# Patient Record
Sex: Male | Born: 1945
Health system: Southern US, Community
[De-identification: ages and names within clinical notes are randomized; demographics above are authoritative.]

## PROBLEM LIST (undated history)

## (undated) DIAGNOSIS — I10 Essential (primary) hypertension: Secondary | ICD-10-CM

## (undated) DIAGNOSIS — I2510 Atherosclerotic heart disease of native coronary artery without angina pectoris: Secondary | ICD-10-CM

## (undated) DIAGNOSIS — K219 Gastro-esophageal reflux disease without esophagitis: Secondary | ICD-10-CM

## (undated) DIAGNOSIS — K760 Fatty (change of) liver, not elsewhere classified: Secondary | ICD-10-CM

## (undated) DIAGNOSIS — E781 Pure hyperglyceridemia: Secondary | ICD-10-CM

## (undated) DIAGNOSIS — R972 Elevated prostate specific antigen [PSA]: Secondary | ICD-10-CM

## (undated) DIAGNOSIS — I200 Unstable angina: Secondary | ICD-10-CM

## (undated) DIAGNOSIS — E1110 Type 2 diabetes mellitus with ketoacidosis without coma: Secondary | ICD-10-CM

## (undated) DIAGNOSIS — E119 Type 2 diabetes mellitus without complications: Secondary | ICD-10-CM

## (undated) DIAGNOSIS — G709 Myoneural disorder, unspecified: Secondary | ICD-10-CM

## (undated) DIAGNOSIS — C61 Malignant neoplasm of prostate: Secondary | ICD-10-CM

## (undated) DIAGNOSIS — M1990 Unspecified osteoarthritis, unspecified site: Secondary | ICD-10-CM

## (undated) DIAGNOSIS — G8929 Other chronic pain: Secondary | ICD-10-CM

## (undated) DIAGNOSIS — Z923 Personal history of irradiation: Secondary | ICD-10-CM

## (undated) DIAGNOSIS — H04129 Dry eye syndrome of unspecified lacrimal gland: Secondary | ICD-10-CM

## (undated) DIAGNOSIS — M549 Dorsalgia, unspecified: Secondary | ICD-10-CM

## (undated) HISTORY — DX: Pure hyperglyceridemia: E78.1

## (undated) HISTORY — PX: CARDIAC SURGERY: SHX584

## (undated) HISTORY — PX: APPENDECTOMY: SHX54

## (undated) HISTORY — PX: PROSTATE BIOPSY: SHX241

---
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
## 2000-07-23 ENCOUNTER — Encounter: Payer: Self-pay | Admitting: Internal Medicine

## 2000-07-23 ENCOUNTER — Ambulatory Visit (HOSPITAL_COMMUNITY): Admission: RE | Admit: 2000-07-23 | Discharge: 2000-07-23 | Payer: Self-pay | Admitting: Internal Medicine

## 2001-07-25 ENCOUNTER — Ambulatory Visit (HOSPITAL_COMMUNITY): Admission: RE | Admit: 2001-07-25 | Discharge: 2001-07-25 | Payer: Self-pay | Admitting: Family Medicine

## 2001-07-25 ENCOUNTER — Encounter: Payer: Self-pay | Admitting: Internal Medicine

## 2001-09-07 ENCOUNTER — Emergency Department (HOSPITAL_COMMUNITY): Admission: EM | Admit: 2001-09-07 | Discharge: 2001-09-07 | Payer: Self-pay | Admitting: Emergency Medicine

## 2001-09-07 ENCOUNTER — Encounter: Payer: Self-pay | Admitting: Emergency Medicine

## 2002-07-11 ENCOUNTER — Encounter: Payer: Self-pay | Admitting: Internal Medicine

## 2002-07-11 ENCOUNTER — Ambulatory Visit (HOSPITAL_COMMUNITY): Admission: RE | Admit: 2002-07-11 | Discharge: 2002-07-11 | Payer: Self-pay | Admitting: Internal Medicine

## 2003-10-17 ENCOUNTER — Ambulatory Visit (HOSPITAL_COMMUNITY): Admission: RE | Admit: 2003-10-17 | Discharge: 2003-10-17 | Payer: Self-pay | Admitting: Internal Medicine

## 2003-11-03 ENCOUNTER — Emergency Department (HOSPITAL_COMMUNITY): Admission: EM | Admit: 2003-11-03 | Discharge: 2003-11-03 | Payer: Self-pay | Admitting: Family Medicine

## 2004-05-15 ENCOUNTER — Ambulatory Visit (HOSPITAL_COMMUNITY): Admission: RE | Admit: 2004-05-15 | Discharge: 2004-05-15 | Payer: Self-pay | Admitting: Internal Medicine

## 2008-01-24 ENCOUNTER — Ambulatory Visit (HOSPITAL_COMMUNITY): Admission: RE | Admit: 2008-01-24 | Discharge: 2008-01-24 | Payer: Self-pay | Admitting: Internal Medicine

## 2008-02-01 ENCOUNTER — Ambulatory Visit (HOSPITAL_COMMUNITY): Admission: RE | Admit: 2008-02-01 | Discharge: 2008-02-01 | Payer: Self-pay | Admitting: Internal Medicine

## 2008-02-02 ENCOUNTER — Ambulatory Visit (HOSPITAL_COMMUNITY): Admission: RE | Admit: 2008-02-02 | Discharge: 2008-02-02 | Payer: Self-pay | Admitting: Internal Medicine

## 2008-02-15 ENCOUNTER — Encounter: Admission: RE | Admit: 2008-02-15 | Discharge: 2008-02-15 | Payer: Self-pay | Admitting: Orthopedic Surgery

## 2008-02-19 ENCOUNTER — Encounter: Admission: RE | Admit: 2008-02-19 | Discharge: 2008-02-19 | Payer: Self-pay | Admitting: Orthopedic Surgery

## 2009-04-25 ENCOUNTER — Ambulatory Visit (HOSPITAL_COMMUNITY): Admission: RE | Admit: 2009-04-25 | Discharge: 2009-04-26 | Payer: Self-pay | Admitting: Internal Medicine

## 2009-10-12 ENCOUNTER — Encounter: Admission: RE | Admit: 2009-10-12 | Discharge: 2009-10-12 | Payer: Self-pay | Admitting: Orthopedic Surgery

## 2010-01-02 ENCOUNTER — Encounter (HOSPITAL_COMMUNITY): Admission: RE | Admit: 2010-01-02 | Discharge: 2010-03-18 | Payer: Self-pay | Admitting: Internal Medicine

## 2010-02-12 HISTORY — PX: COLONOSCOPY: SHX174

## 2010-02-12 LAB — HM COLONOSCOPY

## 2010-03-25 ENCOUNTER — Ambulatory Visit (HOSPITAL_COMMUNITY): Admission: RE | Admit: 2010-03-25 | Discharge: 2010-03-25 | Payer: Self-pay | Admitting: Internal Medicine

## 2010-07-10 ENCOUNTER — Encounter (HOSPITAL_COMMUNITY)
Admission: RE | Admit: 2010-07-10 | Discharge: 2010-09-13 | Payer: Self-pay | Source: Home / Self Care | Attending: Internal Medicine | Admitting: Internal Medicine

## 2011-01-09 ENCOUNTER — Institutional Professional Consult (permissible substitution): Payer: Self-pay | Admitting: Cardiology

## 2011-02-05 ENCOUNTER — Telehealth: Payer: Self-pay | Admitting: Cardiology

## 2011-02-05 NOTE — Telephone Encounter (Signed)
LOV faxed to [facility] @ [phone]  02/05/11/km

## 2011-02-05 NOTE — Telephone Encounter (Signed)
Their was no REcords faxed on this PT to [facility]/ PT has No Records  02/05/11/km

## 2011-02-09 ENCOUNTER — Emergency Department (HOSPITAL_COMMUNITY): Payer: BC Managed Care – PPO

## 2011-02-09 ENCOUNTER — Emergency Department (HOSPITAL_COMMUNITY)
Admission: EM | Admit: 2011-02-09 | Discharge: 2011-02-09 | Disposition: A | Discharge status: Home / Self Care | Payer: BC Managed Care – PPO | Attending: Emergency Medicine | Admitting: Emergency Medicine

## 2011-02-09 DIAGNOSIS — I10 Essential (primary) hypertension: Secondary | ICD-10-CM | POA: Insufficient documentation

## 2011-02-09 DIAGNOSIS — R143 Flatulence: Secondary | ICD-10-CM | POA: Insufficient documentation

## 2011-02-09 DIAGNOSIS — R142 Eructation: Secondary | ICD-10-CM | POA: Insufficient documentation

## 2011-02-09 DIAGNOSIS — R10819 Abdominal tenderness, unspecified site: Secondary | ICD-10-CM | POA: Insufficient documentation

## 2011-02-09 DIAGNOSIS — R109 Unspecified abdominal pain: Secondary | ICD-10-CM | POA: Insufficient documentation

## 2011-02-09 DIAGNOSIS — Q619 Cystic kidney disease, unspecified: Secondary | ICD-10-CM | POA: Insufficient documentation

## 2011-02-09 DIAGNOSIS — E119 Type 2 diabetes mellitus without complications: Secondary | ICD-10-CM | POA: Insufficient documentation

## 2011-02-09 DIAGNOSIS — M25519 Pain in unspecified shoulder: Secondary | ICD-10-CM | POA: Insufficient documentation

## 2011-02-09 DIAGNOSIS — R079 Chest pain, unspecified: Secondary | ICD-10-CM | POA: Insufficient documentation

## 2011-02-09 DIAGNOSIS — R141 Gas pain: Secondary | ICD-10-CM | POA: Insufficient documentation

## 2011-02-09 DIAGNOSIS — Z79899 Other long term (current) drug therapy: Secondary | ICD-10-CM | POA: Insufficient documentation

## 2011-02-09 LAB — URINALYSIS, ROUTINE W REFLEX MICROSCOPIC
Bilirubin Urine: NEGATIVE
Glucose, UA: 100 mg/dL — AB
Protein, ur: NEGATIVE mg/dL
Specific Gravity, Urine: 1.018 (ref 1.005–1.030)
pH: 6 (ref 5.0–8.0)

## 2011-02-09 LAB — COMPREHENSIVE METABOLIC PANEL
ALT: 44 U/L (ref 0–53)
Albumin: 3.7 g/dL (ref 3.5–5.2)
Alkaline Phosphatase: 71 U/L (ref 39–117)
BUN: 16 mg/dL (ref 6–23)
Chloride: 106 mEq/L (ref 96–112)
Total Bilirubin: 0.3 mg/dL (ref 0.3–1.2)
Total Protein: 7.1 g/dL (ref 6.0–8.3)

## 2011-02-09 LAB — DIFFERENTIAL
Eosinophils Relative: 2 % (ref 0–5)
Lymphocytes Relative: 32 % (ref 12–46)
Lymphs Abs: 2.5 10*3/uL (ref 0.7–4.0)
Neutro Abs: 4.5 10*3/uL (ref 1.7–7.7)

## 2011-02-09 LAB — CBC
Hemoglobin: 14 g/dL (ref 13.0–17.0)
MCHC: 33.7 g/dL (ref 30.0–36.0)
MCV: 87.6 fL (ref 78.0–100.0)
RBC: 4.74 MIL/uL (ref 4.22–5.81)
RDW: 12.5 % (ref 11.5–15.5)
WBC: 7.9 10*3/uL (ref 4.0–10.5)

## 2011-02-09 LAB — TROPONIN I: Troponin I: <0.3 ng/mL (ref <0.30)

## 2011-02-09 MED ORDER — IOHEXOL 300 MG/ML  SOLN
100.0000 mL | Freq: Once | INTRAMUSCULAR | Status: AC | PRN
  Administered 2011-02-09: 100 mL via INTRAVENOUS

## 2011-02-16 ENCOUNTER — Other Ambulatory Visit: Payer: Self-pay | Admitting: Internal Medicine

## 2011-02-16 DIAGNOSIS — R935 Abnormal findings on diagnostic imaging of other abdominal regions, including retroperitoneum: Secondary | ICD-10-CM

## 2011-02-21 ENCOUNTER — Ambulatory Visit
Admission: RE | Admit: 2011-02-21 | Discharge: 2011-02-21 | Disposition: A | Discharge status: Home / Self Care | Payer: BC Managed Care – PPO | Source: Ambulatory Visit | Attending: Internal Medicine | Admitting: Internal Medicine

## 2011-02-21 DIAGNOSIS — R935 Abnormal findings on diagnostic imaging of other abdominal regions, including retroperitoneum: Secondary | ICD-10-CM

## 2011-02-21 MED ORDER — GADOBENATE DIMEGLUMINE 529 MG/ML IV SOLN
15.0000 mL | Freq: Once | INTRAVENOUS | Status: AC | PRN
  Administered 2011-02-21: 15 mL via INTRAVENOUS

## 2011-06-24 ENCOUNTER — Other Ambulatory Visit (HOSPITAL_COMMUNITY): Payer: Self-pay | Admitting: Internal Medicine

## 2011-06-24 ENCOUNTER — Ambulatory Visit (HOSPITAL_COMMUNITY)
Admission: RE | Admit: 2011-06-24 | Discharge: 2011-06-24 | Disposition: A | Discharge status: Home / Self Care | Payer: BC Managed Care – PPO | Source: Ambulatory Visit | Attending: Internal Medicine | Admitting: Internal Medicine

## 2011-06-24 DIAGNOSIS — R072 Precordial pain: Secondary | ICD-10-CM

## 2011-06-24 DIAGNOSIS — R079 Chest pain, unspecified: Secondary | ICD-10-CM | POA: Insufficient documentation

## 2011-06-24 DIAGNOSIS — Z0000 Encounter for general adult medical examination without abnormal findings: Secondary | ICD-10-CM | POA: Insufficient documentation

## 2011-06-24 DIAGNOSIS — F17200 Nicotine dependence, unspecified, uncomplicated: Secondary | ICD-10-CM | POA: Insufficient documentation

## 2011-10-09 ENCOUNTER — Other Ambulatory Visit (HOSPITAL_COMMUNITY): Payer: Self-pay | Admitting: Internal Medicine

## 2011-10-09 DIAGNOSIS — R7989 Other specified abnormal findings of blood chemistry: Secondary | ICD-10-CM

## 2011-10-09 DIAGNOSIS — R5383 Other fatigue: Secondary | ICD-10-CM

## 2011-10-27 ENCOUNTER — Encounter (HOSPITAL_COMMUNITY)
Admission: RE | Admit: 2011-10-27 | Discharge: 2011-10-27 | Disposition: A | Discharge status: Home / Self Care | Payer: BC Managed Care – PPO | Source: Ambulatory Visit | Attending: Internal Medicine | Admitting: Internal Medicine

## 2011-10-27 DIAGNOSIS — R946 Abnormal results of thyroid function studies: Secondary | ICD-10-CM | POA: Insufficient documentation

## 2011-10-27 DIAGNOSIS — R7989 Other specified abnormal findings of blood chemistry: Secondary | ICD-10-CM

## 2011-10-27 DIAGNOSIS — R5383 Other fatigue: Secondary | ICD-10-CM

## 2011-10-27 DIAGNOSIS — R5381 Other malaise: Secondary | ICD-10-CM | POA: Insufficient documentation

## 2011-10-28 ENCOUNTER — Encounter (HOSPITAL_COMMUNITY)
Admission: RE | Admit: 2011-10-28 | Discharge: 2011-10-28 | Disposition: A | Discharge status: Home / Self Care | Payer: BC Managed Care – PPO | Source: Ambulatory Visit | Attending: Internal Medicine | Admitting: Internal Medicine

## 2011-10-28 MED ORDER — SODIUM PERTECHNETATE TC 99M INJECTION
10.5000 | Freq: Once | INTRAVENOUS | Status: AC | PRN
  Administered 2011-10-28: 10.5 via INTRAVENOUS

## 2011-10-28 MED ORDER — SODIUM IODIDE I 131 CAPSULE: 5.6000 | Freq: Once | INTRAVENOUS | Status: AC | PRN

## 2012-04-01 ENCOUNTER — Other Ambulatory Visit (HOSPITAL_COMMUNITY): Payer: Self-pay | Admitting: Internal Medicine

## 2012-04-01 DIAGNOSIS — R7989 Other specified abnormal findings of blood chemistry: Secondary | ICD-10-CM

## 2012-04-07 ENCOUNTER — Encounter (HOSPITAL_COMMUNITY)
Admission: RE | Admit: 2012-04-07 | Discharge: 2012-04-07 | Disposition: A | Discharge status: Home / Self Care | Payer: BC Managed Care – PPO | Source: Ambulatory Visit | Attending: Internal Medicine | Admitting: Internal Medicine

## 2012-04-07 DIAGNOSIS — R7989 Other specified abnormal findings of blood chemistry: Secondary | ICD-10-CM

## 2012-04-07 DIAGNOSIS — E0590 Thyrotoxicosis, unspecified without thyrotoxic crisis or storm: Secondary | ICD-10-CM | POA: Insufficient documentation

## 2012-04-08 ENCOUNTER — Encounter (HOSPITAL_COMMUNITY)
Admission: RE | Admit: 2012-04-08 | Discharge: 2012-04-08 | Disposition: A | Discharge status: Home / Self Care | Payer: BC Managed Care – PPO | Source: Ambulatory Visit | Attending: Internal Medicine | Admitting: Internal Medicine

## 2012-04-08 ENCOUNTER — Encounter (HOSPITAL_COMMUNITY): Payer: Self-pay

## 2012-04-08 MED ORDER — SODIUM PERTECHNETATE TC 99M INJECTION
11.0000 | Freq: Once | INTRAVENOUS | Status: AC | PRN
  Administered 2012-04-08: 11 via INTRAVENOUS

## 2012-04-08 MED ORDER — SODIUM IODIDE I 131 CAPSULE
8.0000 | Freq: Once | INTRAVENOUS | Status: AC | PRN
  Administered 2012-04-07: 8 via ORAL

## 2012-06-25 ENCOUNTER — Observation Stay (HOSPITAL_COMMUNITY)
Admission: EM | Admit: 2012-06-25 | Discharge: 2012-06-28 | DRG: 854 | Disposition: A | Discharge status: Home / Self Care | Payer: BC Managed Care – PPO | Attending: Internal Medicine | Admitting: Internal Medicine

## 2012-06-25 ENCOUNTER — Emergency Department (HOSPITAL_COMMUNITY): Payer: BC Managed Care – PPO

## 2012-06-25 ENCOUNTER — Encounter (HOSPITAL_COMMUNITY): Payer: Self-pay | Admitting: *Deleted

## 2012-06-25 DIAGNOSIS — E872 Acidosis, unspecified: Secondary | ICD-10-CM | POA: Diagnosis present

## 2012-06-25 DIAGNOSIS — K219 Gastro-esophageal reflux disease without esophagitis: Secondary | ICD-10-CM | POA: Diagnosis present

## 2012-06-25 DIAGNOSIS — Z23 Encounter for immunization: Secondary | ICD-10-CM

## 2012-06-25 DIAGNOSIS — I10 Essential (primary) hypertension: Secondary | ICD-10-CM | POA: Diagnosis present

## 2012-06-25 DIAGNOSIS — R079 Chest pain, unspecified: Secondary | ICD-10-CM

## 2012-06-25 DIAGNOSIS — E0590 Thyrotoxicosis, unspecified without thyrotoxic crisis or storm: Secondary | ICD-10-CM | POA: Diagnosis present

## 2012-06-25 DIAGNOSIS — E1169 Type 2 diabetes mellitus with other specified complication: Secondary | ICD-10-CM | POA: Diagnosis present

## 2012-06-25 DIAGNOSIS — Z9861 Coronary angioplasty status: Secondary | ICD-10-CM

## 2012-06-25 DIAGNOSIS — E785 Hyperlipidemia, unspecified: Secondary | ICD-10-CM

## 2012-06-25 DIAGNOSIS — I200 Unstable angina: Secondary | ICD-10-CM

## 2012-06-25 DIAGNOSIS — I2510 Atherosclerotic heart disease of native coronary artery without angina pectoris: Principal | ICD-10-CM

## 2012-06-25 DIAGNOSIS — Z7982 Long term (current) use of aspirin: Secondary | ICD-10-CM

## 2012-06-25 DIAGNOSIS — Z79899 Other long term (current) drug therapy: Secondary | ICD-10-CM

## 2012-06-25 DIAGNOSIS — Z955 Presence of coronary angioplasty implant and graft: Secondary | ICD-10-CM

## 2012-06-25 DIAGNOSIS — E119 Type 2 diabetes mellitus without complications: Secondary | ICD-10-CM | POA: Diagnosis present

## 2012-06-25 HISTORY — DX: Type 2 diabetes mellitus without complications: E11.9

## 2012-06-25 HISTORY — DX: Other chronic pain: G89.29

## 2012-06-25 HISTORY — DX: Dorsalgia, unspecified: M54.9

## 2012-06-25 HISTORY — DX: Essential (primary) hypertension: I10

## 2012-06-25 HISTORY — DX: Gastro-esophageal reflux disease without esophagitis: K21.9

## 2012-06-25 LAB — GLUCOSE, CAPILLARY: Glucose-Capillary: 286 mg/dL — ABNORMAL HIGH (ref 70–99)

## 2012-06-25 LAB — URINALYSIS, ROUTINE W REFLEX MICROSCOPIC
Glucose, UA: >1000 mg/dL — AB
Hgb urine dipstick: NEGATIVE
Leukocytes, UA: NEGATIVE
Protein, ur: NEGATIVE mg/dL
Specific Gravity, Urine: 1.019 (ref 1.005–1.030)
pH: 5.5 (ref 5.0–8.0)

## 2012-06-25 LAB — CBC
Hemoglobin: 14.8 g/dL (ref 13.0–17.0)
MCV: 85.7 fL (ref 78.0–100.0)
Platelets: 234 10*3/uL (ref 150–400)
RBC: 4.9 MIL/uL (ref 4.22–5.81)
WBC: 16.3 10*3/uL — ABNORMAL HIGH (ref 4.0–10.5)

## 2012-06-25 LAB — BASIC METABOLIC PANEL
Calcium: 9.8 mg/dL (ref 8.4–10.5)
Chloride: 101 mEq/L (ref 96–112)
GFR calc Af Amer: >90 mL/min (ref >90)
GFR calc Af Amer: 87 mL/min — ABNORMAL LOW (ref >90)
GFR calc non Af Amer: 75 mL/min — ABNORMAL LOW (ref >90)
Glucose, Bld: 343 mg/dL — ABNORMAL HIGH (ref 70–99)
Potassium: 4.4 mEq/L (ref 3.5–5.1)
Sodium: 135 mEq/L (ref 135–145)

## 2012-06-25 LAB — TROPONIN I: Troponin I: <0.3 ng/mL (ref <0.30)

## 2012-06-25 LAB — LIPID PANEL
Cholesterol: 195 mg/dL (ref 0–200)
LDL Cholesterol: 126 mg/dL — ABNORMAL HIGH (ref 0–99)
VLDL: 22 mg/dL (ref 0–40)

## 2012-06-25 LAB — HEPARIN LEVEL (UNFRACTIONATED): Heparin Unfractionated: 0.16 IU/mL — ABNORMAL LOW (ref 0.30–0.70)

## 2012-06-25 LAB — LACTIC ACID, PLASMA: Lactic Acid, Venous: 2.7 mmol/L — ABNORMAL HIGH (ref 0.5–2.2)

## 2012-06-25 LAB — PRO B NATRIURETIC PEPTIDE: Pro B Natriuretic peptide (BNP): 168.4 pg/mL — ABNORMAL HIGH (ref 0–125)

## 2012-06-25 LAB — POCT I-STAT TROPONIN I: Troponin i, poc: 0.04 ng/mL (ref 0.00–0.08)

## 2012-06-25 LAB — MAGNESIUM: Magnesium: 2 mg/dL (ref 1.5–2.5)

## 2012-06-25 LAB — URINE MICROSCOPIC-ADD ON

## 2012-06-25 LAB — PROTIME-INR: INR: 1.11 (ref 0.00–1.49)

## 2012-06-25 MED ORDER — SODIUM CHLORIDE 0.9 % IJ SOLN
3.0000 mL | Freq: Two times a day (BID) | INTRAMUSCULAR | Status: DC
  Administered 2012-06-25 – 2012-06-26 (×2): 3 mL via INTRAVENOUS

## 2012-06-25 MED ORDER — PANTOPRAZOLE SODIUM 40 MG PO TBEC
40.0000 mg | DELAYED_RELEASE_TABLET | Freq: Every day | ORAL | Status: DC
  Administered 2012-06-26 – 2012-06-28 (×3): 40 mg via ORAL
  Filled 2012-06-25 (×3): qty 1

## 2012-06-25 MED ORDER — INFLUENZA VIRUS VACC SPLIT PF IM SUSP
0.5000 mL | INTRAMUSCULAR | Status: AC
  Administered 2012-06-26: 0.5 mL via INTRAMUSCULAR
  Filled 2012-06-25: qty 0.5

## 2012-06-25 MED ORDER — DILTIAZEM HCL ER COATED BEADS 120 MG PO CP24
120.0000 mg | ORAL_CAPSULE | Freq: Every day | ORAL | Status: DC
  Administered 2012-06-25 – 2012-06-28 (×4): 120 mg via ORAL
  Filled 2012-06-25 (×3): qty 1

## 2012-06-25 MED ORDER — SODIUM CHLORIDE 0.9 % IV BOLUS (SEPSIS)
500.0000 mL | Freq: Once | INTRAVENOUS | Status: AC
  Administered 2012-06-25: 500 mL via INTRAVENOUS

## 2012-06-25 MED ORDER — ASPIRIN EC 81 MG PO TBEC
81.0000 mg | DELAYED_RELEASE_TABLET | Freq: Every day | ORAL | Status: DC
  Administered 2012-06-25 – 2012-06-26 (×2): 81 mg via ORAL
  Filled 2012-06-25 (×3): qty 1

## 2012-06-25 MED ORDER — HEPARIN BOLUS VIA INFUSION
4000.0000 [IU] | Freq: Once | INTRAVENOUS | Status: AC
  Administered 2012-06-25: 4000 [IU] via INTRAVENOUS
  Filled 2012-06-25: qty 4000

## 2012-06-25 MED ORDER — HEPARIN (PORCINE) IN NACL 100-0.45 UNIT/ML-% IJ SOLN
1500.0000 [IU]/h | INTRAMUSCULAR | Status: DC
  Administered 2012-06-25: 1500 [IU]/h via INTRAVENOUS
  Administered 2012-06-25: 1250 [IU]/h via INTRAVENOUS
  Administered 2012-06-27: 1500 [IU]/h via INTRAVENOUS
  Filled 2012-06-25 (×6): qty 250

## 2012-06-25 MED ORDER — NABUMETONE 750 MG PO TABS
750.0000 mg | ORAL_TABLET | Freq: Two times a day (BID) | ORAL | Status: DC
  Administered 2012-06-25 – 2012-06-27 (×4): 750 mg via ORAL
  Filled 2012-06-25 (×5): qty 1

## 2012-06-25 MED ORDER — ATENOLOL 100 MG PO TABS
100.0000 mg | ORAL_TABLET | Freq: Every day | ORAL | Status: DC
  Administered 2012-06-25 – 2012-06-28 (×4): 100 mg via ORAL
  Filled 2012-06-25 (×4): qty 1

## 2012-06-25 MED ORDER — ASPIRIN 81 MG PO TABS: 81.0000 mg | ORAL_TABLET | Freq: Every day | ORAL | Status: DC

## 2012-06-25 MED ORDER — DILTIAZEM HCL 60 MG PO TABS
120.0000 mg | ORAL_TABLET | Freq: Every day | ORAL | Status: DC
  Administered 2012-06-25: 120 mg via ORAL

## 2012-06-25 MED ORDER — ACETAMINOPHEN 650 MG RE SUPP: 650.0000 mg | Freq: Four times a day (QID) | RECTAL | Status: DC | PRN

## 2012-06-25 MED ORDER — ENOXAPARIN SODIUM 40 MG/0.4ML ~~LOC~~ SOLN
40.0000 mg | SUBCUTANEOUS | Status: DC
  Filled 2012-06-25: qty 0.4

## 2012-06-25 MED ORDER — INSULIN ASPART 100 UNIT/ML ~~LOC~~ SOLN
0.0000 [IU] | Freq: Three times a day (TID) | SUBCUTANEOUS | Status: DC
  Administered 2012-06-25 – 2012-06-26 (×3): 5 [IU] via SUBCUTANEOUS
  Administered 2012-06-26 – 2012-06-27 (×3): 3 [IU] via SUBCUTANEOUS
  Administered 2012-06-28: 2 [IU] via SUBCUTANEOUS

## 2012-06-25 MED ORDER — ATORVASTATIN CALCIUM 80 MG PO TABS
80.0000 mg | ORAL_TABLET | Freq: Every day | ORAL | Status: DC
  Administered 2012-06-25 – 2012-06-27 (×3): 80 mg via ORAL
  Filled 2012-06-25 (×5): qty 1

## 2012-06-25 MED ORDER — HEPARIN BOLUS VIA INFUSION
3000.0000 [IU] | Freq: Once | INTRAVENOUS | Status: AC
  Administered 2012-06-25: 3000 [IU] via INTRAVENOUS
  Filled 2012-06-25: qty 3000

## 2012-06-25 MED ORDER — DILTIAZEM HCL 60 MG PO TABS: 120.0000 mg | ORAL_TABLET | Freq: Every day | ORAL | Status: DC

## 2012-06-25 MED ORDER — SODIUM CHLORIDE 0.9 % IJ SOLN: 3.0000 mL | Freq: Two times a day (BID) | INTRAMUSCULAR | Status: DC

## 2012-06-25 MED ORDER — DILTIAZEM HCL 60 MG PO TABS
120.0000 mg | ORAL_TABLET | Freq: Four times a day (QID) | ORAL | Status: DC
  Filled 2012-06-25 (×3): qty 2

## 2012-06-25 MED ORDER — ACETAMINOPHEN 325 MG PO TABS: 650.0000 mg | ORAL_TABLET | Freq: Four times a day (QID) | ORAL | Status: DC | PRN

## 2012-06-25 MED ORDER — NITROGLYCERIN 0.4 MG SL SUBL: 0.4000 mg | SUBLINGUAL_TABLET | SUBLINGUAL | Status: DC | PRN

## 2012-06-25 MED ORDER — MORPHINE SULFATE 2 MG/ML IJ SOLN: 1.0000 mg | INTRAMUSCULAR | Status: DC | PRN

## 2012-06-25 MED ORDER — MONTELUKAST SODIUM 10 MG PO TABS
10.0000 mg | ORAL_TABLET | Freq: Every day | ORAL | Status: DC
  Administered 2012-06-25 – 2012-06-27 (×3): 10 mg via ORAL
  Filled 2012-06-25 (×5): qty 1

## 2012-06-25 MED ORDER — ASPIRIN 81 MG PO CHEW
324.0000 mg | CHEWABLE_TABLET | Freq: Once | ORAL | Status: AC
  Administered 2012-06-25: 324 mg via ORAL
  Filled 2012-06-25: qty 4

## 2012-06-25 MED ORDER — ENALAPRIL MALEATE 20 MG PO TABS
20.0000 mg | ORAL_TABLET | Freq: Every day | ORAL | Status: DC
  Administered 2012-06-25 – 2012-06-28 (×4): 20 mg via ORAL
  Filled 2012-06-25 (×4): qty 1

## 2012-06-25 NOTE — Consult Note (Addendum)
Admit date: 06/25/2012 Referring Physician Dr. Loistine Chance Primary Physician No primary provider on file. Primary Cardiologist  None Reason for Consultation  Chest pain  HPI: 66 -year-old male with diabetes, hypertension, GERD who recently quit smoking last year who presents the emergency room with chest discomfort. For the past few months he is noted increasing exertional chest discomfort which is substernal/upper epigastric in location with no radiation of symptoms but brought on by heavy/moderate exertion. For instance he states that he is unable to walk a city block without development of these symptoms. After resting, the symptoms subside. He had symptoms yesterday after working on his boat and they consistently persisted throughout the evening and was quite severe. He was quite concerned about this. Came to the emergency room for further evaluation. Currently he is chest pain-free. His EKG shows sinus rhythm without any ST segment changes. His first set of troponin was normal. Approximally 7 years ago he had a stress test done. His primary physician has sent in a referral for a cardiology evaluation he states.    PMH:   Past Medical History  Diagnosis Date  . Diabetes mellitus without complication   . Hypertension   . GERD (gastroesophageal reflux disease)   . Chronic back pain     PSH:   Past Surgical History  Procedure Date  . Appendectomy    Allergies:  Review of patient's allergies indicates no known allergies. Prior to Admit Meds:   (Not in a hospital admission) Fam HX:    Family History  Problem Relation Age of Onset  . Diabetes Sister    Social HX:    History   Social History  . Marital Status: Married    Spouse Name: N/A    Number of Children: 4  . Years of Education: 2 y colleg   Occupational History  . 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 Smoking status: Former Smoker -- 50 years    Quit date: 09/26/2011  . Smokeless tobacco: Not on  file  . Alcohol Use: No  . Drug Use: No  . Sexually Active: Not on file   Other Topics Concern  . Not on file   Social History Narrative   Works as      ROS:  Denies any fevers, chills, syncope, orthopnea. Increasing angina with exertion. All 11 ROS were addressed and are negative except what is stated in the HPI  Physical Exam: Blood pressure 115/76, pulse 61, temperature 97.7 F (36.5 C), temperature source Oral, resp. rate 20, height 5\' 10"  (1.778 m), weight 91.627 kg (202 lb), SpO2 99.00%.    General: Well developed, well nourished, in no acute distress Head: Eyes PERRLA, No xanthomas.   Normal cephalic and atramatic  Lungs:   Clear bilaterally to auscultation and percussion. Normal respiratory effort. No wheezes, no rales. Heart:   HRRR S1 S2 Pulses are 2+ & equal. No appreciable murmurs, somewhat distant heart sounds    No carotid bruit. No JVD.  No abdominal bruits. No femoral bruits. Abdomen: Bowel sounds are positive, abdomen soft and non-tender without masses. No hepatosplenomegaly. Obese, somewhat distended Msk:  Back normal. Normal strength and tone for age. Extremities:   No clubbing, cyanosis or edema.  DP +1 Neuro: Alert and oriented X 3, non-focal, MAE x 4 GU: Deferred Rectal: Deferred Psych:  Good affect, responds appropriately    Labs:   Lab Results  Component Value Date   WBC 16.3* 06/25/2012   HGB 14.8 06/25/2012  HCT 42.0 06/25/2012   MCV 85.7 06/25/2012   PLT 234 06/25/2012    Lab 06/25/12 0725  NA 137  K 4.4  CL 101  CO2 18*  BUN 15  CREATININE 0.90  CALCIUM 10.2  PROT --  BILITOT --  ALKPHOS --  ALT --  AST --  GLUCOSE 245*   No results found for this basename: PTT   No results found for this basename: INR, PROTIME   Troponin thus far normal.    Radiology:  Dg Chest Port 1 View  06/25/2012  *RADIOLOGY REPORT*  Clinical Data: Chest pain and shortness of breath  PORTABLE CHEST - 1 VIEW  Comparison: 06/24/2011  Findings: The  cardiomediastinal silhouette is within normal limits. Lungs are clear.  No pneumothorax.  IMPRESSION: Negative.   Original Report Authenticated By: Donavan Burnet, M.D.    Personally viewed.  EKG:  Sinus rhythm, no ST segment changes. Personally viewed.   ASSESSMENT/PLAN:   66 year old male with diabetes, hypertension, recent tobacco cessation with history concerning for unstable angina /progressive angina on exertion.  1. Angina-given his history, risk factors, symptoms of progressive worsening exertional chest discomfort relieved with rest I would like to proceed with cardiac catheterization to determine if he has coronary artery disease. I discussed procedure with him. I discussed risks and benefits including stroke, heart attack, death, bleeding. He understands and is willing to proceed. We'll place him on the board for Monday. If he has any worrisome symptoms throughout the weekend, we will proceed with catheterization on a more urgent basis. I will go ahead and order echocardiogram.  I would recommend IV heparin, continuation of beta blocker, statin, aspirin. Nitroglycerin as needed.  2. Hypertension-continue with multidrug regimen that he has been on since an outpatient. This includes ACE inhibitor.  3. Mildly decreased CO2-18-could there be evidence of a metabolic acidosis, per primary team.  4. Mild leukocytosis-this may be secondary to recent prednisone administration.  We will continue to follow.  Donato Schultz, MD  06/25/2012  1:15 PM

## 2012-06-25 NOTE — ED Notes (Signed)
Patient reports he has noted shortness of breath, tightness, and chest pain with activity since last month.  Patient states it has gotten worse,  He states on yesterday morning,  He had onset of tightness in his chest after walking up steps and the pain persisted.  Patient states he was up all night due to pain.  He states he feels more sore in his chest than anything.  Patient denies nausea, but reports diaphoresis last night

## 2012-06-25 NOTE — Progress Notes (Signed)
Medical Student Hospital Admission Note Date: 06/25/2012  Patient name: Michael Guzman Medical record number: [id number] Date of birth: 31-Oct-1945 Age: 66 y.o. Gender: male PCP: No primary provider on file.  Medical Service: Internal Medicine Teaching Service  Attending physician:      Chief Complaint: Chest pain  History of Present Illness: 66 yo African American male w/ hx of HTN, diabetes, and GERD presents in the ED with severe chest pain of one day. This chest "tightness" was initiated after he started working on his boat yesterday and continued even after he ceased activity. The pain is limited to his mid-epigastric up to his mid-sternum and does not radiate. He says the pain is a 9/10 and although the pain has lessened to 2/10 today, it still feels sore. Associated symptoms include shortness of breath and diaphoresis. He endorses a 2-3 month history of progressive SOB and chest tightness on activity, both of which are relieved by rest. During this period, he also endorses fatigue, some lightheadedness upon standing up, and some increased nasal congestion. He denies nausea or vomiting, pain with inspiration, pain with intake of food, recent calf pain, orthopnea, PND, or change in medications. New medications were prescribed to him by his PCP on Thursday 10/10 and include predisone and levaquin for suspected infectious process. Otherwise, he has taken nothing to help relieve the pain. He denies sick contacts, recent travel, recent illness, or exposure to pets. He is a former smoker with a 50 pack year history and quit 10 months ago. Patient had a cardiac stress test 7 years ago, results unknown.  Meds: Current Outpatient Rx  Name Route Sig Dispense Refill  . ASPIRIN 81 MG PO TABS Oral Take 81 mg by mouth daily.    . ATENOLOL 100 MG PO TABS Oral Take 100 mg by mouth daily.    Marland Kitchen VITAMIN D PO Oral Take 1 tablet by mouth daily.    Marland Kitchen DILTIAZEM HCL 120 MG PO TABS Oral Take 120 mg by mouth 4  (four) times daily.    . ENALAPRIL MALEATE 20 MG PO TABS Oral Take 20 mg by mouth daily.    Marland Kitchen FLAXSEED (LINSEED) 1000 MG PO CAPS Oral Take 1,000 mg by mouth daily.    . GLYBURIDE 5 MG PO TABS Oral Take 5 mg by mouth 2 (two) times daily with a meal.    . LEVOFLOXACIN 500 MG PO TABS Oral Take 500 mg by mouth daily. Started 10 days dose on 06-23-12 for infection    . MAGNESIUM 250 MG PO TABS Oral Take 250 mg by mouth daily.    Marland Kitchen MONTELUKAST SODIUM 10 MG PO TABS Oral Take 10 mg by mouth at bedtime.    Marland Kitchen NABUMETONE 750 MG PO TABS Oral Take 750 mg by mouth 2 (two) times daily.    Marland Kitchen FISH OIL PO Oral Take 1 tablet by mouth daily.    Marland Kitchen PANTOPRAZOLE SODIUM 40 MG PO TBEC Oral Take 40 mg by mouth daily.    Marland Kitchen PREDNISONE 20 MG PO TABS Oral Take 20-60 mg by mouth daily. Started 06-23-12 take 3 tablets for 2 days, 2 tablets for 2 days and then 1 tablet for 5 days.    Marland Kitchen SITAGLIPTIN-METFORMIN HCL 50-1000 MG PO TABS Oral Take 1 tablet by mouth 2 (two) times daily with a meal.      Allergies: Allergies as of 06/25/2012  . (No Known Allergies)   Past Medical History  Diagnosis Date  . Diabetes mellitus without complication   .  Hypertension   . GERD (gastroesophageal reflux disease)   . Chronic back pain    Past Surgical History  Procedure Date  . Appendectomy    Family History  Problem Relation Age of Onset  . Diabetes Sister    History   Social History  . Marital Status: Married    Spouse Name: N/A    Number of Children: 4  . Years of Education: 2 y colleg   Occupational History  . 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 Smoking status: Former Smoker -- 50 years    Quit date: 09/26/2011  . Smokeless tobacco: Not on file  . Alcohol Use: No  . Drug Use: No  . Sexually Active: Not on file   Other Topics Concern  . Not on file   Social History Narrative   Works as     Review of Systems: Constitutional: negative for anorexia, chills and fevers Eyes:  negative for visual disturbance Ears, nose, mouth, throat, and face: negative for ear drainage, earaches and hearing loss Respiratory: negative for cough and wheezing Cardiovascular: negative for irregular heart beat, palpitations and syncope Gastrointestinal: negative for abdominal pain, change in bowel habits, constipation, diarrhea and vomiting Genitourinary:negative for decreased stream, dysuria, frequency and urinary incontinence Musculoskeletal:negative for arthralgias, bone pain and muscle weakness Neurological: positive for headaches, negative for coordination problems, gait problems, memory problems, seizures, vertigo and weakness Endocrine: negative for diabetic symptoms including blurry vision, poor wound healing, pruritus and weight loss Allergic/Immunologic: negative  Physical Exam: Blood pressure 115/76, pulse 61, temperature 97.7 F (36.5 C), temperature source Oral, resp. rate 20, height 5\' 10"  (1.778 m), weight 91.627 kg (202 lb), SpO2 99.00%. General appearance: alert, cooperative, appears stated age and no distress Eyes: conjunctivae/corneas clear. PERRL, EOM's intact. Fundi benign. Ears: normal TM's and external ear canals both ears Throat: lips, mucosa, and tongue normal; teeth and gums normal Neck: no adenopathy, no carotid bruit, no JVD, supple, symmetrical, trachea midline and thyroid not enlarged, symmetric, no tenderness/mass/nodules Lungs: clear to auscultation bilaterally Heart: regular rate and rhythm, S1, S2 normal, no murmur, click, rub or gallop Abdomen: soft, non-tender; bowel sounds normal; no masses,  no organomegaly Extremities: no edema, redness or tenderness in the calves or thighs Pulses: 2+ and symmetric Skin: Skin color, texture, turgor normal. No rashes or lesions Neurologic: Mental status: Alert, oriented, thought content appropriate Cranial nerves: I: sense of smell present, II: pupils equal, round, reactive to light and accommodation, III,IV,VI:  extraocular muscles extra-ocular motions intact, V: mastication normal, V: facial light touch sensation normal bilaterally, VII: upper facial muscle function normal bilaterally, VII: lower facial muscle function normal bilaterally, XI: trapezius strength normal bilaterally, XI: sternocleidomastoid strength normal bilaterally, XI: neck flexion strength normal, XII: tongue strength normal  Sensory: normal Motor: grossly normal Coordination: finger to nose normal bilaterally   Imaging results:  Dg Chest Port 1 View  06/25/2012  *RADIOLOGY REPORT*  Clinical Data: Chest pain and shortness of breath  PORTABLE CHEST - 1 VIEW  Comparison: 06/24/2011  Findings: The cardiomediastinal silhouette is within normal limits. Lungs are clear.  No pneumothorax.  IMPRESSION: Negative.   Original Report Authenticated By: Donavan Burnet, M.D.     Other results: EKG: normal sinus rate and rhythm, no ST changes. Compared with prior EKG in May 2012 which was also normal  Assessment & Plan by Problem: 66 yo [place] American male w/ hx of HTN, diabetes, and GERD is admitted for chest pain.  1) Chest pain - Patient presents with history and symptoms consistent with unstable angina. Patient had negative EKG findings and initial set of troponin was negative making other NSTEMI or STEMI unlikely. CXR negative for pneumothorax or mediastinal widening. Elevated pro-BNP at 168.4 is also suggestive of heart failure and the patient presents with 2-3 mo history of SOB w/ activity and fatigue, although other s/s of CHF negative (negative orthopnea, PND, lack of JVD, S3, S4 on exam). Patient does not present with renal dysfunction, which can elevate pro-BNP. Other etiologies for chest pain in this patient include reflux (however he says his pain is not associated with meals and it feels different) vs. MSK (not relieved by rest, not reproduceable by palpation) vs. PE (sedentary lifestyle but no s/s such as pleuritic chest pain, cough,  hemotypsis, crackles, tachycardia, increased respiratory rate - Well's score <1). Cardiology has been consulted and appreciate their consult. Patient will undergo cardiac cath on Monday, 10/14. Appreciate their consult. --Admit to telemetry. Cycle troponin, f/u 2D echo --Start IV heparin, statin, aspirin. Nitroglycerin PRN. Continue home atenolol --Risk stratification: f/u lipid panel  2) Leukocytosis - Likely 2/2 to recent prednisone. Low clinical suspicion for infection or acute inflammatory process.  3) Metabolic acidosis - patient has a gap of 18, delta gap of 1. Urine negative for ketones. Denies alcohol use or recent drug ingestions. Differential includes lactic acidosis 2/2 to tissue hypoperfusion vs metformin use (although patient doesn't exhibit symptoms of anorexia, N/V, abd pain, lethargy or has impaired renal function or concurrent liver disease).  --f/u lactate, BMP.  3) Hypertension: Continue enalapril, diltiazem at home doses  3) Diabetes. F/u HbA1c Hold home medications. --SSI  4) GERD: Continue home regimen, pantoprazole.  5) PPX: DVT: IV heparin  PUD: pantoprazole  Wynelle Link, MS4  This is a Psychologist, occupational Note.  The care of the patient was discussed with Dr. Loistine Chance and the assessment and plan was formulated with their assistance.  Please see their note for official documentation of the patient encounter.   Signed: Wynelle Link 06/25/2012, 12:28 PM   I have reviewed and agree with Medical student Wynelle Link. Please see my separate note .   Almyra Deforest, MD

## 2012-06-25 NOTE — H&P (Signed)
Hospital Admission Note Date: 06/25/2012  Patient name: Michael Guzman Medical record number: [id number] Date of birth: 24-Jan-1946 Age: 66 y.o. Gender: male PCP: Dr Lucky Cowboy   Medical Service: Internal Medicine Teaching   Attending physician: Dr Orvan Falconer     1st Contact: Wynelle Link   Pager: [phone] 2nd Contact: Dr Loistine Chance   Pager: [phone]  After 5 pm or weekends: 1st Contact:      Pager: [phone] 2nd Contact:      Pager: [phone]  Chief Complaint: Chest pain  History of Present Illness: This is a 66 year old african American  male with PMH significant for HTN, Diabetes, and GERD who presented to the ED with chest pain. Patient reports that since more then he had been experiencing chest "tightness" which had been progressively getting worse since one month. He reports an event when he was in [place] where he had to stop at least 3 times to walk 3 blocks because he felt short winded. Since then he noticed symptoms with minimal exertion including using steps or walking on ground level. The pain is a feeling of tightness, pressure like and sometimes a burning feeling. It is localized in the mid sternum without radiation. It is associated with SOB and diaphoresis . Last night he reported that he was working on his boat and start to experience again similar symptoms but it got progressively worse with 9/10 in severity. It was releaved in the ED after taking Aspirin. When evaluated by [place] he noted that it still feels kind of soar.  He further reports about some dizziness/ lightheadedness which is mostly when stands up. Denies any headache, vision changes, weakness or numbness or any episode of LOC. There are days when he experience it at least 6 times a day.   Patient follows up with his PCP on a regular basis. Was last evaluated on Thursday for persistent rhinorrhea and was prescribed prednisone and Levaquin .ENT evaluated him prior to that and per patient did not need any further  evaluation and management.   Screening: Colonoscopy within one year   Meds: Current Outpatient Rx  Name Route Sig Dispense Refill  . ASPIRIN 81 MG PO TABS Oral Take 81 mg by mouth daily.    . ATENOLOL 100 MG PO TABS Oral Take 100 mg by mouth daily.    Marland Kitchen VITAMIN D PO Oral Take 1 tablet by mouth daily.    Marland Kitchen DILTIAZEM HCL 120 MG PO TABS Oral Take 120 mg by mouth 4 (four) times daily.    . ENALAPRIL MALEATE 20 MG PO TABS Oral Take 20 mg by mouth daily.    Marland Kitchen FLAXSEED (LINSEED) 1000 MG PO CAPS Oral Take 1,000 mg by mouth daily.    . GLYBURIDE 5 MG PO TABS Oral Take 5 mg by mouth 2 (two) times daily with a meal.    . LEVOFLOXACIN 500 MG PO TABS Oral Take 500 mg by mouth daily. Started 10 days dose on 06-23-12 for infection    . MAGNESIUM 250 MG PO TABS Oral Take 250 mg by mouth daily.    Marland Kitchen MONTELUKAST SODIUM 10 MG PO TABS Oral Take 10 mg by mouth at bedtime.    Marland Kitchen NABUMETONE 750 MG PO TABS Oral Take 750 mg by mouth 2 (two) times daily.    Marland Kitchen FISH OIL PO Oral Take 1 tablet by mouth daily.    Marland Kitchen PANTOPRAZOLE SODIUM 40 MG PO TBEC Oral Take 40 mg by mouth daily.    Marland Kitchen  PREDNISONE 20 MG PO TABS Oral Take 20-60 mg by mouth daily. Started 06-23-12 take 3 tablets for 2 days, 2 tablets for 2 days and then 1 tablet for 5 days.    Marland Kitchen SITAGLIPTIN-METFORMIN HCL 50-1000 MG PO TABS Oral Take 1 tablet by mouth 2 (two) times daily with a meal.      Allergies: Allergies as of 06/25/2012  . (No Known Allergies)   Past Medical History  Diagnosis Date  . Diabetes mellitus without complication   . Hypertension   . GERD (gastroesophageal reflux disease)    Past Surgical History  Procedure Date  . Appendectomy    No family history on file. History   Social History  . Marital Status: Married    Spouse Name: N/A    Number of Children: N/A  . Years of Education: N/A   Occupational History  . Not on file.   Social History Main Topics  . Smoking status: Former Games developer  . Smokeless tobacco: Not on file  .  Alcohol Use: No  . Drug Use: No  . Sexually Active:    Other Topics Concern  . Not on file   Social History Narrative  . No narrative on file    Review of Systems Bold if positive Constitutional:  fever, chills, diaphoresis, appetite change and fatigue.  HEENT:   hearing loss, ear pain, congestion, sore throat, rhinorrhea,  mouth sores, neck pain, neck stiffness and tinnitus.   Respiratory:  SOB, DOE, cough, chest tightness,  and wheezing.   Cardiovascular: chest pain, palpitations and leg swelling.  Gastrointestinal:  nausea, vomiting, abdominal pain, diarrhea, constipation, blood in stool and abdominal distention.  Genitourinary:  dysuria, urgency, frequency, hematuria, flank pain and difficulty urinating.  Skin:  pallor, rash and wound.  Neurological:dizziness,syncope, weakness, light-headedness, numbness and headaches.    Physical Exam: Blood pressure 108/68, pulse 66, temperature 97.7 F (36.5 C), temperature source Oral, resp. rate 14, height 5\' 10"  (1.778 m), weight 202 lb (91.627 kg), SpO2 100.00%.  Constitutional: Vital signs reviewed.  Patient is a well-developed and well-nourished  in no acute distress and cooperative with exam. Alert and oriented x3.  Ear: TM normal bilaterally Mouth: no erythema or exudates, MMM Eyes: PERRL, EOMI, conjunctivae normal, No scleral icterus.  Neck: Supple,  Cardiovascular: RRR, S1 normal, S2 normal, no MRG, pulses symmetric and intact bilaterally Pulmonary/Chest: CTAB, no wheezes, rales, or rhonchi Abdominal: Soft. Non-tender, non-distended, bowel sounds are normal, no masses, organomegaly, or guarding present.  GU: no CVA tenderness Hematology: no cervical adenopathy.  Neurological: A&O x3, Strenght is normal and symmetric bilaterally, cranial nerve II-XII are grossly intact, no focal motor deficit, sensory intact to light touch bilaterally.  Skin: Warm, dry and intact. No rash, cyanosis, or clubbing.   Lab results: Basic Metabolic  Panel:  [facility] 06/25/12 0725  NA 137  K 4.4  CL 101  CO2 18*  GLUCOSE 245*  BUN 15  CREATININE 0.90  CALCIUM 10.2  MG --  PHOS --    CBC:  Basename 06/25/12 0725  WBC 16.3*  NEUTROABS --  HGB 14.8  HCT 42.0  MCV 85.7  PLT 234   BNP:  Basename 06/25/12 0726  PROBNP 168.4*    Imaging results:  Dg Chest Port 1 View  06/25/2012  *RADIOLOGY REPORT*  Clinical Data: Chest pain and shortness of breath  PORTABLE CHEST - 1 VIEW  Comparison: 06/24/2011  Findings: The cardiomediastinal silhouette is within normal limits. Lungs are clear.  No pneumothorax.  IMPRESSION:  Negative.   Original Report Authenticated By: Donavan Burnet, M.D.      Assessment & Plan by Problem: 66 yo [place] American male w/ hx of HTN, diabetes, and GERD is admitted for chest pain.   1) Chest pain - concerning for unstable angina considering history and symptoms.  Initial ECG was negative for acute ischemic changes and first set of Troponin was negative. Nevertheless the symptoms are concerning. Other DD include pneumothorax, pneumonia and Aortic dissection but CXray is negative for any acute process. PE is highly unlikely considering Geneva score of 0. GERD is certainly is a possibility since patient complains about burning sensation and he has a history of GERD but the pain  is not associated with meals.  MSK although again unlikely with no chest wall tenderness on palpation, not worse with deep inspiration.  Patient was found to have an elevated pro -BNP of 168.4 ( no renal function noted) and in consideration of SOB heart failure is a possibility. There are no signs fluid overload on  Physical exam or Cxray.  Cardiology was consulted who recommended Heparin drip, statins, aspirin and betablocker and cath on Monday if no acute changes occur. - Will admit to telemetry  - Will cycle troponin - Will obtain 2 D echo - Will continue Atenolol an start patient on Lipitor 80 mg - Will risk stratify with Lipid  panel and Hgb A1c  - Nitroglycerin PRN  2. Metabolic acidosis with anion gab of 18 and delta/delta gap of 1. Unclear etiology . U/A negative for ketones. Glucose level 245. No signs of infection except mild Leukocytosis. No signs of hypoperfusion. BP stable.  No history of ingestion. Patient does take aspirin every day  - will repeat Bmet and will obtain Lactic acid  3. Leukocytosis - unclear etiology. Stress induced or due to recent induction of prednisone.  No signs of infection. Cxray and U/A. Afebrile.  - Will continue to monitor - Will d/c prednisone  4. Hypertension - blood pressure stable -Continue enalapril, diltiazem at home doses   5.  Diabetes - hold home medication including Janumet and glyburide  - F/u HbA1c  - Will start on SSI   6. GERD: Continue home regimen, pantoprazole.   7. DVT PPX: Heparin drip    Signed: Shalita Notte 06/25/2012, 11:19 AM

## 2012-06-25 NOTE — Progress Notes (Signed)
  Echocardiogram 2D Echocardiogram has been performed.  Montie Swiderski 06/25/2012, 3:33 PM

## 2012-06-25 NOTE — Progress Notes (Signed)
ANTICOAGULATION CONSULT NOTE - Initial Consult  Pharmacy Consult for Heparin Indication: ACS/STEMI  No Known Allergies  Patient Measurements: Height: 5\' 10"  (177.8 cm) Weight: 202 lb (91.627 kg) IBW/kg (Calculated) : 73  Heparin Dosing Weight: 91.6 kg  Vital Signs: Temp: 98 F (36.7 C) (10/12 1336) Temp src: Oral (10/12 1336) BP: 134/90 mmHg (10/12 1336) Pulse Rate: 63  (10/12 1336)  Labs:  Basename 06/25/12 1233 06/25/12 0725  HGB -- 14.8  HCT -- 42.0  PLT -- 234  APTT -- --  LABPROT -- --  INR -- --  HEPARINUNFRC -- --  CREATININE -- 0.90  CKTOTAL -- --  CKMB -- --  TROPONINI <0.30 --    Estimated Creatinine Clearance: 93.1 ml/min (by C-G formula based on Cr of 0.9).   Medical History: Past Medical History  Diagnosis Date  . Diabetes mellitus without complication   . Hypertension   . GERD (gastroesophageal reflux disease)   . Chronic back pain     Medications:  Scheduled:    . aspirin  324 mg Oral Once  . aspirin EC  81 mg Oral Daily  . atenolol  100 mg Oral Daily  . atorvastatin  80 mg Oral q1800  . diltiazem  120 mg Oral QID  . enalapril  20 mg Oral Daily  . influenza  inactive virus vaccine  0.5 mL Intramuscular Tomorrow-1000  . montelukast  10 mg Oral QHS  . nabumetone  750 mg Oral BID  . pantoprazole  40 mg Oral Daily  . sodium chloride  500 mL Intravenous Once  . sodium chloride  3 mL Intravenous Q12H  . sodium chloride  3 mL Intravenous Q12H  . DISCONTD: aspirin  81 mg Oral Daily  . DISCONTD: enoxaparin (LOVENOX) injection  40 mg Subcutaneous Q24H    Assessment: 66 yr old male on heparin for chest pain.   Goal of Therapy:  Heparin level 0.3-0.7 units/ml Monitor platelets by anticoagulation protocol: Yes   Plan:  D/C Lovenox Heparin 4000 units IV bolus x1, then run heparin drip at 1250 units/hr. Check heparin 6 hours after drip start. Daily heparin level/CBC.  Wendie Simmer, PharmD, BCPS Clinical Pharmacist  Pager:  [phone]   Marylouise Stacks 06/25/2012,2:57 PM

## 2012-06-25 NOTE — ED Provider Notes (Signed)
History     CSN: [id number]  Arrival date & time 06/25/12  0704   First MD Initiated Contact with Patient 06/25/12 [phone]      Chief Complaint  Patient presents with  . Chest Pain    (Consider location/radiation/quality/duration/timing/severity/associated sxs/prior treatment) HPI Pt presents with c/o chest pain and tightness over the past month intermittently, but this has been worsening and now he feels chest tightness with shortness of breath when walking several steps.  He feels he has to stop and catch his breath with minimal exertion.  Last night he had pain all night- stopped at 5am he states.  Denies leg swelling, no fever or cough.  No diaphoresis or nausea associated.  There are no other associated systemic symptoms, there are no other alleviating or modifying factors.   Past Medical History  Diagnosis Date  . Diabetes mellitus without complication   . Hypertension   . GERD (gastroesophageal reflux disease)   . Chronic back pain     Past Surgical History  Procedure Date  . Appendectomy     Family History  Problem Relation Age of Onset  . Diabetes Sister     History  Substance Use Topics  . Smoking status: Former Smoker -- 50 years    Quit date: 07/27/2011  . Smokeless tobacco: Never Used  . Alcohol Use: No      Review of Systems ROS reviewed and all otherwise negative except for mentioned in HPI  Allergies  Review of patient's allergies indicates no known allergies.  Home Medications  No current outpatient prescriptions on file.  BP 113/71  Pulse 61  Temp 98.1 F (36.7 C) (Oral)  Resp 18  Ht 5\' 10"  (1.778 m)  Wt 202 lb (91.627 kg)  BMI 28.98 kg/m2  SpO2 99% Vitals reviewed Physical Exam Physical Examination: General appearance - alert, well appearing, and in no distress Mental status - alert, oriented to person, place, and time Mouth - mucous membranes moist, pharynx normal without lesions Chest - clear to auscultation, no wheezes, rales or  rhonchi, symmetric air entry, no reproducible tenderness Heart - normal rate, regular rhythm, normal S1, S2, no murmurs, rubs, clicks or gallops Abdomen - soft, nontender, nondistended, no masses or organomegaly Extremities - peripheral pulses normal, no pedal edema, no clubbing or cyanosis Skin - normal coloration and turgor, no rashe  ED Course  Procedures (including critical care time)  9:43 AM  Discussed with [facility] resident, she will see patient in the ED for admission.     Date: 06/26/2012  Rate: 84  Rhythm: normal sinus rhythm  QRS Axis: normal  Intervals: normal  ST/T Wave abnormalities: normal  Conduction Disutrbances: none  Narrative Interpretation: unremarkable        Labs Reviewed  CBC - Abnormal; Notable for the following:    WBC 16.3 (*)     All other components within normal limits  PRO B NATRIURETIC PEPTIDE - Abnormal; Notable for the following:    Pro B Natriuretic peptide (BNP) 168.4 (*)     All other components within normal limits  BASIC METABOLIC PANEL - Abnormal; Notable for the following:    CO2 18 (*)     Glucose, Bld 245 (*)     GFR calc non Af Amer 87 (*)     All other components within normal limits  TSH - Abnormal; Notable for the following:    TSH 0.099 (*)     All other components within normal limits  HEMOGLOBIN A1C -  Abnormal; Notable for the following:    Hemoglobin A1C 8.5 (*)     Mean Plasma Glucose 197 (*)     All other components within normal limits  LIPID PANEL - Abnormal; Notable for the following:    LDL Cholesterol 126 (*)     All other components within normal limits  GLUCOSE, CAPILLARY - Abnormal; Notable for the following:    Glucose-Capillary 184 (*)     All other components within normal limits  LACTIC ACID, PLASMA - Abnormal; Notable for the following:    Lactic Acid, Venous 2.7 (*)     All other components within normal limits  HEPARIN LEVEL (UNFRACTIONATED) - Abnormal; Notable for the following:    Heparin  Unfractionated 0.16 (*)     All other components within normal limits  BASIC METABOLIC PANEL - Abnormal; Notable for the following:    Glucose, Bld 232 (*)     GFR calc non Af Amer 87 (*)     All other components within normal limits  CBC - Abnormal; Notable for the following:    WBC 11.4 (*)     All other components within normal limits  SALICYLATE LEVEL - Abnormal; Notable for the following:    Salicylate Lvl <2.0 (*)     All other components within normal limits  URINALYSIS, ROUTINE W REFLEX MICROSCOPIC - Abnormal; Notable for the following:    Glucose, UA >1000 (*)     All other components within normal limits  GLUCOSE, CAPILLARY - Abnormal; Notable for the following:    Glucose-Capillary 286 (*)     All other components within normal limits  BASIC METABOLIC PANEL - Abnormal; Notable for the following:    Glucose, Bld 343 (*)     GFR calc non Af Amer 75 (*)     GFR calc Af Amer 87 (*)     All other components within normal limits  APTT - Abnormal; Notable for the following:    aPTT 75 (*)     All other components within normal limits  SALICYLATE LEVEL - Abnormal; Notable for the following:    Salicylate Lvl <2.0 (*)     All other components within normal limits  GLUCOSE, CAPILLARY - Abnormal; Notable for the following:    Glucose-Capillary 245 (*)     All other components within normal limits  GLUCOSE, CAPILLARY - Abnormal; Notable for the following:    Glucose-Capillary 228 (*)     All other components within normal limits  GLUCOSE, CAPILLARY - Abnormal; Notable for the following:    Glucose-Capillary 289 (*)     All other components within normal limits  POCT I-STAT TROPONIN I  MAGNESIUM  TROPONIN I  TROPONIN I  URINE MICROSCOPIC-ADD ON  PROTIME-INR  TROPONIN I  HEPARIN LEVEL (UNFRACTIONATED)  URINALYSIS, ROUTINE W REFLEX MICROSCOPIC  T4, FREE   Dg Chest Port 1 View  06/25/2012  *RADIOLOGY REPORT*  Clinical Data: Chest pain and shortness of breath  PORTABLE  CHEST - 1 VIEW  Comparison: 06/24/2011  Findings: The cardiomediastinal silhouette is within normal limits. Lungs are clear.  No pneumothorax.  IMPRESSION: Negative.   Original Report Authenticated By: Donavan Burnet, M.D.      1. Chest pain   2. Accelerating angina   3. Diabetes mellitus   4. Hypertension   5. GERD (gastroesophageal reflux disease)   6. Metabolic acidosis, increased anion gap       MDM  Pt presenting with chest pain over the past  several weeks worsening.  Labs reassuring, story concerning for ACS.  D/w triad for admission.  They will see in ED for admission.          Ethelda Chick, MD 06/26/12 1300

## 2012-06-25 NOTE — Progress Notes (Signed)
ANTICOAGULATION CONSULT NOTE - Follow Up Consult  Pharmacy Consult for heparin Indication: chest pain/ACS  Labs:  Basename 06/25/12 1233 06/25/12 0725  HGB -- 14.8  HCT -- 42.0  PLT -- 234  APTT -- --  LABPROT -- --  INR -- --  HEPARINUNFRC -- --  CREATININE -- 0.90  CKTOTAL -- --  CKMB -- --  TROPONINI <0.30 --    Assessment: 66yo male subtherapeutic (level 0.16) on heparin with initial dosing for CP.  Goal of Therapy:  Heparin level 0.3-0.7 units/ml   Plan:  Will rebolus with heparin 3000 units x1 and increase gtt by 3 units/kg/hr to 1500 units/hr and check level in 6hr.  Colleen Can PharmD BCPS 06/25/2012,11:42 PM

## 2012-06-26 ENCOUNTER — Other Ambulatory Visit: Payer: Self-pay

## 2012-06-26 LAB — CBC
HCT: 41.5 % (ref 39.0–52.0)
Hemoglobin: 14.2 g/dL (ref 13.0–17.0)
MCV: 86.6 fL (ref 78.0–100.0)
RBC: 4.79 MIL/uL (ref 4.22–5.81)
RDW: 12.7 % (ref 11.5–15.5)
WBC: 11.4 10*3/uL — ABNORMAL HIGH (ref 4.0–10.5)

## 2012-06-26 LAB — BASIC METABOLIC PANEL
CO2: 23 mEq/L (ref 19–32)
Chloride: 104 mEq/L (ref 96–112)
GFR calc Af Amer: >90 mL/min (ref >90)
Potassium: 3.9 mEq/L (ref 3.5–5.1)
Sodium: 139 mEq/L (ref 135–145)

## 2012-06-26 LAB — GLUCOSE, CAPILLARY: Glucose-Capillary: 322 mg/dL — ABNORMAL HIGH (ref 70–99)

## 2012-06-26 LAB — TROPONIN I: Troponin I: <0.3 ng/mL (ref <0.30)

## 2012-06-26 LAB — T4, FREE: Free T4: 1.02 ng/dL (ref 0.80–1.80)

## 2012-06-26 LAB — HEPARIN LEVEL (UNFRACTIONATED): Heparin Unfractionated: 0.35 IU/mL (ref 0.30–0.70)

## 2012-06-26 MED ORDER — SODIUM CHLORIDE 0.9 % IV SOLN
1.0000 mL/kg/h | INTRAVENOUS | Status: DC
  Administered 2012-06-27: 1 mL/kg/h via INTRAVENOUS

## 2012-06-26 MED ORDER — SODIUM CHLORIDE 0.9 % IJ SOLN: 3.0000 mL | Freq: Two times a day (BID) | INTRAMUSCULAR | Status: DC

## 2012-06-26 MED ORDER — DIAZEPAM 5 MG PO TABS
5.0000 mg | ORAL_TABLET | ORAL | Status: AC
  Administered 2012-06-27: 5 mg via ORAL
  Filled 2012-06-26: qty 1

## 2012-06-26 MED ORDER — INSULIN ASPART 100 UNIT/ML ~~LOC~~ SOLN
5.0000 [IU] | Freq: Once | SUBCUTANEOUS | Status: AC
  Administered 2012-06-26: 5 [IU] via SUBCUTANEOUS

## 2012-06-26 MED ORDER — SODIUM CHLORIDE 0.9 % IJ SOLN: 3.0000 mL | INTRAMUSCULAR | Status: DC | PRN

## 2012-06-26 MED ORDER — ASPIRIN 81 MG PO CHEW
324.0000 mg | CHEWABLE_TABLET | ORAL | Status: AC
  Administered 2012-06-27: 324 mg via ORAL
  Filled 2012-06-26: qty 4

## 2012-06-26 MED ORDER — SODIUM CHLORIDE 0.9 % IV SOLN: 250.0000 mL | INTRAVENOUS | Status: DC | PRN

## 2012-06-26 NOTE — Progress Notes (Signed)
Medical Student Daily Progress Note   Subjective:    Interval Events:  Slept well last night. He denies any current chest pain, SOB, or palpitations. Was able to ambulate and walked around the ward without SOB or chest pain. Spent 30 minutes with patient reviewing labs and imaging studies and educating about CAD and CHF. Patient is in contemplate stage of change, although he tells me that he will try to work on diet and exercise. Wife is supportive by bedside. Two sons also in the room.    Objective:    Vital Signs:   Temp:  [98 F (36.7 C)-98.2 F (36.8 C)] 98.1 F (36.7 C) (10/13 0500) Pulse Rate:  [60-69] 61  (10/13 0500) Resp:  [14-21] 18  (10/13 0500) BP: (100-136)/(64-90) 100/64 mmHg (10/13 0500) SpO2:  [99 %-100 %] 99 % (10/13 0500)     Weights: 24-hour Weight change:   Filed Weights   06/25/12 0716  Weight: 91.627 kg (202 lb)     Intake/Output:   Intake/Output Summary (Last 24 hours) at 06/26/12 0904 Last data filed at 06/26/12 0500  Gross per 24 hour  Intake 947.17 ml  Output      0 ml  Net 947.17 ml       Physical Exam: General appearance: alert, cooperative and no distress Resp: clear to auscultation bilaterally and no diminished breath sounds in lung fields Cardio: regular rate and rhythm, S1, S2 normal, no murmur, click, rub or gallop GI: soft, non-tender; bowel sounds normal; no masses,  no organomegaly    Labs: Basic Metabolic Panel:  Lab 06/25/12 1610 06/25/12 1233 06/25/12 0725  NA 135 -- 137  K 4.0 -- 4.4  CL 101 -- 101  CO2 22 -- 18*  GLUCOSE 343* -- 245*  BUN 16 -- 15  CREATININE 1.02 -- 0.90  CALCIUM 9.8 -- 10.2  MG -- 2.0 --  PHOS -- -- --    Liver Function Tests: No results found for this basename: AST:5,ALT:5,ALKPHOS:5,BILITOT:5,PROT:5,ALBUMIN:5 in the last 168 hours No results found for this basename: LIPASE:5,AMYLASE:5 in the last 168 hours No results found for this basename: AMMONIA:3 in the last 168 hours  CBC:  Lab  06/26/12 0748 06/25/12 0725  WBC 11.4* 16.3*  NEUTROABS -- --  HGB 14.2 14.8  HCT 41.5 42.0  MCV 86.6 85.7  PLT 219 234    Cardiac Enzymes:  Lab 06/26/12 0118 06/25/12 1845 06/25/12 1233  CKTOTAL -- -- --  CKMB -- -- --  CKMBINDEX -- -- --  TROPONINI <0.30 <0.30 <0.30    BNP (last 3 results):  Basename 06/25/12 0726  PROBNP 168.4*    CBG:  Lab 06/26/12 0742 06/25/12 2102 06/25/12 1717 06/25/12 1339  GLUCAP 228* 245* 286* 184*    Coagulation Studies:  Basename 06/25/12 1845  LABPROT 14.2  INR 1.11    Microbiology: No results found for this or any previous visit.  Other results:   Imaging: Dg Chest Port 1 View  06/25/2012  *RADIOLOGY REPORT*  Clinical Data: Chest pain and shortness of breath  PORTABLE CHEST - 1 VIEW  Comparison: 06/24/2011  Findings: The cardiomediastinal silhouette is within normal limits. Lungs are clear.  No pneumothorax.  IMPRESSION: Negative.   Original Report Authenticated By: Donavan Burnet, M.D.       Medications:    Infusions:    . heparin 1,500 Units/hr (06/25/12 2344)     Scheduled Medications:    . aspirin  324 mg Oral Once  . aspirin EC  81 mg Oral Daily  . atenolol  100 mg Oral Daily  . atorvastatin  80 mg Oral q1800  . diltiazem  120 mg Oral Daily  . enalapril  20 mg Oral Daily  . heparin  3,000 Units Intravenous Once  . heparin  4,000 Units Intravenous Once  . influenza  inactive virus vaccine  0.5 mL Intramuscular Tomorrow-1000  . insulin aspart  0-9 Units Subcutaneous TID WC  . montelukast  10 mg Oral QHS  . nabumetone  750 mg Oral BID  . pantoprazole  40 mg Oral Daily  . sodium chloride  500 mL Intravenous Once  . sodium chloride  3 mL Intravenous Q12H  . sodium chloride  3 mL Intravenous Q12H  . DISCONTD: aspirin  81 mg Oral Daily  . DISCONTD: diltiazem  120 mg Oral QID  . DISCONTD: diltiazem  120 mg Oral Daily  . DISCONTD: diltiazem  120 mg Oral Daily  . DISCONTD: enoxaparin (LOVENOX) injection  40  mg Subcutaneous Q24H     PRN Medications: acetaminophen, acetaminophen, morphine injection, nitroGLYCERIN   Assessment/ Plan:    66 yo [place] American male w/ hx of HTN, diabetes, and GERD is admitted for chest pain.   1) Chest pain - Patient presents with history and symptoms consistent with unstable angina. Patient had negative EKG findings and troponin I x 2 were negative making other NSTEMI or STEMI unlikely. CXR negative for pneumothorax, mediastinal widening or pulmonary edema. Elevated pro-BNP at 168.4 is also suggestive of heart failure and the patient presents with 2-3 mo history of SOB w/ activity and fatigue, although does not appear volume overloaded. 2D echo shows grade I diastolic dysfunction and EF of 55-60%. Cardiology has been consulted and a cardiac cath has been scheduled for Monday 10/14. Appreciate their consult.  --Admit to telemetry. Cycle troponin. --Per cardiology: IV heparin, high dose atorvastatin, aspirin, nitroglycerin PRN. Continue home atenolol  --Risk stratification: LDL 126, f/u TSH , f/u HbA1c  2) Leukocytosis - Likely 2/2 to recent prednisone. Low clinical suspicion for infection.   3) Metabolic acidosis - Resolved. On presentation patient had AG of 18, delta gap of 1. Salicylate level was WNL. Urine negative for ketones. Denies alcohol use or recent drug ingestions. Lactic acid level mildly elevated to 2.7, suggestive of possible tissue hypoperfusion, but gap has resolved.  3) Hypertension: Continue enalapril, diltiazem at home doses   3) Diabetes. F/u HbA1c. Hold home medications.  --SSI   4) GERD: Continue home regimen, pantoprazole.   5) Chronic back pain: Continue home regimen of nabumetone.  6) PPX:  DVT: IV heparin  PUD: pantoprazole   LOS: 1d  Michael Guzman, MS4 June 26, 2012   This is a Psychologist, occupational Note.  The care of the patient was discussed with Dr. Loistine Chance and the assessment and plan formulated with their assistance.  Please  see their attached note or addendum for official documentation of the daily encounter.

## 2012-06-26 NOTE — Progress Notes (Signed)
Subjective:  Patient feels better. He had no further episode of chest pain or SOB but noted the has been to moving around.  Objective: Vital signs in last 24 hours: Filed Vitals:   06/25/12 1611 06/25/12 2100 06/26/12 0500 06/26/12 1006  BP: 121/74 115/78 100/64 113/71  Pulse: 69 62 61   Temp: 98.2 F (36.8 C) 98.1 F (36.7 C) 98.1 F (36.7 C)   TempSrc: Oral     Resp: 20 16 18    Height:      Weight:      SpO2: 99% 99% 99%    Weight change:   Intake/Output Summary (Last 24 hours) at 06/26/12 1140 Last data filed at 06/26/12 0900  Gross per 24 hour  Intake 1187.17 ml  Output      0 ml  Net 1187.17 ml   Physical Exam: Constitutional: Vital signs reviewed.  Patient is a well-developed and well-nourished  in no acute distress and cooperative with exam. Alert and oriented x3.  Neck: Supple,  Cardiovascular: RRR, S1 normal, S2 normal, no MRG, pulses symmetric and intact bilaterally Pulmonary/Chest: CTAB, no wheezes, rales, or rhonchi Abdominal: Soft. Non-tender, non-distended, bowel sounds are normal, Neurological: A&O x3,    Lab Results: Basic Metabolic Panel:  Lab 06/26/12 4098 06/25/12 1845 06/25/12 1233  NA 139 135 --  K 3.9 4.0 --  CL 104 101 --  CO2 23 22 --  GLUCOSE 232* 343* --  BUN 16 16 --  CREATININE 0.92 1.02 --  CALCIUM 9.5 9.8 --  MG -- -- 2.0  PHOS -- -- --    CBC:  Lab 06/26/12 0748 06/25/12 0725  WBC 11.4* 16.3*  NEUTROABS -- --  HGB 14.2 14.8  HCT 41.5 42.0  MCV 86.6 85.7  PLT 219 234   Cardiac Enzymes:  Lab 06/26/12 0118 06/25/12 1845 06/25/12 1233  CKTOTAL -- -- --  CKMB -- -- --  CKMBINDEX -- -- --  TROPONINI <0.30 <0.30 <0.30   BNP:  Lab 06/25/12 0726  PROBNP 168.4*    CBG:  Lab 06/26/12 0742 06/25/12 2102 06/25/12 1717 06/25/12 1339  GLUCAP 228* 245* 286* 184*   Hemoglobin A1C:  Lab 06/25/12 1233  HGBA1C 8.5*   Fasting Lipid Panel:  Lab 06/25/12 1233  CHOL 195  HDL 47  LDLCALC 126*  TRIG 108  CHOLHDL 4.1    LDLDIRECT --   Thyroid Function Tests:  Lab 06/25/12 1233  TSH 0.099*  T4TOTAL --  FREET4 --  T3FREE --  THYROIDAB --   Coagulation:  Lab 06/25/12 1845  LABPROT 14.2  INR 1.11    Lab 06/25/12 1517  COLORURINE YELLOW  LABSPEC 1.019  PHURINE 5.5  GLUCOSEU >1000*  HGBUR NEGATIVE  BILIRUBINUR NEGATIVE  KETONESUR NEGATIVE  PROTEINUR NEGATIVE  UROBILINOGEN 0.2  NITRITE NEGATIVE  LEUKOCYTESUR NEGATIVE    Studies/Results: Dg Chest Port 1 View  06/25/2012  *RADIOLOGY REPORT*  Clinical Data: Chest pain and shortness of breath  PORTABLE CHEST - 1 VIEW  Comparison: 06/24/2011  Findings: The cardiomediastinal silhouette is within normal limits. Lungs are clear.  No pneumothorax.  IMPRESSION: Negative.   Original Report Authenticated By: Donavan Burnet, M.D.    Medications: I have reviewed the patient's current medications. Scheduled Meds:   . aspirin EC  81 mg Oral Daily  . atenolol  100 mg Oral Daily  . atorvastatin  80 mg Oral q1800  . diltiazem  120 mg Oral Daily  . enalapril  20 mg Oral Daily  .  heparin  3,000 Units Intravenous Once  . heparin  4,000 Units Intravenous Once  . influenza  inactive virus vaccine  0.5 mL Intramuscular Tomorrow-1000  . insulin aspart  0-9 Units Subcutaneous TID WC  . montelukast  10 mg Oral QHS  . nabumetone  750 mg Oral BID  . pantoprazole  40 mg Oral Daily  . sodium chloride  3 mL Intravenous Q12H  . sodium chloride  3 mL Intravenous Q12H  . DISCONTD: aspirin  81 mg Oral Daily  . DISCONTD: diltiazem  120 mg Oral QID  . DISCONTD: diltiazem  120 mg Oral Daily  . DISCONTD: diltiazem  120 mg Oral Daily  . DISCONTD: enoxaparin (LOVENOX) injection  40 mg Subcutaneous Q24H   Continuous Infusions:   . heparin 1,500 Units/hr (06/25/12 2344)   PRN Meds:.acetaminophen, acetaminophen, morphine injection, nitroGLYCERIN Assessment/Plan:  66 yo [place] American male w/ hx of HTN, diabetes, and GERD is admitted for chest pain.   1) Chest  pain - Patient presents with history and symptoms consistent with unstable angina. Patient had negative EKG findings and troponin I x 3 were negative making other NSTEMI or STEMI unlikely. 2D echo shows grade I diastolic dysfunction and EF of 55-60%. Cardiology has been consulted and a cardiac cath has been scheduled for Monday 10/14. LDL 126. TSH 0.099. Hgb A1c 8.5 --Per cardiology: IV heparin, high dose atorvastatin, aspirin, nitroglycerin PRN. Continue home atenolol  - f/u free T4  2) Leukocytosis - Improving. Likely 2/2 to recent prednisone. Low clinical suspicion for infection.   3) Metabolic acidosis - Resolved. On presentation patient had AG of 18, delta gap of 1. Salicylate level was WNL. Urine negative for ketones. Denies alcohol use or recent drug ingestions. Lactic acid level mildly elevated to 2.7.  3) Hypertension:- stable Continue enalapril, diltiazem at home doses   3) Diabetes. F/u HbA1c. Hold home medications.  Hgb A1c 8.5.  --SSI   4) GERD: Continue home regimen, pantoprazole.     LOS: 1 day   Sathvika Ojo 06/26/2012, 11:40 AM

## 2012-06-26 NOTE — Progress Notes (Signed)
Subjective:  Feels OK. No CP, no SOB. D=  Objective:  Vital Signs in the last 24 hours: Temp:  [98.1 F (36.7 C)] 98.1 F (36.7 C) (10/13 0500) Pulse Rate:  [61-62] 61  (10/13 0500) Resp:  [16-18] 18  (10/13 0500) BP: (100-115)/(64-78) 113/71 mmHg (10/13 1006) SpO2:  [99 %] 99 % (10/13 0500)  Intake/Output from previous day: 10/12 0701 - 10/13 0700 In: 947.2 [P.O.:240; I.V.:707.2] Out: -    Physical Exam: General: Well developed, well nourished, in no acute distress. Head:  Normocephalic and atraumatic. Lungs: Clear to auscultation and percussion. Heart: Normal S1 and S2.  No murmur, rubs or gallops.  Abdomen: soft, non-tender, positive bowel sounds. Extremities: No clubbing or cyanosis. No edema. Neurologic: Alert and oriented x 3.    Lab Results:  Basename 06/26/12 0748 06/25/12 0725  WBC 11.4* 16.3*  HGB 14.2 14.8  PLT 219 234    Basename 06/26/12 0748 06/25/12 1845  NA 139 135  K 3.9 4.0  CL 104 101  CO2 23 22  GLUCOSE 232* 343*  BUN 16 16  CREATININE 0.92 1.02    Basename 06/26/12 0118 06/25/12 1845  TROPONINI <0.30 <0.30   Hepatic Function Panel No results found for this basename: PROT,ALBUMIN,AST,ALT,ALKPHOS,BILITOT,BILIDIR,IBILI in the last 72 hours  Basename 06/25/12 1233  CHOL 195   No results found for this basename: PROTIME in the last 72 hours  Imaging: Dg Chest Port 1 View  06/25/2012  *RADIOLOGY REPORT*  Clinical Data: Chest pain and shortness of breath  PORTABLE CHEST - 1 VIEW  Comparison: 06/24/2011  Findings: The cardiomediastinal silhouette is within normal limits. Lungs are clear.  No pneumothorax.  IMPRESSION: Negative.   Original Report Authenticated By: Donavan Burnet, M.D.    Personally viewed.   Telemetry: No VT Personally viewed.   EKG:  NSR, no ST changes  Cardiac Studies:  Normal EF  Assessment/Plan:  Principal Problem:  *Accelerating angina Active Problems:  Chest pain  Hypertension  Diabetes mellitus  GERD  (gastroesophageal reflux disease)  Metabolic acidosis, increased anion gap  -Discussed cardiac cath for tomorrow. Dr. Mayford Knife likely to perform. Discussed risks and benefits including CVA, MI, Death, bleeding.  Willing to proceed.  -With risk factors and exertional angina..cath.   -Heparin IV, ASA.  -Trop neg.   Will see in am.    SKAINS, MARK 06/26/2012, 8:45 PM

## 2012-06-26 NOTE — Progress Notes (Signed)
ANTICOAGULATION CONSULT NOTE - Follow Up Consult  Pharmacy Consult for heparin Indication: chest pain/ACS  Labs:  Basename 06/26/12 0748 06/26/12 0118 06/25/12 2219 06/25/12 1845 06/25/12 1233 06/25/12 0725  HGB 14.2 -- -- -- -- 14.8  HCT 41.5 -- -- -- -- 42.0  PLT 219 -- -- -- -- 234  APTT -- -- -- 75* -- --  LABPROT -- -- -- 14.2 -- --  INR -- -- -- 1.11 -- --  HEPARINUNFRC 0.35 -- 0.16* -- -- --  CREATININE 0.92 -- -- 1.02 -- 0.90  CKTOTAL -- -- -- -- -- --  CKMB -- -- -- -- -- --  TROPONINI -- <0.30 -- <0.30 <0.30 --    Assessment: 66yo male therapeutic (level 0.35) on heparin with initial dosing for CP.  Goal of Therapy:  Heparin level 0.3-0.7 units/ml   Plan:  Continue heparin at 1500 units/hr. Daily heparin level/CBC.   Marylouise Stacks PharmD BCPS 06/26/2012,9:22 AM

## 2012-06-26 NOTE — H&P (Addendum)
Attending addendum: I have seen and examined Michael Guzman and discussed his management with the medical team. Michael Guzman has been having exertional shortness of breath and chest pain over the last 2-3 months that has been getting progressively worse. Given his multiple risk factors for coronary artery disease I strongly suspect that he has unstable angina and agree with cardiac catheterization tomorrow.  Cliffton Asters, MD [facility] [phone] pager   [phone] cell 06/26/2012, 10:24 AM

## 2012-06-26 NOTE — Progress Notes (Signed)
Called about RN about increased fsbs 200-300s with only SSI coverage. Rx Novolog 5 extra units  Shirlee Latch MD

## 2012-06-27 ENCOUNTER — Encounter (HOSPITAL_COMMUNITY)
Admission: EM | Disposition: A | Discharge status: Home / Self Care | Payer: Self-pay | Source: Home / Self Care | Attending: Emergency Medicine

## 2012-06-27 DIAGNOSIS — I2510 Atherosclerotic heart disease of native coronary artery without angina pectoris: Secondary | ICD-10-CM

## 2012-06-27 HISTORY — PX: LEFT HEART CATHETERIZATION WITH CORONARY ANGIOGRAM: SHX5451

## 2012-06-27 LAB — GLUCOSE, CAPILLARY
Glucose-Capillary: 217 mg/dL — ABNORMAL HIGH (ref 70–99)
Glucose-Capillary: 207 mg/dL — ABNORMAL HIGH (ref 70–99)
Glucose-Capillary: 179 mg/dL — ABNORMAL HIGH (ref 70–99)

## 2012-06-27 SURGERY — LEFT HEART CATHETERIZATION WITH CORONARY ANGIOGRAM
Anesthesia: LOCAL

## 2012-06-27 MED ORDER — ACETAMINOPHEN 325 MG PO TABS: 650.0000 mg | ORAL_TABLET | ORAL | Status: DC | PRN

## 2012-06-27 MED ORDER — FENTANYL CITRATE 0.05 MG/ML IJ SOLN
INTRAMUSCULAR | Status: AC
  Filled 2012-06-27: qty 2

## 2012-06-27 MED ORDER — POLYETHYLENE GLYCOL 3350 17 G PO PACK
17.0000 g | PACK | Freq: Every day | ORAL | Status: DC
  Filled 2012-06-27 (×2): qty 1

## 2012-06-27 MED ORDER — ONDANSETRON HCL 4 MG/2ML IJ SOLN: 4.0000 mg | Freq: Four times a day (QID) | INTRAMUSCULAR | Status: DC | PRN

## 2012-06-27 MED ORDER — CLOPIDOGREL BISULFATE 75 MG PO TABS: 75.0000 mg | ORAL_TABLET | Freq: Every day | ORAL | Status: DC

## 2012-06-27 MED ORDER — SODIUM CHLORIDE 0.9 % IV SOLN
0.2500 mg/kg/h | INTRAVENOUS | Status: DC
  Filled 2012-06-27: qty 250

## 2012-06-27 MED ORDER — LIDOCAINE HCL (PF) 1 % IJ SOLN
INTRAMUSCULAR | Status: AC
  Filled 2012-06-27: qty 30

## 2012-06-27 MED ORDER — HEPARIN (PORCINE) IN NACL 2-0.9 UNIT/ML-% IJ SOLN
INTRAMUSCULAR | Status: AC
  Filled 2012-06-27: qty 1000

## 2012-06-27 MED ORDER — MIDAZOLAM HCL 2 MG/2ML IJ SOLN
INTRAMUSCULAR | Status: AC
  Filled 2012-06-27: qty 2

## 2012-06-27 MED ORDER — SODIUM CHLORIDE 0.9 % IV SOLN: INTRAVENOUS | Status: AC

## 2012-06-27 MED ORDER — BIVALIRUDIN 250 MG IV SOLR
INTRAVENOUS | Status: AC
  Filled 2012-06-27: qty 250

## 2012-06-27 MED ORDER — ASPIRIN EC 325 MG PO TBEC
325.0000 mg | DELAYED_RELEASE_TABLET | Freq: Every day | ORAL | Status: DC
  Administered 2012-06-28: 10:00:00 325 mg via ORAL
  Filled 2012-06-27: qty 1

## 2012-06-27 MED ORDER — CEFAZOLIN SODIUM 1-5 GM-% IV SOLN
INTRAVENOUS | Status: AC
  Filled 2012-06-27: qty 50

## 2012-06-27 MED ORDER — NITROGLYCERIN 0.2 MG/ML ON CALL CATH LAB
INTRAVENOUS | Status: AC
  Filled 2012-06-27: qty 1

## 2012-06-27 MED ORDER — HEPARIN SODIUM (PORCINE) 1000 UNIT/ML IJ SOLN
INTRAMUSCULAR | Status: AC
  Filled 2012-06-27: qty 1

## 2012-06-27 MED ORDER — OXYCODONE-ACETAMINOPHEN 5-325 MG PO TABS: 1.0000 | ORAL_TABLET | ORAL | Status: DC | PRN

## 2012-06-27 MED ORDER — LIVING WELL WITH DIABETES BOOK
Freq: Once | Status: AC
  Administered 2012-06-27: 16:00:00
  Filled 2012-06-27 (×2): qty 1

## 2012-06-27 NOTE — Progress Notes (Signed)
Internal Medicine Teaching Service Attending Dr.Zacharie Portner I have examined the patient at bedside today and discussed the management of the Patient with the resident team. Currently undergoing PCI today.

## 2012-06-27 NOTE — Progress Notes (Signed)
I have reviewed and agree with Medical student Wynelle Link. Please see my separate note .  Almyra Deforest, MD

## 2012-06-27 NOTE — Progress Notes (Signed)
Inpatient Diabetes Program Recommendations  AACE/ADA: New Consensus Statement on Inpatient Glycemic Control (2013)  Target Ranges:  Prepandial:   less than 140 mg/dL      Peak postprandial:   less than 180 mg/dL (1-2 hours)      Critically ill patients:  140 - 180 mg/dL   Reason for Visit: referral for diabetes education  Inpatient Diabetes Program Recommendations Insulin - Basal: Noted pt on Janumet at home. Needs some basal insulin while here.  would start with 10-15 units daily or HS Outpatient Referral: Will order in-pt education per bedside RN using system network videos on nutrtion, exercise Diet: Will order dietician consult for elevated A1C.  Note: Will talk with patient when available regarding potential need for OP education.

## 2012-06-27 NOTE — CV Procedure (Signed)
   PERCUTANEOUS CORONARY INTERVENTION   Michael Guzman is a 66 y.o. male  INDICATION: 66 year old gentleman with exertional chest discomfort admitted with unstable angina and demonstrated to have high-grade mid LAD and obtuse marginal #2 disease by catheterization this morning.   PROCEDURE: 1. Drug-eluting stent obtuse marginal #2.  2. Drug-eluting stent mid LAD   CONSENT: The risks, benefits, and details of the procedure were explained to the patient. Risks including death, MI, stroke, bleeding, limb ischemia, renal failure and allergy were described and accepted by the patient.  Informed written consent was obtained prior to proceeding.  PROCEDURE TECHNIQUE:  After Xylocaine anesthesia a 6 French sheath was placed in the right femoral artery in exchange for the 5 French sheath placed by Dr. Mayford Knife during the diagnostic procedure earlier this morning (approximately 90 minutes ago).   Coronary guiding shots were made using a 6 French 3.5 cm CLS guide catheter. Antithrombotic therapy, Angiomax bolus and infusion, was begun and determined to be therapeutic by ACT. Antiplatelet therapy, study drug (Bralinta or Plavix), was loaded.  A Prowater wire was used to obtain access across the second obtuse marginal. Predilatation with a 2.0 balloon followed by a 2.5 x 16 mm Promus element stent. The stent was deployed and we noted a distal edge dissection. A second overlapping 2.25 x 8 mm stent was deployed. Post dilatation with a 2.5 x 12 mm balloon to 14 atmospheres was performed. A good angiographic result was obtained.  We then turned attention to the LAD lesion. The BMW wire was directed down the LAD and across the LAD stenosis. A 6 mm long by 2.5 mm diameter cutting balloon was used for predilatation. We then positioned and deployed a 16 mm long by 3.0 mm diameter Promus elements stent. Post dilatation was performed with a 12 mm long by 3.0 mm Hominy balloon to 14 atmospheres. No complications occurred  .  Mynx closure device was used with good hemostasis.  CONTRAST:  Total of 105 cc.  COMPLICATIONS:  None.    ANGIOGRAPHIC RESULTS:   1. The obtuse marginal stenosis was reduced from 99% to 0% with TIMI grade 3 flow  2. The LAD stenosis was reduced from 80% to 0% with TIMI grade 3 flow   IMPRESSIONS:  1. Successful double vessel drug-eluting stent implantation in this 66 year old diabetic with reduction in the LAD stenosis from 80-0%, and obtuse marginal #2 stenosis from 99% to 0%.   RECOMMENDATION:  1. Discharge in a.m. per Dr. Anne Fu. The patient is enrolled in a research trial with Brilinta.    [name], MD 06/27/2012 2:07 PM

## 2012-06-27 NOTE — Interval H&P Note (Signed)
History and Physical Interval Note:  06/27/2012 10:50 AM  Michael Guzman  has presented today for surgery, with the diagnosis of cp  The various methods of treatment have been discussed with the patient and family. After consideration of risks, benefits and other options for treatment, the patient has consented to  Procedure(s) (LRB) with comments: LEFT HEART CATHETERIZATION WITH CORONARY ANGIOGRAM (N/A) as a surgical intervention .  The patient's history has been reviewed, patient examined, no change in status, stable for surgery.  I have reviewed the patient's chart and labs.  Questions were answered to the patient's satisfaction.     TURNER,TRACI R

## 2012-06-27 NOTE — Progress Notes (Signed)
Medical Student Daily Progress Note   Subjective:    Interval Events:  No acute overnight events. Denies chest pain, SOB, or palpitations. Was able to ambulate yesterday and denies any symptoms during his walk. He denies symptoms of hyperthyroidism. Cardiac cath today.    Objective:    Vital Signs:   Temp:  [97.7 F (36.5 C)-97.9 F (36.6 C)] 97.7 F (36.5 C) (10/14 0600) Pulse Rate:  [54] 54  (10/14 0600) Resp:  [16-18] 18  (10/14 0600) BP: (107-121)/(66-81) 121/81 mmHg (10/14 0600) SpO2:  [97 %-99 %] 97 % (10/14 0600)     Weights: 24-hour Weight change:   Filed Weights   06/25/12 0716  Weight: 91.627 kg (202 lb)     Intake/Output:   Intake/Output Summary (Last 24 hours) at 06/27/12 0727 Last data filed at 06/27/12 0600  Gross per 24 hour  Intake 1254.63 ml  Output      0 ml  Net 1254.63 ml       Physical Exam: General appearance: alert, cooperative and no distress Neck: no adenopathy, no JVD and thyroid not enlarged, symmetric, no tenderness/mass/nodules Resp: clear to auscultation bilaterally Cardio: regular rate and rhythm, S1, S2 normal, no murmur, click, rub or gallop GI: soft, non-tender; bowel sounds normal; no masses,  no organomegaly    Labs: Basic Metabolic Panel:  Lab 06/26/12 1610 06/25/12 1845 06/25/12 1233 06/25/12 0725  NA 139 135 -- 137  K 3.9 4.0 -- 4.4  CL 104 101 -- 101  CO2 23 22 -- 18*  GLUCOSE 232* 343* -- 245*  BUN 16 16 -- 15  CREATININE 0.92 1.02 -- 0.90  CALCIUM 9.5 9.8 -- 10.2  MG -- -- 2.0 --  PHOS -- -- -- --    Liver Function Tests: No results found for this basename: AST:5,ALT:5,ALKPHOS:5,BILITOT:5,PROT:5,ALBUMIN:5 in the last 168 hours No results found for this basename: LIPASE:5,AMYLASE:5 in the last 168 hours No results found for this basename: AMMONIA:3 in the last 168 hours  CBC:  Lab 06/26/12 0748 06/25/12 0725  WBC 11.4* 16.3*  NEUTROABS -- --  HGB 14.2 14.8  HCT 41.5 42.0  MCV 86.6 85.7  PLT 219 234     Cardiac Enzymes:  Lab 06/26/12 0118 06/25/12 1845 06/25/12 1233  CKTOTAL -- -- --  CKMB -- -- --  CKMBINDEX -- -- --  TROPONINI <0.30 <0.30 <0.30    BNP (last 3 results):  Basename 06/25/12 0726  PROBNP 168.4*    CBG:  Lab 06/27/12 0723 06/26/12 2104 06/26/12 1708 06/26/12 1155 06/26/12 0742  GLUCAP 250* 275* 322* 289* 228*    Coagulation Studies:  Basename 06/25/12 1845  LABPROT 14.2  INR 1.11    Microbiology: No results found for this or any previous visit.  Other results: EKG: sinus bradycardia, no ST changes from previous EKG.  Imaging: Dg Chest Port 1 View  06/25/2012  *RADIOLOGY REPORT*  Clinical Data: Chest pain and shortness of breath  PORTABLE CHEST - 1 VIEW  Comparison: 06/24/2011  Findings: The cardiomediastinal silhouette is within normal limits. Lungs are clear.  No pneumothorax.  IMPRESSION: Negative.   Original Report Authenticated By: Donavan Burnet, M.D.       Medications:    Infusions:    . sodium chloride 1 mL/kg/hr (06/27/12 0325)  . heparin 1,500 Units/hr (06/27/12 0325)     Scheduled Medications:    . aspirin  324 mg Oral Pre-Cath  . aspirin EC  81 mg Oral Daily  . atenolol  100  mg Oral Daily  . atorvastatin  80 mg Oral q1800  . diazepam  5 mg Oral On Call  . diltiazem  120 mg Oral Daily  . enalapril  20 mg Oral Daily  . influenza  inactive virus vaccine  0.5 mL Intramuscular Tomorrow-1000  . insulin aspart  0-9 Units Subcutaneous TID WC  . insulin aspart  5 Units Subcutaneous Once  . montelukast  10 mg Oral QHS  . nabumetone  750 mg Oral BID  . pantoprazole  40 mg Oral Daily  . sodium chloride  3 mL Intravenous Q12H  . sodium chloride  3 mL Intravenous Q12H  . sodium chloride  3 mL Intravenous Q12H     PRN Medications: sodium chloride, acetaminophen, acetaminophen, morphine injection, nitroGLYCERIN, sodium chloride   Assessment/ Plan:    66 yo [place] American male w/ hx of HTN, diabetes, and GERD is admitted  for chest pain.   1) Chest pain - Patient presents with history and symptoms consistent with unstable angina. Patient had negative EKG findings and troponin I x 3 were negative making other NSTEMI or STEMI unlikely. CXR negative for pneumothorax, mediastinal widening or pulmonary edema. Elevated pro-BNP at 168.4 is also suggestive of heart failure and the patient presents with 2-3 mo history of SOB w/ activity and fatigue, although does not appear volume overloaded. 2D echo shows grade I diastolic dysfunction and EF of 55-60%. Cardiology has been consulted and a cardiac cath has been scheduled for Monday 10/14 (today). Appreciate their consult.  --Per cardiology: IV heparin, high dose atorvastatin, aspirin, nitroglycerin PRN. Continue home atenolol  --Risk stratification: LDL 126, TSH 0.099 (L), T4 1.02, HbA1c 8.5  2) Leukocytosis - Likely 2/2 to recent prednisone. Low clinical suspicion for infection.   3) Metabolic acidosis - Resolved. On presentation patient had AG of 18, delta gap of 1. Salicylate level was WNL. Urine negative for ketones. Denies alcohol use or recent drug ingestions. Lactic acid level mildly elevated to 2.7, suggestive of possible tissue hypoperfusion, but gap has resolved.   3) Hypertension: Continue enalapril, diltiazem at home doses   3) Diabetes. HbA1c 8.5. He requires better diabetic control and close follow up as outpatient. Hold home medications.  --SSI  --Diabetic educator to see him  4) Subclinical hyperthyroidism: Patient has TSH of 0.099 and normal free T4. He does not endorse any symptoms of hyperthyroidism. PE does not show any enlarged thyroid. Patient is already on atenolol 100mg , but will require close outpatient follow-up. Patient says Dr. Oneta Rack is already aware of this issue.  4) GERD: Continue home regimen, pantoprazole.   5) Chronic back pain: Continue home regimen of nabumetone.   6) PPX:  DVT: IV heparin  PUD: pantoprazole   LOS: 2d   Wynelle Link, MS4  June 27, 2012    This is a Psychologist, occupational Note.  The care of the patient was discussed with Dr. Loistine Chance and the assessment and plan formulated with their assistance.  Please see their attached note or addendum for official documentation of the daily encounter.  I have reviewed and agree with Medical student Wynelle Link. Please see my separate note .  Almyra Deforest, MD

## 2012-06-27 NOTE — Progress Notes (Signed)
Subjective:  Patient denies any chest pain or SOB. He was walking yesterday in the hall way and did not had any symptoms.  Objective: Vital signs in last 24 hours: Filed Vitals:   06/26/12 0500 06/26/12 1006 06/26/12 2130 06/27/12 0600  BP: 100/64 113/71 107/66 121/81  Pulse: 61  54 54  Temp: 98.1 F (36.7 C)  97.9 F (36.6 C) 97.7 F (36.5 C)  TempSrc:      Resp: 18  16 18   Height:      Weight:      SpO2: 99%  99% 97%   Weight change:   Intake/Output Summary (Last 24 hours) at 06/27/12 0723 Last data filed at 06/27/12 0600  Gross per 24 hour  Intake 1254.63 ml  Output      0 ml  Net 1254.63 ml   Physical Exam: Constitutional: Vital signs reviewed.  Patient is a well-developed and well-nourished  in no acute distress and cooperative with exam. Alert and oriented x3.  Neck: Supple,  Cardiovascular: RRR, S1 normal, S2 normal, no MRG, pulses symmetric and intact bilaterally Pulmonary/Chest: CTAB, no wheezes, rales, or rhonchi Abdominal: Soft. Non-tender, mildly distended distended, bowel sounds are normal, no masses, organomegaly, or guarding present.   Neurological: A&O x3,   Lab Results: Basic Metabolic Panel:  Lab 06/26/12 1610 06/25/12 1845 06/25/12 1233  NA 139 135 --  K 3.9 4.0 --  CL 104 101 --  CO2 23 22 --  GLUCOSE 232* 343* --  BUN 16 16 --  CREATININE 0.92 1.02 --  CALCIUM 9.5 9.8 --  MG -- -- 2.0  PHOS -- -- --    CBC:  Lab 06/26/12 0748 06/25/12 0725  WBC 11.4* 16.3*  NEUTROABS -- --  HGB 14.2 14.8  HCT 41.5 42.0  MCV 86.6 85.7  PLT 219 234   Cardiac Enzymes:  Lab 06/26/12 0118 06/25/12 1845 06/25/12 1233  CKTOTAL -- -- --  CKMB -- -- --  CKMBINDEX -- -- --  TROPONINI <0.30 <0.30 <0.30   BNP:  Lab 06/25/12 0726  PROBNP 168.4*    CBG:  Lab 06/26/12 2104 06/26/12 1708 06/26/12 1155 06/26/12 0742 06/25/12 2102 06/25/12 1717  GLUCAP 275* 322* 289* 228* 245* 286*   Hemoglobin A1C:  Lab 06/25/12 1233  HGBA1C 8.5*   Fasting  Lipid Panel:  Lab 06/25/12 1233  CHOL 195  HDL 47  LDLCALC 126*  TRIG 108  CHOLHDL 4.1  LDLDIRECT --   Thyroid Function Tests:  Lab 06/26/12 1204 06/25/12 1233  TSH -- 0.099*  T4TOTAL -- --  FREET4 1.02 --  T3FREE -- --  THYROIDAB -- --   Coagulation:  Lab 06/25/12 1845  LABPROT 14.2  INR 1.11    Urinalysis:  Lab 06/25/12 1517  COLORURINE YELLOW  LABSPEC 1.019  PHURINE 5.5  GLUCOSEU >1000*  HGBUR NEGATIVE  BILIRUBINUR NEGATIVE  KETONESUR NEGATIVE  PROTEINUR NEGATIVE  UROBILINOGEN 0.2  NITRITE NEGATIVE  LEUKOCYTESUR NEGATIVE   Micro Results: No results found for this or any previous visit (from the past 240 hour(s)). Studies/Results: Dg Chest Port 1 View  06/25/2012  *RADIOLOGY REPORT*  Clinical Data: Chest pain and shortness of breath  PORTABLE CHEST - 1 VIEW  Comparison: 06/24/2011  Findings: The cardiomediastinal silhouette is within normal limits. Lungs are clear.  No pneumothorax.  IMPRESSION: Negative.   Original Report Authenticated By: Donavan Burnet, M.D.    Medications: I have reviewed the patient's current medications. Scheduled Meds:   . aspirin  324  mg Oral Pre-Cath  . aspirin EC  81 mg Oral Daily  . atenolol  100 mg Oral Daily  . atorvastatin  80 mg Oral q1800  . diazepam  5 mg Oral On Call  . diltiazem  120 mg Oral Daily  . enalapril  20 mg Oral Daily  . influenza  inactive virus vaccine  0.5 mL Intramuscular Tomorrow-1000  . insulin aspart  0-9 Units Subcutaneous TID WC  . insulin aspart  5 Units Subcutaneous Once  . montelukast  10 mg Oral QHS  . nabumetone  750 mg Oral BID  . pantoprazole  40 mg Oral Daily  . sodium chloride  3 mL Intravenous Q12H  . sodium chloride  3 mL Intravenous Q12H  . sodium chloride  3 mL Intravenous Q12H   Continuous Infusions:   . sodium chloride 1 mL/kg/hr (06/27/12 0325)  . heparin 1,500 Units/hr (06/27/12 0325)   PRN Meds:.sodium chloride, acetaminophen, acetaminophen, morphine injection,  nitroGLYCERIN, sodium chloride Assessment/Plan:  66 yo [place] American male w/ hx of HTN, diabetes, and GERD is admitted for chest pain.   1) Chest pain - Patient presents with history and symptoms consistent with unstable angina. Patient had negative EKG findings and troponin I x 3 were negative making other NSTEMI or STEMI unlikely. 2D echo shows grade I diastolic dysfunction and EF of 55-60%.LDL 126. TSH 0.099. Free T4  1.02Hgb A1c 8.5  --Per cardiology: IV heparin, high dose atorvastatin, aspirin, nitroglycerin PRN. Continue home atenolol  -- Cath today  2) Leukocytosis - Improving. Likely 2/2 to recent prednisone. Low clinical suspicion for infection.   3) Metabolic acidosis - Resolved. On presentation patient had AG of 18, delta gap of 1. Salicylate level was WNL. Urine negative for ketones. Denies alcohol use or recent drug ingestions. Lactic acid level mildly elevated to 2.7.   3) Hypertension:- stable Continue enalapril, diltiazem at home doses   3) Diabetes.  Hold home medications. Hgb A1c 8.5.  --SSI  - diabetic Educator   4) GERD: Continue home regimen, pantoprazole.    LOS: 2 days   Ewin Rehberg 06/27/2012, 7:23 AM

## 2012-06-27 NOTE — CV Procedure (Signed)
PROCEDURE:  Left heart catheterization with selective coronary angiography, left ventriculogram.  INDICATIONS:  Unstable angina  The risks, benefits, and details of the procedure were explained to the patient.  The patient verbalized understanding and wanted to proceed.  Informed written consent was obtained.  PROCEDURE TECHNIQUE:  After Xylocaine anesthesia a 24F sheath was placed in the right femoral artery with a single anterior needle wall stick.   Left coronary angiography was done using a Judkins L4 guide catheter.  Right coronary angiography was done using a Judkins R4 guide catheter.  Left ventriculography was done using a pigtail catheter.    CONTRAST:  Total of 75 cc.  COMPLICATIONS:  None.    HEMODYNAMICS:  Aortic pressure was 126/16mmHg; LV pressure was 127/75mmHg; LVEDP .  There was no gradient between the left ventricle and aorta.    ANGIOGRAPHIC DATA:   The left main coronary artery is widely patent.  It bifurcates into an LAD and left circumflex arteries.  The left anterior descending artery is patent in its proximal portion.  It gives rise to a large diagonal which is patent and bifurcates into 2 daughter vessels.  The inferior branch is large and widely patent.  The superior branch is small and bifurcates into 2 smaller vessels which are patent.  Just distal to the takeoff of the diagonal, the LAD has an 80-90% stenosis.  The ongoing LAD is widely patent.    The left circumflex artery is widely patent throughout its course.  It gives rise to a moderate sized first OM which is patent.  It then gives rise to a second OM which is large and has a 90% stenosis in the proximal portion.    The right coronary artery is widely patent in its proximal portion.  It gives rise to a small acute RV marginal branch and then has a 50-60% stenosis in the mid vessel.  The ongoing RCA is patent and bifurcates distally into a PDA and PL branches.  The PDA has a 70-80% stenosis  proximally.  LEFT VENTRICULOGRAM:  Left ventricular angiogram was done in the 30 RAO projection and revealed normal left ventricular wall motion and systolic function with an estimated ejection fraction of 55%.  LVEDP was 3 mmHg.  IMPRESSIONS:  1. Normal left main coronary artery. 2. 80-90% stenosis of the mid LAD 3. Normal left circumflex artery with 90% stenosis of the second OM 4. 50-60% mid RCA stenosis and 70-80% proximal PDA stenosis 5. Normal left ventricular systolic function.  LVEDP 3 mmHg.  Ejection fraction 55%.  RECOMMENDATION:   1.  Films reviewed with Dr. Anne Fu and Dr. Katrinka Blazing.  Will plan PCI of the LAD and OM2 today.  Will give 2000 units IV Heparin.  Patient has been entered in the Brilinta study.

## 2012-06-27 NOTE — Progress Notes (Signed)
Subjective:  Mildly anxious, no CP, no SOB.   Objective:  Vital Signs in the last 24 hours: Temp:  [97.7 F (36.5 C)-97.9 F (36.6 C)] 97.7 F (36.5 C) (10/14 0600) Pulse Rate:  [54] 54  (10/14 0600) Resp:  [16-18] 18  (10/14 0600) BP: (107-121)/(66-81) 121/81 mmHg (10/14 0600) SpO2:  [97 %-99 %] 97 % (10/14 0600)  Intake/Output from previous day: 10/13 0701 - 10/14 0700 In: 1254.6 [P.O.:702; I.V.:552.6] Out: -    Physical Exam: General: Well developed, well nourished, in no acute distress. Head:  Normocephalic and atraumatic. Lungs: Clear to auscultation and percussion. Heart: Normal S1 and S2.  No murmur, rubs or gallops.  Abdomen: soft, non-tender, positive bowel sounds. Extremities: No clubbing or cyanosis. No edema. Neurologic: Alert and oriented x 3.    Lab Results:  Basename 06/26/12 0748 06/25/12 0725  WBC 11.4* 16.3*  HGB 14.2 14.8  PLT 219 234    Basename 06/26/12 0748 06/25/12 1845  NA 139 135  K 3.9 4.0  CL 104 101  CO2 23 22  GLUCOSE 232* 343*  BUN 16 16  CREATININE 0.92 1.02    Basename 06/26/12 0118 06/25/12 1845  TROPONINI <0.30 <0.30   Hepatic Function Panel No results found for this basename: PROT,ALBUMIN,AST,ALT,ALKPHOS,BILITOT,BILIDIR,IBILI in the last 72 hours  Basename 06/25/12 1233  CHOL 195   No results found for this basename: PROTIME in the last 72 hours  Imaging: Dg Chest Port 1 View  06/25/2012  *RADIOLOGY REPORT*  Clinical Data: Chest pain and shortness of breath  PORTABLE CHEST - 1 VIEW  Comparison: 06/24/2011  Findings: The cardiomediastinal silhouette is within normal limits. Lungs are clear.  No pneumothorax.  IMPRESSION: Negative.   Original Report Authenticated By: Donavan Burnet, M.D.    Personally viewed.   Telemetry: No events Personally viewed.   Assessment/Plan:  Principal Problem:  *Accelerating angina Active Problems:  Chest pain  Hypertension  Diabetes mellitus  GERD (gastroesophageal reflux  disease)  Metabolic acidosis, increased anion gap   -Cath today - Exertional angina. Trop normal. CCS 3. On Bb, has NTG -NPO -Dr. Mayford Knife likely to perform -Continue with prevention efforts (DM. HTN, Weight)  SKAINS, MARK 06/27/2012, 7:29 AM

## 2012-06-27 NOTE — H&P (Signed)
The patient has exertional angina for several months and severe obtuse marginal and mid LAD disease. I reviewed images with Dr. Mayford Knife and we feel is appropriate to proceed with PCI. Of note is a PDA ostial stenosis of greater than 90%. I discussed the procedure and risks including stroke, death, myocardial infarction, emergency surgery, bleeding, limb ischemia, infection, among others with the patient. His questions were answered. He consents to proceeding with the procedure.

## 2012-06-27 NOTE — Research (Signed)
Brilinta Ad Hoc PCI  Informed Consent   Subject Name: Michael Guzman  Subject met inclusion and exclusion criteria.  The informed consent form, study requirements and expectations were reviewed with the subject and questions and concerns were addressed prior to the signing of the consent form.  The subject verbalized understanding of the trial requirements.  The subject agreed to participate in the Brilinta Ad Hoc PCI  trial and signed the informed consent at 1000 on 06/27/12.  The informed consent was obtained prior to performance of any protocol-specific procedures for the subject.  A copy of the signed informed consent was given to the subject and a copy was placed in the subject's medical record.  Claire Shown 06/27/2012, 4:04 PM

## 2012-06-28 DIAGNOSIS — Z9861 Coronary angioplasty status: Secondary | ICD-10-CM

## 2012-06-28 DIAGNOSIS — E1169 Type 2 diabetes mellitus with other specified complication: Secondary | ICD-10-CM | POA: Diagnosis present

## 2012-06-28 DIAGNOSIS — E785 Hyperlipidemia, unspecified: Secondary | ICD-10-CM | POA: Diagnosis present

## 2012-06-28 DIAGNOSIS — I2510 Atherosclerotic heart disease of native coronary artery without angina pectoris: Secondary | ICD-10-CM

## 2012-06-28 LAB — CBC
HCT: 40.1 % (ref 39.0–52.0)
Hemoglobin: 13.5 g/dL (ref 13.0–17.0)
MCH: 29.2 pg (ref 26.0–34.0)
MCV: 86.6 fL (ref 78.0–100.0)
RBC: 4.63 MIL/uL (ref 4.22–5.81)
WBC: 7.4 10*3/uL (ref 4.0–10.5)

## 2012-06-28 LAB — BASIC METABOLIC PANEL
BUN: 14 mg/dL (ref 6–23)
CO2: 25 mEq/L (ref 19–32)
Calcium: 9.1 mg/dL (ref 8.4–10.5)
Chloride: 103 mEq/L (ref 96–112)
Creatinine, Ser: 0.93 mg/dL (ref 0.50–1.35)
Glucose, Bld: 195 mg/dL — ABNORMAL HIGH (ref 70–99)

## 2012-06-28 LAB — GLUCOSE, CAPILLARY: Glucose-Capillary: 186 mg/dL — ABNORMAL HIGH (ref 70–99)

## 2012-06-28 MED ORDER — ATORVASTATIN CALCIUM 40 MG PO TABS: 40.0000 mg | ORAL_TABLET | Freq: Every day | ORAL | Status: DC

## 2012-06-28 MED ORDER — TICAGRELOR 90 MG PO TABS: 90.0000 mg | ORAL_TABLET | Freq: Two times a day (BID) | ORAL | Status: DC

## 2012-06-28 MED ORDER — NITROGLYCERIN 0.4 MG SL SUBL: 0.4000 mg | SUBLINGUAL_TABLET | SUBLINGUAL | Status: DC | PRN

## 2012-06-28 MED ORDER — TICAGRELOR 90 MG PO TABS
90.0000 mg | ORAL_TABLET | Freq: Two times a day (BID) | ORAL | Status: DC
  Administered 2012-06-28: 10:00:00 90 mg via ORAL
  Filled 2012-06-28 (×2): qty 1

## 2012-06-28 MED FILL — Dextrose Inj 5%: INTRAVENOUS | Qty: 50 | Status: AC

## 2012-06-28 NOTE — Progress Notes (Signed)
Subjective:  Feels well. Ambulated without difficulty. No SOB No CP  Objective:  Vital Signs in the last 24 hours: Temp:  [97.7 F (36.5 C)-98.2 F (36.8 C)] 97.7 F (36.5 C) (10/15 0816) Pulse Rate:  [50-64] 56  (10/15 0816) Resp:  [10-18] 13  (10/15 0816) BP: (110-129)/(71-85) 121/85 mmHg (10/15 0816) SpO2:  [96 %-100 %] 98 % (10/15 0816) Weight:  [92.9 kg (204 lb 12.9 oz)] 92.9 kg (204 lb 12.9 oz) (10/15 0032)  Intake/Output from previous day: 10/14 0701 - 10/15 0700 In: 928.8 [P.O.:460; I.V.:468.8] Out: 350 [Urine:350]   Physical Exam: General: Well developed, well nourished, in no acute distress. Head:  Normocephalic and atraumatic. Lungs: Clear to auscultation and percussion. Heart: Normal S1 and S2.  No murmur, rubs or gallops.  Abdomen: soft, non-tender, positive bowel sounds. Extremities: No clubbing or cyanosis. No edema. Groin site C/D/I, normal pulses.  Neurologic: Alert and oriented x 3.    Lab Results:  Basename 06/28/12 0445 06/26/12 0748  WBC 7.4 11.4*  HGB 13.5 14.2  PLT 200 219    Basename 06/28/12 0445 06/26/12 0748  NA 138 139  K 4.0 3.9  CL 103 104  CO2 25 23  GLUCOSE 195* 232*  BUN 14 16  CREATININE 0.93 0.92    Basename 06/26/12 0118 06/25/12 1845  TROPONINI <0.30 <0.30   Hepatic Function Panel No results found for this basename: PROT,ALBUMIN,AST,ALT,ALKPHOS,BILITOT,BILIDIR,IBILI in the last 72 hours  Basename 06/25/12 1233  CHOL 195    Telemetry: Occas brady Personally viewed.   Cardiac Studies:  Cath 10/14. DES to OM and LAD  PERCUTANEOUS CORONARY INTERVENTION  Michael Guzman is a 66 y.o. male  INDICATION: 66 year old gentleman with exertional chest discomfort admitted with unstable angina and demonstrated to have high-grade mid LAD and obtuse marginal #2 disease by catheterization this morning.  PROCEDURE: 1. Drug-eluting stent obtuse marginal #2.  2. Drug-eluting stent mid LAD  CONSENT:  The risks, benefits, and  details of the procedure were explained to the patient. Risks including death, MI, stroke, bleeding, limb ischemia, renal failure and allergy were described and accepted by the patient. Informed written consent was obtained prior to proceeding.  PROCEDURE TECHNIQUE: After Xylocaine anesthesia a 6 French sheath was placed in the right femoral artery in exchange for the 5 French sheath placed by Dr. Mayford Knife during the diagnostic procedure earlier this morning (approximately 90 minutes ago). Coronary guiding shots were made using a 6 French 3.5 cm CLS guide catheter. Antithrombotic therapy, Angiomax bolus and infusion, was begun and determined to be therapeutic by ACT. Antiplatelet therapy, study drug (Bralinta or Plavix), was loaded.  A Prowater wire was used to obtain access across the second obtuse marginal. Predilatation with a 2.0 balloon followed by a 2.5 x 16 mm Promus element stent. The stent was deployed and we noted a distal edge dissection. A second overlapping 2.25 x 8 mm stent was deployed. Post dilatation with a 2.5 x 12 mm balloon to 14 atmospheres was performed. A good angiographic result was obtained.  We then turned attention to the LAD lesion. The BMW wire was directed down the LAD and across the LAD stenosis. A 6 mm long by 2.5 mm diameter cutting balloon was used for predilatation. We then positioned and deployed a 16 mm long by 3.0 mm diameter Promus elements stent. Post dilatation was performed with a 12 mm long by 3.0 mm Perry balloon to 14 atmospheres. No complications occurred .  Mynx closure device was used  with good hemostasis.  CONTRAST: Total of 105 cc.  COMPLICATIONS: None.  ANGIOGRAPHIC RESULTS: 1. The obtuse marginal stenosis was reduced from 99% to 0% with TIMI grade 3 flow  2. The LAD stenosis was reduced from 80% to 0% with TIMI grade 3 flow  IMPRESSIONS: 1. Successful double vessel drug-eluting stent implantation in this 66 year old diabetic with reduction in the LAD stenosis  from 80-0%, and obtuse marginal #2 stenosis from 99% to 0%.  RECOMMENDATION: 1. Discharge in a.m. per Dr. Anne Fu. The patient is enrolled in a research trial with Brilinta.    Assessment/Plan:  Principal Problem:  *Accelerating angina Active Problems:  Chest pain  Hypertension  Diabetes mellitus  GERD (gastroesophageal reflux disease)  Metabolic acidosis, increased anion gap  -Gave Rx for Brilinta 90mg  BID one month supply -Cardiac rehab -Expressed the importance of dual anti-platelet therapy  -I am OK with discharge home. -I have set up follow up with cardiology.  Thank you for letting us participate in his care.   [name] 06/28/2012, 9:48 AM

## 2012-06-28 NOTE — Progress Notes (Signed)
Medical Student Daily Progress Note   Subjective:    Interval Events:  Patient feels good, no complications from procedure. No CP, SOB, or palpitations. No noticeable skin changes by patient. He repeated to me his understanding of disease process. Again, spoke about lifestyle modifications, patient receptive.    Objective:    Vital Signs:   Temp:  [97.7 F (36.5 C)-98.2 F (36.8 C)] 97.7 F (36.5 C) (10/15 0816) Pulse Rate:  [50-64] 56  (10/15 0816) Resp:  [10-18] 13  (10/15 0816) BP: (110-129)/(71-85) 121/85 mmHg (10/15 0816) SpO2:  [96 %-100 %] 98 % (10/15 0816) Weight:  [92.9 kg (204 lb 12.9 oz)] 92.9 kg (204 lb 12.9 oz) (10/15 0032) Last BM Date: 06/27/12   Weights: 24-hour Weight change:   Filed Weights   06/25/12 0716 06/28/12 0032  Weight: 91.627 kg (202 lb) 92.9 kg (204 lb 12.9 oz)     Intake/Output:   Intake/Output Summary (Last 24 hours) at 06/28/12 0936 Last data filed at 06/27/12 1900  Gross per 24 hour  Intake 928.75 ml  Output    350 ml  Net 578.75 ml       Physical Exam: General appearance: alert, cooperative and no distress Resp: clear to auscultation bilaterally Cardio: regular rate and rhythm, S1, S2 normal, no murmur, click, rub or gallop Extremities: no new skin findings observed Pulses: 2+ and symmetric    Labs: Basic Metabolic Panel:  Lab 06/28/12 2956 06/26/12 0748 06/25/12 1845 06/25/12 1233 06/25/12 0725  NA 138 139 135 -- 137  K 4.0 3.9 4.0 -- 4.4  CL 103 104 101 -- 101  CO2 25 23 22  -- 18*  GLUCOSE 195* 232* 343* -- 245*  BUN 14 16 16  -- 15  CREATININE 0.93 0.92 1.02 -- 0.90  CALCIUM 9.1 9.5 9.8 -- --  MG -- -- -- 2.0 --  PHOS -- -- -- -- --    Liver Function Tests: No results found for this basename: AST:5,ALT:5,ALKPHOS:5,BILITOT:5,PROT:5,ALBUMIN:5 in the last 168 hours No results found for this basename: LIPASE:5,AMYLASE:5 in the last 168 hours No results found for this basename: AMMONIA:3 in the last 168  hours  CBC:  Lab 06/28/12 0445 06/26/12 0748 06/25/12 0725  WBC 7.4 11.4* 16.3*  NEUTROABS -- -- --  HGB 13.5 14.2 14.8  HCT 40.1 41.5 42.0  MCV 86.6 86.6 85.7  PLT 200 219 234    Cardiac Enzymes:  Lab 06/26/12 0118 06/25/12 1845 06/25/12 1233  CKTOTAL -- -- --  CKMB -- -- --  CKMBINDEX -- -- --  TROPONINI <0.30 <0.30 <0.30    BNP (last 3 results):  Basename 06/25/12 0726  PROBNP 168.4*    CBG:  Lab 06/28/12 0814 06/27/12 2134 06/27/12 1734 06/27/12 1456 06/27/12 1152  GLUCAP 186* 179* 207* 157* 176*    Coagulation Studies:  Basename 06/25/12 1845  LABPROT 14.2  INR 1.11    Microbiology: No results found for this or any previous visit.  Other results: Angiographic Results: 1. The obtuse marginal stenosis was reduced from 99% to 0% with TIMI grade 3 flow  2. The LAD stenosis was reduced from 80% to 0% with TIMI grade 3 flow   Imaging: No results found.    Medications:    Infusions:    . sodium chloride 125 mL/hr at 06/27/12 1515  . DISCONTD: sodium chloride 1 mL/kg/hr (06/27/12 0325)  . DISCONTD: bivalirudin (ANGIOMAX) infusion 5 mg/mL (Cath Lab,ACS,PCI indication)    . DISCONTD: heparin 1,500 Units/hr (06/27/12 0325)  Scheduled Medications:    . aspirin EC  325 mg Oral Daily  . atenolol  100 mg Oral Daily  . atorvastatin  80 mg Oral q1800  . bivalirudin      . ceFAZolin      . clopidogrel  75 mg Oral Q breakfast  . diazepam  5 mg Oral On Call  . diltiazem  120 mg Oral Daily  . enalapril  20 mg Oral Daily  . fentaNYL      . fentaNYL      . heparin      . heparin      . insulin aspart  0-9 Units Subcutaneous TID WC  . lidocaine      . living well with diabetes book   Does not apply Once  . midazolam      . midazolam      . montelukast  10 mg Oral QHS  . nitroGLYCERIN      . pantoprazole  40 mg Oral Daily  . polyethylene glycol  17 g Oral Daily  . DISCONTD: aspirin EC  81 mg Oral Daily  . DISCONTD: nabumetone  750 mg Oral BID   . DISCONTD: sodium chloride  3 mL Intravenous Q12H  . DISCONTD: sodium chloride  3 mL Intravenous Q12H  . DISCONTD: sodium chloride  3 mL Intravenous Q12H     PRN Medications: acetaminophen, nitroGLYCERIN, ondansetron (ZOFRAN) IV, oxyCODONE-acetaminophen, DISCONTD: sodium chloride, DISCONTD: acetaminophen, DISCONTD: acetaminophen, DISCONTD:  morphine injection, DISCONTD: sodium chloride   Assessment/ Plan:    66 yo [place] American male w/ hx of HTN, diabetes, and GERD is admitted for chest pain.   1) Chest pain - Patient presents with history and symptoms consistent with unstable angina. Patient had negative EKG findings and troponin I x 3 were negative making other NSTEMI or STEMI unlikely. CXR negative for pneumothorax, mediastinal widening or pulmonary edema. Elevated pro-BNP at 168.4 is also suggestive of heart failure and the patient presents with 2-3 mo history of SOB w/ activity and fatigue, although does not appear volume overloaded. 2D echo shows grade I diastolic dysfunction and EF of 55-60%. Cardiology has been consulted and a cardiac cath has been performed Monday 10/14.   --Per cardiology: IV heparin, high dose atorvastatin, aspirin, nitroglycerin PRN. Continue home atenolol  --Risk stratification: LDL 126, TSH 0.099 (L), T4 1.02, HbA1c 8.5  --Cath results: 1) The obtuse marginal stenosis was reduced from 99% to 0% with TIMI grade 3 flow, 2) The LAD stenosis was reduced from 80% to 0% with TIMI grade 3 flow  2) Leukocytosis - Likely 2/2 to recent prednisone. Low clinical suspicion for infection.   3) Metabolic acidosis - Resolved. On presentation patient had AG of 18, delta gap of 1. Salicylate level was WNL. Urine negative for ketones. Denies alcohol use or recent drug ingestions. Lactic acid level mildly elevated to 2.7, suggestive of possible tissue hypoperfusion, but gap has resolved.   4) Hypertension: Continue enalapril, diltiazem at home doses   5) Diabetes. HbA1c 8.5.  He requires better diabetic control and close follow up as outpatient. Hold home medications.  --SSI  --Diabetic educator to see him   6) Subclinical hyperthyroidism: Patient has TSH of 0.099 and normal free T4. He does not endorse any symptoms of hyperthyroidism. PE does not show any enlarged thyroid. Patient is already on atenolol 100mg , but will require close outpatient follow-up. Patient says Dr. Oneta Rack is already aware of this issue.   7) GERD: Continue home regimen, pantoprazole.  8) Chronic back pain: Continue home regimen of nabumetone.   9) PPX:  DVT: IV heparin  PUD: pantoprazole   LOS: 3d   Wynelle Link, MS4  June 28, 2012    This is a Psychologist, occupational Note.  The care of the patient was discussed with Dr. Loistine Chance and the assessment and plan formulated with their assistance.  Please see their attached note or addendum for official documentation of the daily encounter.  I have reviewed and agree with Medical student Wynelle Link. Please see my separate note .   Almyra Deforest, MD

## 2012-06-28 NOTE — Care Management Note (Signed)
    Page 1 of 1   06/28/2012     11:17:57 AM   CARE MANAGEMENT NOTE 06/28/2012  Patient:  Michael Guzman, Michael Guzman   Account Number:  [id number]  Date Initiated:  06/28/2012  Documentation initiated by:  CRAFT,TERRI  Subjective/Objective Assessment:   66 yo male admitted 06/25/12 with accelerating angina     Action/Plan:   D/C when medically stable.   Anticipated DC Date:  07/01/2012   Anticipated DC Plan:  HOME/SELF CARE      DC Planning Services  CM consult  Medication Assistance             Status of service:  Completed, signed off  Discharge Disposition:  HOME/SELF CARE  Per UR Regulation:  Reviewed for med. necessity/level of care/duration of stay  Comments:  06/28/12, Kathi Der RNC-MNN, BSN, [phone], CM received referral.  CM met with pt.  Pt has Brilinta card. Discussed pharmacy discounts.

## 2012-06-28 NOTE — Progress Notes (Signed)
CARDIAC REHAB PHASE I   PRE:  Rate/Rhythm: 73SR  BP:  Supine:   Sitting: 132/84  Standing:    SaO2:   MODE:  Ambulation: 600 ft   POST:  Rate/Rhythem: 71SR  BP:  Supine:   Sitting: 143/85  Standing:    SaO2:  0850-1000 Pt walked 600 ft on RA with steady gait,. Denied CP or SOB. Tolerated well. Education completed with pt and wife. Encouraged carb counting. Pt states he has not smoked in almost a year. Congratulations given . Told pt about [id number] if he needs to talk to coach. 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 Permission given to refer to [facility].  Duanne Limerick

## 2012-06-28 NOTE — Discharge Summary (Signed)
Internal Medicine Teaching [facility] Discharge Note  Name: Michael Guzman MRN: [id number] DOB: 1946/06/25 66 y.o.  Date of Admission: 06/25/2012  7:20 AM Date of Discharge: 06/28/2012 Attending Physician: No att. providers found  Discharge Diagnosis: Principal Problem: S/P percutaneous coronary angioplasty Active Problems:  Hypertension  Diabetes mellitus, Type 2 with Hgb A1c 8.5  GERD (gastroesophageal reflux disease)  Metabolic acidosis, increased anion gap Hyperlipidemia LDL goal <100 Subclinical hyperthyroidism  Discharge Medications:   Medication List     As of 06/28/2012  1:41 PM    STOP taking these medications         levofloxacin 500 MG tablet   Commonly known as: LEVAQUIN      predniSONE 20 MG tablet   Commonly known as: DELTASONE      sitaGLIPtan-metformin 50-1000 MG per tablet   Commonly known as: JANUMET      TAKE these medications         aspirin 81 MG tablet   Take 81 mg by mouth daily.      atenolol 100 MG tablet   Commonly known as: TENORMIN   Take 100 mg by mouth daily.      atorvastatin 40 MG tablet   Commonly known as: LIPITOR   Take 1 tablet (40 mg total) by mouth daily at 6 PM.      diltiazem 120 MG 24 hr capsule   Commonly known as: CARDIZEM CD   Take 120 mg by mouth daily.      enalapril 20 MG tablet   Commonly known as: VASOTEC   Take 20 mg by mouth daily.      FISH OIL PO   Take 1 tablet by mouth daily.      Flaxseed (Linseed) 1000 MG Caps   Take 1,000 mg by mouth daily.      glyBURIDE 5 MG tablet   Commonly known as: DIABETA   Take 5 mg by mouth 2 (two) times daily with a meal.      Magnesium 250 MG Tabs   Take 250 mg by mouth daily.      montelukast 10 MG tablet   Commonly known as: SINGULAIR   Take 10 mg by mouth at bedtime.      nabumetone 750 MG tablet   Commonly known as: RELAFEN   Take 750 mg by mouth 2 (two) times daily.      nitroGLYCERIN 0.4 MG SL tablet   Commonly known as: NITROSTAT   Place 1 tablet (0.4 mg total) under the tongue every 5 (five) minutes x 3 doses as needed for chest pain.      pantoprazole 40 MG tablet   Commonly known as: PROTONIX   Take 40 mg by mouth daily.      Ticagrelor 90 MG Tabs tablet   Commonly known as: BRILINTA   Take 1 tablet (90 mg total) by mouth 2 (two) times daily.      VITAMIN D PO   Take 1 tablet by mouth daily.         Disposition and follow-up:   Michael Guzman was discharged from [facility] in stable condition.  At the hospital follow up visit please address : 1. Compliance for medication 2. Assure that patient has restarted [place] since it was held for 3 days after cath.  4. Patient needs readjustment for his diabetes considering his Hgb A1c of 8.5 5. Patient was started on a statin during this hospital admission 6. Patient was referred  to cardiac rehab  7. Patient need a follow up TSH as outpatient in 4-6 weeks  8. Patient should avoid NSAID since patient was discharged on Aspirin and Brilinta.  Follow-up Appointments: Follow-up Information    Follow up with FERGUSON,CYNTHIA A, NP. On 07/11/2012. (1:30pm)    Contact information:   [facility] [address] [place] [address] [place] [address] [phone]       Follow up with Nadean Corwin, MD. On 06/29/2012. (4:30, [place], [place])    Contact information:   [phone] [address] [place] [address] [phone]         Discharge Orders    Future Orders Please Complete By Expires   Amb Referral to Cardiac Rehabilitation      Diet - low sodium heart healthy      Increase activity slowly      Discharge instructions      Comments:   1. Hold sitagliptan -metformin for 2 more days since you had a cath then restart  2. Make sure to follow up with your primary care doctor and heart doctor 3. Please take your medication as prescribed.   Call MD for:  temperature >100.4      Call MD for:   persistant nausea and vomiting      Call MD for:  severe uncontrolled pain      Call MD for:  redness, tenderness, or signs of infection (pain, swelling, redness, odor or green/yellow discharge around incision site)      Call MD for:  difficulty breathing, headache or visual disturbances      Call MD for:  persistant dizziness or light-headedness         Consultations: Treatment Team:  Donato Schultz, MD  Procedures Performed:  Dg Chest Port 1 View  06/25/2012  *RADIOLOGY REPORT*  Clinical Data: Chest pain and shortness of breath  PORTABLE CHEST - 1 VIEW  Comparison: 06/24/2011  Findings: The cardiomediastinal silhouette is within normal limits. Lungs are clear.  No pneumothorax.  IMPRESSION: Negative.   Original Report Authenticated By: Donavan Burnet, M.D.     2D Echo: 06/25/12 Study Conclusions  - Left ventricle: The cavity size was normal. There was mild concentric hypertrophy. Systolic function was normal. The estimated ejection fraction was in the range of 55% to 60%. Wall motion was normal; there were no regional wall motion abnormalities. Doppler parameters are consistent with abnormal left ventricular relaxation (grade 1 diastolic dysfunction). - Pulmonary arteries: PA peak pressure: 32mm Hg (S). Transthoracic echocardiography. M-mode, complete 2D, spectral Doppler, and color Doppler. Height: Height: 177.8cm. Height: 70in. Weight: Weight: 91.6kg. Weight: 201.5lb. Body mass index: BMI: 29kg/m^2. Body surface area: BSA: 2.34m^2. Blood pressure: 134/90. Patient status: Inpatient. Location: Bedside.  ------------------------------------------------------------  ------------------------------------------------------------ Left ventricle: The cavity size was normal. There was mild concentric hypertrophy. Systolic function was normal. The estimated ejection fraction was in the range of 55% to 60%. Wall motion was normal; there were no regional wall motion abnormalities.  Doppler parameters are consistent with abnormal left ventricular relaxation (grade 1 diastolic dysfunction).  ------------------------------------------------------------ Aortic valve: Mildly calcified leaflets. Cusp separation was normal. Doppler: Transvalvular velocity was within the normal range. There was no stenosis. No regurgitation.  ------------------------------------------------------------ Aorta: The aorta was normal, not dilated, and non-diseased.  ------------------------------------------------------------ Mitral valve: Mildly thickened leaflets . Leaflet separation was normal. Doppler: Transvalvular velocity was within the normal range. There was no evidence for stenosis. Trivial regurgitation.  ------------------------------------------------------------ Left atrium: The atrium was normal in  size.  ------------------------------------------------------------ Right ventricle: The cavity size was normal. Wall thickness was normal. Systolic function was normal.  ------------------------------------------------------------ Pulmonic valve: Structurally normal valve. Cusp separation was normal. Doppler: Transvalvular velocity was within the normal range. Trivial regurgitation.  ------------------------------------------------------------ Tricuspid valve: Structurally normal valve. Leaflet separation was normal. Doppler: Transvalvular velocity was within the normal range. Trivial regurgitation.  ------------------------------------------------------------ Pulmonary artery: The main pulmonary artery was normal-sized.  ------------------------------------------------------------ Right atrium: The atrium was normal in size.  ------------------------------------------------------------ Pericardium: The pericardium was normal in appearance. There was no pericardial effusion.  ------------------------------------------------------------ Systemic veins: Inferior vena  cava: The vessel was normal in size; the respirophasic diameter changes were in the normal range (= 50%); findings are consistent with normal central venous pressure.  ------------------------------------------------------------     Cardiac Cath: 06/27/12 per Dr Verdis Prime   1. The obtuse marginal stenosis was reduced from 99% to 0% with TIMI grade 3 flow  2. The LAD stenosis was reduced from 80% to 0% with TIMI grade 3 flow   IMPRESSIONS:  1. Successful double vessel drug-eluting stent implantation in this 65 year old diabetic with reduction in the LAD stenosis from 80-0%, and obtuse marginal #2 stenosis from 99% to 0%.      Admission HPI:   This is a 66 year old african American male with PMH significant for HTN, Diabetes, and GERD who presented to the ED with chest pain. Patient reports that since more then he had been experiencing chest "tightness" which had been progressively getting worse since one month. He reports an event when he was in [place] where he had to stop at least 3 times to walk 3 blocks because he felt short winded. Since then he noticed symptoms with minimal exertion including using steps or walking on ground level. The pain is a feeling of tightness, pressure like and sometimes a burning feeling. It is localized in the mid sternum without radiation. It is associated with SOB and diaphoresis . Last night he reported that he was working on his boat and start to experience again similar symptoms but it got progressively worse with 9/10 in severity. It was releaved in the ED after taking Aspirin. When evaluated by [place] he noted that it still feels kind of soar.  He further reports about some dizziness/ lightheadedness which is mostly when stands up. Denies any headache, vision changes, weakness or numbness or any episode of LOC. There are days when he experience it at least 6 times a day.  Patient follows up with his PCP on a regular basis. Was last evaluated on Thursday  for persistent rhinorrhea and was prescribed prednisone and Levaquin .ENT evaluated him prior to that and per patient did not need any further evaluation and management.   Hospital Course by problem list:  #Chest pain due to unstable angina s/p percutaneous coronary angioplasty.   Patient had negative EKG findings and troponin I x 3 were negative. Elevated pro-BNP at 168.4 however 2D echo showed grade I diastolic dysfunction and EF of 55-60%. The patient was started on IV heparin, high dose atorvastatin, aspirin, nitroglycerin PRN. Home dose of atenolol was continued. Patient underwent  percutaneous coronary angioplasty on 06/27/12  Pertinent lab values are as follows: LDL 126, TSH 0.099, T4 1.02, HbA1c 8.5. See below for discussion on diabetes and subclinical hyperthyroidism. Given patient's history, LDL levels are not at goal of 70. Recommend to continue high dose statin started during his hospital course to attain LDL goal plus strict follow-up. Patient was started on  80mg  atorvastatin qDay. Lifestyle changes, such as low fat, low salt diet and increased physical activity were encouraged during his hospital course, but it will be good for patient to receive encouragement in the outpatient setting. Patient is currently overweight with a BMI of 29. He will need further counseling about improvoing weight reduction.  #Leukocytosis - On presentation, the patient had an elevated WBC to 16.3. This elevation is most likely 2/2 to recent prednisone intake. Throughout his hospital course, our team had a low clinical suspicion for infection. Chest xray and Urine were negative for any signs of infection. We discontinued his levofloxacin and predisone during his hospital course.  #Metabolic acidosis - On presentation patient had AG of 18, delta gap of 1. Salicylate level was WNL. Urine negative for ketones. The patient denied alcohol use or recent drug ingestions. Lactic acid level mildly elevated to 2.7, suggestive  of possible tissue hypoperfusion, but gap resolved on 10/11.   # Hypertension -The patient had stable blood pressures during his hospital course. We continued the patient's home regimen of enalapril, diltiazem at home doses.   #Diabetes mellitus type 2 -The patient has HbA1c 8.5. During his hospital course, his diabetes was managed with sliding scale insulin. We had a diabetic educator see him during his hospital course. He says that he sees an ophthalmologist yearly for follow up and that he takes his diabetes medications. Given his HbA1c and cardiac comorbidity, he requires stricter diabetic control and close follow up as outpatient. Could adjust to higher metformin dose.   #Subclinical hyperthyroidism: Patient has TSH of 0.099 uIU/ml and normal free T4. He does not endorse any symptoms of hyperthyroidism. PE does not show any enlarged thyroid. Patient is already on atenolol 100mg , but will require close outpatient follow-up, as 15% can convert to overt hyperthyroidism and could contribute to his coronary heart disease or increase his risk for developing atrial fibrillation. His low TSH < 0.1 mU/L puts him at this increased risk for developing heart disease. Patient says Dr. Oneta Rack is already aware of this issue.   #GERD - We managed the patient's GERD by continuing his home regimen of pantoprazole.   #Chronic back pain - The patient has had several years of chronic low back pain managed in the outpatient setting which he says has been attributed to a "slipped disc". He says it does not bother him as long as he can take NSAIDs as needed. Patient should avoid NSAID since patient was discharged on Aspirin and Brilinta.    Discharge Vitals:  BP 121/85  Pulse 56  Temp 97.7 F (36.5 C) (Oral)  Resp 13  Ht 5\' 10"  (1.778 m)  Wt 204 lb 12.9 oz (92.9 kg)  BMI 29.39 kg/m2  SpO2 98%  Discharge Labs:  Results for orders placed during the hospital encounter of 06/25/12 (from the past 24 hour(s))    GLUCOSE, CAPILLARY     Status: Abnormal   Collection Time   06/27/12  2:56 PM      Component Value Range   Glucose-Capillary 157 (*) 70 - 99 mg/dL  GLUCOSE, CAPILLARY     Status: Abnormal   Collection Time   06/27/12  5:34 PM      Component Value Range   Glucose-Capillary 207 (*) 70 - 99 mg/dL  GLUCOSE, CAPILLARY     Status: Abnormal   Collection Time   06/27/12  9:34 PM      Component Value Range   Glucose-Capillary 179 (*) 70 - 99 mg/dL  Comment 1 Notify RN     Comment 2 Documented in Chart    CBC     Status: Normal   Collection Time   06/28/12  4:45 AM      Component Value Range   WBC 7.4  4.0 - 10.5 K/uL   RBC 4.63  4.22 - 5.81 MIL/uL   Hemoglobin 13.5  13.0 - 17.0 g/dL   HCT 29.5  62.1 - 30.8 %   MCV 86.6  78.0 - 100.0 fL   MCH 29.2  26.0 - 34.0 pg   MCHC 33.7  30.0 - 36.0 g/dL   RDW 65.7  84.6 - 96.2 %   Platelets 200  150 - 400 K/uL  BASIC METABOLIC PANEL     Status: Abnormal   Collection Time   06/28/12  4:45 AM      Component Value Range   Sodium 138  135 - 145 mEq/L   Potassium 4.0  3.5 - 5.1 mEq/L   Chloride 103  96 - 112 mEq/L   CO2 25  19 - 32 mEq/L   Glucose, Bld 195 (*) 70 - 99 mg/dL   BUN 14  6 - 23 mg/dL   Creatinine, Ser 9.52  0.50 - 1.35 mg/dL   Calcium 9.1  8.4 - 84.1 mg/dL   GFR calc non Af Amer 86 (*) >90 mL/min   GFR calc Af Amer >90  >90 mL/min  GLUCOSE, CAPILLARY     Status: Abnormal   Collection Time   06/28/12  8:14 AM      Component Value Range   Glucose-Capillary 186 (*) 70 - 99 mg/dL    Signed: Krayton Wortley 06/28/2012, 1:41 PM   Time Spent on Discharge: 40 min

## 2012-06-30 ENCOUNTER — Other Ambulatory Visit: Payer: Self-pay | Admitting: Internal Medicine

## 2012-06-30 DIAGNOSIS — R7989 Other specified abnormal findings of blood chemistry: Secondary | ICD-10-CM

## 2012-07-07 ENCOUNTER — Ambulatory Visit
Admission: RE | Admit: 2012-07-07 | Discharge: 2012-07-07 | Disposition: A | Discharge status: Home / Self Care | Payer: BC Managed Care – PPO | Source: Ambulatory Visit | Attending: Internal Medicine | Admitting: Internal Medicine

## 2012-07-07 ENCOUNTER — Other Ambulatory Visit: Payer: Self-pay | Admitting: Internal Medicine

## 2012-07-07 DIAGNOSIS — R7989 Other specified abnormal findings of blood chemistry: Secondary | ICD-10-CM

## 2012-07-23 ENCOUNTER — Encounter (HOSPITAL_COMMUNITY): Payer: Self-pay | Admitting: *Deleted

## 2012-07-23 ENCOUNTER — Emergency Department (HOSPITAL_COMMUNITY): Payer: BC Managed Care – PPO

## 2012-07-23 ENCOUNTER — Observation Stay (HOSPITAL_COMMUNITY)
Admission: EM | Admit: 2012-07-23 | Discharge: 2012-07-26 | Disposition: A | Discharge status: Home / Self Care | Payer: BC Managed Care – PPO | Attending: Cardiology | Admitting: Cardiology

## 2012-07-23 DIAGNOSIS — R040 Epistaxis: Secondary | ICD-10-CM

## 2012-07-23 DIAGNOSIS — E1169 Type 2 diabetes mellitus with other specified complication: Secondary | ICD-10-CM | POA: Diagnosis present

## 2012-07-23 DIAGNOSIS — I200 Unstable angina: Secondary | ICD-10-CM

## 2012-07-23 DIAGNOSIS — Z9861 Coronary angioplasty status: Secondary | ICD-10-CM | POA: Insufficient documentation

## 2012-07-23 DIAGNOSIS — I10 Essential (primary) hypertension: Secondary | ICD-10-CM

## 2012-07-23 DIAGNOSIS — E669 Obesity, unspecified: Secondary | ICD-10-CM | POA: Insufficient documentation

## 2012-07-23 DIAGNOSIS — K219 Gastro-esophageal reflux disease without esophagitis: Secondary | ICD-10-CM

## 2012-07-23 DIAGNOSIS — R0789 Other chest pain: Principal | ICD-10-CM | POA: Insufficient documentation

## 2012-07-23 DIAGNOSIS — I2510 Atherosclerotic heart disease of native coronary artery without angina pectoris: Secondary | ICD-10-CM

## 2012-07-23 DIAGNOSIS — E785 Hyperlipidemia, unspecified: Secondary | ICD-10-CM

## 2012-07-23 DIAGNOSIS — R079 Chest pain, unspecified: Secondary | ICD-10-CM

## 2012-07-23 DIAGNOSIS — E119 Type 2 diabetes mellitus without complications: Secondary | ICD-10-CM | POA: Insufficient documentation

## 2012-07-23 HISTORY — DX: Atherosclerotic heart disease of native coronary artery without angina pectoris: I25.10

## 2012-07-23 LAB — CBC
HCT: 41.3 % (ref 39.0–52.0)
MCHC: 34.6 g/dL (ref 30.0–36.0)
MCV: 85.6 fL (ref 78.0–100.0)
MCV: 85.2 fL (ref 78.0–100.0)
Platelets: 208 10*3/uL (ref 150–400)
Platelets: 209 10*3/uL (ref 150–400)
RBC: 4.85 MIL/uL (ref 4.22–5.81)
RDW: 12.2 % (ref 11.5–15.5)
RDW: 12 % (ref 11.5–15.5)
WBC: 7.1 10*3/uL (ref 4.0–10.5)
WBC: 8 10*3/uL (ref 4.0–10.5)

## 2012-07-23 LAB — BASIC METABOLIC PANEL
CO2: 22 mEq/L (ref 19–32)
Chloride: 100 mEq/L (ref 96–112)
Creatinine, Ser: 1.02 mg/dL (ref 0.50–1.35)
GFR calc Af Amer: 87 mL/min — ABNORMAL LOW (ref >90)
Potassium: 4.4 mEq/L (ref 3.5–5.1)
Sodium: 135 mEq/L (ref 135–145)

## 2012-07-23 LAB — CREATININE, SERUM
GFR calc Af Amer: 85 mL/min — ABNORMAL LOW (ref >90)
GFR calc non Af Amer: 73 mL/min — ABNORMAL LOW (ref >90)

## 2012-07-23 LAB — TROPONIN I: Troponin I: <0.3 ng/mL (ref <0.30)

## 2012-07-23 LAB — POCT I-STAT TROPONIN I: Troponin i, poc: 0 ng/mL (ref 0.00–0.08)

## 2012-07-23 LAB — GLUCOSE, CAPILLARY: Glucose-Capillary: 183 mg/dL — ABNORMAL HIGH (ref 70–99)

## 2012-07-23 MED ORDER — ACETAMINOPHEN 325 MG PO TABS: 650.0000 mg | ORAL_TABLET | ORAL | Status: DC | PRN

## 2012-07-23 MED ORDER — ASPIRIN 81 MG PO TABS: 81.0000 mg | ORAL_TABLET | Freq: Every day | ORAL | Status: DC

## 2012-07-23 MED ORDER — ASPIRIN EC 81 MG PO TBEC
81.0000 mg | DELAYED_RELEASE_TABLET | Freq: Every day | ORAL | Status: DC
  Administered 2012-07-24: 81 mg via ORAL
  Filled 2012-07-23: qty 1

## 2012-07-23 MED ORDER — NITROGLYCERIN 0.4 MG SL SUBL: 0.4000 mg | SUBLINGUAL_TABLET | SUBLINGUAL | Status: DC | PRN

## 2012-07-23 MED ORDER — LINAGLIPTIN 5 MG PO TABS
5.0000 mg | ORAL_TABLET | Freq: Two times a day (BID) | ORAL | Status: DC
  Administered 2012-07-24 – 2012-07-25 (×3): 5 mg via ORAL
  Filled 2012-07-23 (×9): qty 1

## 2012-07-23 MED ORDER — SITAGLIPTIN PHOS-METFORMIN HCL 50-1000 MG PO TABS: 1.0000 | ORAL_TABLET | Freq: Two times a day (BID) | ORAL | Status: DC

## 2012-07-23 MED ORDER — GLYBURIDE 5 MG PO TABS
5.0000 mg | ORAL_TABLET | Freq: Two times a day (BID) | ORAL | Status: DC
  Administered 2012-07-24 – 2012-07-25 (×3): 5 mg via ORAL
  Filled 2012-07-23 (×9): qty 1

## 2012-07-23 MED ORDER — MAGNESIUM OXIDE 400 (241.3 MG) MG PO TABS
200.0000 mg | ORAL_TABLET | Freq: Every day | ORAL | Status: DC
  Administered 2012-07-23 – 2012-07-26 (×4): 200 mg via ORAL
  Filled 2012-07-23 (×4): qty 0.5

## 2012-07-23 MED ORDER — OMEGA-3-ACID ETHYL ESTERS 1 G PO CAPS
1.0000 g | ORAL_CAPSULE | Freq: Every day | ORAL | Status: DC
  Administered 2012-07-23 – 2012-07-26 (×4): 1 g via ORAL
  Filled 2012-07-23 (×4): qty 1

## 2012-07-23 MED ORDER — ATENOLOL 100 MG PO TABS
100.0000 mg | ORAL_TABLET | Freq: Every day | ORAL | Status: DC
  Administered 2012-07-23 – 2012-07-26 (×4): 100 mg via ORAL
  Filled 2012-07-23 (×4): qty 1

## 2012-07-23 MED ORDER — MONTELUKAST SODIUM 10 MG PO TABS
10.0000 mg | ORAL_TABLET | Freq: Every day | ORAL | Status: DC
  Administered 2012-07-23 – 2012-07-25 (×3): 10 mg via ORAL
  Filled 2012-07-23 (×5): qty 1

## 2012-07-23 MED ORDER — ENALAPRIL MALEATE 20 MG PO TABS
20.0000 mg | ORAL_TABLET | Freq: Every day | ORAL | Status: DC
  Administered 2012-07-23 – 2012-07-26 (×4): 20 mg via ORAL
  Filled 2012-07-23 (×4): qty 1

## 2012-07-23 MED ORDER — ONDANSETRON HCL 4 MG/2ML IJ SOLN: 4.0000 mg | Freq: Four times a day (QID) | INTRAMUSCULAR | Status: DC | PRN

## 2012-07-23 MED ORDER — METFORMIN HCL 500 MG PO TABS
1000.0000 mg | ORAL_TABLET | Freq: Two times a day (BID) | ORAL | Status: DC
  Filled 2012-07-23 (×5): qty 2

## 2012-07-23 MED ORDER — SODIUM CHLORIDE 0.9 % IJ SOLN
3.0000 mL | Freq: Two times a day (BID) | INTRAMUSCULAR | Status: DC
  Administered 2012-07-23 – 2012-07-25 (×3): 3 mL via INTRAVENOUS

## 2012-07-23 MED ORDER — CHOLECALCIFEROL 10 MCG (400 UNIT) PO TABS
400.0000 [IU] | ORAL_TABLET | Freq: Every day | ORAL | Status: DC
  Administered 2012-07-23 – 2012-07-26 (×4): 400 [IU] via ORAL
  Filled 2012-07-23 (×4): qty 1

## 2012-07-23 MED ORDER — ENOXAPARIN SODIUM 40 MG/0.4ML ~~LOC~~ SOLN
40.0000 mg | SUBCUTANEOUS | Status: DC
  Administered 2012-07-23 – 2012-07-24 (×2): 40 mg via SUBCUTANEOUS
  Filled 2012-07-23 (×2): qty 0.4

## 2012-07-23 MED ORDER — TICAGRELOR 90 MG PO TABS
90.0000 mg | ORAL_TABLET | Freq: Two times a day (BID) | ORAL | Status: DC
  Administered 2012-07-23 – 2012-07-26 (×6): 90 mg via ORAL
  Filled 2012-07-23 (×9): qty 1

## 2012-07-23 MED ORDER — DILTIAZEM HCL ER COATED BEADS 120 MG PO CP24
120.0000 mg | ORAL_CAPSULE | Freq: Every day | ORAL | Status: DC
Start: 2012-07-23 — End: 2012-07-26
  Administered 2012-07-23 – 2012-07-26 (×4): 120 mg via ORAL
  Filled 2012-07-23 (×4): qty 1

## 2012-07-23 MED ORDER — ASPIRIN 81 MG PO CHEW
CHEWABLE_TABLET | ORAL | Status: AC
  Filled 2012-07-23: qty 4

## 2012-07-23 MED ORDER — ASPIRIN 81 MG PO CHEW
324.0000 mg | CHEWABLE_TABLET | ORAL | Status: AC
  Administered 2012-07-23: 324 mg via ORAL

## 2012-07-23 MED ORDER — ASPIRIN 300 MG RE SUPP: 300.0000 mg | RECTAL | Status: AC

## 2012-07-23 MED ORDER — FLAXSEED (LINSEED) 1000 MG PO CAPS: 1000.0000 mg | ORAL_CAPSULE | Freq: Every day | ORAL | Status: DC

## 2012-07-23 MED ORDER — SODIUM CHLORIDE 0.9 % IJ SOLN: 3.0000 mL | INTRAMUSCULAR | Status: DC | PRN

## 2012-07-23 MED ORDER — SODIUM CHLORIDE 0.9 % IV SOLN: 250.0000 mL | INTRAVENOUS | Status: DC | PRN

## 2012-07-23 MED ORDER — PANTOPRAZOLE SODIUM 40 MG PO TBEC
40.0000 mg | DELAYED_RELEASE_TABLET | Freq: Every day | ORAL | Status: DC
  Administered 2012-07-24 – 2012-07-26 (×3): 40 mg via ORAL
  Filled 2012-07-23 (×3): qty 1

## 2012-07-23 MED ORDER — MAGNESIUM 250 MG PO TABS: 250.0000 mg | ORAL_TABLET | Freq: Every day | ORAL | Status: DC

## 2012-07-23 NOTE — H&P (Signed)
Physician History and Physical  Patient ID: Michael Guzman MRN: [id number] DOB/AGE: [birth year] 65 y.o. Admit date: 07/23/2012  Primary Care Physician: No primary provider on file. Primary Cardiologist:  Skains/Smith  Active Problems:  * No active hospital problems. *    HPI:   66 yo with CAD 10/12 had two vessel PCI with DES to LAD and OM.  Comes to ER today with recurrent chest pressure.  Did not take nitro at home but some relief in ER Pain not as bad as previous angina.  Central chest no radiation no dyspnea.  No other reason for pain.  No GI overtones no dysphagia diaphoresis pleurisy or cough.  CRF;s of  Diabetes HTN  Compliant with meds. On brillinta for antiplatlet.  Still with some pressure in ER  Initial POC negative and no acute ischemic changes  Review of systems complete and found to be negative unless listed above   Past Medical History  Diagnosis Date  . Diabetes mellitus without complication   . Hypertension   . GERD (gastroesophageal reflux disease)   . Chronic back pain   . CAD (coronary artery disease)     Family History  Problem Relation Age of Onset  . Diabetes Sister     History   Social History  . Marital Status: Married    Spouse Name: N/A    Number of Children: 4  . Years of Education: 2 y colleg   Occupational History  . 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 Smoking status: Former Smoker -- 50 years    Quit date: 07/27/2011  . Smokeless tobacco: Never Used  . Alcohol Use: No  . Drug Use: No  . Sexually Active: Not on file   Other Topics Concern  . Not on file   Social History Narrative   Works as     Past Surgical History  Procedure Date  . Appendectomy   . Cardiac surgery     3 stent placed.      (Not in a hospital admission)  Physical Exam: Blood pressure 108/75, pulse 59, temperature 99 F (37.2 C), temperature source Oral, resp. rate 18, SpO2 98.00%.    Affect appropriate Overweight black  male HEENT: normal Neck supple with no adenopathy JVP normal no bruits no thyromegaly Lungs clear with no wheezing and good diaphragmatic motion Heart:  S1/S2 no murmur, no rub, gallop or click PMI normal Abdomen: benighn, BS positve, no tenderness, no AAA no bruit.  No HSM or HJR Distal pulses intact with no bruits No edema Neuro non-focal Skin warm and dry No muscular weakness   Labs:   Lab Results  Component Value Date   WBC 7.1 07/23/2012   HGB 14.5 07/23/2012   HCT 41.5 07/23/2012   MCV 85.6 07/23/2012   PLT 208 07/23/2012    Lab 07/23/12 0811  NA 135  K 4.4  CL 100  CO2 22  BUN 18  CREATININE 1.02  CALCIUM 9.7  PROT --  BILITOT --  ALKPHOS --  ALT --  AST --  GLUCOSE 130*   Lab Results  Component Value Date   CKTOTAL 191 02/09/2011   CKMB 1.6 02/09/2011   TROPONINI <0.30 06/26/2012    Lab Results  Component Value Date   CHOL 195 06/25/2012   Lab Results  Component Value Date   HDL 47 06/25/2012   Lab Results  Component Value Date   LDLCALC 126* 06/25/2012   Lab Results  Component  Value Date   TRIG 108 06/25/2012   Lab Results  Component Value Date   CHOLHDL 4.1 06/25/2012   No results found for this basename: LDLDIRECT      Radiology: Dg Chest 2 View  07/23/2012  *RADIOLOGY REPORT*  Clinical Data: Left-sided chest tightness and pressure began yesterday.  History of cardiac stenting 4 weeks ago.  History of diabetes, smoking.  CHEST - 2 VIEW  Comparison: 06/25/2012  Findings: Cardiac size is normal.  Coronary stents are visible.  No focal consolidations or pleural effusions are identified.  There is right upper lobe scarring.  Nodule versus prominent nipple shadow at the right lung base.  IMPRESSION:  1.  No evidence for acute cardiopulmonary disease. 2.  Nodule versus nipple shadow at the right lung base.  PA chest with nipple markers is recommended.   Original Report Authenticated By: Norva Pavlov, M.D.    US Abdomen Complete  07/07/2012   *RADIOLOGY REPORT*  Clinical Data:  Elevated liver function tests.  COMPLETE ABDOMINAL ULTRASOUND  Comparison:  MRI abdomen 02/21/2011 and CT abdomen and pelvis 02/09/2011.  Findings:  Gallbladder:  No gallstones, gallbladder wall thickening, or pericholecystic fluid.  Common bile duct:  Measures 0.6 cm.  Liver:  Demonstrates diffusely increased echogenicity consistent with fatty infiltration.  No focal liver lesion or intrahepatic biliary ductal dilatation.  IVC:  Appears normal.  Pancreas:  No focal abnormality seen.  Spleen:  Measures 5.8 cm and appears normal.  Right Kidney:  Measures 10.1 cm.  There is no stone or hydronephrosis.  2.7 cm cyst is identified in the upper pole correlating with findings on the prior studies.  Left Kidney:  Measures 12.8 cm and appears normal.  Abdominal aorta:  No aneurysm identified.  IMPRESSION: No acute finding.  Diffuse fatty infiltration of the liver.   Original Report Authenticated By: Bernadene Bell. Maricela Curet, M.D.    Dg Chest Port 1 View  06/25/2012  *RADIOLOGY REPORT*  Clinical Data: Chest pain and shortness of breath  PORTABLE CHEST - 1 VIEW  Comparison: 06/24/2011  Findings: The cardiomediastinal silhouette is within normal limits. Lungs are clear.  No pneumothorax.  IMPRESSION: Negative.   Original Report Authenticated By: Donavan Burnet, M.D.     EKG:  SR rate 58 early repolarization  ASSESSMENT AND PLAN:   Chest Pain:  Soon post placement of two DES stents.  Pain similar to angina but not as severe Some relief with nitro.  No other reason for "pressure"  Advised patient that repeat cath Monday would be most appropriate stategy for making sure stents patent.  Continue asa and Brillinta Heparin if more pain or enzymes positive DM:  Continue home meds and SS insulin   HTN;  Well controlled daily vital signs  Signed: Theron Arista Nishan11/05/2012, 10:26 AM

## 2012-07-23 NOTE — ED Provider Notes (Signed)
History     CSN: [id number]  Arrival date & time 07/23/12  4098   First MD Initiated Contact with Patient 07/23/12 [phone]      Chief Complaint  Patient presents with  . Chest Pain    (Consider location/radiation/quality/duration/timing/severity/associated sxs/prior treatment) HPI Comments: Patient with history of coronary artery disease with 3 stents placed 4 weeks ago presents with chest tightness. Patient states that the tightness is been worsening for the past 2 days. The tightness is located in the left upper lateral chest. Patient thinks that this is related to gas. Tightness does not radiate. It is not associated with shortness of breath or palpitations. Patient states that he has shortness of breath with activity as he did prior to his recent catheterization. Tightness is mild and improved this morning. Patient is currently taking Brilinta and states that he has been compliant with this. Onset of symptoms acute. Course is constant. Nothing makes symptoms better or worse. Denies fever, chills, cough, SOB, abdominal pain, urinary symptoms, lower extremity edema. Patient states his sugars have been doing very well.   The history is provided by the patient.    Past Medical History  Diagnosis Date  . Diabetes mellitus without complication   . Hypertension   . GERD (gastroesophageal reflux disease)   . Chronic back pain   . CAD (coronary artery disease)     Past Surgical History  Procedure Date  . Appendectomy   . Cardiac surgery     3 stent placed.    Family History  Problem Relation Age of Onset  . Diabetes Sister     History  Substance Use Topics  . Smoking status: Former Smoker -- 50 years    Quit date: 07/27/2011  . Smokeless tobacco: Never Used  . Alcohol Use: No      Review of Systems  Constitutional: Negative for fever and diaphoresis.  HENT: Negative for neck pain.   Eyes: Negative for redness.  Respiratory: Positive for shortness of breath (with  exertion). Negative for cough.   Cardiovascular: Positive for chest pain ('tightness'). Negative for palpitations and leg swelling.  Gastrointestinal: Negative for nausea, vomiting and abdominal pain.  Genitourinary: Negative for dysuria.  Musculoskeletal: Negative for back pain.  Skin: Negative for rash.  Neurological: Negative for syncope and light-headedness.    Allergies  Review of patient's allergies indicates no known allergies.  Home Medications   Current Outpatient Rx  Name  Route  Sig  Dispense  Refill  . ASPIRIN 81 MG PO TABS   Oral   Take 81 mg by mouth daily.         . ATENOLOL 100 MG PO TABS   Oral   Take 100 mg by mouth daily.         . ATORVASTATIN CALCIUM 40 MG PO TABS   Oral   Take 1 tablet (40 mg total) by mouth daily at 6 PM.   30 tablet   0   . VITAMIN D PO   Oral   Take 1 tablet by mouth daily.         Marland Kitchen DILTIAZEM HCL ER COATED BEADS 120 MG PO CP24   Oral   Take 120 mg by mouth daily.         . ENALAPRIL MALEATE 20 MG PO TABS   Oral   Take 20 mg by mouth daily.         Marland Kitchen FLAXSEED (LINSEED) 1000 MG PO CAPS   Oral   Take  1,000 mg by mouth daily.         . GLYBURIDE 5 MG PO TABS   Oral   Take 5 mg by mouth 2 (two) times daily with a meal.         . MAGNESIUM 250 MG PO TABS   Oral   Take 250 mg by mouth daily.         Marland Kitchen MONTELUKAST SODIUM 10 MG PO TABS   Oral   Take 10 mg by mouth at bedtime.         Marland Kitchen NABUMETONE 750 MG PO TABS   Oral   Take 750 mg by mouth 2 (two) times daily.         Marland Kitchen NITROGLYCERIN 0.4 MG SL SUBL   Sublingual   Place 1 tablet (0.4 mg total) under the tongue every 5 (five) minutes x 3 doses as needed for chest pain.   30 tablet   0   . FISH OIL PO   Oral   Take 1 tablet by mouth daily.         Marland Kitchen PANTOPRAZOLE SODIUM 40 MG PO TBEC   Oral   Take 40 mg by mouth daily.         Marland Kitchen TICAGRELOR 90 MG PO TABS   Oral   Take 1 tablet (90 mg total) by mouth 2 (two) times daily.   60 tablet    0     BP 119/78  Pulse 71  Temp 99 F (37.2 C) (Oral)  Resp 18  SpO2 97%  Physical Exam  Nursing note and vitals reviewed. Constitutional: He appears well-developed and well-nourished.  HENT:  Head: Normocephalic and atraumatic.  Mouth/Throat: Mucous membranes are normal. Mucous membranes are not dry.  Eyes: Conjunctivae normal are normal.  Neck: Trachea normal and normal range of motion. Neck supple. Normal carotid pulses and no JVD present. No muscular tenderness present. Carotid bruit is not present. No tracheal deviation present.  Cardiovascular: Normal rate, regular rhythm, S1 normal, S2 normal, normal heart sounds and intact distal pulses.  Exam reveals no distant heart sounds and no decreased pulses.   No murmur heard. Pulmonary/Chest: Effort normal and breath sounds normal. No respiratory distress. He has no wheezes. He exhibits no tenderness.  Abdominal: Soft. Normal aorta and bowel sounds are normal. There is no tenderness. There is no rebound and no guarding.  Musculoskeletal: He exhibits no edema.  Neurological: He is alert.  Skin: Skin is warm and dry. He is not diaphoretic. No cyanosis. No pallor.  Psychiatric: He has a normal mood and affect.    ED Course  Procedures (including critical care time)  Labs Reviewed  BASIC METABOLIC PANEL - Abnormal; Notable for the following:    Glucose, Bld 130 (*)     GFR calc non Af Amer 75 (*)     GFR calc Af Amer 87 (*)     All other components within normal limits  CBC  POCT I-STAT TROPONIN I   Dg Chest 2 View  07/23/2012  *RADIOLOGY REPORT*  Clinical Data: Left-sided chest tightness and pressure began yesterday.  History of cardiac stenting 4 weeks ago.  History of diabetes, smoking.  CHEST - 2 VIEW  Comparison: 06/25/2012  Findings: Cardiac size is normal.  Coronary stents are visible.  No focal consolidations or pleural effusions are identified.  There is right upper lobe scarring.  Nodule versus prominent nipple shadow  at the right lung base.  IMPRESSION:  1.  No  evidence for acute cardiopulmonary disease. 2.  Nodule versus nipple shadow at the right lung base.  PA chest with nipple markers is recommended.   Original Report Authenticated By: Norva Pavlov, M.D.      1. Accelerating angina   2. CAD S/P percutaneous coronary angioplasty     7:39 AM Patient seen and examined. Work-up initiated. Medications ordered. Previous records reviewed.   Vital signs reviewed and are as follows: Filed Vitals:   07/23/12 0726  BP: 119/78  Pulse: 71  Temp: 99 F (37.2 C)  Resp: 18    Date: 07/23/2012  Rate: 58  Rhythm: sinus bradycardia  QRS Axis: normal  Intervals: normal  ST/T Wave abnormalities: normal  Conduction Disutrbances:none  Narrative Interpretation:   Old EKG Reviewed: unchanged from 06/28/2012  10:29 AM Dr. Eden Emms has seen. Patient to be admitted.     MDM  Admit for CP in setting of recent stent placement.        Renne Crigler, [place] 07/23/12 1030

## 2012-07-23 NOTE — ED Notes (Addendum)
Pt reports chest tightness, weakness, SOB and diaphoresis since yesterday.  Symptoms are intermittent, nothing brings on the pain, nothing relives.  denies chest pain.  Pt reports trouble controlling diabetes over the past several days-gets sweaty when his sugar drops.  Pt non-tender on palpation.  Denies N/V.  Pt has started a new diabetes medications since having stents placed in October 2013.

## 2012-07-24 LAB — TROPONIN I
Troponin I: <0.3 ng/mL (ref <0.30)
Troponin I: <0.3 ng/mL (ref <0.30)

## 2012-07-24 LAB — GLUCOSE, CAPILLARY
Glucose-Capillary: 99 mg/dL (ref 70–99)
Glucose-Capillary: 111 mg/dL — ABNORMAL HIGH (ref 70–99)

## 2012-07-24 MED ORDER — LORATADINE 10 MG PO TABS
10.0000 mg | ORAL_TABLET | Freq: Every day | ORAL | Status: DC
  Administered 2012-07-24 – 2012-07-26 (×3): 10 mg via ORAL
  Filled 2012-07-24 (×3): qty 1

## 2012-07-24 MED ORDER — ASPIRIN EC 81 MG PO TBEC
81.0000 mg | DELAYED_RELEASE_TABLET | Freq: Every day | ORAL | Status: DC
  Administered 2012-07-26: 81 mg via ORAL
  Filled 2012-07-24: qty 1

## 2012-07-24 MED ORDER — CIPROFLOXACIN HCL 500 MG PO TABS
500.0000 mg | ORAL_TABLET | Freq: Two times a day (BID) | ORAL | Status: DC
  Administered 2012-07-24 – 2012-07-26 (×5): 500 mg via ORAL
  Filled 2012-07-24 (×6): qty 1

## 2012-07-24 MED ORDER — SODIUM CHLORIDE 0.9 % IV SOLN: 250.0000 mL | INTRAVENOUS | Status: DC | PRN

## 2012-07-24 MED ORDER — ASPIRIN 81 MG PO CHEW
324.0000 mg | CHEWABLE_TABLET | ORAL | Status: AC
  Administered 2012-07-25: 324 mg via ORAL
  Filled 2012-07-24: qty 4

## 2012-07-24 MED ORDER — SODIUM CHLORIDE 0.9 % IJ SOLN
3.0000 mL | Freq: Two times a day (BID) | INTRAMUSCULAR | Status: DC
  Administered 2012-07-24 – 2012-07-25 (×2): 3 mL via INTRAVENOUS

## 2012-07-24 MED ORDER — SODIUM CHLORIDE 0.9 % IJ SOLN: 3.0000 mL | INTRAMUSCULAR | Status: DC | PRN

## 2012-07-24 NOTE — ED Provider Notes (Signed)
Medical screening examination/treatment/procedure(s) were conducted as a shared visit with non-physician practitioner(s) and myself.  I personally evaluated the patient during the encounter  Sondra Blixt T Janitza Revuelta, MD 07/24/12 0743 

## 2012-07-24 NOTE — Progress Notes (Signed)
Patient ID: Michael Guzman, male   DOB: 03/31/1946, 66 y.o.   MRN: [id number]    Subjective:  Head congested.  Had some more chest pain this am radiating to left arm but did not tell nurse  Objective:  Filed Vitals:   07/23/12 1000 07/23/12 1148 07/23/12 2033 07/24/12 0451  BP: 112/78 120/83 96/68 114/78  Pulse: 63 58 62 58  Temp:  97.4 F (36.3 C) 98 F (36.7 C) 98 F (36.7 C)  TempSrc:  Oral Oral Oral  Resp:  16 18 18   Height:  5\' 10"  (1.778 m)    Weight:  190 lb 4.1 oz (86.3 kg)    SpO2: 100% 100% 99% 98%    Intake/Output from previous day:  Intake/Output Summary (Last 24 hours) at 07/24/12 1026 Last data filed at 07/24/12 0900  Gross per 24 hour  Intake    480 ml  Output      0 ml  Net    480 ml    Physical Exam: Affect appropriate Healthy:  appears stated age HEENT: normal Neck supple with no adenopathy JVP normal no bruits no thyromegaly Lungs clear with no wheezing and good diaphragmatic motion Heart:  S1/S2 no murmur, no rub, gallop or click PMI normal Abdomen: benighn, BS positve, no tenderness, no AAA no bruit.  No HSM or HJR Distal pulses intact with no bruits No edema Neuro non-focal Skin warm and dry No muscular weakness   Lab Results: Basic Metabolic Panel:  Basename 07/23/12 1105 07/23/12 0811  NA -- 135  K -- 4.4  CL -- 100  CO2 -- 22  GLUCOSE -- 130*  BUN -- 18  CREATININE 1.04 1.02  CALCIUM -- 9.7  MG -- --  PHOS -- --   Liver Function Tests: No results found for this basename: AST:2,ALT:2,ALKPHOS:2,BILITOT:2,PROT:2,ALBUMIN:2 in the last 72 hours No results found for this basename: LIPASE:2,AMYLASE:2 in the last 72 hours CBC:  Basename 07/23/12 1105 07/23/12 0811  WBC 8.0 7.1  NEUTROABS -- --  HGB 14.3 14.5  HCT 41.3 41.5  MCV 85.2 85.6  PLT 209 208   Cardiac Enzymes:  Basename 07/24/12 0550 07/23/12 2328 07/23/12 1901  CKTOTAL -- -- --  CKMB -- -- --  CKMBINDEX -- -- --  TROPONINI <0.30 <0.30 <0.30     Imaging: Dg Chest 2 View  07/23/2012  *RADIOLOGY REPORT*  Clinical Data: Left-sided chest tightness and pressure began yesterday.  History of cardiac stenting 4 weeks ago.  History of diabetes, smoking.  CHEST - 2 VIEW  Comparison: 06/25/2012  Findings: Cardiac size is normal.  Coronary stents are visible.  No focal consolidations or pleural effusions are identified.  There is right upper lobe scarring.  Nodule versus prominent nipple shadow at the right lung base.  IMPRESSION:  1.  No evidence for acute cardiopulmonary disease. 2.  Nodule versus nipple shadow at the right lung base.  PA chest with nipple markers is recommended.   Original Report Authenticated By: Norva Pavlov, M.D.     Cardiac Studies:  ECG:    Telemetry:  NSR no VT 07/24/2012   Echo: 06/25/12  Study Conclusions  - Left ventricle: The cavity size was normal. There was mild concentric hypertrophy. Systolic function was normal. The estimated ejection fraction was in the range of 55% to 60%. Wall motion was normal; there were no regional wall motion abnormalities. Doppler parameters are consistent with abnormal left ventricular relaxation (grade 1 diastolic dysfunction). - Pulmonary arteries: PA peak pressure: 32mm  Hg (S). Transthoracic echocardiography. M-mode, complete 2D,    Medications:     . [COMPLETED] aspirin  324 mg Oral NOW   Or  . [COMPLETED] aspirin  300 mg Rectal NOW  . aspirin EC  81 mg Oral Daily  . atenolol  100 mg Oral Daily  . cholecalciferol  400 Units Oral Daily  . diltiazem  120 mg Oral Daily  . enalapril  20 mg Oral Daily  . enoxaparin (LOVENOX) injection  40 mg Subcutaneous Q24H  . glyBURIDE  5 mg Oral BID WC  . linagliptin  5 mg Oral BID WC  . magnesium oxide  200 mg Oral Daily  . metFORMIN  1,000 mg Oral BID WC  . montelukast  10 mg Oral QHS  . omega-3 acid ethyl esters  1 g Oral Daily  . pantoprazole  40 mg Oral Daily  . sodium chloride  3 mL Intravenous Q12H  . Ticagrelor   90 mg Oral BID  . [DISCONTINUED] aspirin  81 mg Oral Daily  . [DISCONTINUED] Flaxseed (Linseed)  1,000 mg Oral Daily  . [DISCONTINUED] Magnesium  250 mg Oral Daily  . [DISCONTINUED] sitaGLIPtan-metformin  1 tablet Oral BID WC       Assessment/Plan:  Chest pain recent stent continue ticagrelor and asa  Cath in am with [facility] Sinus Congestio:  Claritin was on floxin as outpatient per [place] will right for HTN:  Continue ACE Diabetes:  Hold metformin for cath  Charlton Haws 07/24/2012, 10:26 AM

## 2012-07-25 ENCOUNTER — Encounter (HOSPITAL_COMMUNITY)
Admission: EM | Disposition: A | Discharge status: Home / Self Care | Payer: Self-pay | Source: Home / Self Care | Attending: Emergency Medicine

## 2012-07-25 HISTORY — PX: LEFT HEART CATHETERIZATION WITH CORONARY ANGIOGRAM: SHX5451

## 2012-07-25 LAB — PROTIME-INR
INR: 0.99 (ref 0.00–1.49)
Prothrombin Time: 13 seconds (ref 11.6–15.2)

## 2012-07-25 LAB — GLUCOSE, CAPILLARY: Glucose-Capillary: 147 mg/dL — ABNORMAL HIGH (ref 70–99)

## 2012-07-25 LAB — TROPONIN I: Troponin I: <0.3 ng/mL (ref <0.30)

## 2012-07-25 SURGERY — LEFT HEART CATHETERIZATION WITH CORONARY ANGIOGRAM
Anesthesia: LOCAL

## 2012-07-25 MED ORDER — NITROGLYCERIN 0.2 MG/ML ON CALL CATH LAB
INTRAVENOUS | Status: AC
  Filled 2012-07-25: qty 1

## 2012-07-25 MED ORDER — FENTANYL CITRATE 0.05 MG/ML IJ SOLN
INTRAMUSCULAR | Status: AC
  Filled 2012-07-25: qty 2

## 2012-07-25 MED ORDER — LIDOCAINE HCL (PF) 1 % IJ SOLN
INTRAMUSCULAR | Status: AC
  Filled 2012-07-25: qty 30

## 2012-07-25 MED ORDER — OXYMETAZOLINE HCL 0.05 % NA SOLN
2.0000 | Freq: Three times a day (TID) | NASAL | Status: DC
  Administered 2012-07-25 – 2012-07-26 (×3): 2 via NASAL
  Filled 2012-07-25: qty 15

## 2012-07-25 MED ORDER — ACETAMINOPHEN 325 MG PO TABS: 650.0000 mg | ORAL_TABLET | ORAL | Status: DC | PRN

## 2012-07-25 MED ORDER — SODIUM CHLORIDE 0.9 % IV SOLN
1.0000 mL/kg/h | INTRAVENOUS | Status: AC
  Administered 2012-07-25: 1 mL/kg/h via INTRAVENOUS

## 2012-07-25 MED ORDER — MUPIROCIN 2 % EX OINT
TOPICAL_OINTMENT | Freq: Three times a day (TID) | CUTANEOUS | Status: DC
  Administered 2012-07-25 – 2012-07-26 (×3): via NASAL
  Filled 2012-07-25: qty 22

## 2012-07-25 MED ORDER — ONDANSETRON HCL 4 MG/2ML IJ SOLN: 4.0000 mg | Freq: Four times a day (QID) | INTRAMUSCULAR | Status: DC | PRN

## 2012-07-25 MED ORDER — MIDAZOLAM HCL 2 MG/2ML IJ SOLN
INTRAMUSCULAR | Status: AC
  Filled 2012-07-25: qty 2

## 2012-07-25 NOTE — Progress Notes (Signed)
SUBJECTIVE:  Had a bout of epistaxis last pm which has resolved  OBJECTIVE:   Vitals:   Filed Vitals:   07/24/12 1400 07/24/12 1921 07/25/12 0450 07/25/12 0808  BP: 103/63 123/83 121/80   Pulse: 58 64 54 15  Temp: 97.9 F (36.6 C) 97.4 F (36.3 C) 97.3 F (36.3 C)   TempSrc: Oral Oral Oral   Resp: 16 18 18    Height:      Weight:   87.091 kg (192 lb)   SpO2: 97% 98% 99%    I&O's:   Intake/Output Summary (Last 24 hours) at 07/25/12 9563 Last data filed at 07/24/12 1700  Gross per 24 hour  Intake    480 ml  Output      2 ml  Net    478 ml   TELEMETRY: Reviewed telemetry pt in NSR:     PHYSICAL EXAM General: Well developed, well nourished, in no acute distress Head: Eyes PERRLA, No xanthomas.   Normal cephalic and atramatic  Lungs:   Clear bilaterally to auscultation and percussion. Heart:   HRRR S1 S2 Pulses are 2+ & equal. Abdomen: Bowel sounds are positive, abdomen soft and non-tender without masses  Extremities:   No clubbing, cyanosis or edema.  DP +1 Neuro: Alert and oriented X 3. Psych:  Good affect, responds appropriately   LABS: Basic Metabolic Panel:  Basename 07/23/12 1105 07/23/12 0811  NA -- 135  K -- 4.4  CL -- 100  CO2 -- 22  GLUCOSE -- 130*  BUN -- 18  CREATININE 1.04 1.02  CALCIUM -- 9.7  MG -- --  PHOS -- --   Liver Function Tests: No results found for this basename: AST:2,ALT:2,ALKPHOS:2,BILITOT:2,PROT:2,ALBUMIN:2 in the last 72 hours No results found for this basename: LIPASE:2,AMYLASE:2 in the last 72 hours CBC:  Basename 07/23/12 1105 07/23/12 0811  WBC 8.0 7.1  NEUTROABS -- --  HGB 14.3 14.5  HCT 41.3 41.5  MCV 85.2 85.6  PLT 209 208   Cardiac Enzymes:  Basename 07/25/12 0510 07/24/12 2351 07/24/12 1750  CKTOTAL -- -- --  CKMB -- -- --  CKMBINDEX -- -- --  TROPONINI <0.30 <0.30 <0.30   Coag Panel:   Lab Results  Component Value Date   INR 0.99 07/24/2012   INR 1.11 06/25/2012    RADIOLOGY: Dg Chest 2  View  07/23/2012  *RADIOLOGY REPORT*  Clinical Data: Left-sided chest tightness and pressure began yesterday.  History of cardiac stenting 4 weeks ago.  History of diabetes, smoking.  CHEST - 2 VIEW  Comparison: 06/25/2012  Findings: Cardiac size is normal.  Coronary stents are visible.  No focal consolidations or pleural effusions are identified.  There is right upper lobe scarring.  Nodule versus prominent nipple shadow at the right lung base.  IMPRESSION:  1.  No evidence for acute cardiopulmonary disease. 2.  Nodule versus nipple shadow at the right lung base.  PA chest with nipple markers is recommended.   Original Report Authenticated By: Norva Pavlov, M.D.    US Abdomen Complete  07/07/2012  *RADIOLOGY REPORT*  Clinical Data:  Elevated liver function tests.  COMPLETE ABDOMINAL ULTRASOUND  Comparison:  MRI abdomen 02/21/2011 and CT abdomen and pelvis 02/09/2011.  Findings:  Gallbladder:  No gallstones, gallbladder wall thickening, or pericholecystic fluid.  Common bile duct:  Measures 0.6 cm.  Liver:  Demonstrates diffusely increased echogenicity consistent with fatty infiltration.  No focal liver lesion or intrahepatic biliary ductal dilatation.  IVC:  Appears normal.  Pancreas:  No focal abnormality seen.  Spleen:  Measures 5.8 cm and appears normal.  Right Kidney:  Measures 10.1 cm.  There is no stone or hydronephrosis.  2.7 cm cyst is identified in the upper pole correlating with findings on the prior studies.  Left Kidney:  Measures 12.8 cm and appears normal.  Abdominal aorta:  No aneurysm identified.  IMPRESSION: No acute finding.  Diffuse fatty infiltration of the liver.   Original Report Authenticated By: Bernadene Bell. Maricela Curet, M.D.     Assessment/Plan:  Chest pain recent stent - ruled out for MI -  continue ticagrelor and asa Cath today Sinus Congestion: Claritin was on floxin as outpatient per [place] will right for  HTN: Continue ACE  Diabetes: Hold metformin for cath  Epistaxis:   Will consult ENT      Quintella Reichert, MD  07/25/2012  9:28 AM

## 2012-07-25 NOTE — Progress Notes (Signed)
RN called to room, pt c/o of nose bleed from both nares. Pressure and ice applied, however pt cont to have bleeding from the right nare. MD made aware. Rhino Rocket placed to the R. nare as per MD order. Pt tolerated well. Will cont to monitor. Benay Pike'

## 2012-07-25 NOTE — Progress Notes (Signed)
Patient received from cath lab. Assessed per flowsheet. Denies chest pain or shortness of breath. Right groin dry/intact with not bleeding or hematoma noted. Post activity and precautions explained. VSS. Call bell and family near.Michael Guzman

## 2012-07-25 NOTE — Interval H&P Note (Signed)
History and Physical Interval Note:  07/25/2012 9:34 AM  Michael Guzman  has presented today for surgery, with the diagnosis of cp  The various methods of treatment have been discussed with the patient and family. After consideration of risks, benefits and other options for treatment, the patient has consented to  Procedure(s) (LRB) with comments: LEFT HEART CATHETERIZATION WITH CORONARY ANGIOGRAM (N/A) as a surgical intervention .  The patient's history has been reviewed, patient examined, no change in status, stable for surgery.  I have reviewed the patient's chart and labs.  Questions were answered to the patient's satisfaction.     TURNER,TRACI R

## 2012-07-25 NOTE — Consult Note (Signed)
      Michael Guzman, Michael Guzman [id number] 1945-11-18 [name], MD  Reason for Consult: Epistaxis  HPI: patient had cardiac catheterization, had right epistaxis last night which resolved with pressure and packing, which the patient removed himself today. He denies recurrent bleeding since then.  Allergies: No Known Allergies  ROS: epistaxis, chest pain. Review of systems otherwise normal x 10 systems except per HPI.  PMH:  Past Medical History  Diagnosis Date  . Diabetes mellitus without complication   . Hypertension   . GERD (gastroesophageal reflux disease)   . Chronic back pain   . CAD (coronary artery disease)     FH:  Family History  Problem Relation Age of Onset  . Diabetes Sister     SH:  History   Social History  . Marital Status: Married    Spouse Name: N/A    Number of Children: 4  . Years of Education: 2 y colleg   Occupational History  . 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 Smoking status: Former Smoker -- 50 years    Quit date: 07/27/2011  . Smokeless tobacco: Never Used  . Alcohol Use: No  . Drug Use: No  . Sexually Active: Not on file   Other Topics Concern  . Not on file   Social History Narrative   Works as     PSH:  Past Surgical History  Procedure Date  . Appendectomy   . Cardiac surgery     3 stent placed.    Physical  Exam: CN 2-12 grossly intact and symmetric. EAC/TMs normal BL. Oral cavity, lips, gums, ororpharynx normal with no masses or lesions. Skin warm and dry. Nasal cavity without polyps or purulence. Anterior rhinoscopy reveals no clots or crusting. Septum is midline and grossly normal. External nose and ears without masses or lesions. EOMI, PERRLA. Neck supple with no masses or lesions. No lymphadenopathy palpated. Thyroid normal with no masses.   A/P: resolved epistaxis. I discussed the options including observation vs. Operative endoscopy and cautery. Patient states that the bleeding has  stopped so he would like to observe this. If this recurs can hold pressure x 10 minutes. I will put him on scheduled Afrin spray x 5 days and TID mupirocin ointment to prevent nasal dryness. He states that this was his first episode of epistaxis in many years so he would like to hold off on any surgical intervention for this at this time.   Melvenia Beam 07/25/2012 3:41 PM

## 2012-07-25 NOTE — CV Procedure (Signed)
PROCEDURE:  Left heart catheterization with selective coronary angiography, left ventriculogram.  INDICATIONS:  CAD with recurrent chest pain s/p PCI  The risks, benefits, and details of the procedure were explained to the patient.  The patient verbalized understanding and wanted to proceed.  Informed written consent was obtained.  PROCEDURE TECHNIQUE:  After Xylocaine anesthesia a 43F sheath was placed in the right femoral artery with a single anterior needle wall stick.   Left coronary angiography was done using a Judkins L4 guide catheter.  Right coronary angiography was done using a Judkins R4 guide catheter.  Left ventriculography was done using a pigtail catheter.    CONTRAST:  Total of 80 cc.  COMPLICATIONS:  None.    HEMODYNAMICS:  Aortic pressure was 126/69mmHg; LV pressure was 109/; LVEDP 2.  There was no gradient between the left ventricle and aorta.    ANGIOGRAPHIC DATA:   The left main coronary artery is widely patent.  It bifurcates into an LAD and left circumflex arteries.    The left anterior descending artery is widely patent.  It gives rise to a first diagonal which is patent and bifurcates into 2 daughter vessels.  The inferior branch is large and widely patent.  The superior branch is small and bifurcates into 2 smaller vessels which are patent.  After the takeoff of the first diagonal there is a long stent in the mid LAD which is patent.  The ongoing LAD is patent.  The left circumflex artery is widely patent.  It gives rise to an OM1 which is widely patent.  The ongoing left circumflex is patent and gives rise to an OM2 which has a long stent in it that is widely patent.   The right coronary artery is widely patent throughout its course.  Distally it bifurcates into a PDA and PL branches.  The PDA has a proximal 50% stenosis.  LEFT VENTRICULOGRAM:  Left ventricular angiogram was done in the 30 RAO projection and revealed normal left ventricular wall motion and systolic  function with an estimated ejection fraction of 60%.  LVEDP was 2 mmHg.  IMPRESSIONS:  1. Normal left main coronary artery. 2. Normal left anterior descending artery and its branches with patent mid LAD stent. 3. Normal left circumflex artery and its branches with patent OM2 stent. 4. Normal right coronary artery with 50% proximal PDA stenosis. 5. Normal left ventricular systolic function.  LVEDP 2 mmHg.  Ejection fraction 60%.  RECOMMENDATION:   1.  Discussed results with Dr. Anne Fu.  Patient states that he feels like he is driving on a straight road and then hits a bump and gets a hollow sensation in his chest followed by chest tightness that goes up into his head with dizziness.  Cath showed patent stents with 50% PDA lesion.  ? Arrhythmia causing symptoms.  Will get an outpatient [employer] monitor. 2. Due to epistaxis last PM and need for Brilinta, will get ENT consult today and monitor overnight.  Plan d/c home in am.

## 2012-07-26 DIAGNOSIS — I200 Unstable angina: Secondary | ICD-10-CM

## 2012-07-26 HISTORY — DX: Unstable angina: I20.0

## 2012-07-26 MED ORDER — OXYMETAZOLINE HCL 0.05 % NA SOLN: 2.0000 | Freq: Three times a day (TID) | NASAL | Status: AC

## 2012-07-26 MED ORDER — OXYMETAZOLINE HCL 0.05 % NA SOLN: 2.0000 | Freq: Three times a day (TID) | NASAL | Status: DC

## 2012-07-26 NOTE — Progress Notes (Signed)
Pt discharged per MD order and protocol. All discharge instructions reviewed with patient and all questions answered. All Rx'es given. Pt aware of follow up appointments.

## 2012-07-26 NOTE — Discharge Summary (Addendum)
Patient ID: BACH DEEMS MRN: [id number] DOB/AGE: 10-01-1945 66 y.o.  Admit date: 07/23/2012 Discharge date: 07/26/2012  Primary Discharge Diagnosis: Chest pain Secondary Discharge Diagnosis: Coronary artery disease-recent stent placement to circumflex and LAD October 2013-repeat cardiac catheterization during this hospitalization shows patent stents. Diabetes, hypertension, hyperlipidemia, fatty liver disease, recent elevated liver function-holding statin, PVCs, GERD, obesity  Significant Diagnostic Studies: Cardiac catheterization 08/19/12-patent stents, normal EF, and certainly views, distal vasculature post stent appears small." Possibility of vasospasm.  Consults: Ear nose and throat, Dr. Emeline Darling, recent nosebleed with dual antiplatelet therapy. Recommended Afrin nasal spray. Packing. Instructions given. No surgical therapy at this point.  Hospital Course: 66 year old man with coronary artery disease status post recent stent placement in both the circumflex as well as LAD 06/27/12 who was admitted with symptoms of upper abdominal/epigastric discomfort that radiated under his left breast to his left axilla that was different from his prior chest pain but worrisome to him. He also had a nondescript feeling of occasional "bumps in the road "which could certainly correlate with PVCs that were picked up on telemetry. He feels as though after cardiac catheterization showed reassuring stents that this may be GERD related. He was wondering if he was on Protonix as he was on previously. He states that recently his Lipitor has been discontinued because of elevated liver enzymes, likely fatty liver disease.  Primary is following.  Cardiac catheterization was reassuring as above. He did have quite small distal vasculature however which makes me think of possible vasospasm. At this time however he is feeling well and I will not initiate any further new medication such as isosorbide. His symptoms could  certainly be GERD related. Continue with proton pump inhibitor.  In regards to his diabetes, continue with her medications. Resuming metformin.  Weight loss is important for him.  Telemetry demonstrates occasional PVCs, occasionally multifocal. The way he describes his symptoms of "bumps in the road "during his sedentary activity in both the possibility of PVCs. He has not had any frank syncope. If the symptoms worsen or become more worrisome, we will likely place event monitor. Telemetry shows no adverse arrhythmias. EKG is normal. No ischemic changes.  Discharge Exam: Blood pressure 101/66, pulse 65, temperature 98.5 F (36.9 C), temperature source Oral, resp. rate 18, height 5\' 10"  (1.778 m), weight 87.091 kg (192 lb), SpO2 100.00%.    Cath site clean dry and intact, abdomen is soft nontender, normal epigastric palpation, lungs are clear to auscultation, heart is regular rate and rhythm, no murmurs, alert and oriented x3.  Labs:   Lab Results  Component Value Date   WBC 8.0 07/23/2012   HGB 14.3 07/23/2012   HCT 41.3 07/23/2012   MCV 85.2 07/23/2012   PLT 209 07/23/2012    Lab 07/23/12 1105 07/23/12 0811  NA -- 135  K -- 4.4  CL -- 100  CO2 -- 22  BUN -- 18  CREATININE 1.04 --  CALCIUM -- 9.7  PROT -- --  BILITOT -- --  ALKPHOS -- --  ALT -- --  AST -- --  GLUCOSE -- 130*   Lab Results  Component Value Date   CKTOTAL 191 02/09/2011   CKMB 1.6 02/09/2011   TROPONINI <0.30 08-19-12    Lab Results  Component Value Date   CHOL 195 06/25/2012   Lab Results  Component Value Date   HDL 47 06/25/2012   Lab Results  Component Value Date   LDLCALC 126* 06/25/2012   Lab Results  Component  Value Date   TRIG 108 06/25/2012   Lab Results  Component Value Date   CHOLHDL 4.1 06/25/2012   No results found for this basename: LDLDIRECT     And FOLLOW UP PLANS AND APPOINTMENTS Discharge Orders    Future Orders Please Complete By Expires   Diet - low sodium heart  healthy      Increase activity slowly          Medication List     As of 07/26/2012  9:06 AM    TAKE these medications         aspirin 81 MG tablet   Take 81 mg by mouth daily.      atenolol 100 MG tablet   Commonly known as: TENORMIN   Take 100 mg by mouth daily.      diltiazem 120 MG 24 hr capsule   Commonly known as: CARDIZEM CD   Take 120 mg by mouth daily.      enalapril 20 MG tablet   Commonly known as: VASOTEC   Take 20 mg by mouth daily.      FISH OIL PO   Take 1 tablet by mouth daily.      Flaxseed (Linseed) 1000 MG Caps   Take 1,000 mg by mouth daily.      glyBURIDE 5 MG tablet   Commonly known as: DIABETA   Take 5 mg by mouth 2 (two) times daily with a meal.      Magnesium 250 MG Tabs   Take 250 mg by mouth daily.      montelukast 10 MG tablet   Commonly known as: SINGULAIR   Take 10 mg by mouth at bedtime.      nitroGLYCERIN 0.4 MG SL tablet   Commonly known as: NITROSTAT   Place 1 tablet (0.4 mg total) under the tongue every 5 (five) minutes x 3 doses as needed for chest pain.      oxymetazoline 0.05 % nasal spray   Commonly known as: AFRIN   Place 2 sprays into the nose 3 (three) times daily.      pantoprazole 40 MG tablet   Commonly known as: PROTONIX   Take 40 mg by mouth daily.      sitaGLIPtan-metformin 50-1000 MG per tablet   Commonly known as: JANUMET   Take 1 tablet by mouth 2 (two) times daily with a meal.      Ticagrelor 90 MG Tabs tablet   Commonly known as: BRILINTA   Take 1 tablet (90 mg total) by mouth 2 (two) times daily.      VITAMIN D PO   Take 1 tablet by mouth daily.           Follow-up Information    Follow up with FERGUSON,CYNTHIA A, NP. On 08/01/2012. (9:50am)    Contact information:   [facility] [address] [place], [address] [place] [address] [phone]         Please follow up with primary care physician as well.  BRING ALL MEDICATIONS WITH YOU TO FOLLOW UP  APPOINTMENTS  Time spent with patient to include physician time:35 minutes spent with patient education, meds reconciliation, review of medical records, catheterization. SignedDonato Schultz 07/26/2012, 9:06 AM

## 2012-08-17 ENCOUNTER — Other Ambulatory Visit (HOSPITAL_COMMUNITY): Payer: Self-pay | Admitting: Internal Medicine

## 2012-08-17 DIAGNOSIS — R7989 Other specified abnormal findings of blood chemistry: Secondary | ICD-10-CM

## 2012-08-17 DIAGNOSIS — K469 Unspecified abdominal hernia without obstruction or gangrene: Secondary | ICD-10-CM

## 2012-08-17 DIAGNOSIS — K2960 Other gastritis without bleeding: Secondary | ICD-10-CM

## 2012-09-01 ENCOUNTER — Encounter (HOSPITAL_COMMUNITY)
Admission: RE | Admit: 2012-09-01 | Discharge: 2012-09-01 | Disposition: A | Discharge status: Home / Self Care | Payer: BC Managed Care – PPO | Source: Ambulatory Visit | Attending: Internal Medicine | Admitting: Internal Medicine

## 2012-09-01 DIAGNOSIS — R7989 Other specified abnormal findings of blood chemistry: Secondary | ICD-10-CM

## 2012-09-02 ENCOUNTER — Encounter (HOSPITAL_COMMUNITY)
Admission: RE | Admit: 2012-09-02 | Discharge: 2012-09-02 | Disposition: A | Discharge status: Home / Self Care | Payer: BC Managed Care – PPO | Source: Ambulatory Visit | Attending: Internal Medicine | Admitting: Internal Medicine

## 2012-09-02 ENCOUNTER — Encounter (HOSPITAL_COMMUNITY): Payer: Self-pay

## 2012-09-02 DIAGNOSIS — E0590 Thyrotoxicosis, unspecified without thyrotoxic crisis or storm: Secondary | ICD-10-CM | POA: Insufficient documentation

## 2012-09-02 MED ORDER — SODIUM PERTECHNETATE TC 99M INJECTION
10.5000 | Freq: Once | INTRAVENOUS | Status: AC | PRN
  Administered 2012-09-02: 10.5 via INTRAVENOUS

## 2012-09-02 MED ORDER — SODIUM IODIDE I 131 CAPSULE
7.0000 | Freq: Once | INTRAVENOUS | Status: AC | PRN
  Administered 2012-09-01: 7 via ORAL

## 2012-09-16 ENCOUNTER — Ambulatory Visit (HOSPITAL_COMMUNITY)
Admission: RE | Admit: 2012-09-16 | Discharge: 2012-09-16 | Disposition: A | Discharge status: Home / Self Care | Payer: BC Managed Care – PPO | Source: Ambulatory Visit | Attending: Internal Medicine | Admitting: Internal Medicine

## 2012-09-16 ENCOUNTER — Other Ambulatory Visit (HOSPITAL_COMMUNITY): Payer: Self-pay | Admitting: Internal Medicine

## 2012-09-16 ENCOUNTER — Ambulatory Visit (HOSPITAL_COMMUNITY): Admission: RE | Admit: 2012-09-16 | Payer: BC Managed Care – PPO | Source: Ambulatory Visit

## 2012-09-16 DIAGNOSIS — K219 Gastro-esophageal reflux disease without esophagitis: Secondary | ICD-10-CM | POA: Insufficient documentation

## 2012-09-16 DIAGNOSIS — K2960 Other gastritis without bleeding: Secondary | ICD-10-CM

## 2012-09-16 DIAGNOSIS — K469 Unspecified abdominal hernia without obstruction or gangrene: Secondary | ICD-10-CM

## 2012-09-16 DIAGNOSIS — IMO0001 Reserved for inherently not codable concepts without codable children: Secondary | ICD-10-CM

## 2013-01-20 ENCOUNTER — Other Ambulatory Visit (HOSPITAL_COMMUNITY): Payer: Self-pay | Admitting: Internal Medicine

## 2013-01-20 ENCOUNTER — Ambulatory Visit (HOSPITAL_COMMUNITY)
Admission: RE | Admit: 2013-01-20 | Discharge: 2013-01-20 | Disposition: A | Discharge status: Home / Self Care | Payer: BC Managed Care – PPO | Source: Ambulatory Visit | Attending: Internal Medicine | Admitting: Internal Medicine

## 2013-01-20 DIAGNOSIS — Z87891 Personal history of nicotine dependence: Secondary | ICD-10-CM | POA: Insufficient documentation

## 2013-01-20 DIAGNOSIS — R05 Cough: Secondary | ICD-10-CM | POA: Insufficient documentation

## 2013-01-20 DIAGNOSIS — R079 Chest pain, unspecified: Secondary | ICD-10-CM | POA: Insufficient documentation

## 2013-01-20 DIAGNOSIS — R059 Cough, unspecified: Secondary | ICD-10-CM | POA: Insufficient documentation

## 2013-01-20 DIAGNOSIS — R072 Precordial pain: Secondary | ICD-10-CM

## 2013-06-12 ENCOUNTER — Encounter (HOSPITAL_COMMUNITY): Payer: Self-pay | Admitting: *Deleted

## 2013-06-12 ENCOUNTER — Emergency Department (HOSPITAL_COMMUNITY): Payer: BC Managed Care – PPO

## 2013-06-12 ENCOUNTER — Emergency Department (HOSPITAL_COMMUNITY)
Admission: EM | Admit: 2013-06-12 | Discharge: 2013-06-12 | Disposition: A | Discharge status: Home / Self Care | Payer: BC Managed Care – PPO | Attending: Emergency Medicine | Admitting: Emergency Medicine

## 2013-06-12 DIAGNOSIS — E119 Type 2 diabetes mellitus without complications: Secondary | ICD-10-CM | POA: Insufficient documentation

## 2013-06-12 DIAGNOSIS — Z7982 Long term (current) use of aspirin: Secondary | ICD-10-CM | POA: Insufficient documentation

## 2013-06-12 DIAGNOSIS — K219 Gastro-esophageal reflux disease without esophagitis: Secondary | ICD-10-CM | POA: Insufficient documentation

## 2013-06-12 DIAGNOSIS — G8929 Other chronic pain: Secondary | ICD-10-CM | POA: Insufficient documentation

## 2013-06-12 DIAGNOSIS — Z87891 Personal history of nicotine dependence: Secondary | ICD-10-CM | POA: Insufficient documentation

## 2013-06-12 DIAGNOSIS — K5900 Constipation, unspecified: Secondary | ICD-10-CM | POA: Insufficient documentation

## 2013-06-12 DIAGNOSIS — R109 Unspecified abdominal pain: Secondary | ICD-10-CM

## 2013-06-12 DIAGNOSIS — I10 Essential (primary) hypertension: Secondary | ICD-10-CM | POA: Insufficient documentation

## 2013-06-12 DIAGNOSIS — M549 Dorsalgia, unspecified: Secondary | ICD-10-CM | POA: Insufficient documentation

## 2013-06-12 DIAGNOSIS — Z79899 Other long term (current) drug therapy: Secondary | ICD-10-CM | POA: Insufficient documentation

## 2013-06-12 DIAGNOSIS — R0789 Other chest pain: Secondary | ICD-10-CM | POA: Insufficient documentation

## 2013-06-12 DIAGNOSIS — I2510 Atherosclerotic heart disease of native coronary artery without angina pectoris: Secondary | ICD-10-CM | POA: Insufficient documentation

## 2013-06-12 DIAGNOSIS — Z9889 Other specified postprocedural states: Secondary | ICD-10-CM | POA: Insufficient documentation

## 2013-06-12 LAB — CBC WITH DIFFERENTIAL/PLATELET
Basophils Absolute: 0.1 10*3/uL (ref 0.0–0.1)
Basophils Relative: 1 % (ref 0–1)
Eosinophils Absolute: 0.2 10*3/uL (ref 0.0–0.7)
Eosinophils Relative: 3 % (ref 0–5)
MCH: 28.9 pg (ref 26.0–34.0)
MCHC: 33.7 g/dL (ref 30.0–36.0)
MCV: 85.8 fL (ref 78.0–100.0)
Platelets: 195 10*3/uL (ref 150–400)
RDW: 12.5 % (ref 11.5–15.5)
WBC: 6.7 10*3/uL (ref 4.0–10.5)

## 2013-06-12 LAB — COMPREHENSIVE METABOLIC PANEL
ALT: 23 U/L (ref 0–53)
AST: 18 U/L (ref 0–37)
Calcium: 9.6 mg/dL (ref 8.4–10.5)
Sodium: 137 mEq/L (ref 135–145)
Total Protein: 7.3 g/dL (ref 6.0–8.3)

## 2013-06-12 LAB — POCT I-STAT TROPONIN I

## 2013-06-12 LAB — D-DIMER, QUANTITATIVE: D-Dimer, Quant: <0.27 ug/mL-FEU (ref 0.00–0.48)

## 2013-06-12 LAB — LIPASE, BLOOD: Lipase: 33 U/L (ref 11–59)

## 2013-06-12 MED ORDER — ASPIRIN 81 MG PO CHEW
324.0000 mg | CHEWABLE_TABLET | Freq: Once | ORAL | Status: AC
  Administered 2013-06-12: 324 mg via ORAL
  Filled 2013-06-12: qty 4

## 2013-06-12 NOTE — ED Provider Notes (Signed)
TIME SEEN: 7:16 AM  CHIEF COMPLAINT: abdominal pain, chest pain, constipation  HPI:   Pt is a 67 y.o. M with a history of hypertension, hyperlipidemia, non-insulin-dependent diabetes, CAD status post drug-eluting stents to the OM2 and LAD on October 2013 who presents emergency department for evaluation for one week of constant left-sided abdominal pain that intermittently radiates into his chest. He reports his abdominal pain feels like a mild to moderate soreness that will radiate into his chest and he was tingling in his left arm. He denies that he's had any increased shortness of breath than his baseline. No nausea, vomiting or diaphoresis. He states that his pain is worse after eating and worse with deep inspiration. Patient denies that this pain is exertional. He states he takes his dog long walks every day and does not notice a change in his constant left-sided abdominal pain. He states exertion does not bring on his chest pain. He denies that this is different than his chest pain when he had a cardiac catheterization. He denies any alleviating factors. Patient reports he is not currently having chest pain, the last episode of chest pain was last night. He is still having abdominal pain. Denies any fever, cough, diarrhea, bloody stools or melena. Patient reports that he has been more constipated recently. Last normal bowel movement was 4 days ago. No history of small bowel obstruction. He is passing gas.  Patient denies a history of prior PE or DVT. No recent lower extremity swelling or pain. No recent flight, hospitalization, surgery, trauma, fracture. No history of cancer.   ROS: See HPI Constitutional: no fever  Eyes: no drainage  ENT: no runny nose   Cardiovascular:  no chest pain  Resp: no SOB  GI: no vomiting GU: no dysuria Integumentary: no rash  Allergy: no hives  Musculoskeletal: no leg swelling  Neurological: no slurred speech ROS otherwise negative  PAST MEDICAL HISTORY/PAST  SURGICAL HISTORY:  Past Medical History  Diagnosis Date  . Diabetes mellitus without complication   . Hypertension   . GERD (gastroesophageal reflux disease)   . Chronic back pain   . CAD (coronary artery disease)     MEDICATIONS:  Prior to Admission medications   Medication Sig Start Date End Date Taking? Authorizing Provider  aspirin 81 MG tablet Take 81 mg by mouth daily.    Historical Provider, MD  atenolol (TENORMIN) 100 MG tablet Take 100 mg by mouth daily.    Historical Provider, MD  Cholecalciferol (VITAMIN D PO) Take 1 tablet by mouth daily.    Historical Provider, MD  diltiazem (CARDIZEM CD) 120 MG 24 hr capsule Take 120 mg by mouth daily.    Historical Provider, MD  enalapril (VASOTEC) 20 MG tablet Take 20 mg by mouth daily.    Historical Provider, MD  Flaxseed, Linseed, 1000 MG CAPS Take 1,000 mg by mouth daily.    Historical Provider, MD  glyBURIDE (DIABETA) 5 MG tablet Take 5 mg by mouth 2 (two) times daily with a meal.    Historical Provider, MD  Magnesium 250 MG TABS Take 250 mg by mouth daily.    Historical Provider, MD  montelukast (SINGULAIR) 10 MG tablet Take 10 mg by mouth at bedtime.    Historical Provider, MD  nitroGLYCERIN (NITROSTAT) 0.4 MG SL tablet Place 1 tablet (0.4 mg total) under the tongue every 5 (five) minutes x 3 doses as needed for chest pain. 06/28/12   Almyra Deforest, MD  Omega-3 Fatty Acids (FISH OIL PO) Take  1 tablet by mouth daily.    Historical Provider, MD  pantoprazole (PROTONIX) 40 MG tablet Take 40 mg by mouth daily.    Historical Provider, MD  sitaGLIPtan-metformin (JANUMET) 50-1000 MG per tablet Take 1 tablet by mouth 2 (two) times daily with a meal.    Historical Provider, MD  Ticagrelor (BRILINTA) 90 MG TABS tablet Take 1 tablet (90 mg total) by mouth 2 (two) times daily. 06/28/12   Almyra Deforest, MD    ALLERGIES:  No Known Allergies  SOCIAL HISTORY:  History  Substance Use Topics  . Smoking status: Former Smoker -- 50 years     Quit date: 07/27/2011  . Smokeless tobacco: Never Used  . Alcohol Use: No    FAMILY HISTORY: Family History  Problem Relation Age of Onset  . Diabetes Sister     EXAM: BP 135/86  Pulse 62  Temp(Src) 98.2 F (36.8 C) (Oral)  Resp 20  SpO2 97% CONSTITUTIONAL: Alert and oriented and responds appropriately to questions. Well-appearing; well-nourished HEAD: Normocephalic EYES: Conjunctivae clear, PERRL ENT: normal nose; no rhinorrhea; moist mucous membranes; pharynx without lesions noted NECK: Supple, no meningismus, no LAD  CARD: RRR; S1 and S2 appreciated; no murmurs, no clicks, no rubs, no gallops, chest wall is nontender to palpation, no skin lesions noted over the left side of his chest wall RESP: Normal chest excursion without splinting or tachypnea; breath sounds clear and equal bilaterally; no wheezes, no rhonchi, no rales,  ABD/GI: Normal bowel sounds; non-distended; soft, non-tender, no rebound, no guarding BACK:  The back appears normal and is non-tender to palpation, there is no CVA tenderness EXT: Normal ROM in all joints; non-tender to palpation; no edema; normal capillary refill; no cyanosis    SKIN: Normal color for age and race; warm NEURO: Moves all extremities equally PSYCH: The patient's mood and manner are appropriate. Grooming and personal hygiene are appropriate.  MEDICAL DECISION MAKING: Patient with left-sided abdominal pain that radiates into his left chest intermittently. His chest pain is very atypical although he does have multiple risk factors for heart disease. Patient denies that this pain feels like his prior chest pain when he recently had stents placed in October 2013. Patient's abdominal exam is also completely benign. Will obtain cardiac labs, abdominal labs, acute abdominal series. Patient's EKG shows no ischemic changes. Discussed with patient that I feel this is unlikely cardiac in nature but he may need admission for ACS rule out given his risk  factors. He reports he would like discharge home with close outpatient followup instead.  : Date: 06/12/2013  AM  Rate: 60  Rhythm: normal sinus rhythm  QRS Axis: normal  Intervals: normal  ST/T Wave abnormalities: normal  Conduction Disutrbances: none  Narrative Interpretation: unremarkable; no ischemic changes      ED PROGRESS: Patient is still hemodynamically stable and has no chest pain. His labs including troponin, d-dimer, abdominal labs Will within normal limits. Abdominal x-ray, chest x-ray normal. Discussed with patient he would get would like discharge home. He agrees to stay in the ED for second set of cardiac labs.   2:31 PM  Repeat troponin is negative. We'll discharge home with close outpatient followup. Given strict return precautions. Patient verbalizes understanding is comfortable with this plan.  Layla Maw Ward, DO 06/12/13 1432

## 2013-06-12 NOTE — ED Notes (Signed)
Dr Elesa Massed notified of pt's desire to leave --

## 2013-06-12 NOTE — ED Notes (Signed)
Pt informed of dr's request for repeat troponin at 1400, states that he was told it would be in 3 hours, not 6. Also states "I haven't had anything to eat, I have been just laying here" . Then admits he was given a [place] sandwich this am around 9am. States he will stay and wait until 2 pm labs drawn

## 2013-06-12 NOTE — ED Notes (Signed)
Pt states last week started having left sided abdominal pain that radiates up into his chest intermittently.  Complains of heart burn and constipation.  Pt states pain has got worse.  Pt reports cardiac stents placed this time last year.  Pt reports some sob

## 2013-06-30 ENCOUNTER — Encounter: Payer: Self-pay | Admitting: Internal Medicine

## 2013-07-19 ENCOUNTER — Encounter: Payer: Self-pay | Admitting: Internal Medicine

## 2013-07-19 ENCOUNTER — Ambulatory Visit: Payer: BC Managed Care – PPO | Admitting: Internal Medicine

## 2013-07-19 VITALS — BP 122/78 | HR 60 | Temp 97.3°F | Resp 18 | Ht 68.0 in | Wt 199.6 lb

## 2013-07-19 DIAGNOSIS — I10 Essential (primary) hypertension: Secondary | ICD-10-CM

## 2013-07-19 DIAGNOSIS — Z0000 Encounter for general adult medical examination without abnormal findings: Secondary | ICD-10-CM

## 2013-07-19 DIAGNOSIS — E559 Vitamin D deficiency, unspecified: Secondary | ICD-10-CM

## 2013-07-19 DIAGNOSIS — Z1212 Encounter for screening for malignant neoplasm of rectum: Secondary | ICD-10-CM

## 2013-07-19 DIAGNOSIS — I2510 Atherosclerotic heart disease of native coronary artery without angina pectoris: Secondary | ICD-10-CM

## 2013-07-19 LAB — LIPID PANEL
Cholesterol: 100 mg/dL (ref 0–200)
HDL: 42 mg/dL (ref >39)
LDL Cholesterol: 21 mg/dL (ref 0–99)
Total CHOL/HDL Ratio: 2.4 Ratio
Triglycerides: 187 mg/dL — ABNORMAL HIGH (ref <150)
VLDL: 37 mg/dL (ref 0–40)

## 2013-07-19 LAB — BASIC METABOLIC PANEL WITH GFR
CO2: 26 mEq/L (ref 19–32)
Calcium: 9.8 mg/dL (ref 8.4–10.5)
Chloride: 101 mEq/L (ref 96–112)
GFR, Est African American: 85 mL/min
GFR, Est Non African American: 74 mL/min
Sodium: 138 mEq/L (ref 135–145)

## 2013-07-19 LAB — TSH: TSH: 0.209 u[IU]/mL — ABNORMAL LOW (ref 0.350–4.500)

## 2013-07-19 LAB — CBC WITH DIFFERENTIAL/PLATELET
Basophils Relative: 1 % (ref 0–1)
Eosinophils Absolute: 0.4 10*3/uL (ref 0.0–0.7)
Eosinophils Relative: 4 % (ref 0–5)
Hemoglobin: 14.2 g/dL (ref 13.0–17.0)
Lymphocytes Relative: 34 % (ref 12–46)
Lymphs Abs: 3 10*3/uL (ref 0.7–4.0)
MCH: 28.9 pg (ref 26.0–34.0)
MCHC: 34.2 g/dL (ref 30.0–36.0)
MCV: 84.5 fL (ref 78.0–100.0)
Monocytes Absolute: 0.8 10*3/uL (ref 0.1–1.0)
Neutrophils Relative %: 52 % (ref 43–77)
Platelets: 222 10*3/uL (ref 150–400)
RBC: 4.91 MIL/uL (ref 4.22–5.81)
WBC: 8.9 10*3/uL (ref 4.0–10.5)

## 2013-07-19 LAB — HEPATIC FUNCTION PANEL
ALT: 27 U/L (ref 0–53)
AST: 18 U/L (ref 0–37)
Albumin: 4.5 g/dL (ref 3.5–5.2)
Alkaline Phosphatase: 72 U/L (ref 39–117)
Bilirubin, Direct: 0.1 mg/dL (ref 0.0–0.3)
Indirect Bilirubin: 0.4 mg/dL (ref 0.0–0.9)
Total Bilirubin: 0.5 mg/dL (ref 0.3–1.2)
Total Protein: 7 g/dL (ref 6.0–8.3)

## 2013-07-19 LAB — MAGNESIUM: Magnesium: 1.9 mg/dL (ref 1.5–2.5)

## 2013-07-19 MED ORDER — METOCLOPRAMIDE HCL 10 MG PO TABS: 10.0000 mg | ORAL_TABLET | Freq: Every day | ORAL | Status: DC

## 2013-07-19 MED ORDER — CHOLECALCIFEROL 125 MCG (5000 UT) PO TABS: 5000.0000 [IU] | ORAL_TABLET | ORAL | Status: DC

## 2013-07-19 MED ORDER — FISH OIL 1000 MG PO CAPS: 1000.0000 mg | ORAL_CAPSULE | Freq: Two times a day (BID) | ORAL | Status: DC

## 2013-07-19 MED ORDER — CANAGLIFLOZIN 300 MG PO TABS: 150.0000 mg | ORAL_TABLET | Freq: Every day | ORAL | Status: DC

## 2013-07-19 NOTE — Progress Notes (Signed)
Patient ID: Michael Guzman, male   DOB: Feb 12, 1946, 67 y.o.   MRN: [id number] Complete Physical HPI Patient presents for complete physical.  Patient's blood pressure has/has not been controlled at home. Patient present w/o  CP and underwent PTCA in Oct 2013 by Dr Anne Fu. Since then doing well w/o chest pain, palpitations shortness of breath, dizziness, or edema. Patient's cholesterol is diet & med controlled. He denies myalgias.  cholesterol last visit was  Lab Results  Component Value Date/Time   CHOL 195 06/25/2012 12:33 PM   . he has Mosetta Pigeon not been working on diet and exercise for diabetes, denies changes in vision, polys, and paresthesias. Last A1C in office was 7.0% in September. He had negative diabetic eye exam in June w/ Dr. Nile Riggs. .  Current outpatient prescriptions:aspirin 81 MG tablet, Take 81 mg by mouth daily., Disp: , Rfl: ;  atorvastatin (LIPITOR) 40 MG tablet, Take 40 mg by mouth daily., Disp: , Rfl: ;  Canagliflozin (INVOKANA) 300 MG TABS, Take 0.5 tablets (150 mg total) by mouth daily., Disp: 30 tablet, Rfl: 99;  Cholecalciferol (GNP VITAMIN D SUPER STRENGTH) 5000 UNITS TABS, Take 1 tablet (5,000 Units total) by mouth 1 day or 1 dose., Disp: 30 tablet, Rfl: 99 enalapril (VASOTEC) 20 MG tablet, Take 20 mg by mouth daily., Disp: , Rfl: ;  glipiZIDE (GLUCOTROL) 5 MG tablet, Take 5 mg by mouth 2 (two) times daily before a meal., Disp: , Rfl: ;  levofloxacin (LEVAQUIN) 500 MG tablet, Take 500 mg by mouth daily. Last dose due on 06/12/13, Disp: , Rfl: ;  Magnesium 250 MG TABS, Take 250 mg by mouth daily., Disp: , Rfl:  metFORMIN (GLUCOPHAGE) 1000 MG tablet, Take 1,000 mg by mouth 2 (two) times daily with a meal., Disp: , Rfl: ;  metoCLOPramide (REGLAN) 10 MG tablet, Take 1 tablet (10 mg total) by mouth at bedtime., Disp: 90 tablet, Rfl: 99;  montelukast (SINGULAIR) 10 MG tablet, Take 10 mg by mouth at bedtime., Disp: , Rfl: ;  nebivolol (BYSTOLIC) 10 MG tablet, Take 10 mg by mouth daily.,  Disp: , Rfl:  nitroGLYCERIN (NITROSTAT) 0.4 MG SL tablet, Place 1 tablet (0.4 mg total) under the tongue every 5 (five) minutes x 3 doses as needed for chest pain., Disp: 30 tablet, Rfl: 0;  Omega-3 Fatty Acids (FISH OIL) 1000 MG CAPS, Take 1 capsule (1,000 mg total) by mouth 2 (two) times daily., Disp: 360 capsule, Rfl: 0;  pantoprazole (PROTONIX) 40 MG tablet, Take 40 mg by mouth daily., Disp: , Rfl: . @IMMTABLE @ Allergies  Allergen Reactions  . Ibuprofen     GI upset  . Naproxen     GI upset   Past Medical History  Diagnosis Date  . Diabetes mellitus without complication   . Hypertension   . GERD (gastroesophageal reflux disease)   . Chronic back pain   . CAD (coronary artery disease)    Past Surgical History  Procedure Laterality Date  . Appendectomy    . Cardiac surgery      3 stent placed.   Family History  Problem Relation Age of Onset  . Diabetes Sister    History   Social History  . Marital Status: Married    Spouse Name: N/A    Number of Children: 4  . Years of Education: 2 y colleg   Occupational History  . 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 Smoking status: Former Smoker -- 50 years  Quit date: 07/27/2011  . Smokeless tobacco: Never Used  . Alcohol Use: No  . Drug Use: No  . Sexual Activity: Not on file   Other Topics Concern  . Not on file   Social History Narrative   Works as    ROS Constitutional: Denies fever, chills, weight loss/gain, headaches, insomnia, fatigue, night sweats, and change in appetite. Eyes: Denies redness, blurred vision, diplopia, discharge, itchy, watery eyes.  ENT: Denies discharge, congestion, post nasal drip, epistaxis, sore throat, earache, hearing loss, dental pain, Tinnitus, Vertigo, Sinus pain, snoring.  Cardio: Denies chest pain, palpitations, irregular heartbeat, syncope, dyspnea, diaphoresis, orthopnea, PND, claudication, edema Respiratory: denies cough, dyspnea, DOE, pleurisy,  hoarseness, laryngitis, wheezing.  Gastrointestinal: Denies dysphagia, heartburn, reflux, water brash, pain, cramps, nausea, vomiting, bloating, diarrhea, constipation, hematemesis, melena, hematochezia, jaundice, hemorrhoids Genitourinary: Denies dysuria, frequency, urgency, nocturia, hesitancy, discharge, hematuria, flank pain Breast:Breast lumps, nipple discharge, bleeding.  Musculoskeletal: Denies myalgia,  pain, limp, and strain/sprain,but has had recent pains and stiffness in left thumb, wrist and forearm to the shoulder. Skin: Denies puritis, rash, hives, warts, acne, eczema, changing in skin lesion Neuro: Weakness, tremor, incoordination, spasms, paresthesia, pain Psychiatric: Denies confusion, memory loss, sensory loss Endocrine: Denies change in weight, skin, hair change, nocturia, and paresthesia, Diabetic Polys, visual blurring, hyper /hypo glycemic episodes.  Heme/Lymph: No excessive bleeding, bruising, enlarged lymph nodes Filed Vitals:   07/19/13 1406  BP: 122/78  Pulse: 60  Temp: 97.3 F (36.3 C)  Resp: 18   Physical Exam: General Appearance: Well nourished, in no apparent distress. Eyes: PERRLA, EOMs, conjunctiva no swelling or erythema, normal fundi and vessels. Sinuses: No Frontal/maxillary tenderness ENT/Mouth: Ext aud canals clear, normal light reflex with TMs without erythema, bulging. Nares clear with no erythema, swelling, mucus on turbinates. No ulcers, cracking, on lips. Good dentition. No erythema, swelling, or exudate on post pharynx. Tongue normal, non-obstructing. Tonsils not swollen or erythematous. Hearing normal.  Neck: Supple, thyroid normal. No bruits or JVD. Respiratory: Respiratory effort normal, BS equal bilaterally without rales, rhonci, wheezing or stridor. Cardio: Heart sounds normal, regular rate and rhythm without murmurs, rubs or gallops. Peripheral pulses brisk and equal bilaterally, without edema. No aortic or femoral bruits. Chest: symmetric,  with normal excursions and percussion. Breasts: Symmetric, without lumps, nipple discharge, retractions, or fibrocystic changes.  Abdomen: Flat, soft, with bowl sounds. Nontender, no guarding, rebound, hernias, masses, or organomegaly.  Lymphatics: Non tender without lymphadenopathy.  Genitourinary:  Musculoskeletal: Full ROM all peripheral extremities, joint stability, 5/5 strength, and normal gait. Skin: Warm, dry without rashes, lesions, ecchymosis.  Neuro: Cranial nerves intact, reflexes equal bilaterally. Normal muscle tone, no cerebellar symptoms. Sensation intact.  Pysch: Awake and oriented X 3, normal affect, Insight and Judgment appropriate.   Assessment and Plan  1. HTN 2. ASHD S/P PTCA 3. Hyperlipidemia 4. NIDDM 5. DJD 6. Vitamin D Deficiency 7. Lt. Upper Extremity pain

## 2013-07-20 ENCOUNTER — Telehealth: Payer: Self-pay | Admitting: *Deleted

## 2013-07-20 LAB — URINALYSIS, COMPLETE
Bilirubin Urine: NEGATIVE
Casts: NONE SEEN
Crystals: NONE SEEN
Glucose, UA: >1000 mg/dL — AB
Hgb urine dipstick: NEGATIVE
Nitrite: NEGATIVE
Protein, ur: NEGATIVE mg/dL
Specific Gravity, Urine: 1.027 (ref 1.005–1.030)
Squamous Epithelial / LPF: NONE SEEN
pH: 5.5 (ref 5.0–8.0)

## 2013-07-20 LAB — MICROALBUMIN / CREATININE URINE RATIO
Creatinine, Urine: 66.4 mg/dL
Microalb Creat Ratio: 7.5 mg/g (ref 0.0–30.0)

## 2013-07-20 LAB — INSULIN, FASTING: Insulin fasting, serum: 54 u[IU]/mL — ABNORMAL HIGH (ref 3–28)

## 2013-07-20 NOTE — Telephone Encounter (Signed)
Message copied by Reggy Eye on Thu Jul 20, 2013  5:10 PM ------      Message from: Lucky Cowboy      Created: Thu Jul 20, 2013  9:00 AM       Chol 100 /LDL  21 great - continue chol med same - insulin level is high - stricter diet & wt loss - vit 81 great - thyroid still borderline high - continue to monitor ------

## 2013-07-20 NOTE — Telephone Encounter (Signed)
Pt aware of labs  

## 2013-08-02 ENCOUNTER — Other Ambulatory Visit: Payer: Self-pay

## 2013-08-02 MED ORDER — NITROGLYCERIN 0.4 MG SL SUBL: 0.4000 mg | SUBLINGUAL_TABLET | SUBLINGUAL | Status: DC | PRN

## 2013-08-03 ENCOUNTER — Telehealth: Payer: Self-pay

## 2013-08-03 NOTE — Telephone Encounter (Signed)
This is Dr. Anne Fu Pt. To [facility]

## 2013-08-16 ENCOUNTER — Encounter: Payer: Self-pay | Admitting: Internal Medicine

## 2013-08-28 ENCOUNTER — Encounter: Payer: Self-pay | Admitting: Internal Medicine

## 2013-08-29 ENCOUNTER — Ambulatory Visit (INDEPENDENT_AMBULATORY_CARE_PROVIDER_SITE_OTHER): Payer: BC Managed Care – PPO | Admitting: Physician Assistant

## 2013-08-29 ENCOUNTER — Encounter: Payer: Self-pay | Admitting: Physician Assistant

## 2013-08-29 VITALS — BP 110/68 | HR 72 | Temp 97.9°F | Resp 16 | Ht 68.5 in | Wt 197.0 lb

## 2013-08-29 DIAGNOSIS — H04129 Dry eye syndrome of unspecified lacrimal gland: Secondary | ICD-10-CM

## 2013-08-29 DIAGNOSIS — M26609 Unspecified temporomandibular joint disorder, unspecified side: Secondary | ICD-10-CM

## 2013-08-29 DIAGNOSIS — H04123 Dry eye syndrome of bilateral lacrimal glands: Secondary | ICD-10-CM

## 2013-08-29 MED ORDER — BACLOFEN 10 MG PO TABS: 10.0000 mg | ORAL_TABLET | Freq: Two times a day (BID) | ORAL | Status: DC

## 2013-08-29 NOTE — Patient Instructions (Addendum)
Switch invokana to the morning Do baclofen at night , 1-2 pills for 3-5 days Do soft foods Do heating pad And try to relax  What is the TMJ? The temporomandibular (tem-PUH-ro-man-DIB-yoo-ler) joint, or the TMJ, connects the upper and lower jawbones. This joint allows the jaw to open wide and move back and forth when you chew, talk, or yawn.There are also several muscles that help this joint move. There can be muscle tightness and pain in the muscle that can cause several symptoms.  What causes TMJ pain? There are many causes of TMJ pain. Repeated chewing (for example, chewing gum) and clenching your teeth can cause pain in the joint. Some TMJ pain has no obvious cause. What can I do to ease the pain? There are many things you can do to help your pain get better. When you have pain:  Eat soft foods and stay away from chewy foods (for example, taffy) Try to use both sides of your mouth to chew Don't chew gum Don't open your mouth wide (for example, during yawning or singing) Don't bite your cheeks or fingernails Lower your amount of stress and worry Applying a warm, damp washcloth to the joint may help. Over-the-counter pain medicines such as ibuprofen (one brand: Advil) or acetaminophen (one brand: Tylenol) might also help. Do not use these medicines if you are allergic to them or if your doctor told you not to use them. How can I stop the pain from coming back? When your pain is better, you can do these exercises to make your muscles stronger and to keep the pain from coming back:  Resisted mouth opening: Place your thumb or two fingers under your chin and open your mouth slowly, pushing up lightly on your chin with your thumb. Hold for three to six seconds. Close your mouth slowly. Resisted mouth closing: Place your thumbs under your chin and your two index fingers on the ridge between your mouth and the bottom of your chin. Push down lightly on your chin as you close your mouth. Tongue up:  Slowly open and close your mouth while keeping the tongue touching the roof of the mouth. Side-to-side jaw movement: Place an object about one fourth of an inch thick (for example, two tongue depressors) between your front teeth. Slowly move your jaw from side to side. Increase the thickness of the object as the exercise becomes easier Forward jaw movement: Place an object about one fourth of an inch thick between your front teeth and move the bottom jaw forward so that the bottom teeth are in front of the top teeth. Increase the thickness of the object as the exercise becomes easier. These exercises should not be painful. If it hurts to do these exercises, stop doing them and talk to your family doctor.

## 2013-08-29 NOTE — Progress Notes (Signed)
HPI Patient was recently here for CPE, for the past month he states he has been having problems with his eyes, ears, and throat. ENT thought it was TMJ. He states his eyes water, his head feels tight, he has ear pain right ear that radiates down his neck. Patient is currently chewing gum. Patient states that he has been taking invokana at night and has not been sleeping well because he has been peeing every hour or 6 times a night. Denies changes in vision, CP, SOB, nausea, AB pain, etc.   Past Medical History  Diagnosis Date  . Diabetes mellitus without complication   . Hypertension   . GERD (gastroesophageal reflux disease)   . Chronic back pain   . CAD (coronary artery disease)      Allergies  Allergen Reactions  . Ibuprofen     GI upset  . Naproxen     GI upset      Current Outpatient Prescriptions on File Prior to Visit  Medication Sig Dispense Refill  . aspirin 81 MG tablet Take 81 mg by mouth daily.      Marland Kitchen atorvastatin (LIPITOR) 40 MG tablet Take 40 mg by mouth daily.      . Canagliflozin (INVOKANA) 300 MG TABS Take 0.5 tablets (150 mg total) by mouth daily.  30 tablet  99  . Cholecalciferol (GNP VITAMIN D SUPER STRENGTH) 5000 UNITS TABS Take 1 tablet (5,000 Units total) by mouth 1 day or 1 dose.  30 tablet  99  . enalapril (VASOTEC) 20 MG tablet Take 20 mg by mouth daily.      Marland Kitchen glipiZIDE (GLUCOTROL) 5 MG tablet Take 5 mg by mouth 2 (two) times daily before a meal.      . levofloxacin (LEVAQUIN) 500 MG tablet Take 500 mg by mouth daily. Last dose due on 06/12/13      . Magnesium 250 MG TABS Take 250 mg by mouth daily.      . metFORMIN (GLUCOPHAGE) 1000 MG tablet Take 1,000 mg by mouth 2 (two) times daily with a meal.      . metoCLOPramide (REGLAN) 10 MG tablet Take 1 tablet (10 mg total) by mouth at bedtime.  90 tablet  99  . montelukast (SINGULAIR) 10 MG tablet Take 10 mg by mouth at bedtime.      . nebivolol (BYSTOLIC) 10 MG tablet Take 10 mg by mouth daily.      .  nitroGLYCERIN (NITROSTAT) 0.4 MG SL tablet Place 1 tablet (0.4 mg total) under the tongue every 5 (five) minutes x 3 doses as needed for chest pain.  25 tablet  0  . Omega-3 Fatty Acids (FISH OIL) 1000 MG CAPS Take 1 capsule (1,000 mg total) by mouth 2 (two) times daily.  360 capsule  0  . pantoprazole (PROTONIX) 40 MG tablet Take 40 mg by mouth daily.       No current facility-administered medications on file prior to visit.    ROS: all negative expect above.   Physical: Filed Weights   08/29/13 1330  Weight: 197 lb (89.359 kg)   Filed Vitals:   08/29/13 1330  BP: 110/68  Pulse: 72  Temp: 97.9 F (36.6 C)  Resp: 16   General Appearance: Well nourished, in no apparent distress. Eyes: PERRLA, EOMs. Sinuses: No Frontal/maxillary tenderness ENT/Mouth: + TMJ tenderness Ext aud canals clear, normal light reflex with TMs without erythema, bulging. Post pharynx without erythema, swelling, exudate.  Respiratory: CTAB Cardio: RRR, no murmurs, rubs or  gallops. Peripheral pulses brisk and equal bilaterally, without edema. No aortic or femoral bruits. Abdomen: Soft, with bowl sounds. Nontender, no guarding, rebound. Lymphatics: Non tender without lymphadenopathy.  Musculoskeletal: Full ROM all peripheral extremities, 5/5 strength, and normal gait. Skin: Warm, dry without rashes, lesions, ecchymosis.  Neuro: Cranial nerves intact, reflexes equal bilaterally. Normal muscle tone, no cerebellar symptoms. Sensation intact.  Pysch: Awake and oriented X 3, normal affect, Insight and Judgment appropriate.   Assessment and Plan: Headache likely from TMJ- given information and given Baclofen for at night Watery eyes- may be from TMJ but more likely dry eyes, suggest seeing eye doctor and doing drops DM- switch farxiga to AM Follow up in 2 months

## 2013-08-29 NOTE — Progress Notes (Deleted)
HPI Patient presents for 3 month follow up with hypertension, hyperlipidemia, diabetes and vitamin D. Patient's blood pressure has been controlled at home. Patient denies chest pain, shortness of breath, dizziness.  Patient's cholesterol is diet controlled. In addition they are on lipitor and denies myalgias. The cholesterol last visit was  Lab Results  Component Value Date   CHOL 100 07/19/2013   HDL 42 07/19/2013   LDLCALC 21 07/19/2013   TRIG 187* 07/19/2013   CHOLHDL 2.4 07/19/2013  . The patient has been working on diet and exercise for Diabetes, and denies changes in vision, polys, and paresthesias.  Patient is on Vitamin D supplement.   Current Medications:  Current Outpatient Prescriptions on File Prior to Visit  Medication Sig Dispense Refill  . aspirin 81 MG tablet Take 81 mg by mouth daily.      Marland Kitchen atorvastatin (LIPITOR) 40 MG tablet Take 40 mg by mouth daily.      . Canagliflozin (INVOKANA) 300 MG TABS Take 0.5 tablets (150 mg total) by mouth daily.  30 tablet  99  . Cholecalciferol (GNP VITAMIN D SUPER STRENGTH) 5000 UNITS TABS Take 1 tablet (5,000 Units total) by mouth 1 day or 1 dose.  30 tablet  99  . enalapril (VASOTEC) 20 MG tablet Take 20 mg by mouth daily.      Marland Kitchen glipiZIDE (GLUCOTROL) 5 MG tablet Take 5 mg by mouth 2 (two) times daily before a meal.      . levofloxacin (LEVAQUIN) 500 MG tablet Take 500 mg by mouth daily. Last dose due on 06/12/13      . Magnesium 250 MG TABS Take 250 mg by mouth daily.      . metFORMIN (GLUCOPHAGE) 1000 MG tablet Take 1,000 mg by mouth 2 (two) times daily with a meal.      . metoCLOPramide (REGLAN) 10 MG tablet Take 1 tablet (10 mg total) by mouth at bedtime.  90 tablet  99  . montelukast (SINGULAIR) 10 MG tablet Take 10 mg by mouth at bedtime.      . nebivolol (BYSTOLIC) 10 MG tablet Take 10 mg by mouth daily.      . nitroGLYCERIN (NITROSTAT) 0.4 MG SL tablet Place 1 tablet (0.4 mg total) under the tongue every 5 (five) minutes x 3 doses as  needed for chest pain.  25 tablet  0  . Omega-3 Fatty Acids (FISH OIL) 1000 MG CAPS Take 1 capsule (1,000 mg total) by mouth 2 (two) times daily.  360 capsule  0  . pantoprazole (PROTONIX) 40 MG tablet Take 40 mg by mouth daily.       No current facility-administered medications on file prior to visit.   Medical History:  Past Medical History  Diagnosis Date  . Diabetes mellitus without complication   . Hypertension   . GERD (gastroesophageal reflux disease)   . Chronic back pain   . CAD (coronary artery disease)    Allergies:  Allergies  Allergen Reactions  . Ibuprofen     GI upset  . Naproxen     GI upset    ROS Constitutional: Denies fever, chills, headaches, insomnia, fatigue, night sweats Eyes: Denies redness, blurred vision, diplopia, discharge, itchy, watery eyes.  ENT: Denies congestion, post nasal drip, sore throat, earache, dental pain, Tinnitus, Vertigo, Sinus pain, snoring.  Cardio: Denies chest pain, palpitations, irregular heartbeat, dyspnea, diaphoresis, orthopnea, PND, claudication, edema Respiratory: denies cough, shortness of breath, wheezing.  Gastrointestinal: Denies dysphagia, heartburn, AB pain/ cramps, N/V, diarrhea, constipation,  hematemesis, melena, hematochezia,  hemorrhoids Genitourinary: Denies dysuria, frequency, urgency, nocturia, hesitancy, discharge, hematuria, flank pain Musculoskeletal: Denies myalgia, stiffness, pain, swelling and strain/sprain. Skin: Denies pruritis, rash, changing in skin lesion Neuro: Denies Weakness, tremor, incoordination, spasms, pain Psychiatric: Denies confusion, memory loss, sensory loss Endocrine: Denies change in weight, skin, hair change, nocturia Diabetic Polys, Denies visual blurring, hyper /hypo glycemic episodes, and paresthesia, Heme/Lymph: Denies Excessive bleeding, bruising, enlarged lymph nodes  Family history- Review and unchanged Social history- Review and unchanged Physical Exam: There were no vitals  filed for this visit. There were no vitals filed for this visit. General Appearance: Well nourished, in no apparent distress. Eyes: PERRLA, EOMs, conjunctiva no swelling or erythema Sinuses: No Frontal/maxillary tenderness ENT/Mouth: Ext aud canals clear, TMs without erythema, bulging. No erythema, swelling, or exudate on post pharynx.  Tonsils not swollen or erythematous. Hearing normal.  Neck: Supple, thyroid normal.  Respiratory: Respiratory effort normal, BS equal bilaterally without rales, rhonchi, wheezing or stridor.  Cardio: RRR with no MRGs. Brisk peripheral pulses without edema.  Abdomen: Soft, + BS.  Non tender, no guarding, rebound, hernias, masses. Lymphatics: Non tender without lymphadenopathy.  Musculoskeletal: Full ROM, 5/5 strength, normal gait.  Skin: Warm, dry without rashes, lesions, ecchymosis.  Neuro: Cranial nerves intact. No cerebellar symptoms. Sensation intact.  Psych: Awake and oriented X 3, normal affect, Insight and Judgment appropriate.   Assessment and Plan:  Hypertension: Continue medication, monitor blood pressure at home.  Continue DASH diet. Cholesterol: Continue diet and exercise. Check cholesterol.  Diabetes-Continue diet and exercise. Check A1C Vitamin D Def- check level and continue medications.   Continue diet and meds as discussed. Further disposition pending results of labs. Discussed med's effects and SE's.    Michael Guzman 1:33 PM

## 2013-10-01 ENCOUNTER — Other Ambulatory Visit: Payer: Self-pay | Admitting: *Deleted

## 2013-10-01 DIAGNOSIS — E785 Hyperlipidemia, unspecified: Secondary | ICD-10-CM

## 2013-10-11 ENCOUNTER — Ambulatory Visit (INDEPENDENT_AMBULATORY_CARE_PROVIDER_SITE_OTHER): Payer: BC Managed Care – PPO | Admitting: Physician Assistant

## 2013-10-11 ENCOUNTER — Encounter: Payer: Self-pay | Admitting: Internal Medicine

## 2013-10-11 ENCOUNTER — Encounter: Payer: Self-pay | Admitting: Physician Assistant

## 2013-10-11 VITALS — BP 118/78 | HR 68 | Temp 98.4°F | Resp 16 | Wt 198.0 lb

## 2013-10-11 DIAGNOSIS — J209 Acute bronchitis, unspecified: Secondary | ICD-10-CM

## 2013-10-11 MED ORDER — LEVOFLOXACIN 500 MG PO TABS: 500.0000 mg | ORAL_TABLET | Freq: Every day | ORAL | Status: AC

## 2013-10-11 MED ORDER — PROMETHAZINE-CODEINE 6.25-10 MG/5ML PO SYRP: 5.0000 mL | ORAL_SOLUTION | Freq: Four times a day (QID) | ORAL | Status: DC | PRN

## 2013-10-11 MED ORDER — PREDNISONE 20 MG PO TABS: ORAL_TABLET | ORAL | Status: DC

## 2013-10-11 NOTE — Patient Instructions (Signed)
Please take the prednisone to help decrease inflammation and therefore decrease symptoms. Take it it with food to avoid GI upset. It can cause increased energy but on the other hand it can make it hard to sleep at night so please take it in the morning.  It is not an antibiotic so you can stop it early if you are feeling better.  If you are diabetic it will increase your sugars.   The majority of colds are caused by viruses and do not require antibiotics. Please read the rest of this hand out to learn more about the common cold and what you can do to help yourself as well as help prevent the over use of antibiotics.   COMMON COLD SIGNS AND SYMPTOMS - The common cold usually causes nasal congestion, runny nose, and sneezing. A sore throat may be present on the first day but usually resolves quickly. If a cough occurs, it generally develops on about the fourth or fifth day of symptoms, typically when congestion and runny nose are resolving  COMMON COLD COMPLICATIONS - In most cases, colds do not cause serious illness or complications. Most colds last for three to seven days, although many people continue to have symptoms (coughing, sneezing, congestion) for up to two weeks.  One of the more common complications is sinusitis, which is usually caused by viruses and rarely (about 2 percent of the time) by bacteria. Having thick or yellow to green-colored nasal discharge does not mean that bacterial sinusitis has developed; discolored nasal discharge is a normal phase of the common cold.  Lower respiratory infections, such as pneumonia or bronchitis, may develop following a cold.  Infection of the middle ear, or otitis media, can accompany or follow a cold.  COMMON COLD TREATMENT - There is no specific treatment for the viruses that cause the common cold. Most treatments are aimed at relieving some of the symptoms of the cold, but do not shorten or cure the cold. Antibiotics are not useful for treating the  common cold; antibiotics are only used to treat illnesses caused by bacteria, not viruses. Unnecessary use of antibiotics for the treatment of the common cold can cause allergic reactions, diarrhea, or other gastrointestinal symptoms in some patients.  The symptoms of a cold will resolve over time, even without any treatment. People with underlying medical conditions and those who use other over-the-counter or prescription medications should speak with their healthcare provider or pharmacist to ensure that it is safe to use these treatments. The following are treatments that may reduce the symptoms caused by the common cold.  Nasal congestion - Decongestants are good for nasal congestion- if you feel very stuffy but no mucus is coming out, this is the medication that will help you the most.  Pseudoephedrine is a decongestant that can improve nasal congestion. Although a prescription is not required, drugstores in the [place] keep pseudoephedrine behind the counter, so it must be requested from a pharmacist. If you have a heart condition or high blood pressure please use Coricidin BPH instead.   Runny nose - Antihistamines such as diphenhydramine (Benadryl), certazine (Zyrtec) which are best taking at night because they can make you tired OR loratadine (Claritin),  fexafinadine (Allegra) help with a runny nose.   Nasal sprays such an oxymetazoline (Afrin and others) may also give temporary relief of nasal congestion. However, these sprays should never be used for more than two to three days; use for more than three days use can worsen congestion.    Nasocort is now over the counter and can help decrease a runny nose. Please stop the medication if you have blurry vision or nose bleeds.   Sore throat and headache - Sore throat and headache are best treated with a mild pain reliever such as acetaminophen (Tylenol) or a non-steroidal anti-inflammatory agent such as ibuprofen or naproxen (Motrin or Aleve).  These medications should be taken with food to prevent stomach problems. As well as gargling with warm water and salt.   Cough - Common cough medicine ingredients include guaifenesin and dextromethorphan; these are often combined with other medications in over-the-counter cold formulas. Often a cough is worse at night or first in the morning due to post nasal drip from you nose. You can try to sleep at an angle to decrease a cough.   Alternative treatments - Heated, humidified air can improve symptoms of nasal congestion and runny nose, and causes few to no side effects. A number of alternative products, including vitamin C, doubling up on your vitamin D and herbal products such as echinacea, may help. Certain products, such as nasal gels that contain zinc (eg, Zicam), have been associated with a permanent loss of smell.  Antibiotics - Antibiotics should not be used to treat an uncomplicated common cold. As noted above, colds are caused by viruses. Antibiotics treat bacterial, not viral infections. Some viruses that cause the common cold can also depress the immune system or cause swelling in the lining of the nose or airways; this can, in turn, lead to a bacterial infection. Often you need to give your body 7 days to fight off a common cold while treating the symptoms with the medications listed above. If after 7 days your symptoms are not improving, you are getting worse, you have shortness of breath, chest pain, a fever of over 103 you should seek medical help immediately.   PREVENTION IS THE BEST MEDICINE - Hand washing is an essential and highly effective way to prevent the spread of infection.  Alcohol-based hand rubs are a good alternative for disinfecting hands if a sink is not available.  Hands should be washed before preparing food and eating and after coughing, blowing the nose, or sneezing. While it is not always possible to limit contact with people who may be infected with a cold, touching  the eyes, nose, or mouth after direct contact should be avoided when possible. Sneezing/coughing into the sleeve of one's clothing (at the inner elbow) is another means of containing sprays of saliva and secretions and does not contaminate the hands.     

## 2013-10-11 NOTE — Progress Notes (Signed)
   Subjective:    Patient ID: Michael Guzman, male    DOB: Dec 03, 1945, 68 y.o.   MRN: [id number]  Cough This is a new problem. The current episode started 1 to 4 weeks ago. The problem has been gradually worsening. The cough is productive of purulent sputum. Associated symptoms include chills, myalgias, nasal congestion, postnasal drip, rhinorrhea and wheezing. Pertinent negatives include no chest pain, ear congestion, ear pain, fever, headaches, heartburn, hemoptysis, rash, sore throat, shortness of breath, sweats or weight loss. He has tried rest, OTC cough suppressant and leukotriene antagonists for the symptoms. The treatment provided mild relief. His past medical history is significant for COPD.    Review of Systems  Constitutional: Positive for chills and fatigue. Negative for fever, weight loss and diaphoresis.  HENT: Positive for congestion, postnasal drip and rhinorrhea. Negative for ear pain and sore throat.   Respiratory: Positive for cough and wheezing. Negative for hemoptysis, chest tightness and shortness of breath.   Cardiovascular: Negative for chest pain, palpitations and leg swelling.  Gastrointestinal: Negative.  Negative for heartburn.  Genitourinary: Negative.   Musculoskeletal: Positive for myalgias.  Skin: Negative for rash.  Neurological: Negative for headaches.       Objective:   Physical Exam  Constitutional: He appears well-developed and well-nourished.  HENT:  Head: Normocephalic and atraumatic.  Right Ear: External ear normal.  Left Ear: External ear normal.  Nose: Right sinus exhibits maxillary sinus tenderness. Left sinus exhibits maxillary sinus tenderness.  Mouth/Throat: Oropharynx is clear and moist.  Eyes: Conjunctivae are normal. Pupils are equal, round, and reactive to light.  Neck: Normal range of motion. Neck supple.  Cardiovascular: Normal rate, regular rhythm and normal heart sounds.   Pulmonary/Chest: Effort normal and breath sounds normal.  He has no wheezes.  Abdominal: Soft. Bowel sounds are normal.  Lymphadenopathy:    He has no cervical adenopathy.  Skin: Skin is warm and dry.      Assessment & Plan:   Acute bronchitis - Plan: predniSONE (DELTASONE) 20 MG tablet, levofloxacin (LEVAQUIN) 500 MG tablet, promethazine-codeine (PHENERGAN WITH CODEINE) 6.25-10 MG/5ML syrup

## 2013-10-30 ENCOUNTER — Ambulatory Visit (INDEPENDENT_AMBULATORY_CARE_PROVIDER_SITE_OTHER): Payer: BC Managed Care – PPO | Admitting: *Deleted

## 2013-10-30 DIAGNOSIS — I10 Essential (primary) hypertension: Secondary | ICD-10-CM

## 2013-10-30 DIAGNOSIS — E785 Hyperlipidemia, unspecified: Secondary | ICD-10-CM

## 2013-10-30 LAB — LIPID PANEL
CHOLESTEROL: 110 mg/dL (ref 0–200)
HDL: 41.7 mg/dL (ref >39.00)
LDL Cholesterol: 30 mg/dL (ref 0–99)
Total CHOL/HDL Ratio: 3
Triglycerides: 191 mg/dL — ABNORMAL HIGH (ref 0.0–149.0)
VLDL: 38.2 mg/dL (ref 0.0–40.0)

## 2013-10-30 LAB — ALT: ALT: 26 U/L (ref 0–53)

## 2013-10-30 LAB — LDL CHOLESTEROL, DIRECT: LDL DIRECT: 45.7 mg/dL

## 2013-11-06 ENCOUNTER — Encounter: Payer: Self-pay | Admitting: Cardiology

## 2013-11-06 ENCOUNTER — Ambulatory Visit (INDEPENDENT_AMBULATORY_CARE_PROVIDER_SITE_OTHER): Payer: BC Managed Care – PPO | Admitting: Cardiology

## 2013-11-06 ENCOUNTER — Other Ambulatory Visit: Payer: BC Managed Care – PPO

## 2013-11-06 VITALS — BP 110/82 | HR 55 | Ht 70.0 in | Wt 194.0 lb

## 2013-11-06 DIAGNOSIS — I10 Essential (primary) hypertension: Secondary | ICD-10-CM

## 2013-11-06 DIAGNOSIS — E785 Hyperlipidemia, unspecified: Secondary | ICD-10-CM

## 2013-11-06 DIAGNOSIS — I2510 Atherosclerotic heart disease of native coronary artery without angina pectoris: Secondary | ICD-10-CM

## 2013-11-06 MED ORDER — METOPROLOL SUCCINATE ER 100 MG PO TB24: 100.0000 mg | ORAL_TABLET | Freq: Every day | ORAL | Status: DC

## 2013-11-06 MED ORDER — ATORVASTATIN CALCIUM 40 MG PO TABS: 20.0000 mg | ORAL_TABLET | Freq: Every day | ORAL | Status: DC

## 2013-11-06 NOTE — Patient Instructions (Signed)
Your physician has recommended you make the following change in your medication:   1. Stop Bystolic 2. Decrease Atorvastatin to 20mg  (1/2 tablet) once daily. 3. Start Metoprolol ER 100mg  once daily  Your physician recommends that you schedule a follow-up appointment in: 2 months with Dr. Marlou Porch.

## 2013-11-06 NOTE — Progress Notes (Signed)
[facility] [address] [place], [place]  [address] Phone: [phone] Fax:  [phone]  Date:  11/06/2013   ID:  Michael Guzman, Michael Guzman December 15, 1945, MRN [id number]  PCP:  Alesia Richards, MD   History of Present Illness: Michael Guzman is a 68 y.o. male with coronary artery disease 10/13 drug eluting stent to mid LAD and OM2, normal ejection fraction with recurrent chest discomfort, repeat catheterization reassuring.  I changed his Brilinta to Plavix.  Overall doing well. Considering retiring. [place]..  Patient denies palpitations, dizziness, syncope, swelling SOB, , nor PND. Weather been bad, feels worn out at times. Left abdominal pain, bubble like when eating. Occasionally will start in his left upper abdomen and radiates to his left shoulder. He has had workup for this.    Wt Readings from Last 3 Encounters:  11/06/13 194 lb (87.998 kg)  10/11/13 198 lb (89.812 kg)  08/29/13 197 lb (89.359 kg)     Past Medical History  Diagnosis Date  . Diabetes mellitus without complication   . Hypertension   . GERD (gastroesophageal reflux disease)   . Chronic back pain   . CAD (coronary artery disease)     Past Surgical History  Procedure Laterality Date  . Appendectomy    . Cardiac surgery      3 stent placed.    Current Outpatient Prescriptions  Medication Sig Dispense Refill  . aspirin 81 MG tablet Take 81 mg by mouth daily.      Marland Kitchen atorvastatin (LIPITOR) 40 MG tablet Take 40 mg by mouth daily.      . baclofen (LIORESAL) 10 MG tablet Take 1 tablet (10 mg total) by mouth 2 (two) times daily.  60 tablet  1  . Canagliflozin (INVOKANA) 300 MG TABS Take 0.5 tablets (150 mg total) by mouth daily.  30 tablet  99  . Cholecalciferol (GNP VITAMIN D SUPER STRENGTH) 5000 UNITS TABS Take 1 tablet (5,000 Units total) by mouth 1 day or 1 dose.  30 tablet  99  . enalapril (VASOTEC) 20 MG tablet Take 20 mg by mouth daily.      Marland Kitchen glipiZIDE (GLUCOTROL) 5 MG tablet Take  5 mg by mouth 2 (two) times daily before a meal.      . Magnesium 250 MG TABS Take 250 mg by mouth daily.      . metFORMIN (GLUCOPHAGE) 1000 MG tablet Take 1,000 mg by mouth 2 (two) times daily with a meal.      . metoCLOPramide (REGLAN) 10 MG tablet Take 1 tablet (10 mg total) by mouth at bedtime.  90 tablet  99  . montelukast (SINGULAIR) 10 MG tablet Take 10 mg by mouth at bedtime.      . nebivolol (BYSTOLIC) 10 MG tablet Take 10 mg by mouth daily.      . nitroGLYCERIN (NITROSTAT) 0.4 MG SL tablet Place 1 tablet (0.4 mg total) under the tongue every 5 (five) minutes x 3 doses as needed for chest pain.  25 tablet  0  . Omega-3 Fatty Acids (FISH OIL) 1000 MG CAPS Take 1 capsule (1,000 mg total) by mouth 2 (two) times daily.  360 capsule  0  . pantoprazole (PROTONIX) 40 MG tablet Take 40 mg by mouth daily.      . promethazine-codeine (PHENERGAN WITH CODEINE) 6.25-10 MG/5ML syrup Take 5 mLs by mouth every 6 (six) hours as needed for cough.  240 mL  0  . predniSONE (DELTASONE) 20 MG  tablet Take one pill two times daily for 3 days, take one pill daily for 4 days.  10 tablet  0   No current facility-administered medications for this visit.    Allergies:    Allergies  Allergen Reactions  . Ibuprofen     GI upset  . Naproxen     GI upset    Social History:  The patient  reports that he quit smoking about 2 years ago. He has never used smokeless tobacco. He reports that he does not drink alcohol or use illicit drugs.   ROS:  Please see the history of present illness.   Denies any fevers, chills, orthopnea, PND. He feels sinus congestion, run down at times.    PHYSICAL EXAM: VS:  BP 110/82  Pulse 55  Ht 5\' 10"  (1.778 m)  Wt 194 lb (87.998 kg)  BMI 27.84 kg/m2  SpO2 99% Well nourished, well developed, in no acute distress HEENT: normal Neck: no JVD Cardiac:  normal S1, S2; RRR; no murmur Lungs:  clear to auscultation bilaterally, no wheezing, rhonchi or rales Abd: soft, nontender, no  hepatomegaly Ext: no edema Skin: warm and dry Neuro: no focal abnormalities noted  EKG:  No EKG today.  ASSESSMENT AND PLAN:  1. Coronary artery disease-postintervention to LAD, OM. Doing well. No anginal symptoms.I will continue with dual antiplatelet therapy. No evidence of bleeding. 2. Hypertension- Bystolic is quite expensive for him. Let us go back and try metoprolol succinate 100 mg once a day. 3. Sinus congestion-continuing to try Mucinex/Allegra. 4. Hyperlipidemia-LDL calculated is 30, direct is 45. I will decrease his atorvastatin to 20 mg. Recheck lipid profile in one year. 5. Hypertension-good control. 6. I will see him back in 2 months after making medication change.  Signed, Candee Furbish, MD [facility]  11/06/2013 1:55 PM

## 2013-11-13 ENCOUNTER — Ambulatory Visit (INDEPENDENT_AMBULATORY_CARE_PROVIDER_SITE_OTHER): Payer: BC Managed Care – PPO | Admitting: Physician Assistant

## 2013-11-13 VITALS — BP 100/54 | HR 68 | Temp 99.1°F | Resp 14 | Wt 195.0 lb

## 2013-11-13 DIAGNOSIS — Z79899 Other long term (current) drug therapy: Secondary | ICD-10-CM

## 2013-11-13 DIAGNOSIS — R7309 Other abnormal glucose: Secondary | ICD-10-CM

## 2013-11-13 DIAGNOSIS — E119 Type 2 diabetes mellitus without complications: Secondary | ICD-10-CM

## 2013-11-13 DIAGNOSIS — Z9861 Coronary angioplasty status: Secondary | ICD-10-CM

## 2013-11-13 DIAGNOSIS — E559 Vitamin D deficiency, unspecified: Secondary | ICD-10-CM

## 2013-11-13 DIAGNOSIS — E785 Hyperlipidemia, unspecified: Secondary | ICD-10-CM

## 2013-11-13 DIAGNOSIS — I10 Essential (primary) hypertension: Secondary | ICD-10-CM

## 2013-11-13 DIAGNOSIS — K219 Gastro-esophageal reflux disease without esophagitis: Secondary | ICD-10-CM

## 2013-11-13 DIAGNOSIS — I2510 Atherosclerotic heart disease of native coronary artery without angina pectoris: Secondary | ICD-10-CM

## 2013-11-13 LAB — CBC WITH DIFFERENTIAL/PLATELET
BASOS ABS: 0.1 10*3/uL (ref 0.0–0.1)
Basophils Relative: 1 % (ref 0–1)
EOS ABS: 0.3 10*3/uL (ref 0.0–0.7)
EOS PCT: 4 % (ref 0–5)
HCT: 40.9 % (ref 39.0–52.0)
Hemoglobin: 13.5 g/dL (ref 13.0–17.0)
Lymphocytes Relative: 35 % (ref 12–46)
Lymphs Abs: 2.7 10*3/uL (ref 0.7–4.0)
MCH: 28.3 pg (ref 26.0–34.0)
MCHC: 33 g/dL (ref 30.0–36.0)
MCV: 85.7 fL (ref 78.0–100.0)
Monocytes Absolute: 0.8 10*3/uL (ref 0.1–1.0)
Monocytes Relative: 10 % (ref 3–12)
Neutro Abs: 3.8 10*3/uL (ref 1.7–7.7)
Neutrophils Relative %: 50 % (ref 43–77)
PLATELETS: 287 10*3/uL (ref 150–400)
RBC: 4.77 MIL/uL (ref 4.22–5.81)
RDW: 13.7 % (ref 11.5–15.5)
WBC: 7.6 10*3/uL (ref 4.0–10.5)

## 2013-11-13 MED ORDER — LEVOFLOXACIN 500 MG PO TABS: 500.0000 mg | ORAL_TABLET | Freq: Every day | ORAL | Status: AC

## 2013-11-13 MED ORDER — FLUTICASONE PROPIONATE 50 MCG/ACT NA SUSP: 1.0000 | Freq: Every day | NASAL | Status: DC

## 2013-11-13 MED ORDER — AZELASTINE HCL 0.1 % NA SOLN: 2.0000 | Freq: Two times a day (BID) | NASAL | Status: DC

## 2013-11-13 NOTE — Progress Notes (Signed)
HPI 68 y.o. male  presents for 3 month follow up with hypertension, hyperlipidemia, diabetes and vitamin D. His blood pressure has been controlled at home, today their BP is BP: 100/54 mmHg He does not workout. He denies chest pain, dizziness. He does complain of SOB with exertion, occasionally with talking. Normal CXR 05/2013.  He is on cholesterol medication and denies myalgias. His cholesterol is at goal. The cholesterol last visit was:   Lab Results  Component Value Date   CHOL 110 10/30/2013   HDL 41.70 10/30/2013   LDLCALC 30 10/30/2013   LDLDIRECT 45.7 10/30/2013   TRIG 191.0* 10/30/2013   CHOLHDL 3 10/30/2013   He has been working on diet and exercise for Diabetes, and denies foot ulcerations, nausea, paresthesia of the feet, polydipsia, polyuria and visual disturbances. Last A1C in the office was:  Lab Results  Component Value Date   HGBA1C 8.5* 06/25/2012   Patient is on Vitamin D supplement.   He recently saw his cardilogist, Dr. Daymon Larsen on Feb 16.  Sinus symptoms for rhinorrhea, sinus pain, fever, chills for several weeks 2-3 weeks.   Current Medications:  Current Outpatient Prescriptions on File Prior to Visit  Medication Sig Dispense Refill  . aspirin 81 MG tablet Take 81 mg by mouth daily.      Marland Kitchen atorvastatin (LIPITOR) 40 MG tablet Take 0.5 tablets (20 mg total) by mouth daily.  30 tablet  3  . baclofen (LIORESAL) 10 MG tablet Take 1 tablet (10 mg total) by mouth 2 (two) times daily.  60 tablet  1  . Canagliflozin (INVOKANA) 300 MG TABS Take 0.5 tablets (150 mg total) by mouth daily.  30 tablet  99  . Cholecalciferol (GNP VITAMIN D SUPER STRENGTH) 5000 UNITS TABS Take 1 tablet (5,000 Units total) by mouth 1 day or 1 dose.  30 tablet  99  . enalapril (VASOTEC) 20 MG tablet Take 20 mg by mouth daily.      Marland Kitchen glipiZIDE (GLUCOTROL) 5 MG tablet Take 5 mg by mouth 2 (two) times daily before a meal.      . Magnesium 250 MG TABS Take 250 mg by mouth daily.      . metFORMIN  (GLUCOPHAGE) 1000 MG tablet Take 1,000 mg by mouth 2 (two) times daily with a meal.      . metoCLOPramide (REGLAN) 10 MG tablet Take 1 tablet (10 mg total) by mouth at bedtime.  90 tablet  99  . metoprolol succinate (TOPROL-XL) 100 MG 24 hr tablet Take 1 tablet (100 mg total) by mouth daily. Take with or immediately following a meal.  90 tablet  3  . montelukast (SINGULAIR) 10 MG tablet Take 10 mg by mouth at bedtime.      . nitroGLYCERIN (NITROSTAT) 0.4 MG SL tablet Place 1 tablet (0.4 mg total) under the tongue every 5 (five) minutes x 3 doses as needed for chest pain.  25 tablet  0  . Omega-3 Fatty Acids (FISH OIL) 1000 MG CAPS Take 1 capsule (1,000 mg total) by mouth 2 (two) times daily.  360 capsule  0  . pantoprazole (PROTONIX) 40 MG tablet Take 40 mg by mouth daily.      . predniSONE (DELTASONE) 20 MG tablet Take one pill two times daily for 3 days, take one pill daily for 4 days.  10 tablet  0  . promethazine-codeine (PHENERGAN WITH CODEINE) 6.25-10 MG/5ML syrup Take 5 mLs by mouth every 6 (six) hours as needed for cough.  [place]herty  mL  0   No current facility-administered medications on file prior to visit.   Medical History:  Past Medical History  Diagnosis Date  . Diabetes mellitus without complication   . Hypertension   . GERD (gastroesophageal reflux disease)   . Chronic back pain   . CAD (coronary artery disease)    Allergies:  Allergies  Allergen Reactions  . Ibuprofen     GI upset  . Naproxen     GI upset     Review of Systems: [X]  = complains of  [ ]  = denies  General: Fatigue [ ]  Fever [ ]  Chills [ ]  Weakness [ ]   Insomnia [ ]  Eyes: Redness [ ]  Blurred vision [ ]  Diplopia [ ]   ENT: Congestion [ X] Sinus Pain [ X] Post Nasal Drip Valu.Nieves ] Sore Throat [ ]  Earache [ ]   Cardiac: Chest pain/pressure [ ]  SOB Valu.Nieves ] Orthopnea [ ]   Palpitations [ ]   Paroxysmal nocturnal dyspnea[ ]  Claudication [ ]  Edema [ ]   Pulmonary: Cough [ ]  Wheezing[ ]   SOB [ ]   Snoring [ ]   GI: Nausea [ ]   Vomiting[ ]  Dysphagia[ ]  Heartburn[ ]  Abdominal pain [ ]  Constipation [ ] ; Diarrhea [ ] ; BRBPR [ ]  Melena[ ]  GU: Hematuria[ ]  Dysuria [ ]  Nocturia[ ]  Urgency [ ]   Hesitancy [ ]  Discharge [ ]  Neuro: Headaches[ ]  Vertigo[ ]  Paresthesias[ ]  Spasm [ ]  Speech changes [ ]  Incoordination [ ]   Ortho: Arthritis [ ]  Joint pain [ ]  Muscle pain [ ]  Joint swelling [ ]  Back Pain [ ]  Skin:  Rash [ ]   Pruritis [ ]  Change in skin lesion [ ]   Psych: Depression[ ]  Anxiety[ ]  Confusion [ ]  Memory loss [ ]   Heme/Lypmh: Bleeding [ ]  Bruising [ ]  Enlarged lymph nodes [ ]   Endocrine: Visual blurring [ ]  Paresthesia [ ]  Polyuria [ ]  Polydypsea [ ]    Heat/cold intolerance [ ]  Hypoglycemia [ ]   Family history- Review and unchanged Social history- Review and unchanged Physical Exam: Filed Vitals:   11/13/13 1601  BP: 100/54  Pulse: 68  Temp: 99.1 F (37.3 C)  Resp: 14   Wt Readings from Last 3 Encounters:  11/13/13 195 lb (88.451 kg)  11/06/13 194 lb (87.998 kg)  10/11/13 198 lb (89.812 kg)   General Appearance: Well nourished, in no apparent distress. Eyes: PERRLA, EOMs, conjunctiva no swelling or erythema Sinuses: No Frontal/maxillary tenderness ENT/Mouth: Ext aud canals clear, TMs without erythema, bulging. No erythema, swelling, or exudate on post pharynx.  Tonsils not swollen or erythematous. Hearing normal.  Neck: Supple, thyroid normal.  Respiratory: Respiratory effort normal, BS equal bilaterally without rales, rhonchi, wheezing or stridor.  Cardio: RRR with no MRGs. Brisk peripheral pulses without edema.  Abdomen: Soft, + BS.  Non tender, no guarding, rebound, hernias, masses. Lymphatics: Non tender without lymphadenopathy.  Musculoskeletal: Full ROM, 5/5 strength, normal gait.  Skin: Warm, dry without rashes, lesions, ecchymosis.  Neuro: Cranial nerves intact. No cerebellar symptoms. Sensation intact.  Psych: Awake and oriented X 3, normal affect, Insight and Judgment appropriate.   Assessment  and Plan:  Hypertension: Continue medication, monitor blood pressure at home.  Continue DASH diet. Cholesterol: Continue diet and exercise. Check cholesterol.  Diabetes-Continue diet and exercise. Check A1C Vitamin D Def- check level and continue medications.  Sinusitis- levaquin, check CBC, astelin/nasonex SOB with exertion- just seen cardiologist, okay, we discussed getting PFTs, decreased exercise intolerance, and getting CXR/CT scan, will follow up  in 4 weeks.   Follow up in 4 weeks.  Continue diet and meds as discussed. Further disposition pending results of labs. Discussed med's effects and SE's.    Vicie Mutters 4:06 PM

## 2013-11-13 NOTE — Patient Instructions (Addendum)
Take the astelin and the flonase at night, do not sniff when you spray and lay down right afterwards. Astelin is more for runny nose and can be done as needed. The flonase is more for stuffy nose and needs to be done on a regular basis.   If you breathing gets worse please go to the ER and if it does not get better in the spring/with walking i want to send you to a pulmonary doctor for PFTs and possible a CT scan.   The majority of colds are caused by viruses and do not require antibiotics. Please read the rest of this hand out to learn more about the common cold and what you can do to help yourself as well as help prevent the over use of antibiotics.   COMMON COLD SIGNS AND SYMPTOMS - The common cold usually causes nasal congestion, runny nose, and sneezing. A sore throat may be present on the first day but usually resolves quickly. If a cough occurs, it generally develops on about the fourth or fifth day of symptoms, typically when congestion and runny nose are resolving  COMMON COLD COMPLICATIONS - In most cases, colds do not cause serious illness or complications. Most colds last for three to seven days, although many people continue to have symptoms (coughing, sneezing, congestion) for up to two weeks.  One of the more common complications is sinusitis, which is usually caused by viruses and rarely (about 2 percent of the time) by bacteria. Having thick or yellow to green-colored nasal discharge does not mean that bacterial sinusitis has developed; discolored nasal discharge is a normal phase of the common cold.  Lower respiratory infections, such as pneumonia or bronchitis, may develop following a cold.  Infection of the middle ear, or otitis media, can accompany or follow a cold.  COMMON COLD TREATMENT - There is no specific treatment for the viruses that cause the common cold. Most treatments are aimed at relieving some of the symptoms of the cold, but do not shorten or cure the cold.  Antibiotics are not useful for treating the common cold; antibiotics are only used to treat illnesses caused by bacteria, not viruses. Unnecessary use of antibiotics for the treatment of the common cold can cause allergic reactions, diarrhea, or other gastrointestinal symptoms in some patients.  The symptoms of a cold will resolve over time, even without any treatment. People with underlying medical conditions and those who use other over-the-counter or prescription medications should speak with their healthcare provider or pharmacist to ensure that it is safe to use these treatments. The following are treatments that may reduce the symptoms caused by the common cold.  Nasal congestion - Decongestants are good for nasal congestion- if you feel very stuffy but no mucus is coming out, this is the medication that will help you the most.  Pseudoephedrine is a decongestant that can improve nasal congestion. Although a prescription is not required, drugstores in the [place] keep pseudoephedrine behind the counter, so it must be requested from a pharmacist. If you have a heart condition or high blood pressure please use Coricidin BPH instead.   Runny nose - Antihistamines such as diphenhydramine (Benadryl), certazine (Zyrtec) which are best taking at night because they can make you tired OR loratadine (Claritin),  fexafinadine (Allegra) help with a runny nose.   Nasal sprays such an oxymetazoline (Afrin and others) may also give temporary relief of nasal congestion. However, these sprays should never be used for more than two to  three days; use for more than three days use can worsen congestion.  Nasocort is now over the counter and can help decrease a runny nose. Please stop the medication if you have blurry vision or nose bleeds.   Sore throat and headache - Sore throat and headache are best treated with a mild pain reliever such as acetaminophen (Tylenol) or a non-steroidal anti-inflammatory agent such as  ibuprofen or naproxen (Motrin or Aleve). These medications should be taken with food to prevent stomach problems. As well as gargling with warm water and salt.   Cough - Common cough medicine ingredients include guaifenesin and dextromethorphan; these are often combined with other medications in over-the-counter cold formulas. Often a cough is worse at night or first in the morning due to post nasal drip from you nose. You can try to sleep at an angle to decrease a cough.   Alternative treatments - Heated, humidified air can improve symptoms of nasal congestion and runny nose, and causes few to no side effects. A number of alternative products, including vitamin C, doubling up on your vitamin D and herbal products such as echinacea, may help. Certain products, such as nasal gels that contain zinc (eg, Zicam), have been associated with a permanent loss of smell.  Antibiotics - Antibiotics should not be used to treat an uncomplicated common cold. As noted above, colds are caused by viruses. Antibiotics treat bacterial, not viral infections. Some viruses that cause the common cold can also depress the immune system or cause swelling in the lining of the nose or airways; this can, in turn, lead to a bacterial infection. Often you need to give your body 7 days to fight off a common cold while treating the symptoms with the medications listed above. If after 7 days your symptoms are not improving, you are getting worse, you have shortness of breath, chest pain, a fever of over 103 you should seek medical help immediately.   PREVENTION IS THE BEST MEDICINE - Hand washing is an essential and highly effective way to prevent the spread of infection.  Alcohol-based hand rubs are a good alternative for disinfecting hands if a sink is not available.  Hands should be washed before preparing food and eating and after coughing, blowing the nose, or sneezing. While it is not always possible to limit contact with people  who may be infected with a cold, touching the eyes, nose, or mouth after direct contact should be avoided when possible. Sneezing/coughing into the sleeve of one's clothing (at the inner elbow) is another means of containing sprays of saliva and secretions and does not contaminate the hands.      Bad carbs also include fruit juice, alcohol, and sweet tea. These are empty calories that do not signal to your brain that you are full.   Please remember the good carbs are still carbs which convert into sugar. So please measure them out no more than 1/2-1 cup of rice, oatmeal, pasta, and beans.  Veggies are however free foods! Pile them on.   I like lean protein at every meal such as chicken, [place], pork chops, cottage cheese, etc. Just do not fry these meats and please center your meal around vegetable, the meats should be a side dish.   No all fruit is created equal. Please see the list below, the fruit at the bottom is higher in sugars than the fruit at the top

## 2013-11-14 LAB — BASIC METABOLIC PANEL WITH GFR
BUN: 15 mg/dL (ref 6–23)
CALCIUM: 9.7 mg/dL (ref 8.4–10.5)
CO2: 23 meq/L (ref 19–32)
CREATININE: 0.95 mg/dL (ref 0.50–1.35)
Chloride: 105 mEq/L (ref 96–112)
GFR, EST NON AFRICAN AMERICAN: 83 mL/min
GFR, Est African American: >89 mL/min
Glucose, Bld: 237 mg/dL — ABNORMAL HIGH (ref 70–99)
Potassium: 4.4 mEq/L (ref 3.5–5.3)
Sodium: 140 mEq/L (ref 135–145)

## 2013-11-14 LAB — HEPATIC FUNCTION PANEL
ALBUMIN: 4.5 g/dL (ref 3.5–5.2)
ALT: 20 U/L (ref 0–53)
AST: 17 U/L (ref 0–37)
Alkaline Phosphatase: 67 U/L (ref 39–117)
Bilirubin, Direct: 0.1 mg/dL (ref 0.0–0.3)
Indirect Bilirubin: 0.2 mg/dL (ref 0.2–1.2)
TOTAL PROTEIN: 6.6 g/dL (ref 6.0–8.3)
Total Bilirubin: 0.3 mg/dL (ref 0.2–1.2)

## 2013-11-14 LAB — HEMOGLOBIN A1C
Hgb A1c MFr Bld: 8.9 % — ABNORMAL HIGH (ref <5.7)
Mean Plasma Glucose: 209 mg/dL — ABNORMAL HIGH (ref <117)

## 2013-11-14 LAB — MAGNESIUM: Magnesium: 1.9 mg/dL (ref 1.5–2.5)

## 2013-11-14 LAB — VITAMIN D 25 HYDROXY (VIT D DEFICIENCY, FRACTURES): Vit D, 25-Hydroxy: 74 ng/mL (ref 30–89)

## 2013-11-14 LAB — TSH: TSH: 0.194 u[IU]/mL — ABNORMAL LOW (ref 0.350–4.500)

## 2013-11-14 LAB — INSULIN, FASTING: Insulin fasting, serum: 53 u[IU]/mL — ABNORMAL HIGH (ref 3–28)

## 2013-11-26 ENCOUNTER — Emergency Department (HOSPITAL_COMMUNITY): Payer: BC Managed Care – PPO

## 2013-11-26 ENCOUNTER — Encounter (HOSPITAL_COMMUNITY): Payer: Self-pay | Admitting: Emergency Medicine

## 2013-11-26 ENCOUNTER — Emergency Department (HOSPITAL_COMMUNITY)
Admission: EM | Admit: 2013-11-26 | Discharge: 2013-11-26 | Disposition: A | Discharge status: Home / Self Care | Payer: BC Managed Care – PPO | Attending: Emergency Medicine | Admitting: Emergency Medicine

## 2013-11-26 DIAGNOSIS — Y929 Unspecified place or not applicable: Secondary | ICD-10-CM | POA: Insufficient documentation

## 2013-11-26 DIAGNOSIS — S61209A Unspecified open wound of unspecified finger without damage to nail, initial encounter: Secondary | ICD-10-CM | POA: Insufficient documentation

## 2013-11-26 DIAGNOSIS — Z79899 Other long term (current) drug therapy: Secondary | ICD-10-CM | POA: Insufficient documentation

## 2013-11-26 DIAGNOSIS — G8929 Other chronic pain: Secondary | ICD-10-CM | POA: Insufficient documentation

## 2013-11-26 DIAGNOSIS — IMO0002 Reserved for concepts with insufficient information to code with codable children: Secondary | ICD-10-CM | POA: Insufficient documentation

## 2013-11-26 DIAGNOSIS — Z7982 Long term (current) use of aspirin: Secondary | ICD-10-CM | POA: Insufficient documentation

## 2013-11-26 DIAGNOSIS — Y9389 Activity, other specified: Secondary | ICD-10-CM | POA: Insufficient documentation

## 2013-11-26 DIAGNOSIS — I2510 Atherosclerotic heart disease of native coronary artery without angina pectoris: Secondary | ICD-10-CM | POA: Insufficient documentation

## 2013-11-26 DIAGNOSIS — K219 Gastro-esophageal reflux disease without esophagitis: Secondary | ICD-10-CM | POA: Insufficient documentation

## 2013-11-26 DIAGNOSIS — I10 Essential (primary) hypertension: Secondary | ICD-10-CM | POA: Insufficient documentation

## 2013-11-26 DIAGNOSIS — E119 Type 2 diabetes mellitus without complications: Secondary | ICD-10-CM | POA: Insufficient documentation

## 2013-11-26 DIAGNOSIS — W268XXA Contact with other sharp object(s), not elsewhere classified, initial encounter: Secondary | ICD-10-CM | POA: Insufficient documentation

## 2013-11-26 DIAGNOSIS — S6991XA Unspecified injury of right wrist, hand and finger(s), initial encounter: Secondary | ICD-10-CM

## 2013-11-26 DIAGNOSIS — Z23 Encounter for immunization: Secondary | ICD-10-CM | POA: Insufficient documentation

## 2013-11-26 DIAGNOSIS — Z87891 Personal history of nicotine dependence: Secondary | ICD-10-CM | POA: Insufficient documentation

## 2013-11-26 MED ORDER — HYDROCODONE-ACETAMINOPHEN 5-325 MG PO TABS
1.0000 | ORAL_TABLET | Freq: Four times a day (QID) | ORAL | Status: DC | PRN
Start: 2013-11-26 — End: 2014-01-04

## 2013-11-26 MED ORDER — TETANUS-DIPHTH-ACELL PERTUSSIS 5-2.5-18.5 LF-MCG/0.5 IM SUSP
0.5000 mL | Freq: Once | INTRAMUSCULAR | Status: AC
  Administered 2013-11-26: 0.5 mL via INTRAMUSCULAR
  Filled 2013-11-26: qty 0.5

## 2013-11-26 MED ORDER — CEPHALEXIN 500 MG PO CAPS: 500.0000 mg | ORAL_CAPSULE | Freq: Four times a day (QID) | ORAL | Status: DC

## 2013-11-26 MED ORDER — OXYCODONE-ACETAMINOPHEN 5-325 MG PO TABS
1.0000 | ORAL_TABLET | Freq: Once | ORAL | Status: AC
  Administered 2013-11-26: 1 via ORAL
  Filled 2013-11-26: qty 1

## 2013-11-26 NOTE — ED Notes (Signed)
Pt has fish hook in right thumb, unknown last tetanus.

## 2013-11-26 NOTE — ED Provider Notes (Signed)
Medical screening examination/treatment/procedure(s) were performed by non-physician practitioner and as supervising physician I was immediately available for consultation/collaboration.   EKG Interpretation None        Louay B. Karle Starch, MD 11/26/13 2351

## 2013-11-26 NOTE — ED Notes (Signed)
Pt to xray

## 2013-11-26 NOTE — ED Provider Notes (Signed)
CSN: [id number]     Arrival date & time 11/26/13  1750 History  This chart was scribed for non-physician practitioner, Domenic Moras, PA-C working with Mercie Eon. Karle Starch, MD by Frederich Balding, ED scribe. This patient was seen in room TR06C/TR06C and the patient's care was started at 6:07 PM.   Chief Complaint  Patient presents with  . Hand Injury   The history is provided by the patient. No language interpreter was used.   HPI Comments: CHAMP KEETCH is a 68 y.o. Male with PMH DM, HTN, and CAD, who presents to the Emergency Department complaining of a puncture wound to right thumb. He states that he was pulling fishing poles out of his truck this afternoon when he suffered a fishing hook that imbedded in the medial, volar surface of right thumb. He tried to pull it out himself without success. He states that this has happened before with the last occurrence being 2 years ago. He has not taken anything for pain nor has he seen anyone else for it. He denies any chest pain, shortness of breath, or recent fever/illness.  Does not recall last tetanus shot.  Denies numbness.  Past Medical History  Diagnosis Date  . Diabetes mellitus without complication   . Hypertension   . GERD (gastroesophageal reflux disease)   . Chronic back pain   . CAD (coronary artery disease)    Past Surgical History  Procedure Laterality Date  . Appendectomy    . Cardiac surgery      3 stent placed.   Family History  Problem Relation Age of Onset  . Diabetes Sister    History  Substance Use Topics  . Smoking status: Former Smoker -- 50 years    Quit date: 07/27/2011  . Smokeless tobacco: Never Used  . Alcohol Use: No    Review of Systems  Constitutional: Negative for fever and chills.  Respiratory: Negative for shortness of breath.   Cardiovascular: Negative for chest pain.  Gastrointestinal: Negative for nausea and vomiting.  Skin: Positive for wound.  All other systems reviewed and are  negative.   Allergies  Ibuprofen and Naproxen  Home Medications   Current Outpatient Rx  Name  Route  Sig  Dispense  Refill  . aspirin 81 MG tablet   Oral   Take 81 mg by mouth daily.         Marland Kitchen atorvastatin (LIPITOR) 40 MG tablet   Oral   Take 0.5 tablets (20 mg total) by mouth daily.   30 tablet   3   . azelastine (ASTELIN) 137 MCG/SPRAY nasal spray   Each Nare   Place 2 sprays into both nostrils 2 (two) times daily. Use in each nostril as directed   30 mL   2   . baclofen (LIORESAL) 10 MG tablet   Oral   Take 1 tablet (10 mg total) by mouth 2 (two) times daily.   60 tablet   1   . Canagliflozin (INVOKANA) 300 MG TABS   Oral   Take 0.5 tablets (150 mg total) by mouth daily.   30 tablet   99   . Cholecalciferol (GNP VITAMIN D SUPER STRENGTH) 5000 UNITS TABS   Oral   Take 1 tablet (5,000 Units total) by mouth 1 day or 1 dose.   30 tablet   99   . enalapril (VASOTEC) 20 MG tablet   Oral   Take 20 mg by mouth daily.         Marland Kitchen  fluticasone (FLONASE) 50 MCG/ACT nasal spray   Each Nare   Place 1 spray into both nostrils at bedtime.   16 g   2   . glipiZIDE (GLUCOTROL) 5 MG tablet   Oral   Take 5 mg by mouth 2 (two) times daily before a meal.         . Magnesium 250 MG TABS   Oral   Take 250 mg by mouth daily.         . metFORMIN (GLUCOPHAGE) 1000 MG tablet   Oral   Take 1,000 mg by mouth 2 (two) times daily with a meal.         . metoCLOPramide (REGLAN) 10 MG tablet   Oral   Take 1 tablet (10 mg total) by mouth at bedtime.   90 tablet   99   . metoprolol succinate (TOPROL-XL) 100 MG 24 hr tablet   Oral   Take 1 tablet (100 mg total) by mouth daily. Take with or immediately following a meal.   90 tablet   3   . montelukast (SINGULAIR) 10 MG tablet   Oral   Take 10 mg by mouth at bedtime.         . nitroGLYCERIN (NITROSTAT) 0.4 MG SL tablet   Sublingual   Place 1 tablet (0.4 mg total) under the tongue every 5 (five) minutes x 3  doses as needed for chest pain.   25 tablet   0   . Omega-3 Fatty Acids (FISH OIL) 1000 MG CAPS   Oral   Take 1 capsule (1,000 mg total) by mouth 2 (two) times daily.   360 capsule   0   . pantoprazole (PROTONIX) 40 MG tablet   Oral   Take 40 mg by mouth daily.          BP 105/79  Pulse 68  Temp(Src) 98.9 F (37.2 C) (Oral)  Resp 18  Wt 196 lb (88.905 kg)  SpO2 100%  Physical Exam  Nursing note and vitals reviewed. Constitutional: He is oriented to person, place, and time. He appears well-developed and well-nourished. No distress.  HENT:  Head: Normocephalic and atraumatic.  Eyes: EOM are normal.  Neck: Neck supple. No tracheal deviation present.  Cardiovascular: Normal rate, regular rhythm and normal heart sounds.   Pulmonary/Chest: Effort normal and breath sounds normal. No respiratory distress.  Musculoskeletal: Normal range of motion.  Neurological: He is alert and oriented to person, place, and time.  Skin: Skin is warm and dry.  Puncture wound from a fishing hook approximately 1.5 cm long imbedded in the medial, volar surface of right thumb.  No surrounding erythema or signs of infection  Psychiatric: He has a normal mood and affect. His behavior is normal.    ED Course  FOREIGN BODY REMOVAL Date/Time: 11/26/2013 9:12 PM Performed by: Domenic Moras Authorized by: Domenic Moras Consent: Verbal consent obtained. Risks and benefits: risks, benefits and alternatives were discussed Consent given by: patient Patient understanding: patient states understanding of the procedure being performed Imaging studies: imaging studies available Patient identity confirmed: verbally with patient and arm band Time out: Immediately prior to procedure a "time out" was called to verify the correct patient, procedure, equipment, support staff and site/side marked as required. Intake: R thumb. Anesthesia: digital block Local anesthetic: lidocaine 2% without epinephrine Anesthetic  total: 5 ml Patient sedated: no Patient restrained: no Patient cooperative: yes Complexity: simple 1 objects recovered. Objects recovered: fishing hook Post-procedure assessment: foreign body removed Patient tolerance:  Patient tolerated the procedure well with no immediate complications.   (including critical care time)  DIAGNOSTIC STUDIES: Oxygen Saturation is 100% on RA, normal by my interpretation.    9:11 PM Pt with embedded fishing hook.  Xray demonstrate no bony or joint involvement.  Digital block and were given and hook were removed with gentle retraction without any complication.  Pt tolerates well      Labs Review Labs Reviewed - No data to display Imaging Review Dg Finger Thumb Right  11/26/2013   CLINICAL DATA:  Pain post trauma  EXAM: RIGHT THUMB 2+V  COMPARISON:  None.  FINDINGS: Frontal, oblique, and lateral views were obtained. There is a curved metallic foreign body in the soft tissues volar and slightly lateral to the first IP joint. There is moderate osteoarthritic change in the first MCP joint. There is mild osteoarthritic change in the first IP joint. No fracture or dislocation. No erosive change.  IMPRESSION: Portion of fish hook in soft tissues volar and slightly lateral to the first IP joint. There is osteoarthritic change in the first MCP and IP joints. No fracture or dislocation.   Electronically Signed   By: Lowella Grip M.D.   On: 11/26/2013 20:46     EKG Interpretation None      MDM   Final diagnoses:  Fish hook injury of right thumb    BP 105/79  Pulse 68  Temp(Src) 98.9 F (37.2 C) (Oral)  Resp 18  Wt 196 lb (88.905 kg)  SpO2 100%  I have reviewed nursing notes and vital signs. I personally reviewed the imaging tests through PACS system  I reviewed available ER/hospitalization records thought the EMR   I personally performed the services described in this documentation, which was scribed in my presence. The recorded information  has been reviewed and is accurate.    Domenic Moras, PA-C 11/26/13 2117

## 2013-11-26 NOTE — ED Notes (Signed)
Onset 1 hour ago fishhook in right thumb.  No bleeding.

## 2013-11-26 NOTE — Discharge Instructions (Signed)
A fish hook was removed from your thumb.  Please return if you notice signs in infection including increasing pain, redness, pus drainage or if you have other concerns.

## 2013-12-18 ENCOUNTER — Ambulatory Visit: Payer: Self-pay | Admitting: Physician Assistant

## 2013-12-20 ENCOUNTER — Ambulatory Visit (INDEPENDENT_AMBULATORY_CARE_PROVIDER_SITE_OTHER): Payer: BC Managed Care – PPO | Admitting: Physician Assistant

## 2013-12-20 ENCOUNTER — Encounter: Payer: Self-pay | Admitting: Physician Assistant

## 2013-12-20 VITALS — BP 110/62 | HR 60 | Temp 97.9°F | Resp 16 | Wt 196.0 lb

## 2013-12-20 DIAGNOSIS — I10 Essential (primary) hypertension: Secondary | ICD-10-CM

## 2013-12-20 DIAGNOSIS — R5381 Other malaise: Secondary | ICD-10-CM

## 2013-12-20 DIAGNOSIS — R5383 Other fatigue: Secondary | ICD-10-CM

## 2013-12-20 NOTE — Progress Notes (Signed)
Subjective:    Patient ID: Michael Guzman, male    DOB: [birth year], 68 y.o.   MRN: [id number]  HPI 68 y.o. with history of poorly controlled DM, CAD, HTN presents with fatigue and SOB with exertion. Last visit he was having increased SOB with exertion and it is still there but he has more fatigue, hits him around 3-4 pm, he states he "spirals" downward. In the morning he can work in the yard without SOB, fatigue if he does it at a normal pace but if he works quickly he has extreme fatigue.   Current Outpatient Prescriptions on File Prior to Visit  Medication Sig Dispense Refill  . aspirin 81 MG tablet Take 81 mg by mouth daily.      Marland Kitchen atorvastatin (LIPITOR) 40 MG tablet Take 0.5 tablets (20 mg total) by mouth daily.  30 tablet  3  . azelastine (ASTELIN) 137 MCG/SPRAY nasal spray Place 2 sprays into both nostrils 2 (two) times daily. Use in each nostril as directed  30 mL  2  . baclofen (LIORESAL) 10 MG tablet Take 1 tablet (10 mg total) by mouth 2 (two) times daily.  60 tablet  1  . Canagliflozin (INVOKANA) 300 MG TABS Take 0.5 tablets (150 mg total) by mouth daily.  30 tablet  99  . cephALEXin (KEFLEX) 500 MG capsule Take 1 capsule (500 mg total) by mouth 4 (four) times daily.  28 capsule  0  . Cholecalciferol (GNP VITAMIN D SUPER STRENGTH) 5000 UNITS TABS Take 1 tablet (5,000 Units total) by mouth 1 day or 1 dose.  30 tablet  99  . enalapril (VASOTEC) 20 MG tablet Take 20 mg by mouth daily.      . fluticasone (FLONASE) 50 MCG/ACT nasal spray Place 1 spray into both nostrils at bedtime.  16 g  2  . glipiZIDE (GLUCOTROL) 5 MG tablet Take 5 mg by mouth 2 (two) times daily before a meal.      . HYDROcodone-acetaminophen (NORCO/VICODIN) 5-325 MG per tablet Take 1 tablet by mouth every 6 (six) hours as needed for severe pain.  10 tablet  0  . Magnesium 250 MG TABS Take 250 mg by mouth daily.      . metFORMIN (GLUCOPHAGE) 1000 MG tablet Take 1,000 mg by mouth 2 (two) times daily with a meal.       . metoCLOPramide (REGLAN) 10 MG tablet Take 1 tablet (10 mg total) by mouth at bedtime.  90 tablet  99  . metoprolol succinate (TOPROL-XL) 100 MG 24 hr tablet Take 1 tablet (100 mg total) by mouth daily. Take with or immediately following a meal.  90 tablet  3  . montelukast (SINGULAIR) 10 MG tablet Take 10 mg by mouth at bedtime.      . nitroGLYCERIN (NITROSTAT) 0.4 MG SL tablet Place 1 tablet (0.4 mg total) under the tongue every 5 (five) minutes x 3 doses as needed for chest pain.  25 tablet  0  . Omega-3 Fatty Acids (FISH OIL) 1000 MG CAPS Take 1 capsule (1,000 mg total) by mouth 2 (two) times daily.  360 capsule  0  . pantoprazole (PROTONIX) 40 MG tablet Take 40 mg by mouth daily.       No current facility-administered medications on file prior to visit.    Review of Systems  Constitutional: Positive for fatigue. Negative for fever, chills, diaphoresis, activity change, appetite change and unexpected weight change.  HENT: Negative.   Eyes: Negative.   Respiratory:  Negative.   Cardiovascular: Negative.   Gastrointestinal: Negative.   Genitourinary: Negative.   Musculoskeletal: Negative.   Neurological: Negative.   Hematological: Negative.        Objective:   Physical Exam Blood pressure 110/62, pulse 60, temperature 97.9 F (36.6 C), resp. rate 16, weight 196 lb (88.905 kg). General appearance: alert and fatigued Eyes: conjunctivae/corneas clear. PERRL, EOM's intact. Fundi benign. Ears: normal TM's and external ear canals both ears Nose: Nares normal. Septum midline. Mucosa normal. No drainage or sinus tenderness. Throat: lips, mucosa, and tongue normal; teeth and gums normal Neck: no adenopathy, no carotid bruit, no JVD, supple, symmetrical, trachea midline and thyroid not enlarged, symmetric, no tenderness/mass/nodules Lungs: clear to auscultation bilaterally Heart: regular rate and rhythm, S1, S2 normal, no murmur, click, rub or gallop Abdomen: soft, non-tender; bowel  sounds normal; no masses,  no organomegaly Extremities: extremities normal, atraumatic, no cyanosis or edema     Assessment & Plan:  Fatigue- he is following up with his cardiologist, currently at this time he states he is feeling well and he does not want to try any medications, take any tests, and he seems very frustrated. We will see him back in 2 month, he will follow up with his cardiologist. I do suggest a CXR and better control of his sugars.

## 2013-12-27 ENCOUNTER — Encounter: Payer: Self-pay | Admitting: Cardiology

## 2013-12-28 ENCOUNTER — Other Ambulatory Visit: Payer: Self-pay | Admitting: *Deleted

## 2013-12-28 MED ORDER — METOPROLOL SUCCINATE ER 100 MG PO TB24: 100.0000 mg | ORAL_TABLET | Freq: Every day | ORAL | Status: DC

## 2014-01-04 ENCOUNTER — Ambulatory Visit (INDEPENDENT_AMBULATORY_CARE_PROVIDER_SITE_OTHER): Payer: BC Managed Care – PPO | Admitting: Cardiology

## 2014-01-04 ENCOUNTER — Encounter: Payer: Self-pay | Admitting: Cardiology

## 2014-01-04 VITALS — BP 124/80 | HR 71 | Ht 70.0 in | Wt 198.0 lb

## 2014-01-04 DIAGNOSIS — I2510 Atherosclerotic heart disease of native coronary artery without angina pectoris: Secondary | ICD-10-CM

## 2014-01-04 DIAGNOSIS — Z9861 Coronary angioplasty status: Secondary | ICD-10-CM

## 2014-01-04 DIAGNOSIS — I10 Essential (primary) hypertension: Secondary | ICD-10-CM

## 2014-01-04 DIAGNOSIS — E785 Hyperlipidemia, unspecified: Secondary | ICD-10-CM

## 2014-01-04 NOTE — Progress Notes (Signed)
[facility] [address], [address] [place], [place]  [address] Phone: [phone] Fax:  [phone]  Date:  01/04/2014   ID:  Michael Guzman, Michael Guzman 10/23/1945, MRN [id number]  PCP:  Alesia Richards, MD   History of Present Illness: JERMAL DISMUKE is a 68 y.o. male with coronary artery disease 10/13 drug eluting stent to mid LAD and OM2, normal ejection fraction with recurrent chest discomfort, repeat catheterization reassuring.  Overall doing well. Considering retiring. [place]..   Patient denies palpitations, dizziness, syncope, swelling SOB, , nor PND. Left lower quadrant pain. Colonoscopy normal.  Happens with fried food. Radiated to left chest wall.   As long as he keeps bowels loose, dosen't bother him.   Weather been bad, feels worn out at times. Left abdominal pain, bubble like when eating. Occasionally will start in his left upper abdomen and radiates to his left shoulder. He has had workup for this.    Wt Readings from Last 3 Encounters:  01/04/14 198 lb (89.812 kg)  12/20/13 196 lb (88.905 kg)  11/26/13 196 lb (88.905 kg)     Past Medical History  Diagnosis Date  . Diabetes mellitus without complication   . Hypertension   . GERD (gastroesophageal reflux disease)   . Chronic back pain   . CAD (coronary artery disease)     Past Surgical History  Procedure Laterality Date  . Appendectomy    . Cardiac surgery      3 stent placed.    Current Outpatient Prescriptions  Medication Sig Dispense Refill  . aspirin 81 MG tablet Take 81 mg by mouth daily.      Marland Kitchen atorvastatin (LIPITOR) 40 MG tablet Take 0.5 tablets (20 mg total) by mouth daily.  30 tablet  3  . azelastine (ASTELIN) 137 MCG/SPRAY nasal spray Place 2 sprays into both nostrils 2 (two) times daily. Use in each nostril as directed  30 mL  2  . Canagliflozin (INVOKANA) 300 MG TABS Take 0.5 tablets (150 mg total) by mouth daily.  30 tablet  99  . Cholecalciferol (GNP VITAMIN D SUPER STRENGTH)  5000 UNITS TABS Take 1 tablet (5,000 Units total) by mouth 1 day or 1 dose.  30 tablet  99  . enalapril (VASOTEC) 20 MG tablet Take 20 mg by mouth daily.      . fluticasone (FLONASE) 50 MCG/ACT nasal spray Place 1 spray into both nostrils at bedtime.  16 g  2  . glipiZIDE (GLUCOTROL) 5 MG tablet Take 5 mg by mouth 2 (two) times daily before a meal.      . Magnesium 250 MG TABS Take 250 mg by mouth daily.      . metFORMIN (GLUCOPHAGE) 1000 MG tablet Take 1,000 mg by mouth 2 (two) times daily with a meal.      . metoCLOPramide (REGLAN) 10 MG tablet Take 1 tablet (10 mg total) by mouth at bedtime.  90 tablet  99  . metoprolol succinate (TOPROL-XL) 100 MG 24 hr tablet Take 1 tablet (100 mg total) by mouth daily. Take with or immediately following a meal.  90 tablet  0  . montelukast (SINGULAIR) 10 MG tablet Take 10 mg by mouth at bedtime.      . nitroGLYCERIN (NITROSTAT) 0.4 MG SL tablet Place 1 tablet (0.4 mg total) under the tongue every 5 (five) minutes x 3 doses as needed for chest pain.  25 tablet  0  . Omega-3 Fatty Acids (FISH OIL) 1000  MG CAPS Take 1 capsule (1,000 mg total) by mouth 2 (two) times daily.  360 capsule  0  . pantoprazole (PROTONIX) 40 MG tablet Take 40 mg by mouth daily.       No current facility-administered medications for this visit.    Allergies:    Allergies  Allergen Reactions  . Ibuprofen     GI upset  . Naproxen     GI upset    Social History:  The patient  reports that he quit smoking about 2 years ago. He has never used smokeless tobacco. He reports that he does not drink alcohol or use illicit drugs.   ROS:  Please see the history of present illness.   Denies any fevers, chills, orthopnea, PND. He feels sinus congestion, run down at times.    PHYSICAL EXAM: VS:  BP 124/80  Pulse 71  Ht 5\' 10"  (1.778 m)  Wt 198 lb (89.812 kg)  BMI 28.41 kg/m2 Well nourished, well developed, in no acute distress HEENT: normal Neck: no JVD Cardiac:  normal S1, S2; RRR;  no murmur Lungs:  clear to auscultation bilaterally, no wheezing, rhonchi or rales Abd: soft, nontender, no hepatomegalyoverweight. Ext: no edema Skin: warm and dry Neuro: no focal abnormalities noted  EKG:  No EKG today.  ASSESSMENT AND PLAN:  1. Coronary artery disease-postintervention to LAD, OM. Doing well. No anginal symptoms.I will continue with ASA. No evidence of bleeding. 2. Abdominal discomfort-sounds like possible left transverse colonic distention that may be irritating his diaphragm causing radiation of pain into his left shoulder. As long as he keeps his bowels normal, he feels well. Continue with Metamucil/stool softener. Gastroenterology is been seeing him. 3. Hypertension- Bystolic was quite expensive for him. Tolerating metoprolol succinate 100 mg once a day well. 4. Sinus congestion-continuing to try Mucinex/Allegra. 5. Hyperlipidemia-LDL calculated is 30, direct is 45. I decreased his atorvastatin to 20 mg. Recheck lipid profile in one year. 6. Hypertension-good control. 7. I will see him back in 6 months.  Signed, Candee Furbish, MD [facility]  01/04/2014 5:15 PM

## 2014-01-04 NOTE — Patient Instructions (Signed)
Your physician wants you to follow-up in: 6 months  You will receive a reminder letter in the mail two months in advance. If you don't receive a letter, please call our office to schedule the follow-up appointment.  Your physician recommends that you continue on your current medications as directed. Please refer to the Current Medication list given to you today.  

## 2014-02-09 ENCOUNTER — Other Ambulatory Visit: Payer: Self-pay | Admitting: Orthopedic Surgery

## 2014-02-09 DIAGNOSIS — R531 Weakness: Secondary | ICD-10-CM

## 2014-02-09 DIAGNOSIS — R52 Pain, unspecified: Secondary | ICD-10-CM

## 2014-02-09 DIAGNOSIS — IMO0002 Reserved for concepts with insufficient information to code with codable children: Secondary | ICD-10-CM

## 2014-02-13 ENCOUNTER — Ambulatory Visit
Admission: RE | Admit: 2014-02-13 | Discharge: 2014-02-13 | Disposition: A | Discharge status: Home / Self Care | Payer: BC Managed Care – PPO | Source: Ambulatory Visit | Attending: Orthopedic Surgery | Admitting: Orthopedic Surgery

## 2014-02-13 DIAGNOSIS — R52 Pain, unspecified: Secondary | ICD-10-CM

## 2014-02-13 DIAGNOSIS — R531 Weakness: Secondary | ICD-10-CM

## 2014-02-13 DIAGNOSIS — IMO0002 Reserved for concepts with insufficient information to code with codable children: Secondary | ICD-10-CM

## 2014-02-14 ENCOUNTER — Other Ambulatory Visit: Payer: Self-pay | Admitting: Internal Medicine

## 2014-02-14 DIAGNOSIS — E1129 Type 2 diabetes mellitus with other diabetic kidney complication: Secondary | ICD-10-CM

## 2014-02-14 MED ORDER — CANAGLIFLOZIN 300 MG PO TABS: ORAL_TABLET | ORAL | Status: DC

## 2014-03-06 ENCOUNTER — Encounter: Payer: Self-pay | Admitting: Internal Medicine

## 2014-03-06 ENCOUNTER — Ambulatory Visit (INDEPENDENT_AMBULATORY_CARE_PROVIDER_SITE_OTHER): Payer: BC Managed Care – PPO | Admitting: Internal Medicine

## 2014-03-06 VITALS — BP 110/74 | HR 68 | Temp 98.1°F | Resp 16 | Ht 68.5 in | Wt 190.2 lb

## 2014-03-06 DIAGNOSIS — J019 Acute sinusitis, unspecified: Secondary | ICD-10-CM

## 2014-03-06 DIAGNOSIS — J042 Acute laryngotracheitis: Secondary | ICD-10-CM

## 2014-03-06 DIAGNOSIS — E559 Vitamin D deficiency, unspecified: Secondary | ICD-10-CM

## 2014-03-06 MED ORDER — HYDROCODONE-ACETAMINOPHEN 5-325 MG PO TABS: ORAL_TABLET | ORAL | Status: DC

## 2014-03-06 MED ORDER — LEVOFLOXACIN 500 MG PO TABS: 500.0000 mg | ORAL_TABLET | Freq: Every day | ORAL | Status: AC

## 2014-03-06 MED ORDER — PREDNISONE 10 MG PO TABS: ORAL_TABLET | ORAL | Status: DC

## 2014-03-06 NOTE — Patient Instructions (Signed)
Acute Bronchitis °Bronchitis is inflammation of the airways that extend from the windpipe into the lungs (bronchi). The inflammation often causes mucus to develop. This leads to a cough, which is the most common symptom of bronchitis.  °In acute bronchitis, the condition usually develops suddenly and goes away over time, usually in a couple weeks. Smoking, allergies, and asthma can make bronchitis worse. Repeated episodes of bronchitis may cause further lung problems.  °CAUSES °Acute bronchitis is most often caused by the same virus that causes a cold. The virus can spread from person to person (contagious).  °SIGNS AND SYMPTOMS  °· Cough.   °· Fever.   °· Coughing up mucus.   °· Body aches.   °· Chest congestion.   °· Chills.   °· Shortness of breath.   °· Sore throat.   °DIAGNOSIS  °Acute bronchitis is usually diagnosed through a physical exam. Tests, such as chest X-rays, are sometimes done to rule out other conditions.  °TREATMENT  °Acute bronchitis usually goes away in a couple weeks. Often times, no medical treatment is necessary. Medicines are sometimes given for relief of fever or cough. Antibiotics are usually not needed but may be prescribed in certain situations. In some cases, an inhaler may be recommended to help reduce shortness of breath and control the cough. A cool mist vaporizer may also be used to help thin bronchial secretions and make it easier to clear the chest.  °HOME CARE INSTRUCTIONS °· Get plenty of rest.   °· Drink enough fluids to keep your urine clear or pale yellow (unless you have a medical condition that requires fluid restriction). Increasing fluids may help thin your secretions and will prevent dehydration.   °· Only take over-the-counter or prescription medicines as directed by your health care provider.   °· Avoid smoking and secondhand smoke. Exposure to cigarette smoke or irritating chemicals will make bronchitis worse. If you are a smoker, consider using nicotine gum or skin  patches to help control withdrawal symptoms. Quitting smoking will help your lungs heal faster.   °· Reduce the chances of another bout of acute bronchitis by washing your hands frequently, avoiding people with cold symptoms, and trying not to touch your hands to your mouth, nose, or eyes.   °· Follow up with your health care provider as directed.   °SEEK MEDICAL CARE IF: °Your symptoms do not improve after 1 week of treatment.  °SEEK IMMEDIATE MEDICAL CARE IF: °· You develop an increased fever or chills.   °· You have chest pain.   °· You have severe shortness of breath. °· You have bloody sputum.   °· You develop dehydration. °· You develop fainting. °· You develop repeated vomiting. °· You develop a severe headache. °MAKE SURE YOU:  °· Understand these instructions. °· Will watch your condition. °· Will get help right away if you are not doing well or get worse. °Document Released: 10/08/2004 Document Revised: 05/03/2013 Document Reviewed: 02/21/2013 °[employer]® Patient Information ©2015 [employer]. This information is not intended to replace advice given to you by your health care provider. Make sure you discuss any questions you have with your health care provider. ° °

## 2014-03-06 NOTE — Progress Notes (Signed)
Subjective:    Patient ID: Michael Guzman, male    DOB: Jun 02, 1946, 68 y.o.   MRN: [id number]  Cough The current episode started in the past 7 days. The problem has been waxing and waning. The problem occurs every few minutes. Associated symptoms include ear congestion, a fever, headaches, nasal congestion, postnasal drip, a sore throat, sweats and wheezing. Pertinent negatives include no chest pain, chills, ear pain, heartburn, hemoptysis, myalgias, rash, rhinorrhea or shortness of breath. The symptoms are aggravated by dust and fumes. Treatments tried: Nyquil. The treatment provided no relief. His past medical history is significant for environmental allergies. There is no history of asthma, bronchiectasis, bronchitis, COPD, emphysema or pneumonia.  Sinusitis Associated symptoms include congestion, coughing, headaches, sinus pressure and a sore throat. Pertinent negatives include no chills, ear pain or shortness of breath.   Medication List   aspirin 81 MG tablet  Take 81 mg by mouth daily.     atorvastatin 40 MG tablet  Commonly known as:  LIPITOR  Take 0.5 tablets (20 mg total) by mouth daily.     azelastine 0.1 % nasal spray  Commonly known as:  ASTELIN  Place 2 sprays into both nostrils 2 (two) times daily. Use in each nostril as directed     Canagliflozin 300 MG Tabs  Commonly known as:  INVOKANA  Take 1/2 to 1 tablet daily as directed for diabetes     Cholecalciferol 5000 UNITS Tabs  Commonly known as:  GNP VITAMIN D SUPER STRENGTH  Take 1 tablet (5,000 Units total) by mouth 1 day or 1 dose.     enalapril 20 MG tablet  Commonly known as:  VASOTEC  Take 20 mg by mouth daily.     Fish Oil 1000 MG Caps  Take 1 capsule (1,000 mg total) by mouth 2 (two) times daily.     fluticasone 50 MCG/ACT nasal spray  Commonly known as:  FLONASE  Place 1 spray into both nostrils at bedtime.     GAVILYTE-N WITH FLAVOR PACK 420 G solution  Generic drug:  polyethylene  glycol-electrolytes     glipiZIDE 5 MG tablet  Commonly known as:  GLUCOTROL  Take 5 mg by mouth 2 (two) times daily before a meal.     HYDROcodone-acetaminophen 5-325 MG per tablet - new today  Commonly known as:  NORCO  Take 1/2 to 1 tablet every 3-4 hours as need for cough or pain     levofloxacin 500 MG tablet - New today  Commonly known as:  LEVAQUIN  Take 1 tablet (500 mg total) by mouth daily. For infection     Magnesium 250 MG Tabs  Take 250 mg by mouth daily.     metFORMIN 500 MG tablet  Commonly known as:  GLUCOPHAGE  Take 500 mg by mouth 2 (two) times daily with a meal.     metoCLOPramide 10 MG tablet  Commonly known as:  REGLAN  Take 1 tablet (10 mg total) by mouth at bedtime.     metoprolol succinate 100 MG 24 hr tablet  Commonly known as:  TOPROL-XL  Take 1 tablet (100 mg total) by mouth daily. Take with or immediately following a meal.     montelukast 10 MG tablet  Commonly known as:  SINGULAIR  Take 10 mg by mouth at bedtime.     nitroGLYCERIN 0.4 MG SL tablet  Commonly known as:  NITROSTAT  Place 1 tablet (0.4 mg total) under the tongue every 5 (five) minutes  x 3 doses as needed for chest pain.     pantoprazole 40 MG tablet  Commonly known as:  PROTONIX  Take 40 mg by mouth daily.     predniSONE 10 MG tablet- new totay  Commonly known as:  DELTASONE  1 tab 3 x day for 3 days, then 1 tab 2 x day for 3 days, then 1 tab 1 x day for 5 days     Allergies  Allergen Reactions  . Ibuprofen     GI upset  . Naproxen     GI upset   Past Medical History  Diagnosis Date  . Diabetes mellitus without complication   . Hypertension   . GERD (gastroesophageal reflux disease)   . Chronic back pain   . CAD (coronary artery disease)    Review of Systems  Constitutional: Positive for fever. Negative for chills.  HENT: Positive for congestion, postnasal drip, sinus pressure and sore throat. Negative for ear pain and rhinorrhea.   Eyes: Negative.    Respiratory: Positive for cough and wheezing. Negative for hemoptysis, choking, chest tightness, shortness of breath and stridor.   Cardiovascular: Negative for chest pain, palpitations and leg swelling.  Gastrointestinal: Negative.  Negative for heartburn.  Endocrine: Negative.   Genitourinary: Negative.   Musculoskeletal: Negative for myalgias.  Skin: Negative for rash.  Allergic/Immunologic: Positive for environmental allergies.  Neurological: Positive for headaches.  Hematological: Negative.   Psychiatric/Behavioral: Negative.    BP 110/74  P 68  T 98.1 F  R 16  Ht 5' 8.5"   Wt 190 lb 3.2 oz   BMI 28.50 kg/m2 Objective:   Physical Exam  Constitutional: He is oriented to person, place, and time.  HENT:  Mouth/Throat: Oropharynx is clear and moist. No oropharyngeal exudate.  Posterior pharynx 2+ red w/o exudate  Eyes: EOM are normal. Pupils are equal, round, and reactive to light. Right eye exhibits no discharge. Left eye exhibits no discharge.  Neck: Normal range of motion. Neck supple. No JVD present. No thyromegaly present.  Cardiovascular: Normal rate, regular rhythm and normal heart sounds.   No murmur heard. Pulmonary/Chest: No respiratory distress. He has wheezes. He has rales.  Abdominal: Soft. Bowel sounds are normal.  Lymphadenopathy:    He has no cervical adenopathy.  Neurological: He is alert and oriented to person, place, and time. No cranial nerve deficit. Coordination normal.  Skin: Skin is warm. No rash noted. He is diaphoretic. No erythema. No pallor.   Assessment & Plan:   1. Acute laryngotracheitis without mention of obstruction - Rx Levaquin 500 # 15  - Rx Prednisone 10 mg #20 - pulse/taper - Rx Norco 5 #50  2. Acute sinusitis, unspecified  ROV - PRN

## 2014-03-22 ENCOUNTER — Ambulatory Visit: Payer: Self-pay | Admitting: Physician Assistant

## 2014-03-22 ENCOUNTER — Encounter: Payer: Self-pay | Admitting: Physician Assistant

## 2014-03-22 ENCOUNTER — Ambulatory Visit (INDEPENDENT_AMBULATORY_CARE_PROVIDER_SITE_OTHER): Payer: BC Managed Care – PPO | Admitting: Physician Assistant

## 2014-03-22 VITALS — BP 130/80 | HR 68 | Temp 98.1°F | Resp 16 | Wt 193.0 lb

## 2014-03-22 DIAGNOSIS — I10 Essential (primary) hypertension: Secondary | ICD-10-CM

## 2014-03-22 DIAGNOSIS — Z9861 Coronary angioplasty status: Secondary | ICD-10-CM

## 2014-03-22 DIAGNOSIS — E119 Type 2 diabetes mellitus without complications: Secondary | ICD-10-CM

## 2014-03-22 DIAGNOSIS — E782 Mixed hyperlipidemia: Secondary | ICD-10-CM

## 2014-03-22 DIAGNOSIS — E559 Vitamin D deficiency, unspecified: Secondary | ICD-10-CM

## 2014-03-22 DIAGNOSIS — Z79899 Other long term (current) drug therapy: Secondary | ICD-10-CM

## 2014-03-22 DIAGNOSIS — I2510 Atherosclerotic heart disease of native coronary artery without angina pectoris: Secondary | ICD-10-CM

## 2014-03-22 NOTE — Patient Instructions (Addendum)
Stop the reglan/metoclopramide Get back on the allegra If you white blood cell count is up I will send in an antibiotic for you to take.     Bad carbs also include fruit juice, alcohol, and sweet tea. These are empty calories that do not signal to your brain that you are full.   Please remember the good carbs are still carbs which convert into sugar. So please measure them out no more than 1/2-1 cup of rice, oatmeal, pasta, and beans.  Veggies are however free foods! Pile them on.   I like lean protein at every meal such as chicken, [place], pork chops, cottage cheese, etc. Just do not fry these meats and please center your meal around vegetable, the meats should be a side dish.   No all fruit is created equal. Please see the list below, the fruit at the bottom is higher in sugars than the fruit at the top

## 2014-03-22 NOTE — Progress Notes (Signed)
Assessment and Plan:  Hypertension: Continue medication, monitor blood pressure at home. Continue DASH diet. Cholesterol: Continue diet and exercise. Check cholesterol.  Diabetes with history of CAD-Continue diet and exercise. Check A1C Vitamin D Def- check level and continue medications.  ? Allergic rhintis versus infection- get back on allegra, we will check CBC, if elevated will send in ABX  Continue diet and meds as discussed. Further disposition pending results of labs. Discussed med's effects and SE's.    HPI 68 y.o. male  presents for 3 month follow up with hypertension, hyperlipidemia, diabetes and vitamin D. His blood pressure has been controlled at home, today their BP is BP: 130/80 mmHg He does not workout. He denies chest pain, shortness of breath, dizziness.  He is on cholesterol medication, lipitor 40mg  1/2 tablet daily and denies myalgias. His cholesterol is at goal. The cholesterol last visit was:   Lab Results  Component Value Date   CHOL 110 10/30/2013   HDL 41.70 10/30/2013   LDLCALC 30 10/30/2013   LDLDIRECT 45.7 10/30/2013   TRIG 191.0* 10/30/2013   CHOLHDL 3 10/30/2013   He has been working on diet and exercise for Diabetes, he is on invokana, glipizide and metformin, he is on an ACE, and denies polydipsia, polyuria and visual disturbances. Last A1C in the office was:  Lab Results  Component Value Date   HGBA1C 8.9* 11/13/2013   Patient is on Vitamin D supplement. Lab Results  Component Value Date   VD25OH 85 11/13/2013    He was treated with Levaquin on 03/06/14 for a sinus infection, he states he was starting to feel better but then got worse. He also complains of dizziness occ with standing. He has ran out of allegra and has not taken it for 4-5 days. He also just took his last prednisone pill on Tuesday and states he always feels bad after stopping these.   Current Medications:  Current Outpatient Prescriptions on File Prior to Visit  Medication Sig Dispense  Refill  . aspirin 81 MG tablet Take 81 mg by mouth daily.      Marland Kitchen atorvastatin (LIPITOR) 40 MG tablet Take 0.5 tablets (20 mg total) by mouth daily.  30 tablet  3  . azelastine (ASTELIN) 137 MCG/SPRAY nasal spray Place 2 sprays into both nostrils 2 (two) times daily. Use in each nostril as directed  30 mL  2  . Canagliflozin (INVOKANA) 300 MG TABS Take 1/2 to 1 tablet daily as directed for diabetes  90 tablet  99  . Cholecalciferol (GNP VITAMIN D SUPER STRENGTH) 5000 UNITS TABS Take 1 tablet (5,000 Units total) by mouth 1 day or 1 dose.  30 tablet  99  . enalapril (VASOTEC) 20 MG tablet Take 20 mg by mouth daily.      . fluticasone (FLONASE) 50 MCG/ACT nasal spray Place 1 spray into both nostrils at bedtime.  16 g  2  . GAVILYTE-N WITH FLAVOR PACK 420 G solution       . glipiZIDE (GLUCOTROL) 5 MG tablet Take 5 mg by mouth 2 (two) times daily before a meal.      . HYDROcodone-acetaminophen (NORCO) 5-325 MG per tablet Take 1/2 to 1 tablet every 3-4 hours as need for cough or pain  50 tablet  0  . Magnesium 250 MG TABS Take 250 mg by mouth daily.      . metFORMIN (GLUCOPHAGE) 500 MG tablet Take 500 mg by mouth 2 (two) times daily with a meal.      .  metoCLOPramide (REGLAN) 10 MG tablet Take 1 tablet (10 mg total) by mouth at bedtime.  90 tablet  99  . metoprolol succinate (TOPROL-XL) 100 MG 24 hr tablet Take 1 tablet (100 mg total) by mouth daily. Take with or immediately following a meal.  90 tablet  0  . montelukast (SINGULAIR) 10 MG tablet Take 10 mg by mouth at bedtime.      . nitroGLYCERIN (NITROSTAT) 0.4 MG SL tablet Place 1 tablet (0.4 mg total) under the tongue every 5 (five) minutes x 3 doses as needed for chest pain.  25 tablet  0  . Omega-3 Fatty Acids (FISH OIL) 1000 MG CAPS Take 1 capsule (1,000 mg total) by mouth 2 (two) times daily.  360 capsule  0  . pantoprazole (PROTONIX) 40 MG tablet Take 40 mg by mouth daily.       No current facility-administered medications on file prior to  visit.   Medical History:  Past Medical History  Diagnosis Date  . Diabetes mellitus without complication   . Hypertension   . GERD (gastroesophageal reflux disease)   . Chronic back pain   . CAD (coronary artery disease)    Allergies:  Allergies  Allergen Reactions  . Ibuprofen     GI upset  . Naproxen     GI upset     Review of Systems: [X]  = complains of  [ ]  = denies  General: Fatigue [ ]  Fever [ ]  Chills [ ]  Weakness [ ]   Insomnia [ ]  Eyes: Redness [ ]  Blurred vision [ ]  Diplopia [ ]   ENT: Congestion Valu.Nieves ] Sinus Pain [ ]  Post Nasal Drip Valu.Nieves ] Sore Throat [ ]  Earache [ ]   Cardiac: Chest pain/pressure [ ]  SOB [ ]  Orthopnea [ ]   Palpitations [ ]   Paroxysmal nocturnal dyspnea[ ]  Claudication [ ]  Edema [ ]   Pulmonary: Cough [ ]  Wheezing[ ]   SOB [ ]   Snoring [ ]   GI: Nausea [ ]  Vomiting[ ]  Dysphagia[ ]  Heartburn[ ]  Abdominal pain [ ]  Constipation [ ] ; Diarrhea [ ] ; BRBPR [ ]  Melena[ ]  GU: Hematuria[ ]  Dysuria [ ]  Nocturia[ ]  Urgency [ ]   Hesitancy [ ]  Discharge [ ]  Neuro: Headaches[ ]  Vertigo[X ] Paresthesias[ ]  Spasm [ ]  Speech changes [ ]  Incoordination [ ]   Ortho: Arthritis [ ]  Joint pain [ ]  Muscle pain [ ]  Joint swelling [ ]  Back Pain [ ]  Skin:  Rash [ ]   Pruritis [ ]  Change in skin lesion [ ]   Psych: Depression[ ]  Anxiety[ ]  Confusion [ ]  Memory loss [ ]   Heme/Lypmh: Bleeding [ ]  Bruising [ ]  Enlarged lymph nodes [ ]   Endocrine: Visual blurring [ ]  Paresthesia [ ]  Polyuria [ ]  Polydypsea [ ]    Heat/cold intolerance [ ]  Hypoglycemia [ ]   Family history- Review and unchanged Social history- Review and unchanged Physical Exam: BP 130/80  Pulse 68  Temp(Src) 98.1 F (36.7 C)  Resp 16  Wt 193 lb (87.544 kg) Wt Readings from Last 3 Encounters:  03/22/14 193 lb (87.544 kg)  03/06/14 190 lb 3.2 oz (86.274 kg)  01/04/14 198 lb (89.812 kg)   General Appearance: Well nourished, in no apparent distress. Eyes: PERRLA, EOMs, conjunctiva no swelling or erythema Sinuses: No  Frontal/maxillary tenderness ENT/Mouth: Ext aud canals clear, TMs without erythema, bulging. No erythema, swelling, or exudate on post pharynx.  Tonsils not swollen or erythematous. Hearing normal.  Neck: Supple, thyroid normal.  Respiratory: Respiratory effort normal, BS  equal bilaterally without rales, rhonchi, wheezing or stridor.  Cardio: RRR with no MRGs. Brisk peripheral pulses without edema.  Abdomen: Soft, + BS.  Non tender, no guarding, rebound, hernias, masses. Lymphatics: Non tender without lymphadenopathy.  Musculoskeletal: Full ROM, 5/5 strength, normal gait.  Skin: Warm, dry without rashes, lesions, ecchymosis.  Neuro: Cranial nerves intact. No cerebellar symptoms. Sensation intact.  Psych: Awake and oriented X 3, normal affect, Insight and Judgment appropriate.    Vicie Mutters 4:10 PM

## 2014-03-23 ENCOUNTER — Other Ambulatory Visit: Payer: Self-pay | Admitting: Internal Medicine

## 2014-03-23 ENCOUNTER — Telehealth: Payer: Self-pay

## 2014-03-23 LAB — CBC WITH DIFFERENTIAL/PLATELET
BASOS ABS: 0 10*3/uL (ref 0.0–0.1)
Basophils Relative: 0 % (ref 0–1)
EOS ABS: 0.2 10*3/uL (ref 0.0–0.7)
EOS PCT: 3 % (ref 0–5)
HCT: 38.6 % — ABNORMAL LOW (ref 39.0–52.0)
Hemoglobin: 13.1 g/dL (ref 13.0–17.0)
Lymphocytes Relative: 39 % (ref 12–46)
Lymphs Abs: 3 10*3/uL (ref 0.7–4.0)
MCH: 28.9 pg (ref 26.0–34.0)
MCHC: 33.9 g/dL (ref 30.0–36.0)
MCV: 85.2 fL (ref 78.0–100.0)
MONO ABS: 0.5 10*3/uL (ref 0.1–1.0)
Monocytes Relative: 6 % (ref 3–12)
Neutro Abs: 4.1 10*3/uL (ref 1.7–7.7)
Neutrophils Relative %: 52 % (ref 43–77)
Platelets: 189 10*3/uL (ref 150–400)
RBC: 4.53 MIL/uL (ref 4.22–5.81)
RDW: 13.9 % (ref 11.5–15.5)
WBC: 7.8 10*3/uL (ref 4.0–10.5)

## 2014-03-23 LAB — VITAMIN D 25 HYDROXY (VIT D DEFICIENCY, FRACTURES): VIT D 25 HYDROXY: 65 ng/mL (ref 30–89)

## 2014-03-23 LAB — HEPATIC FUNCTION PANEL
ALBUMIN: 4.1 g/dL (ref 3.5–5.2)
ALT: 15 U/L (ref 0–53)
AST: 12 U/L (ref 0–37)
Alkaline Phosphatase: 80 U/L (ref 39–117)
BILIRUBIN TOTAL: 0.3 mg/dL (ref 0.2–1.2)
Bilirubin, Direct: 0.1 mg/dL (ref 0.0–0.3)
Indirect Bilirubin: 0.2 mg/dL (ref 0.2–1.2)
TOTAL PROTEIN: 6.4 g/dL (ref 6.0–8.3)

## 2014-03-23 LAB — BASIC METABOLIC PANEL WITH GFR
BUN: 21 mg/dL (ref 6–23)
CALCIUM: 9.4 mg/dL (ref 8.4–10.5)
CO2: 25 mEq/L (ref 19–32)
CREATININE: 0.95 mg/dL (ref 0.50–1.35)
Chloride: 103 mEq/L (ref 96–112)
GFR, EST NON AFRICAN AMERICAN: 83 mL/min
GFR, Est African American: >89 mL/min
Glucose, Bld: 323 mg/dL — ABNORMAL HIGH (ref 70–99)
Potassium: 4.5 mEq/L (ref 3.5–5.3)
Sodium: 136 mEq/L (ref 135–145)

## 2014-03-23 LAB — MAGNESIUM: MAGNESIUM: 1.9 mg/dL (ref 1.5–2.5)

## 2014-03-23 LAB — LIPID PANEL
CHOL/HDL RATIO: 2.1 ratio
CHOLESTEROL: 115 mg/dL (ref 0–200)
HDL: 54 mg/dL (ref >39)
LDL Cholesterol: 16 mg/dL (ref 0–99)
Triglycerides: 226 mg/dL — ABNORMAL HIGH (ref <150)
VLDL: 45 mg/dL — ABNORMAL HIGH (ref 0–40)

## 2014-03-23 LAB — HEMOGLOBIN A1C
Hgb A1c MFr Bld: 10.5 % — ABNORMAL HIGH (ref <5.7)
MEAN PLASMA GLUCOSE: 255 mg/dL — AB (ref <117)

## 2014-03-23 LAB — INSULIN, FASTING: INSULIN FASTING, SERUM: 44 u[IU]/mL — AB (ref 3–28)

## 2014-03-23 LAB — TSH: TSH: 0.248 u[IU]/mL — ABNORMAL LOW (ref 0.350–4.500)

## 2014-03-23 NOTE — Telephone Encounter (Signed)
Patient called and said that he was confused on what medication Michael Mutters, PA wanted him to stop, return call to patient and advised him that he was not to stop any medication but he was to increase Glipizide to three times daily, she also advised him to start Allegra daily and to follow up with Dr Melford Aase in one month

## 2014-03-27 ENCOUNTER — Other Ambulatory Visit: Payer: Self-pay

## 2014-03-27 ENCOUNTER — Encounter: Payer: Self-pay | Admitting: Internal Medicine

## 2014-03-27 MED ORDER — METOPROLOL SUCCINATE ER 100 MG PO TB24: 100.0000 mg | ORAL_TABLET | Freq: Every day | ORAL | Status: DC

## 2014-03-27 MED ORDER — DOXYCYCLINE HYCLATE 100 MG PO TABS: 100.0000 mg | ORAL_TABLET | Freq: Two times a day (BID) | ORAL | Status: DC

## 2014-03-29 ENCOUNTER — Other Ambulatory Visit: Payer: Self-pay | Admitting: Internal Medicine

## 2014-04-02 ENCOUNTER — Other Ambulatory Visit: Payer: Self-pay | Admitting: Internal Medicine

## 2014-04-02 ENCOUNTER — Encounter: Payer: Self-pay | Admitting: Internal Medicine

## 2014-04-02 DIAGNOSIS — J3089 Other allergic rhinitis: Secondary | ICD-10-CM

## 2014-04-02 MED ORDER — PREDNISONE 10 MG PO TABS: ORAL_TABLET | ORAL | Status: DC

## 2014-04-04 ENCOUNTER — Encounter: Payer: Self-pay | Admitting: Internal Medicine

## 2014-04-09 ENCOUNTER — Encounter: Payer: Self-pay | Admitting: Internal Medicine

## 2014-04-09 ENCOUNTER — Emergency Department (HOSPITAL_COMMUNITY)
Admission: EM | Admit: 2014-04-09 | Discharge: 2014-04-10 | Disposition: A | Discharge status: Home / Self Care | Payer: BC Managed Care – PPO | Attending: Emergency Medicine | Admitting: Emergency Medicine

## 2014-04-09 ENCOUNTER — Emergency Department (HOSPITAL_COMMUNITY): Payer: BC Managed Care – PPO

## 2014-04-09 ENCOUNTER — Encounter (HOSPITAL_COMMUNITY): Payer: Self-pay | Admitting: Emergency Medicine

## 2014-04-09 DIAGNOSIS — I10 Essential (primary) hypertension: Secondary | ICD-10-CM | POA: Insufficient documentation

## 2014-04-09 DIAGNOSIS — G8929 Other chronic pain: Secondary | ICD-10-CM | POA: Insufficient documentation

## 2014-04-09 DIAGNOSIS — E119 Type 2 diabetes mellitus without complications: Secondary | ICD-10-CM | POA: Insufficient documentation

## 2014-04-09 DIAGNOSIS — Z87891 Personal history of nicotine dependence: Secondary | ICD-10-CM | POA: Insufficient documentation

## 2014-04-09 DIAGNOSIS — R1084 Generalized abdominal pain: Secondary | ICD-10-CM

## 2014-04-09 DIAGNOSIS — Z79899 Other long term (current) drug therapy: Secondary | ICD-10-CM | POA: Insufficient documentation

## 2014-04-09 DIAGNOSIS — Z7982 Long term (current) use of aspirin: Secondary | ICD-10-CM | POA: Insufficient documentation

## 2014-04-09 DIAGNOSIS — I2510 Atherosclerotic heart disease of native coronary artery without angina pectoris: Secondary | ICD-10-CM | POA: Insufficient documentation

## 2014-04-09 DIAGNOSIS — Z792 Long term (current) use of antibiotics: Secondary | ICD-10-CM | POA: Insufficient documentation

## 2014-04-09 DIAGNOSIS — K219 Gastro-esophageal reflux disease without esophagitis: Secondary | ICD-10-CM | POA: Insufficient documentation

## 2014-04-09 LAB — CBC
HCT: 38.7 % — ABNORMAL LOW (ref 39.0–52.0)
HEMOGLOBIN: 12.6 g/dL — AB (ref 13.0–17.0)
MCH: 29.4 pg (ref 26.0–34.0)
MCHC: 32.6 g/dL (ref 30.0–36.0)
MCV: 90.2 fL (ref 78.0–100.0)
PLATELETS: 190 10*3/uL (ref 150–400)
RBC: 4.29 MIL/uL (ref 4.22–5.81)
RDW: 13.7 % (ref 11.5–15.5)
WBC: 9.3 10*3/uL (ref 4.0–10.5)

## 2014-04-09 LAB — HEPATIC FUNCTION PANEL
ALBUMIN: 3.9 g/dL (ref 3.5–5.2)
ALK PHOS: 75 U/L (ref 39–117)
ALT: 13 U/L (ref 0–53)
AST: 12 U/L (ref 0–37)
Bilirubin, Direct: <0.2 mg/dL (ref 0.0–0.3)
TOTAL PROTEIN: 6.7 g/dL (ref 6.0–8.3)
Total Bilirubin: 0.4 mg/dL (ref 0.3–1.2)

## 2014-04-09 LAB — BASIC METABOLIC PANEL
ANION GAP: 15 (ref 5–15)
BUN: 18 mg/dL (ref 6–23)
CHLORIDE: 102 meq/L (ref 96–112)
CO2: 21 meq/L (ref 19–32)
Calcium: 9.2 mg/dL (ref 8.4–10.5)
Creatinine, Ser: 0.78 mg/dL (ref 0.50–1.35)
GFR calc non Af Amer: >90 mL/min (ref >90)
Glucose, Bld: 399 mg/dL — ABNORMAL HIGH (ref 70–99)
POTASSIUM: 4.4 meq/L (ref 3.7–5.3)
SODIUM: 138 meq/L (ref 137–147)

## 2014-04-09 LAB — I-STAT TROPONIN, ED: TROPONIN I, POC: 0 ng/mL (ref 0.00–0.08)

## 2014-04-09 MED ORDER — SENNOSIDES-DOCUSATE SODIUM 8.6-50 MG PO TABS: 2.0000 | ORAL_TABLET | Freq: Every day | ORAL | Status: DC

## 2014-04-09 MED ORDER — MAGNESIUM CITRATE PO SOLN: 1.0000 | Freq: Once | ORAL | Status: DC

## 2014-04-09 MED ORDER — SODIUM CHLORIDE 0.9 % IV SOLN
INTRAVENOUS | Status: DC
  Administered 2014-04-09: 21:00:00 via INTRAVENOUS

## 2014-04-09 MED ORDER — IOHEXOL 300 MG/ML  SOLN
25.0000 mL | INTRAMUSCULAR | Status: AC
  Administered 2014-04-09: 25 mL via ORAL

## 2014-04-09 NOTE — ED Notes (Signed)
Pt returned from CT °

## 2014-04-09 NOTE — Discharge Instructions (Signed)
As discussed, your evaluation tonight has been largely reassuring, though there is some evidence of likely constipation and bowel distention.  Take all medication as directed, and be sure to follow up with your primary care physician and your gastroenterologist.  Return here for any concerning changes in your condition.   Abdominal Pain Many things can cause abdominal pain. Usually, abdominal pain is not caused by a disease and will improve without treatment. It can often be observed and treated at home. Your health care provider will do a physical exam and possibly order blood tests and X-rays to help determine the seriousness of your pain. However, in many cases, more time must pass before a clear cause of the pain can be found. Before that point, your health care provider may not know if you need more testing or further treatment. HOME CARE INSTRUCTIONS  Monitor your abdominal pain for any changes. The following actions may help to alleviate any discomfort you are experiencing:  Only take over-the-counter or prescription medicines as directed by your health care provider.  Do not take laxatives unless directed to do so by your health care provider.  Try a clear liquid diet (broth, tea, or water) as directed by your health care provider. Slowly move to a bland diet as tolerated. SEEK MEDICAL CARE IF:  You have unexplained abdominal pain.  You have abdominal pain associated with nausea or diarrhea.  You have pain when you urinate or have a bowel movement.  You experience abdominal pain that wakes you in the night.  You have abdominal pain that is worsened or improved by eating food.  You have abdominal pain that is worsened with eating fatty foods.  You have a fever. SEEK IMMEDIATE MEDICAL CARE IF:   Your pain does not go away within 2 hours.  You keep throwing up (vomiting).  Your pain is felt only in portions of the abdomen, such as the right side or the left lower portion of  the abdomen.  You pass bloody or black tarry stools. MAKE SURE YOU:  Understand these instructions.   Will watch your condition.   Will get help right away if you are not doing well or get worse.  Document Released: 06/10/2005 Document Revised: 09/05/2013 Document Reviewed: 05/10/2013 [facility] Patient Information 2015 [place], [place]. This information is not intended to replace advice given to you by your health care provider. Make sure you discuss any questions you have with your health care provider.

## 2014-04-09 NOTE — ED Notes (Signed)
Md Vanita Panda at bedside.

## 2014-04-09 NOTE — ED Notes (Addendum)
Pt. reports left chest pain onset last night and mid/low abdominal pain today , denies SOB , no nausea or vomitting . History of CAD/coronary stent his cardiologist is Dr. Etter Sjogren .

## 2014-04-10 NOTE — ED Provider Notes (Signed)
CSN: [id number]     Arrival date & time 04/09/14  1936 History   First MD Initiated Contact with Patient 04/09/14 2000     Chief Complaint  Patient presents with  . Chest Pain  . Abdominal Pain     (Consider location/radiation/quality/duration/timing/severity/associated sxs/prior Treatment) HPI Patient presents with no chest pain, contrary to nursing notes. He does complain of abdominal pain. He began approximately one day ago, since onset has been moderate, severe intermittently.  The pain is focally about the left side, mid and lower abdomen.  There is no nausea or vomiting. No relief with MiraLAX or other OTC medication.  Past Medical History  Diagnosis Date  . Diabetes mellitus without complication   . Hypertension   . GERD (gastroesophageal reflux disease)   . Chronic back pain   . CAD (coronary artery disease)    Past Surgical History  Procedure Laterality Date  . Appendectomy    . Cardiac surgery      3 stent placed.   Family History  Problem Relation Age of Onset  . Diabetes Sister    History  Substance Use Topics  . Smoking status: Former Smoker -- 40 years    Quit date: 07/27/2011  . Smokeless tobacco: Never Used  . Alcohol Use: No    Review of Systems  Constitutional:       Per HPI, otherwise negative  HENT:       Per HPI, otherwise negative  Respiratory:       Per HPI, otherwise negative  Cardiovascular:       Per HPI, otherwise negative  Gastrointestinal: Negative for vomiting.  Endocrine:       Negative aside from HPI  Genitourinary:       Neg aside from HPI   Musculoskeletal:       Per HPI, otherwise negative  Skin: Negative.   Neurological: Negative for syncope.      Allergies  Ibuprofen and Naproxen  Home Medications   Prior to Admission medications   Medication Sig Start Date End Date Taking? Authorizing Provider  aspirin 81 MG tablet Take 81 mg by mouth daily.   Yes Historical Provider, MD  atorvastatin (LIPITOR) 40 MG tablet  Take 20 mg by mouth daily.   Yes Historical Provider, MD  azelastine (ASTELIN) 0.1 % nasal spray Place 2 sprays into both nostrils 2 (two) times daily. Use in each nostril as directed   Yes Historical Provider, MD  Canagliflozin (INVOKANA) 300 MG TABS Take 150-300 mg by mouth daily.   Yes Historical Provider, MD  enalapril (VASOTEC) 20 MG tablet Take 20 mg by mouth daily.   Yes Historical Provider, MD  fluticasone (FLONASE) 50 MCG/ACT nasal spray Place 1 spray into both nostrils at bedtime.   Yes Historical Provider, MD  glipiZIDE (GLUCOTROL) 5 MG tablet Take 5 mg by mouth 2 (two) times daily before a meal.   Yes Historical Provider, MD  Magnesium 250 MG TABS Take 250 mg by mouth daily.   Yes Historical Provider, MD  metFORMIN (GLUCOPHAGE) 500 MG tablet Take 500 mg by mouth 2 (two) times daily with a meal.   Yes Historical Provider, MD  metoprolol succinate (TOPROL-XL) 100 MG 24 hr tablet Take 100 mg by mouth daily. Take with or immediately following a meal.   Yes Historical Provider, MD  montelukast (SINGULAIR) 10 MG tablet Take 10 mg by mouth at bedtime.   Yes Historical Provider, MD  nitroGLYCERIN (NITROSTAT) 0.4 MG SL tablet Place 0.4 mg under  the tongue every 5 (five) minutes as needed for chest pain.   Yes Historical Provider, MD  Omega-3 Fatty Acids (FISH OIL) 1000 MG CAPS Take 1,000 mg by mouth 2 (two) times daily.   Yes Historical Provider, MD  pantoprazole (PROTONIX) 40 MG tablet Take 40 mg by mouth daily.   Yes Historical Provider, MD  doxycycline (VIBRAMYCIN) 100 MG capsule Take 100 mg by mouth 2 (two) times daily.    Historical Provider, MD  magnesium citrate SOLN Take 296 mLs (1 Bottle total) by mouth once. 04/09/14   Carmin Muskrat, MD  senna-docusate (SENOKOT-S) 8.6-50 MG per tablet Take 2 tablets by mouth daily. 04/09/14   Carmin Muskrat, MD   BP 116/75  Pulse 66  Temp(Src) 99 F (37.2 C) (Oral)  Resp 18  Ht 5\' 10"  (1.778 m)  Wt 190 lb (86.183 kg)  BMI 27.26 kg/m2  SpO2  99% Physical Exam  Nursing note and vitals reviewed. Constitutional: He is oriented to person, place, and time. He appears well-developed. No distress.  HENT:  Head: Normocephalic and atraumatic.  Eyes: Conjunctivae and EOM are normal.  Cardiovascular: Normal rate and regular rhythm.   Pulmonary/Chest: Effort normal. No stridor. No respiratory distress.  Abdominal: He exhibits no distension. There is no hepatosplenomegaly. There is tenderness in the periumbilical area, left upper quadrant and left lower quadrant. There is guarding. There is no rigidity and no rebound.  Musculoskeletal: He exhibits no edema.  Neurological: He is alert and oriented to person, place, and time.  Skin: Skin is warm and dry.  Psychiatric: He has a normal mood and affect.    ED Course  Procedures (including critical care time) Labs Review Labs Reviewed  CBC - Abnormal; Notable for the following:    Hemoglobin 12.6 (*)    HCT 38.7 (*)    All other components within normal limits  BASIC METABOLIC PANEL - Abnormal; Notable for the following:    Glucose, Bld 399 (*)    All other components within normal limits  HEPATIC FUNCTION PANEL  I-STAT TROPOININ, ED    Imaging Review Ct Abdomen Pelvis Wo Contrast  04/09/2014   CLINICAL DATA:  Left chest pain since last night. Mid and low abdominal pain today.  EXAM: CT ABDOMEN AND PELVIS WITHOUT CONTRAST  TECHNIQUE: Multidetector CT imaging of the abdomen and pelvis was performed following the standard protocol without IV contrast.  COMPARISON:  CT abdomen and pelvis 02/09/2011, MRI abdomen 02/21/2011  FINDINGS: Emphysematous changes and fibrosis versus dependent atelectasis demonstrated in the lung bases. Coronary artery calcifications.  Complex cystic structure demonstrated in the upper pole right kidney appears unchanged in size since prior contrast-enhanced study. No hydronephrosis or hydroureter. Punctate size nonobstructing stone in the lower pole right kidney. No  ureteral or bladder stones.  The unenhanced appearance of the liver, spleen, gallbladder, pancreas, adrenal glands, abdominal aorta, inferior vena cava, and retroperitoneal lymph nodes is unremarkable. Stomach, small bowel, and colon are normal for degree of distention. Stool fills the colon. Contrast material flows through to the colon without evidence of bowel obstruction. No free air or free fluid in the abdomen. Scattered mesenteric lymph nodes are not pathologically enlarged and are likely reactive.  Pelvis: Prostate gland is not enlarged. Bladder wall is not thickened. No free or loculated pelvic fluid collections. Surgical clips in the lower mesenteric. No evidence of diverticulitis. Appendix is surgically absent. No pelvic mass or lymphadenopathy. No destructive bone lesions.  IMPRESSION: Punctate size nonobstructing stone in the lower pole  right kidney. No ureteral stones or obstruction.   Electronically Signed   By: Lucienne Capers M.D.   On: 04/09/2014 23:09   I evaluated the x-ray, discussed with the patient, demonstrated findings to the patient. I agree with the interpretation     EKG Interpretation   Date/Time:  Monday April 09 2014 19:40:53 EDT Ventricular Rate:  65 PR Interval:  154 QRS Duration: 88 QT Interval:  406 QTC Calculation: 422 R Axis:   39 Text Interpretation:  Normal sinus rhythm Normal ECG Sinus rhythm Artifact  Borderline ECG Confirmed by Carmin Muskrat  MD ([id number]) on 04/09/2014  8:12:22 PM      MDM   Final diagnoses:  Generalized abdominal pain    Patient presents with abdominal pain.  On exam he is awake and alert, with no chest pain, dyspnea or other signs of occult ACS.  The patient's evaluation demonstrates likely distention from constipation, which was clearly evident on the CT scan.  Patient was started on a new bowel regimen, advised to follow up with gastroenterology for further evaluation, management.    Carmin Muskrat, MD 04/10/14 0002

## 2014-04-17 ENCOUNTER — Encounter: Payer: Self-pay | Admitting: Internal Medicine

## 2014-05-13 ENCOUNTER — Encounter (HOSPITAL_COMMUNITY): Payer: Self-pay | Admitting: Emergency Medicine

## 2014-05-13 ENCOUNTER — Emergency Department (HOSPITAL_COMMUNITY)
Admission: EM | Admit: 2014-05-13 | Discharge: 2014-05-13 | Disposition: A | Discharge status: Home / Self Care | Payer: BC Managed Care – PPO | Attending: Emergency Medicine | Admitting: Emergency Medicine

## 2014-05-13 ENCOUNTER — Emergency Department (HOSPITAL_COMMUNITY): Payer: BC Managed Care – PPO

## 2014-05-13 DIAGNOSIS — G8929 Other chronic pain: Secondary | ICD-10-CM | POA: Insufficient documentation

## 2014-05-13 DIAGNOSIS — Z79899 Other long term (current) drug therapy: Secondary | ICD-10-CM | POA: Insufficient documentation

## 2014-05-13 DIAGNOSIS — I2510 Atherosclerotic heart disease of native coronary artery without angina pectoris: Secondary | ICD-10-CM | POA: Insufficient documentation

## 2014-05-13 DIAGNOSIS — K219 Gastro-esophageal reflux disease without esophagitis: Secondary | ICD-10-CM | POA: Diagnosis not present

## 2014-05-13 DIAGNOSIS — R0789 Other chest pain: Secondary | ICD-10-CM | POA: Diagnosis not present

## 2014-05-13 DIAGNOSIS — Z7982 Long term (current) use of aspirin: Secondary | ICD-10-CM | POA: Insufficient documentation

## 2014-05-13 DIAGNOSIS — I10 Essential (primary) hypertension: Secondary | ICD-10-CM | POA: Diagnosis not present

## 2014-05-13 DIAGNOSIS — K5909 Other constipation: Secondary | ICD-10-CM | POA: Insufficient documentation

## 2014-05-13 DIAGNOSIS — R109 Unspecified abdominal pain: Secondary | ICD-10-CM | POA: Diagnosis not present

## 2014-05-13 DIAGNOSIS — E119 Type 2 diabetes mellitus without complications: Secondary | ICD-10-CM | POA: Diagnosis not present

## 2014-05-13 DIAGNOSIS — Z87891 Personal history of nicotine dependence: Secondary | ICD-10-CM | POA: Diagnosis not present

## 2014-05-13 LAB — CBC WITH DIFFERENTIAL/PLATELET
BASOS ABS: 0.1 10*3/uL (ref 0.0–0.1)
Basophils Relative: 1 % (ref 0–1)
Eosinophils Absolute: 0.3 10*3/uL (ref 0.0–0.7)
Eosinophils Relative: 3 % (ref 0–5)
HEMATOCRIT: 41.1 % (ref 39.0–52.0)
Hemoglobin: 13.7 g/dL (ref 13.0–17.0)
LYMPHS ABS: 2.5 10*3/uL (ref 0.7–4.0)
LYMPHS PCT: 31 % (ref 12–46)
MCH: 29.1 pg (ref 26.0–34.0)
MCHC: 33.3 g/dL (ref 30.0–36.0)
MCV: 87.3 fL (ref 78.0–100.0)
MONO ABS: 0.6 10*3/uL (ref 0.1–1.0)
Monocytes Relative: 8 % (ref 3–12)
NEUTROS ABS: 4.5 10*3/uL (ref 1.7–7.7)
Neutrophils Relative %: 57 % (ref 43–77)
Platelets: 227 10*3/uL (ref 150–400)
RBC: 4.71 MIL/uL (ref 4.22–5.81)
RDW: 12.6 % (ref 11.5–15.5)
WBC: 8 10*3/uL (ref 4.0–10.5)

## 2014-05-13 LAB — URINALYSIS, ROUTINE W REFLEX MICROSCOPIC
Bilirubin Urine: NEGATIVE
Hgb urine dipstick: NEGATIVE
KETONES UR: 15 mg/dL — AB
Leukocytes, UA: NEGATIVE
Nitrite: NEGATIVE
PH: 6 (ref 5.0–8.0)
Protein, ur: NEGATIVE mg/dL
Specific Gravity, Urine: 1.026 (ref 1.005–1.030)
Urobilinogen, UA: 1 mg/dL (ref 0.0–1.0)

## 2014-05-13 LAB — I-STAT TROPONIN, ED: Troponin i, poc: 0 ng/mL (ref 0.00–0.08)

## 2014-05-13 LAB — COMPREHENSIVE METABOLIC PANEL
ALBUMIN: 3.9 g/dL (ref 3.5–5.2)
ALK PHOS: 71 U/L (ref 39–117)
ALT: 17 U/L (ref 0–53)
AST: 19 U/L (ref 0–37)
Anion gap: 14 (ref 5–15)
BUN: 12 mg/dL (ref 6–23)
CHLORIDE: 102 meq/L (ref 96–112)
CO2: 23 meq/L (ref 19–32)
Calcium: 9.7 mg/dL (ref 8.4–10.5)
Creatinine, Ser: 0.95 mg/dL (ref 0.50–1.35)
GFR calc Af Amer: >90 mL/min (ref >90)
GFR calc non Af Amer: 85 mL/min — ABNORMAL LOW (ref >90)
Glucose, Bld: 110 mg/dL — ABNORMAL HIGH (ref 70–99)
Potassium: 4.4 mEq/L (ref 3.7–5.3)
Sodium: 139 mEq/L (ref 137–147)
Total Bilirubin: 0.3 mg/dL (ref 0.3–1.2)
Total Protein: 7.2 g/dL (ref 6.0–8.3)

## 2014-05-13 LAB — URINE MICROSCOPIC-ADD ON

## 2014-05-13 LAB — LIPASE, BLOOD: Lipase: 19 U/L (ref 11–59)

## 2014-05-13 MED ORDER — LACTULOSE 10 GM/15ML PO SOLN: 20.0000 g | Freq: Two times a day (BID) | ORAL | Status: DC | PRN

## 2014-05-13 MED ORDER — IOHEXOL 300 MG/ML  SOLN: 100.0000 mL | Freq: Once | INTRAMUSCULAR | Status: DC | PRN

## 2014-05-13 MED ORDER — HYDROMORPHONE HCL PF 1 MG/ML IJ SOLN
1.0000 mg | Freq: Once | INTRAMUSCULAR | Status: AC
  Administered 2014-05-13: 1 mg via INTRAVENOUS
  Filled 2014-05-13: qty 1

## 2014-05-13 MED ORDER — DICYCLOMINE HCL 20 MG PO TABS: 20.0000 mg | ORAL_TABLET | Freq: Two times a day (BID) | ORAL | Status: DC

## 2014-05-13 MED ORDER — GI COCKTAIL ~~LOC~~
30.0000 mL | Freq: Once | ORAL | Status: AC
  Administered 2014-05-13: 30 mL via ORAL
  Filled 2014-05-13: qty 30

## 2014-05-13 MED ORDER — SODIUM CHLORIDE 0.9 % IV SOLN
Freq: Once | INTRAVENOUS | Status: AC
  Administered 2014-05-13: 21:00:00 via INTRAVENOUS

## 2014-05-13 MED ORDER — ONDANSETRON HCL 4 MG/2ML IJ SOLN
4.0000 mg | Freq: Once | INTRAMUSCULAR | Status: AC
  Administered 2014-05-13: 4 mg via INTRAVENOUS
  Filled 2014-05-13: qty 2

## 2014-05-13 MED ORDER — DICYCLOMINE HCL 10 MG/ML IM SOLN
20.0000 mg | Freq: Once | INTRAMUSCULAR | Status: AC
  Administered 2014-05-13: 20 mg via INTRAMUSCULAR
  Filled 2014-05-13: qty 2

## 2014-05-13 NOTE — Discharge Instructions (Signed)
Abdominal Pain °Many things can cause abdominal pain. Usually, abdominal pain is not caused by a disease and will improve without treatment. It can often be observed and treated at home. Your health care provider will do a physical exam and possibly order blood tests and X-rays to help determine the seriousness of your pain. However, in many cases, more time must pass before a clear cause of the pain can be found. Before that point, your health care provider may not know if you need more testing or further treatment. °HOME CARE INSTRUCTIONS  °Monitor your abdominal pain for any changes. The following actions may help to alleviate any discomfort you are experiencing: °· Only take over-the-counter or prescription medicines as directed by your health care provider. °· Do not take laxatives unless directed to do so by your health care provider. °· Try a clear liquid diet (broth, tea, or water) as directed by your health care provider. Slowly move to a bland diet as tolerated. °SEEK MEDICAL CARE IF: °· You have unexplained abdominal pain. °· You have abdominal pain associated with nausea or diarrhea. °· You have pain when you urinate or have a bowel movement. °· You experience abdominal pain that wakes you in the night. °· You have abdominal pain that is worsened or improved by eating food. °· You have abdominal pain that is worsened with eating fatty foods. °· You have a fever. °SEEK IMMEDIATE MEDICAL CARE IF:  °· Your pain does not go away within 2 hours. °· You keep throwing up (vomiting). °· Your pain is felt only in portions of the abdomen, such as the right side or the left lower portion of the abdomen. °· You pass bloody or black tarry stools. °MAKE SURE YOU: °· Understand these instructions.   °· Will watch your condition.   °· Will get help right away if you are not doing well or get worse.   °Document Released: 06/10/2005 Document Revised: 09/05/2013 Document Reviewed: 05/10/2013 °[employer]® Patient Information  ©2015 [employer]. This information is not intended to replace advice given to you by your health care provider. Make sure you discuss any questions you have with your health care provider. ° °Constipation °Constipation is when a person has fewer than three bowel movements a week, has difficulty having a bowel movement, or has stools that are dry, hard, or larger than normal. As people grow older, constipation is more common. If you try to fix constipation with medicines that make you have a bowel movement (laxatives), the problem may get worse. Long-term laxative use may cause the muscles of the colon to become weak. A low-fiber diet, not taking in enough fluids, and taking certain medicines may make constipation worse.  °CAUSES  °· Certain medicines, such as antidepressants, pain medicine, iron supplements, antacids, and water pills.   °· Certain diseases, such as diabetes, irritable bowel syndrome (IBS), thyroid disease, or depression.   °· Not drinking enough water.   °· Not eating enough fiber-rich foods.   °· Stress or travel.   °· Lack of physical activity or exercise.   °· Ignoring the urge to have a bowel movement.   °· Using laxatives too much.   °SIGNS AND SYMPTOMS  °· Having fewer than three bowel movements a week.   °· Straining to have a bowel movement.   °· Having stools that are hard, dry, or larger than normal.   °· Feeling full or bloated.   °· Pain in the lower abdomen.   °· Not feeling relief after having a bowel movement.   °DIAGNOSIS  °Your health care provider will take   a medical history and perform a physical exam. Further testing may be done for severe constipation. Some tests may include: °· A barium enema X-ray to examine your rectum, colon, and, sometimes, your small intestine.   °· A sigmoidoscopy to examine your lower colon.   °· A colonoscopy to examine your entire colon. °TREATMENT  °Treatment will depend on the severity of your constipation and what is causing it. Some dietary  treatments include drinking more fluids and eating more fiber-rich foods. Lifestyle treatments may include regular exercise. If these diet and lifestyle recommendations do not help, your health care provider may recommend taking over-the-counter laxative medicines to help you have bowel movements. Prescription medicines may be prescribed if over-the-counter medicines do not work.  °HOME CARE INSTRUCTIONS  °· Eat foods that have a lot of fiber, such as fruits, vegetables, whole grains, and beans. °· Limit foods high in fat and processed sugars, such as french fries, hamburgers, cookies, candies, and soda.   °· A fiber supplement may be added to your diet if you cannot get enough fiber from foods.   °· Drink enough fluids to keep your urine clear or pale yellow.   °· Exercise regularly or as directed by your health care provider.   °· Go to the restroom when you have the urge to go. Do not hold it.   °· Only take over-the-counter or prescription medicines as directed by your health care provider. Do not take other medicines for constipation without talking to your health care provider first.   °SEEK IMMEDIATE MEDICAL CARE IF:  °· You have bright red blood in your stool.   °· Your constipation lasts for more than 4 days or gets worse.   °· You have abdominal or rectal pain.   °· You have thin, pencil-like stools.   °· You have unexplained weight loss. °MAKE SURE YOU:  °· Understand these instructions. °· Will watch your condition. °· Will get help right away if you are not doing well or get worse. °Document Released: 05/29/2004 Document Revised: 09/05/2013 Document Reviewed: 06/12/2013 °[employer]® Patient Information ©2015 [employer]. This information is not intended to replace advice given to you by your health care provider. Make sure you discuss any questions you have with your health care provider. ° °

## 2014-05-13 NOTE — ED Provider Notes (Signed)
CSN: [id number]     Arrival date & time 05/13/14  1614 History   First MD Initiated Contact with Patient 05/13/14 1830     Chief Complaint  Patient presents with  . Abdominal Pain  . Chest Pain     (Consider location/radiation/quality/duration/timing/severity/associated sxs/prior Treatment) HPI Comments: The patient presents to ER for evaluation of abdominal pain. Patient reports that he started to have upper abdominal discomfort yesterday that went away when he took Pepto-Bismol. Today, however, the pain came back it has progressively worsened. He is experiencing diffuse abdominal pain which is severe and cramping. It is constant, worse is if you touch his abdomen. He feels distended. He has not had any nausea, vomiting or diarrhea. He did have a bowel movement today. Patient reports he was recently seen in the ER approximately a month ago for constipation. He has been seeing gastroenterology for this and is currently on MiraLax.  Patient is a 68 y.o. male presenting with abdominal pain and chest pain.  Abdominal Pain Associated symptoms: chest pain   Chest Pain Associated symptoms: abdominal pain     Past Medical History  Diagnosis Date  . Diabetes mellitus without complication   . Hypertension   . GERD (gastroesophageal reflux disease)   . Chronic back pain   . CAD (coronary artery disease)    Past Surgical History  Procedure Laterality Date  . Appendectomy    . Cardiac surgery      3 stent placed.   Family History  Problem Relation Age of Onset  . Diabetes Sister    History  Substance Use Topics  . Smoking status: Former Smoker -- 8 years    Quit date: 07/27/2011  . Smokeless tobacco: Never Used  . Alcohol Use: No    Review of Systems  Cardiovascular: Positive for chest pain.  Gastrointestinal: Positive for abdominal pain.  All other systems reviewed and are negative.     Allergies  Review of patient's allergies indicates no active allergies.  Home  Medications   Prior to Admission medications   Medication Sig Start Date End Date Taking? Authorizing Provider  aspirin 81 MG tablet Take 81 mg by mouth daily.    Historical Provider, MD  atorvastatin (LIPITOR) 40 MG tablet Take 20 mg by mouth daily.    Historical Provider, MD  azelastine (ASTELIN) 0.1 % nasal spray Place 2 sprays into both nostrils 2 (two) times daily. Use in each nostril as directed    Historical Provider, MD  Canagliflozin (INVOKANA) 300 MG TABS Take 150-300 mg by mouth daily.    Historical Provider, MD  doxycycline (VIBRAMYCIN) 100 MG capsule Take 100 mg by mouth 2 (two) times daily.    Historical Provider, MD  enalapril (VASOTEC) 20 MG tablet Take 20 mg by mouth daily.    Historical Provider, MD  fluticasone (FLONASE) 50 MCG/ACT nasal spray Place 1 spray into both nostrils at bedtime.    Historical Provider, MD  glipiZIDE (GLUCOTROL) 5 MG tablet Take 5 mg by mouth 2 (two) times daily before a meal.    Historical Provider, MD  Magnesium 250 MG TABS Take 250 mg by mouth daily.    Historical Provider, MD  magnesium citrate SOLN Take 296 mLs (1 Bottle total) by mouth once. 04/09/14   Carmin Muskrat, MD  metFORMIN (GLUCOPHAGE) 500 MG tablet Take 500 mg by mouth 2 (two) times daily with a meal.    Historical Provider, MD  metoprolol succinate (TOPROL-XL) 100 MG 24 hr tablet Take 100 mg  by mouth daily. Take with or immediately following a meal.    Historical Provider, MD  montelukast (SINGULAIR) 10 MG tablet Take 10 mg by mouth at bedtime.    Historical Provider, MD  nitroGLYCERIN (NITROSTAT) 0.4 MG SL tablet Place 0.4 mg under the tongue every 5 (five) minutes as needed for chest pain.    Historical Provider, MD  Omega-3 Fatty Acids (FISH OIL) 1000 MG CAPS Take 1,000 mg by mouth 2 (two) times daily.    Historical Provider, MD  pantoprazole (PROTONIX) 40 MG tablet Take 40 mg by mouth daily.    Historical Provider, MD  senna-docusate (SENOKOT-S) 8.6-50 MG per tablet Take 2 tablets  by mouth daily. 04/09/14   Carmin Muskrat, MD   BP 120/77  Pulse 71  Temp(Src) 99.5 F (37.5 C) (Oral)  Resp 16  Ht 5\' 10"  (1.778 m)  Wt 190 lb (86.183 kg)  BMI 27.26 kg/m2  SpO2 98% Physical Exam  Constitutional: He is oriented to person, place, and time. He appears well-developed and well-nourished. No distress.  HENT:  Head: Normocephalic and atraumatic.  Right Ear: Hearing normal.  Left Ear: Hearing normal.  Nose: Nose normal.  Mouth/Throat: Oropharynx is clear and moist and mucous membranes are normal.  Eyes: Conjunctivae and EOM are normal. Pupils are equal, round, and reactive to light.  Neck: Normal range of motion. Neck supple.  Cardiovascular: Regular rhythm, S1 normal and S2 normal.  Exam reveals no gallop and no friction rub.   No murmur heard. Pulmonary/Chest: Effort normal and breath sounds normal. No respiratory distress. He exhibits no tenderness.  Abdominal: He exhibits distension. Bowel sounds are decreased. There is no hepatosplenomegaly. There is generalized tenderness. There is no rebound, no guarding, no tenderness at McBurney's point and negative Murphy's sign. No hernia.  Musculoskeletal: Normal range of motion.  Neurological: He is alert and oriented to person, place, and time. He has normal strength. No cranial nerve deficit or sensory deficit. Coordination normal. GCS eye subscore is 4. GCS verbal subscore is 5. GCS motor subscore is 6.  Skin: Skin is warm, dry and intact. No rash noted. No cyanosis.  Psychiatric: He has a normal mood and affect. His speech is normal and behavior is normal. Thought content normal.    ED Course  Procedures (including critical care time) Labs Review Labs Reviewed  COMPREHENSIVE METABOLIC PANEL - Abnormal; Notable for the following:    Glucose, Bld 110 (*)    GFR calc non Af Amer 85 (*)    All other components within normal limits  CBC WITH DIFFERENTIAL  LIPASE, BLOOD  URINALYSIS, ROUTINE W REFLEX MICROSCOPIC  I-STAT  TROPOININ, ED    Imaging Review No results found.   EKG Interpretation   Date/Time:  Sunday May 13 2014 16:20:51 EDT Ventricular Rate:  67 PR Interval:  172 QRS Duration: 84 QT Interval:  404 QTC Calculation: 426 R Axis:   19 Text Interpretation:  Normal sinus rhythm Normal ECG Confirmed by Nathanyl Andujo   MD, Isidoro Santillana ([id number]) on 05/13/2014 6:34:07 PM      MDM   Final diagnoses:  None  Abdominal Pain Constipation   Patient presents today for evaluation of abdominal pain. Patient started with upper abdominal pain, burning in sensation. It radiated up into the chest. This seems to have moved down into the lower abdomen now. Patient has diffuse tenderness and mild distention. Vital signs are stable. Patient's blood work was entirely unremarkable. He does have a history of constipation, plain film x-ray did  show a moderate stone burden. I did reevaluate the patient, however, he was complaining of severe pain still. Although he had a CAT scan a month ago, he felt it was necessary to repeat the scan to further evaluate this new pain. CAT scan did not show any acute abnormality, however.  I discussed the findings with the patient. There is no acute emergency present. He does not require surgical evaluation or intervention. Patient ministered additional analgesics and antispasmodics. He'll be discharged with prep regimen.    Orpah Greek, MD 05/13/14 2218

## 2014-05-13 NOTE — ED Notes (Signed)
He states hes had upper abd pain radiating into his chest today. He had the pain yesterday and it was relieved with pepto bismol but returned today. Denies b/v/bowel/bladder changes

## 2014-05-15 ENCOUNTER — Encounter: Payer: Self-pay | Admitting: Internal Medicine

## 2014-05-25 ENCOUNTER — Other Ambulatory Visit: Payer: Self-pay | Admitting: Internal Medicine

## 2014-05-25 MED ORDER — LUBIPROSTONE 24 MCG PO CAPS: 24.0000 ug | ORAL_CAPSULE | Freq: Two times a day (BID) | ORAL | Status: DC

## 2014-06-01 ENCOUNTER — Other Ambulatory Visit: Payer: Self-pay | Admitting: *Deleted

## 2014-06-01 MED ORDER — ATORVASTATIN CALCIUM 40 MG PO TABS: 20.0000 mg | ORAL_TABLET | Freq: Every day | ORAL | Status: DC

## 2014-06-04 ENCOUNTER — Other Ambulatory Visit: Payer: Self-pay | Admitting: *Deleted

## 2014-06-04 MED ORDER — GLIPIZIDE 5 MG PO TABS: ORAL_TABLET | ORAL | Status: DC

## 2014-06-29 ENCOUNTER — Encounter: Payer: Self-pay | Admitting: Internal Medicine

## 2014-07-05 ENCOUNTER — Ambulatory Visit (INDEPENDENT_AMBULATORY_CARE_PROVIDER_SITE_OTHER): Payer: BC Managed Care – PPO | Admitting: Cardiology

## 2014-07-05 ENCOUNTER — Encounter: Payer: Self-pay | Admitting: Cardiology

## 2014-07-05 VITALS — BP 128/76 | HR 70 | Ht 70.0 in | Wt 197.0 lb

## 2014-07-05 DIAGNOSIS — I2583 Coronary atherosclerosis due to lipid rich plaque: Principal | ICD-10-CM

## 2014-07-05 DIAGNOSIS — I10 Essential (primary) hypertension: Secondary | ICD-10-CM

## 2014-07-05 DIAGNOSIS — E785 Hyperlipidemia, unspecified: Secondary | ICD-10-CM

## 2014-07-05 DIAGNOSIS — R5382 Chronic fatigue, unspecified: Secondary | ICD-10-CM

## 2014-07-05 DIAGNOSIS — I2510 Atherosclerotic heart disease of native coronary artery without angina pectoris: Secondary | ICD-10-CM

## 2014-07-05 MED ORDER — FUROSEMIDE 20 MG PO TABS: 20.0000 mg | ORAL_TABLET | Freq: Every day | ORAL | Status: DC | PRN

## 2014-07-05 NOTE — Patient Instructions (Signed)
The current medical regimen is effective;  continue present plan and medications. Please take Furosemide 20 mg for the next 3 days, then as needed.  Follow in 4 months with Dr Marlou Porch.

## 2014-07-05 NOTE — Progress Notes (Signed)
[facility] [address], [address], [place]  [address] Phone: [phone] Fax:  [phone]  Date:  07/05/2014   ID:  Michael Guzman, Michael Guzman [birth year], MRN [id number]  PCP:  Michael Richards, MD   History of Present Illness: Michael Guzman is a 68 y.o. male with coronary artery disease 10/13 drug eluting stent to mid LAD and OM2, normal ejection fraction with recurrent chest discomfort, repeat catheterization reassuring.  Has no energy after performing strenuous activity. Lifting heavy object, stairs, tired for the rest of the day. He has been offered sleep study in the past but does not wish to perform study. He has had a couple visits to the emergency room with constipation. He was in fishing tournament and did very well.   Considering retiring. [place].. Left lower quadrant pain. Colonoscopy normal.   As long as he keeps bowels loose, dosen't bother him.   Weather been bad, feels worn out at times. Left abdominal pain, bubble like when eating. Occasionally will start in his left upper abdomen and radiates to his left shoulder. He has had workup for this.    Wt Readings from Last 3 Encounters:  07/05/14 197 lb (89.359 kg)  05/13/14 190 lb (86.183 kg)  04/09/14 190 lb (86.183 kg)     Past Medical History  Diagnosis Date  . Diabetes mellitus without complication   . Hypertension   . GERD (gastroesophageal reflux disease)   . Chronic back pain   . CAD (coronary artery disease)     Past Surgical History  Procedure Laterality Date  . Appendectomy    . Cardiac surgery      3 stent placed.    Current Outpatient Prescriptions  Medication Sig Dispense Refill  . aspirin 81 MG tablet Take 81 mg by mouth daily.      Marland Kitchen atorvastatin (LIPITOR) 40 MG tablet Take 0.5 tablets (20 mg total) by mouth daily.  45 tablet  0  . azelastine (ASTELIN) 0.1 % nasal spray Place 2 sprays into both nostrils 2 (two) times daily. Use in each nostril as directed      .  Canagliflozin (INVOKANA) 300 MG TABS Take 150 mg by mouth daily.       Marland Kitchen dicyclomine (BENTYL) 20 MG tablet Take 1 tablet (20 mg total) by mouth 2 (two) times daily.  20 tablet  0  . enalapril (VASOTEC) 20 MG tablet Take 20 mg by mouth daily.      . fluticasone (FLONASE) 50 MCG/ACT nasal spray Place 1 spray into both nostrils at bedtime.      Marland Kitchen glipiZIDE (GLUCOTROL) 5 MG tablet Take 1 by mouth 3 times daily for glucose over 200 or as directed by your doctor  270 tablet  3  . lactulose (CHRONULAC) 10 GM/15ML solution Take 30 mLs (20 g total) by mouth 2 (two) times daily as needed for mild constipation or moderate constipation.  240 mL  0  . lubiprostone (AMITIZA) 24 MCG capsule Take 1 capsule (24 mcg total) by mouth 2 (two) times daily with a meal.  30 capsule  5  . Magnesium 250 MG TABS Take 250 mg by mouth daily.      . metFORMIN (GLUCOPHAGE) 500 MG tablet Take 500 mg by mouth 2 (two) times daily with a meal.      . metoCLOPramide (REGLAN) 10 MG tablet Take 10 mg by mouth daily as needed. For nausea      . metoprolol succinate (TOPROL-XL)  100 MG 24 hr tablet Take 100 mg by mouth daily. Take with or immediately following a meal.      . montelukast (SINGULAIR) 10 MG tablet Take 10 mg by mouth at bedtime.      . nitroGLYCERIN (NITROSTAT) 0.4 MG SL tablet Place 0.4 mg under the tongue every 5 (five) minutes as needed for chest pain.      . Omega-3 Fatty Acids (FISH OIL) 1000 MG CAPS Take 1,000 mg by mouth 2 (two) times daily.      Marland Kitchen OVER THE COUNTER MEDICATION probotic 1 tablet daily      . pantoprazole (PROTONIX) 40 MG tablet Take 40 mg by mouth daily.       No current facility-administered medications for this visit.    Allergies:    No Known Allergies  Social History:  The patient  reports that he quit smoking about 2 years ago. He has never used smokeless tobacco. He reports that he does not drink alcohol or use illicit drugs.   ROS:  Please see the history of present illness.   Denies any  fevers, chills, orthopnea, PND. He feels sinus congestion, run down at times.    PHYSICAL EXAM: VS:  BP 128/76  Pulse 70  Ht 5\' 10"  (1.778 m)  Wt 197 lb (89.359 kg)  BMI 28.27 kg/m2 Well nourished, well developed, in no acute distress HEENT: normal Neck: no JVD Cardiac:  normal S1, S2; RRR; no murmur Lungs:  clear to auscultation bilaterally, no wheezing, rhonchi or rales Abd: soft, nontender, no hepatomegalyoverweight. Ext: trace LE edema Skin: warm and dry Neuro: no focal abnormalities noted  EKG:  No EKG today.  ASSESSMENT AND PLAN:  1. Coronary artery disease-postintervention to LAD, OM. Doing well. No anginal symptoms.I will continue with ASA. No evidence of bleeding. His fatigue, some shortness of breath at times, transient especially after stairs, lifting heavy objects, I will trial low-dose Lasix 20 mg over the next 2-3 days. He also takes [place] which is causing a diuretic effect he states. I also suggested sleep study. 2. Abdominal discomfort-sounds like possible left transverse colonic distention that may be irritating his diaphragm causing radiation of pain into his left shoulder. As long as he keeps his bowels normal, he feels well. Continue with Metamucil/stool softener. Gastroenterology has been seeing him. 3. Hypertension- Bystolic was quite expensive for him. Tolerating metoprolol succinate 100 mg once a day well. Beta blocker could be contributing to her fatigue as well. 4. Sinus congestion-continuing to try Mucinex/Allegra. 5. Hyperlipidemia-LDL calculated is 30, direct is 45. I decreased his atorvastatin to 20 mg. Recheck lipid profile in one year. 6. Hypertension-good control. 7. I will see him back in 4 months.  Signed, Michael Furbish, MD [facility]  07/05/2014 3:10 PM

## 2014-07-13 ENCOUNTER — Other Ambulatory Visit: Payer: Self-pay | Admitting: Internal Medicine

## 2014-07-16 ENCOUNTER — Other Ambulatory Visit: Payer: Self-pay | Admitting: *Deleted

## 2014-07-16 MED ORDER — ENALAPRIL MALEATE 20 MG PO TABS: 20.0000 mg | ORAL_TABLET | Freq: Every day | ORAL | Status: DC

## 2014-07-16 MED ORDER — METFORMIN HCL ER 500 MG PO TB24: ORAL_TABLET | ORAL | Status: DC

## 2014-07-19 ENCOUNTER — Encounter: Payer: Self-pay | Admitting: Internal Medicine

## 2014-07-24 ENCOUNTER — Ambulatory Visit (INDEPENDENT_AMBULATORY_CARE_PROVIDER_SITE_OTHER): Payer: BC Managed Care – PPO | Admitting: Internal Medicine

## 2014-07-24 ENCOUNTER — Encounter: Payer: Self-pay | Admitting: Internal Medicine

## 2014-07-24 VITALS — BP 120/82 | HR 72 | Temp 97.5°F | Resp 16 | Ht 68.5 in | Wt 198.4 lb

## 2014-07-24 DIAGNOSIS — E1122 Type 2 diabetes mellitus with diabetic chronic kidney disease: Secondary | ICD-10-CM

## 2014-07-24 DIAGNOSIS — Z1212 Encounter for screening for malignant neoplasm of rectum: Secondary | ICD-10-CM

## 2014-07-24 DIAGNOSIS — N189 Chronic kidney disease, unspecified: Secondary | ICD-10-CM

## 2014-07-24 DIAGNOSIS — Z9861 Coronary angioplasty status: Secondary | ICD-10-CM

## 2014-07-24 DIAGNOSIS — E559 Vitamin D deficiency, unspecified: Secondary | ICD-10-CM

## 2014-07-24 DIAGNOSIS — I10 Essential (primary) hypertension: Secondary | ICD-10-CM

## 2014-07-24 DIAGNOSIS — R6889 Other general symptoms and signs: Secondary | ICD-10-CM

## 2014-07-24 DIAGNOSIS — E782 Mixed hyperlipidemia: Secondary | ICD-10-CM

## 2014-07-24 DIAGNOSIS — Z0001 Encounter for general adult medical examination with abnormal findings: Secondary | ICD-10-CM

## 2014-07-24 DIAGNOSIS — R7989 Other specified abnormal findings of blood chemistry: Secondary | ICD-10-CM

## 2014-07-24 DIAGNOSIS — Z113 Encounter for screening for infections with a predominantly sexual mode of transmission: Secondary | ICD-10-CM

## 2014-07-24 DIAGNOSIS — Z125 Encounter for screening for malignant neoplasm of prostate: Secondary | ICD-10-CM

## 2014-07-24 DIAGNOSIS — Z79899 Other long term (current) drug therapy: Secondary | ICD-10-CM

## 2014-07-24 DIAGNOSIS — R945 Abnormal results of liver function studies: Secondary | ICD-10-CM

## 2014-07-24 DIAGNOSIS — I2510 Atherosclerotic heart disease of native coronary artery without angina pectoris: Secondary | ICD-10-CM

## 2014-07-24 LAB — CBC WITH DIFFERENTIAL/PLATELET
BASOS PCT: 1 % (ref 0–1)
Basophils Absolute: 0.1 10*3/uL (ref 0.0–0.1)
EOS ABS: 0.4 10*3/uL (ref 0.0–0.7)
Eosinophils Relative: 4 % (ref 0–5)
HCT: 40.8 % (ref 39.0–52.0)
Hemoglobin: 13.6 g/dL (ref 13.0–17.0)
Lymphocytes Relative: 29 % (ref 12–46)
Lymphs Abs: 2.6 10*3/uL (ref 0.7–4.0)
MCH: 28.6 pg (ref 26.0–34.0)
MCHC: 33.3 g/dL (ref 30.0–36.0)
MCV: 85.7 fL (ref 78.0–100.0)
MONO ABS: 0.8 10*3/uL (ref 0.1–1.0)
Monocytes Relative: 9 % (ref 3–12)
NEUTROS PCT: 57 % (ref 43–77)
Neutro Abs: 5.1 10*3/uL (ref 1.7–7.7)
Platelets: 268 10*3/uL (ref 150–400)
RBC: 4.76 MIL/uL (ref 4.22–5.81)
RDW: 13.1 % (ref 11.5–15.5)
WBC: 8.9 10*3/uL (ref 4.0–10.5)

## 2014-07-24 MED ORDER — PREDNISONE 20 MG PO TABS: ORAL_TABLET | ORAL | Status: DC

## 2014-07-24 NOTE — Patient Instructions (Signed)
 Recommend the book "The END of DIETING" by Dr Joel Furman   and the book "The END of DIABETES " by Dr Joel Fuhrman  At [employer] - get book & Audio CD's      Being diabetic has a  300% increased risk for heart attack, stroke, cancer, and alzheimer- type vascular dementia. It is very important that you work harder with diet by avoiding all foods that are white except chicken & fish. Avoid white rice (brown & wild rice is OK), white potatoes (sweetpotatoes in moderation is OK), White bread or wheat bread or anything made out of white flour like bagels, donuts, rolls, buns, biscuits, cakes, pastries, cookies, pizza crust, and pasta (made from white flour & egg whites) - vegetarian pasta or spinach or wheat pasta is OK. Multigrain breads like Arnold's or [employer], or multigrain sandwich thins or flatbreads.  Diet, exercise and weight loss can reverse and cure diabetes in the early stages.  Diet, exercise and weight loss is very important in the control and prevention of complications of diabetes which affects every system in your body, ie. Brain - dementia/stroke, eyes - glaucoma/blindness, heart - heart attack/heart failure, kidneys - dialysis, stomach - gastric paralysis, intestines - malabsorption, nerves - severe painful neuritis, circulation - gangrene & loss of a leg(s), and finally cancer and Alzheimers.    I recommend avoid fried & greasy foods,  sweets/candy, white rice (brown or wild rice or Quinoa is OK), white potatoes (sweet potatoes are OK) - anything made from white flour - bagels, doughnuts, rolls, buns, biscuits,white and wheat breads, pizza crust and traditional pasta made of white flour & egg white(vegetarian pasta or spinach or wheat pasta is OK).  Multi-grain bread is OK - like multi-grain flat bread or sandwich thins. Avoid alcohol in excess. Exercise is also important.    Eat all the vegetables you want - avoid meat, especially red meat and dairy - especially cheese.  Cheese  is the most concentrated form of trans-fats which is the worst thing to clog up our arteries. Veggie cheese is OK which can be found in the fresh produce section at [employer] or [employer] or [employer]  Preventive Care for Adults A healthy lifestyle and preventive care can promote health and wellness. Preventive health guidelines for men include the following key practices:  A routine yearly physical is a good way to check with your health care provider about your health and preventative screening. It is a chance to share any concerns and updates on your health and to receive a thorough exam.  Visit your dentist for a routine exam and preventative care every 6 months. Brush your teeth twice a day and floss once a day. Good oral hygiene prevents tooth decay and gum disease.  The frequency of eye exams is based on your age, health, family medical history, use of contact lenses, and other factors. Follow your health care provider's recommendations for frequency of eye exams.  Eat a healthy diet. Foods such as vegetables, fruits, whole grains, low-fat dairy products, and lean protein foods contain the nutrients you need without too many calories. Decrease your intake of foods high in solid fats, added sugars, and salt. Eat the right amount of calories for you.Get information about a proper diet from your health care provider, if necessary.  Regular physical exercise is one of the most important things you can do for your health. Most adults should get at least 150 minutes of moderate-intensity exercise (any activity that   increases your heart rate and causes you to sweat) each week. In addition, most adults need muscle-strengthening exercises on 2 or more days a week.  Maintain a healthy weight. The body mass index (BMI) is a screening tool to identify possible weight problems. It provides an estimate of body fat based on height and weight. Your health care provider can find your BMI and can help you  achieve or maintain a healthy weight.For adults 20 years and older:  A BMI below 18.5 is considered underweight.  A BMI of 18.5 to 24.9 is normal.  A BMI of 25 to 29.9 is considered overweight.  A BMI of 30 and above is considered obese.  Maintain normal blood lipids and cholesterol levels by exercising and minimizing your intake of saturated fat. Eat a balanced diet with plenty of fruit and vegetables. Blood tests for lipids and cholesterol should begin at age 20 and be repeated every 5 years. If your lipid or cholesterol levels are high, you are over 50, or you are at high risk for heart disease, you may need your cholesterol levels checked more frequently.Ongoing high lipid and cholesterol levels should be treated with medicines if diet and exercise are not working.  If you smoke, find out from your health care provider how to quit. If you do not use tobacco, do not start.  Lung cancer screening is recommended for adults aged 55-80 years who are at high risk for developing lung cancer because of a history of smoking. A yearly low-dose CT scan of the lungs is recommended for people who have at least a 30-pack-year history of smoking and are a current smoker or have quit within the past 15 years. A pack year of smoking is smoking an average of 1 pack of cigarettes a day for 1 year (for example: 1 pack a day for 30 years or 2 packs a day for 15 years). Yearly screening should continue until the smoker has stopped smoking for at least 15 years. Yearly screening should be stopped for people who develop a health problem that would prevent them from having lung cancer treatment.  If you choose to drink alcohol, do not have more than 2 drinks per day. One drink is considered to be 12 ounces (355 mL) of beer, 5 ounces (148 mL) of wine, or 1.5 ounces (44 mL) of liquor.  Avoid use of street drugs. Do not share needles with anyone. Ask for help if you need support or instructions about stopping the use of  drugs.  High blood pressure causes heart disease and increases the risk of stroke. Your blood pressure should be checked at least every 1-2 years. Ongoing high blood pressure should be treated with medicines, if weight loss and exercise are not effective.  If you are 45-79 years old, ask your health care provider if you should take aspirin to prevent heart disease.  Diabetes screening involves taking a blood sample to check your fasting blood sugar level. Testing should be considered at a younger age or be carried out more frequently if you are overweight and have at least 1 risk factor for diabetes.  Colorectal cancer can be detected and often prevented. Most routine colorectal cancer screening begins at the age of 50 and continues through age 75. However, your health care provider may recommend screening at an earlier age if you have risk factors for colon cancer. On a yearly basis, your health care provider may provide home test kits to check for hidden blood in   the stool. Use of a small camera at the end of a tube to directly examine the colon (sigmoidoscopy or colonoscopy) can detect the earliest forms of colorectal cancer. Talk to your health care provider about this at age 50, when routine screening begins. Direct exam of the colon should be repeated every 5-10 years through age 75, unless early forms of precancerous polyps or small growths are found.  Hepatitis C blood testing is recommended for all people born from 1945 through 1965 and any individual with known risks for hepatitis C.  Screening for abdominal aortic aneurysm (AAA)  by ultrasound is recommended for people who have history of high blood pressure or who are current or former smokers.  Healthy men should  receive prostate-specific antigen (PSA) blood tests as part of routine cancer screening. Talk with your health care provider about prostate cancer screening.  Testicular cancer screening is  recommended for adult males.  Screening includes self-exam, a health care provider exam, and other screening tests. Consult with your health care provider about any symptoms you have or any concerns you have about testicular cancer.  Use sunscreen. Apply sunscreen liberally and repeatedly throughout the day. You should seek shade when your shadow is shorter than you. Protect yourself by wearing long sleeves, pants, a wide-brimmed hat, and sunglasses year round, whenever you are outdoors.  Once a month, do a whole-body skin exam, using a mirror to look at the skin on your back. Tell your health care provider about new moles, moles that have irregular borders, moles that are larger than a pencil eraser, or moles that have changed in shape or color.  Stay current with required vaccines (immunizations).  Influenza vaccine. All adults should be immunized every year.  Tetanus, diphtheria, and acellular pertussis (Td, Tdap) vaccine. An adult who has not previously received Tdap or who does not know his vaccine status should receive 1 dose of Tdap. This initial dose should be followed by tetanus and diphtheria toxoids (Td) booster doses every 10 years. Adults with an unknown or incomplete history of completing a 3-dose immunization series with Td-containing vaccines should begin or complete a primary immunization series including a Tdap dose. Adults should receive a Td booster every 10 years.  Zoster vaccine. One dose is recommended for adults aged 60 years or older unless certain conditions are present.    PREVNAR - Pneumococcal 13-valent conjugate (PCV13) vaccine. When indicated, a person who is uncertain of his immunization history and has no record of immunization should receive the PCV13 vaccine. An adult aged 19 years or older who has certain medical conditions and has not been previously immunized should receive 1 dose of PCV13 vaccine. This PCV13 should be followed with a dose of pneumococcal polysaccharide (PPSV23) vaccine. The  PPSV23 vaccine dose should be obtained at least 8 weeks after the dose of PCV13 vaccine. An adult aged 19 years or older who has certain medical conditions and previously received 1 or more doses of PPSV23 vaccine should receive 1 dose of PCV13. The PCV13 vaccine dose should be obtained 1 or more years after the last PPSV23 vaccine dose.    PNEUMOVAX - Pneumococcal polysaccharide (PPSV23) vaccine. When PCV13 is also indicated, PCV13 should be obtained first. All adults aged 65 years and older should be immunized. An adult younger than age 65 years who has certain medical conditions should be immunized. Any person who resides in a nursing home or long-term care facility should be immunized. An adult smoker should be immunized. People   with an immunocompromised condition and certain other conditions should receive both PCV13 and PPSV23 vaccines. People with human immunodeficiency virus (HIV) infection should be immunized as soon as possible after diagnosis. Immunization during chemotherapy or radiation therapy should be avoided. Routine use of PPSV23 vaccine is not recommended for American Indians, [place] Natives, or people younger than 65 years unless there are medical conditions that require PPSV23 vaccine. When indicated, people who have unknown immunization and have no record of immunization should receive PPSV23 vaccine. One-time revaccination 5 years after the first dose of PPSV23 is recommended for people aged 19-64 years who have chronic kidney failure, nephrotic syndrome, asplenia, or immunocompromised conditions. People who received 1-2 doses of PPSV23 before age 65 years should receive another dose of PPSV23 vaccine at age 65 years or later if at least 5 years have passed since the previous dose. Doses of PPSV23 are not needed for people immunized with PPSV23 at or after age 65 years.    Hepatitis A vaccine. Adults who wish to be protected from this disease, have certain high-risk conditions, work  with hepatitis A-infected animals, work in hepatitis A research labs, or travel to or work in countries with a high rate of hepatitis A should be immunized. Adults who were previously unvaccinated and who anticipate close contact with an international adoptee during the first 60 days after arrival in the [place] from a country with a high rate of hepatitis A should be immunized.    Hepatitis B vaccine. Adults should be immunized if they wish to be protected from this disease, have certain high-risk conditions, may be exposed to blood or other infectious body fluids, are household contacts or sex partners of hepatitis B positive people, are clients or workers in certain care facilities, or travel to or work in countries with a high rate of hepatitis B.   Preventive Service / Frequency   Ages 65 and over  Blood pressure check.  Lipid and cholesterol check.  Lung cancer screening. / Every year if you are aged 55-80 years and have a 30-pack-year history of smoking and currently smoke or have quit within the past 15 years. Yearly screening is stopped once you have quit smoking for at least 15 years or develop a health problem that would prevent you from having lung cancer treatment.  Fecal occult blood test (FOBT) of stool. You may not have to do this test if you get a colonoscopy every 10 years.  Flexible sigmoidoscopy** or colonoscopy.** / Every 5 years for a flexible sigmoidoscopy or every 10 years for a colonoscopy beginning at age 50 and continuing until age 75.  Hepatitis C blood test.** / For all people born from 1945 through 1965 and any individual with known risks for hepatitis C.  Abdominal aortic aneurysm (AAA) screening./ Screening current or former smokers or have Hypertension.  Skin self-exam. / Monthly.  Influenza vaccine. / Every year.  Tetanus, diphtheria, and acellular pertussis (Tdap/Td) vaccine.** / 1 dose of Td every 10 years.   Zoster vaccine.** / 1 dose for  adults aged 60 years or older.         Pneumococcal 13-valent conjugate (PCV13) vaccine.    Pneumococcal polysaccharide (PPSV23) vaccine.     Hepatitis A vaccine.** / Consult your health care provider.  Hepatitis B vaccine.** / Consult your health care provider. Screening for abdominal aortic aneurysm (AAA)  by ultrasound is recommended for people who have history of high blood pressure or who are current or former smokers. 

## 2014-07-24 NOTE — Progress Notes (Signed)
Patient ID: Michael Guzman, male   DOB: June 19, 1946, 68 y.o.   MRN: [id number]  Annual Screening Comprehensive Examination  This very nice 68 y.o.MBM presents for complete physical.  Patient has been followed for HTN, T2_NIDDM  Prediabetes, Hyperlipidemia, and Vitamin D Deficiency. He also c/o ongoing and chronic nasal stuffiness and rhinorrhea.    HTN predates since 27. Patient's BP has been controlled at home.Today's BP: 120/82 mmHg. Patient has ASCAD and had a PTCA / DES in Oct 2013.  Patient denies any cardiac symptoms as chest pain, palpitations, shortness of breath, dizziness or ankle swelling. He does continue to c/o fatigue which he has for many years.   Patient's hyperlipidemia is controlled with diet and medications. Patient denies myalgias or other medication SE's. Last lipids were Total Chol 115; HDL 54; LDL 16; Trig 226 on 03/22/2014.   Patient has T2_NIDDM since 1998 w/CKD2 and patient denies reactive hypoglycemic symptoms, visual blurring, diabetic polys or paresthesias. Last A1c was 10.5% on  03/22/2014 and he was advised stricter diet, weight loss and increase his Glipizide to bid. He alleges his CBG's are running less than 140 mg%.      Finally, patient has history of Vitamin D Deficiency of 15 in 2008 and last vitamin D was  65 on  03/22/2014.  Medication Sig  . aspirin 81 MG tablet Take 81 mg by mouth daily.  Marland Kitchen atorvastatin (LIPITOR) 40 MG tablet Take 0.5 tablets (20 mg total) by mouth daily.  Marland Kitchen azelastine (ASTELIN) 0.1 % nasal spray Place 2 sprays into both nostrils 2 (two) times daily. Use in each nostril as directed  . Canagliflozin (INVOKANA) 300 MG TABS Take 150 mg by mouth daily.   Marland Kitchen dicyclomine (BENTYL) 20 MG tablet Take 1 tablet (20 mg total) by mouth 2 (two) times daily.  . enalapril (VASOTEC) 20 MG tablet Take 1 tablet (20 mg total) by mouth daily.  . fluticasone (FLONASE) 50 MCG/ACT nasal spray Place 1 spray into both nostrils at bedtime.  . furosemide (LASIX) 20 MG  tablet Take 1 tablet (20 mg total) by mouth daily as needed.  Marland Kitchen glipiZIDE (GLUCOTROL) 5 MG tablet Take 1 by mouth 3 times daily for glucose over 200 or as directed by your doctor  . lactulose (CHRONULAC) 10 GM/15ML solution Take 30 mLs (20 g total) by mouth 2 (two) times daily as needed for mild constipation or moderate constipation.  Marland Kitchen lubiprostone (AMITIZA) 24 MCG capsule Take 1 capsule (24 mcg total) by mouth 2 (two) times daily with a meal.  . Magnesium 250 MG TABS Take 250 mg by mouth daily.  . metFORMIN (GLUCOPHAGE-XR) 500 MG 24 hr tablet Take 1 to 2 tablets 2 x day as directed for Diabetes  . metoCLOPramide (REGLAN) 10 MG tablet Take 10 mg by mouth daily as needed. For nausea  . metoprolol succinate (TOPROL-XL) 100 MG 24 hr tablet Take 100 mg by mouth daily. Take with or immediately following a meal.  . montelukast (SINGULAIR) 10 MG tablet Take 10 mg by mouth at bedtime.  . nitroGLYCERIN (NITROSTAT) 0.4 MG SL tablet Place 0.4 mg under the tongue every 5 (five) minutes as needed for chest pain.  . Omega-3 Fatty Acids (FISH OIL) 1000 MG CAPS Take 1,000 mg by mouth 2 (two) times daily.  Marland Kitchen OVER THE COUNTER MEDICATION probotic 1 tablet daily  . pantoprazole (PROTONIX) 40 MG tablet Take 40 mg by mouth daily.   No Known Allergies  Past Medical History  Diagnosis Date  .  Diabetes mellitus without complication   . Hypertension   . GERD (gastroesophageal reflux disease)   . Chronic back pain   . CAD (coronary artery disease)    Health Maintenance  Topic Date Due  . FOOT EXAM  06/02/1957  . OPHTHALMOLOGY EXAM  06/02/1957  . INFLUENZA VACCINE  04/14/2014  . URINE MICROALBUMIN  07/19/2014  . HEMOGLOBIN A1C  09/22/2014  . COLONOSCOPY  02/13/2020  . TETANUS/TDAP  11/27/2023  . PNEUMOCOCCAL POLYSACCHARIDE VACCINE AGE 21 AND OVER  Completed  . ZOSTAVAX  Completed   Immunization History  Administered Date(s) Administered  . Influenza Split 06/26/2012  . Influenza-Unspecified 06/14/2013,  06/25/2014  . Pneumococcal-Unspecified 06/16/2010  . Td 09/17/2003  . Tdap 11/26/2013  . Zoster 07/12/2012   Past Surgical History  Procedure Laterality Date  . Appendectomy    . Cardiac surgery      3 stent placed.   Family History  Problem Relation Age of Onset  . Diabetes Sister    History   Social History  . Marital Status: Married    Spouse Name: N/A    Number of Children: 45  . Years of Education: 2 y colleg   Occupational History  . 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 Smoking status: Former Smoker -- 64 years    Quit date: 07/27/2011  . Smokeless tobacco: Never Used  . Alcohol Use: No  . Drug Use: No  . Sexual Activity: Not on file    ROS Constitutional: Denies fever, chills, weight loss/gain, headaches, insomnia,  night sweats or change in appetite. Does have undiagnosed chronic fatigue.  Eyes: Denies redness, blurred vision, diplopia, discharge, itchy or watery eyes.  ENT: Does c/o congestion, post nasal drip. Denies discharge, epistaxis, sore throat, earache, hearing loss, dental pain, Tinnitus, Vertigo, Sinus pain or snoring.  Cardio: Denies chest pain, palpitations, irregular heartbeat, syncope, dyspnea, diaphoresis, orthopnea, PND, claudication or edema Respiratory: denies cough, dyspnea, DOE, pleurisy, hoarseness, laryngitis or wheezing.  Gastrointestinal: Denies dysphagia, heartburn, reflux, water brash, pain, cramps, nausea, vomiting, bloating, diarrhea, constipation, hematemesis, melena, hematochezia, jaundice or hemorrhoids Genitourinary: Denies dysuria, frequency, urgency, nocturia, hesitancy, discharge, hematuria or flank pain Musculoskeletal: Denies arthralgia, myalgia, stiffness, Jt. Swelling, pain, limp or strain/sprain. Denies Falls. Skin: Denies puritis, rash, hives, warts, acne, eczema or change in skin lesion Neuro: No weakness, tremor, incoordination, spasms, paresthesia or pain Psychiatric: Denies confusion, memory  loss or sensory loss. Denies Depression. Endocrine: Denies change in weight, skin, hair change, nocturia, and paresthesia, diabetic polys, visual blurring or hyper / hypo glycemic episodes.  Heme/Lymph: No excessive bleeding, bruising or enlarged lymph nodes.  Physical Exam  BP 120/82 mmHg  Pulse 72  Temp(Src) 97.5 F (36.4 C)  Resp 16  Ht 5' 8.5" (1.74 m)  Wt 198 lb 6.4 oz (89.994 kg)  BMI 29.72 kg/m2  General Appearance: Well nourished, in no apparent distress. Eyes: PERRLA, EOMs, conjunctiva no swelling or erythema, normal fundi and vessels. Sinuses: No frontal/maxillary tenderness ENT/Mouth: EACs patent / TMs  nl. Nares clear without erythema, swelling, mucoid exudates. Oral hygiene is good. No erythema, swelling, or exudate. Tongue normal, non-obstructing. Tonsils not swollen or erythematous. Hearing normal.  Neck: Supple, thyroid normal. No bruits, nodes or JVD. Respiratory: Respiratory effort normal.  BS equal and clear bilateral without rales, rhonci, wheezing or stridor. Cardio: Heart sounds are normal with regular rate and rhythm and no murmurs, rubs or gallops. Peripheral pulses are normal and equal bilaterally without edema. No aortic  or femoral bruits. Chest: symmetric with normal excursions and percussion.  Abdomen: Flat, soft, with bowl sounds. Nontender, no guarding, rebound, hernias, masses, or organomegaly.  Lymphatics: Non tender without lymphadenopathy.  Genitourinary: No hernias.Testes nl. DRE - prostate nl for age - smooth & firm w/o nodules. Musculoskeletal: Full ROM all peripheral extremities, joint stability, 5/5 strength, and normal gait. Skin: Warm and dry without rashes, lesions, cyanosis, clubbing or  ecchymosis.  Neuro: Cranial nerves intact, reflexes equal bilaterally. Normal muscle tone, no cerebellar symptoms. Sensation intact.  Pysch: Awake and oriented X 3 with normal affect, insight and judgment appropriate.   Assessment and Plan  1. Annual  Screening Examination 2. Hypertension  3. Hyperlipidemia 4. T2_NIDDM w/CKD2 5. Vitamin D Deficiency 6. ASCAD s/p PTCA / DES   Continue prudent diet as discussed, weight control, BP monitoring, regular exercise, and medications as discussed.  Discussed med effects and SE's. Routine screening labs and tests as requested with regular follow-up as recommended.

## 2014-07-25 LAB — LIPID PANEL
Cholesterol: 103 mg/dL (ref 0–200)
HDL: 43 mg/dL (ref >39)
LDL Cholesterol: 28 mg/dL (ref 0–99)
Total CHOL/HDL Ratio: 2.4 Ratio
Triglycerides: 160 mg/dL — ABNORMAL HIGH (ref <150)
VLDL: 32 mg/dL (ref 0–40)

## 2014-07-25 LAB — TSH: TSH: 0.222 u[IU]/mL — ABNORMAL LOW (ref 0.350–4.500)

## 2014-07-25 LAB — RPR

## 2014-07-25 LAB — HEPATIC FUNCTION PANEL
ALT: 17 U/L (ref 0–53)
AST: 15 U/L (ref 0–37)
Albumin: 4.3 g/dL (ref 3.5–5.2)
Alkaline Phosphatase: 71 U/L (ref 39–117)
Bilirubin, Direct: 0.1 mg/dL (ref 0.0–0.3)
Indirect Bilirubin: 0.3 mg/dL (ref 0.2–1.2)
TOTAL PROTEIN: 6.9 g/dL (ref 6.0–8.3)
Total Bilirubin: 0.4 mg/dL (ref 0.2–1.2)

## 2014-07-25 LAB — MAGNESIUM: Magnesium: 1.9 mg/dL (ref 1.5–2.5)

## 2014-07-25 LAB — HEPATITIS B CORE ANTIBODY, TOTAL: Hep B Core Total Ab: NONREACTIVE

## 2014-07-25 LAB — BASIC METABOLIC PANEL WITH GFR
BUN: 12 mg/dL (ref 6–23)
CALCIUM: 9.7 mg/dL (ref 8.4–10.5)
CO2: 24 mEq/L (ref 19–32)
Chloride: 103 mEq/L (ref 96–112)
Creat: 0.87 mg/dL (ref 0.50–1.35)
GFR, Est Non African American: 89 mL/min
Glucose, Bld: 84 mg/dL (ref 70–99)
Potassium: 4.1 mEq/L (ref 3.5–5.3)
SODIUM: 140 meq/L (ref 135–145)

## 2014-07-25 LAB — PSA: PSA: 4.43 ng/mL — ABNORMAL HIGH (ref <=4.00)

## 2014-07-25 LAB — HIV ANTIBODY (ROUTINE TESTING W REFLEX): HIV 1&2 Ab, 4th Generation: NONREACTIVE

## 2014-07-25 LAB — URINALYSIS, MICROSCOPIC ONLY
Bacteria, UA: NONE SEEN
Casts: NONE SEEN
Crystals: NONE SEEN
SQUAMOUS EPITHELIAL / LPF: NONE SEEN

## 2014-07-25 LAB — MICROALBUMIN / CREATININE URINE RATIO
Creatinine, Urine: 62.2 mg/dL
MICROALB UR: 0.2 mg/dL (ref <2.0)
Microalb Creat Ratio: 3.2 mg/g (ref 0.0–30.0)

## 2014-07-25 LAB — TESTOSTERONE: TESTOSTERONE: 225 ng/dL — AB (ref 300–890)

## 2014-07-25 LAB — HEMOGLOBIN A1C
HEMOGLOBIN A1C: 8.1 % — AB (ref <5.7)
MEAN PLASMA GLUCOSE: 186 mg/dL — AB (ref <117)

## 2014-07-25 LAB — HEPATITIS C ANTIBODY: HCV Ab: NEGATIVE

## 2014-07-25 LAB — HEPATITIS A ANTIBODY, TOTAL: Hep A Total Ab: REACTIVE — AB

## 2014-07-25 LAB — URIC ACID: Uric Acid, Serum: 4.2 mg/dL (ref 4.0–7.8)

## 2014-07-25 LAB — HEPATITIS B SURFACE ANTIBODY,QUALITATIVE: HEP B S AB: NEGATIVE

## 2014-07-25 LAB — VITAMIN D 25 HYDROXY (VIT D DEFICIENCY, FRACTURES): VIT D 25 HYDROXY: 89 ng/mL (ref 30–89)

## 2014-07-25 LAB — VITAMIN B12: Vitamin B-12: 662 pg/mL (ref 211–911)

## 2014-07-25 LAB — INSULIN, FASTING: Insulin fasting, serum: 16.4 u[IU]/mL (ref 2.0–19.6)

## 2014-07-26 LAB — HEPATITIS B E ANTIBODY: Hepatitis Be Antibody: NONREACTIVE

## 2014-08-12 ENCOUNTER — Encounter: Payer: Self-pay | Admitting: Internal Medicine

## 2014-08-15 ENCOUNTER — Encounter: Payer: Self-pay | Admitting: Internal Medicine

## 2014-08-15 ENCOUNTER — Ambulatory Visit (INDEPENDENT_AMBULATORY_CARE_PROVIDER_SITE_OTHER): Payer: BC Managed Care – PPO | Admitting: Internal Medicine

## 2014-08-15 VITALS — BP 106/68 | HR 60 | Temp 97.9°F | Resp 16 | Ht 68.5 in | Wt 198.0 lb

## 2014-08-15 DIAGNOSIS — R972 Elevated prostate specific antigen [PSA]: Secondary | ICD-10-CM

## 2014-08-15 DIAGNOSIS — J309 Allergic rhinitis, unspecified: Secondary | ICD-10-CM

## 2014-08-15 MED ORDER — PREDNISONE (PAK) 10 MG PO TABS: ORAL_TABLET | Freq: Every day | ORAL | Status: DC

## 2014-08-15 NOTE — Patient Instructions (Signed)
  Start Prednisone 10 mg 2 x da for 5 days   Then   Take 1 x day for 5 days   Then  Take every other day  (Even days of month , ie 2-4-02-19-09-12 etc)

## 2014-08-15 NOTE — Progress Notes (Signed)
Subjective:    Patient ID: Michael Guzman, male    DOB: 1946-07-09, 68 y.o.   MRN: [id number]  HPI Patient has hx/o chronic allergic and perinneal rhino sinusitis and recently had significant improvement on a prednisone taper, but now after 4-5 day off of  treatment he's again symptomatic and feels "miserable" to the point he feels that he cannot function at work.    Medication Sig  . AMITIZA 24 MCG capsule   . aspirin 81 MG tablet Take 81 mg by mouth daily.  Marland Kitchen atorvastatin (LIPITOR) 40 MG tablet Take 0.5 tablets (20 mg total) by mouth daily.  Marland Kitchen azelastine (ASTELIN) 0.1 % nasal spray Place 2 sprays into both nostrils 2 (two) times daily. Use in each nostril as directed  . Canagliflozin (INVOKANA) 300 MG TABS Take 150 mg by mouth daily.   Marland Kitchen dicyclomine (BENTYL) 20 MG tablet Take 1 tablet (20 mg total) by mouth 2 (two) times daily.  . enalapril (VASOTEC) 20 MG tablet Take 1 tablet (20 mg total) by mouth daily.  . fluticasone (FLONASE) 50 MCG/ACT nasal spray Place 1 spray into both nostrils at bedtime.  . furosemide (LASIX) 20 MG tablet Take 1 tablet (20 mg total) by mouth daily as needed.  Marland Kitchen glipiZIDE (GLUCOTROL) 5 MG tablet Take 1 by mouth 3 times daily for glucose over 200 or as directed by your doctor  . Magnesium 250 MG TABS Take 250 mg by mouth daily.  . metFORMIN (GLUCOPHAGE-XR) 500 MG 24 hr tablet Take 1 to 2 tablets 2 x day as directed for Diabetes  . metoprolol succinate (TOPROL-XL) 100 MG 24 hr tablet Take 100 mg by mouth daily. Take with or immediately following a meal.  . montelukast (SINGULAIR) 10 MG tablet Take 10 mg by mouth at bedtime.  . nitroGLYCERIN (NITROSTAT) 0.4 MG SL tablet Place 0.4 mg under the tongue every 5 (five) minutes as needed for chest pain.  . Omega-3 Fatty Acids (FISH OIL) 1000 MG CAPS Take 1,000 mg by mouth 2 (two) times daily.  Marland Kitchen OVER THE COUNTER MEDICATION probotic 1 tablet daily  . pantoprazole (PROTONIX) 40 MG tablet Take 40 mg by mouth daily.  .  predniSONE (DELTASONE) 20 MG tablet 1 tab 3 x day for 3 days, then 1 tab 2 x day for 3 days, then 1 tab 1 x day for 5 days   No Known Allergies   Past Medical History  Diagnosis Date  . Diabetes mellitus without complication   . Hypertension   . GERD (gastroesophageal reflux disease)   . Chronic back pain   . CAD (coronary artery disease)    Review of Systems (+) and pertinent as above.    Objective:   Physical Exam BP 106/68 mmHg  Pulse 60  Temp(Src) 97.9 F (36.6 C)  Resp 16  Ht 5' 8.5" (1.74 m)  Wt 198 lb (89.812 kg)  BMI 29.66 kg/m2   HEENT - Eac's patent. TM's Nl. EOM's full. PERRLA. (+) tender frontal & maxillary areas. NasoOroPharynx clear. Neck - supple. Nl Thyroid. Carotids 2+ & No bruits, nodes, JVD Chest - Clear equal BS w/o Rales, rhonchi, wheezes. Cor - Nl HS. RRR w/o sig MGR. PP 1(+). No edema. Abd - No palpable organomegaly, masses or tenderness. BS nl. MS- FROM w/o deformities. Muscle power, tone and bulk Nl. Gait Nl. Neuro - No obvious Cr N abnormalities. Sensory, motor and Cerebellar functions appear Nl w/o focal abnormalities. Psyche - Mental status normal & appropriate.  No delusions, ideations or obvious mood abnormalities.     Assessment & Plan:   1. Allergic sinusitis  2. Allergic rhinitis   3. Elevated PSA  - PSA, total and free  - Have discussed with patient an empiric trial with low dose prednisone tapering to qod, then tapering dose to lowest that will control his sx's. Patient is aware of potential for exacerbation of glucose intolerance (as well as other possible long term SE's) and to monitor CBG's more frequently and also to monitor naso pulmonary secretions for purulence.   - Rx Prednisone 10 mg - continue gAllegra, Montelukast, Flonase and Astelin nasal. - ROV 1 month or prn

## 2014-08-16 LAB — PSA, TOTAL AND FREE
PSA, Free Pct: 8 % — ABNORMAL LOW (ref >25)
PSA, Free: 0.43 ng/mL
PSA: 5.12 ng/mL — AB (ref <=4.00)

## 2014-08-19 ENCOUNTER — Other Ambulatory Visit: Payer: Self-pay | Admitting: Internal Medicine

## 2014-08-19 DIAGNOSIS — R972 Elevated prostate specific antigen [PSA]: Secondary | ICD-10-CM

## 2014-08-23 ENCOUNTER — Encounter (HOSPITAL_COMMUNITY): Payer: Self-pay | Admitting: Cardiology

## 2014-08-24 ENCOUNTER — Other Ambulatory Visit: Payer: Self-pay

## 2014-08-24 MED ORDER — ATORVASTATIN CALCIUM 40 MG PO TABS: 20.0000 mg | ORAL_TABLET | Freq: Every day | ORAL | Status: DC

## 2014-09-04 ENCOUNTER — Other Ambulatory Visit: Payer: Self-pay | Admitting: *Deleted

## 2014-09-04 MED ORDER — PANTOPRAZOLE SODIUM 40 MG PO TBEC: 40.0000 mg | DELAYED_RELEASE_TABLET | Freq: Every day | ORAL | Status: DC

## 2014-09-21 ENCOUNTER — Ambulatory Visit (INDEPENDENT_AMBULATORY_CARE_PROVIDER_SITE_OTHER): Payer: BLUE CROSS/BLUE SHIELD | Admitting: Internal Medicine

## 2014-09-21 ENCOUNTER — Encounter: Payer: Self-pay | Admitting: Internal Medicine

## 2014-09-21 VITALS — BP 104/68 | HR 64 | Temp 97.7°F | Resp 16 | Ht 67.5 in | Wt 198.0 lb

## 2014-09-21 DIAGNOSIS — J309 Allergic rhinitis, unspecified: Secondary | ICD-10-CM

## 2014-09-21 DIAGNOSIS — M150 Primary generalized (osteo)arthritis: Secondary | ICD-10-CM

## 2014-09-21 DIAGNOSIS — M8949 Other hypertrophic osteoarthropathy, multiple sites: Secondary | ICD-10-CM

## 2014-09-21 DIAGNOSIS — M159 Polyosteoarthritis, unspecified: Secondary | ICD-10-CM

## 2014-09-21 MED ORDER — PREDNISONE (PAK) 10 MG PO TABS: ORAL_TABLET | Freq: Every day | ORAL | Status: DC

## 2014-09-21 NOTE — Progress Notes (Signed)
Subjective:    Patient ID: Michael Guzman, male    DOB: [birth year], 69 y.o.   MRN: [id number]  HPI  Patient reports his allergy sx's much improved as well as his arthralgias/myalgias. Still reports occasional "funny feeling" in his head "like head in a vise" And occas has sensation of sl imbalance , not clearly positional and usu lasts only a few seconds . Remains on multidrug allergy Rx otherwise & will continue.  Medication Sig  . AMITIZA 24 MCG capsule   . aspirin 81 MG tablet Take 81 mg by mouth daily.  Marland Kitchen atorvastatin (LIPITOR) 40 MG tablet Take 0.5 tablets (20 mg total) by mouth daily.  Marland Kitchen azelastine (ASTELIN) 0.1 % nasal spray Place 2 sprays into both nostrils 2 (two) times daily. Use in each nostril as directed  . Canagliflozin (INVOKANA) 300 MG TABS Take 150 mg by mouth daily.   . enalapril (VASOTEC) 20 MG tablet Take 1 tablet (20 mg total) by mouth daily.  . fluticasone (FLONASE) 50 MCG/ACT nasal spray Place 1 spray into both nostrils at bedtime.  . furosemide (LASIX) 20 MG tablet Take 1 tablet (20 mg total) by mouth daily as needed.  Marland Kitchen glipiZIDE (GLUCOTROL) 5 MG tablet Take 1 by mouth 3 times daily for glucose over 200 or as directed by your doctor  . Magnesium 250 MG TABS Take 250 mg by mouth daily.  . metFORMIN (GLUCOPHAGE-XR) 500 MG 24 hr tablet Take 1 to 2 tablets 2 x day as directed for Diabetes  . metoprolol succinate (TOPROL-XL) 100 MG 24 hr tablet Take 100 mg by mouth daily. Take with or immediately following a meal.  . montelukast (SINGULAIR) 10 MG tablet Take 10 mg by mouth at bedtime.  . nitroGLYCERIN (NITROSTAT) 0.4 MG SL tablet Place 0.4 mg under the tongue every 5 (five) minutes as needed for chest pain.  . Omega-3 Fatty Acids (FISH OIL) 1000 MG CAPS Take 1,000 mg by mouth 2 (two) times daily.  Marland Kitchen OVER THE COUNTER MEDICATION probotic 1 tablet daily  . pantoprazole (PROTONIX) 40 MG tablet Take 1 tablet (40 mg total) by mouth daily.  Marland Kitchen dicyclomine (BENTYL) 20 MG  tablet Take 1 tablet (20 mg total) by mouth 2 (two) times daily. (Patient not taking: Reported on 09/21/2014)   No Known Allergies   Past Medical History  Diagnosis Date  . Diabetes mellitus without complication   . Hypertension   . GERD (gastroesophageal reflux disease)   . Chronic back pain   . CAD (coronary artery disease)    Past Surgical History  Procedure Laterality Date  . Appendectomy    . Cardiac surgery      3 stent placed.  . Left heart catheterization with coronary angiogram N/A 06/27/2012    Procedure: LEFT HEART CATHETERIZATION WITH CORONARY ANGIOGRAM;  Surgeon: Candee Furbish, MD;  Location[facility] CATH LAB;  Service: Cardiovascular;  Laterality: N/A;  . Left heart catheterization with coronary angiogram N/A 07/25/2012    Procedure: LEFT HEART CATHETERIZATION WITH CORONARY ANGIOGRAM;  Surgeon: Sueanne Margarita, MD;  Location: [place] CATH LAB;  Service: Cardiovascular;  Laterality: N/A;   Review of Systems Systems review negative and non contributory otherwise.    Objective:   Physical Exam  BP 104/68 mmHg  Pulse 64  Temp(Src) 97.7 F (36.5 C)  Resp 16  Ht 5' 7.5" (1.715 m)  Wt 198 lb (89.812 kg)  BMI 30.54 kg/m2  HEENT - Eac's patent. TM's Nl. EOM's full. PERRLA. NasoOroPharynx clear. Neck -  supple. Nl Thyroid. Carotids 2+ & No bruits, nodes, JVD Chest - Clear equal BS w/o Rales, rhonchi, wheezes. Cor - Nl HS. RRR w/o sig MGR. PP 1(+). No edema. Abd - No palpable organomegaly, masses or tenderness. BS nl. MS- FROM w/o deformities. Muscle power, tone and bulk Nl. Gait Nl. Neuro - No obvious Cr N abnormalities. Sensory, motor and Cerebellar functions appear Nl w/o focal abnormalities. Psyche - Mental status normal & appropriate.  No delusions, ideations or obvious mood abnormalities.    Assessment & Plan:   1. Allergic sinusitis   2. Allergic rhinitis, unspecified allergic rhinitis type   3. Primary osteoarthritis involving multiple joints   - continue with Rx  Prednisone 10 mg #100 and recc to take 2 tab = 20 mg qod  - ROV - 1 month

## 2014-09-28 ENCOUNTER — Other Ambulatory Visit: Payer: Self-pay

## 2014-09-28 MED ORDER — METOPROLOL SUCCINATE ER 100 MG PO TB24: 100.0000 mg | ORAL_TABLET | Freq: Every day | ORAL | Status: DC

## 2014-10-22 ENCOUNTER — Encounter: Payer: Self-pay | Admitting: Internal Medicine

## 2014-10-22 ENCOUNTER — Ambulatory Visit (INDEPENDENT_AMBULATORY_CARE_PROVIDER_SITE_OTHER): Payer: BLUE CROSS/BLUE SHIELD | Admitting: Internal Medicine

## 2014-10-22 VITALS — BP 118/72 | HR 64 | Temp 97.5°F | Resp 16 | Ht 68.5 in | Wt 194.6 lb

## 2014-10-22 DIAGNOSIS — E559 Vitamin D deficiency, unspecified: Secondary | ICD-10-CM

## 2014-10-22 DIAGNOSIS — E782 Mixed hyperlipidemia: Secondary | ICD-10-CM

## 2014-10-22 DIAGNOSIS — Z79899 Other long term (current) drug therapy: Secondary | ICD-10-CM

## 2014-10-22 DIAGNOSIS — I10 Essential (primary) hypertension: Secondary | ICD-10-CM

## 2014-10-22 DIAGNOSIS — I2510 Atherosclerotic heart disease of native coronary artery without angina pectoris: Secondary | ICD-10-CM

## 2014-10-22 DIAGNOSIS — Z9861 Coronary angioplasty status: Secondary | ICD-10-CM

## 2014-10-22 DIAGNOSIS — E1121 Type 2 diabetes mellitus with diabetic nephropathy: Secondary | ICD-10-CM

## 2014-10-22 LAB — CBC WITH DIFFERENTIAL/PLATELET
BASOS ABS: 0 10*3/uL (ref 0.0–0.1)
Basophils Relative: 0 % (ref 0–1)
EOS PCT: 2 % (ref 0–5)
Eosinophils Absolute: 0.2 10*3/uL (ref 0.0–0.7)
HCT: 40.7 % (ref 39.0–52.0)
Hemoglobin: 13 g/dL (ref 13.0–17.0)
Lymphocytes Relative: 38 % (ref 12–46)
Lymphs Abs: 3.8 10*3/uL (ref 0.7–4.0)
MCH: 28.5 pg (ref 26.0–34.0)
MCHC: 31.9 g/dL (ref 30.0–36.0)
MCV: 89.3 fL (ref 78.0–100.0)
MPV: 10.9 fL (ref 8.6–12.4)
Monocytes Absolute: 0.7 10*3/uL (ref 0.1–1.0)
Monocytes Relative: 7 % (ref 3–12)
Neutro Abs: 5.4 10*3/uL (ref 1.7–7.7)
Neutrophils Relative %: 53 % (ref 43–77)
Platelets: 227 10*3/uL (ref 150–400)
RBC: 4.56 MIL/uL (ref 4.22–5.81)
RDW: 14.3 % (ref 11.5–15.5)
WBC: 10.1 10*3/uL (ref 4.0–10.5)

## 2014-10-22 NOTE — Progress Notes (Signed)
Subjective:    Patient ID: Michael Guzman, male    DOB: 04-29-1946, 69 y.o.   MRN: [id number]  HPI   This very nice 69 y.o. MBM also presents for 3 month follow up with Hypertension, Hyperlipidemia, T2_DM/CKD2  and Vitamin D Deficiency and also presents  with chronic allergies and perennial as well as seasonal allergies manifest as chronic rhino sinusitis and nasal stuffiness who has been on maximal therapy with antihistamines and nasal steroids. He was recently empirically started on alternate day steroids in anticipation of titration to the lowest effective dose. He does report significant improvement with far less frequent and transient episodes of a vague & fleeting type dizziness. He continues to report chronic nasal stuffiness, but does admit that it is significantly improved. Denied fever/chills, purulent secretions or mucus and denies any current lower respiratory sx's.   Patient is treated for HTN & BP has been controlled at home. Today's BP: 118/72 mmHg. Patient has had no complaints of any cardiac type chest pain, palpitations, dyspnea/orthopnea/PND, dizziness, claudication, or dependent edema.   Hyperlipidemia is controlled with diet & meds. Patient denies myalgias or other med SE's. Last Lipids were Total Chol 103; HDL 43; LDL  28; Triglycerides 160 on 07/24/2014.   Also, the patient has history of T2_NIDDM /CKD2 and has had no symptoms of reactive hypoglycemia, diabetic polys, paresthesias or visual blurring.  Last A1c was  8.1% on  07/24/2014.   Further, the patient also has history of Vitamin D Deficiency of 15 in 2008 and supplements vitamin D without any suspected side-effects. Last vitamin D was  89 on  07/24/2014.  Medication Sig  . aspirin 81 MG tablet Take 81 mg by mouth daily.  Marland Kitchen atorvastatin (LIPITOR) 40 MG tablet Take 0.5 tablets (20 mg total) by mouth daily.  Marland Kitchen azelastine (ASTELIN) 0.1 % nasal spray Place 2 sprays into both nostrils 2 (two) times daily. Use in each  nostril as directed  . Canagliflozin (INVOKANA) 300 MG TABS Take 150 mg by mouth daily.   . enalapril (VASOTEC) 20 MG tablet Take 1 tablet (20 mg total) by mouth daily.  . fluticasone (FLONASE) 50 MCG/ACT nasal spray Place 1 spray into both nostrils at bedtime.  . furosemide (LASIX) 20 MG tablet Take 1 tablet (20 mg total) by mouth daily as needed.  Marland Kitchen glipiZIDE (GLUCOTROL) 5 MG tablet Take 1 by mouth 3 times daily for glucose over 200 or as directed by your doctor  . Magnesium 250 MG TABS Take 250 mg by mouth daily.  . metFORMIN (GLUCOPHAGE-XR) 500 MG 24 hr tablet Take 1 to 2 tablets 2 x day as directed for Diabetes  . metoprolol succinate (TOPROL-XL) 100 MG 24 hr tablet Take 1 tablet (100 mg total) by mouth daily. Take with or immediately following a meal.  . montelukast (SINGULAIR) 10 MG tablet Take 10 mg by mouth at bedtime.  . nitroGLYCERIN (NITROSTAT) 0.4 MG SL tablet Place 0.4 mg under the tongue every 5 (five) minutes as needed for chest pain.  . Omega-3 Fatty Acids (FISH OIL) 1000 MG CAPS Take 1,000 mg by mouth 2 (two) times daily.  Marland Kitchen OVER THE COUNTER MEDICATION probotic 1 tablet daily  . pantoprazole (PROTONIX) 40 MG tablet Take 1 tablet (40 mg total) by mouth daily.  . predniSONE (STERAPRED UNI-PAK) 10 MG tablet Take by mouth daily. 1 to 2 tablets daily or as directed  . AMITIZA 24 MCG capsule   . dicyclomine (BENTYL) 20 MG tablet Take 1  tablet (20 mg total) by mouth 2 (two) times daily. (Patient not taking: Reported on 09/21/2014)   No Known Allergies   Past Medical History  Diagnosis Date  . Diabetes mellitus without complication   . Hypertension   . GERD (gastroesophageal reflux disease)   . Chronic back pain   . CAD (coronary artery disease)    Past Surgical History  Procedure Laterality Date  . Appendectomy    . Cardiac surgery      3 stent placed.  . Left heart catheterization with coronary angiogram N/A 06/27/2012    Procedure: LEFT HEART CATHETERIZATION WITH CORONARY  ANGIOGRAM;  Surgeon: Candee Furbish, MD;  Location: [facility] CATH LAB;  Service: Cardiovascular;  Laterality: N/A;  . Left heart catheterization with coronary angiogram N/A 07/25/2012    Procedure: LEFT HEART CATHETERIZATION WITH CORONARY ANGIOGRAM;  Surgeon: Sueanne Margarita, MD;  Location: [facility] CATH LAB;  Service: Cardiovascular;  Laterality: N/A;   Review of Systems  pertinent systems review as above and otherwise negative.    Objective:   Physical Exam BP 118/72 mmHg  Pulse 64  Temp(Src) 97.5 F (36.4 C)  Resp 16  Ht 5' 8.5" (1.74 m)  Wt 194 lb 9.6 oz (88.27 kg)  BMI 29.16 kg/m2  Nasal speech.  HEENT - Eac's patent. TM's Nl. EOM's full. PERRLA. No frontal/maxillary tenderness. NasoOroPharynx clear. Neck - supple. Nl Thyroid. Carotids 2+ & No bruits, nodes, JVD Chest - Clear equal BS w/o Rales, rhonchi, wheezes. Cor - Nl HS. RRR w/o sig MGR. PP 1(+). No edema. MS- FROM w/o deformities. Muscle power, tone and bulk Nl. Gait Nl. Neuro - No obvious Cr N abnormalities. Sensory, motor and Cerebellar functions appear Nl w/o focal abn. Skin - exposed- negative.    Assessment & Plan:   1. Essential hypertension  - TSH  2. Mixed hyperlipidemia  - Lipid panel  3. Type 2 diabetes mellitus with diabetic nephropathy  - Hemoglobin A1c - Insulin, fasting  4. ASCAD s/p PTCA (06/2012)  - Vit D  25 hydroxy (rtn osteoporosis monitoring)  5. Vitamin D deficiency  - continue suppplementation  6. Medication management  - CBC with Differential/Platelet - BASIC METABOLIC PANEL WITH GFR - Hepatic function panel - Magnesium  - Follow-Up  and disposition pending labs. Patient apprised of steroid effect on glucose intolerance and was strongly advised stricter diet than he has adhered in the past.

## 2014-10-22 NOTE — Patient Instructions (Signed)

## 2014-10-23 LAB — HEPATIC FUNCTION PANEL
ALT: 12 U/L (ref 0–53)
AST: 10 U/L (ref 0–37)
Albumin: 4 g/dL (ref 3.5–5.2)
Alkaline Phosphatase: 68 U/L (ref 39–117)
BILIRUBIN TOTAL: 0.4 mg/dL (ref 0.2–1.2)
Bilirubin, Direct: 0.1 mg/dL (ref 0.0–0.3)
Indirect Bilirubin: 0.3 mg/dL (ref 0.2–1.2)
TOTAL PROTEIN: 6.2 g/dL (ref 6.0–8.3)

## 2014-10-23 LAB — BASIC METABOLIC PANEL WITH GFR
BUN: 20 mg/dL (ref 6–23)
CHLORIDE: 99 meq/L (ref 96–112)
CO2: 23 mEq/L (ref 19–32)
Calcium: 9.2 mg/dL (ref 8.4–10.5)
Creat: 1.17 mg/dL (ref 0.50–1.35)
GFR, EST AFRICAN AMERICAN: 74 mL/min
GFR, Est Non African American: 64 mL/min
Glucose, Bld: 449 mg/dL — ABNORMAL HIGH (ref 70–99)
Potassium: 4.1 mEq/L (ref 3.5–5.3)
Sodium: 135 mEq/L (ref 135–145)

## 2014-10-23 LAB — LIPID PANEL
CHOL/HDL RATIO: 2.2 ratio
CHOLESTEROL: 121 mg/dL (ref 0–200)
HDL: 56 mg/dL (ref >39)
Triglycerides: 374 mg/dL — ABNORMAL HIGH (ref <150)
VLDL: 75 mg/dL — ABNORMAL HIGH (ref 0–40)

## 2014-10-23 LAB — HEMOGLOBIN A1C
HEMOGLOBIN A1C: 9.1 % — AB (ref <5.7)
Mean Plasma Glucose: 214 mg/dL — ABNORMAL HIGH (ref <117)

## 2014-10-23 LAB — TSH: TSH: 0.321 u[IU]/mL — AB (ref 0.350–4.500)

## 2014-10-23 LAB — VITAMIN D 25 HYDROXY (VIT D DEFICIENCY, FRACTURES): Vit D, 25-Hydroxy: 49 ng/mL (ref 30–100)

## 2014-10-23 LAB — MAGNESIUM: MAGNESIUM: 2 mg/dL (ref 1.5–2.5)

## 2014-10-23 LAB — INSULIN, FASTING: Insulin fasting, serum: 19.5 u[IU]/mL (ref 2.0–19.6)

## 2014-10-25 ENCOUNTER — Ambulatory Visit: Payer: Self-pay | Admitting: Physician Assistant

## 2014-10-30 ENCOUNTER — Other Ambulatory Visit: Payer: Self-pay | Admitting: *Deleted

## 2014-10-30 MED ORDER — MONTELUKAST SODIUM 10 MG PO TABS: 10.0000 mg | ORAL_TABLET | Freq: Every day | ORAL | Status: DC

## 2014-11-07 ENCOUNTER — Encounter: Payer: Self-pay | Admitting: Cardiology

## 2014-11-07 ENCOUNTER — Ambulatory Visit (INDEPENDENT_AMBULATORY_CARE_PROVIDER_SITE_OTHER): Payer: BLUE CROSS/BLUE SHIELD | Admitting: Cardiology

## 2014-11-07 VITALS — BP 110/70 | HR 73 | Ht 70.0 in | Wt 196.0 lb

## 2014-11-07 DIAGNOSIS — I2510 Atherosclerotic heart disease of native coronary artery without angina pectoris: Secondary | ICD-10-CM

## 2014-11-07 DIAGNOSIS — E1121 Type 2 diabetes mellitus with diabetic nephropathy: Secondary | ICD-10-CM

## 2014-11-07 DIAGNOSIS — Z9861 Coronary angioplasty status: Secondary | ICD-10-CM

## 2014-11-07 DIAGNOSIS — E782 Mixed hyperlipidemia: Secondary | ICD-10-CM

## 2014-11-07 NOTE — Patient Instructions (Signed)
Your physician recommends that you continue on your current medications as directed. Please refer to the Current Medication list given to you today.  Your physician wants you to follow-up in: 6 months. You will receive a reminder letter in the mail two months in advance. If you don't receive a letter, please call our office to schedule the follow-up appointment.  

## 2014-11-07 NOTE — Progress Notes (Signed)
[place]. [address], [address], [place]  [address] Phone: [phone] Fax:  [phone]  Date:  11/07/2014   ID:  Roshon, Duell [birth year], MRN [id number]  PCP:  Alesia Richards, MD   History of Present Illness: BOGDAN VIVONA is a 69 y.o. male with coronary artery disease 10/13 drug eluting stent to mid LAD and OM2, normal ejection fraction with recurrent chest discomfort, repeat catheterization reassuring.  Constipation has been a problem for him. Lifting heavy objects sometimes wipes him out but this seems to be improving. He won his last fishing tournament.   Left lower quadrant pain. Colonoscopy normal.   As long as he keeps bowels loose, dosen't bother him.    Was having some trouble picking up stuff. All in all feeling good. PSA was elevated. Bx done.    Wt Readings from Last 3 Encounters:  11/07/14 196 lb (88.905 kg)  10/22/14 194 lb 9.6 oz (88.27 kg)  09/21/14 198 lb (89.812 kg)     Past Medical History  Diagnosis Date  . Diabetes mellitus without complication   . Hypertension   . GERD (gastroesophageal reflux disease)   . Chronic back pain   . CAD (coronary artery disease)     Past Surgical History  Procedure Laterality Date  . Appendectomy    . Cardiac surgery      3 stent placed.  . Left heart catheterization with coronary angiogram N/A 06/27/2012    Procedure: LEFT HEART CATHETERIZATION WITH CORONARY ANGIOGRAM;  Surgeon: Candee Furbish, MD;  [facility] CATH LAB;  Service: Cardiovascular;  Laterality: N/A;  . Left heart catheterization with coronary angiogram N/A 07/25/2012    Procedure: LEFT HEART CATHETERIZATION WITH CORONARY ANGIOGRAM;  Surgeon: Sueanne Margarita, MD;  Location: [place] CATH LAB;  Service: Cardiovascular;  Laterality: N/A;    Current Outpatient Prescriptions  Medication Sig Dispense Refill  . aspirin 81 MG tablet Take 81 mg by mouth daily.    Marland Kitchen atorvastatin (LIPITOR) 40 MG tablet Take 0.5 tablets (20 mg total)  by mouth daily. 45 tablet 3  . azelastine (ASTELIN) 0.1 % nasal spray Place 2 sprays into both nostrils 2 (two) times daily. Use in each nostril as directed    . Canagliflozin (INVOKANA) 300 MG TABS Take 150 mg by mouth daily.     . enalapril (VASOTEC) 20 MG tablet Take 1 tablet (20 mg total) by mouth daily. 90 tablet 1  . fluticasone (FLONASE) 50 MCG/ACT nasal spray Place 1 spray into both nostrils at bedtime.    . furosemide (LASIX) 20 MG tablet Take 1 tablet (20 mg total) by mouth daily as needed. 30 tablet 4  . glipiZIDE (GLUCOTROL) 5 MG tablet Take 1 by mouth 3 times daily for glucose over 200 or as directed by your doctor 270 tablet 3  . Magnesium 250 MG TABS Take 250 mg by mouth daily.    . metFORMIN (GLUCOPHAGE-XR) 500 MG 24 hr tablet Take 1 to 2 tablets 2 x day as directed for Diabetes 360 tablet 1  . metoprolol succinate (TOPROL-XL) 100 MG 24 hr tablet Take 1 tablet (100 mg total) by mouth daily. Take with or immediately following a meal. 90 tablet 0  . montelukast (SINGULAIR) 10 MG tablet Take 1 tablet (10 mg total) by mouth at bedtime. 90 tablet 3  . nitroGLYCERIN (NITROSTAT) 0.4 MG SL tablet Place 0.4 mg under the tongue every 5 (five) minutes as needed for chest pain.    Marland Kitchen  Omega-3 Fatty Acids (FISH OIL) 1000 MG CAPS Take 1,000 mg by mouth 2 (two) times daily.    Marland Kitchen OVER THE COUNTER MEDICATION probotic 1 tablet daily    . pantoprazole (PROTONIX) 40 MG tablet Take 1 tablet (40 mg total) by mouth daily. 90 tablet 3  . predniSONE (STERAPRED UNI-PAK) 10 MG tablet Take by mouth daily. 1 to 2 tablets daily or as directed 100 tablet 0   No current facility-administered medications for this visit.    Allergies:    No Known Allergies  Social History:  The patient  reports that he quit smoking about 3 years ago. He has never used smokeless tobacco. He reports that he does not drink alcohol or use illicit drugs.   ROS:  Please see the history of present illness.   Denies any fevers, chills,  orthopnea, PND. He feels sinus congestion, run down at times.    PHYSICAL EXAM: VS:  BP 110/70 mmHg  Pulse 73  Ht 5\' 10"  (1.778 m)  Wt 196 lb (88.905 kg)  BMI 28.12 kg/m2  SpO2 94% Well nourished, well developed, in no acute distress HEENT: normal Neck: no JVD Cardiac:  normal S1, S2; RRR; no murmur Lungs:  clear to auscultation bilaterally, no wheezing, rhonchi or rales Abd: soft, nontender, no hepatomegalyoverweight. Ext: trace LE edema Skin: warm and dry Neuro: no focal abnormalities noted  EKG:  No EKG today.  ASSESSMENT AND PLAN:  1. Coronary artery disease-postintervention to LAD, OM. Doing well. No anginal symptoms.I will continue with ASA. No evidence of bleeding. His fatigue, some shortness of breath at times, transient especially after stairs, lifting heavy objects. This symptom actually has improved. He is doing better. 2. Constipation-Metamucil. 3. Hypertension- Bystolic was quite expensive for him. Tolerating metoprolol succinate 100 mg once a day well. Beta blocker could be contributing to her fatigue as well. He however feels better. 4. Diabetes with renal complication- trying to control. Hemoglobin A1c 9.1. Glucose was 449. He is working hard on this currently. 5. Sinus congestion-continuing to try Mucinex/Allegra. 6. Hyperlipidemia-LDL direct is 45 on last check. I decreased his atorvastatin to 20 mg. his last triglycerides however were quite elevated and his LDL was unable to be Occluded. As is glucose improves, this should improve as well. 7. Hypertension-good control. 8. I will see him back in 6 months.  Signed, Candee Furbish, MD [facility]  11/07/2014 3:12 PM

## 2014-11-25 ENCOUNTER — Encounter: Payer: Self-pay | Admitting: Internal Medicine

## 2014-11-26 ENCOUNTER — Telehealth: Payer: Self-pay | Admitting: *Deleted

## 2014-11-26 NOTE — Telephone Encounter (Signed)
Patient called and request an appointment with an allergist for chronic infections.  OK to schedule appointment per Dr Melford Aase.  Appointment with Dr Orvil Feil on 12/17/2014 at 12:30.

## 2014-11-27 ENCOUNTER — Encounter: Payer: Self-pay | Admitting: Internal Medicine

## 2014-11-27 ENCOUNTER — Other Ambulatory Visit: Payer: Self-pay | Admitting: Internal Medicine

## 2014-11-28 ENCOUNTER — Encounter: Payer: Self-pay | Admitting: Internal Medicine

## 2014-11-28 ENCOUNTER — Ambulatory Visit (INDEPENDENT_AMBULATORY_CARE_PROVIDER_SITE_OTHER): Payer: BLUE CROSS/BLUE SHIELD | Admitting: Internal Medicine

## 2014-11-28 VITALS — BP 118/80 | HR 68 | Temp 98.0°F | Resp 16 | Ht 68.5 in | Wt 193.0 lb

## 2014-11-28 DIAGNOSIS — R5383 Other fatigue: Secondary | ICD-10-CM

## 2014-11-28 DIAGNOSIS — G629 Polyneuropathy, unspecified: Secondary | ICD-10-CM

## 2014-11-28 LAB — IRON AND TIBC
%SAT: 21 % (ref 20–55)
Iron: 62 ug/dL (ref 42–165)
TIBC: 299 ug/dL (ref 215–435)
UIBC: 237 ug/dL (ref 125–400)

## 2014-11-28 NOTE — Progress Notes (Signed)
   Subjective:    Patient ID: Michael Guzman, male    DOB: May 09, 1946, 69 y.o.   MRN: [id number]  Foot Pain Associated symptoms include joint swelling and numbness. Pertinent negatives include no chills, fatigue, fever, nausea, rash, vomiting or weakness.  Patient is a 69 y.o. Male who presents to the office for bilateral constant tingling in his feet.  He has recently bought a new pair of shoes and feels that this has been contributing to some of this feeling.  He describes it as sometimes pins and needles and sometimes a mild burning sensation.  He has the feeling in both feet.  It has improved some spontaneously.  He cannot think of anything that really made his pain worse.  He is not a vegeterian.  He does not drink.  He does not report any changes in his diet.    Review of Systems  Constitutional: Negative for fever, chills and fatigue.  Cardiovascular: Negative for leg swelling.  Gastrointestinal: Negative for nausea, vomiting, diarrhea and constipation.  Musculoskeletal: Positive for back pain (Chronic issue but unchanged) and joint swelling. Negative for gait problem.  Skin: Negative for rash and wound.  Neurological: Positive for numbness. Negative for weakness.       Objective:   Physical Exam  Constitutional: He appears well-developed and well-nourished. No distress.  HENT:  Head: Normocephalic and atraumatic.  Mouth/Throat: No oropharyngeal exudate.  Eyes: Conjunctivae are normal. No scleral icterus.  Neck: Normal range of motion.  Cardiovascular: Normal rate, regular rhythm, normal heart sounds and intact distal pulses.  Exam reveals no gallop and no friction rub.   No murmur heard. Pulses:      Dorsalis pedis pulses are 2+ on the right side, and 2+ on the left side.       Posterior tibial pulses are 2+ on the right side, and 2+ on the left side.  Negative holmans sign bilaterally.  No evidence of leg swelling  Pulmonary/Chest: Effort normal and breath sounds normal. No  respiratory distress. He has no wheezes. He has no rales. He exhibits no tenderness.  Musculoskeletal:       Right ankle: Normal. He exhibits normal range of motion, no swelling, no ecchymosis, no deformity, no laceration and normal pulse. No tenderness. Achilles tendon normal.       Left ankle: He exhibits normal range of motion, no swelling, no ecchymosis, no deformity, no laceration and normal pulse. No tenderness. Achilles tendon normal.  Bilateral feet with  Normal sensation to light touch.  Normal proprioception with eyes closed of the big toes.  Normal monofilament testing.    Skin: He is not diaphoretic.  Nursing note and vitals reviewed.         Assessment & Plan:    1. Peripheral neuropathy -ddx includes B12 and folate deficiency, iron deficiency, diabetic peripheral neuropathy, and less likely radicular pain secondary back pain.  Suspect given history of uncontrolled diabetes that this is likely diabetic peripheral neuropathy.  Patient wants to try to see how it does.  If it gets worse we will call in gabapentin.   -check feet daily - Vitamin B12 - RBC Folate - Iron and TIBC - Ferritin

## 2014-11-28 NOTE — Patient Instructions (Signed)

## 2014-11-29 LAB — FERRITIN: Ferritin: 16 ng/mL — ABNORMAL LOW (ref 22–322)

## 2014-11-29 LAB — VITAMIN B12: VITAMIN B 12: 833 pg/mL (ref 211–911)

## 2014-11-29 LAB — FOLATE RBC: RBC FOLATE: 730 ng/mL (ref >280)

## 2014-12-28 ENCOUNTER — Other Ambulatory Visit: Payer: Self-pay | Admitting: *Deleted

## 2014-12-28 MED ORDER — METOPROLOL SUCCINATE ER 100 MG PO TB24: 100.0000 mg | ORAL_TABLET | Freq: Every day | ORAL | Status: DC

## 2014-12-31 ENCOUNTER — Other Ambulatory Visit: Payer: Self-pay | Admitting: *Deleted

## 2014-12-31 MED ORDER — METOPROLOL SUCCINATE ER 100 MG PO TB24
100.0000 mg | ORAL_TABLET | Freq: Every day | ORAL | Status: DC
Start: 2014-12-31 — End: 2015-05-07

## 2015-01-10 ENCOUNTER — Encounter: Payer: Self-pay | Admitting: Internal Medicine

## 2015-01-11 ENCOUNTER — Other Ambulatory Visit: Payer: Self-pay | Admitting: Internal Medicine

## 2015-01-31 ENCOUNTER — Encounter: Payer: Self-pay | Admitting: Internal Medicine

## 2015-01-31 ENCOUNTER — Ambulatory Visit (INDEPENDENT_AMBULATORY_CARE_PROVIDER_SITE_OTHER): Payer: BLUE CROSS/BLUE SHIELD | Admitting: Internal Medicine

## 2015-01-31 VITALS — BP 110/72 | HR 76 | Temp 97.7°F | Resp 16 | Ht 68.5 in | Wt 195.0 lb

## 2015-01-31 DIAGNOSIS — K219 Gastro-esophageal reflux disease without esophagitis: Secondary | ICD-10-CM

## 2015-01-31 DIAGNOSIS — I2510 Atherosclerotic heart disease of native coronary artery without angina pectoris: Secondary | ICD-10-CM

## 2015-01-31 DIAGNOSIS — Z79899 Other long term (current) drug therapy: Secondary | ICD-10-CM

## 2015-01-31 DIAGNOSIS — E559 Vitamin D deficiency, unspecified: Secondary | ICD-10-CM

## 2015-01-31 DIAGNOSIS — E1121 Type 2 diabetes mellitus with diabetic nephropathy: Secondary | ICD-10-CM

## 2015-01-31 DIAGNOSIS — E782 Mixed hyperlipidemia: Secondary | ICD-10-CM

## 2015-01-31 DIAGNOSIS — I10 Essential (primary) hypertension: Secondary | ICD-10-CM

## 2015-01-31 DIAGNOSIS — Z9861 Coronary angioplasty status: Secondary | ICD-10-CM

## 2015-01-31 LAB — CBC WITH DIFFERENTIAL/PLATELET
BASOS PCT: 0 % (ref 0–1)
Basophils Absolute: 0 10*3/uL (ref 0.0–0.1)
Eosinophils Absolute: 0 10*3/uL (ref 0.0–0.7)
Eosinophils Relative: 0 % (ref 0–5)
HCT: 43.2 % (ref 39.0–52.0)
HEMOGLOBIN: 14.1 g/dL (ref 13.0–17.0)
LYMPHS ABS: 1.7 10*3/uL (ref 0.7–4.0)
Lymphocytes Relative: 17 % (ref 12–46)
MCH: 29.7 pg (ref 26.0–34.0)
MCHC: 32.6 g/dL (ref 30.0–36.0)
MCV: 91.1 fL (ref 78.0–100.0)
MONOS PCT: 4 % (ref 3–12)
MPV: 12 fL (ref 8.6–12.4)
Monocytes Absolute: 0.4 10*3/uL (ref 0.1–1.0)
NEUTROS ABS: 7.7 10*3/uL (ref 1.7–7.7)
NEUTROS PCT: 79 % — AB (ref 43–77)
Platelets: 200 10*3/uL (ref 150–400)
RBC: 4.74 MIL/uL (ref 4.22–5.81)
RDW: 13.5 % (ref 11.5–15.5)
WBC: 9.8 10*3/uL (ref 4.0–10.5)

## 2015-01-31 NOTE — Progress Notes (Signed)
Patient ID: Michael Guzman, male   DOB: Dec 23, 1945, 69 y.o.   MRN: [id number]   This very nice 69 y.o. MBM presents for 6 month follow up with Hypertension, Hyperlipidemia, T2_NIDDM and Vitamin D Deficiency.    Patient is treated for HTN since 1991 & BP has been controlled at home. Today's BP: 110/72 mmHg. He does have ASCAD with PTCA & DES in Oct 2013 and f/u cath in Nov 2013 finding a patent stent.  Patient has had no recent complaints of any cardiac or exertional type chest pain, palpitations, dyspnea/orthopnea/PND, dizziness, claudication, or dependent edema.   Hyperlipidemia is controlled with diet & meds. Patient denies myalgias or other med SE's. Last Lipids were  Total Chol 121 & HDL 56 at goal, but  LDL not calc; with elevated Trig 374 on 10/22/2014.   Also, the patient has history of T2_NIDDM since 1998 and has had no symptoms of reactive hypoglycemia, diabetic polys, paresthesias or visual blurring.  Last A1c was not at goal due to dietary non-compliance with A1c of  9.1% on 10/22/2014. Patient admits/reports excessive compulsive overeating.   Further, the patient also has history of Vitamin D Deficiency and supplements vitamin D without any suspected side-effects. Last vitamin D was  49 on 10/22/2014.  Medication Sig  . aspirin 81 MG tablet Take 81 mg by mouth daily.  Marland Kitchen atorvastatin (LIPITOR) 40 MG tablet Take 0.5 tablets (20 mg total) by mouth daily.  Marland Kitchen azelastine (ASTELIN) 0.1 % nasal spray Place 2 sprays into both nostrils 2 (two) times daily. Use in each nostril as directed  . Canagliflozin (INVOKANA) 300 MG TABS Take 150 mg by mouth daily.   . enalapril (VASOTEC) 20 MG tablet Take 1 tablet (20 mg total) by mouth daily.  . fluticasone (FLONASE) 50 MCG/ACT nasal spray Place 1 spray into both nostrils at bedtime.  . furosemide (LASIX) 20 MG tablet Take 1 tablet (20 mg total) by mouth daily as needed.  Marland Kitchen glipiZIDE (GLUCOTROL) 5 MG tablet Take 1 by mouth 3 times daily for glucose over 200  or as directed by your doctor  . Magnesium 250 MG TABS Take 250 mg by mouth daily.  . metFORMIN (GLUCOPHAGE-XR) 500 MG 24 hr tablet Take 1 to 2 tablets 2 x day as directed for Diabetes  . metoprolol succinate (TOPROL-XL) 100 MG 24 hr tablet Take 1 tablet (100 mg total) by mouth daily. Take with or immediately following a meal.  . montelukast (SINGULAIR) 10 MG tablet Take 1 tablet (10 mg total) by mouth at bedtime.  . nitroGLYCERIN (NITROSTAT) 0.4 MG SL tablet Place 0.4 mg under the tongue every 5 (five) minutes as needed for chest pain.  . Omega-3 Fatty Acids (FISH OIL) 1000 MG CAPS Take 1,000 mg by mouth 2 (two) times daily.  Marland Kitchen OVER THE COUNTER MEDICATION probotic 1 tablet daily  . pantoprazole (PROTONIX) 40 MG tablet Take 1 tablet (40 mg total) by mouth daily.  . predniSONE (DELTASONE) 10 MG tablet TAKE BY MOUTH DAILY 1 TO 2 TABLETS DAILY OR AS DIRECTED  . predniSONE (STERAPRED UNI-PAK) 10 MG tablet Take by mouth daily. 1 to 2 tablets daily or as directed   No Known Allergies  PMHx:   Past Medical History  Diagnosis Date  . Diabetes mellitus without complication   . Hypertension   . GERD (gastroesophageal reflux disease)   . Chronic back pain   . CAD (coronary artery disease)    Immunization History  Administered Date(s) Administered  .  Influenza Split 06/26/2012  . Influenza-Unspecified 06/14/2013, 06/25/2014  . Pneumococcal-Unspecified 06/16/2010  . Td 09/17/2003  . Tdap 11/26/2013  . Zoster 07/12/2012   Past Surgical History  Procedure Laterality Date  . Appendectomy    . Cardiac surgery      3 stent placed.  . Left heart catheterization with coronary angiogram N/A 06/27/2012    Procedure: LEFT HEART CATHETERIZATION WITH CORONARY ANGIOGRAM;  Surgeon: Candee Furbish, MD;  Location: [facility] CATH LAB;  Service: Cardiovascular;  Laterality: N/A;  . Left heart catheterization with coronary angiogram N/A 07/25/2012    Procedure: LEFT HEART CATHETERIZATION WITH CORONARY ANGIOGRAM;   Surgeon: Sueanne Margarita, MD;  Location: [facility] CATH LAB;  Service: Cardiovascular;  Laterality: N/A;   FHx:    Reviewed / unchanged  SHx:    Reviewed / unchanged  Systems Review:  Constitutional: Denies fever, chills, wt changes, headaches, insomnia, fatigue, night sweats, change in appetite. Eyes: Denies redness, blurred vision, diplopia, discharge, itchy, watery eyes.  ENT: Denies discharge, congestion, post nasal drip, epistaxis, sore throat, earache, hearing loss, dental pain, tinnitus, vertigo, sinus pain, snoring.  CV: Denies chest pain, palpitations, irregular heartbeat, syncope, dyspnea, diaphoresis, orthopnea, PND, claudication or edema. Respiratory: denies cough, dyspnea, DOE, pleurisy, hoarseness, laryngitis, wheezing.  Gastrointestinal: Denies dysphagia, odynophagia, heartburn, reflux, water brash, abdominal pain or cramps, nausea, vomiting, bloating, diarrhea, constipation, hematemesis, melena, hematochezia  or hemorrhoids. Genitourinary: Denies dysuria, frequency, urgency, nocturia, hesitancy, discharge, hematuria or flank pain. Musculoskeletal: Denies arthralgias, myalgias, stiffness, jt. swelling, pain, limping or strain/sprain.  Skin: Denies pruritus, rash, hives, warts, acne, eczema or change in skin lesion(s). Neuro: No weakness, tremor, incoordination, spasms, paresthesia or pain. Psychiatric: Denies confusion, memory loss or sensory loss. Endo: Denies change in weight, skin or hair change.  Heme/Lymph: No excessive bleeding, bruising or enlarged lymph nodes.  Physical Exam  BP 110/72      Pulse 76      Temp 97.7 F      Resp 16    Ht 5' 8.5   "   Wt 195 lb          BMI 29.21   Appears well nourished and in no distress. Eyes: PERRLA, EOMs, conjunctiva no swelling or erythema. Sinuses: No frontal/maxillary tenderness ENT/Mouth: EAC's clear, TM's nl w/o erythema, bulging. Nares clear w/o erythema, swelling, exudates. Oropharynx clear without erythema or exudates. Oral  hygiene is good. Tongue normal, non obstructing. Hearing intact.  Neck: Supple. Thyroid nl. Car 2+/2+ without bruits, nodes or JVD. Chest: Respirations nl with BS clear & equal w/o rales, rhonchi, wheezing or stridor.  Cor: Heart sounds normal w/ regular rate and rhythm without sig. murmurs, gallops, clicks, or rubs. Peripheral pulses normal and equal  without edema.  Abdomen: Soft & bowel sounds normal. Non-tender w/o guarding, rebound, hernias, masses, or organomegaly.  Lymphatics: Unremarkable.  Musculoskeletal: Full ROM all peripheral extremities, joint stability, 5/5 strength, and normal gait.  Skin: Warm, dry without exposed rashes, lesions or ecchymosis apparent.  Neuro: Cranial nerves intact, reflexes equal bilaterally. Sensory-motor testing grossly intact. Tendon reflexes grossly intact.  Pysch: Alert & oriented x 3.  Insight and judgement nl & appropriate. No ideations.  Assessment and Plan:  1. Essential hypertension  - TSH  2. Mixed hyperlipidemia  - Lipid panel  3. T2_NIDDM w/CKD2 (GFR 64 ml/min)  - Hemoglobin A1c - Insulin, random  4. Vitamin D deficiency  - Vit D  25 hydroxy  5. Gastroesophageal reflux disease   6. ASCAD s/p PTCA (06/2012)  7. Medication management  - CBC with Differential/Platelet - BASIC METABOLIC PANEL WITH GFR - Hepatic function panel - Magnesium   Recommended regular exercise, BP monitoring, weight control, and discussed med and SE's. Recommended labs to assess and monitor clinical status. Further disposition pending results of labs. Over 30 minutes of exam, counseling, chart review was performed

## 2015-01-31 NOTE — Patient Instructions (Signed)

## 2015-02-01 LAB — BASIC METABOLIC PANEL WITH GFR
BUN: 20 mg/dL (ref 6–23)
CO2: 23 mEq/L (ref 19–32)
CREATININE: 0.99 mg/dL (ref 0.50–1.35)
Calcium: 9.3 mg/dL (ref 8.4–10.5)
Chloride: 101 mEq/L (ref 96–112)
GFR, Est African American: >89 mL/min
GFR, Est Non African American: 78 mL/min
Glucose, Bld: 316 mg/dL — ABNORMAL HIGH (ref 70–99)
Potassium: 4.5 mEq/L (ref 3.5–5.3)
Sodium: 137 mEq/L (ref 135–145)

## 2015-02-01 LAB — HEPATIC FUNCTION PANEL
ALT: 20 U/L (ref 0–53)
AST: 16 U/L (ref 0–37)
Albumin: 3.9 g/dL (ref 3.5–5.2)
Alkaline Phosphatase: 66 U/L (ref 39–117)
Bilirubin, Direct: 0.1 mg/dL (ref 0.0–0.3)
Indirect Bilirubin: 0.3 mg/dL (ref 0.2–1.2)
TOTAL PROTEIN: 6.5 g/dL (ref 6.0–8.3)
Total Bilirubin: 0.4 mg/dL (ref 0.2–1.2)

## 2015-02-01 LAB — HEMOGLOBIN A1C
HEMOGLOBIN A1C: 11.1 % — AB (ref <5.7)
MEAN PLASMA GLUCOSE: 272 mg/dL — AB (ref <117)

## 2015-02-01 LAB — LIPID PANEL
CHOLESTEROL: 129 mg/dL (ref 0–200)
HDL: 51 mg/dL (ref >=40)
LDL Cholesterol: 11 mg/dL (ref 0–99)
Total CHOL/HDL Ratio: 2.5 Ratio
Triglycerides: 333 mg/dL — ABNORMAL HIGH (ref <150)
VLDL: 67 mg/dL — ABNORMAL HIGH (ref 0–40)

## 2015-02-01 LAB — TSH: TSH: 0.146 u[IU]/mL — ABNORMAL LOW (ref 0.350–4.500)

## 2015-02-01 LAB — VITAMIN D 25 HYDROXY (VIT D DEFICIENCY, FRACTURES): Vit D, 25-Hydroxy: 45 ng/mL (ref 30–100)

## 2015-02-01 LAB — INSULIN, RANDOM: INSULIN: 14 u[IU]/mL (ref 2.0–19.6)

## 2015-02-01 LAB — MAGNESIUM: Magnesium: 2 mg/dL (ref 1.5–2.5)

## 2015-02-22 ENCOUNTER — Other Ambulatory Visit: Payer: Self-pay | Admitting: Internal Medicine

## 2015-03-05 ENCOUNTER — Encounter: Payer: Self-pay | Admitting: Internal Medicine

## 2015-03-06 ENCOUNTER — Ambulatory Visit (INDEPENDENT_AMBULATORY_CARE_PROVIDER_SITE_OTHER): Payer: BLUE CROSS/BLUE SHIELD | Admitting: Physician Assistant

## 2015-03-06 ENCOUNTER — Encounter: Payer: Self-pay | Admitting: Internal Medicine

## 2015-03-06 VITALS — BP 102/68 | HR 78 | Temp 98.2°F | Resp 16 | Ht 68.5 in | Wt 192.0 lb

## 2015-03-06 DIAGNOSIS — G629 Polyneuropathy, unspecified: Secondary | ICD-10-CM

## 2015-03-06 DIAGNOSIS — E1342 Other specified diabetes mellitus with diabetic polyneuropathy: Secondary | ICD-10-CM

## 2015-03-06 DIAGNOSIS — E1142 Type 2 diabetes mellitus with diabetic polyneuropathy: Secondary | ICD-10-CM | POA: Insufficient documentation

## 2015-03-06 MED ORDER — GABAPENTIN 300 MG PO CAPS
300.0000 mg | ORAL_CAPSULE | Freq: Three times a day (TID) | ORAL | Status: DC
Start: 2015-03-06 — End: 2015-04-23

## 2015-03-06 NOTE — Progress Notes (Signed)
Subjective:    Patient ID: Michael Guzman, male    DOB: 13-Feb-1946, 69 y.o.   MRN: [id number]  HPI 69 y.o. AAM with history of CAD, HTN, DM 2 with CKD and peripheral neuropathy presents with worsening numbness. He has been put on prednisone recently for his allergies and back however he states his sugar has been worse. He was seen in march with normal B12, folate, likely due to his uncontrolled DM. His sugars have been running 150-200 in the AM. He is taking invokana 300 mg 1/2 tablet in the AM, metformin 500mg  TID, and glipizide AM and afternoon. Does not drink alcohol.    Lab Results  Component Value Date   HGBA1C 11.1* 01/31/2015    Past Medical History  Diagnosis Date  . Diabetes mellitus without complication   . Hypertension   . GERD (gastroesophageal reflux disease)   . Chronic back pain   . CAD (coronary artery disease)    Current Outpatient Prescriptions on File Prior to Visit  Medication Sig Dispense Refill  . aspirin 81 MG tablet Take 81 mg by mouth daily.    Marland Kitchen atorvastatin (LIPITOR) 40 MG tablet Take 0.5 tablets (20 mg total) by mouth daily. 45 tablet 3  . Canagliflozin (INVOKANA) 300 MG TABS Take 150 mg by mouth daily.     Marland Kitchen DYMISTA 137-50 MCG/ACT SUSP   0  . enalapril (VASOTEC) 20 MG tablet Take 1 tablet (20 mg total) by mouth daily. 90 tablet 1  . furosemide (LASIX) 20 MG tablet Take 1 tablet (20 mg total) by mouth daily as needed. 30 tablet 4  . glipiZIDE (GLUCOTROL) 5 MG tablet Take 1 by mouth 3 times daily for glucose over 200 or as directed by your doctor 270 tablet 3  . INVOKANA 300 MG TABS tablet TAKE 1/2 TO 1 BY MOUTH DAILY AS DIRECTED FOR DIABETES 90 tablet 0  . levocetirizine (XYZAL) 5 MG tablet   1  . Magnesium 250 MG TABS Take 250 mg by mouth daily.    . metFORMIN (GLUCOPHAGE-XR) 500 MG 24 hr tablet Take 1 to 2 tablets 2 x day as directed for Diabetes 360 tablet 1  . metoprolol succinate (TOPROL-XL) 100 MG 24 hr tablet Take 1 tablet (100 mg total) by  mouth daily. Take with or immediately following a meal. 90 tablet 1  . montelukast (SINGULAIR) 10 MG tablet Take 1 tablet (10 mg total) by mouth at bedtime. 90 tablet 3  . nitroGLYCERIN (NITROSTAT) 0.4 MG SL tablet Place 0.4 mg under the tongue every 5 (five) minutes as needed for chest pain.    . Omega-3 Fatty Acids (FISH OIL) 1000 MG CAPS Take 1,000 mg by mouth 2 (two) times daily.    . pantoprazole (PROTONIX) 40 MG tablet Take 1 tablet (40 mg total) by mouth daily. 90 tablet 3  . predniSONE (DELTASONE) 10 MG tablet TAKE BY MOUTH DAILY 1 TO 2 TABLETS DAILY OR AS DIRECTED 100 tablet 1   No current facility-administered medications on file prior to visit.     Review of Systems  Constitutional: Negative for fever, chills and fatigue.  Cardiovascular: Negative for leg swelling.  Gastrointestinal: Negative for nausea, vomiting, diarrhea and constipation.  Musculoskeletal: Positive for back pain (Chronic issue but unchanged) and joint swelling. Negative for gait problem.  Skin: Negative for rash and wound.  Neurological: Positive for numbness. Negative for weakness.       Objective:   Physical Exam  Constitutional: He appears well-developed and  well-nourished. No distress.  HENT:  Head: Normocephalic and atraumatic.  Mouth/Throat: No oropharyngeal exudate.  Eyes: Conjunctivae are normal. No scleral icterus.  Neck: Normal range of motion.  Cardiovascular: Normal rate, regular rhythm, normal heart sounds and intact distal pulses.  Exam reveals no gallop and no friction rub.   No murmur heard. Pulses:      Dorsalis pedis pulses are 2+ on the right side, and 2+ on the left side.       Posterior tibial pulses are 2+ on the right side, and 2+ on the left side.  Negative holmans sign bilaterally.  No evidence of leg swelling  Pulmonary/Chest: Effort normal and breath sounds normal. No respiratory distress. He has no wheezes. He has no rales. He exhibits no tenderness.  Musculoskeletal:        Right ankle: Normal. He exhibits normal range of motion, no swelling, no ecchymosis, no deformity, no laceration and normal pulse. No tenderness. Achilles tendon normal.       Left ankle: He exhibits normal range of motion, no swelling, no ecchymosis, no deformity, no laceration and normal pulse. No tenderness. Achilles tendon normal.  Bilateral feet with  Normal sensation to light touch.  Normal proprioception with eyes closed of the big toes.  Normal monofilament testing.    Skin: He is not diaphoretic.  Nursing note and vitals reviewed.     Assessment & Plan:  Peripheral neuropathy due to DM II, uncontrolled Will increase glipizide to TID, strict diet but we had a very long conversation about insulin and patient is willing to convert to NPH BID next OV if sugars are not better.  Will start gabapentin in the mean time Follow up here in August  Future Appointments Date Time Provider [place]  05/07/2015 3:30 PM Jerline Pain, MD [facility] None  08/01/2015 2:00 PM [name], MD [facility] None

## 2015-03-06 NOTE — Patient Instructions (Signed)
Can take the gabapentin for nerve pain. It can make you sleepy so we suggest trying it at night first and please plan to not drive or do anything strenuous. Also please do not take this medication with alcohol.    Start out 1 pill at night before bed for 3-5 night,  can increase to 2 pills at night before bed.  Please call the office if you have any side effects.   Can take 3 pills a day however you would like  Some examples: - 1 breakfast, lunch, bedtime. - 1 at breakfast, 2 at bed time  How should I use this medicine? Take this medicine by mouth with a glass of water. Follow the directions on the prescription label. You can take this medicine with or without food. Take your doses at regular intervals. Do not take your medicine more often than directed. Do not stop taking except on your doctor's advice.  What if I miss a dose? If you miss a dose, take it as soon as you can. If it is almost time for your next dose, take only that dose. Do not take double or extra doses.  What should I watch for while using this medicine? Tell your doctor or healthcare professional if your symptoms do not start to get better or if they get worse.   You may get drowsy or dizzy. Do not drive, use machinery, or do anything that needs mental alertness until you know how this medicine affects you. Do not stand or sit up quickly, especially if you are an older patient. This reduces the risk of dizzy or fainting spells. Alcohol may interfere with the effect of this medicine. Avoid alcoholic drinks. If you have a heart condition, like congestive heart failure, and notice that you are retaining water and have swelling in your hands or feet, contact your health care provider immediately.  What side effects may I notice from receiving this medicine? Side effects that you should report to your doctor or health care professional as soon as possible and are very rare: -allergic reactions like skin rash, itching or hives,  swelling of the face, lips, or tongue -breathing problems -changes in vision -jerking or unusual movements of any part of your body -suicidal thoughts or other mood changes -swelling of the ankles, feet, hands -unusual bruising or bleeding  Side effects that usually do not require medical attention (Report these to your doctor or health care professional if they continue or are bothersome.): -dizziness -drowsiness -dry mouth -nausea -tremors  Peripheral Neuropathy Peripheral neuropathy is a type of nerve damage. It affects nerves that carry signals between the spinal cord and other parts of the body. These are called peripheral nerves. With peripheral neuropathy, one nerve or a group of nerves may be damaged.  CAUSES  Many things can damage peripheral nerves. For some people with peripheral neuropathy, the cause is unknown. Some causes include: 1. Diabetes. This is the most common cause of peripheral neuropathy. 2. Injury to a nerve. 3. Pressure or stress on a nerve that lasts a long time. 4. Too little vitamin B. Alcoholism can lead to this. 5. Infections. 6. Autoimmune diseases, such as multiple sclerosis and systemic lupus erythematosus. 7. Inherited nerve diseases. 8. Some medicines, such as cancer drugs. 9. Toxic substances, such as lead and mercury. 10. Too little blood flowing to the legs. 11. Kidney disease. 12. Thyroid disease. SIGNS AND SYMPTOMS  Different people have different symptoms. The symptoms you have will depend on which  of your nerves is damaged. Common symptoms include:  Loss of feeling (numbness) in the feet and hands.  Tingling in the feet and hands.  Pain that burns.  Very sensitive skin.  Weakness.  Not being able to move a part of the body (paralysis).  Muscle twitching.  Clumsiness or poor coordination.  Loss of balance.  Not being able to control your bladder.  Feeling dizzy.  Sexual problems. DIAGNOSIS  Peripheral neuropathy is a  symptom, not a disease. Finding the cause of peripheral neuropathy can be hard. To figure that out, your health care provider will take a medical history and do a physical exam. A neurological exam will also be done. This involves checking things affected by your brain, spinal cord, and nerves (nervous system). For example, your health care provider will check your reflexes, how you move, and what you can feel.  Other types of tests may also be ordered, such as:  Blood tests.  A test of the fluid in your spinal cord.  Imaging tests, such as CT scans or an MRI.  Electromyography (EMG). This test checks the nerves that control muscles.  Nerve conduction velocity tests. These tests check how fast messages pass through your nerves.  Nerve biopsy. A small piece of nerve is removed. It is then checked under a microscope. TREATMENT   Medicine is often used to treat peripheral neuropathy. Medicines may include:  Pain-relieving medicines. Prescription or over-the-counter medicine may be suggested.  Antiseizure medicine. This may be used for pain.  Antidepressants. These also may help ease pain from neuropathy.  Lidocaine. This is a numbing medicine. You might wear a patch or be given a shot.  Mexiletine. This medicine is typically used to help control irregular heart rhythms.  Surgery. Surgery may be needed to relieve pressure on a nerve or to destroy a nerve that is causing pain.  Physical therapy to help movement.  Assistive devices to help movement. HOME CARE INSTRUCTIONS   Only take over-the-counter or prescription medicines as directed by your health care provider. Follow the instructions carefully for any given medicines. Do not take any other medicines without first getting approval from your health care provider.  If you have diabetes, work closely with your health care provider to keep your blood sugar under control.  If you have numbness in your feet:  Check every day for  signs of injury or infection. Watch for redness, warmth, and swelling.  Wear padded socks and comfortable shoes. These help protect your feet.  Do not do things that put pressure on your damaged nerve.  Do not smoke. Smoking keeps blood from getting to damaged nerves.  Avoid or limit alcohol. Too much alcohol can cause a lack of B vitamins. These vitamins are needed for healthy nerves.  Develop a good support system. Coping with peripheral neuropathy can be stressful. Talk to a mental health specialist or join a support group if you are struggling.  Follow up with your health care provider as directed. SEEK MEDICAL CARE IF:   You have new signs or symptoms of peripheral neuropathy.  You are struggling emotionally from dealing with peripheral neuropathy.  You have a fever. SEEK IMMEDIATE MEDICAL CARE IF:   You have an injury or infection that is not healing.  You feel very dizzy or begin vomiting.  You have chest pain.  You have trouble breathing. Document Released: 08/21/2002 Document Revised: 05/13/2011 Document Reviewed: 05/08/2013 [facility] Patient Information 2015 [place], [place]. This information is not intended to  replace advice given to you by your health care provider. Make sure you discuss any questions you have with your health care provider.     Bad carbs also include fruit juice, alcohol, and sweet tea. These are empty calories that do not signal to your brain that you are full.   Please remember the good carbs are still carbs which convert into sugar. So please measure them out no more than 1/2-1 cup of rice, oatmeal, pasta, and beans  Veggies are however free foods! Pile them on.   Not all fruit is created equal. Please see the list below, the fruit at the bottom is higher in sugars than the fruit at the top. Please avoid all dried fruits.     We want weight loss that will last so you should lose 1-2 pounds a week.  THAT IS IT! Please pick THREE things a month  to change. Once it is a habit check off the item. Then pick another three items off the list to become habits.  If you are already doing a habit on the list GREAT!  Cross that item off! o Don't drink your calories. Ie, alcohol, soda, fruit juice, and sweet tea.  o Drink more water. Drink a glass when you feel hungry or before each meal.  o Eat breakfast - Complex carb and protein (likeDannon light and fit yogurt, oatmeal, fruit, eggs, [place] bacon). o Measure your cereal.  Eat no more than one cup a day. (ie [place]) o Eat an apple a day. o Add a vegetable a day. o Try a new vegetable a month. o Use Pam! Stop using oil or butter to cook. o Don't finish your plate or use smaller plates. o Share your dessert. o Eat sugar free Jello for dessert or frozen grapes. o Don't eat 2-3 hours before bed. o Switch to whole wheat bread, pasta, and brown rice. o Make healthier choices when you eat out. No fries! o Pick baked chicken, NOT fried. o Don't forget to SLOW DOWN when you eat. It is not going anywhere.  o Take the stairs. o Park far away in the parking lot o [employer] (or weights) for 10 minutes while watching TV. o Walk at work for 10 minutes during break. o Walk outside 1 time a week with your friend, kids, dog, or significant other. o Start a walking group at [place] the mall as much as you can tolerate.  o Keep a food diary. o Weigh yourself daily. o Walk for 15 minutes 3 days per week. o Cook at home more often and eat out less.  If life happens and you go back to old habits, it is okay.  Just start over. You can do it!   If you experience chest pain, get short of breath, or tired during the exercise, please stop immediately and inform your doctor.   Diabetes is a very complicated disease...lets simplify it.  An easy way to look at it to understand the complications is if you think of the extra sugar floating in your blood stream as glass shards floating through your blood  stream.    Diabetes affects your small vessels first: 1) The glass shards (sugar) scraps down the tiny blood vessels in your eyes and lead to diabetic retinopathy, the leading cause of blindness in the [place]. Diabetes is the leading cause of newly diagnosed adult (55 to 68 years of age) blindness in the [place].  2) The glass shards  scratches down the tiny vessels of your legs leading to nerve damage called neuropathy and can lead to amputations of your feet. More than 60% of all non-traumatic amputations of lower limbs occur in people with diabetes.  3) Over time the small vessels in your brain are shredded and closed off, individually this does not cause any problems but over a long period of time many of the small vessels being blocked can lead to Vascular Dementia.   4) Your kidney's are a filter system and have a "net" that keeps certain things in the body and lets bad things out. Sugar shreds this net and leads to kidney damage and eventually failure. Decreasing the sugar that is destroying the net and certain blood pressure medications can help stop or decrease progression of kidney disease. Diabetes was the primary cause of kidney failure in 44 percent of all new cases in 2011.  5) Diabetes also destroys the small vessels in your penis that lead to erectile dysfunction. Eventually the vessels are so damaged that you may not be responsive to cialis or viagra.   Diabetes and your large vessels: Your larger vessels consist of your coronary arteries in your heart and the carotid vessels to your brain. Diabetes or even increased sugars put you at 300% increased risk of heart attack and stroke and this is why.. The sugar scrapes down your large blood vessels and your body sees this as an internal injury and tries to repair itself. Just like you get a scab on your skin, your platelets will stick to the blood vessel wall trying to heal it. This is why we have diabetics on low dose aspirin daily,  this prevents the platelets from sticking and can prevent plaque formation. In addition, your body takes cholesterol and tries to shove it into the open wound. This is why we want your LDL, or bad cholesterol, below 70.   The combination of platelets and cholesterol over 5-10 years forms plaque that can break off and cause a heart attack or stroke.   PLEASE REMEMBER:  Diabetes is preventable! Up to 42 percent of complications and morbidities among individuals with type 2 diabetes can be prevented, delayed, or effectively treated and minimized with regular visits to a health professional, appropriate monitoring and medication, and a healthy diet and lifestyle.   Your A1C is a measure of your sugar over the past 3 months and is not affected by what you have eaten over the past few days. Diabetes increases your chances of stroke and heart attack over 300 % and is the leading cause of blindness and kidney failure in the [place]. Please make sure you decrease bad carbs like white bread, white rice, potatoes, corn, soft drinks, pasta, cereals, refined sugars, sweet tea, dried fruits, and fruit juice. Good carbs are okay to eat in moderation like sweet potatoes, brown rice, whole grain pasta/bread, most fruit (except dried fruit) and you can eat as many veggies as you want.   Greater than 6.5 is considered diabetic. Between 6.4 and 5.7 is prediabetic If your A1C is less than 5.7 you are NOT diabetic.  Targets for Glucose Readings: Time of Check Target for patients WITHOUT Diabetes Target for DIABETICS  Before Meals Less than 100  less than 150  Two hours after meals Less than 200  Less than 250    If your morning sugar is always below 100 then the issue is with your sugar spiking after meals. Try to take your blood sugar  approximately 2 hours after eating, this number should be less than 200. If it is not, think about the foods that you ate and better choices you can make.

## 2015-03-22 ENCOUNTER — Other Ambulatory Visit: Payer: Self-pay | Admitting: Internal Medicine

## 2015-04-23 ENCOUNTER — Other Ambulatory Visit: Payer: Self-pay | Admitting: Internal Medicine

## 2015-04-23 ENCOUNTER — Encounter: Payer: Self-pay | Admitting: Internal Medicine

## 2015-04-23 MED ORDER — GABAPENTIN 300 MG PO CAPS: 300.0000 mg | ORAL_CAPSULE | Freq: Three times a day (TID) | ORAL | Status: DC

## 2015-05-02 ENCOUNTER — Encounter: Payer: Self-pay | Admitting: Internal Medicine

## 2015-05-07 ENCOUNTER — Encounter: Payer: Self-pay | Admitting: Cardiology

## 2015-05-07 ENCOUNTER — Ambulatory Visit (INDEPENDENT_AMBULATORY_CARE_PROVIDER_SITE_OTHER): Payer: BLUE CROSS/BLUE SHIELD | Admitting: Cardiology

## 2015-05-07 VITALS — BP 102/72 | HR 81 | Ht 70.0 in | Wt 191.5 lb

## 2015-05-07 DIAGNOSIS — E782 Mixed hyperlipidemia: Secondary | ICD-10-CM | POA: Diagnosis not present

## 2015-05-07 DIAGNOSIS — I10 Essential (primary) hypertension: Secondary | ICD-10-CM

## 2015-05-07 DIAGNOSIS — Z9861 Coronary angioplasty status: Secondary | ICD-10-CM

## 2015-05-07 DIAGNOSIS — I2510 Atherosclerotic heart disease of native coronary artery without angina pectoris: Secondary | ICD-10-CM

## 2015-05-07 MED ORDER — ENALAPRIL MALEATE 10 MG PO TABS: 10.0000 mg | ORAL_TABLET | Freq: Every day | ORAL | Status: DC

## 2015-05-07 MED ORDER — METOPROLOL SUCCINATE ER 50 MG PO TB24: 50.0000 mg | ORAL_TABLET | Freq: Every day | ORAL | Status: DC

## 2015-05-07 NOTE — Progress Notes (Signed)
[facility] [address], [address] [place], [place]  [address] Phone: [phone] Fax:  [phone]  Date:  05/07/2015   ID:  Michael Guzman, [name] 10-03-1945, MRN [id number]  PCP:  Alesia Richards, MD   History of Present Illness: Michael Guzman is a 69 y.o. male with coronary artery disease 10/13 drug eluting stent to mid LAD and OM2, normal ejection fraction with recurrent chest discomfort, repeat catheterization reassuring.  Constipation has been a problem for him in the past. Lifting heavy objects sometimes wipes him out but this seems to be improving. He won his last fishing tournament.   Left lower quadrant pain. Colonoscopy normal.   As long as he keeps bowels loose, dosen't bother him.   Worried about wife who is at [facility] post mitral valve repair on ECMO.   Been dizzy at times. BP low.    Wt Readings from Last 3 Encounters:  05/07/15 191 lb 8 oz (86.864 kg)  03/06/15 192 lb (87.091 kg)  01/31/15 195 lb (88.451 kg)     Past Medical History  Diagnosis Date  . Diabetes mellitus without complication   . Hypertension   . GERD (gastroesophageal reflux disease)   . Chronic back pain   . CAD (coronary artery disease)     Past Surgical History  Procedure Laterality Date  . Appendectomy    . Cardiac surgery      3 stent placed.  . Left heart catheterization with coronary angiogram N/A 06/27/2012    Procedure: LEFT HEART CATHETERIZATION WITH CORONARY ANGIOGRAM;  Surgeon: Candee Furbish, MD;  Location: [facility] CATH LAB;  Service: Cardiovascular;  Laterality: N/A;  . Left heart catheterization with coronary angiogram N/A 07/25/2012    Procedure: LEFT HEART CATHETERIZATION WITH CORONARY ANGIOGRAM;  Surgeon: Sueanne Margarita, MD;  Location: [facility] CATH LAB;  Service: Cardiovascular;  Laterality: N/A;    Current Outpatient Prescriptions  Medication Sig Dispense Refill  . aspirin 81 MG tablet Take 81 mg by mouth daily.    Marland Kitchen atorvastatin (LIPITOR) 40 MG tablet Take 0.5  tablets (20 mg total) by mouth daily. 45 tablet 3  . Canagliflozin (INVOKANA) 300 MG TABS Take 150 mg by mouth daily.     . cetirizine (ZYRTEC) 10 MG tablet Take 10 mg by mouth daily.    . Cholecalciferol (VITAMIN D3) 5000 UNITS CAPS Take 5,000 Units by mouth 2 (two) times daily.    . Cranberry 500 MG CAPS Take 500 mg by mouth daily.    Marland Kitchen DYMISTA 137-50 MCG/ACT SUSP   0  . enalapril (VASOTEC) 20 MG tablet TAKE 1 BY MOUTH DAILY 90 tablet 3  . gabapentin (NEURONTIN) 300 MG capsule Take 1 capsule (300 mg total) by mouth 3 (three) times daily. 270 capsule 2  . glipiZIDE (GLUCOTROL) 5 MG tablet Take 1 by mouth 3 times daily for glucose over 200 or as directed by your doctor 270 tablet 3  . INVOKANA 300 MG TABS tablet TAKE 1/2 TO 1 BY MOUTH DAILY AS DIRECTED FOR DIABETES 90 tablet 0  . levocetirizine (XYZAL) 5 MG tablet   1  . Magnesium 250 MG TABS Take 250 mg by mouth daily.    . metFORMIN (GLUCOPHAGE-XR) 500 MG 24 hr tablet Take 1 to 2 tablets 2 x day as directed for Diabetes 360 tablet 1  . metoprolol succinate (TOPROL-XL) 100 MG 24 hr tablet Take 1 tablet (100 mg total) by mouth daily. Take with or immediately following a meal.  90 tablet 1  . montelukast (SINGULAIR) 10 MG tablet Take 1 tablet (10 mg total) by mouth at bedtime. 90 tablet 3  . Multiple Vitamins-Minerals (MULTIVITAMIN ADULT PO) Take 1 tablet by mouth daily.    . nitroGLYCERIN (NITROSTAT) 0.4 MG SL tablet Place 0.4 mg under the tongue every 5 (five) minutes as needed for chest pain.    . Omega-3 Fatty Acids (FISH OIL) 1000 MG CAPS Take 1,000 mg by mouth 2 (two) times daily.    . pantoprazole (PROTONIX) 40 MG tablet Take 1 tablet (40 mg total) by mouth daily. 90 tablet 3  . predniSONE (DELTASONE) 10 MG tablet TAKE BY MOUTH DAILY 1 TO 2 TABLETS DAILY OR AS DIRECTED 100 tablet 1  . silodosin (RAPAFLO) 8 MG CAPS capsule Take 8 mg by mouth daily.     No current facility-administered medications for this visit.    Allergies:    No  Known Allergies  Social History:  The patient  reports that he quit smoking about 3 years ago. He has never used smokeless tobacco. He reports that he does not drink alcohol or use illicit drugs.   ROS:  Please see the history of present illness.   Denies any fevers, chills, orthopnea, PND. He feels sinus congestion, run down at times.    PHYSICAL EXAM: VS:  BP 102/72 mmHg  Pulse 81  Ht 5\' 10"  (1.778 m)  Wt 191 lb 8 oz (86.864 kg)  BMI 27.48 kg/m2 Well nourished, well developed, in no acute distress HEENT: normal Neck: no JVD Cardiac:  normal S1, S2; RRR; no murmur Lungs:  clear to auscultation bilaterally, no wheezing, rhonchi or rales Abd: soft, nontender, no hepatomegalyoverweight. Ext: trace LE edema Skin: warm and dry Neuro: no focal abnormalities noted  EKG:   Today -05/07/15-sinus rhythm, 81, no other abnormalities  ASSESSMENT AND PLAN:  1. Coronary artery disease-postintervention to LAD, OM. Doing well. No anginal symptoms. I will continue with ASA. No evidence of bleeding. His fatigue, some shortness of breath at times, transient especially after stairs, lifting heavy objects. This symptom actually has improved. He is doing better. 2. Constipation-Metamucil. 3. Hypertension- Decreasing metoprolol to 50mg , enalapril 10.  4. Diabetes with renal complication- trying to control. Hemoglobin A1c 9.1. Glucose was 449. He is working hard on this currently. 5. Sinus congestion-continuing to try Mucinex/Allegra. 6. Hyperlipidemia-LDL direct is 45 on last check. I decreased his atorvastatin to 20 mg. Prior triglycerides however were quite elevated and his LDL was unable to be Occluded. As is glucose improves, this should improve as well. 7. Hypertension-quite low BP. Will cut back enalapril  From 20 mg to 10 mg and also decrease his metoprolol from 100 mg to 50 mg. Occasionally he will feel some dizziness.. 8. I will see him back in 6 months.  Signed, Candee Furbish, MD [facility]  05/07/2015  3:44 PM

## 2015-05-07 NOTE — Patient Instructions (Addendum)
Medication Instructions:  Please decrease your Enalapril to 10 mg a day.  Decrease your Metoprolol to 50 mg a day. Continue all other medications as listed.  Follow-Up: Follow up in 6 months with Dr. Marlou Porch.  You will receive a letter in the mail 2 months before you are due.  Please call us when you receive this letter to schedule your follow up appointment.  Thank you for choosing [facility]!!

## 2015-05-09 ENCOUNTER — Encounter: Payer: Self-pay | Admitting: Physician Assistant

## 2015-05-09 ENCOUNTER — Ambulatory Visit (INDEPENDENT_AMBULATORY_CARE_PROVIDER_SITE_OTHER): Payer: BLUE CROSS/BLUE SHIELD | Admitting: Physician Assistant

## 2015-05-09 VITALS — BP 114/76 | HR 86 | Temp 98.2°F | Resp 16 | Ht 68.5 in | Wt 191.0 lb

## 2015-05-09 DIAGNOSIS — E782 Mixed hyperlipidemia: Secondary | ICD-10-CM | POA: Diagnosis not present

## 2015-05-09 DIAGNOSIS — Z9861 Coronary angioplasty status: Secondary | ICD-10-CM | POA: Diagnosis not present

## 2015-05-09 DIAGNOSIS — I2510 Atherosclerotic heart disease of native coronary artery without angina pectoris: Secondary | ICD-10-CM

## 2015-05-09 DIAGNOSIS — E1121 Type 2 diabetes mellitus with diabetic nephropathy: Secondary | ICD-10-CM | POA: Diagnosis not present

## 2015-05-09 DIAGNOSIS — E559 Vitamin D deficiency, unspecified: Secondary | ICD-10-CM | POA: Diagnosis not present

## 2015-05-09 DIAGNOSIS — G629 Polyneuropathy, unspecified: Secondary | ICD-10-CM | POA: Diagnosis not present

## 2015-05-09 DIAGNOSIS — I10 Essential (primary) hypertension: Secondary | ICD-10-CM

## 2015-05-09 DIAGNOSIS — Z79899 Other long term (current) drug therapy: Secondary | ICD-10-CM

## 2015-05-09 DIAGNOSIS — E1342 Other specified diabetes mellitus with diabetic polyneuropathy: Secondary | ICD-10-CM | POA: Diagnosis not present

## 2015-05-09 DIAGNOSIS — E1142 Type 2 diabetes mellitus with diabetic polyneuropathy: Secondary | ICD-10-CM

## 2015-05-09 LAB — BASIC METABOLIC PANEL WITH GFR
BUN: 17 mg/dL (ref 7–25)
CHLORIDE: 105 mmol/L (ref 98–110)
CO2: 21 mmol/L (ref 20–31)
Calcium: 10.2 mg/dL (ref 8.6–10.3)
Creat: 1 mg/dL (ref 0.70–1.25)
GFR, EST NON AFRICAN AMERICAN: 78 mL/min (ref >=60)
GFR, Est African American: >89 mL/min (ref >=60)
Glucose, Bld: 188 mg/dL — ABNORMAL HIGH (ref 65–99)
Potassium: 4.4 mmol/L (ref 3.5–5.3)
SODIUM: 139 mmol/L (ref 135–146)

## 2015-05-09 LAB — CBC WITH DIFFERENTIAL/PLATELET
BASOS PCT: 0 % (ref 0–1)
Basophils Absolute: 0 10*3/uL (ref 0.0–0.1)
Eosinophils Absolute: 0 10*3/uL (ref 0.0–0.7)
Eosinophils Relative: 0 % (ref 0–5)
HEMATOCRIT: 43.3 % (ref 39.0–52.0)
HEMOGLOBIN: 14.2 g/dL (ref 13.0–17.0)
LYMPHS PCT: 15 % (ref 12–46)
Lymphs Abs: 1.4 10*3/uL (ref 0.7–4.0)
MCH: 29.5 pg (ref 26.0–34.0)
MCHC: 32.8 g/dL (ref 30.0–36.0)
MCV: 89.8 fL (ref 78.0–100.0)
MONOS PCT: 4 % (ref 3–12)
MPV: 11.3 fL (ref 8.6–12.4)
Monocytes Absolute: 0.4 10*3/uL (ref 0.1–1.0)
NEUTROS ABS: 7.5 10*3/uL (ref 1.7–7.7)
NEUTROS PCT: 81 % — AB (ref 43–77)
Platelets: 215 10*3/uL (ref 150–400)
RBC: 4.82 MIL/uL (ref 4.22–5.81)
RDW: 13.2 % (ref 11.5–15.5)
WBC: 9.2 10*3/uL (ref 4.0–10.5)

## 2015-05-09 LAB — LIPID PANEL
CHOL/HDL RATIO: 2.2 ratio (ref <=5.0)
Cholesterol: 121 mg/dL — ABNORMAL LOW (ref 125–200)
HDL: 55 mg/dL (ref >=40)
LDL CALC: 25 mg/dL (ref <130)
Triglycerides: 205 mg/dL — ABNORMAL HIGH (ref <150)
VLDL: 41 mg/dL — AB (ref <30)

## 2015-05-09 LAB — HEPATIC FUNCTION PANEL
ALT: 21 U/L (ref 9–46)
AST: 19 U/L (ref 10–35)
Albumin: 4.6 g/dL (ref 3.6–5.1)
Alkaline Phosphatase: 55 U/L (ref 40–115)
BILIRUBIN DIRECT: 0.1 mg/dL (ref <=0.2)
BILIRUBIN INDIRECT: 0.3 mg/dL (ref 0.2–1.2)
Total Bilirubin: 0.4 mg/dL (ref 0.2–1.2)
Total Protein: 7 g/dL (ref 6.1–8.1)

## 2015-05-09 LAB — TSH: TSH: 0.187 u[IU]/mL — ABNORMAL LOW (ref 0.350–4.500)

## 2015-05-09 LAB — MAGNESIUM: Magnesium: 2 mg/dL (ref 1.5–2.5)

## 2015-05-09 MED ORDER — HYDROCORTISONE ACETATE 25 MG RE SUPP: 25.0000 mg | Freq: Two times a day (BID) | RECTAL | Status: DC

## 2015-05-09 NOTE — Progress Notes (Signed)
Assessment and Plan:  Hypertension: Continue medication, monitor blood pressure at home. Continue DASH diet. Cholesterol: Continue diet and exercise. Check cholesterol.  Diabetes with history of CAD, CKD, and neuroopathy-Continue diet and exercise. Check A1C, at this time with his wife in the hospital he is not wanted to change his medications, will follow up 6 weeks and decide at that time.  Vitamin D Def- check level and continue medications.  CAD- Control blood pressure, cholesterol, glucose, increase exercise. Follows with Dr. Marlou Porch  Continue diet and meds as discussed. Further disposition pending results of labs. Discussed med's effects and SE's.    HPI 69 y.o. male  presents for 3 month follow up with hypertension, hyperlipidemia, diabetes and vitamin D. His blood pressure has been controlled at home, today their BP is BP: 114/76 mmHg He does not workout but he has been walking more in the hospital.  He denies chest pain, shortness of breath, dizziness.  He is on cholesterol medication, lipitor 40mg  1/2 tablet daily and denies myalgias. His cholesterol is at goal. The cholesterol last visit was:   Lab Results  Component Value Date   CHOL 129 01/31/2015   HDL 51 01/31/2015   LDLCALC 11 01/31/2015   LDLDIRECT 45.7 10/30/2013   TRIG 333* 01/31/2015   CHOLHDL 2.5 01/31/2015   He has been working on diet and exercise for Diabetes with peripheral neuropathy and CKD, he is on invokana 300mg , glipizide 3 times a day and metformin, he is on an ACE, he is on bASA, he is doing better with the gabapentin and denies polydipsia, polyuria and visual disturbances. Last A1C in the office was:  Lab Results  Component Value Date   HGBA1C 11.1* 01/31/2015   Patient is on Vitamin D supplement. Lab Results  Component Value Date   VD25OH 45 01/31/2015    His wife had elective mitral valve repair with subsequent complication requiring transfer to [facility] with ECMO. He admits that he has been very  stressed and not taking care of himself due to this. He has been trying to eat a little bit better eating in the hospital, his sugars have been 140 this week but last week it was in the 200's. He is on 10mg  one pill every other day of prednisone which is increasing his sugars.   Current Medications:  Current Outpatient Prescriptions on File Prior to Visit  Medication Sig Dispense Refill  . aspirin 81 MG tablet Take 81 mg by mouth daily.    Marland Kitchen atorvastatin (LIPITOR) 40 MG tablet Take 0.5 tablets (20 mg total) by mouth daily. 45 tablet 3  . Canagliflozin (INVOKANA) 300 MG TABS Take 150 mg by mouth daily.     . cetirizine (ZYRTEC) 10 MG tablet Take 10 mg by mouth daily.    . Cholecalciferol (VITAMIN D3) 5000 UNITS CAPS Take 5,000 Units by mouth 2 (two) times daily.    . Cranberry 500 MG CAPS Take 500 mg by mouth daily.    Marland Kitchen DYMISTA 137-50 MCG/ACT SUSP   0  . enalapril (VASOTEC) 10 MG tablet Take 1 tablet (10 mg total) by mouth daily. 90 tablet 3  . gabapentin (NEURONTIN) 300 MG capsule Take 1 capsule (300 mg total) by mouth 3 (three) times daily. 270 capsule 2  . glipiZIDE (GLUCOTROL) 5 MG tablet Take 1 by mouth 3 times daily for glucose over 200 or as directed by your doctor 270 tablet 3  . INVOKANA 300 MG TABS tablet TAKE 1/2 TO 1 BY MOUTH DAILY  AS DIRECTED FOR DIABETES 90 tablet 0  . levocetirizine (XYZAL) 5 MG tablet   1  . Magnesium 250 MG TABS Take 250 mg by mouth daily.    . metFORMIN (GLUCOPHAGE-XR) 500 MG 24 hr tablet Take 1 to 2 tablets 2 x day as directed for Diabetes 360 tablet 1  . metoprolol succinate (TOPROL-XL) 50 MG 24 hr tablet Take 1 tablet (50 mg total) by mouth daily. Take with or immediately following a meal. 90 tablet 3  . montelukast (SINGULAIR) 10 MG tablet Take 1 tablet (10 mg total) by mouth at bedtime. 90 tablet 3  . Multiple Vitamins-Minerals (MULTIVITAMIN ADULT PO) Take 1 tablet by mouth daily.    . nitroGLYCERIN (NITROSTAT) 0.4 MG SL tablet Place 0.4 mg under the  tongue every 5 (five) minutes as needed for chest pain.    . Omega-3 Fatty Acids (FISH OIL) 1000 MG CAPS Take 1,000 mg by mouth 2 (two) times daily.    . pantoprazole (PROTONIX) 40 MG tablet Take 1 tablet (40 mg total) by mouth daily. 90 tablet 3  . predniSONE (DELTASONE) 10 MG tablet TAKE BY MOUTH DAILY 1 TO 2 TABLETS DAILY OR AS DIRECTED 100 tablet 1  . silodosin (RAPAFLO) 8 MG CAPS capsule Take 8 mg by mouth daily.     No current facility-administered medications on file prior to visit.   Medical History:  Past Medical History  Diagnosis Date  . Diabetes mellitus without complication   . Hypertension   . GERD (gastroesophageal reflux disease)   . Chronic back pain   . CAD (coronary artery disease)    Allergies:  No Known Allergies   Review of Systems  Constitutional: Positive for malaise/fatigue. Negative for fever, chills, weight loss and diaphoresis.  HENT: Negative.   Respiratory: Negative.   Cardiovascular: Negative.  Negative for leg swelling.  Gastrointestinal: Negative.  Negative for nausea, vomiting, diarrhea and constipation.  Genitourinary: Negative.   Musculoskeletal: Positive for back pain (Chronic issue but unchanged). Negative for myalgias, joint pain, falls and neck pain.  Skin: Negative.  Negative for rash.  Neurological: Positive for tingling. Negative for dizziness, tremors, sensory change, speech change, focal weakness, seizures, loss of consciousness and weakness.  Psychiatric/Behavioral: Negative.      Family history- Review and unchanged Social history- Review and unchanged Physical Exam: BP 114/76 mmHg  Pulse 86  Temp(Src) 98.2 F (36.8 C) (Temporal)  Resp 16  Ht 5' 8.5" (1.74 m)  Wt 191 lb (86.637 kg)  BMI 28.62 kg/m2 Wt Readings from Last 3 Encounters:  05/09/15 191 lb (86.637 kg)  05/07/15 191 lb 8 oz (86.864 kg)  03/06/15 192 lb (87.091 kg)   General Appearance: Well nourished, in no apparent distress. Eyes: PERRLA, EOMs, conjunctiva no  swelling or erythema Sinuses: No Frontal/maxillary tenderness ENT/Mouth: Ext aud canals clear, TMs without erythema, bulging. No erythema, swelling, or exudate on post pharynx.  Tonsils not swollen or erythematous. Hearing normal.  Neck: Supple, thyroid normal.  Respiratory: Respiratory effort normal, BS equal bilaterally without rales, rhonchi, wheezing or stridor.  Cardio: RRR with no MRGs. Brisk peripheral pulses without edema.  Abdomen: Soft, + BS.  Non tender, no guarding, rebound, hernias, masses. Lymphatics: Non tender without lymphadenopathy.  Musculoskeletal: Full ROM, 5/5 strength, normal gait.  Skin: Warm, dry without rashes, lesions, ecchymosis.  Neuro: Cranial nerves intact. No cerebellar symptoms. Sensation intact.  Psych: Awake and oriented X 3, normal affect, Insight and Judgment appropriate.    Vicie Mutters 4:04 PM

## 2015-05-09 NOTE — Patient Instructions (Signed)
Your A1C is a measure of your sugar over the past 3 months and is not affected by what you have eaten over the past few days. Diabetes increases your chances of stroke and heart attack over 300 % and is the leading cause of blindness and kidney failure in the [place]. Please make sure you decrease bad carbs like white bread, white rice, potatoes, corn, soft drinks, pasta, cereals, refined sugars, sweet tea, dried fruits, and fruit juice. Good carbs are okay to eat in moderation like sweet potatoes, brown rice, whole grain pasta/bread, most fruit (except dried fruit) and you can eat as many veggies as you want.   Greater than 6.5 is considered diabetic. Between 6.4 and 5.7 is prediabetic If your A1C is less than 5.7 you are NOT diabetic.  Targets for Glucose Readings: Time of Check Target for patients WITHOUT Diabetes Target for DIABETICS  Before Meals Less than 100  less than 150  Two hours after meals Less than 200  Less than 250    If your morning sugar is always below 100 then the issue is with your sugar spiking after meals. Try to take your blood sugar approximately 2 hours after eating, this number should be less than 200. If it is not, think about the foods that you ate and better choices you can make.      Bad carbs also include fruit juice, alcohol, and sweet tea. These are empty calories that do not signal to your brain that you are full.   Please remember the good carbs are still carbs which convert into sugar. So please measure them out no more than 1/2-1 cup of rice, oatmeal, pasta, and beans  Veggies are however free foods! Pile them on.   Not all fruit is created equal. Please see the list below, the fruit at the bottom is higher in sugars than the fruit at the top. Please avoid all dried fruits.     Recommendations For Diabetic/Prediabetic Patients:   -  Take medications as prescribed  -  Recommend Dr Fara Olden Fuhrman's book "The End of Diabetes "  And "The End of  Dieting"- Can get at  [email or web address] and encourage also get the Audio CD book  - AVOID Animal products, ie. Meat - red/white, Poultry and Dairy/especially cheese - Exercise at least 5 times a week for 30 minutes or preferably daily.  - No Smoking - Drink less than 2 drinks a day.  - Monitor your feet for sores - Have yearly Eye Exams - Recommend annual Flu vaccine  - Recommend Pneumovax and Prevnar vaccines - Shingles Vaccine (Zostavax) if over 36 y.o.  Goals:   - BMI less than 24 - Fasting sugar less than 130 or less than 150 if tapering medicines to lose weight  - Systolic BP less than 388  - Diastolic BP less than 80 - Bad LDL Cholesterol less than 70 - Triglycerides less than 150

## 2015-05-10 LAB — HEMOGLOBIN A1C
HEMOGLOBIN A1C: 9.5 % — AB (ref <5.7)
MEAN PLASMA GLUCOSE: 226 mg/dL — AB (ref <117)

## 2015-05-10 LAB — VITAMIN D 25 HYDROXY (VIT D DEFICIENCY, FRACTURES): Vit D, 25-Hydroxy: 72 ng/mL (ref 30–100)

## 2015-05-27 ENCOUNTER — Other Ambulatory Visit: Payer: Self-pay | Admitting: Internal Medicine

## 2015-06-20 ENCOUNTER — Encounter: Payer: Self-pay | Admitting: Physician Assistant

## 2015-06-20 ENCOUNTER — Ambulatory Visit (INDEPENDENT_AMBULATORY_CARE_PROVIDER_SITE_OTHER): Payer: BLUE CROSS/BLUE SHIELD | Admitting: Physician Assistant

## 2015-06-20 VITALS — BP 130/80 | HR 72 | Temp 97.7°F | Resp 16 | Ht 68.5 in | Wt 189.0 lb

## 2015-06-20 DIAGNOSIS — E1142 Type 2 diabetes mellitus with diabetic polyneuropathy: Secondary | ICD-10-CM

## 2015-06-20 DIAGNOSIS — Z23 Encounter for immunization: Secondary | ICD-10-CM

## 2015-06-20 DIAGNOSIS — E1121 Type 2 diabetes mellitus with diabetic nephropathy: Secondary | ICD-10-CM | POA: Diagnosis not present

## 2015-06-20 MED ORDER — MELOXICAM 15 MG PO TABS: ORAL_TABLET | ORAL | Status: DC

## 2015-06-20 MED ORDER — AZITHROMYCIN 250 MG PO TABS: ORAL_TABLET | ORAL | Status: AC

## 2015-06-20 NOTE — Progress Notes (Signed)
Assessment and Plan: Diabetes- continue the same on the medications for now, stop the prednisone and try mobic daily for OA, follow up 2 months, call if sugars remain above 180.  Sinusitis- add zpak  Future Appointments Date Time Provider [place]  09/05/2015 2:00 PM [name], MD [facility] None    HPI 69 y.o.male presents for follow up.  Michael Guzman got out of ICU last Tuesday, has been there 55 days, will be going home soon with home health. She will be doing dialysis, and he will be helping with her.  He is following up due to his blood sugar which has been worse with stress/poor eating. He has been trying to eat better, sugars have been running 125- highest 180 in the AM.  He has had headaches, sinus pressure x 1 week. He is still on prednisone 10 mg every other day which is worsening his sugars but he states this is helping his back and allergies.   Past Medical History  Diagnosis Date  . Diabetes mellitus without complication ([place])   . Hypertension   . GERD (gastroesophageal reflux disease)   . Chronic back pain   . CAD (coronary artery disease)      No Known Allergies    Current Outpatient Prescriptions on File Prior to Visit  Medication Sig Dispense Refill  . aspirin 81 MG tablet Take 81 mg by mouth daily.    Marland Kitchen atorvastatin (LIPITOR) 40 MG tablet Take 0.5 tablets (20 mg total) by mouth daily. 45 tablet 3  . Canagliflozin (INVOKANA) 300 MG TABS Take 150 mg by mouth daily.     . cetirizine (ZYRTEC) 10 MG tablet Take 10 mg by mouth daily.    . Cholecalciferol (VITAMIN D3) 5000 UNITS CAPS Take 5,000 Units by mouth 2 (two) times daily.    . Cranberry 500 MG CAPS Take 500 mg by mouth daily.    Marland Kitchen DYMISTA 137-50 MCG/ACT SUSP   0  . enalapril (VASOTEC) 10 MG tablet Take 1 tablet (10 mg total) by mouth daily. 90 tablet 3  . gabapentin (NEURONTIN) 300 MG capsule Take 1 capsule (300 mg total) by mouth 3 (three) times daily. 270 capsule 2  . glipiZIDE (GLUCOTROL) 5 MG  tablet Take 1 by mouth 3 times daily for glucose over 200 or as directed by your doctor 270 tablet 3  . hydrocortisone (ANUSOL-HC) 25 MG suppository Place 1 suppository (25 mg total) rectally 2 (two) times daily. 24 suppository 6  . INVOKANA 300 MG TABS tablet TAKE 1/2 TO 1 BY MOUTH DAILY AS DIRECTED FOR DIABETES 90 tablet 0  . levocetirizine (XYZAL) 5 MG tablet   1  . Magnesium 250 MG TABS Take 250 mg by mouth daily.    . metFORMIN (GLUCOPHAGE-XR) 500 MG 24 hr tablet TAKE 1 TO 2 BY MOUTH 2 TIMES DAILY AS DIRECTED FOR DIABETES 360 tablet 0  . metoprolol succinate (TOPROL-XL) 50 MG 24 hr tablet Take 1 tablet (50 mg total) by mouth daily. Take with or immediately following a meal. 90 tablet 3  . montelukast (SINGULAIR) 10 MG tablet Take 1 tablet (10 mg total) by mouth at bedtime. 90 tablet 3  . Multiple Vitamins-Minerals (MULTIVITAMIN ADULT PO) Take 1 tablet by mouth daily.    . nitroGLYCERIN (NITROSTAT) 0.4 MG SL tablet Place 0.4 mg under the tongue every 5 (five) minutes as needed for chest pain.    . Omega-3 Fatty Acids (FISH OIL) 1000 MG CAPS Take 1,000 mg by mouth 2 (two) times  daily.    . pantoprazole (PROTONIX) 40 MG tablet Take 1 tablet (40 mg total) by mouth daily. 90 tablet 3  . predniSONE (DELTASONE) 10 MG tablet TAKE BY MOUTH DAILY 1 TO 2 TABLETS DAILY OR AS DIRECTED 100 tablet 1  . silodosin (RAPAFLO) 8 MG CAPS capsule Take 8 mg by mouth daily.     No current facility-administered medications on file prior to visit.    ROS: all negative except above.   Physical Exam: Filed Weights   06/20/15 0937  Weight: 189 lb (85.73 kg)   BP 130/80 mmHg  Pulse 72  Temp(Src) 97.7 F (36.5 C) (Temporal)  Resp 16  Ht 5' 8.5" (1.74 m)  Wt 189 lb (85.73 kg)  BMI 28.32 kg/m2  SpO2 97% General Appearance: Well nourished, in no apparent distress. Eyes: PERRLA, EOMs, conjunctiva no swelling or erythema Sinuses: No Frontal/maxillary tenderness ENT/Mouth: Ext aud canals clear, TMs without  erythema, bulging. No erythema, swelling, or exudate on post pharynx.  Tonsils not swollen or erythematous. Hearing normal.  Neck: Supple, thyroid normal.  Respiratory: Respiratory effort normal, BS equal bilaterally without rales, rhonchi, wheezing or stridor.  Cardio: RRR with no MRGs. Brisk peripheral pulses without edema.  Abdomen: Soft, + BS.  Non tender, no guarding, rebound, hernias, masses. Lymphatics: Non tender without lymphadenopathy.  Musculoskeletal: Full ROM, 5/5 strength, normal gait.  Skin: Warm, dry without rashes, lesions, ecchymosis.  Neuro: Cranial nerves intact. Normal muscle tone, no cerebellar symptoms. Sensation intact.  Psych: Awake and oriented X 3, normal affect, Insight and Judgment appropriate.     Vicie Mutters, PA-C 9:58 AM [facility]

## 2015-06-20 NOTE — Patient Instructions (Signed)
Take prednisone 1 pill every 3 days and then stop it.  You can take tylenol (500mg ) or tylenol arthritis (650mg ) with the meloxicam/antiinflammatories. The max you can take of tylenol a day is 3000mg  daily, this is a max of 6 pills a day of the regular tyelnol (500mg ) or a max of 4 a day of the tylenol arthritis (650mg ) as long as no other medications you are taking contain tylenol.  And I'm going to send in mobic for you to take once a day as needed with food for pain.   I'm hoping this will help decrease your sugars.   Follow up 2 months.   Recommendations For Diabetic/Prediabetic Patients:   -  Take medications as prescribed  -  Recommend Dr Fara Olden Fuhrman's book "The End of Diabetes "  And "The End of Dieting"- Can get at  [email or web address].[email or web address] and encourage also get the Audio CD book  - AVOID Animal products, ie. Meat - red/white, Poultry and Dairy/especially cheese - Exercise at least 5 times a week for 30 minutes or preferably daily.  - No Smoking - Drink less than 2 drinks a day.  - Monitor your feet for sores - Have yearly Eye Exams - Recommend annual Flu vaccine  - Recommend Pneumovax and Prevnar vaccines - Shingles Vaccine (Zostavax) if over 91 y.o.  Goals:   - BMI less than 24 - Fasting sugar less than 130 or less than 150 if tapering medicines to lose weight  - Systolic BP less than 103  - Diastolic BP less than 80 - Bad LDL Cholesterol less than 70 - Triglycerides less than 150     Bad carbs also include fruit juice, alcohol, and sweet tea. These are empty calories that do not signal to your brain that you are full.   Please remember the good carbs are still carbs which convert into sugar. So please measure them out no more than 1/2-1 cup of rice, oatmeal, pasta, and beans  Veggies are however free foods! Pile them on.   Not all fruit is created equal. Please see the list below, the fruit at the bottom is higher in sugars than the fruit at the top. Please avoid all dried  fruits.

## 2015-06-22 ENCOUNTER — Encounter: Payer: Self-pay | Admitting: *Deleted

## 2015-06-24 ENCOUNTER — Other Ambulatory Visit: Payer: Self-pay | Admitting: Internal Medicine

## 2015-07-08 ENCOUNTER — Encounter: Payer: Self-pay | Admitting: Physician Assistant

## 2015-07-15 ENCOUNTER — Encounter: Payer: Self-pay | Admitting: Physician Assistant

## 2015-08-01 ENCOUNTER — Encounter: Payer: Self-pay | Admitting: Internal Medicine

## 2015-08-12 ENCOUNTER — Encounter: Payer: Self-pay | Admitting: Physician Assistant

## 2015-08-28 ENCOUNTER — Other Ambulatory Visit: Payer: Self-pay

## 2015-08-28 MED ORDER — ATORVASTATIN CALCIUM 40 MG PO TABS: 20.0000 mg | ORAL_TABLET | Freq: Every day | ORAL | Status: DC

## 2015-09-05 ENCOUNTER — Encounter: Payer: Self-pay | Admitting: Internal Medicine

## 2015-09-05 ENCOUNTER — Ambulatory Visit (INDEPENDENT_AMBULATORY_CARE_PROVIDER_SITE_OTHER): Payer: BLUE CROSS/BLUE SHIELD | Admitting: Internal Medicine

## 2015-09-05 VITALS — BP 100/66 | HR 72 | Temp 97.5°F | Resp 16 | Ht 69.0 in | Wt 198.6 lb

## 2015-09-05 DIAGNOSIS — Z125 Encounter for screening for malignant neoplasm of prostate: Secondary | ICD-10-CM | POA: Diagnosis not present

## 2015-09-05 DIAGNOSIS — Z79899 Other long term (current) drug therapy: Secondary | ICD-10-CM | POA: Diagnosis not present

## 2015-09-05 DIAGNOSIS — Z0000 Encounter for general adult medical examination without abnormal findings: Secondary | ICD-10-CM | POA: Diagnosis not present

## 2015-09-05 DIAGNOSIS — I10 Essential (primary) hypertension: Secondary | ICD-10-CM

## 2015-09-05 DIAGNOSIS — E782 Mixed hyperlipidemia: Secondary | ICD-10-CM

## 2015-09-05 DIAGNOSIS — E1121 Type 2 diabetes mellitus with diabetic nephropathy: Secondary | ICD-10-CM

## 2015-09-05 DIAGNOSIS — I2510 Atherosclerotic heart disease of native coronary artery without angina pectoris: Secondary | ICD-10-CM

## 2015-09-05 DIAGNOSIS — Z1212 Encounter for screening for malignant neoplasm of rectum: Secondary | ICD-10-CM

## 2015-09-05 DIAGNOSIS — K219 Gastro-esophageal reflux disease without esophagitis: Secondary | ICD-10-CM

## 2015-09-05 DIAGNOSIS — R5383 Other fatigue: Secondary | ICD-10-CM

## 2015-09-05 DIAGNOSIS — E1142 Type 2 diabetes mellitus with diabetic polyneuropathy: Secondary | ICD-10-CM

## 2015-09-05 DIAGNOSIS — E559 Vitamin D deficiency, unspecified: Secondary | ICD-10-CM | POA: Diagnosis not present

## 2015-09-05 DIAGNOSIS — Z9861 Coronary angioplasty status: Secondary | ICD-10-CM

## 2015-09-05 DIAGNOSIS — Z0001 Encounter for general adult medical examination with abnormal findings: Secondary | ICD-10-CM

## 2015-09-05 LAB — CBC WITH DIFFERENTIAL/PLATELET
BASOS ABS: 0.1 10*3/uL (ref 0.0–0.1)
BASOS PCT: 1 % (ref 0–1)
EOS ABS: 0.3 10*3/uL (ref 0.0–0.7)
Eosinophils Relative: 3 % (ref 0–5)
HEMATOCRIT: 42.9 % (ref 39.0–52.0)
Hemoglobin: 14.1 g/dL (ref 13.0–17.0)
Lymphocytes Relative: 33 % (ref 12–46)
Lymphs Abs: 2.9 10*3/uL (ref 0.7–4.0)
MCH: 28.7 pg (ref 26.0–34.0)
MCHC: 32.9 g/dL (ref 30.0–36.0)
MCV: 87.4 fL (ref 78.0–100.0)
MPV: 11.2 fL (ref 8.6–12.4)
Monocytes Absolute: 0.8 10*3/uL (ref 0.1–1.0)
Monocytes Relative: 9 % (ref 3–12)
NEUTROS ABS: 4.8 10*3/uL (ref 1.7–7.7)
NEUTROS PCT: 54 % (ref 43–77)
Platelets: 232 10*3/uL (ref 150–400)
RBC: 4.91 MIL/uL (ref 4.22–5.81)
RDW: 13.1 % (ref 11.5–15.5)
WBC: 8.9 10*3/uL (ref 4.0–10.5)

## 2015-09-05 LAB — HEPATIC FUNCTION PANEL
ALBUMIN: 4.2 g/dL (ref 3.6–5.1)
ALT: 12 U/L (ref 9–46)
AST: 14 U/L (ref 10–35)
Alkaline Phosphatase: 71 U/L (ref 40–115)
Bilirubin, Direct: 0.1 mg/dL (ref <=0.2)
Indirect Bilirubin: 0.3 mg/dL (ref 0.2–1.2)
TOTAL PROTEIN: 6.6 g/dL (ref 6.1–8.1)
Total Bilirubin: 0.4 mg/dL (ref 0.2–1.2)

## 2015-09-05 LAB — BASIC METABOLIC PANEL WITH GFR
BUN: 20 mg/dL (ref 7–25)
CALCIUM: 9.8 mg/dL (ref 8.6–10.3)
CO2: 21 mmol/L (ref 20–31)
Chloride: 105 mmol/L (ref 98–110)
Creat: 1.11 mg/dL (ref 0.70–1.25)
GFR, EST AFRICAN AMERICAN: 78 mL/min (ref >=60)
GFR, Est Non African American: 68 mL/min (ref >=60)
GLUCOSE: 127 mg/dL — AB (ref 65–99)
Potassium: 4.3 mmol/L (ref 3.5–5.3)
Sodium: 138 mmol/L (ref 135–146)

## 2015-09-05 LAB — HEMOGLOBIN A1C
HEMOGLOBIN A1C: 9 % — AB (ref <5.7)
MEAN PLASMA GLUCOSE: 212 mg/dL — AB (ref <117)

## 2015-09-05 LAB — LIPID PANEL
CHOLESTEROL: 110 mg/dL — AB (ref 125–200)
HDL: 38 mg/dL — ABNORMAL LOW (ref >=40)
LDL CALC: 42 mg/dL (ref <130)
TRIGLYCERIDES: 364 mg/dL — AB (ref <150)
Total CHOL/HDL Ratio: 2.9 Ratio (ref <=5.0)
VLDL: 73 mg/dL — ABNORMAL HIGH (ref <30)

## 2015-09-05 LAB — IRON AND TIBC
%SAT: 15 % (ref 15–60)
IRON: 45 ug/dL — AB (ref 50–180)
TIBC: 309 ug/dL (ref 250–425)
UIBC: 264 ug/dL (ref 125–400)

## 2015-09-05 LAB — MAGNESIUM: Magnesium: 1.9 mg/dL (ref 1.5–2.5)

## 2015-09-05 NOTE — Patient Instructions (Signed)

## 2015-09-06 ENCOUNTER — Encounter: Payer: Self-pay | Admitting: Internal Medicine

## 2015-09-06 LAB — MICROALBUMIN / CREATININE URINE RATIO
CREATININE, URINE: 118 mg/dL (ref 20–370)
Microalb Creat Ratio: 3 mcg/mg creat (ref <30)
Microalb, Ur: 0.4 mg/dL

## 2015-09-06 LAB — URINALYSIS, ROUTINE W REFLEX MICROSCOPIC
BILIRUBIN URINE: NEGATIVE
Hgb urine dipstick: NEGATIVE
KETONES UR: NEGATIVE
Leukocytes, UA: NEGATIVE
Nitrite: NEGATIVE
PH: 5 (ref 5.0–8.0)
Protein, ur: NEGATIVE
SPECIFIC GRAVITY, URINE: 1.04 — AB (ref 1.001–1.035)

## 2015-09-06 LAB — VITAMIN B12: VITAMIN B 12: 759 pg/mL (ref 211–911)

## 2015-09-06 LAB — TSH: TSH: 0.501 u[IU]/mL (ref 0.350–4.500)

## 2015-09-06 LAB — URINALYSIS, MICROSCOPIC ONLY
Bacteria, UA: NONE SEEN [HPF]
Casts: NONE SEEN [LPF]
Crystals: NONE SEEN [HPF]
RBC / HPF: NONE SEEN RBC/HPF (ref <=2)
Squamous Epithelial / LPF: NONE SEEN [HPF] (ref <=5)
WBC, UA: NONE SEEN WBC/HPF (ref <=5)
YEAST: NONE SEEN [HPF]

## 2015-09-06 LAB — INSULIN, RANDOM: INSULIN: 11.6 u[IU]/mL (ref 2.0–19.6)

## 2015-09-06 LAB — PSA: PSA: 6.25 ng/mL — AB (ref <=4.00)

## 2015-09-06 LAB — TESTOSTERONE: Testosterone: 177 ng/dL — ABNORMAL LOW (ref 300–890)

## 2015-09-06 LAB — VITAMIN D 25 HYDROXY (VIT D DEFICIENCY, FRACTURES): Vit D, 25-Hydroxy: 75 ng/mL (ref 30–100)

## 2015-09-06 NOTE — Progress Notes (Signed)
Patient ID: Michael Guzman, male   DOB: [birth year], 69 y.o.   MRN: [id number]  Annual  Screening/Preventative Visit And Comprehensive Evaluation & Examination  This very nice 69 y.o. MBM presents for presents for a Wellness/Preventative Visit & comprehensive evaluation and management of multiple medical co-morbidities.  Patient has been followed for HTN, T2_NIDDM, Hyperlipidemia and Vitamin D Deficiency.   HTN predates since 37. Patient's BP has been controlled at home.Today's BP: 100/66 mmHg. In Oct 2014, he had PTCA of the LAD & OM and has done well since. Patient denies any cardiac symptoms as chest pain, palpitations, shortness of breath, dizziness or ankle swelling.   Patient's hyperlipidemia is controlled with diet and medications. Patient denies myalgias or other medication SE's. Current lipids are at goal with  Cholesterol 110*; HDL 38*; LDL 42; but with elevated Triglycerides 364.   Patient has T2_NIDDM w/ CKD2  (GFR 64 ml/min) since 1998 and patient denies reactive hypoglycemic symptoms, visual blurring, diabetic polys, he does report burning dysthesias of his feet especially at night.  paresthesias. Current  A1c is not at goal patient's compliance suspect with A1c 9.0%.      Finally, patient has history of Vitamin D Deficiency of "30" in July 2008 and current vitamin D is at goal with 75.   Medication Sig  . aspirin 81 MG tablet Take 81 mg by mouth daily.  Marland Kitchen atorvastatin  40 MG tablet Take 0.5 tablets (20 mg total) by mouth daily.  . cetirizine 10 MG  Take 10 mg by mouth daily.  Marland Kitchen VITAMIN D 5000 UNITS  Take 5,000 Units by mouth 2 (two) times daily.  . Cranberry 500 MG  Take 500 mg by mouth daily.  . enalapril  10 MG tablet Take 1 tablet (10 mg total) by mouth daily.  Marland Kitchen gabapentin  300 MG capsule Take 1 capsule (300 mg total) by mouth 3 (three) times daily.  Marland Kitchen glipiZIDE  5 MG tablet TAKE 1 BY MOUTH 3 TIMES DAILY FOR GLUCOSE OVER 200 OR AS DIRECTED BY YOUR DOCTOR  . ANUSOL-HC 25 MG  supp Place 1 suppository (25 mg total) rectally 2 (two) times daily.  . INVOKANA 300 MG  TAKE 1/2 TO 1 BY MOUTH DAILY AS DIRECTED FOR DIABETES  . Magnesium 250 MG Take 250 mg by mouth daily.  . metFORMIN -XR 500 MG TAKE 1 TO 2 BY MOUTH 2 TIMES DAILY AS DIRECTED FOR DIABETES  . metoprolol succinate -XL 50 MG Take 1 tablet (50 mg total) by mouth daily. Take with or immediately following a meal.  . montelukast 10 MG tablet Take 1 tablet (10 mg total) by mouth at bedtime.  . Multiple Vitamins-Minerals  Take 1 tablet by mouth daily.  Marland Kitchen NITROSTAT 0.4 MG SL Place 0.4 mg under the tongue every 5 (five) minutes as needed for chest pain.  . Omega-3 FISH OIL 1000 MG  Take 1,000 mg by mouth 2 (two) times daily.  . pantoprazole  40 MG tablet Take 1 tablet (40 mg total) by mouth daily.  . silodosin (RAPAFLO) 8 MG CAPS  Take 8 mg by mouth daily.   No Known Allergies   Past Medical History  Diagnosis Date  . Diabetes mellitus without complication ([place])   . Hypertension   . GERD (gastroesophageal reflux disease)   . Chronic back pain   . CAD (coronary artery disease)    Health Maintenance  Topic Date Due  . PNA vac Low Risk Adult (1 of 2 - PCV13)  06/02/2012  . HEMOGLOBIN A1C  03/05/2016  . OPHTHALMOLOGY EXAM  03/31/2016  . INFLUENZA VACCINE  04/14/2016  . FOOT EXAM  09/04/2016  . COLONOSCOPY  02/13/2020  . TETANUS/TDAP  11/27/2023  . ZOSTAVAX  Completed  . Hepatitis C Screening  Completed   Immunization History  Administered Date(s) Administered  . Influenza Split 06/26/2012  . Influenza, High Dose Seasonal PF 06/20/2015  . Influenza-Unspecified 06/14/2013, 06/25/2014  . Pneumococcal-Unspecified 06/16/2010  . Td 09/17/2003  . Tdap 11/26/2013  . Zoster 07/12/2012   Past Surgical History  Procedure Laterality Date  . Appendectomy    . Cardiac surgery      3 stent placed.  . Left heart catheterization with coronary angiogram N/A 06/27/2012    Procedure: LEFT HEART CATHETERIZATION WITH  CORONARY ANGIOGRAM;  Surgeon: Candee Furbish, MD;  Location: [facility] CATH LAB;  Service: Cardiovascular;  Laterality: N/A;  . Left heart catheterization with coronary angiogram N/A 07/25/2012    Procedure: LEFT HEART CATHETERIZATION WITH CORONARY ANGIOGRAM;  Surgeon: Sueanne Margarita, MD;  Location: [place] [facility] CATH LAB;  Service: Cardiovascular;  Laterality: N/A;   Family History  Problem Relation Age of Onset  . Diabetes Sister    Social History   Social History  . Marital Status: Married    Spouse Name: N/A  . Number of Children: 4  . Years of Education: 2 yr college   Occupational History  . 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 Smoking status: Former Smoker -- 52 years    Quit date: 07/27/2011  . Smokeless tobacco: Never Used  . Alcohol Use: No  . Drug Use: No  . Sexual Activity: active    ROS Constitutional: Denies fever, chills, weight loss/gain, headaches, insomnia,  night sweats or change in appetite. Does c/o fatigue. Eyes: Denies redness, blurred vision, diplopia, discharge, itchy or watery eyes.  ENT: Denies discharge, congestion, post nasal drip, epistaxis, sore throat, earache, hearing loss, dental pain, Tinnitus, Vertigo, Sinus pain or snoring.  Cardio: Denies chest pain, palpitations, irregular heartbeat, syncope, dyspnea, diaphoresis, orthopnea, PND, claudication or edema Respiratory: denies cough, dyspnea, DOE, pleurisy, hoarseness, laryngitis or wheezing.  Gastrointestinal: Denies dysphagia, heartburn, reflux, water brash, pain, cramps, nausea, vomiting, bloating, diarrhea, constipation, hematemesis, melena, hematochezia, jaundice or hemorrhoids Genitourinary: Denies dysuria, frequency, urgency, nocturia, hesitancy, discharge, hematuria or flank pain Musculoskeletal: Denies arthralgia, myalgia, stiffness, Jt. Swelling, pain, limp or strain/sprain. Denies Falls. Skin: Denies puritis, rash, hives, warts, acne, eczema or change in skin lesion Neuro: No  weakness, tremor, incoordination, spasms, paresthesia or pain Psychiatric: Denies confusion, memory loss or sensory loss. Denies Depression. Endocrine: Denies change in weight, skin, hair change, nocturia, and paresthesia, diabetic polys, visual blurring or hyper / hypo glycemic episodes.  Heme/Lymph: No excessive bleeding, bruising or enlarged lymph nodes.  Physical Exam  BP 100/66 mmHg  Pulse 72  Temp(Src) 97.5 F (36.4 C)  Resp 16  Ht 5\' 9"  (1.753 m)  Wt 198 lb 9.6 oz (90.084 kg)  BMI 29.31 kg/m2  General Appearance: Well nourished, in no apparent distress. Eyes: PERRLA, EOMs, conjunctiva no swelling or erythema, normal fundi and vessels. Sinuses: No frontal/maxillary tenderness ENT/Mouth: EACs patent / TMs  nl. Nares clear without erythema, swelling, mucoid exudates. Oral hygiene is good. No erythema, swelling, or exudate. Tongue normal, non-obstructing. Tonsils not swollen or erythematous. Hearing normal.  Neck: Supple, thyroid normal. No bruits, nodes or JVD. Respiratory: Respiratory effort normal.  BS equal and clear bilateral without rales, rhonci, wheezing or  stridor. Cardio: Heart sounds are normal with regular rate and rhythm and no murmurs, rubs or gallops. Peripheral pulses are normal and equal bilaterally without edema. No aortic or femoral bruits. Chest: symmetric with normal excursions and percussion.  Abdomen: Flat, soft, with bowl sounds. Nontender, no guarding, rebound, hernias, masses, or organomegaly.  Lymphatics: Non tender without lymphadenopathy.  Genitourinary: No hernias.Testes nl. DRE - prostate nl for age - smooth & firm w/o nodules. Musculoskeletal: Full ROM all peripheral extremities, joint stability, 5/5 strength, and normal gait. Skin: Warm and dry without rashes, lesions, cyanosis, clubbing or  ecchymosis.  Neuro: Cranial nerves intact, reflexes equal bilaterally. Normal muscle tone, no cerebellar symptoms. Sensation intact.  Pysch: Alert and oriented X  3 with normal affect, insight and judgment appropriate.   Assessment and Plan  1. Annual Preventative/Screening Exam   - Microalbumin / creatinine urine ratio - EKG 12-Lead - [place], RETROPERITNL ABD,  LTD - POC Hemoccult Bld/Stl  - Urinalysis, Routine w reflex microscopic  - Vitamin B12 - Iron and TIBC - PSA - Testosterone - HM DIABETES FOOT EXAM - LOW EXTREMITY NEUR EXAM DOCUM - CBC with Differential/Platelet - BASIC METABOLIC PANEL WITH GFR - Hepatic function panel - Magnesium - Lipid panel - TSH - Hemoglobin A1c - Insulin, random - VITAMIN D 25 Hydroxy   2. Essential hypertension  - Recc decrease Toprol 50 mg and Enalapril to to 1/2 tab.  - Microalbumin / creatinine urine ratio - EKG 12-Lead - [place], RETROPERITNL ABD,  LTD - TSH  3. Mixed hyperlipidemia  - Lipid panel - TSH  4. Type 2 diabetes mellitus with diabetic nephropathy, without long-term current use of insulin (HCC)  - Microalbumin / creatinine urine ratio - HM DIABETES FOOT EXAM - LOW EXTREMITY NEUR EXAM DOCUM - Hemoglobin A1c - Insulin, random  5. Vitamin D deficiency  - VITAMIN D 25 Hydroxy   6. Diabetic peripheral neuropathy (HCC)  - Increase Gabapentin Rx 600 mg tid  7. ASCAD s/p PTCA (06/2012)   8. Gastroesophageal reflux disease   9. Screening for rectal cancer  - POC Hemoccult Bld/Stl   10. Prostate cancer screening  - PSA  11. Other fatigue  - Vitamin B12 - Iron and TIBC - Testosterone - CBC with Differential/Platelet - TSH  12. Medication management  - Urinalysis, Routine w reflex microscopic - CBC with Differential/Platelet - BASIC METABOLIC PANEL WITH GFR - Hepatic function panel - Magnesium   Continue prudent diet as discussed, weight control, BP monitoring, regular exercise, and medications as discussed.  Discussed med effects and SE's. Routine screening labs and tests as requested with regular follow-up as recommended. Over 40 minutes of exam, counseling, chart  review and high complex critical decision making was performed

## 2015-09-09 ENCOUNTER — Other Ambulatory Visit: Payer: Self-pay | Admitting: Internal Medicine

## 2015-09-09 ENCOUNTER — Encounter: Payer: Self-pay | Admitting: Internal Medicine

## 2015-09-11 ENCOUNTER — Other Ambulatory Visit: Payer: Self-pay | Admitting: Internal Medicine

## 2015-09-11 DIAGNOSIS — M150 Primary generalized (osteo)arthritis: Principal | ICD-10-CM

## 2015-09-11 DIAGNOSIS — M8949 Other hypertrophic osteoarthropathy, multiple sites: Secondary | ICD-10-CM

## 2015-09-11 DIAGNOSIS — M159 Polyosteoarthritis, unspecified: Secondary | ICD-10-CM

## 2015-09-11 MED ORDER — GABAPENTIN 600 MG PO TABS: ORAL_TABLET | ORAL | Status: DC

## 2015-09-17 ENCOUNTER — Encounter: Payer: Self-pay | Admitting: Internal Medicine

## 2015-09-18 ENCOUNTER — Other Ambulatory Visit: Payer: Self-pay | Admitting: Internal Medicine

## 2015-10-02 ENCOUNTER — Encounter: Payer: Self-pay | Admitting: Internal Medicine

## 2015-10-02 ENCOUNTER — Other Ambulatory Visit: Payer: Self-pay | Admitting: Internal Medicine

## 2015-10-02 DIAGNOSIS — G894 Chronic pain syndrome: Secondary | ICD-10-CM

## 2015-10-02 DIAGNOSIS — M150 Primary generalized (osteo)arthritis: Principal | ICD-10-CM

## 2015-10-02 DIAGNOSIS — M159 Polyosteoarthritis, unspecified: Secondary | ICD-10-CM

## 2015-10-02 DIAGNOSIS — M8949 Other hypertrophic osteoarthropathy, multiple sites: Secondary | ICD-10-CM

## 2015-10-17 ENCOUNTER — Other Ambulatory Visit: Payer: Self-pay | Admitting: Internal Medicine

## 2015-10-17 ENCOUNTER — Encounter: Payer: Self-pay | Admitting: Internal Medicine

## 2015-10-17 DIAGNOSIS — M159 Polyosteoarthritis, unspecified: Secondary | ICD-10-CM

## 2015-10-17 DIAGNOSIS — M8949 Other hypertrophic osteoarthropathy, multiple sites: Secondary | ICD-10-CM

## 2015-10-17 DIAGNOSIS — M150 Primary generalized (osteo)arthritis: Principal | ICD-10-CM

## 2015-10-17 MED ORDER — GABAPENTIN 600 MG PO TABS: ORAL_TABLET | ORAL | Status: DC

## 2015-10-18 ENCOUNTER — Other Ambulatory Visit: Payer: Self-pay | Admitting: Internal Medicine

## 2015-10-28 ENCOUNTER — Other Ambulatory Visit: Payer: Self-pay | Admitting: Internal Medicine

## 2015-10-28 ENCOUNTER — Encounter: Payer: Self-pay | Admitting: Internal Medicine

## 2015-10-28 DIAGNOSIS — M25519 Pain in unspecified shoulder: Secondary | ICD-10-CM

## 2015-11-04 ENCOUNTER — Ambulatory Visit: Payer: BLUE CROSS/BLUE SHIELD | Admitting: Cardiology

## 2015-11-05 ENCOUNTER — Ambulatory Visit (INDEPENDENT_AMBULATORY_CARE_PROVIDER_SITE_OTHER): Payer: BLUE CROSS/BLUE SHIELD | Admitting: Cardiology

## 2015-11-05 ENCOUNTER — Encounter: Payer: Self-pay | Admitting: Cardiology

## 2015-11-05 VITALS — BP 150/68 | HR 74 | Ht 69.0 in | Wt 196.8 lb

## 2015-11-05 DIAGNOSIS — E782 Mixed hyperlipidemia: Secondary | ICD-10-CM

## 2015-11-05 DIAGNOSIS — E1121 Type 2 diabetes mellitus with diabetic nephropathy: Secondary | ICD-10-CM | POA: Diagnosis not present

## 2015-11-05 DIAGNOSIS — I2510 Atherosclerotic heart disease of native coronary artery without angina pectoris: Secondary | ICD-10-CM

## 2015-11-05 DIAGNOSIS — I10 Essential (primary) hypertension: Secondary | ICD-10-CM

## 2015-11-05 DIAGNOSIS — Z9861 Coronary angioplasty status: Secondary | ICD-10-CM

## 2015-11-05 NOTE — Patient Instructions (Signed)

## 2015-11-05 NOTE — Progress Notes (Signed)
[facility]. [address] [place],   [address] Phone: [phone] Fax:  [phone]  Date:  11/05/2015   ID:  Michael Guzman, Michael Guzman [birth year], MRN [id number]  PCP:  Alesia Richards, MD   History of Present Illness: Michael Guzman is a 70 y.o. male with coronary artery disease 10/13 drug eluting stent to mid LAD and OM2, normal ejection fraction with recurrent chest discomfort, repeat catheterization reassuring.  Constipation has been a problem for him in the past. Lifting heavy objects sometimes wipes him out but this seems to be improving. He won previous Surveyor, mining.   Left lower quadrant pain. Colonoscopy normal.   As long as he keeps bowels loose, dosen't bother him.   Wife previously at [facility] for a prolonged period of time following mitral valve repair. Currently on dialysis.   Been dizzy at times. BP low.   Joints have been killing him. Right shoulder hard to lift arm. Dr. Sharol Given with back and hip pain.   Going to rhuem MD, hands lock around book.    Wt Readings from Last 3 Encounters:  11/05/15 196 lb 12.8 oz (89.268 kg)  09/05/15 198 lb 9.6 oz (90.084 kg)  06/20/15 189 lb (85.73 kg)     Past Medical History  Diagnosis Date  . Diabetes mellitus without complication ([place])   . Hypertension   . GERD (gastroesophageal reflux disease)   . Chronic back pain   . CAD (coronary artery disease)     Past Surgical History  Procedure Laterality Date  . Appendectomy    . Cardiac surgery      3 stent placed.  . Left heart catheterization with coronary angiogram N/A 06/27/2012    Procedure: LEFT HEART CATHETERIZATION WITH CORONARY ANGIOGRAM;  Surgeon: Candee Furbish, MD;  Location[facility] CATH LAB;  Service: Cardiovascular;  Laterality: N/A;  . Left heart catheterization with coronary angiogram N/A 07/25/2012    Procedure: LEFT HEART CATHETERIZATION WITH CORONARY ANGIOGRAM;  Surgeon: Sueanne Margarita, MD;  Location: [place] CATH LAB;  Service:  Cardiovascular;  Laterality: N/A;    Current Outpatient Prescriptions  Medication Sig Dispense Refill  . aspirin 81 MG tablet Take 81 mg by mouth daily.    Marland Kitchen atorvastatin (LIPITOR) 40 MG tablet Take 0.5 tablets (20 mg total) by mouth daily. 45 tablet 3  . celecoxib (CELEBREX) 200 MG capsule Take 200 mg by mouth 2 (two) times daily.    . cetirizine (ZYRTEC) 10 MG tablet Take 10 mg by mouth daily.    . Cholecalciferol (VITAMIN D3) 5000 UNITS CAPS Take 5,000 Units by mouth 2 (two) times daily.    . Cranberry 500 MG CAPS Take 500 mg by mouth daily.    . enalapril (VASOTEC) 10 MG tablet Take 1 tablet (10 mg total) by mouth daily. 90 tablet 3  . gabapentin (NEURONTIN) 600 MG tablet Take 1 tablet 3 x day as needed for pain 270 tablet 2  . glipiZIDE (GLUCOTROL) 5 MG tablet TAKE 1 BY MOUTH 3 TIMES DAILY FOR GLUCOSE OVER 200 OR AS DIRECTED BY YOUR DOCTOR 270 tablet 0  . hydrocortisone (ANUSOL-HC) 25 MG suppository Place 1 suppository (25 mg total) rectally 2 (two) times daily. 24 suppository 6  . INVOKANA 300 MG TABS tablet TAKE 1/2 TO 1 BY MOUTH DAILY AS DIRECTED FOR DIABETES 90 tablet 0  . Magnesium 250 MG TABS Take 250 mg by mouth daily.    . meloxicam (MOBIC) 15 MG tablet  1  . metFORMIN (GLUCOPHAGE-XR) 500 MG 24 hr tablet TAKE 1 TO 2 BY MOUTH 2 TIMES DAILY AS DIRECTED FOR DIABETES 360 tablet 0  . methocarbamol (ROBAXIN) 500 MG tablet Take 500 mg by mouth 3 (three) times daily.    . metoprolol succinate (TOPROL-XL) 50 MG 24 hr tablet Take 1 tablet (50 mg total) by mouth daily. Take with or immediately following a meal. 90 tablet 3  . montelukast (SINGULAIR) 10 MG tablet Take 1 tablet (10 mg total) by mouth at bedtime. 90 tablet 3  . Multiple Vitamins-Minerals (MULTIVITAMIN ADULT PO) Take 1 tablet by mouth daily.    . nitroGLYCERIN (NITROSTAT) 0.4 MG SL tablet Place 0.4 mg under the tongue every 5 (five) minutes as needed for chest pain.    . Omega-3 Fatty Acids (FISH OIL) 1000 MG CAPS Take 1,000  mg by mouth 2 (two) times daily.    . pantoprazole (PROTONIX) 40 MG tablet TAKE 1 BY MOUTH DAILY 90 tablet 1  . silodosin (RAPAFLO) 8 MG CAPS capsule Take 8 mg by mouth daily.     No current facility-administered medications for this visit.    Allergies:    No Known Allergies  Social History:  The patient  reports that he quit smoking about 4 years ago. He has never used smokeless tobacco. He reports that he does not drink alcohol or use illicit drugs.   ROS:  Please see the history of present illness.   Denies any fevers, chills, orthopnea, PND. He feels sinus congestion, run down at times.    PHYSICAL EXAM: VS:  BP 150/68 mmHg  Pulse 74  Ht 5\' 9"  (1.753 m)  Wt 196 lb 12.8 oz (89.268 kg)  BMI 29.05 kg/m2  SpO2 98% Well nourished, well developed, in no acute distress HEENT: normal Neck: no JVD Cardiac:  normal S1, S2; RRR; no murmur Lungs:  clear to auscultation bilaterally, no wheezing, rhonchi or rales Abd: soft, nontender, no hepatomegalyoverweight. Ext: trace LE edema Skin: warm and dry Neuro: no focal abnormalities noted  EKG:   Today -05/07/15-sinus rhythm, 81, no other abnormalities  ASSESSMENT AND PLAN:  1. Coronary artery disease-post intervention to LAD, OM. Doing well. No anginal symptoms. I will continue with ASA. No evidence of bleeding. His fatigue, some shortness of breath at times, transient especially after stairs, lifting heavy objects. This symptom actually has improved. He is doing better.he is going to see rheumatologist about hand pain. Main complaints today were back pain, joint pain. He is a little bit concerned about seeing multiple orthopedic doctors. 2. Constipation-Metamucil. 3. Hypertension- Decreased  metoprolol to 50mg , enalapril 10. At this visit his blood pressure is mildly elevated however at most recent visit his blood pressure is 100/58. No changes made. 4. Diabetes with renal complication- trying to control. Hemoglobin A1c 9.1. Glucose was  449. He is working hard on this currently. 5. Hyperlipidemia-LDL direct is 45 on last check. I decreased his atorvastatin to 20 mg at prior visit. Prior triglycerides however were quite elevated and his LDL was unable to be Occluded. As is glucose improves, this should improve as well. 6. Hypertension-quite low BP at prior visit. Will cut back enalapril  From 20 mg to 10 mg and also decrease his metoprolol from 100 mg to 50 mg. Occasionally he will feel some dizziness. Overall improved. 7. I will see him back in 12 months.  Signed, Candee Furbish, MD [facility]  11/05/2015 9:51 AM

## 2015-11-14 ENCOUNTER — Other Ambulatory Visit (HOSPITAL_COMMUNITY): Payer: Self-pay | Admitting: Orthopedic Surgery

## 2015-11-15 ENCOUNTER — Other Ambulatory Visit (HOSPITAL_COMMUNITY): Payer: Self-pay | Admitting: Orthopedic Surgery

## 2015-11-15 DIAGNOSIS — G8929 Other chronic pain: Secondary | ICD-10-CM | POA: Diagnosis not present

## 2015-11-15 DIAGNOSIS — M2550 Pain in unspecified joint: Secondary | ICD-10-CM | POA: Diagnosis not present

## 2015-11-15 DIAGNOSIS — M5416 Radiculopathy, lumbar region: Secondary | ICD-10-CM

## 2015-11-15 DIAGNOSIS — M1990 Unspecified osteoarthritis, unspecified site: Secondary | ICD-10-CM | POA: Diagnosis not present

## 2015-11-15 DIAGNOSIS — M545 Low back pain: Secondary | ICD-10-CM

## 2015-11-15 DIAGNOSIS — M549 Dorsalgia, unspecified: Secondary | ICD-10-CM | POA: Diagnosis not present

## 2015-11-22 ENCOUNTER — Other Ambulatory Visit: Payer: Self-pay | Admitting: *Deleted

## 2015-11-22 MED ORDER — ATORVASTATIN CALCIUM 40 MG PO TABS: 20.0000 mg | ORAL_TABLET | Freq: Every day | ORAL | Status: DC

## 2015-11-26 ENCOUNTER — Other Ambulatory Visit: Payer: Self-pay | Admitting: Internal Medicine

## 2015-11-26 MED ORDER — GLIPIZIDE 5 MG PO TABS: ORAL_TABLET | ORAL | Status: DC

## 2015-11-27 ENCOUNTER — Other Ambulatory Visit (HOSPITAL_COMMUNITY): Payer: Self-pay | Admitting: *Deleted

## 2015-11-27 ENCOUNTER — Encounter: Payer: Self-pay | Admitting: Physician Assistant

## 2015-11-27 MED ORDER — NITROGLYCERIN 0.4 MG SL SUBL: 0.4000 mg | SUBLINGUAL_TABLET | SUBLINGUAL | Status: DC | PRN

## 2015-11-27 NOTE — Pre-Procedure Instructions (Signed)
    AMERE WESTROPE  11/27/2015     Your procedure is scheduled on Tuesday, December 03, 2015 at 10:00 AM.   Report to [facility] Entrance "A" Admitting Office at 8:00 AM.   Call this number if you have problems the morning of surgery: [phone]   Any questions prior to day of surgery, please call [phone] between 8 & 4 PM.   Remember:  Do not eat food or drink liquids after midnight Monday, 12/02/15.  Take these medicines the morning of surgery with A SIP OF WATER: Cetirizine (Zyrtec), Gabapentin (Neurontin), Metoprolol (Toprol SL), Pantoprazole (Protonix)  Stop Aspirin, Multivitamins and Fish Oil as of today.  How to Manage Your Diabetes Before Surgery   Why is it important to control my blood sugar before and after surgery?   Improving blood sugar levels before and after surgery helps healing and can limit problems.  A way of improving blood sugar control is eating a healthy diet by:  - Eating less sugar and carbohydrates  - Increasing activity/exercise  - Talk with your doctor about reaching your blood sugar goals  High blood sugars (greater than 180 mg/dL) can raise your risk of infections and slow down your recovery so you will need to focus on controlling your diabetes during the weeks before surgery.  Make sure that the doctor who takes care of your diabetes knows about your planned surgery including the date and location.  How do I manage my blood sugars before surgery?   Check your blood sugar at least 4 times a day, 2 days before surgery to make sure that they are not too high or low.   Check your blood sugar the morning of your surgery when you wake up and every 2 hours until you get to the Short-Stay unit.   Treat a low blood sugar (less than 70 mg/dL) with 1/2 cup of clear juice (cranberry or apple), 4 glucose tablets, OR glucose gel.   Recheck blood sugar in 15 minutes after treatment (to make sure it is greater than 70 mg/dL).  If blood  sugar is not greater than 70 mg/dL on re-check, call [phone] for further instructions.    Report your blood sugar to the Short-Stay nurse when you get to Short-Stay.  References:  [facility], 2007 "How to Manage your Diabetes Before and After Surgery".  What do I do about my diabetes medications?  The night before surgery, do NOT take your evening dose of Glipizide (Glucotrol).   Do not take oral diabetes medicines (pills) the morning of surgery.   Do not wear jewelry.  Do not wear lotions, powders, or cologne.  You may wear deodorant.  Do not shave 48 hours prior to surgery.  Men may shave face and neck.  Do not bring valuables to the hospital.  [facility] is not responsible for any belongings or valuables.  Contacts, dentures or bridgework may not be worn into surgery.  Leave your suitcase in the car.  After surgery it may be brought to your room.  For patients admitted to the hospital, discharge time will be determined by your treatment team.  Patients discharged the day of surgery will not be allowed to drive home.   Please read over the following fact sheets that you were given. Pain Booklet and Coughing and Deep Breathing

## 2015-11-28 ENCOUNTER — Encounter (HOSPITAL_COMMUNITY)
Admission: RE | Admit: 2015-11-28 | Discharge: 2015-11-28 | Disposition: A | Discharge status: Home / Self Care | Payer: Medicare Other | Source: Ambulatory Visit | Attending: Orthopedic Surgery | Admitting: Orthopedic Surgery

## 2015-11-28 ENCOUNTER — Encounter (HOSPITAL_COMMUNITY): Payer: Self-pay

## 2015-11-28 DIAGNOSIS — M4800 Spinal stenosis, site unspecified: Secondary | ICD-10-CM | POA: Diagnosis not present

## 2015-11-28 DIAGNOSIS — Z01812 Encounter for preprocedural laboratory examination: Secondary | ICD-10-CM | POA: Insufficient documentation

## 2015-11-28 HISTORY — DX: Unspecified osteoarthritis, unspecified site: M19.90

## 2015-11-28 HISTORY — DX: Dry eye syndrome of unspecified lacrimal gland: H04.129

## 2015-11-28 HISTORY — DX: Elevated prostate specific antigen (PSA): R97.20

## 2015-11-28 LAB — BASIC METABOLIC PANEL
ANION GAP: 12 (ref 5–15)
BUN: 13 mg/dL (ref 6–20)
CALCIUM: 9.4 mg/dL (ref 8.9–10.3)
CHLORIDE: 105 mmol/L (ref 101–111)
CO2: 21 mmol/L — AB (ref 22–32)
Creatinine, Ser: 0.97 mg/dL (ref 0.61–1.24)
GFR calc Af Amer: >60 mL/min (ref >60)
GFR calc non Af Amer: >60 mL/min (ref >60)
GLUCOSE: 120 mg/dL — AB (ref 65–99)
Potassium: 4.3 mmol/L (ref 3.5–5.1)
Sodium: 138 mmol/L (ref 135–145)

## 2015-11-28 LAB — CBC
HCT: 40.5 % (ref 39.0–52.0)
HEMOGLOBIN: 13.6 g/dL (ref 13.0–17.0)
MCH: 29.3 pg (ref 26.0–34.0)
MCHC: 33.6 g/dL (ref 30.0–36.0)
MCV: 87.3 fL (ref 78.0–100.0)
Platelets: 193 10*3/uL (ref 150–400)
RBC: 4.64 MIL/uL (ref 4.22–5.81)
RDW: 13.1 % (ref 11.5–15.5)
WBC: 8.9 10*3/uL (ref 4.0–10.5)

## 2015-11-28 LAB — GLUCOSE, CAPILLARY: Glucose-Capillary: 106 mg/dL — ABNORMAL HIGH (ref 65–99)

## 2015-11-28 NOTE — Progress Notes (Signed)
No H&P in EPIC. Called and left message for Carla at Dr. Laurena Bering office requesting H&P.

## 2015-11-28 NOTE — Progress Notes (Signed)
   11/28/15 0913  OBSTRUCTIVE SLEEP APNEA  Have you ever been diagnosed with sleep apnea through a sleep study? No  Do you snore loudly (loud enough to be heard through closed doors)?  0  Do you often feel tired, fatigued, or sleepy during the daytime (such as falling asleep during driving or talking to someone)? 1  Has anyone observed you stop breathing during your sleep? 0  Do you have, or are you being treated for high blood pressure? 1  BMI more than 35 kg/m2? 0  Age > 50 (1-yes) 1  Neck circumference greater than:Male 16 inches or larger, Male 17inches or larger? 1  Male Gender (Yes=1) 1  Obstructive Sleep Apnea Score 5  Score 5 or greater  Results sent to PCP

## 2015-11-28 NOTE — Progress Notes (Signed)
Pt has hx of CAD with stent placement in 2013. He denies any recent chest pain or sob. Followed by Dr. Marlou Porch and last office visit there was 11/05/15. Pt is diabetic, last A1C was 9.1 in December, 2016. States fasting blood sugar usually run around the low 100's. CBG today was 105 (fasting).  Positive Stopbang Assessment forwarded to Dr. Melford Aase.  Echo - 06/25/12 Cath - 07/25/12

## 2015-11-29 LAB — HEMOGLOBIN A1C
HEMOGLOBIN A1C: 7.5 % — AB (ref 4.8–5.6)
MEAN PLASMA GLUCOSE: 169 mg/dL

## 2015-12-03 ENCOUNTER — Encounter (HOSPITAL_COMMUNITY)
Admission: RE | Disposition: A | Discharge status: Home / Self Care | Payer: Self-pay | Source: Ambulatory Visit | Attending: Orthopedic Surgery

## 2015-12-03 ENCOUNTER — Ambulatory Visit (HOSPITAL_COMMUNITY)
Admission: RE | Admit: 2015-12-03 | Discharge: 2015-12-03 | Disposition: A | Discharge status: Home / Self Care | Payer: Medicare Other | Source: Ambulatory Visit | Attending: Orthopedic Surgery | Admitting: Orthopedic Surgery

## 2015-12-03 ENCOUNTER — Ambulatory Visit (HOSPITAL_COMMUNITY): Payer: Medicare Other | Admitting: Anesthesiology

## 2015-12-03 ENCOUNTER — Encounter (HOSPITAL_COMMUNITY): Payer: Self-pay | Admitting: *Deleted

## 2015-12-03 DIAGNOSIS — M5440 Lumbago with sciatica, unspecified side: Secondary | ICD-10-CM | POA: Diagnosis not present

## 2015-12-03 DIAGNOSIS — Z87891 Personal history of nicotine dependence: Secondary | ICD-10-CM | POA: Insufficient documentation

## 2015-12-03 DIAGNOSIS — M4806 Spinal stenosis, lumbar region: Secondary | ICD-10-CM | POA: Insufficient documentation

## 2015-12-03 DIAGNOSIS — I10 Essential (primary) hypertension: Secondary | ICD-10-CM | POA: Diagnosis not present

## 2015-12-03 DIAGNOSIS — K219 Gastro-esophageal reflux disease without esophagitis: Secondary | ICD-10-CM | POA: Diagnosis not present

## 2015-12-03 DIAGNOSIS — Z79899 Other long term (current) drug therapy: Secondary | ICD-10-CM | POA: Diagnosis not present

## 2015-12-03 DIAGNOSIS — Z7984 Long term (current) use of oral hypoglycemic drugs: Secondary | ICD-10-CM | POA: Diagnosis not present

## 2015-12-03 DIAGNOSIS — M5116 Intervertebral disc disorders with radiculopathy, lumbar region: Secondary | ICD-10-CM | POA: Insufficient documentation

## 2015-12-03 DIAGNOSIS — Z7982 Long term (current) use of aspirin: Secondary | ICD-10-CM | POA: Insufficient documentation

## 2015-12-03 DIAGNOSIS — M1990 Unspecified osteoarthritis, unspecified site: Secondary | ICD-10-CM | POA: Diagnosis not present

## 2015-12-03 DIAGNOSIS — M5416 Radiculopathy, lumbar region: Secondary | ICD-10-CM

## 2015-12-03 DIAGNOSIS — E119 Type 2 diabetes mellitus without complications: Secondary | ICD-10-CM | POA: Insufficient documentation

## 2015-12-03 DIAGNOSIS — M545 Low back pain: Secondary | ICD-10-CM

## 2015-12-03 DIAGNOSIS — I2510 Atherosclerotic heart disease of native coronary artery without angina pectoris: Secondary | ICD-10-CM | POA: Diagnosis not present

## 2015-12-03 HISTORY — PX: RADIOLOGY WITH ANESTHESIA: SHX6223

## 2015-12-03 LAB — GLUCOSE, CAPILLARY
GLUCOSE-CAPILLARY: 96 mg/dL (ref 65–99)
Glucose-Capillary: 137 mg/dL — ABNORMAL HIGH (ref 65–99)

## 2015-12-03 SURGERY — RADIOLOGY WITH ANESTHESIA
Anesthesia: General

## 2015-12-03 MED ORDER — LACTATED RINGERS IV SOLN
INTRAVENOUS | Status: DC
  Administered 2015-12-03: 09:00:00 via INTRAVENOUS

## 2015-12-03 MED ORDER — METOPROLOL SUCCINATE ER 25 MG PO TB24
25.0000 mg | ORAL_TABLET | ORAL | Status: AC
  Administered 2015-12-03: 25 mg via ORAL
  Filled 2015-12-03: qty 1

## 2015-12-03 NOTE — Anesthesia Preprocedure Evaluation (Signed)
Anesthesia Evaluation  Patient identified by MRN, date of birth, ID band Patient awake    Reviewed: Allergy & Precautions, NPO status , Patient's Chart, lab work & pertinent test results  Airway Mallampati: II  TM Distance: >3 FB Neck ROM: Full    Dental  (+) Teeth Intact, Dental Advisory Given   Pulmonary former smoker,  breath sounds clear to auscultation        Cardiovascular hypertension, Rhythm:Regular Rate:Normal     Neuro/Psych    GI/Hepatic   Endo/Other  diabetes  Renal/GU      Musculoskeletal   Abdominal   Peds  Hematology   Anesthesia Other Findings   Reproductive/Obstetrics                             Anesthesia Physical Anesthesia Plan  ASA: III  Anesthesia Plan: General   Post-op Pain Management:    Induction: Intravenous  Airway Management Planned: LMA  Additional Equipment:   Intra-op Plan:   Post-operative Plan:   Informed Consent: I have reviewed the patients History and Physical, chart, labs and discussed the procedure including the risks, benefits and alternatives for the proposed anesthesia with the patient or authorized representative who has indicated his/her understanding and acceptance.   Dental advisory given  Plan Discussed with: CRNA and Anesthesiologist  Anesthesia Plan Comments:         Anesthesia Quick Evaluation  

## 2015-12-03 NOTE — Anesthesia Postprocedure Evaluation (Signed)
Anesthesia Post Note  Patient: Michael Guzman  Procedure(s) Performed: Procedure(s) (LRB): MRI LUMBAR SPINE (N/A)  Patient location during evaluation: PACU Anesthesia Type: General Level of consciousness: awake, awake and alert and oriented Pain management: pain level controlled Vital Signs Assessment: post-procedure vital signs reviewed and stable Respiratory status: spontaneous breathing and nonlabored ventilation Anesthetic complications: no    Last Vitals:  Filed Vitals:   12/03/15 1130 12/03/15 1145  BP: 129/90 120/71  Pulse: 67 66  Temp: 36.6 C   Resp: 18     Last Pain:  Filed Vitals:   12/03/15 1147  PainSc: 6                  Keano Guggenheim COKER

## 2015-12-03 NOTE — Progress Notes (Signed)
Patient unsure when he took his beta blocker last. Notified Dr. Linna Caprice he advised to give dose of Metoprolol

## 2015-12-03 NOTE — Transfer of Care (Signed)
Immediate Anesthesia Transfer of Care Note  Patient: Michael Guzman  Procedure(s) Performed: Procedure(s): MRI LUMBAR SPINE (N/A)  Patient Location: PACU  Anesthesia Type:General  Level of Consciousness: awake  Airway & Oxygen Therapy: Patient Spontanous Breathing and Patient connected to nasal cannula oxygen  Post-op Assessment: Report given to RN, Post -op Vital signs reviewed and stable and Patient moving all extremities X 4  Post vital signs: stable  Last Vitals:  Filed Vitals:   12/03/15 0910 12/03/15 1130  BP: 118/77 129/90  Pulse: 75 67  Temp: 37 C 36.6 C  Resp: 20 18    Complications: No apparent anesthesia complications

## 2015-12-04 ENCOUNTER — Ambulatory Visit (INDEPENDENT_AMBULATORY_CARE_PROVIDER_SITE_OTHER): Payer: Medicare Other | Admitting: Internal Medicine

## 2015-12-04 ENCOUNTER — Encounter: Payer: Self-pay | Admitting: Internal Medicine

## 2015-12-04 VITALS — BP 110/82 | HR 72 | Temp 97.0°F | Resp 16 | Ht 69.0 in | Wt 196.0 lb

## 2015-12-04 DIAGNOSIS — J0410 Acute tracheitis without obstruction: Secondary | ICD-10-CM

## 2015-12-04 DIAGNOSIS — I10 Essential (primary) hypertension: Secondary | ICD-10-CM | POA: Diagnosis not present

## 2015-12-04 DIAGNOSIS — J0141 Acute recurrent pansinusitis: Secondary | ICD-10-CM

## 2015-12-04 DIAGNOSIS — E559 Vitamin D deficiency, unspecified: Secondary | ICD-10-CM

## 2015-12-04 DIAGNOSIS — Z79899 Other long term (current) drug therapy: Secondary | ICD-10-CM

## 2015-12-04 DIAGNOSIS — J029 Acute pharyngitis, unspecified: Secondary | ICD-10-CM

## 2015-12-04 DIAGNOSIS — E1121 Type 2 diabetes mellitus with diabetic nephropathy: Secondary | ICD-10-CM

## 2015-12-04 DIAGNOSIS — E782 Mixed hyperlipidemia: Secondary | ICD-10-CM

## 2015-12-04 DIAGNOSIS — E1129 Type 2 diabetes mellitus with other diabetic kidney complication: Secondary | ICD-10-CM | POA: Diagnosis not present

## 2015-12-04 LAB — HEPATIC FUNCTION PANEL
ALBUMIN: 3.9 g/dL (ref 3.6–5.1)
ALT: 17 U/L (ref 9–46)
AST: 15 U/L (ref 10–35)
Alkaline Phosphatase: 56 U/L (ref 40–115)
BILIRUBIN INDIRECT: 0.3 mg/dL (ref 0.2–1.2)
Bilirubin, Direct: 0.1 mg/dL (ref <=0.2)
TOTAL PROTEIN: 6.4 g/dL (ref 6.1–8.1)
Total Bilirubin: 0.4 mg/dL (ref 0.2–1.2)

## 2015-12-04 LAB — CBC WITH DIFFERENTIAL/PLATELET
BASOS ABS: 0.1 10*3/uL (ref 0.0–0.1)
Basophils Relative: 1 % (ref 0–1)
EOS PCT: 6 % — AB (ref 0–5)
Eosinophils Absolute: 0.3 10*3/uL (ref 0.0–0.7)
HCT: 40.1 % (ref 39.0–52.0)
Hemoglobin: 13.4 g/dL (ref 13.0–17.0)
LYMPHS ABS: 1.8 10*3/uL (ref 0.7–4.0)
Lymphocytes Relative: 33 % (ref 12–46)
MCH: 29.1 pg (ref 26.0–34.0)
MCHC: 33.4 g/dL (ref 30.0–36.0)
MCV: 87.2 fL (ref 78.0–100.0)
MONO ABS: 0.6 10*3/uL (ref 0.1–1.0)
MPV: 10.2 fL (ref 8.6–12.4)
Monocytes Relative: 10 % (ref 3–12)
Neutro Abs: 2.8 10*3/uL (ref 1.7–7.7)
Neutrophils Relative %: 50 % (ref 43–77)
Platelets: 205 10*3/uL (ref 150–400)
RBC: 4.6 MIL/uL (ref 4.22–5.81)
RDW: 13.5 % (ref 11.5–15.5)
WBC: 5.5 10*3/uL (ref 4.0–10.5)

## 2015-12-04 LAB — TSH: TSH: 0.26 m[IU]/L — AB (ref 0.40–4.50)

## 2015-12-04 LAB — LIPID PANEL
CHOLESTEROL: 125 mg/dL (ref 125–200)
HDL: 35 mg/dL — ABNORMAL LOW (ref >=40)
LDL Cholesterol: 36 mg/dL (ref <130)
TRIGLYCERIDES: 271 mg/dL — AB (ref <150)
Total CHOL/HDL Ratio: 3.6 Ratio (ref <=5.0)
VLDL: 54 mg/dL — ABNORMAL HIGH (ref <30)

## 2015-12-04 LAB — MAGNESIUM: MAGNESIUM: 1.9 mg/dL (ref 1.5–2.5)

## 2015-12-04 MED ORDER — HYDROCODONE-ACETAMINOPHEN 5-325 MG PO TABS: ORAL_TABLET | ORAL | Status: DC

## 2015-12-04 MED ORDER — PREDNISONE 20 MG PO TABS: ORAL_TABLET | ORAL | Status: DC

## 2015-12-04 MED ORDER — AZITHROMYCIN 250 MG PO TABS: ORAL_TABLET | ORAL | Status: DC

## 2015-12-04 NOTE — Progress Notes (Signed)
Patient ID: Michael Guzman, male   DOB: [birth year], 70 y.o.   MRN: [id number]   This very nice 70 y.o. MBM presents for  follow up with Hypertension, ASCADHyperlipidemia,T2_NIDDM and Vitamin D Deficiency. Patient also presents with c/o recurrent sinus pressure & congedstiondry non productive cough.    Patient is treated for HTN since 1992 and in 2013 had 3 stents placed for ACS and has done well since.  & BP has been controlled and today's BP: 110/82 mmHg. Patient has had no complaints of any cardiac type chest pain, palpitations, dyspnea/orthopnea/PND, dizziness, claudication, or dependent edema.   Hyperlipidemia is controlled with diet & meds. Patient denies myalgias or other med SE's. Last Lipids were at goal with Cholesterol 110*; HDL 38*; LDL 42; but elevated Triglycerides 364 on 09/05/2015.   Also, the patient has history of T2_NIDDM w/CKD 2 since 1998  and is not compliant with diet and has had no symptoms of reactive hypoglycemia, diabetic polys, paresthesias or visual blurring.  Last A1c was not at goal with A1c 7.5% on 11/28/2015.   Further, the patient also has history of Vitamin D Deficiency of "15" in 2008  and supplements vitamin D without any suspected side-effects. Last vitamin D was 75 on 09/05/2015.   Medication Sig  . Artificial Tear Ointment Place 1 drop into both eyes daily as needed (for dry eyes).  Marland Kitchen aspirin 81 MG tablet Take 81 mg by mouth daily.  Marland Kitchen atorvastatin  40 MG tablet Take 0.5 tablets (20 mg total) by mouth daily.  . ASTELIN nasal spray Place 1 spray into both nostrils 2 (two) times daily. Use in each nostril as directed  . Cetirizine 10 MG tablet Take 10 mg by mouth daily.  Marland Kitchen VITAMIN D3 5000 UNITS CAPS Take 5,000 Units by mouth 2 (two) times daily.  . Cranberry 500 MG CAPS Take 500 mg by mouth daily.  . enalapril10 MG tablet Take 1 tablet (10 mg total) by mouth daily. (Patient taking differently: Take 5 mg by mouth daily. )  . gabapentin  600 MG tablet Take 1  tablet 3 x day as needed for pain (Patient taking differently: Take 600 mg by mouth 3 (three) times daily. )  . glipiZIDE (GLUCOTROL) 5 MG tablet TAKE 1 BY MOUTH 3 TIMES DAILY FOR GLUCOSE OVER 200   . ANUSOL-HC suppository Place 1 suppository (25 mg total) rectally 2 (two) times daily.   . INVOKANA 300 MG TABS tablet TAKE 1/2 TO 1 BY MOUTH DAILY AS DIRECTED FOR DIABETES   . Magnesium 250 MG TABS Take 250 mg by mouth daily.  . metFORMIN-XR 500 MG Patient taking differently: TAKE 1 Tablet  BY MOUTH 3 TIMES DAILY AS DIRECTED FOR DIABETES)  . methocarbamol  500 MG tablet Take 500 mg by mouth daily as needed for muscle spasms.   . metoprolol succinate-XL) 50 MG  Patient taking differently: Take 25 mg by mouth daily.   . montelukast  10 MG tablet Take 1 tablet (10 mg total) by mouth at bedtime.  . Multiple Vitamins-Minerals  Take 1 tablet by mouth daily.  Marland Kitchen NITROSTAT 0.4 MG SL tablet Place 1 tablet (0.4 mg total) under the tongue every 5 (five) minutes as needed for chest pain.  . Omega-3 FISH OIL 1000 MG CAPS Take 1,000 mg by mouth daily.   . pantoprazole  40 MG  TAKE 1 BY MOUTH DAILY  . silodosin (RAPAFLO) 8 MG Take 8 mg by mouth daily as needed (for BPH). Reported on  11/26/2015     No Known Allergies  PMHx:   Past Medical History  Diagnosis Date  . Diabetes mellitus without complication ([place])   . Hypertension   . GERD (gastroesophageal reflux disease)   . Chronic back pain   . CAD (coronary artery disease)   . Elevated PSA     being monitored  . Arthritis   . Chronically dry eyes    Immunization History  Administered Date(s) Administered  . Influenza Split 06/26/2012  . Influenza, High Dose Seasonal PF 06/20/2015  . Influenza-Unspecified 06/14/2013, 06/25/2014  . Pneumococcal-Unspecified 06/16/2010  . Td 09/17/2003  . Tdap 11/26/2013  . Zoster 07/12/2012   Past Surgical History  Procedure Laterality Date  . Appendectomy    . Cardiac surgery      3 stent placed.  . Left  heart catheterization with coronary angiogram N/A 06/27/2012    Procedure: LEFT HEART CATHETERIZATION WITH CORONARY ANGIOGRAM;  Surgeon: Candee Furbish, MD;  [facility] LAB;  Service: Cardiovascular;  Laterality: N/A;  . Left heart catheterization with coronary angiogram N/A 07/25/2012    Procedure: LEFT HEART CATHETERIZATION WITH CORONARY ANGIOGRAM;  Surgeon: Sueanne Margarita, MD;  Location: [facility] CATH LAB;  Service: Cardiovascular;  Laterality: N/A;  . Colonoscopy    . Radiology with anesthesia N/A 12/03/2015    Procedure: MRI LUMBAR SPINE;  Surgeon: Medication Radiologist, MD;  Location: [facility];  Service: Radiology;  Laterality: N/A;   FHx:    Reviewed / unchanged  SHx:    Reviewed / unchanged  Systems Review:  Constitutional: Denies fever, chills, wt changes, headaches, insomnia, fatigue, night sweats, change in appetite. Eyes: Denies redness, blurred vision, diplopia, discharge, itchy, watery eyes.  ENT: Denies discharge, post nasal drip, epistaxis,earache, hearing loss, dental pain, tinnitus, vertigo, snoring.  CV: Denies chest pain, palpitations, irregular heartbeat, syncope, dyspnea, diaphoresis, orthopnea, PND, claudication or edema. Respiratory: denies  dyspnea, DOE, pleurisy, hoarseness, laryngitis, wheezing.  Gastrointestinal: Denies dysphagia, odynophagia, heartburn, reflux, water brash, abdominal pain or cramps, nausea, vomiting, bloating, diarrhea, constipation, hematemesis, melena, hematochezia  or hemorrhoids. Genitourinary: Denies dysuria, frequency, urgency, nocturia, hesitancy, discharge, hematuria or flank pain. Musculoskeletal: Denies arthralgias, myalgias, stiffness, jt. swelling, pain, limping or strain/sprain.  Skin: Denies pruritus, rash, hives, warts, acne, eczema or change in skin lesion(s). Neuro: No weakness, tremor, incoordination, spasms, paresthesia or pain. Psychiatric: Denies confusion, memory loss or sensory loss. Endo: Denies change in weight, skin or hair  change.  Heme/Lymph: No excessive bleeding, bruising or enlarged lymph nodes.  Physical Exam  BP 110/82 mmHg  Pulse 72  Temp(Src) 97 F (36.1 C)  Resp 16  Ht 5\' 9"  (1.753 m)  Wt 196 lb (88.905 kg)  BMI 28.93 kg/m2  Appears well nourished and in no respiratory distress.  Eyes: PERRLA, EOMs, conjunctiva no swelling or erythema. Sinuses: 2+ bilat  frontal/maxillary tenderness ENT/Mouth: EAC's clear, TM's nl w/o erythema, bulging. Nares clear w/o erythema, swelling, exudates. Oropharynx 2+ injected w/o exudates. Oral hygiene is good. Tongue normal, non obstructing. Hearing intact.  Neck: Supple. Thyroid nl. Car 2+/2+ without bruits, nodes or JVD. Chest: Scattered rales &  Rhonchi w/o wheezing or stridor.  Cor: Heart sounds normal w/ regular rate and rhythm without sig. murmurs, gallops, clicks, or rubs. Peripheral pulses normal and equal  without edema.  Abdomen: Soft & bowel sounds normal. Non-tender w/o guarding, rebound, hernias, masses, or organomegaly.  Lymphatics: Unremarkable.  Musculoskeletal: Full ROM all peripheral extremities, joint stability, 5/5 strength, and normal gait.  Skin: Warm, dry  without exposed rashes, lesions or ecchymosis apparent.  Neuro: Cranial nerves intact, reflexes equal bilaterally. Sensory-motor testing grossly intact. Tendon reflexes grossly intact.  Pysch: Alert & oriented x 3.  Insight and judgement nl & appropriate. No ideations.  Assessment and Plan:  1. Essential hypertension  - TSH  2. Mixed hyperlipidemia  - Lipid panel - TSH  3. Type 2 diabetes mellitus with diabetic nephropathy, without long-term current use of insulin (HCC)  - Insulin, random  4. Vitamin D deficiency  - VITAMIN D 25 Hydroxy  5. Acute recurrent pansinusitis  - predniSONE (DELTASONE) 20 MG tablet; 1 tab 3 x day for 3 days, then 1 tab 2 x day for 3 days, then 1 tab 1 x day for 5 days  Dispense: 20 tablet; Refill: 0 - azithromycin (ZITHROMAX) 250 MG tablet; Take 2  tablets (500 mg) on  Day 1,  followed by 1 tablet (250 mg) once daily on Days 2 through 5.  Dispense: 6 each; Refill: 1  6. Acute pharyngitis, unspecified etiology  - azithromycin (ZITHROMAX) 250 MG tablet; Take 2 tablets (500 mg) on  Day 1,  followed by 1 tablet (250 mg) once daily on Days 2 through 5.  Dispense: 6 each; Refill: 1  7. Tracheitis  - HYDROcodone-acetaminophen (NORCO) 5-325 MG tablet; Take 1/2 to 1 tablet 3 to 4 hours if needed for cough  Dispense: 50 tablet; Refill: 0 - predniSONE (DELTASONE) 20 MG tablet; 1 tab 3 x day for 3 days, then 1 tab 2 x day for 3 days, then 1 tab 1 x day for 5 days  Dispense: 20 tablet; Refill: 0 - azithromycin (ZITHROMAX) 250 MG tablet; Take 2 tablets (500 mg) on  Day 1,  followed by 1 tablet (250 mg) once daily on Days 2 through 5.  Dispense: 6 each; Refill: 1  8. Medication management  - CBC with Differential/Platelet - Hepatic function panel - Magnesium   Recommended regular exercise, BP monitoring, weight control, and discussed med and SE's. Recommended labs to assess and monitor clinical status. Further disposition pending results of labs. Over 30 minutes of exam, counseling, chart review was performed

## 2015-12-04 NOTE — Patient Instructions (Signed)

## 2015-12-05 ENCOUNTER — Ambulatory Visit: Payer: Self-pay | Admitting: Internal Medicine

## 2015-12-05 LAB — INSULIN, RANDOM: Insulin: 40.8 u[IU]/mL — ABNORMAL HIGH (ref 2.0–19.6)

## 2015-12-05 LAB — VITAMIN D 25 HYDROXY (VIT D DEFICIENCY, FRACTURES): VIT D 25 HYDROXY: 89 ng/mL (ref 30–100)

## 2015-12-05 MED FILL — Lidocaine HCl IV Inj 20 MG/ML: INTRAVENOUS | Qty: 5 | Status: AC

## 2015-12-05 MED FILL — Propofol IV Emul 200 MG/20ML (10 MG/ML): INTRAVENOUS | Qty: 10 | Status: AC

## 2015-12-05 MED FILL — Phenylephrine-NaCl Pref Syr 0.4 MG/10ML-0.9% (40 MCG/ML): INTRAVENOUS | Qty: 10 | Status: AC

## 2015-12-05 MED FILL — Lactated Ringer's Solution: INTRAVENOUS | Qty: 1000 | Status: AC

## 2015-12-05 MED FILL — Ondansetron HCl Inj 4 MG/2ML (2 MG/ML): INTRAMUSCULAR | Qty: 2 | Status: AC

## 2015-12-06 DIAGNOSIS — M4696 Unspecified inflammatory spondylopathy, lumbar region: Secondary | ICD-10-CM | POA: Diagnosis not present

## 2015-12-06 DIAGNOSIS — M5416 Radiculopathy, lumbar region: Secondary | ICD-10-CM | POA: Diagnosis not present

## 2015-12-16 DIAGNOSIS — M5416 Radiculopathy, lumbar region: Secondary | ICD-10-CM | POA: Diagnosis not present

## 2015-12-27 ENCOUNTER — Other Ambulatory Visit: Payer: Self-pay | Admitting: Physician Assistant

## 2015-12-27 ENCOUNTER — Other Ambulatory Visit: Payer: Self-pay | Admitting: Internal Medicine

## 2015-12-27 DIAGNOSIS — E119 Type 2 diabetes mellitus without complications: Secondary | ICD-10-CM

## 2015-12-27 MED ORDER — METFORMIN HCL ER 500 MG PO TB24: ORAL_TABLET | ORAL | Status: DC

## 2016-01-02 ENCOUNTER — Encounter: Payer: Self-pay | Admitting: Internal Medicine

## 2016-01-02 ENCOUNTER — Other Ambulatory Visit: Payer: Self-pay | Admitting: Internal Medicine

## 2016-01-02 DIAGNOSIS — M5416 Radiculopathy, lumbar region: Secondary | ICD-10-CM | POA: Diagnosis not present

## 2016-01-02 DIAGNOSIS — E119 Type 2 diabetes mellitus without complications: Secondary | ICD-10-CM

## 2016-01-02 MED ORDER — METFORMIN HCL ER 500 MG PO TB24
ORAL_TABLET | ORAL | Status: DC
Start: 2016-01-02 — End: 2016-06-18

## 2016-01-09 ENCOUNTER — Encounter: Payer: Self-pay | Admitting: Internal Medicine

## 2016-01-09 ENCOUNTER — Other Ambulatory Visit: Payer: Self-pay | Admitting: Internal Medicine

## 2016-01-09 DIAGNOSIS — J3089 Other allergic rhinitis: Secondary | ICD-10-CM

## 2016-01-09 MED ORDER — MONTELUKAST SODIUM 10 MG PO TABS
10.0000 mg | ORAL_TABLET | Freq: Every day | ORAL | Status: DC
Start: 2016-01-09 — End: 2016-11-28

## 2016-01-16 ENCOUNTER — Ambulatory Visit (INDEPENDENT_AMBULATORY_CARE_PROVIDER_SITE_OTHER): Payer: Medicare Other | Admitting: Internal Medicine

## 2016-01-16 ENCOUNTER — Encounter (HOSPITAL_COMMUNITY): Payer: Self-pay

## 2016-01-16 ENCOUNTER — Emergency Department (HOSPITAL_COMMUNITY)
Admission: EM | Admit: 2016-01-16 | Discharge: 2016-01-17 | Disposition: A | Discharge status: Home / Self Care | Payer: Medicare Other | Attending: Emergency Medicine | Admitting: Emergency Medicine

## 2016-01-16 ENCOUNTER — Encounter: Payer: Self-pay | Admitting: Internal Medicine

## 2016-01-16 ENCOUNTER — Emergency Department (HOSPITAL_COMMUNITY): Payer: Medicare Other

## 2016-01-16 VITALS — BP 108/72 | HR 96 | Temp 97.3°F | Resp 16 | Ht 69.0 in | Wt 190.4 lb

## 2016-01-16 DIAGNOSIS — R1032 Left lower quadrant pain: Secondary | ICD-10-CM

## 2016-01-16 DIAGNOSIS — I10 Essential (primary) hypertension: Secondary | ICD-10-CM | POA: Diagnosis not present

## 2016-01-16 DIAGNOSIS — M1990 Unspecified osteoarthritis, unspecified site: Secondary | ICD-10-CM | POA: Insufficient documentation

## 2016-01-16 DIAGNOSIS — Z7984 Long term (current) use of oral hypoglycemic drugs: Secondary | ICD-10-CM | POA: Diagnosis not present

## 2016-01-16 DIAGNOSIS — I2510 Atherosclerotic heart disease of native coronary artery without angina pectoris: Secondary | ICD-10-CM | POA: Diagnosis not present

## 2016-01-16 DIAGNOSIS — G8929 Other chronic pain: Secondary | ICD-10-CM | POA: Insufficient documentation

## 2016-01-16 DIAGNOSIS — E119 Type 2 diabetes mellitus without complications: Secondary | ICD-10-CM | POA: Insufficient documentation

## 2016-01-16 DIAGNOSIS — Z87891 Personal history of nicotine dependence: Secondary | ICD-10-CM | POA: Insufficient documentation

## 2016-01-16 DIAGNOSIS — Z7982 Long term (current) use of aspirin: Secondary | ICD-10-CM | POA: Insufficient documentation

## 2016-01-16 DIAGNOSIS — Z79899 Other long term (current) drug therapy: Secondary | ICD-10-CM | POA: Insufficient documentation

## 2016-01-16 DIAGNOSIS — Z7952 Long term (current) use of systemic steroids: Secondary | ICD-10-CM | POA: Insufficient documentation

## 2016-01-16 DIAGNOSIS — K562 Volvulus: Secondary | ICD-10-CM | POA: Diagnosis not present

## 2016-01-16 DIAGNOSIS — E860 Dehydration: Secondary | ICD-10-CM | POA: Diagnosis not present

## 2016-01-16 DIAGNOSIS — K529 Noninfective gastroenteritis and colitis, unspecified: Secondary | ICD-10-CM | POA: Diagnosis not present

## 2016-01-16 DIAGNOSIS — K219 Gastro-esophageal reflux disease without esophagitis: Secondary | ICD-10-CM | POA: Insufficient documentation

## 2016-01-16 DIAGNOSIS — Z9889 Other specified postprocedural states: Secondary | ICD-10-CM | POA: Diagnosis not present

## 2016-01-16 LAB — CBC
HEMATOCRIT: 44.5 % (ref 39.0–52.0)
HEMOGLOBIN: 14.6 g/dL (ref 13.0–17.0)
MCH: 28.9 pg (ref 26.0–34.0)
MCHC: 32.8 g/dL (ref 30.0–36.0)
MCV: 87.9 fL (ref 78.0–100.0)
Platelets: 244 10*3/uL (ref 150–400)
RBC: 5.06 MIL/uL (ref 4.22–5.81)
RDW: 13.2 % (ref 11.5–15.5)
WBC: 13.5 10*3/uL — AB (ref 4.0–10.5)

## 2016-01-16 LAB — URINE MICROSCOPIC-ADD ON

## 2016-01-16 LAB — COMPREHENSIVE METABOLIC PANEL
ALBUMIN: 3.8 g/dL (ref 3.5–5.0)
ALK PHOS: 58 U/L (ref 38–126)
ALT: 28 U/L (ref 17–63)
ANION GAP: 13 (ref 5–15)
AST: 21 U/L (ref 15–41)
BILIRUBIN TOTAL: 0.8 mg/dL (ref 0.3–1.2)
BUN: 12 mg/dL (ref 6–20)
CALCIUM: 9.5 mg/dL (ref 8.9–10.3)
CO2: 22 mmol/L (ref 22–32)
Chloride: 104 mmol/L (ref 101–111)
Creatinine, Ser: 1.05 mg/dL (ref 0.61–1.24)
GFR calc Af Amer: >60 mL/min (ref >60)
GFR calc non Af Amer: >60 mL/min (ref >60)
GLUCOSE: 140 mg/dL — AB (ref 65–99)
Potassium: 4.2 mmol/L (ref 3.5–5.1)
Sodium: 139 mmol/L (ref 135–145)
TOTAL PROTEIN: 7.2 g/dL (ref 6.5–8.1)

## 2016-01-16 LAB — URINALYSIS, ROUTINE W REFLEX MICROSCOPIC
Bilirubin Urine: NEGATIVE
Glucose, UA: >1000 mg/dL — AB
Hgb urine dipstick: NEGATIVE
Ketones, ur: 40 mg/dL — AB
LEUKOCYTES UA: NEGATIVE
NITRITE: NEGATIVE
PH: 5.5 (ref 5.0–8.0)
Protein, ur: NEGATIVE mg/dL
SPECIFIC GRAVITY, URINE: 1.038 — AB (ref 1.005–1.030)

## 2016-01-16 LAB — LIPASE, BLOOD: Lipase: 20 U/L (ref 11–51)

## 2016-01-16 MED ORDER — ONDANSETRON 4 MG PO TBDP: 4.0000 mg | ORAL_TABLET | Freq: Three times a day (TID) | ORAL | Status: DC | PRN

## 2016-01-16 MED ORDER — CIPROFLOXACIN HCL 500 MG PO TABS
500.0000 mg | ORAL_TABLET | Freq: Once | ORAL | Status: AC
  Administered 2016-01-16: 500 mg via ORAL
  Filled 2016-01-16: qty 1

## 2016-01-16 MED ORDER — CIPROFLOXACIN HCL 500 MG PO TABS: 500.0000 mg | ORAL_TABLET | Freq: Two times a day (BID) | ORAL | Status: DC

## 2016-01-16 MED ORDER — IOPAMIDOL (ISOVUE-300) INJECTION 61%
INTRAVENOUS | Status: AC
  Administered 2016-01-16: 100 mL
  Filled 2016-01-16: qty 100

## 2016-01-16 MED ORDER — METRONIDAZOLE 500 MG PO TABS: 500.0000 mg | ORAL_TABLET | Freq: Two times a day (BID) | ORAL | Status: DC

## 2016-01-16 MED ORDER — METRONIDAZOLE 500 MG PO TABS
500.0000 mg | ORAL_TABLET | Freq: Once | ORAL | Status: AC
  Administered 2016-01-16: 500 mg via ORAL
  Filled 2016-01-16: qty 1

## 2016-01-16 NOTE — ED Notes (Signed)
Patient left at this time with all belongings. 

## 2016-01-16 NOTE — ED Notes (Signed)
No abdominal pain currently, only with palpation.

## 2016-01-16 NOTE — ED Notes (Signed)
Pt here with LLQ abdominal pain and diarrhea onset yesterday. He went to PCP today and was sent here to be evaluated for diverticulitis.

## 2016-01-16 NOTE — Progress Notes (Signed)
Subjective:    Patient ID: Michael Guzman, male    DOB: 09-03-1946, 70 y.o.   MRN: [id number]  HPI This very nice 70 yo MBM with multiple medical co-morbidities presents with 24+ hr hx/o Abd pain in mid abdomen & LLQ, ? Fever, chills, diaphoresis. Rates pain during nite as 8/10 & now 5/10.  Has anorexic, some nausea and no emesis. Poor po intake in last 24 hrs.  Medication Sig  . Artificial Tear Ointment (DRY EYES OP) Place 1 drop into both eyes daily as needed (for dry eyes).  Marland Kitchen aspirin 81 MG tablet Take 81 mg by mouth daily.  Marland Kitchen atorvastatin (LIPITOR) 40 MG tablet Take 0.5 tablets (20 mg total) by mouth daily.  Marland Kitchen azelastine (ASTELIN) 0.1 % nasal spray Place 1 spray into both nostrils 2 (two) times daily. Use in each nostril as directed  . cetirizine (ZYRTEC) 10 MG tablet Take 10 mg by mouth daily.  . Cholecalciferol (VITAMIN D3) 5000 UNITS CAPS Take 5,000 Units by mouth 2 (two) times daily.  . Cranberry 500 MG CAPS Take 500 mg by mouth daily.  . enalapril (VASOTEC) 10 MG tablet Take 1 tablet (10 mg total) by mouth daily. (Patient taking differently: Take 5 mg by mouth daily. )  . glipiZIDE (GLUCOTROL) 5 MG tablet TAKE 1 BY MOUTH 3 TIMES DAILY FOR GLUCOSE OVER 200 OR AS DIRECTED BY YOUR DOCTOR  . hydrocortisone (ANUSOL-HC) 25 MG suppository Place 1 suppository (25 mg total) rectally 2 (two) times daily. (Patient taking differently: Place 25 mg rectally daily as needed for hemorrhoids. )  . INVOKANA 300 MG TABS tablet TAKE 1/2 TO 1 BY MOUTH DAILY AS DIRECTED FOR DIABETES (Patient taking differently: TAKE  150 mg BY MOUTH DAILY AS DIRECTED FOR DIABETES)  . Magnesium 250 MG TABS Take 250 mg by mouth daily.  . metFORMIN (GLUCOPHAGE-XR) 500 MG 24 hr tablet Take 1 to 2 tablets 2 x day or as directed for Diabetes  . methocarbamol (ROBAXIN) 500 MG tablet Take 500 mg by mouth daily as needed for muscle spasms.   . metoprolol succinate (TOPROL-XL) 50 MG 24 hr tablet Take 1 tablet (50 mg total) by mouth  daily. Take with or immediately following a meal. (Patient taking differently: Take 25 mg by mouth daily. Take with or immediately following a meal.)  . montelukast (SINGULAIR) 10 MG tablet Take 1 tablet (10 mg total) by mouth at bedtime.  . Multiple Vitamins-Minerals (MULTIVITAMIN ADULT PO) Take 1 tablet by mouth daily.  . nitroGLYCERIN (NITROSTAT) 0.4 MG SL tablet Place 1 tablet (0.4 mg total) under the tongue every 5 (five) minutes as needed for chest pain.  . Omega-3 Fatty Acids (FISH OIL) 1000 MG CAPS Take 1,000 mg by mouth daily.   . pantoprazole (PROTONIX) 40 MG tablet TAKE 1 BY MOUTH DAILY  . silodosin (RAPAFLO) 8 MG CAPS capsule Take 8 mg by mouth daily as needed (for BPH). Reported on 11/26/2015  . gabapentin (NEURONTIN) 600 MG tablet Take 1 tablet 3 x day as needed for pain (Patient taking differently: Take 600 mg by mouth 3 (three) times daily. )   No Known Allergies   Past Medical History  Diagnosis Date  . Diabetes mellitus without complication ([place])   . Hypertension   . GERD (gastroesophageal reflux disease)   . Chronic back pain   . CAD (coronary artery disease)   . Elevated PSA     being monitored  . Arthritis   . Chronically dry eyes  Past Surgical History  Procedure Laterality Date  . Appendectomy    . Cardiac surgery      3 stent placed.  . Left heart catheterization with coronary angiogram N/A 06/27/2012    Procedure: LEFT HEART CATHETERIZATION WITH CORONARY ANGIOGRAM;  Surgeon: Candee Furbish, MD;  Location: [facility] CATH LAB;  Service: Cardiovascular;  Laterality: N/A;  . Left heart catheterization with coronary angiogram N/A 07/25/2012    Procedure: LEFT HEART CATHETERIZATION WITH CORONARY ANGIOGRAM;  Surgeon: Sueanne Margarita, MD;  Location: [facility] CATH LAB;  Service: Cardiovascular;  Laterality: N/A;  . Colonoscopy    . Radiology with anesthesia N/A 12/03/2015    Procedure: MRI LUMBAR SPINE;  Surgeon: Medication Radiologist, MD;  Location: [place];  Service: Radiology;   Laterality: N/A;   Review of Systems (+) as above     Objective:   Physical Exam  BP 108/72 mmHg  Pulse 96  Temp(Src) 97.3 F (36.3 C)  Resp 16  Ht 5\' 9"  (1.753 m)  Wt 190 lb 6.4 oz (86.365 kg)  BMI 28.10 kg/m2  HEENT - Eac's patent. TM's Nl. EOM's full. PERRLA. NasoOroPharynx dry. . Neck - supple. Nl Thyroid. Carotids 2+ & No bruits, nodes, JVD Chest - Clear equal BS w/o Rales, rhonchi, wheezes. Cor - Nl HS. RRR w/o sig MGR. PP 1(+). No edema. Abd - sl distended  & tender in mid abdomen & LLQ with (+) G & RB. MS- FROM w/o deformities. Muscle power, tone and bulk Nl. Gait Nl. Neuro - No obvious Cr N abnormalities. Sensory, motor and Cerebellar functions appear Nl w/o focal abnormalities.    Assessment & Plan:   1. Abdominal pain, left lower quadrant - suspect Diverticulitis , r/o perforation   2. Dehydration  - Weight is down 6# in the last 4-6 weeks  - suspected  in setting of a diabetic with pre-existing renal insufficiency.   - Patient referred to ER To evaluate to r/o need for IVF hydration and diagnostic imaging.

## 2016-01-16 NOTE — Discharge Instructions (Signed)
As discussed, today's evaluation is demonstrated enteritis, or inflammation of the small intestine.  This is likely causing your abdominal pain.  It is important to take all medication as directed, and be sure to follow-up with your physician.  Some of your medication may cause you to feel lightheaded.  Please monitor your blood sugar carefully during this illness.   Return here for concerning changes in your condition.

## 2016-01-16 NOTE — ED Provider Notes (Signed)
CSN: [id number]     Arrival date & time 01/16/16  1640 History   First MD Initiated Contact with Patient 01/16/16 2041     Chief Complaint  Patient presents with  . Abdominal Pain     HPI  Patient presents with concern of left lower quadrant abdominal pain, diarrhea. Symptoms began yesterday. Since onset pain is actually improved somewhat, though the patient remains nauseous, anorexic. Pain is focally in the left lower quadrant, nonradiating, sore, moderate/severe. Anus similar to prior episodes of pain. Patient is unsure of prior medical issues, but she comes with a note from his 79 office stating that he has a history of diverticulitis. Patient does acknowledge a history of diabetes, hypertension. Since onset, no clear alleviating or exacerbating factors. No fever, chest pain, other complaints. Past Medical History  Diagnosis Date  . Diabetes mellitus without complication ([place])   . Hypertension   . GERD (gastroesophageal reflux disease)   . Chronic back pain   . CAD (coronary artery disease)   . Elevated PSA     being monitored  . Arthritis   . Chronically dry eyes    Past Surgical History  Procedure Laterality Date  . Appendectomy    . Cardiac surgery      3 stent placed.  . Left heart catheterization with coronary angiogram N/A 06/27/2012    Procedure: LEFT HEART CATHETERIZATION WITH CORONARY ANGIOGRAM;  Surgeon: Candee Furbish, MD;  Location: [facility] CATH LAB;  Service: Cardiovascular;  Laterality: N/A;  . Left heart catheterization with coronary angiogram N/A 07/25/2012    Procedure: LEFT HEART CATHETERIZATION WITH CORONARY ANGIOGRAM;  Surgeon: Sueanne Margarita, MD;  Location: [facility] CATH LAB;  Service: Cardiovascular;  Laterality: N/A;  . Colonoscopy    . Radiology with anesthesia N/A 12/03/2015    Procedure: MRI LUMBAR SPINE;  Surgeon: Medication Radiologist, MD;  Location: [facility];  Service: Radiology;  Laterality: N/A;   Family History  Problem Relation Age of Onset  .  Diabetes Sister    Social History  Substance Use Topics  . Smoking status: Former Smoker -- 58 years    Quit date: 07/27/2011  . Smokeless tobacco: Never Used  . Alcohol Use: Yes     Comment: occasional    Review of Systems  Constitutional:       Per HPI, otherwise negative  HENT:       Per HPI, otherwise negative  Respiratory:       Per HPI, otherwise negative  Cardiovascular:       Per HPI, otherwise negative  Gastrointestinal: Positive for nausea, abdominal pain and diarrhea. Negative for vomiting.  Endocrine:       Negative aside from HPI  Genitourinary:       Neg aside from HPI   Musculoskeletal:       Per HPI, otherwise negative  Skin: Negative.   Neurological: Negative for syncope.      Allergies  Review of patient's allergies indicates no known allergies.  Home Medications   Prior to Admission medications   Medication Sig Start Date End Date Taking? Authorizing Provider  atorvastatin (LIPITOR) 40 MG tablet Take 0.5 tablets (20 mg total) by mouth daily. 11/22/15  Yes Jerline Pain, MD  Artificial Tear Ointment (DRY EYES OP) Place 1 drop into both eyes daily as needed (for dry eyes).    Historical Provider, MD  aspirin 81 MG tablet Take 81 mg by mouth daily.    Historical Provider, MD  azelastine (ASTELIN) 0.1 % nasal  spray Place 1 spray into both nostrils 2 (two) times daily. Use in each nostril as directed    Historical Provider, MD  cetirizine (ZYRTEC) 10 MG tablet Take 10 mg by mouth daily.    Historical Provider, MD  Cholecalciferol (VITAMIN D3) 5000 UNITS CAPS Take 5,000 Units by mouth 2 (two) times daily.    Historical Provider, MD  Cranberry 500 MG CAPS Take 500 mg by mouth daily.    Historical Provider, MD  enalapril (VASOTEC) 10 MG tablet Take 1 tablet (10 mg total) by mouth daily. Patient taking differently: Take 5 mg by mouth daily.  05/07/15   Jerline Pain, MD  gabapentin (NEURONTIN) 600 MG tablet Take 1 tablet 3 x day as needed for pain Patient  taking differently: Take 600 mg by mouth 3 (three) times daily.  10/17/15 01/15/16  Courtney Forcucci, PA-C  glipiZIDE (GLUCOTROL) 5 MG tablet TAKE 1 BY MOUTH 3 TIMES DAILY FOR GLUCOSE OVER 200 OR AS DIRECTED BY YOUR DOCTOR 11/26/15   [name], MD  hydrocortisone (ANUSOL-HC) 25 MG suppository Place 1 suppository (25 mg total) rectally 2 (two) times daily. Patient taking differently: Place 25 mg rectally daily as needed for hemorrhoids.  05/09/15   Vicie Mutters, PA-C  INVOKANA 300 MG TABS tablet TAKE 1/2 TO 1 BY MOUTH DAILY AS DIRECTED FOR DIABETES Patient taking differently: TAKE  150 mg BY MOUTH DAILY AS DIRECTED FOR DIABETES 09/18/15   Starlyn Skeans, PA-C  Magnesium 250 MG TABS Take 250 mg by mouth daily.    Historical Provider, MD  metFORMIN (GLUCOPHAGE-XR) 500 MG 24 hr tablet Take 1 to 2 tablets 2 x day or as directed for Diabetes 01/02/16 07/03/16  [name], MD  methocarbamol (ROBAXIN) 500 MG tablet Take 500 mg by mouth daily as needed for muscle spasms.     Historical Provider, MD  metoprolol succinate (TOPROL-XL) 50 MG 24 hr tablet Take 1 tablet (50 mg total) by mouth daily. Take with or immediately following a meal. Patient taking differently: Take 25 mg by mouth daily. Take with or immediately following a meal. 05/07/15   Jerline Pain, MD  montelukast (SINGULAIR) 10 MG tablet Take 1 tablet (10 mg total) by mouth at bedtime. 01/09/16   [name], MD  Multiple Vitamins-Minerals (MULTIVITAMIN ADULT PO) Take 1 tablet by mouth daily.    Historical Provider, MD  nitroGLYCERIN (NITROSTAT) 0.4 MG SL tablet Place 1 tablet (0.4 mg total) under the tongue every 5 (five) minutes as needed for chest pain. 11/27/15   Vicie Mutters, PA-C  Omega-3 Fatty Acids (FISH OIL) 1000 MG CAPS Take 1,000 mg by mouth daily.     Historical Provider, MD  pantoprazole (PROTONIX) 40 MG tablet TAKE 1 BY MOUTH DAILY 10/18/15   [name], MD  silodosin (RAPAFLO) 8 MG CAPS capsule Take 8 mg by mouth daily  as needed (for BPH). Reported on 11/26/2015    Historical Provider, MD   BP 127/74 mmHg  Pulse 74  Temp(Src) 98.3 F (36.8 C) (Oral)  Resp 16  SpO2 100% Physical Exam  Constitutional: He is oriented to person, place, and time. He appears well-developed. No distress.  HENT:  Head: Normocephalic and atraumatic.  Eyes: Conjunctivae and EOM are normal.  Cardiovascular: Normal rate and regular rhythm.   Pulmonary/Chest: Effort normal. No stridor. No respiratory distress.  Abdominal: He exhibits no distension.    Musculoskeletal: He exhibits no edema.  Neurological: He is alert and oriented to person, place, and time.  Skin:  Skin is warm and dry.  Psychiatric: He has a normal mood and affect.  Nursing note and vitals reviewed.   ED Course  Procedures (including critical care time) Labs Review Labs Reviewed  COMPREHENSIVE METABOLIC PANEL - Abnormal; Notable for the following:    Glucose, Bld 140 (*)    All other components within normal limits  CBC - Abnormal; Notable for the following:    WBC 13.5 (*)    All other components within normal limits  URINALYSIS, ROUTINE W REFLEX MICROSCOPIC (NOT AT [facility]) - Abnormal; Notable for the following:    Specific Gravity, Urine 1.038 (*)    Glucose, UA >1000 (*)    Ketones, ur 40 (*)    All other components within normal limits  URINE MICROSCOPIC-ADD ON - Abnormal; Notable for the following:    Squamous Epithelial / LPF 0-5 (*)    Bacteria, UA RARE (*)    All other components within normal limits  LIPASE, BLOOD    Imaging Review Ct Abdomen Pelvis W Contrast  01/16/2016  CLINICAL DATA:  Left lower quadrant pain for 24 hours. Patient went to primary care physician today and was referred to the ED. Previous appendectomy. Diarrhea, nausea, and poor oral intake. EXAM: CT ABDOMEN AND PELVIS WITH CONTRAST TECHNIQUE: Multidetector CT imaging of the abdomen and pelvis was performed using the standard protocol following bolus administration of  intravenous contrast. CONTRAST:  136mL ISOVUE-300 IOPAMIDOL (ISOVUE-300) INJECTION 61% COMPARISON:  05/13/2014 FINDINGS: Atelectasis in the lung bases, likely dependent. Coronary artery calcifications and coronary artery stents. The liver, spleen, gallbladder, pancreas, adrenal glands, abdominal aorta, inferior vena cava, and retroperitoneal lymph nodes are unremarkable. Small cysts in the kidneys. No solid mass or hydronephrosis. Renal nephrograms are symmetrical. Stomach, small bowel, and colon are not abnormally distended small diverticulum at the second portion of the duodenum. Stool fills the colon. Distal mid abdominal small bowel loops demonstrate mild wall thickening and are fluid filled. This may indicate enteritis. No free air or free fluid in the abdomen. Pelvis: Appendix is surgically absent. Bladder wall is not thickened. Prostate gland is not enlarged. No free or loculated pelvic fluid collections. No pelvic mass or lymphadenopathy. No destructive bone lesions. IMPRESSION: Wall thickening of fluid-filled lower abdominal small bowel loops suggest enteritis. No evidence of diverticulitis. No evidence of bowel obstruction. Electronically Signed   By: Lucienne Capers M.D.   On: 01/16/2016 22:48   I have personally reviewed and evaluated these images and lab results as part of my medical decision-making.  Initial labs notable for leukocytosis.  Patient is afebrile.   11:43 PM Patient in no distress. I discussed all findings with the patient and his wife. Patient will follow-up with primary care.  MDM  With history of prior diverticulitis presents with new left lower quadrant abdominal pain, diarrhea, and on exam has tenderness to palpation, with guarding in that region. Patient is afebrile, otherwise well-appearing. However, the patient has CT evidence consistent with enteritis, labs notable for leukocytosis. With some concern for infectious enteritis, patient started antibiotics. Patient  will follow up with primary care.   Carmin Muskrat, MD 01/16/16 [phone]

## 2016-01-20 ENCOUNTER — Encounter: Payer: Self-pay | Admitting: Internal Medicine

## 2016-01-21 DIAGNOSIS — M5416 Radiculopathy, lumbar region: Secondary | ICD-10-CM | POA: Diagnosis not present

## 2016-01-23 ENCOUNTER — Telehealth: Payer: Self-pay | Admitting: *Deleted

## 2016-01-23 NOTE — Telephone Encounter (Signed)
Patient called and states his stomach pain is gone, but he continues to have diarrhea.  Per Dr Melford Aase, he can taking a probiotic 2 times a day, eat Activia yogurt and continue to take Imodium 2 tablets 2-3 times a day at least.  Patient is aware.

## 2016-01-24 ENCOUNTER — Telehealth: Payer: Self-pay | Admitting: *Deleted

## 2016-01-24 ENCOUNTER — Other Ambulatory Visit: Payer: Self-pay | Admitting: *Deleted

## 2016-01-24 MED ORDER — PANTOPRAZOLE SODIUM 40 MG PO TBEC: DELAYED_RELEASE_TABLET | ORAL | Status: DC

## 2016-01-24 NOTE — Telephone Encounter (Signed)
Patient called to request refills of metoprolol and enalapril. He stated that at his last office visit Dr Marlou Porch told him to reduce the metoprolol to 25mg  qd and the enalapril to 5mg  qd. I dont see any documentation in regards to this on that office note, but the rx's on the snapshot that were sent in last year indicate that the patient is taking those doses. Patient requested that if this is correct, the rx to be sent in for the correct dose so that he does not have to cut them in half. Please advise. Thanks, MI

## 2016-01-24 NOTE — Telephone Encounter (Signed)
I can not find any documentation RE: a decrease of these two medications by Dr Marlou Porch.  Will forward to him to review and will call pt back once reviewed.

## 2016-01-27 MED ORDER — METOPROLOL SUCCINATE ER 25 MG PO TB24: 25.0000 mg | ORAL_TABLET | Freq: Every day | ORAL | Status: DC

## 2016-01-27 MED ORDER — ENALAPRIL MALEATE 5 MG PO TABS: 5.0000 mg | ORAL_TABLET | Freq: Every day | ORAL | Status: DC

## 2016-01-27 NOTE — Telephone Encounter (Signed)
I am fine for him to be on the Toprol 25 mg once a day and enalapril 5 mg once a day. I did review his last primary physician note and this was the current dose. He had had hypotension previously.  Candee Furbish, MD

## 2016-01-27 NOTE — Telephone Encounter (Signed)
Rx sent into pharmacy as requested.  

## 2016-01-28 ENCOUNTER — Other Ambulatory Visit: Payer: Self-pay

## 2016-01-28 DIAGNOSIS — R1030 Lower abdominal pain, unspecified: Secondary | ICD-10-CM | POA: Diagnosis not present

## 2016-01-28 DIAGNOSIS — R194 Change in bowel habit: Secondary | ICD-10-CM | POA: Diagnosis not present

## 2016-01-28 MED ORDER — ENALAPRIL MALEATE 5 MG PO TABS: 5.0000 mg | ORAL_TABLET | Freq: Every day | ORAL | Status: DC

## 2016-01-28 MED ORDER — METOPROLOL SUCCINATE ER 25 MG PO TB24: 25.0000 mg | ORAL_TABLET | Freq: Every day | ORAL | Status: DC

## 2016-01-28 NOTE — Telephone Encounter (Signed)
Sent to wrong pharmacy    Call Documentation      Shellia Cleverly, RN at 01/27/2016 1:40 PM     Status: Signed       Expand All Collapse All   Rx sent into pharmacy as requested.            Jerline Pain, MD at 01/27/2016 11:03 AM     Status: Signed       Expand All Collapse All   I am fine for him to be on the Toprol 25 mg once a day and enalapril 5 mg once a day. I did review his last primary physician note and this was the current dose. He had had hypotension previously.  Candee Furbish, MD             Shellia Cleverly, RN at 01/24/2016 10:06 AM     Status: Signed       Expand All Collapse All   I can not find any documentation RE: a decrease of these two medications by Dr Marlou Porch. Will forward to him to review and will call pt back once reviewed.            Juventino Slovak, CMA at 01/24/2016 9:12 AM     Status: Signed       Expand All Collapse All   Patient called to request refills of metoprolol and enalapril. He stated that at his last office visit Dr Marlou Porch told him to reduce the metoprolol to 25mg  qd and the enalapril to 5mg  qd. I dont see any documentation in regards to this on that office note, but the rx's on the snapshot that were sent in last year indicate that the patient is taking those doses. Patient requested that if this is correct, the rx to be sent in for the correct dose so that he does not have to cut them in half. Please advise. Thanks, MI             Approved      Disp Refills Start End    metoprolol succinate (TOPROL-XL) 25 MG 24 hr tablet 90 tablet 2 01/27/2016     Sig - Route:  Take 1 tablet (25 mg total) by mouth daily. Take with or immediately following a meal. - Oral    Class:  Normal    DAW:  No    Authorizing Provider:  Jerline Pain, MD    Ordering User:  Shellia Cleverly, RN    enalapril (VASOTEC) 5 MG tablet 90 tablet 2 01/27/2016     Sig - Route:  Take 1 tablet (5 mg total) by mouth daily.  - Oral    Class:  Normal    DAW:  No    Authorizing Provider:  Jerline Pain, MD    Ordering User:  Shellia Cleverly, RN

## 2016-02-06 ENCOUNTER — Other Ambulatory Visit: Payer: Self-pay | Admitting: *Deleted

## 2016-02-06 DIAGNOSIS — M159 Polyosteoarthritis, unspecified: Secondary | ICD-10-CM

## 2016-02-06 DIAGNOSIS — M150 Primary generalized (osteo)arthritis: Principal | ICD-10-CM

## 2016-02-06 DIAGNOSIS — M8949 Other hypertrophic osteoarthropathy, multiple sites: Secondary | ICD-10-CM

## 2016-02-06 MED ORDER — CANAGLIFLOZIN 300 MG PO TABS: ORAL_TABLET | ORAL | Status: DC

## 2016-02-06 MED ORDER — GABAPENTIN 600 MG PO TABS: ORAL_TABLET | ORAL | Status: DC

## 2016-02-18 DIAGNOSIS — M47816 Spondylosis without myelopathy or radiculopathy, lumbar region: Secondary | ICD-10-CM | POA: Diagnosis not present

## 2016-03-10 ENCOUNTER — Ambulatory Visit (INDEPENDENT_AMBULATORY_CARE_PROVIDER_SITE_OTHER): Payer: Medicare Other | Admitting: Internal Medicine

## 2016-03-10 VITALS — BP 110/80 | HR 76 | Temp 97.4°F | Resp 16 | Ht 69.0 in | Wt 190.8 lb

## 2016-03-10 DIAGNOSIS — Z9861 Coronary angioplasty status: Secondary | ICD-10-CM | POA: Diagnosis not present

## 2016-03-10 DIAGNOSIS — Z79899 Other long term (current) drug therapy: Secondary | ICD-10-CM

## 2016-03-10 DIAGNOSIS — E1122 Type 2 diabetes mellitus with diabetic chronic kidney disease: Secondary | ICD-10-CM | POA: Diagnosis not present

## 2016-03-10 DIAGNOSIS — N182 Chronic kidney disease, stage 2 (mild): Secondary | ICD-10-CM

## 2016-03-10 DIAGNOSIS — I2510 Atherosclerotic heart disease of native coronary artery without angina pectoris: Secondary | ICD-10-CM | POA: Diagnosis not present

## 2016-03-10 DIAGNOSIS — E782 Mixed hyperlipidemia: Secondary | ICD-10-CM | POA: Diagnosis not present

## 2016-03-10 DIAGNOSIS — I10 Essential (primary) hypertension: Secondary | ICD-10-CM

## 2016-03-10 DIAGNOSIS — E559 Vitamin D deficiency, unspecified: Secondary | ICD-10-CM | POA: Diagnosis not present

## 2016-03-10 DIAGNOSIS — R072 Precordial pain: Secondary | ICD-10-CM | POA: Diagnosis not present

## 2016-03-10 DIAGNOSIS — K219 Gastro-esophageal reflux disease without esophagitis: Secondary | ICD-10-CM

## 2016-03-10 LAB — CBC WITH DIFFERENTIAL/PLATELET
BASOS PCT: 1 %
Basophils Absolute: 67 cells/uL (ref 0–200)
EOS PCT: 6 %
Eosinophils Absolute: 402 cells/uL (ref 15–500)
HCT: 41.6 % (ref 38.5–50.0)
HEMOGLOBIN: 13.9 g/dL (ref 13.2–17.1)
LYMPHS ABS: 2613 {cells}/uL (ref 850–3900)
Lymphocytes Relative: 39 %
MCH: 29.2 pg (ref 27.0–33.0)
MCHC: 33.4 g/dL (ref 32.0–36.0)
MCV: 87.4 fL (ref 80.0–100.0)
MONOS PCT: 8 %
MPV: 10.6 fL (ref 7.5–12.5)
Monocytes Absolute: 536 cells/uL (ref 200–950)
NEUTROS ABS: 3082 {cells}/uL (ref 1500–7800)
Neutrophils Relative %: 46 %
PLATELETS: 209 10*3/uL (ref 140–400)
RBC: 4.76 MIL/uL (ref 4.20–5.80)
RDW: 13.4 % (ref 11.0–15.0)
WBC: 6.7 10*3/uL (ref 3.8–10.8)

## 2016-03-10 LAB — MAGNESIUM: Magnesium: 1.9 mg/dL (ref 1.5–2.5)

## 2016-03-10 LAB — BASIC METABOLIC PANEL WITH GFR
BUN: 12 mg/dL (ref 7–25)
CALCIUM: 9.8 mg/dL (ref 8.6–10.3)
CO2: 21 mmol/L (ref 20–31)
Chloride: 101 mmol/L (ref 98–110)
Creat: 1.04 mg/dL (ref 0.70–1.25)
GFR, EST AFRICAN AMERICAN: 85 mL/min (ref >=60)
GFR, EST NON AFRICAN AMERICAN: 73 mL/min (ref >=60)
GLUCOSE: 165 mg/dL — AB (ref 65–99)
Potassium: 4.4 mmol/L (ref 3.5–5.3)
Sodium: 138 mmol/L (ref 135–146)

## 2016-03-10 LAB — HEPATIC FUNCTION PANEL
ALBUMIN: 4.2 g/dL (ref 3.6–5.1)
ALT: 18 U/L (ref 9–46)
AST: 17 U/L (ref 10–35)
Alkaline Phosphatase: 72 U/L (ref 40–115)
BILIRUBIN TOTAL: 0.4 mg/dL (ref 0.2–1.2)
Bilirubin, Direct: 0.1 mg/dL (ref <=0.2)
Indirect Bilirubin: 0.3 mg/dL (ref 0.2–1.2)
TOTAL PROTEIN: 6.5 g/dL (ref 6.1–8.1)

## 2016-03-10 LAB — LIPID PANEL
CHOLESTEROL: 135 mg/dL (ref 125–200)
HDL: 45 mg/dL (ref >=40)
TRIGLYCERIDES: 459 mg/dL — AB (ref <150)
Total CHOL/HDL Ratio: 3 Ratio (ref <=5.0)

## 2016-03-10 LAB — HEMOGLOBIN A1C
Hgb A1c MFr Bld: 8.3 % — ABNORMAL HIGH (ref <5.7)
MEAN PLASMA GLUCOSE: 192 mg/dL

## 2016-03-10 LAB — TSH: TSH: 0.39 mIU/L — ABNORMAL LOW (ref 0.40–4.50)

## 2016-03-10 NOTE — Progress Notes (Signed)
Patient ID: Michael Guzman, male   DOB: 01/20/1946, 70 y.o.   MRN: [id number]  [facility]                       [name], M.D.        Uvaldo Bristle. Silverio Lay, P.A.-C       Starlyn Skeans, P.A.-C   [facility]                [address] [place], [place] [id number] Telephone [phone] Telefax [phone] ______________________________________________________________________     This very nice 70 y.o. MBM presents for 19month follow up with Hypertension, Hyperlipidemia, Pre-Diabetes and Vitamin D Deficiency.      Patient is treated for HTN  Since 1991 & BP has been controlled at home. Today's BP is 110/80.  In 2013 he had PTCA & Stent by Dr Candee Furbish. Patient does c/o DOE and vague exertional chest discomfort suspect for angina after carrying 25# short distances and having to stop and rest after minimal activity.  He has had no complaints of  palpitations, orthopnea/PND, dizziness, claudication, or dependent edema.     Hyperlipidemia is controlled with diet & meds. Patient denies myalgias or other med SE's. Last Lipids were at goal with  Cholesterol 125; HDL 35*; LDL 36; but elevated Triglycerides 271 on 12/04/2015.      Also, the patient has history of T2_NIDDM circa 1998 and  has had no symptoms of reactive hypoglycemia, diabetic polys, paresthesias or visual blurring.  Last A1c was  Not at goal with A1c 7.5% on 11/28/2015.      Further, the patient also has history of Vitamin D Deficiency of "15" in 2008  and supplements vitamin D without any suspected side-effects. Last vitamin D was 89 on 12/04/2015.     Medication Sig  . Artificial Tear Ointment  Place 1 drop into both eyes daily as needed (for dry eyes).  Marland Kitchen aspirin 81 MG  Take 81 mg by mouth daily.  Marland Kitchen atorvastatin  40 MG  Take 0.5 tablets (20 mg total) by mouth daily.  . ASTELIN  nasal spray Place 1 spray into both nostrils 2 (two) times daily.  Use in each nostril as directed  . INVOKANA 300 MG  TAKE 1/2 TO 1 BY MOUTH DAILY AS DIRECTED FOR DIABETES  . cetirizine  10 MG  Take 10 mg by mouth daily.  Marland Kitchen VITAMIN D3 5000 UNITS Take 5,000 Units by mouth 2 (two) times daily.  . Cranberry 500 MG  Take 500 mg by mouth daily.  . enalapril  5 MG  Take 1 tablet (5 mg total) by mouth daily.  Marland Kitchen gabapentin  600 MG  Take 1 tablet 3 x day as needed for pain  . glipiZIDE  5 MG  TAKE 1 BY MOUTH 3 TIMES DAILY FOR GLUCOSE OVER 200   . ANUSOL-HC 25 MG supp Place 1 suppository (25 mg total) rectally 2 (two) times daily if needed  . Magnesium 250 MG  Take 250 mg by mouth daily.  . metFORMIN-XR 500 MG  Take 1 to 2 tablets 2 x day or as directed for Diabetes  . methocarbamol  500 MG  Take 500 mg by mouth daily as needed for muscle spasms.   . metoprolol succ-XL 25 MG  Take 1 tablet (25 mg total) by mouth daily.  Take with or immediately following a meal.  . montelukast  10 MG  Take 1 tablet (10 mg total) by mouth at bedtime.  . MultiVit-Min Take 1 tablet by mouth daily.  Marland Kitchen nNITROSTAT 0.4 MG SL  Place 1 tablet (0.4 mg total) under the tongue every 5 (five) minutes as needed for chest pain.  . Omega-3 FISH OIL 1000 MG  Take 1,000 mg by mouth daily.   . ondansetron ODT 4 MG Take 1 tablet (4 mg total) by mouth every 8 (eight) hours as needed for nausea or vomiting.  . pantoprazole  40 MG tablet TAKE 1 TABLET BY MOUTH DAILY   No Known Allergies  PMHx:   Past Medical History  Diagnosis Date  . Diabetes mellitus without complication ([place])   . Hypertension   . GERD (gastroesophageal reflux disease)   . Chronic back pain   . CAD (coronary artery disease)   . Elevated PSA     being monitored  . Arthritis   . Chronically dry eyes    Immunization History  Administered Date(s) Administered  . Influenza Split 06/26/2012  . Influenza, High Dose Seasonal PF 06/20/2015  . Influenza-Unspecified 06/14/2013, 06/25/2014  . Pneumococcal-Unspecified 06/16/2010  . Td  09/17/2003  . Tdap 11/26/2013  . Zoster 07/12/2012   Past Surgical History  Procedure Laterality Date  . Appendectomy    . Cardiac surgery      3 stent placed.  . Left heart catheterization with coronary angiogram N/A 06/27/2012    Procedure: LEFT HEART CATHETERIZATION WITH CORONARY ANGIOGRAM;  Surgeon: Candee Furbish, MD  . Left heart catheterization with coronary angiogram N/A 07/25/2012    Procedure: LEFT HEART CATHETERIZATION WITH CORONARY ANGIOGRAM;  Surgeon: Sueanne Margarita, MD  . Colonoscopy    . Radiology with anesthesia N/A 12/03/2015    Procedure: MRI LUMBAR SPINE;  Surgeon: Medication Radiologist, MD   FHx:    Reviewed / unchanged  SHx:    Reviewed / unchanged  Systems Review:  Constitutional: Denies fever, chills, wt changes, headaches, insomnia, fatigue, night sweats, change in appetite. Eyes: Denies redness, blurred vision, diplopia, discharge, itchy, watery eyes.  ENT: Denies discharge, congestion, post nasal drip, epistaxis, sore throat, earache, hearing loss, dental pain, tinnitus, vertigo, sinus pain, snoring.  CV: Denies chest pain, palpitations, irregular heartbeat, syncope, dyspnea, diaphoresis, orthopnea, PND, claudication or edema. Respiratory: denies cough, dyspnea, DOE, pleurisy, hoarseness, laryngitis, wheezing.  Gastrointestinal: Denies dysphagia, odynophagia, heartburn, reflux, water brash, abdominal pain or cramps, nausea, vomiting, bloating, diarrhea, constipation, hematemesis, melena, hematochezia  or hemorrhoids. Genitourinary: Denies dysuria, frequency, urgency, nocturia, hesitancy, discharge, hematuria or flank pain. Musculoskeletal: Denies arthralgias, myalgias, stiffness, jt. swelling, pain, limping or strain/sprain.  Skin: Denies pruritus, rash, hives, warts, acne, eczema or change in skin lesion(s). Neuro: No weakness, tremor, incoordination, spasms, paresthesia or pain. Psychiatric: Denies confusion, memory loss or sensory loss. Endo: Denies change  in weight, skin or hair change.  Heme/Lymph: No excessive bleeding, bruising or enlarged lymph nodes.  Physical Exam  BP 110/80 mmHg  Pulse 76  Temp(Src) 97.4 F (36.3 C)  Resp 16  Ht 5\' 9"  (1.753 m)  Wt 190 lb 12.8 oz (86.546 kg)  BMI 28.16 kg/m2  Appears well nourished and in no distress.  Eyes: PERRLA, EOMs, conjunctiva no swelling or erythema. Sinuses: No frontal/maxillary tenderness ENT/Mouth: EAC's clear, TM's nl w/o erythema, bulging. Nares clear w/o erythema, swelling, exudates. Oropharynx clear without erythema or exudates. Oral hygiene is good. Tongue normal, non obstructing. Hearing intact.  Neck:  Supple. Thyroid nl. Car 2+/2+ without bruits, nodes or JVD. Chest: Respirations nl with BS clear & equal w/o rales, rhonchi, wheezing or stridor.  Cor: Heart sounds normal w/ regular rate and rhythm without sig. murmurs, gallops, clicks, or rubs. Peripheral pulses normal and equal  without edema.  Abdomen: Soft & bowel sounds normal. Non-tender w/o guarding, rebound, hernias, masses, or organomegaly.  Lymphatics: Unremarkable.  Musculoskeletal: Full ROM all peripheral extremities, joint stability, 5/5 strength, and normal gait.  Skin: Warm, dry without exposed rashes, lesions or ecchymosis apparent.  Neuro: Cranial nerves intact, reflexes equal bilaterally. Sensory-motor testing grossly intact. Tendon reflexes grossly intact.  Pysch: Alert & oriented x 3.  Insight and judgement nl & appropriate. No ideations.  Assessment and Plan:  1. Essential hypertension  - Continue monitor blood pressure at home. Continue diet/meds same. - TSH  2. Mixed hyperlipidemia  - Continue diet/meds, exercise,& lifestyle modifications. Continue monitor periodic cholesterol/liver & renal functions  - Lipid panel - TSH  3. Type 2 diabetes mellitus with stage 2 chronic kidney disease, without long-term current use of insulin (HCC)  - Continue diet, exercise, lifestyle modifications. Monitor  appropriate labs. - Hemoglobin A1c - Insulin, random  4. Vitamin D deficiency  - Continue supplementation. - VITAMIN D 25 Hydroxy   5. Precordial pain in a high risk individual with multiple preMorbid risk factors  - Ambulatory referral to Cardiology  6. ASCAD s/p PTCA (06/2012)  - Ambulatory referral to Cardiology  7. Gastroesophageal reflux disease   8. Medication management  - CBC with Differential/Platelet - BASIC METABOLIC PANEL WITH GFR - Hepatic function panel - Magnesium    Recommended regular exercise, BP monitoring, weight control, and discussed med and SE's. Recommended labs to assess and monitor clinical status. Further disposition pending results of labs. Over 30 minutes of exam, counseling, chart review was performed

## 2016-03-10 NOTE — Patient Instructions (Signed)

## 2016-03-11 ENCOUNTER — Other Ambulatory Visit: Payer: Self-pay | Admitting: Internal Medicine

## 2016-03-11 ENCOUNTER — Encounter: Payer: Self-pay | Admitting: Internal Medicine

## 2016-03-11 DIAGNOSIS — E0590 Thyrotoxicosis, unspecified without thyrotoxic crisis or storm: Secondary | ICD-10-CM

## 2016-03-11 LAB — VITAMIN D 25 HYDROXY (VIT D DEFICIENCY, FRACTURES): VIT D 25 HYDROXY: 79 ng/mL (ref 30–100)

## 2016-03-11 LAB — INSULIN, RANDOM: INSULIN: 24.1 u[IU]/mL — AB (ref 2.0–19.6)

## 2016-03-19 ENCOUNTER — Other Ambulatory Visit: Payer: Self-pay | Admitting: Internal Medicine

## 2016-03-20 DIAGNOSIS — C61 Malignant neoplasm of prostate: Secondary | ICD-10-CM | POA: Diagnosis not present

## 2016-03-23 ENCOUNTER — Encounter (HOSPITAL_COMMUNITY): Payer: Medicare Other

## 2016-03-23 DIAGNOSIS — M1611 Unilateral primary osteoarthritis, right hip: Secondary | ICD-10-CM | POA: Diagnosis not present

## 2016-03-23 DIAGNOSIS — M5416 Radiculopathy, lumbar region: Secondary | ICD-10-CM | POA: Diagnosis not present

## 2016-03-24 ENCOUNTER — Encounter (HOSPITAL_COMMUNITY): Payer: Medicare Other

## 2016-03-24 DIAGNOSIS — C61 Malignant neoplasm of prostate: Secondary | ICD-10-CM | POA: Diagnosis not present

## 2016-03-26 ENCOUNTER — Encounter (HOSPITAL_COMMUNITY)
Admission: RE | Admit: 2016-03-26 | Discharge: 2016-03-26 | Disposition: A | Discharge status: Home / Self Care | Payer: Medicare Other | Source: Ambulatory Visit | Attending: Internal Medicine | Admitting: Internal Medicine

## 2016-03-26 DIAGNOSIS — E0590 Thyrotoxicosis, unspecified without thyrotoxic crisis or storm: Secondary | ICD-10-CM | POA: Insufficient documentation

## 2016-03-26 MED ORDER — SODIUM IODIDE I 131 CAPSULE
8.1000 | Freq: Once | INTRAVENOUS | Status: AC | PRN
  Administered 2016-03-26: 8.1 via ORAL

## 2016-03-27 ENCOUNTER — Other Ambulatory Visit: Payer: Self-pay | Admitting: Internal Medicine

## 2016-03-27 ENCOUNTER — Encounter (HOSPITAL_COMMUNITY)
Admission: RE | Admit: 2016-03-27 | Discharge: 2016-03-27 | Disposition: A | Discharge status: Home / Self Care | Payer: Medicare Other | Source: Ambulatory Visit | Attending: Internal Medicine | Admitting: Internal Medicine

## 2016-03-27 DIAGNOSIS — E069 Thyroiditis, unspecified: Secondary | ICD-10-CM

## 2016-03-27 DIAGNOSIS — E0590 Thyrotoxicosis, unspecified without thyrotoxic crisis or storm: Secondary | ICD-10-CM | POA: Diagnosis not present

## 2016-03-27 MED ORDER — SODIUM PERTECHNETATE TC 99M INJECTION
10.2000 | Freq: Once | INTRAVENOUS | Status: AC | PRN
  Administered 2016-03-27: 10 via INTRAVENOUS

## 2016-04-02 ENCOUNTER — Encounter: Payer: Self-pay | Admitting: Internal Medicine

## 2016-04-02 ENCOUNTER — Ambulatory Visit (INDEPENDENT_AMBULATORY_CARE_PROVIDER_SITE_OTHER): Payer: Medicare Other | Admitting: Internal Medicine

## 2016-04-02 ENCOUNTER — Ambulatory Visit (HOSPITAL_COMMUNITY)
Admission: RE | Admit: 2016-04-02 | Discharge: 2016-04-02 | Disposition: A | Discharge status: Home / Self Care | Payer: Medicare Other | Source: Ambulatory Visit | Attending: Internal Medicine | Admitting: Internal Medicine

## 2016-04-02 VITALS — BP 94/58 | HR 74 | Temp 98.2°F | Resp 18 | Ht 69.0 in | Wt 192.0 lb

## 2016-04-02 DIAGNOSIS — R1084 Generalized abdominal pain: Secondary | ICD-10-CM | POA: Insufficient documentation

## 2016-04-02 DIAGNOSIS — H2513 Age-related nuclear cataract, bilateral: Secondary | ICD-10-CM | POA: Diagnosis not present

## 2016-04-02 DIAGNOSIS — R109 Unspecified abdominal pain: Secondary | ICD-10-CM | POA: Diagnosis not present

## 2016-04-02 DIAGNOSIS — E119 Type 2 diabetes mellitus without complications: Secondary | ICD-10-CM | POA: Diagnosis not present

## 2016-04-02 MED ORDER — PREDNISONE 20 MG PO TABS: ORAL_TABLET | ORAL | Status: DC

## 2016-04-02 MED ORDER — RANITIDINE HCL 300 MG PO TABS: 300.0000 mg | ORAL_TABLET | Freq: Two times a day (BID) | ORAL | Status: DC

## 2016-04-02 MED ORDER — PSEUDOEPH-BROMPHEN-DM 30-2-10 MG/5ML PO SYRP: 5.0000 mL | ORAL_SOLUTION | Freq: Four times a day (QID) | ORAL | Status: DC | PRN

## 2016-04-02 NOTE — Progress Notes (Signed)
   Subjective:    Patient ID: Michael Guzman, male    DOB: 04-18-1946, 70 y.o.   MRN: [id number]  Abdominal Pain Associated symptoms include hematuria and nausea. Pertinent negatives include no constipation, diarrhea, frequency or vomiting.  Patient presents to the office for evaluation of left lower stomach pains which has been going on for the last day.  He reports that the pain was very bad this morning.  He does have a gastroenterologist who reported that he needs to come see [place].  He reports that he is also having some chest pains and also some left sided chest pain.  He has been taking miralax.  He reports that he stopped the miralax recently.  He reports that he is having pain up to the chest.  He is having some shortness of breath and he feels like he can't breath well.  Last bowel movement was this morning.  He does feel like the bowel movement was really helpful relieving some of his abdominal pain.  No history of kidney stones.  He has been eating and drinking normally.  He did have some poor intake yesterday.     Review of Systems  HENT: Positive for congestion, ear pain, postnasal drip, sneezing and sore throat. Negative for trouble swallowing.   Respiratory: Positive for chest tightness. Negative for cough, shortness of breath and wheezing.   Cardiovascular: Positive for chest pain. Negative for palpitations and leg swelling.  Gastrointestinal: Positive for nausea and abdominal pain. Negative for vomiting, diarrhea, constipation, blood in stool and anal bleeding.  Genitourinary: Positive for hematuria. Negative for urgency, frequency, flank pain, enuresis and difficulty urinating.       Objective:   Physical Exam  Constitutional: He is oriented to person, place, and time. He appears well-developed and well-nourished. No distress.  HENT:  Head: Normocephalic.  Mouth/Throat: Oropharynx is clear and moist. No oropharyngeal exudate.  Eyes: Conjunctivae are normal. No scleral  icterus.  Neck: Normal range of motion. Neck supple. No JVD present. No thyromegaly present.  Cardiovascular: Normal rate, regular rhythm, normal heart sounds and intact distal pulses.  Exam reveals no gallop and no friction rub.   No murmur heard. Pulmonary/Chest: Effort normal and breath sounds normal. No respiratory distress. He has no wheezes. He has no rales. He exhibits no tenderness.  Abdominal: Soft. Bowel sounds are normal. He exhibits no distension and no mass. There is tenderness. There is no rebound and no guarding.  Musculoskeletal: Normal range of motion.  Lymphadenopathy:    He has no cervical adenopathy.  Neurological: He is alert and oriented to person, place, and time.  Skin: Skin is warm and dry. He is not diaphoretic.  Psychiatric: He has a normal mood and affect. His behavior is normal. Judgment and thought content normal.  Nursing note and vitals reviewed.   Filed Vitals:   04/02/16 1407  BP: 94/58  Pulse: 74  Temp: 98.2 F (36.8 C)  Resp: 18        Assessment & Plan:    1. Generalized abdominal pain -likely worsening reflux and consitpation. -ranitidine -amitiza 24 mcgs given -return to miralax daily once amitiza samples gone -avoid overeating -small frequent meals -gasx prn -stay upright after eating.   - DG Abd 1 View; Future - DG Chest 2 View; Future

## 2016-04-06 NOTE — Progress Notes (Signed)
Cardiology Office Note    Date:  04/09/2016   ID:  [name], [name] [birth year], MRN [id number]  PCP:  Alesia Richards, MD  Cardiologist:  Dr. Marlou Porch  CC: Chest pain  History of Present Illness:  Michael Guzman is a 70 y.o. male with a history of CAD s/p DES to mLAD and OM2 (06/2012), HTN, DMT2 and HLD who presents to clinic for evaluation of chest pain and weakness.   He was originally seen in 06/2012 for [place] and found to have double vessel CAD with high grade lesions in LAD and OM2 s/p stenting to both vessels. He continued to have chest pain and underwent repeat coronary angiography in 07/2012 showed patent stents.   He was last seen by Dr. Marlou Porch in 10/2015. He primarily complained of joint and back pain. Was seeing rheum for this. His BP was noted to be soft and enalapril decreased from 20 mg to 10 mg and metoprolol from 100mg  to 50 mg.  He saw PCP on 04/02/16 an BP was soft 94/58. He e mailed his PCP with follow up BP being 120/70 and wanted to know if he should cut back on BP meds. He was told to cut enalapril and Toprol XL in half and take other half if BP >150/90.  Today he was added on to my schedule for evaluation of chest pain. He was recently seen by PCP and in the ER for abdominal pain. Abd x ray showed stool backed up. Miralax is helping. Back in 2013 when he had his stent placement, his predominant symptom was weakness. Lately, he has been feeling very weak. When he picks up any of his fishing equipment he feels similarly so weak associated with SOB and diaphoresis. He has had some side pain on his left rib cage that radiates over into his left chest. No LE edema, orthopnea or PND. No dizziness or syncope.    Past Medical History:  Diagnosis Date  . Arthritis   . CAD (coronary artery disease)   . Chronic back pain   . Chronically dry eyes   . Diabetes mellitus without complication ([place])   . Elevated PSA    being monitored  . GERD (gastroesophageal reflux  disease)   . Hypertension     Past Surgical History:  Procedure Laterality Date  . APPENDECTOMY    . CARDIAC SURGERY     3 stent placed.  . COLONOSCOPY    . LEFT HEART CATHETERIZATION WITH CORONARY ANGIOGRAM N/A 06/27/2012   Procedure: LEFT HEART CATHETERIZATION WITH CORONARY ANGIOGRAM;  Surgeon: Candee Furbish, MD;  Location: [facility] CATH LAB;  Service: Cardiovascular;  Laterality: N/A;  . LEFT HEART CATHETERIZATION WITH CORONARY ANGIOGRAM N/A 07/25/2012   Procedure: LEFT HEART CATHETERIZATION WITH CORONARY ANGIOGRAM;  Surgeon: Sueanne Margarita, MD;  Location: [facility] CATH LAB;  Service: Cardiovascular;  Laterality: N/A;  . RADIOLOGY WITH ANESTHESIA N/A 12/03/2015   Procedure: MRI LUMBAR SPINE;  Surgeon: Medication Radiologist, MD;  Location: [place];  Service: Radiology;  Laterality: N/A;    Current Medications: Outpatient Medications Prior to Visit  Medication Sig Dispense Refill  . Artificial Tear Ointment (DRY EYES OP) Place 1 drop into both eyes daily as needed (for dry eyes).    Marland Kitchen aspirin 81 MG tablet Take 81 mg by mouth daily.    Marland Kitchen atorvastatin (LIPITOR) 40 MG tablet Take 0.5 tablets (20 mg total) by mouth daily. 45 tablet 3  . azelastine (ASTELIN) 0.1 % nasal spray Place 1 spray into  both nostrils 2 (two) times daily. Use in each nostril as directed    . canagliflozin (INVOKANA) 300 MG TABS tablet TAKE 1/2 TO 1 BY MOUTH DAILY AS DIRECTED FOR DIABETES 90 tablet 2  . cetirizine (ZYRTEC) 10 MG tablet Take 10 mg by mouth daily.    . Cholecalciferol (VITAMIN D3) 5000 UNITS CAPS Take 5,000 Units by mouth 2 (two) times daily.    . Cranberry 500 MG CAPS Take 500 mg by mouth daily.    Marland Kitchen gabapentin (NEURONTIN) 600 MG tablet Take 1 tablet 3 x day as needed for pain 270 tablet 2  . glipiZIDE (GLUCOTROL) 5 MG tablet Take 1 tablet by mouth 3  times daily for glucose  over 200 or as directed by  your doctor. 270 tablet 1  . Magnesium 250 MG TABS Take 250 mg by mouth daily.    . metFORMIN (GLUCOPHAGE-XR) 500  MG 24 hr tablet Take 1 to 2 tablets 2 x day or as directed for Diabetes 360 tablet 1  . methocarbamol (ROBAXIN) 500 MG tablet Take 500 mg by mouth daily as needed for muscle spasms.     . montelukast (SINGULAIR) 10 MG tablet Take 1 tablet (10 mg total) by mouth at bedtime. 90 tablet 3  . Multiple Vitamins-Minerals (MULTIVITAMIN ADULT PO) Take 1 tablet by mouth daily.    . nitroGLYCERIN (NITROSTAT) 0.4 MG SL tablet Place 1 tablet (0.4 mg total) under the tongue every 5 (five) minutes as needed for chest pain. 56 tablet 2  . Omega-3 Fatty Acids (FISH OIL) 1000 MG CAPS Take 1,000 mg by mouth daily.     . ondansetron (ZOFRAN ODT) 4 MG disintegrating tablet Take 1 tablet (4 mg total) by mouth every 8 (eight) hours as needed for nausea or vomiting. 20 tablet 0  . pantoprazole (PROTONIX) 40 MG tablet TAKE 1 TABLET BY MOUTH DAILY 90 tablet 3  . predniSONE (DELTASONE) 20 MG tablet 3 tabs po daily x 3 days, then 2 tabs x 3 days, then 1.5 tabs x 3 days, then 1 tab x 3 days, then 0.5 tabs x 3 days 27 tablet 0  . ranitidine (ZANTAC) 300 MG tablet Take 1 tablet (300 mg total) by mouth 2 (two) times daily. 90 tablet 1  . brompheniramine-pseudoephedrine-DM 30-2-10 MG/5ML syrup Take 5 mLs by mouth 4 (four) times daily as needed. 120 mL 0  . enalapril (VASOTEC) 5 MG tablet Take 1 tablet (5 mg total) by mouth daily. 90 tablet 2  . metoprolol succinate (TOPROL-XL) 25 MG 24 hr tablet Take 1 tablet (25 mg total) by mouth daily. Take with or immediately following a meal. 90 tablet 2  . glipiZIDE (GLUCOTROL) 5 MG tablet TAKE 1 BY MOUTH 3 TIMES DAILY FOR GLUCOSE OVER 200 OR AS DIRECTED BY YOUR DOCTOR 270 tablet 1  . hydrocortisone (ANUSOL-HC) 25 MG suppository Place 1 suppository (25 mg total) rectally 2 (two) times daily. (Patient taking differently: Place 25 mg rectally daily as needed for hemorrhoids. ) 24 suppository 6   No facility-administered medications prior to visit.      Allergies:   Review of patient's  allergies indicates no known allergies.   Social History   Social History  . Marital status: Married    Spouse name: N/A  . Number of children: 4  . Years of education: 2 y colleg   Occupational History  . 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 Smoking status: Former Smoker  Years: 50.00    Quit date: 07/27/2011  . Smokeless tobacco: Never Used  . Alcohol use Yes     Comment: occasional  . Drug use: No  . Sexual activity: Not Asked   Other Topics Concern  . None   Social History Narrative   Works as      Family History:  The patient's family history includes Diabetes in his sister.     ROS:   Please see the history of present illness.    ROS All other systems reviewed and are negative.   PHYSICAL EXAM:   VS:  BP 110/68   Pulse 84   Ht 5\' 9"  (1.753 m)   Wt 184 lb 6.4 oz (83.6 kg)   SpO2 97%   BMI 27.23 kg/m    GEN: Well nourished, well developed, in no acute distress  HEENT: normal  Neck: no JVD, carotid bruits, or masses Cardiac: RRR; no murmurs, rubs, or gallops,no edema  Respiratory:  clear to auscultation bilaterally, normal work of breathing GI: soft, nontender, nondistended, + BS MS: no deformity or atrophy  Skin: warm and dry, no rash Neuro:  Alert and Oriented x 3, Strength and sensation are intact Psych: euthymic mood, full affect  Wt Readings from Last 3 Encounters:  04/09/16 184 lb 6.4 oz (83.6 kg)  04/02/16 192 lb (87.1 kg)  03/10/16 190 lb 12.8 oz (86.5 kg)      Studies/Labs Reviewed:   EKG:  EKG is ordered today. NSR   Recent Labs: 03/10/2016: ALT 18; BUN 12; Creat 1.04; Hemoglobin 13.9; Magnesium 1.9; Platelets 209; Potassium 4.4; Sodium 138; TSH 0.39   Lipid Panel    Component Value Date/Time   CHOL 135 03/10/2016 1456   TRIG 459 (H) 03/10/2016 1456   HDL 45 03/10/2016 1456   CHOLHDL 3.0 03/10/2016 1456   VLDL NOT CALC 03/10/2016 1456   LDLCALC NOT CALC 03/10/2016 1456   LDLDIRECT 45.7 10/30/2013  0743    Additional studies/ records that were reviewed today include:  Cath 07/2012: IMPRESSIONS:  1. Normal left main coronary artery. 2. Normal left anterior descending artery and its branches with patent mid LAD stent. 3. Normal left circumflex artery and its branches with patent OM2 stent. 4. Normal right coronary artery with 50% proximal PDA stenosis. 5. Normal left ventricular systolic function.  LVEDP 2 mmHg.  Ejection fraction 60%.  RECOMMENDATION:   1.  Discussed results with Dr. Marlou Porch.  Patient states that he feels like he is driving on a straight road and then hits a bump and gets a hollow sensation in his chest followed by chest tightness that goes up into his head with dizziness.  Cath showed patent stents with 50% PDA lesion.  ? Arrhythmia causing symptoms.  Will get an outpatient [employer] monitor. 2. Due to epistaxis last PM and need for Brilinta, will get ENT consult today and monitor overnight.  Plan d/c home in am.   ASSESSMENT & PLAN:   Chest pain: atypical but having weakness reminiscent of sx prior to stenting placement in 2013. Will order lexiscan Myoview (says he cannot tolerate treadmill) to rule out ischemia.   CAD s/p DES to LAD and OM2 (06/2012): continue ASA, statin and BB. Repeat cath 07/2012 with patent stents  HTN: BP well controlled on current regimen, enalapril and Toprol recently decreased to 2.5mg  and 12.5mg  daily due to soft BPs. If BP >140/90, he will take other half  HLD: lipid panel done last month. LDL not able to be  calculated due to high TG (459). Continue atorva 40mg  daily.   DMT2: last HgA1c 8.3 in 02/2016  Medication Adjustments/Labs and Tests Ordered: Current medicines are reviewed at length with the patient today.  Concerns regarding medicines are outlined above.  Medication changes, Labs and Tests ordered today are listed in the Patient Instructions below. There are no Patient Instructions on file for this visit.   Signed, Angelena Form, PA-C  04/09/2016 8:28 AM    [facility] [address], [place], [place]  [address] Phone: [phone]; Fax: [phone]

## 2016-04-08 ENCOUNTER — Encounter: Payer: Self-pay | Admitting: Internal Medicine

## 2016-04-08 ENCOUNTER — Other Ambulatory Visit: Payer: Self-pay | Admitting: Internal Medicine

## 2016-04-09 ENCOUNTER — Other Ambulatory Visit: Payer: Self-pay | Admitting: *Deleted

## 2016-04-09 ENCOUNTER — Ambulatory Visit (INDEPENDENT_AMBULATORY_CARE_PROVIDER_SITE_OTHER): Payer: Medicare Other | Admitting: Physician Assistant

## 2016-04-09 ENCOUNTER — Encounter: Payer: Self-pay | Admitting: Physician Assistant

## 2016-04-09 VITALS — BP 110/68 | HR 84 | Ht 69.0 in | Wt 184.4 lb

## 2016-04-09 DIAGNOSIS — R079 Chest pain, unspecified: Secondary | ICD-10-CM | POA: Diagnosis not present

## 2016-04-09 DIAGNOSIS — E785 Hyperlipidemia, unspecified: Secondary | ICD-10-CM | POA: Diagnosis not present

## 2016-04-09 DIAGNOSIS — E118 Type 2 diabetes mellitus with unspecified complications: Secondary | ICD-10-CM | POA: Diagnosis not present

## 2016-04-09 DIAGNOSIS — I10 Essential (primary) hypertension: Secondary | ICD-10-CM

## 2016-04-09 MED ORDER — ENALAPRIL MALEATE 2.5 MG PO TABS
2.5000 mg | ORAL_TABLET | Freq: Every day | ORAL | 3 refills | Status: DC
Start: 2016-04-09 — End: 2017-02-03

## 2016-04-09 MED ORDER — ONETOUCH ULTRA SYSTEM W/DEVICE KIT: PACK | 0 refills | Status: DC

## 2016-04-09 MED ORDER — METOPROLOL SUCCINATE ER 25 MG PO TB24: 12.5000 mg | ORAL_TABLET | Freq: Every day | ORAL | 3 refills | Status: DC

## 2016-04-09 MED ORDER — GLUCOSE BLOOD VI STRP: ORAL_STRIP | 4 refills | Status: DC

## 2016-04-09 MED ORDER — METOPROLOL SUCCINATE ER 25 MG PO TB24: 12.5000 mg | ORAL_TABLET | Freq: Every day | ORAL | 1 refills | Status: DC

## 2016-04-09 MED ORDER — ENALAPRIL MALEATE 2.5 MG PO TABS: 2.5000 mg | ORAL_TABLET | Freq: Every day | ORAL | 5 refills | Status: DC

## 2016-04-09 NOTE — Patient Instructions (Signed)
Medication Instructions:  Your physician has recommended you make the following change in your medication:  1.  KEEP taking Enalapril 5 mg taking 1/2 tablet daily.  If your blood pressure runs 140/90 or higher, take the other 1/2 2.  KEEP taking Metoprolol 25 mg taking 1/2 tablet daily.  If your blood pressure runs 140/90 or higher, take the other 1/2.   Labwork: None ordered  Testing/Procedures: Your physician has requested that you have a lexiscan myoview. For further information please visit [email or web address]. Please follow instruction sheet, as given.    Follow-Up: Your physician recommends that you schedule a follow-up appointment in: WILL BE BASED UPON TEST RESULTS   Any Other Special Instructions Will Be Listed Below (If Applicable). Pharmacologic Stress Electrocardiogram A pharmacologic stress electrocardiogram is a heart (cardiac) test that uses nuclear imaging to evaluate the blood supply to your heart. This test may also be called a pharmacologic stress electrocardiography. Pharmacologic means that a medicine is used to increase your heart rate and blood pressure.  This stress test is done to find areas of poor blood flow to the heart by determining the extent of coronary artery disease (CAD). Some people exercise on a treadmill, which naturally increases the blood flow to the heart. For those people unable to exercise on a treadmill, a medicine is used. This medicine stimulates your heart and will cause your heart to beat harder and more quickly, as if you were exercising.  Pharmacologic stress tests can help determine:  The adequacy of blood flow to your heart during increased levels of activity in order to clear you for discharge home.  The extent of coronary artery blockage caused by CAD.  Your prognosis if you have suffered a heart attack.  The effectiveness of cardiac procedures done, such as an angioplasty, which can increase the circulation in your coronary  arteries.  Causes of chest pain or pressure. LET [facility] CARE PROVIDER KNOW ABOUT:  Any allergies you have.  All medicines you are taking, including vitamins, herbs, eye drops, creams, and over-the-counter medicines.  Previous problems you or members of your family have had with the use of anesthetics.  Any blood disorders you have.  Previous surgeries you have had.  Medical conditions you have.  Possibility of pregnancy, if this applies.  If you are currently breastfeeding. RISKS AND COMPLICATIONS Generally, this is a safe procedure. However, as with any procedure, complications can occur. Possible complications include:  You develop pain or pressure in the following areas:  Chest.  Jaw or neck.  Between your shoulder blades.  Radiating down your left arm.  Headache.  Dizziness or light-headedness.  Shortness of breath.  Increased or irregular heartbeat.  Low blood pressure.  Nausea or vomiting.  Flushing.  Redness going up the arm and slight pain during injection of medicine.  Heart attack (rare). BEFORE THE PROCEDURE   Avoid all forms of caffeine for 24 hours before your test or as directed by your health care provider. This includes coffee, tea (even decaffeinated tea), caffeinated sodas, chocolate, cocoa, and certain pain medicines.  Follow your health care provider's instructions regarding eating and drinking before the test.  Take your medicines as directed at regular times with water unless instructed otherwise. Exceptions may include:  If you have diabetes, ask how you are to take your insulin or pills. It is common to adjust insulin dosing the morning of the test.  If you are taking beta-blocker medicines, it is important to talk to your health care  provider about these medicines well before the date of your test. Taking beta-blocker medicines may interfere with the test. In some cases, these medicines need to be changed or stopped 24 hours or  more before the test.  If you wear a nitroglycerin patch, it may need to be removed prior to the test. Ask your health care provider if the patch should be removed before the test.  If you use an inhaler for any breathing condition, bring it with you to the test.  If you are an outpatient, bring a snack so you can eat right after the stress phase of the test.  Do not smoke for 4 hours prior to the test or as directed by your health care provider.  Do not apply lotions, powders, creams, or oils on your chest prior to the test.  Wear comfortable shoes and clothing. Let your health care provider know if you were unable to complete or follow the preparations for your test. PROCEDURE   Multiple patches (electrodes) will be put on your chest. If needed, small areas of your chest may be shaved to get better contact with the electrodes. Once the electrodes are attached to your body, multiple wires will be attached to the electrodes, and your heart rate will be monitored.  An IV access will be started. A nuclear trace (isotope) is given. The isotope may be given intravenously, or it may be swallowed. Nuclear refers to several types of radioactive isotopes, and the nuclear isotope lights up the arteries so that the nuclear images are clear. The isotope is absorbed by your body. This results in low radiation exposure.  A resting nuclear image is taken to show how your heart functions at rest.  A medicine is given through the IV access.  A second scan is done about 1 hour after the medicine injection and determines how your heart functions under stress.  During this stress phase, you will be connected to an electrocardiogram machine. Your blood pressure and oxygen levels will be monitored. AFTER THE PROCEDURE   Your heart rate and blood pressure will be monitored after the test.  You may return to your normal schedule, including diet,activities, and medicines, unless your health care provider  tells you otherwise.   This information is not intended to replace advice given to you by your health care provider. Make sure you discuss any questions you have with your health care provider.   Document Released: 01/17/2009 Document Revised: 09/05/2013 Document Reviewed: 05/08/2013 Elsevier Interactive Patient Education [employer].     If you need a refill on your cardiac medications before your next appointment, please call your pharmacy.

## 2016-04-09 NOTE — Addendum Note (Signed)
Addended by: Gaetano Net on: 04/09/2016 03:36 PM   Modules accepted: Orders

## 2016-04-15 ENCOUNTER — Telehealth (HOSPITAL_COMMUNITY): Payer: Self-pay | Admitting: *Deleted

## 2016-04-15 NOTE — Telephone Encounter (Signed)
Patient given detailed instructions per Myocardial Perfusion Study Information Sheet for the test on 04/21/16 at 0730. Patient notified to arrive 15 minutes early and that it is imperative to arrive on time for appointment to keep from having the test rescheduled.  If you need to cancel or reschedule your appointment, please call the office within 24 hours of your appointment. Failure to do so may result in a cancellation of your appointment, and a $50 no show fee. Patient verbalized understanding.Michael Guzman, Michael Guzman

## 2016-04-16 DIAGNOSIS — C61 Malignant neoplasm of prostate: Secondary | ICD-10-CM | POA: Diagnosis not present

## 2016-04-21 ENCOUNTER — Ambulatory Visit: Payer: Self-pay | Admitting: Internal Medicine

## 2016-04-21 ENCOUNTER — Ambulatory Visit (HOSPITAL_COMMUNITY): Payer: Medicare Other | Attending: Cardiology

## 2016-04-21 DIAGNOSIS — R079 Chest pain, unspecified: Secondary | ICD-10-CM | POA: Diagnosis not present

## 2016-04-21 DIAGNOSIS — Z87891 Personal history of nicotine dependence: Secondary | ICD-10-CM | POA: Diagnosis not present

## 2016-04-21 DIAGNOSIS — R9439 Abnormal result of other cardiovascular function study: Secondary | ICD-10-CM | POA: Insufficient documentation

## 2016-04-21 DIAGNOSIS — R5383 Other fatigue: Secondary | ICD-10-CM | POA: Diagnosis not present

## 2016-04-21 DIAGNOSIS — R0609 Other forms of dyspnea: Secondary | ICD-10-CM | POA: Insufficient documentation

## 2016-04-21 DIAGNOSIS — R0789 Other chest pain: Secondary | ICD-10-CM | POA: Insufficient documentation

## 2016-04-21 DIAGNOSIS — I10 Essential (primary) hypertension: Secondary | ICD-10-CM | POA: Insufficient documentation

## 2016-04-21 LAB — MYOCARDIAL PERFUSION IMAGING
CHL CUP NUCLEAR SRS: 2
CHL CUP NUCLEAR SSS: 4
CHL CUP RESTING HR STRESS: 82 {beats}/min
LHR: 0.26
LV dias vol: 72 mL (ref 62–150)
LV sys vol: 27 mL
NUC STRESS TID: 1.06
Peak HR: 106 {beats}/min
SDS: 2

## 2016-04-21 MED ORDER — TECHNETIUM TC 99M TETROFOSMIN IV KIT
10.3000 | PACK | Freq: Once | INTRAVENOUS | Status: AC | PRN
  Administered 2016-04-21: 10 via INTRAVENOUS
  Filled 2016-04-21: qty 10

## 2016-04-21 MED ORDER — TECHNETIUM TC 99M TETROFOSMIN IV KIT
31.2000 | PACK | Freq: Once | INTRAVENOUS | Status: AC | PRN
  Administered 2016-04-21: 31.2 via INTRAVENOUS
  Filled 2016-04-21: qty 31

## 2016-04-21 MED ORDER — REGADENOSON 0.4 MG/5ML IV SOLN
0.4000 mg | Freq: Once | INTRAVENOUS | Status: AC
  Administered 2016-04-21: 0.4 mg via INTRAVENOUS

## 2016-04-23 ENCOUNTER — Ambulatory Visit (INDEPENDENT_AMBULATORY_CARE_PROVIDER_SITE_OTHER): Payer: Medicare Other | Admitting: Internal Medicine

## 2016-04-23 ENCOUNTER — Encounter: Payer: Self-pay | Admitting: Internal Medicine

## 2016-04-23 VITALS — BP 106/62 | HR 74 | Temp 98.2°F | Resp 16 | Ht 69.0 in | Wt 185.0 lb

## 2016-04-23 DIAGNOSIS — Z0001 Encounter for general adult medical examination with abnormal findings: Secondary | ICD-10-CM | POA: Diagnosis not present

## 2016-04-23 DIAGNOSIS — K219 Gastro-esophageal reflux disease without esophagitis: Secondary | ICD-10-CM | POA: Diagnosis not present

## 2016-04-23 DIAGNOSIS — I10 Essential (primary) hypertension: Secondary | ICD-10-CM | POA: Diagnosis not present

## 2016-04-23 DIAGNOSIS — Z23 Encounter for immunization: Secondary | ICD-10-CM | POA: Diagnosis not present

## 2016-04-23 DIAGNOSIS — Z9861 Coronary angioplasty status: Secondary | ICD-10-CM | POA: Diagnosis not present

## 2016-04-23 DIAGNOSIS — E559 Vitamin D deficiency, unspecified: Secondary | ICD-10-CM

## 2016-04-23 DIAGNOSIS — E782 Mixed hyperlipidemia: Secondary | ICD-10-CM | POA: Diagnosis not present

## 2016-04-23 DIAGNOSIS — E1142 Type 2 diabetes mellitus with diabetic polyneuropathy: Secondary | ICD-10-CM

## 2016-04-23 DIAGNOSIS — R6889 Other general symptoms and signs: Secondary | ICD-10-CM | POA: Diagnosis not present

## 2016-04-23 DIAGNOSIS — E1122 Type 2 diabetes mellitus with diabetic chronic kidney disease: Secondary | ICD-10-CM

## 2016-04-23 DIAGNOSIS — I2510 Atherosclerotic heart disease of native coronary artery without angina pectoris: Secondary | ICD-10-CM

## 2016-04-23 DIAGNOSIS — Z79899 Other long term (current) drug therapy: Secondary | ICD-10-CM

## 2016-04-23 DIAGNOSIS — N182 Chronic kidney disease, stage 2 (mild): Secondary | ICD-10-CM | POA: Diagnosis not present

## 2016-04-23 DIAGNOSIS — Z0000 Encounter for general adult medical examination without abnormal findings: Secondary | ICD-10-CM

## 2016-04-23 MED ORDER — CETIRIZINE HCL 10 MG PO TABS: 10.0000 mg | ORAL_TABLET | Freq: Every day | ORAL | 2 refills | Status: DC

## 2016-04-23 MED ORDER — FLUTICASONE PROPIONATE 50 MCG/ACT NA SUSP: 2.0000 | Freq: Every day | NASAL | 0 refills | Status: DC

## 2016-04-23 MED ORDER — CHLORPHEN-PE-ACETAMINOPHEN 4-10-325 MG PO TABS: 1.0000 | ORAL_TABLET | Freq: Three times a day (TID) | ORAL | 1 refills | Status: DC

## 2016-04-23 NOTE — Patient Instructions (Signed)
Please start taking the zyrtec daily.  Please take the norel AD 3 times per day.  Please take the singulair or monteleukast daily.  Please use the astelin 2 sprays per nostril twice daily.  Please use the flonase 2 sprays per nostril once before bedtime.   Please use saline to help rinse out your nose.    You can use benadryl at nighttime 50 mg to help dry up the congestion.

## 2016-04-23 NOTE — Progress Notes (Signed)
MEDICARE ANNUAL WELLNESS VISIT AND FOLLOW UP Assessment:    1. Need for prophylactic vaccination against Streptococcus pneumoniae (pneumococcus)  - Pneumococcal conjugate vaccine 13-valent  2. Essential hypertension -well controlled -cont dash diet -monitor at home -followed by cards too  3. ASCAD s/p PTCA (06/2012) -followed by cards -recent stress test was normal  4. Gastroesophageal reflux disease, esophagitis presence not specified -cont meds -well controlled  5. Type 2 diabetes mellitus with stage 2 chronic kidney disease, without long-term current use of insulin (HCC) -cont meds -cont CBG monitoring at home -diet and exercise as tolerated  6. Diabetic peripheral neuropathy (HCC) -cont tight blood sugar control -check feet daily  7. Mixed hyperlipidemia -cont meds -not at goal   8. Vitamin D deficiency -cont Vit d  9. Medication management -cont quarterly labs  10.  Medicare wellness -due next year  Over 30 minutes of exam, counseling, chart review, and critical decision making was performed  Future Appointments Date Time Provider [place]  05/12/2016 1:15 PM Philemon Kingdom, MD [facility] None  06/11/2016 11:00 AM Starlyn Skeans, PA-C [facility] None  10/06/2016 2:00 PM [name], MD [facility] None     Plan:   During the course of the visit the patient was educated and counseled about appropriate screening and preventive services including:    Pneumococcal vaccine   Influenza vaccine  Prevnar 13  Td vaccine  Screening electrocardiogram  Colorectal cancer screening  Diabetes screening  Glaucoma screening  Nutrition counseling    Subjective:  Michael Guzman is a 70 y.o. male who presents for Medicare Annual Wellness Visit and 3 month follow up for HTN, hyperlipidemia, prediabetes, and vitamin D Def.   His blood pressure has been controlled at home, today their BP is BP: 106/62 He does not workout. He denies  chest pain, shortness of breath, dizziness.  He is walking regularly with his wife.  He is getting out and going fishing as well.  He is on cholesterol medication and denies myalgias. His cholesterol is not at goal. The cholesterol last visit was:  Lab Results  Component Value Date   CHOL 135 03/10/2016   HDL 45 03/10/2016   LDLCALC NOT CALC 03/10/2016   LDLDIRECT 45.7 10/30/2013   TRIG 459 (H) 03/10/2016   CHOLHDL 3.0 03/10/2016   He is diabetic with some issues with non-compliance with diet.  He has been working on his diet but does note he was recently on prednisone for a sinus infection.  He is due to see Dr. Letta Median next month for both his thryroid and his diabetes.   : Lab Results  Component Value Date   HGBA1C 8.3 (H) 03/10/2016   Last GFR Lab Results  Component Value Date   GFRNONAA 73 03/10/2016     Lab Results  Component Value Date   GFRAA 85 03/10/2016   Patient is on Vitamin D supplement.   Lab Results  Component Value Date   VD25OH 79 03/10/2016     Patient reports that he has been having some head congestion and some sinus pressure.  He reports that the prednisone was helping.  He states that he has been eating and drinking a lot.  He is taking sinus medication at nighttime.   Medication Review: Current Outpatient Prescriptions on File Prior to Visit  Medication Sig Dispense Refill  . Artificial Tear Ointment (DRY EYES OP) Place 1 drop into both eyes daily as needed (for dry eyes).    Marland Kitchen aspirin 81 MG tablet Take  81 mg by mouth daily.    Marland Kitchen atorvastatin (LIPITOR) 40 MG tablet Take 0.5 tablets (20 mg total) by mouth daily. 45 tablet 3  . azelastine (ASTELIN) 0.1 % nasal spray Place 1 spray into both nostrils 2 (two) times daily. Use in each nostril as directed    . Blood Glucose Monitoring Suppl (ONE TOUCH ULTRA SYSTEM KIT) w/Device KIT Check blood sugar 1 time daily-DX-E11.22 1 each 0  . canagliflozin (INVOKANA) 300 MG TABS tablet TAKE 1/2 TO 1 BY MOUTH DAILY AS  DIRECTED FOR DIABETES 90 tablet 2  . cetirizine (ZYRTEC) 10 MG tablet Take 10 mg by mouth daily.    . Cholecalciferol (VITAMIN D3) 5000 UNITS CAPS Take 5,000 Units by mouth 2 (two) times daily.    . Cranberry 500 MG CAPS Take 500 mg by mouth daily.    . enalapril (VASOTEC) 2.5 MG tablet Take 1 tablet (2.5 mg total) by mouth daily. 90 tablet 3  . gabapentin (NEURONTIN) 600 MG tablet Take 1 tablet 3 x day as needed for pain 270 tablet 2  . glipiZIDE (GLUCOTROL) 5 MG tablet Take 1 tablet by mouth 3  times daily for glucose  over 200 or as directed by  your doctor. 270 tablet 1  . glucose blood test strip Check blood sugar 1 time daily-DX-E11.22 100 each 4  . hydrocortisone (ANUSOL-HC) 25 MG suppository Place 25 mg rectally 2 (two) times daily as needed for hemorrhoids or itching.    . Magnesium 250 MG TABS Take 250 mg by mouth daily.    . metFORMIN (GLUCOPHAGE-XR) 500 MG 24 hr tablet Take 1 to 2 tablets 2 x day or as directed for Diabetes 360 tablet 1  . methocarbamol (ROBAXIN) 500 MG tablet Take 500 mg by mouth daily as needed for muscle spasms.     . metoprolol succinate (TOPROL-XL) 25 MG 24 hr tablet Take 0.5 tablets (12.5 mg total) by mouth daily. Take with or immediately following a meal. 45 tablet 3  . montelukast (SINGULAIR) 10 MG tablet Take 1 tablet (10 mg total) by mouth at bedtime. 90 tablet 3  . Multiple Vitamins-Minerals (MULTIVITAMIN ADULT PO) Take 1 tablet by mouth daily.    . nitroGLYCERIN (NITROSTAT) 0.4 MG SL tablet Place 1 tablet (0.4 mg total) under the tongue every 5 (five) minutes as needed for chest pain. 56 tablet 2  . Omega-3 Fatty Acids (FISH OIL) 1000 MG CAPS Take 1,000 mg by mouth daily.     . ondansetron (ZOFRAN ODT) 4 MG disintegrating tablet Take 1 tablet (4 mg total) by mouth every 8 (eight) hours as needed for nausea or vomiting. 20 tablet 0  . OVER THE COUNTER MEDICATION Delsum cough medicine    . pantoprazole (PROTONIX) 40 MG tablet TAKE 1 TABLET BY MOUTH DAILY 90  tablet 3  . ranitidine (ZANTAC) 300 MG tablet Take 1 tablet (300 mg total) by mouth 2 (two) times daily. 90 tablet 1   No current facility-administered medications on file prior to visit.     Allergies: No Known Allergies  Current Problems (verified) has Hypertension (1991); T2_NIDDM w/ CKD2 (GFR 64 ml/min); GERD; ASCAD s/p PTCA (06/2012); Mixed hyperlipidemia; Vitamin D deficiency; Medication management; and Diabetic peripheral neuropathy ([place]) on his problem list.  Screening Tests Immunization History  Administered Date(s) Administered  . Influenza Split 06/26/2012  . Influenza, High Dose Seasonal PF 06/20/2015  . Influenza-Unspecified 06/14/2013, 06/25/2014  . Pneumococcal-Unspecified 06/16/2010  . Td 09/17/2003  . Tdap 11/26/2013  . Zoster  07/12/2012    Preventative care: Last colonoscopy: 2011  Prior vaccinations: TD or Tdap: 2015  Influenza: 2016  Pneumococcal: 2011 Prevnar13: 2017 Shingles/Zostavax: 2013  Names of Other Physician/Practitioners you currently use: 1. [facility] here for primary care 2. Dr. Gershon Crane, eye doctor, last visit 2017 3. Dr. Gerlene Burdock, dentist, last visit 2017 Patient Care Team: [name], MD as PCP - General (Internal Medicine)  Surgical: He  has a past surgical history that includes Appendectomy; Cardiac surgery; left heart catheterization with coronary angiogram (N/A, 06/27/2012); left heart catheterization with coronary angiogram (N/A, 07/25/2012); Colonoscopy; and Radiology with anesthesia (N/A, 12/03/2015). Family His family history includes Diabetes in his sister. Social history  He reports that he quit smoking about 4 years ago. He quit after 50.00 years of use. He has never used smokeless tobacco. He reports that he drinks alcohol. He reports that he does not use drugs.  MEDICARE WELLNESS OBJECTIVES: Physical activity:   Cardiac risk factors:   Depression/mood screen:   Depression screen  [facility] 2/9 03/11/2016  Decreased Interest 0  Down, Depressed, Hopeless 0  PHQ - 2 Score 0    ADLs:  In your present state of health, do you have any difficulty performing the following activities: 03/11/2016 12/04/2015  Hearing? N N  Vision? N N  Difficulty concentrating or making decisions? N N  Walking or climbing stairs? N N  Dressing or bathing? N N  Doing errands, shopping? N N  Some recent data might be hidden     Cognitive Testing  Alert? Yes  Normal Appearance?Yes  Oriented to person? Yes  Place? Yes   Time? Yes  Recall of three objects?  Yes  Can perform simple calculations? Yes  Displays appropriate judgment?Yes  Can read the correct time from a watch face?Yes  EOL planning:     Objective:   Today's Vitals   04/23/16 1119  BP: 106/62  Pulse: 74  Resp: 16  Temp: 98.2 F (36.8 C)  TempSrc: Temporal  Weight: 185 lb (83.9 kg)  Height: 5' 9"  (1.753 m)   Body mass index is 27.32 kg/m.  General appearance: alert, no distress, WD/WN, male HEENT: normocephalic, sclerae anicteric, TMs pearly, nares patent, no discharge or erythema, pharynx normal Oral cavity: MMM, no lesions Neck: supple, no lymphadenopathy, no thyromegaly, no masses Heart: RRR, normal S1, S2, no murmurs Lungs: CTA bilaterally, no wheezes, rhonchi, or rales Abdomen: +bs, soft, non tender, non distended, no masses, no hepatomegaly, no splenomegaly Musculoskeletal: nontender, no swelling, no obvious deformity Extremities: no edema, no cyanosis, no clubbing Pulses: 2+ symmetric, upper and lower extremities, normal cap refill Neurological: alert, oriented x 3, CN2-12 intact, strength normal upper extremities and lower extremities, sensation normal throughout, DTRs 2+ throughout, no cerebellar signs, gait normal Psychiatric: normal affect, behavior normal, pleasant   Medicare Attestation I have personally reviewed: The patient's medical and social history Their use of alcohol, tobacco or illicit  drugs Their current medications and supplements The patient's functional ability including ADLs,fall risks, home safety risks, cognitive, and hearing and visual impairment Diet and physical activities Evidence for depression or mood disorders  The patient's weight, height, BMI, and visual acuity have been recorded in the chart.  I have made referrals, counseling, and provided education to the patient based on review of the above and I have provided the patient with a written personalized care plan for preventive services.     Starlyn Skeans, PA-C   04/23/2016

## 2016-04-24 ENCOUNTER — Encounter: Payer: Self-pay | Admitting: Medical Oncology

## 2016-04-24 NOTE — Progress Notes (Signed)
Oncology Nurse Navigator Documentation  Oncology Nurse Navigator Flowsheets 04/24/2016  Navigator Location [facility]  Navigator Encounter Type Introductory phone call  Abnormal Finding Date 03/20/2016  Confirmed Diagnosis Date 04/16/2016  Barriers/Navigation Needs Education  Education Understanding Cancer/ Treatment Options  Interventions Education Method  Education Method Teach-back;Verbal;Written  Support Groups/Services Friends and Family  Acuity Level 1  Acuity Level 1 Initial guidance, education and coordination as needed  Time Spent with Patient 15  I called pt to introduce myself as the Prostate Nurse Navigator and the Coordinator of the Prostate [place].  1. I confirmed with the patient he is aware of his referral to the clinic 05/05/16 arriving at 12:15pm.  2. I discussed the format of the clinic and the physicians he will be seeing that day.  3. I discussed where the clinic is located and how to contact me.  4. I confirmed his address and informed him I would be mailing a packet of information and forms to be completed. I asked him to bring them with him the day of his appointment.   He voiced understanding of the above. I asked him to call me if he has any questions or concerns regarding his appointments or the forms he needs to complete.

## 2016-05-01 ENCOUNTER — Encounter: Payer: Self-pay | Admitting: Radiation Oncology

## 2016-05-01 NOTE — Progress Notes (Signed)
GU Location of Tumor / Histology: prostatic adenocarcinoma  If Prostate Cancer, Gleason Score is (4 + 4) and PSA is (7.95) July 2017  [name] had a previous positive prostate biopsy in February 2016  Biopsies of prostate (if applicable) revealed:    Past/Anticipated interventions by urology, if any: prostate biopsy x 2 and referral to [facility]  Past/Anticipated interventions by medical oncology, if any: no  Weight changes, if any: no  Bowel/Bladder complaints, if any: weak urine stream    Nausea/Vomiting, if any: no  Pain issues, if any:  Back pain and joint pain  SAFETY ISSUES:  Prior radiation? no  Pacemaker/ICD? no  Possible current pregnancy? no  Is the patient on methotrexate? no  Current Complaints / other details:  70 year old male. Married. Former smoker. Reports fatigue. Prostate volume: 33.98 grams.

## 2016-05-03 ENCOUNTER — Other Ambulatory Visit: Payer: Self-pay | Admitting: Internal Medicine

## 2016-05-03 ENCOUNTER — Encounter: Payer: Self-pay | Admitting: Internal Medicine

## 2016-05-03 MED ORDER — EMPAGLIFLOZIN 25 MG PO TABS: 25.0000 mg | ORAL_TABLET | Freq: Every day | ORAL | 1 refills | Status: DC

## 2016-05-04 ENCOUNTER — Encounter: Payer: Self-pay | Admitting: Medical Oncology

## 2016-05-04 ENCOUNTER — Telehealth: Payer: Self-pay | Admitting: Medical Oncology

## 2016-05-04 NOTE — Telephone Encounter (Signed)
Spoke with Michael Guzman to confirm his appointment for Pro[facility] 05/05/16 arriving at 12:30pm. I reminded him to bring his completed paper work.

## 2016-05-05 ENCOUNTER — Encounter: Payer: Self-pay | Admitting: Medical Oncology

## 2016-05-05 ENCOUNTER — Encounter: Payer: Self-pay | Admitting: Radiation Oncology

## 2016-05-05 ENCOUNTER — Ambulatory Visit (HOSPITAL_BASED_OUTPATIENT_CLINIC_OR_DEPARTMENT_OTHER): Payer: Medicare Other | Admitting: Oncology

## 2016-05-05 ENCOUNTER — Ambulatory Visit
Admission: RE | Admit: 2016-05-05 | Discharge: 2016-05-05 | Disposition: A | Discharge status: Home / Self Care | Payer: Medicare Other | Source: Ambulatory Visit | Attending: Radiation Oncology | Admitting: Radiation Oncology

## 2016-05-05 ENCOUNTER — Encounter: Payer: Self-pay | Admitting: General Practice

## 2016-05-05 DIAGNOSIS — C61 Malignant neoplasm of prostate: Secondary | ICD-10-CM | POA: Diagnosis not present

## 2016-05-05 DIAGNOSIS — R972 Elevated prostate specific antigen [PSA]: Secondary | ICD-10-CM | POA: Diagnosis not present

## 2016-05-05 HISTORY — DX: Malignant neoplasm of prostate: C61

## 2016-05-05 NOTE — Consult Note (Signed)
[facility]     05/05/2016   --------------------------------------------------------------------------------   Michael Guzman  MRN: [id number]  PRIMARY CARE:  [name], MD  DOB: 1946-04-09, 70 year old Male  REFERRING:    SSN: [id number]  PROVIDER:  Festus Aloe, M.D.    TREATING:  Raynelle Bring, M.D.    LOCATION:  [facility] [phone]   --------------------------------------------------------------------------------   CC/HPI: CC: Prostate Cancer   Physician requesting consult: Dr. Eda Keys  PCP: Dr. [name]  Location of consult: [facility]   Mr. Fitts is a 70 year old gentleman who was initially diagnosed with prostate cancer in February 2016 when he presented with a PSA of 5.12. He underwent his initial biopsy in February 2016 demonstrating Gleason 3+3=6 adenocarcinoma in 2 out of 12 biopsy cores. He elected active surveillance management. He initially refused a repeat biopsy for surveillance due to discomfort with the initial biopsy. However, his PSA gradually rose to 7.95. He eventually agreed to another biopsy performed on 04/16/16. This demonstrated upgraded Gleason 4+4=8 adenocarcinoma of the prostate with 4 out of 12 biopsy cores positive.   His main concern is being able to take care of his wife who is on dialysis M-W-F for ESRD that developed last year after her heart valve replacement.   Family history: None   Imaging studies: He had a CT scan of the abdomen and pelvis in May for unrelated problems. This indicated no pelvic lymphadenopathy. He has not undergone bone imaging.   PMH: He has a history of GERD, diabetes, hypertension, and hypercholesterolemia. He has CAD s/p cardiac stent placement in the past. He had a stress test earlier this month that was negative. EF was 63%. He is followed by Dr. Marlou Porch.  PSH: No abdominal surgery.   TNM stage: cT1c Nx  Mx  PSA: 7.95  Gleason score: 4+4=8  Biopsy (04/16/16): 4/12 cores positive  Left: L lateral apex (10%, 3+3=6), L lateral base (30%, 3+3=6)  Right: R apex (5%, 4+3=7), R lateral apex (10%, 4+4=8)  Prostate volume: 34.0 cc   Nomogram  OC disease: 26%  EPE: 71%  SVI: 16%  LNI: 17%  PFS (5 year, 10 year): 45%,30%   Urinary function: IPSS is 10. He is adequately treated with tamsulosin.  Erectile function: SHIM score is 5. He has erectile dysfunction and his wife is on dialysis. Sexual function is not a priority.     ALLERGIES: No Allergies    MEDICATIONS: Tamsulosin Hcl 0.4 mg capsule, ext release 24 hr 1 capsule PO Q HS  Tamsulosin Hcl 0.4 mg capsule, ext release 24 hr 1 capsule PO Q HS  Aspirin 81 MG TABS Oral  Atorvastatin Calcium 40 MG Oral Tablet Oral  Enalapril Maleate 20 MG Oral Tablet Oral  Gabapentin 600 mg tablet  GlipiZIDE 5 MG Oral Tablet Oral  Invokana 300 MG Oral Tablet Oral  Magnesium TABS Oral  MetFORMIN HCl - 500 MG Oral Tablet Oral  Metoprolol Tartrate 100 MG Oral Tablet Oral  Montelukast Sodium 10 mg tablet  Pantoprazole Sodium 40 MG Oral Tablet Delayed Release Oral  Vitamin D TABS Oral     GU PSH: Prostate Needle Biopsy - 04/16/2016    NON-GU PSH: Appendectomy    GU PMH: BPH w/LUTS - 04/16/2016 Prostate Cancer - 03/24/2016, Prostate Cancer, Cancer of prostate w/low recurrence risk (T1-2a, Gleason<7 & PSA<10) - 08/20/2015    NON-GU PMH: Personal history of other diseases of  the circulatory system, History of hypertension - 2016, History of cardiac disorder, - 2016 Personal history of other diseases of the digestive system, History of esophageal reflux - 2016 Personal history of other diseases of the musculoskeletal system and connective tissue, History of arthritis - 2016 Personal history of other endocrine, nutritional and metabolic disease, History of diabetes mellitus - 2016, History of hypercholesterolemia, - 2016 Atherosclerotic heart disease of native  coronary artery without angina pectoris    FAMILY HISTORY: No pertinent family history - Runs In Family   SOCIAL HISTORY: Marital Status: Married Current Smoking Status: Patient has never smoked.  Has never drank.  Does not drink caffeine.     Notes: Former smoker, Father deceased, Alcohol use, Occupation, Caffeine use, Mother deceased, Married, Number of children   REVIEW OF SYSTEMS:    GU Review Male:   Patient denies frequent urination, hard to postpone urination, burning/ pain with urination, get up at night to urinate, leakage of urine, stream starts and stops, trouble starting your streams, and have to strain to urinate .  Gastrointestinal (Upper):   Patient denies nausea and vomiting.  Gastrointestinal (Lower):   Patient denies diarrhea and constipation.  Constitutional:   Patient denies fever, night sweats, weight loss, and fatigue.  Skin:   Patient denies skin rash/ lesion and itching.  Eyes:   Patient denies blurred vision and double vision.  Ears/ Nose/ Throat:   Patient denies sore throat and sinus problems.  Hematologic/Lymphatic:   Patient denies swollen glands and easy bruising.  Cardiovascular:   Patient denies leg swelling and chest pains.  Respiratory:   Patient denies cough and shortness of breath.  Endocrine:   Patient denies excessive thirst.  Musculoskeletal:   Patient denies back pain and joint pain.  Neurological:   Patient denies headaches and dizziness.  Psychologic:   Patient denies depression and anxiety.   VITAL SIGNS: None   GU PHYSICAL EXAMINATION:    Prostate: 40 cc. No nodularity or induration.   MULTI-SYSTEM PHYSICAL EXAMINATION:    Constitutional: Well-nourished. No physical deformities. Normally developed. Good grooming.  Neck: Neck symmetrical, not swollen. Normal tracheal position.  Respiratory: No labored breathing, no use of accessory muscles.   Cardiovascular: Normal temperature, normal extremity pulses, no swelling, no varicosities. RRR.   Lymphatic: No enlargement of neck, axillae, groin.  Skin: No paleness, no jaundice, no cyanosis. No lesion, no ulcer, no rash.  Neurologic / Psychiatric: Oriented to time, oriented to place, oriented to person. No depression, no anxiety, no agitation.  Gastrointestinal: No mass, no tenderness, no rigidity, non obese abdomen.  Eyes: Normal conjunctivae. Normal eyelids.  Ears, Nose, Mouth, and Throat: Left ear no scars, no lesions, no masses. Right ear no scars, no lesions, no masses. Nose no scars, no lesions, no masses. Normal hearing. Normal lips.  Musculoskeletal: Normal gait and station of head and neck.     PAST DATA REVIEWED:  Source Of History:  Patient  Lab Test Review:   PSA  Records Review:   Pathology Reports  X-Ray Review: C.T. Abdomen/Pelvis: Reviewed Films.     03/20/16 08/14/15 03/27/15 02/06/15  PSA  Total PSA 7.95  5.71  6.12  7.10     PROCEDURES: None   ASSESSMENT:      ICD-10 Details  1 GU:   Prostate Cancer - C61    PLAN:           Document Letter(s):  Created for Patient: Clinical Summary  Notes:   1. Prostate cancer: Considering his progression to high risk disease, I recommended he proceed with a bone scan to complete his staging. Assuming this is negative for metastases, I have recommended that he proceed with curative therapy at this time.   The patient was counseled about the natural history of prostate cancer and the standard treatment options that are available for prostate cancer. It was explained to him how his age and life expectancy, clinical stage, Gleason score, and PSA affect his prognosis, the decision to proceed with additional staging studies, as well as how that information influences recommended treatment strategies. We discussed the roles for active surveillance, radiation therapy, surgical therapy, androgen deprivation, as well as ablative therapy options for the treatment of prostate cancer as appropriate to his individual cancer  situation. We discussed the risks and benefits of these options with regard to their impact on cancer control and also in terms of potential adverse events, complications, and impact on quality of life particularly related to urinary and sexual function. The patient was encouraged to ask questions throughout the discussion today and all questions were answered to his stated satisfaction. In addition, the patient was provided with and/or directed to appropriate resources and literature for further education about prostate cancer and treatment options.   We discussed surgical therapy for prostate cancer including the different available surgical approaches. We discussed, in detail, the risks and expectations of surgery with regard to cancer control, urinary control, and erectile function as well as the expected postoperative recovery process. Additional risks of surgery including but not limited to bleeding, infection, hernia formation, nerve damage, lymphocele formation, bowel/rectal injury potentially necessitating colostomy, damage to the urinary tract resulting in urine leakage, urethral stricture, and the cardiopulmonary risks such as myocardial infarction, stroke, death, venothromboembolism, etc. were explained. The risk of open surgical conversion for robotic/laparoscopic prostatectomy was also discussed.   He is going to consider his options. He appears to be leaning toward surgical therapy both to limit his time for treatment so he can resume care for his wife and to treat his BPH symptoms at the same time as his malignancy.   My tentative plan would be to perform a BNS RAL radical prostatectomy and BPLND. He understands he would not be able to drive his wife to dialysis for 2 weeks and would to get assistance with this aspect of her care.   cc: Dr. Eda Keys  Dr. Ledon Snare  Dr. Ezekiel Slocumb           E & M CODE: I spent at least 40 minutes face to face with the patient, more than  50% of that time was spent on counseling and/or coordinating care.

## 2016-05-05 NOTE — Progress Notes (Signed)
                               Care Plan Summary  Name: Mr. Demeko Trabue DOB: March 03, 1946   Your Medical Team:   Urologist -  Dr. Raynelle Bring, [facility]  Radiation Oncologist - Dr. Tyler Pita, [facility]   Medical Oncologist - Dr. Zola Button, [facility]  Recommendations: 1) Bone Scan 2) Robotic Prostatectomy  3) Radiation with Androgen Deprivation or Radiation with     seed boost  * These recommendations are based on information available as of today's consult.      Recommendations may change depending on the results of further tests or exams.  Next Steps: 1) Dr. Lyndal Rainbow office  will schedule bone scan 2) Dr. Junious Silk will place gold markers and give Androgen Deprivation(hormone injection) 3) Dr. Johny Shears office will schedule CT simulation and radiation treatments for October.  When appointments need to be scheduled, you will be contacted by [facility] and/or [facility].  Questions?  Please do not hesitate to call Cira Rue, RN, BSN, OCN at [phone] any questions or concerns.  Shirlean Mylar is your Oncology Nurse Navigator and is available to assist you while you're receiving your medical care at [facility].

## 2016-05-05 NOTE — Progress Notes (Signed)
After clinic, the GU nurse navigator came by our office and let Dr. Tammi Klippel and I know that Michael Guzman has changed his mind and is interested in radiotherapy rather than surgery. He will return to Dr. Junious Silk to have a bone scan performed, and then for ADT injections with the intention of 2 years of therapy, as well as Gold Fiducial marker placement. Once he has had his ADT injection, we will coordinate his sim appointment 2 months thereafter.

## 2016-05-05 NOTE — Progress Notes (Signed)
Radiation Oncology         [phone] ________________________________  Initial outpatient Consultation, Multidisciplinary Prostate Clinic  Name: Michael Guzman MRN: [id number]  Date: 05/05/2016  DOB: 1945-11-21  PF:[id number],BDZHGDJ DAVID, MD  Raynelle Bring, MD   REFERRING PHYSICIAN: Raynelle Bring, MD  DIAGNOSIS: 70 y.o. gentleman with stage T1c adenocarcinoma of the prostate with a Gleason's score of 4+4 and a PSA of 7.95    ICD-9-CM ICD-10-CM   1. Malignant neoplasm of prostate ([place]) 185 C61     HISTORY OF PRESENT ILLNESS: Michael Guzman is a very pleasant 70 y.o. male seen at the request of Dr. Junious Silk. He was noted to have an elevated PSA of 5.12 by his primary care physician, Dr. Melford Aase.  Accordingly, he was referred for evaluation in urology by Dr. Junious Silk in the spring of 2016. At that time, a  digital rectal examination was performed at that time revealing no palpable nodules.  The patient proceeded to transrectal ultrasound with 12 biopsies of the prostate in February 2016.  The prostate volume measured 35 cc.  Out of 12 core biopsies, 2 were positive.  The maximum Gleason score was 3+3, and this was seen in the left base and right apex. He elected to proceed with active surveillance and has been followed with PSA checks. Of note, he did have a CT in August 2015, and this revealed heterogeneous enhancement of the prostate. A PSA was 7.1 in May 2016 and 6.1 in June 2016. He refused biopsy at that time, but would consider an MRI. His most recent PSA was 7.95 on 03/30/16. He returned on 04/16/16, this time agreeing to proceed with repeat biopsy. His exam did not reveal any palpable nodules of the prostate, and his gland size was 33.98 grams.  Final pathology revealed adenocarcinoma with a max Gleason's score of 4+4 in the right lateral apex, 4+3 in the right mid apex, as well as 3+3 in the left lateral base and left lateral apex.   The patient reviewed the biopsy results with his  urologist and he has kindly been referred today for discussion of potential radiation treatment options. Of note a CT in May 2017 did not reveal any evidence of metastatic disease. He has not had a bone scan to date. The patient also has progressive lumbar back pain with pain radiating to his lower extremities. MRI of the lumbar spine w/o contrast on 12/03/15 showed L4-L5 disc, endplate, and posterior element degeneration resulting in multifactorial mild lateral recess and spinal stenosis.The patient has a personal history of CAD and multiple stents placed, though a stress test in early August 2017 was negative for ischemia.  PREVIOUS RADIATION THERAPY: No  PAST MEDICAL HISTORY:  Past Medical History:  Diagnosis Date  . Arthritis   . CAD (coronary artery disease)   . Chronic back pain   . Chronically dry eyes   . Diabetes mellitus without complication ([place])   . Elevated PSA    being monitored  . GERD (gastroesophageal reflux disease)   . Hypertension   . Prostate cancer ([place])       PAST SURGICAL HISTORY: Past Surgical History:  Procedure Laterality Date  . APPENDECTOMY    . CARDIAC SURGERY     3 stent placed.  . COLONOSCOPY    . LEFT HEART CATHETERIZATION WITH CORONARY ANGIOGRAM N/A 06/27/2012   Procedure: LEFT HEART CATHETERIZATION WITH CORONARY ANGIOGRAM;  Surgeon: Candee Furbish, MD;  Location: [facility] CATH LAB;  Service: Cardiovascular;  Laterality: N/A;  .  LEFT HEART CATHETERIZATION WITH CORONARY ANGIOGRAM N/A 07/25/2012   Procedure: LEFT HEART CATHETERIZATION WITH CORONARY ANGIOGRAM;  Surgeon: Sueanne Margarita, MD;  Location: [facility] CATH LAB;  Service: Cardiovascular;  Laterality: N/A;  . PROSTATE BIOPSY    . RADIOLOGY WITH ANESTHESIA N/A 12/03/2015   Procedure: MRI LUMBAR SPINE;  Surgeon: Medication Radiologist, MD;  Location: [place];  Service: Radiology;  Laterality: N/A;    FAMILY HISTORY:  Family History  Problem Relation Age of Onset  . Diabetes Sister     SOCIAL HISTORY:  Social  History   Social History  . Marital status: Married    Spouse name: N/A  . Number of children: 4  . Years of education: 2 y colleg   Occupational History  . 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 Smoking status: Former Smoker    Years: 50.00    Quit date: 07/27/2011  . Smokeless tobacco: Never Used  . Alcohol use Yes     Comment: occasional  . Drug use: No  . Sexual activity: Not on file   Other Topics Concern  . Not on file   Social History Narrative   Works as     ALLERGIES: Review of patient's allergies indicates no known allergies.  MEDICATIONS:  Current Outpatient Prescriptions  Medication Sig Dispense Refill  . Artificial Tear Ointment (DRY EYES OP) Place 1 drop into both eyes daily as needed (for dry eyes).    Marland Kitchen aspirin 81 MG tablet Take 81 mg by mouth daily.    Marland Kitchen atorvastatin (LIPITOR) 40 MG tablet Take 0.5 tablets (20 mg total) by mouth daily. 45 tablet 3  . azelastine (ASTELIN) 0.1 % nasal spray Place 1 spray into both nostrils 2 (two) times daily. Use in each nostril as directed    . Blood Glucose Monitoring Suppl (ONE TOUCH ULTRA SYSTEM KIT) w/Device KIT Check blood sugar 1 time daily-DX-E11.22 1 each 0  . cetirizine (ZYRTEC) 10 MG tablet Take 1 tablet (10 mg total) by mouth daily. 30 tablet 2  . Chlorphen-PE-Acetaminophen 4-10-325 MG TABS Take 1 tablet by mouth 3 (three) times daily. 60 tablet 1  . Cholecalciferol (VITAMIN D3) 5000 UNITS CAPS Take 5,000 Units by mouth 2 (two) times daily.    . Cranberry 500 MG CAPS Take 500 mg by mouth daily.    . enalapril (VASOTEC) 2.5 MG tablet Take 1 tablet (2.5 mg total) by mouth daily. 90 tablet 3  . fluticasone (FLONASE) 50 MCG/ACT nasal spray Place 2 sprays into both nostrils daily. 16 g 0  . gabapentin (NEURONTIN) 600 MG tablet Take 1 tablet 3 x day as needed for pain 270 tablet 2  . glipiZIDE (GLUCOTROL) 5 MG tablet Take 1 tablet by mouth 3  times daily for glucose  over 200 or as directed  by  your doctor. 270 tablet 1  . glucose blood test strip Check blood sugar 1 time daily-DX-E11.22 100 each 4  . hydrocortisone (ANUSOL-HC) 25 MG suppository Place 25 mg rectally 2 (two) times daily as needed for hemorrhoids or itching.    . Magnesium 250 MG TABS Take 250 mg by mouth daily.    . metFORMIN (GLUCOPHAGE-XR) 500 MG 24 hr tablet Take 1 to 2 tablets 2 x day or as directed for Diabetes 360 tablet 1  . methocarbamol (ROBAXIN) 500 MG tablet Take 500 mg by mouth daily as needed for muscle spasms.     . metoprolol succinate (TOPROL-XL) 25 MG 24 hr tablet  Take 0.5 tablets (12.5 mg total) by mouth daily. Take with or immediately following a meal. 45 tablet 3  . montelukast (SINGULAIR) 10 MG tablet Take 1 tablet (10 mg total) by mouth at bedtime. 90 tablet 3  . Multiple Vitamins-Minerals (MULTIVITAMIN ADULT PO) Take 1 tablet by mouth daily.    . nitroGLYCERIN (NITROSTAT) 0.4 MG SL tablet Place 1 tablet (0.4 mg total) under the tongue every 5 (five) minutes as needed for chest pain. 56 tablet 2  . Omega-3 Fatty Acids (FISH OIL) 1000 MG CAPS Take 1,000 mg by mouth daily.     . ondansetron (ZOFRAN ODT) 4 MG disintegrating tablet Take 1 tablet (4 mg total) by mouth every 8 (eight) hours as needed for nausea or vomiting. 20 tablet 0  . OVER THE COUNTER MEDICATION Delsum cough medicine    . pantoprazole (PROTONIX) 40 MG tablet TAKE 1 TABLET BY MOUTH DAILY 90 tablet 3  . ranitidine (ZANTAC) 300 MG tablet Take 1 tablet (300 mg total) by mouth 2 (two) times daily. 90 tablet 1  . tamsulosin (FLOMAX) 0.4 MG CAPS capsule Take 0.4 mg by mouth at bedtime.    . empagliflozin (JARDIANCE) 25 MG TABS tablet Take 25 mg by mouth daily. (Patient not taking: Reported on 05/05/2016) 90 tablet 1   No current facility-administered medications for this encounter.     REVIEW OF SYSTEMS:  On review of systems, the patient reports that he is doing well overall. He denies any chest pain, shortness of breath, cough, fevers,  chills, night sweats, unintended weight changes. He denies any bowel disturbances, abdominal pain, nausea, or vomiting. He reports a weak urine stream, back and joint pain. His IPSS score is 10, and he describes urinary frequency, urgency, and nocturia.  A complete review of systems is obtained and is otherwise negative.    PHYSICAL EXAM:  height is 5' 9" (1.753 m) and weight is 187 lb (84.8 kg). His temperature is 98 F (36.7 C). His blood pressure is 122/85 and his pulse is 80. His oxygen saturation is 99%.   In general this is a well appearing African American male in no acute distress. He is alert and oriented x4 and appropriate throughout the examination. HEENT reveals that the patient is normocephalic, atraumatic. EOMs are intact. PERRLA. Skin is intact without any evidence of gross lesions. Cardiovascular exam reveals a regular rate and rhythm, no clicks rubs or murmurs are auscultated. Chest is clear to auscultation bilaterally. Lymphatic assessment is performed and does not reveal any adenopathy in the cervical, supraclavicular, axillary, or inguinal chains. Abdomen has active bowel sounds in all quadrants and is intact. The abdomen is soft, non tender, non distended. Lower extremities are negative for pretibial pitting edema, deep calf tenderness, cyanosis or clubbing.  KPS = 100  100 - Normal; no complaints; no evidence of disease. 90   - Able to carry on normal activity; minor signs or symptoms of disease. 80   - Normal activity with effort; some signs or symptoms of disease. 52   - Cares for self; unable to carry on normal activity or to do active work. 60   - Requires occasional assistance, but is able to care for most of his personal needs. 50   - Requires considerable assistance and frequent medical care. 70   - Disabled; requires special care and assistance. 26   - Severely disabled; hospital admission is indicated although death not imminent. 54   - Very sick; hospital admission  necessary; active  supportive treatment necessary. 10   - Moribund; fatal processes progressing rapidly. 0     - Dead  Karnofsky DA, Abelmann [place], Craver LS and Burchenal [facility] [phone]) The use of the nitrogen mustards in the palliative treatment of carcinoma: with particular reference to bronchogenic carcinoma Cancer 1 [id number]  LABORATORY DATA:  Lab Results  Component Value Date   WBC 6.7 03/10/2016   HGB 13.9 03/10/2016   HCT 41.6 03/10/2016   MCV 87.4 03/10/2016   PLT 209 03/10/2016   Lab Results  Component Value Date   NA 138 03/10/2016   K 4.4 03/10/2016   CL 101 03/10/2016   CO2 21 03/10/2016   Lab Results  Component Value Date   ALT 18 03/10/2016   AST 17 03/10/2016   ALKPHOS 72 03/10/2016   BILITOT 0.4 03/10/2016     RADIOGRAPHY: No results found.    IMPRESSION/PLAN: 1. High risk T1c, adenocarcinoma of the prostate with Gleason Score of 4+4 and a PSA of 7.95. Dr. Tammi Klippel discussed the findings from his recent biopsy, previous biopsy and imaging thus far. We did discuss the rationale for proceeding with a bone scan to rule out distant metastases. Dr. Tammi Klippel reviewed recommendations for proceeding with prostatectomy versus external radiotherapy with 2 years of ADT. He discussed the delivery and logistics of radiotherapy, and reviews the risks, benefits, short, and long term effects of treatment. After considering his options, the patient is leaning towards surgery, but would like to speak with his wife concerning his options of treatment.The patient's wife is currently receiving dialysis his treatment choice weighs heavily on the needs for transportation for his wife. We will be happy to answer questions moving forward however should he consider radiotherapy as his preferred treatment modality.  The above documentation reflects my direct findings during this shared patient visit. Please see the separate note by Dr. Tammi Klippel on this date for the remainder of the patient's plan of  care.   Carola Rhine, PAC  This document serves as a record of services personally performed by Shona Simpson, PA-C and Tyler Pita, MD. It was created on their behalf by Darcus Austin, a trained medical scribe. The creation of this record is based on the scribe's personal observations and the providers' statements to them. This document has been checked and approved by the attending provider.

## 2016-05-05 NOTE — Progress Notes (Signed)
Reason for Referral: Prostate cancer.   HPI: 70 year old gentleman with prostate cancer diagnosed in February 2016. At that time his PSA was 5.12 and underwent a biopsy at that time which showed low-grade low-volume disease. He did Gleason score 3+3 = 6 in 2 cores and elected to proceed with active surveillance at that time. Repeat biopsy obtained in August 2017 after his PSA was up to 7.95. His biopsy showed a Gleason score 4+4 = 8 in 1 core and 4+3 = 7 in another core. He had 2 cores of 3+3 = 6. Despite the high risk disease, he continued to have low volume disease in these cores. He does have lower urinary tract symptoms including low stream and required Flomax which helped his symptoms. He does report some nocturia as well but no hematuria or dysuria. He does have a history of coronary artery disease without any recent complications or issues. He was also history of diabetes and chronic back pain. Despite these issues, he remains very active and attends to activities of daily living. He also drives his wife to dialysis 3 times a week. He is able to attend to all activities of daily living.  He does not report any headaches, blurry vision, syncope or seizures. He does not report any fevers or chills or sweats. He does not report any cough, wheezing or hemoptysis. He does not report any nausea, vomiting or abdominal pain. He does not report any frequency urgency or hesitancy. He does not report any skeletal complaints. Remaining review of systems unremarkable.   Past Medical History:  Diagnosis Date  . Arthritis   . CAD (coronary artery disease)   . Chronic back pain   . Chronically dry eyes   . Diabetes mellitus without complication ([place])   . Elevated PSA    being monitored  . GERD (gastroesophageal reflux disease)   . Hypertension   . Prostate cancer [facility])   :  Past Surgical History:  Procedure Laterality Date  . APPENDECTOMY    . CARDIAC SURGERY     3 stent placed.  . COLONOSCOPY     . LEFT HEART CATHETERIZATION WITH CORONARY ANGIOGRAM N/A 06/27/2012   Procedure: LEFT HEART CATHETERIZATION WITH CORONARY ANGIOGRAM;  Surgeon: Candee Furbish, MD;  Location: [facility] CATH LAB;  Service: Cardiovascular;  Laterality: N/A;  . LEFT HEART CATHETERIZATION WITH CORONARY ANGIOGRAM N/A 07/25/2012   Procedure: LEFT HEART CATHETERIZATION WITH CORONARY ANGIOGRAM;  Surgeon: Sueanne Margarita, MD;  Location: [place] CATH LAB;  Service: Cardiovascular;  Laterality: N/A;  . PROSTATE BIOPSY    . RADIOLOGY WITH ANESTHESIA N/A 12/03/2015   Procedure: MRI LUMBAR SPINE;  Surgeon: Medication Radiologist, MD;  Location: [place];  Service: Radiology;  Laterality: N/A;  :   Current Outpatient Prescriptions:  .  Artificial Tear Ointment (DRY EYES OP), Place 1 drop into both eyes daily as needed (for dry eyes)., Disp: , Rfl:  .  aspirin 81 MG tablet, Take 81 mg by mouth daily., Disp: , Rfl:  .  atorvastatin (LIPITOR) 40 MG tablet, Take 0.5 tablets (20 mg total) by mouth daily., Disp: 45 tablet, Rfl: 3 .  azelastine (ASTELIN) 0.1 % nasal spray, Place 1 spray into both nostrils 2 (two) times daily. Use in each nostril as directed, Disp: , Rfl:  .  Blood Glucose Monitoring Suppl (ONE TOUCH ULTRA SYSTEM KIT) w/Device KIT, Check blood sugar 1 time daily-DX-E11.22, Disp: 1 each, Rfl: 0 .  cetirizine (ZYRTEC) 10 MG tablet, Take 1 tablet (10 mg total) by  mouth daily., Disp: 30 tablet, Rfl: 2 .  Chlorphen-PE-Acetaminophen 4-10-325 MG TABS, Take 1 tablet by mouth 3 (three) times daily., Disp: 60 tablet, Rfl: 1 .  Cholecalciferol (VITAMIN D3) 5000 UNITS CAPS, Take 5,000 Units by mouth 2 (two) times daily., Disp: , Rfl:  .  Cranberry 500 MG CAPS, Take 500 mg by mouth daily., Disp: , Rfl:  .  empagliflozin (JARDIANCE) 25 MG TABS tablet, Take 25 mg by mouth daily. (Patient not taking: Reported on 05/05/2016), Disp: 90 tablet, Rfl: 1 .  enalapril (VASOTEC) 2.5 MG tablet, Take 1 tablet (2.5 mg total) by mouth daily., Disp: 90 tablet, Rfl:  3 .  fluticasone (FLONASE) 50 MCG/ACT nasal spray, Place 2 sprays into both nostrils daily., Disp: 16 g, Rfl: 0 .  gabapentin (NEURONTIN) 600 MG tablet, Take 1 tablet 3 x day as needed for pain, Disp: 270 tablet, Rfl: 2 .  glipiZIDE (GLUCOTROL) 5 MG tablet, Take 1 tablet by mouth 3  times daily for glucose  over 200 or as directed by  your doctor., Disp: 270 tablet, Rfl: 1 .  glucose blood test strip, Check blood sugar 1 time daily-DX-E11.22, Disp: 100 each, Rfl: 4 .  hydrocortisone (ANUSOL-HC) 25 MG suppository, Place 25 mg rectally 2 (two) times daily as needed for hemorrhoids or itching., Disp: , Rfl:  .  Magnesium 250 MG TABS, Take 250 mg by mouth daily., Disp: , Rfl:  .  metFORMIN (GLUCOPHAGE-XR) 500 MG 24 hr tablet, Take 1 to 2 tablets 2 x day or as directed for Diabetes, Disp: 360 tablet, Rfl: 1 .  methocarbamol (ROBAXIN) 500 MG tablet, Take 500 mg by mouth daily as needed for muscle spasms. , Disp: , Rfl:  .  metoprolol succinate (TOPROL-XL) 25 MG 24 hr tablet, Take 0.5 tablets (12.5 mg total) by mouth daily. Take with or immediately following a meal., Disp: 45 tablet, Rfl: 3 .  montelukast (SINGULAIR) 10 MG tablet, Take 1 tablet (10 mg total) by mouth at bedtime., Disp: 90 tablet, Rfl: 3 .  Multiple Vitamins-Minerals (MULTIVITAMIN ADULT PO), Take 1 tablet by mouth daily., Disp: , Rfl:  .  nitroGLYCERIN (NITROSTAT) 0.4 MG SL tablet, Place 1 tablet (0.4 mg total) under the tongue every 5 (five) minutes as needed for chest pain., Disp: 56 tablet, Rfl: 2 .  Omega-3 Fatty Acids (FISH OIL) 1000 MG CAPS, Take 1,000 mg by mouth daily. , Disp: , Rfl:  .  ondansetron (ZOFRAN ODT) 4 MG disintegrating tablet, Take 1 tablet (4 mg total) by mouth every 8 (eight) hours as needed for nausea or vomiting., Disp: 20 tablet, Rfl: 0 .  OVER THE COUNTER MEDICATION, Delsum cough medicine, Disp: , Rfl:  .  pantoprazole (PROTONIX) 40 MG tablet, TAKE 1 TABLET BY MOUTH DAILY, Disp: 90 tablet, Rfl: 3 .  ranitidine  (ZANTAC) 300 MG tablet, Take 1 tablet (300 mg total) by mouth 2 (two) times daily., Disp: 90 tablet, Rfl: 1 .  tamsulosin (FLOMAX) 0.4 MG CAPS capsule, Take 0.4 mg by mouth at bedtime., Disp: , Rfl: :  No Known Allergies:  Family History  Problem Relation Age of Onset  . Diabetes Sister   :  Social History   Social History  . Marital status: Married    Spouse name: N/A  . Number of children: 4  . Years of education: 2 y colleg   Occupational History  . 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 Smoking status: Former Smoker  Years: 50.00    Quit date: 07/27/2011  . Smokeless tobacco: Never Used  . Alcohol use Yes     Comment: occasional  . Drug use: No  . Sexual activity: Not on file   Other Topics Concern  . Not on file   Social History Narrative   Works as   :  Pertinent items are noted in HPI.  Exam: ECOG 0 General appearance: alert and cooperative appeared without distress. Head: Normocephalic, without obvious abnormality Throat: oropharynx appeared moist and pink without ulcers or lesions. Neck: no adenopathy and no carotid bruit Back: negative Resp: clear to auscultation bilaterally Chest wall: no tenderness Cardio: regular rate and rhythm, S1, S2 normal, no murmur, click, rub or gallop GI: soft, non-tender; bowel sounds normal; no masses,  no organomegaly Extremities: extremities normal, atraumatic, no cyanosis or edema Pulses: 2+ and symmetric  CBC    Component Value Date/Time   WBC 6.7 03/10/2016 1456   RBC 4.76 03/10/2016 1456   HGB 13.9 03/10/2016 1456   HCT 41.6 03/10/2016 1456   PLT 209 03/10/2016 1456   MCV 87.4 03/10/2016 1456   MCH 29.2 03/10/2016 1456   MCHC 33.4 03/10/2016 1456   RDW 13.4 03/10/2016 1456   LYMPHSABS 2,613 03/10/2016 1456   MONOABS 536 03/10/2016 1456   EOSABS 402 03/10/2016 1456   BASOSABS 67 03/10/2016 1456     Chemistry      Component Value Date/Time   NA 138 03/10/2016 1456   K 4.4  03/10/2016 1456   CL 101 03/10/2016 1456   CO2 21 03/10/2016 1456   BUN 12 03/10/2016 1456   CREATININE 1.04 03/10/2016 1456      Component Value Date/Time   CALCIUM 9.8 03/10/2016 1456   ALKPHOS 72 03/10/2016 1456   AST 17 03/10/2016 1456   ALT 18 03/10/2016 1456   BILITOT 0.4 03/10/2016 1456     EXAM: CT ABDOMEN AND PELVIS WITH CONTRAST  TECHNIQUE: Multidetector CT imaging of the abdomen and pelvis was performed using the standard protocol following bolus administration of intravenous contrast.  CONTRAST:  156m ISOVUE-300 IOPAMIDOL (ISOVUE-300) INJECTION 61%  COMPARISON:  05/13/2014  FINDINGS: Atelectasis in the lung bases, likely dependent. Coronary artery calcifications and coronary artery stents.  The liver, spleen, gallbladder, pancreas, adrenal glands, abdominal aorta, inferior vena cava, and retroperitoneal lymph nodes are unremarkable. Small cysts in the kidneys. No solid mass or hydronephrosis. Renal nephrograms are symmetrical.  Stomach, small bowel, and colon are not abnormally distended small diverticulum at the second portion of the duodenum. Stool fills the colon. Distal mid abdominal small bowel loops demonstrate mild wall thickening and are fluid filled. This may indicate enteritis. No free air or free fluid in the abdomen.  Pelvis: Appendix is surgically absent. Bladder wall is not thickened. Prostate gland is not enlarged. No free or loculated pelvic fluid collections. No pelvic mass or lymphadenopathy. No destructive bone lesions.  IMPRESSION: Wall thickening of fluid-filled lower abdominal small bowel loops suggest enteritis. No evidence of diverticulitis. No evidence of bowel obstruction.    Assessment and Plan:    70year old gentleman with prostate cancer initially diagnosed in February 2016. He had low volume disease with a PSA of 5.12 and a Gleason score of 3+3 = 6. Repeat biopsy in August 2017 showed a Gleason score 4+4 = 8  still with low volume disease. His PSA was up to 7.95.  His case was discussed today in the prostate cancer multidisciplinary clinic. He had a CT scan obtained  on 01/16/2016 was also reviewed and discussed with radiology. His pathology was also discussed with the reviewing pathologist. His options of therapy were reviewed today which all include curative intent. The first operative used to ensure no advanced disease has been noted with obtaining a bone scan to complete his staging workup.  The rationale for using primary surgical therapy versus radiation therapy were reviewed today. He understands that those options are equivalent but is is favoring radiation therapy because he is the primary caregiver for his wife which is dependent on him for her transportation to dialysis. He feels that it suits his lifestyle better and feels reasonably better about that option. The role of androgen deprivation therapy for an extended period of time close to 18 months were discussed as a potential additional therapy to radiation because of his high-risk features. Complications associated with this therapy include hot flashes, weight gain and erectile dysfunction.  He will make this decision in the near future but he understands that definitive therapy is needed otherwise he will developed advanced disease which at that time his cancer is incurable.

## 2016-05-06 ENCOUNTER — Other Ambulatory Visit: Payer: Self-pay | Admitting: Urology

## 2016-05-06 DIAGNOSIS — C61 Malignant neoplasm of prostate: Secondary | ICD-10-CM

## 2016-05-06 NOTE — Progress Notes (Signed)
[place] Psychosocial Distress Screening Spiritual Care  Shadowed by counseling intern Rosana Fret, met with Michael Guzman in [facility] to introduce [facility] team/resources, reviewing distress screen per protocol.  The patient scored a 1 on the Psychosocial Distress Thermometer which indicates mild distress. Also assessed for distress and other psychosocial needs.   ONCBCN DISTRESS SCREENING 05/06/2016  Distress experienced in past week (1-10) 1  Family Problem type (No Data)  Referral to support programs Yes  Other Spiritual Care, counseling intern   Michael Guzman engaged readily in conversation about sources of meaning and purpose for him: family, mini-farm, fishing (charity bass tournaments/owns a boat), cooking (Arboriculturist), dog.  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 Much of his family lives near his homestead in the country outside of [place], including his sister Investment banker, corporate, lives next door).  He has four children, 18 grands, and now great-grands.    Per pt, one of the biggest factors influencing his choice of tx is that worries about "too much time down after surgery" because his wife, normally very independent, needs extra support after dialysis, a relatively new need in her life that arose as a complication after surgery.  Even with additional family support, being available to care for her is very important to him.  Follow up needed: No. Michael Guzman reports very little distress and significant enjoyment in retirement (albeit curtailed by recent health needs).  He is aware of ongoing Support Team availability, but please also page if needs arise/circumstances change.  Thank you.   [place], [place], [facility] Pager [phone] Voicemail [phone]

## 2016-05-07 ENCOUNTER — Ambulatory Visit (INDEPENDENT_AMBULATORY_CARE_PROVIDER_SITE_OTHER): Payer: Medicare Other | Admitting: Internal Medicine

## 2016-05-07 ENCOUNTER — Encounter: Payer: Self-pay | Admitting: Internal Medicine

## 2016-05-07 VITALS — BP 110/68 | HR 88 | Temp 98.0°F | Resp 16 | Ht 69.0 in

## 2016-05-07 DIAGNOSIS — N390 Urinary tract infection, site not specified: Secondary | ICD-10-CM | POA: Diagnosis not present

## 2016-05-07 DIAGNOSIS — R8281 Pyuria: Secondary | ICD-10-CM

## 2016-05-07 DIAGNOSIS — R1030 Lower abdominal pain, unspecified: Secondary | ICD-10-CM | POA: Diagnosis not present

## 2016-05-07 DIAGNOSIS — H65493 Other chronic nonsuppurative otitis media, bilateral: Secondary | ICD-10-CM | POA: Diagnosis not present

## 2016-05-07 MED ORDER — DOXYCYCLINE HYCLATE 100 MG PO CAPS: 100.0000 mg | ORAL_CAPSULE | Freq: Two times a day (BID) | ORAL | 0 refills | Status: DC

## 2016-05-07 NOTE — Patient Instructions (Signed)
Please stop the miralax.    Please take the amitiza once daily with breakfast in the morning.  Please start taking the norel AD three times per day.  Please start taking doxycycline twice daily with food.  This will prophylactically treat you for prostate infection.  Please talk with Dr. Junious Silk and let him know that we are starting you on this and ask if he would like for you to continue this.  He may want you to come in an see him.

## 2016-05-07 NOTE — Progress Notes (Signed)
Subjective:    Patient ID: Michael Guzman, male    DOB: [birth year], 70 y.o.   MRN: [id number]  HPI  Patient presents to the office for evaluation of nausea, and lower abdominal pain.  He reports that he did have to crawl through a window because he locked his keys in the house.  He reports that he is under a lot of stress currently due to having prostate cancer and starting treatment in the next week.  He reports that he is having some trouble with urinating.  He reports that he is having some trouble with urinating.  He is having some thick discharge with his urination.  He is also having some intermittent pain in his groin and in his testicles.  He reports that he did call Dr. Junious Silk and they told him to come here.  He did have a biopsy done last week.   He reports that he has been taking miralax daily to help with his bowel movements and he does have some abdominal cramping after having a bowel movement.  He reports that this generally goes away with eating.     Review of Systems  Constitutional: Negative for chills, fatigue and fever.  HENT: Positive for ear pain and sore throat. Negative for mouth sores, nosebleeds, postnasal drip and sinus pressure.   Respiratory: Negative for chest tightness and shortness of breath.   Cardiovascular: Negative for chest pain and palpitations.  Gastrointestinal: Positive for abdominal pain and nausea. Negative for blood in stool, constipation, diarrhea and vomiting.  Genitourinary: Positive for dysuria, frequency, testicular pain and urgency. Negative for hematuria, penile pain and scrotal swelling.       Objective:   Physical Exam  Constitutional: He is oriented to person, place, and time. He appears well-developed and well-nourished. No distress.  HENT:  Head: Normocephalic.  Mouth/Throat: Oropharynx is clear and moist. No oropharyngeal exudate.  Eyes: Conjunctivae are normal. No scleral icterus.  Neck: Normal range of motion. Neck supple. No  JVD present. No thyromegaly present.  Cardiovascular: Normal rate, regular rhythm, normal heart sounds and intact distal pulses.  Exam reveals no gallop and no friction rub.   No murmur heard. Pulmonary/Chest: Effort normal and breath sounds normal. No respiratory distress. He has no wheezes. He has no rales. He exhibits no tenderness.  Abdominal: Soft. Normal appearance and bowel sounds are normal. He exhibits no distension and no mass. There is tenderness in the right lower quadrant, suprapubic area and left lower quadrant. There is no rigidity, no rebound, no guarding, no CVA tenderness, no tenderness at McBurney's point and negative Murphy's sign.  Genitourinary:  Genitourinary Comments: Patient declined genital exam and prostate exam.  Would prefer for doctor eskridge to perform this  Musculoskeletal: Normal range of motion.  Lymphadenopathy:    He has no cervical adenopathy.  Neurological: He is alert and oriented to person, place, and time.  Skin: Skin is warm and dry. He is not diaphoretic.  Psychiatric: He has a normal mood and affect. His behavior is normal. Judgment and thought content normal.  Nursing note and vitals reviewed.   Vitals:   05/07/16 1123  BP: 110/68  Pulse: 88  Resp: 16  Temp: 98 F (36.7 C)          Assessment & Plan:    1. Pyuria -possible prostatitis vs. epidydimitis vs UTI -doxycycline -patient to get in touch with Dr. Junious Silk and possible go in to the office - Urinalysis, Routine w reflex microscopic (not  at [facility]) - Culture, Urine  2.  Abdominal pain -stop miralax -try 78mcg amitiza samples to see if abdominal cramping due to osmotic effects of the miralax  3.  Ear pain -cont allergy meds -norel AD given

## 2016-05-08 LAB — URINALYSIS, ROUTINE W REFLEX MICROSCOPIC
Bilirubin Urine: NEGATIVE
Ketones, ur: NEGATIVE
LEUKOCYTES UA: NEGATIVE
NITRITE: NEGATIVE
PROTEIN: NEGATIVE
Specific Gravity, Urine: 1.036 — ABNORMAL HIGH (ref 1.001–1.035)
pH: 5 (ref 5.0–8.0)

## 2016-05-08 LAB — URINALYSIS, MICROSCOPIC ONLY
Bacteria, UA: NONE SEEN [HPF]
Casts: NONE SEEN [LPF]
Crystals: NONE SEEN [HPF]
RBC / HPF: NONE SEEN RBC/HPF (ref <=2)
SQUAMOUS EPITHELIAL / LPF: NONE SEEN [HPF] (ref <=5)
WBC UA: NONE SEEN WBC/HPF (ref <=5)
Yeast: NONE SEEN [HPF]

## 2016-05-08 LAB — URINE CULTURE: Organism ID, Bacteria: NO GROWTH

## 2016-05-11 NOTE — Addendum Note (Signed)
Encounter addended by: Benn Moulder, RN on: 05/11/2016  8:58 AM<BR>    Actions taken: Charge Capture section accepted

## 2016-05-12 ENCOUNTER — Encounter: Payer: Self-pay | Admitting: Internal Medicine

## 2016-05-12 ENCOUNTER — Ambulatory Visit (INDEPENDENT_AMBULATORY_CARE_PROVIDER_SITE_OTHER): Payer: Medicare Other | Admitting: Internal Medicine

## 2016-05-12 DIAGNOSIS — E069 Thyroiditis, unspecified: Secondary | ICD-10-CM | POA: Diagnosis not present

## 2016-05-12 DIAGNOSIS — R7989 Other specified abnormal findings of blood chemistry: Secondary | ICD-10-CM | POA: Diagnosis not present

## 2016-05-12 LAB — T3, FREE: T3, Free: 2.9 pg/mL (ref 2.3–4.2)

## 2016-05-12 LAB — TSH: TSH: 0.29 u[IU]/mL — AB (ref 0.35–4.50)

## 2016-05-12 LAB — T4, FREE: Free T4: 0.6 ng/dL (ref 0.60–1.60)

## 2016-05-12 NOTE — Progress Notes (Signed)
Patient ID: Michael Guzman, male   DOB: [birth year], 70 y.o.   MRN: [id number]    HPI  Michael Guzman is a 70 y.o.-year-old male, referred by his PCP, Dr. Melford Aase, for evaluation for low TSH and thyroiditis.  Pt has had a low TSH for years and a new thyroid uptake and scan (03/27/2016) returned with uniform scan and suppressed uptake: Slightly decreased 24 hour radio iodine uptake at 8.4%.  Previous Uptake and scan studies: 09/02/2102:  1.  Low normal (11%) 24 hour radioiodine uptake by the thyroid gland. Uptake on the prior studies was 31% and 27%. 2.  Stable scan with fairly homogeneous activity.  No focal hot nodules identified. 04/08/2012: The thyroid scan demonstrates fairly homogeneous uptake in the thyroid gland.  A pyramidal lobe is again demonstrated.  No hot or cold lesions are identified.  Normal uptake in the salivary glands.The 24 hour I-131 uptake was calculated at 26.6%.  Normal is 10-35%. 10/27/2011: The 24 hour radioactive iodine uptake is upper limits of normal at 31.1%.  These findings may represent early Graves' disease or subclinical hyperthyroidism. Radiotracer uptake within the pyramidal lobe is noted. 07/11/2010: 24 are uptake remains within normal range of 18.9%.  The pertechnetate scan remains normal and unchanged in appearance. 04/26/2009: 24 hour I 131 uptake = 12 % (normal 10-30%). Essentially normal thyroid imaging and 24 prior uptake.  With a slightly depressed TSH, consider subacute thyroiditis or potentially early Grave's disease.  I reviewed pt's thyroid tests: Lab Results  Component Value Date   TSH 0.39 (L) 03/10/2016   TSH 0.26 (L) 12/04/2015   TSH 0.501 09/05/2015   TSH 0.187 (L) 05/09/2015   TSH 0.146 (L) 01/31/2015   TSH 0.321 (L) 10/22/2014   TSH 0.222 (L) 07/24/2014   TSH 0.248 (L) 03/22/2014   TSH 0.194 (L) 11/13/2013   TSH 0.209 (L) 07/19/2013   FREET4 1.02 06/26/2012     Pt denies feeling nodules in neck, dysphagia/odynophagia,  SOB with lying down, but does have hoarseness; he c/o: - + fatigue - no recent weight loss, in 2010: lost ~20 lbs unintentional - no excessive sweating/heat intolerance - no tremors - no anxiety - no palpitations - no hyperdefecation  Pt was adopted >> unclear if FH of thyroid ds. ? FH of thyroid cancer. No h/o radiation tx to head or neck.  No seaweed or kelp, no recent contrast studies (last . + steroid use >> last taper finished 3 weeks ago. He had injections in back - last 2 mo ago. No herbal supplements. No Biotin use.  Pt. also has a history of Prostate CA, DM2, HTN, GERD.  ROS: Constitutional: + see HPI, + nocturia Eyes: no blurry vision, no xerophthalmia ENT: no sore throat, no nodules palpated in throat, no dysphagia/odynophagia, but has hoarseness Cardiovascular: no CP/SOB/palpitations/leg swelling Respiratory: no cough/SOB Gastrointestinal: no N/V/D/C Musculoskeletal: no muscle/+ joint aches Skin: no rashes Neurological: no tremors/numbness/tingling/dizziness Psychiatric: no depression/anxiety  Past Medical History:  Diagnosis Date  . Arthritis   . CAD (coronary artery disease)   . Chronic back pain   . Chronically dry eyes   . Diabetes mellitus without complication ([place])   . Elevated PSA    being monitored  . GERD (gastroesophageal reflux disease)   . Hypertension   . Prostate cancer [facility])    Past Surgical History:  Procedure Laterality Date  . APPENDECTOMY    . CARDIAC SURGERY     3 stent placed.  . COLONOSCOPY    .  LEFT HEART CATHETERIZATION WITH CORONARY ANGIOGRAM N/A 06/27/2012   Procedure: LEFT HEART CATHETERIZATION WITH CORONARY ANGIOGRAM;  Surgeon: Candee Furbish, MD;  Location: [facility] CATH LAB;  Service: Cardiovascular;  Laterality: N/A;  . LEFT HEART CATHETERIZATION WITH CORONARY ANGIOGRAM N/A 07/25/2012   Procedure: LEFT HEART CATHETERIZATION WITH CORONARY ANGIOGRAM;  Surgeon: Sueanne Margarita, MD;  Location: [place] CATH LAB;  Service: Cardiovascular;   Laterality: N/A;  . PROSTATE BIOPSY    . RADIOLOGY WITH ANESTHESIA N/A 12/03/2015   Procedure: MRI LUMBAR SPINE;  Surgeon: Medication Radiologist, MD;  Location: [place];  Service: Radiology;  Laterality: N/A;   Social History   Social History  . Marital status: Married    Spouse name: N/A  . Number of children: 4  . Years of education: 2 y colleg   Occupational History  . 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 Smoking status: Former Smoker    Years: 50.00    Quit date: 07/27/2011  . Smokeless tobacco: Never Used  . Alcohol use Yes     Comment: occasional  . Drug use: No   Current Outpatient Prescriptions on File Prior to Visit  Medication Sig Dispense Refill  . Artificial Tear Ointment (DRY EYES OP) Place 1 drop into both eyes daily as needed (for dry eyes).    Marland Kitchen aspirin 81 MG tablet Take 81 mg by mouth daily.    Marland Kitchen atorvastatin (LIPITOR) 40 MG tablet Take 0.5 tablets (20 mg total) by mouth daily. 45 tablet 3  . azelastine (ASTELIN) 0.1 % nasal spray Place 1 spray into both nostrils 2 (two) times daily. Use in each nostril as directed    . Blood Glucose Monitoring Suppl (ONE TOUCH ULTRA SYSTEM KIT) w/Device KIT Check blood sugar 1 time daily-DX-E11.22 1 each 0  . cetirizine (ZYRTEC) 10 MG tablet Take 1 tablet (10 mg total) by mouth daily. 30 tablet 2  . Chlorphen-PE-Acetaminophen 4-10-325 MG TABS Take 1 tablet by mouth 3 (three) times daily. 60 tablet 1  . Cholecalciferol (VITAMIN D3) 5000 UNITS CAPS Take 5,000 Units by mouth 2 (two) times daily.    . Cranberry 500 MG CAPS Take 500 mg by mouth daily.    . empagliflozin (JARDIANCE) 25 MG TABS tablet Take 25 mg by mouth daily. 90 tablet 1  . enalapril (VASOTEC) 2.5 MG tablet Take 1 tablet (2.5 mg total) by mouth daily. 90 tablet 3  . fluticasone (FLONASE) 50 MCG/ACT nasal spray Place 2 sprays into both nostrils daily. 16 g 0  . gabapentin (NEURONTIN) 600 MG tablet Take 1 tablet 3 x day as needed for pain 270  tablet 2  . glipiZIDE (GLUCOTROL) 5 MG tablet Take 1 tablet by mouth 3  times daily for glucose  over 200 or as directed by  your doctor. 270 tablet 1  . glucose blood test strip Check blood sugar 1 time daily-DX-E11.22 100 each 4  . hydrocortisone (ANUSOL-HC) 25 MG suppository Place 25 mg rectally 2 (two) times daily as needed for hemorrhoids or itching.    . Magnesium 250 MG TABS Take 250 mg by mouth daily.    . metFORMIN (GLUCOPHAGE-XR) 500 MG 24 hr tablet Take 1 to 2 tablets 2 x day or as directed for Diabetes 360 tablet 1  . methocarbamol (ROBAXIN) 500 MG tablet Take 500 mg by mouth daily as needed for muscle spasms.     . metoprolol succinate (TOPROL-XL) 25 MG 24 hr tablet Take 0.5 tablets (12.5 mg total)  by mouth daily. Take with or immediately following a meal. 45 tablet 3  . montelukast (SINGULAIR) 10 MG tablet Take 1 tablet (10 mg total) by mouth at bedtime. 90 tablet 3  . Multiple Vitamins-Minerals (MULTIVITAMIN ADULT PO) Take 1 tablet by mouth daily.    . nitroGLYCERIN (NITROSTAT) 0.4 MG SL tablet Place 1 tablet (0.4 mg total) under the tongue every 5 (five) minutes as needed for chest pain. 56 tablet 2  . Omega-3 Fatty Acids (FISH OIL) 1000 MG CAPS Take 1,000 mg by mouth daily.     . ondansetron (ZOFRAN ODT) 4 MG disintegrating tablet Take 1 tablet (4 mg total) by mouth every 8 (eight) hours as needed for nausea or vomiting. 20 tablet 0  . OVER THE COUNTER MEDICATION Delsum cough medicine    . pantoprazole (PROTONIX) 40 MG tablet TAKE 1 TABLET BY MOUTH DAILY 90 tablet 3  . ranitidine (ZANTAC) 300 MG tablet Take 1 tablet (300 mg total) by mouth 2 (two) times daily. 90 tablet 1  . tamsulosin (FLOMAX) 0.4 MG CAPS capsule Take 0.4 mg by mouth at bedtime.    Marland Kitchen doxycycline (VIBRAMYCIN) 100 MG capsule Take 1 capsule (100 mg total) by mouth 2 (two) times daily. One po bid x 30 days (Patient not taking: Reported on 05/12/2016) 60 capsule 0   No current facility-administered medications on file  prior to visit.    No Known Allergies Family History  Problem Relation Age of Onset  . Diabetes Sister    PE: BP 110/70 (BP Location: Left Arm, Patient Position: Sitting)   Pulse 83   Ht 5' 8"  (1.727 m)   Wt 188 lb (85.3 kg)   SpO2 95%   BMI 28.59 kg/m  Wt Readings from Last 3 Encounters:  05/12/16 188 lb (85.3 kg)  05/05/16 187 lb (84.8 kg)  04/23/16 185 lb (83.9 kg)   Constitutional: overweight, in NAD Eyes: PERRLA, EOMI, no exophthalmos, no lid lag, no stare ENT: moist mucous membranes, no thyromegaly, no thyroid bruits, no cervical lymphadenopathy Cardiovascular: RRR, No MRG Respiratory: CTA B Gastrointestinal: abdomen soft, NT, ND, BS+ Musculoskeletal: no deformities, strength intact in all 4 Skin: moist, warm, no rashes Neurological: no tremor with outstretched hands, DTR normal in all 4  ASSESSMENT: 1. Low TSH  2. Thyroiditis  PLAN:  1. Patient with a recently found low TSH, with initial thyrotoxic sxs: weight loss - in 2010, now with stable weight and w/o other poss. Hyperthyroid sxs. His TSH has fluctuated in the slightly low range since ~2010. He has had several uptake and scan tests over the years with diverse results: c/w normal, Graves ds., thyroiditis (?). We did discuss about possible causes for his low TSH: - he does not appear to have exogenous causes for the low TSH.  - We discussed that possible causes of thyrotoxicosis are:  Graves ds   Thyroiditis toxic multinodular goiter/ toxic adenoma (I cannot feel nodules at palpation of his thyroid and the thyroid scan does not suggest this) - I suggested that we check the TSH, fT3 and fT4 and also add TPO and ATA antibodies to check for Hashimoto's thyroiditis and also check thyroid stimulating antibodies to screen for Graves' disease.  - we discussed about possible modalities of treatment for the above conditions, to include methimazole use, radioactive iodine ablation or (last resort) surgery. However, due to  the mild degree of TSH abnormality, lack of sxs and the discrepancy b/w uptake and scan reports, I suggested to just follow  him w/o tx. He agrees. Also, I would not suggest to repeat the Uptake and scans from now on. - I do not feel that we need to add beta blockers at this time, since he is not tachycardic, anxious, or tremulous - RTC in 1 year, but in 6 mo for repeat labs  2. Thyroiditis - Per decreased iodine uptake/homogeneous scan - However, the last uptake and scan was performed 2 months after a CT scan with contrast, which could have influence the results.  - We decided to check his TFTs again today along with thyroid antibodies  Orders Placed This Encounter  Procedures  . T4, free  . T3, free  . TSH  . Thyroglobulin antibody  . Thyroid peroxidase antibody  . Thyroid Stimulating Immunoglobulin   Office Visit on 05/12/2016  Component Date Value Ref Range Status  . Free T4 05/12/2016 0.60  0.60 - 1.60 ng/dL Final  . T3, Free 05/12/2016 2.9  2.3 - 4.2 pg/mL Final  . TSH 05/12/2016 0.29* 0.35 - 4.50 uIU/mL Final  . Thyroglobulin Ab 05/13/2016 <1  <2 IU/mL Final  . Thyroperoxidase Ab SerPl-aCnc 05/13/2016 1  <9 IU/mL Final  . TSI 05/17/2016 <89  <140 % baseline Final   Comment: Thyroid stimulating immunoglobulins (TSI) can engage the TSH receptors resulting in hyperthyroidism in Graves' disease patients. TSI levels can be useful in monitoring the clinical outcome of Graves' disease as well as assessing the potential for hyperthyroidism from maternal-fetal transfer. TSI results greater than or equal to (>=) 140% of the Reference Control are considered positive. NOTE: A serum TSH level greater than 350 micro-International Units/mL can interfere with the TSI bioassay and potentially give false positive results. Patients who are pregnant and are suspected of having hyperthyroidism should have both TSI and human Chorionic Gonadotropin(hCG) tests measured. A serum hCG level  greater than 40,625 mIU/mL can interfere with the TSI bioassay and may give false negative results. In these patients it is recommended that a second TSI be obtained when the hCG concentration falls below 40,625 mIU/mL (usually after approximately 20-weeks gestation). The an                          alytical performance characteristics of this assay have been determined by [employer], [address], [place]. The modifications have not been cleared or approved by the FDA. This assay has been validated pursuant to the CLIA regulations and is used for clinical purposes.    Thyroid antibodies are not elevated.  TSH is mildly low, with normal free T4 and free T3. I would not recommend any intervention right now.  Philemon Kingdom, MD PhD [facility]

## 2016-05-12 NOTE — Patient Instructions (Addendum)
Please stop at the lab.  Please return in 1 year, but come back for labs in 6 months.

## 2016-05-13 LAB — THYROID PEROXIDASE ANTIBODY: Thyroperoxidase Ab SerPl-aCnc: 1 IU/mL (ref <9)

## 2016-05-13 LAB — THYROGLOBULIN ANTIBODY

## 2016-05-14 ENCOUNTER — Encounter (HOSPITAL_COMMUNITY)
Admission: RE | Admit: 2016-05-14 | Discharge: 2016-05-14 | Disposition: A | Discharge status: Home / Self Care | Payer: Medicare Other | Source: Ambulatory Visit | Attending: Urology | Admitting: Urology

## 2016-05-14 DIAGNOSIS — C61 Malignant neoplasm of prostate: Secondary | ICD-10-CM | POA: Diagnosis not present

## 2016-05-14 MED ORDER — TECHNETIUM TC 99M MEDRONATE IV KIT
21.6000 | PACK | Freq: Once | INTRAVENOUS | Status: AC | PRN
  Administered 2016-05-14: 21.6 via INTRAVENOUS

## 2016-05-17 LAB — THYROID STIMULATING IMMUNOGLOBULIN: TSI: <89 % baseline (ref <140)

## 2016-05-18 ENCOUNTER — Encounter: Payer: Self-pay | Admitting: *Deleted

## 2016-05-25 ENCOUNTER — Encounter: Payer: Self-pay | Admitting: Internal Medicine

## 2016-05-25 ENCOUNTER — Other Ambulatory Visit: Payer: Self-pay | Admitting: Internal Medicine

## 2016-05-25 MED ORDER — LUBIPROSTONE 24 MCG PO CAPS: 24.0000 ug | ORAL_CAPSULE | Freq: Every day | ORAL | 0 refills | Status: DC

## 2016-05-28 DIAGNOSIS — M1611 Unilateral primary osteoarthritis, right hip: Secondary | ICD-10-CM | POA: Diagnosis not present

## 2016-06-05 ENCOUNTER — Telehealth: Payer: Self-pay | Admitting: *Deleted

## 2016-06-05 ENCOUNTER — Encounter: Payer: Self-pay | Admitting: Medical Oncology

## 2016-06-05 DIAGNOSIS — C61 Malignant neoplasm of prostate: Secondary | ICD-10-CM | POA: Diagnosis not present

## 2016-06-05 NOTE — Telephone Encounter (Signed)
CALLED PATIENT TO INFORM OF SIM ON 06-12-16 @ 1 PM @ DR. MANNING'S OFFICE, (PT. IS AWARE OF THIS APPT.), GOLD SEEDS BEING PLACED ON 06-05-16 @ [facility]

## 2016-06-11 ENCOUNTER — Ambulatory Visit: Payer: Self-pay | Admitting: Internal Medicine

## 2016-06-12 ENCOUNTER — Ambulatory Visit: Admission: RE | Admit: 2016-06-12 | Payer: Medicare Other | Source: Ambulatory Visit | Admitting: Radiation Oncology

## 2016-06-12 NOTE — Progress Notes (Signed)
Spoke with Michael Guzman post Prostate MDC. He received his gold markers and his first Lupron injeciton 06/05/16. He was scheduled for CT sim today but it was rescheduled for 12/01 due to Lupron injection date. We discussed the side effects of the Lupron and radiation treatments. He had questions about scheduling times when the radiation starts. I explained at [place] he will be asked what time of day will be best for him. His wife goes to dialysis 3 times a week and this is a concern.. I assured him we will work with him. He voiced understanding. He has my number and I asked him to call me with any questions or concerns about the radiation. He voiced understanding.

## 2016-06-18 ENCOUNTER — Other Ambulatory Visit: Payer: Self-pay | Admitting: Internal Medicine

## 2016-06-18 DIAGNOSIS — E119 Type 2 diabetes mellitus without complications: Secondary | ICD-10-CM

## 2016-06-22 DIAGNOSIS — M5416 Radiculopathy, lumbar region: Secondary | ICD-10-CM | POA: Diagnosis not present

## 2016-06-23 ENCOUNTER — Ambulatory Visit (INDEPENDENT_AMBULATORY_CARE_PROVIDER_SITE_OTHER): Payer: Medicare Other | Admitting: Internal Medicine

## 2016-06-23 ENCOUNTER — Encounter: Payer: Self-pay | Admitting: Internal Medicine

## 2016-06-23 VITALS — BP 116/70 | HR 84 | Temp 98.4°F | Resp 16 | Ht 68.0 in | Wt 188.0 lb

## 2016-06-23 DIAGNOSIS — E1122 Type 2 diabetes mellitus with diabetic chronic kidney disease: Secondary | ICD-10-CM | POA: Diagnosis not present

## 2016-06-23 DIAGNOSIS — R7989 Other specified abnormal findings of blood chemistry: Secondary | ICD-10-CM

## 2016-06-23 DIAGNOSIS — I10 Essential (primary) hypertension: Secondary | ICD-10-CM | POA: Diagnosis not present

## 2016-06-23 DIAGNOSIS — N182 Chronic kidney disease, stage 2 (mild): Secondary | ICD-10-CM | POA: Diagnosis not present

## 2016-06-23 DIAGNOSIS — E559 Vitamin D deficiency, unspecified: Secondary | ICD-10-CM

## 2016-06-23 DIAGNOSIS — C61 Malignant neoplasm of prostate: Secondary | ICD-10-CM

## 2016-06-23 DIAGNOSIS — Z79899 Other long term (current) drug therapy: Secondary | ICD-10-CM

## 2016-06-23 DIAGNOSIS — E782 Mixed hyperlipidemia: Secondary | ICD-10-CM | POA: Diagnosis not present

## 2016-06-23 DIAGNOSIS — E1142 Type 2 diabetes mellitus with diabetic polyneuropathy: Secondary | ICD-10-CM | POA: Diagnosis not present

## 2016-06-23 DIAGNOSIS — Z23 Encounter for immunization: Secondary | ICD-10-CM | POA: Diagnosis not present

## 2016-06-23 MED ORDER — PREDNISONE 20 MG PO TABS
ORAL_TABLET | ORAL | 0 refills | Status: DC
Start: 2016-06-23 — End: 2016-10-20

## 2016-06-23 NOTE — Progress Notes (Signed)
Assessment and Plan:  Hypertension:  -Continue medication -monitor blood pressure at home. -Continue DASH diet -Reminder to go to the ER if any CP, SOB, nausea, dizziness, severe HA, changes vision/speech, left arm numbness and tingling and jaw pain.  Cholesterol - Continue diet and exercise -Check cholesterol.   Diabetes with diabetic chronic kidney disease and neuropathy -Continue diet and exercise.  -Check A1C  Vitamin D Def -check level -continue medications.   Prostate cancer -lupron injections -radiation pending  Neck pain -likely sternocleidomastoid strain secondary turning head while doing yard work -tylenol TID -robaxin prn -heat  Continue diet and meds as discussed. Further disposition pending results of labs. Discussed med's effects and SE's.    HPI 70 y.o. male  presents for 3 month follow up with hypertension, hyperlipidemia, diabetes and vitamin D deficiency.   His blood pressure has been controlled at home, today their BP is BP: 116/70.He does not workout. He denies chest pain, shortness of breath, dizziness.   He is on cholesterol medication and denies myalgias. His cholesterol is at goal. The cholesterol was:  03/10/2016: Cholesterol 135; HDL 45; LDL Cholesterol NOT CALC; Triglycerides 459   He has been working on diet and exercise for diabetes with diabetic chronic kidney disease, he is on bASA, he is on ACE/ARB, and denies  foot ulcerations, hyperglycemia, hypoglycemia , increased appetite, nausea, paresthesia of the feet, polydipsia, polyuria, visual disturbances, vomiting and weight loss. Last A1C was: 03/10/2016: Hgb A1c MFr Bld 8.3.  He reports that he is taking the jardiance.  He is checking blood sugars and they are generally around 150.  He feels like he is thirsty.     Patient is on Vitamin D supplement. 03/10/2016: Vit D, 25-Hydroxy 79  He has been having lupron injections prior to his radiation therapy secondary to his prostate cancer.  He  reports that his neck has been really bothering him.  He did some yard work and then his neck started really hurting.  He has been taking showers twice daily.  He reports that the heat does help a little bit. He has taken a single dose of robaxin.  He feels like this didn't help much.  He reports that the tylenol arthritis helps the most.  He only takes 2 per day.      Current Medications:  Current Outpatient Prescriptions on File Prior to Visit  Medication Sig Dispense Refill  . Artificial Tear Ointment (DRY EYES OP) Place 1 drop into both eyes daily as needed (for dry eyes).    Marland Kitchen aspirin 81 MG tablet Take 81 mg by mouth daily.    Marland Kitchen atorvastatin (LIPITOR) 40 MG tablet Take 0.5 tablets (20 mg total) by mouth daily. 45 tablet 3  . azelastine (ASTELIN) 0.1 % nasal spray Place 1 spray into both nostrils 2 (two) times daily. Use in each nostril as directed    . Blood Glucose Monitoring Suppl (ONE TOUCH ULTRA SYSTEM KIT) w/Device KIT Check blood sugar 1 time daily-DX-E11.22 1 each 0  . cetirizine (ZYRTEC) 10 MG tablet Take 1 tablet (10 mg total) by mouth daily. 30 tablet 2  . Cholecalciferol (VITAMIN D3) 5000 UNITS CAPS Take 5,000 Units by mouth 2 (two) times daily.    . Cranberry 500 MG CAPS Take 500 mg by mouth daily.    . empagliflozin (JARDIANCE) 25 MG TABS tablet Take 25 mg by mouth daily. 90 tablet 1  . enalapril (VASOTEC) 2.5 MG tablet Take 1 tablet (2.5 mg total) by mouth daily.  90 tablet 3  . fluticasone (FLONASE) 50 MCG/ACT nasal spray Place 2 sprays into both nostrils daily. 16 g 0  . gabapentin (NEURONTIN) 600 MG tablet Take 1 tablet 3 x day as needed for pain 270 tablet 2  . glipiZIDE (GLUCOTROL) 5 MG tablet Take 1 tablet by mouth 3  times daily for glucose  over 200 or as directed by  your doctor. 270 tablet 1  . glucose blood test strip Check blood sugar 1 time daily-DX-E11.22 100 each 4  . hydrocortisone (ANUSOL-HC) 25 MG suppository Place 25 mg rectally 2 (two) times daily as needed  for hemorrhoids or itching.    . lubiprostone (AMITIZA) 24 MCG capsule Take 1 capsule (24 mcg total) by mouth daily with breakfast. 90 capsule 0  . Magnesium 250 MG TABS Take 250 mg by mouth daily.    . metFORMIN (GLUCOPHAGE-XR) 500 MG 24 hr tablet TAKE 1 TO 2 TABLETS BY  MOUTH TWO TIMES DAILY AS  DIRECTED FOR DIABETES 360 tablet 0  . methocarbamol (ROBAXIN) 500 MG tablet Take 500 mg by mouth daily as needed for muscle spasms.     . metoprolol succinate (TOPROL-XL) 25 MG 24 hr tablet Take 0.5 tablets (12.5 mg total) by mouth daily. Take with or immediately following a meal. 45 tablet 3  . montelukast (SINGULAIR) 10 MG tablet Take 1 tablet (10 mg total) by mouth at bedtime. 90 tablet 3  . Multiple Vitamins-Minerals (MULTIVITAMIN ADULT PO) Take 1 tablet by mouth daily.    . nitroGLYCERIN (NITROSTAT) 0.4 MG SL tablet Place 1 tablet (0.4 mg total) under the tongue every 5 (five) minutes as needed for chest pain. 56 tablet 2  . Omega-3 Fatty Acids (FISH OIL) 1000 MG CAPS Take 1,000 mg by mouth daily.     . ondansetron (ZOFRAN ODT) 4 MG disintegrating tablet Take 1 tablet (4 mg total) by mouth every 8 (eight) hours as needed for nausea or vomiting. 20 tablet 0  . OVER THE COUNTER MEDICATION Delsum cough medicine    . pantoprazole (PROTONIX) 40 MG tablet TAKE 1 TABLET BY MOUTH DAILY 90 tablet 3  . ranitidine (ZANTAC) 300 MG tablet Take 1 tablet (300 mg total) by mouth 2 (two) times daily. 90 tablet 1  . tamsulosin (FLOMAX) 0.4 MG CAPS capsule Take 0.4 mg by mouth at bedtime.     No current facility-administered medications on file prior to visit.    Medical History:  Past Medical History:  Diagnosis Date  . Arthritis   . CAD (coronary artery disease)   . Chronic back pain   . Chronically dry eyes   . Diabetes mellitus without complication (HCC)   . Elevated PSA    being monitored  . GERD (gastroesophageal reflux disease)   . Hypertension   . Prostate cancer (HCC)    Allergies: No Known  Allergies   Review of Systems:  Review of Systems  Constitutional: Positive for diaphoresis. Negative for fever, malaise/fatigue and weight loss.  Respiratory: Negative for cough, shortness of breath and wheezing.   Cardiovascular: Negative for chest pain, orthopnea, claudication and leg swelling.  Gastrointestinal: Negative for abdominal pain, blood in stool, constipation, diarrhea, heartburn, melena and vomiting.  Genitourinary: Negative for dysuria, flank pain, frequency, hematuria and urgency.  Musculoskeletal: Negative for back pain, falls, joint pain, myalgias and neck pain.  Neurological: Negative for dizziness, tremors, sensory change, speech change, focal weakness, seizures and loss of consciousness.    Family history- Review and unchanged  Social history-  Review and unchanged  Physical Exam: BP 116/70   Pulse 84   Temp 98.4 F (36.9 C) (Temporal)   Resp 16   Ht 5' 8"  (1.727 m)   Wt 188 lb (85.3 kg)   BMI 28.59 kg/m  Wt Readings from Last 3 Encounters:  06/23/16 188 lb (85.3 kg)  05/12/16 188 lb (85.3 kg)  05/05/16 187 lb (84.8 kg)   General Appearance: Well nourished well developed, non-toxic appearing, in no apparent distress. Eyes: PERRLA, EOMs, conjunctiva no swelling or erythema ENT/Mouth: Ear canals clear with no erythema, swelling, or discharge.  TMs normal bilaterally, oropharynx clear, moist, with no exudate.   Neck: Supple, thyroid normal, no JVD, no cervical adenopathy.  Respiratory: Respiratory effort normal, breath sounds clear A&P, no wheeze, rhonchi or rales noted.  No retractions, no accessory muscle usage Cardio: RRR with no MRGs. No noted edema.  Abdomen: Soft, + BS.  Non tender, no guarding, rebound, hernias, masses. Musculoskeletal: Full ROM, 5/5 strength, Normal gait Skin: Warm, dry without rashes, lesions, ecchymosis.  Neuro: Awake and oriented X 3, Cranial nerves intact. No cerebellar symptoms.  Psych: normal affect, Insight and Judgment  appropriate.    Starlyn Skeans, PA-C 3:58 PM [facility]

## 2016-06-24 LAB — CBC WITH DIFFERENTIAL/PLATELET
BASOS PCT: 1 %
Basophils Absolute: 67 cells/uL (ref 0–200)
Eosinophils Absolute: 402 cells/uL (ref 15–500)
Eosinophils Relative: 6 %
HEMATOCRIT: 43 % (ref 38.5–50.0)
HEMOGLOBIN: 14.1 g/dL (ref 13.2–17.1)
LYMPHS ABS: 2546 {cells}/uL (ref 850–3900)
Lymphocytes Relative: 38 %
MCH: 28.4 pg (ref 27.0–33.0)
MCHC: 32.8 g/dL (ref 32.0–36.0)
MCV: 86.7 fL (ref 80.0–100.0)
MONO ABS: 603 {cells}/uL (ref 200–950)
MPV: 10.7 fL (ref 7.5–12.5)
Monocytes Relative: 9 %
NEUTROS ABS: 3082 {cells}/uL (ref 1500–7800)
Neutrophils Relative %: 46 %
Platelets: 227 10*3/uL (ref 140–400)
RBC: 4.96 MIL/uL (ref 4.20–5.80)
RDW: 14.7 % (ref 11.0–15.0)
WBC: 6.7 10*3/uL (ref 3.8–10.8)

## 2016-06-24 LAB — LIPID PANEL
CHOLESTEROL: 131 mg/dL (ref 125–200)
HDL: 51 mg/dL (ref >=40)
LDL Cholesterol: 21 mg/dL (ref <130)
TRIGLYCERIDES: 295 mg/dL — AB (ref <150)
Total CHOL/HDL Ratio: 2.6 Ratio (ref <=5.0)
VLDL: 59 mg/dL — AB (ref <30)

## 2016-06-24 LAB — BASIC METABOLIC PANEL WITH GFR
BUN: 16 mg/dL (ref 7–25)
CALCIUM: 9.5 mg/dL (ref 8.6–10.3)
CO2: 22 mmol/L (ref 20–31)
CREATININE: 1.16 mg/dL (ref 0.70–1.25)
Chloride: 106 mmol/L (ref 98–110)
GFR, EST AFRICAN AMERICAN: 74 mL/min (ref >=60)
GFR, EST NON AFRICAN AMERICAN: 64 mL/min (ref >=60)
Glucose, Bld: 168 mg/dL — ABNORMAL HIGH (ref 65–99)
Potassium: 4.2 mmol/L (ref 3.5–5.3)
SODIUM: 140 mmol/L (ref 135–146)

## 2016-06-24 LAB — HEPATIC FUNCTION PANEL
ALT: 14 U/L (ref 9–46)
AST: 14 U/L (ref 10–35)
Albumin: 4.3 g/dL (ref 3.6–5.1)
Alkaline Phosphatase: 76 U/L (ref 40–115)
BILIRUBIN DIRECT: 0.1 mg/dL (ref <=0.2)
Indirect Bilirubin: 0.2 mg/dL (ref 0.2–1.2)
TOTAL PROTEIN: 6.7 g/dL (ref 6.1–8.1)
Total Bilirubin: 0.3 mg/dL (ref 0.2–1.2)

## 2016-06-24 LAB — TSH: TSH: 0.44 m[IU]/L (ref 0.40–4.50)

## 2016-06-24 LAB — HEMOGLOBIN A1C
Hgb A1c MFr Bld: 8.6 % — ABNORMAL HIGH (ref <5.7)
Mean Plasma Glucose: 200 mg/dL

## 2016-07-03 ENCOUNTER — Telehealth: Payer: Self-pay | Admitting: Medical Oncology

## 2016-07-03 NOTE — Telephone Encounter (Signed)
[name] states that he is having terrible hot flashes during the day and all night long. He received Lupron 06/05/16 for his prostate cancer.He called Dr. Junious Silk and he prescribed Megestrol 20mg  BID prn. He has been taking it for a week but it has not helped. We discussed the side effects of Lupron and he states he just needs reassurance that this is normal. I asked him to call me back with questions and or concerns.

## 2016-07-03 NOTE — Progress Notes (Signed)
Patient received his gold markers and Lupron injection at [facility] for prostate cancer treatment.

## 2016-07-13 ENCOUNTER — Other Ambulatory Visit: Payer: Self-pay | Admitting: *Deleted

## 2016-07-13 MED ORDER — ATORVASTATIN CALCIUM 20 MG PO TABS: 20.0000 mg | ORAL_TABLET | Freq: Every day | ORAL | 2 refills | Status: DC

## 2016-07-13 NOTE — Telephone Encounter (Signed)
Patient requested atorvastatin rx be changed to the 20 mg tab so that he does not have to spilt them.

## 2016-07-17 ENCOUNTER — Encounter: Payer: Self-pay | Admitting: Internal Medicine

## 2016-07-18 ENCOUNTER — Other Ambulatory Visit: Payer: Self-pay | Admitting: Internal Medicine

## 2016-07-18 MED ORDER — METHOCARBAMOL 500 MG PO TABS: 500.0000 mg | ORAL_TABLET | Freq: Three times a day (TID) | ORAL | 1 refills | Status: DC | PRN

## 2016-07-31 ENCOUNTER — Encounter: Payer: Self-pay | Admitting: Internal Medicine

## 2016-07-31 ENCOUNTER — Other Ambulatory Visit: Payer: Self-pay | Admitting: Internal Medicine

## 2016-07-31 MED ORDER — EMPAGLIFLOZIN 25 MG PO TABS: 25.0000 mg | ORAL_TABLET | Freq: Every day | ORAL | 1 refills | Status: DC

## 2016-08-01 ENCOUNTER — Other Ambulatory Visit: Payer: Self-pay | Admitting: Internal Medicine

## 2016-08-01 MED ORDER — TIZANIDINE HCL 4 MG PO TABS: 4.0000 mg | ORAL_TABLET | Freq: Three times a day (TID) | ORAL | 1 refills | Status: DC

## 2016-08-14 ENCOUNTER — Ambulatory Visit
Admission: RE | Admit: 2016-08-14 | Discharge: 2016-08-14 | Disposition: A | Discharge status: Home / Self Care | Payer: Medicare Other | Source: Ambulatory Visit | Attending: Radiation Oncology | Admitting: Radiation Oncology

## 2016-08-14 DIAGNOSIS — C61 Malignant neoplasm of prostate: Secondary | ICD-10-CM

## 2016-08-14 DIAGNOSIS — Z510 Encounter for antineoplastic radiation therapy: Secondary | ICD-10-CM | POA: Insufficient documentation

## 2016-08-14 NOTE — Progress Notes (Signed)
  Radiation Oncology         [phone]) [phone] ________________________________  Name: Michael Guzman MRN: [id number]  Date: 08/14/2016  DOB: Oct 12, 1945  SIMULATION AND TREATMENT PLANNING NOTE    ICD-9-CM ICD-10-CM   1. Malignant neoplasm of prostate ([place]) 185 C61     DIAGNOSIS:   70 y.o. gentleman with stage T1c adenocarcinoma of the prostate with a Gleason's score of 4+4 and a PSA of 7.95  NARRATIVE:  The patient was brought to the [facility].  Identity was confirmed.  All relevant records and images related to the planned course of therapy were reviewed.  The patient freely provided informed written consent to proceed with treatment after reviewing the details related to the planned course of therapy. The consent form was witnessed and verified by the simulation staff.  Then, the patient was set-up in a stable reproducible supine position for radiation therapy.  A vacuum lock pillow device was custom fabricated to position his legs in a reproducible immobilized position.  Then, I performed a urethrogram under sterile conditions to identify the prostatic apex.  CT images were obtained.  Surface markings were placed.  The CT images were loaded into the planning software.  Then the prostate target and avoidance structures including the rectum, bladder, bowel and hips were contoured.  Treatment planning then occurred.  The radiation prescription was entered and confirmed.  A total of 1 complex treatment devices were fabricated. I have requested : Intensity Modulated Radiotherapy (IMRT) is medically necessary for this case for the following reason:  Rectal sparing.Marland Kitchen  PLAN:  The patient will receive 75 Gy in 40 fractions with 45 Gy to pelvic nodes and then boost  ________________________________  Sheral Apley. Tammi Klippel, M.D.

## 2016-08-20 DIAGNOSIS — C61 Malignant neoplasm of prostate: Secondary | ICD-10-CM | POA: Diagnosis not present

## 2016-08-20 DIAGNOSIS — Z510 Encounter for antineoplastic radiation therapy: Secondary | ICD-10-CM | POA: Diagnosis not present

## 2016-08-24 ENCOUNTER — Ambulatory Visit
Admission: RE | Admit: 2016-08-24 | Discharge: 2016-08-24 | Disposition: A | Discharge status: Home / Self Care | Payer: Medicare Other | Source: Ambulatory Visit | Attending: Radiation Oncology | Admitting: Radiation Oncology

## 2016-08-24 DIAGNOSIS — Z510 Encounter for antineoplastic radiation therapy: Secondary | ICD-10-CM | POA: Diagnosis not present

## 2016-08-24 DIAGNOSIS — C61 Malignant neoplasm of prostate: Secondary | ICD-10-CM | POA: Diagnosis not present

## 2016-08-25 ENCOUNTER — Ambulatory Visit
Admission: RE | Admit: 2016-08-25 | Discharge: 2016-08-25 | Disposition: A | Discharge status: Home / Self Care | Payer: Medicare Other | Source: Ambulatory Visit | Attending: Radiation Oncology | Admitting: Radiation Oncology

## 2016-08-25 ENCOUNTER — Ambulatory Visit: Payer: Medicare Other

## 2016-08-25 DIAGNOSIS — C61 Malignant neoplasm of prostate: Secondary | ICD-10-CM | POA: Diagnosis not present

## 2016-08-25 DIAGNOSIS — Z510 Encounter for antineoplastic radiation therapy: Secondary | ICD-10-CM | POA: Diagnosis not present

## 2016-08-26 ENCOUNTER — Ambulatory Visit
Admission: RE | Admit: 2016-08-26 | Discharge: 2016-08-26 | Disposition: A | Discharge status: Home / Self Care | Payer: Medicare Other | Source: Ambulatory Visit | Attending: Radiation Oncology | Admitting: Radiation Oncology

## 2016-08-26 ENCOUNTER — Ambulatory Visit: Payer: Medicare Other

## 2016-08-26 ENCOUNTER — Encounter: Payer: Self-pay | Admitting: Radiation Oncology

## 2016-08-26 VITALS — BP 133/85 | HR 85 | Temp 98.7°F | Resp 18 | Ht 68.0 in | Wt 192.2 lb

## 2016-08-26 DIAGNOSIS — C61 Malignant neoplasm of prostate: Secondary | ICD-10-CM

## 2016-08-26 DIAGNOSIS — Z510 Encounter for antineoplastic radiation therapy: Secondary | ICD-10-CM | POA: Diagnosis not present

## 2016-08-26 NOTE — Progress Notes (Signed)
  Radiation Oncology         [phone]   Name: Michael Guzman MRN: [id number]   Date: 08/26/2016  DOB: 07/12/1946   Weekly Radiation Therapy Management    ICD-9-CM ICD-10-CM   1. Malignant neoplasm of prostate ([place]) 185 C61     Current Dose: 5.4 Gy  Planned Dose:  45 Gy  Narrative The patient presents for routine under treatment assessment.  Weight and vitals stable. The patient reports having fatigue and very little energy. The patient denies burning with urination. He reports feeling that he is emptying his bladder with a strong urinary stream. Reports nocturia x 5. Denies diarrhea. The patient reports a good appetite. He also reports having hot flashes at night, and is very bothered by side effects of his Lupron injections.  The patient is without complaint. Set-up films were reviewed. The chart was checked.  Physical Findings  height is 5\' 8"  (1.727 m) and weight is 192 lb 3.2 oz (87.2 kg). His oral temperature is 98.7 F (37.1 C). His blood pressure is 133/85 and his pulse is 85. His respiration is 18 and oxygen saturation is 100%. . Weight essentially stable.  No significant changes.  Impression The patient is tolerating radiation.  Plan Continue treatment as planned. I encouraged the patient to discuss his Lupron injections with Dr. Junious Silk when he has his next injection scheduled, especially if his symptoms continue to be so severe.         Sheral Apley Tammi Klippel, M.D.  This document serves as a record of services personally performed by Tyler Pita, MD. It was created on his behalf by Maryla Morrow, a trained medical scribe. The creation of this record is based on the scribe's personal observations and the provider's statements to them. This document has been checked and approved by the attending provider.

## 2016-08-26 NOTE — Progress Notes (Signed)
Weight and vital signs are stable.   Patient teaching reviewed areas of pertinence such as diarrhea and fatigue,urinary frequency,urinary urgency and urinary burning . Pt able to give teach back of have Imodium on hand and drink plenty of water. Pt demonstrated understanding of information given and will contact nursing with any questions or concerns.  Denies burning  when urinating,diarrhea.  Feels he is emptying his bladder with a strong urinary stream.  Nocturia x 5.  Reports having fatigue, little energy.  Has a good appetite.  Having hot flashes at night. Wt Readings from Last 3 Encounters:  08/26/16 192 lb 3.2 oz (87.2 kg)  06/23/16 188 lb (85.3 kg)  05/12/16 188 lb (85.3 kg)  BP 133/85   Pulse 85   Temp 98.7 F (37.1 C) (Oral)   Resp 18   Ht 5\' 8"  (1.727 m)   Wt 192 lb 3.2 oz (87.2 kg)   SpO2 100%   BMI 29.22 kg/m

## 2016-08-27 ENCOUNTER — Ambulatory Visit
Admission: RE | Admit: 2016-08-27 | Discharge: 2016-08-27 | Disposition: A | Discharge status: Home / Self Care | Payer: Medicare Other | Source: Ambulatory Visit | Attending: Radiation Oncology | Admitting: Radiation Oncology

## 2016-08-27 ENCOUNTER — Ambulatory Visit: Payer: Medicare Other

## 2016-08-27 DIAGNOSIS — C61 Malignant neoplasm of prostate: Secondary | ICD-10-CM | POA: Diagnosis not present

## 2016-08-27 DIAGNOSIS — Z510 Encounter for antineoplastic radiation therapy: Secondary | ICD-10-CM | POA: Diagnosis not present

## 2016-08-28 ENCOUNTER — Ambulatory Visit
Admission: RE | Admit: 2016-08-28 | Discharge: 2016-08-28 | Disposition: A | Discharge status: Home / Self Care | Payer: Medicare Other | Source: Ambulatory Visit | Attending: Radiation Oncology | Admitting: Radiation Oncology

## 2016-08-28 ENCOUNTER — Ambulatory Visit: Payer: Medicare Other

## 2016-08-28 DIAGNOSIS — C61 Malignant neoplasm of prostate: Secondary | ICD-10-CM | POA: Diagnosis not present

## 2016-08-28 DIAGNOSIS — Z510 Encounter for antineoplastic radiation therapy: Secondary | ICD-10-CM | POA: Diagnosis not present

## 2016-08-31 ENCOUNTER — Ambulatory Visit: Payer: Medicare Other

## 2016-08-31 ENCOUNTER — Ambulatory Visit
Admission: RE | Admit: 2016-08-31 | Discharge: 2016-08-31 | Disposition: A | Discharge status: Home / Self Care | Payer: Medicare Other | Source: Ambulatory Visit | Attending: Radiation Oncology | Admitting: Radiation Oncology

## 2016-08-31 DIAGNOSIS — Z510 Encounter for antineoplastic radiation therapy: Secondary | ICD-10-CM | POA: Diagnosis not present

## 2016-08-31 DIAGNOSIS — C61 Malignant neoplasm of prostate: Secondary | ICD-10-CM | POA: Diagnosis not present

## 2016-09-01 ENCOUNTER — Ambulatory Visit: Payer: Medicare Other

## 2016-09-01 ENCOUNTER — Ambulatory Visit
Admission: RE | Admit: 2016-09-01 | Discharge: 2016-09-01 | Disposition: A | Discharge status: Home / Self Care | Payer: Medicare Other | Source: Ambulatory Visit | Attending: Radiation Oncology | Admitting: Radiation Oncology

## 2016-09-01 DIAGNOSIS — Z510 Encounter for antineoplastic radiation therapy: Secondary | ICD-10-CM | POA: Diagnosis not present

## 2016-09-01 DIAGNOSIS — C61 Malignant neoplasm of prostate: Secondary | ICD-10-CM | POA: Diagnosis not present

## 2016-09-02 ENCOUNTER — Ambulatory Visit
Admission: RE | Admit: 2016-09-02 | Discharge: 2016-09-02 | Disposition: A | Discharge status: Home / Self Care | Payer: Medicare Other | Source: Ambulatory Visit | Attending: Radiation Oncology | Admitting: Radiation Oncology

## 2016-09-02 ENCOUNTER — Ambulatory Visit: Payer: Medicare Other

## 2016-09-02 DIAGNOSIS — Z510 Encounter for antineoplastic radiation therapy: Secondary | ICD-10-CM | POA: Diagnosis not present

## 2016-09-02 DIAGNOSIS — C61 Malignant neoplasm of prostate: Secondary | ICD-10-CM | POA: Diagnosis not present

## 2016-09-03 ENCOUNTER — Ambulatory Visit: Payer: Medicare Other

## 2016-09-03 ENCOUNTER — Ambulatory Visit
Admission: RE | Admit: 2016-09-03 | Discharge: 2016-09-03 | Disposition: A | Discharge status: Home / Self Care | Payer: Medicare Other | Source: Ambulatory Visit | Attending: Radiation Oncology | Admitting: Radiation Oncology

## 2016-09-03 DIAGNOSIS — C61 Malignant neoplasm of prostate: Secondary | ICD-10-CM | POA: Diagnosis not present

## 2016-09-03 DIAGNOSIS — Z510 Encounter for antineoplastic radiation therapy: Secondary | ICD-10-CM | POA: Diagnosis not present

## 2016-09-04 ENCOUNTER — Ambulatory Visit: Payer: Medicare Other

## 2016-09-04 ENCOUNTER — Ambulatory Visit
Admission: RE | Admit: 2016-09-04 | Discharge: 2016-09-04 | Disposition: A | Discharge status: Home / Self Care | Payer: Medicare Other | Source: Ambulatory Visit | Attending: Radiation Oncology | Admitting: Radiation Oncology

## 2016-09-04 ENCOUNTER — Encounter: Payer: Self-pay | Admitting: Radiation Oncology

## 2016-09-04 VITALS — BP 115/72 | HR 68 | Temp 99.1°F | Resp 20 | Wt 189.6 lb

## 2016-09-04 DIAGNOSIS — Z510 Encounter for antineoplastic radiation therapy: Secondary | ICD-10-CM | POA: Diagnosis not present

## 2016-09-04 DIAGNOSIS — C61 Malignant neoplasm of prostate: Secondary | ICD-10-CM | POA: Diagnosis not present

## 2016-09-04 NOTE — Progress Notes (Signed)
  Radiation Oncology         [phone]   Name: Michael Guzman MRN: [id number]   Date: 09/04/2016  DOB: 12-Oct-1945   Weekly Radiation Therapy Management    ICD-9-CM ICD-10-CM   1. Malignant neoplasm of prostate (HCC) 185 C61     Current Dose: 18 Gy  Planned Dose:  75 Gy  Narrative The patient presents for routine under treatment assessment.  The patient has completed 10/40 fractions to his prostatic fossa. He reports a good stream except at night, the very first time he tries to urinate he has difficulty getting started, then the stream is strong. He reports nocturia x 4-5. Denies hematuria. He reports having diarrhea, which he manages with Imodium. The patient reports he is very tired and has no energy. He is still having hot flashes, weakness, and sweats. His last Lupron injection was in September.  The patient is without complaint. Set-up films were reviewed. The chart was checked.  Physical Findings  weight is 189 lb 9.6 oz (86 kg). His oral temperature is 99.1 F (37.3 C). His blood pressure is 115/72 and his pulse is 68. His respiration is 20.  Weight essentially stable.  No significant changes.  Impression The patient is tolerating radiation.  Plan Continue treatment as planned.         Sheral Apley Tammi Klippel, M.D.  This document serves as a record of services personally performed by Tyler Pita, MD. It was created on his behalf by Maryla Morrow, a trained medical scribe. The creation of this record is based on the scribe's personal observations and the provider's statements to them. This document has been checked and approved by the attending provider.

## 2016-09-04 NOTE — Progress Notes (Signed)
Weekly rad txs prostate 10/40 completed, no hematuria,  Has good stream except at night first time he has hard time starting then after that his stream is good,  Nocturia 4-5x,  C/o no energy, just so tired, his lupron injection was given in September, still having hoy flashes, weakness, sweats, having diarrhea, took imodium 12:13 PM BP 115/72 (BP Location: Left Arm, Patient Position: Sitting, Cuff Size: Normal)   Pulse 68   Temp 99.1 F (37.3 C) (Oral)   Resp 20   Wt 189 lb 9.6 oz (86 kg)   BMI 28.83 kg/m   Wt Readings from Last 3 Encounters:  09/04/16 189 lb 9.6 oz (86 kg)  08/26/16 192 lb 3.2 oz (87.2 kg)  06/23/16 188 lb (85.3 kg)

## 2016-09-07 ENCOUNTER — Encounter: Payer: Self-pay | Admitting: Internal Medicine

## 2016-09-08 ENCOUNTER — Ambulatory Visit: Payer: Medicare Other

## 2016-09-08 ENCOUNTER — Ambulatory Visit
Admission: RE | Admit: 2016-09-08 | Discharge: 2016-09-08 | Disposition: A | Discharge status: Home / Self Care | Payer: Medicare Other | Source: Ambulatory Visit | Attending: Radiation Oncology | Admitting: Radiation Oncology

## 2016-09-08 DIAGNOSIS — C61 Malignant neoplasm of prostate: Secondary | ICD-10-CM | POA: Diagnosis not present

## 2016-09-08 DIAGNOSIS — Z510 Encounter for antineoplastic radiation therapy: Secondary | ICD-10-CM | POA: Diagnosis not present

## 2016-09-09 ENCOUNTER — Ambulatory Visit: Payer: Medicare Other

## 2016-09-09 ENCOUNTER — Ambulatory Visit
Admission: RE | Admit: 2016-09-09 | Discharge: 2016-09-09 | Disposition: A | Discharge status: Home / Self Care | Payer: Medicare Other | Source: Ambulatory Visit | Attending: Radiation Oncology | Admitting: Radiation Oncology

## 2016-09-09 DIAGNOSIS — C61 Malignant neoplasm of prostate: Secondary | ICD-10-CM | POA: Diagnosis not present

## 2016-09-09 DIAGNOSIS — Z510 Encounter for antineoplastic radiation therapy: Secondary | ICD-10-CM | POA: Diagnosis not present

## 2016-09-10 ENCOUNTER — Ambulatory Visit
Admission: RE | Admit: 2016-09-10 | Discharge: 2016-09-10 | Disposition: A | Discharge status: Home / Self Care | Payer: Medicare Other | Source: Ambulatory Visit | Attending: Radiation Oncology | Admitting: Radiation Oncology

## 2016-09-10 ENCOUNTER — Ambulatory Visit: Payer: Medicare Other

## 2016-09-10 DIAGNOSIS — Z510 Encounter for antineoplastic radiation therapy: Secondary | ICD-10-CM | POA: Diagnosis not present

## 2016-09-10 DIAGNOSIS — C61 Malignant neoplasm of prostate: Secondary | ICD-10-CM | POA: Diagnosis not present

## 2016-09-11 ENCOUNTER — Ambulatory Visit
Admission: RE | Admit: 2016-09-11 | Discharge: 2016-09-11 | Disposition: A | Discharge status: Home / Self Care | Payer: Medicare Other | Source: Ambulatory Visit | Attending: Radiation Oncology | Admitting: Radiation Oncology

## 2016-09-11 ENCOUNTER — Ambulatory Visit: Payer: Medicare Other

## 2016-09-11 VITALS — BP 135/95 | HR 80 | Resp 18 | Wt 193.2 lb

## 2016-09-11 DIAGNOSIS — C61 Malignant neoplasm of prostate: Secondary | ICD-10-CM

## 2016-09-11 DIAGNOSIS — Z510 Encounter for antineoplastic radiation therapy: Secondary | ICD-10-CM | POA: Diagnosis not present

## 2016-09-11 NOTE — Progress Notes (Signed)
Weight and vitals stable. Denies pain. Reports since receiving his Lupron injection in September he has experience fatigue and hot flashes. Reports nocturia x 5. Denies diarrhea. Reports taking Miralax to manage constipation. Describes a strong steady urine stream except for initial void during the night. Denies incontinence or leakage.   BP (!) 135/95 (BP Location: Left Arm, Patient Position: Sitting, Cuff Size: Normal)   Pulse 80   Resp 18   Wt 193 lb 3.2 oz (87.6 kg)   SpO2 100%   BMI 29.38 kg/m  Wt Readings from Last 3 Encounters:  09/11/16 193 lb 3.2 oz (87.6 kg)  09/04/16 189 lb 9.6 oz (86 kg)  08/26/16 192 lb 3.2 oz (87.2 kg)

## 2016-09-11 NOTE — Progress Notes (Signed)
  Radiation Oncology         [phone]   Name: Michael Guzman MRN: [id number]   Date: 09/11/2016  DOB: Nov 13, 1945   Weekly Radiation Therapy Management    ICD-9-CM ICD-10-CM   1. Malignant neoplasm of prostate ([place]) 185 C61     Current Dose: 25.2 Gy  Planned Dose:  75 Gy  Narrative The patient presents for routine under treatment assessment.  Weight and vitals stable. Denies pain. Reports since receiving his Lupron injection in September he has experienced fatigue and hot flashes. Reports nocturia x 5. Denies diarrhea. Reports taking Miralax to manage constipation. Describes a strong steady urine stream except for initial void during the night. Denies incontinence or leakage.   His last Lupron injection was in September.  The patient is without complaint. Set-up films were reviewed. The chart was checked.  Physical Findings  weight is 193 lb 3.2 oz (87.6 kg). His blood pressure is 135/95 (abnormal) and his pulse is 80. His respiration is 18 and oxygen saturation is 100%.  Weight essentially stable.  No significant changes.  Impression The patient is tolerating radiation.  Plan Continue treatment as planned.        ------------------------------------------------  Jodelle Gross, MD, PhD  This document serves as a record of services personally performed by Kyung Rudd, MD. It was created on his behalf by Maryla Morrow, a trained medical scribe. The creation of this record is based on the scribe's personal observations and the provider's statements to them. This document has been checked and approved by the attending provider.

## 2016-09-15 ENCOUNTER — Ambulatory Visit: Payer: Medicare Other

## 2016-09-15 ENCOUNTER — Ambulatory Visit
Admission: RE | Admit: 2016-09-15 | Discharge: 2016-09-15 | Disposition: A | Discharge status: Home / Self Care | Payer: Medicare Other | Source: Ambulatory Visit | Attending: Radiation Oncology | Admitting: Radiation Oncology

## 2016-09-15 DIAGNOSIS — Z510 Encounter for antineoplastic radiation therapy: Secondary | ICD-10-CM | POA: Diagnosis not present

## 2016-09-15 DIAGNOSIS — C61 Malignant neoplasm of prostate: Secondary | ICD-10-CM | POA: Diagnosis not present

## 2016-09-16 ENCOUNTER — Ambulatory Visit
Admission: RE | Admit: 2016-09-16 | Discharge: 2016-09-16 | Disposition: A | Discharge status: Home / Self Care | Payer: Medicare Other | Source: Ambulatory Visit | Attending: Radiation Oncology | Admitting: Radiation Oncology

## 2016-09-16 ENCOUNTER — Ambulatory Visit: Payer: Medicare Other

## 2016-09-16 DIAGNOSIS — Z510 Encounter for antineoplastic radiation therapy: Secondary | ICD-10-CM | POA: Diagnosis not present

## 2016-09-16 DIAGNOSIS — C61 Malignant neoplasm of prostate: Secondary | ICD-10-CM | POA: Diagnosis not present

## 2016-09-17 ENCOUNTER — Ambulatory Visit: Payer: Medicare Other

## 2016-09-17 ENCOUNTER — Ambulatory Visit
Admission: RE | Admit: 2016-09-17 | Discharge: 2016-09-17 | Disposition: A | Discharge status: Home / Self Care | Payer: Medicare Other | Source: Ambulatory Visit | Attending: Radiation Oncology | Admitting: Radiation Oncology

## 2016-09-17 DIAGNOSIS — C61 Malignant neoplasm of prostate: Secondary | ICD-10-CM | POA: Diagnosis not present

## 2016-09-17 DIAGNOSIS — Z510 Encounter for antineoplastic radiation therapy: Secondary | ICD-10-CM | POA: Diagnosis not present

## 2016-09-18 ENCOUNTER — Ambulatory Visit
Admission: RE | Admit: 2016-09-18 | Discharge: 2016-09-18 | Disposition: A | Discharge status: Home / Self Care | Payer: Medicare Other | Source: Ambulatory Visit | Attending: Radiation Oncology | Admitting: Radiation Oncology

## 2016-09-18 ENCOUNTER — Ambulatory Visit: Payer: Medicare Other

## 2016-09-18 VITALS — BP 134/90 | HR 74 | Resp 18 | Wt 192.0 lb

## 2016-09-18 DIAGNOSIS — Z510 Encounter for antineoplastic radiation therapy: Secondary | ICD-10-CM | POA: Diagnosis not present

## 2016-09-18 DIAGNOSIS — C61 Malignant neoplasm of prostate: Secondary | ICD-10-CM | POA: Diagnosis not present

## 2016-09-18 NOTE — Progress Notes (Signed)
Weight and vitals stable. Denies pain. Reports since receiving his Lupron injection in September he has experience fatigue and hot flashes. Reports nocturia x 5. Denies diarrhea. Reports taking Miralax to manage constipation. Reports Miralax doesn't seem to be effect thus, patient questions use of herbal supplement. Encouraged patient to discuss use of supplement with Dr. Tammi Klippel. Describes a strong steady urine stream except for initial void during the night. Denies incontinence or leakage.   BP 134/90 (BP Location: Left Arm, Patient Position: Sitting, Cuff Size: Normal)   Pulse 74   Resp 18   Wt 192 lb (87.1 kg)   SpO2 100%   BMI 29.19 kg/m  Wt Readings from Last 3 Encounters:  09/18/16 192 lb (87.1 kg)  09/11/16 193 lb 3.2 oz (87.6 kg)  09/04/16 189 lb 9.6 oz (86 kg)

## 2016-09-18 NOTE — Progress Notes (Signed)
  Radiation Oncology         [phone]   Name: Michael Guzman MRN: [id number]   Date: 09/18/2016  DOB: 1946/06/30   Weekly Radiation Therapy Management    ICD-9-CM ICD-10-CM   1. Malignant neoplasm of prostate ([facility]) 185 C61     Current Dose: 32.4 Gy  Planned Dose:  75 Gy  Narrative The patient presents for routine under treatment assessment.  Weight and vitals stable. He denies pain. He reports continuing fatigue and hot flashes since his Lupron injection in September. He reports nocturia x 5. He describes a strong, steady urine stream except for initial void during the night. Denies urinary incontinence or leakage. Denies diarrhea. He reports taking Miralax to manage constipation, though he does not think it is helping and questions the possibility of using herbal supplements. He also questions if injections from his orthopedist would interfere with radiation.  The patient is without complaint. Set-up films were reviewed. The chart was checked.  Physical Findings  weight is 192 lb (87.1 kg). His blood pressure is 134/90 and his pulse is 74. His respiration is 18 and oxygen saturation is 100%.  Weight essentially stable.  No significant changes.  Impression The patient is tolerating radiation.  Plan Continue treatment as planned. I reviewed the patient's herbal tea and have no concerns at this time. I encouraged the patient that any injections indicated by his orthopedist should not interfere with radiation.         Sheral Apley Tammi Klippel, M.D.  This document serves as a record of services personally performed by Tyler Pita, MD. It was created on his behalf by Maryla Morrow, a trained medical scribe. The creation of this record is based on the scribe's personal observations and the provider's statements to them. This document has been checked and approved by the attending provider.

## 2016-09-21 ENCOUNTER — Ambulatory Visit
Admission: RE | Admit: 2016-09-21 | Discharge: 2016-09-21 | Disposition: A | Discharge status: Home / Self Care | Payer: Medicare Other | Source: Ambulatory Visit | Attending: Radiation Oncology | Admitting: Radiation Oncology

## 2016-09-21 ENCOUNTER — Ambulatory Visit: Payer: Medicare Other

## 2016-09-21 DIAGNOSIS — Z510 Encounter for antineoplastic radiation therapy: Secondary | ICD-10-CM | POA: Diagnosis not present

## 2016-09-21 DIAGNOSIS — C61 Malignant neoplasm of prostate: Secondary | ICD-10-CM | POA: Diagnosis not present

## 2016-09-22 ENCOUNTER — Encounter: Payer: Self-pay | Admitting: Medical Oncology

## 2016-09-22 ENCOUNTER — Ambulatory Visit: Payer: Medicare Other

## 2016-09-22 ENCOUNTER — Ambulatory Visit
Admission: RE | Admit: 2016-09-22 | Discharge: 2016-09-22 | Disposition: A | Discharge status: Home / Self Care | Payer: Medicare Other | Source: Ambulatory Visit | Attending: Radiation Oncology | Admitting: Radiation Oncology

## 2016-09-22 DIAGNOSIS — Z510 Encounter for antineoplastic radiation therapy: Secondary | ICD-10-CM | POA: Diagnosis not present

## 2016-09-22 DIAGNOSIS — C61 Malignant neoplasm of prostate: Secondary | ICD-10-CM | POA: Diagnosis not present

## 2016-09-22 NOTE — Progress Notes (Signed)
Mr. Glazebrook states he is tolerating radiation well. His main complaint is the fatigue he has experienced since he got the Lupron in September.

## 2016-09-23 ENCOUNTER — Ambulatory Visit
Admission: RE | Admit: 2016-09-23 | Discharge: 2016-09-23 | Disposition: A | Discharge status: Home / Self Care | Payer: Medicare Other | Source: Ambulatory Visit | Attending: Radiation Oncology | Admitting: Radiation Oncology

## 2016-09-23 ENCOUNTER — Ambulatory Visit: Payer: Medicare Other

## 2016-09-23 DIAGNOSIS — Z510 Encounter for antineoplastic radiation therapy: Secondary | ICD-10-CM | POA: Diagnosis not present

## 2016-09-23 DIAGNOSIS — C61 Malignant neoplasm of prostate: Secondary | ICD-10-CM | POA: Diagnosis not present

## 2016-09-24 ENCOUNTER — Ambulatory Visit: Payer: Medicare Other

## 2016-09-24 ENCOUNTER — Ambulatory Visit
Admission: RE | Admit: 2016-09-24 | Discharge: 2016-09-24 | Disposition: A | Discharge status: Home / Self Care | Payer: Medicare Other | Source: Ambulatory Visit | Attending: Radiation Oncology | Admitting: Radiation Oncology

## 2016-09-24 ENCOUNTER — Encounter: Payer: Self-pay | Admitting: Internal Medicine

## 2016-09-24 DIAGNOSIS — Z510 Encounter for antineoplastic radiation therapy: Secondary | ICD-10-CM | POA: Diagnosis not present

## 2016-09-24 DIAGNOSIS — C61 Malignant neoplasm of prostate: Secondary | ICD-10-CM | POA: Diagnosis not present

## 2016-09-25 ENCOUNTER — Ambulatory Visit
Admission: RE | Admit: 2016-09-25 | Discharge: 2016-09-25 | Disposition: A | Discharge status: Home / Self Care | Payer: Medicare Other | Source: Ambulatory Visit | Attending: Radiation Oncology | Admitting: Radiation Oncology

## 2016-09-25 ENCOUNTER — Encounter: Payer: Self-pay | Admitting: Radiation Oncology

## 2016-09-25 ENCOUNTER — Ambulatory Visit: Payer: Medicare Other

## 2016-09-25 VITALS — BP 127/82 | HR 72 | Resp 16 | Wt 188.0 lb

## 2016-09-25 DIAGNOSIS — C61 Malignant neoplasm of prostate: Secondary | ICD-10-CM | POA: Diagnosis not present

## 2016-09-25 DIAGNOSIS — Z510 Encounter for antineoplastic radiation therapy: Secondary | ICD-10-CM | POA: Diagnosis not present

## 2016-09-25 NOTE — Progress Notes (Signed)
Weight and vitals stable. Denies pain. Reports fatigue and hot flashes related to effects of Lupron injection done in September. Reports after sitting long periods when he goes to void he experiences hesitancy on occasion. Reports constipation resolved once he began back taking his herbal supplements. Reports nocturia x 5. Denies dysuria or hematuria. Reports mostly his urine stream is strong and steady except for initial nightly void. Denies leakage or incontinence.   BP 127/82 (BP Location: Left Arm, Patient Position: Sitting, Cuff Size: Normal)   Pulse 72   Resp 16   Wt 188 lb (85.3 kg)   SpO2 100%   BMI 28.59 kg/m  Wt Readings from Last 3 Encounters:  09/25/16 188 lb (85.3 kg)  09/18/16 192 lb (87.1 kg)  09/11/16 193 lb 3.2 oz (87.6 kg)

## 2016-09-25 NOTE — Progress Notes (Signed)
  Radiation Oncology         [phone]   Name: Michael Guzman MRN: [id number]   Date: 09/25/2016  DOB: Mar 12, 1946   Weekly Radiation Therapy Management    ICD-9-CM ICD-10-CM   1. Malignant neoplasm of prostate ([facility]) 185 C61     Current Dose: 41.4 Gy  Planned Dose:  75 Gy  Narrative The patient presents for routine under treatment assessment.  Weight and vitals stable. Denies pain. Reports moderate fatigue and hot flashes related to effects of Lupron injection done in September. These hot flashes come and go. Reports after sitting long periods when he goes to void he experiences hesitancy on occasion. Reports constipation resolved once he began back taking his herbal supplements. Reports nocturia x 5. Denies dysuria or hematuria. Reports mostly his urine stream is strong and steady except for initial nightly void. Denies leakage or incontinence.   Set-up films were reviewed. The chart was checked.  Physical Findings  weight is 188 lb (85.3 kg). His blood pressure is 127/82 and his pulse is 72. His respiration is 16 and oxygen saturation is 100%.  Weight essentially stable.  No significant changes. Alert and in no acute distress.   Impression The patient is tolerating radiation.  Plan Continue treatment as planned.      Sheral Apley Tammi Klippel, M.D.  This document serves as a record of services personally performed by Tyler Pita, MD. It was created on his behalf by Arlyce Harman, a trained medical scribe. The creation of this record is based on the scribe's personal observations and the provider's statements to them. This document has been checked and approved by the attending provider.

## 2016-09-28 ENCOUNTER — Ambulatory Visit: Payer: Medicare Other

## 2016-09-28 ENCOUNTER — Ambulatory Visit
Admission: RE | Admit: 2016-09-28 | Discharge: 2016-09-28 | Disposition: A | Discharge status: Home / Self Care | Payer: Medicare Other | Source: Ambulatory Visit | Attending: Radiation Oncology | Admitting: Radiation Oncology

## 2016-09-28 DIAGNOSIS — Z510 Encounter for antineoplastic radiation therapy: Secondary | ICD-10-CM | POA: Diagnosis not present

## 2016-09-28 DIAGNOSIS — C61 Malignant neoplasm of prostate: Secondary | ICD-10-CM | POA: Diagnosis not present

## 2016-09-29 ENCOUNTER — Ambulatory Visit: Payer: Medicare Other

## 2016-09-30 ENCOUNTER — Ambulatory Visit: Payer: Medicare Other

## 2016-09-30 ENCOUNTER — Ambulatory Visit
Admission: RE | Admit: 2016-09-30 | Discharge: 2016-09-30 | Disposition: A | Discharge status: Home / Self Care | Payer: Medicare Other | Source: Ambulatory Visit | Attending: Radiation Oncology | Admitting: Radiation Oncology

## 2016-09-30 DIAGNOSIS — Z510 Encounter for antineoplastic radiation therapy: Secondary | ICD-10-CM | POA: Diagnosis not present

## 2016-09-30 DIAGNOSIS — C61 Malignant neoplasm of prostate: Secondary | ICD-10-CM | POA: Diagnosis not present

## 2016-10-01 ENCOUNTER — Ambulatory Visit: Payer: Medicare Other

## 2016-10-01 DIAGNOSIS — C61 Malignant neoplasm of prostate: Secondary | ICD-10-CM | POA: Diagnosis not present

## 2016-10-01 DIAGNOSIS — Z510 Encounter for antineoplastic radiation therapy: Secondary | ICD-10-CM | POA: Diagnosis not present

## 2016-10-02 ENCOUNTER — Ambulatory Visit
Admission: RE | Admit: 2016-10-02 | Discharge: 2016-10-02 | Disposition: A | Discharge status: Home / Self Care | Payer: Medicare Other | Source: Ambulatory Visit | Attending: Radiation Oncology | Admitting: Radiation Oncology

## 2016-10-02 ENCOUNTER — Inpatient Hospital Stay: Admission: RE | Admit: 2016-10-02 | Payer: Medicare Other | Source: Ambulatory Visit | Admitting: Radiation Oncology

## 2016-10-02 DIAGNOSIS — C61 Malignant neoplasm of prostate: Secondary | ICD-10-CM | POA: Diagnosis not present

## 2016-10-02 DIAGNOSIS — Z510 Encounter for antineoplastic radiation therapy: Secondary | ICD-10-CM | POA: Diagnosis not present

## 2016-10-05 ENCOUNTER — Inpatient Hospital Stay: Admission: RE | Admit: 2016-10-05 | Payer: Medicare Other | Source: Ambulatory Visit | Admitting: Radiation Oncology

## 2016-10-05 ENCOUNTER — Ambulatory Visit
Admission: RE | Admit: 2016-10-05 | Discharge: 2016-10-05 | Disposition: A | Discharge status: Home / Self Care | Payer: Medicare Other | Source: Ambulatory Visit | Attending: Radiation Oncology | Admitting: Radiation Oncology

## 2016-10-05 DIAGNOSIS — Z510 Encounter for antineoplastic radiation therapy: Secondary | ICD-10-CM | POA: Diagnosis not present

## 2016-10-05 DIAGNOSIS — C61 Malignant neoplasm of prostate: Secondary | ICD-10-CM | POA: Diagnosis not present

## 2016-10-06 ENCOUNTER — Ambulatory Visit
Admission: RE | Admit: 2016-10-06 | Discharge: 2016-10-06 | Disposition: A | Discharge status: Home / Self Care | Payer: Medicare Other | Source: Ambulatory Visit | Attending: Radiation Oncology | Admitting: Radiation Oncology

## 2016-10-06 ENCOUNTER — Encounter: Payer: Self-pay | Admitting: Internal Medicine

## 2016-10-06 DIAGNOSIS — C61 Malignant neoplasm of prostate: Secondary | ICD-10-CM | POA: Diagnosis not present

## 2016-10-06 DIAGNOSIS — Z510 Encounter for antineoplastic radiation therapy: Secondary | ICD-10-CM | POA: Diagnosis not present

## 2016-10-07 ENCOUNTER — Ambulatory Visit: Payer: Medicare Other

## 2016-10-07 ENCOUNTER — Ambulatory Visit
Admission: RE | Admit: 2016-10-07 | Discharge: 2016-10-07 | Disposition: A | Discharge status: Home / Self Care | Payer: Medicare Other | Source: Ambulatory Visit | Attending: Radiation Oncology | Admitting: Radiation Oncology

## 2016-10-07 VITALS — BP 113/83 | HR 78 | Temp 99.2°F | Resp 18 | Wt 188.4 lb

## 2016-10-07 DIAGNOSIS — Z510 Encounter for antineoplastic radiation therapy: Secondary | ICD-10-CM | POA: Diagnosis not present

## 2016-10-07 DIAGNOSIS — C61 Malignant neoplasm of prostate: Secondary | ICD-10-CM | POA: Diagnosis not present

## 2016-10-07 NOTE — Progress Notes (Signed)
  Radiation Oncology         [phone]   Name: Michael Guzman MRN: [id number]   Date: 10/07/2016  DOB: 1946/05/18   Weekly Radiation Therapy Management    ICD-9-CM ICD-10-CM   1. Malignant neoplasm of prostate (HCC) 185 C61     Current Dose: 55 Gy  Planned Dose:  75 Gy  Narrative The patient presents for routine under treatment assessment.  Weight stable. Nursing notes a low grade fever. The patient reports intermittent groin pain, as well as low back pain such that he has had to use his cane to ambulate. He reports an intermittent dry cough and sneezing. The patient reports hot flashes from Lupron continue. He reports diarrhea x 3 days, and he took one Imodium tablet today with some relief. He reports his urine stream is strong and steady except for initial nightly void. He denies leakage or incontinence. Reports nocturia x 3. The patient experiences occasional dysuria. He reports a poor appetite this week and great fatigue.  Set-up films were reviewed. The chart was checked.  Physical Findings  weight is 188 lb 6.4 oz (85.5 kg). His oral temperature is 99.2 F (37.3 C). His blood pressure is 113/83 and his pulse is 78. His respiration is 18 and oxygen saturation is 100%.  Weight essentially stable.  No significant changes. Alert and in no acute distress.   Impression The patient is tolerating radiation.  Plan Continue treatment as planned.      Sheral Apley Tammi Klippel, M.D.  This document serves as a record of services personally performed by Tyler Pita, MD. It was created on his behalf by Maryla Morrow, a trained medical scribe. The creation of this record is based on the scribe's personal observations and the provider's statements to them. This document has been checked and approved by the attending provider.

## 2016-10-07 NOTE — Progress Notes (Addendum)
Weight stable. Low grade fever noted. Reports intermittent groin pain. Reports low back pain such that he has had to intermittently use his cane to ambulate. Reports an intermittent dry cough and sneezing. Reports hot flashes from lupron continues. Reports diarrhea x 3 days. Reports taking one imodium tablet today with some relief. Reports his urine stream is strong and steady except for initial nightly void. Denies leakage or incontinence. Reports nocturia x 3. Reports occasional dysuria. Reports poor appetite this week. Reports great fatigue.  BP 113/83 (BP Location: Left Arm, Patient Position: Sitting, Cuff Size: Normal)   Pulse 78   Temp 99.2 F (37.3 C) (Oral)   Resp 18   Wt 188 lb 6.4 oz (85.5 kg)   SpO2 100%   BMI 28.65 kg/m  Wt Readings from Last 3 Encounters:  10/07/16 188 lb 6.4 oz (85.5 kg)  09/25/16 188 lb (85.3 kg)  09/18/16 192 lb (87.1 kg)

## 2016-10-08 ENCOUNTER — Ambulatory Visit
Admission: RE | Admit: 2016-10-08 | Discharge: 2016-10-08 | Disposition: A | Discharge status: Home / Self Care | Payer: Medicare Other | Source: Ambulatory Visit | Attending: Radiation Oncology | Admitting: Radiation Oncology

## 2016-10-08 ENCOUNTER — Encounter: Payer: Self-pay | Admitting: Medical Oncology

## 2016-10-08 ENCOUNTER — Encounter: Payer: Self-pay | Admitting: Radiation Oncology

## 2016-10-08 VITALS — BP 118/82 | HR 77 | Temp 98.2°F | Ht 68.0 in | Wt 188.2 lb

## 2016-10-08 DIAGNOSIS — C61 Malignant neoplasm of prostate: Secondary | ICD-10-CM

## 2016-10-08 DIAGNOSIS — Z510 Encounter for antineoplastic radiation therapy: Secondary | ICD-10-CM | POA: Diagnosis not present

## 2016-10-08 NOTE — Progress Notes (Signed)
  Radiation Oncology         [phone]   Name: Michael Guzman MRN: [id number]   Date: 10/08/2016  DOB: 13-Jun-1946   Weekly Radiation Therapy Management    ICD-9-CM ICD-10-CM   1. Malignant neoplasm of prostate (HCC) 185 C61     Current Dose: 57 Gy  Planned Dose:  75 Gy  Narrative The patient presents for routine under treatment assessment.  Michael Guzman presents for his 31st fraction of radiation to prostate. He reports pain to his right leg and sometimes Left. He plans to see his orthopedist soon for this issue. He reports fatigue, and rests often in the afternoon. He reports some burning with urination and a slow urine stream at times. He denies hematuria. He has nocturia x 5. His reports diarrhea yesterday and took an imodium with relief. He reports a looser than normal bowel movement today, but has not taken anything today.  Set-up films were reviewed. The chart was checked.  Physical Findings  height is 5\' 8"  (1.727 m) and weight is 188 lb 3.2 oz (85.4 kg). His temperature is 98.2 F (36.8 C). His blood pressure is 118/82 and his pulse is 77. His oxygen saturation is 100%.  Weight essentially stable.  No significant changes. Alert and in no acute distress.   Impression The patient is tolerating radiation.  Plan Continue treatment as planned.      Sheral Apley Tammi Klippel, M.D.  This document serves as a record of services personally performed by Tyler Pita, MD. It was created on his behalf by Bethann Humble, a trained medical scribe. The creation of this record is based on the scribe's personal observations and the provider's statements to them. This document has been checked and approved by the attending provider.

## 2016-10-08 NOTE — Progress Notes (Signed)
Mr. Doerflein states he is really fatigued and continues to have hot flashes. Tolerating radiation well.

## 2016-10-08 NOTE — Progress Notes (Signed)
Mr. Diles presents for his 31st fraction of radiation to prostate. He reports pain to his Right leg and sometimes Left. He plans to see his orthopedist soon for this issue. He reports fatigue, and rests often in the afternoon. He reports some burning with urination and a slow urine stream at times. He denies hematuria. He has nocturia of 5 times nightly. His reports diarrhea yesterday and took an imodium with relief. He reports a looser than normal bowel movement today, but has not taken anything today.   BP 118/82   Pulse 77   Temp 98.2 F (36.8 C)   Ht 5\' 8"  (1.727 m)   Wt 188 lb 3.2 oz (85.4 kg)   SpO2 100% Comment: room air  BMI 28.62 kg/m    Wt Readings from Last 3 Encounters:  10/08/16 188 lb 3.2 oz (85.4 kg)  10/07/16 188 lb 6.4 oz (85.5 kg)  09/25/16 188 lb (85.3 kg)

## 2016-10-09 ENCOUNTER — Ambulatory Visit
Admission: RE | Admit: 2016-10-09 | Discharge: 2016-10-09 | Disposition: A | Discharge status: Home / Self Care | Payer: Medicare Other | Source: Ambulatory Visit | Attending: Radiation Oncology | Admitting: Radiation Oncology

## 2016-10-09 DIAGNOSIS — Z510 Encounter for antineoplastic radiation therapy: Secondary | ICD-10-CM | POA: Diagnosis not present

## 2016-10-09 DIAGNOSIS — C61 Malignant neoplasm of prostate: Secondary | ICD-10-CM | POA: Diagnosis not present

## 2016-10-12 ENCOUNTER — Ambulatory Visit
Admission: RE | Admit: 2016-10-12 | Discharge: 2016-10-12 | Disposition: A | Discharge status: Home / Self Care | Payer: Medicare Other | Source: Ambulatory Visit | Attending: Radiation Oncology | Admitting: Radiation Oncology

## 2016-10-12 DIAGNOSIS — Z510 Encounter for antineoplastic radiation therapy: Secondary | ICD-10-CM | POA: Diagnosis not present

## 2016-10-12 DIAGNOSIS — C61 Malignant neoplasm of prostate: Secondary | ICD-10-CM | POA: Diagnosis not present

## 2016-10-13 ENCOUNTER — Ambulatory Visit
Admission: RE | Admit: 2016-10-13 | Discharge: 2016-10-13 | Disposition: A | Discharge status: Home / Self Care | Payer: Medicare Other | Source: Ambulatory Visit | Attending: Radiation Oncology | Admitting: Radiation Oncology

## 2016-10-13 DIAGNOSIS — C61 Malignant neoplasm of prostate: Secondary | ICD-10-CM | POA: Diagnosis not present

## 2016-10-13 DIAGNOSIS — Z510 Encounter for antineoplastic radiation therapy: Secondary | ICD-10-CM | POA: Diagnosis not present

## 2016-10-14 ENCOUNTER — Ambulatory Visit
Admission: RE | Admit: 2016-10-14 | Discharge: 2016-10-14 | Disposition: A | Discharge status: Home / Self Care | Payer: Medicare Other | Source: Ambulatory Visit | Attending: Radiation Oncology | Admitting: Radiation Oncology

## 2016-10-14 ENCOUNTER — Other Ambulatory Visit: Payer: Self-pay | Admitting: Radiation Oncology

## 2016-10-14 DIAGNOSIS — R300 Dysuria: Secondary | ICD-10-CM | POA: Diagnosis not present

## 2016-10-14 DIAGNOSIS — Z510 Encounter for antineoplastic radiation therapy: Secondary | ICD-10-CM | POA: Diagnosis not present

## 2016-10-14 DIAGNOSIS — C61 Malignant neoplasm of prostate: Secondary | ICD-10-CM | POA: Diagnosis not present

## 2016-10-14 LAB — URINALYSIS, MICROSCOPIC - CHCC
BLOOD: NEGATIVE
Bilirubin (Urine): NEGATIVE
GLUCOSE UR CHCC: 2000 mg/dL
Ketones: NEGATIVE mg/dL
LEUKOCYTE ESTERASE: NEGATIVE
NITRITE: NEGATIVE
Protein: NEGATIVE mg/dL
RBC / HPF: NEGATIVE (ref 0–2)
SPECIFIC GRAVITY, URINE: 1.02 (ref 1.003–1.035)
UROBILINOGEN UR: 0.2 mg/dL (ref 0.2–1)
WBC, UA: NEGATIVE (ref 0–?)
pH: 6 (ref 4.6–8.0)

## 2016-10-14 NOTE — Progress Notes (Signed)
I saw the patient in the treatment area today while walking through the department. After greeting him, he states he's having trouble with dysuria for the last week. He denies any fevers, chills, or flank pain, hematuria, but is having urinary frequency, urgency, and episodes of incontinence. He was advised to go to the lab after his treatment for UA/Culture with sensitivities. We will follow up on the results of this today. He was also encouraged to take AZO standard and we reviewed the side effect profile.     Carola Rhine, PAC

## 2016-10-15 ENCOUNTER — Ambulatory Visit
Admission: RE | Admit: 2016-10-15 | Discharge: 2016-10-15 | Disposition: A | Discharge status: Home / Self Care | Payer: Medicare Other | Source: Ambulatory Visit | Attending: Radiation Oncology | Admitting: Radiation Oncology

## 2016-10-15 ENCOUNTER — Telehealth: Payer: Self-pay | Admitting: Radiation Oncology

## 2016-10-15 DIAGNOSIS — C61 Malignant neoplasm of prostate: Secondary | ICD-10-CM | POA: Diagnosis not present

## 2016-10-15 DIAGNOSIS — Z510 Encounter for antineoplastic radiation therapy: Secondary | ICD-10-CM | POA: Diagnosis not present

## 2016-10-15 LAB — URINE CULTURE: Organism ID, Bacteria: NO GROWTH

## 2016-10-15 NOTE — Telephone Encounter (Signed)
Per Alison's order phone patient to inform him the urinalysis from yesterday was normal. No answer. Left message requesting return call. If patient doesn't call back I plan to discuss this with him further during PUT tomorrow.

## 2016-10-16 ENCOUNTER — Encounter: Payer: Self-pay | Admitting: *Deleted

## 2016-10-16 ENCOUNTER — Ambulatory Visit
Admission: RE | Admit: 2016-10-16 | Discharge: 2016-10-16 | Disposition: A | Discharge status: Home / Self Care | Payer: Medicare Other | Source: Ambulatory Visit | Attending: Radiation Oncology | Admitting: Radiation Oncology

## 2016-10-16 VITALS — BP 143/92 | HR 70 | Resp 16 | Wt 192.8 lb

## 2016-10-16 DIAGNOSIS — C61 Malignant neoplasm of prostate: Secondary | ICD-10-CM

## 2016-10-16 DIAGNOSIS — Z510 Encounter for antineoplastic radiation therapy: Secondary | ICD-10-CM | POA: Diagnosis not present

## 2016-10-16 NOTE — Progress Notes (Signed)
Weight and vitals stable. Denies pain. Reports dysuria is managed with AZO. Patient understands his recent urine test was normal. Reports fatigue and weakness. Reports nocturia x 3-4 . Denies urinary leakage or incontinence. Reports diarrhea resolved from one week ago. One month follow up appointment card given.   BP (!) 143/92 (BP Location: Right Arm, Patient Position: Sitting, Cuff Size: Normal)   Pulse 70   Resp 16   Wt 192 lb 12.8 oz (87.5 kg)   SpO2 100%   BMI 29.32 kg/m  Wt Readings from Last 3 Encounters:  10/16/16 192 lb 12.8 oz (87.5 kg)  10/08/16 188 lb 3.2 oz (85.4 kg)  10/07/16 188 lb 6.4 oz (85.5 kg)

## 2016-10-16 NOTE — Progress Notes (Signed)
  Radiation Oncology         [phone]   Name: Michael Guzman MRN: [id number]   Date: 10/16/2016  DOB: 04-23-1946   Weekly Radiation Therapy Management    ICD-9-CM ICD-10-CM   1. Malignant neoplasm of prostate (HCC) 185 C61     Current Dose: 69 Gy  Planned Dose:  75 Gy  Narrative The patient presents for routine under treatment assessment.  Weight and vitals stable. Denies pain. Reports dysuria is managed with AZO. Patient understands his recent urine test was normal. Reports fatigue and weakness. This weakness is so bad there are days he doesn't want to get out of bed. Reports nocturia x 3-4. Denies urinary leakage or incontinence. Reports diarrhea resolved from one week ago. Continues to have hot flashes.  Set-up films were reviewed. The chart was checked.  Physical Findings  weight is 192 lb 12.8 oz (87.5 kg). His blood pressure is 143/92 (abnormal) and his pulse is 70. His respiration is 16 and oxygen saturation is 100%.  Weight essentially stable.  No significant changes. Alert and in no acute distress.   Impression The patient is tolerating radiation.  Plan Continue treatment as planned. The patient will complete treatment next week and will return for follow up in 1 month.     Sheral Apley Tammi Klippel, M.D.  This document serves as a record of services personally performed by Tyler Pita, MD. It was created on his behalf by Arlyce Harman, a trained medical scribe. The creation of this record is based on the scribe's personal observations and the provider's statements to them. This document has been checked and approved by the attending provider.

## 2016-10-16 NOTE — Progress Notes (Signed)
0930 Spoke with Michael Guzman I let him know my name was Mardy Lucier nurse that works with Shona Simpson, PA-C that works with Dr. Lisbeth Renshaw and Tammi Klippel made him aware that his urine sample  did not show any infection and his urine culture did not show any growth in the final report.   I mentioned that he could take AZO, he replied that he has been taking the AZO and feels that it is helping and he will speak with Dr. Tammi Klippel today during his appointment.

## 2016-10-16 NOTE — Progress Notes (Signed)
0930 Spoke with Michael Guzman see progress note

## 2016-10-19 ENCOUNTER — Ambulatory Visit
Admission: RE | Admit: 2016-10-19 | Discharge: 2016-10-19 | Disposition: A | Discharge status: Home / Self Care | Payer: Medicare Other | Source: Ambulatory Visit | Attending: Radiation Oncology | Admitting: Radiation Oncology

## 2016-10-19 DIAGNOSIS — Z510 Encounter for antineoplastic radiation therapy: Secondary | ICD-10-CM | POA: Diagnosis not present

## 2016-10-19 DIAGNOSIS — C61 Malignant neoplasm of prostate: Secondary | ICD-10-CM | POA: Diagnosis not present

## 2016-10-20 ENCOUNTER — Ambulatory Visit
Admission: RE | Admit: 2016-10-20 | Discharge: 2016-10-20 | Disposition: A | Discharge status: Home / Self Care | Payer: Medicare Other | Source: Ambulatory Visit | Attending: Radiation Oncology | Admitting: Radiation Oncology

## 2016-10-20 ENCOUNTER — Encounter: Payer: Self-pay | Admitting: Internal Medicine

## 2016-10-20 ENCOUNTER — Ambulatory Visit: Payer: Medicare Other

## 2016-10-20 ENCOUNTER — Ambulatory Visit (INDEPENDENT_AMBULATORY_CARE_PROVIDER_SITE_OTHER): Payer: Medicare Other | Admitting: Internal Medicine

## 2016-10-20 VITALS — BP 124/76 | HR 94 | Temp 98.0°F | Resp 16 | Ht 68.0 in

## 2016-10-20 DIAGNOSIS — R5383 Other fatigue: Secondary | ICD-10-CM

## 2016-10-20 DIAGNOSIS — D509 Iron deficiency anemia, unspecified: Secondary | ICD-10-CM | POA: Diagnosis not present

## 2016-10-20 DIAGNOSIS — C61 Malignant neoplasm of prostate: Secondary | ICD-10-CM | POA: Diagnosis not present

## 2016-10-20 DIAGNOSIS — R300 Dysuria: Secondary | ICD-10-CM

## 2016-10-20 DIAGNOSIS — Z79899 Other long term (current) drug therapy: Secondary | ICD-10-CM

## 2016-10-20 DIAGNOSIS — Z510 Encounter for antineoplastic radiation therapy: Secondary | ICD-10-CM | POA: Diagnosis not present

## 2016-10-20 DIAGNOSIS — J029 Acute pharyngitis, unspecified: Secondary | ICD-10-CM | POA: Diagnosis not present

## 2016-10-20 LAB — HEPATIC FUNCTION PANEL
ALT: 14 U/L (ref 9–46)
AST: 13 U/L (ref 10–35)
Albumin: 4.5 g/dL (ref 3.6–5.1)
Alkaline Phosphatase: 53 U/L (ref 40–115)
BILIRUBIN DIRECT: 0.2 mg/dL (ref <=0.2)
BILIRUBIN INDIRECT: 0.8 mg/dL (ref 0.2–1.2)
TOTAL PROTEIN: 7.1 g/dL (ref 6.1–8.1)
Total Bilirubin: 1 mg/dL (ref 0.2–1.2)

## 2016-10-20 LAB — TSH: TSH: 0.41 mIU/L (ref 0.40–4.50)

## 2016-10-20 LAB — BASIC METABOLIC PANEL WITH GFR
BUN: 16 mg/dL (ref 7–25)
CO2: 20 mmol/L (ref 20–31)
Calcium: 9.6 mg/dL (ref 8.6–10.3)
Chloride: 109 mmol/L (ref 98–110)
Creat: 0.91 mg/dL (ref 0.70–1.25)
GFR, Est African American: >89 mL/min (ref >=60)
GFR, Est Non African American: 86 mL/min (ref >=60)
Glucose, Bld: 127 mg/dL — ABNORMAL HIGH (ref 65–99)
POTASSIUM: 3.9 mmol/L (ref 3.5–5.3)
Sodium: 142 mmol/L (ref 135–146)

## 2016-10-20 LAB — CBC WITH DIFFERENTIAL/PLATELET
BASOS PCT: 0 %
Basophils Absolute: 0 cells/uL (ref 0–200)
EOS ABS: 276 {cells}/uL (ref 15–500)
Eosinophils Relative: 3 %
HEMATOCRIT: 36.4 % — AB (ref 38.5–50.0)
HEMOGLOBIN: 11.8 g/dL — AB (ref 13.2–17.1)
LYMPHS PCT: 6 %
Lymphs Abs: 552 cells/uL — ABNORMAL LOW (ref 850–3900)
MCH: 29.4 pg (ref 27.0–33.0)
MCHC: 32.4 g/dL (ref 32.0–36.0)
MCV: 90.5 fL (ref 80.0–100.0)
MONO ABS: 644 {cells}/uL (ref 200–950)
MPV: 10.1 fL (ref 7.5–12.5)
Monocytes Relative: 7 %
NEUTROS ABS: 7728 {cells}/uL (ref 1500–7800)
Neutrophils Relative %: 84 %
Platelets: 248 10*3/uL (ref 140–400)
RBC: 4.02 MIL/uL — ABNORMAL LOW (ref 4.20–5.80)
RDW: 15.7 % — ABNORMAL HIGH (ref 11.0–15.0)
WBC: 9.2 10*3/uL (ref 3.8–10.8)

## 2016-10-20 LAB — IRON AND TIBC
%SAT: 26 % (ref 15–60)
Iron: 82 ug/dL (ref 50–180)
TIBC: 310 ug/dL (ref 250–425)
UIBC: 228 ug/dL (ref 125–400)

## 2016-10-20 MED ORDER — PROMETHAZINE-DM 6.25-15 MG/5ML PO SYRP: ORAL_SOLUTION | ORAL | 1 refills | Status: DC

## 2016-10-20 MED ORDER — LEVOFLOXACIN 750 MG PO TABS: 750.0000 mg | ORAL_TABLET | Freq: Every day | ORAL | 0 refills | Status: DC

## 2016-10-20 MED ORDER — PREDNISONE 20 MG PO TABS: ORAL_TABLET | ORAL | 0 refills | Status: DC

## 2016-10-20 MED ORDER — LIDOCAINE VISCOUS 2 % MT SOLN: 20.0000 mL | OROMUCOSAL | 0 refills | Status: DC | PRN

## 2016-10-20 NOTE — Progress Notes (Signed)
HPI  Patient presents to the office for evaluation of cough.  It has been going on for 3 days.  Patient reports chest congestion, sore throat that is severe, and nasal congestion.  They also endorse change in voice, chills, postnasal drip and nasal congestion, ear pain.  .  They have tried none or nyquil.  They report that nothing has worked.  They admits to other sick contacts.  He reports that he has been having a lot of issues lately with lethargy.  He reports that he has been having days where he falls asleep and then he can't get himself woken up.  He will have days where somebody has to come and wake him up.  He reports that he has had some issues with insomnia.  He reports that he will fall asleep for 2 hours and then he will end up awake all night.  He reports that he has tried taking some medication before.  His wife reports that he snores.  He has also been having nose bleeds at well.  He notes that he has been taking AZO. He has been having trouble passing urine.  He has had no burning when urinating.  He does admit to some testicular pain when they are doing radiation.      Review of Systems  Constitutional: Positive for chills, fever and malaise/fatigue. Negative for diaphoresis.  HENT: Positive for congestion, ear pain, sinus pain and sore throat.   Eyes: Negative.   Respiratory: Positive for cough and sputum production. Negative for shortness of breath and wheezing.   Cardiovascular: Negative for chest pain, palpitations and leg swelling.  Gastrointestinal: Negative for blood in stool, constipation, diarrhea, heartburn and melena.  Genitourinary: Positive for dysuria. Negative for flank pain, frequency, hematuria and urgency.  Psychiatric/Behavioral: Negative for depression. The patient has insomnia. The patient is not nervous/anxious.     PE:  Vitals:   10/20/16 0953  BP: 124/76  Pulse: 94  Resp: 16  Temp: 98 F (36.7 C)    General:  Alert and severely fatigued appearing,  WDWN, NAD HEENT: NCAT, PERLA, EOM normal, no occular discharge or erythema.  Nasal mucosal edema with sinus tenderness to palpation.  Oropharynx clear with oropharyngeal edema and erythema.No trismus, uvula midline.  No oropharyngeal exudate. Mucous membranes moist and pink. Neck:  No Cervical adenopathy Chest:  RRR no MRGs.  Lungs with some rhonchi in LUL with occasional end expiratory wheeze.  No appreciable wheeze   Abdomen: +BS x 4 quadrants, soft, non-tender, no guarding, rigidity, or rebound. Skin: warm and dry no rash Neuro: A&Ox4, CN II-XII grossly intact  Assessment and Plan:  Patient appears severe fatigued in the office today.  Two possible etiology of worsening fatigue includes UTI vs. CAP.  Will place on levaquin for coverage of infection empirically.  Do feel that this could be related to radiation.  Patient has two more treatments to go through.  There is also something to be said that sleep patterns are severely disrupted and feel that sleep apnea could play a role in why sleep is not satisfying to patient.  He does have a history of snoring witnessed by his wife.  Do feel that this is worth further exploration if all testing normal.    1. Dysuria -possible irritation from radiation for prostate cancer vs. Possible infection.   - Urinalysis, Routine w reflex microscopic - Urine culture  2. Medication management  - CBC with Differential/Platelet - BASIC METABOLIC PANEL WITH GFR - Hepatic  function panel  3. Fatigue, unspecified type  - TSH - Iron and TIBC  4. Acute pharyngitis, unspecified etiology  - levofloxacin (LEVAQUIN) 750 MG tablet; Take 1 tablet (750 mg total) by mouth daily. X 7 days  Dispense: 7 tablet; Refill: 0 - predniSONE (DELTASONE) 20 MG tablet; 3 tabs po daily x 3 days, then 2 tabs x 3 days, then 1.5 tabs x 3 days, then 1 tab x 3 days, then 0.5 tabs x 3 days  Dispense: 27 tablet; Refill: 0 - promethazine-dextromethorphan (PROMETHAZINE-DM) 6.25-15 MG/5ML  syrup; Take 5-10 ML PO q8hrs prn for cough  Dispense: 360 mL; Refill: 1 - lidocaine (XYLOCAINE) 2 % solution; Use as directed 20 mLs in the mouth or throat as needed for mouth pain.  Dispense: 100 mL; Refill: 0

## 2016-10-21 ENCOUNTER — Encounter: Payer: Self-pay | Admitting: Radiation Oncology

## 2016-10-21 ENCOUNTER — Ambulatory Visit
Admission: RE | Admit: 2016-10-21 | Discharge: 2016-10-21 | Disposition: A | Discharge status: Home / Self Care | Payer: Medicare Other | Source: Ambulatory Visit | Attending: Radiation Oncology | Admitting: Radiation Oncology

## 2016-10-21 DIAGNOSIS — C61 Malignant neoplasm of prostate: Secondary | ICD-10-CM | POA: Diagnosis not present

## 2016-10-21 DIAGNOSIS — Z510 Encounter for antineoplastic radiation therapy: Secondary | ICD-10-CM | POA: Diagnosis not present

## 2016-10-21 LAB — URINALYSIS, ROUTINE W REFLEX MICROSCOPIC
Bilirubin Urine: NEGATIVE
Hgb urine dipstick: NEGATIVE
Leukocytes, UA: NEGATIVE
NITRITE: POSITIVE — AB
PH: 5 (ref 5.0–8.0)
Protein, ur: NEGATIVE
SPECIFIC GRAVITY, URINE: 1.03 (ref 1.001–1.035)

## 2016-10-21 LAB — URINALYSIS, MICROSCOPIC ONLY
Bacteria, UA: NONE SEEN [HPF]
CASTS: NONE SEEN [LPF]
CRYSTALS: NONE SEEN [HPF]
WBC, UA: NONE SEEN WBC/HPF (ref <=5)
YEAST: NONE SEEN [HPF]

## 2016-10-21 LAB — URINE CULTURE: Organism ID, Bacteria: NO GROWTH

## 2016-10-22 ENCOUNTER — Telehealth: Payer: Self-pay | Admitting: Medical Oncology

## 2016-10-22 NOTE — Progress Notes (Signed)
  Radiation Oncology         [phone]) [phone] ________________________________  Name: Michael Guzman MRN: [id number]  Date: 10/21/2016  DOB: 07-Oct-1945  End of Treatment Note  Diagnosis: 71 y.o. gentleman with stage T1c adenocarcinoma of the prostate with a Gleason's score of 4+4 and a PSA of 7.95  Indication for treatment:  Curative, Definitive Radiotherapy       Radiation treatment dates:   08/24/16 - 10/21/16  Site/dose:   The prostate was treated to 75 Gy in 40 fractions of 1.8 Gy for 25 fractions and a boost of 2 Gy for 15 fractions.  Beams/energy:   The patient was treated with IMRT using volumetric arc therapy delivering 6 MV X-rays to clockwise and counterclockwise circumferential arcs with a 90 degree collimator offset to avoid dose scalloping.  Image guidance was performed with daily cone beam CT prior to each fraction to align to gold markers in the prostate and assure proper bladder and rectal fill volumes.  Immobilization was achieved with [employer] custom mold.  Narrative: The patient tolerated radiation treatment relatively well. The patient experienced some minor urinary irritation and modest fatigue. The patient had dysuria, fatigue, nocturia x 3-4, diarrhea, and hot flashes from Lupron.  Plan: The patient has completed radiation treatment. He will return to radiation oncology clinic for routine followup in one month. I advised him to call or return sooner if he has any questions or concerns related to his recovery or treatment. ________________________________  Sheral Apley. Tammi Klippel, M.D.  This document serves as a record of services personally performed by Tyler Pita, MD. It was created on his behalf by Darcus Austin, a trained medical scribe. The creation of this record is based on the scribe's personal observations and the provider's statements to them. This document has been checked and approved by the attending provider.

## 2016-10-22 NOTE — Progress Notes (Signed)
I calledl Michael Guzman to congratulate him on completing radiation 10/21/16. He states he is happy to be completed. He complains of being fatigued and not sleeping well. We discussed the side effects of Lupron and radiation which can cause fatigue. We discussed that his energy should begin to improve and his urinary issues. He loves to fish and hopes to back soon. He has follow up in March with Enid Skeens. I asked him to call me with any questions or concerns. He voiced understanding.

## 2016-10-24 ENCOUNTER — Encounter: Payer: Self-pay | Admitting: Internal Medicine

## 2016-10-26 ENCOUNTER — Other Ambulatory Visit: Payer: Self-pay | Admitting: *Deleted

## 2016-10-26 MED ORDER — EMPAGLIFLOZIN 25 MG PO TABS: 25.0000 mg | ORAL_TABLET | Freq: Every day | ORAL | 1 refills | Status: DC

## 2016-10-27 ENCOUNTER — Encounter: Payer: Self-pay | Admitting: Internal Medicine

## 2016-10-30 ENCOUNTER — Telehealth: Payer: Self-pay | Admitting: Radiation Oncology

## 2016-10-30 ENCOUNTER — Encounter: Payer: Self-pay | Admitting: *Deleted

## 2016-10-30 NOTE — Telephone Encounter (Signed)
Received voicemail message from patient requesting return call. Patient reports continued weakness and fatigue causing his to have to nap often during the day. Patient complained of fatigue and weakness during radiation related to the effects of androgen deprivation. Explained to the patient that fatigue associated with radiation therapy should be tapering off at this point. Patient reports he came down with the flu following completion of xrt. Explained that his fatigue and weakness sounds multifactorial and he should consider following up with his PCP to discuss management option and rule out a major health concern. Patient verbalized understanding.

## 2016-11-03 ENCOUNTER — Encounter: Payer: Self-pay | Admitting: Internal Medicine

## 2016-11-09 ENCOUNTER — Ambulatory Visit (INDEPENDENT_AMBULATORY_CARE_PROVIDER_SITE_OTHER): Payer: Medicare Other | Admitting: Cardiology

## 2016-11-09 ENCOUNTER — Encounter: Payer: Self-pay | Admitting: Cardiology

## 2016-11-09 VITALS — BP 124/96 | HR 104 | Ht 70.0 in | Wt 179.8 lb

## 2016-11-09 DIAGNOSIS — E782 Mixed hyperlipidemia: Secondary | ICD-10-CM | POA: Diagnosis not present

## 2016-11-09 DIAGNOSIS — I2510 Atherosclerotic heart disease of native coronary artery without angina pectoris: Secondary | ICD-10-CM | POA: Diagnosis not present

## 2016-11-09 DIAGNOSIS — C61 Malignant neoplasm of prostate: Secondary | ICD-10-CM

## 2016-11-09 DIAGNOSIS — E118 Type 2 diabetes mellitus with unspecified complications: Secondary | ICD-10-CM

## 2016-11-09 DIAGNOSIS — Z9861 Coronary angioplasty status: Secondary | ICD-10-CM

## 2016-11-09 NOTE — Patient Instructions (Signed)

## 2016-11-09 NOTE — Progress Notes (Signed)
[place]. [address], [place]  [address] Phone: [phone] Fax:  [phone]  Date:  11/09/2016   ID:  [name], [name] 1946-02-10, MRN [id number]  PCP:  Alesia Richards, MD   History of Present Illness: Michael Guzman is a 71 y.o. male with coronary artery disease 10/13 drug eluting stent to mid LAD and OM2, normal ejection fraction with recurrent chest discomfort, repeat catheterization reassuring. Prostate cancer.   Main complaint now seems to be fatigued, lack of energy especially after taking the Lupron injections for his prostate cancer he states. He does not have any more of these injections he is told his urologist. He is gone through radiation as well.  Constipation has been a problem for him in the past.  He won previous fishing tournament.   Lupron zapped energy. Feels bad. Radiation 10/20/16. May have gotten the flu. Sweats, hot.   Wife previously at [facility] for a prolonged period of time following mitral valve repair. Currently on dialysis.   Been dizzy at times. BP low. Trying to maintain a marginal blood pressure.  Right shoulder hard to lift arm in the past. Dr. Sharol Given with back and hip pain.   Has been to rhuem MD, hands lock around book.   He denies any chest pain, syncope, bleeding. He does occasionally have sweats.   Wt Readings from Last 3 Encounters:  11/09/16 179 lb 12.8 oz (81.6 kg)  10/16/16 192 lb 12.8 oz (87.5 kg)  10/08/16 188 lb 3.2 oz (85.4 kg)     Past Medical History:  Diagnosis Date  . Arthritis   . CAD (coronary artery disease)   . Chronic back pain   . Chronically dry eyes   . Diabetes mellitus without complication ([place])   . Elevated PSA    being monitored  . GERD (gastroesophageal reflux disease)   . Hypertension   . Prostate cancer [facility])     Past Surgical History:  Procedure Laterality Date  . APPENDECTOMY    . CARDIAC SURGERY     3 stent placed.  . COLONOSCOPY    . LEFT HEART  CATHETERIZATION WITH CORONARY ANGIOGRAM N/A 06/27/2012   Procedure: LEFT HEART CATHETERIZATION WITH CORONARY ANGIOGRAM;  Surgeon: Candee Furbish, MD;  Location: [facility] CATH LAB;  Service: Cardiovascular;  Laterality: N/A;  . LEFT HEART CATHETERIZATION WITH CORONARY ANGIOGRAM N/A 07/25/2012   Procedure: LEFT HEART CATHETERIZATION WITH CORONARY ANGIOGRAM;  Surgeon: Sueanne Margarita, MD;  Location: [place] CATH LAB;  Service: Cardiovascular;  Laterality: N/A;  . PROSTATE BIOPSY    . RADIOLOGY WITH ANESTHESIA N/A 12/03/2015   Procedure: MRI LUMBAR SPINE;  Surgeon: Medication Radiologist, MD;  Location: [place];  Service: Radiology;  Laterality: N/A;    Current Outpatient Prescriptions  Medication Sig Dispense Refill  . Artificial Tear Ointment (DRY EYES OP) Place 1 drop into both eyes daily as needed (for dry eyes).    Marland Kitchen aspirin 81 MG tablet Take 81 mg by mouth daily.    Marland Kitchen atorvastatin (LIPITOR) 20 MG tablet Take 1 tablet (20 mg total) by mouth daily. 90 tablet 2  . azelastine (ASTELIN) 0.1 % nasal spray Place 1 spray into both nostrils 2 (two) times daily. Use in each nostril as directed    . Blood Glucose Monitoring Suppl (ONE TOUCH ULTRA SYSTEM KIT) w/Device KIT Check blood sugar 1 time daily-DX-E11.22 1 each 0  . cetirizine (ZYRTEC) 10 MG tablet Take 1 tablet (10 mg total) by  mouth daily. 30 tablet 2  . Cholecalciferol (VITAMIN D3) 5000 UNITS CAPS Take 5,000 Units by mouth 2 (two) times daily.    . Cranberry 500 MG CAPS Take 500 mg by mouth daily.    . empagliflozin (JARDIANCE) 25 MG TABS tablet Take 25 mg by mouth daily. 90 tablet 1  . enalapril (VASOTEC) 2.5 MG tablet Take 1 tablet (2.5 mg total) by mouth daily. 90 tablet 3  . fluticasone (FLONASE) 50 MCG/ACT nasal spray Place 2 sprays into both nostrils daily. 16 g 0  . gabapentin (NEURONTIN) 600 MG tablet Take 1 tablet 3 x day as needed for pain 270 tablet 2  . glipiZIDE (GLUCOTROL) 5 MG tablet Take 1 tablet by mouth 3  times daily for glucose  over 200 or  as directed by  your doctor. 270 tablet 1  . glucose blood test strip Check blood sugar 1 time daily-DX-E11.22 100 each 4  . hydrocortisone (ANUSOL-HC) 25 MG suppository Place 25 mg rectally 2 (two) times daily as needed for hemorrhoids or itching.    Marland Kitchen levofloxacin (LEVAQUIN) 750 MG tablet Take 1 tablet (750 mg total) by mouth daily. X 7 days 7 tablet 0  . lidocaine (XYLOCAINE) 2 % solution Use as directed 20 mLs in the mouth or throat as needed for mouth pain. 100 mL 0  . lubiprostone (AMITIZA) 24 MCG capsule Take 1 capsule (24 mcg total) by mouth daily with breakfast. 90 capsule 0  . Magnesium 250 MG TABS Take 250 mg by mouth daily.    . megestrol (MEGACE) 20 MG tablet Take 20 mg by mouth 2 (two) times daily.    . metFORMIN (GLUCOPHAGE-XR) 500 MG 24 hr tablet TAKE 1 TO 2 TABLETS BY  MOUTH TWO TIMES DAILY AS  DIRECTED FOR DIABETES 360 tablet 0  . methocarbamol (ROBAXIN) 500 MG tablet Take 1 tablet (500 mg total) by mouth every 8 (eight) hours as needed for muscle spasms. 90 tablet 1  . metoprolol succinate (TOPROL-XL) 25 MG 24 hr tablet Take 0.5 tablets (12.5 mg total) by mouth daily. Take with or immediately following a meal. 45 tablet 3  . montelukast (SINGULAIR) 10 MG tablet Take 1 tablet (10 mg total) by mouth at bedtime. 90 tablet 3  . Multiple Vitamins-Minerals (MULTIVITAMIN ADULT PO) Take 1 tablet by mouth daily.    . nitroGLYCERIN (NITROSTAT) 0.4 MG SL tablet Place 1 tablet (0.4 mg total) under the tongue every 5 (five) minutes as needed for chest pain. 56 tablet 2  . Omega-3 Fatty Acids (FISH OIL) 1000 MG CAPS Take 1,000 mg by mouth daily.     . ondansetron (ZOFRAN ODT) 4 MG disintegrating tablet Take 1 tablet (4 mg total) by mouth every 8 (eight) hours as needed for nausea or vomiting. 20 tablet 0  . OVER THE COUNTER MEDICATION Delsum cough medicine    . pantoprazole (PROTONIX) 40 MG tablet TAKE 1 TABLET BY MOUTH DAILY 90 tablet 3  . phenazopyridine (PYRIDIUM) 95 MG tablet Take 95 mg by  mouth 3 (three) times daily as needed for pain.    . predniSONE (DELTASONE) 20 MG tablet 3 tabs po daily x 3 days, then 2 tabs x 3 days, then 1.5 tabs x 3 days, then 1 tab x 3 days, then 0.5 tabs x 3 days 27 tablet 0  . promethazine-dextromethorphan (PROMETHAZINE-DM) 6.25-15 MG/5ML syrup Take 5-10 ML PO q8hrs prn for cough 360 mL 1  . ranitidine (ZANTAC) 300 MG tablet Take 1 tablet (300 mg  total) by mouth 2 (two) times daily. 90 tablet 1  . tamsulosin (FLOMAX) 0.4 MG CAPS capsule Take 0.4 mg by mouth at bedtime.    Marland Kitchen tiZANidine (ZANAFLEX) 4 MG tablet Take 1 tablet (4 mg total) by mouth 3 (three) times daily. 90 tablet 1   No current facility-administered medications for this visit.     Allergies:    No Known Allergies  Social History:  The patient  reports that he quit smoking about 5 years ago. He quit after 50.00 years of use. He has never used smokeless tobacco. He reports that he drinks alcohol. He reports that he does not use drugs.   ROS:  Please see the history of present illness.   Denies any fevers, chills, orthopnea, PND. He feels sinus congestion, run down at times.    PHYSICAL EXAM: VS:  BP (!) 124/96   Pulse (!) 104   Ht 5' 10"  (1.778 m)   Wt 179 lb 12.8 oz (81.6 kg)   SpO2 93%   BMI 25.80 kg/m  Well nourished, well developed, in no acute distress  HEENT: normal  Neck: no JVD  Cardiac:  normal S1, S2; RRR; no murmur  Lungs:  clear to auscultation bilaterally, no wheezing, rhonchi or rales  Abd: soft, nontender, no hepatomegaly overweight. Ext: trace LE edema  Skin: warm and dry  Neuro: no focal abnormalities noted  EKG:   Today - 2/36/18 shows sinus tachycardia heart rate 104 with no other abnormalities. 05/07/15-sinus rhythm, 81, no other abnormalities  ASSESSMENT AND PLAN:  1. Coronary artery disease-post intervention to LAD, OM. Doing well. No anginal symptoms. I will continue with ASA. No evidence of bleeding. Main complaints seem to be centered around fatigue,  Lupron. Continue with activity.  2. Prostate cancer-many of his symptoms seem to be centered around he believes Lupron. 3. Constipation-Metamucil., Continue. Per primary physician. 4. Hypertension- Decreased metoprolol to 16m, enalapril 2.5 at prior visit. No changes made. 5. Diabetes with renal complication- trying to control. Hemoglobin A1c 8.6.  He is working hard on this currently. 6. Hyperlipidemia-LDL direct is 45 on last check. I decreased his atorvastatin to 20 mg at prior visit. Triglycerides have improved to 295 from 459. His LDL was 21.  7. Hypertension-quite low BP at prior visit. His Toprol was decreased at previous visit which likely is causing his slightly increased baseline heart rate 104 bpm. No changes made. 8. I will see him back in 12 months.  Signed, MCandee Furbish MD [facility] 11/09/2016 9:44 AM

## 2016-11-10 ENCOUNTER — Other Ambulatory Visit: Payer: Self-pay | Admitting: Internal Medicine

## 2016-11-23 NOTE — Progress Notes (Addendum)
Michael Guzman. Jansma 71 y.o. man with stage T1c adenocarcinoma of the prostate with a Gleason's score of 4+4 and a PSA of 7.95 radiation completed 10-21-16 one month FU.   Pain: He is currently not having pain.    URINARY: Reports urinary frequency,urinary urgency most days states he drinks a lot of water.  Denies hematuria..  Reports he is emptying of his bladder. Pt states he gets up to urinate 6-7   times per night. BOWEL: He reports a  bowel movement everyday, normal bowel movements. Fatigue:Having fatigue all during the day.  Tries to exercise doing knees bends. Appetite:Good Weight: Wt Readings from Last 3 Encounters:  12/02/16 187 lb 6.4 oz (85 kg)  11/09/16 179 lb 12.8 oz (81.6 kg)  10/16/16 192 lb 12.8 oz (87.5 kg)  Reports that his scrotum is very sore hard to touch. Urologist:Dr. Junious Silk will next Wednesday. Lupron injection every 6  months: Last  dose   06-04-16  Next 11-2016                                                           PSA: 7.95 ng/ml BP (!) 140/92   Pulse 88   Temp 98.2 F (36.8 C) (Oral)   Resp 18   Ht 5\' 10"  (1.778 m)   Wt 187 lb 6.4 oz (85 kg)   SpO2 99%   BMI 26.89 kg/m

## 2016-11-28 ENCOUNTER — Other Ambulatory Visit: Payer: Self-pay | Admitting: Internal Medicine

## 2016-11-28 DIAGNOSIS — J3089 Other allergic rhinitis: Secondary | ICD-10-CM

## 2016-12-02 ENCOUNTER — Ambulatory Visit
Admission: RE | Admit: 2016-12-02 | Discharge: 2016-12-02 | Disposition: A | Discharge status: Home / Self Care | Payer: Medicare Other | Source: Ambulatory Visit | Attending: Radiation Oncology | Admitting: Radiation Oncology

## 2016-12-02 ENCOUNTER — Encounter: Payer: Self-pay | Admitting: Radiation Oncology

## 2016-12-02 ENCOUNTER — Telehealth: Payer: Self-pay | Admitting: Internal Medicine

## 2016-12-02 ENCOUNTER — Other Ambulatory Visit: Payer: Self-pay | Admitting: Internal Medicine

## 2016-12-02 VITALS — BP 140/92 | HR 88 | Temp 98.2°F | Resp 18 | Ht 70.0 in | Wt 187.4 lb

## 2016-12-02 DIAGNOSIS — Z923 Personal history of irradiation: Secondary | ICD-10-CM | POA: Insufficient documentation

## 2016-12-02 DIAGNOSIS — C61 Malignant neoplasm of prostate: Secondary | ICD-10-CM

## 2016-12-02 DIAGNOSIS — R350 Frequency of micturition: Secondary | ICD-10-CM | POA: Insufficient documentation

## 2016-12-02 DIAGNOSIS — Z79899 Other long term (current) drug therapy: Secondary | ICD-10-CM | POA: Diagnosis not present

## 2016-12-02 DIAGNOSIS — Z7982 Long term (current) use of aspirin: Secondary | ICD-10-CM | POA: Diagnosis not present

## 2016-12-02 DIAGNOSIS — Z7984 Long term (current) use of oral hypoglycemic drugs: Secondary | ICD-10-CM | POA: Diagnosis not present

## 2016-12-02 NOTE — Progress Notes (Signed)
Radiation Oncology         [id number]) [phone] ________________________________  Name: Michael Guzman MRN: [id number]  Date: 12/02/2016  DOB: Jun 06, 1946  Post Treatment Note  CC: Alesia Richards, MD  [name], MD  Diagnosis:   Stage T1c adenocarcinoma of the prostate with a Gleason's score of 4+4 and a PSA of 7.95  Interval Since Last Radiation:  6 weeks   08/24/16 - 10/21/16:  The prostate was treated to 75 Gy in 40 fractions of 1.8 Gy for 25 fractions and a boost of 2 Gy for 15 fractions.  Narrative:  The patient returns today for routine follow-up.  He tolerated radiation treatment relatively well with only minor urinary irritation and modest fatigue. He did experience dysuria, fatigue, nocturia 3-4 per night, diarrhea and hot flashes from Lupron.                             On review of systems, the patient states he's still waking up every 1-2 hours at night due to nocturia. He has been taking flomax intermittently as well and has taken the 0.4 mg tablets. He reports insatiable thirst, urinary frequency, and incomplete emptying, and reports he is so fatigued and tired that he is contemplating discontinuation of lupron. He plans to see Dr. Junious Silk next week. No other complaints are verbalized.  ALLERGIES:  has No Known Allergies.  Meds: Current Outpatient Prescriptions  Medication Sig Dispense Refill  . Artificial Tear Ointment (DRY EYES OP) Place 1 drop into both eyes daily as needed (for dry eyes).    Marland Kitchen aspirin 81 MG tablet Take 81 mg by mouth daily.    Marland Kitchen azelastine (ASTELIN) 0.1 % nasal spray Place 1 spray into both nostrils 2 (two) times daily. Use in each nostril as directed    . Blood Glucose Monitoring Suppl (ONE TOUCH ULTRA SYSTEM KIT) w/Device KIT Check blood sugar 1 time daily-DX-E11.22 1 each 0  . Cholecalciferol (VITAMIN D3) 5000 UNITS CAPS Take 5,000 Units by mouth 2 (two) times daily.    . empagliflozin (JARDIANCE) 25 MG TABS tablet Take 25 mg by mouth  daily. 90 tablet 1  . enalapril (VASOTEC) 2.5 MG tablet Take 1 tablet (2.5 mg total) by mouth daily. 90 tablet 3  . gabapentin (NEURONTIN) 600 MG tablet Take 1 tablet 3 x day as needed for pain 270 tablet 2  . glipiZIDE (GLUCOTROL) 5 MG tablet Take 1 tablet by mouth 3  times daily for glucose  over 200 or as directed by  your doctor. 270 tablet 1  . glucose blood test strip Check blood sugar 1 time daily-DX-E11.22 100 each 4  . lubiprostone (AMITIZA) 24 MCG capsule Take 1 capsule (24 mcg total) by mouth daily with breakfast. 90 capsule 0  . Magnesium 250 MG TABS Take 250 mg by mouth daily.    . metFORMIN (GLUCOPHAGE-XR) 500 MG 24 hr tablet TAKE 1 TO 2 TABLETS BY  MOUTH TWO TIMES DAILY AS  DIRECTED FOR DIABETES 360 tablet 0  . metoprolol succinate (TOPROL-XL) 25 MG 24 hr tablet Take 0.5 tablets (12.5 mg total) by mouth daily. Take with or immediately following a meal. 45 tablet 3  . montelukast (SINGULAIR) 10 MG tablet TAKE 1 TABLET BY MOUTH AT  BEDTIME 90 tablet 1  . Multiple Vitamins-Minerals (MULTIVITAMIN ADULT PO) Take 1 tablet by mouth daily.    Marland Kitchen OVER THE COUNTER MEDICATION Delsum cough medicine    . pantoprazole (  PROTONIX) 40 MG tablet TAKE 1 TABLET BY MOUTH  DAILY 90 tablet 1  . phenazopyridine (PYRIDIUM) 95 MG tablet Take 95 mg by mouth 3 (three) times daily as needed for pain.    . ranitidine (ZANTAC) 300 MG tablet Take 1 tablet (300 mg total) by mouth 2 (two) times daily. 90 tablet 1  . tamsulosin (FLOMAX) 0.4 MG CAPS capsule Take 0.4 mg by mouth at bedtime.    Marland Kitchen atorvastatin (LIPITOR) 20 MG tablet Take 1 tablet (20 mg total) by mouth daily. 90 tablet 2  . cetirizine (ZYRTEC) 10 MG tablet Take 1 tablet (10 mg total) by mouth daily. (Patient not taking: Reported on 12/02/2016) 30 tablet 2  . Cranberry 500 MG CAPS Take 500 mg by mouth daily.    . fluticasone (FLONASE) 50 MCG/ACT nasal spray Place 2 sprays into both nostrils daily. (Patient not taking: Reported on 12/02/2016) 16 g 0  .  hydrocortisone (ANUSOL-HC) 25 MG suppository Place 25 mg rectally 2 (two) times daily as needed for hemorrhoids or itching.    . lidocaine (XYLOCAINE) 2 % solution Use as directed 20 mLs in the mouth or throat as needed for mouth pain. (Patient not taking: Reported on 12/02/2016) 100 mL 0  . megestrol (MEGACE) 20 MG tablet Take 20 mg by mouth 2 (two) times daily.    . methocarbamol (ROBAXIN) 500 MG tablet Take 1 tablet (500 mg total) by mouth every 8 (eight) hours as needed for muscle spasms. (Patient not taking: Reported on 12/02/2016) 90 tablet 1  . nitroGLYCERIN (NITROSTAT) 0.4 MG SL tablet Place 1 tablet (0.4 mg total) under the tongue every 5 (five) minutes as needed for chest pain. (Patient not taking: Reported on 12/02/2016) 56 tablet 2  . Omega-3 Fatty Acids (FISH OIL) 1000 MG CAPS Take 1,000 mg by mouth daily.     . ondansetron (ZOFRAN ODT) 4 MG disintegrating tablet Take 1 tablet (4 mg total) by mouth every 8 (eight) hours as needed for nausea or vomiting. (Patient not taking: Reported on 12/02/2016) 20 tablet 0  . predniSONE (DELTASONE) 20 MG tablet 3 tabs po daily x 3 days, then 2 tabs x 3 days, then 1.5 tabs x 3 days, then 1 tab x 3 days, then 0.5 tabs x 3 days (Patient not taking: Reported on 12/02/2016) 27 tablet 0  . promethazine-dextromethorphan (PROMETHAZINE-DM) 6.25-15 MG/5ML syrup Take 5-10 ML PO q8hrs prn for cough (Patient not taking: Reported on 12/02/2016) 360 mL 1  . tiZANidine (ZANAFLEX) 4 MG tablet Take 1 tablet (4 mg total) by mouth 3 (three) times daily. (Patient not taking: Reported on 12/02/2016) 90 tablet 1   No current facility-administered medications for this encounter.     Physical Findings:  height is '5\' 10"'$  (1.778 m) and weight is 187 lb 6.4 oz (85 kg). His oral temperature is 98.2 F (36.8 C). His blood pressure is 140/92 (abnormal) and his pulse is 88. His respiration is 18 and oxygen saturation is 99%.  Pain Assessment Pain Score: 0-No pain/10 In general this is a  well appearing African American male in no acute distress. He's alert and oriented x4 and appropriate throughout the examination. Cardiopulmonary assessment is negative for acute distress and he exhibits normal effort.   Lab Findings: Lab Results  Component Value Date   WBC 9.2 10/20/2016   HGB 11.8 (L) 10/20/2016   HCT 36.4 (L) 10/20/2016   MCV 90.5 10/20/2016   PLT 248 10/20/2016     Radiographic Findings: No results  found.  Impression/Plan: 1. Stage T1c adenocarcinoma of the prostate with a Gleason's score of 4+4 and a PSA of  7.95. He will continue to follow up with urology for ongoing PSA determinations and has an appointment scheduled with Dr. Junious Silk next week. He understands what to expect with regards to PSA monitoring going forward. I will look forward to following his response to correspondence with urology, and would be happy to continue to participate in his care if clinically indicated. I talked to the patient about what to expect in the future, including his risk for erectile dysfunction rectal bleeding. I encouraged him to call or return to the office if he has any questions regarding his previous radiation or possible radiation side effects. He was comfortable with this plan and will follow up as needed. 2. Excessive thirst and urinary frequency. We discussed the use of flomax two tabs po qhs. His prostate gland size is not significantly large, and I'm concerned that he may have diabetes insipidus. I've made a call into his PCP to discuss this further.   Carola Rhine, PAC   Addendum: I've spoken with the patient's PCP Loma Sousa and she will contact the patient to work him up for diabetes insipidus and refer to endocrinology.

## 2016-12-02 NOTE — Telephone Encounter (Signed)
Ebony Hail called as Michael Guzman stopped her in the hallway at one of his treatments and described overwhelming nighttime thirst and also excessive urination. She reports that she feels he may need to be checked for diabetes insipidus.  We will have him come into the office for an appointment and labs here.

## 2016-12-03 ENCOUNTER — Ambulatory Visit (INDEPENDENT_AMBULATORY_CARE_PROVIDER_SITE_OTHER): Payer: Medicare Other | Admitting: Internal Medicine

## 2016-12-03 ENCOUNTER — Ambulatory Visit (HOSPITAL_COMMUNITY)
Admission: RE | Admit: 2016-12-03 | Discharge: 2016-12-03 | Disposition: A | Discharge status: Home / Self Care | Payer: Medicare Other | Source: Ambulatory Visit | Attending: Internal Medicine | Admitting: Internal Medicine

## 2016-12-03 ENCOUNTER — Encounter: Payer: Self-pay | Admitting: Internal Medicine

## 2016-12-03 VITALS — BP 104/58 | HR 96 | Temp 98.0°F | Resp 16 | Ht 68.5 in | Wt 184.0 lb

## 2016-12-03 DIAGNOSIS — E1165 Type 2 diabetes mellitus with hyperglycemia: Secondary | ICD-10-CM | POA: Diagnosis not present

## 2016-12-03 DIAGNOSIS — R5383 Other fatigue: Secondary | ICD-10-CM | POA: Diagnosis not present

## 2016-12-03 DIAGNOSIS — R0602 Shortness of breath: Secondary | ICD-10-CM | POA: Diagnosis not present

## 2016-12-03 DIAGNOSIS — I700 Atherosclerosis of aorta: Secondary | ICD-10-CM | POA: Insufficient documentation

## 2016-12-03 DIAGNOSIS — Z79899 Other long term (current) drug therapy: Secondary | ICD-10-CM | POA: Diagnosis not present

## 2016-12-03 DIAGNOSIS — R631 Polydipsia: Secondary | ICD-10-CM | POA: Diagnosis not present

## 2016-12-03 LAB — BASIC METABOLIC PANEL WITH GFR
BUN: 20 mg/dL (ref 7–25)
CHLORIDE: 109 mmol/L (ref 98–110)
CO2: 17 mmol/L — ABNORMAL LOW (ref 20–31)
CREATININE: 1.04 mg/dL (ref 0.70–1.25)
Calcium: 9.4 mg/dL (ref 8.6–10.3)
GFR, Est African American: 84 mL/min (ref >=60)
GFR, Est Non African American: 73 mL/min (ref >=60)
Glucose, Bld: 327 mg/dL — ABNORMAL HIGH (ref 65–99)
POTASSIUM: 4.2 mmol/L (ref 3.5–5.3)
SODIUM: 137 mmol/L (ref 135–146)

## 2016-12-03 LAB — CBC WITH DIFFERENTIAL/PLATELET
Basophils Absolute: 44 cells/uL (ref 0–200)
Basophils Relative: 1 %
EOS PCT: 4 %
Eosinophils Absolute: 176 cells/uL (ref 15–500)
HCT: 34.5 % — ABNORMAL LOW (ref 38.5–50.0)
Hemoglobin: 10.9 g/dL — ABNORMAL LOW (ref 13.2–17.1)
Lymphocytes Relative: 12 %
Lymphs Abs: 528 cells/uL — ABNORMAL LOW (ref 850–3900)
MCH: 31.1 pg (ref 27.0–33.0)
MCHC: 31.6 g/dL — ABNORMAL LOW (ref 32.0–36.0)
MCV: 98.3 fL (ref 80.0–100.0)
MONOS PCT: 10 %
MPV: 11.2 fL (ref 7.5–12.5)
Monocytes Absolute: 440 cells/uL (ref 200–950)
NEUTROS ABS: 3212 {cells}/uL (ref 1500–7800)
Neutrophils Relative %: 73 %
PLATELETS: 175 10*3/uL (ref 140–400)
RBC: 3.51 MIL/uL — ABNORMAL LOW (ref 4.20–5.80)
RDW: 15.2 % — ABNORMAL HIGH (ref 11.0–15.0)
WBC: 4.4 10*3/uL (ref 3.8–10.8)

## 2016-12-03 LAB — AMMONIA: Ammonia: 25 umol/L (ref <=47)

## 2016-12-03 LAB — HEPATIC FUNCTION PANEL
ALT: 15 U/L (ref 9–46)
AST: 13 U/L (ref 10–35)
Albumin: 4.3 g/dL (ref 3.6–5.1)
Alkaline Phosphatase: 58 U/L (ref 40–115)
BILIRUBIN DIRECT: 0.1 mg/dL (ref <=0.2)
BILIRUBIN TOTAL: 0.5 mg/dL (ref 0.2–1.2)
Indirect Bilirubin: 0.4 mg/dL (ref 0.2–1.2)
Total Protein: 6.5 g/dL (ref 6.1–8.1)

## 2016-12-03 LAB — TSH: TSH: 0.39 m[IU]/L — AB (ref 0.40–4.50)

## 2016-12-03 NOTE — Progress Notes (Signed)
Assessment and Plan:  Patient presents to the office at my urging after I spoke with Michael Flank PA-C.  Patient is having worsening polydipsia and fatigue even after having completed his radiation therapy for prostate cancer.  He is actively short of breath in my office after having to put his shoe back on.  He reports that he has good days and bad days.  He reported that his son had to come over today as he could not even get himself up today.  Michael Guzman asked me to test for Diabetes Insipidus due to the severe polydipsia but both serum and urine osmolality seem to rule this out.  I have ordered PFTs to determine whether the SOB is due to underlying lung issue.  No evidence of SIADH.  A1c level does appear to be improving.  I will send messages to both Michael Guzman and also to Michael Guzman with lab results and concerns that I have for the patient.  I spoke with Michael Guzman about Michael Guzman as well and he suggests that we also test for Myasthenia gravis and discontinue medications which may contribute to dry mouth.  Will initiate some of the medication changes and bring him in for myasthenia gravis testing.    1. Fatigue, unspecified type -cont levothyroxine - TSH  2. Shortness of breath  - Brain natriuretic peptide - DG Chest 2 View; Future - Pulmonary function test; Future  3. Excessive thirst  - Urinalysis, Routine w reflex microscopic - Sodium, Urine - Osmolality, Urine - Osmolality, Serum  4. Medication management  - CBC with Differential/Platelet - BASIC METABOLIC PANEL WITH GFR - Hepatic function panel  5. Uncontrolled type 2 diabetes mellitus with hyperglycemia, without long-term current use of insulin (HCC)  - Hemoglobin A1c    HPI 70 y.o.male presents for follow-up of feeling really bad.  He spoke with Michael Guzman the Radiation Oncology PA.  He reports that he has finished his radiation.  He reports that he is getting more fatigued and feels terrible.  He reports that  he is constantly thirsty and he is peeing at nighttime all the time.  He reports that he can do well for a day or two and then he has to spend an entire day in bed.  He reports that he is tired during the day because he is up so often during the night. He reports that he could constantly drink all day and he has really severe cotton mouth.  He reports that he has been having an awful day.  He fell asleep and he couldn't get out of the chair at all.  He reports that he ate a bolonga sandwich at about noon.  He reports that he feels like there is something going on his head because he is having chronic congestion and nose bleeds.  He reports that he has been having bilateral foot pain and leg pain and had a rash along with it.  He reports that he wearing knee high socks.  He reports that he has a rash on his bilateral sides and bilateral legs.  He reports that this went on for 4-5 days.  He has been checking his blood sugars and they haven't been good.  He reports that he has been having blood sugars running somewhere around 230s.  He has had some readings as high 300.  He reports that he has not been good with his diet either.   He has been having a lot of hunger as well.  He has been having worsening shortness of breath.  He feels like his chest is really tight.  He has not had leg swelling or abdominal swelling.  No SOB at bedtime.  No CP or palpitations.  He is having some lightheadedness and dizziness.    Past Medical History:  Diagnosis Date  . Arthritis   . CAD (coronary artery disease)   . Chronic back pain   . Chronically dry eyes   . Diabetes mellitus without complication ([place])   . Elevated PSA    being monitored  . GERD (gastroesophageal reflux disease)   . Hypertension   . Prostate cancer ([place])      No Known Allergies    Current Outpatient Prescriptions on File Prior to Visit  Medication Sig Dispense Refill  . Artificial Tear Ointment (DRY EYES OP) Place 1 drop into both eyes daily as  needed (for dry eyes).    Marland Kitchen aspirin 81 MG tablet Take 81 mg by mouth daily.    Marland Kitchen azelastine (ASTELIN) 0.1 % nasal spray Place 1 spray into both nostrils 2 (two) times daily. Use in each nostril as directed    . Blood Glucose Monitoring Suppl (ONE TOUCH ULTRA SYSTEM KIT) w/Device KIT Check blood sugar 1 time daily-DX-E11.22 1 each 0  . cetirizine (ZYRTEC) 10 MG tablet Take 1 tablet (10 mg total) by mouth daily. 30 tablet 2  . Cholecalciferol (VITAMIN D3) 5000 UNITS CAPS Take 5,000 Units by mouth 2 (two) times daily.    . empagliflozin (JARDIANCE) 25 MG TABS tablet Take 25 mg by mouth daily. 90 tablet 1  . enalapril (VASOTEC) 2.5 MG tablet Take 1 tablet (2.5 mg total) by mouth daily. 90 tablet 3  . fluticasone (FLONASE) 50 MCG/ACT nasal spray Place 2 sprays into both nostrils daily. 16 g 0  . gabapentin (NEURONTIN) 600 MG tablet Take 1 tablet 3 x day as needed for pain 270 tablet 2  . glipiZIDE (GLUCOTROL) 5 MG tablet Take 1 tablet by mouth 3  times daily for glucose  over 200 or as directed by  your doctor. 270 tablet 1  . glucose blood test strip Check blood sugar 1 time daily-DX-E11.22 100 each 4  . hydrocortisone (ANUSOL-HC) 25 MG suppository Place 25 mg rectally 2 (two) times daily as needed for hemorrhoids or itching.    . lidocaine (XYLOCAINE) 2 % solution Use as directed 20 mLs in the mouth or throat as needed for mouth pain. 100 mL 0  . Magnesium 250 MG TABS Take 250 mg by mouth daily.    . megestrol (MEGACE) 20 MG tablet Take 20 mg by mouth 2 (two) times daily.    . metFORMIN (GLUCOPHAGE-XR) 500 MG 24 hr tablet TAKE 1 TO 2 TABLETS BY  MOUTH TWO TIMES DAILY AS  DIRECTED FOR DIABETES 360 tablet 0  . methocarbamol (ROBAXIN) 500 MG tablet Take 1 tablet (500 mg total) by mouth every 8 (eight) hours as needed for muscle spasms. 90 tablet 1  . metoprolol succinate (TOPROL-XL) 25 MG 24 hr tablet Take 0.5 tablets (12.5 mg total) by mouth daily. Take with or immediately following a meal. 45 tablet 3   . montelukast (SINGULAIR) 10 MG tablet TAKE 1 TABLET BY MOUTH AT  BEDTIME 90 tablet 1  . Multiple Vitamins-Minerals (MULTIVITAMIN ADULT PO) Take 1 tablet by mouth daily.    . nitroGLYCERIN (NITROSTAT) 0.4 MG SL tablet Place 1 tablet (0.4 mg total) under the tongue every 5 (five) minutes as needed for chest  pain. 56 tablet 2  . Omega-3 Fatty Acids (FISH OIL) 1000 MG CAPS Take 1,000 mg by mouth daily.     . ondansetron (ZOFRAN ODT) 4 MG disintegrating tablet Take 1 tablet (4 mg total) by mouth every 8 (eight) hours as needed for nausea or vomiting. 20 tablet 0  . OVER THE COUNTER MEDICATION Delsum cough medicine    . pantoprazole (PROTONIX) 40 MG tablet TAKE 1 TABLET BY MOUTH  DAILY 90 tablet 1  . phenazopyridine (PYRIDIUM) 95 MG tablet Take 95 mg by mouth 3 (three) times daily as needed for pain.    . ranitidine (ZANTAC) 300 MG tablet Take 1 tablet (300 mg total) by mouth 2 (two) times daily. 90 tablet 1  . tamsulosin (FLOMAX) 0.4 MG CAPS capsule Take 0.4 mg by mouth at bedtime.    Marland Kitchen tiZANidine (ZANAFLEX) 4 MG tablet Take 1 tablet (4 mg total) by mouth 3 (three) times daily. 90 tablet 1  . atorvastatin (LIPITOR) 20 MG tablet Take 1 tablet (20 mg total) by mouth daily. 90 tablet 2   No current facility-administered medications on file prior to visit.     Review of Systems  Constitutional: Positive for malaise/fatigue. Negative for chills, diaphoresis, fever and weight loss.  HENT: Positive for congestion and sinus pain. Negative for sore throat.   Eyes: Negative.   Respiratory: Positive for shortness of breath. Negative for cough, hemoptysis, sputum production and wheezing.   Cardiovascular: Negative for chest pain, palpitations, orthopnea, claudication, leg swelling and PND.  Gastrointestinal: Negative for abdominal pain, blood in stool, constipation, diarrhea, heartburn, melena, nausea and vomiting.  Genitourinary: Positive for frequency and urgency. Negative for dysuria, Guzman pain and  hematuria.  Skin: Negative.   Neurological: Positive for dizziness and weakness. Negative for tremors, sensory change, focal weakness, seizures and loss of consciousness.  Endo/Heme/Allergies: Positive for polydipsia.  Psychiatric/Behavioral: Negative for depression and substance abuse. The patient has insomnia. The patient is not nervous/anxious.      Physical Exam: Filed Weights   12/03/16 1430  Weight: 184 lb (83.5 kg)   Wt Readings from Last 3 Encounters:  12/03/16 184 lb (83.5 kg)  12/02/16 187 lb 6.4 oz (85 kg)  11/09/16 179 lb 12.8 oz (81.6 kg)    BP (!) 104/58   Pulse 96   Temp 98 F (36.7 C) (Temporal)   Resp 16   Ht 5' 8.5" (1.74 m)   Wt 184 lb (83.5 kg)   BMI 27.57 kg/m  General Appearance: Fatigued and chronically ill appearing.  Well developed well nourished, non-toxic appearing in no apparent distress. Eyes: PERRLA, EOMs, conjunctiva w/ no swelling or erythema or discharge Sinuses: mild frontal sinus TTP ENT/Mouth: Ear canals clear without swelling or erythema.  TM's normal bilaterally with no retractions, bulging, or loss of landmarks.   Neck: Supple, thyroid normal, no notable JVD  Respiratory: Respiratory effort increased, Clear breath sounds anteriorly and posteriorly bilaterally with diminished breath sounds at the bilateral bases.  There is no appreciable wheeze, rhonchi, or rale. No retractions or accessory muscle usage. Cardio: RRR with no MRGs.  No peripheral edema Abdomen: Soft, + BS.  Non tender, no guarding, rebound, hernias, masses.  Musculoskeletal: Full ROM, 5/5 strength, normal gait.  Skin: Warm, dry without rashes  Neuro: Awake and oriented X 3, Cranial nerves intact. Normal muscle tone, no cerebellar symptoms. Sensation intact.  Psych: Flat affect, Insight and Judgment appropriate.     Starlyn Skeans, PA-C 2:40 PM [facility]

## 2016-12-04 ENCOUNTER — Other Ambulatory Visit: Payer: Self-pay | Admitting: Physician Assistant

## 2016-12-04 ENCOUNTER — Other Ambulatory Visit: Payer: Self-pay | Admitting: Internal Medicine

## 2016-12-04 DIAGNOSIS — E119 Type 2 diabetes mellitus without complications: Secondary | ICD-10-CM

## 2016-12-04 DIAGNOSIS — M150 Primary generalized (osteo)arthritis: Principal | ICD-10-CM

## 2016-12-04 DIAGNOSIS — M8949 Other hypertrophic osteoarthropathy, multiple sites: Secondary | ICD-10-CM

## 2016-12-04 DIAGNOSIS — M159 Polyosteoarthritis, unspecified: Secondary | ICD-10-CM

## 2016-12-04 LAB — URINALYSIS, ROUTINE W REFLEX MICROSCOPIC
BILIRUBIN URINE: NEGATIVE
Hgb urine dipstick: NEGATIVE
Leukocytes, UA: NEGATIVE
Nitrite: NEGATIVE
Protein, ur: NEGATIVE
SPECIFIC GRAVITY, URINE: 1.039 — AB (ref 1.001–1.035)
pH: 5 (ref 5.0–8.0)

## 2016-12-04 LAB — URINALYSIS, MICROSCOPIC ONLY
Bacteria, UA: NONE SEEN [HPF]
Casts: NONE SEEN [LPF]
Crystals: NONE SEEN [HPF]
RBC / HPF: NONE SEEN RBC/HPF (ref <=2)
Squamous Epithelial / LPF: NONE SEEN [HPF] (ref <=5)
WBC UA: NONE SEEN WBC/HPF (ref <=5)
Yeast: NONE SEEN [HPF]

## 2016-12-04 LAB — HEMOGLOBIN A1C
Hgb A1c MFr Bld: 7.4 % — ABNORMAL HIGH (ref <5.7)
MEAN PLASMA GLUCOSE: 166 mg/dL

## 2016-12-04 LAB — BRAIN NATRIURETIC PEPTIDE: BRAIN NATRIURETIC PEPTIDE: 4.8 pg/mL (ref <100)

## 2016-12-04 LAB — OSMOLALITY, URINE: Osmolality, Ur: 794 mosm/kg (ref 50–1200)

## 2016-12-04 LAB — SODIUM, URINE, RANDOM: Sodium, Ur: 60 mmol/L (ref 28–272)

## 2016-12-04 LAB — OSMOLALITY: Osmolality: 304 mosm/kg (ref 278–305)

## 2016-12-05 ENCOUNTER — Encounter: Payer: Self-pay | Admitting: Internal Medicine

## 2016-12-06 ENCOUNTER — Other Ambulatory Visit: Payer: Self-pay | Admitting: Internal Medicine

## 2016-12-07 ENCOUNTER — Encounter: Payer: Self-pay | Admitting: Internal Medicine

## 2016-12-09 DIAGNOSIS — C61 Malignant neoplasm of prostate: Secondary | ICD-10-CM | POA: Diagnosis not present

## 2016-12-17 DIAGNOSIS — M1611 Unilateral primary osteoarthritis, right hip: Secondary | ICD-10-CM | POA: Diagnosis not present

## 2016-12-18 ENCOUNTER — Other Ambulatory Visit: Payer: Self-pay | Admitting: Physician Assistant

## 2016-12-20 IMAGING — NM NM MISC PROCEDURE
3 series · 18 of 18 positions shown · non-contrast
Comparison: none

[Series 1: wbr_s-proj_st stress_(id)_sa · 6.5mm · 6.51mm/px · 6 of 512 frames shown (1 of 2)]
[frame 43/512]
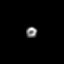
[frame 128/512]
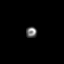
[frame 214/512]
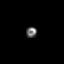
[frame 299/512]
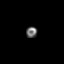
[frame 384/512]
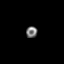
[frame 470/512]
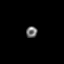

[Series 1: wbr_s-proj_st stress_(id)_sa · 6.5mm · 6.51mm/px · 6 of 64 frames shown (2 of 2)]
[frame 6/64]
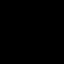
[frame 16/64]
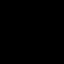
[frame 27/64]
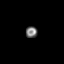
[frame 38/64]
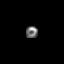
[frame 48/64]
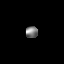
[frame 59/64]
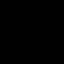

[Series 1: wbr_r-proj_st rest_(id)_sa · 6.5mm · 6.51mm/px · 6 of 64 frames shown]
[frame 6/64]
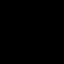
[frame 16/64]
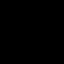
[frame 27/64]
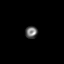
[frame 38/64]
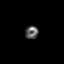
[frame 48/64]
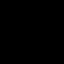
[frame 59/64]
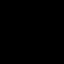

[18 of 18 positions shown; findings below may reference images not displayed]

Canned report from images found in remote index.

Refer to host system for actual result text.

## 2016-12-21 ENCOUNTER — Encounter: Payer: Self-pay | Admitting: Internal Medicine

## 2016-12-31 ENCOUNTER — Encounter: Payer: Self-pay | Admitting: Internal Medicine

## 2017-01-01 ENCOUNTER — Encounter: Payer: Self-pay | Admitting: Internal Medicine

## 2017-01-01 ENCOUNTER — Ambulatory Visit (INDEPENDENT_AMBULATORY_CARE_PROVIDER_SITE_OTHER): Payer: Medicare Other | Admitting: Internal Medicine

## 2017-01-01 VITALS — BP 116/80 | HR 88 | Temp 97.2°F | Resp 16 | Ht 69.0 in | Wt 183.4 lb

## 2017-01-01 DIAGNOSIS — E1122 Type 2 diabetes mellitus with diabetic chronic kidney disease: Secondary | ICD-10-CM | POA: Diagnosis not present

## 2017-01-01 DIAGNOSIS — R5383 Other fatigue: Secondary | ICD-10-CM | POA: Diagnosis not present

## 2017-01-01 DIAGNOSIS — R300 Dysuria: Secondary | ICD-10-CM

## 2017-01-01 DIAGNOSIS — N182 Chronic kidney disease, stage 2 (mild): Secondary | ICD-10-CM

## 2017-01-01 DIAGNOSIS — I10 Essential (primary) hypertension: Secondary | ICD-10-CM | POA: Diagnosis not present

## 2017-01-01 DIAGNOSIS — Z79899 Other long term (current) drug therapy: Secondary | ICD-10-CM | POA: Diagnosis not present

## 2017-01-01 LAB — CBC WITH DIFFERENTIAL/PLATELET
Basophils Absolute: 58 cells/uL (ref 0–200)
Basophils Relative: 1 %
Eosinophils Absolute: 174 cells/uL (ref 15–500)
Eosinophils Relative: 3 %
HCT: 42.2 % (ref 38.5–50.0)
Hemoglobin: 13.7 g/dL (ref 13.2–17.1)
Lymphocytes Relative: 10 %
Lymphs Abs: 580 cells/uL — ABNORMAL LOW (ref 850–3900)
MCH: 31.6 pg (ref 27.0–33.0)
MCHC: 32.5 g/dL (ref 32.0–36.0)
MCV: 97.5 fL (ref 80.0–100.0)
MPV: 11.2 fL (ref 7.5–12.5)
Monocytes Absolute: 522 cells/uL (ref 200–950)
Monocytes Relative: 9 %
Neutro Abs: 4466 cells/uL (ref 1500–7800)
Neutrophils Relative %: 77 %
Platelets: 195 10*3/uL (ref 140–400)
RBC: 4.33 MIL/uL (ref 4.20–5.80)
RDW: 13.3 % (ref 11.0–15.0)
WBC: 5.8 10*3/uL (ref 3.8–10.8)

## 2017-01-01 LAB — HEPATIC FUNCTION PANEL
ALBUMIN: 4.8 g/dL (ref 3.6–5.1)
ALK PHOS: 84 U/L (ref 40–115)
ALT: 18 U/L (ref 9–46)
AST: 14 U/L (ref 10–35)
Bilirubin, Direct: 0.1 mg/dL (ref <=0.2)
Indirect Bilirubin: 0.3 mg/dL (ref 0.2–1.2)
TOTAL PROTEIN: 7.5 g/dL (ref 6.1–8.1)
Total Bilirubin: 0.4 mg/dL (ref 0.2–1.2)

## 2017-01-01 LAB — BASIC METABOLIC PANEL WITH GFR
BUN: 26 mg/dL — AB (ref 7–25)
CHLORIDE: 102 mmol/L (ref 98–110)
CO2: 18 mmol/L — ABNORMAL LOW (ref 20–31)
CREATININE: 1.18 mg/dL (ref 0.70–1.25)
Calcium: 10.1 mg/dL (ref 8.6–10.3)
GFR, Est African American: 72 mL/min (ref >=60)
GFR, Est Non African American: 63 mL/min (ref >=60)
GLUCOSE: 262 mg/dL — AB (ref 65–99)
POTASSIUM: 4.4 mmol/L (ref 3.5–5.3)
Sodium: 135 mmol/L (ref 135–146)

## 2017-01-01 NOTE — Progress Notes (Signed)
Subjective:    Patient ID: Michael Guzman, male    DOB: April 07, 1946, 71 y.o.   MRN: [id number]  HPI   This nice 71 yo MBM with HTN, HLD, ASCAD/PCA/Stents, T2_NIDDM, Vit D Def was initially dx'd with Ca Prostate - Gleason 6 in Feb 2016 and at re-bx in 04/2016 was re-staged at Gleason 8 and in Sept began ext RT thru Feb 8. He has received Lupron 9/22 and 3/28 and experienced "sweats", excessive thirst and fatigue. Diabetic control was been poor with A1c 8.6% and most recent 7.4%.      His Hgb has been dropping from 14.1 He gm% in Oct to 11.8 gm% in Oct 16, 2016 to most recent 10.9 gm% on 12/03/16. Patient c/o fatigue. Denies upper Gi sx's, melena/hematochezia.   Medication Sig  . Artificial Tear Ointment (DRY EYES OP) Place 1 drop into both eyes daily as needed (for dry eyes).  Marland Kitchen aspirin 81 MG tablet Take 81 mg by mouth daily.  . Blood Glucose Monitoring Suppl (ONE TOUCH ULTRA SYSTEM KIT) w/Device KIT Check blood sugar 1 time daily-DX-E11.22  . Cholecalciferol (VITAMIN D3) 5000 UNITS CAPS Take 5,000 Units by mouth 2 (two) times daily.  . empagliflozin (JARDIANCE) 25 MG TABS tablet Take 25 mg by mouth daily.  . enalapril (VASOTEC) 2.5 MG tablet Take 1 tablet (2.5 mg total) by mouth daily.  Marland Kitchen FLONASE nasal spray Place 2 sprays into both nostrils daily.  Marland Kitchen gabapentin  600 MG tablet TAKE 1 TABLET 3 TIMES A DAY AS NEEDED FOR PAIN  . glipiZIDE 5 MG tablet Take 1 tablet by mouth 3  times daily for glucose  over 200 or as directed by  your doctor.  . ANUSOL-HC 25 MG supp Place 25 mg rectally 2 (two) times daily as needed for hemorrhoids or itching.  . Magnesium 250 MG TABS Take 250 mg by mouth daily.  . megestrol  20 MG tablet Take 20 mg by mouth 2 (two) times daily.  . metFORMIN-XR 500 MG  TAKE 1 TO 2 TABLETS BY  MOUTH TWO TIMES DAILY AS  DIRECTED FOR DIABETES  . metoprolol succ-XL 25 MG  Take 0.5 tablets (12.5 mg total) by mouth daily. Take with or immediately following a meal.  . Multiple  Vitamins-Minerals  Take 1 tablet by mouth daily.  Marland Kitchen NITROSTAT 0.4 MG SL AS NEEDED FOR CHEST PAIN.  Marland Kitchen Omega-3 FISH OIL 1000 MG  Take 1,000 mg by mouth daily.   Marland Kitchen ZOFRAN ODT 4 MG  Take 1 tab every 8 hrs as needed for nausea  . Delsym cough medicine   . pantoprazole  40 MG tablet TAKE 1 TABLET BY MOUTH  DAILY  . phenazopyridine  95 MG  Take 95 mg by mouth 3 (three) times daily as needed for pain.  . tamsulosin  0.4 MG CAPS  Take 0.4 mg by mouth at bedtime.  Marland Kitchen atorvastatin 20 MG tablet Take 1 tablet (20 mg total) by mouth daily.   No Known Allergies   Past Medical History:  Diagnosis Date  . Arthritis   . CAD (coronary artery disease)   . Chronic back pain   . Chronically dry eyes   . Diabetes mellitus without complication ([place])   . Elevated PSA    being monitored  . GERD (gastroesophageal reflux disease)   . Hypertension   . Prostate cancer [facility])    Past Surgical History:  Procedure Laterality Date  . APPENDECTOMY    . CARDIAC SURGERY  3 stent placed.  . COLONOSCOPY    . LEFT HEART CATHETERIZATION WITH CORONARY ANGIOGRAM N/A 06/27/2012   Procedure: LEFT HEART CATHETERIZATION WITH CORONARY ANGIOGRAM;  Surgeon: Candee Furbish, MD;  Location[facility] LAB;  Service: Cardiovascular;  Laterality: N/A;  . LEFT HEART CATHETERIZATION WITH CORONARY ANGIOGRAM N/A 07/25/2012   Procedure: LEFT HEART CATHETERIZATION WITH CORONARY ANGIOGRAM;  Surgeon: Sueanne Margarita, MD;  Location: [place] CATH LAB;  Service: Cardiovascular;  Laterality: N/A;  . PROSTATE BIOPSY    . RADIOLOGY WITH ANESTHESIA N/A 12/03/2015   Procedure: MRI LUMBAR SPINE;  Surgeon: Medication Radiologist, MD;  Location: [place];  Service: Radiology;  Laterality: N/A;   Review of Systems    10 point systems review negative except as above.    Objective:   Physical Exam  BP 116/80   Pulse 88   Temp 97.2 F (36.2 C)   Resp 16   Ht 5' 9"  (1.753 m)   Wt 183 lb 6.4 oz (83.2 kg)   BMI 27.08 kg/m   HEENT - WNL. Neck - supple. Nl  Thyroid.  Chest - Clear equal BS. Cor - Nl HS. RRR w/o sig MGR. PP 1(+). No edema. Abd - Soft, benign w/o tenderness. MS- FROM w/o deformities. Muscle power, tone and bulk Nl. Gait Nl. Neuro -Nl w/o focal abnormalities. Psyche - Mental status with flat, depressed affect.     Assessment & Plan:   1. Essential hypertension   2. T2_NIDDM w/CKD2   ([employer])   3. Dysuria  - Urinalysis, Routine w reflex microscopic - Urine culture  4. Fatigue, unspecified type  - CBC with Differential/Platelet - BASIC METABOLIC PANEL WITH GFR - Hepatic function panel - TSH - Urinalysis, Routine w reflex microscopic - Urine culture  5. Medication management  - CBC with Differential/Platelet - BASIC METABOLIC PANEL WITH GFR - Hepatic function panel - TSH

## 2017-01-02 LAB — URINALYSIS, ROUTINE W REFLEX MICROSCOPIC
BILIRUBIN URINE: NEGATIVE
HGB URINE DIPSTICK: NEGATIVE
LEUKOCYTES UA: NEGATIVE
Nitrite: NEGATIVE
PH: 5 (ref 5.0–8.0)
PROTEIN: NEGATIVE
Specific Gravity, Urine: 1.038 — ABNORMAL HIGH (ref 1.001–1.035)

## 2017-01-02 LAB — URINALYSIS, MICROSCOPIC ONLY
BACTERIA UA: NONE SEEN [HPF]
CASTS: NONE SEEN [LPF]
CRYSTALS: NONE SEEN [HPF]
RBC / HPF: NONE SEEN RBC/HPF (ref <=2)
SQUAMOUS EPITHELIAL / LPF: NONE SEEN [HPF] (ref <=5)
WBC, UA: NONE SEEN WBC/HPF (ref <=5)
YEAST: NONE SEEN [HPF]

## 2017-01-02 LAB — TSH: TSH: 0.5 m[IU]/L (ref 0.40–4.50)

## 2017-01-03 LAB — URINE CULTURE: Organism ID, Bacteria: NO GROWTH

## 2017-01-15 ENCOUNTER — Encounter: Payer: Self-pay | Admitting: Internal Medicine

## 2017-01-15 ENCOUNTER — Ambulatory Visit (INDEPENDENT_AMBULATORY_CARE_PROVIDER_SITE_OTHER): Payer: Medicare Other | Admitting: Internal Medicine

## 2017-01-15 VITALS — BP 110/78 | HR 80 | Temp 97.5°F | Resp 16 | Ht 69.5 in | Wt 182.6 lb

## 2017-01-15 DIAGNOSIS — Z0000 Encounter for general adult medical examination without abnormal findings: Secondary | ICD-10-CM | POA: Diagnosis not present

## 2017-01-15 DIAGNOSIS — Z1212 Encounter for screening for malignant neoplasm of rectum: Secondary | ICD-10-CM

## 2017-01-15 DIAGNOSIS — E069 Thyroiditis, unspecified: Secondary | ICD-10-CM

## 2017-01-15 DIAGNOSIS — Z1211 Encounter for screening for malignant neoplasm of colon: Secondary | ICD-10-CM

## 2017-01-15 DIAGNOSIS — E782 Mixed hyperlipidemia: Secondary | ICD-10-CM | POA: Diagnosis not present

## 2017-01-15 DIAGNOSIS — I10 Essential (primary) hypertension: Secondary | ICD-10-CM | POA: Diagnosis not present

## 2017-01-15 DIAGNOSIS — Z136 Encounter for screening for cardiovascular disorders: Secondary | ICD-10-CM

## 2017-01-15 DIAGNOSIS — Z79899 Other long term (current) drug therapy: Secondary | ICD-10-CM

## 2017-01-15 DIAGNOSIS — K219 Gastro-esophageal reflux disease without esophagitis: Secondary | ICD-10-CM

## 2017-01-15 DIAGNOSIS — Z0001 Encounter for general adult medical examination with abnormal findings: Secondary | ICD-10-CM

## 2017-01-15 DIAGNOSIS — E1122 Type 2 diabetes mellitus with diabetic chronic kidney disease: Secondary | ICD-10-CM

## 2017-01-15 DIAGNOSIS — E559 Vitamin D deficiency, unspecified: Secondary | ICD-10-CM

## 2017-01-15 DIAGNOSIS — Z9861 Coronary angioplasty status: Secondary | ICD-10-CM

## 2017-01-15 DIAGNOSIS — N182 Chronic kidney disease, stage 2 (mild): Secondary | ICD-10-CM

## 2017-01-15 DIAGNOSIS — C61 Malignant neoplasm of prostate: Secondary | ICD-10-CM

## 2017-01-15 DIAGNOSIS — I2510 Atherosclerotic heart disease of native coronary artery without angina pectoris: Secondary | ICD-10-CM

## 2017-01-15 LAB — CBC WITH DIFFERENTIAL/PLATELET
Basophils Absolute: 0 cells/uL (ref 0–200)
Basophils Relative: 0 %
EOS ABS: 200 {cells}/uL (ref 15–500)
Eosinophils Relative: 4 %
HEMATOCRIT: 41.3 % (ref 38.5–50.0)
HEMOGLOBIN: 13.3 g/dL (ref 13.2–17.1)
LYMPHS ABS: 500 {cells}/uL — AB (ref 850–3900)
LYMPHS PCT: 10 %
MCH: 31.1 pg (ref 27.0–33.0)
MCHC: 32.2 g/dL (ref 32.0–36.0)
MCV: 96.5 fL (ref 80.0–100.0)
MONO ABS: 450 {cells}/uL (ref 200–950)
MPV: 11.9 fL (ref 7.5–12.5)
Monocytes Relative: 9 %
NEUTROS PCT: 77 %
Neutro Abs: 3850 cells/uL (ref 1500–7800)
Platelets: 186 10*3/uL (ref 140–400)
RBC: 4.28 MIL/uL (ref 4.20–5.80)
RDW: 13.1 % (ref 11.0–15.0)
WBC: 5 10*3/uL (ref 3.8–10.8)

## 2017-01-15 NOTE — Patient Instructions (Signed)

## 2017-01-15 NOTE — Progress Notes (Signed)
[facility]   [name], M.D.      Uvaldo Bristle. Silverio Lay, P.A.-C [facility]                [address] [place] [address] Telephone [phone] Telefax [phone] Annual  Screening/Preventative Visit  & Comprehensive Evaluation & Examination     This very nice 71 y.o. MBM presents for a Screening/Preventative Visit & comprehensive evaluation and management of multiple medical co-morbidities.  Patient has been followed for HTN, HLD, ASCAD/PCA/Stents ,T2_NIDDM and Vitamin D Deficiency.     Patient was dx'd with Gleason 6 Ca Prostate in Feb 2016 and at re-bx in 04/2016 was re-staged at Gleason 8, so in Sept began ext RT thru Feb 8. He also received Lupron 9/22 and 3/28 and report severe Dysphoria, sweats and weakness after the Lupron shots and currently says he plans not to continue the Lupron inj.      HTN predates circa 1991. Patient's BP has been controlled at home.  He had PCA/Stents for ACS in 2013 by Dr Marlou Porch and today's BP is at goal -  110/78. Patient denies any cardiac symptoms as chest pain, palpitations, shortness of breath, dizziness or ankle swelling.     Patient's hyperlipidemia is controlled with diet and medications. Patient denies myalgias or other medication SE's. Last lipids were at goal albeit elevated Trig's: Lab Results  Component Value Date   CHOL 131 06/23/2016   HDL 51 06/23/2016   LDLCALC 21 06/23/2016   LDLDIRECT 45.7 10/30/2013   TRIG 295 (H) 06/23/2016   CHOLHDL 2.6 06/23/2016      Patient has T2_NIDDM (1998) and patient denies reactive hypoglycemic symptoms, visual blurring, diabetic polys or paresthesias. Last A1c was not at goal: Lab Results  Component Value Date   HGBA1C 7.4 (H) 12/03/2016       Finally, patient has history of Vitamin D Deficiency ("15" in 2008) and last vitamin D was at goal: Lab Results  Component Value Date   VD25OH 79 03/10/2016    Current Outpatient Prescriptions on File Prior to Visit  Medication Sig  . aspirin 81 MG tablet Take 81 mg by mouth daily.  Marland Kitchen VITAMIN D 5000 UNITS CAPS Take 2caps   daily.  Marland Kitchen JARDIANCE 25 MG TABS tablet Take 25 mg by mouth daily.  . enalapril  2.5 MG tablet Take 1 tab daily.  Marland Kitchen fFLONASE nasal spray Place 2 sprays into both nostrils daily.  Marland Kitchen gabapentin  600 MG tablet TAKE 1 TAB 3 x A DAY  . glipiZIDE  5 MG tablet Take 1 tab 3 x daily for glucose>200   . ANUSOL-HC 25 MG supp Place  rectally 2 x daily as needed   . Magnesium 250 MG TABS Take 250 mg by mouth daily.  . megestrol  20 MG tablet Take  2 x daily.  Marland Kitchen GLUCOPHAGE-XR 500 MG 24 hr tablet TAKE 1-2 TAB 2 x DAILY   . metoprolol succ-XL 25 MG  Take 0.5 tab  daily  . Multiple Vitamins-Minerals  Take 1 tablet by mouth daily.  Marland Kitchen NITROSTAT  0.4 MG SL   AS NEEDED FOR CHEST PAIN.  Marland Kitchen Omega-3 FISH OIL 1000 MG  Take 1,000 mg by mouth daily.   . Ondansetron ODT 4 MG  Take 1 tab every 8  hrs as needed for N/V  . pantoprazole 40 MG  tablet TAKE 1 TABLET BY MOUTH  DAILY  . phenazopyridine  95 MG  Take 95 mg  3  times daily as needed   . tamsulosin0.4 MG CAPS  Take 0.4 mg by mouth at bedtime.  Marland Kitchen atorvastatin  20 MG tablet Take 1 tab daily.   No Active Allergies   Past Medical History:  Diagnosis Date  . Arthritis   . CAD (coronary artery disease)   . Chronic back pain   . Chronically dry eyes   . Diabetes mellitus without complication ([place])   . Elevated PSA    being monitored  . GERD (gastroesophageal reflux disease)   . Hypertension   . Prostate cancer [facility])    Health Maintenance  Topic Date Due  . FOOT EXAM  09/04/2016  . OPHTHALMOLOGY EXAM  04/02/2017  . INFLUENZA VACCINE  04/14/2017  . PNA vac Low Risk Adult (2 of 2 - PPSV23) 04/23/2017  . HEMOGLOBIN A1C  06/05/2017  . COLONOSCOPY  02/13/2020  . TETANUS/TDAP  11/27/2023  . Hepatitis C Screening  Completed   Immunization History  Administered Date(s) Administered  .  Influenza Split 06/26/2012  . Influenza, High Dose Seasonal PF 06/20/2015, 06/23/2016  . Influenza-Unspecified 06/14/2013, 06/25/2014  . Pneumococcal Conjugate-13 04/23/2016  . Pneumococcal-Unspecified 06/16/2010  . Td 09/17/2003  . Tdap 11/26/2013  . Zoster 07/12/2012   Past Surgical History:  Procedure Laterality Date  . APPENDECTOMY    . CARDIAC SURGERY     3 stent placed.  . COLONOSCOPY    . LEFT HEART CATHETERIZATION WITH CORONARY ANGIOGRAM N/A 06/27/2012   Procedure: LEFT HEART CATHETERIZATION WITH CORONARY ANGIOGRAM;  Surgeon: Candee Furbish, MD;  Location[facility] C[facility] LAB;  Service: Cardiovascular;  Laterality: N/A;  . LEFT HEART CATHETERIZATION WITH CORONARY ANGIOGRAM N/A 07/25/2012   Procedure: LEFT HEART CATHETERIZATION WITH CORONARY ANGIOGRAM;  Surgeon: Sueanne Margarita, MD;  Location: [place] CATH LAB;  Service: Cardiovascular;  Laterality: N/A;  . PROSTATE BIOPSY    . RADIOLOGY WITH ANESTHESIA N/A 12/03/2015   Procedure: MRI LUMBAR SPINE;  Surgeon: Medication Radiologist, MD;  Location: [place];  Service: Radiology;  Laterality: N/A;   Family History  Problem Relation Age of Onset  . Diabetes Sister    Social History   Social History  . Marital status: Married    Spouse name: N/A  . Number of children: 4  . Years of education: 2 y colleg   Occupational History  . 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 Smoking status: Former Smoker    Years: 50.00    Quit date: 07/27/2011  . Smokeless tobacco: Never Used  . Alcohol use Yes     Comment: occasional  . Drug use: No  . Sexual activity: Not on file    ROS Constitutional: Denies fever, chills, weight loss/gain, headaches, insomnia,  night sweats or change in appetite. Does c/o fatigue. Eyes: Denies redness, blurred vision, diplopia, discharge, itchy or watery eyes.  ENT: Denies discharge, congestion, post nasal drip, epistaxis, sore throat, earache, hearing loss, dental pain, Tinnitus, Vertigo, Sinus  pain or snoring.  Cardio: Denies chest pain, palpitations, irregular heartbeat, syncope, dyspnea, diaphoresis, orthopnea, PND, claudication or edema Respiratory: denies cough, dyspnea, DOE, pleurisy, hoarseness, laryngitis or wheezing.  Gastrointestinal: Denies dysphagia, heartburn, reflux, water brash, pain, cramps, nausea, vomiting, bloating, diarrhea, constipation, hematemesis, melena, hematochezia, jaundice or hemorrhoids Genitourinary: Denies dysuria, frequency, urgency, nocturia, hesitancy, discharge, hematuria or flank pain Musculoskeletal: Denies arthralgia, myalgia, stiffness, Jt. Swelling, pain, limp  or strain/sprain. Denies Falls. Skin: Denies puritis, rash, hives, warts, acne, eczema or change in skin lesion Neuro: No weakness, tremor, incoordination, spasms, paresthesia or pain Psychiatric: Denies confusion, memory loss or sensory loss. Denies Depression. Endocrine: Denies change in weight, skin, hair change, nocturia, and paresthesia, diabetic polys, visual blurring or hyper / hypo glycemic episodes.  Heme/Lymph: No excessive bleeding, bruising or enlarged lymph nodes.  Physical Exam  BP 110/78   Pulse 80   Temp 97.5 F (36.4 C)   Resp 16   Ht 5' 9.5" (1.765 m)   Wt 182 lb 9.6 oz (82.8 kg)   BMI 26.58 kg/m   General Appearance: Well nourished and well groomed and in no apparent distress.  Eyes: PERRLA, EOMs, conjunctiva no swelling or erythema, normal fundi and vessels. Sinuses: No frontal/maxillary tenderness ENT/Mouth: EACs patent / TMs  nl. Nares clear without erythema, swelling, mucoid exudates. Oral hygiene is good. No erythema, swelling, or exudate. Tongue normal, non-obstructing. Tonsils not swollen or erythematous. Hearing normal.  Neck: Supple, thyroid normal. No bruits, nodes or JVD. Respiratory: Respiratory effort normal.  BS equal and clear bilateral without rales, rhonci, wheezing or stridor. Cardio: Heart sounds are normal with regular rate and rhythm and no  murmurs, rubs or gallops. Peripheral pulses are normal and equal bilaterally without edema. No aortic or femoral bruits. Chest: symmetric with normal excursions and percussion.  Abdomen: Soft, with Nl bowel sounds. Nontender, no guarding, rebound, hernias, masses, or organomegaly.  Lymphatics: Non tender without lymphadenopathy.  Genitourinary: DRE - deferred to recent prostate cancer treatments. Musculoskeletal: Full ROM all peripheral extremities, joint stability, 5/5 strength, and normal gait. Skin: Warm and dry without rashes, lesions, cyanosis, clubbing or  ecchymosis.  Neuro: Cranial nerves intact, reflexes equal bilaterally. Normal muscle tone, no cerebellar symptoms. Sensation intact to touch, vibratory and Monofilament to the toes bilaterally.  Pysch: Alert and oriented X 3 with normal affect, insight and judgment appropriate.   Assessment and Plan  1. Annual Preventative/Screening Exam    2. Essential hypertension   - EKG 12-Lead - [place], RETROPERITNL ABD,  LTD - Urinalysis, Routine w reflex microscopic - Microalbumin / creatinine urine ratio - CBC with Differential/Platelet - BASIC METABOLIC PANEL WITH GFR - Magnesium - TSH  3. Hyperlipidemia, mixed   - EKG 12-Lead - [place], RETROPERITNL ABD,  LTD - Hepatic function panel - Lipid panel - TSH  4. T2_NIDDM w/ CKD 2   (HCC)  - EKG 12-Lead - [place], RETROPERITNL ABD,  LTD - HM DIABETES FOOT EXAM - LOW EXTREMITY NEUR EXAM DOCUM - Hemoglobin A1c - Insulin, random  5. Vitamin D deficiency  - VITAMIN D 25 Hydroxy   6. Thyroiditis  - TSH  7. Malignant neoplasm of prostate ([place])   8. Gastroesophageal reflux disease   9. ASCAD s/p PTCA (06/2012)  - EKG 12-Lead - Lipid panel  10. Screening for colorectal cancer  - POC Hemoccult Bld/Stl   11. Screening for ischemic heart disease  - EKG 12-Lead  12. Screening for AAA (aortic abdominal aneurysm)  - [place], RETROPERITNL ABD,  LTD  13. Medication  management  - Urinalysis, Routine w reflex microscopic - Microalbumin / creatinine urine ratio - CBC with Differential/Platelet - BASIC METABOLIC PANEL WITH GFR - Hepatic function panel - Magnesium - Lipid panel - TSH - Hemoglobin A1c - Insulin, random - VITAMIN D 25 Hydroxy        Patient was counseled in prudent diet, weight control to achieve/maintain BMI less than 25, BP  monitoring, regular exercise and medications as discussed.  Discussed med effects and SE's. Routine screening labs and tests as requested with regular follow-up as recommended. Over 40 minutes of exam, counseling, chart review and high complex critical decision making was performed

## 2017-01-16 LAB — URINALYSIS, ROUTINE W REFLEX MICROSCOPIC
BILIRUBIN URINE: NEGATIVE
Hgb urine dipstick: NEGATIVE
LEUKOCYTES UA: NEGATIVE
Nitrite: NEGATIVE
PROTEIN: NEGATIVE
Specific Gravity, Urine: 1.038 — ABNORMAL HIGH (ref 1.001–1.035)
pH: 5 (ref 5.0–8.0)

## 2017-01-16 LAB — BASIC METABOLIC PANEL WITH GFR
BUN: 18 mg/dL (ref 7–25)
CALCIUM: 9.9 mg/dL (ref 8.6–10.3)
CO2: 15 mmol/L — AB (ref 20–31)
CREATININE: 1.07 mg/dL (ref 0.70–1.25)
Chloride: 103 mmol/L (ref 98–110)
GFR, EST NON AFRICAN AMERICAN: 70 mL/min (ref >=60)
GFR, Est African American: 81 mL/min (ref >=60)
GLUCOSE: 312 mg/dL — AB (ref 65–99)
Potassium: 4.8 mmol/L (ref 3.5–5.3)
Sodium: 135 mmol/L (ref 135–146)

## 2017-01-16 LAB — HEPATIC FUNCTION PANEL
ALBUMIN: 4.5 g/dL (ref 3.6–5.1)
ALT: 16 U/L (ref 9–46)
AST: 11 U/L (ref 10–35)
Alkaline Phosphatase: 73 U/L (ref 40–115)
BILIRUBIN INDIRECT: 0.3 mg/dL (ref 0.2–1.2)
Bilirubin, Direct: 0.1 mg/dL (ref <=0.2)
TOTAL PROTEIN: 6.9 g/dL (ref 6.1–8.1)
Total Bilirubin: 0.4 mg/dL (ref 0.2–1.2)

## 2017-01-16 LAB — HEMOGLOBIN A1C
HEMOGLOBIN A1C: 10.5 % — AB (ref <5.7)
Mean Plasma Glucose: 255 mg/dL

## 2017-01-16 LAB — VITAMIN D 25 HYDROXY (VIT D DEFICIENCY, FRACTURES): Vit D, 25-Hydroxy: 68 ng/mL (ref 30–100)

## 2017-01-16 LAB — MICROALBUMIN / CREATININE URINE RATIO
CREATININE, URINE: 37 mg/dL (ref 20–370)
Microalb Creat Ratio: 24 mcg/mg creat (ref <30)
Microalb, Ur: 0.9 mg/dL

## 2017-01-16 LAB — URINALYSIS, MICROSCOPIC ONLY
BACTERIA UA: NONE SEEN [HPF]
Casts: NONE SEEN [LPF]
Crystals: NONE SEEN [HPF]
RBC / HPF: NONE SEEN RBC/HPF (ref <=2)
SQUAMOUS EPITHELIAL / LPF: NONE SEEN [HPF] (ref <=5)
WBC UA: NONE SEEN WBC/HPF (ref <=5)
YEAST: NONE SEEN [HPF]

## 2017-01-16 LAB — LIPID PANEL
CHOLESTEROL: 175 mg/dL (ref <200)
HDL: 48 mg/dL (ref >40)
LDL Cholesterol: 68 mg/dL (ref <100)
Total CHOL/HDL Ratio: 3.6 Ratio (ref <5.0)
Triglycerides: 297 mg/dL — ABNORMAL HIGH (ref <150)
VLDL: 59 mg/dL — AB (ref <30)

## 2017-01-16 LAB — MAGNESIUM: Magnesium: 2.1 mg/dL (ref 1.5–2.5)

## 2017-01-16 LAB — TSH: TSH: 0.49 mIU/L (ref 0.40–4.50)

## 2017-01-18 LAB — INSULIN, RANDOM: INSULIN: 7.2 u[IU]/mL (ref 2.0–19.6)

## 2017-01-19 ENCOUNTER — Other Ambulatory Visit: Payer: Self-pay | Admitting: Internal Medicine

## 2017-01-27 ENCOUNTER — Ambulatory Visit (INDEPENDENT_AMBULATORY_CARE_PROVIDER_SITE_OTHER): Payer: Medicare Other | Admitting: Internal Medicine

## 2017-01-27 ENCOUNTER — Encounter: Payer: Self-pay | Admitting: Internal Medicine

## 2017-01-27 VITALS — BP 108/82 | HR 88 | Temp 97.4°F | Resp 16

## 2017-01-27 DIAGNOSIS — R531 Weakness: Secondary | ICD-10-CM

## 2017-01-27 DIAGNOSIS — Z79899 Other long term (current) drug therapy: Secondary | ICD-10-CM | POA: Diagnosis not present

## 2017-01-27 DIAGNOSIS — E1165 Type 2 diabetes mellitus with hyperglycemia: Secondary | ICD-10-CM

## 2017-01-27 DIAGNOSIS — J0100 Acute maxillary sinusitis, unspecified: Secondary | ICD-10-CM

## 2017-01-27 LAB — CBC WITH DIFFERENTIAL/PLATELET
BASOS PCT: 1 %
Basophils Absolute: 52 cells/uL (ref 0–200)
EOS PCT: 3 %
Eosinophils Absolute: 156 cells/uL (ref 15–500)
HEMATOCRIT: 45 % (ref 38.5–50.0)
HEMOGLOBIN: 14.7 g/dL (ref 13.2–17.1)
LYMPHS ABS: 988 {cells}/uL (ref 850–3900)
LYMPHS PCT: 19 %
MCH: 30.8 pg (ref 27.0–33.0)
MCHC: 32.7 g/dL (ref 32.0–36.0)
MCV: 94.1 fL (ref 80.0–100.0)
MONO ABS: 468 {cells}/uL (ref 200–950)
MPV: 11.6 fL (ref 7.5–12.5)
Monocytes Relative: 9 %
Neutro Abs: 3536 cells/uL (ref 1500–7800)
Neutrophils Relative %: 68 %
Platelets: 204 10*3/uL (ref 140–400)
RBC: 4.78 MIL/uL (ref 4.20–5.80)
RDW: 12.8 % (ref 11.0–15.0)
WBC: 5.2 10*3/uL (ref 3.8–10.8)

## 2017-01-27 MED ORDER — PREDNISONE 20 MG PO TABS: ORAL_TABLET | ORAL | 0 refills | Status: DC

## 2017-01-27 MED ORDER — LEVOFLOXACIN 500 MG PO TABS: 500.0000 mg | ORAL_TABLET | Freq: Every day | ORAL | 0 refills | Status: DC

## 2017-01-27 NOTE — Progress Notes (Signed)
Subjective:    Patient ID: Michael Guzman, male    DOB: Feb 03, 1946, 71 y.o.   MRN: [id number]  HPI  Patient is a nice 71 yo MBM with recent OV and labs showing A1c 10.5% on 5.5.2018.  Ur sp gr was 1.038, CBC was Nl, BUN & Creat were Nl at 18 & 1.07 and Glu was 312 mg%. He presents today with very non-specific c/o of "Don't feel good" and with head/chest congestion and dry cough. He's c/o weakness and poor balance. Denies fever, chills, rash or dyspnea. He alleges his CBG's are in the 100-200 range.   Medication Sig  . Artificial Tear Ointment (DRY EYES OP) Place 1 drop into both eyes daily as needed (for dry eyes).  Marland Kitchen aspirin 81 MG tablet Take 81 mg by mouth daily.  . Cholecalciferol (VITAMIN D3) 5000 UNITS CAPS Take 5,000 Units by mouth 2 (two) times daily.  . empagliflozin (JARDIANCE) 25 MG TABS tablet Take 25 mg by mouth daily.  . enalapril (VASOTEC) 2.5 MG tablet Take 1 tablet (2.5 mg total) by mouth daily.  . fluticasone (FLONASE) 50 MCG/ACT nasal spray Place 2 sprays into both nostrils daily.  Marland Kitchen gabapentin (NEURONTIN) 600 MG tablet TAKE 1 TABLET 3 TIMES A DAY AS NEEDED FOR PAIN  . glipiZIDE (GLUCOTROL) 5 MG tablet TAKE 1 TABLET BY MOUTH 3  TIMES DAILY FOR GLUCOSE  OVER 200 OR AS DIRECTED BY  YOUR DOCTOR.  . hydrocortisone (ANUSOL-HC) 25 MG suppository Place 25 mg rectally 2 (two) times daily as needed for hemorrhoids or itching.  . Magnesium 250 MG TABS Take 250 mg by mouth daily.  . megestrol (MEGACE) 20 MG tablet Take 20 mg by mouth 2 (two) times daily.  . metFORMIN (GLUCOPHAGE-XR) 500 MG 24 hr tablet TAKE 1 TO 2 TABLETS BY  MOUTH TWO TIMES DAILY AS  DIRECTED FOR DIABETES  . metoprolol succinate (TOPROL-XL) 25 MG 24 hr tablet Take 0.5 tablets (12.5 mg total) by mouth daily. Take with or immediately following a meal.  . montelukast (SINGULAIR) 10 MG tablet   . Multiple Vitamins-Minerals (MULTIVITAMIN ADULT PO) Take 1 tablet by mouth daily.  . nitroGLYCERIN (NITROSTAT) 0.4 MG SL  tablet PLACE ONE TABLET UNDER THE TONGUE EVERY 5 MINUTES AS NEEDED FOR CHEST PAIN.  Marland Kitchen Omega-3 Fatty Acids (FISH OIL) 1000 MG CAPS Take 1,000 mg by mouth daily.   . ondansetron (ZOFRAN ODT) 4 MG disintegrating tablet Take 1 tablet (4 mg total) by mouth every 8 (eight) hours as needed for nausea or vomiting.  Marland Kitchen OVER THE COUNTER MEDICATION Delsum cough medicine  . pantoprazole (PROTONIX) 40 MG tablet TAKE 1 TABLET BY MOUTH  DAILY  . phenazopyridine (PYRIDIUM) 95 MG tablet Take 95 mg by mouth 3 (three) times daily as needed for pain.  . tamsulosin (FLOMAX) 0.4 MG CAPS capsule Take 0.4 mg by mouth at bedtime.  Marland Kitchen atorvastatin (LIPITOR) 20 MG tablet Take 1 tablet (20 mg total) by mouth daily.   No Known Allergies  Past Medical History:  Diagnosis Date  . Arthritis   . CAD (coronary artery disease)   . Chronic back pain   . Chronically dry eyes   . Diabetes mellitus without complication ([place])   . Elevated PSA    being monitored  . GERD (gastroesophageal reflux disease)   . Hypertension   . Prostate cancer [facility])    Past Surgical History:  Procedure Laterality Date  . APPENDECTOMY    . CARDIAC SURGERY  3 stent placed.  . COLONOSCOPY    . LEFT HEART CATHETERIZATION WITH CORONARY ANGIOGRAM N/A 06/27/2012   Procedure: LEFT HEART CATHETERIZATION WITH CORONARY ANGIOGRAM;  Surgeon: Candee Furbish, MD;  Location: [facility] CATH LAB;  Service: Cardiovascular;  Laterality: N/A;  . LEFT HEART CATHETERIZATION WITH CORONARY ANGIOGRAM N/A 07/25/2012   Procedure: LEFT HEART CATHETERIZATION WITH CORONARY ANGIOGRAM;  Surgeon: Sueanne Margarita, MD;  Location: [place] CATH LAB;  Service: Cardiovascular;  Laterality: N/A;  . PROSTATE BIOPSY    . RADIOLOGY WITH ANESTHESIA N/A 12/03/2015   Procedure: MRI LUMBAR SPINE;  Surgeon: Medication Radiologist, MD;  Location: [place];  Service: Radiology;  Laterality: N/A;   Review of Systems  10 point systems review negative except as above.    Objective:   Physical Exam  BP  108/82   Pulse 88   Temp 97.4 F (36.3 C)   Resp 16    In no distress. Dry cough O2 sat = 99%.   Skin - clear. HEENT - Eac's patent. TM's Nl. EOM's full. PERRLA. Sl maxillary tenderness bilaterally. NasoOroPharynx clear. Neck - supple. Nl Thyroid. Carotids 2+ & No bruits, nodes, JVD Chest - Clear equal BS w/ few scattered rales and no rhonchi, wheezes. Cor - Nl HS. RRR w/o sig MGR. PP 1(+). No edema. Abd - Soft MS- FROM w/o deformities. Muscle power, tone and bulk Nl. Gait Nl. Neuro - No obvious Cr N abnormalities.  Nl w/o focal abnormalities. Psyche - Alert with flat affect.     Assessment & Plan:   1. Acute non-recurrent maxillary sinusitis  - CBC with Differential/Platelet  2. Weakness  - CBC with Differential/Platelet - BASIC METABOLIC PANEL WITH GFR - CK  3. Medication management  - CBC with Differential/Platelet - BASIC METABOLIC PANEL WITH GFR - CK  4. Poorly controlled type 2 diabetes mellitus (HCC)  - BASIC METABOLIC PANEL WITH GFR  5. Subacute maxillary sinusitis  - levofloxacin (LEVAQUIN) 500 MG tablet; Take 1 tablet (500 mg total) by mouth daily.  Dispense: 10 tablet; Refill: 0 - predniSONE (DELTASONE) 20 MG tablet; 1 tab 3 x day for 2 days, then 1 tab 2 x day for 2 days, then 1 tab 1 x day for 3 days  Dispense: 13 tablet; Refill: 0

## 2017-01-28 LAB — BASIC METABOLIC PANEL WITH GFR
BUN: 26 mg/dL — ABNORMAL HIGH (ref 7–25)
CALCIUM: 10.4 mg/dL — AB (ref 8.6–10.3)
CO2: 14 mmol/L — ABNORMAL LOW (ref 20–31)
Chloride: 101 mmol/L (ref 98–110)
Creat: 1.24 mg/dL (ref 0.70–1.25)
GFR, EST NON AFRICAN AMERICAN: 59 mL/min — AB (ref >=60)
GFR, Est African American: 68 mL/min (ref >=60)
Glucose, Bld: 247 mg/dL — ABNORMAL HIGH (ref 65–99)
POTASSIUM: 4.7 mmol/L (ref 3.5–5.3)
SODIUM: 132 mmol/L — AB (ref 135–146)

## 2017-01-28 LAB — CK: CK TOTAL: 51 U/L (ref 44–196)

## 2017-01-30 ENCOUNTER — Encounter (HOSPITAL_COMMUNITY): Payer: Self-pay | Admitting: Emergency Medicine

## 2017-01-30 ENCOUNTER — Emergency Department (HOSPITAL_COMMUNITY): Payer: Medicare Other

## 2017-01-30 ENCOUNTER — Inpatient Hospital Stay (HOSPITAL_COMMUNITY)
Admission: EM | Admit: 2017-01-30 | Discharge: 2017-02-03 | DRG: 638 | Disposition: A | Discharge status: Home Health | Payer: Medicare Other | Attending: Family Medicine | Admitting: Family Medicine

## 2017-01-30 DIAGNOSIS — N182 Chronic kidney disease, stage 2 (mild): Secondary | ICD-10-CM | POA: Diagnosis not present

## 2017-01-30 DIAGNOSIS — E871 Hypo-osmolality and hyponatremia: Secondary | ICD-10-CM | POA: Diagnosis present

## 2017-01-30 DIAGNOSIS — E1110 Type 2 diabetes mellitus with ketoacidosis without coma: Secondary | ICD-10-CM | POA: Diagnosis not present

## 2017-01-30 DIAGNOSIS — I10 Essential (primary) hypertension: Secondary | ICD-10-CM | POA: Diagnosis not present

## 2017-01-30 DIAGNOSIS — Z87891 Personal history of nicotine dependence: Secondary | ICD-10-CM

## 2017-01-30 DIAGNOSIS — E785 Hyperlipidemia, unspecified: Secondary | ICD-10-CM

## 2017-01-30 DIAGNOSIS — E1122 Type 2 diabetes mellitus with diabetic chronic kidney disease: Secondary | ICD-10-CM | POA: Diagnosis present

## 2017-01-30 DIAGNOSIS — E875 Hyperkalemia: Secondary | ICD-10-CM | POA: Diagnosis present

## 2017-01-30 DIAGNOSIS — J019 Acute sinusitis, unspecified: Secondary | ICD-10-CM | POA: Diagnosis not present

## 2017-01-30 DIAGNOSIS — E1165 Type 2 diabetes mellitus with hyperglycemia: Secondary | ICD-10-CM

## 2017-01-30 DIAGNOSIS — Z955 Presence of coronary angioplasty implant and graft: Secondary | ICD-10-CM

## 2017-01-30 DIAGNOSIS — C61 Malignant neoplasm of prostate: Secondary | ICD-10-CM

## 2017-01-30 DIAGNOSIS — Z923 Personal history of irradiation: Secondary | ICD-10-CM

## 2017-01-30 DIAGNOSIS — R079 Chest pain, unspecified: Secondary | ICD-10-CM | POA: Diagnosis present

## 2017-01-30 DIAGNOSIS — I2510 Atherosclerotic heart disease of native coronary artery without angina pectoris: Secondary | ICD-10-CM | POA: Diagnosis present

## 2017-01-30 DIAGNOSIS — E860 Dehydration: Secondary | ICD-10-CM | POA: Diagnosis present

## 2017-01-30 DIAGNOSIS — Z9119 Patient's noncompliance with other medical treatment and regimen: Secondary | ICD-10-CM

## 2017-01-30 DIAGNOSIS — Z794 Long term (current) use of insulin: Secondary | ICD-10-CM | POA: Diagnosis not present

## 2017-01-30 DIAGNOSIS — E782 Mixed hyperlipidemia: Secondary | ICD-10-CM | POA: Diagnosis not present

## 2017-01-30 DIAGNOSIS — R531 Weakness: Secondary | ICD-10-CM | POA: Diagnosis not present

## 2017-01-30 DIAGNOSIS — Z7984 Long term (current) use of oral hypoglycemic drugs: Secondary | ICD-10-CM | POA: Diagnosis not present

## 2017-01-30 DIAGNOSIS — E1169 Type 2 diabetes mellitus with other specified complication: Secondary | ICD-10-CM | POA: Diagnosis present

## 2017-01-30 DIAGNOSIS — E1310 Other specified diabetes mellitus with ketoacidosis without coma: Secondary | ICD-10-CM

## 2017-01-30 DIAGNOSIS — R05 Cough: Secondary | ICD-10-CM | POA: Diagnosis not present

## 2017-01-30 DIAGNOSIS — K219 Gastro-esophageal reflux disease without esophagitis: Secondary | ICD-10-CM | POA: Diagnosis not present

## 2017-01-30 DIAGNOSIS — N179 Acute kidney failure, unspecified: Secondary | ICD-10-CM | POA: Diagnosis not present

## 2017-01-30 DIAGNOSIS — Z79899 Other long term (current) drug therapy: Secondary | ICD-10-CM

## 2017-01-30 DIAGNOSIS — Z9861 Coronary angioplasty status: Secondary | ICD-10-CM

## 2017-01-30 DIAGNOSIS — Z8546 Personal history of malignant neoplasm of prostate: Secondary | ICD-10-CM

## 2017-01-30 DIAGNOSIS — R350 Frequency of micturition: Secondary | ICD-10-CM | POA: Diagnosis not present

## 2017-01-30 DIAGNOSIS — R740 Nonspecific elevation of levels of transaminase and lactic acid dehydrogenase [LDH]: Secondary | ICD-10-CM | POA: Diagnosis present

## 2017-01-30 DIAGNOSIS — R0602 Shortness of breath: Secondary | ICD-10-CM | POA: Diagnosis not present

## 2017-01-30 DIAGNOSIS — E1142 Type 2 diabetes mellitus with diabetic polyneuropathy: Secondary | ICD-10-CM

## 2017-01-30 DIAGNOSIS — M069 Rheumatoid arthritis, unspecified: Secondary | ICD-10-CM | POA: Diagnosis not present

## 2017-01-30 DIAGNOSIS — R631 Polydipsia: Secondary | ICD-10-CM | POA: Diagnosis present

## 2017-01-30 DIAGNOSIS — E069 Thyroiditis, unspecified: Secondary | ICD-10-CM | POA: Diagnosis present

## 2017-01-30 DIAGNOSIS — Z833 Family history of diabetes mellitus: Secondary | ICD-10-CM | POA: Diagnosis not present

## 2017-01-30 DIAGNOSIS — Z7982 Long term (current) use of aspirin: Secondary | ICD-10-CM

## 2017-01-30 DIAGNOSIS — N400 Enlarged prostate without lower urinary tract symptoms: Secondary | ICD-10-CM | POA: Diagnosis present

## 2017-01-30 DIAGNOSIS — E1010 Type 1 diabetes mellitus with ketoacidosis without coma: Secondary | ICD-10-CM | POA: Diagnosis not present

## 2017-01-30 DIAGNOSIS — T380X5A Adverse effect of glucocorticoids and synthetic analogues, initial encounter: Secondary | ICD-10-CM | POA: Diagnosis not present

## 2017-01-30 DIAGNOSIS — E872 Acidosis: Secondary | ICD-10-CM | POA: Diagnosis not present

## 2017-01-30 HISTORY — DX: Type 2 diabetes mellitus with ketoacidosis without coma: E11.10

## 2017-01-30 LAB — BLOOD GAS, VENOUS

## 2017-01-30 LAB — COMPREHENSIVE METABOLIC PANEL
ALBUMIN: 4.2 g/dL (ref 3.5–5.0)
ALK PHOS: 72 U/L (ref 38–126)
ALT: 16 U/L — AB (ref 17–63)
ANION GAP: 21 — AB (ref 5–15)
AST: 15 U/L (ref 15–41)
BUN: 39 mg/dL — ABNORMAL HIGH (ref 6–20)
CALCIUM: 10.2 mg/dL (ref 8.9–10.3)
CO2: 7 mmol/L — AB (ref 22–32)
CREATININE: 1.64 mg/dL — AB (ref 0.61–1.24)
Chloride: 97 mmol/L — ABNORMAL LOW (ref 101–111)
GFR calc Af Amer: 48 mL/min — ABNORMAL LOW (ref >60)
GFR calc non Af Amer: 41 mL/min — ABNORMAL LOW (ref >60)
GLUCOSE: 444 mg/dL — AB (ref 65–99)
Potassium: 5.3 mmol/L — ABNORMAL HIGH (ref 3.5–5.1)
SODIUM: 125 mmol/L — AB (ref 135–145)
TOTAL PROTEIN: 7.7 g/dL (ref 6.5–8.1)
Total Bilirubin: 1.7 mg/dL — ABNORMAL HIGH (ref 0.3–1.2)

## 2017-01-30 LAB — I-STAT CG4 LACTIC ACID, ED
Lactic Acid, Venous: 1.49 mmol/L (ref 0.5–1.9)
Lactic Acid, Venous: 2.39 mmol/L (ref 0.5–1.9)

## 2017-01-30 LAB — I-STAT VENOUS BLOOD GAS, ED
Acid-base deficit: 16 mmol/L — ABNORMAL HIGH (ref 0.0–2.0)
Bicarbonate: 9.1 mmol/L — ABNORMAL LOW (ref 20.0–28.0)
O2 SAT: 58 %
PCO2 VEN: 20.9 mmHg — AB (ref 44.0–60.0)
PO2 VEN: 34 mmHg (ref 32.0–45.0)
TCO2: 10 mmol/L (ref 0–100)
pH, Ven: 7.25 (ref 7.250–7.430)

## 2017-01-30 LAB — CBC WITH DIFFERENTIAL/PLATELET
BASOS PCT: 0 %
Basophils Absolute: 0 10*3/uL (ref 0.0–0.1)
EOS ABS: 0 10*3/uL (ref 0.0–0.7)
EOS PCT: 0 %
HEMATOCRIT: 43.2 % (ref 39.0–52.0)
Hemoglobin: 14.7 g/dL (ref 13.0–17.0)
Lymphocytes Relative: 11 %
Lymphs Abs: 1.1 10*3/uL (ref 0.7–4.0)
MCH: 30.8 pg (ref 26.0–34.0)
MCHC: 34 g/dL (ref 30.0–36.0)
MCV: 90.6 fL (ref 78.0–100.0)
MONO ABS: 0 10*3/uL — AB (ref 0.1–1.0)
MONOS PCT: 0 %
NEUTROS ABS: 9.1 10*3/uL — AB (ref 1.7–7.7)
Neutrophils Relative %: 89 %
PLATELETS: 220 10*3/uL (ref 150–400)
RBC: 4.77 MIL/uL (ref 4.22–5.81)
RDW: 11.9 % (ref 11.5–15.5)
WBC: 10.3 10*3/uL (ref 4.0–10.5)

## 2017-01-30 LAB — BASIC METABOLIC PANEL
ANION GAP: 22 — AB (ref 5–15)
BUN: 36 mg/dL — ABNORMAL HIGH (ref 6–20)
CHLORIDE: 99 mmol/L — AB (ref 101–111)
CO2: 7 mmol/L — ABNORMAL LOW (ref 22–32)
Calcium: 9.6 mg/dL (ref 8.9–10.3)
Creatinine, Ser: 1.44 mg/dL — ABNORMAL HIGH (ref 0.61–1.24)
GFR calc Af Amer: 56 mL/min — ABNORMAL LOW (ref >60)
GFR, EST NON AFRICAN AMERICAN: 48 mL/min — AB (ref >60)
Glucose, Bld: 318 mg/dL — ABNORMAL HIGH (ref 65–99)
POTASSIUM: 5.6 mmol/L — AB (ref 3.5–5.1)
Sodium: 128 mmol/L — ABNORMAL LOW (ref 135–145)

## 2017-01-30 LAB — URINALYSIS, ROUTINE W REFLEX MICROSCOPIC
BACTERIA UA: NONE SEEN
Bilirubin Urine: NEGATIVE
HGB URINE DIPSTICK: NEGATIVE
Ketones, ur: 80 mg/dL — AB
LEUKOCYTES UA: NEGATIVE
NITRITE: NEGATIVE
PH: 5 (ref 5.0–8.0)
PROTEIN: 30 mg/dL — AB
SPECIFIC GRAVITY, URINE: 1.026 (ref 1.005–1.030)
Squamous Epithelial / LPF: NONE SEEN

## 2017-01-30 LAB — PHOSPHORUS: PHOSPHORUS: 4.6 mg/dL (ref 2.5–4.6)

## 2017-01-30 LAB — CBG MONITORING, ED
Glucose-Capillary: 385 mg/dL — ABNORMAL HIGH (ref 65–99)
Glucose-Capillary: 418 mg/dL — ABNORMAL HIGH (ref 65–99)
Glucose-Capillary: 250 mg/dL — ABNORMAL HIGH (ref 65–99)

## 2017-01-30 LAB — PROTEIN / CREATININE RATIO, URINE
CREATININE, URINE: 26.1 mg/dL
Protein Creatinine Ratio: 1.26 mg/mg{Cre} — ABNORMAL HIGH (ref 0.00–0.15)
Total Protein, Urine: 33 mg/dL

## 2017-01-30 LAB — GLUCOSE, CAPILLARY: GLUCOSE-CAPILLARY: 211 mg/dL — AB (ref 65–99)

## 2017-01-30 LAB — MAGNESIUM: MAGNESIUM: 2.9 mg/dL — AB (ref 1.7–2.4)

## 2017-01-30 LAB — NA AND K (SODIUM & POTASSIUM), RAND UR
Potassium Urine: 19 mmol/L
SODIUM UR: 51 mmol/L

## 2017-01-30 LAB — LACTIC ACID, PLASMA: Lactic Acid, Venous: 2.6 mmol/L (ref 0.5–1.9)

## 2017-01-30 LAB — BETA-HYDROXYBUTYRIC ACID: Beta-Hydroxybutyric Acid: 7.25 mmol/L — ABNORMAL HIGH (ref 0.05–0.27)

## 2017-01-30 MED ORDER — ZOLPIDEM TARTRATE 5 MG PO TABS: 5.0000 mg | ORAL_TABLET | Freq: Every evening | ORAL | Status: DC | PRN

## 2017-01-30 MED ORDER — OMEGA-3-ACID ETHYL ESTERS 1 G PO CAPS
1.0000 g | ORAL_CAPSULE | Freq: Every day | ORAL | Status: DC
  Administered 2017-01-31 – 2017-02-03 (×4): 1 g via ORAL
  Filled 2017-01-30 (×4): qty 1

## 2017-01-30 MED ORDER — SODIUM CHLORIDE 0.9 % IV BOLUS (SEPSIS)
1000.0000 mL | Freq: Once | INTRAVENOUS | Status: AC
  Administered 2017-01-30: 1000 mL via INTRAVENOUS

## 2017-01-30 MED ORDER — SODIUM CHLORIDE 0.9 % IV BOLUS (SEPSIS): 1500.0000 mL | Freq: Once | INTRAVENOUS | Status: DC

## 2017-01-30 MED ORDER — PHENAZOPYRIDINE HCL 100 MG PO TABS: 95.0000 mg | ORAL_TABLET | Freq: Three times a day (TID) | ORAL | Status: DC

## 2017-01-30 MED ORDER — SODIUM CHLORIDE 0.9 % IV BOLUS (SEPSIS): 2000.0000 mL | Freq: Once | INTRAVENOUS | Status: DC

## 2017-01-30 MED ORDER — VITAMIN D 1000 UNITS PO TABS
5000.0000 [IU] | ORAL_TABLET | Freq: Two times a day (BID) | ORAL | Status: DC
Start: 2017-01-30 — End: 2017-02-03
  Administered 2017-01-30 – 2017-02-03 (×8): 5000 [IU] via ORAL
  Filled 2017-01-30 (×12): qty 5

## 2017-01-30 MED ORDER — GABAPENTIN 600 MG PO TABS
600.0000 mg | ORAL_TABLET | Freq: Three times a day (TID) | ORAL | Status: DC
  Administered 2017-01-30 – 2017-02-03 (×11): 600 mg via ORAL
  Filled 2017-01-30 (×12): qty 1

## 2017-01-30 MED ORDER — ONDANSETRON HCL 4 MG/2ML IJ SOLN: 4.0000 mg | Freq: Three times a day (TID) | INTRAMUSCULAR | Status: DC | PRN

## 2017-01-30 MED ORDER — HYDRALAZINE HCL 20 MG/ML IJ SOLN: 5.0000 mg | INTRAMUSCULAR | Status: DC | PRN

## 2017-01-30 MED ORDER — ATORVASTATIN CALCIUM 20 MG PO TABS
20.0000 mg | ORAL_TABLET | Freq: Every day | ORAL | Status: DC
Start: 2017-01-31 — End: 2017-02-03
  Administered 2017-01-31 – 2017-02-03 (×4): 20 mg via ORAL
  Filled 2017-01-30 (×4): qty 1

## 2017-01-30 MED ORDER — DEXTROSE-NACL 5-0.45 % IV SOLN
INTRAVENOUS | Status: DC
  Administered 2017-01-30 – 2017-01-31 (×3): via INTRAVENOUS
  Administered 2017-02-01: 150 mL/h via INTRAVENOUS
  Administered 2017-02-01: via INTRAVENOUS

## 2017-01-30 MED ORDER — NITROGLYCERIN 0.4 MG SL SUBL: 0.4000 mg | SUBLINGUAL_TABLET | SUBLINGUAL | Status: DC | PRN

## 2017-01-30 MED ORDER — METOPROLOL SUCCINATE ER 25 MG PO TB24
12.5000 mg | ORAL_TABLET | Freq: Every day | ORAL | Status: DC
  Administered 2017-01-31 – 2017-02-03 (×4): 12.5 mg via ORAL
  Filled 2017-01-30 (×4): qty 1

## 2017-01-30 MED ORDER — FLUTICASONE PROPIONATE 50 MCG/ACT NA SUSP
2.0000 | Freq: Every day | NASAL | Status: DC
  Administered 2017-01-31 – 2017-02-03 (×4): 2 via NASAL
  Filled 2017-01-30: qty 16

## 2017-01-30 MED ORDER — HYDROCORTISONE ACETATE 25 MG RE SUPP
25.0000 mg | Freq: Two times a day (BID) | RECTAL | Status: DC | PRN
  Filled 2017-01-30: qty 1

## 2017-01-30 MED ORDER — SODIUM CHLORIDE 0.9 % IV SOLN: INTRAVENOUS | Status: DC

## 2017-01-30 MED ORDER — ASPIRIN EC 81 MG PO TBEC
81.0000 mg | DELAYED_RELEASE_TABLET | Freq: Every day | ORAL | Status: DC
  Administered 2017-01-31 – 2017-02-03 (×4): 81 mg via ORAL
  Filled 2017-01-30 (×4): qty 1

## 2017-01-30 MED ORDER — ACETAMINOPHEN 325 MG PO TABS
650.0000 mg | ORAL_TABLET | Freq: Four times a day (QID) | ORAL | Status: DC | PRN
  Administered 2017-02-01: 650 mg via ORAL
  Filled 2017-01-30: qty 2

## 2017-01-30 MED ORDER — SODIUM CHLORIDE 0.9 % IV SOLN
INTRAVENOUS | Status: DC
  Administered 2017-01-30: 125 mL/h via INTRAVENOUS

## 2017-01-30 MED ORDER — SODIUM CHLORIDE 0.9 % IV BOLUS (SEPSIS): 500.0000 mL | Freq: Once | INTRAVENOUS | Status: DC

## 2017-01-30 MED ORDER — PANTOPRAZOLE SODIUM 40 MG PO TBEC
40.0000 mg | DELAYED_RELEASE_TABLET | Freq: Every day | ORAL | Status: DC
  Administered 2017-01-31 – 2017-02-03 (×4): 40 mg via ORAL
  Filled 2017-01-30 (×5): qty 1

## 2017-01-30 MED ORDER — SODIUM CHLORIDE 0.9 % IV SOLN
INTRAVENOUS | Status: DC
  Administered 2017-01-31: 7.2 [IU]/h via INTRAVENOUS
  Filled 2017-01-30 (×2): qty 1

## 2017-01-30 MED ORDER — MEGESTROL ACETATE 20 MG PO TABS
20.0000 mg | ORAL_TABLET | Freq: Two times a day (BID) | ORAL | Status: DC
  Administered 2017-01-30 – 2017-01-31 (×2): 20 mg via ORAL
  Filled 2017-01-30 (×2): qty 1

## 2017-01-30 MED ORDER — MAGNESIUM 250 MG PO TABS: 250.0000 mg | ORAL_TABLET | Freq: Every day | ORAL | Status: DC

## 2017-01-30 MED ORDER — LEVOFLOXACIN 500 MG PO TABS
500.0000 mg | ORAL_TABLET | Freq: Every day | ORAL | Status: DC
  Administered 2017-01-31 – 2017-02-03 (×4): 500 mg via ORAL
  Filled 2017-01-30 (×4): qty 1

## 2017-01-30 MED ORDER — ENOXAPARIN SODIUM 40 MG/0.4ML ~~LOC~~ SOLN
40.0000 mg | SUBCUTANEOUS | Status: DC
  Administered 2017-01-30 – 2017-02-02 (×4): 40 mg via SUBCUTANEOUS
  Filled 2017-01-30 (×5): qty 0.4

## 2017-01-30 MED ORDER — SODIUM CHLORIDE 0.9 % IV SOLN: INTRAVENOUS | Status: AC

## 2017-01-30 MED ORDER — SODIUM CHLORIDE 0.9 % IV SOLN
INTRAVENOUS | Status: DC
  Administered 2017-01-30: 3.3 [IU]/h via INTRAVENOUS
  Filled 2017-01-30: qty 1

## 2017-01-30 MED ORDER — DEXTROSE-NACL 5-0.45 % IV SOLN: INTRAVENOUS | Status: DC

## 2017-01-30 MED ORDER — MONTELUKAST SODIUM 10 MG PO TABS
10.0000 mg | ORAL_TABLET | Freq: Every day | ORAL | Status: DC
  Administered 2017-01-30 – 2017-02-02 (×4): 10 mg via ORAL
  Filled 2017-01-30 (×5): qty 1

## 2017-01-30 MED ORDER — TAMSULOSIN HCL 0.4 MG PO CAPS
0.4000 mg | ORAL_CAPSULE | Freq: Every day | ORAL | Status: DC
  Administered 2017-01-30 – 2017-02-02 (×4): 0.4 mg via ORAL
  Filled 2017-01-30 (×4): qty 1

## 2017-01-30 MED ORDER — ADULT MULTIVITAMIN W/MINERALS CH
ORAL_TABLET | Freq: Every day | ORAL | Status: DC
  Administered 2017-01-31 – 2017-02-03 (×4): 1 via ORAL
  Filled 2017-01-30 (×4): qty 1

## 2017-01-30 NOTE — ED Notes (Signed)
Pt reports he just peed in lobby before coming back to room. Pt attempted to void. Unable to go. Pt will try again shortly.

## 2017-01-30 NOTE — Progress Notes (Signed)
CRITICAL VALUE ALERT  Critical value received:  Lactic Acid 2.6  Date of notification:  01/30/2017  Time of notification:  2235  Critical value read back:Yes.    Nurse who received alert:  Pieter Partridge, RN  MD notified (1st page):  Dr. Georges Mouse  Time of first page:  2240  MD notified (2nd page):  Time of second page:  Responding MD:  Dr. Georges Mouse  Time MD responded:  2243  Per MD will re-check LA with morning labs and monitor. Likely due to DKA and Metformin use.   Milford Cage, RN

## 2017-01-30 NOTE — H&P (Addendum)
History and Physical    Michael Guzman BTD:[id number] DOB: 02-16-1946 DOA: 01/30/2017  Referring MD/NP/PA:   PCP: [name], MD   Patient coming from:  The patient is coming from home.  At baseline, pt is independent for most of ADL.   Chief Complaint: Generalized weakness, polydipsia, polyuria  HPI: Michael Guzman is a 71 y.o. male with medical history significant of hypertension, diabetes mellitus, GERD, CAD, chronic kidney disease-stage II, chronic back pain, prostate cancer (s/p of radiation therapy), who presents with generalized weakness, polydipsia and polyuria.  Patient states that he has been feeling weak for more than a week. He has generalized weakness, but no unilateral weakness, numbness or tingling in extremities. No facial droop or slurred speech. He he can walk normally before, but has to use cane now. Patient denies chest pain, SOB, cough, fever or chills. No nausea, vomiting, diarrhea, abdominal pain. Denies hematuria, dysuria or burning on urination. Of note, patient is taking Levaquin for acute sinusitis since 5/19 (supposed to take it from 5/19-5/26).  ED Course: pt was found to have DAK (AG 21, potassium 5.3, bicarbonate 7, blood sugar 444), WBC 10.3, lactate 1.49, 2.39, pseudohyponatremia, worsening renal function, temperature 99.5, tachycardia, O2 sat 99% on room air, negative chest x-ray. Patient is admitted to stepdown as inpatient.  Review of Systems:   General: no fevers, chills, no changes in body weight, has poor appetite, has fatigue HEENT: no blurry vision, hearing changes or sore throat Respiratory: no dyspnea, coughing, wheezing CV: no chest pain, no palpitations GI: no nausea, vomiting, abdominal pain, diarrhea, constipation GU: no dysuria, burning on urination, increased urinary frequency, hematuria  Ext: no leg edema Neuro: no unilateral weakness, numbness, or tingling, no vision change or hearing loss Skin: no rash, no skin tear. MSK: No  muscle spasm, no deformity, no limitation of range of movement in spin Heme: No easy bruising.  Travel history: No recent long distant travel.  Allergy: No Known Allergies  Past Medical History:  Diagnosis Date  . Arthritis   . CAD (coronary artery disease)   . Chronic back pain   . Chronically dry eyes   . Diabetes mellitus without complication ([place])   . Elevated PSA    being monitored  . GERD (gastroesophageal reflux disease)   . Hypertension   . Prostate cancer [facility])     Past Surgical History:  Procedure Laterality Date  . APPENDECTOMY    . CARDIAC SURGERY     3 stent placed.  . COLONOSCOPY    . LEFT HEART CATHETERIZATION WITH CORONARY ANGIOGRAM N/A 06/27/2012   Procedure: LEFT HEART CATHETERIZATION WITH CORONARY ANGIOGRAM;  Surgeon: Candee Furbish, MD;  Location: [facility] C[facility] LAB;  Service: Cardiovascular;  Laterality: N/A;  . LEFT HEART CATHETERIZATION WITH CORONARY ANGIOGRAM N/A 07/25/2012   Procedure: LEFT HEART CATHETERIZATION WITH CORONARY ANGIOGRAM;  Surgeon: Sueanne Margarita, MD;  Location: [place] CATH LAB;  Service: Cardiovascular;  Laterality: N/A;  . PROSTATE BIOPSY    . RADIOLOGY WITH ANESTHESIA N/A 12/03/2015   Procedure: MRI LUMBAR SPINE;  Surgeon: Medication Radiologist, MD;  Location: [place];  Service: Radiology;  Laterality: N/A;    Social History:  reports that he quit smoking about 5 years ago. He quit after 50.00 years of use. He has never used smokeless tobacco. He reports that he drinks alcohol. He reports that he does not use drugs.  Family History:  Family History  Problem Relation Age of Onset  . Diabetes Sister  Prior to Admission medications   Medication Sig Start Date End Date Taking? Authorizing Provider  Artificial Tear Ointment (DRY EYES OP) Place 1 drop into both eyes daily as needed (for dry eyes).    [provider]  aspirin 81 MG tablet Take 81 mg by mouth daily.    [provider]  atorvastatin (LIPITOR) 20 MG tablet Take 1  tablet (20 mg total) by mouth daily. 07/13/16 11/09/16  Jerline Pain, MD  Blood Glucose Monitoring Suppl (ONE TOUCH ULTRA SYSTEM KIT) w/Device KIT Check blood sugar 1 time daily-DX-E11.22 04/09/16   [name], MD  Cholecalciferol (VITAMIN D3) 5000 UNITS CAPS Take 5,000 Units by mouth 2 (two) times daily.    [provider]  empagliflozin (JARDIANCE) 25 MG TABS tablet Take 25 mg by mouth daily. 10/26/16   [name], MD  enalapril (VASOTEC) 2.5 MG tablet Take 1 tablet (2.5 mg total) by mouth daily. 04/09/16   Eileen Stanford, PA-C  fluticasone (FLONASE) 50 MCG/ACT nasal spray Place 2 sprays into both nostrils daily. 04/23/16   Forcucci, Courtney, PA-C  gabapentin (NEURONTIN) 600 MG tablet TAKE 1 TABLET 3 TIMES A DAY AS NEEDED FOR PAIN 12/04/16   Vicie Mutters, PA-C  glipiZIDE (GLUCOTROL) 5 MG tablet TAKE 1 TABLET BY MOUTH 3  TIMES DAILY FOR GLUCOSE  OVER 200 OR AS DIRECTED BY  YOUR DOCTOR. 01/19/17   Vicie Mutters, PA-C  glucose blood test strip Check blood sugar 1 time daily-DX-E11.22 04/09/16   [name], MD  hydrocortisone (ANUSOL-HC) 25 MG suppository Place 25 mg rectally 2 (two) times daily as needed for hemorrhoids or itching.    [provider]  levofloxacin (LEVAQUIN) 500 MG tablet Take 1 tablet (500 mg total) by mouth daily. 01/27/17 02/06/17  [name], MD  Magnesium 250 MG TABS Take 250 mg by mouth daily.    [provider]  megestrol (MEGACE) 20 MG tablet Take 20 mg by mouth 2 (two) times daily.    [provider]  metFORMIN (GLUCOPHAGE-XR) 500 MG 24 hr tablet TAKE 1 TO 2 TABLETS BY  MOUTH TWO TIMES DAILY AS  DIRECTED FOR DIABETES 12/04/16   Vicie Mutters, PA-C  metoprolol succinate (TOPROL-XL) 25 MG 24 hr tablet Take 0.5 tablets (12.5 mg total) by mouth daily. Take with or immediately following a meal. 04/09/16   Eileen Stanford, PA-C  montelukast (SINGULAIR) 10 MG tablet  01/02/17   [provider]  Multiple  Vitamins-Minerals (MULTIVITAMIN ADULT PO) Take 1 tablet by mouth daily.    [provider]  nitroGLYCERIN (NITROSTAT) 0.4 MG SL tablet PLACE ONE TABLET UNDER THE TONGUE EVERY 5 MINUTES AS NEEDED FOR CHEST PAIN. 12/18/16   [name], MD  Omega-3 Fatty Acids (FISH OIL) 1000 MG CAPS Take 1,000 mg by mouth daily.     [provider]  ondansetron (ZOFRAN ODT) 4 MG disintegrating tablet Take 1 tablet (4 mg total) by mouth every 8 (eight) hours as needed for nausea or vomiting. 01/16/16   Carmin Muskrat, MD  OVER THE COUNTER MEDICATION Delsum cough medicine    [provider]  pantoprazole (PROTONIX) 40 MG tablet TAKE 1 TABLET BY MOUTH  DAILY 11/10/16   [name], MD  phenazopyridine (PYRIDIUM) 95 MG tablet Take 95 mg by mouth 3 (three) times daily as needed for pain.    [provider]  predniSONE (DELTASONE) 20 MG tablet 1 tab 3 x day for 2 days, then 1 tab 2 x day for 2  days, then 1 tab 1 x day for 3 days 01/27/17   [name], MD  tamsulosin (FLOMAX) 0.4 MG CAPS capsule Take 0.4 mg by mouth at bedtime.    [provider]    Physical Exam: Vitals:   01/30/17 1745 01/30/17 1800 01/30/17 1845 01/30/17 1915  BP: (!) 126/99 115/84 (!) 138/94 (!) 130/94  Pulse: 90 90 75 82  Resp: 18  19 16   Temp:      TempSrc:      SpO2: 99% 100% 100% 100%  Weight:      Height:       General: Not in acute distress. Looks very weak. Has has a dry mucus and membrane HEENT:       Eyes: PERRL, EOMI, no scleral icterus.       ENT: No discharge from the ears and nose, no pharynx injection, no tonsillar enlargement.        Neck: No JVD, no bruit, no mass felt. Heme: No neck lymph node enlargement. Cardiac: S1/S2, RRR, No murmurs, No gallops or rubs. Respiratory: No rales, wheezing, rhonchi or rubs. GI: Soft, nondistended, nontender, no rebound pain, no organomegaly, BS present. GU: No hematuria Ext: No pitting leg edema bilaterally. 2+DP/PT pulse  bilaterally. Musculoskeletal: No joint deformities, No joint redness or warmth, no limitation of ROM in spin. Skin: No rashes.  Neuro: Alert, oriented X3, cranial nerves II-XII grossly intact, moves all extremities normally.   Psych: Patient is not psychotic, no suicidal or hemocidal ideation.  Labs on Admission: I have personally reviewed following labs and imaging studies  CBC:  Recent Labs Lab 01/27/17 1729 01/30/17 1454  WBC 5.2 10.3  NEUTROABS 3,536 9.1*  HGB 14.7 14.7  HCT 45.0 43.2  MCV 94.1 90.6  PLT 204 341   Basic Metabolic Panel:  Recent Labs Lab 01/27/17 1729 01/30/17 1454 01/30/17 1805  NA 132* 125*  --   K 4.7 5.3*  --   CL 101 97*  --   CO2 14* 7*  --   GLUCOSE 247* 444*  --   BUN 26* 39*  --   CREATININE 1.24 1.64*  --   CALCIUM 10.4* 10.2  --   MG  --   --  2.9*  PHOS  --   --  4.6   GFR: Estimated Creatinine Clearance: 42.5 mL/min (A) (by C-G formula based on SCr of 1.64 mg/dL (H)). Liver Function Tests:  Recent Labs Lab 01/30/17 1454  AST 15  ALT 16*  ALKPHOS 72  BILITOT 1.7*  PROT 7.7  ALBUMIN 4.2   No results for input(s): LIPASE, AMYLASE in the last 168 hours. No results for input(s): AMMONIA in the last 168 hours. Coagulation Profile: No results for input(s): INR, PROTIME in the last 168 hours. Cardiac Enzymes:  Recent Labs Lab 01/27/17 1729  CKTOTAL 51   BNP (last 3 results) No results for input(s): PROBNP in the last 8760 hours. HbA1C: No results for input(s): HGBA1C in the last 72 hours. CBG:  Recent Labs Lab 01/30/17 1936  GLUCAP 385*   Lipid Profile: No results for input(s): CHOL, HDL, LDLCALC, TRIG, CHOLHDL, LDLDIRECT in the last 72 hours. Thyroid Function Tests: No results for input(s): TSH, T4TOTAL, FREET4, T3FREE, THYROIDAB in the last 72 hours. Anemia Panel: No results for input(s): VITAMINB12, FOLATE, FERRITIN, TIBC, IRON, RETICCTPCT in the last 72 hours. Urine analysis:    Component Value Date/Time     COLORURINE STRAW (A) 01/30/2017 [place] 01/30/2017 1923  LABSPEC 1.026 01/30/2017 1923   LABSPEC 1.020 10/14/2016 1230   PHURINE 5.0 01/30/2017 1923   GLUCOSEU >=500 (A) 01/30/2017 1923   GLUCOSEU 2,000 10/14/2016 1230   HGBUR NEGATIVE 01/30/2017 1923   BILIRUBINUR NEGATIVE 01/30/2017 1923   BILIRUBINUR Negative 10/14/2016 1230   KETONESUR 80 (A) 01/30/2017 1923   PROTEINUR 30 (A) 01/30/2017 1923   UROBILINOGEN 0.2 10/14/2016 1230   NITRITE NEGATIVE 01/30/2017 1923   LEUKOCYTESUR NEGATIVE 01/30/2017 1923   LEUKOCYTESUR Negative 10/14/2016 1230   Sepsis Labs: '@LABRCNTIP'$ (procalcitonin:4,lacticidven:4) )No results found for this or any previous visit (from the past 240 hour(s)).   Radiological Exams on Admission: Dg Chest 2 View  Result Date: 01/30/2017 CLINICAL DATA:  Weakness, shortness of breath, cough EXAM: CHEST  2 VIEW COMPARISON:  12/03/2016 FINDINGS: Heart and mediastinal contours are within normal limits. No focal opacities or effusions. No acute bony abnormality. IMPRESSION: No active cardiopulmonary disease. Electronically Signed   By: Rolm Baptise M.D.   On: 01/30/2017 15:28   Ct Head Wo Contrast  Result Date: 01/30/2017 CLINICAL DATA:  Weakness and fatigue since yesterday. EXAM: CT HEAD WITHOUT CONTRAST TECHNIQUE: Contiguous axial images were obtained from the base of the skull through the vertex without intravenous contrast. COMPARISON:  None. FINDINGS: Brain: The ventricles and cisterns are within normal. There is evidence of age related atrophic change. There is minimal chronic ischemic microvascular disease. Possible tiny old lacunar infarct over the anterior limb right internal capsule. No mass, mass effect, shift midline structures or acute hemorrhage. No evidence of acute infarction. Vascular: No hyperdense vessel or unexpected calcification. Skull: Within normal. Sinuses/Orbits: Within normal. Other: None. IMPRESSION: No acute intracranial findings.  Minimal chronic ischemic microvascular disease and age related atrophic change. Electronically Signed   By: Marin Olp M.D.   On: 01/30/2017 19:28     EKG: Independently reviewed.  Sinus rhythm, early R-wave progression, no T-wave peaking.   Assessment/Plan Principal Problem:   DKA (diabetic ketoacidoses) ([place]) Active Problems:   Hypertension (1991)   T2_NIDDM w/ CKD2 (GFR 64 ml/min)   GERD   ASCAD s/p PTCA (06/2012)   Mixed hyperlipidemia   Malignant neoplasm of prostate ([facility])   Acute sinusitis   Elevated lactic acid level   Hyperkalemia   Acute renal failure superimposed on stage 2 chronic kidney disease ([place])   DKA (diabetic ketoacidoses) ([place]): Patient has generalized weakness, polyuria and polydipsia is likely caused by DKA. AG 21, potassium 5.3, bicarbonate 7, blood sugar 444. Patient is currently being treated for acute sinusitis with Levaquin, which may be the triggering factor. Chest x-ray negative. Pending urinalysis.  - Admit to stepdown  - will give total of 3L of NS bolus  - start DKA protocol with BMP q4h - IVF: NS 125 cc/h; will switch to D5-1/2NS when CBG<250 - replete K as needed - Zofran prn nausea  - NPO  - blood culture x 2  AoCKD-II: Baseline Cre is 1.0 to 1.2. pt's Cre is 1.64 on admission. Likely due to prerenal secondary to dehydration and continuation of ACEI - IVF as above - Check FeNa  - Follow up renal function by BMP - Hold enalapril  HTN: -hold enalapril due to worsening renal function -Continue metoprolol -IVF had hydralazine when necessary  DM-II: Last A1c 10.5 on 01/15/17, poorly controled. Patient is taking metformin, glipizide and Jardiance at home. Now has Crump.  -on DAK protocol now  GERD: -Protonix  CAD:  ASCAD s/p PTCA (06/2012). No CP -Continue aspirin, Lipitor, metoprolol History  of present illness a nitroglycerin  HLD: -lipitor  Hyperkalemia: due to [place]. Expect correction with Insulin gtt. -f/u by BMP  Elevated  lactic acid level: likely due to dehydration -IV fluid as above -Trend Lactic acid level  Malignant neoplasm of prostate ([place]): s/p of XTR. On Lupron, last dose was on 3/18. -f/u with his oncologist, Dr. Tammi Klippel  Acute sinusitis: on levaquine -continue levaquin from 5/19 to 5/26    DVT ppx: SQ Lovenox Code Status: Full code Family Communication:  Yes, patient's wife at bed side Disposition Plan:  Anticipate discharge back to previous home environment Consults called:  none Admission status: Obs / tele   Date of Service 01/30/2017    Ivor Costa [facility] Pager [phone]  If 7PM-7AM, please contact night-coverage [email or web address] Password [email or web address] 01/30/2017, 8:08 PM

## 2017-01-30 NOTE — ED Triage Notes (Signed)
Pt c/o increasing fatigue, weakness over past few days-- hoarse sounding voice-- recently finished radiation for prostate cancer in February--pt normally is independent-- is now walking with a cane, has no energy per family.

## 2017-01-30 NOTE — ED Provider Notes (Signed)
[facility] DEPT Provider Note   CSN: [id number] Arrival date & time: 01/30/17  1409     History   Chief Complaint Chief Complaint  Patient presents with  . Weakness  . Fatigue  . Sore Throat    HPI Michael Guzman is a 71 y.o. male. Patient is a 71 year old male with a history of diabetes who presents with diffuse weakness, fatigue, polyuria, polydipsia since Monday. He felt like he was coming down with a cold over the weekend but denies any fevers, nausea, vomiting, headache, chest pain, shortness breath. No new changes in medication. He denies any dysuria just increased urination. He does have a history of prostate cancer and recently started on chemotherapy in March. He has not tried anything for this and symptoms are unrelieved with increased by mouth fluid intake The history is provided by the patient.    Past Medical History:  Diagnosis Date  . Arthritis   . CAD (coronary artery disease)   . Chronic back pain   . Chronically dry eyes   . Diabetes mellitus without complication ([place])   . Elevated PSA    being monitored  . GERD (gastroesophageal reflux disease)   . Hypertension   . Prostate cancer [facility])     Patient Active Problem List   Diagnosis Date Noted  . Low serum thyroid stimulating hormone (TSH) 05/12/2016  . Thyroiditis 05/12/2016  . Malignant neoplasm of prostate ([place]) 05/05/2016  . Diabetic peripheral neuropathy ([place]) 03/06/2015  . Medication management 07/24/2014  . Vitamin D deficiency 03/06/2014  . Mixed hyperlipidemia 06/28/2012  . ASCAD s/p PTCA (06/2012) 06/27/2012  . Hypertension (1991) 06/25/2012  . T2_NIDDM w/ CKD2 (GFR 64 ml/min) 06/25/2012  . GERD 06/25/2012    Past Surgical History:  Procedure Laterality Date  . APPENDECTOMY    . CARDIAC SURGERY     3 stent placed.  . COLONOSCOPY    . LEFT HEART CATHETERIZATION WITH CORONARY ANGIOGRAM N/A 06/27/2012   Procedure: LEFT HEART CATHETERIZATION WITH CORONARY ANGIOGRAM;  Surgeon:  Candee Furbish, MD;  Location[facility] CATH LAB;  Service: Cardiovascular;  Laterality: N/A;  . LEFT HEART CATHETERIZATION WITH CORONARY ANGIOGRAM N/A 07/25/2012   Procedure: LEFT HEART CATHETERIZATION WITH CORONARY ANGIOGRAM;  Surgeon: Sueanne Margarita, MD;  Location: [place] CATH LAB;  Service: Cardiovascular;  Laterality: N/A;  . PROSTATE BIOPSY    . RADIOLOGY WITH ANESTHESIA N/A 12/03/2015   Procedure: MRI LUMBAR SPINE;  Surgeon: Medication Radiologist, MD;  Location: [place];  Service: Radiology;  Laterality: N/A;       Home Medications    Prior to Admission medications   Medication Sig Start Date End Date Taking? Authorizing Provider  Artificial Tear Ointment (DRY EYES OP) Place 1 drop into both eyes daily as needed (for dry eyes).    [provider]  aspirin 81 MG tablet Take 81 mg by mouth daily.    [provider]  atorvastatin (LIPITOR) 20 MG tablet Take 1 tablet (20 mg total) by mouth daily. 07/13/16 11/09/16  Jerline Pain, MD  Blood Glucose Monitoring Suppl (ONE TOUCH ULTRA SYSTEM KIT) w/Device KIT Check blood sugar 1 time daily-DX-E11.22 04/09/16   [name], MD  Cholecalciferol (VITAMIN D3) 5000 UNITS CAPS Take 5,000 Units by mouth 2 (two) times daily.    [provider]  empagliflozin (JARDIANCE) 25 MG TABS tablet Take 25 mg by mouth daily. 10/26/16   [name], MD  enalapril (VASOTEC) 2.5 MG tablet Take 1 tablet (2.5 mg total) by mouth  daily. 04/09/16   Eileen Stanford, PA-C  fluticasone [facility]) 50 MCG/ACT nasal spray Place 2 sprays into both nostrils daily. 04/23/16   Forcucci, Courtney, PA-C  gabapentin (NEURONTIN) 600 MG tablet TAKE 1 TABLET 3 TIMES A DAY AS NEEDED FOR PAIN 12/04/16   Vicie Mutters, PA-C  glipiZIDE (GLUCOTROL) 5 MG tablet TAKE 1 TABLET BY MOUTH 3  TIMES DAILY FOR GLUCOSE  OVER 200 OR AS DIRECTED BY  YOUR DOCTOR. 01/19/17   Vicie Mutters, PA-C  glucose blood test strip Check blood sugar 1 time daily-DX-E11.22 04/09/16   [name], MD  hydrocortisone (ANUSOL-HC) 25 MG suppository Place 25 mg rectally 2 (two) times daily as needed for hemorrhoids or itching.    [provider]  levofloxacin (LEVAQUIN) 500 MG tablet Take 1 tablet (500 mg total) by mouth daily. 01/27/17 02/06/17  [name], MD  Magnesium 250 MG TABS Take 250 mg by mouth daily.    [provider]  megestrol (MEGACE) 20 MG tablet Take 20 mg by mouth 2 (two) times daily.    [provider]  metFORMIN (GLUCOPHAGE-XR) 500 MG 24 hr tablet TAKE 1 TO 2 TABLETS BY  MOUTH TWO TIMES DAILY AS  DIRECTED FOR DIABETES 12/04/16   Vicie Mutters, PA-C  metoprolol succinate (TOPROL-XL) 25 MG 24 hr tablet Take 0.5 tablets (12.5 mg total) by mouth daily. Take with or immediately following a meal. 04/09/16   Eileen Stanford, PA-C  montelukast (SINGULAIR) 10 MG tablet  01/02/17   [provider]  Multiple Vitamins-Minerals (MULTIVITAMIN ADULT PO) Take 1 tablet by mouth daily.    [provider]  nitroGLYCERIN (NITROSTAT) 0.4 MG SL tablet PLACE ONE TABLET UNDER THE TONGUE EVERY 5 MINUTES AS NEEDED FOR CHEST PAIN. 12/18/16   [name], MD  Omega-3 Fatty Acids (FISH OIL) 1000 MG CAPS Take 1,000 mg by mouth daily.     [provider]  ondansetron (ZOFRAN ODT) 4 MG disintegrating tablet Take 1 tablet (4 mg total) by mouth every 8 (eight) hours as needed for nausea or vomiting. 01/16/16   Carmin Muskrat, MD  OVER THE COUNTER MEDICATION Delsum cough medicine    [provider]  pantoprazole (PROTONIX) 40 MG tablet TAKE 1 TABLET BY MOUTH  DAILY 11/10/16   [name], MD  phenazopyridine (PYRIDIUM) 95 MG tablet Take 95 mg by mouth 3 (three) times daily as needed for pain.    [provider]  predniSONE (DELTASONE) 20 MG tablet 1 tab 3 x day for 2 days, then 1 tab 2 x day for 2 days, then 1 tab 1 x day for 3 days 01/27/17   [name], MD  tamsulosin (FLOMAX) 0.4 MG CAPS capsule Take 0.4 mg by  mouth at bedtime.    [provider]    Family History Family History  Problem Relation Age of Onset  . Diabetes Sister     Social History Social History  Substance Use Topics  . Smoking status: Former Smoker    Years: 50.00    Quit date: 07/27/2011  . Smokeless tobacco: Never Used  . Alcohol use Yes     Comment: occasional     Allergies   Patient has no known allergies.   Review of Systems Review of Systems  Constitutional: Negative for fever.  HENT: Negative for voice change.   Eyes: Negative.   Respiratory: Negative for cough and shortness of breath.   Cardiovascular: Negative for chest pain.  Gastrointestinal: Negative for abdominal pain.  Endocrine: Positive for polydipsia and polyuria.  Genitourinary: Negative for dysuria.  Musculoskeletal: Negative.   Skin: Negative.   Neurological: Negative.   All other systems reviewed and are negative.    Physical Exam Updated Vital Signs ED Triage Vitals  Enc Vitals Group     BP 01/30/17 1450 112/88     Pulse Rate 01/30/17 1450 (!) 102     Resp 01/30/17 1450 18     Temp 01/30/17 1450 99.5 F (37.5 C)     Temp Source 01/30/17 1646 Oral     SpO2 01/30/17 1450 100 %     Weight 01/30/17 1452 182 lb (82.6 kg)     Height 01/30/17 1452 _0  (1.753 m)     Head Circumference --      Peak Flow --      Pain Score 01/30/17 1451 0     Pain Loc --      Pain Edu? --      Excl. in [place]? --      Physical Exam  Constitutional: He is oriented to person, place, and time. He appears well-developed and well-nourished.  Extreme diffuse weakness to the point where he could not get out of the wheelchair onto the stretcher  HENT:  Head: Normocephalic and atraumatic.  Mouth/Throat: Oropharynx is clear and moist.  Eyes: Conjunctivae and EOM are normal. Pupils are equal, round, and reactive to light.  Neck: Neck supple.  Cardiovascular: Regular rhythm.   No murmur heard. tachycardic  Pulmonary/Chest: Effort normal and  breath sounds normal. No respiratory distress.  Abdominal: Soft. There is no tenderness. There is no guarding.  Musculoskeletal: He exhibits no edema or tenderness.  Neurological: He is alert and oriented to person, place, and time. No cranial nerve deficit. He exhibits normal muscle tone. Coordination normal.  Skin: Skin is warm and dry.  Psychiatric: He has a normal mood and affect.  Nursing note and vitals reviewed.    ED Treatments / Results  Labs (all labs ordered are listed, but only abnormal results are displayed)   EKG  EKG Interpretation None       Radiology   Procedures Procedures (including critical care time)  Medications Ordered in ED Medications - No data to display   Initial Impression / Assessment and Plan / ED Course  I have reviewed the triage vital signs and the nursing notes.  Pertinent labs & imaging results that were available during my care of the patient were reviewed by me and considered in my medical decision making (see chart for details).     Patient is 71 year old male with above past medical history presents with fatigue, polyuria, polydipsia. Vital signs as above and exam without any acute findings. Initial labs obtained with hyperglycemia, hyponatremia, anion gap as well as acidosis. At this time concern for possible DKA. Chest x-ray obtained without any obvious pulmonary pathology. Patient given IV fluids and will start on an insulin drip. UA without signs of UTI but does show ketones. At this time patient be admitted to the hospitalist for further management and evaluation of suspected DKA  Final Clinical Impressions(s) / ED Diagnoses   Final diagnoses:  None    New Prescriptions New Prescriptions   No medications on file     Heriberto Antigua, MD 01/31/17 1510    Mesner, Corene Cornea, MD 02/01/17 Ofilia Neas    Merrily Pew, MD 02/01/17 0025

## 2017-01-30 NOTE — ED Provider Notes (Signed)
I saw and evaluated the patient, reviewed the resident's note and I agree with the findings and plan with the following exceptions.   Here with a months worth of worsening polyuria, polydipsia, weakness and increased HbA1c. On exam, he is dry. VSS. Labs c/w likely DKA but has never been before. Will get vbg, and likely start insulin drip.  Possibly initiated by starting lupron?  CRITICAL CARE Performed by: Merrily Pew Total critical care time: 35 minutes Critical care time was exclusive of separately billable procedures and treating other patients. Critical care was necessary to treat or prevent imminent or life-threatening deterioration. Critical care was time spent personally by me on the following activities: development of treatment plan with patient and/or surrogate as well as nursing, discussions with consultants, evaluation of patient's response to treatment, examination of patient, obtaining history from patient or surrogate, ordering and performing treatments and interventions, ordering and review of laboratory studies, ordering and review of radiographic studies, pulse oximetry and re-evaluation of patient's condition.    EKG Interpretation  Date/Time:  Saturday Jan 30 2017 18:02:09 EDT Ventricular Rate:  87 PR Interval:    QRS Duration: 97 QT Interval:  375 QTC Calculation: 452 R Axis:   8 Text Interpretation:  Sinus rhythm Low voltage, extremity and precordial leads Abnormal R-wave progression, early transition Confirmed by Merrily Pew [phone]) on 01/30/2017 6:17:21 PM         Bianka Liberati, Corene Cornea, MD 02/01/17 0025

## 2017-01-30 NOTE — ED Notes (Signed)
Family at bedside. 

## 2017-01-31 DIAGNOSIS — E875 Hyperkalemia: Secondary | ICD-10-CM

## 2017-01-31 DIAGNOSIS — E871 Hypo-osmolality and hyponatremia: Secondary | ICD-10-CM

## 2017-01-31 DIAGNOSIS — N179 Acute kidney failure, unspecified: Secondary | ICD-10-CM

## 2017-01-31 DIAGNOSIS — E1110 Type 2 diabetes mellitus with ketoacidosis without coma: Principal | ICD-10-CM

## 2017-01-31 DIAGNOSIS — N182 Chronic kidney disease, stage 2 (mild): Secondary | ICD-10-CM

## 2017-01-31 LAB — BASIC METABOLIC PANEL
Anion gap: 16 — ABNORMAL HIGH (ref 5–15)
Anion gap: 14 (ref 5–15)
Anion gap: 13 (ref 5–15)
Anion gap: 9 (ref 5–15)
Anion gap: 8 (ref 5–15)
Anion gap: 8 (ref 5–15)
BUN: 35 mg/dL — AB (ref 6–20)
BUN: 31 mg/dL — ABNORMAL HIGH (ref 6–20)
BUN: 27 mg/dL — ABNORMAL HIGH (ref 6–20)
BUN: 24 mg/dL — ABNORMAL HIGH (ref 6–20)
BUN: 22 mg/dL — AB (ref 6–20)
BUN: 20 mg/dL (ref 6–20)
CALCIUM: 8.9 mg/dL (ref 8.9–10.3)
CHLORIDE: 102 mmol/L (ref 101–111)
CHLORIDE: 102 mmol/L (ref 101–111)
CHLORIDE: 102 mmol/L (ref 101–111)
CHLORIDE: 106 mmol/L (ref 101–111)
CO2: 11 mmol/L — AB (ref 22–32)
CO2: 13 mmol/L — AB (ref 22–32)
CO2: 14 mmol/L — AB (ref 22–32)
CO2: 16 mmol/L — AB (ref 22–32)
CO2: 17 mmol/L — ABNORMAL LOW (ref 22–32)
CO2: 18 mmol/L — ABNORMAL LOW (ref 22–32)
CREATININE: 0.93 mg/dL (ref 0.61–1.24)
Calcium: 9.6 mg/dL (ref 8.9–10.3)
Calcium: 9.3 mg/dL (ref 8.9–10.3)
Calcium: 9.1 mg/dL (ref 8.9–10.3)
Calcium: 9.1 mg/dL (ref 8.9–10.3)
Calcium: 9.2 mg/dL (ref 8.9–10.3)
Chloride: 105 mmol/L (ref 101–111)
Chloride: 107 mmol/L (ref 101–111)
Creatinine, Ser: 1.39 mg/dL — ABNORMAL HIGH (ref 0.61–1.24)
Creatinine, Ser: 1.04 mg/dL (ref 0.61–1.24)
Creatinine, Ser: 1.04 mg/dL (ref 0.61–1.24)
Creatinine, Ser: 0.95 mg/dL (ref 0.61–1.24)
Creatinine, Ser: 0.94 mg/dL (ref 0.61–1.24)
GFR calc Af Amer: >60 mL/min (ref >60)
GFR calc non Af Amer: 50 mL/min — ABNORMAL LOW (ref >60)
GFR calc non Af Amer: >60 mL/min (ref >60)
GFR calc non Af Amer: >60 mL/min (ref >60)
GFR calc non Af Amer: >60 mL/min (ref >60)
GFR, EST AFRICAN AMERICAN: 58 mL/min — AB (ref >60)
GLUCOSE: 130 mg/dL — AB (ref 65–99)
Glucose, Bld: 212 mg/dL — ABNORMAL HIGH (ref 65–99)
Glucose, Bld: 164 mg/dL — ABNORMAL HIGH (ref 65–99)
Glucose, Bld: 160 mg/dL — ABNORMAL HIGH (ref 65–99)
Glucose, Bld: 174 mg/dL — ABNORMAL HIGH (ref 65–99)
Glucose, Bld: 151 mg/dL — ABNORMAL HIGH (ref 65–99)
POTASSIUM: 4.7 mmol/L (ref 3.5–5.1)
POTASSIUM: 3.9 mmol/L (ref 3.5–5.1)
POTASSIUM: 3.9 mmol/L (ref 3.5–5.1)
POTASSIUM: 3.2 mmol/L — AB (ref 3.5–5.1)
Potassium: 3.8 mmol/L (ref 3.5–5.1)
Potassium: 3.6 mmol/L (ref 3.5–5.1)
SODIUM: 129 mmol/L — AB (ref 135–145)
SODIUM: 129 mmol/L — AB (ref 135–145)
SODIUM: 130 mmol/L — AB (ref 135–145)
SODIUM: 132 mmol/L — AB (ref 135–145)
Sodium: 129 mmol/L — ABNORMAL LOW (ref 135–145)
Sodium: 132 mmol/L — ABNORMAL LOW (ref 135–145)

## 2017-01-31 LAB — GLUCOSE, CAPILLARY
GLUCOSE-CAPILLARY: 191 mg/dL — AB (ref 65–99)
GLUCOSE-CAPILLARY: 175 mg/dL — AB (ref 65–99)
GLUCOSE-CAPILLARY: 166 mg/dL — AB (ref 65–99)
GLUCOSE-CAPILLARY: 142 mg/dL — AB (ref 65–99)
GLUCOSE-CAPILLARY: 167 mg/dL — AB (ref 65–99)
GLUCOSE-CAPILLARY: 149 mg/dL — AB (ref 65–99)
GLUCOSE-CAPILLARY: 184 mg/dL — AB (ref 65–99)
GLUCOSE-CAPILLARY: 190 mg/dL — AB (ref 65–99)
Glucose-Capillary: 131 mg/dL — ABNORMAL HIGH (ref 65–99)
Glucose-Capillary: 135 mg/dL — ABNORMAL HIGH (ref 65–99)
Glucose-Capillary: 143 mg/dL — ABNORMAL HIGH (ref 65–99)
Glucose-Capillary: 166 mg/dL — ABNORMAL HIGH (ref 65–99)
Glucose-Capillary: 181 mg/dL — ABNORMAL HIGH (ref 65–99)
Glucose-Capillary: 190 mg/dL — ABNORMAL HIGH (ref 65–99)
Glucose-Capillary: 239 mg/dL — ABNORMAL HIGH (ref 65–99)

## 2017-01-31 LAB — LACTIC ACID, PLASMA
Lactic Acid, Venous: 1.9 mmol/L (ref 0.5–1.9)
Lactic Acid, Venous: 1 mmol/L (ref 0.5–1.9)

## 2017-01-31 LAB — MRSA PCR SCREENING: MRSA by PCR: NEGATIVE

## 2017-01-31 MED ORDER — SODIUM CHLORIDE 0.9 % IV BOLUS (SEPSIS)
500.0000 mL | Freq: Once | INTRAVENOUS | Status: AC
  Administered 2017-01-31: 500 mL via INTRAVENOUS

## 2017-01-31 MED ORDER — POTASSIUM CHLORIDE 10 MEQ/100ML IV SOLN
10.0000 meq | INTRAVENOUS | Status: AC
  Administered 2017-02-01 (×3): 10 meq via INTRAVENOUS
  Filled 2017-01-31 (×3): qty 100

## 2017-01-31 NOTE — Plan of Care (Signed)
Problem: Education: Goal: Knowledge of disease or condition will improve Outcome: Progressing Patient states he has been on antibiotics and is feeling weak. Per notes/ family patient is weaker lately since radiation in February. Also experiencing polydipsia/ polyuria.  Goal: Knowledge of the prescribed therapeutic regimen will improve Outcome: Progressing D/W family and patient about the glucose stabilizer.  Goal: Ability to describe self-care measures that may prevent or decrease complications (Diabetes Survival Skills Education) will improve Outcome: Progressing Discussed s/s of hyperglycemia and how being ill can cause fluctuations in blood sugar levels. While in hospital patient will be off of metformin. Explained to family that metformin was likely causing increased lactic acid ( as discussed with MD.)  Problem: Metabolic: Goal: Ability to maintain appropriate glucose levels will improve Outcome: Progressing CBG back to normal. Goal: Diagnostic test results will improve Outcome: Progressing Patient's Anion Gap is closed;however, CO2 remains 13 which is low.   Problem: Respiratory: Goal: Peripheral tissue perfusion will improve Outcome: Progressing Discussed importance of lovenox while in hospital. Patient's wife urged husband to comply.  Problem: Urinary Elimination: Goal: Ability to achieve and maintain adequate renal perfusion and functioning will improve Outcome: Progressing Renal labs improving. Continue to monitor.

## 2017-01-31 NOTE — Progress Notes (Signed)
PROGRESS NOTE   Michael Guzman  HQI:[id number]    DOB: 04/11/1946    DOA: 01/30/2017  PCP: [name], MD   I have briefly reviewed patients previous medical records in [facility].  Brief Narrative:  71 year old male with PMH of CAD, type II DM, HTN, GERD, prostate cancer (s/p XRT. Follows with Dr. Junious Silk, Urology & Dr. Tammi Klippel, radiation oncologist), stage II chronic kidney disease, recently treated by PCP with oral steroids and levofloxacin for acute sinusitis, resented with generalized weakness, polydipsia, polyuria and found to have DKA (blood sugar 444, anion gap 21, potassium 5.3 and bicarbonate 7). Admitted to stepdown unit and treating for DKA per protocol. Slowly improving.  Assessment & Plan:   Principal Problem:   DKA (diabetic ketoacidoses) ([place]) Active Problems:   Hypertension (1991)   T2_NIDDM w/ CKD2 (GFR 64 ml/min)   GERD   ASCAD s/p PTCA (06/2012)   Mixed hyperlipidemia   Malignant neoplasm of prostate (HCC)   Acute sinusitis   Elevated lactic acid level   Hyperkalemia   Acute renal failure superimposed on stage 2 chronic kidney disease ([place])   1. DKA and type II DM, not at goal: Noncompliant with CBG monitoring or diet. Claims compliance with oral hypoglycemics. Last A1c on 01/15/17:10.5 suggests very poor control. Current worsening hyperglycemia likely due to steroids initiated for acute sinusitis. Presented with blood sugar of 444, anion gap 21, potassium 5.3, bicarbonate 7. Admitted to stepdown unit, aggressively hydrated with IV fluids and initiated insulin drip per EKG protocol. AG, potassium and creatinine have normalized. Glycemic control has improved. Bicarbonate has improved from 7-14. Clinically still appears dry. NS bolus 500 mL 1 provided. Increase IV fluids to 150 ML per hour. Closely follow BMP) bicarbonate normalizes, transition to Lantus and SSI. Discontinue prednisone. Discussed at length with patient and daughter that he will likely need  insulin at discharge at least short-term until his glycemic control improves. They verbalized understanding. Holding all oral hypoglycemics (metformin, glipizide and Jardiance) 2. Dehydration with hyponatremia: Hyponatremia secondary to dehydration and severe hyperglycemia. Improving. Continue IV fluids and treatment for DKA. Follow BMP. 3. Acute kidney injury complicating stage II chronic kidney disease: Secondary to dehydration. Resolved. Holding enalapril. 4. Mild hyperkalemia: Secondary to acute kidney injury in DKA.Marland Kitchen Monitor closely for hypokalemia. 5. Essential hypertension: Controlled. Continue metoprolol. 6. Hyperlipidemia: Statins. 7. GERD: Continue PPI. 8. Prostate cancer (s/p XRT) & BPH: Continue tamsulosin. 9. CAD: Asymptomatic of chest pain. Continue aspirin, beta blockers and statins. 10. Generalized weakness: Secondary to problem #1. CT head without acute findings. 11. Acute sinusitis: Continue levofloxacin. Steroids discontinued.   DVT prophylaxis: Lovenox Code Status: Full Family Communication: Discussed in detail with patient's spouse at bedside. Disposition: DC home when medically improved and stable. Possibly in the next 24 hours.   Consultants:  None   Procedures:  None  Antimicrobials:  None    Subjective: Generalized weakness but better compared to on admission. No focal symptoms. Decreased urinary frequency. No dysuria, fever or chills.   ROS:  denies cough, sinus congestion, headache, dyspnea or chest pain.  Objective:  Vitals:   01/31/17 0500 01/31/17 0600 01/31/17 0700 01/31/17 0800  BP: 99/79 109/81 109/82 124/83  Pulse: 84 75 75 68  Resp: 14 12 12 11   Temp:    97.6 F (36.4 C)  TempSrc:    Oral  SpO2: 97% 99% 99% 100%  Weight:      Height:        Examination:  General exam:  Pleasant middle-aged male lying comfortably supine in bed. Oral mucosa dry. Respiratory system: Clear to auscultation. Respiratory effort normal. Cardiovascular  system: S1 & S2 heard, RRR. No JVD, murmurs, rubs, gallops or clicks. No pedal edema. Telemetry: Sinus rhythm.  Gastrointestinal system: Abdomen is nondistended, soft and nontender. No organomegaly or masses felt. Normal bowel sounds heard. Central nervous system: Alert and oriented. No focal neurological deficits. Extremities: Symmetric 5 x 5 power. Skin: No rashes, lesions or ulcers Psychiatry: Judgement and insight appear normal. Mood & affect appropriate.     Data Reviewed: I have personally reviewed following labs and imaging studies  CBC:  Recent Labs Lab 01/27/17 1729 01/30/17 1454  WBC 5.2 10.3  NEUTROABS 3,536 9.1*  HGB 14.7 14.7  HCT 45.0 43.2  MCV 94.1 90.6  PLT 204 824   Basic Metabolic Panel:  Recent Labs Lab 01/30/17 1454 01/30/17 1805 01/30/17 2044 01/30/17 2322 01/31/17 0327 01/31/17 0728  NA 125*  --  128* 129* 129* 129*  K 5.3*  --  5.6* 4.7 3.9 3.9  CL 97*  --  99* 102 102 102  CO2 7*  --  7* 11* 13* 14*  GLUCOSE 444*  --  318* 212* 164* 160*  BUN 39*  --  36* 35* 31* 27*  CREATININE 1.64*  --  1.44* 1.39* 1.04 1.04  CALCIUM 10.2  --  9.6 9.6 9.3 9.1  MG  --  2.9*  --   --   --   --   PHOS  --  4.6  --   --   --   --    Liver Function Tests:  Recent Labs Lab 01/30/17 1454  AST 15  ALT 16*  ALKPHOS 72  BILITOT 1.7*  PROT 7.7  ALBUMIN 4.2   Cardiac Enzymes:  Recent Labs Lab 01/27/17 1729  CKTOTAL 51   CBG:  Recent Labs Lab 01/31/17 0519 01/31/17 0620 01/31/17 0729 01/31/17 0838 01/31/17 0951  GLUCAP 166* 131* 166* 190* 142*    Recent Results (from the past 240 hour(s))  MRSA PCR Screening     Status: None   Collection Time: 01/30/17 10:32 PM  Result Value Ref Range Status   MRSA by PCR NEGATIVE NEGATIVE Final    Comment:        The GeneXpert MRSA Assay (FDA approved for NASAL specimens only), is one component of a comprehensive MRSA colonization surveillance program. It is not intended to diagnose MRSA infection  nor to guide or monitor treatment for MRSA infections.          Radiology Studies: Dg Chest 2 View  Result Date: 01/30/2017 CLINICAL DATA:  Weakness, shortness of breath, cough EXAM: CHEST  2 VIEW COMPARISON:  12/03/2016 FINDINGS: Heart and mediastinal contours are within normal limits. No focal opacities or effusions. No acute bony abnormality. IMPRESSION: No active cardiopulmonary disease. Electronically Signed   By: Rolm Baptise M.D.   On: 01/30/2017 15:28   Ct Head Wo Contrast  Result Date: 01/30/2017 CLINICAL DATA:  Weakness and fatigue since yesterday. EXAM: CT HEAD WITHOUT CONTRAST TECHNIQUE: Contiguous axial images were obtained from the base of the skull through the vertex without intravenous contrast. COMPARISON:  None. FINDINGS: Brain: The ventricles and cisterns are within normal. There is evidence of age related atrophic change. There is minimal chronic ischemic microvascular disease. Possible tiny old lacunar infarct over the anterior limb right internal capsule. No mass, mass effect, shift midline structures or acute hemorrhage. No evidence of acute infarction. Vascular:  No hyperdense vessel or unexpected calcification. Skull: Within normal. Sinuses/Orbits: Within normal. Other: None. IMPRESSION: No acute intracranial findings. Minimal chronic ischemic microvascular disease and age related atrophic change. Electronically Signed   By: Marin Olp M.D.   On: 01/30/2017 19:28        Scheduled Meds: . aspirin EC  81 mg Oral Daily  . atorvastatin  20 mg Oral Daily  . cholecalciferol  5,000 Units Oral BID  . enoxaparin (LOVENOX) injection  40 mg Subcutaneous Q24H  . fluticasone  2 spray Each Nare Daily  . gabapentin  600 mg Oral TID  . levofloxacin  500 mg Oral Daily  . megestrol  20 mg Oral BID  . metoprolol succinate  12.5 mg Oral Daily  . montelukast  10 mg Oral QHS  . multivitamin with minerals   Oral Daily  . omega-3 acid ethyl esters  1 g Oral Daily  .  pantoprazole  40 mg Oral Daily  . tamsulosin  0.4 mg Oral QHS   Continuous Infusions: . dextrose 5 % and 0.45% NaCl 150 mL/hr at 01/31/17 1032  . insulin (NOVOLIN-R) infusion 3.3 Units/hr (01/31/17 0958)  . sodium chloride Stopped (01/30/17 2200)     LOS: 1 day     Kla Bily, MD, FACP, FHM. [facility] Pager [phone]  If 7PM-7AM, please contact night-coverage [email or web address] Password [email or web address] 01/31/2017, 10:39 AM

## 2017-02-01 DIAGNOSIS — E872 Acidosis: Secondary | ICD-10-CM

## 2017-02-01 LAB — GLUCOSE, CAPILLARY
GLUCOSE-CAPILLARY: 103 mg/dL — AB (ref 65–99)
GLUCOSE-CAPILLARY: 152 mg/dL — AB (ref 65–99)
GLUCOSE-CAPILLARY: 157 mg/dL — AB (ref 65–99)
GLUCOSE-CAPILLARY: 203 mg/dL — AB (ref 65–99)
GLUCOSE-CAPILLARY: 218 mg/dL — AB (ref 65–99)
GLUCOSE-CAPILLARY: 232 mg/dL — AB (ref 65–99)
GLUCOSE-CAPILLARY: 248 mg/dL — AB (ref 65–99)
GLUCOSE-CAPILLARY: 309 mg/dL — AB (ref 65–99)
Glucose-Capillary: 151 mg/dL — ABNORMAL HIGH (ref 65–99)
Glucose-Capillary: 131 mg/dL — ABNORMAL HIGH (ref 65–99)
Glucose-Capillary: 141 mg/dL — ABNORMAL HIGH (ref 65–99)
Glucose-Capillary: 124 mg/dL — ABNORMAL HIGH (ref 65–99)
Glucose-Capillary: 89 mg/dL (ref 65–99)
Glucose-Capillary: 164 mg/dL — ABNORMAL HIGH (ref 65–99)
Glucose-Capillary: 174 mg/dL — ABNORMAL HIGH (ref 65–99)
Glucose-Capillary: 176 mg/dL — ABNORMAL HIGH (ref 65–99)
Glucose-Capillary: 152 mg/dL — ABNORMAL HIGH (ref 65–99)
Glucose-Capillary: 134 mg/dL — ABNORMAL HIGH (ref 65–99)
Glucose-Capillary: 135 mg/dL — ABNORMAL HIGH (ref 65–99)

## 2017-02-01 LAB — BASIC METABOLIC PANEL
ANION GAP: 10 (ref 5–15)
ANION GAP: 7 (ref 5–15)
Anion gap: 10 (ref 5–15)
Anion gap: 10 (ref 5–15)
BUN: 20 mg/dL (ref 6–20)
BUN: 17 mg/dL (ref 6–20)
BUN: 18 mg/dL (ref 6–20)
BUN: 18 mg/dL (ref 6–20)
CALCIUM: 8.9 mg/dL (ref 8.9–10.3)
CHLORIDE: 104 mmol/L (ref 101–111)
CHLORIDE: 104 mmol/L (ref 101–111)
CO2: 16 mmol/L — ABNORMAL LOW (ref 22–32)
CO2: 14 mmol/L — ABNORMAL LOW (ref 22–32)
CO2: 15 mmol/L — AB (ref 22–32)
CO2: 20 mmol/L — ABNORMAL LOW (ref 22–32)
CREATININE: 0.91 mg/dL (ref 0.61–1.24)
CREATININE: 0.82 mg/dL (ref 0.61–1.24)
CREATININE: 0.91 mg/dL (ref 0.61–1.24)
Calcium: 8.7 mg/dL — ABNORMAL LOW (ref 8.9–10.3)
Calcium: 8.8 mg/dL — ABNORMAL LOW (ref 8.9–10.3)
Calcium: 9 mg/dL (ref 8.9–10.3)
Chloride: 105 mmol/L (ref 101–111)
Chloride: 106 mmol/L (ref 101–111)
Creatinine, Ser: 0.96 mg/dL (ref 0.61–1.24)
GFR calc Af Amer: >60 mL/min (ref >60)
GFR calc Af Amer: >60 mL/min (ref >60)
GFR calc Af Amer: >60 mL/min (ref >60)
GFR calc non Af Amer: >60 mL/min (ref >60)
GFR calc non Af Amer: >60 mL/min (ref >60)
GFR calc non Af Amer: >60 mL/min (ref >60)
GLUCOSE: 137 mg/dL — AB (ref 65–99)
GLUCOSE: 145 mg/dL — AB (ref 65–99)
Glucose, Bld: 304 mg/dL — ABNORMAL HIGH (ref 65–99)
Glucose, Bld: 226 mg/dL — ABNORMAL HIGH (ref 65–99)
POTASSIUM: 4.2 mmol/L (ref 3.5–5.1)
POTASSIUM: 3.5 mmol/L (ref 3.5–5.1)
Potassium: 3.8 mmol/L (ref 3.5–5.1)
Potassium: 3.6 mmol/L (ref 3.5–5.1)
SODIUM: 131 mmol/L — AB (ref 135–145)
SODIUM: 130 mmol/L — AB (ref 135–145)
SODIUM: 129 mmol/L — AB (ref 135–145)
SODIUM: 131 mmol/L — AB (ref 135–145)

## 2017-02-01 MED ORDER — SODIUM BICARBONATE 8.4 % IV SOLN
INTRAVENOUS | Status: DC
  Administered 2017-02-01 (×2): via INTRAVENOUS
  Filled 2017-02-01 (×4): qty 850

## 2017-02-01 MED ORDER — LIVING WELL WITH DIABETES BOOK
Freq: Once | Status: AC
  Administered 2017-02-01: 11:00:00
  Filled 2017-02-01: qty 1

## 2017-02-01 MED ORDER — INSULIN STARTER KIT- PEN NEEDLES (ENGLISH)
1.0000 | Freq: Once | Status: AC
  Administered 2017-02-01: 1
  Filled 2017-02-01: qty 1

## 2017-02-01 MED ORDER — INSULIN ASPART 100 UNIT/ML ~~LOC~~ SOLN
0.0000 [IU] | Freq: Every day | SUBCUTANEOUS | Status: DC
  Administered 2017-02-01 – 2017-02-02 (×2): 2 [IU] via SUBCUTANEOUS

## 2017-02-01 MED ORDER — INSULIN ASPART 100 UNIT/ML ~~LOC~~ SOLN
0.0000 [IU] | Freq: Three times a day (TID) | SUBCUTANEOUS | Status: DC
  Administered 2017-02-01: 5 [IU] via SUBCUTANEOUS
  Administered 2017-02-01: 11 [IU] via SUBCUTANEOUS
  Administered 2017-02-02: 5 [IU] via SUBCUTANEOUS
  Administered 2017-02-02 – 2017-02-03 (×3): 3 [IU] via SUBCUTANEOUS
  Administered 2017-02-03: 11 [IU] via SUBCUTANEOUS

## 2017-02-01 MED ORDER — INSULIN GLARGINE 100 UNIT/ML ~~LOC~~ SOLN
24.0000 [IU] | Freq: Every day | SUBCUTANEOUS | Status: DC
  Administered 2017-02-01 – 2017-02-03 (×3): 24 [IU] via SUBCUTANEOUS
  Filled 2017-02-01 (×3): qty 0.24

## 2017-02-01 MED ORDER — INSULIN ASPART 100 UNIT/ML ~~LOC~~ SOLN
4.0000 [IU] | Freq: Three times a day (TID) | SUBCUTANEOUS | Status: DC
  Administered 2017-02-01 – 2017-02-03 (×6): 4 [IU] via SUBCUTANEOUS

## 2017-02-01 NOTE — Progress Notes (Addendum)
Inpatient Diabetes Program Recommendations  AACE/ADA: New Consensus Statement on Inpatient Glycemic Control (2015)  Target Ranges:  Prepandial:   less than 140 mg/dL      Peak postprandial:   less than 180 mg/dL (1-2 hours)      Critically ill patients:  140 - 180 mg/dL  Results for Michael Guzman, Michael Guzman (MRN [id number]) as of 02/01/2017 14:07  Ref. Range 02/01/2017 11:04 02/01/2017 12:04  Glucose-Capillary Latest Ref Range: 65 - 99 mg/dL 218 (H) 309 (H)   Results for Michael Guzman, Michael Guzman (MRN [id number]) as of 02/01/2017 09:14  Ref. Range 02/01/2017 00:32 02/01/2017 01:47 02/01/2017 02:54 02/01/2017 03:57 02/01/2017 05:01 02/01/2017 06:13 02/01/2017 07:20 02/01/2017 08:24  Glucose-Capillary Latest Ref Range: 65 - 99 mg/dL 103 (H) 89 164 (H) 174 (H) 152 (H) 176 (H) 152 (H) 134 (H)   Review of Glycemic Control  Diabetes history: DM2 Outpatient Diabetes medications: Jardiance 25 mg daily, Glipizide 5 mg TID with meals (if CBG >200), Metformin XR 500 mg TID Current orders for Inpatient glycemic control: IV insulin drip (Transitioning to Lantus 24 units daily, Novolog 0-15 units TID with meals, Novolog 0-5 units QHS)  Inpatient Diabetes Program Recommendations: Insulin - Basal: Per discussion with Dr. Algis Liming patient will be transitioned from IV to SQ insulin at this time and will be ordered Lantus 24 units Q24H (based on 78.5 kg x 0.3 units). HgbA1C: A1C 10.5% on 01/15/17 indicating an average glucose of 255 mg/dl over the prior 2-3 months.  Insulin-Meal Coverage: Please consider ordering Novolog 4 units TID with meals for meal coverage.  NOTE: Talked with Dr. Algis Liming regarding transition from IV to SQ insulin. Patient's A1C has increased from 7.4% on 12/03/16 to 10.5% on 01/15/17. Patient may need to take insulin as an outpatient for DM control. Will plan to talk with patient today.  Addendum 02/01/17'@12'$ :00-Spoke with patient about diabetes and home regimen for diabetes control. Patient reports that he is  followed by PCP for diabetes management and currently he takes Jardiance 25 mg daily, Glipizide 5 mg TID with meals (if CBG >200), Metformin XR 500 mg TID as an outpatient for diabetes control. Patient reports that he is taking DM medications as prescribed.  Patient states that he checks his glucose 1-2 times per day and that it is usually in the 200's mg/dl.  Patient states that he recently was prescribed Prednisone on 01/27/17 and his glucose was higher once he started taking the Prednisone. Discussed A1C results (10.5% on 01/15/17) and explained that A1C indicates an average glucose of 255 mg/dl over the past 2-3 months which correlates with reported finger sticks in the 200's mg/dl range.  Discussed glucose and A1C goals. Discussed importance of checking CBGs and maintaining good CBG control to prevent long-term and short-term complications. Explained how hyperglycemia leads to damage within blood vessels which lead to the common complications seen with uncontrolled diabetes. Stressed to the patient the importance of improving glycemic control to prevent further complications from uncontrolled diabetes. Discussed impact of nutrition, exercise, stress, sickness, and medications on diabetes control. Patient reports that he plans to start watching what he is eating closer but he feels he has a good understanding of Carb Modified diet. Patient has Living Well with Diabetes booklet at bedside and was encouraged to read the entire book. Patient states he does not have his reading glasses here at the hospital and his wife is going to bring them to his this afternoon. Inquired about any insulin use in past and patient states that  he has never been on insulin and that he prefers not to use insulin as an outpatient. Explained to patient that he will most likely need insulin to improve DM control and decrease future risk of developing further complications from uncontrolled DM. Patient has been given insulin starter kit  which at his bedside. Encouraged patient to review kit and Diabetes Coordinator will come back tomorrow to discuss insulin in more detail.    Patient verbalized understanding of information discussed and he states that he has no further questions at this time related to diabetes.  NURSING: Please use each patient interaction to provide diabetes education. Patient will likely discharge new to insulin. Please review Living Well with Diabetes booklet with the patient, have patient watch patient education videos on diabetes, and instruct on insulin administration. Please allow patient to be actively engaged with diabetes management by allowing patient to check own glucose and self-administer insulin injections.   Thanks, Barnie Alderman, RN, MSN, CDE Diabetes Coordinator Inpatient Diabetes Program [phone] (Team Pager from 8am to 5pm)

## 2017-02-01 NOTE — Progress Notes (Signed)
PROGRESS NOTE   Michael Guzman  DEY:[id number]    DOB: [birth year]    DOA: 01/30/2017  PCP: Michael Pinto, MD   I have briefly reviewed patients previous medical records in [facility].  Brief Narrative:  71 year old male with PMH of CAD, type II DM, HTN, GERD, prostate cancer (s/p XRT. Follows with Michael Guzman, Urology & Michael Guzman, radiation oncologist), stage II chronic kidney disease, recently treated by PCP with oral steroids and levofloxacin for acute sinusitis, resented with generalized weakness, polydipsia, polyuria and found to have DKA (blood sugar 444, anion gap 21, potassium 5.3 and bicarbonate 7). Admitted to stepdown unit. Treated per DKA protocol. Glycemic control improved. AG normalized. However persistent NAG metabolic acidosis.? RTA type IV. Transitioned to Lantus, NovoLog SSI and started bicarbonate drip 5/21. Follow BMP closely.    Assessment & Plan:   Principal Problem:   DKA (diabetic ketoacidoses) ([place]) Active Problems:   Hypertension (1991)   T2_NIDDM w/ CKD2 (GFR 64 ml/min)   GERD   ASCAD s/p PTCA (06/2012)   Mixed hyperlipidemia   Malignant neoplasm of prostate (HCC)   Acute sinusitis   Elevated lactic acid level   Hyperkalemia   Acute renal failure superimposed on stage 2 chronic kidney disease ([place])   1. DKA and type II DM, not at goal: Noncompliant with CBG monitoring or diet. Claims compliance with oral hypoglycemics. Last A1c on 01/15/17:10.5 suggests very poor control. Current worsening hyperglycemia likely due to steroids initiated for acute sinusitis. Presented with blood sugar of 444, anion gap 21, potassium 5.3, bicarbonate 7. Admitted to stepdown unit. Treated per DKA protocol. Glycemic control improved. AG normalized. However persistent NAG metabolic acidosis despite IV fluids and management for approximately 36 hours.? RTA type IV. Transitioned to Lantus, NovoLog SSI and started bicarbonate drip 5/21. Follow BMP closely. Discontinue  prednisoned. Discussed at length with patient and spouse on 5/20, that he will likely need insulin at discharge at least short-term until his glycemic control improves. They verbalized understanding. Holding all oral hypoglycemics (metformin, glipizide and Jardiance). Discussed with diabetes coordinator and their input appreciated. Now started on Lantus 24 units daily and NovoLog moderate SSI. Diet started. 2. NAG metabolic acidosis/? RTA 4: Management as above. Changed to bicarbonate drip. Follow BMP every 6 hours. 3. Dehydration with hyponatremia: Hyponatremia secondary to dehydration and severe hyperglycemia. Hyponatremia stable. Dehydration has clinically improved. 4. Acute kidney injury complicating stage II chronic kidney disease: Secondary to dehydration. Resolved. Holding enalapril. 5. Mild hyperkalemia: Secondary to acute kidney injury in DKA. Resolved. 6. Essential hypertension: Controlled. Continue metoprolol. Enalapril held due to recent AKI and hyperkalemia 7. Hyperlipidemia: Statins. 8. GERD: Continue PPI. 9. Prostate cancer (s/p XRT) & BPH: Continue tamsulosin. Follows outpatient with urology and radiation oncology. 10. CAD: Asymptomatic of chest pain. Continue aspirin, beta blockers and statins. 11. Generalized weakness: Secondary to problem #1. CT head without acute findings. PT has evaluated and recommended home health PT. 12. Acute sinusitis: Continue levofloxacin. Steroids discontinued. Blood cultures 2: Negative to date.   DVT prophylaxis: Lovenox Code Status: Full Family Communication: None at bedside today. Disposition: DC home when medically improved and stable. Not medically ready for discharge due to significant metabolic abnormalities. DC home when improved, possibly 5/22.   Consultants:  None   Procedures:  None  Antimicrobials:  None    Subjective: Seen this morning. Feels stronger. Urinary frequency improved. Hungry and wants to eat.  ROS:  denies  cough, sinus congestion, headache, dyspnea or chest pain.  Objective:  Vitals:   02/01/17 0500 02/01/17 0600 02/01/17 0700 02/01/17 0826  BP: 96/70 106/83 116/80 109/80  Pulse: 71 69 70   Resp: 12 13 12    Temp:    97.9 F (36.6 C)  TempSrc:    Axillary  SpO2: 100% 99% 100%   Weight:      Height:        Examination:  General exam: Moderately built and nourished pleasant middle-aged male lying comfortably supine in bed. Oral mucosa with borderline hydration. Skin turgor normal. Respiratory system: Clear to auscultation without wheezing, rhonchi or crackles. No increased work of breathing. Cardiovascular system: S1 and S2 heard, RRR. No JVD, murmurs or pedal edema. Telemetry: SR.  Gastrointestinal system: Abdomen is nondistended, soft and nontender. Normal bowel sounds heard.  Central nervous system:Alert, oriented without focal neurological deficits.  Extremities: Symmetric 5 x 5 power.Moves all limbs well.  Skin: No rashes, lesions or ulcers Psychiatry: Judgement and insight appear normal. Mood & affect appears flat.   Data Reviewed: I have personally reviewed following labs and imaging studies  CBC:  Recent Labs Lab 01/27/17 1729 01/30/17 1454  WBC 5.2 10.3  NEUTROABS 3,536 9.1*  HGB 14.7 14.7  HCT 45.0 43.2  MCV 94.1 90.6  PLT 204 299   Basic Metabolic Panel:  Recent Labs Lab 01/30/17 1805  01/31/17 1429 01/31/17 1839 01/31/17 2236 02/01/17 0306 02/01/17 0725  NA  --   < > 130* 132* 132* 131* 130*  K  --   < > 3.8 3.6 3.2* 3.8 3.6  CL  --   < > 105 107 106 105 106  CO2  --   < > 16* 17* 18* 16* 14*  GLUCOSE  --   < > 174* 130* 151* 137* 145*  BUN  --   < > 24* 22* 20 20 17   CREATININE  --   < > 0.93 0.95 0.94 0.91 0.82  CALCIUM  --   < > 8.9 9.1 9.2 8.9 8.7*  MG 2.9*  --   --   --   --   --   --   PHOS 4.6  --   --   --   --   --   --   < > = values in this interval not displayed. Liver Function Tests:  Recent Labs Lab 01/30/17 1454  AST 15  ALT  16*  ALKPHOS 72  BILITOT 1.7*  PROT 7.7  ALBUMIN 4.2   Cardiac Enzymes:  Recent Labs Lab 01/27/17 1729  CKTOTAL 51   CBG:  Recent Labs Lab 02/01/17 0501 02/01/17 0613 02/01/17 0720 02/01/17 0824 02/01/17 0948  GLUCAP 152* 176* 152* 134* 135*    Recent Results (from the past 240 hour(s))  Culture, blood (routine x 2)     Status: None (Preliminary result)   Collection Time: 01/30/17  8:20 PM  Result Value Ref Range Status   Specimen Description BLOOD RIGHT HAND  Final   Special Requests IN PEDIATRIC BOTTLE Blood Culture adequate volume  Final   Culture NO GROWTH 2 DAYS  Final   Report Status PENDING  Incomplete  Culture, blood (routine x 2)     Status: None (Preliminary result)   Collection Time: 01/30/17  8:45 PM  Result Value Ref Range Status   Specimen Description BLOOD LEFT HAND  Final   Special Requests IN PEDIATRIC BOTTLE Blood Culture adequate volume  Final   Culture NO GROWTH 2 DAYS  Final   Report Status  PENDING  Incomplete  MRSA PCR Screening     Status: None   Collection Time: 01/30/17 10:32 PM  Result Value Ref Range Status   MRSA by PCR NEGATIVE NEGATIVE Final    Comment:        The GeneXpert MRSA Assay (FDA approved for NASAL specimens only), is one component of a comprehensive MRSA colonization surveillance program. It is not intended to diagnose MRSA infection nor to guide or monitor treatment for MRSA infections.          Radiology Studies: Dg Chest 2 View  Result Date: 01/30/2017 CLINICAL DATA:  Weakness, shortness of breath, cough EXAM: CHEST  2 VIEW COMPARISON:  12/03/2016 FINDINGS: Heart and mediastinal contours are within normal limits. No focal opacities or effusions. No acute bony abnormality. IMPRESSION: No active cardiopulmonary disease. Electronically Signed   By: Rolm Baptise M.D.   On: 01/30/2017 15:28   Ct Head Wo Contrast  Result Date: 01/30/2017 CLINICAL DATA:  Weakness and fatigue since yesterday. EXAM: CT HEAD WITHOUT  CONTRAST TECHNIQUE: Contiguous axial images were obtained from the base of the skull through the vertex without intravenous contrast. COMPARISON:  None. FINDINGS: Brain: The ventricles and cisterns are within normal. There is evidence of age related atrophic change. There is minimal chronic ischemic microvascular disease. Possible tiny old lacunar infarct over the anterior limb right internal capsule. No mass, mass effect, shift midline structures or acute hemorrhage. No evidence of acute infarction. Vascular: No hyperdense vessel or unexpected calcification. Skull: Within normal. Sinuses/Orbits: Within normal. Other: None. IMPRESSION: No acute intracranial findings. Minimal chronic ischemic microvascular disease and age related atrophic change. Electronically Signed   By: Marin Olp M.D.   On: 01/30/2017 19:28        Scheduled Meds: . aspirin EC  81 mg Oral Daily  . atorvastatin  20 mg Oral Daily  . cholecalciferol  5,000 Units Oral BID  . enoxaparin (LOVENOX) injection  40 mg Subcutaneous Q24H  . fluticasone  2 spray Each Nare Daily  . gabapentin  600 mg Oral TID  . insulin aspart  0-15 Units Subcutaneous TID WC  . insulin aspart  0-5 Units Subcutaneous QHS  . insulin glargine  24 Units Subcutaneous Daily  . insulin starter kit- pen needles  1 kit Other Once  . levofloxacin  500 mg Oral Daily  . living well with diabetes book   Does not apply Once  . metoprolol succinate  12.5 mg Oral Daily  . montelukast  10 mg Oral QHS  . multivitamin with minerals   Oral Daily  . omega-3 acid ethyl esters  1 g Oral Daily  . pantoprazole  40 mg Oral Daily  . tamsulosin  0.4 mg Oral QHS   Continuous Infusions: . insulin (NOVOLIN-R) infusion 1.5 Units/hr (02/01/17 1002)  .  sodium bicarbonate (isotonic) infusion in sterile water 75 mL/hr at 02/01/17 1034     LOS: 2 days     Tamilyn Lupien, MD, FACP, FHM. [facility] Pager [phone]  If 7PM-7AM, please contact  night-coverage [email or web address] Password [facility] 02/01/2017, 10:36 AM

## 2017-02-01 NOTE — Evaluation (Signed)
Physical Therapy Evaluation Patient Details Name: Michael Guzman MRN: [id number] DOB: 01-05-1946 Today's Date: 02/01/2017   History of Present Illness  71 year old male with PMH of CAD, type II DM, HTN, GERD, prostate cancer s/p XRT, stage II chronic kidney disease, recently treated by PCP with oral steroids and levofloxacin for acute sinusitis, presented with generalized weakness, polydipsia, polyuria and found to have DKA (blood sugar 444, anion gap 21, potassium 5.3 and bicarbonate 7).   Clinical Impression  Patient presents with decreased mobility due to deficits listed in PT problem list.  Feel he is generally deconditioned due to illness and bedrest.  Feel he can be easily mobilized with nursing assist and though currently seems in need of HHPT may progress if mobilized more frequently in acute setting.  Will follow up if not d/c.    Follow Up Recommendations Home health PT    Equipment Recommendations  None recommended by PT    Recommendations for Other Services       Precautions / Restrictions Precautions Precautions: Fall      Mobility  Bed Mobility Overal bed mobility: Modified Independent                Transfers Overall transfer level: Needs assistance Equipment used: None Transfers: Sit to/from Stand Sit to Stand: Supervision         General transfer comment: for safety with IV, monitor  Ambulation/Gait Ambulation/Gait assistance: Supervision;Min guard Ambulation Distance (Feet): 175 Feet Assistive device: None Gait Pattern/deviations: Step-through pattern;Decreased stride length;Shuffle     General Gait Details: noted knees weak and mildly buckling with ambulation, pt relates feeling somwhat weak overall, but no LOB  Stairs            Wheelchair Mobility    Modified Rankin (Stroke Patients Only)       Balance Overall balance assessment: Needs assistance   Sitting balance-Leahy Scale: Good     Standing balance support: No  upper extremity supported Standing balance-Leahy Scale: Fair                               Pertinent Vitals/Pain Pain Assessment: No/denies pain    Home Living Family/patient expects to be discharged to:: Private residence Living Arrangements: Spouse/significant other Available Help at Discharge: Family Type of Home: House Home Access: Stairs to enter Entrance Stairs-Rails: Psychiatric nurse of Steps: 4 Home Layout: One level Home Equipment: Cane - single point      Prior Function Level of Independence: Independent         Comments: performs most of the IADL's due to wife going to HD     Hand Dominance   Dominant Hand: Right    Extremity/Trunk Assessment   Upper Extremity Assessment Upper Extremity Assessment: Overall WFL for tasks assessed    Lower Extremity Assessment Lower Extremity Assessment: Generalized weakness       Communication   Communication: No difficulties  Cognition Arousal/Alertness: Awake/alert Behavior During Therapy: WFL for tasks assessed/performed Overall Cognitive Status: Within Functional Limits for tasks assessed                                        General Comments      Exercises     Assessment/Plan    PT Assessment Patient needs continued PT services  PT Problem List Decreased strength;Decreased balance;Decreased knowledge of  use of DME;Decreased mobility;Decreased safety awareness       PT Treatment Interventions DME instruction;Gait training;Stair training;Balance training;Functional mobility training;Patient/family education;Therapeutic activities    PT Goals (Current goals can be found in the Care Plan section)  Acute Rehab PT Goals Patient Stated Goal: To return to independent PT Goal Formulation: With patient Time For Goal Achievement: 02/05/17 Potential to Achieve Goals: Good    Frequency Min 3X/week   Barriers to discharge        Co-evaluation                AM-PAC PT "6 Clicks" Daily Activity  Outcome Measure Difficulty turning over in bed (including adjusting bedclothes, sheets and blankets)?: None Difficulty moving from lying on back to sitting on the side of the bed? : None Difficulty sitting down on and standing up from a chair with arms (e.g., wheelchair, bedside commode, etc,.)?: A Little Help needed moving to and from a bed to chair (including a wheelchair)?: A Little Help needed walking in hospital room?: A Little Help needed climbing 3-5 steps with a railing? : A Little 6 Click Score: 20    End of Session Equipment Utilized During Treatment: Gait belt Activity Tolerance: Patient tolerated treatment well Patient left: in chair;with call bell/phone within reach Nurse Communication: Mobility status PT Visit Diagnosis: Muscle weakness (generalized) (M62.81);Other abnormalities of gait and mobility (R26.89)    Time: 0942-1000 PT Time Calculation (min) (ACUTE ONLY): 18 min   Charges:   PT Evaluation $PT Eval Moderate Complexity: 1 Procedure     PT G CodesMagda Kiel, [place] 02/01/2017   [name] 02/01/2017, 10:23 AM

## 2017-02-01 NOTE — Plan of Care (Signed)
Problem: Education: Goal: Knowledge of the prescribed therapeutic regimen will improve Outcome: Completed/Met Date Met: 02/01/17 Patient educated on continuance of stabilizer at this time.  Goal: Ability to describe self-care measures that may prevent or decrease complications (Diabetes Survival Skills Education) will improve Outcome: Completed/Met Date Met: 02/01/17 Discussed likelihood of relationship between DKA and medication/illness.   Problem: Metabolic: Goal: Ability to maintain appropriate glucose levels will improve Outcome: Progressing Patient CBG's much improved. 90-200 on night shift.  Goal: Diagnostic test results will improve Outcome: Progressing Anion gap closed, awaiting CO2 regulation. Remaining on stabilizer.   Problem: Respiratory: Goal: Peripheral tissue perfusion will improve Outcome: Completed/Met Date Met: 02/01/17 Patient receiving lovenox for VTE.  Problem: Urinary Elimination: Goal: Ability to achieve and maintain adequate renal perfusion and functioning will improve Outcome: Progressing Patient able to use urinal at side of bed.

## 2017-02-01 NOTE — Progress Notes (Addendum)
Nutrition Brief Note  Pt identified on the Malnutrition Screening Tool (MST) Report.    Has had a 5% weight loss in the last 2.5 weeks which is significant; likely due to DKA.  Wt Readings from Last 15 Encounters:  01/30/17 173 lb 1 oz (78.5 kg)  01/15/17 182 lb 9.6 oz (82.8 kg)  01/01/17 183 lb 6.4 oz (83.2 kg)  12/03/16 184 lb (83.5 kg)  12/02/16 187 lb 6.4 oz (85 kg)  11/09/16 179 lb 12.8 oz (81.6 kg)  10/16/16 192 lb 12.8 oz (87.5 kg)  10/08/16 188 lb 3.2 oz (85.4 kg)  10/07/16 188 lb 6.4 oz (85.5 kg)  09/25/16 188 lb (85.3 kg)  09/18/16 192 lb (87.1 kg)  09/11/16 193 lb 3.2 oz (87.6 kg)  09/04/16 189 lb 9.6 oz (86 kg)  08/26/16 192 lb 3.2 oz (87.2 kg)  06/23/16 188 lb (85.3 kg)    Body mass index is 25.56 kg/m. Patient meets criteria for Overweight based on current BMI.  Nutrition focused physical exam completed.  No muscle or subcutaneous fat depletion noticed.   Current diet order is Carbohydrate Modified, patient is consuming approximately 100% of meals at this time.   Pt stated he just finished having breakfast.  Labs and medications reviewed.  CBG's [id number].  No nutrition interventions warranted at this time. If nutrition issues arise, please consult RD.   Arthur Holms, RD, LDN Pager #: [phone] After-Hours Pager #: [phone]

## 2017-02-01 NOTE — Consult Note (Signed)
   [facility] CM Inpatient Consult   02/01/2017  Michael Guzman 11-Oct-1945 [id number]  Referral received for [facility] Care Management for Diabetes follow up. Patient has [employer]. Chart review reveals the patient is a 71 year old male with a medication HX of Type II Diabetes and admitted with DKA.  Met with the patient at the bedside.  Patient denies any difficulties with obtaining medications, educational needs, or community resource needs. Patient endorses Dr.William Melford Aase as his primary care provider.  Explained to the patient about the referral and he states, "right now I don't think I need it."  Patient did accept a brochure and contact information.  Inpatient RNCM Neoma Laming was notified of patient declined [facility] for post hospital follow up.  Patient did state he would call if he thinks his medications would cost too much.    For questions, please contact:  Natividad Brood, RN BSN [facility] Liaison  [phone] business mobile phone Toll free office [phone]

## 2017-02-02 DIAGNOSIS — E1010 Type 1 diabetes mellitus with ketoacidosis without coma: Secondary | ICD-10-CM

## 2017-02-02 LAB — GLUCOSE, CAPILLARY
GLUCOSE-CAPILLARY: 240 mg/dL — AB (ref 65–99)
GLUCOSE-CAPILLARY: 213 mg/dL — AB (ref 65–99)
Glucose-Capillary: 162 mg/dL — ABNORMAL HIGH (ref 65–99)
Glucose-Capillary: 181 mg/dL — ABNORMAL HIGH (ref 65–99)
Glucose-Capillary: 164 mg/dL — ABNORMAL HIGH (ref 65–99)

## 2017-02-02 LAB — BASIC METABOLIC PANEL
Anion gap: 10 (ref 5–15)
Anion gap: 9 (ref 5–15)
BUN: 17 mg/dL (ref 6–20)
BUN: 13 mg/dL (ref 6–20)
CHLORIDE: 102 mmol/L (ref 101–111)
CHLORIDE: 102 mmol/L (ref 101–111)
CO2: 21 mmol/L — ABNORMAL LOW (ref 22–32)
CO2: 21 mmol/L — ABNORMAL LOW (ref 22–32)
CREATININE: 0.85 mg/dL (ref 0.61–1.24)
Calcium: 8.8 mg/dL — ABNORMAL LOW (ref 8.9–10.3)
Calcium: 8.9 mg/dL (ref 8.9–10.3)
Creatinine, Ser: 0.96 mg/dL (ref 0.61–1.24)
GFR calc Af Amer: >60 mL/min (ref >60)
GFR calc non Af Amer: >60 mL/min (ref >60)
GFR calc non Af Amer: >60 mL/min (ref >60)
Glucose, Bld: 213 mg/dL — ABNORMAL HIGH (ref 65–99)
Glucose, Bld: 264 mg/dL — ABNORMAL HIGH (ref 65–99)
POTASSIUM: 3.3 mmol/L — AB (ref 3.5–5.1)
POTASSIUM: 3.5 mmol/L (ref 3.5–5.1)
SODIUM: 133 mmol/L — AB (ref 135–145)
Sodium: 132 mmol/L — ABNORMAL LOW (ref 135–145)

## 2017-02-02 MED ORDER — POTASSIUM CHLORIDE CRYS ER 20 MEQ PO TBCR
40.0000 meq | EXTENDED_RELEASE_TABLET | Freq: Once | ORAL | Status: AC
  Administered 2017-02-02: 40 meq via ORAL
  Filled 2017-02-02: qty 2

## 2017-02-02 NOTE — Progress Notes (Signed)
PROGRESS NOTE   Michael Guzman  ZOX:[id number]    DOB: [birth year]    DOA: 01/30/2017  PCP: [name], MD   I have extensively reviewed patients previous medical records in [facility].  Brief Narrative:  64   CAD s/p DES LAD Prior epsitaxis [facility] Rheum Arthritis affecting mainly UE  type II DM,  HTN,  Thyropiditis with Low TSH foll Dr. Cruzita Lederer GERD,  prostate cancer stage T1c  (s/p XRT 12/11-->10/21/16-alos on Lupron Follows with Dr. Junious Silk, Urology & Dr. Tammi Klippel, radiation oncologist),  stage II chronic kidney disease, recently treated by PCP with oral steroids and levofloxacin for acute sinusitis,  presented  5/19 with generalized weakness, polydipsia, polyuria and found to have DKA (blood sugar 444, anion gap 21, potassium 5.3 and bicarbonate 7).  Admitted to stepdown unit. Treated per DKA protocol. Glycemic control improved. AG normalized.   persistent NAG metabolic acidosis.? RTA type IV.  Transitioned to Lantus, NovoLog SSI and started bicarbonate drip 5/21. Follow BMP closely.    Assessment & Plan:   Principal Problem:   DKA (diabetic ketoacidoses) ([place]) Active Problems:   Hypertension (1991)   T2_NIDDM w/ CKD2 (GFR 64 ml/min)   GERD   ASCAD s/p PTCA (06/2012)   Mixed hyperlipidemia   Malignant neoplasm of prostate (HCC)   Acute sinusitis   Elevated lactic acid level   Hyperkalemia   Acute renal failure superimposed on stage 2 chronic kidney disease ([place])   DKA and type II DM, not at goal:On admit blood sugar of 444, anion gap 21, potassium 5.3, bicarbonate 7. Admitted to stepdown unit. Noncompliant with CBG monitoring or diet. Claims compliance with oral hypoglycemics. Last A1c on 01/15/17:10.5 suggests very poor control.  hyperglycemia likely due to steroids initiated for acute sinusitis.  Treated per DKA protocol. Glycemic control improved. AG normalized.  Transitioned to Lantus, NovoLog SSI Follow BMP closely. Discontinue prednisone. Discussed at  length with patient and spouse on 5/20, that he will likely need insulin-Patient has used insulin in the past Holding all oral hypoglycemics (metformin, glipizide and Jardiance). Discussed with diabetes coordinator and their input appreciated. Now started on Lantus 24 units daily and NovoLog moderate SSI. Diet started. NAG metabolic acidosis/? RTA 4: persistent NAG metabolic acidosis despite IV fluids and management for approximately 36 hours.? RTA type IVand started bicarbonate drip 5/21---d/c Gtt 5/22-rpt labs in am Dehydration with osmotic hyponatremia 2/2 to hypeglycemia: Hyponatremia 125 on admit, now stable. Dehydration has clinically improved. Acute kidney injury complicating stage II chronic kidney disease: on admit 39/1.6 and now improved Secondary to dehydration. Resolved. Holding enalapril. Mild hyperkalemia 5.3 on admit: Secondary to acute kidney injury in DKA. Resolved. Potassium actually is now 3.2 so replacing K Dur 40 mEq 02/02/17 Essential hypertension: Controlled. Continue metoprolol. Enalapril held due to recent AKI and hyperkalemia Hyperlipidemia: Statins.  Rpt cmet in am GERD: Continue PPI. Thyroiditis h/o with LOw TSH-OP Endocrine follow-up Prostate cancer (s/p XRT) & BPH on Lupron: Continue tamsulosin. Follows outpatient with urology and radiation oncology. CAD: Asymptomatic of chest pain. Continue aspirin, beta blockers and statins. Generalized weakness: Secondary to problem #1. CT head without acute findings. PT has evaluated and recommended home health PT. Acute sinusitis: Continue levofloxacin 500.  Stop date 5/23. Steroids discontinued. Blood cultures 2: Negative to date.   DVT prophylaxis: Lovenox Code Status: Full Family Communication: None at bedside today. Disposition:  DC home  possibly 5/23, can transfer to a regular bed 5/22    Consultants:  None  Procedures:  None  Antimicrobials:  None    Subjective: .  Alert oriented pleasant no new  issues Eating and drinking fairly well Tells me has used insulin in the past couple of years ago but has not used it since and needs a refresher No chest pain No nausea No vomiting No blurred Or double vision  Objective:  Vitals:   02/01/17 2001 02/01/17 2200 02/01/17 2339 02/02/17 0355  BP: 114/77 113/78 98/73   Pulse: 75 73 77 77  Resp: 15 18    Temp: 98.7 F (37.1 C)  98.2 F (36.8 C) 98.8 F (37.1 C)  TempSrc: Axillary  Oral Axillary  SpO2:  100%  98%  Weight:      Height:        Examination:  General exam:  Alert pleasant looking about stated age compared Neck soft supple no JVD no bruit S1-S2 no murmur rub or gallop Chest clinically clear Abdomen is soft nontender distended no rebound no guarding no lower extremity edema No rash   Data Reviewed: I have personally reviewed following labs and imaging studies  CBC:  Recent Labs Lab 01/27/17 1729 01/30/17 1454  WBC 5.2 10.3  NEUTROABS 3,536 9.1*  HGB 14.7 14.7  HCT 45.0 43.2  MCV 94.1 90.6  PLT 204 914   Basic Metabolic Panel:  Recent Labs Lab 01/30/17 1805  02/01/17 0306 02/01/17 0725 02/01/17 1409 02/01/17 2040 02/02/17 0304  NA  --   < > 131* 130* 129* 131* 133*  K  --   < > 3.8 3.6 4.2 3.5 3.3*  CL  --   < > 105 106 104 104 102  CO2  --   < > 16* 14* 15* 20* 21*  GLUCOSE  --   < > 137* 145* 304* 226* 213*  BUN  --   < > 20 17 18 18 17   CREATININE  --   < > 0.91 0.82 0.96 0.91 0.96  CALCIUM  --   < > 8.9 8.7* 8.8* 9.0 8.8*  MG 2.9*  --   --   --   --   --   --   PHOS 4.6  --   --   --   --   --   --   < > = values in this interval not displayed. Liver Function Tests:  Recent Labs Lab 01/30/17 1454  AST 15  ALT 16*  ALKPHOS 72  BILITOT 1.7*  PROT 7.7  ALBUMIN 4.2   Cardiac Enzymes:  Recent Labs Lab 01/27/17 1729  CKTOTAL 51   CBG:  Recent Labs Lab 02/01/17 0948 02/01/17 1104 02/01/17 1204 02/01/17 1653 02/01/17 2147  GLUCAP 135* 218* 309* 232* 248*    Recent  Results (from the past 240 hour(s))  Culture, blood (routine x 2)     Status: None (Preliminary result)   Collection Time: 01/30/17  8:20 PM  Result Value Ref Range Status   Specimen Description BLOOD RIGHT HAND  Final   Special Requests IN PEDIATRIC BOTTLE Blood Culture adequate volume  Final   Culture NO GROWTH 2 DAYS  Final   Report Status PENDING  Incomplete  Culture, blood (routine x 2)     Status: None (Preliminary result)   Collection Time: 01/30/17  8:45 PM  Result Value Ref Range Status   Specimen Description BLOOD LEFT HAND  Final   Special Requests IN PEDIATRIC BOTTLE Blood Culture adequate volume  Final   Culture NO GROWTH 2 DAYS  Final   Report Status PENDING  Incomplete  MRSA PCR Screening     Status: None   Collection Time: 01/30/17 10:32 PM  Result Value Ref Range Status   MRSA by PCR NEGATIVE NEGATIVE Final    Comment:        The GeneXpert MRSA Assay (FDA approved for NASAL specimens only), is one component of a comprehensive MRSA colonization surveillance program. It is not intended to diagnose MRSA infection nor to guide or monitor treatment for MRSA infections.      Radiology Studies: No results found.   Scheduled Meds: . aspirin EC  81 mg Oral Daily  . atorvastatin  20 mg Oral Daily  . cholecalciferol  5,000 Units Oral BID  . enoxaparin (LOVENOX) injection  40 mg Subcutaneous Q24H  . fluticasone  2 spray Each Nare Daily  . gabapentin  600 mg Oral TID  . insulin aspart  0-15 Units Subcutaneous TID WC  . insulin aspart  0-5 Units Subcutaneous QHS  . insulin aspart  4 Units Subcutaneous TID WC  . insulin glargine  24 Units Subcutaneous Daily  . levofloxacin  500 mg Oral Daily  . metoprolol succinate  12.5 mg Oral Daily  . montelukast  10 mg Oral QHS  . multivitamin with minerals   Oral Daily  . omega-3 acid ethyl esters  1 g Oral Daily  . pantoprazole  40 mg Oral Daily  . tamsulosin  0.4 mg Oral QHS   Continuous Infusions: .  sodium  bicarbonate (isotonic) infusion in sterile water 75 mL/hr at 02/01/17 2148     LOS: 3 days    Verneita Griffes, MD [facility] (P) [phone]   If 7PM-7AM, please contact night-coverage [email or web address] Password [facility] 02/02/2017, 7:34 AM

## 2017-02-02 NOTE — Progress Notes (Signed)
Pt arrived to [address] [place], pt alert and oriented x 4 pt. and family oriented to room. BP 114/76 (BP Location: Right Arm)   Pulse 87   Temp 99 F (37.2 C) (Oral)   Resp 18   Ht 5\' 10"  (1.778 m)   Wt 83.2 kg (183 lb 6.4 oz)   SpO2 98%   BMI 26.32 kg/m

## 2017-02-02 NOTE — Progress Notes (Addendum)
Inpatient Diabetes Program Recommendations  AACE/ADA: New Consensus Statement on Inpatient Glycemic Control (2015)  Target Ranges:  Prepandial:   less than 140 mg/dL      Peak postprandial:   less than 180 mg/dL (1-2 hours)      Critically ill patients:  140 - 180 mg/dL   Results for CHADRIC, KIMBERLEY (MRN [id number]) as of 02/02/2017 08:04  Ref. Range 02/01/2017 06:13 02/01/2017 07:20 02/01/2017 08:24 02/01/2017 09:48 02/01/2017 11:04 02/01/2017 12:04 02/01/2017 16:53 02/01/2017 21:47  Glucose-Capillary Latest Ref Range: 65 - 99 mg/dL 176 (H) 152 (H) 134 (H) 135 (H) 218 (H) 309 (H) 232 (H) 248 (H)   Review of Glycemic Control  Diabetes history: DM2 Outpatient Diabetes medications: Jardiance 25 mg daily, Glipizide 5 mg TID with meals (if CBG >200), Metformin XR 500 mg TID Current orders for Inpatient glycemic control: Lantus 24 units daily, Novolog 4 units TID with meals, Novolog 0-15 units TID with meals, Novolog 0-5 units QHS  Inpatient Diabetes Program Recommendations: Insulin - Basal: Please consider increasing Lantus to 30 units daily (starting today). Insulin - Meal Coverage: Please consider increasing meal coverage to Novolog 6 units TID with meals. HgbA1C: A1C 10.5% on 01/15/17 indicating an average glucose of 255 mg/dl over the prior 2-3 months.   Addendum 02/02/17@12 :36-Spoke with patient and his wife about new diabetes diagnosis.  Discussed A1C results (10.5% on 01/15/17) and reviewed what an A1C is. Discussed importance of checking CBGs and maintaining good CBG control to prevent long-term and short-term complications. Reviewed glucose and A1C goals and explained that patient will likely need to be discharged on insulin. Patient states that he would prefer not to be on insulin but he states he is willing to take insulin if MD feels he needs it and prefers an insulin pen.  Patient's wife reports that she was using 70/30 insulin pens for DM control but does not currently need insulin so she is  familiar with using an insulin pen. Discussed Lantus and Novolog insulin and explained how they are currently ordered.  Reviewed signs and symptoms of hyperglycemia and hypoglycemia along with treatment for both. Encouraged patient to get glucose tablets to keep with him at all times as he reports that he is very active outdoors usually.  Discussed impact of nutrition, exercise, stress, sickness, and medications on diabetes control. Educated patient on insulin pen use at home. Reviewed contents of insulin flexpen starter kit. Reviewed all steps of insulin pen including attachment of needle, 2-unit air shot, dialing up dose, giving injection, removing needle, disposal of sharps, storage of unused insulin, disposal of insulin etc. Patient able to provide successful return demonstration.  Asked patient to check his glucose 4 times per day (before meals and at bedtime) and to keep a log book of glucose readings and insulin taken. Explained how the doctor he follows up with can use the log book to continue to make insulin adjustments if needed.  Patient verbalized understanding of information discussed and he states that he has no further questions at this time related to diabetes.   RNs to provide ongoing basic DM education at bedside with this patient and engage patient to actively check blood glucose and administer insulin injections.   MD to give patient Rxs for insulin pens and insulin pen needles.  Thanks, Barnie Alderman, RN, MSN, CDE Diabetes Coordinator Inpatient Diabetes Program [phone] (Team Pager from 8am to 5pm)

## 2017-02-02 NOTE — Care Management Note (Signed)
Case Management Note  Patient Details  Name: Michael Guzman MRN: [id number] Date of Birth: 1945/10/18  Subjective/Objective:      From home  Hx of CAD, type II DM, HTN, GERD, prostate cancer (s/p XRT. Follows with Dr. Junious Silk, Urology & Dr. Tammi Klippel, radiation oncologist), stage II chronic kidney disease, recently treated by PCP with oral steroids and levofloxacin for acute sinusitis , presents with DKA. Has [employer].  Per pt eval, no pt f/u needed.   PCP listed as [name]            Action/Plan: NCM will follow for dc needs.   Expected Discharge Date:                  Expected Discharge Plan:     In-House Referral:     Discharge planning Services  CM Consult  Post Acute Care Choice:    Choice offered to:     DME Arranged:    DME Agency:     HH Arranged:    HH Agency:     Status of Service:  In process, will continue to follow  If discussed at Long Length of Stay Meetings, dates discussed:    Additional Comments:  Michael Mayo, RN 02/02/2017, 5:18 PM

## 2017-02-02 NOTE — Progress Notes (Signed)
Physical Therapy Treatment Patient Details Name: Michael Guzman MRN: [id number] DOB: Dec 08, 1945 Today's Date: 02/02/2017    History of Present Illness 71 year old male with PMH of CAD, type II DM, HTN, GERD, prostate cancer s/p XRT, stage II chronic kidney disease, recently treated by PCP with oral steroids and levofloxacin for acute sinusitis, presented with generalized weakness, polydipsia, polyuria and found to have DKA (blood sugar 444, anion gap 21, potassium 5.3 and bicarbonate 7).     PT Comments    Patient progressing with ambulation this session.  LE strength better without signs of buckling, though some imbalance on turns needing minguard A for safety.  Also able to negotiate stairs safely to allow home entry.  Feel he should continue to progress and likely not need follow up PT at d/c.  Will continue to follow during acute stay if not d/c.    Follow Up Recommendations  No PT follow up     Equipment Recommendations  None recommended by PT    Recommendations for Other Services       Precautions / Restrictions Precautions Precautions: Fall Restrictions Weight Bearing Restrictions: No    Mobility  Bed Mobility Overal bed mobility: Modified Independent                Transfers   Equipment used: None Transfers: Sit to/from Stand Sit to Stand: Supervision         General transfer comment: for safety with lines  Ambulation/Gait Ambulation/Gait assistance: Min guard;Supervision Ambulation Distance (Feet): 250 Feet Assistive device: None Gait Pattern/deviations: Step-through pattern;Decreased stride length     General Gait Details: no evidence of buckling, but noted some veering to sides with imbalance during direction changes and after standing rest, minguard for safe recovery   Stairs Stairs: Yes   Stair Management: One rail Left;Alternating pattern;Forwards Number of Stairs: 4 General stair comments: able to perform with step through pattern  without LOB, but minguard provided for safety  Wheelchair Mobility    Modified Rankin (Stroke Patients Only)       Balance     Sitting balance-Leahy Scale: Good       Standing balance-Leahy Scale: Good Standing balance comment: statically able to take some challenges, but some imbalance with ambulation on turns, and with starting after stopping                            Cognition Arousal/Alertness: Awake/alert Behavior During Therapy: WFL for tasks assessed/performed Overall Cognitive Status: Within Functional Limits for tasks assessed                                        Exercises      General Comments General comments (skin integrity, edema, etc.): Noted some inconsistencies in vitals with SpO2 reading low, then losing signal, noted on portable pulseoximiter SpO2 WNL      Pertinent Vitals/Pain Pain Assessment: No/denies pain    Home Living                      Prior Function            PT Goals (current goals can now be found in the care plan section) Progress towards PT goals: Progressing toward goals    Frequency    Min 3X/week      PT Plan Discharge plan needs to be  updated    Co-evaluation              AM-PAC PT "6 Clicks" Daily Activity  Outcome Measure  Difficulty turning over in bed (including adjusting bedclothes, sheets and blankets)?: None Difficulty moving from lying on back to sitting on the side of the bed? : None Difficulty sitting down on and standing up from a chair with arms (e.g., wheelchair, bedside commode, etc,.)?: None Help needed moving to and from a bed to chair (including a wheelchair)?: A Little Help needed walking in hospital room?: A Little Help needed climbing 3-5 steps with a railing? : A Little 6 Click Score: 21    End of Session Equipment Utilized During Treatment: Gait belt Activity Tolerance: Patient tolerated treatment well Patient left: in bed;with call bell/phone  within reach;with family/visitor present;with bed alarm set   PT Visit Diagnosis: Other abnormalities of gait and mobility (R26.89);Muscle weakness (generalized) (M62.81)     Time: 0943-1000 PT Time Calculation (min) (ACUTE ONLY): 17 min  Charges:  $Gait Training: 8-22 mins                    G CodesMagda Guzman, Michael Guzman 02/02/2017    Michael Guzman 02/02/2017, 10:16 AM

## 2017-02-03 ENCOUNTER — Other Ambulatory Visit: Payer: Self-pay | Admitting: *Deleted

## 2017-02-03 ENCOUNTER — Telehealth: Payer: Self-pay | Admitting: *Deleted

## 2017-02-03 LAB — COMPREHENSIVE METABOLIC PANEL
ALT: 17 U/L (ref 17–63)
ANION GAP: 10 (ref 5–15)
AST: 21 U/L (ref 15–41)
Albumin: 3 g/dL — ABNORMAL LOW (ref 3.5–5.0)
Alkaline Phosphatase: 76 U/L (ref 38–126)
BUN: 13 mg/dL (ref 6–20)
CHLORIDE: 104 mmol/L (ref 101–111)
CO2: 22 mmol/L (ref 22–32)
Calcium: 8.9 mg/dL (ref 8.9–10.3)
Creatinine, Ser: 0.88 mg/dL (ref 0.61–1.24)
GFR calc non Af Amer: >60 mL/min (ref >60)
Glucose, Bld: 243 mg/dL — ABNORMAL HIGH (ref 65–99)
Potassium: 3.5 mmol/L (ref 3.5–5.1)
SODIUM: 136 mmol/L (ref 135–145)
Total Bilirubin: 0.6 mg/dL (ref 0.3–1.2)
Total Protein: 5.2 g/dL — ABNORMAL LOW (ref 6.5–8.1)

## 2017-02-03 LAB — GLUCOSE, CAPILLARY
GLUCOSE-CAPILLARY: 311 mg/dL — AB (ref 65–99)
Glucose-Capillary: 184 mg/dL — ABNORMAL HIGH (ref 65–99)

## 2017-02-03 MED ORDER — INSULIN ASPART 100 UNIT/ML FLEXPEN: 6.0000 [IU] | PEN_INJECTOR | Freq: Three times a day (TID) | SUBCUTANEOUS | 11 refills | Status: DC

## 2017-02-03 MED ORDER — INSULIN GLARGINE 100 UNIT/ML ~~LOC~~ SOLN
30.0000 [IU] | Freq: Every day | SUBCUTANEOUS | 11 refills | Status: DC
Start: 2017-02-04 — End: 2017-02-04

## 2017-02-03 NOTE — Care Management Note (Addendum)
Case Management Note  Patient Details  Name: Michael Guzman MRN: [id number] Date of Birth: [birth year]  Subjective/Objective:     DM, CAD, CKD               Action/Plan: Discharge Planning:  NCM spoke to pt and offered choice for HH/list provided. Pt requested [facility] for [facility]. Pt wants HHRN but declining [place] Liaison with new referral. Pt states he has RW at home. Wife at home to assist with his care. Pt reports he uses Paediatric nurse on [facility]. He has glucometer at home that is sent to him in the mail.   PCP [name] MD  Expected Discharge Date:  02/03/17               Expected Discharge Plan:  [place]  In-House Referral:  NA  Discharge planning Services  CM Consult  Post Acute Care Choice:  Home Health Choice offered to:  Patient  DME Arranged:  N/A DME Agency:  NA  HH Arranged:  RN [place] Agency:  [place]  Status of Service:  Completed, signed off  If discussed at [place] of Stay Meetings, dates discussed:    Additional Comments:  Erenest Rasher, RN 02/03/2017, 10:50 AM

## 2017-02-03 NOTE — Care Management Important Message (Signed)
Important Message  Patient Details  Name: Michael Guzman MRN: [id number] Date of Birth: [birth year]   Medicare Important Message Given:  Yes    Nathen May 02/03/2017, 10:11 AM

## 2017-02-03 NOTE — Discharge Summary (Addendum)
Physician Discharge Summary  Michael Guzman HYW:[id number] DOB: 10-Dec-1945 DOA: 01/30/2017  PCP: [name], MD  Admit date: 01/30/2017 Discharge date: 02/03/2017  Time spent: 35 minutes  Recommendations for Outpatient Follow-up:  1. Needs counseling on Diabetes and continued OP education 2. Started insulin 6 u tid and lantus 30 this admit-given pens 3. Need glucometer 4. Continue metformin cautiously-needs labs in 1 week--have stopped enalipril 5. Need A1c and tsh 2 months-suggest OP follow up Dr. Cruzita Lederer 6. Needs interval routine follow up for Prostate cancer with his Urologist  Discharge Diagnoses:  Principal Problem:   DKA (diabetic ketoacidoses) ([place]) Active Problems:   Hypertension (1991)   T2_NIDDM w/ CKD2 (GFR 64 ml/min)   GERD   ASCAD s/p PTCA (06/2012)   Mixed hyperlipidemia   Malignant neoplasm of prostate (HCC)   Acute sinusitis   Elevated lactic acid level   Hyperkalemia   Acute renal failure superimposed on stage 2 chronic kidney disease ([place])   Discharge Condition: improved  Diet recommendation:  Diabetic and heart healthy  Filed Weights   01/30/17 1452 01/30/17 2221 02/02/17 1317  Weight: 82.6 kg (182 lb) 78.5 kg (173 lb 1 oz) 83.2 kg (183 lb 6.4 oz)   39   CAD s/p DES LAD Prior epsitaxis [facility] Rheum Arthritis affecting mainly UE  type II DM,  HTN,  Thyropiditis with Low TSH foll Dr. Cruzita Lederer GERD,  prostate cancer stage T1c  (s/p XRT 12/11-->10/21/16-alos on Lupron Follows with Dr. Junious Silk, Urology & Dr. Tammi Klippel, radiation oncologist),  stage II chronic kidney disease, recently treated by PCP with oral steroids and levofloxacin for acute sinusitis,   presented  5/19 with generalized weakness, polydipsia, polyuria and found to have DKA (blood sugar 444, anion gap 21, potassium 5.3 and bicarbonate 7).  Admitted to stepdown unit. Treated per DKA protocol. Glycemic control improved. AG normalized.   persistent NAG metabolic acidosis.? RTA type  IV.  Transitioned to Lantus, NovoLog SSI and started bicarbonate drip 5/21.     History of present illness:  DKA and type II DM, not at goal:On admit blood sugar of 444, anion ap 21, potassium 5.3, bicarbonate 7. initially on stepdown unit. Noncompliant with CBG monitoring or diet. Claims compliance with oral hypoglycemics. Last A1c on 01/15/17: was 10.5 = poor control.  hyperglycemia likely due to steroids initiated for acute sinusitis.   Glycemic control improved. AG normalized.  Transitioned to Lantus and sent home on 30 U, NovoLog SSI  Discussed at length with patient and spouse on 5/20, that he will likely need insulin-Patient has used insulin in the past  Holding all oral hypoglycemics (, glipizide and Jardiance) but resumed metformin on d/c Discussed with diabetes coordinator and their input appreciated. NAG metabolic acidosis/? RTA 4: persistent NAG metabolic acidosis despite IV fluids and management for approximately 36 hours. ? RTA type IV and started bicarbonate drip 5/21---d/c Gtt 5/22 and labs resolved on d/c Dehydration with osmotic hyponatremia 2/2 to hyperglycemia  Hyponatremia 125 on admit, now stable. Dehydration has clinically improved. Acute kidney injury complicating stage II chronic kidney disease:  on admit 39/1.6 and now improved Secondary to dehydration. Resolved. Holding enalapril as an OP Mild hyperkalemia 5.3 on admit:  Secondary to acute kidney injury in DKA. Resolved. Potassium actually is now 3.2 so replacing K Dur 40 mEq 02/02/17.  No replacement on d/c-get labs as an OP Essential hypertension:  Controlled. Continue metoprolol. Enalapril held due to recent AKI and hyperkalemia Hyperlipidemia:  Statins as op GERD:  Continue  PPI. Thyroiditis h/o with LOw TSH- OP Endocrine follow-up Prostate cancer (s/p XRT) & BPH on Lupron:  Continue tamsulosin. Follows outpatient with urology and radiation oncology. CAD:  Asymptomatic of chest pain. Continue aspirin, beta  blockers and statins. Generalized weakness:  Secondary to problem #1. CT head without acute findings. PT has evaluated and recommended home health PT. Acute sinusitis:   levofloxacin 500 stop date 5/23. Steroids discontinued. Blood cultures 2: Negative to date.    Discharge Exam: Vitals:   02/03/17 0533 02/03/17 0830  BP: 108/71 113/78  Pulse: 70 67  Resp: 18   Temp: 97.5 F (36.4 C)     General: awake alert in nad-thick neck, no jvd, no bruit Cardiovascular: s1 s2 no m/r/g, no added sound Respiratory: clear no added sound Neuro intact moves 4 limbs equally  Discharge Instructions    Current Discharge Medication List    START taking these medications   Details  insulin aspart (NOVOLOG FLEXPEN) 100 UNIT/ML FlexPen Inject 6 Units into the skin 3 (three) times daily with meals. Qty: 15 mL, Refills: 11    insulin glargine (LANTUS) 100 UNIT/ML injection Inject 0.3 mLs (30 Units total) into the skin daily. Qty: 10 mL, Refills: 11      CONTINUE these medications which have NOT CHANGED   Details  aspirin EC 81 MG tablet Take 81 mg by mouth daily.    atorvastatin (LIPITOR) 20 MG tablet Take 20 mg by mouth daily.    Cholecalciferol (VITAMIN D3) 5000 UNITS CAPS Take 5,000 Units by mouth 2 (two) times daily.    fluticasone (FLONASE) 50 MCG/ACT nasal spray Place 2 sprays into both nostrils daily. Qty: 16 g, Refills: 0    gabapentin (NEURONTIN) 600 MG tablet TAKE 1 TABLET 3 TIMES A DAY AS NEEDED FOR PAIN Qty: 270 tablet, Refills: 0   Associated Diagnoses: Primary osteoarthritis involving multiple joints    hydrocortisone (ANUSOL-HC) 25 MG suppository Place 25 mg rectally 2 (two) times daily as needed for hemorrhoids or itching.    Magnesium 250 MG TABS Take 250 mg by mouth daily.    megestrol (MEGACE) 20 MG tablet Take 20 mg by mouth 2 (two) times daily.    metFORMIN (GLUCOPHAGE-XR) 500 MG 24 hr tablet TAKE 1 TO 2 TABLETS BY  MOUTH TWO TIMES DAILY AS  DIRECTED FOR  DIABETES Qty: 360 tablet, Refills: 0   Associated Diagnoses: Diabetes mellitus without complication (HCC)    metoprolol succinate (TOPROL-XL) 25 MG 24 hr tablet Take 0.5 tablets (12.5 mg total) by mouth daily. Take with or immediately following a meal. Qty: 45 tablet, Refills: 3    montelukast (SINGULAIR) 10 MG tablet Take 10 mg by mouth at bedtime.     Multiple Vitamin (MULTIVITAMIN WITH MINERALS) TABS tablet Take 1 tablet by mouth daily.    nitroGLYCERIN (NITROSTAT) 0.4 MG SL tablet PLACE ONE TABLET UNDER THE TONGUE EVERY 5 MINUTES AS NEEDED FOR CHEST PAIN. Qty: 25 tablet, Refills: 6    Omega-3 Fatty Acids (FISH OIL) 1000 MG CAPS Take 1,000 mg by mouth daily.     ondansetron (ZOFRAN ODT) 4 MG disintegrating tablet Take 1 tablet (4 mg total) by mouth every 8 (eight) hours as needed for nausea or vomiting. Qty: 20 tablet, Refills: 0    pantoprazole (PROTONIX) 40 MG tablet TAKE 1 TABLET BY MOUTH  DAILY Qty: 90 tablet, Refills: 1    tamsulosin (FLOMAX) 0.4 MG CAPS capsule Take 0.4 mg by mouth at bedtime.    Blood Glucose Monitoring  Suppl (ONE TOUCH ULTRA SYSTEM KIT) w/Device KIT Check blood sugar 1 time daily-DX-E11.22 Qty: 1 each, Refills: 0    glucose blood test strip Check blood sugar 1 time daily-DX-E11.22 Qty: 100 each, Refills: 4      STOP taking these medications     empagliflozin (JARDIANCE) 25 MG TABS tablet      enalapril (VASOTEC) 2.5 MG tablet      glipiZIDE (GLUCOTROL) 5 MG tablet      levofloxacin (LEVAQUIN) 500 MG tablet      predniSONE (DELTASONE) 20 MG tablet      aspirin 81 MG tablet        No Known Allergies    The results of significant diagnostics from this hospitalization (including imaging, microbiology, ancillary and laboratory) are listed below for reference.    Significant Diagnostic Studies: Dg Chest 2 View  Result Date: 01/30/2017 CLINICAL DATA:  Weakness, shortness of breath, cough EXAM: CHEST  2 VIEW COMPARISON:  12/03/2016 FINDINGS:  Heart and mediastinal contours are within normal limits. No focal opacities or effusions. No acute bony abnormality. IMPRESSION: No active cardiopulmonary disease. Electronically Signed   By: Rolm Baptise M.D.   On: 01/30/2017 15:28   Ct Head Wo Contrast  Result Date: 01/30/2017 CLINICAL DATA:  Weakness and fatigue since yesterday. EXAM: CT HEAD WITHOUT CONTRAST TECHNIQUE: Contiguous axial images were obtained from the base of the skull through the vertex without intravenous contrast. COMPARISON:  None. FINDINGS: Brain: The ventricles and cisterns are within normal. There is evidence of age related atrophic change. There is minimal chronic ischemic microvascular disease. Possible tiny old lacunar infarct over the anterior limb right internal capsule. No mass, mass effect, shift midline structures or acute hemorrhage. No evidence of acute infarction. Vascular: No hyperdense vessel or unexpected calcification. Skull: Within normal. Sinuses/Orbits: Within normal. Other: None. IMPRESSION: No acute intracranial findings. Minimal chronic ischemic microvascular disease and age related atrophic change. Electronically Signed   By: Marin Olp M.D.   On: 01/30/2017 19:28    Microbiology: Recent Results (from the past 240 hour(s))  Culture, blood (routine x 2)     Status: None (Preliminary result)   Collection Time: 01/30/17  8:20 PM  Result Value Ref Range Status   Specimen Description BLOOD RIGHT HAND  Final   Special Requests IN PEDIATRIC BOTTLE Blood Culture adequate volume  Final   Culture NO GROWTH 3 DAYS  Final   Report Status PENDING  Incomplete  Culture, blood (routine x 2)     Status: None (Preliminary result)   Collection Time: 01/30/17  8:45 PM  Result Value Ref Range Status   Specimen Description BLOOD LEFT HAND  Final   Special Requests IN PEDIATRIC BOTTLE Blood Culture adequate volume  Final   Culture NO GROWTH 3 DAYS  Final   Report Status PENDING  Incomplete  MRSA PCR Screening      Status: None   Collection Time: 01/30/17 10:32 PM  Result Value Ref Range Status   MRSA by PCR NEGATIVE NEGATIVE Final    Comment:        The GeneXpert MRSA Assay (FDA approved for NASAL specimens only), is one component of a comprehensive MRSA colonization surveillance program. It is not intended to diagnose MRSA infection nor to guide or monitor treatment for MRSA infections.      Labs: Basic Metabolic Panel:  Recent Labs Lab 01/30/17 1805  02/01/17 1409 02/01/17 2040 02/02/17 0304 02/02/17 1037 02/03/17 0413  NA  --   < >  129* 131* 133* 132* 136  K  --   < > 4.2 3.5 3.3* 3.5 3.5  CL  --   < > 104 104 102 102 104  CO2  --   < > 15* 20* 21* 21* 22  GLUCOSE  --   < > 304* 226* 213* 264* 243*  BUN  --   < > _0 CREATININE  --   < > 0.96 0.91 0.96 0.85 0.88  CALCIUM  --   < > 8.8* 9.0 8.8* 8.9 8.9  MG 2.9*  --   --   --   --   --   --   PHOS 4.6  --   --   --   --   --   --   < > = values in this interval not displayed. Liver Function Tests:  Recent Labs Lab 01/30/17 1454 02/03/17 0413  AST 15 21  ALT 16* 17  ALKPHOS 72 76  BILITOT 1.7* 0.6  PROT 7.7 5.2*  ALBUMIN 4.2 3.0*   No results for input(s): LIPASE, AMYLASE in the last 168 hours. No results for input(s): AMMONIA in the last 168 hours. CBC:  Recent Labs Lab 01/27/17 1729 01/30/17 1454  WBC 5.2 10.3  NEUTROABS 3,536 9.1*  HGB 14.7 14.7  HCT 45.0 43.2  MCV 94.1 90.6  PLT 204 220   Cardiac Enzymes:  Recent Labs Lab 01/27/17 1729  CKTOTAL 51   BNP: BNP (last 3 results)  Recent Labs  12/03/16 1514  BNP 4.8    ProBNP (last 3 results) No results for input(s): PROBNP in the last 8760 hours.  CBG:  Recent Labs Lab 02/02/17 1157 02/02/17 1259 02/02/17 1708 02/02/17 2117 02/03/17 0753  GLUCAP 181* 164* 240* 213* 184*       Signed:  Nita Sells MD   [facility] 02/03/2017, 10:17 AM

## 2017-02-03 NOTE — Consult Note (Signed)
           [facility] Primary Care Navigator  02/03/2017  Michael Guzman Dec 08, 1945 [id number]   Wentto see patient at the bedside to identify possible discharge needs but he was alreadydischarged.  Patient was discharged home per staff with home health services [facility]).   Primary care provider's office called (Samantha)to notify of patient's discharge and need for post hospital follow-up and transition of care. Notified also of patient's health issues needing follow up. Aldona Bar states will notify PCP of these health needs. Made aware to refer patient to [facility] care management ifdeemed appropriatefor services.    For questions, please contact:  Dannielle Huh, BSN, RN- [facility] Primary Care Navigator  Telephone: [phone] [place]

## 2017-02-03 NOTE — Progress Notes (Signed)
[name] to be D/C'd Home per MD order.  Discussed with the patient and all questions fully answered.  VSS, Skin clean, dry and intact without evidence of skin break down, no evidence of skin tears noted. IV catheter discontinued intact. Site without signs and symptoms of complications. Dressing and pressure applied.  An After Visit Summary was printed and given to the patient. Patient received prescription.  D/c education completed with patient/family including follow up instructions, medication list, d/c activities limitations if indicated, with other d/c instructions as indicated by MD - patient able to verbalize understanding, all questions fully answered.   Patient instructed to return to ED, call 911, or call MD for any changes in condition.   Patient escorted via [place], and D/C home via private auto.  Luci Bank 02/03/2017 1:53 PM

## 2017-02-03 NOTE — Telephone Encounter (Signed)
PT IS SCHEDULED FOR A HOSP F/U TOMORROW 02/04/17

## 2017-02-04 ENCOUNTER — Ambulatory Visit (INDEPENDENT_AMBULATORY_CARE_PROVIDER_SITE_OTHER): Payer: Medicare Other | Admitting: Internal Medicine

## 2017-02-04 VITALS — BP 130/72 | HR 76 | Temp 97.5°F | Resp 16 | Ht 69.5 in | Wt 187.2 lb

## 2017-02-04 DIAGNOSIS — Z8639 Personal history of other endocrine, nutritional and metabolic disease: Secondary | ICD-10-CM | POA: Diagnosis not present

## 2017-02-04 DIAGNOSIS — E860 Dehydration: Secondary | ICD-10-CM | POA: Diagnosis not present

## 2017-02-04 DIAGNOSIS — I2510 Atherosclerotic heart disease of native coronary artery without angina pectoris: Secondary | ICD-10-CM | POA: Diagnosis not present

## 2017-02-04 DIAGNOSIS — Z79899 Other long term (current) drug therapy: Secondary | ICD-10-CM

## 2017-02-04 DIAGNOSIS — N182 Chronic kidney disease, stage 2 (mild): Secondary | ICD-10-CM | POA: Diagnosis not present

## 2017-02-04 DIAGNOSIS — I10 Essential (primary) hypertension: Secondary | ICD-10-CM

## 2017-02-04 DIAGNOSIS — E1165 Type 2 diabetes mellitus with hyperglycemia: Secondary | ICD-10-CM | POA: Diagnosis not present

## 2017-02-04 DIAGNOSIS — E1110 Type 2 diabetes mellitus with ketoacidosis without coma: Secondary | ICD-10-CM | POA: Diagnosis not present

## 2017-02-04 LAB — BASIC METABOLIC PANEL WITH GFR
BUN: 16 mg/dL (ref 7–25)
CALCIUM: 8.8 mg/dL (ref 8.6–10.3)
CO2: 23 mmol/L (ref 20–31)
Chloride: 105 mmol/L (ref 98–110)
Creat: 1.11 mg/dL (ref 0.70–1.25)
GFR, EST NON AFRICAN AMERICAN: 67 mL/min (ref >=60)
GFR, Est African American: 78 mL/min (ref >=60)
GLUCOSE: 289 mg/dL — AB (ref 65–99)
POTASSIUM: 4.4 mmol/L (ref 3.5–5.3)
SODIUM: 137 mmol/L (ref 135–146)

## 2017-02-04 LAB — CBC WITH DIFFERENTIAL/PLATELET
BASOS PCT: 1 %
Basophils Absolute: 44 cells/uL (ref 0–200)
EOS PCT: 4 %
Eosinophils Absolute: 176 cells/uL (ref 15–500)
HEMATOCRIT: 35.4 % — AB (ref 38.5–50.0)
Hemoglobin: 11.6 g/dL — ABNORMAL LOW (ref 13.2–17.1)
LYMPHS PCT: 19 %
Lymphs Abs: 836 cells/uL — ABNORMAL LOW (ref 850–3900)
MCH: 30.7 pg (ref 27.0–33.0)
MCHC: 32.8 g/dL (ref 32.0–36.0)
MCV: 93.7 fL (ref 80.0–100.0)
MONO ABS: 264 {cells}/uL (ref 200–950)
MPV: 11 fL (ref 7.5–12.5)
Monocytes Relative: 6 %
Neutro Abs: 3080 cells/uL (ref 1500–7800)
Neutrophils Relative %: 70 %
PLATELETS: 156 10*3/uL (ref 140–400)
RBC: 3.78 MIL/uL — AB (ref 4.20–5.80)
RDW: 12.9 % (ref 11.0–15.0)
WBC: 4.4 10*3/uL (ref 3.8–10.8)

## 2017-02-04 LAB — CULTURE, BLOOD (ROUTINE X 2)
CULTURE: NO GROWTH
Culture: NO GROWTH
SPECIAL REQUESTS: ADEQUATE
SPECIAL REQUESTS: ADEQUATE

## 2017-02-04 MED ORDER — EMPAGLIFLOZIN 25 MG PO TABS: 25.0000 mg | ORAL_TABLET | Freq: Every day | ORAL | 1 refills | Status: DC

## 2017-02-04 MED ORDER — GLIPIZIDE 5 MG PO TABS
ORAL_TABLET | ORAL | 0 refills | Status: DC
Start: 2017-02-04 — End: 2017-05-03

## 2017-02-04 NOTE — Patient Instructions (Signed)
[facility] [phone]

## 2017-02-04 NOTE — Progress Notes (Signed)
Michael Guzman     This very nice 71 y.o. MBM was admitted May 19-23 for DKA with glucose 444 mg% and bicarb 7 and presents for post hospital follow up.  Pre hospital, he had been treated with a steroid taper contributing to his hyperglycemia. Patient was transitioned to Lantus 30 u. And Novalog 6 units tid-ac.  Patient also has DM CKD2 with BUN/Creat  Of 26/1.24 at admission correcting to BUN/Creat of 13/0.88 normal on 02/03/17.  Patient indicates that he really prefers another try on oral agents to control his Diabetes. Patient was contacted post discharge by office staff to assure stability and schedule f/u.      Hospitalization discharge instructions and medications are reconciled with the patient.      Patient is also followed with Hypertension, Hyperlipidemia,  and Vitamin D Deficiency. Patient was Dx'd Aug 2017 with Prostate Ca and treated with ext XRT thru Feb 8. Also, he's received Lupron injections 06/05/18 and 12/09/16.      Patient is treated for HTN (1991)  & BP has been controlled at home.  Patient also has ASCAD with hx/o PCA/Stent sin 2013. Today's BP is at goal - 130/72. Patient has had no complaints of any cardiac type chest pain, palpitations, dyspnea/orthopnea/PND, dizziness, claudication, or dependent edema.     Hyperlipidemia is controlled with diet & meds. Patient denies myalgias or other med SE's. Last Lipids were  Lab Results  Component Value Date   CHOL 175 01/15/2017   HDL 48 01/15/2017   LDLCALC 68 01/15/2017   LDLDIRECT 45.7 10/30/2013   TRIG 297 (H) 01/15/2017   CHOLHDL 3.6 01/15/2017      Also, the patient has history of T2_NIDDM  (1998)  and has had no symptoms of reactive hypoglycemia, diabetic polys, paresthesias or visual blurring.  Recent  A1c was  Lab Results  Component Value Date   HGBA1C 10.5 (H) 01/15/2017      Further, the patient also has history of Vitamin D Deficiency of "15" in 2008 and supplements vitamin D without any suspected  side-effects. Last vitamin D was   Lab Results  Component Value Date   VD25OH 68 01/15/2017   Current Outpatient Prescriptions on File Prior to Visit  Medication Sig  . aspirin EC 81 MG Take 81 mg by mouth daily.  Marland Kitchen atorvastatin  20 MG  Take 20 mg by mouth daily.  Marland Kitchen VITAMIN D 5000 UNITS  Take 5,000 Units by mouth 2 (two) times daily.  Marland Kitchen FLONASE  nasal spray Place 2 sprays into both nostrils daily.  . Gabapentin 600 MG  TAKE 1 TAB 3 x A DAY AS NEEDED FOR PAIN)  . ANUSOL-HC 25 MG supp Place 25 mg rectally 2  x daily as needed   . NOVOLOG FLEXPEN Inject 6 Units into the skin 3 (three) times daily with meals.  Marland Kitchen LANTUS  injec Inject 0.3 mLs (30 Units total) into the skin daily.  . Magnesium 250 MG  Take 250 mg by mouth daily.  . megestrol  20 MG  Take 20 mg by mouth 2 (two) times daily.  . metFORMIN-XR 500 MG  TAKE 1 TO 2 TAB 2 x DAILY   . metoprolol succ-XL 25 MG  Take 25 mg by mouth daily.   . montelukast  10 MG Take 10 mg by mouth at bedtime.   . Multi-Vit w/Min Take 1 tablet by mouth daily.  Marland Kitchen NITROSTAT 0.4 MG SL AS NEEDED FOR CHEST PAIN.  Marland Kitchen Omega-3  FISH OIL 1000 MG Take 1,000 mg by mouth daily.   . ondansetron ODT) 4 MG  Take 1 tab every 8 hours as needed for nausea  . pantoprazole  40 MG  TAKE 1 TAB  DAILY)  . tamsulosin  0.4 MG  Take  at bedtime.   No Known Allergies  PMHx:   Past Medical History:  Diagnosis Date  . Arthritis   . CAD (coronary artery disease)   . Chronic back pain   . Chronically dry eyes   . Diabetes mellitus without complication ([place])   . Elevated PSA    being monitored  . GERD (gastroesophageal reflux disease)   . Hypertension   . Prostate cancer ([place])    Immunization History  Administered Date(s) Administered  . Influenza Split 06/26/2012  . Influenza, High Dose Seasonal PF 06/20/2015, 06/23/2016  . Influenza-Unspecified 06/14/2013, 06/25/2014  . Pneumococcal Conjugate-13 04/23/2016  . Pneumococcal-Unspecified 06/16/2010  . Td 09/17/2003  .  Tdap 11/26/2013  . Zoster 07/12/2012   Past Surgical History:  Procedure Laterality Date  . APPENDECTOMY    . CARDIAC SURGERY     3 stent placed.  . COLONOSCOPY    . LEFT HEART CATHETERIZATION WITH CORONARY ANGIOGRAM N/A 06/27/2012   Procedure: LEFT HEART CATHETERIZATION WITH CORONARY ANGIOGRAM;  Surgeon: Candee Furbish, MD;  Location[facility] C[facility] LAB;  Service: Cardiovascular;  Laterality: N/A;  . LEFT HEART CATHETERIZATION WITH CORONARY ANGIOGRAM N/A 07/25/2012   Procedure: LEFT HEART CATHETERIZATION WITH CORONARY ANGIOGRAM;  Surgeon: Sueanne Margarita, MD;  Location: [place] CATH LAB;  Service: Cardiovascular;  Laterality: N/A;  . PROSTATE BIOPSY    . RADIOLOGY WITH ANESTHESIA N/A 12/03/2015   Procedure: MRI LUMBAR SPINE;  Surgeon: Medication Radiologist, MD;  Location: [place];  Service: Radiology;  Laterality: N/A;   FHx:    Reviewed / unchanged  SHx:    Reviewed / unchanged  Systems Review:  Constitutional: Denies fever, chills, wt changes, headaches, insomnia, fatigue, night sweats, change in appetite. Eyes: Denies redness, blurred vision, diplopia, discharge, itchy, watery eyes.  ENT: Denies discharge, congestion, post nasal drip, epistaxis, sore throat, earache, hearing loss, dental pain, tinnitus, vertigo, sinus pain, snoring.  CV: Denies chest pain, palpitations, irregular heartbeat, syncope, dyspnea, diaphoresis, orthopnea, PND, claudication or edema. Respiratory: denies cough, dyspnea, DOE, pleurisy, hoarseness, laryngitis, wheezing.  Gastrointestinal: Denies dysphagia, odynophagia, heartburn, reflux, water brash, abdominal pain or cramps, nausea, vomiting, bloating, diarrhea, constipation, hematemesis, melena, hematochezia  or hemorrhoids. Genitourinary: Denies dysuria, frequency, urgency, nocturia, hesitancy, discharge, hematuria or flank pain. Musculoskeletal: Denies arthralgias, myalgias, stiffness, jt. swelling, pain, limping or strain/sprain.  Skin: Denies pruritus, rash, hives, warts,  acne, eczema or change in skin lesion(s). Neuro: No weakness, tremor, incoordination, spasms, paresthesia or pain. Psychiatric: Denies confusion, memory loss or sensory loss. Endo: Denies change in weight, skin or hair change.  Heme/Lymph: No excessive bleeding, bruising or enlarged lymph nodes.  Physical Exam  BP 130/72   Pulse 76   Temp 97.5 F (36.4 C)   Resp 16   Ht 5' 9.5" (1.765 m)   Wt 187 lb 3.2 oz (84.9 kg)   BMI 27.25 kg/m   Appears well nourished, well groomed  and in no distress.  Eyes: PERRLA, EOMs, conjunctiva no swelling or erythema. Sinuses: No frontal/maxillary tenderness ENT/Mouth: EAC's clear, TM's nl w/o erythema, bulging. Nares clear w/o erythema, swelling, exudates. Oropharynx clear without erythema or exudates. Oral hygiene is good. Tongue normal, non obstructing. Hearing intact.  Neck: Supple. Thyroid nl. Car 2+/2+  without bruits, nodes or JVD. Chest: Respirations nl with BS clear & equal w/o rales, rhonchi, wheezing or stridor.  Cor: Heart sounds normal w/ regular rate and rhythm without sig. murmurs, gallops, clicks or rubs. Peripheral pulses normal and equal  without edema.  Abdomen: Soft & bowel sounds normal. Non-tender w/o guarding, rebound, hernias, masses or organomegaly.  Lymphatics: Unremarkable.  Musculoskeletal: Full ROM all peripheral extremities, joint stability, 5/5 strength and normal gait.  Skin: Warm, dry without exposed rashes, lesions or ecchymosis apparent.  Neuro: Cranial nerves intact, reflexes equal bilaterally. Sensory-motor testing grossly intact. Tendon reflexes grossly intact.  Pysch: Alert & oriented x 3.  Insight and judgement nl & appropriate. No ideations.  Assessment and Plan:  1. Diabetic ketoacidosis without coma associated with type 2 diabetes mellitus ([place])  -  Recommended stricter diet, exercise, lifestyle modifications.  - Monitor appropriate labs.  - BASIC METABOLIC PANEL WITH GFR  2. Poorly controlled type 2  diabetes mellitus ([place])  - As patient is stabilized and is adamant that he would prefer another trial on oral agents in consideration of the expense of insulins- insulin dosing is deferred. Hence he will resume his Metformin & Glipizide. Along with a more trial on a better diet.   - BASIC METABOLIC PANEL WITH GFR  3. Dehydration  - BASIC METABOLIC PANEL WITH GFR  4. Essential hypertension  - Continue medication, monitor blood pressure at home.  - Continue DASH diet. Reminder to go to the ER if any CP,  SOB, nausea, dizziness, severe HA, changes vision/speech,  left arm numbness and tingling and jaw pain.  - CBC with Differential/Platelet - BASIC METABOLIC PANEL WITH GFR  5. Medication management  - CBC with Differential/Platelet - BASIC METABOLIC PANEL WITH GFR       Discussed  regular exercise, BP monitoring, weight control to achieve/maintain BMI less than 25 and discussed med and SE's. Recommended labs to assess and monitor clinical status with further disposition pending results of labs. Over 50 minutes of exam, counseling, chart review was performed.

## 2017-02-06 ENCOUNTER — Encounter: Payer: Self-pay | Admitting: Internal Medicine

## 2017-02-10 DIAGNOSIS — I2510 Atherosclerotic heart disease of native coronary artery without angina pectoris: Secondary | ICD-10-CM | POA: Diagnosis not present

## 2017-02-10 DIAGNOSIS — I10 Essential (primary) hypertension: Secondary | ICD-10-CM | POA: Diagnosis not present

## 2017-02-10 DIAGNOSIS — E1165 Type 2 diabetes mellitus with hyperglycemia: Secondary | ICD-10-CM | POA: Diagnosis not present

## 2017-02-10 DIAGNOSIS — N182 Chronic kidney disease, stage 2 (mild): Secondary | ICD-10-CM | POA: Diagnosis not present

## 2017-02-10 DIAGNOSIS — Z8639 Personal history of other endocrine, nutritional and metabolic disease: Secondary | ICD-10-CM | POA: Diagnosis not present

## 2017-02-11 ENCOUNTER — Ambulatory Visit: Payer: Self-pay | Admitting: Internal Medicine

## 2017-02-17 ENCOUNTER — Ambulatory Visit (INDEPENDENT_AMBULATORY_CARE_PROVIDER_SITE_OTHER): Payer: Medicare Other | Admitting: Internal Medicine

## 2017-02-17 ENCOUNTER — Encounter: Payer: Self-pay | Admitting: Internal Medicine

## 2017-02-17 VITALS — BP 114/72 | HR 80 | Temp 97.9°F | Resp 16 | Ht 69.5 in | Wt 185.2 lb

## 2017-02-17 DIAGNOSIS — E1165 Type 2 diabetes mellitus with hyperglycemia: Secondary | ICD-10-CM

## 2017-02-17 DIAGNOSIS — I10 Essential (primary) hypertension: Secondary | ICD-10-CM | POA: Diagnosis not present

## 2017-02-17 DIAGNOSIS — N182 Chronic kidney disease, stage 2 (mild): Secondary | ICD-10-CM | POA: Diagnosis not present

## 2017-02-17 DIAGNOSIS — Z8639 Personal history of other endocrine, nutritional and metabolic disease: Secondary | ICD-10-CM | POA: Diagnosis not present

## 2017-02-17 DIAGNOSIS — I2510 Atherosclerotic heart disease of native coronary artery without angina pectoris: Secondary | ICD-10-CM | POA: Diagnosis not present

## 2017-02-17 NOTE — Progress Notes (Signed)
Subjective:    Patient ID: Michael Guzman, male    DOB: Jul 28, 1946, 71 y.o.   MRN: [id number]  HPI This nice 71 yo MBM recently hospitalized with DKA May 19-23 returns for f/u from [place] 5/24 and was to be transitioned from Lantus w/Novolog mealtime SS to BID Novolin 70/30 for cost considetrations. Apparently he has not yet done that as he's trying to use up the bottle of Lantus that he purchased. He does report that he feels better and is very vague about the frequency of CBG monitoring. He denies any diabetic polys, paresthesias, visual blurring, hypoglycemia or hypoglycemia.  Likewise CV systems review is negative.   Medication Sig  . aspirin EC 81 MG tablet Take 81 mg by mouth daily.  Marland Kitchen atorvastatin (LIPITOR) 20 MG tablet Take 20 mg by mouth daily.  . Cholecalciferol (VITAMIN D3) 5000 UNITS CAPS Take 5,000 Units by mouth 2 (two) times daily.  . empagliflozin (JARDIANCE) 25 MG TABS tablet Take 25 mg by mouth daily.  . fluticasone (FLONASE) 50 MCG/ACT nasal spray Place 2 sprays into both nostrils daily.  Marland Kitchen gabapentin (NEURONTIN) 600 MG tablet TAKE 1 TABLET 3 TIMES A DAY AS NEEDED FOR PAIN (Patient taking differently: TAKE 1 TABLET (600 MG) BY MOUTH 3 TIMES A DAY AS NEEDED FOR PAIN)  . glipiZIDE (GLUCOTROL) 5 MG tablet TAKE 1 TABLET BY MOUTH 3  TIMES DAILY FOR GLUCOSE  OVER 200 OR AS DIRECTED BY  YOUR DOCTOR.  Marland Kitchen glucose blood test strip Check blood sugar 1 time daily-DX-E11.22  . hydrocortisone (ANUSOL-HC) 25 MG suppository Place 25 mg rectally 2 (two) times daily as needed for hemorrhoids or itching.  . Magnesium 250 MG TABS Take 250 mg by mouth daily.  . megestrol (MEGACE) 20 MG tablet Take 20 mg by mouth 2 (two) times daily.  . metFORMIN (GLUCOPHAGE-XR) 500 MG 24 hr tablet TAKE 1 TO 2 TABLETS BY  MOUTH TWO TIMES DAILY AS  DIRECTED FOR DIABETES (Patient taking differently: TAKE 1 TABLET (500 MG) BY MOUTH THREE TIMES DAILY OR AS DIRECTED FOR DIABETES)  . metoprolol succinate (TOPROL-XL) 25 MG  24 hr tablet Take 0.5 tablets (12.5 mg total) by mouth daily. Take with or immediately following a meal. (Patient taking differently: Take 25 mg by mouth daily. Take with or immediately following a meal.)  . montelukast (SINGULAIR) 10 MG tablet Take 10 mg by mouth at bedtime.   . Multiple Vitamin (MULTIVITAMIN WITH MINERALS) TABS tablet Take 1 tablet by mouth daily.  . nitroGLYCERIN (NITROSTAT) 0.4 MG SL tablet PLACE ONE TABLET UNDER THE TONGUE EVERY 5 MINUTES AS NEEDED FOR CHEST PAIN.  Marland Kitchen Omega-3 Fatty Acids (FISH OIL) 1000 MG CAPS Take 1,000 mg by mouth daily.   . ondansetron (ZOFRAN ODT) 4 MG disintegrating tablet Take 1 tablet (4 mg total) by mouth every 8 (eight) hours as needed for nausea or vomiting.  . pantoprazole (PROTONIX) 40 MG tablet TAKE 1 TABLET BY MOUTH  DAILY (Patient taking differently: TAKE 1 TABLET (40 MG) BY MOUTH  DAILY)  . tamsulosin (FLOMAX) 0.4 MG CAPS capsule Take 0.4 mg by mouth at bedtime.   No Active Allergies   Past Medical History:  Diagnosis Date  . Arthritis   . CAD (coronary artery disease)   . Chronic back pain   . Chronically dry eyes   . Diabetes mellitus without complication ([place])   . Elevated PSA    being monitored  . GERD (gastroesophageal reflux disease)   . Hypertension   .  Prostate cancer [facility])    Review of Systems  10 point systems review negative except as above.    Objective:   Physical Exam   BP 114/72   Pulse 80   Temp 97.9 F (36.6 C)   Resp 16   Ht 5' 9.5" (1.765 m)   Wt 185 lb 3.2 oz (84 kg)   BMI 26.96 kg/m   HEENT - WNL. Neck - supple.  Chest - Clear equal BS. Cor - Nl HS. RRR w/o sig MGR. PP 1(+). No edema. MS- FROM w/o deformities.  Gait Nl. Neuro -  Nl w/o focal abnormalities.    Assessment & Plan:   1. Poorly controlled type 2 diabetes mellitus ([place])  - Discussed diet , meds , monitoring, hypoglycemic precautions and encouraged to call if has problems/questions  - ROV 1 month

## 2017-02-17 NOTE — Patient Instructions (Signed)
Recc switch to Novolin 70/30 mix and   take 20 units am w/ Bkfst   and 10 units pm w/supper

## 2017-02-20 ENCOUNTER — Other Ambulatory Visit: Payer: Self-pay | Admitting: Physician Assistant

## 2017-02-20 ENCOUNTER — Other Ambulatory Visit: Payer: Self-pay | Admitting: Cardiology

## 2017-03-04 DIAGNOSIS — M25551 Pain in right hip: Secondary | ICD-10-CM | POA: Diagnosis not present

## 2017-03-07 ENCOUNTER — Encounter: Payer: Self-pay | Admitting: Internal Medicine

## 2017-04-01 ENCOUNTER — Encounter: Payer: Self-pay | Admitting: Internal Medicine

## 2017-04-01 ENCOUNTER — Ambulatory Visit (INDEPENDENT_AMBULATORY_CARE_PROVIDER_SITE_OTHER): Payer: Medicare Other | Admitting: Internal Medicine

## 2017-04-01 VITALS — BP 130/84 | HR 81 | Temp 97.4°F | Resp 16 | Ht 69.5 in | Wt 191.0 lb

## 2017-04-01 DIAGNOSIS — I10 Essential (primary) hypertension: Secondary | ICD-10-CM

## 2017-04-01 DIAGNOSIS — N182 Chronic kidney disease, stage 2 (mild): Secondary | ICD-10-CM

## 2017-04-01 DIAGNOSIS — E119 Type 2 diabetes mellitus without complications: Secondary | ICD-10-CM | POA: Diagnosis not present

## 2017-04-01 DIAGNOSIS — E1165 Type 2 diabetes mellitus with hyperglycemia: Secondary | ICD-10-CM | POA: Diagnosis not present

## 2017-04-01 DIAGNOSIS — Z794 Long term (current) use of insulin: Secondary | ICD-10-CM

## 2017-04-01 DIAGNOSIS — E1122 Type 2 diabetes mellitus with diabetic chronic kidney disease: Secondary | ICD-10-CM | POA: Diagnosis not present

## 2017-04-01 NOTE — Progress Notes (Signed)
[facility]   [name], M.D.      Uvaldo Bristle. Silverio Lay, P.A.-C [facility]                [address] [place] [address] Telephone [phone] Telefax [phone]  Subjective:    Patient ID: Michael Guzman, male    DOB: July 25, 1946, 71 y.o.   MRN: [id number]  HPI  This very nice 71 yo MBM with HTN and T2_DM (1998) was hospitalized 5/19-23 with DKA and switched from oral agents to Lantus/SS Novo log and due to the high co pay cost , he elected further switch to bid dosing on Novolin 70/30 mix. He apparently misunderstood instructions to take 20 & 10 units BID am & pm and only took 10 units bid along with his MF 500 XR TID, Jardiance, & Glipizide 5 mg tid. He Fasting glucose have ranged 180 - high 200's and afternoon CBG's in the range 250-300's. His reliability  W/ bid monitoring is questionable. He denies hypoglycemic sx's as well as diabetic poly's, paresthesias or visual blurring.     He does report ongoing head/nasal sinus congestion with clear nasal secretions.   Medication Sig  . aspirin EC 81 MG tablet Take 81 mg by mouth daily.  Marland Kitchen atorvastatin (LIPITOR) 20 MG tablet TAKE 1 TABLET BY MOUTH  DAILY  . Blood Glucose Monitoring Suppl (ONE TOUCH ULTRA SYSTEM KIT) w/Device KIT Check blood sugar 1 time daily-DX-E11.22  . Cholecalciferol (VITAMIN D3) 5000 UNITS CAPS Take 5,000 Units by mouth 2 (two) times daily.  . empagliflozin (JARDIANCE) 25 MG TABS tablet Take 25 mg by mouth daily.  . fluticasone (FLONASE) 50 MCG/ACT nasal spray Place 2 sprays into both nostrils daily.  Marland Kitchen gabapentin (NEURONTIN) 600 MG tablet TAKE 1 TABLET 3 TIMES A DAY AS NEEDED FOR PAIN (Patient taking differently: TAKE 1 TABLET (600 MG) BY MOUTH 3 TIMES A DAY AS NEEDED FOR PAIN)  . glipiZIDE (GLUCOTROL) 5 MG tablet TAKE 1 TABLET BY MOUTH 3  TIMES DAILY FOR GLUCOSE  OVER 200 OR AS DIRECTED BY  YOUR DOCTOR.  Marland Kitchen glucose blood  test strip Check blood sugar 1 time daily-DX-E11.22  . hydrocortisone (ANUSOL-HC) 25 MG suppository Place 25 mg rectally 2 (two) times daily as needed for hemorrhoids or itching.  . Magnesium 250 MG TABS Take 250 mg by mouth daily.  . megestrol (MEGACE) 20 MG tablet Take 20 mg by mouth 2 (two) times daily.  . metFORMIN (GLUCOPHAGE-XR) 500 MG 24 hr tablet TAKE 1 TO 2 TABLETS BY  MOUTH TWO TIMES DAILY AS  DIRECTED FOR DIABETES (Patient taking differently: TAKE 1 TABLET (500 MG) BY MOUTH THREE TIMES DAILY OR AS DIRECTED FOR DIABETES)  . metoprolol succinate (TOPROL-XL) 25 MG 24 hr tablet TAKE ONE-HALF TABLET BY  MOUTH DAILY. TAKE WITH OR  IMMEDIATELY FOLLOWING A  MEAL.  . montelukast (SINGULAIR) 10 MG tablet Take 10 mg by mouth at bedtime.   . Multiple Vitamin (MULTIVITAMIN WITH MINERALS) TABS tablet Take 1 tablet by mouth daily.  . nitroGLYCERIN (NITROSTAT) 0.4 MG SL tablet PLACE ONE TABLET UNDER THE TONGUE EVERY 5 MINUTES AS NEEDED FOR CHEST PAIN.  Marland Kitchen Omega-3 Fatty Acids (FISH OIL) 1000 MG CAPS Take 1,000 mg by mouth daily.   . ondansetron (ZOFRAN ODT) 4 MG disintegrating tablet Take 1 tablet (4 mg total) by mouth every 8 (  eight) hours as needed for nausea or vomiting.  . pantoprazole (PROTONIX) 40 MG tablet TAKE 1 TABLET BY MOUTH  DAILY (Patient taking differently: TAKE 1 TABLET (40 MG) BY MOUTH  DAILY)  . tamsulosin (FLOMAX) 0.4 MG CAPS capsule Take 0.4 mg by mouth at bedtime.   No Active Allergies Past Medical History:  Diagnosis Date  . Arthritis   . CAD (coronary artery disease)   . Chronic back pain   . Chronically dry eyes   . Diabetes mellitus without complication ([place])   . Elevated PSA    being monitored  . GERD (gastroesophageal reflux disease)   . Hypertension   . Prostate cancer [facility])    Past Surgical History:  Procedure Laterality Date  . APPENDECTOMY    . CARDIAC SURGERY     3 stent placed.  . COLONOSCOPY    . LEFT HEART CATHETERIZATION WITH CORONARY ANGIOGRAM N/A  06/27/2012   Procedure: LEFT HEART CATHETERIZATION WITH CORONARY ANGIOGRAM;  Surgeon: Candee Furbish, MD;  Location: [facility] CATH LAB;  Service: Cardiovascular;  Laterality: N/A;  . LEFT HEART CATHETERIZATION WITH CORONARY ANGIOGRAM N/A 07/25/2012   Procedure: LEFT HEART CATHETERIZATION WITH CORONARY ANGIOGRAM;  Surgeon: Sueanne Margarita, MD;  Location: [place] CATH LAB;  Service: Cardiovascular;  Laterality: N/A;  . PROSTATE BIOPSY    . RADIOLOGY WITH ANESTHESIA N/A 12/03/2015   Procedure: MRI LUMBAR SPINE;  Surgeon: Medication Radiologist, MD;  Location: [place];  Service: Radiology;  Laterality: N/A;   Review of Systems  10 point systems review negative except as above.    Objective:   Physical Exam  BP 130/84   Pulse 81   Temp (!) 97.4 F (36.3 C)   Resp 16   Ht 5' 9.5" (1.765 m)   Wt 191 lb (86.6 kg)   BMI 27.80 kg/m   HEENT - WNL. Neck - supple.  Chest - Clear equal BS. Cor - Nl HS. RRR w/o sig MGR. PP 1(+). No edema. MS- FROM w/o deformities.  Gait Nl. Neuro -  Nl w/o focal abnormalities.    Assessment & Plan:   1. Essential hypertension   2. Poorly controlled type 2 diabetes mellitus (HCC)  Advised increase Novolin 70/30 mix to 25 u qam and 20 u qpm.  Dicussed hypoglycemic sx's and encouraged to call if any questions.  3. Insulin-requiring or dependent type II diabetes mellitus ([place])   4. Type 2 diabetes mellitus with stage 2 chronic kidney disease, without long-term current use of insulin ([place])  - Long discussion re: importance of diet and meticulous glucose monitoring.   - Over 25 minutes of exam, counseling, chart review and  complex critical decision making was performed

## 2017-04-01 NOTE — Patient Instructions (Signed)

## 2017-04-02 ENCOUNTER — Other Ambulatory Visit: Payer: Self-pay | Admitting: Internal Medicine

## 2017-04-08 DIAGNOSIS — H524 Presbyopia: Secondary | ICD-10-CM | POA: Diagnosis not present

## 2017-04-08 DIAGNOSIS — Z794 Long term (current) use of insulin: Secondary | ICD-10-CM | POA: Diagnosis not present

## 2017-04-08 DIAGNOSIS — H2513 Age-related nuclear cataract, bilateral: Secondary | ICD-10-CM | POA: Diagnosis not present

## 2017-04-08 DIAGNOSIS — E119 Type 2 diabetes mellitus without complications: Secondary | ICD-10-CM | POA: Diagnosis not present

## 2017-04-15 ENCOUNTER — Encounter: Payer: Self-pay | Admitting: Internal Medicine

## 2017-04-26 ENCOUNTER — Other Ambulatory Visit: Payer: Self-pay | Admitting: Physician Assistant

## 2017-04-26 ENCOUNTER — Other Ambulatory Visit: Payer: Self-pay | Admitting: Internal Medicine

## 2017-04-26 DIAGNOSIS — M150 Primary generalized (osteo)arthritis: Principal | ICD-10-CM

## 2017-04-26 DIAGNOSIS — M159 Polyosteoarthritis, unspecified: Secondary | ICD-10-CM

## 2017-04-26 DIAGNOSIS — M8949 Other hypertrophic osteoarthropathy, multiple sites: Secondary | ICD-10-CM

## 2017-05-01 NOTE — Progress Notes (Signed)
MEDICARE ANNUAL WELLNESS VISIT AND FOLLOW UP Assessment:    Essential hypertension - continue medications, DASH diet, exercise and monitor at home. Call if greater than 130/80.  -     CBC with Differential/Platelet -     BASIC METABOLIC PANEL WITH GFR -     Hepatic function panel  Type 2 diabetes mellitus with stage 2 chronic kidney disease, with long-term current use of insulin (HCC) Will send to DM educator, need list of sugars, check 2 x a day and bring in food log Follow up 1 month -     Hemoglobin A1c -     Ambulatory referral to diabetic education -     insulin NPH-regular Human (NOVOLIN 70/30) (70-30) 100 UNIT/ML injection; Inject 25 units in the AM Michael Guzman with food, and inject 20 units [name] with evening meal or as directed  ASCAD s/p PTCA (06/2012) Control blood pressure, cholesterol, glucose, increase exercise.   Gastroesophageal reflux disease without esophagitis Continue PPI/H2 blocker, diet discussed  Diabetic peripheral neuropathy (HCC) -     Hemoglobin A1c Will send to DM educator, need list of sugars, check 2 x a day and bring in food log Follow up 1 month -     Ambulatory referral to diabetic education -     insulin NPH-regular Human (NOVOLIN 70/30) (70-30) 100 UNIT/ML injection; Inject 25 units in the AM Michael Guzman with food, and inject 20 units [name] with evening meal or as directed  Thyroiditis Monitor, normal scan 2017 -     TSH  Malignant neoplasm of prostate ([name]) Continue follow up  Mixed hyperlipidemia -continue medications, check lipids, decrease fatty foods, increase activity.  -     Lipid panel  Medication management  Encounter for long-term (current) insulin use ([name]) -     Magnesium -     Ambulatory referral to diabetic education -     insulin NPH-regular Human (NOVOLIN 70/30) (70-30) 100 UNIT/ML injection; Inject 25 units in the AM Michael Guzman with food, and inject 20 units Michael Guzman with evening meal or as directed  Tortuous aorta (HCC) Control blood pressure,  cholesterol, glucose, increase exercise.   Atherosclerosis of aorta (HCC) Control blood pressure, cholesterol, glucose, increase exercise.   Advanced care planning/counseling discussion Discussed with patient, will bring in papers  Encounter for Medicare annual wellness exam 1 year  Over 30 minutes of exam, counseling, chart review, and critical decision making was performed  Future Appointments Date Time Provider [place]  05/13/2017 2:00 PM Philemon Kingdom, MD [facility] None  08/04/2017 10:30 AM [name], MD [facility] None  02/08/2018 10:00 AM [name], MD [facility] None     Plan:   During the course of the visit the patient was educated and counseled about appropriate screening and preventive services including:    Pneumococcal vaccine   Influenza vaccine  Prevnar 13  Td vaccine  Screening electrocardiogram  Colorectal cancer screening  Diabetes screening  Glaucoma screening  Nutrition counseling    Subjective:  Michael Guzman is a 71 y.o. male who presents for Medicare Annual Wellness Visit and 3 month follow up for HTN, hyperlipidemia, prediabetes, and vitamin D Def.   His blood pressure has been controlled at home, today their BP is BP: 124/80 He does not workout. He denies chest pain, shortness of breath, dizziness. He has a history of CAD s/p DES to LAD/OM2 in 2013, last stress test 04/2016, follows with Dr. Marlou Porch. Has history of prostate cancer s/p radiation and on lupron injections.  Need  MG labs.  He is on cholesterol medication and denies myalgias. His cholesterol is not at goal. The cholesterol last visit was:   Lab Results  Component Value Date   CHOL 175 01/15/2017   HDL 48 01/15/2017   LDLCALC 68 01/15/2017   LDLDIRECT 45.7 10/30/2013   TRIG 297 (H) 01/15/2017   CHOLHDL 3.6 01/15/2017   He has been working on diet and exercise for Diabetes with diabetic chronic kidney disease, with other circulatory  complications and with diabetic polyneuropathy and recent hospitalization for ketoacidosis in May, he is on bASA, he is not on ACE/ARB, sugars in the AM are 170-220 25 in the AM and 20 in the afternoon, and denies paresthesia of the feet, polydipsia and polyuria. He follows with Dr. Renne Crigler.  Last A1C was:  Lab Results  Component Value Date   HGBA1C 10.5 (H) 01/15/2017   Lab Results  Component Value Date   GFRAA 78 02/04/2017   He has a history of thyroiditis.  Lab Results  Component Value Date   TSH 0.49 01/15/2017  . Patient is on Vitamin D supplement.   Lab Results  Component Value Date   VD25OH 68 01/15/2017     BMI is Body mass index is 28.15 kg/m., he is working on diet and exercise. Wt Readings from Last 3 Encounters:  05/03/17 193 lb 6.4 oz (87.7 kg)  04/01/17 191 lb (86.6 kg)  02/17/17 185 lb 3.2 oz (84 kg)     Medication Review: Current Outpatient Prescriptions on File Prior to Visit  Medication Sig Dispense Refill  . aspirin EC 81 MG tablet Take 81 mg by mouth daily.    Marland Kitchen atorvastatin (LIPITOR) 20 MG tablet TAKE 1 TABLET BY MOUTH  DAILY 90 tablet 2  . Blood Glucose Monitoring Suppl (ONE TOUCH ULTRA SYSTEM KIT) w/Device KIT Check blood sugar 1 time daily-DX-E11.22 1 each 0  . Cholecalciferol (VITAMIN D3) 5000 UNITS CAPS Take 5,000 Units by mouth 2 (two) times daily.    . empagliflozin (JARDIANCE) 25 MG TABS tablet Take 25 mg by mouth daily. 90 tablet 1  . fluticasone (FLONASE) 50 MCG/ACT nasal spray Place 2 sprays into both nostrils daily. 16 g 0  . gabapentin (NEURONTIN) 600 MG tablet TAKE 1 TABLET BY MOUTH 3  TIMES A DAY AS NEEDED FOR  PAIN 270 tablet 1  . glipiZIDE (GLUCOTROL) 5 MG tablet TAKE 1 TABLET BY MOUTH 3  TIMES DAILY FOR GLUCOSE  OVER 200 OR AS DIRECTED BY  YOUR DOCTOR. 270 tablet 0  . glucose blood test strip Check blood sugar 1 time daily-DX-E11.22 100 each 4  . hydrocortisone (ANUSOL-HC) 25 MG suppository Place 25 mg rectally 2 (two) times daily as  needed for hemorrhoids or itching.    Marland Kitchen JARDIANCE 25 MG TABS tablet TAKE 1 TABLET BY MOUTH  DAILY 90 tablet 1  . Magnesium 250 MG TABS Take 250 mg by mouth daily.    . megestrol (MEGACE) 20 MG tablet Take 20 mg by mouth 2 (two) times daily.    . metFORMIN (GLUCOPHAGE-XR) 500 MG 24 hr tablet TAKE 1 TO 2 TABLETS BY  MOUTH TWO TIMES DAILY AS  DIRECTED FOR DIABETES (Patient taking differently: TAKE 1 TABLET (500 MG) BY MOUTH THREE TIMES DAILY OR AS DIRECTED FOR DIABETES) 360 tablet 0  . metoprolol succinate (TOPROL-XL) 25 MG 24 hr tablet TAKE ONE-HALF TABLET BY  MOUTH DAILY. TAKE WITH OR  IMMEDIATELY FOLLOWING A  MEAL. 45 tablet 2  . montelukast (SINGULAIR)  10 MG tablet Take 10 mg by mouth at bedtime.     . Multiple Vitamin (MULTIVITAMIN WITH MINERALS) TABS tablet Take 1 tablet by mouth daily.    . nitroGLYCERIN (NITROSTAT) 0.4 MG SL tablet PLACE ONE TABLET UNDER THE TONGUE EVERY 5 MINUTES AS NEEDED FOR CHEST PAIN. 25 tablet 6  . Omega-3 Fatty Acids (FISH OIL) 1000 MG CAPS Take 1,000 mg by mouth daily.     . ondansetron (ZOFRAN ODT) 4 MG disintegrating tablet Take 1 tablet (4 mg total) by mouth every 8 (eight) hours as needed for nausea or vomiting. 20 tablet 0  . OVER THE COUNTER MEDICATION Patient takes Novolin 70/30 mix 10 units in the AM at breakfast and 10 units in the PM at supper.  He buys the Novolin 70/30 at [employer].    . pantoprazole (PROTONIX) 40 MG tablet TAKE 1 TABLET BY MOUTH  DAILY 90 tablet 1  . tamsulosin (FLOMAX) 0.4 MG CAPS capsule Take 0.4 mg by mouth at bedtime.     No current facility-administered medications on file prior to visit.     Allergies: No Active Allergies  Current Problems (verified) has Hypertension (1991); T2_NIDDM w/ CKD2 (GFR 64 ml/min); GERD; ASCAD s/p PTCA (06/2012); Mixed hyperlipidemia; Vitamin D deficiency; Medication management; Diabetic peripheral neuropathy ([place]); Malignant neoplasm of prostate ([name]); Thyroiditis; and DKA (diabetic ketoacidoses) ([place])  on his problem list.  Screening Tests Immunization History  Administered Date(s) Administered  . Influenza Split 06/26/2012  . Influenza, High Dose Seasonal PF 06/20/2015, 06/23/2016  . Influenza-Unspecified 06/14/2013, 06/25/2014  . Pneumococcal Conjugate-13 04/23/2016  . Pneumococcal-Unspecified 06/16/2010  . Td 09/17/2003  . Tdap 11/26/2013  . Zoster 07/12/2012    Preventative care: Last colonoscopy: 2011 Echo 3013 Stress test 2017 Bone scan 04/2016 Thyroid 2017 Ct head 2018  Prior vaccinations: TD or Tdap: 2015  Influenza: 2016  Pneumococcal: 2011 declines Prevnar13: 2017 Shingles/Zostavax: 2013  Names of Other Physician/Practitioners you currently use: 1. [facility] here for primary care 2. Dr. Gershon Crane, eye doctor, last visit August 2018 3. Dr. Gerlene Burdock, dentist, last visit 2017 Patient Care Team: [name], MD as PCP - General (Internal Medicine)  Surgical: He  has a past surgical history that includes Appendectomy; Cardiac surgery; left heart catheterization with coronary angiogram (N/A, 06/27/2012); left heart catheterization with coronary angiogram (N/A, 07/25/2012); Colonoscopy; Radiology with anesthesia (N/A, 12/03/2015); and Prostate biopsy. Family His family history includes Diabetes in his sister. Social history  He reports that he quit smoking about 5 years ago. He quit after 50.00 years of use. He has never used smokeless tobacco. He reports that he drinks alcohol. He reports that he does not use drugs.  MEDICARE WELLNESS OBJECTIVES: Physical activity: Current Exercise Habits: Home exercise routine, Type of exercise: walking, Time (Minutes): 20, Frequency (Times/Week): 2, Weekly Exercise (Minutes/Week): 40, Intensity: Mild Cardiac risk factors: Cardiac Risk Factors include: advanced age (>76mn, >>58women);diabetes mellitus;dyslipidemia;family history of premature cardiovascular disease;hypertension;male  gender;sedentary lifestyle Depression/mood screen:   [facility] 05/03/2017  Decreased Interest 0  Down, Depressed, Hopeless 0  PHQ - 2 Score 0    ADLs:  In your present state of health, do you have any difficulty performing the following activities: 02/06/2017 01/31/2017  Hearing? N N  Vision? N N  Difficulty concentrating or making decisions? N N  Walking or climbing stairs? N Y  Dressing or bathing? N Y  Doing errands, shopping? N Y  Some recent data might be hidden  Cognitive Testing  Alert? Yes  Normal Appearance?Yes  Oriented to person? Yes  Place? Yes   Time? Yes  Recall of three objects?  Yes  Can perform simple calculations? Yes  Displays appropriate judgment?Yes  Can read the correct time from a watch face?Yes  EOL planning: Does Patient Have a Medical Advance Directive?: Yes Type of Advance Directive: Healthcare Power of Attorney, Living will Copy of Leisure Michael in Chart?: No - copy requested   Objective:   Today's Vitals   05/03/17 1102  BP: 124/80  Pulse: 88  Resp: 14  Temp: (!) 97.2 F (36.2 C)  SpO2: 98%  Weight: 193 lb 6.4 oz (87.7 kg)  Height: 5' 9.5" (1.765 m)  PainSc: 0-No pain   Body mass index is 28.15 kg/m.  General appearance: alert, no distress, WD/WN, male HEENT: normocephalic, sclerae anicteric, TMs pearly, nares patent, no discharge or erythema, pharynx normal Oral cavity: MMM, no lesions Neck: supple, no lymphadenopathy, no thyromegaly, no masses Heart: RRR, normal S1, S2, no murmurs Lungs: CTA bilaterally, no wheezes, rhonchi, or rales Abdomen: +bs, soft, non tender, non distended, no masses, no hepatomegaly, no splenomegaly Musculoskeletal: nontender, no swelling, no obvious deformity Extremities: no edema, no cyanosis, no clubbing Pulses: 2+ symmetric, upper and lower extremities, normal cap refill Neurological: alert, oriented x 3, CN2-12 intact, strength normal upper extremities and lower  extremities, sensation decreased bilateral feet, DTRs 2+ throughout, no cerebellar signs, gait normal Psychiatric: normal affect, behavior normal, pleasant   Medicare Attestation I have personally reviewed: The patient's medical and social history Their use of alcohol, tobacco or illicit drugs Their current medications and supplements The patient's functional ability including ADLs,fall risks, home safety risks, cognitive, and hearing and visual impairment Diet and physical activities Evidence for depression or mood disorders  The patient's weight, height, BMI, and visual acuity have been recorded in the chart.  I have made referrals, counseling, and provided education to the patient based on review of the above and I have provided the patient with a written personalized care plan for preventive services.     Vicie Mutters, PA-C   05/03/2017

## 2017-05-03 ENCOUNTER — Other Ambulatory Visit: Payer: Self-pay | Admitting: Physician Assistant

## 2017-05-03 ENCOUNTER — Ambulatory Visit (INDEPENDENT_AMBULATORY_CARE_PROVIDER_SITE_OTHER): Payer: Medicare Other | Admitting: Physician Assistant

## 2017-05-03 ENCOUNTER — Encounter: Payer: Self-pay | Admitting: Physician Assistant

## 2017-05-03 VITALS — BP 124/80 | HR 88 | Temp 97.2°F | Resp 14 | Ht 69.5 in | Wt 193.4 lb

## 2017-05-03 DIAGNOSIS — E782 Mixed hyperlipidemia: Secondary | ICD-10-CM | POA: Diagnosis not present

## 2017-05-03 DIAGNOSIS — E1122 Type 2 diabetes mellitus with diabetic chronic kidney disease: Secondary | ICD-10-CM | POA: Diagnosis not present

## 2017-05-03 DIAGNOSIS — Z7189 Other specified counseling: Secondary | ICD-10-CM

## 2017-05-03 DIAGNOSIS — N182 Chronic kidney disease, stage 2 (mild): Principal | ICD-10-CM

## 2017-05-03 DIAGNOSIS — I10 Essential (primary) hypertension: Secondary | ICD-10-CM | POA: Diagnosis not present

## 2017-05-03 DIAGNOSIS — I771 Stricture of artery: Secondary | ICD-10-CM | POA: Diagnosis not present

## 2017-05-03 DIAGNOSIS — I700 Atherosclerosis of aorta: Secondary | ICD-10-CM

## 2017-05-03 DIAGNOSIS — C61 Malignant neoplasm of prostate: Secondary | ICD-10-CM

## 2017-05-03 DIAGNOSIS — Z9861 Coronary angioplasty status: Secondary | ICD-10-CM

## 2017-05-03 DIAGNOSIS — E069 Thyroiditis, unspecified: Secondary | ICD-10-CM

## 2017-05-03 DIAGNOSIS — Z0000 Encounter for general adult medical examination without abnormal findings: Secondary | ICD-10-CM

## 2017-05-03 DIAGNOSIS — K219 Gastro-esophageal reflux disease without esophagitis: Secondary | ICD-10-CM

## 2017-05-03 DIAGNOSIS — Z794 Long term (current) use of insulin: Secondary | ICD-10-CM

## 2017-05-03 DIAGNOSIS — Z79899 Other long term (current) drug therapy: Secondary | ICD-10-CM | POA: Diagnosis not present

## 2017-05-03 DIAGNOSIS — I2510 Atherosclerotic heart disease of native coronary artery without angina pectoris: Secondary | ICD-10-CM

## 2017-05-03 DIAGNOSIS — E1142 Type 2 diabetes mellitus with diabetic polyneuropathy: Secondary | ICD-10-CM | POA: Diagnosis not present

## 2017-05-03 MED ORDER — INSULIN NPH ISOPHANE & REGULAR (70-30) 100 UNIT/ML ~~LOC~~ SUSP: SUBCUTANEOUS | 4 refills | Status: DC

## 2017-05-03 MED ORDER — GLUCOSE BLOOD VI STRP: ORAL_STRIP | 4 refills | Status: DC

## 2017-05-03 NOTE — Patient Instructions (Addendum)
Check your sugar twice a day Will set up strips where you can Will give you a log and keep your sugars twice daily [place] TOO   Stay on the metformin and the insulin 70/30 Stop the jardiance and stop the glipizide Will send to DM educator  Your insulin will force your sugar down no matter where it is starting.  Please never take this medication if you are sick or can not eat. A low blood sugar is much more dangerous than a high blood sugar. Your brain needs two things, sugar and oxygen.    Somogyi effect The brain needs two things: oxygen and sugar. If the blood sugar level drops too low in the early morning hours, hormones (such as growth hormone, cortisol, and catecholamines) are released to make sure you brain can still function. These help reverse the low blood sugar level but may lead to blood sugar levels that are higher than normal in the morning. This is common for patient that take insulin at night or do not ear regular snacks.  Please schedule to get up in the middle of the night to check your blood sugar.   This may be happening to you. Please eat a high protein night time snack, just a snack Low fat yogurt Low fat cheese stick Meat   Your A1C is a measure of your sugar over the past 3 months and is not affected by what you have eaten over the past few days. Diabetes increases your chances of stroke and heart attack over 300 % and is the leading cause of blindness and kidney failure in the [place]. Please make sure you decrease bad carbs like white bread, white rice, potatoes, corn, soft drinks, pasta, cereals, refined sugars, sweet tea, dried fruits, and fruit juice. Good carbs are okay to eat in moderation like sweet potatoes, brown rice, whole grain pasta/bread, most fruit (except dried fruit) and you can eat as many veggies as you want.   Greater than 6.5 is considered diabetic. Between 6.4 and 5.7 is prediabetic If your A1C is less than 5.7  you are NOT diabetic.  Targets for Glucose Readings: Time of Check Target for patients WITHOUT Diabetes Target for DIABETICS  Before Meals Less than 100  less than 150  Two hours after meals Less than 200  Less than 250       Bad carbs also include fruit juice, alcohol, and sweet tea. These are empty calories that do not signal to your brain that you are full.   Please remember the good carbs are still carbs which convert into sugar. So please measure them out no more than 1/2-1 cup of rice, oatmeal, pasta, and beans  Veggies are however free foods! Pile them on.   Not all fruit is created equal. Please see the list below, the fruit at the bottom is higher in sugars than the fruit at the top. Please avoid all dried fruits.    Your ears and sinuses are connected by the eustachian tube. When your sinuses are inflamed, this can close off the tube and cause fluid to collect in your middle ear. This can then cause dizziness, popping, clicking, ringing, and echoing in your ears. This is often NOT an infection and does NOT require antibiotics, it is caused by inflammation so the treatments help the inflammation. This can take a long time to get better so please be patient.  Here are things you can do to help  with this: - Try the Flonase or Nasonex. Remember to spray each nostril twice towards the outer part of your eye.  Do not sniff but instead pinch your nose and tilt your head back to help the medicine get into your sinuses.  The best time to do this is at bedtime.Stop if you get blurred vision or nose bleeds.  -While drinking fluids, pinch and hold nose close and swallow, to help open eustachian tubes to drain fluid behind ear drums. -Please pick one of the over the counter allergy medications below and take it once daily for allergies.  It will also help with fluid behind ear drums. Claritin or loratadine cheapest but likely the weakest  Zyrtec or certizine at night because it can make you  sleepy The strongest is allegra or fexafinadine  Cheapest at [facility], [employer], [employer] -can use decongestant over the counter, please do not use if you have high blood pressure or certain heart conditions.   if worsening HA, changes vision/speech, imbalance, weakness go to the ER

## 2017-05-04 LAB — HEPATIC FUNCTION PANEL
AG RATIO: 2.2 (calc) (ref 1.0–2.5)
ALBUMIN MSPROF: 4.6 g/dL (ref 3.6–5.1)
ALT: 18 U/L (ref 9–46)
AST: 14 U/L (ref 10–35)
Alkaline phosphatase (APISO): 57 U/L (ref 40–115)
BILIRUBIN DIRECT: 0.2 mg/dL (ref 0.0–0.2)
GLOBULIN: 2.1 g/dL (ref 1.9–3.7)
Indirect Bilirubin: 0.4 mg/dL (calc) (ref 0.2–1.2)
Total Bilirubin: 0.6 mg/dL (ref 0.2–1.2)
Total Protein: 6.7 g/dL (ref 6.1–8.1)

## 2017-05-04 LAB — BASIC METABOLIC PANEL WITH GFR
BUN: 15 mg/dL (ref 7–25)
CALCIUM: 9.7 mg/dL (ref 8.6–10.3)
CO2: 21 mmol/L (ref 20–32)
CREATININE: 1.04 mg/dL (ref 0.70–1.25)
Chloride: 108 mmol/L (ref 98–110)
GFR, EST AFRICAN AMERICAN: 85 mL/min/{1.73_m2} (ref >=60)
GFR, EST NON AFRICAN AMERICAN: 73 mL/min/{1.73_m2} (ref >=60)
Glucose, Bld: 280 mg/dL — ABNORMAL HIGH (ref 65–99)
Potassium: 3.8 mmol/L (ref 3.5–5.3)
Sodium: 140 mmol/L (ref 135–146)

## 2017-05-04 LAB — CBC WITH DIFFERENTIAL/PLATELET
BASOS PCT: 0.6 %
Basophils Absolute: 30 cells/uL (ref 0–200)
EOS ABS: 130 {cells}/uL (ref 15–500)
Eosinophils Relative: 2.6 %
HEMATOCRIT: 38.8 % (ref 38.5–50.0)
HEMOGLOBIN: 12.8 g/dL — AB (ref 13.2–17.1)
Lymphs Abs: 560 cells/uL — ABNORMAL LOW (ref 850–3900)
MCH: 30.5 pg (ref 27.0–33.0)
MCHC: 33 g/dL (ref 32.0–36.0)
MCV: 92.4 fL (ref 80.0–100.0)
MPV: 12.2 fL (ref 7.5–12.5)
Monocytes Relative: 11 %
Neutro Abs: 3730 cells/uL (ref 1500–7800)
Neutrophils Relative %: 74.6 %
Platelets: 200 10*3/uL (ref 140–400)
RBC: 4.2 10*6/uL (ref 4.20–5.80)
RDW: 13.4 % (ref 11.0–15.0)
Total Lymphocyte: 11.2 %
WBC: 5 10*3/uL (ref 3.8–10.8)
WBCMIX: 550 {cells}/uL (ref 200–950)

## 2017-05-04 LAB — LIPID PANEL
CHOL/HDL RATIO: 4 (calc) (ref <5.0)
Cholesterol: 135 mg/dL (ref <200)
HDL: 34 mg/dL — ABNORMAL LOW (ref >40)
LDL CHOLESTEROL (CALC): 65 mg/dL
NON-HDL CHOLESTEROL (CALC): 101 mg/dL (ref <130)
Triglycerides: 299 mg/dL — ABNORMAL HIGH (ref <150)

## 2017-05-04 LAB — TSH: TSH: 0.5 mIU/L (ref 0.40–4.50)

## 2017-05-04 LAB — HEMOGLOBIN A1C
HEMOGLOBIN A1C: 10 %{Hb} — AB (ref <5.7)
Mean Plasma Glucose: 240 (calc)
eAG (mmol/L): 13.3 (calc)

## 2017-05-04 LAB — MAGNESIUM: Magnesium: 2.1 mg/dL (ref 1.5–2.5)

## 2017-05-12 ENCOUNTER — Other Ambulatory Visit: Payer: Self-pay

## 2017-05-12 MED ORDER — GLUCOSE BLOOD VI STRP: ORAL_STRIP | 4 refills | Status: DC

## 2017-05-13 ENCOUNTER — Ambulatory Visit: Payer: Medicare Other | Admitting: Internal Medicine

## 2017-05-27 DIAGNOSIS — M1611 Unilateral primary osteoarthritis, right hip: Secondary | ICD-10-CM | POA: Diagnosis not present

## 2017-05-28 ENCOUNTER — Other Ambulatory Visit: Payer: Self-pay | Admitting: Physician Assistant

## 2017-06-02 NOTE — Progress Notes (Signed)
Diabetes Education and Follow-Up Visit  71 y.o.male presents for diabetic education. He has Diabetes Mellitus type 2:  with CKD stage 2 GFR 60-89, CAD, Neuropathy, DKA.  Patient denies none. The patient is on a low dose aspirin at this time.   Last hemoglobin A1c was: Lab Results  Component Value Date   HGBA1C 10.0 (H) 05/03/2017   HGBA1C 10.5 (H) 01/15/2017   HGBA1C 7.4 (H) 12/03/2016    Pt is on a regimen of: He is on 25 of 70/30 in the morning and 20 in the morning On Metformin and off of glipizide  Pt checks his sugars 2 x day  Lowest sugar was 120  He has hypoglycemia awareness? Highest sugar was 440 Glucometer: one touch  Exercise: walking 1/4 of a mile, trying to walk twice a week, if he walks longer he is tired. Normal stress test 04/2016  Patient does have CKD He is on ACE/ARB  Lab Results  Component Value Date   GFRAA 85 05/03/2017    Lab Results  Component Value Date   CREATININE 1.04 05/03/2017   BUN 15 05/03/2017   NA 140 05/03/2017   K 3.8 05/03/2017   CL 108 05/03/2017   CO2 21 05/03/2017    Lab Results  Component Value Date   MICROALBUR 0.9 01/15/2017     He is on a Statin.  Lab Results  Component Value Date   CHOL 135 05/03/2017   HDL 34 (L) 05/03/2017   LDLCALC 68 01/15/2017   LDLDIRECT 45.7 10/30/2013   TRIG 299 (H) 05/03/2017   CHOLHDL 4.0 05/03/2017   Problem List has Hypertension (1991); T2_NIDDM w/ CKD2 (GFR 64 ml/min); GERD; ASCAD s/p PTCA (06/2012); Mixed hyperlipidemia; Vitamin D deficiency; Medication management; Diabetic peripheral neuropathy ([place]); Malignant neoplasm of prostate ([place]); Thyroiditis; and DKA (diabetic ketoacidoses) ([place]) on his problem list.  Medications Current Outpatient Prescriptions on File Prior to Visit  Medication Sig  . aspirin EC 81 MG tablet Take 81 mg by mouth daily.  Marland Kitchen atorvastatin (LIPITOR) 20 MG tablet TAKE 1 TABLET BY MOUTH  DAILY  . Blood Glucose Monitoring Suppl (ONE TOUCH ULTRA SYSTEM KIT)  w/Device KIT Check blood sugar 1 time daily-DX-E11.22  . Cholecalciferol (VITAMIN D3) 5000 UNITS CAPS Take 5,000 Units by mouth 2 (two) times daily.  . fluticasone (FLONASE) 50 MCG/ACT nasal spray Place 2 sprays into both nostrils daily.  Marland Kitchen gabapentin (NEURONTIN) 600 MG tablet TAKE 1 TABLET BY MOUTH 3  TIMES A DAY AS NEEDED FOR  PAIN  . glipiZIDE (GLUCOTROL) 5 MG tablet TAKE 1 TABLET BY MOUTH 3  TIMES DAILY FOR GLUCOSE  OVER 200 OR AS DIRECTED BY  YOUR DOCTOR.  Marland Kitchen glucose blood test strip Check blood sugar 2 times daily-DX-E11.22, patient is on insulin  . hydrocortisone (ANUSOL-HC) 25 MG suppository Place 25 mg rectally 2 (two) times daily as needed for hemorrhoids or itching.  . insulin NPH-regular Human (NOVOLIN 70/30) (70-30) 100 UNIT/ML injection Inject 25 units in the AM [place] with food, and inject 20 units [place] with evening meal or as directed  . Magnesium 250 MG TABS Take 250 mg by mouth daily.  . megestrol (MEGACE) 20 MG tablet Take 20 mg by mouth 2 (two) times daily.  . metFORMIN (GLUCOPHAGE-XR) 500 MG 24 hr tablet TAKE 1 TO 2 TABLETS BY  MOUTH TWO TIMES DAILY AS  DIRECTED FOR DIABETES (Patient taking differently: TAKE 1 TABLET (500 MG) BY MOUTH THREE TIMES DAILY OR AS DIRECTED FOR DIABETES)  .  metoprolol succinate (TOPROL-XL) 25 MG 24 hr tablet TAKE ONE-HALF TABLET BY  MOUTH DAILY. TAKE WITH OR  IMMEDIATELY FOLLOWING A  MEAL.  . montelukast (SINGULAIR) 10 MG tablet Take 10 mg by mouth at bedtime.   . Multiple Vitamin (MULTIVITAMIN WITH MINERALS) TABS tablet Take 1 tablet by mouth daily.  . nitroGLYCERIN (NITROSTAT) 0.4 MG SL tablet PLACE ONE TABLET UNDER THE TONGUE EVERY 5 MINUTES AS NEEDED FOR CHEST PAIN.  Marland Kitchen Omega-3 Fatty Acids (FISH OIL) 1000 MG CAPS Take 1,000 mg by mouth daily.   . ondansetron (ZOFRAN ODT) 4 MG disintegrating tablet Take 1 tablet (4 mg total) by mouth every 8 (eight) hours as needed for nausea or vomiting.  Marland Kitchen OVER THE COUNTER MEDICATION Patient takes Novolin 70/30 mix 10  units in the AM at breakfast and 10 units in the PM at supper.  He buys the Novolin 70/30 at [employer].  . pantoprazole (PROTONIX) 40 MG tablet TAKE 1 TABLET BY MOUTH  DAILY  . tamsulosin (FLOMAX) 0.4 MG CAPS capsule Take 0.4 mg by mouth at bedtime.   No current facility-administered medications on file prior to visit.     Review of Systems  Constitutional: Positive for malaise/fatigue.  HENT: Negative.   Eyes: Negative.   Respiratory: Negative.   Cardiovascular: Negative.   Gastrointestinal: Negative.   Genitourinary: Negative.   Musculoskeletal: Negative.   Skin: Negative.   Neurological: Negative.   Endo/Heme/Allergies: Negative.   Psychiatric/Behavioral: Negative.     Physical Exam: Blood pressure 120/80, pulse 88, temperature 97.6 F (36.4 C), resp. rate 16, height 5' 9.5" (1.765 m), weight 195 lb 9.6 oz (88.7 kg), SpO2 99 %. Body mass index is 28.47 kg/m. General Appearance: Well nourished, in no apparent distress. Eyes: PERRLA, EOMs, conjunctiva no swelling or erythema ENT/Mouth: Ext aud canals clear, TMs without erythema, bulging. No erythema, swelling, or exudate on post pharynx.  Tonsils not swollen or erythematous. Hearing normal.  Respiratory: Respiratory effort normal, BS equal bilaterally without rales, rhonchi, wheezing or stridor.  Cardio: RRR with no MRGs. Brisk peripheral pulses without edema.  Abdomen: Soft, + BS.  Non tender, no guarding, rebound, hernias, masses. Musculoskeletal: Full ROM, 5/5 strength, normal gait.  Skin: Warm, dry without rashes, lesions, ecchymosis.  Neuro: Cranial nerves intact. Normal muscle tone, no cerebellar symptoms   Plan and Assessment: Diabetes Education: Reviewed 'ABCs' of diabetes management (respective goals in parentheses):  A1C (<7), blood pressure (<130/80), and cholesterol (LDL <70) Eye Exam yearly and Dental Exam every 6 months. Dietary recommendations Physical Activity recommendations - Strongly advised him to start  checking sugars at different times of the day - check 2 times a day, rotating checks - given sugar log and advised how to fill it and to bring it at next appt  - given foot care handout and explained the principles  - given instructions for hypoglycemia management  Insulin instructions: If your morning sugar is above 200 for 3 days, increase your morning insulin by 3 units.  If your sugar is above 350 at night for 3 days, increase your night time insulin by 2 units If you get a low sugar in the night, cut back on your insulin  Future Appointments Date Time Provider [place]  08/04/2017 10:30 AM [name], MD [facility] None  02/08/2018 10:00 AM [name], MD [facility] None

## 2017-06-03 ENCOUNTER — Ambulatory Visit (INDEPENDENT_AMBULATORY_CARE_PROVIDER_SITE_OTHER): Payer: Medicare Other | Admitting: Physician Assistant

## 2017-06-03 ENCOUNTER — Encounter: Payer: Self-pay | Admitting: Physician Assistant

## 2017-06-03 VITALS — BP 120/80 | HR 88 | Temp 97.6°F | Resp 16 | Ht 69.5 in | Wt 195.6 lb

## 2017-06-03 DIAGNOSIS — Z794 Long term (current) use of insulin: Secondary | ICD-10-CM

## 2017-06-03 DIAGNOSIS — E1122 Type 2 diabetes mellitus with diabetic chronic kidney disease: Secondary | ICD-10-CM

## 2017-06-03 DIAGNOSIS — E1010 Type 1 diabetes mellitus with ketoacidosis without coma: Secondary | ICD-10-CM

## 2017-06-03 DIAGNOSIS — E1142 Type 2 diabetes mellitus with diabetic polyneuropathy: Secondary | ICD-10-CM | POA: Diagnosis not present

## 2017-06-03 DIAGNOSIS — E782 Mixed hyperlipidemia: Secondary | ICD-10-CM | POA: Diagnosis not present

## 2017-06-03 DIAGNOSIS — N182 Chronic kidney disease, stage 2 (mild): Secondary | ICD-10-CM

## 2017-06-03 DIAGNOSIS — I10 Essential (primary) hypertension: Secondary | ICD-10-CM | POA: Diagnosis not present

## 2017-06-03 NOTE — Patient Instructions (Signed)
If your morning sugar is above 200 for 3 days, increase your morning insulin by 3 units.  If your sugar is above 350 at night for 3 days, increase your night time insulin by 2 units If you get a low sugar in the night, cut back on your insulin   Please remember only take the insulin WITH food, if you are sick or unable to eat DO NOT take your insulin. Also a low blood sugar is much more dangerous than a high blood sugar. Your brain needs 2 things, oxygen and sugar, so lets make sure it gets both. If at any time you have a question or concern, call the office or message [place] in [place].    Your A1C is a measure of your sugar over the past 3 months and is not affected by what you have eaten over the past few days. Diabetes increases your chances of stroke and heart attack over 300 % and is the leading cause of blindness and kidney failure in the [place]. Please make sure you decrease bad carbs like white bread, white rice, potatoes, corn, soft drinks, pasta, cereals, refined sugars, sweet tea, dried fruits, and fruit juice. Good carbs are okay to eat in moderation like sweet potatoes, brown rice, whole grain pasta/bread, most fruit (except dried fruit) and you can eat as many veggies as you want.   Greater than 6.5 is considered diabetic. Between 6.4 and 5.7 is prediabetic If your A1C is less than 5.7 you are NOT diabetic.  Targets for Glucose Readings: Time of Check Target for patients WITHOUT Diabetes Target for DIABETICS  Before Meals Less than 100  less than 150  Two hours after meals Less than 200  Less than 250    If your morning sugar is always below 120 but your A1C is still elevated then the issue is with your sugar spiking after meals. Try to take your blood sugar approximately 2 hours after eating, this number should be less than 200. If it is not, think about the foods that you ate and better choices you can make.    Somogyi effect The brain needs two things: oxygen and sugar. If  the blood sugar level drops too low in the early morning hours, hormones (such as growth hormone, cortisol, and catecholamines) are released to make sure you brain can still function. These help reverse the low blood sugar level but may lead to blood sugar levels that are higher than normal in the morning. This is common for patient that take insulin at night or do not ear regular snacks.  Please schedule to get up in the middle of the night to check your blood sugar.   This may be happening to you. Please eat a high protein night time snack and we will be decreasing your night time insulin as follows:      Bad carbs also include fruit juice, alcohol, and sweet tea. These are empty calories that do not signal to your brain that you are full.   Please remember the good carbs are still carbs which convert into sugar. So please measure them out no more than 1/2-1 cup of rice, oatmeal, pasta, and beans  Veggies are however free foods! Pile them on.   Not all fruit is created equal. Please see the list below, the fruit at the bottom is higher in sugars than the fruit at the top. Please avoid all dried fruits.     Recommendations For Diabetic/Prediabetic Patients:   -  Take medications as prescribed  -  Recommend Dr Fara Olden Fuhrman's book "The End of Diabetes "  And "The End of Dieting"- Can get at  [email or web address] and encourage also get the Audio CD book  - AVOID Animal products, ie. Meat - red/white, Poultry and Dairy/especially cheese - Exercise at least 5 times a week for 30 minutes or preferably daily.  - No Smoking - Drink less than 2 drinks a day.  - Monitor your feet for sores - Have yearly Eye Exams - Recommend annual Flu vaccine  - Recommend Pneumovax and Prevnar vaccines - Shingles Vaccine (Zostavax) if over 70 y.o.  Goals:   - BMI less than 24 - Fasting sugar less than 130 or less than 150 if tapering medicines to lose weight  - Systolic BP less than 117  - Diastolic BP less than  80 - Bad LDL Cholesterol less than 70 - Triglycerides less than 150

## 2017-06-10 ENCOUNTER — Encounter: Payer: Self-pay | Admitting: Physician Assistant

## 2017-06-14 ENCOUNTER — Ambulatory Visit (INDEPENDENT_AMBULATORY_CARE_PROVIDER_SITE_OTHER): Payer: Medicare Other

## 2017-06-14 DIAGNOSIS — C61 Malignant neoplasm of prostate: Secondary | ICD-10-CM | POA: Diagnosis not present

## 2017-06-14 DIAGNOSIS — Z5111 Encounter for antineoplastic chemotherapy: Secondary | ICD-10-CM | POA: Diagnosis not present

## 2017-06-14 DIAGNOSIS — Z23 Encounter for immunization: Secondary | ICD-10-CM

## 2017-06-14 NOTE — Progress Notes (Signed)
Pt presents for HD flu & Pneumococcal vaccines. Both injections were given w/o issues.

## 2017-06-16 ENCOUNTER — Other Ambulatory Visit: Payer: Self-pay | Admitting: Internal Medicine

## 2017-06-16 ENCOUNTER — Other Ambulatory Visit: Payer: Self-pay | Admitting: Physician Assistant

## 2017-06-16 DIAGNOSIS — E119 Type 2 diabetes mellitus without complications: Secondary | ICD-10-CM

## 2017-06-29 ENCOUNTER — Encounter: Payer: Medicare Other | Attending: Internal Medicine | Admitting: Skilled Nursing Facility1

## 2017-06-29 ENCOUNTER — Encounter: Payer: Self-pay | Admitting: Skilled Nursing Facility1

## 2017-06-29 DIAGNOSIS — Z794 Long term (current) use of insulin: Secondary | ICD-10-CM | POA: Diagnosis not present

## 2017-06-29 DIAGNOSIS — Z6838 Body mass index (BMI) 38.0-38.9, adult: Secondary | ICD-10-CM | POA: Diagnosis not present

## 2017-06-29 DIAGNOSIS — E1122 Type 2 diabetes mellitus with diabetic chronic kidney disease: Secondary | ICD-10-CM | POA: Diagnosis not present

## 2017-06-29 DIAGNOSIS — E1142 Type 2 diabetes mellitus with diabetic polyneuropathy: Secondary | ICD-10-CM | POA: Diagnosis not present

## 2017-06-29 DIAGNOSIS — Z713 Dietary counseling and surveillance: Secondary | ICD-10-CM | POA: Diagnosis not present

## 2017-06-29 DIAGNOSIS — N182 Chronic kidney disease, stage 2 (mild): Secondary | ICD-10-CM | POA: Insufficient documentation

## 2017-06-29 NOTE — Progress Notes (Signed)
Diabetes Self-Management Education  Visit Type: First/Initial  Appt. Start Time: 8:30 Appt. End Time: 10:10  06/29/2017  [name], identified by name and date of birth, is a 71 y.o. male with a diagnosis of Diabetes: Type 2.   ASSESSMENT  Height 5' 9.5" (1.765 m), weight 195 lb (88.5 kg). Body mass index is 28.38 kg/m.  Pt states because of the cancer he is very tired and also has arthritis in the hips so he is getting corticosteroid shots. Pt states his blood sugars stay in 350-400+. Pt states he is starving over night, urinating throughout the night, and very thirsty all the time. Pt states he has been very tired. Pt states he was referred to the endocrinologist but never went because he felt like he was fine.       Diabetes Self-Management Education - 06/29/17 0837      Visit Information   Visit Type First/Initial     Initial Visit   Diabetes Type Type 2   Are you currently following a meal plan? No   Are you taking your medications as prescribed? Yes     Health Coping   How would you rate your overall health? Fair     Psychosocial Assessment   Patient Belief/Attitude about Diabetes Motivated to manage diabetes   Patient Concerns Nutrition/Meal planning     Pre-Education Assessment   Patient understands the diabetes disease and treatment process. Needs Instruction   Patient understands incorporating nutritional management into lifestyle. Needs Instruction   Patient undertands incorporating physical activity into lifestyle. Needs Instruction   Patient understands using medications safely. Needs Instruction   Patient understands monitoring blood glucose, interpreting and using results Needs Instruction   Patient understands prevention, detection, and treatment of acute complications. Needs Instruction   Patient understands prevention, detection, and treatment of chronic complications. Needs Instruction   Patient understands how to develop strategies to  address psychosocial issues. Needs Instruction   Patient understands how to develop strategies to promote health/change behavior. Needs Instruction     Complications   Last HgB A1C per patient/outside source 10 %   How often do you check your blood sugar? 1-2 times/day   Fasting Blood glucose range (mg/dL) >200   Postprandial Blood glucose range (mg/dL) >200   Have you had a dilated eye exam in the past 12 months? Yes   Have you had a dental exam in the past 12 months? No   Are you checking your feet? Yes   How many days per week are you checking your feet? 7     Dietary Intake   Breakfast bacon and egg sandwich on whole wheat or [place] meat and egg somtimes 1 slice of bread  (6-0:73)   Snack (morning) pork skins   Lunch boiled eggs and whole wheat crackers or chicken stew or pinto beans  (11:30)   Snack (afternoon) wholw wheat cracker and boiled egg   Dinner boiled cabbage with [place] sausage  (3:30-5pm) chicken or pork chops with canned vegatbles and chicken broth with cabbage, butter beans or cabbage    Beverage(s) diet coke, water, coffee with sweet and low     Exercise   Exercise Type ADL's     Patient Education   Previous Diabetes Education No   Disease state  Factors that contribute to the development of diabetes;Explored patient's options for treatment of their diabetes   Nutrition management  Role of diet in the treatment of diabetes and the relationship between the three main  macronutrients and blood glucose level;Food label reading, portion sizes and measuring food.;Carbohydrate counting;Reviewed blood glucose goals for pre and post meals and how to evaluate the patients' food intake on their blood glucose level.   Physical activity and exercise  Role of exercise on diabetes management, blood pressure control and cardiac health.;Identified with patient nutritional and/or medication changes necessary with exercise.   Monitoring Taught/evaluated SMBG meter.;Purpose and frequency  of SMBG.;Yearly dilated eye exam;Daily foot exams   Psychosocial adjustment Worked with patient to identify barriers to care and solutions;Role of stress on diabetes   Personal strategies to promote health Lifestyle issues that need to be addressed for better diabetes care     Individualized Goals (developed by patient)   Medications take my medication as prescribed   Monitoring  test my blood glucose as discussed;test blood glucose pre and post meals as discussed   Reducing Risk do foot checks daily;increase portions of nuts and seeds     Post-Education Assessment   Patient understands the diabetes disease and treatment process. Demonstrates understanding / competency   Patient understands incorporating nutritional management into lifestyle. Demonstrates understanding / competency   Patient undertands incorporating physical activity into lifestyle. Demonstrates understanding / competency   Patient understands using medications safely. Demonstrates understanding / competency   Patient understands monitoring blood glucose, interpreting and using results Demonstrates understanding / competency   Patient understands prevention, detection, and treatment of acute complications. Demonstrates understanding / competency   Patient understands prevention, detection, and treatment of chronic complications. Demonstrates understanding / competency   Patient understands how to develop strategies to address psychosocial issues. Demonstrates understanding / competency   Patient understands how to develop strategies to promote health/change behavior. Demonstrates understanding / competency     Outcomes   Expected Outcomes Demonstrated interest in learning. Expect positive outcomes   Future DMSE PRN   Program Status Completed      Individualized Plan for Diabetes Self-Management Training:   Learning Objective:  Patient will have a greater understanding of diabetes self-management. Patient education plan  is to attend individual and/or group sessions per assessed needs and concerns.   Plan:   Patient Instructions  -Get in with the endocrinologist   -Always bring your meter with you everywhere you go -Always Properly dispose of your needles:  -Discard in a hard plastic/metal container with a lid (something the needle can't puncture)  -Write Do Not Recycle on the outside of the container  -Example: A laundry detergent bottle -Never use the same needle more than once -Eat 3 carbohydrate choices for each meal and 1 for each snack -A meal: carbohydrates, protein, vegetable -A snack: A Fruit OR Vegetable AND Protein  -Try to be more active -Always pay attention to your body keeping watchful of possible low blood sugar (below 70) or high blood sugar (above 200)  -Check your feet every day looking for anything that was not there the day before  -Start checking your blood sugar 2 hours after a meal    Expected Outcomes:  Demonstrated interest in learning. Expect positive outcomes  Education material provided: Living Well with Diabetes, My Plate and Snack sheet  If problems or questions, patient to contact team via:  Phone  Future DSME appointment: PRN

## 2017-06-29 NOTE — Patient Instructions (Addendum)
-  Get in with the endocrinologist   -Always bring your meter with you everywhere you go -Always Properly dispose of your needles:  -Discard in a hard plastic/metal container with a lid (something the needle can't puncture)  -Write Do Not Recycle on the outside of the container  -Example: A laundry detergent bottle -Never use the same needle more than once -Eat 3 carbohydrate choices for each meal and 1 for each snack -A meal: carbohydrates, protein, vegetable -A snack: A Fruit OR Vegetable AND Protein  -Try to be more active -Always pay attention to your body keeping watchful of possible low blood sugar (below 70) or high blood sugar (above 200)  -Check your feet every day looking for anything that was not there the day before  -Start checking your blood sugar 2 hours after a meal

## 2017-07-23 ENCOUNTER — Ambulatory Visit: Payer: Medicare Other | Admitting: Internal Medicine

## 2017-07-23 ENCOUNTER — Encounter: Payer: Self-pay | Admitting: Internal Medicine

## 2017-07-23 VITALS — BP 142/82 | HR 86 | Temp 98.6°F | Wt 203.1 lb

## 2017-07-23 DIAGNOSIS — E069 Thyroiditis, unspecified: Secondary | ICD-10-CM

## 2017-07-23 DIAGNOSIS — E1122 Type 2 diabetes mellitus with diabetic chronic kidney disease: Secondary | ICD-10-CM | POA: Diagnosis not present

## 2017-07-23 DIAGNOSIS — E1165 Type 2 diabetes mellitus with hyperglycemia: Secondary | ICD-10-CM

## 2017-07-23 DIAGNOSIS — E1142 Type 2 diabetes mellitus with diabetic polyneuropathy: Secondary | ICD-10-CM

## 2017-07-23 DIAGNOSIS — E119 Type 2 diabetes mellitus without complications: Secondary | ICD-10-CM

## 2017-07-23 MED ORDER — METFORMIN HCL ER 500 MG PO TB24: ORAL_TABLET | ORAL | 3 refills | Status: DC

## 2017-07-23 NOTE — Progress Notes (Signed)
Patient ID: Michael Guzman, male   DOB: Jan 26, 1946, 71 y.o.   MRN: [id number]    HPI  Michael Guzman is a 71 y.o.-year-old male, initially referred by his PCP, Dr. Melford Aase, for evaluation for low TSH and thyroiditis, returning for f/u, but he tells me he is actually here for his uncontrolled DM2. This is his first visit with me for this pb. Last OV 1 year and 3 mo ago.  DM2, dx few years ago, non-insulin-dependent, uncontrolled, with complications (+ PN, CKD).  Last hemoglobin A1c was: Lab Results  Component Value Date   HGBA1C 10.0 (H) 05/03/2017   HGBA1C 10.5 (H) 01/15/2017   HGBA1C 7.4 (H) 12/03/2016   Pt is on a regimen of: - Metformin ER 500 mg 2x a day, with meals - Novolin 70/30 27 units in am and 23 in pm - at the time of the meal He was on Glipizide >> stopped when started insulin.  Pt checks his sugars 2x a day and they are >> per log: - am: 300-547 - 2h after b'fast: n/c - before lunch: n/c - 2h after lunch: n/c - before dinner: n/c - 2h after dinner: n/c - bedtime: >300-580 - nighttime: n/c Lowest sugar was 310; ? hypoglycemia awareness. Highest sugar was up to 580  Glucometer: One Touch Ultra  He saw nutrition.Pt's meals are: - Breakfast: eggs, bacon, toast - Lunch: smallest meal - Dinner: larger meal  - + CKD, last BUN/creatinine:  Lab Results  Component Value Date   BUN 15 05/03/2017   BUN 16 02/04/2017   CREATININE 1.04 05/03/2017   CREATININE 1.11 02/04/2017   - + HL; last set of lipids: Lab Results  Component Value Date   CHOL 135 05/03/2017   HDL 34 (L) 05/03/2017   LDLCALC 68 01/15/2017   LDLDIRECT 45.7 10/30/2013   TRIG 299 (H) 05/03/2017   CHOLHDL 4.0 05/03/2017  Lipitor 20. - last eye exam was in 2018. No DR reportedly.  - + numbness and tingling in his feet. On Gabapentin 1800 mg daily + ASA 81.  ? FH of DM  - adopted.  Thyroiditis: Reviewed hx: Pt has had a low TSH for years and a new thyroid uptake and scan (03/27/2016)  returned with uniform scan and suppressed uptake: Slightly decreased 24 hour radio iodine uptake at 8.4%.  Previous Uptake and scan studies: 09/02/2102:  1.  Low normal (11%) 24 hour radioiodine uptake by the thyroid gland. Uptake on the prior studies was 31% and 27%. 2.  Stable scan with fairly homogeneous activity.  No focal hot nodules identified. 04/08/2012: The thyroid scan demonstrates fairly homogeneous uptake in the thyroid gland.  A pyramidal lobe is again demonstrated.  No hot or cold lesions are identified.  Normal uptake in the salivary glands.The 24 hour I-131 uptake was calculated at 26.6%.  Normal is 10-35%. 10/27/2011: The 24 hour radioactive iodine uptake is upper limits of normal at 31.1%.  These findings may represent early Graves' disease or subclinical hyperthyroidism. Radiotracer uptake within the pyramidal lobe is noted. 07/11/2010: 24 are uptake remains within normal range of 18.9%.  The pertechnetate scan remains normal and unchanged in appearance. 04/26/2009: 24 hour I 131 uptake = 12 % (normal 10-30%). Essentially normal thyroid imaging and 24 prior uptake.  With a slightly depressed TSH, consider subacute thyroiditis or potentially early Grave's disease.  I reviewed pt's thyroid tests - normal 3 mo ago: Lab Results  Component Value Date   TSH 0.50 05/03/2017  TSH 0.49 01/15/2017   TSH 0.50 01/01/2017   TSH 0.39 (L) 12/03/2016   TSH 0.41 10/20/2016   TSH 0.44 06/23/2016   TSH 0.29 (L) 05/12/2016   TSH 0.39 (L) 03/10/2016   TSH 0.26 (L) 12/04/2015   TSH 0.501 09/05/2015   FREET4 0.60 05/12/2016   FREET4 1.02 06/26/2012    Pt denies: - feeling nodules in neck - hoarseness - dysphagia - choking - SOB with lying down  Pt was adopted >> unclear if FH of thyroid ds.?  FH of thyroid cancer. No h/o radiation tx to head or neck.  No seaweed or kelp. No recent contrast studies. No herbal supplements. No Biotin use. No recent steroids use.  Pt. also has a  history of Prostate CA, colon cancer, DM2, HTN, GERD.  ROS: Constitutional: no weight gain/no weight loss, + fatigue, + subjective hyperthermia, no subjective hypothermia, + nocturia Eyes: no blurry vision, no xerophthalmia ENT: no sore throat, + see HPI Cardiovascular: no CP/+ SOB/no palpitations/no leg swelling Respiratory: no cough/+ SOB/no wheezing Gastrointestinal: no N/no V/no D/no C/no acid reflux Musculoskeletal: no muscle aches/+ joint aches Skin: no rashes, no hair loss Neurological: no tremors/no numbness/no tingling/no dizziness  I reviewed pt's medications, allergies, PMH, social hx, family hx, and changes were documented in the history of present illness. Otherwise, unchanged from my initial visit note.  Past Medical History:  Diagnosis Date  . Arthritis   . CAD (coronary artery disease)   . Chronic back pain   . Chronically dry eyes   . Diabetes mellitus without complication ([place])   . Elevated PSA    being monitored  . GERD (gastroesophageal reflux disease)   . Hypertension   . Prostate cancer [facility])    Past Surgical History:  Procedure Laterality Date  . APPENDECTOMY    . CARDIAC SURGERY     3 stent placed.  . COLONOSCOPY    . PROSTATE BIOPSY     Social History   Social History  . Marital status: Married    Spouse name: N/A  . Number of children: 4  . Years of education: 2 y colleg   Occupational History  . 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 Smoking status: Former Smoker    Years: 50.00    Quit date: 07/27/2011  . Smokeless tobacco: Never Used  . Alcohol use Yes     Comment: occasional  . Drug use: No   Current Outpatient Medications on File Prior to Visit  Medication Sig Dispense Refill  . aspirin EC 81 MG tablet Take 81 mg by mouth daily.    Marland Kitchen atorvastatin (LIPITOR) 20 MG tablet TAKE 1 TABLET BY MOUTH  DAILY 90 tablet 2  . Blood Glucose Monitoring Suppl (ONE TOUCH ULTRA SYSTEM KIT) w/Device KIT Check blood sugar  1 time daily-DX-E11.22 1 each 0  . Cholecalciferol (VITAMIN D3) 5000 UNITS CAPS Take 5,000 Units by mouth 2 (two) times daily.    . fluticasone (FLONASE) 50 MCG/ACT nasal spray Place 2 sprays into both nostrils daily. 16 g 0  . gabapentin (NEURONTIN) 600 MG tablet TAKE 1 TABLET BY MOUTH 3  TIMES A DAY AS NEEDED FOR  PAIN 270 tablet 1  . glucose blood test strip Check blood sugar 2 times daily-DX-E11.22, patient is on insulin 100 each 4  . hydrocortisone (ANUSOL-HC) 25 MG suppository Place 25 mg rectally 2 (two) times daily as needed for hemorrhoids or itching.    . insulin NPH-regular Human (  NOVOLIN 70/30) (70-30) 100 UNIT/ML injection Inject 25 units in the AM [place] with food, and inject 20 units [place] with evening meal or as directed 10 mL 4  . Magnesium 250 MG TABS Take 250 mg by mouth daily.    . megestrol (MEGACE) 20 MG tablet Take 20 mg by mouth 2 (two) times daily.    . metFORMIN (GLUCOPHAGE-XR) 500 MG 24 hr tablet TAKE 1 TO 2 TABLETS BY  MOUTH TWO TIMES DAILY AS  DIRECTED FOR DIABETES 360 tablet 0  . metoprolol succinate (TOPROL-XL) 25 MG 24 hr tablet TAKE ONE-HALF TABLET BY  MOUTH DAILY. TAKE WITH OR  IMMEDIATELY FOLLOWING A  MEAL. 45 tablet 2  . montelukast (SINGULAIR) 10 MG tablet TAKE 1 TABLET BY MOUTH AT  BEDTIME 90 tablet 0  . Multiple Vitamin (MULTIVITAMIN WITH MINERALS) TABS tablet Take 1 tablet by mouth daily.    . nitroGLYCERIN (NITROSTAT) 0.4 MG SL tablet PLACE ONE TABLET UNDER THE TONGUE EVERY 5 MINUTES AS NEEDED FOR CHEST PAIN. 25 tablet 6  . Omega-3 Fatty Acids (FISH OIL) 1000 MG CAPS Take 1,000 mg by mouth daily.     . ondansetron (ZOFRAN ODT) 4 MG disintegrating tablet Take 1 tablet (4 mg total) by mouth every 8 (eight) hours as needed for nausea or vomiting. 20 tablet 0  . OVER THE COUNTER MEDICATION Patient takes Novolin 70/30 mix 10 units in the AM at breakfast and 10 units in the PM at supper.  He buys the Novolin 70/30 at [employer].    . pantoprazole (PROTONIX) 40 MG tablet TAKE  1 TABLET BY MOUTH  DAILY 90 tablet 1  . tamsulosin (FLOMAX) 0.4 MG CAPS capsule Take 0.4 mg by mouth at bedtime.    Marland Kitchen glipiZIDE (GLUCOTROL) 5 MG tablet TAKE 1 TABLET BY MOUTH 3  TIMES DAILY FOR GLUCOSE  OVER 200 OR AS DIRECTED BY  YOUR DOCTOR. (Patient not taking: Reported on 07/23/2017) 270 tablet 1   No current facility-administered medications on file prior to visit.    No Known Allergies Family History  Problem Relation Age of Onset  . Diabetes Sister    PE: BP (!) 142/82   Pulse 86   Temp 98.6 F (37 C) (Oral)   Wt 203 lb 2 oz (92.1 kg)   SpO2 97%   BMI 29.57 kg/m  Wt Readings from Last 3 Encounters:  07/23/17 203 lb 2 oz (92.1 kg)  06/29/17 195 lb (88.5 kg)  06/03/17 195 lb 9.6 oz (88.7 kg)   Constitutional: overweight, in NAD Eyes: PERRLA, EOMI, no exophthalmos ENT: moist mucous membranes, no thyromegaly, no cervical lymphadenopathy Cardiovascular: RRR, No MRG Respiratory: CTA B Gastrointestinal: abdomen soft, NT, ND, BS+ Musculoskeletal: no deformities, strength intact in all 4 Skin: moist, warm, no rashes Neurological: no tremor with outstretched hands, DTR normal in all 4  ASSESSMENT: 1. Low TSH 2/2 Thyroiditis  2. DM2  PLAN:  1. Patient with a low TSH, with initial thyrotoxic sxs: weight loss - in 2010, then with resolution of his sxs. His TSH has fluctuated in the slightly low range since ~2010. He has had several uptake and scan tests over the years with diverse results: c/w normal, Graves ds., thyroiditis (?). Latest uptake and scan was performed 2 months after a CT scan with contrast, which could have influence the results >> decreased  iodine uptake/homogeneous scan - TFTs remained normal, including the last set, 3 mo ago - will continue to follow  2. DM2 - new pb  for me - his sugars are extremely high: 300-HI on his current premixed regimen and half-max metformin dose. - we discussed about increasing Metformin dose (his kidney fxn allows it) and  switching to a basal-bolus regimen, with N and R insuli, and will increase his Total insulin dose (see below) - discussed about how to mix the N and R insulins - I suggested to:  Patient Instructions   Please stop the 70/30 insulin and start the following:  Insulin Before breakfast Before lunch Before dinner  Regular 15 10  20   NPH 30  20  Please inject the insulin 30 min before meals.  Please increase: - Metformin ER to 1000 mg 2x a day with meals  Please return in 1.5 months with your sugar log.   - check sugars at different times of the day - check 2 times a day, rotating checks - given sugar log and advised how to fill it and to bring it at next appt  - given foot care handout and explained the principles  - given instructions for hypoglycemia management "15-15 rule"  - advised for yearly eye exams  - Return to clinic in 1.5 mo with sugar log   - time spent with the patient: 40 min, of which >50% was spent in obtaining information about his diabetes, reviewing his previous labs, evaluations, and treatments, counseling him about his 2 conditions (please see the discussed topics above), and developing a plan to further treat his DM.  Philemon Kingdom, MD PhD [facility] Endocrinology

## 2017-07-23 NOTE — Patient Instructions (Addendum)
Please stop the 70/30 insulin and start the following:  Insulin Before breakfast Before lunch Before dinner  Regular 15 10  20   NPH 30  20  Please inject the insulin 30 min before meals.  Please increase: - Metformin ER to 1000 mg 2x a day with meals  Please return in 1.5 months with your sugar log.

## 2017-08-04 ENCOUNTER — Encounter: Payer: Self-pay | Admitting: Internal Medicine

## 2017-08-04 ENCOUNTER — Ambulatory Visit (INDEPENDENT_AMBULATORY_CARE_PROVIDER_SITE_OTHER): Payer: Medicare Other | Admitting: Internal Medicine

## 2017-08-04 ENCOUNTER — Telehealth: Payer: Self-pay | Admitting: Internal Medicine

## 2017-08-04 VITALS — BP 132/80 | HR 80 | Temp 97.8°F | Resp 18 | Ht 69.5 in | Wt 208.4 lb

## 2017-08-04 DIAGNOSIS — I10 Essential (primary) hypertension: Secondary | ICD-10-CM | POA: Diagnosis not present

## 2017-08-04 DIAGNOSIS — E1122 Type 2 diabetes mellitus with diabetic chronic kidney disease: Secondary | ICD-10-CM

## 2017-08-04 DIAGNOSIS — Z794 Long term (current) use of insulin: Secondary | ICD-10-CM

## 2017-08-04 DIAGNOSIS — E559 Vitamin D deficiency, unspecified: Secondary | ICD-10-CM | POA: Diagnosis not present

## 2017-08-04 DIAGNOSIS — N182 Chronic kidney disease, stage 2 (mild): Secondary | ICD-10-CM | POA: Diagnosis not present

## 2017-08-04 DIAGNOSIS — E782 Mixed hyperlipidemia: Secondary | ICD-10-CM | POA: Diagnosis not present

## 2017-08-04 DIAGNOSIS — Z79899 Other long term (current) drug therapy: Secondary | ICD-10-CM | POA: Diagnosis not present

## 2017-08-04 NOTE — Telephone Encounter (Signed)
Please advise. Thank you

## 2017-08-04 NOTE — Progress Notes (Signed)
This very nice 71 y.o. MBM presents for 6 month follow up with Hypertension, Hyperlipidemia, Insulin requiring T2_DM and Vitamin D Deficiency. 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       Patient was Dx'd Aug 2017 with Prostate Ca and treated with ext XRT thru Feb 8. Also, he's received Lupron injections 06/05/18 and 12/09/16.      Patient is treated for HTN (1991) with ASCAD and Stent in 2013& BP has been controlled at home. Today's BP is at goal -  132/80. Patient has had no complaints of any cardiac type chest pain, palpitations, dyspnea / orthopnea / PND, dizziness, claudication, or dependent edema.     Hyperlipidemia is controlled with diet & meds. Patient denies myalgias or other med SE's. Last Lipids were at goal albeit elevated Trig's: Lab Results  Component Value Date   CHOL 135 05/03/2017   HDL 34 (L) 05/03/2017   LDLCALC 68 01/15/2017   LDLDIRECT 45.7 10/30/2013   TRIG 299 (H) 05/03/2017   CHOLHDL 4.0 05/03/2017      Also, the patient has history of T2_NIDDM in 1998 and was hospitalized in May 2016 w/DKA and transitioned to Insulin.  His diabetes has been very poorly controlled. He has also been seen recently by [name] for guidance in mgmt of his Insulin mgmt. He was advised switching his bid  Novolin 70/30 mix  To bid NPH wit TID mealtime SS insulin to better accommodate his uncontrolled Diabetes.  Patient expresses frustration in his inability to comply with a TID SS regimen due to his schedule. He denies symptoms of reactive hypoglycemia, diabetic polys, paresthesias or visual blurring.  Last A1c was not at goal: Lab Results  Component Value Date   HGBA1C 10.0 (H) 05/03/2017      Further, the patient also has history of Vitamin D Deficiency and supplements vitamin D without any suspected side-effects. Last vitamin D was at goal: Lab  Results  Component Value Date   VD25OH 68 01/15/2017   Current Outpatient Medications on File Prior to Visit  Medication Sig  . aspirin EC 81 MG tablet Take 81 mg by mouth daily.  Marland Kitchen atorvastatin (LIPITOR) 20 MG tablet TAKE 1 TABLET BY MOUTH  DAILY  . Blood Glucose Monitoring Suppl (ONE TOUCH ULTRA SYSTEM KIT) w/Device KIT Check blood sugar 1 time daily-DX-E11.22  . Cholecalciferol (VITAMIN D3) 5000 UNITS CAPS Take 5,000 Units by mouth 2 (two) times daily.  . fluticasone (FLONASE) 50 MCG/ACT nasal spray Place 2 sprays into both nostrils daily.  Marland Kitchen gabapentin (NEURONTIN) 600 MG tablet TAKE 1 TABLET BY MOUTH 3  TIMES A DAY AS NEEDED FOR  PAIN  . glucose blood test strip Check blood sugar 2 times daily-DX-E11.22, patient is on insulin  . hydrocortisone (ANUSOL-HC) 25 MG suppository Place 25 mg rectally 2 (two) times daily as needed for hemorrhoids or itching.  . Magnesium 250 MG TABS Take 250 mg by mouth daily.  . megestrol (MEGACE) 20 MG tablet Take 20 mg by mouth 2 (two) times daily.  . metFORMIN (GLUCOPHAGE-XR) 500 MG 24 hr tablet TAKE 2 TABLETS BY  MOUTH TWO TIMES DAILY AS  DIRECTED FOR DIABETES  . metoprolol succinate (TOPROL-XL) 25 MG 24 hr tablet TAKE ONE-HALF TABLET BY  MOUTH DAILY. TAKE WITH OR  IMMEDIATELY FOLLOWING A  MEAL.  . montelukast (SINGULAIR) 10  MG tablet TAKE 1 TABLET BY MOUTH AT  BEDTIME  . Multiple Vitamin (MULTIVITAMIN WITH MINERALS) TABS tablet Take 1 tablet by mouth daily.  . nitroGLYCERIN (NITROSTAT) 0.4 MG SL tablet PLACE ONE TABLET UNDER THE TONGUE EVERY 5 MINUTES AS NEEDED FOR CHEST PAIN.  Marland Kitchen Omega-3 Fatty Acids (FISH OIL) 1000 MG CAPS Take 1,000 mg by mouth daily.   . ondansetron (ZOFRAN ODT) 4 MG disintegrating tablet Take 1 tablet (4 mg total) by mouth every 8 (eight) hours as needed for nausea or vomiting.  . pantoprazole (PROTONIX) 40 MG tablet TAKE 1 TABLET BY MOUTH  DAILY  . tamsulosin (FLOMAX) 0.4 MG CAPS capsule Take 0.4 mg by mouth at bedtime.   No current  facility-administered medications on file prior to visit.    No Known Allergies PMHx:   Past Medical History:  Diagnosis Date  . Arthritis   . CAD (coronary artery disease)   . Chronic back pain   . Chronically dry eyes   . Diabetes mellitus without complication ([place])   . Elevated PSA    being monitored  . GERD (gastroesophageal reflux disease)   . Hypertension   . Prostate cancer ([place])    Immunization History  Administered Date(s) Administered  . Influenza Split 06/26/2012  . Influenza, High Dose Seasonal PF 06/20/2015, 06/23/2016, 06/14/2017  . Influenza-Unspecified 06/14/2013, 06/25/2014  . Pneumococcal Conjugate-13 04/23/2016  . Pneumococcal Polysaccharide-23 06/14/2017  . Pneumococcal-Unspecified 06/16/2010  . Td 09/17/2003  . Tdap 11/26/2013  . Zoster 07/12/2012   Past Surgical History:  Procedure Laterality Date  . APPENDECTOMY    . CARDIAC SURGERY     3 stent placed.  . COLONOSCOPY    . LEFT HEART CATHETERIZATION WITH CORONARY ANGIOGRAM N/A 06/27/2012   Procedure: LEFT HEART CATHETERIZATION WITH CORONARY ANGIOGRAM;  Surgeon: Candee Furbish, MD;  [facility] CATH LAB;  Service: Cardiovascular;  Laterality: N/A;  . LEFT HEART CATHETERIZATION WITH CORONARY ANGIOGRAM N/A 07/25/2012   Procedure: LEFT HEART CATHETERIZATION WITH CORONARY ANGIOGRAM;  Surgeon: Sueanne Margarita, MD;  Location: [place] [facility] CATH LAB;  Service: Cardiovascular;  Laterality: N/A;  . PROSTATE BIOPSY    . RADIOLOGY WITH ANESTHESIA N/A 12/03/2015   Procedure: MRI LUMBAR SPINE;  Surgeon: Medication Radiologist, MD;  Location: [facility];  Service: Radiology;  Laterality: N/A;   FHx:    Reviewed / unchanged  SHx:    Reviewed / unchanged  Systems Review:  Constitutional: Denies fever, chills, wt changes, headaches, insomnia, fatigue, night sweats, change in appetite. Eyes: Denies redness, blurred vision, diplopia, discharge, itchy, watery eyes.  ENT: Denies discharge, congestion, post nasal drip, epistaxis, sore  throat, earache, hearing loss, dental pain, tinnitus, vertigo, sinus pain, snoring.  CV: Denies chest pain, palpitations, irregular heartbeat, syncope, dyspnea, diaphoresis, orthopnea, PND, claudication or edema. Respiratory: denies cough, dyspnea, DOE, pleurisy, hoarseness, laryngitis, wheezing.  Gastrointestinal: Denies dysphagia, odynophagia, heartburn, reflux, water brash, abdominal pain or cramps, nausea, vomiting, bloating, diarrhea, constipation, hematemesis, melena, hematochezia  or hemorrhoids. Genitourinary: Denies dysuria, frequency, urgency, nocturia, hesitancy, discharge, hematuria or flank pain. Musculoskeletal: Denies arthralgias, myalgias, stiffness, jt. swelling, pain, limping or strain/sprain.  Skin: Denies pruritus, rash, hives, warts, acne, eczema or change in skin lesion(s). Neuro: No weakness, tremor, incoordination, spasms, paresthesia or pain. Psychiatric: Denies confusion, memory loss or sensory loss. Endo: Denies change in weight, skin or hair change.  Heme/Lymph: No excessive bleeding, bruising or enlarged lymph nodes.  Physical Exam  BP 132/80   Pulse 80   Temp 97.8 F (36.6 C)  Resp 18   Ht 5' 9.5" (1.765 m)   Wt 208 lb 6.4 oz (94.5 kg)   BMI 30.33 kg/m   Appears over-nourished, well groomed  and in no distress.  Eyes: PERRLA, EOMs, conjunctiva no swelling or erythema. Sinuses: No frontal/maxillary tenderness ENT/Mouth: EAC's clear, TM's nl w/o erythema, bulging. Nares clear w/o erythema, swelling, exudates. Oropharynx clear without erythema or exudates. Oral hygiene is good. Tongue normal, non obstructing. Hearing intact.  Neck: Supple. Thyroid nl. Car 2+/2+ without bruits, nodes or JVD. Chest: Respirations nl with BS clear & equal w/o rales, rhonchi, wheezing or stridor.  Cor: Heart sounds normal w/ regular rate and rhythm without sig. murmurs, gallops, clicks or rubs. Peripheral pulses normal and equal  without edema.  Abdomen: Soft & bowel sounds  normal. Non-tender w/o guarding, rebound, hernias, masses or organomegaly.  Lymphatics: Unremarkable.  Musculoskeletal: Full ROM all peripheral extremities, joint stability, 5/5 strength and normal gait.  Skin: Warm, dry without exposed rashes, lesions or ecchymosis apparent.  Neuro: Cranial nerves intact, reflexes equal bilaterally. Sensory-motor testing grossly intact. Tendon reflexes grossly intact.  Pysch: Alert & oriented x 3.  Insight and judgement limited. Very sad depressed affect.   Assessment and Plan:  1. Essential hypertension  - Continue medication, monitor blood pressure at home.  - Continue DASH diet. Reminder to go to the ER if any CP,  SOB, nausea, dizziness, severe HA, changes vision/speech.  - CBC with Differential/Platelet - BASIC METABOLIC PANEL WITH GFR - Magnesium - TSH  2. Hyperlipidemia, mixed  - Continue diet/meds, exercise,& lifestyle modifications.  - Continue monitor periodic cholesterol/liver & renal functions   - Hepatic function panel - Lipid panel - TSH  3. Type 2 diabetes mellitus with stage 2 chronic kidney disease, with long-term current use of insulin (HCC)  - patient was encouraged to contact [name] to consider options of BID insulin dosing that he feels that he could accomplish.   - Hemoglobin A1c  4. Vitamin D deficiency  - Continue diet, exercise, lifestyle modifications.  - Monitor appropriate labs. - Continue supplementation. - VITAMIN D 25 Hydroxy  5. Medication management  - CBC with Differential/Platelet - BASIC METABOLIC PANEL WITH GFR - Hepatic function panel - Magnesium - Lipid panel - TSH - Hemoglobin A1c - Insulin, random - VITAMIN D 25 Hydroxy       Discussed  regular exercise, BP monitoring, weight control to achieve/maintain BMI less than 25 and discussed med and SE's. Recommended labs to assess and monitor clinical status with further disposition pending results of labs. Over 30 minutes of exam,  counseling, chart review was performed.

## 2017-08-04 NOTE — Telephone Encounter (Signed)
Well, we can try: please ask him to take the 70/30: - 30 units before b'fast - 10 units before lunch - 30 units before dinner

## 2017-08-04 NOTE — Patient Instructions (Signed)

## 2017-08-04 NOTE — Telephone Encounter (Signed)
Pt is not able to follow the new insulin regimine he was put on please advise on what he can do   His regular MD had him on 70/30 can he go on a higher dose

## 2017-08-06 LAB — CBC WITH DIFFERENTIAL/PLATELET
Basophils Absolute: 31 cells/uL (ref 0–200)
Basophils Relative: 0.8 %
EOS PCT: 2.3 %
Eosinophils Absolute: 90 cells/uL (ref 15–500)
HCT: 38.7 % (ref 38.5–50.0)
Hemoglobin: 12.9 g/dL — ABNORMAL LOW (ref 13.2–17.1)
LYMPHS ABS: 616 {cells}/uL — AB (ref 850–3900)
MCH: 29.9 pg (ref 27.0–33.0)
MCHC: 33.3 g/dL (ref 32.0–36.0)
MCV: 89.8 fL (ref 80.0–100.0)
MONOS PCT: 12.5 %
MPV: 12.4 fL (ref 7.5–12.5)
NEUTROS PCT: 68.6 %
Neutro Abs: 2675 cells/uL (ref 1500–7800)
PLATELETS: 170 10*3/uL (ref 140–400)
RBC: 4.31 10*6/uL (ref 4.20–5.80)
RDW: 12.9 % (ref 11.0–15.0)
TOTAL LYMPHOCYTE: 15.8 %
WBC mixed population: 488 cells/uL (ref 200–950)
WBC: 3.9 10*3/uL (ref 3.8–10.8)

## 2017-08-06 LAB — BASIC METABOLIC PANEL WITH GFR
BUN: 13 mg/dL (ref 7–25)
CALCIUM: 9.7 mg/dL (ref 8.6–10.3)
CHLORIDE: 106 mmol/L (ref 98–110)
CO2: 23 mmol/L (ref 20–32)
Creat: 0.96 mg/dL (ref 0.70–1.18)
GFR, Est African American: 92 mL/min/{1.73_m2} (ref >=60)
GFR, Est Non African American: 80 mL/min/{1.73_m2} (ref >=60)
GLUCOSE: 328 mg/dL — AB (ref 65–99)
POTASSIUM: 4.4 mmol/L (ref 3.5–5.3)
SODIUM: 141 mmol/L (ref 135–146)

## 2017-08-06 LAB — HEMOGLOBIN A1C: Hgb A1c MFr Bld: >14 % of total Hgb — ABNORMAL HIGH (ref <5.7)

## 2017-08-06 LAB — LIPID PANEL
Cholesterol: 116 mg/dL (ref <200)
HDL: 37 mg/dL — AB (ref >40)
LDL Cholesterol (Calc): 42 mg/dL (calc)
Non-HDL Cholesterol (Calc): 79 mg/dL (calc) (ref <130)
TRIGLYCERIDES: 352 mg/dL — AB (ref <150)
Total CHOL/HDL Ratio: 3.1 (calc) (ref <5.0)

## 2017-08-06 LAB — TSH: TSH: 0.61 m[IU]/L (ref 0.40–4.50)

## 2017-08-06 LAB — HEPATIC FUNCTION PANEL
AG Ratio: 1.8 (calc) (ref 1.0–2.5)
ALKALINE PHOSPHATASE (APISO): 91 U/L (ref 40–115)
ALT: 18 U/L (ref 9–46)
AST: 15 U/L (ref 10–35)
Albumin: 4.2 g/dL (ref 3.6–5.1)
BILIRUBIN DIRECT: 0.1 mg/dL (ref 0.0–0.2)
Globulin: 2.3 g/dL (calc) (ref 1.9–3.7)
Indirect Bilirubin: 0.4 mg/dL (calc) (ref 0.2–1.2)
Total Bilirubin: 0.5 mg/dL (ref 0.2–1.2)
Total Protein: 6.5 g/dL (ref 6.1–8.1)

## 2017-08-06 LAB — INSULIN, RANDOM: Insulin: 34.4 u[IU]/mL — ABNORMAL HIGH (ref 2.0–19.6)

## 2017-08-06 LAB — MAGNESIUM: MAGNESIUM: 1.7 mg/dL (ref 1.5–2.5)

## 2017-08-06 LAB — VITAMIN D 25 HYDROXY (VIT D DEFICIENCY, FRACTURES): Vit D, 25-Hydroxy: 82 ng/mL (ref 30–100)

## 2017-08-09 ENCOUNTER — Telehealth: Payer: Self-pay

## 2017-08-09 ENCOUNTER — Telehealth: Payer: Self-pay | Admitting: Internal Medicine

## 2017-08-09 NOTE — Telephone Encounter (Signed)
Patient called back and was told about insulin changes. Patient will call back in a few days with sugars.

## 2017-08-09 NOTE — Telephone Encounter (Signed)
LVM advising patient to call back regarding insulin changes.  Left call back number.

## 2017-08-09 NOTE — Telephone Encounter (Signed)
Called patient back, spoke with him regarding insulin changes.

## 2017-08-09 NOTE — Telephone Encounter (Signed)
Patient stated our called office called him he returning the call

## 2017-08-09 NOTE — Telephone Encounter (Signed)
Patient called in stated that he needed the new insulin doses. I gave him the doses per MD. Patient understood and would call back in a few days and let us know how sugars were doing.

## 2017-08-09 NOTE — Telephone Encounter (Signed)
Called patient and LVM to call back regarding the insulin changes. Left call back number.

## 2017-09-10 ENCOUNTER — Other Ambulatory Visit: Payer: Self-pay | Admitting: Physician Assistant

## 2017-09-10 ENCOUNTER — Ambulatory Visit (INDEPENDENT_AMBULATORY_CARE_PROVIDER_SITE_OTHER): Payer: Medicare Other | Admitting: Internal Medicine

## 2017-09-10 ENCOUNTER — Other Ambulatory Visit: Payer: Self-pay | Admitting: Internal Medicine

## 2017-09-10 VITALS — BP 112/76 | HR 80 | Temp 97.8°F | Resp 18 | Ht 69.5 in | Wt 205.4 lb

## 2017-09-10 DIAGNOSIS — J0140 Acute pansinusitis, unspecified: Secondary | ICD-10-CM

## 2017-09-10 DIAGNOSIS — R61 Generalized hyperhidrosis: Secondary | ICD-10-CM | POA: Diagnosis not present

## 2017-09-10 DIAGNOSIS — J40 Bronchitis, not specified as acute or chronic: Secondary | ICD-10-CM

## 2017-09-10 DIAGNOSIS — M150 Primary generalized (osteo)arthritis: Secondary | ICD-10-CM | POA: Diagnosis not present

## 2017-09-10 DIAGNOSIS — E119 Type 2 diabetes mellitus without complications: Secondary | ICD-10-CM

## 2017-09-10 DIAGNOSIS — E1165 Type 2 diabetes mellitus with hyperglycemia: Secondary | ICD-10-CM | POA: Diagnosis not present

## 2017-09-10 DIAGNOSIS — M8949 Other hypertrophic osteoarthropathy, multiple sites: Secondary | ICD-10-CM

## 2017-09-10 DIAGNOSIS — M159 Polyosteoarthritis, unspecified: Secondary | ICD-10-CM

## 2017-09-10 MED ORDER — LEVOFLOXACIN 500 MG PO TABS: ORAL_TABLET | ORAL | 1 refills | Status: DC

## 2017-09-10 MED ORDER — PREDNISONE 10 MG PO TABS: ORAL_TABLET | ORAL | 0 refills | Status: DC

## 2017-09-10 MED ORDER — GABAPENTIN 800 MG PO TABS: ORAL_TABLET | ORAL | 1 refills | Status: DC

## 2017-09-10 MED ORDER — PROMETHAZINE-DM 6.25-15 MG/5ML PO SYRP: ORAL_SOLUTION | ORAL | 1 refills | Status: DC

## 2017-09-11 NOTE — Progress Notes (Signed)
Subjective:    Patient ID: Michael Guzman, male    DOB: [birth year], 71 y.o.   MRN: [id number]  HPI   This 71 yo MBM with HTN , HLD and poorly controlled Insulin requiring T2_DM presents with 1-2 week prodrome of increasing sinus congestion & HA's and chest congestion productive of a yellow-green sputum. Denies dyspnea, fever, chills, sweats or rash. Also patient is on Novolin 70/30 and recently had his tid dosing up'ed by Michael Guzman to 30-10-30 and  patient relates his CBG's are still running in the 250 to "high 300's". He admits ongoing compulsive gluttonous over eating. Denies polyuria, or visual changes. Does report ongoing dysthesias of his feet. Other issues include disruptive sweats attributed to his Lupron therapy for his Prostate ca.       Medication Sig  . aspirin EC 81 MG tablet Take 81 mg by mouth daily.  Marland Kitchen atorvastatin (LIPITOR) 20 MG tablet TAKE 1 TABLET BY MOUTH  DAILY  . Cholecalciferol (VITAMIN D3) 5000 UNITS CAPS Take 5,000 Units by mouth 2 (two) times daily.  . fluticasone (FLONASE) 50 MCG/ACT nasal spray Place 2 sprays into both nostrils daily.  . hydrocortisone (ANUSOL-HC) 25 MG suppository Place 25 mg rectally 2 (two) times daily as needed for hemorrhoids or itching.  . Magnesium 250 MG TABS Take 250 mg by mouth daily.  . megestrol (MEGACE) 20 MG tablet Take 20 mg by mouth 2 (two) times daily.  . metFORMIN-XR 500 MG 24                                                                                                                           TAKE 1 TO 2 TABLETS BY  MOUTH TWO TIMES DAILY AS  DIRECTED FOR DIABETES  . metoprolol succinate (TOPROL-XL) 25 MG 24 hr tablet TAKE ONE-HALF TABLET BY  MOUTH DAILY. TAKE WITH OR  IMMEDIATELY FOLLOWING A  MEAL.  . montelukast  10 MG tablet TAKE 1 TABLET BY MOUTH AT  BEDTIME   Multi-Vit w/ Min Take 1 tablet by mouth daily.  Marland Kitchen NITROSTAT 0.4 MG SL tab AS NEEDED FOR CHEST PAIN.  Marland Kitchen Omega-3 FISH OIL 1000 MG  Take 1,000 mg by mouth daily.   .  Pantoprazole m40 MG tablet TAKE 1 TABLET BY MOUTH  DAILY  . tamsulosin  0.4 MG CAPS  Take 0.4 mg by mouth at bedtime.  . gabapentin 600 MG tablet TAKE 1 TAB 3  TIMES A DAY AS NEEDED FOR  PAIN  . ondansetron ODT 4 MG  Take 1 tab every 8 (eight) hours as needed for nausea or vomiting.   Review of Systems  10 point systems review negative except as above.    Objective:   Physical Exam   BP 112/76   Pulse 80   Temp 97.8 F (36.6 C)   Resp 18   Ht 5' 9.5" (1.765 m)   Wt 205 lb 6.4 oz (93.2 kg)   BMI 29.90 kg/m   (+)  congested "loose" cough. No exposed rash. In No Distress  HEENT - Eac's patent. TM's Nl. EOM's full. PERRLA. NasoOroPharynx clear. Neck - supple. Nl Thyroid. Carotids 2+ & No bruits, nodes, JVD Chest -  Scattered coarse inspir rales and expiratry forced post tussi Cor - Nl HS. RRR w/o sig MGR. PP 1(+). No edema. MS- FROM w/o deformities. Muscle power, tone and bulk Nl. Gait Nl. Neuro - No obvious Cr N abnormalities. Sensory, motor and Cerebellar functions appear Nl w/o focal abnormalities. Psyche - Mental status normal & appropriate.  No delusions, ideations or obvious mood abnormalities. Skin - exposed clear w/o rash cyanosis or icterus.    Assessment & Plan:   1. Bronchitis  - levofloxacin (LEVAQUIN) 500 MG tablet; Take 1 tablet daily with food for infection  Disp: 15 tab; Refill: 1  - predniSONE 10 MG tablet; 1 tab 3 x day for 2 days, then 1 tab 2 x day for 2 days, then 1 tab 1 x day for 3 days  Disp: 13 tab; Refill: 0  - PROMETHAZINE-DM 6.25-15 MG/5ML syrup; Take 1 to 2 tsp enery 4 hours if needed for cough  Dispense: 360 mL; Refill: 1  2. Subacute pansinusitis  - levofloxacin  500 MG tablet; Take 1 tablet daily with food for infection  Dispense: 15 tablet; Refill: 1  - predniSONE  10 MG tablet; 1 tab 3 x day for 2 days, then 1 tab 2 x day for 2 days, then 1 tab 1 x day for 3 days  Dispense: 13 tablet; Refill: 0   3. Poorly controlled type 2 diabetes  mellitus ([place])  - advised increasing his Nov 70/30 up to 40-15-40 tid ac til next visit wit w Michael Guzman   4. Night sweats  - gabapentin (NEURONTIN) 800 MG tablet; Take 1 tablet 3 to 4 x / day for pain, hot flashes & sweats  Dispense: 360 tablet; Refill: 1  5. Primary osteoarthritis involving multiple joints  - gabapentin (NEURONTIN) 800 MG tablet; Take 1 tablet 3 to 4 x / day for pain, hot flashes & sweats  Dispense: 360 tablet; Refill: 1

## 2017-09-23 ENCOUNTER — Ambulatory Visit: Payer: Medicare Other | Admitting: Internal Medicine

## 2017-09-23 ENCOUNTER — Encounter: Payer: Self-pay | Admitting: Internal Medicine

## 2017-09-23 VITALS — BP 138/80 | HR 96 | Ht 69.5 in | Wt 210.2 lb

## 2017-09-23 DIAGNOSIS — E1165 Type 2 diabetes mellitus with hyperglycemia: Secondary | ICD-10-CM | POA: Diagnosis not present

## 2017-09-23 DIAGNOSIS — E069 Thyroiditis, unspecified: Secondary | ICD-10-CM | POA: Diagnosis not present

## 2017-09-23 NOTE — Patient Instructions (Addendum)
Please continue: - Metformin ER 1000 mg 2x a day with meals  Please stop the 70/30 insulin and start the following: Insulin Before breakfast Before lunch Before dinner  Regular 20 20 30   NPH 40  35   Please inject the insulin 30 min before meals.  Please return in 1.5 months with your sugar log.   Please consider the following ways to cut down carbs and fat and increase fiber and micronutrients in your diet: - substitute whole grain for white bread or pasta - substitute brown rice for white rice - substitute 90-calorie flat bread pieces for slices of bread when possible - substitute sweet potatoes or yams for white potatoes - substitute humus for margarine - substitute tofu for cheese when possible - substitute almond or rice milk for regular milk (would not drink soy milk daily due to concern for soy estrogen influence on breast cancer risk) - substitute dark chocolate for other sweets when possible - substitute water - can add lemon or orange slices for taste - for diet sodas (artificial sweeteners will trick your body that you can eat sweets without getting calories and will lead you to overeating and weight gain in the long run) - do not skip breakfast or other meals (this will slow down the metabolism and will result in more weight gain over time)  - can try smoothies made from fruit and almond/rice milk in am instead of regular breakfast - can also try old-fashioned (not instant) oatmeal made with almond/rice milk in am - order the dressing on the side when eating salad at a restaurant (pour less than half of the dressing on the salad) - eat as little meat as possible - can try juicing, but should not forget that juicing will get rid of the fiber, so would alternate with eating raw veg./fruits or drinking smoothies - use as little oil as possible, even when using olive oil - can dress a salad with a mix of balsamic vinegar and lemon juice, for e.g. - use agave nectar, stevia sugar,  or regular sugar rather than artificial sweateners - steam or broil/roast veggies  - snack on veggies/fruit/nuts (unsalted, preferably) when possible, rather than processed foods - reduce or eliminate aspartame in diet (it is in diet sodas, chewing gum, etc) Read the labels!  Try to read Dr. Janene Harvey book: "Program for Reversing Diabetes" for other ideas for healthy eating.

## 2017-09-23 NOTE — Progress Notes (Signed)
Patient ID: Michael Guzman, male   DOB: 1946-08-23, 72 y.o.   MRN: [id number]    HPI  Michael Guzman is a 72 y.o.-year-old male, initially referred by his PCP, Dr. Melford Aase, for evaluation for low TSH and thyroiditis, and now also DM2, dx few years ago, non-insulin-dependent, uncontrolled, with complications (+ PN, CKD). Last OV 1.5 mo ago.  I first saw him for type 2 diabetes 1.5 months ago, when I suggested that he comes off the premixed insulin and splits his insulin into basal-bolus  regimen.  He called [place] after the visit that he would like to stay on premixed insulin.  At that point, we increased his insulin doses  and switch back to Novolin 70/30.   DM2: Last hemoglobin A1c was: Lab Results  Component Value Date   HGBA1C >14.0 (H) 08/04/2017   HGBA1C 10.0 (H) 05/03/2017   HGBA1C 10.5 (H) 01/15/2017   Pt was on: - Metformin ER 500 mg 2x a day, with meals - Novolin 70/30 27 units in am and 23 in pm - at the time of the meal He was on Glipizide >> stopped when started insulin.  At last visit, I suggested the following regimen:  Insulin Before breakfast Before lunch Before dinner  Regular 15 10  20   NPH 30  20  Please inject the insulin 30 min before meals.  He refused the above regimen. Now on: - Metformin ER 1000 mg 2x a day, with meals - Novolin 70/30 40-15-40 units with meals, last dose increased by PCP  Pt checks his sugars 2-3x a day - per review of his log: - am: 300-547 >> 272-451 - 2h after b'fast: n/c >> 399--540 - before lunch: n/c >> 320-390 - 2h after lunch: n/c - before dinner: n/c >> 292-579 - 2h after dinner: n/c >> 437-505 - bedtime: >300-580 >> 490 - nighttime: n/c Lowest sugar was 310 >> 272; ? hypoglycemia awareness. Highest sugar was up to 580 >> hi.  Glucometer: One Touch Ultra  He saw nutrition.Pt's meals are: - Breakfast: eggs, bacon, toast - Lunch: smallest meal - Dinner: larger meal  -He has CKD, last BUN/creatinine:  Lab Results   Component Value Date   BUN 13 08/04/2017   BUN 15 05/03/2017   CREATININE 0.96 08/04/2017   CREATININE 1.04 05/03/2017   -He has HL; last set of lipids: Lab Results  Component Value Date   CHOL 116 08/04/2017   HDL 37 (L) 08/04/2017   LDLCALC 68 01/15/2017   LDLDIRECT 45.7 10/30/2013   TRIG 352 (H) 08/04/2017   CHOLHDL 3.1 08/04/2017  On Lipitor 20. - last eye exam was in 2018: Reportedly no DR -He has numbness and tingling in his feet.  He is on gabapentin 1800 mg daily. He is on ASA 1.  ? FH of DM  - adopted.  Thyroiditis: Reviewed history: Patient has a history of low TSH for years.   Previous Uptake and scan studies: A thyroid uptake and scan (03/27/2016) returned with uniform scan and suppressed uptake: Slightly decreased 24 hour radio iodine uptake at 8.4%. 09/02/2012  1.  Low normal (11%) 24 hour radioiodine uptake by the thyroid gland. Uptake on the prior studies was 31% and 27%. 2.  Stable scan with fairly homogeneous activity.  No focal hot nodules identified. 04/08/2012: The thyroid scan demonstrates fairly homogeneous uptake in the thyroid gland.  A pyramidal lobe is again demonstrated.  No hot or cold lesions are identified.  Normal uptake in the  salivary glands.The 24 hour I-131 uptake was calculated at 26.6%.  Normal is 10-35%. 10/27/2011: The 24 hour radioactive iodine uptake is upper limits of normal at 31.1%.  These findings may represent early Graves' disease or subclinical hyperthyroidism. Radiotracer uptake within the pyramidal lobe is noted. 07/11/2010: 24 are uptake remains within normal range of 18.9%.  The pertechnetate scan remains normal and unchanged in appearance. 04/26/2009: 24 hour I 131 uptake = 12 % (normal 10-30%). Essentially normal thyroid imaging and 24 prior uptake.  With a slightly depressed TSH, consider subacute thyroiditis or potentially early Grave's disease.  I reviewed pt's thyroid tests -normal 1.5 months ago: Lab Results   Component Value Date   TSH 0.61 08/04/2017   TSH 0.50 05/03/2017   TSH 0.49 01/15/2017   TSH 0.50 01/01/2017   TSH 0.39 (L) 12/03/2016   TSH 0.41 10/20/2016   TSH 0.44 06/23/2016   TSH 0.29 (L) 05/12/2016   TSH 0.39 (L) 03/10/2016   TSH 0.26 (L) 12/04/2015   FREET4 0.60 05/12/2016   FREET4 1.02 06/26/2012    Pt denies: - feeling nodules in neck - hoarseness - dysphagia - choking - SOB with lying down  Pt was adopted >> unclear if FH of thyroid ds. No FH of thyroid cancer. No h/o radiation tx to head or neck.  No seaweed or kelp. No recent contrast studies. No herbal supplements. No Biotin use. No recent steroids use.   Pt. also has a history of Prostate CA, colon cancer, DM2, HTN, GERD.  ROS: Constitutional: no weight gain/no weight loss, no fatigue, no subjective hyperthermia, no subjective hypothermia Eyes: no blurry vision, no xerophthalmia ENT: no sore throat, + see HPI Cardiovascular: no CP/no SOB/no palpitations/no leg swelling Respiratory: no cough/no SOB/no wheezing Gastrointestinal: no N/no V/no D/no C/no acid reflux Musculoskeletal: no muscle aches/no joint aches Skin: no rashes, no hair loss Neurological: no tremors/no numbness/no tingling/no dizziness  I reviewed pt's medications, allergies, PMH, social hx, family hx, and changes were documented in the history of present illness. Otherwise, unchanged from my initial visit note.  Past Medical History:  Diagnosis Date  . Arthritis   . CAD (coronary artery disease)   . Chronic back pain   . Chronically dry eyes   . Diabetes mellitus without complication ([place])   . Elevated PSA    being monitored  . GERD (gastroesophageal reflux disease)   . Hypertension   . Prostate cancer [facility])    Past Surgical History:  Procedure Laterality Date  . APPENDECTOMY    . CARDIAC SURGERY     3 stent placed.  . COLONOSCOPY    . LEFT HEART CATHETERIZATION WITH CORONARY ANGIOGRAM N/A 06/27/2012   Procedure: LEFT HEART  CATHETERIZATION WITH CORONARY ANGIOGRAM;  Surgeon: Candee Furbish, MD;  Location: [facility] CATH LAB;  Service: Cardiovascular;  Laterality: N/A;  . LEFT HEART CATHETERIZATION WITH CORONARY ANGIOGRAM N/A 07/25/2012   Procedure: LEFT HEART CATHETERIZATION WITH CORONARY ANGIOGRAM;  Surgeon: Sueanne Margarita, MD;  Location: [facility];  Service: Cardiovascular;  Laterality: N/A;  . PROSTATE BIOPSY    . RADIOLOGY WITH ANESTHESIA N/A 12/03/2015   Procedure: MRI LUMBAR SPINE;  Surgeon: Medication Radiologist, MD;  Location: [facility];  Service: Radiology;  Laterality: N/A;   Social History   Social History  . Marital status: Married    Spouse name: N/A  . Number of children: 4  . Years of education: 2 y colleg   Occupational History  . 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 Smoking status: Former Smoker    Years: 50.00    Quit date: 07/27/2011  . Smokeless tobacco: Never Used  . Alcohol use Yes     Comment: occasional  . Drug use: No   Current Outpatient Medications on File Prior to Visit  Medication Sig Dispense Refill  . aspirin EC 81 MG tablet Take 81 mg by mouth daily.    Marland Kitchen atorvastatin (LIPITOR) 20 MG tablet TAKE 1 TABLET BY MOUTH  DAILY 90 tablet 2  . Blood Glucose Monitoring Suppl (ONE TOUCH ULTRA SYSTEM KIT) w/Device KIT Check blood sugar 1 time daily-[id number] 1 each 0  . Cholecalciferol (VITAMIN D3) 5000 UNITS CAPS Take 5,000 Units by mouth 2 (two) times daily.    . fluticasone (FLONASE) 50 MCG/ACT nasal spray Place 2 sprays into both nostrils daily. 16 g 0  . gabapentin (NEURONTIN) 800 MG tablet Take 1 tablet 3 to 4 x / day for pain, hot flashes & sweats 360 tablet 1  . glucose blood (ONE TOUCH ULTRA TEST) test strip CHECK BLOOD SUGAR 2 TIMES  DAILY 100 each 2  . hydrocortisone (ANUSOL-HC) 25 MG suppository Place 25 mg rectally 2 (two) times daily as needed for hemorrhoids or itching.    . Magnesium 250 MG TABS Take 250 mg by mouth daily.    . megestrol (MEGACE) 20  MG tablet Take 20 mg by mouth 2 (two) times daily.    . metFORMIN (GLUCOPHAGE-XR) 500 MG 24 hr tablet TAKE 1 TO 2 TABLETS BY  MOUTH TWO TIMES DAILY AS  DIRECTED FOR DIABETES 360 tablet 3  . metoprolol succinate (TOPROL-XL) 25 MG 24 hr tablet TAKE ONE-HALF TABLET BY  MOUTH DAILY. TAKE WITH OR  IMMEDIATELY FOLLOWING A  MEAL. 45 tablet 2  . montelukast (SINGULAIR) 10 MG tablet TAKE 1 TABLET BY MOUTH AT  BEDTIME 90 tablet 1  . Multiple Vitamin (MULTIVITAMIN WITH MINERALS) TABS tablet Take 1 tablet by mouth daily.    . nitroGLYCERIN (NITROSTAT) 0.4 MG SL tablet PLACE ONE TABLET UNDER THE TONGUE EVERY 5 MINUTES AS NEEDED FOR CHEST PAIN. 25 tablet 6  . Omega-3 Fatty Acids (FISH OIL) 1000 MG CAPS Take 1,000 mg by mouth daily.     . pantoprazole (PROTONIX) 40 MG tablet TAKE 1 TABLET BY MOUTH  DAILY 90 tablet 1  . promethazine-dextromethorphan (PROMETHAZINE-DM) 6.25-15 MG/5ML syrup Take 1 to 2 tsp enery 4 hours if needed for cough 360 mL 1  . tamsulosin (FLOMAX) 0.4 MG CAPS capsule Take 0.4 mg by mouth at bedtime.     No current facility-administered medications on file prior to visit.    No Known Allergies Family History  Problem Relation Age of Onset  . Diabetes Sister    PE: BP 138/80   Pulse 96   Ht 5' 9.5" (1.765 m)   Wt 210 lb 3.2 oz (95.3 kg)   SpO2 98%   BMI 30.60 kg/m  Wt Readings from Last 3 Encounters:  09/23/17 210 lb 3.2 oz (95.3 kg)  09/10/17 205 lb 6.4 oz (93.2 kg)  08/04/17 208 lb 6.4 oz (94.5 kg)   Constitutional: overweight, in NAD Eyes: PERRLA, EOMI, no exophthalmos ENT: moist mucous membranes, no thyromegaly, no cervical lymphadenopathy Cardiovascular: Tachycardia; RR, No MRG Respiratory: CTA B Gastrointestinal: abdomen soft, NT, ND, BS+ Musculoskeletal: no deformities, strength intact in all 4 Skin: moist, warm, no rashes Neurological: no tremor with outstretched hands, DTR normal in all 4  ASSESSMENT: 1. Low TSH  2/2 Thyroiditis  2. DM2  PLAN:  1. Patient  with a history of low TSH secondary to thyroiditis in 2010, and the TSH has fluctuating the slightly low range since then. He has had several uptake and scan tests over the years with diverse results: c/w normal, Graves ds., thyroiditis (?).  Latest uptake and scan showed decreased iodine uptake and homogeneous scan, but this was performed 2 months after CT scan with contrast, which could have influenced the results. - Reviewed together his most recent TFTs, which were normal at last visit - We will continue to follow him clinically and plan to obtain another set of TFTs in 6 months to a year from the previous  2. DM2 - Patient with very uncontrolled diabetes, with extremely high sugars, on the premixed insulin regimen, doses which were recently increased.  We again discussed that this is not a perfect regimen for him and he agrees to go back to the NPH and regular insulin and we reviewed at length about how to mix the insulins and how to inject them. - We will increase his doses of NPH and regular insulin recommended at last visit.  I advised him to contact me through my chart if the sugars remain high so we can adjust the doses further before he comes in for next visit - Continue with 1000 mg 2x a day of metformin ER.  He is tolerating this well. - We also discussed about how to change his diet and given more written suggestions - I suggested to:  Patient Instructions   Please continue: - Metformin ER 1000 mg 2x a day with meals  Please stop the 70/30 insulin and start the following: Insulin Before breakfast Before lunch Before dinner  Regular 20 20 30   NPH 40  35   Please inject the insulin 30 min before meals.  Please return in 1.5 months with your sugar log.   - Reviewed together his previous HbA1c, which was undetectably high, >14%.  We will recheck this at next visit - continue checking sugars at different times of the day - check 3x a day, rotating checks - advised for yearly eye  exams >> he is UTD - Return to clinic in 1.5 mo with sugar log    Philemon Kingdom, MD PhD [facility] Endocrinology

## 2017-10-12 ENCOUNTER — Encounter (INDEPENDENT_AMBULATORY_CARE_PROVIDER_SITE_OTHER): Payer: Self-pay

## 2017-10-14 ENCOUNTER — Ambulatory Visit: Payer: Medicare Other | Admitting: Internal Medicine

## 2017-10-14 VITALS — BP 114/86 | HR 72 | Temp 97.1°F | Resp 18 | Ht 69.5 in | Wt 207.0 lb

## 2017-10-14 DIAGNOSIS — R239 Unspecified skin changes: Secondary | ICD-10-CM

## 2017-10-16 ENCOUNTER — Encounter: Payer: Self-pay | Admitting: Internal Medicine

## 2017-10-16 NOTE — Progress Notes (Signed)
 -   Patient presented with c/o pain on the dorsum of the Lt foot which he attributed to new shoes being too tight.   - Focused exam finds nl PP & capillary refill and co sign of ulceration or pt tenderness. Sensation intact to touch and Monofilament to the toes bilaterally.  - Advised to purchase wider sized shoes.   (No charge)

## 2017-10-19 ENCOUNTER — Ambulatory Visit: Payer: Self-pay | Admitting: Internal Medicine

## 2017-11-03 DIAGNOSIS — E66811 Obesity, class 1: Secondary | ICD-10-CM | POA: Insufficient documentation

## 2017-11-03 DIAGNOSIS — E669 Obesity, unspecified: Secondary | ICD-10-CM | POA: Insufficient documentation

## 2017-11-03 NOTE — Progress Notes (Signed)
FOLLOW UP  Assessment and Plan:   ASCAD s/p PTCA 2013 Continue to emphasize necessity for adherence to medications and recommended lifestyle Severely out of control diabetes ^ risks discussed Cholesterol/htn at goal Continue follow up with cardiology  Hypertension Well controlled with current medications  Monitor blood pressure at home; patient to call if consistently greater than 130/80 Continue DASH diet.   Reminder to go to the ER if any CP, SOB, nausea, dizziness, severe HA, changes vision/speech, left arm numbness and tingling and jaw pain.  Cholesterol Currently at LDL goal; diet for trigs discussed Continue low cholesterol diet and exercise.  Check lipid panel.   Diabetes with diabetic chronic kidney disease Continue medication: metformin, insulin regimen per Dr. Cruzita Lederer Continue diet and exercise.  Perform daily foot/skin check, notify office of any concerning changes.  Check A1C  Obesity with co morbidities Long discussion about weight loss, diet, and exercise Recommended diet heavy in fruits and veggies and low in animal meats, cheeses, and dairy products, appropriate calorie intake Discussed ideal weight for height  Patient will work on diet as discussed with Dr. Cruzita Lederer, start trying to exercise at least every other day. Start with 15 min of gentle walking Will follow up in 3 months  Vitamin D Def At goal at last visit; continue supplementation to maintain goal of 70-100 Defer Vit D level  Thyroiditis Recent TSHs normal; appears to have resolved Continue to monitor TSH today  GERD Symptoms well managed without breakthrough Will try to get off PPI given info for taper and zantac sent in  Fatigue ? Related to lupron as ongoing during duration of treatment and common SE; discussed will evaluate after this last shot tapers out of system, discussed antidepressant, possibly low dose of phentermine for weight loss and energy if sugars improve and fatigue  continues after lupron.   Continue diet and meds as discussed. Further disposition pending results of labs. Discussed med's effects and SE's.   Over 30 minutes of exam, counseling, chart review, and critical decision making was performed.   Future Appointments  Date Time Provider [place]  11/09/2017  9:00 AM Jerline Pain, MD [facility]  11/22/2017  9:30 AM Philemon Kingdom, MD [facility] None  02/08/2018 10:00 AM [name], MD [facility] None    ----------------------------------------------------------------------------------------------------------------------  HPI 72 y.o. male  presents for 3 month follow up on hypertension, cholesterol, T2 diabetes, obesity, GERD and vitamin D deficiency. He has hx of ASCAD s/p PTCA (06/2012), T2DM severely out of control (last A1C 14+), has been referred to Dr. Cruzita Lederer for management.   The patient has hx of prostate cancer treated by radiation in 2016-2017; he is followed by Dr. Junious Silk for ongoing lupron shots. He reports ongoing fatigue/malaise, "hot flashes"  ?attributable to injections or severely out of control diabetes.   he has a diagnosis of GERD which is currently managed by protonix 40 mg daily.  he reports symptoms is currently well controlled, and denies breakthrough reflux, burning in chest, hoarseness or cough.    BMI is Body mass index is 31.82 kg/m., he has not been working on diet and exercise. Wt Readings from Last 3 Encounters:  11/04/17 218 lb 9.6 oz (99.2 kg)  10/14/17 207 lb (93.9 kg)  09/23/17 210 lb 3.2 oz (95.3 kg)   His blood pressure has been controlled at home, today their BP is BP: 134/76  He does not workout. He denies chest pain, shortness of breath, dizziness.   He is on cholesterol medication  and denies myalgias. His LDL cholesterol is at goal, trigs remain elevated. The cholesterol last visit was:   Lab Results  Component Value Date   CHOL 116 08/04/2017   HDL 37 (L)  08/04/2017   LDLCALC 68 01/15/2017   LDLDIRECT 45.7 10/30/2013   TRIG 352 (H) 08/04/2017   CHOLHDL 3.1 08/04/2017    He has not been working on diet and exercise for T2DM, and denies increased appetite, nausea, paresthesia of the feet, polydipsia, polyuria, visual disturbances, vomiting and weight loss. Review of glucose log from Dr. Arman Filter note 09/23/2017 indicates fasting values ranging from 272 - 451, lowest sugar 272, highest glucose greater than 580. At his last visit with Dr. Cruzita Lederer, insulin regimen was switched from novolin 70/30 40-15-40 units to regular insulin 20-20-30 and NPH 40 - 35. He has been following the prescribed regimen. He does not present with a glucose log today, but reports "my sugars are down from the 400s to 270s or so." Last A1C in the office was:  Lab Results  Component Value Date   HGBA1C >14.0 (H) 08/04/2017   Patient is on Vitamin D supplement and at goal:    Lab Results  Component Value Date   VD25OH 82 08/04/2017     He has history of thyroiditis with fluxuating TSHs, last was normal/at goal - followed closely by Dr. Cruzita Lederer:  Lab Results  Component Value Date   TSH 0.61 08/04/2017  .     Current Medications:  Current Outpatient Medications on File Prior to Visit  Medication Sig  . aspirin EC 81 MG tablet Take 81 mg by mouth daily.  Marland Kitchen atorvastatin (LIPITOR) 20 MG tablet TAKE 1 TABLET BY MOUTH  DAILY  . Blood Glucose Monitoring Suppl (ONE TOUCH ULTRA SYSTEM KIT) w/Device KIT Check blood sugar 1 time daily-DX-E11.22  . Cholecalciferol (VITAMIN D3) 5000 UNITS CAPS Take 5,000 Units by mouth 2 (two) times daily.  . fluticasone (FLONASE) 50 MCG/ACT nasal spray Place 2 sprays into both nostrils daily.  Marland Kitchen gabapentin (NEURONTIN) 800 MG tablet Take 1 tablet 3 to 4 x / day for pain, hot flashes & sweats  . glucose blood (ONE TOUCH ULTRA TEST) test strip CHECK BLOOD SUGAR 2 TIMES  DAILY  . hydrocortisone (ANUSOL-HC) 25 MG suppository Place 25 mg rectally 2  (two) times daily as needed for hemorrhoids or itching.  . Magnesium 250 MG TABS Take 250 mg by mouth daily.  . megestrol (MEGACE) 20 MG tablet Take 20 mg by mouth 2 (two) times daily.  . metFORMIN (GLUCOPHAGE-XR) 500 MG 24 hr tablet TAKE 1 TO 2 TABLETS BY  MOUTH TWO TIMES DAILY AS  DIRECTED FOR DIABETES  . metoprolol succinate (TOPROL-XL) 25 MG 24 hr tablet TAKE ONE-HALF TABLET BY  MOUTH DAILY. TAKE WITH OR  IMMEDIATELY FOLLOWING A  MEAL.  . montelukast (SINGULAIR) 10 MG tablet TAKE 1 TABLET BY MOUTH AT  BEDTIME  . Multiple Vitamin (MULTIVITAMIN WITH MINERALS) TABS tablet Take 1 tablet by mouth daily.  . nitroGLYCERIN (NITROSTAT) 0.4 MG SL tablet PLACE ONE TABLET UNDER THE TONGUE EVERY 5 MINUTES AS NEEDED FOR CHEST PAIN.  Marland Kitchen Omega-3 Fatty Acids (FISH OIL) 1000 MG CAPS Take 1,000 mg by mouth daily.   . pantoprazole (PROTONIX) 40 MG tablet TAKE 1 TABLET BY MOUTH  DAILY  . promethazine-dextromethorphan (PROMETHAZINE-DM) 6.25-15 MG/5ML syrup Take 1 to 2 tsp enery 4 hours if needed for cough  . tamsulosin (FLOMAX) 0.4 MG CAPS capsule Take 0.4 mg by mouth at  bedtime.   No current facility-administered medications on file prior to visit.      Allergies: No Known Allergies   Medical History:  Past Medical History:  Diagnosis Date  . Arthritis   . CAD (coronary artery disease)   . Chronic back pain   . Chronically dry eyes   . Diabetes mellitus without complication ([place])   . Elevated PSA    being monitored  . GERD (gastroesophageal reflux disease)   . Hypertension   . Prostate cancer [facility])    Family history- Reviewed and unchanged Social history- Reviewed and unchanged   Review of Systems:  Review of Systems  Constitutional: Positive for diaphoresis and malaise/fatigue. Negative for chills, fever and weight loss.  HENT: Negative for hearing loss and tinnitus.   Eyes: Negative for blurred vision and double vision.  Respiratory: Negative for cough, shortness of breath and wheezing.    Cardiovascular: Negative for chest pain, palpitations, orthopnea, claudication and leg swelling.  Gastrointestinal: Negative for abdominal pain, blood in stool, constipation, diarrhea, heartburn, melena, nausea and vomiting.  Genitourinary: Negative.   Musculoskeletal: Negative for joint pain and myalgias.  Skin: Negative for rash.  Neurological: Negative for dizziness, tingling, sensory change, weakness and headaches.  Endo/Heme/Allergies: Negative for polydipsia.  Psychiatric/Behavioral: Negative.   All other systems reviewed and are negative.     Physical Exam: BP 134/76   Pulse 81   Temp 97.7 F (36.5 C)   Ht 5' 9.5" (1.765 m)   Wt 218 lb 9.6 oz (99.2 kg)   SpO2 97%   BMI 31.82 kg/m  Wt Readings from Last 3 Encounters:  11/04/17 218 lb 9.6 oz (99.2 kg)  10/14/17 207 lb (93.9 kg)  09/23/17 210 lb 3.2 oz (95.3 kg)   General Appearance: Well nourished, in no apparent distress. Eyes: PERRLA, EOMs, conjunctiva no swelling or erythema Sinuses: No Frontal/maxillary tenderness ENT/Mouth: Ext aud canals clear, TMs without erythema, bulging. No erythema, swelling, or exudate on post pharynx.  Tonsils not swollen or erythematous. Hearing normal.  Neck: Supple, thyroid normal.  Respiratory: Respiratory effort normal, BS equal bilaterally without rales, rhonchi, wheezing or stridor.  Cardio: RRR with no MRGs. Brisk peripheral pulses without edema.  Abdomen: Soft, + BS.  Non tender, no guarding, rebound, hernias, masses. Lymphatics: Non tender without lymphadenopathy.  Musculoskeletal: Full ROM, 5/5 strength, Normal gait Skin: Warm, dry without rashes, lesions, ecchymosis.  Neuro: Cranial nerves intact. No cerebellar symptoms.  Psych: Awake and oriented X 3, normal affect, Insight and Judgment appropriate.    Izora Ribas, NP 10:22 AM [facility]

## 2017-11-04 ENCOUNTER — Encounter: Payer: Self-pay | Admitting: Adult Health

## 2017-11-04 ENCOUNTER — Ambulatory Visit (INDEPENDENT_AMBULATORY_CARE_PROVIDER_SITE_OTHER): Payer: Medicare Other | Admitting: Adult Health

## 2017-11-04 VITALS — BP 134/76 | HR 81 | Temp 97.7°F | Ht 69.5 in | Wt 218.6 lb

## 2017-11-04 DIAGNOSIS — Z79899 Other long term (current) drug therapy: Secondary | ICD-10-CM | POA: Diagnosis not present

## 2017-11-04 DIAGNOSIS — E118 Type 2 diabetes mellitus with unspecified complications: Secondary | ICD-10-CM | POA: Diagnosis not present

## 2017-11-04 DIAGNOSIS — K219 Gastro-esophageal reflux disease without esophagitis: Secondary | ICD-10-CM

## 2017-11-04 DIAGNOSIS — I10 Essential (primary) hypertension: Secondary | ICD-10-CM | POA: Diagnosis not present

## 2017-11-04 DIAGNOSIS — Z8546 Personal history of malignant neoplasm of prostate: Secondary | ICD-10-CM

## 2017-11-04 DIAGNOSIS — E069 Thyroiditis, unspecified: Secondary | ICD-10-CM

## 2017-11-04 DIAGNOSIS — E559 Vitamin D deficiency, unspecified: Secondary | ICD-10-CM | POA: Diagnosis not present

## 2017-11-04 DIAGNOSIS — E669 Obesity, unspecified: Secondary | ICD-10-CM

## 2017-11-04 DIAGNOSIS — E1165 Type 2 diabetes mellitus with hyperglycemia: Secondary | ICD-10-CM | POA: Diagnosis not present

## 2017-11-04 DIAGNOSIS — Z9861 Coronary angioplasty status: Secondary | ICD-10-CM

## 2017-11-04 DIAGNOSIS — I2510 Atherosclerotic heart disease of native coronary artery without angina pectoris: Secondary | ICD-10-CM

## 2017-11-04 DIAGNOSIS — E782 Mixed hyperlipidemia: Secondary | ICD-10-CM

## 2017-11-04 MED ORDER — RANITIDINE HCL 150 MG PO TABS: 150.0000 mg | ORAL_TABLET | Freq: Two times a day (BID) | ORAL | 1 refills | Status: DC

## 2017-11-04 NOTE — Patient Instructions (Addendum)
  Start ranitidine twice a day, go to every other day with protonix for two weeks, then every third day, every fourth day, etc. If symptoms well controlled at this point you can stop the protonix completely.     GETTING OFF OF PPI's    Nexium/protonix/prilosec/Omeprazole/Dexilant/Aciphex are called PPI's, they are great at healing your stomach but should only be taken for a short period of time.     Recent studies have shown that taken for a long time they  can increase the risk of osteoporosis (weakening of your bones), pneumonia, low magnesium, restless legs, Cdiff (infection that causes diarrhea), DEMENTIA and most recently kidney damage / disease / insufficiency.     Due to this information we want to try to stop the PPI but if you try to stop it abruptly this can cause rebound acid and worsening symptoms.   So this is how we want you to get off the PPI: Generic is always fine!!  - Start taking the nexium/protonix/prilosec/PPI  every other day with  zantac (ranitidine) OR pepcid famotadine 2 x a day for 2-4 weeks - some people stay on this dosage and can not taper off further. Our main goal is to limit the dosage and amount you are taking so if you need to stay on this dose.   - then decrease the PPI to every 3 days while taking the zantac or pepcid 300mg  twice a day the other  days for 2-4  Weeks  - then you can try the zantac or pepcid 300mg  once at night or up to 2 x day as needed.  - you can continue on this once at night or stop all together  - Avoid alcohol, spicy foods, NSAIDS (aleve, ibuprofen) at this time. See foods below.   +++++++++++++++++++++++++++++++++++++++++++  Food Choices for Gastroesophageal Reflux Disease  When you have gastroesophageal reflux disease (GERD), the foods you eat and your eating habits are very important. Choosing the right foods can help ease the discomfort of GERD. WHAT GENERAL GUIDELINES DO I NEED TO FOLLOW?  Choose fruits, vegetables, whole  grains, low-fat dairy products, and low-fat meat, fish, and poultry.  Limit fats such as oils, salad dressings, butter, nuts, and avocado.  Keep a food diary to identify foods that cause symptoms.  Avoid foods that cause reflux. These may be different for different people.  Eat frequent small meals instead of three large meals each day.  Eat your meals slowly, in a relaxed setting.  Limit fried foods.  Cook foods using methods other than frying.  Avoid drinking alcohol.  Avoid drinking large amounts of liquids with your meals.  Avoid bending over or lying down until 2-3 hours after eating.   WHAT FOODS ARE NOT RECOMMENDED? The following are some foods and drinks that may worsen your symptoms:  Vegetables Tomatoes. Tomato juice. Tomato and spaghetti sauce. Chili peppers. Onion and garlic. Horseradish. Fruits Oranges, grapefruit, and lemon (fruit and juice). Meats High-fat meats, fish, and poultry. This includes hot dogs, ribs, ham, sausage, salami, and bacon. Dairy Whole milk and chocolate milk. Sour cream. Cream. Butter. Ice cream. Cream cheese.  Beverages Coffee and tea, with or without caffeine. Carbonated beverages or energy drinks. Condiments Hot sauce. Barbecue sauce.  Sweets/Desserts Chocolate and cocoa. Donuts. Peppermint and spearmint. Fats and Oils High-fat foods, including [place] fries and potato chips. Other Vinegar. Strong spices, such as black pepper, white pepper, red pepper, cayenne, curry powder, cloves, ginger, and chili powder.

## 2017-11-05 LAB — CBC WITH DIFFERENTIAL/PLATELET
BASOS ABS: 41 {cells}/uL (ref 0–200)
BASOS PCT: 1 %
EOS ABS: 176 {cells}/uL (ref 15–500)
Eosinophils Relative: 4.3 %
HCT: 38.5 % (ref 38.5–50.0)
HEMOGLOBIN: 13.2 g/dL (ref 13.2–17.1)
Lymphs Abs: 771 cells/uL — ABNORMAL LOW (ref 850–3900)
MCH: 30.6 pg (ref 27.0–33.0)
MCHC: 34.3 g/dL (ref 32.0–36.0)
MCV: 89.1 fL (ref 80.0–100.0)
MPV: 12.1 fL (ref 7.5–12.5)
Monocytes Relative: 13 %
NEUTROS ABS: 2579 {cells}/uL (ref 1500–7800)
Neutrophils Relative %: 62.9 %
Platelets: 177 10*3/uL (ref 140–400)
RBC: 4.32 10*6/uL (ref 4.20–5.80)
RDW: 12.9 % (ref 11.0–15.0)
Total Lymphocyte: 18.8 %
WBC: 4.1 10*3/uL (ref 3.8–10.8)
WBCMIX: 533 {cells}/uL (ref 200–950)

## 2017-11-05 LAB — HEPATIC FUNCTION PANEL
AG Ratio: 2 (calc) (ref 1.0–2.5)
ALKALINE PHOSPHATASE (APISO): 73 U/L (ref 40–115)
ALT: 21 U/L (ref 9–46)
AST: 14 U/L (ref 10–35)
Albumin: 4.3 g/dL (ref 3.6–5.1)
BILIRUBIN DIRECT: 0.1 mg/dL (ref 0.0–0.2)
BILIRUBIN INDIRECT: 0.4 mg/dL (ref 0.2–1.2)
BILIRUBIN TOTAL: 0.5 mg/dL (ref 0.2–1.2)
GLOBULIN: 2.1 g/dL (ref 1.9–3.7)
TOTAL PROTEIN: 6.4 g/dL (ref 6.1–8.1)

## 2017-11-05 LAB — BASIC METABOLIC PANEL WITH GFR
BUN: 10 mg/dL (ref 7–25)
CO2: 22 mmol/L (ref 20–32)
CREATININE: 0.88 mg/dL (ref 0.70–1.18)
Calcium: 9.5 mg/dL (ref 8.6–10.3)
Chloride: 105 mmol/L (ref 98–110)
GFR, EST AFRICAN AMERICAN: 101 mL/min/{1.73_m2} (ref >=60)
GFR, EST NON AFRICAN AMERICAN: 87 mL/min/{1.73_m2} (ref >=60)
GLUCOSE: 258 mg/dL — AB (ref 65–99)
Potassium: 3.9 mmol/L (ref 3.5–5.3)
SODIUM: 138 mmol/L (ref 135–146)

## 2017-11-05 LAB — LIPID PANEL
CHOL/HDL RATIO: 2.7 (calc) (ref <5.0)
Cholesterol: 104 mg/dL (ref <200)
HDL: 39 mg/dL — AB (ref >40)
LDL Cholesterol (Calc): 34 mg/dL (calc)
NON-HDL CHOLESTEROL (CALC): 65 mg/dL (ref <130)
Triglycerides: 265 mg/dL — ABNORMAL HIGH (ref <150)

## 2017-11-05 LAB — TSH: TSH: 0.56 m[IU]/L (ref 0.40–4.50)

## 2017-11-05 LAB — HEMOGLOBIN A1C
EAG (MMOL/L): 17.3 (calc)
Hgb A1c MFr Bld: 12.5 % of total Hgb — ABNORMAL HIGH (ref <5.7)
MEAN PLASMA GLUCOSE: 312 (calc)

## 2017-11-05 LAB — MAGNESIUM: Magnesium: 1.9 mg/dL (ref 1.5–2.5)

## 2017-11-08 ENCOUNTER — Encounter: Payer: Self-pay | Admitting: Internal Medicine

## 2017-11-08 DIAGNOSIS — M47816 Spondylosis without myelopathy or radiculopathy, lumbar region: Secondary | ICD-10-CM | POA: Diagnosis not present

## 2017-11-09 ENCOUNTER — Ambulatory Visit: Payer: Medicare Other | Admitting: Cardiology

## 2017-11-09 ENCOUNTER — Encounter: Payer: Self-pay | Admitting: Cardiology

## 2017-11-09 VITALS — BP 122/84 | HR 99 | Ht 70.0 in | Wt 213.2 lb

## 2017-11-09 DIAGNOSIS — I10 Essential (primary) hypertension: Secondary | ICD-10-CM

## 2017-11-09 DIAGNOSIS — I2510 Atherosclerotic heart disease of native coronary artery without angina pectoris: Secondary | ICD-10-CM

## 2017-11-09 DIAGNOSIS — E782 Mixed hyperlipidemia: Secondary | ICD-10-CM

## 2017-11-09 DIAGNOSIS — Z9861 Coronary angioplasty status: Secondary | ICD-10-CM

## 2017-11-09 DIAGNOSIS — C61 Malignant neoplasm of prostate: Secondary | ICD-10-CM | POA: Diagnosis not present

## 2017-11-09 DIAGNOSIS — E7800 Pure hypercholesterolemia, unspecified: Secondary | ICD-10-CM | POA: Diagnosis not present

## 2017-11-09 NOTE — Progress Notes (Signed)
[facility]. [address] [place], [place]  [address] Phone: [phone] Fax:  [phone]  Date:  11/09/2017   ID:  Michael, Guzman [birth year], MRN [id number]  PCP:  [name], MD   History of Present Illness: Michael Guzman is a 72 y.o. male with coronary artery disease 10/13 drug eluting stent to mid LAD and OM2, normal ejection fraction with recurrent chest discomfort, repeat catheterization reassuring. Prostate cancer.   He continues to have fatigue as it is major complaint.  He thinks this is from his Lupron shots.  Sometimes he will have sweats out of nowhere.  He is taken his temperature to make sure he does not have a fever.  He has had radiation as well.  Has been seeing endocrinologist.  Insulin.  Hemoglobin A1c was 12.5, glucose was as high as 400 at one point.  He is trying to alter his diet but is having difficulty with this.  Understands the importance.  For recreation, he enjoys fishing, he has 1 previous fishing tournaments.    Wife previously at [facility] for a prolonged period of time following mitral valve repair. Currently on dialysis.   Overall he has been doing quite well without any anginal symptoms.  He is just overall fatigued and short of breath activity.  No significant changes.  No bleeding, no syncope, no orthopnea.   Wt Readings from Last 3 Encounters:  11/09/17 213 lb 3.2 oz (96.7 kg)  11/04/17 218 lb 9.6 oz (99.2 kg)  10/14/17 207 lb (93.9 kg)     Past Medical History:  Diagnosis Date  . Arthritis   . CAD (coronary artery disease)   . Chronic back pain   . Chronically dry eyes   . Diabetes mellitus without complication ([place])   . Elevated PSA    being monitored  . GERD (gastroesophageal reflux disease)   . Hypertension   . Prostate cancer [facility])     Past Surgical History:  Procedure Laterality Date  . APPENDECTOMY    . CARDIAC SURGERY     3 stent placed.  . COLONOSCOPY    . LEFT HEART CATHETERIZATION WITH  CORONARY ANGIOGRAM N/A 06/27/2012   Procedure: LEFT HEART CATHETERIZATION WITH CORONARY ANGIOGRAM;  Surgeon: Candee Furbish, MD;  Location: [facility] CATH LAB;  Service: Cardiovascular;  Laterality: N/A;  . LEFT HEART CATHETERIZATION WITH CORONARY ANGIOGRAM N/A 07/25/2012   Procedure: LEFT HEART CATHETERIZATION WITH CORONARY ANGIOGRAM;  Surgeon: Sueanne Margarita, MD;  Location: [facility] CATH LAB;  Service: Cardiovascular;  Laterality: N/A;  . PROSTATE BIOPSY    . RADIOLOGY WITH ANESTHESIA N/A 12/03/2015   Procedure: MRI LUMBAR SPINE;  Surgeon: Medication Radiologist, MD;  Location: [place];  Service: Radiology;  Laterality: N/A;    Current Outpatient Medications  Medication Sig Dispense Refill  . aspirin EC 81 MG tablet Take 81 mg by mouth daily.    Marland Kitchen atorvastatin (LIPITOR) 20 MG tablet TAKE 1 TABLET BY MOUTH  DAILY 90 tablet 2  . Blood Glucose Monitoring Suppl (ONE TOUCH ULTRA SYSTEM KIT) w/Device KIT Check blood sugar 1 time daily-DX-E11.22 1 each 0  . Cholecalciferol (VITAMIN D3) 5000 UNITS CAPS Take 5,000 Units by mouth 2 (two) times daily.    . fluticasone (FLONASE) 50 MCG/ACT nasal spray Place 2 sprays into both nostrils daily. 16 g 0  . gabapentin (NEURONTIN) 800 MG tablet Take 1 tablet 3 to 4 x / day for pain, hot flashes & sweats 360 tablet  1  . glucose blood (ONE TOUCH ULTRA TEST) test strip CHECK BLOOD SUGAR 2 TIMES  DAILY 100 each 2  . hydrocortisone (ANUSOL-HC) 25 MG suppository Place 25 mg rectally 2 (two) times daily as needed for hemorrhoids or itching.    . Magnesium 250 MG TABS Take 250 mg by mouth daily.    . megestrol (MEGACE) 20 MG tablet Take 20 mg by mouth 2 (two) times daily.    . metFORMIN (GLUCOPHAGE-XR) 500 MG 24 hr tablet TAKE 1 TO 2 TABLETS BY  MOUTH TWO TIMES DAILY AS  DIRECTED FOR DIABETES 360 tablet 3  . metoprolol succinate (TOPROL-XL) 25 MG 24 hr tablet TAKE ONE-HALF TABLET BY  MOUTH DAILY. TAKE WITH OR  IMMEDIATELY FOLLOWING A  MEAL. 45 tablet 2  . montelukast (SINGULAIR) 10 MG  tablet TAKE 1 TABLET BY MOUTH AT  BEDTIME 90 tablet 1  . Multiple Vitamin (MULTIVITAMIN WITH MINERALS) TABS tablet Take 1 tablet by mouth daily.    . nitroGLYCERIN (NITROSTAT) 0.4 MG SL tablet PLACE ONE TABLET UNDER THE TONGUE EVERY 5 MINUTES AS NEEDED FOR CHEST PAIN. 25 tablet 6  . Omega-3 Fatty Acids (FISH OIL) 1000 MG CAPS Take 1,000 mg by mouth daily.     . pantoprazole (PROTONIX) 40 MG tablet TAKE 1 TABLET BY MOUTH  DAILY 90 tablet 1  . promethazine-dextromethorphan (PROMETHAZINE-DM) 6.25-15 MG/5ML syrup Take 1 to 2 tsp enery 4 hours if needed for cough 360 mL 1  . ranitidine (ZANTAC) 150 MG tablet Take 1 tablet (150 mg total) by mouth 2 (two) times daily. 180 tablet 1  . tamsulosin (FLOMAX) 0.4 MG CAPS capsule Take 0.4 mg by mouth at bedtime.     No current facility-administered medications for this visit.     Allergies:    No Known Allergies  Social History:  The patient  reports that he quit smoking about 6 years ago. He quit after 50.00 years of use. he has never used smokeless tobacco. He reports that he drinks alcohol. He reports that he does not use drugs.   ROS:  Please see the history of present illness.   All other ROS PHYSICAL EXAM: VS:  BP 122/84   Pulse 99   Ht '5\' 10"'$  (1.778 m)   Wt 213 lb 3.2 oz (96.7 kg)   SpO2 97%   BMI 30.59 kg/m  GEN: Well nourished, well developed, in no acute distress, obese HEENT: normal  Neck: no JVD, carotid bruits, or masses Cardiac: RRR; no murmurs, rubs, or gallops,no edema  Respiratory:  clear to auscultation bilaterally, normal work of breathing GI: soft, nontender, nondistended, + BS MS: no deformity or atrophy  Skin: warm and dry, no rash Neuro:  Alert and Oriented x 3, Strength and sensation are intact Psych: euthymic mood, full affect   EKG:   Prior 2/36/18 shows sinus tachycardia heart rate 104 with no other abnormalities. 05/07/15-sinus rhythm, 81, no other abnormalities  ASSESSMENT AND PLAN:  Coronary artery  disease-post PCI to LAD, OM. Doing well. No anginal symptoms, stable. I will continue with ASA. No evidence of bleeding. Main complaints seem to be centered around fatigue, Lupron. Continue with activity. Tired and out of breath.   Prostate cancer-many of his symptoms seem to be centered around he believes Lupron.  Hypertension- Decreased metoprolol to 12.'5mg'$ .  Overall doing well.  I thought previously that he was on an ACE inhibitor.  Abscesses change.  Consider this for renal protection.  Diabetes with renal complication- trying to  control. Hemoglobin A1c 13. Going to see endocrine.   He is working hard on this currently. Taking Insulin. Glucose was 400.  Continued encouragement.  Hyperlipidemia- I decreased his atorvastatin to 20 mg at prior visit. Triglycerides have improved to 265 from 459. His LDL was 42. Excellent.   Hypertension-quite low BP at prior visit. His Toprol was decreased at previous visit which likely is causing his slightly increased baseline heart rate 104 bpm. No change today. Continue. Good control    I will see him back in 12 months.  Signed, Candee Furbish, MD [facility]  11/09/2017 9:31 AM

## 2017-11-09 NOTE — Patient Instructions (Signed)

## 2017-11-18 NOTE — Progress Notes (Signed)
Assessment and Plan:  Essential hypertension Take meds when he gets home Monitor at home Of over 120/70 call the office and we will adjust meds  Poorly controlled type 2 diabetes mellitus with complication ([place]) Keep food log and follow up with endocrine  Right ear pain -     amoxicillin-clavulanate (AUGMENTIN) 875-125 MG tablet; Take 1 tablet by mouth 2 (two) times daily for 7 days.  Dysphagia, unspecified type -     Ambulatory referral to Gastroenterology - may need EGD/dilitation - no lymphadenopathy  Episode of recurrent major depressive disorder, unspecified depression episode severity (HCC) -     buPROPion (WELLBUTRIN XL) 150 MG 24 hr tablet; Take 1 tablet (150 mg total) by mouth every morning. ? Contributing to fatigue, willing to do 1 month trial of medication   Future Appointments  Date Time Provider [place]  11/22/2017  9:30 AM Philemon Kingdom, MD [facility] None  02/08/2018 10:00 AM [name], MD [facility] None    HPI 72 y.o. AAM with history of HTN, DM2, CAD, neuropathy, prostate cancer on lupron injections, thyroiditis presents for fatigue and right ear pain.  He states he has right ear pain x 3 days, sinus congestion, headache, no fever or chills. He has taken tylenol, allergy pill, flonase, and singulair. He has also been off balance.   He has been feeling like he has a "flappy thing" in his throat x 2-3 weeks affecting his breathing, he can swallow liquid but he trouble with food. He does not spit any mucus up.   He has had fatigue for several months/close to a year.  He had recent normal follow up with Dr. Marlou Porch, cardiology.  He follows with urology, had radiation for prostate cancer and is on lupron.  Denies depression however he has fatigue, muscle aches, no motivation, occ HA, stomach issues- he has a lot of somatic complaints.   He has uncontrolled DM with neuropathy, following with endocrine, has follow up Monday.  Lab Results    Component Value Date   HGBA1C 12.5 (H) 11/04/2017   BMI is Body mass index is 30.85 kg/m., he is working on diet and exercise. Wt Readings from Last 3 Encounters:  11/19/17 215 lb (97.5 kg)  11/09/17 213 lb 3.2 oz (96.7 kg)  11/04/17 218 lb 9.6 oz (99.2 kg)    Past Medical History:  Diagnosis Date  . Arthritis   . CAD (coronary artery disease)   . Chronic back pain   . Chronically dry eyes   . Diabetes mellitus without complication ([place])   . Elevated PSA    being monitored  . GERD (gastroesophageal reflux disease)   . Hypertension   . Prostate cancer ([place])      No Known Allergies  Current Outpatient Medications on File Prior to Visit  Medication Sig  . aspirin EC 81 MG tablet Take 81 mg by mouth daily.  Marland Kitchen atorvastatin (LIPITOR) 20 MG tablet TAKE 1 TABLET BY MOUTH  DAILY  . Blood Glucose Monitoring Suppl (ONE TOUCH ULTRA SYSTEM KIT) w/Device KIT Check blood sugar 1 time daily-[id number]  . Cholecalciferol (VITAMIN D3) 5000 UNITS CAPS Take 5,000 Units by mouth 2 (two) times daily.  . fluticasone (FLONASE) 50 MCG/ACT nasal spray Place 2 sprays into both nostrils daily.  Marland Kitchen gabapentin (NEURONTIN) 800 MG tablet Take 1 tablet 3 to 4 x / day for pain, hot flashes & sweats  . glucose blood (ONE TOUCH ULTRA TEST) test strip CHECK BLOOD SUGAR 2 TIMES  DAILY  .  hydrocortisone (ANUSOL-HC) 25 MG suppository Place 25 mg rectally 2 (two) times daily as needed for hemorrhoids or itching.  . Magnesium 250 MG TABS Take 250 mg by mouth daily.  . megestrol (MEGACE) 20 MG tablet Take 20 mg by mouth 2 (two) times daily.  . metFORMIN (GLUCOPHAGE-XR) 500 MG 24 hr tablet TAKE 1 TO 2 TABLETS BY  MOUTH TWO TIMES DAILY AS  DIRECTED FOR DIABETES  . metoprolol succinate (TOPROL-XL) 25 MG 24 hr tablet TAKE ONE-HALF TABLET BY  MOUTH DAILY. TAKE WITH OR  IMMEDIATELY FOLLOWING A  MEAL.  . montelukast (SINGULAIR) 10 MG tablet TAKE 1 TABLET BY MOUTH AT  BEDTIME  . Multiple Vitamin (MULTIVITAMIN WITH MINERALS)  TABS tablet Take 1 tablet by mouth daily.  . nitroGLYCERIN (NITROSTAT) 0.4 MG SL tablet PLACE ONE TABLET UNDER THE TONGUE EVERY 5 MINUTES AS NEEDED FOR CHEST PAIN.  Marland Kitchen Omega-3 Fatty Acids (FISH OIL) 1000 MG CAPS Take 1,000 mg by mouth daily.   . pantoprazole (PROTONIX) 40 MG tablet TAKE 1 TABLET BY MOUTH  DAILY  . promethazine-dextromethorphan (PROMETHAZINE-DM) 6.25-15 MG/5ML syrup Take 1 to 2 tsp enery 4 hours if needed for cough  . ranitidine (ZANTAC) 150 MG tablet Take 1 tablet (150 mg total) by mouth 2 (two) times daily.  . tamsulosin (FLOMAX) 0.4 MG CAPS capsule Take 0.4 mg by mouth at bedtime.   No current facility-administered medications on file prior to visit.     ROS: all negative except above.   Physical Exam: Filed Weights   11/19/17 1028  Weight: 215 lb (97.5 kg)   BP (!) 150/84   Pulse 84   Temp 97.8 F (36.6 C)   Resp 18   Ht 5' 10" (1.778 m)   Wt 215 lb (97.5 kg)   BMI 30.85 kg/m  General Appearance: Well nourished, in no apparent distress. Eyes: PERRLA, EOMs, conjunctiva no swelling or erythema Sinuses: No Frontal/maxillary tenderness ENT/Mouth: Ext aud canals clear, TMs without erythema, bulging. No erythema, swelling, or exudate on post pharynx.  Tonsils not swollen or erythematous. Hearing normal.  Neck: Supple, thyroid normal.  Respiratory: Respiratory effort normal, BS equal bilaterally without rales, rhonchi, wheezing or stridor.  Cardio: RRR with no MRGs. Brisk peripheral pulses without edema.  Abdomen: Soft, + BS.  Non tender, no guarding, rebound, hernias, masses. Lymphatics: Non tender without lymphadenopathy.  Musculoskeletal: Full ROM, 5/5 strength, normal gait.  Skin: Warm, dry without rashes, lesions, ecchymosis.  Neuro: Cranial nerves intact. Normal muscle tone, no cerebellar symptoms. Sensation intact.  Psych: Awake and oriented X 3, normal affect, Insight and Judgment appropriate.     Vicie Mutters, PA-C 10:39 AM [facility]

## 2017-11-19 ENCOUNTER — Ambulatory Visit (INDEPENDENT_AMBULATORY_CARE_PROVIDER_SITE_OTHER): Payer: Medicare Other | Admitting: Physician Assistant

## 2017-11-19 VITALS — BP 150/84 | HR 84 | Temp 97.8°F | Resp 18 | Ht 70.0 in | Wt 215.0 lb

## 2017-11-19 DIAGNOSIS — I10 Essential (primary) hypertension: Secondary | ICD-10-CM

## 2017-11-19 DIAGNOSIS — F339 Major depressive disorder, recurrent, unspecified: Secondary | ICD-10-CM | POA: Diagnosis not present

## 2017-11-19 DIAGNOSIS — H9201 Otalgia, right ear: Secondary | ICD-10-CM | POA: Diagnosis not present

## 2017-11-19 DIAGNOSIS — R1310 Dysphagia, unspecified: Secondary | ICD-10-CM

## 2017-11-19 DIAGNOSIS — E118 Type 2 diabetes mellitus with unspecified complications: Secondary | ICD-10-CM | POA: Diagnosis not present

## 2017-11-19 DIAGNOSIS — E1165 Type 2 diabetes mellitus with hyperglycemia: Secondary | ICD-10-CM | POA: Diagnosis not present

## 2017-11-19 MED ORDER — AMOXICILLIN-POT CLAVULANATE 875-125 MG PO TABS: 1.0000 | ORAL_TABLET | Freq: Two times a day (BID) | ORAL | 0 refills | Status: AC

## 2017-11-19 MED ORDER — BUPROPION HCL ER (XL) 150 MG PO TB24: 150.0000 mg | ORAL_TABLET | ORAL | 2 refills | Status: DC

## 2017-11-19 NOTE — Patient Instructions (Addendum)
Keep a food log or take picture of ANY food that you.   Monitor your blood pressure at home, please keep a record and bring that in with you to your next office visit.   Go to the ER if any CP, SOB, nausea, dizziness, severe HA, changes vision/speech  Due to a recent study, SPRINT, we have changed our goal for the systolic or top blood pressure number. Ideally we want your top number at 120.  In the [facility] Trial, 5000 people were randomized to a goal BP of 120 and 5000 people were randomized to a goal BP of less than 140. The patients with the goal BP at 120 had LESS DEMENTIA, LESS HEART ATTACKS, AND LESS STROKES, AS WELL AS OVERALL DECREASED MORTALITY OR DEATH RATE.   If you are willing, our goal BP is the top number of 120.  Your most recent BP: BP: (!) 150/84   Take your medications faithfully as instructed. Maintain a healthy weight. Get at least 150 minutes of aerobic exercise per week. Minimize salt intake. Minimize alcohol intake  DASH Eating Plan DASH stands for "Dietary Approaches to Stop Hypertension." The DASH eating plan is a healthy eating plan that has been shown to reduce high blood pressure (hypertension). Additional health benefits may include reducing the risk of type 2 diabetes mellitus, heart disease, and stroke. The DASH eating plan may also help with weight loss. WHAT DO I NEED TO KNOW ABOUT THE DASH EATING PLAN? For the DASH eating plan, you will follow these general guidelines:  Choose foods with a percent daily value for sodium of less than 5% (as listed on the food label).  Use salt-free seasonings or herbs instead of table salt or sea salt.  Check with your health care provider or pharmacist before using salt substitutes.  Eat lower-sodium products, often labeled as "lower sodium" or "no salt added."  Eat fresh foods.  Eat more vegetables, fruits, and low-fat dairy products.  Choose whole grains. Look for the word "whole" as the first word in the  ingredient list.  Choose fish and skinless chicken or [place] more often than red meat. Limit fish, poultry, and meat to 6 oz (170 g) each day.  Limit sweets, desserts, sugars, and sugary drinks.  Choose heart-healthy fats.  Limit cheese to 1 oz (28 g) per day.  Eat more home-cooked food and less restaurant, buffet, and fast food.  Limit fried foods.  Cook foods using methods other than frying.  Limit canned vegetables. If you do use them, rinse them well to decrease the sodium.  When eating at a restaurant, ask that your food be prepared with less salt, or no salt if possible. WHAT FOODS CAN I EAT? Seek help from a dietitian for individual calorie needs. Grains Whole grain or whole wheat bread. Brown rice. Whole grain or whole wheat pasta. Quinoa, bulgur, and whole grain cereals. Low-sodium cereals. Corn or whole wheat flour tortillas. Whole grain cornbread. Whole grain crackers. Low-sodium crackers. Vegetables Fresh or frozen vegetables (raw, steamed, roasted, or grilled). Low-sodium or reduced-sodium tomato and vegetable juices. Low-sodium or reduced-sodium tomato sauce and paste. Low-sodium or reduced-sodium canned vegetables.  Fruits All fresh, canned (in natural juice), or frozen fruits. Meat and Other Protein Products Ground beef (85% or leaner), grass-fed beef, or beef trimmed of fat. Skinless chicken or [place]. Ground chicken or [place]. Pork trimmed of fat. All fish and seafood. Eggs. Dried beans, peas, or lentils. Unsalted nuts and seeds. Unsalted canned beans. Dairy Low-fat  dairy products, such as skim or 1% milk, 2% or reduced-fat cheeses, low-fat ricotta or cottage cheese, or plain low-fat yogurt. Low-sodium or reduced-sodium cheeses. Fats and Oils Tub margarines without trans fats. Light or reduced-fat mayonnaise and salad dressings (reduced sodium). Avocado. Safflower, olive, or canola oils. Natural peanut or almond butter. Other Unsalted popcorn and pretzels. The  items listed above may not be a complete list of recommended foods or beverages. Contact your dietitian for more options. WHAT FOODS ARE NOT RECOMMENDED? Grains White bread. White pasta. White rice. Refined cornbread. Bagels and croissants. Crackers that contain trans fat. Vegetables Creamed or fried vegetables. Vegetables in a cheese sauce. Regular canned vegetables. Regular canned tomato sauce and paste. Regular tomato and vegetable juices. Fruits Dried fruits. Canned fruit in light or heavy syrup. Fruit juice. Meat and Other Protein Products Fatty cuts of meat. Ribs, chicken wings, bacon, sausage, bologna, salami, chitterlings, fatback, hot dogs, bratwurst, and packaged luncheon meats. Salted nuts and seeds. Canned beans with salt. Dairy Whole or 2% milk, cream, half-and-half, and cream cheese. Whole-fat or sweetened yogurt. Full-fat cheeses or blue cheese. Nondairy creamers and whipped toppings. Processed cheese, cheese spreads, or cheese curds. Condiments Onion and garlic salt, seasoned salt, table salt, and sea salt. Canned and packaged gravies. Worcestershire sauce. Tartar sauce. Barbecue sauce. Teriyaki sauce. Soy sauce, including reduced sodium. Steak sauce. Fish sauce. Oyster sauce. Cocktail sauce. Horseradish. Ketchup and mustard. Meat flavorings and tenderizers. Bouillon cubes. Hot sauce. Tabasco sauce. Marinades. Taco seasonings. Relishes. Fats and Oils Butter, stick margarine, lard, shortening, ghee, and bacon fat. Coconut, palm kernel, or palm oils. Regular salad dressings. Other Pickles and olives. Salted popcorn and pretzels. The items listed above may not be a complete list of foods and beverages to avoid. Contact your dietitian for more information. WHERE CAN I FIND MORE INFORMATION? National Heart, Lung, and Blood Institute: travelstabloid.com Document Released: 08/20/2011 Document Revised: 01/15/2014 Document Reviewed: 07/05/2013 [facility]  Patient Information 2015 [place], [place]. This information is not intended to replace advice given to you by your health care provider. Make sure you discuss any questions you have with your health care provider.     Fatigue Fatigue is feeling tired all of the time, a lack of energy, or a lack of motivation. Occasional or mild fatigue is often a normal response to activity or life in general. However, long-lasting (chronic) or extreme fatigue may indicate an underlying medical condition. Follow these instructions at home: -Watch your fatigue for any changes. The following actions may help to lessen any discomfort you are feeling:  Talk to your health care provider about how much sleep you need each night. Try to get the required amount every night.  Take medicines only as directed by your health care provider.  Eat a healthy and nutritious diet. Ask your health care provider if you need help changing your diet.  Drink enough fluid to keep your urine clear or pale yellow.  Practice ways of relaxing, such as yoga, meditation, massage therapy, or acupuncture.  Exercise regularly.  Change situations that cause you stress. Try to keep your work and personal routine reasonable.  Do not abuse illegal drugs.  Limit alcohol intake to no more than 1 drink per day for nonpregnant women and 2 drinks per day for men. One drink equals 12 ounces of beer, 5 ounces of wine, or 1 ounces of hard liquor.  Take a multivitamin, if directed by your health care provider.  Contact a health care provider if:  Your  fatigue does not get better.  You have a fever.  You have unintentional weight loss or gain.  You have headaches.  You have difficulty: ? Falling asleep. ? Sleeping throughout the night.  You feel angry, guilty, anxious, or sad.  You are unable to have a bowel movement (constipation).  You skin is dry.  Your legs or another part of your body is swollen. Get help right away  if:  You feel confused.  Your vision is blurry.  You feel faint or pass out.  You have a severe headache.  You have severe abdominal, pelvic, or back pain.  You have chest pain, shortness of breath, or an irregular or fast heartbeat.  You are unable to urinate or you urinate less than normal.  You develop abnormal bleeding, such as bleeding from the rectum, vagina, nose, lungs, or nipples.  You vomit blood.  You have thoughts about harming yourself or committing suicide.  You are worried that you might harm someone else. This information is not intended to replace advice given to you by your health care provider. Make sure you discuss any questions you have with your health care provider. Document Released: 06/28/2007 Document Revised: 02/06/2016 Document Reviewed: 01/02/2014 Elsevier Interactive Patient Education  2018 [place] With Depression Everyone experiences occasional disappointment, sadness, and loss in their lives. When you are feeling down, blue, or sad for at least 2 weeks in a row, it may mean that you have depression. Depression can affect your thoughts and feelings, relationships, daily activities, and physical health. It is caused by changes in the way your brain functions. If you receive a diagnosis of depression, your health care provider will tell you which type of depression you have and what treatment options are available to you. If you are living with depression, there are ways to help you recover from it and also ways to prevent it from coming back. How to cope with lifestyle changes Coping with stress Stress is your body's reaction to life changes and events, both good and bad. Stressful situations may include:  Getting married.  The death of a spouse.  Losing a job.  Retiring.  Having a baby.  Stress can last just a few hours or it can be ongoing. Stress can play a major role in depression, so it is important to learn both how to  cope with stress and how to think about it differently. Talk with your health care provider or a counselor if you would like to learn more about stress reduction. He or she may suggest some stress reduction techniques, such as:  Music therapy. This can include creating music or listening to music. Choose music that you enjoy and that inspires you.  Mindfulness-based meditation. This kind of meditation can be done while sitting or walking. It involves being aware of your normal breaths, rather than trying to control your breathing.  Centering prayer. This is a kind of meditation that involves focusing on a spiritual word or phrase. Choose a word, phrase, or sacred image that is meaningful to you and that brings you peace.  Deep breathing. To do this, expand your stomach and inhale slowly through your nose. Hold your breath for 3-5 seconds, then exhale slowly, allowing your stomach muscles to relax.  Muscle relaxation. This involves intentionally tensing muscles then relaxing them.  Choose a stress reduction technique that fits your lifestyle and personality. Stress reduction techniques take time and practice to develop. Set aside 5-15 minutes a day  to do them. Therapists can offer training in these techniques. The training may be covered by some insurance plans. Other things you can do to manage stress include:  Keeping a stress diary. This can help you learn what triggers your stress and ways to control your response.  Understanding what your limits are and saying no to requests or events that lead to a schedule that is too full.  Thinking about how you respond to certain situations. You may not be able to control everything, but you can control how you react.  Adding humor to your life by watching funny films or TV shows.  Making time for activities that help you relax and not feeling guilty about spending your time this way.  Medicines Your health care provider may suggest certain  medicines if he or she feels that they will help improve your condition. Avoid using alcohol and other substances that may prevent your medicines from working properly (may interact). It is also important to:  Talk with your pharmacist or health care provider about all the medicines that you take, their possible side effects, and what medicines are safe to take together.  Make it your goal to take part in all treatment decisions (shared decision-making). This includes giving input on the side effects of medicines. It is best if shared decision-making with your health care provider is part of your total treatment plan.  If your health care provider prescribes a medicine, you may not notice the full benefits of it for 4-8 weeks. Most people who are treated for depression need to be on medicine for at least 6-12 months after they feel better. If you are taking medicines as part of your treatment, do not stop taking medicines without first talking to your health care provider. You may need to have the medicine slowly decreased (tapered) over time to decrease the risk of harmful side effects. Relationships Your health care provider may suggest family therapy along with individual therapy and drug therapy. While there may not be family problems that are causing you to feel depressed, it is still important to make sure your family learns as much as they can about your mental health. Having your family's support can help make your treatment successful. How to recognize changes in your condition Everyone has a different response to treatment for depression. Recovery from major depression happens when you have not had signs of major depression for two months. This may mean that you will start to:  Have more interest in doing activities.  Feel less hopeless than you did 2 months ago.  Have more energy.  Overeat less often, or have better or improving appetite.  Have better concentration.  Your health care  provider will work with you to decide the next steps in your recovery. It is also important to recognize when your condition is getting worse. Watch for these signs:  Having fatigue or low energy.  Eating too much or too little.  Sleeping too much or too little.  Feeling restless, agitated, or hopeless.  Having trouble concentrating or making decisions.  Having unexplained physical complaints.  Feeling irritable, angry, or aggressive.  Get help as soon as you or your family members notice these symptoms coming back. How to get support and help from others How to talk with friends and family members about your condition Talking to friends and family members about your condition can provide you with one way to get support and guidance. Reach out to trusted friends or family  members, explain your symptoms to them, and let them know that you are working with a health care provider to treat your depression. Financial resources Not all insurance plans cover mental health care, so it is important to check with your insurance carrier. If paying for co-pays or counseling services is a problem, search for a local or county mental health care center. They may be able to offer public mental health care services at low or no cost when you are not able to see a private health care provider. If you are taking medicine for depression, you may be able to get the generic form, which may be less expensive. Some makers of prescription medicines also offer help to patients who cannot afford the medicines they need. Follow these instructions at home:  Get the right amount and quality of sleep.  Cut down on using caffeine, tobacco, alcohol, and other potentially harmful substances.  Try to exercise, such as walking or lifting small weights.  Take over-the-counter and prescription medicines only as told by your health care provider.  Eat a healthy diet that includes plenty of vegetables, fruits, whole  grains, low-fat dairy products, and lean protein. Do not eat a lot of foods that are high in solid fats, added sugars, or salt.  Keep all follow-up visits as told by your health care provider. This is important. Contact a health care provider if:  You stop taking your antidepressant medicines, and you have any of these symptoms: ? Nausea. ? Headache. ? Feeling lightheaded. ? Chills and body aches. ? Not being able to sleep (insomnia).  You or your friends and family think your depression is getting worse. Get help right away if:  You have thoughts of hurting yourself or others. If you ever feel like you may hurt yourself or others, or have thoughts about taking your own life, get help right away. You can go to your nearest emergency department or call:  Your local emergency services (911 in the [place]  A suicide crisis helpline, such as the [facility] at (930)142-8533. This is open 24-hours a day.  Summary  If you are living with depression, there are ways to help you recover from it and also ways to prevent it from coming back.  Work with your health care team to create a management plan that includes counseling, stress management techniques, and healthy lifestyle habits. This information is not intended to replace advice given to you by your health care provider. Make sure you discuss any questions you have with your health care provider. Document Released: 08/03/2016 Document Revised: 08/03/2016 Document Reviewed: 08/03/2016 Elsevier Interactive Patient Education  [employer].

## 2017-11-22 ENCOUNTER — Ambulatory Visit: Payer: Medicare Other | Admitting: Internal Medicine

## 2017-11-22 ENCOUNTER — Encounter: Payer: Self-pay | Admitting: Internal Medicine

## 2017-11-22 VITALS — BP 132/74 | HR 79 | Ht 70.0 in | Wt 215.8 lb

## 2017-11-22 DIAGNOSIS — E069 Thyroiditis, unspecified: Secondary | ICD-10-CM

## 2017-11-22 MED ORDER — DULAGLUTIDE 0.75 MG/0.5ML ~~LOC~~ SOAJ: SUBCUTANEOUS | 1 refills | Status: DC

## 2017-11-22 NOTE — Patient Instructions (Addendum)
Please continue: - Metformin ER 1000 mg 2x a day with meals  Please increase: - N and R insulins: Insulin Before breakfast Before lunch Before dinner  Regular 20 >> 25 20 >> 25 30 >> 35  NPH 40  35  Please inject the insulin 30 min before meals.  Please start Trulicity 5.39 mg weekly. Let me know when you are close to running out to call in the higher dose to your pharmacy (1.5 mg).  Please return in 1.5 months with your sugar log.

## 2017-11-22 NOTE — Progress Notes (Signed)
Patient ID: Michael Guzman, male   DOB: [birth year], 72 y.o.   MRN: [id number]    HPI  Michael Guzman is a 72 y.o.-year-old male, initially referred by his PCP, Dr. Melford Aase, for evaluation for low TSH and thyroiditis, and now also DM2, dx few years ago, non-insulin-dependent, uncontrolled, with complications (+ PN, CKD). Last OV 2 mo ago.  DM2: Last hemoglobin A1c was: Lab Results  Component Value Date   HGBA1C 12.5 (H) 11/04/2017   HGBA1C >14.0 (H) 08/04/2017   HGBA1C 10.0 (H) 05/03/2017   He was on: - Metformin ER 1000 mg 2x a day, with meals - Novolin 70/30 40-15-40 units with meals, last dose increased by PCP  At last visit, he finally accepted to start N and R insulin:  Metformin ER 1000 mg 2x a day with meals - N and R insulins: Insulin Before breakfast Before lunch Before dinner  Regular _0 NPH 40  35   Please inject the insulin 30 min before meals.  Pt checks his sugars 2-3x a day - per log: - am: 300-547 >> 272-451 >> 253-410, 440 - 2h after b'fast: n/c >> 399-540 >> n/c - before lunch: n/c >> 320-390 >> n/c - 2h after lunch: n/c - before dinner: n/c >> 292-579 >> n/c - 2h after dinner: n/c >> 437-505 >> 372 - bedtime: >300-580 >> 490 >> n/c - nighttime: n/c Lowest sugar was 310 >> 272 >> 253 ; Unclear at which level he has hypoglycemia awareness. Highest sugar was up to 580 >> HI >> 440.  Glucometer: One Touch Ultra  He saw nutrition.Pt's meals are: - Breakfast: eggs, bacon, toast - Lunch: smallest meal - Dinner: larger meal  - + mildCKD, last BUN/creatinine:  Lab Results  Component Value Date   BUN 10 11/04/2017   BUN 13 08/04/2017   CREATININE 0.88 11/04/2017   CREATININE 0.96 08/04/2017   - + HL; last set of lipids: Lab Results  Component Value Date   CHOL 104 11/04/2017   HDL 39 (L) 11/04/2017   LDLCALC 68 01/15/2017   LDLDIRECT 45.7 10/30/2013   TRIG 265 (H) 11/04/2017   CHOLHDL 2.7 11/04/2017  On Lipitor 20. Also, omega 3  FAs. - last eye exam was in 2018: reportedly no DR. - + numbness and tingling in his feet.  He is on gabapentin 1800 mg daily. On ASA 81.  Thyroiditis: Reviewedhistory: He has a long h/o low TSH, but TFTs normalized in the last year  Reviewed the reports of his previous studies: A thyroid uptake and scan (03/27/2016) returned with uniform scan and suppressed uptake: Slightly decreased 24 hour radio iodine uptake at 8.4%. 09/02/2012  1.  Low normal (11%) 24 hour radioiodine uptake by the thyroid gland. Uptake on the prior studies was 31% and 27%. 2.  Stable scan with fairly homogeneous activity.  No focal hot nodules identified. 04/08/2012: The thyroid scan demonstrates fairly homogeneous uptake in the thyroid gland.  A pyramidal lobe is again demonstrated.  No hot or cold lesions are identified.  Normal uptake in the salivary glands.The 24 hour I-131 uptake was calculated at 26.6%.  Normal is 10-35%. 10/27/2011: The 24 hour radioactive iodine uptake is upper limits of normal at 31.1%.  These findings may represent early Graves' disease or subclinical hyperthyroidism. Radiotracer uptake within the pyramidal lobe is noted. 07/11/2010: 24 are uptake remains within normal range of 18.9%.  The pertechnetate scan remains normal and unchanged in appearance. 04/26/2009: 24 hour I  131 uptake = 12 % (normal 10-30%). Essentially normal thyroid imaging and 24 prior uptake.  With a slightly depressed TSH, consider subacute thyroiditis or potentially early Grave's disease.  His TFTs normalized recently: Lab Results  Component Value Date   TSH 0.56 11/04/2017   TSH 0.61 08/04/2017   TSH 0.50 05/03/2017   TSH 0.49 01/15/2017   TSH 0.50 01/01/2017   TSH 0.39 (L) 12/03/2016   TSH 0.41 10/20/2016   TSH 0.44 06/23/2016   TSH 0.29 (L) 05/12/2016   TSH 0.39 (L) 03/10/2016   FREET4 0.60 05/12/2016   FREET4 1.02 06/26/2012    Pt denies: - feeling nodules in neck - hoarseness - dysphagia -  choking - SOB with lying down  Pt was adopted >> unclear if FH of thyroid ds. No FH of thyroid cancer. No h/o radiation tx to head or neck.  No seaweed or kelp. No recent contrast studies. No herbal supplements. No Biotin use. No recent steroids use.   Pt. also has a history of prostate CA, colon CA, HTN, GERD.  ROS: Constitutional: no weight gain/no weight loss, no fatigue, no subjective hyperthermia, no subjective hypothermia Eyes: no blurry vision, no xerophthalmia ENT: no sore throat, + see HPI Cardiovascular: no CP/no SOB/no palpitations/no leg swelling Respiratory: no cough/no SOB/no wheezing Gastrointestinal: no N/no V/no D/no C/no acid reflux Musculoskeletal: no muscle aches/+ joint aches Skin: no rashes, no hair loss Neurological: no tremors/+ numbness/+ tingling/no dizziness  I reviewed pt's medications, allergies, PMH, social hx, family hx, and changes were documented in the history of present illness. Otherwise, unchanged from my initial visit note.   Past Medical History:  Diagnosis Date  . Arthritis   . CAD (coronary artery disease)   . Chronic back pain   . Chronically dry eyes   . Diabetes mellitus without complication ([place])   . Elevated PSA    being monitored  . GERD (gastroesophageal reflux disease)   . Hypertension   . Prostate cancer [facility])    Past Surgical History:  Procedure Laterality Date  . APPENDECTOMY    . CARDIAC SURGERY     3 stent placed.  . COLONOSCOPY    . LEFT HEART CATHETERIZATION WITH CORONARY ANGIOGRAM N/A 06/27/2012   Procedure: LEFT HEART CATHETERIZATION WITH CORONARY ANGIOGRAM;  Surgeon: Candee Furbish, MD;  Location: [facility] CATH LAB;  Service: Cardiovascular;  Laterality: N/A;  . LEFT HEART CATHETERIZATION WITH CORONARY ANGIOGRAM N/A 07/25/2012   Procedure: LEFT HEART CATHETERIZATION WITH CORONARY ANGIOGRAM;  Surgeon: Sueanne Margarita, MD;  Location: [facility] CATH LAB;  Service: Cardiovascular;  Laterality: N/A;  . PROSTATE BIOPSY    . RADIOLOGY  WITH ANESTHESIA N/A 12/03/2015   Procedure: MRI LUMBAR SPINE;  Surgeon: Medication Radiologist, MD;  Location: [place];  Service: Radiology;  Laterality: N/A;   Social History   Social History  . Marital status: Married    Spouse name: N/A  . Number of children: 4  . Years of education: 2 y colleg   Occupational History  . 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 Smoking status: Former Smoker    Years: 50.00    Quit date: 07/27/2011  . Smokeless tobacco: Never Used  . Alcohol use Yes     Comment: occasional  . Drug use: No   Current Outpatient Medications on File Prior to Visit  Medication Sig Dispense Refill  . amoxicillin-clavulanate (AUGMENTIN) 875-125 MG tablet Take 1 tablet by mouth 2 (two) times daily for 7 days.  14 tablet 0  . aspirin EC 81 MG tablet Take 81 mg by mouth daily.    Marland Kitchen atorvastatin (LIPITOR) 20 MG tablet TAKE 1 TABLET BY MOUTH  DAILY 90 tablet 2  . Blood Glucose Monitoring Suppl (ONE TOUCH ULTRA SYSTEM KIT) w/Device KIT Check blood sugar 1 time daily-DX-E11.22 1 each 0  . buPROPion (WELLBUTRIN XL) 150 MG 24 hr tablet Take 1 tablet (150 mg total) by mouth every morning. 30 tablet 2  . Cholecalciferol (VITAMIN D3) 5000 UNITS CAPS Take 5,000 Units by mouth 2 (two) times daily.    . fluticasone (FLONASE) 50 MCG/ACT nasal spray Place 2 sprays into both nostrils daily. 16 g 0  . gabapentin (NEURONTIN) 800 MG tablet Take 1 tablet 3 to 4 x / day for pain, hot flashes & sweats 360 tablet 1  . glucose blood (ONE TOUCH ULTRA TEST) test strip CHECK BLOOD SUGAR 2 TIMES  DAILY 100 each 2  . hydrocortisone (ANUSOL-HC) 25 MG suppository Place 25 mg rectally 2 (two) times daily as needed for hemorrhoids or itching.    . Magnesium 250 MG TABS Take 250 mg by mouth daily.    . megestrol (MEGACE) 20 MG tablet Take 20 mg by mouth 2 (two) times daily.    . metFORMIN (GLUCOPHAGE-XR) 500 MG 24 hr tablet TAKE 1 TO 2 TABLETS BY  MOUTH TWO TIMES DAILY AS  DIRECTED FOR  DIABETES 360 tablet 3  . metoprolol succinate (TOPROL-XL) 25 MG 24 hr tablet TAKE ONE-HALF TABLET BY  MOUTH DAILY. TAKE WITH OR  IMMEDIATELY FOLLOWING A  MEAL. 45 tablet 2  . montelukast (SINGULAIR) 10 MG tablet TAKE 1 TABLET BY MOUTH AT  BEDTIME 90 tablet 1  . Multiple Vitamin (MULTIVITAMIN WITH MINERALS) TABS tablet Take 1 tablet by mouth daily.    . nitroGLYCERIN (NITROSTAT) 0.4 MG SL tablet PLACE ONE TABLET UNDER THE TONGUE EVERY 5 MINUTES AS NEEDED FOR CHEST PAIN. 25 tablet 6  . Omega-3 Fatty Acids (FISH OIL) 1000 MG CAPS Take 1,000 mg by mouth daily.     . pantoprazole (PROTONIX) 40 MG tablet TAKE 1 TABLET BY MOUTH  DAILY 90 tablet 1  . promethazine-dextromethorphan (PROMETHAZINE-DM) 6.25-15 MG/5ML syrup Take 1 to 2 tsp enery 4 hours if needed for cough 360 mL 1  . ranitidine (ZANTAC) 150 MG tablet Take 1 tablet (150 mg total) by mouth 2 (two) times daily. 180 tablet 1  . tamsulosin (FLOMAX) 0.4 MG CAPS capsule Take 0.4 mg by mouth at bedtime.     No current facility-administered medications on file prior to visit.    No Known Allergies Family History  Problem Relation Age of Onset  . Diabetes Sister    PE: BP 132/74   Pulse 79   Ht _0  (1.778 m)   Wt 215 lb 12.8 oz (97.9 kg)   SpO2 98%   BMI 30.96 kg/m  Wt Readings from Last 3 Encounters:  11/22/17 215 lb 12.8 oz (97.9 kg)  11/19/17 215 lb (97.5 kg)  11/09/17 213 lb 3.2 oz (96.7 kg)   Constitutional: overweight, in NAD Eyes: PERRLA, EOMI, no exophthalmos ENT: moist mucous membranes, no thyromegaly, no cervical lymphadenopathy Cardiovascular: RRR, No MRG Respiratory: CTA B Gastrointestinal: abdomen soft, NT, ND, BS+ Musculoskeletal: no deformities, strength intact in all 4 Skin: moist, warm, no rashes Neurological: no tremor with outstretched hands, DTR normal in all 4  ASSESSMENT: 1. Low TSH 2/2 Thyroiditis  2. DM2  PLAN:  1. Patient with a history  of low TSH secondary to thyroiditis, diagnosed in 2010, with  fluctuating TFTs since then, and a TSH slightly low.  However, in the last year, his TFTs normalized.  He had several uptake and scan test over the years with diverse results: Normal, Graves' disease, thyroiditis (?).  Latest uptake and scan showed decreased iodine uptake and homogeneous scan (but this was performed 2 months after CT with contrast which could have influenced the results). - Reviewed together his most recent TFTs from 10/2017 and these were normal.  We will not repeat them today. - We will continue to follow him clinically and with yearly TFTs.  2. DM2 - Patient with very uncontrolled diabetes, with extremely high sugars at last visit, on the premixed insulin regimen, despite increasing the doses.  He refused splitting the premixed insulin into N and R insulin, but he finally agreed to this at last visit, as his sugars were extremely high.  We continued with metformin ER 1000 mg twice a day.  We also discussed at last visit about changing his diet, which is paramount in controlling his diabetes. - Reviewed together his most recent-HbA1c, which was still high, at 12.5%, but improved from undetectably high - At this visit, sugars are still high, despite compliance with his regimen. - We again discussed about the importance of reducing fat in his diet to improve his insulin resistance, but we will also increase his mealtime insulin slightly and try to add a GLP-1 receptor agonist. - I suggested to:  Patient Instructions   Please continue: - Metformin ER 1000 mg 2x a day with meals  Please increase: - N and R insulins: Insulin Before breakfast Before lunch Before dinner  Regular 20 >> 25 20 >> 25 30 >> 35  NPH 40  35  Please inject the insulin 30 min before meals.  Please start Trulicity 1.00 mg weekly. Let me know when you are close to running out to call in the higher dose to your pharmacy (1.5 mg).  Please return in 1.5 months with your sugar log.   - continue checking sugars  at different times of the day - check 3x a day, rotating checks - advised for yearly eye exams >> he is UTD - Return to clinic in 1.5 mo with sugar log    Philemon Kingdom, MD PhD [facility] Endocrinology

## 2017-11-23 ENCOUNTER — Encounter: Payer: Self-pay | Admitting: Internal Medicine

## 2017-11-23 ENCOUNTER — Other Ambulatory Visit: Payer: Self-pay | Admitting: *Deleted

## 2017-11-23 MED ORDER — METOPROLOL SUCCINATE ER 25 MG PO TB24: ORAL_TABLET | ORAL | 3 refills | Status: DC

## 2017-11-24 DIAGNOSIS — M47816 Spondylosis without myelopathy or radiculopathy, lumbar region: Secondary | ICD-10-CM | POA: Diagnosis not present

## 2017-11-29 DIAGNOSIS — R1312 Dysphagia, oropharyngeal phase: Secondary | ICD-10-CM | POA: Diagnosis not present

## 2017-12-01 ENCOUNTER — Other Ambulatory Visit: Payer: Self-pay | Admitting: Cardiology

## 2017-12-01 ENCOUNTER — Other Ambulatory Visit: Payer: Self-pay | Admitting: Internal Medicine

## 2017-12-10 ENCOUNTER — Other Ambulatory Visit: Payer: Self-pay | Admitting: Physical Medicine and Rehabilitation

## 2017-12-10 DIAGNOSIS — M5416 Radiculopathy, lumbar region: Secondary | ICD-10-CM | POA: Diagnosis not present

## 2017-12-10 DIAGNOSIS — M545 Low back pain: Secondary | ICD-10-CM

## 2017-12-13 ENCOUNTER — Encounter: Payer: Self-pay | Admitting: Internal Medicine

## 2017-12-14 ENCOUNTER — Other Ambulatory Visit: Payer: Self-pay

## 2017-12-16 ENCOUNTER — Other Ambulatory Visit: Payer: Self-pay

## 2017-12-16 MED ORDER — DULAGLUTIDE 0.75 MG/0.5ML ~~LOC~~ SOAJ
SUBCUTANEOUS | 1 refills | Status: DC
Start: 2017-12-16 — End: 2018-01-12

## 2017-12-21 ENCOUNTER — Ambulatory Visit
Admission: RE | Admit: 2017-12-21 | Discharge: 2017-12-21 | Disposition: A | Discharge status: Home / Self Care | Payer: Medicare Other | Source: Ambulatory Visit | Attending: Physical Medicine and Rehabilitation | Admitting: Physical Medicine and Rehabilitation

## 2017-12-21 DIAGNOSIS — M48061 Spinal stenosis, lumbar region without neurogenic claudication: Secondary | ICD-10-CM | POA: Diagnosis not present

## 2017-12-21 DIAGNOSIS — M545 Low back pain: Secondary | ICD-10-CM

## 2017-12-24 DIAGNOSIS — M48061 Spinal stenosis, lumbar region without neurogenic claudication: Secondary | ICD-10-CM | POA: Diagnosis not present

## 2017-12-27 DIAGNOSIS — C61 Malignant neoplasm of prostate: Secondary | ICD-10-CM | POA: Diagnosis not present

## 2018-01-04 DIAGNOSIS — M48061 Spinal stenosis, lumbar region without neurogenic claudication: Secondary | ICD-10-CM | POA: Diagnosis not present

## 2018-01-12 ENCOUNTER — Encounter: Payer: Self-pay | Admitting: Internal Medicine

## 2018-01-12 ENCOUNTER — Ambulatory Visit: Payer: Medicare Other | Admitting: Internal Medicine

## 2018-01-12 VITALS — BP 122/80 | HR 80 | Ht 70.0 in | Wt 222.4 lb

## 2018-01-12 DIAGNOSIS — E069 Thyroiditis, unspecified: Secondary | ICD-10-CM | POA: Diagnosis not present

## 2018-01-12 DIAGNOSIS — E1165 Type 2 diabetes mellitus with hyperglycemia: Secondary | ICD-10-CM | POA: Diagnosis not present

## 2018-01-12 DIAGNOSIS — E1142 Type 2 diabetes mellitus with diabetic polyneuropathy: Secondary | ICD-10-CM

## 2018-01-12 MED ORDER — DULAGLUTIDE 1.5 MG/0.5ML ~~LOC~~ SOAJ: 1.5000 mg | SUBCUTANEOUS | 11 refills | Status: DC

## 2018-01-12 NOTE — Progress Notes (Signed)
Patient ID: Michael Guzman, male   DOB: Jul 08, 1946, 72 y.o.   MRN: [id number]    HPI  Michael Guzman is a 72 y.o.-year-old male, initially referred by his PCP, Dr. Melford Aase, for evaluation for low TSH and thyroiditis, and now also DM2, dx few years ago, non-insulin-dependent, uncontrolled, with complications (+ PN, CKD).  Last visit 2 months ago. Last OV 2 mo ago.  He had recent steroid courses and injections for back pain.  DM2: Last hemoglobin A1c was: Lab Results  Component Value Date   HGBA1C 12.5 (H) 11/04/2017   HGBA1C >14.0 (H) 08/04/2017   HGBA1C 10.0 (H) 05/03/2017   He was on: - Metformin ER 1000 mg 2x a day, with meals - Novolin 70/30 40-15-40 units with meals, last dose increased by PCP  He is now on: - Metformin ER 1000 mg 2x a day with meals - Trulicity 6.62 mg weekly - started 11/2017 - N and R insulins: Insulin Before breakfast Before lunch Before dinner  Regular 35 25 35  NPH 40  35  Please inject the insulin 30 min before meals.  Pt checks his sugars 2-3 times a day: Per log, sugars are better: - am: 300-547 >> 272-451 >> 253-410, 440 >> 147, 178-325 - 2h after b'fast: n/c >> 399-540 >> n/c >> 227, 228 - before lunch: n/c >> 320-390 >> n/c - 2h after lunch: n/c - before dinner: n/c >> 292-579 >> n/c - 2h after dinner: n/c >> 437-505 >> 372 - bedtime: >300-580 >> 490 >> n/c - nighttime: n/c Lowest sugar was 310 >> 272 >> 253 ; Unclear at which level he has hypoglycemia awareness. Highest sugar was up to 580 >> HI >> 440.  Glucometer: One Touch Ultra  He saw nutrition.Pt's meals are: - Breakfast: eggs, bacon, toast - Lunch: smallest meal - Dinner: larger meal  -+ Mild CKD, last BUN/creatinine:  Lab Results  Component Value Date   BUN 10 11/04/2017   BUN 13 08/04/2017   CREATININE 0.88 11/04/2017   CREATININE 0.96 08/04/2017   - + HL; last set of lipids: Lab Results  Component Value Date   CHOL 104 11/04/2017   HDL 39 (L) 11/04/2017    LDLCALC 34 11/04/2017   LDLDIRECT 45.7 10/30/2013   TRIG 265 (H) 11/04/2017   CHOLHDL 2.7 11/04/2017  On Lipitor 20+ omega-3 fatty acids  - last eye exam was in 03/2017: Reportedly no DR  - + numbness and tingling in his feet.  He is on gabapentin 1800 mg daily On ASA 81.  Thyroiditis:  He has a long history of low TSH, but TFTs normalized in the last year.  Reviewed the reports of his previous studies: A thyroid uptake and scan (03/27/2016) returned with uniform scan and suppressed uptake: Slightly decreased 24 hour radio iodine uptake at 8.4%. 09/02/2012  1.  Low normal (11%) 24 hour radioiodine uptake by the thyroid gland. Uptake on the prior studies was 31% and 27%. 2.  Stable scan with fairly homogeneous activity.  No focal hot nodules identified. 04/08/2012: The thyroid scan demonstrates fairly homogeneous uptake in the thyroid gland.  A pyramidal lobe is again demonstrated.  No hot or cold lesions are identified.  Normal uptake in the salivary glands.The 24 hour I-131 uptake was calculated at 26.6%.  Normal is 10-35%. 10/27/2011: The 24 hour radioactive iodine uptake is upper limits of normal at 31.1%.  These findings may represent early Graves' disease or subclinical hyperthyroidism. Radiotracer uptake within the pyramidal  lobe is noted. 07/11/2010: 24 are uptake remains within normal range of 18.9%.  The pertechnetate scan remains normal and unchanged in appearance. 04/26/2009: 24 hour I 131 uptake = 12 % (normal 10-30%). Essentially normal thyroid imaging and 24 prior uptake.  With a slightly depressed TSH, consider subacute thyroiditis or potentially early Grave's disease.  His TFTs normalized recently: Lab Results  Component Value Date   TSH 0.56 11/04/2017   TSH 0.61 08/04/2017   TSH 0.50 05/03/2017   TSH 0.49 01/15/2017   TSH 0.50 01/01/2017   TSH 0.39 (L) 12/03/2016   TSH 0.41 10/20/2016   TSH 0.44 06/23/2016   TSH 0.29 (L) 05/12/2016   TSH 0.39 (L)  03/10/2016   FREET4 0.60 05/12/2016   FREET4 1.02 06/26/2012    Pt denies: - feeling nodules in neck - hoarseness - dysphagia - choking - SOB with lying down  Pt was adopted >> unclear if FH of thyroid ds. No FH of thyroid cancer. No h/o radiation tx to head or neck.  No seaweed or kelp. No recent contrast studies. No herbal supplements. No Biotin use. No recent steroids use.  Pt. also has a history of prostate CA, colon CA, HTN, GERD.  ROS: Constitutional: no weight gain/no weight loss, no fatigue, no subjective hyperthermia, no subjective hypothermia Eyes: no blurry vision, no xerophthalmia ENT: no sore throat, + see HPI Cardiovascular: no CP/no SOB/no palpitations/no leg swelling Respiratory: no cough/no SOB/no wheezing Gastrointestinal: no N/no V/no D/no C/no acid reflux Musculoskeletal: no muscle aches/no joint aches Skin: no rashes, no hair loss Neurological: no tremors/+ numbness/+ tingling/no dizziness  I reviewed pt's medications, allergies, PMH, social hx, family hx, and changes were documented in the history of present illness. Otherwise, unchanged from my initial visit note.   Past Medical History:  Diagnosis Date  . Arthritis   . CAD (coronary artery disease)   . Chronic back pain   . Chronically dry eyes   . Diabetes mellitus without complication ([place])   . Elevated PSA    being monitored  . GERD (gastroesophageal reflux disease)   . Hypertension   . Prostate cancer [facility])    Past Surgical History:  Procedure Laterality Date  . APPENDECTOMY    . CARDIAC SURGERY     3 stent placed.  . COLONOSCOPY  02/12/2010   [place]  . LEFT HEART CATHETERIZATION WITH CORONARY ANGIOGRAM N/A 06/27/2012   Procedure: LEFT HEART CATHETERIZATION WITH CORONARY ANGIOGRAM;  Surgeon: Candee Furbish, MD;  Location: [facility] CATH LAB;  Service: Cardiovascular;  Laterality: N/A;  . LEFT HEART CATHETERIZATION WITH CORONARY ANGIOGRAM N/A 07/25/2012   Procedure: LEFT HEART CATHETERIZATION  WITH CORONARY ANGIOGRAM;  Surgeon: Sueanne Margarita, MD;  Location: [place] CATH LAB;  Service: Cardiovascular;  Laterality: N/A;  . PROSTATE BIOPSY    . RADIOLOGY WITH ANESTHESIA N/A 12/03/2015   Procedure: MRI LUMBAR SPINE;  Surgeon: Medication Radiologist, MD;  Location: [place];  Service: Radiology;  Laterality: N/A;   Social History   Social History  . Marital status: Married    Spouse name: N/A  . Number of children: 4  . Years of education: 2 y colleg   Occupational History  . 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 Smoking status: Former Smoker    Years: 50.00    Quit date: 07/27/2011  . Smokeless tobacco: Never Used  . Alcohol use Yes     Comment: occasional  . Drug use: No   Current Outpatient Medications  on File Prior to Visit  Medication Sig Dispense Refill  . aspirin EC 81 MG tablet Take 81 mg by mouth daily.    Marland Kitchen atorvastatin (LIPITOR) 20 MG tablet TAKE 1 TABLET BY MOUTH  DAILY 90 tablet 3  . Blood Glucose Monitoring Suppl (ONE TOUCH ULTRA SYSTEM KIT) w/Device KIT Check blood sugar 1 time daily-DX-E11.22 1 each 0  . buPROPion (WELLBUTRIN XL) 150 MG 24 hr tablet Take 1 tablet (150 mg total) by mouth every morning. 30 tablet 2  . Cholecalciferol (VITAMIN D3) 5000 UNITS CAPS Take 5,000 Units by mouth 2 (two) times daily.    . Dulaglutide (TRULICITY) 1.60 FU/9.3AT SOPN Inject 0.75 mg weekly under skin 4 pen 1  . fluticasone (FLONASE) 50 MCG/ACT nasal spray Place 2 sprays into both nostrils daily. 16 g 0  . gabapentin (NEURONTIN) 800 MG tablet Take 1 tablet 3 to 4 x / day for pain, hot flashes & sweats 360 tablet 1  . glucose blood (ONE TOUCH ULTRA TEST) test strip CHECK BLOOD SUGAR 2 TIMES  DAILY 100 each 2  . hydrocortisone (ANUSOL-HC) 25 MG suppository Place 25 mg rectally 2 (two) times daily as needed for hemorrhoids or itching.    . Magnesium 250 MG TABS Take 250 mg by mouth daily.    . megestrol (MEGACE) 20 MG tablet Take 20 mg by mouth 2 (two) times  daily.    . metFORMIN (GLUCOPHAGE-XR) 500 MG 24 hr tablet TAKE 1 TO 2 TABLETS BY  MOUTH TWO TIMES DAILY AS  DIRECTED FOR DIABETES 360 tablet 3  . metoprolol succinate (TOPROL-XL) 25 MG 24 hr tablet TAKE ONE-HALF TABLET BY  MOUTH DAILY. TAKE WITH OR  IMMEDIATELY FOLLOWING A  MEAL. 45 tablet 3  . montelukast (SINGULAIR) 10 MG tablet TAKE 1 TABLET BY MOUTH AT  BEDTIME 90 tablet 1  . Multiple Vitamin (MULTIVITAMIN WITH MINERALS) TABS tablet Take 1 tablet by mouth daily.    . nitroGLYCERIN (NITROSTAT) 0.4 MG SL tablet PLACE ONE TABLET UNDER THE TONGUE EVERY 5 MINUTES AS NEEDED FOR CHEST PAIN. 25 tablet 6  . Omega-3 Fatty Acids (FISH OIL) 1000 MG CAPS Take 1,000 mg by mouth daily.     . pantoprazole (PROTONIX) 40 MG tablet TAKE 1 TABLET BY MOUTH  DAILY 90 tablet 1  . promethazine-dextromethorphan (PROMETHAZINE-DM) 6.25-15 MG/5ML syrup Take 1 to 2 tsp enery 4 hours if needed for cough 360 mL 1  . ranitidine (ZANTAC) 150 MG tablet Take 1 tablet (150 mg total) by mouth 2 (two) times daily. 180 tablet 1  . tamsulosin (FLOMAX) 0.4 MG CAPS capsule Take 0.4 mg by mouth at bedtime.     No current facility-administered medications on file prior to visit.    No Known Allergies Family History  Problem Relation Age of Onset  . Diabetes Sister    PE: BP 122/80   Pulse 80   Ht '5\' 10"'$  (1.778 m)   Wt 222 lb 6.4 oz (100.9 kg)   SpO2 97%   BMI 31.91 kg/m  Wt Readings from Last 3 Encounters:  01/12/18 222 lb 6.4 oz (100.9 kg)  11/22/17 215 lb 12.8 oz (97.9 kg)  11/19/17 215 lb (97.5 kg)   Constitutional: overweight, in NAD Eyes: PERRLA, EOMI, no exophthalmos ENT: moist mucous membranes, no thyromegaly, no cervical lymphadenopathy Cardiovascular: RRR, No MRG Respiratory: CTA B Gastrointestinal: abdomen soft, NT, ND, BS+ Musculoskeletal: no deformities, strength intact in all 4 Skin: moist, warm, no rashes Neurological: no tremor with outstretched hands,  DTR normal in all 4  ASSESSMENT: 1. Low TSH  2/2 Thyroiditis  2. DM2  PLAN:  1. Patient with a history of low TSH secondary to thyroiditis, diagnosed in 2010, with fluctuating TFTs since then, and a slightly low TSH, however, in the last year, his TFTs normalized, including at last check in 10/2017.  He had several uptake and scan tests over the years with diverse results: Normal, Graves' disease, thyroiditis (?).  Latest uptake and scan showed decreased iodine uptake and homogeneous scan, but this was performed 2 months after CT with contrast with could have influenced the results. - He denies symptoms that could be attributed to thyrotoxicosis  - we will recheck his TFTs at next visit  2. DM2  - patient with very uncontrolled diabetes, with extremely high sugars at last visit, now much improved after we increased his insulin doses and we added Trulicity.  He is still on the lower dose of Trulicity and his sugars are still high, but there is a definite improvement of almost 50% decrease in CBGs.  Unfortunately, he only checks sugars in the morning and I advised him to also check later in the day. - At this visit, since he is tolerating Trulicity well, will increase the dose to the target dose of 1.5 mg weekly. - Recent HbA1c was reviewed today with the patient and this was 12.5%, improved, but still extremely high  - I suggested to:  Patient Instructions   Please continue: - Metformin ER 1000 mg 2x a day with meals - N and R insulins: Insulin Before breakfast Before lunch Before dinner  Regular 35 25 35  NPH 40  35  Please inject the insulin 30 min before meals.  Please increase: - Trulicity to 1.5 mg weekly  Please return in 3 months with your sugar log.   - continue checking sugars at different times of the day - check 3x a day, rotating checks - advised for yearly eye exams >> he is UTD - Return to clinic in 3 mo with sugar log    Philemon Kingdom, MD PhD [facility] Endocrinology

## 2018-01-12 NOTE — Patient Instructions (Addendum)
Please continue: - Metformin ER 1000 mg 2x a day with meals - N and R insulins: Insulin Before breakfast Before lunch Before dinner  Regular 35 25 35  NPH 40  35  Please inject the insulin 30 min before meals.  Please increase: - Trulicity to 1.5 mg weekly  Please return in 3 months with your sugar log.

## 2018-01-15 ENCOUNTER — Emergency Department (HOSPITAL_COMMUNITY)
Admission: EM | Admit: 2018-01-15 | Discharge: 2018-01-16 | Disposition: A | Discharge status: Home / Self Care | Payer: Medicare Other | Attending: Emergency Medicine | Admitting: Emergency Medicine

## 2018-01-15 ENCOUNTER — Other Ambulatory Visit: Payer: Self-pay | Admitting: Internal Medicine

## 2018-01-15 ENCOUNTER — Other Ambulatory Visit: Payer: Self-pay

## 2018-01-15 ENCOUNTER — Encounter: Payer: Self-pay | Admitting: Internal Medicine

## 2018-01-15 DIAGNOSIS — I10 Essential (primary) hypertension: Secondary | ICD-10-CM | POA: Insufficient documentation

## 2018-01-15 DIAGNOSIS — R609 Edema, unspecified: Secondary | ICD-10-CM | POA: Diagnosis not present

## 2018-01-15 DIAGNOSIS — Z7982 Long term (current) use of aspirin: Secondary | ICD-10-CM | POA: Insufficient documentation

## 2018-01-15 DIAGNOSIS — R600 Localized edema: Secondary | ICD-10-CM | POA: Diagnosis not present

## 2018-01-15 DIAGNOSIS — I2510 Atherosclerotic heart disease of native coronary artery without angina pectoris: Secondary | ICD-10-CM | POA: Insufficient documentation

## 2018-01-15 DIAGNOSIS — Z79899 Other long term (current) drug therapy: Secondary | ICD-10-CM | POA: Insufficient documentation

## 2018-01-15 DIAGNOSIS — Z87891 Personal history of nicotine dependence: Secondary | ICD-10-CM | POA: Insufficient documentation

## 2018-01-15 DIAGNOSIS — E119 Type 2 diabetes mellitus without complications: Secondary | ICD-10-CM | POA: Insufficient documentation

## 2018-01-15 DIAGNOSIS — Z7984 Long term (current) use of oral hypoglycemic drugs: Secondary | ICD-10-CM | POA: Insufficient documentation

## 2018-01-15 DIAGNOSIS — J811 Chronic pulmonary edema: Secondary | ICD-10-CM | POA: Diagnosis not present

## 2018-01-15 LAB — URINALYSIS, ROUTINE W REFLEX MICROSCOPIC
Bilirubin Urine: NEGATIVE
GLUCOSE, UA: 50 mg/dL — AB
HGB URINE DIPSTICK: NEGATIVE
Ketones, ur: 5 mg/dL — AB
Leukocytes, UA: NEGATIVE
Nitrite: NEGATIVE
Protein, ur: NEGATIVE mg/dL
SPECIFIC GRAVITY, URINE: 1.013 (ref 1.005–1.030)
pH: 5 (ref 5.0–8.0)

## 2018-01-15 LAB — CBC
HCT: 37 % — ABNORMAL LOW (ref 39.0–52.0)
HEMOGLOBIN: 12.1 g/dL — AB (ref 13.0–17.0)
MCH: 29.7 pg (ref 26.0–34.0)
MCHC: 32.7 g/dL (ref 30.0–36.0)
MCV: 90.7 fL (ref 78.0–100.0)
Platelets: 177 10*3/uL (ref 150–400)
RBC: 4.08 MIL/uL — AB (ref 4.22–5.81)
RDW: 13.7 % (ref 11.5–15.5)
WBC: 4.6 10*3/uL (ref 4.0–10.5)

## 2018-01-15 LAB — COMPREHENSIVE METABOLIC PANEL
ALBUMIN: 3.8 g/dL (ref 3.5–5.0)
ALK PHOS: 87 U/L (ref 38–126)
ALT: 58 U/L (ref 17–63)
ANION GAP: 14 (ref 5–15)
AST: 49 U/L — ABNORMAL HIGH (ref 15–41)
BUN: 12 mg/dL (ref 6–20)
CALCIUM: 9.5 mg/dL (ref 8.9–10.3)
CHLORIDE: 105 mmol/L (ref 101–111)
CO2: 20 mmol/L — AB (ref 22–32)
Creatinine, Ser: 1.02 mg/dL (ref 0.61–1.24)
GFR calc non Af Amer: >60 mL/min (ref >60)
GLUCOSE: 198 mg/dL — AB (ref 65–99)
POTASSIUM: 4 mmol/L (ref 3.5–5.1)
Sodium: 139 mmol/L (ref 135–145)
Total Bilirubin: 0.8 mg/dL (ref 0.3–1.2)
Total Protein: 6.4 g/dL — ABNORMAL LOW (ref 6.5–8.1)

## 2018-01-15 LAB — LIPASE, BLOOD: LIPASE: 24 U/L (ref 11–51)

## 2018-01-15 MED ORDER — FUROSEMIDE 40 MG PO TABS: ORAL_TABLET | ORAL | 1 refills | Status: DC

## 2018-01-15 NOTE — ED Triage Notes (Addendum)
Pt seen his endocrinologist on Thursday and she noticed  Mild swelling to L ankle. Pt reports since that visit, swelling in both ankle has become worse with itching, no pain. Pt received an epidural two weeks ago for back and hip pain and is due for another one on Wednesday. Pt also reports abd pain that has increased over the past few days with decreased urinary output

## 2018-01-16 ENCOUNTER — Other Ambulatory Visit: Payer: Self-pay | Admitting: Internal Medicine

## 2018-01-16 ENCOUNTER — Emergency Department (HOSPITAL_COMMUNITY): Payer: Medicare Other

## 2018-01-16 DIAGNOSIS — J811 Chronic pulmonary edema: Secondary | ICD-10-CM | POA: Diagnosis not present

## 2018-01-16 MED ORDER — FUROSEMIDE 20 MG PO TABS: ORAL_TABLET | ORAL | 0 refills | Status: DC

## 2018-01-16 MED ORDER — OXYCODONE-ACETAMINOPHEN 5-325 MG PO TABS
1.0000 | ORAL_TABLET | Freq: Once | ORAL | Status: AC
  Administered 2018-01-16: 1 via ORAL
  Filled 2018-01-16: qty 1

## 2018-01-16 MED ORDER — FUROSEMIDE 40 MG PO TABS: ORAL_TABLET | ORAL | 1 refills | Status: DC

## 2018-01-16 MED ORDER — FUROSEMIDE 10 MG/ML IJ SOLN
20.0000 mg | Freq: Once | INTRAMUSCULAR | Status: AC
  Administered 2018-01-16: 20 mg via INTRAVENOUS
  Filled 2018-01-16: qty 2

## 2018-01-16 NOTE — ED Notes (Signed)
Pt understood dc material. NAD noted.l Script given at [employer]

## 2018-01-16 NOTE — ED Notes (Signed)
Bladder scanner resulted in 91 mL of urine in bladder.

## 2018-01-16 NOTE — ED Provider Notes (Signed)
[facility] EMERGENCY DEPARTMENT Provider Note   CSN: [id number] Arrival date & time: 01/15/18  2032     History   Chief Complaint Chief Complaint  Patient presents with  . Abdominal Pain  . Leg Swelling    HPI Michael Guzman is a 72 y.o. male.  Patient with a history of prostate CA (last treatment 08/2017), DM, HLD, HTN, CAD (PCI w/stents 2013), chronic back pain presents today for concern of lower extremity edema that was noticed today by family members. No SOB, CP, abdominal discomfort. He denies history of CHF, diuretic use or similar edema in the past. No pain in LE's.   The history is provided by the patient and the spouse. No language interpreter was used.  Abdominal Pain   Pertinent negatives include fever, nausea and vomiting.    Past Medical History:  Diagnosis Date  . Arthritis   . CAD (coronary artery disease)   . Chronic back pain   . Chronically dry eyes   . Diabetes mellitus without complication ([place])   . Elevated PSA    being monitored  . GERD (gastroesophageal reflux disease)   . Hypertension   . Prostate cancer [facility])     Patient Active Problem List   Diagnosis Date Noted  . Personal history of prostate cancer 11/04/2017  . Obesity (BMI 30.0-34.9) 11/03/2017  . DKA (diabetic ketoacidoses) ([place]) 01/30/2017  . Thyroiditis 05/12/2016  . Malignant neoplasm of prostate ([place]) 05/05/2016  . Diabetic peripheral neuropathy ([place]) 03/06/2015  . Medication management 07/24/2014  . Vitamin D deficiency 03/06/2014  . Mixed hyperlipidemia 06/28/2012  . ASCAD s/p PTCA (06/2012) 06/27/2012  . Hypertension (1991) 06/25/2012  . Poorly controlled type 2 diabetes mellitus with peripheral neuropathy ([place]) 06/25/2012  . GERD 06/25/2012    Past Surgical History:  Procedure Laterality Date  . APPENDECTOMY    . CARDIAC SURGERY     3 stent placed.  . COLONOSCOPY  02/12/2010   Buccini  . LEFT HEART CATHETERIZATION WITH CORONARY ANGIOGRAM N/A  06/27/2012   Procedure: LEFT HEART CATHETERIZATION WITH CORONARY ANGIOGRAM;  Surgeon: Candee Furbish, MD;  Location: [facility] CATH LAB;  Service: Cardiovascular;  Laterality: N/A;  . LEFT HEART CATHETERIZATION WITH CORONARY ANGIOGRAM N/A 07/25/2012   Procedure: LEFT HEART CATHETERIZATION WITH CORONARY ANGIOGRAM;  Surgeon: Sueanne Margarita, MD;  Location: [place] CATH LAB;  Service: Cardiovascular;  Laterality: N/A;  . PROSTATE BIOPSY    . RADIOLOGY WITH ANESTHESIA N/A 12/03/2015   Procedure: MRI LUMBAR SPINE;  Surgeon: Medication Radiologist, MD;  Location: [place];  Service: Radiology;  Laterality: N/A;        Home Medications    Prior to Admission medications   Medication Sig Start Date End Date Taking? Authorizing Provider  aspirin EC 81 MG tablet Take 81 mg by mouth daily.   Yes [provider]  atorvastatin (LIPITOR) 20 MG tablet TAKE 1 TABLET BY MOUTH  DAILY 12/02/17  Yes Jerline Pain, MD  buPROPion (WELLBUTRIN XL) 150 MG 24 hr tablet Take 1 tablet (150 mg total) by mouth every morning. Patient taking differently: Take 150 mg by mouth daily.  11/19/17 11/19/18 Yes Vicie Mutters, PA-C  Cholecalciferol (VITAMIN D3) 5000 UNITS CAPS Take 5,000 Units by mouth 2 (two) times daily.   Yes [provider]  Dulaglutide (TRULICITY) 1.71 YO/3.7CH SOPN Inject 1.5 mg into the skin once a week. 01/12/18  Yes Philemon Kingdom, MD  fluticasone (FLONASE) 50 MCG/ACT nasal spray Place 2 sprays into both nostrils  daily. 04/23/16  Yes Forcucci, Courtney, PA-C  furosemide (LASIX) 40 MG tablet Take 1 tablet 2 x / day as needed for fluid retention Patient taking differently: Take 20 mg by mouth 2 (two) times daily as needed for fluid.  01/15/18  Yes [name], MD  gabapentin (NEURONTIN) 800 MG tablet Take 1 tablet 3 to 4 x / day for pain, hot flashes & sweats Patient taking differently: Take 1,600 mg by mouth 2 (two) times daily.  09/10/17  Yes [name], MD  Magnesium 250 MG TABS Take 250 mg by mouth  daily.   Yes [provider]  metFORMIN (GLUCOPHAGE-XR) 500 MG 24 hr tablet TAKE 1 TO 2 TABLETS BY  MOUTH TWO TIMES DAILY AS  DIRECTED FOR DIABETES Patient taking differently: Take 1,000 mg by mouth 2 (two) times daily.  09/10/17  Yes Vicie Mutters, PA-C  metoprolol succinate (TOPROL-XL) 25 MG 24 hr tablet TAKE ONE-HALF TABLET BY  MOUTH DAILY. TAKE WITH OR  IMMEDIATELY FOLLOWING A  MEAL. 11/23/17  Yes Jerline Pain, MD  montelukast (SINGULAIR) 10 MG tablet TAKE 1 TABLET BY MOUTH AT  BEDTIME 09/10/17  Yes [name], MD  Multiple Vitamin (MULTIVITAMIN WITH MINERALS) TABS tablet Take 1 tablet by mouth daily.   Yes [provider]  nitroGLYCERIN (NITROSTAT) 0.4 MG SL tablet PLACE ONE TABLET UNDER THE TONGUE EVERY 5 MINUTES AS NEEDED FOR CHEST PAIN. 12/18/16  Yes [name], MD  Omega-3 Fatty Acids (FISH OIL) 1000 MG CAPS Take 1,000 mg by mouth daily.    Yes [provider]  pantoprazole (PROTONIX) 40 MG tablet TAKE 1 TABLET BY MOUTH  DAILY 12/01/17  Yes [name], MD  ranitidine (ZANTAC) 150 MG tablet Take 1 tablet (150 mg total) by mouth 2 (two) times daily. 11/04/17 11/04/18 Yes Liane Comber, NP  tamsulosin (FLOMAX) 0.4 MG CAPS capsule Take 0.4 mg by mouth at bedtime.   Yes [provider]  tiZANidine (ZANAFLEX) 4 MG tablet Take 4 mg by mouth 2 (two) times daily. 01/14/18  Yes [provider]  Blood Glucose Monitoring Suppl (ONE TOUCH ULTRA SYSTEM KIT) w/Device KIT Check blood sugar 1 time daily-DX-E11.22 04/09/16   [name], MD  glucose blood (ONE TOUCH ULTRA TEST) test strip CHECK BLOOD SUGAR 2 TIMES  DAILY 09/10/17   Vicie Mutters, PA-C  promethazine-dextromethorphan (PROMETHAZINE-DM) 6.25-15 MG/5ML syrup Take 1 to 2 tsp enery 4 hours if needed for cough Patient not taking: Reported on 01/16/2018 09/10/17   [name], MD    Family History Family History  Problem Relation Age of Onset  . Diabetes Sister     Social  History Social History   Tobacco Use  . Smoking status: Former Smoker    Years: 50.00    Last attempt to quit: 07/27/2011    Years since quitting: 6.4  . Smokeless tobacco: Never Used  Substance Use Topics  . Alcohol use: Yes    Comment: occasional  . Drug use: No     Allergies   Patient has no known allergies.   Review of Systems Review of Systems  Constitutional: Negative for chills and fever.  Respiratory: Negative.  Negative for shortness of breath.   Cardiovascular: Positive for leg swelling. Negative for chest pain.  Gastrointestinal: Negative for nausea and vomiting.  Genitourinary: Negative.   Musculoskeletal: Negative.   Skin: Negative.   Neurological: Negative.  Negative for weakness.     Physical Exam Updated Vital Signs BP (!) 142/77 (BP Location: Right Arm)  Pulse 78   Temp 100 F (37.8 C) (Oral)   Resp 18   SpO2 100%   Physical Exam  Constitutional: He is oriented to person, place, and time. He appears well-developed and well-nourished.  HENT:  Head: Normocephalic.  Neck: Normal range of motion. Neck supple. No JVD present.  Cardiovascular: Normal rate, regular rhythm and intact distal pulses.  No murmur heard. Pulmonary/Chest: Effort normal and breath sounds normal. He has no wheezes. He has no rhonchi. He has no rales.  Abdominal: Soft. Bowel sounds are normal. There is no tenderness. There is no rebound and no guarding.  Musculoskeletal: Normal range of motion.  1+ edema to bilateral lower extremities. Nontender.   Neurological: He is alert and oriented to person, place, and time.  Skin: Skin is warm and dry. No rash noted.  Psychiatric: He has a normal mood and affect.     ED Treatments / Results  Labs (all labs ordered are listed, but only abnormal results are displayed) Labs Reviewed  COMPREHENSIVE METABOLIC PANEL - Abnormal; Notable for the following components:      Result Value   CO2 20 (*)    Glucose, Bld 198 (*)    Total  Protein 6.4 (*)    AST 49 (*)    All other components within normal limits  CBC - Abnormal; Notable for the following components:   RBC 4.08 (*)    Hemoglobin 12.1 (*)    HCT 37.0 (*)    All other components within normal limits  URINALYSIS, ROUTINE W REFLEX MICROSCOPIC - Abnormal; Notable for the following components:   Glucose, UA 50 (*)    Ketones, ur 5 (*)    All other components within normal limits  LIPASE, BLOOD    EKG None  Radiology No results found.  Procedures Procedures (including critical care time)  Medications Ordered in ED Medications  oxyCODONE-acetaminophen (PERCOCET/ROXICET) 5-325 MG per tablet 1 tablet (has no administration in time range)     Initial Impression / Assessment and Plan / ED Course  I have reviewed the triage vital signs and the nursing notes.  Pertinent labs & imaging results that were available during my care of the patient were reviewed by me and considered in my medical decision making (see chart for details).     Patient presents for concern of peripheral edema noticed by family members yesterday. No CP, SoB. He denies diuretic use although his pharmacy list includes prn lasix. He complains of back pain that is chronic. Pain addressed in the ED.   Labs, EKG and CXR are essentially negative. Single dose IV lasix provided. He is evaluated by Dr. Wyvonnia Dusky and found appropriate for discharge home.       Final Clinical Impressions(s) / ED Diagnoses   Final diagnoses:  None   1. Peripheral edema  ED Discharge Orders    None       Charlann Lange, PA-C 01/20/18 1994    Ezequiel Essex, MD 01/20/18 1013

## 2018-01-16 NOTE — ED Notes (Signed)
Pt O2 was 100% while ambulating

## 2018-01-19 DIAGNOSIS — M47816 Spondylosis without myelopathy or radiculopathy, lumbar region: Secondary | ICD-10-CM | POA: Diagnosis not present

## 2018-01-31 ENCOUNTER — Emergency Department (HOSPITAL_COMMUNITY): Payer: Medicare Other

## 2018-01-31 ENCOUNTER — Observation Stay (HOSPITAL_COMMUNITY)
Admission: EM | Admit: 2018-01-31 | Discharge: 2018-02-02 | Disposition: A | Discharge status: Home / Self Care | Payer: Medicare Other | Attending: Internal Medicine | Admitting: Internal Medicine

## 2018-01-31 ENCOUNTER — Encounter (HOSPITAL_COMMUNITY): Payer: Self-pay

## 2018-01-31 DIAGNOSIS — E559 Vitamin D deficiency, unspecified: Secondary | ICD-10-CM | POA: Diagnosis not present

## 2018-01-31 DIAGNOSIS — R531 Weakness: Secondary | ICD-10-CM | POA: Diagnosis not present

## 2018-01-31 DIAGNOSIS — E782 Mixed hyperlipidemia: Secondary | ICD-10-CM | POA: Diagnosis not present

## 2018-01-31 DIAGNOSIS — I10 Essential (primary) hypertension: Secondary | ICD-10-CM | POA: Diagnosis present

## 2018-01-31 DIAGNOSIS — E069 Thyroiditis, unspecified: Secondary | ICD-10-CM | POA: Insufficient documentation

## 2018-01-31 DIAGNOSIS — R61 Generalized hyperhidrosis: Secondary | ICD-10-CM

## 2018-01-31 DIAGNOSIS — D649 Anemia, unspecified: Secondary | ICD-10-CM | POA: Insufficient documentation

## 2018-01-31 DIAGNOSIS — M159 Polyosteoarthritis, unspecified: Secondary | ICD-10-CM

## 2018-01-31 DIAGNOSIS — M150 Primary generalized (osteo)arthritis: Secondary | ICD-10-CM

## 2018-01-31 DIAGNOSIS — E669 Obesity, unspecified: Secondary | ICD-10-CM | POA: Diagnosis not present

## 2018-01-31 DIAGNOSIS — Z87891 Personal history of nicotine dependence: Secondary | ICD-10-CM | POA: Diagnosis not present

## 2018-01-31 DIAGNOSIS — E1151 Type 2 diabetes mellitus with diabetic peripheral angiopathy without gangrene: Secondary | ICD-10-CM | POA: Diagnosis not present

## 2018-01-31 DIAGNOSIS — Z7984 Long term (current) use of oral hypoglycemic drugs: Secondary | ICD-10-CM | POA: Insufficient documentation

## 2018-01-31 DIAGNOSIS — M1990 Unspecified osteoarthritis, unspecified site: Secondary | ICD-10-CM | POA: Insufficient documentation

## 2018-01-31 DIAGNOSIS — I2510 Atherosclerotic heart disease of native coronary artery without angina pectoris: Secondary | ICD-10-CM

## 2018-01-31 DIAGNOSIS — Z6830 Body mass index (BMI) 30.0-30.9, adult: Secondary | ICD-10-CM | POA: Insufficient documentation

## 2018-01-31 DIAGNOSIS — C61 Malignant neoplasm of prostate: Secondary | ICD-10-CM | POA: Insufficient documentation

## 2018-01-31 DIAGNOSIS — G8929 Other chronic pain: Secondary | ICD-10-CM | POA: Insufficient documentation

## 2018-01-31 DIAGNOSIS — R4020 Unspecified coma: Secondary | ICD-10-CM | POA: Diagnosis not present

## 2018-01-31 DIAGNOSIS — Z7982 Long term (current) use of aspirin: Secondary | ICD-10-CM | POA: Insufficient documentation

## 2018-01-31 DIAGNOSIS — Z7951 Long term (current) use of inhaled steroids: Secondary | ICD-10-CM | POA: Diagnosis not present

## 2018-01-31 DIAGNOSIS — E1159 Type 2 diabetes mellitus with other circulatory complications: Secondary | ICD-10-CM | POA: Diagnosis not present

## 2018-01-31 DIAGNOSIS — M8949 Other hypertrophic osteoarthropathy, multiple sites: Secondary | ICD-10-CM

## 2018-01-31 DIAGNOSIS — M549 Dorsalgia, unspecified: Secondary | ICD-10-CM | POA: Diagnosis not present

## 2018-01-31 DIAGNOSIS — Z9861 Coronary angioplasty status: Secondary | ICD-10-CM

## 2018-01-31 DIAGNOSIS — R55 Syncope and collapse: Secondary | ICD-10-CM | POA: Diagnosis not present

## 2018-01-31 DIAGNOSIS — R5383 Other fatigue: Secondary | ICD-10-CM | POA: Diagnosis not present

## 2018-01-31 DIAGNOSIS — K219 Gastro-esophageal reflux disease without esophagitis: Secondary | ICD-10-CM | POA: Insufficient documentation

## 2018-01-31 DIAGNOSIS — R918 Other nonspecific abnormal finding of lung field: Secondary | ICD-10-CM | POA: Diagnosis not present

## 2018-01-31 DIAGNOSIS — Z955 Presence of coronary angioplasty implant and graft: Secondary | ICD-10-CM | POA: Insufficient documentation

## 2018-01-31 LAB — COMPREHENSIVE METABOLIC PANEL
ALBUMIN: 4.1 g/dL (ref 3.5–5.0)
ALT: 40 U/L (ref 17–63)
AST: 31 U/L (ref 15–41)
Alkaline Phosphatase: 73 U/L (ref 38–126)
Anion gap: 13 (ref 5–15)
BUN: 15 mg/dL (ref 6–20)
CHLORIDE: 101 mmol/L (ref 101–111)
CO2: 22 mmol/L (ref 22–32)
Calcium: 9.4 mg/dL (ref 8.9–10.3)
Creatinine, Ser: 1.15 mg/dL (ref 0.61–1.24)
GFR calc Af Amer: >60 mL/min (ref >60)
GFR calc non Af Amer: >60 mL/min (ref >60)
GLUCOSE: 154 mg/dL — AB (ref 65–99)
POTASSIUM: 4 mmol/L (ref 3.5–5.1)
Sodium: 136 mmol/L (ref 135–145)
Total Bilirubin: 0.6 mg/dL (ref 0.3–1.2)
Total Protein: 6.8 g/dL (ref 6.5–8.1)

## 2018-01-31 LAB — I-STAT CHEM 8, ED
BUN: 18 mg/dL (ref 6–20)
CALCIUM ION: 1.13 mmol/L — AB (ref 1.15–1.40)
CHLORIDE: 102 mmol/L (ref 101–111)
Creatinine, Ser: 1 mg/dL (ref 0.61–1.24)
Glucose, Bld: 156 mg/dL — ABNORMAL HIGH (ref 65–99)
HEMATOCRIT: 40 % (ref 39.0–52.0)
Hemoglobin: 13.6 g/dL (ref 13.0–17.0)
Potassium: 4.1 mmol/L (ref 3.5–5.1)
Sodium: 137 mmol/L (ref 135–145)
TCO2: 25 mmol/L (ref 22–32)

## 2018-01-31 LAB — DIFFERENTIAL
ABS IMMATURE GRANULOCYTES: 0.1 10*3/uL (ref 0.0–0.1)
BASOS PCT: 1 %
Basophils Absolute: 0 10*3/uL (ref 0.0–0.1)
EOS ABS: 0.2 10*3/uL (ref 0.0–0.7)
Eosinophils Relative: 3 %
IMMATURE GRANULOCYTES: 2 %
Lymphocytes Relative: 16 %
Lymphs Abs: 0.9 10*3/uL (ref 0.7–4.0)
Monocytes Absolute: 0.7 10*3/uL (ref 0.1–1.0)
Monocytes Relative: 13 %
NEUTROS ABS: 3.6 10*3/uL (ref 1.7–7.7)
Neutrophils Relative %: 65 %

## 2018-01-31 LAB — CBC
HCT: 40.4 % (ref 39.0–52.0)
HEMOGLOBIN: 12.9 g/dL — AB (ref 13.0–17.0)
MCH: 29.3 pg (ref 26.0–34.0)
MCHC: 31.9 g/dL (ref 30.0–36.0)
MCV: 91.8 fL (ref 78.0–100.0)
PLATELETS: 205 10*3/uL (ref 150–400)
RBC: 4.4 MIL/uL (ref 4.22–5.81)
RDW: 13 % (ref 11.5–15.5)
WBC: 5.5 10*3/uL (ref 4.0–10.5)

## 2018-01-31 LAB — I-STAT TROPONIN, ED: Troponin i, poc: 0.01 ng/mL (ref 0.00–0.08)

## 2018-01-31 LAB — PROTIME-INR
INR: 0.96
PROTHROMBIN TIME: 12.7 s (ref 11.4–15.2)

## 2018-01-31 LAB — MAGNESIUM: Magnesium: 1.9 mg/dL (ref 1.7–2.4)

## 2018-01-31 LAB — CBG MONITORING, ED: Glucose-Capillary: 81 mg/dL (ref 65–99)

## 2018-01-31 LAB — BRAIN NATRIURETIC PEPTIDE: B NATRIURETIC PEPTIDE 5: 8.2 pg/mL (ref 0.0–100.0)

## 2018-01-31 LAB — APTT: aPTT: 31 seconds (ref 24–36)

## 2018-01-31 LAB — TROPONIN I: Troponin I: <0.03 ng/mL (ref <0.03)

## 2018-01-31 MED ORDER — FLUTICASONE PROPIONATE 50 MCG/ACT NA SUSP
2.0000 | Freq: Every day | NASAL | Status: DC
  Administered 2018-02-02: 2 via NASAL
  Filled 2018-01-31: qty 16

## 2018-01-31 MED ORDER — ACETAMINOPHEN 160 MG/5ML PO SOLN
650.0000 mg | ORAL | Status: DC | PRN
  Filled 2018-01-31: qty 20.3

## 2018-01-31 MED ORDER — NITROGLYCERIN 0.4 MG SL SUBL: 0.4000 mg | SUBLINGUAL_TABLET | SUBLINGUAL | Status: DC | PRN

## 2018-01-31 MED ORDER — PANTOPRAZOLE SODIUM 40 MG PO TBEC
40.0000 mg | DELAYED_RELEASE_TABLET | Freq: Every day | ORAL | Status: DC
  Administered 2018-02-01 – 2018-02-02 (×2): 40 mg via ORAL
  Filled 2018-01-31 (×2): qty 1

## 2018-01-31 MED ORDER — ATORVASTATIN CALCIUM 20 MG PO TABS
20.0000 mg | ORAL_TABLET | Freq: Every day | ORAL | Status: DC
  Administered 2018-02-01 – 2018-02-02 (×2): 20 mg via ORAL
  Filled 2018-01-31 (×2): qty 1

## 2018-01-31 MED ORDER — OXYBUTYNIN CHLORIDE 5 MG PO TABS
5.0000 mg | ORAL_TABLET | Freq: Every day | ORAL | Status: DC
  Administered 2018-02-01 – 2018-02-02 (×2): 5 mg via ORAL
  Filled 2018-01-31 (×2): qty 1

## 2018-01-31 MED ORDER — TAMSULOSIN HCL 0.4 MG PO CAPS
0.4000 mg | ORAL_CAPSULE | Freq: Every day | ORAL | Status: DC
  Administered 2018-02-01 (×2): 0.4 mg via ORAL
  Filled 2018-01-31 (×2): qty 1

## 2018-01-31 MED ORDER — METOPROLOL SUCCINATE ER 25 MG PO TB24
12.5000 mg | ORAL_TABLET | Freq: Every day | ORAL | Status: DC
  Administered 2018-02-01 – 2018-02-02 (×2): 12.5 mg via ORAL
  Filled 2018-01-31 (×2): qty 1

## 2018-01-31 MED ORDER — BUPROPION HCL ER (XL) 150 MG PO TB24
150.0000 mg | ORAL_TABLET | Freq: Every day | ORAL | Status: DC
  Administered 2018-02-01 – 2018-02-02 (×2): 150 mg via ORAL
  Filled 2018-01-31 (×2): qty 1

## 2018-01-31 MED ORDER — TIZANIDINE HCL 4 MG PO TABS
4.0000 mg | ORAL_TABLET | Freq: Two times a day (BID) | ORAL | Status: DC
  Administered 2018-02-01 (×2): 4 mg via ORAL
  Filled 2018-01-31 (×2): qty 1

## 2018-01-31 MED ORDER — MONTELUKAST SODIUM 10 MG PO TABS
10.0000 mg | ORAL_TABLET | Freq: Every day | ORAL | Status: DC
  Administered 2018-02-01 (×2): 10 mg via ORAL
  Filled 2018-01-31 (×2): qty 1

## 2018-01-31 MED ORDER — ASPIRIN EC 81 MG PO TBEC
81.0000 mg | DELAYED_RELEASE_TABLET | Freq: Every day | ORAL | Status: DC
  Administered 2018-02-01 – 2018-02-02 (×2): 81 mg via ORAL
  Filled 2018-01-31 (×2): qty 1

## 2018-01-31 MED ORDER — GABAPENTIN 400 MG PO CAPS
1600.0000 mg | ORAL_CAPSULE | Freq: Two times a day (BID) | ORAL | Status: DC
Start: 2018-02-01 — End: 2018-02-02
  Administered 2018-02-01 (×3): 1600 mg via ORAL
  Administered 2018-02-02: 400 mg via ORAL
  Filled 2018-01-31 (×4): qty 4

## 2018-01-31 MED ORDER — FAMOTIDINE 20 MG PO TABS
20.0000 mg | ORAL_TABLET | Freq: Two times a day (BID) | ORAL | Status: DC
  Administered 2018-02-01 – 2018-02-02 (×4): 20 mg via ORAL
  Filled 2018-01-31 (×4): qty 1

## 2018-01-31 MED ORDER — ADULT MULTIVITAMIN W/MINERALS CH
1.0000 | ORAL_TABLET | Freq: Every day | ORAL | Status: DC
  Administered 2018-02-01 – 2018-02-02 (×2): 1 via ORAL
  Filled 2018-01-31 (×2): qty 1

## 2018-01-31 MED ORDER — MAGNESIUM OXIDE 400 (241.3 MG) MG PO TABS
200.0000 mg | ORAL_TABLET | Freq: Every day | ORAL | Status: DC
  Administered 2018-02-01 – 2018-02-02 (×2): 200 mg via ORAL
  Filled 2018-01-31 (×2): qty 1

## 2018-01-31 MED ORDER — ENOXAPARIN SODIUM 40 MG/0.4ML ~~LOC~~ SOLN
40.0000 mg | SUBCUTANEOUS | Status: DC
  Administered 2018-02-01 – 2018-02-02 (×2): 40 mg via SUBCUTANEOUS
  Filled 2018-01-31 (×3): qty 0.4

## 2018-01-31 MED ORDER — INSULIN ASPART 100 UNIT/ML ~~LOC~~ SOLN
0.0000 [IU] | Freq: Three times a day (TID) | SUBCUTANEOUS | Status: DC
  Administered 2018-02-01: 2 [IU] via SUBCUTANEOUS
  Administered 2018-02-01 – 2018-02-02 (×2): 1 [IU] via SUBCUTANEOUS
  Administered 2018-02-02: 2 [IU] via SUBCUTANEOUS

## 2018-01-31 MED ORDER — ACETAMINOPHEN 650 MG RE SUPP: 650.0000 mg | RECTAL | Status: DC | PRN

## 2018-01-31 MED ORDER — ACETAMINOPHEN 325 MG PO TABS
650.0000 mg | ORAL_TABLET | ORAL | Status: DC | PRN
  Administered 2018-02-01 – 2018-02-02 (×5): 650 mg via ORAL
  Filled 2018-01-31 (×5): qty 2

## 2018-01-31 NOTE — ED Notes (Signed)
Dr. Raliegh Ip paged for pt orders

## 2018-01-31 NOTE — ED Notes (Signed)
Patient transported to X-ray 

## 2018-01-31 NOTE — ED Notes (Addendum)
Report attempted x1. Nurse upstairs not wanting to accept pt, they will talk to charge and call me back in 10 minutes.

## 2018-01-31 NOTE — ED Notes (Signed)
Report attempted x1. No answer.

## 2018-01-31 NOTE — H&P (Signed)
History and Physical    Michael Guzman FGB:[id number] DOB: 1946/03/19 DOA: 01/31/2018  PCP: [name], MD  Patient coming from: Home.  Chief Complaint: Loss of consciousness.  HPI: ACETON KINNEAR is a 72 y.o. male with history of CAD status post stenting, diabetes mellitus type 2 poorly controlled, hypertension, anemia, prostate cancer being treated with Lupron, hyperlipidemia presents to the ER after patient has been having recurrent episodes of possible loss of consciousness.  Patient states last week around Thursday Friday and Saturday patient had episodes of an while sitting on the breakfast table patient forgets things and finds food in his mouth and not sure if he lost consciousness.  Denies any incontinence of urine.  Denies any chest pain palpitations nausea vomiting diarrhea headache or any weakness of the extremities.  Each time it happened in the morning while having breakfast.  Patient had called up his primary care physician today and was instructed to come to the ER.  ED Course: In the ER CT head was unremarkable EKG showed normal sinus rhythm with QTC of 483 ms.  Patient appears nonfocal.  Patient admitted for further work-up of possible syncope/seizures.  Patient states his blood sugar was more than 100 during these episodes.  Patient has had poorly controlled blood pressure and has been recently being adjusted with his insulin medications by endocrinologist.  Review of Systems: As per HPI, rest all negative.   Past Medical History:  Diagnosis Date  . Arthritis   . CAD (coronary artery disease)   . Chronic back pain   . Chronically dry eyes   . Diabetes mellitus without complication ([place])   . Elevated PSA    being monitored  . GERD (gastroesophageal reflux disease)   . Hypertension   . Prostate cancer [facility])     Past Surgical History:  Procedure Laterality Date  . APPENDECTOMY    . CARDIAC SURGERY     3 stent placed.  . COLONOSCOPY  02/12/2010   Buccini  . LEFT HEART CATHETERIZATION WITH CORONARY ANGIOGRAM N/A 06/27/2012   Procedure: LEFT HEART CATHETERIZATION WITH CORONARY ANGIOGRAM;  Surgeon: Candee Furbish, MD;  Location: [facility] CATH LAB;  Service: Cardiovascular;  Laterality: N/A;  . LEFT HEART CATHETERIZATION WITH CORONARY ANGIOGRAM N/A 07/25/2012   Procedure: LEFT HEART CATHETERIZATION WITH CORONARY ANGIOGRAM;  Surgeon: Sueanne Margarita, MD;  Location: [place] CATH LAB;  Service: Cardiovascular;  Laterality: N/A;  . PROSTATE BIOPSY    . RADIOLOGY WITH ANESTHESIA N/A 12/03/2015   Procedure: MRI LUMBAR SPINE;  Surgeon: Medication Radiologist, MD;  Location: [place];  Service: Radiology;  Laterality: N/A;     reports that he quit smoking about 6 years ago. He quit after 50.00 years of use. He has never used smokeless tobacco. He reports that he drinks alcohol. He reports that he does not use drugs.  No Known Allergies  Family History  Problem Relation Age of Onset  . Diabetes Sister     Prior to Admission medications   Medication Sig Start Date End Date Taking? Authorizing Provider  aspirin EC 81 MG tablet Take 81 mg by mouth daily.   Yes [provider]  atorvastatin (LIPITOR) 20 MG tablet TAKE 1 TABLET BY MOUTH  DAILY 12/02/17  Yes Jerline Pain, MD  buPROPion (WELLBUTRIN XL) 150 MG 24 hr tablet Take 1 tablet (150 mg total) by mouth every morning. Patient taking differently: Take 150 mg by mouth daily.  11/19/17 11/19/18 Yes Vicie Mutters, PA-C  Cholecalciferol (VITAMIN D3)  5000 UNITS CAPS Take 5,000 Units by mouth 2 (two) times daily.   Yes [provider]  Dulaglutide (TRULICITY) 1.5 NM/0.7WK SOPN Inject 1.5 mg into the skin once a week. 01/12/18  Yes Philemon Kingdom, MD  fluticasone (FLONASE) 50 MCG/ACT nasal spray Place 2 sprays into both nostrils daily. 04/23/16  Yes Forcucci, Courtney, PA-C  furosemide (LASIX) 40 MG tablet Take 1 tablet 2 x/ day as needed  for fluid retention Patient taking differently: Take 40 mg by  mouth 2 (two) times daily.  01/16/18  Yes [name], MD  gabapentin (NEURONTIN) 800 MG tablet Take 1 tablet 3 to 4 x / day for pain, hot flashes & sweats Patient taking differently: Take 1,600 mg by mouth 2 (two) times daily.  09/10/17  Yes [name], MD  Magnesium 250 MG TABS Take 250 mg by mouth daily.   Yes [provider]  metFORMIN (GLUCOPHAGE-XR) 500 MG 24 hr tablet TAKE 1 TO 2 TABLETS BY  MOUTH TWO TIMES DAILY AS  DIRECTED FOR DIABETES Patient taking differently: Take 1,000 mg by mouth 2 (two) times daily.  09/10/17  Yes Vicie Mutters, PA-C  metoprolol succinate (TOPROL-XL) 25 MG 24 hr tablet TAKE ONE-HALF TABLET BY  MOUTH DAILY. TAKE WITH OR  IMMEDIATELY FOLLOWING A  MEAL. Patient taking differently: Take 12.5 mg by mouth daily. TAKE ONE-HALF TABLET BY  MOUTH DAILY. TAKE WITH OR  IMMEDIATELY FOLLOWING A  MEAL. 11/23/17  Yes Jerline Pain, MD  montelukast (SINGULAIR) 10 MG tablet TAKE 1 TABLET BY MOUTH AT  BEDTIME 09/10/17  Yes [name], MD  Multiple Vitamin (MULTIVITAMIN WITH MINERALS) TABS tablet Take 1 tablet by mouth daily.   Yes [provider]  nitroGLYCERIN (NITROSTAT) 0.4 MG SL tablet PLACE ONE TABLET UNDER THE TONGUE EVERY 5 MINUTES AS NEEDED FOR CHEST PAIN. 12/18/16  Yes [name], MD  oxybutynin (DITROPAN) 5 MG tablet Take 5 mg by mouth daily. 01/25/18  Yes [provider]  pantoprazole (PROTONIX) 40 MG tablet TAKE 1 TABLET BY MOUTH  DAILY 12/01/17  Yes [name], MD  ranitidine (ZANTAC) 150 MG tablet Take 1 tablet (150 mg total) by mouth 2 (two) times daily. Patient taking differently: Take 150 mg by mouth every other day.  11/04/17 11/04/18 Yes Liane Comber, NP  tamsulosin (FLOMAX) 0.4 MG CAPS capsule Take 0.4 mg by mouth at bedtime.   Yes [provider]  tiZANidine (ZANAFLEX) 4 MG tablet Take 4 mg by mouth 2 (two) times daily. 01/14/18  Yes [provider]  Blood Glucose Monitoring Suppl (ONE TOUCH  ULTRA SYSTEM KIT) w/Device KIT Check blood sugar 1 time daily-DX-E11.22 04/09/16   [name], MD  glucose blood (ONE TOUCH ULTRA TEST) test strip CHECK BLOOD SUGAR 2 TIMES  DAILY 09/10/17   Vicie Mutters, PA-C  promethazine-dextromethorphan (PROMETHAZINE-DM) 6.25-15 MG/5ML syrup Take 1 to 2 tsp enery 4 hours if needed for cough Patient not taking: Reported on 01/16/2018 09/10/17   [name], MD    Physical Exam: Vitals:   01/31/18 2130 01/31/18 2145 01/31/18 2200 01/31/18 2215  BP: (!) 138/92 (!) 144/97 (!) 140/97 121/78  Pulse: 75 75 74 73  Resp: 18 18 19 19   Temp:      TempSrc:      SpO2: 99% 98% 100% 99%  Weight:      Height:          Constitutional: Moderately built and nourished. Vitals:   01/31/18 2130 01/31/18 2145 01/31/18 2200 01/31/18 2215  BP: (!) 138/92 (!) 144/97 (!) 140/97 121/78  Pulse: 75 75 74 73  Resp: 18 18 19 19   Temp:      TempSrc:      SpO2: 99% 98% 100% 99%  Weight:      Height:       Eyes: Anicteric no pallor. ENMT: No discharge from the ears eyes nose or mouth. Neck: No mass felt.  No neck rigidity. Respiratory: No rhonchi or crepitations. Cardiovascular: S1-S2 heard no murmurs appreciated. Abdomen: Soft nontender bowel sounds present. Musculoskeletal: No edema.  No joint effusion. Skin: No rash. Neurologic: Alert awake oriented to time place and person.  Moves all extremities 5 x 5.  No facial asymmetry tongue is midline. Psychiatric: Appears normal.  Normal affect.   Labs on Admission: I have personally reviewed following labs and imaging studies  CBC: Recent Labs  Lab 01/31/18 1253 01/31/18 1335  WBC 5.5  --   NEUTROABS 3.6  --   HGB 12.9* 13.6  HCT 40.4 40.0  MCV 91.8  --   PLT 205  --    Basic Metabolic Panel: Recent Labs  Lab 01/31/18 1253 01/31/18 1335 01/31/18 1940  NA 136 137  --   K 4.0 4.1  --   CL 101 102  --   CO2 22  --   --   GLUCOSE 154* 156*  --   BUN 15 18  --   CREATININE 1.15 1.00  --     CALCIUM 9.4  --   --   MG  --   --  1.9   GFR: Estimated Creatinine Clearance: 80 mL/min (by C-G formula based on SCr of 1 mg/dL). Liver Function Tests: Recent Labs  Lab 01/31/18 1253  AST 31  ALT 40  ALKPHOS 73  BILITOT 0.6  PROT 6.8  ALBUMIN 4.1   No results for input(s): LIPASE, AMYLASE in the last 168 hours. No results for input(s): AMMONIA in the last 168 hours. Coagulation Profile: Recent Labs  Lab 01/31/18 1253  INR 0.96   Cardiac Enzymes: Recent Labs  Lab 01/31/18 1940  TROPONINI <0.03   BNP (last 3 results) No results for input(s): PROBNP in the last 8760 hours. HbA1C: No results for input(s): HGBA1C in the last 72 hours. CBG: Recent Labs  Lab 01/31/18 1931  GLUCAP 81   Lipid Profile: No results for input(s): CHOL, HDL, LDLCALC, TRIG, CHOLHDL, LDLDIRECT in the last 72 hours. Thyroid Function Tests: No results for input(s): TSH, T4TOTAL, FREET4, T3FREE, THYROIDAB in the last 72 hours. Anemia Panel: No results for input(s): VITAMINB12, FOLATE, FERRITIN, TIBC, IRON, RETICCTPCT in the last 72 hours. Urine analysis:    Component Value Date/Time   COLORURINE YELLOW 01/15/2018 2100   APPEARANCEUR CLEAR 01/15/2018 2100   LABSPEC 1.013 01/15/2018 2100   LABSPEC 1.020 10/14/2016 1230   PHURINE 5.0 01/15/2018 2100   GLUCOSEU 50 (A) 01/15/2018 2100   GLUCOSEU 2,000 10/14/2016 1230   HGBUR NEGATIVE 01/15/2018 2100   BILIRUBINUR NEGATIVE 01/15/2018 2100   BILIRUBINUR Negative 10/14/2016 1230   KETONESUR 5 (A) 01/15/2018 2100   PROTEINUR NEGATIVE 01/15/2018 2100   UROBILINOGEN 0.2 10/14/2016 1230   NITRITE NEGATIVE 01/15/2018 2100   LEUKOCYTESUR NEGATIVE 01/15/2018 2100   LEUKOCYTESUR Negative 10/14/2016 1230   Sepsis Labs: @LABRCNTIP (procalcitonin:4,lacticidven:4) )No results found for this or any previous visit (from the past 240 hour(s)).   Radiological Exams on Admission: Dg Chest 2 View  Result Date: 01/31/2018 CLINICAL DATA:  Weakness EXAM:  CHEST -  2 VIEW COMPARISON:  01/16/2018 chest radiograph. FINDINGS: Stable cardiomediastinal silhouette with normal heart size. No pneumothorax. No pleural effusion. No acute consolidative airspace disease. No pulmonary edema. Stable minimal scarring at the left lung base. Tiny 4 mm nodular opacity overlies the peripheral upper right lung, not seen on prior radiograph. IMPRESSION: Tiny 4 mm nodular opacity overlies the peripheral upper right lung, not seen on prior radiograph, cannot exclude a new pulmonary nodule. Otherwise no active disease in the chest. Chest CT recommended for further evaluation, which may be performed on a short term outpatient basis as clinically warranted. Electronically Signed   By: Ilona Sorrel M.D.   On: 01/31/2018 19:46   Ct Head Wo Contrast  Result Date: 01/31/2018 CLINICAL DATA:  Altered level of consciousness EXAM: CT HEAD WITHOUT CONTRAST TECHNIQUE: Contiguous axial images were obtained from the base of the skull through the vertex without intravenous contrast. COMPARISON:  01/30/2017 FINDINGS: Brain: No evidence of acute infarction, hemorrhage, hydrocephalus, extra-axial collection or mass lesion/mass effect. Partially empty sella with the elongated morphology extending into the clivus, stable and incidental. Atrophy with sulcal widening greatest along the sylvian fissures. Vascular: No hyperdense vessel or unexpected calcification. Skull: Normal. Negative for fracture or focal lesion. Sinuses/Orbits: No acute finding. IMPRESSION: No acute finding or change from 1 year prior. Electronically Signed   By: Monte Fantasia M.D.   On: 01/31/2018 13:23    EKG: Independently reviewed.  Normal sinus rhythm QTc 483 ms.  Assessment/Plan Principal Problem:   Syncope Active Problems:   Hypertension (1991)   ASCAD s/p PTCA (06/2012)   Type 2 diabetes mellitus with vascular disease ([place])    1. Possible syncope -we will also have to rule out seizures.  No signs of any hypoglycemic  episodes.  Closely follow CBGs monitor in telemetry for arrhythmia check 2D echo check MRI brain and EEG. 2. Diabetes mellitus type 2 patient is on NPH insulin I have decreased the dose by 10 units.  Follow sliding scale coverage.  Has poorly controlled diabetes with hemoglobin A1c 12.52 months ago. 3. Hypertension on metoprolol which dose has been decreased recently by cardiologist to 12.5. 4. Hyperlipidemia on statins. 5. CAD status post stenting denies any chest pain.  Continue with metoprolol statins and aspirin. 6. Normocytic normochromic anemia appears to be chronic and stable.  Follow CBC. 7. History of prostate cancer on Lupron shots.   DVT prophylaxis: Lovenox. Code Status: Full code. Family Communication: Discussed with patient's wife. Disposition Plan: Home. Consults called: None. Admission status: Observation.   Rise Patience MD [facility] Pager [phone].  If 7PM-7AM, please contact night-coverage [email or web address] Password [facility]  01/31/2018, 10:54 PM

## 2018-01-31 NOTE — ED Triage Notes (Signed)
PT states Thursday, Friday, and Saturday he experienced episodes where he "kept going out". PT states these episodes lasted about an hour and it took about 2 hours for him to feel like he had recovered from it. PT states during the episode its like he passes out, but is awake and cant do anything. Denies seizure hx. Pt states he has experienced CP and SOB at times with this. He endorses generalized weakness and states he feels too weak to even walk to vehicle. Pt states he was unable to sleep at all last night d/t severe headache.

## 2018-01-31 NOTE — ED Provider Notes (Signed)
[facility] EMERGENCY DEPARTMENT Provider Note   CSN: [id number] Arrival date & time: 01/31/18  1232     History   Chief Complaint Chief Complaint  Patient presents with  . Weakness  . Near Syncope    HPI Michael Guzman is a 72 y.o. male.   HPI   72 year old male with past medical history as below here with recurrent episodes of syncope.  Over the last several days, the patient has reportedly had multiple episodes in which he has gotten acutely weak and lost consciousness.  Symptoms first started last week, when he was getting out of his car and noticed that he felt acutely very weak and was unable to stand on his own.  He did not fall and his wife caught him.  Since then, he has had multiple episodes, both at rest and with exertion, in which she begins to feel very weak then passes out.  He has found himself sitting at the dinner table with food still in his mouth, with no prodrome, as well.  The symptoms seem to be increasing in severity and frequency.  He does endorse some occasional dull, aching, substernal and left-sided chest pressure over the same time.  Denies any palpitations, but does note that his heart feels like it skips some beats when he has these episodes.  Denies any recent medication changes.  Denies any other acute illnesses.  No chest pain currently.  No shortness of breath.  No abdominal pain, nausea, or vomiting.  Past Medical History:  Diagnosis Date  . Arthritis   . CAD (coronary artery disease)   . Chronic back pain   . Chronically dry eyes   . Diabetes mellitus without complication ([place])   . Elevated PSA    being monitored  . GERD (gastroesophageal reflux disease)   . Hypertension   . Prostate cancer [facility])     Patient Active Problem List   Diagnosis Date Noted  . Syncope 01/31/2018  . Type 2 diabetes mellitus with vascular disease ([place]) 01/31/2018  . Personal history of prostate cancer 11/04/2017  . Obesity (BMI 30.0-34.9)  11/03/2017  . DKA (diabetic ketoacidoses) ([place]) 01/30/2017  . Thyroiditis 05/12/2016  . Malignant neoplasm of prostate ([place]) 05/05/2016  . Diabetic peripheral neuropathy ([place]) 03/06/2015  . Medication management 07/24/2014  . Vitamin D deficiency 03/06/2014  . Mixed hyperlipidemia 06/28/2012  . ASCAD s/p PTCA (06/2012) 06/27/2012  . Hypertension (1991) 06/25/2012  . Poorly controlled type 2 diabetes mellitus with peripheral neuropathy ([place]) 06/25/2012  . GERD 06/25/2012    Past Surgical History:  Procedure Laterality Date  . APPENDECTOMY    . CARDIAC SURGERY     3 stent placed.  . COLONOSCOPY  02/12/2010   Buccini  . LEFT HEART CATHETERIZATION WITH CORONARY ANGIOGRAM N/A 06/27/2012   Procedure: LEFT HEART CATHETERIZATION WITH CORONARY ANGIOGRAM;  Surgeon: Candee Furbish, MD;  Location[facility] C[facility] LAB;  Service: Cardiovascular;  Laterality: N/A;  . LEFT HEART CATHETERIZATION WITH CORONARY ANGIOGRAM N/A 07/25/2012   Procedure: LEFT HEART CATHETERIZATION WITH CORONARY ANGIOGRAM;  Surgeon: Sueanne Margarita, MD;  Location: [facility] CATH LAB;  Service: Cardiovascular;  Laterality: N/A;  . PROSTATE BIOPSY    . RADIOLOGY WITH ANESTHESIA N/A 12/03/2015   Procedure: MRI LUMBAR SPINE;  Surgeon: Medication Radiologist, MD;  Location: [facility];  Service: Radiology;  Laterality: N/A;        Home Medications    Prior to Admission medications   Medication Sig Start Date End Date Taking?  Authorizing Provider  aspirin EC 81 MG tablet Take 81 mg by mouth daily.   Yes [provider]  atorvastatin (LIPITOR) 20 MG tablet TAKE 1 TABLET BY MOUTH  DAILY 12/02/17  Yes Jerline Pain, MD  buPROPion (WELLBUTRIN XL) 150 MG 24 hr tablet Take 1 tablet (150 mg total) by mouth every morning. Patient taking differently: Take 150 mg by mouth daily.  11/19/17 11/19/18 Yes Vicie Mutters, PA-C  Cholecalciferol (VITAMIN D3) 5000 UNITS CAPS Take 5,000 Units by mouth 2 (two) times daily.   Yes [provider]    Dulaglutide (TRULICITY) 1.5 GG/8.3MO SOPN Inject 1.5 mg into the skin once a week. 01/12/18  Yes Philemon Kingdom, MD  fluticasone (FLONASE) 50 MCG/ACT nasal spray Place 2 sprays into both nostrils daily. 04/23/16  Yes Forcucci, Courtney, PA-C  furosemide (LASIX) 40 MG tablet Take 1 tablet 2 x/ day as needed  for fluid retention Patient taking differently: Take 40 mg by mouth 2 (two) times daily.  01/16/18  Yes [name], MD  gabapentin (NEURONTIN) 800 MG tablet Take 1 tablet 3 to 4 x / day for pain, hot flashes & sweats Patient taking differently: Take 1,600 mg by mouth 2 (two) times daily.  09/10/17  Yes [name], MD  Magnesium 250 MG TABS Take 250 mg by mouth daily.   Yes [provider]  metFORMIN (GLUCOPHAGE-XR) 500 MG 24 hr tablet TAKE 1 TO 2 TABLETS BY  MOUTH TWO TIMES DAILY AS  DIRECTED FOR DIABETES Patient taking differently: Take 1,000 mg by mouth 2 (two) times daily.  09/10/17  Yes Vicie Mutters, PA-C  metoprolol succinate (TOPROL-XL) 25 MG 24 hr tablet TAKE ONE-HALF TABLET BY  MOUTH DAILY. TAKE WITH OR  IMMEDIATELY FOLLOWING A  MEAL. Patient taking differently: Take 12.5 mg by mouth daily. TAKE ONE-HALF TABLET BY  MOUTH DAILY. TAKE WITH OR  IMMEDIATELY FOLLOWING A  MEAL. 11/23/17  Yes Jerline Pain, MD  montelukast (SINGULAIR) 10 MG tablet TAKE 1 TABLET BY MOUTH AT  BEDTIME 09/10/17  Yes [name], MD  Multiple Vitamin (MULTIVITAMIN WITH MINERALS) TABS tablet Take 1 tablet by mouth daily.   Yes [provider]  nitroGLYCERIN (NITROSTAT) 0.4 MG SL tablet PLACE ONE TABLET UNDER THE TONGUE EVERY 5 MINUTES AS NEEDED FOR CHEST PAIN. 12/18/16  Yes [name], MD  oxybutynin (DITROPAN) 5 MG tablet Take 5 mg by mouth daily. 01/25/18  Yes [provider]  pantoprazole (PROTONIX) 40 MG tablet TAKE 1 TABLET BY MOUTH  DAILY 12/01/17  Yes [name], MD  ranitidine (ZANTAC) 150 MG tablet Take 1 tablet (150 mg total) by mouth 2 (two) times  daily. Patient taking differently: Take 150 mg by mouth every other day.  11/04/17 11/04/18 Yes Liane Comber, NP  tamsulosin (FLOMAX) 0.4 MG CAPS capsule Take 0.4 mg by mouth at bedtime.   Yes [provider]  tiZANidine (ZANAFLEX) 4 MG tablet Take 4 mg by mouth 2 (two) times daily. 01/14/18  Yes [provider]  Blood Glucose Monitoring Suppl (ONE TOUCH ULTRA SYSTEM KIT) w/Device KIT Check blood sugar 1 time daily-DX-E11.22 04/09/16   [name], MD  glucose blood (ONE TOUCH ULTRA TEST) test strip CHECK BLOOD SUGAR 2 TIMES  DAILY 09/10/17   Vicie Mutters, PA-C  promethazine-dextromethorphan (PROMETHAZINE-DM) 6.25-15 MG/5ML syrup Take 1 to 2 tsp enery 4 hours if needed for cough Patient not taking: Reported on 01/16/2018 09/10/17   [name], MD    Family History Family History  Problem Relation Age of Onset  . Diabetes Sister     Social History Social History   Tobacco Use  . Smoking status: Former Smoker    Years: 50.00    Last attempt to quit: 07/27/2011    Years since quitting: 6.5  . Smokeless tobacco: Never Used  Substance Use Topics  . Alcohol use: Yes    Comment: occasional  . Drug use: No     Allergies   Patient has no known allergies.   Review of Systems Review of Systems  Constitutional: Positive for fatigue. Negative for chills and fever.  HENT: Negative for congestion and rhinorrhea.   Eyes: Negative for visual disturbance.  Respiratory: Negative for cough, shortness of breath and wheezing.   Cardiovascular: Negative for chest pain and leg swelling.  Gastrointestinal: Negative for abdominal pain, diarrhea, nausea and vomiting.  Genitourinary: Negative for dysuria and flank pain.  Musculoskeletal: Negative for neck pain and neck stiffness.  Skin: Negative for rash and wound.  Allergic/Immunologic: Negative for immunocompromised state.  Neurological: Positive for syncope and weakness. Negative for headaches.  All other systems  reviewed and are negative.    Physical Exam Updated Vital Signs BP (!) 150/99 (BP Location: Left Arm)   Pulse 75   Temp 98.3 F (36.8 C) (Oral)   Resp 16   Ht _0  (1.778 m)   Wt 96.9 kg (213 lb 9.6 oz)   SpO2 100%   BMI 30.65 kg/m   Physical Exam  Constitutional: He is oriented to person, place, and time. He appears well-developed and well-nourished. No distress.  HENT:  Head: Normocephalic and atraumatic.  Eyes: Conjunctivae are normal.  Neck: Neck supple.  Cardiovascular: Normal rate, regular rhythm and normal heart sounds. Exam reveals no friction rub.  No murmur heard. Pulmonary/Chest: Effort normal and breath sounds normal. No respiratory distress. He has no wheezes. He has no rales.  Abdominal: He exhibits no distension.  Musculoskeletal: He exhibits edema.  Neurological: He is alert and oriented to person, place, and time. He exhibits normal muscle tone.  Skin: Skin is warm. Capillary refill takes less than 2 seconds.  Psychiatric: He has a normal mood and affect.  Nursing note and vitals reviewed.    ED Treatments / Results  Labs (all labs ordered are listed, but only abnormal results are displayed) Labs Reviewed  CBC - Abnormal; Notable for the following components:      Result Value   Hemoglobin 12.9 (*)    All other components within normal limits  COMPREHENSIVE METABOLIC PANEL - Abnormal; Notable for the following components:   Glucose, Bld 154 (*)    All other components within normal limits  I-STAT CHEM 8, ED - Abnormal; Notable for the following components:   Glucose, Bld 156 (*)    Calcium, Ion 1.13 (*)    All other components within normal limits  PROTIME-INR  APTT  DIFFERENTIAL  TROPONIN I  BRAIN NATRIURETIC PEPTIDE  MAGNESIUM  COMPREHENSIVE METABOLIC PANEL  CBC  TROPONIN I  I-STAT TROPONIN, ED  CBG MONITORING, ED    EKG EKG Interpretation  Date/Time:  Monday Jan 31 2018 12:38:47 EDT Ventricular Rate:  79 PR Interval:  164 QRS  Duration: 100 QT Interval:  422 QTC Calculation: 483 R Axis:   -23 Text Interpretation:  Normal sinus rhythm Moderate voltage criteria for LVH, may be normal variant Prolonged QT Abnormal ECG Since last EKG, signs of LVH are more evident Confirmed by Duffy Bruce [phone]) on 01/31/2018 6:25:54 PM  Radiology Dg Chest 2 View  Result Date: 01/31/2018 CLINICAL DATA:  Weakness EXAM: CHEST - 2 VIEW COMPARISON:  01/16/2018 chest radiograph. FINDINGS: Stable cardiomediastinal silhouette with normal heart size. No pneumothorax. No pleural effusion. No acute consolidative airspace disease. No pulmonary edema. Stable minimal scarring at the left lung base. Tiny 4 mm nodular opacity overlies the peripheral upper right lung, not seen on prior radiograph. IMPRESSION: Tiny 4 mm nodular opacity overlies the peripheral upper right lung, not seen on prior radiograph, cannot exclude a new pulmonary nodule. Otherwise no active disease in the chest. Chest CT recommended for further evaluation, which may be performed on a short term outpatient basis as clinically warranted. Electronically Signed   By: Ilona Sorrel M.D.   On: 01/31/2018 19:46   Ct Head Wo Contrast  Result Date: 01/31/2018 CLINICAL DATA:  Altered level of consciousness EXAM: CT HEAD WITHOUT CONTRAST TECHNIQUE: Contiguous axial images were obtained from the base of the skull through the vertex without intravenous contrast. COMPARISON:  01/30/2017 FINDINGS: Brain: No evidence of acute infarction, hemorrhage, hydrocephalus, extra-axial collection or mass lesion/mass effect. Partially empty sella with the elongated morphology extending into the clivus, stable and incidental. Atrophy with sulcal widening greatest along the sylvian fissures. Vascular: No hyperdense vessel or unexpected calcification. Skull: Normal. Negative for fracture or focal lesion. Sinuses/Orbits: No acute finding. IMPRESSION: No acute finding or change from 1 year prior. Electronically  Signed   By: Monte Fantasia M.D.   On: 01/31/2018 13:23    Procedures Procedures (including critical care time)  Medications Ordered in ED Medications  aspirin EC tablet 81 mg (has no administration in time range)  atorvastatin (LIPITOR) tablet 20 mg (has no administration in time range)  buPROPion (WELLBUTRIN XL) 24 hr tablet 150 mg (has no administration in time range)  fluticasone (FLONASE) 50 MCG/ACT nasal spray 2 spray (has no administration in time range)  gabapentin (NEURONTIN) capsule 1,600 mg (1,600 mg Oral Given 02/01/18 0054)  magnesium oxide (MAG-OX) tablet 200 mg (has no administration in time range)  metoprolol succinate (TOPROL-XL) 24 hr tablet 12.5 mg (has no administration in time range)  montelukast (SINGULAIR) tablet 10 mg (10 mg Oral Given 02/01/18 0054)  multivitamin with minerals tablet 1 tablet (has no administration in time range)  nitroGLYCERIN (NITROSTAT) SL tablet 0.4 mg (has no administration in time range)  oxybutynin (DITROPAN) tablet 5 mg (has no administration in time range)  pantoprazole (PROTONIX) EC tablet 40 mg (has no administration in time range)  famotidine (PEPCID) tablet 20 mg (20 mg Oral Given 02/01/18 0054)  tamsulosin (FLOMAX) capsule 0.4 mg (0.4 mg Oral Given 02/01/18 0052)  tiZANidine (ZANAFLEX) tablet 4 mg (4 mg Oral Given 02/01/18 0053)  acetaminophen (TYLENOL) tablet 650 mg (650 mg Oral Given 02/01/18 0053)    Or  acetaminophen (TYLENOL) solution 650 mg ( Per Tube See Alternative 02/01/18 0053)    Or  acetaminophen (TYLENOL) suppository 650 mg ( Rectal See Alternative 02/01/18 0053)  enoxaparin (LOVENOX) injection 40 mg (has no administration in time range)  insulin aspart (novoLOG) injection 0-9 Units (has no administration in time range)     Initial Impression / Assessment and Plan / ED Course  I have reviewed the triage vital signs and the nursing notes.  Pertinent labs & imaging results that were available during my care of the patient  were reviewed by me and considered in my medical decision making (see chart for details).     72 yo M here with recurrent  near- and full syncopal episodes, w/ occasional palpitations. Pt has h/o recently worsening peripheral edema as well. Episodes seem to be increasing in frequency. Initial labs unremarkable, EKG is non-ischemic though with mildly prolonged QTc. High risk syncope history and comorbidities. Will admit.  Final Clinical Impressions(s) / ED Diagnoses   Final diagnoses:  Syncope, unspecified syncope type    ED Discharge Orders    None       Duffy Bruce, MD 02/01/18 0200

## 2018-01-31 NOTE — ED Notes (Signed)
Pt placement contacted over pts room assignment

## 2018-02-01 ENCOUNTER — Observation Stay (HOSPITAL_COMMUNITY): Payer: Medicare Other

## 2018-02-01 ENCOUNTER — Other Ambulatory Visit (HOSPITAL_COMMUNITY): Payer: Medicare Other

## 2018-02-01 ENCOUNTER — Other Ambulatory Visit: Payer: Self-pay

## 2018-02-01 DIAGNOSIS — R404 Transient alteration of awareness: Secondary | ICD-10-CM

## 2018-02-01 DIAGNOSIS — I10 Essential (primary) hypertension: Secondary | ICD-10-CM

## 2018-02-01 DIAGNOSIS — E669 Obesity, unspecified: Secondary | ICD-10-CM | POA: Diagnosis not present

## 2018-02-01 DIAGNOSIS — E1159 Type 2 diabetes mellitus with other circulatory complications: Secondary | ICD-10-CM | POA: Diagnosis not present

## 2018-02-01 DIAGNOSIS — R55 Syncope and collapse: Secondary | ICD-10-CM | POA: Diagnosis not present

## 2018-02-01 LAB — COMPREHENSIVE METABOLIC PANEL
ALK PHOS: 61 U/L (ref 38–126)
ALT: 37 U/L (ref 17–63)
AST: 33 U/L (ref 15–41)
Albumin: 3.6 g/dL (ref 3.5–5.0)
Anion gap: 12 (ref 5–15)
BUN: 11 mg/dL (ref 6–20)
CALCIUM: 9.4 mg/dL (ref 8.9–10.3)
CO2: 24 mmol/L (ref 22–32)
CREATININE: 1.05 mg/dL (ref 0.61–1.24)
Chloride: 103 mmol/L (ref 101–111)
GFR calc Af Amer: >60 mL/min (ref >60)
Glucose, Bld: 179 mg/dL — ABNORMAL HIGH (ref 65–99)
Potassium: 4.2 mmol/L (ref 3.5–5.1)
Sodium: 139 mmol/L (ref 135–145)
Total Bilirubin: 0.5 mg/dL (ref 0.3–1.2)
Total Protein: 6.2 g/dL — ABNORMAL LOW (ref 6.5–8.1)

## 2018-02-01 LAB — GLUCOSE, CAPILLARY
GLUCOSE-CAPILLARY: 159 mg/dL — AB (ref 65–99)
GLUCOSE-CAPILLARY: 146 mg/dL — AB (ref 65–99)
Glucose-Capillary: 96 mg/dL (ref 65–99)
Glucose-Capillary: 145 mg/dL — ABNORMAL HIGH (ref 65–99)

## 2018-02-01 LAB — CBC
HCT: 37.2 % — ABNORMAL LOW (ref 39.0–52.0)
Hemoglobin: 12.2 g/dL — ABNORMAL LOW (ref 13.0–17.0)
MCH: 29.7 pg (ref 26.0–34.0)
MCHC: 32.8 g/dL (ref 30.0–36.0)
MCV: 90.5 fL (ref 78.0–100.0)
PLATELETS: 184 10*3/uL (ref 150–400)
RBC: 4.11 MIL/uL — AB (ref 4.22–5.81)
RDW: 13 % (ref 11.5–15.5)
WBC: 5.1 10*3/uL (ref 4.0–10.5)

## 2018-02-01 LAB — TROPONIN I

## 2018-02-01 MED ORDER — TIZANIDINE HCL 4 MG PO TABS
4.0000 mg | ORAL_TABLET | Freq: Two times a day (BID) | ORAL | Status: DC | PRN
  Administered 2018-02-01: 4 mg via ORAL
  Filled 2018-02-01 (×2): qty 1

## 2018-02-01 MED ORDER — INSULIN NPH (HUMAN) (ISOPHANE) 100 UNIT/ML ~~LOC~~ SUSP
30.0000 [IU] | Freq: Two times a day (BID) | SUBCUTANEOUS | Status: DC
  Administered 2018-02-01 – 2018-02-02 (×2): 30 [IU] via SUBCUTANEOUS
  Filled 2018-02-01 (×3): qty 10

## 2018-02-01 NOTE — Care Management Obs Status (Signed)
[facility] NOTIFICATION   Patient Details  Name: Michael Guzman MRN: [id number] Date of Birth: 04-Mar-1946   Medicare Observation Status Notification Given:  Yes    Carles Collet, RN 02/01/2018, 9:36 AM

## 2018-02-01 NOTE — Plan of Care (Signed)
  Problem: Pain Managment: Goal: General experience of comfort will improve Outcome: Progressing   Problem: Safety: Goal: Ability to remain free from injury will improve Outcome: Progressing   

## 2018-02-01 NOTE — Progress Notes (Signed)
Progress Note    Michael Guzman  KWI:[id number] DOB: [birth year]  DOA: 01/31/2018 PCP: [name], MD    Brief Narrative:    Medical records reviewed and are as summarized below:  Michael Guzman is an 72 y.o. male with history of CAD status post stenting, diabetes mellitus type 2 poorly controlled, hypertension, anemia, prostate cancer being treated with Lupron, hyperlipidemia presents to the ER after patient has been having recurrent episodes of possible loss of consciousness.  Patient states last week around Thursday Friday and Saturday patient had episodes of an while sitting on the breakfast table patient forgets things and finds food in his mouth and not sure if he lost consciousness.     Assessment/Plan:   Principal Problem:   Syncope Active Problems:   Hypertension (1991)   ASCAD s/p PTCA (06/2012)   Type 2 diabetes mellitus with vascular disease ([place])  AMS/memory loss/possible syncope -No signs of any hypoglycemic episodes-- says blood sugars over 100 with each episode - monitor in telemetry for arrhythmia  -2D echo  -MRI brain  -EEG done and was normal  -? OSA-- needs outpatient sleep study  Diabetes mellitus type 2  -decreased the dose of insulin by 10 units. -SSI -Has poorly controlled diabetes with hemoglobin A1c 12 -on high dose of Neurontin BID  Hypertension  - metoprolol- recent decrease of medications  Hyperlipidemia -statin.  CAD status post stenting denies any chest pain.   - metoprolol, statins, and aspirin.  Normocytic normochromic anemia appears to be chronic and stable  74mm nodular opacity on x ray -outpatient CT scan  History of prostate cancer on Lupron shots.  obesity Body mass index is 30.53 kg/m.   Family Communication/Anticipated D/C date and plan/Code Status   DVT prophylaxis: Lovenox ordered. Code Status: Full Code.  Family Communication: none at bedside Disposition Plan:    Medical Consultants:     None.    Subjective:   Walked with PT and is very fatigued  Objective:    Vitals:   02/01/18 0419 02/01/18 0446 02/01/18 0951 02/01/18 1137  BP: 109/88 118/75 (!) 141/90 101/77  Pulse: 70 72 74 70  Resp: 16 18    Temp: 98.2 F (36.8 C) 98 F (36.7 C)  98.2 F (36.8 C)  TempSrc: Oral Oral  Oral  SpO2: 93% 100% 99% 97%  Weight:  96.5 kg (212 lb 11.9 oz)    Height:        Intake/Output Summary (Last 24 hours) at 02/01/2018 1427 Last data filed at 02/01/2018 1030 Gross per 24 hour  Intake 340 ml  Output 400 ml  Net -60 ml   Filed Weights   01/31/18 1925 01/31/18 2347 02/01/18 0446  Weight: 96.2 kg (212 lb) 96.9 kg (213 lb 9.6 oz) 96.5 kg (212 lb 11.9 oz)    Exam: Sleeping in chair-- startled awake rrr Diminished but in increased work of breathing +BS, obese +LE edema  Data Reviewed:   I have personally reviewed following labs and imaging studies:  Labs: Labs show the following:   Basic Metabolic Panel: Recent Labs  Lab 01/31/18 1253 01/31/18 1335 01/31/18 1940 02/01/18 0221  NA 136 137  --  139  K 4.0 4.1  --  4.2  CL 101 102  --  103  CO2 22  --   --  24  GLUCOSE 154* 156*  --  179*  BUN 15 18  --  11  CREATININE 1.15 1.00  --  1.05  CALCIUM 9.4  --   --  9.4  MG  --   --  1.9  --    GFR Estimated Creatinine Clearance: 76.3 mL/min (by C-G formula based on SCr of 1.05 mg/dL). Liver Function Tests: Recent Labs  Lab 01/31/18 1253 02/01/18 0221  AST 31 33  ALT 40 37  ALKPHOS 73 61  BILITOT 0.6 0.5  PROT 6.8 6.2*  ALBUMIN 4.1 3.6   No results for input(s): LIPASE, AMYLASE in the last 168 hours. No results for input(s): AMMONIA in the last 168 hours. Coagulation profile Recent Labs  Lab 01/31/18 1253  INR 0.96    CBC: Recent Labs  Lab 01/31/18 1253 01/31/18 1335 02/01/18 0221  WBC 5.5  --  5.1  NEUTROABS 3.6  --   --   HGB 12.9* 13.6 12.2*  HCT 40.4 40.0 37.2*  MCV 91.8  --  90.5  PLT 205  --  184   Cardiac  Enzymes: Recent Labs  Lab 01/31/18 1940 02/01/18 0221  TROPONINI <0.03 <0.03   BNP (last 3 results) No results for input(s): PROBNP in the last 8760 hours. CBG: Recent Labs  Lab 01/31/18 1931 02/01/18 1136  GLUCAP 81 159*   D-Dimer: No results for input(s): DDIMER in the last 72 hours. Hgb A1c: No results for input(s): HGBA1C in the last 72 hours. Lipid Profile: No results for input(s): CHOL, HDL, LDLCALC, TRIG, CHOLHDL, LDLDIRECT in the last 72 hours. Thyroid function studies: No results for input(s): TSH, T4TOTAL, T3FREE, THYROIDAB in the last 72 hours.  Invalid input(s): FREET3 Anemia work up: No results for input(s): VITAMINB12, FOLATE, FERRITIN, TIBC, IRON, RETICCTPCT in the last 72 hours. Sepsis Labs: Recent Labs  Lab 01/31/18 1253 02/01/18 0221  WBC 5.5 5.1    Microbiology No results found for this or any previous visit (from the past 240 hour(s)).  Procedures and diagnostic studies:  Dg Chest 2 View  Result Date: 01/31/2018 CLINICAL DATA:  Weakness EXAM: CHEST - 2 VIEW COMPARISON:  01/16/2018 chest radiograph. FINDINGS: Stable cardiomediastinal silhouette with normal heart size. No pneumothorax. No pleural effusion. No acute consolidative airspace disease. No pulmonary edema. Stable minimal scarring at the left lung base. Tiny 4 mm nodular opacity overlies the peripheral upper right lung, not seen on prior radiograph. IMPRESSION: Tiny 4 mm nodular opacity overlies the peripheral upper right lung, not seen on prior radiograph, cannot exclude a new pulmonary nodule. Otherwise no active disease in the chest. Chest CT recommended for further evaluation, which may be performed on a short term outpatient basis as clinically warranted. Electronically Signed   By: Ilona Sorrel M.D.   On: 01/31/2018 19:46   Ct Head Wo Contrast  Result Date: 01/31/2018 CLINICAL DATA:  Altered level of consciousness EXAM: CT HEAD WITHOUT CONTRAST TECHNIQUE: Contiguous axial images were  obtained from the base of the skull through the vertex without intravenous contrast. COMPARISON:  01/30/2017 FINDINGS: Brain: No evidence of acute infarction, hemorrhage, hydrocephalus, extra-axial collection or mass lesion/mass effect. Partially empty sella with the elongated morphology extending into the clivus, stable and incidental. Atrophy with sulcal widening greatest along the sylvian fissures. Vascular: No hyperdense vessel or unexpected calcification. Skull: Normal. Negative for fracture or focal lesion. Sinuses/Orbits: No acute finding. IMPRESSION: No acute finding or change from 1 year prior. Electronically Signed   By: Monte Fantasia M.D.   On: 01/31/2018 13:23    Medications:   . aspirin EC  81 mg Oral Daily  . atorvastatin  20 mg  Oral Daily  . buPROPion  150 mg Oral Daily  . enoxaparin (LOVENOX) injection  40 mg Subcutaneous Q24H  . famotidine  20 mg Oral BID  . fluticasone  2 spray Each Nare Daily  . gabapentin  1,600 mg Oral BID  . insulin aspart  0-9 Units Subcutaneous TID WC  . insulin NPH Human  30 Units Subcutaneous BID AC & HS  . magnesium oxide  200 mg Oral Daily  . metoprolol succinate  12.5 mg Oral Daily  . montelukast  10 mg Oral QHS  . multivitamin with minerals  1 tablet Oral Daily  . oxybutynin  5 mg Oral Daily  . pantoprazole  40 mg Oral Daily  . tamsulosin  0.4 mg Oral QHS  . tiZANidine  4 mg Oral BID   Continuous Infusions:   LOS: 0 days   Geradine Girt  [facility]   *Please refer to [email or web address], password [email or web address] to get updated schedule on who will round on this patient, as hospitalists switch teams weekly. If 7PM-7AM, please contact night-coverage at [email or web address], password [email or web address] for any overnight needs.  02/01/2018, 2:27 PM

## 2018-02-01 NOTE — Evaluation (Signed)
Physical Therapy Evaluation Patient Details Name: Michael Guzman MRN: [id number] DOB: June 02, 1946 Today's Date: 02/01/2018   History of Present Illness  DARRIAN GRZELAK is a 72 y.o. male with history of CAD status post stenting, diabetes mellitus type 2 poorly controlled, hypertension, anemia, prostate cancer being treated with Lupron, hyperlipidemia presents to the ER after patient has been having recurrent episodes of possible loss of consciousness  Clinical Impression  Pt admitted with above. Pt with limited activity tolerance. Discussed energy conservation and using walker when in community to allow for increased ambulation tolerance. Pt reports "that walking really wore me out, I'm exhausted". Acute PT to con't to follow.    Follow Up Recommendations Home health PT;Supervision for mobility/OOB    Equipment Recommendations  Rolling walker with 5" wheels    Recommendations for Other Services       Precautions / Restrictions Precautions Precautions: Fall Precaution Comments: +SOB Restrictions Weight Bearing Restrictions: No      Mobility  Bed Mobility Overal bed mobility: Modified Independent             General bed mobility comments: HOB elevated, didn't use bed rail  Transfers Overall transfer level: Needs assistance Equipment used: None Transfers: Sit to/from Stand Sit to Stand: Min guard         General transfer comment: pt with wide base of support, mildly unsteady.   Ambulation/Gait Ambulation/Gait assistance: Min guard;Min assist Ambulation Distance (Feet): 150 Feet Assistive device: None Gait Pattern/deviations: Step-through pattern;Decreased stride length;Wide base of support Gait velocity: progressively slower Gait velocity interpretation: 1.31 - 2.62 ft/sec, indicative of limited community ambulator General Gait Details: pt with onset of R knee instability requiring minA for stability. pt reports "walking wears me out and I get weak"    Stairs Stairs: Yes Stairs assistance: Min guard Stair Management: One rail Right;Alternating pattern;Step to pattern;Forwards Number of Stairs: 4 General stair comments: alternating up, step to down  Wheelchair Mobility    Modified Rankin (Stroke Patients Only)       Balance Overall balance assessment: Needs assistance Sitting-balance support: Feet supported;No upper extremity supported Sitting balance-Leahy Scale: Good     Standing balance support: No upper extremity supported;During functional activity Standing balance-Leahy Scale: Fair                               Pertinent Vitals/Pain Pain Assessment: No/denies pain    Home Living Family/patient expects to be discharged to:: Private residence Living Arrangements: Spouse/significant other Available Help at Discharge: Family;Available 24 hours/day Type of Home: House Home Access: Stairs to enter Entrance Stairs-Rails: Psychiatric nurse of Steps: 4 Home Layout: One level Home Equipment: Cane - single point;Walker - 4 wheels;Bedside commode      Prior Function Level of Independence: Independent               Hand Dominance   Dominant Hand: Right    Extremity/Trunk Assessment   Upper Extremity Assessment Upper Extremity Assessment: Generalized weakness    Lower Extremity Assessment Lower Extremity Assessment: Generalized weakness    Cervical / Trunk Assessment Cervical / Trunk Assessment: Normal  Communication   Communication: No difficulties  Cognition Arousal/Alertness: Awake/alert Behavior During Therapy: WFL for tasks assessed/performed Overall Cognitive Status: Within Functional Limits for tasks assessed  General Comments General comments (skin integrity, edema, etc.): discussed energy conservation and using walker during ambulation to improve balance and to be able to increase ambulation tolerance     Exercises     Assessment/Plan    PT Assessment Patient needs continued PT services  PT Problem List Decreased strength;Decreased range of motion;Decreased activity tolerance;Decreased balance;Decreased mobility;Decreased knowledge of use of DME;Decreased safety awareness       PT Treatment Interventions DME instruction;Gait training;Stair training;Functional mobility training;Therapeutic activities;Therapeutic exercise;Balance training;Neuromuscular re-education    PT Goals (Current goals can be found in the Care Plan section)  Acute Rehab PT Goals Patient Stated Goal: improve mobility PT Goal Formulation: With patient Time For Goal Achievement: 02/15/18 Potential to Achieve Goals: Good    Frequency Min 3X/week   Barriers to discharge        Co-evaluation               AM-PAC PT "6 Clicks" Daily Activity  Outcome Measure Difficulty turning over in bed (including adjusting bedclothes, sheets and blankets)?: None Difficulty moving from lying on back to sitting on the side of the bed? : None Difficulty sitting down on and standing up from a chair with arms (e.g., wheelchair, bedside commode, etc,.)?: A Little Help needed moving to and from a bed to chair (including a wheelchair)?: A Little Help needed walking in hospital room?: A Little Help needed climbing 3-5 steps with a railing? : A Little 6 Click Score: 20    End of Session Equipment Utilized During Treatment: Gait belt Activity Tolerance: Patient limited by fatigue Patient left: in chair;with call bell/phone within reach Nurse Communication: Mobility status PT Visit Diagnosis: Unsteadiness on feet (R26.81)    Time: 6213-0865 PT Time Calculation (min) (ACUTE ONLY): 18 min   Charges:   PT Evaluation $PT Eval Moderate Complexity: 1 Mod     PT G CodesKittie Plater, PT, DPT Pager #: [phone] Office #: [phone]   [place] 02/01/2018, 2:42 PM

## 2018-02-01 NOTE — Progress Notes (Signed)
PT Cancellation Note  Patient Details Name: Michael Guzman MRN: [id number] DOB: 09/24/1945   Cancelled Treatment:    Reason Eval/Treat Not Completed: Patient at procedure or test/unavailable. Pt off floor at EEG. PT to return as able to complete PT eval.  Kittie Plater, PT, DPT Pager #: [phone] Office #: [phone]    [facility] 02/01/2018, 9:19 AM

## 2018-02-01 NOTE — Procedures (Signed)
ELECTROENCEPHALOGRAM REPORT  Date of Study: 02/01/2018  Patient's Name: Michael Guzman MRN: [id number] Date of Birth: [birth year]  Referring Provider: Gean Birchwood, MD  Clinical History: 72 year old male with type 2 diabetes, hypertension, ASCAD status post PTCA who presents for recurrent episodes of altered awareness.  Medications: Wellbutrin Gabapentin Tizanidine Oxybutynin Insulin Metoprolol ASA Lipitor   Technical Summary: A multichannel digital EEG recording measured by the international 10-20 system with electrodes applied with paste and impedances below 5000 ohms performed in our laboratory with EKG monitoring in an awake and drowsy patient.  Hyperventilation was not performed.  Photic stimulation was performed.  The digital EEG was referentially recorded, reformatted, and digitally filtered in a variety of bipolar and referential montages for optimal display.    Description: The patient is awake and drowsy during the recording.  During maximal wakefulness, there is a symmetric, medium voltage 8 Hz posterior dominant rhythm that attenuates with eye opening.  The record is symmetric.  Stage 2 sleep is not seen.  Photic stimulation did not elicit any abnormalities.  There were no epileptiform discharges or electrographic seizures seen.    EKG lead was unremarkable.  Impression: This awake and drowsy EEG is normal.    Clinical Correlation: A normal EEG does not exclude a clinical diagnosis of epilepsy.  If further clinical questions remain, prolonged EEG may be helpful.  Clinical correlation is advised.   Metta Clines, DO

## 2018-02-01 NOTE — Progress Notes (Signed)
Routine adult EEG completed, results pending. 

## 2018-02-02 ENCOUNTER — Observation Stay (HOSPITAL_BASED_OUTPATIENT_CLINIC_OR_DEPARTMENT_OTHER): Payer: Medicare Other

## 2018-02-02 DIAGNOSIS — E1159 Type 2 diabetes mellitus with other circulatory complications: Secondary | ICD-10-CM | POA: Diagnosis not present

## 2018-02-02 DIAGNOSIS — I361 Nonrheumatic tricuspid (valve) insufficiency: Secondary | ICD-10-CM

## 2018-02-02 DIAGNOSIS — E669 Obesity, unspecified: Secondary | ICD-10-CM | POA: Diagnosis not present

## 2018-02-02 DIAGNOSIS — R55 Syncope and collapse: Secondary | ICD-10-CM | POA: Diagnosis not present

## 2018-02-02 DIAGNOSIS — I10 Essential (primary) hypertension: Secondary | ICD-10-CM | POA: Diagnosis not present

## 2018-02-02 LAB — ECHOCARDIOGRAM COMPLETE
HEIGHTINCHES: 70 in
WEIGHTICAEL: 3401.6 [oz_av]

## 2018-02-02 LAB — GLUCOSE, CAPILLARY
GLUCOSE-CAPILLARY: 172 mg/dL — AB (ref 65–99)
Glucose-Capillary: 136 mg/dL — ABNORMAL HIGH (ref 65–99)
Glucose-Capillary: 104 mg/dL — ABNORMAL HIGH (ref 65–99)

## 2018-02-02 MED ORDER — GABAPENTIN 400 MG PO CAPS: 1600.0000 mg | ORAL_CAPSULE | Freq: Every day | ORAL | Status: DC

## 2018-02-02 MED ORDER — GABAPENTIN 400 MG PO CAPS: 800.0000 mg | ORAL_CAPSULE | Freq: Every day | ORAL | Status: DC

## 2018-02-02 MED ORDER — GABAPENTIN 800 MG PO TABS: 1600.0000 mg | ORAL_TABLET | Freq: Two times a day (BID) | ORAL | Status: DC

## 2018-02-02 MED ORDER — TIZANIDINE HCL 4 MG PO TABS: 4.0000 mg | ORAL_TABLET | Freq: Two times a day (BID) | ORAL | 0 refills | Status: DC | PRN

## 2018-02-02 NOTE — Discharge Summary (Addendum)
Physician Discharge Summary  Michael Guzman CVE:[id number] DOB: 07/08/1946 DOA: 01/31/2018  PCP: [name], MD  Admit date: 01/31/2018 Discharge date: 02/02/2018  Admitted From: home Discharge disposition: home   Recommendations for Outpatient Follow-Up:   1. Patient did not want to wait for echo results, please follow up on reading by cardiology 2. Referral for sleep apnea study 3. Change Zanaflex to PRN instead of scheduled 4. outpatient CT chest for pulm nodule per radiology   Discharge Diagnosis:   Principal Problem:   Syncope Active Problems:   Hypertension (1991)   ASCAD s/p PTCA (06/2012)   Type 2 diabetes mellitus with vascular disease ([place])    Discharge Condition: Improved.  Diet recommendation: Low sodium, heart healthy.  Carbohydrate-modified  Wound care: None.  Code status: Full.   History of Present Illness:   Michael Guzman is a 72 y.o. male with history of CAD status post stenting, diabetes mellitus type 2 poorly controlled, hypertension, anemia, prostate cancer being treated with Lupron, hyperlipidemia presents to the ER after patient has been having recurrent episodes of possible loss of consciousness.  Patient states last week around Thursday Friday and Saturday patient had episodes of an while sitting on the breakfast table patient forgets things and finds food in his mouth and not sure if he lost consciousness.  Denies any incontinence of urine.  Denies any chest pain palpitations nausea vomiting diarrhea headache or any weakness of the extremities.  Each time it happened in the morning while having breakfast.  Patient had called up his primary care physician today and was instructed to come to the ER.     Hospital Course by Problem:   AMS/memory loss/possible syncope- not a clear etiology-- broad differential -No signs of any hypoglycemic episodes-- says blood sugars over 100 with each episode per patient - no arrhythmia on  tele -2D echo read pending -MRI brain with no CVA-- some atrophy -EEG done and was normal  -? OSA-- needs outpatient sleep study-- patient refusing -? Polypharmacy-- on neurontin as well as zanaflex scheduled  Diabetes mellitus type 2  -resume home regimen  Hypertension  - metoprolol- recent decrease of medications  Hyperlipidemia -statin.  CAD status post stenting denies any chest pain.  - metoprolol, statins, and aspirin.  Normocytic normochromic anemia appears to be chronic and stable  85m nodular opacity on x ray -outpatient CT scan  History of prostate cancer on Lupron shots.  obesity Body mass index is 30.53 kg/m.      Medical Consultants:      Discharge Exam:   Vitals:   02/02/18 0623 02/02/18 1206  BP: 123/68 121/84  Pulse: 72 79  Resp: 18   Temp: 98.3 F (36.8 C) 98.6 F (37 C)  SpO2: 98% 97%   Vitals:   02/01/18 1137 02/01/18 1923 02/02/18 0623 02/02/18 1206  BP: 101/77 101/68 123/68 121/84  Pulse: 70 76 72 79  Resp:  16 18   Temp: 98.2 F (36.8 C) 97.9 F (36.6 C) 98.3 F (36.8 C) 98.6 F (37 C)  TempSrc: Oral Oral Oral Oral  SpO2: 97% 98% 98% 97%  Weight:   96.4 kg (212 lb 9.6 oz)   Height:        General exam: Appears anxious and irritable    The results of significant diagnostics from this hospitalization (including imaging, microbiology, ancillary and laboratory) are listed below for reference.     Procedures and Diagnostic Studies:   Dg Chest 2 View  Result Date: 01/31/2018 CLINICAL DATA:  Weakness EXAM: CHEST - 2 VIEW COMPARISON:  01/16/2018 chest radiograph. FINDINGS: Stable cardiomediastinal silhouette with normal heart size. No pneumothorax. No pleural effusion. No acute consolidative airspace disease. No pulmonary edema. Stable minimal scarring at the left lung base. Tiny 4 mm nodular opacity overlies the peripheral upper right lung, not seen on prior radiograph. IMPRESSION: Tiny 4 mm nodular opacity overlies  the peripheral upper right lung, not seen on prior radiograph, cannot exclude a new pulmonary nodule. Otherwise no active disease in the chest. Chest CT recommended for further evaluation, which may be performed on a short term outpatient basis as clinically warranted. Electronically Signed   By: Ilona Sorrel M.D.   On: 01/31/2018 19:46   Ct Head Wo Contrast  Result Date: 01/31/2018 CLINICAL DATA:  Altered level of consciousness EXAM: CT HEAD WITHOUT CONTRAST TECHNIQUE: Contiguous axial images were obtained from the base of the skull through the vertex without intravenous contrast. COMPARISON:  01/30/2017 FINDINGS: Brain: No evidence of acute infarction, hemorrhage, hydrocephalus, extra-axial collection or mass lesion/mass effect. Partially empty sella with the elongated morphology extending into the clivus, stable and incidental. Atrophy with sulcal widening greatest along the sylvian fissures. Vascular: No hyperdense vessel or unexpected calcification. Skull: Normal. Negative for fracture or focal lesion. Sinuses/Orbits: No acute finding. IMPRESSION: No acute finding or change from 1 year prior. Electronically Signed   By: Monte Fantasia M.D.   On: 01/31/2018 13:23   Mr Brain Wo Contrast  Result Date: 02/01/2018 CLINICAL DATA:  Altered mental status. Recurrent episodes of loss of consciousness. EXAM: MRI HEAD WITHOUT CONTRAST TECHNIQUE: Multiplanar, multiecho pulse sequences of the brain and surrounding structures were obtained without intravenous contrast. COMPARISON:  Head CT 01/31/2018 FINDINGS: BRAIN: Partially empty sella. There is no acute infarct or acute hemorrhage. There is no mass lesion or other mass effect. There is no hydrocephalus, dural abnormality or extra-axial collection. Multifocal white matter hyperintensity, most commonly due to chronic ischemic microangiopathy. Generalized atrophy without lobar predilection. No chronic microhemorrhage or superficial siderosis. VASCULAR: Major  intracranial arterial and venous sinus flow voids are preserved. SKULL AND UPPER CERVICAL SPINE: The visualized skull base, calvarium, upper cervical spine and extracranial soft tissues are normal. SINUSES/ORBITS: No fluid levels or advanced mucosal thickening. No mastoid or middle ear effusion. The orbits are normal. IMPRESSION: Generalized atrophy and chronic small vessel disease without acute intracranial abnormality. Electronically Signed   By: Ulyses Jarred M.D.   On: 02/01/2018 17:36     Labs:   Basic Metabolic Panel: Recent Labs  Lab 01/31/18 1253 01/31/18 1335 01/31/18 1940 02/01/18 0221  NA 136 137  --  139  K 4.0 4.1  --  4.2  CL 101 102  --  103  CO2 22  --   --  24  GLUCOSE 154* 156*  --  179*  BUN 15 18  --  11  CREATININE 1.15 1.00  --  1.05  CALCIUM 9.4  --   --  9.4  MG  --   --  1.9  --    GFR Estimated Creatinine Clearance: 76.3 mL/min (by C-G formula based on SCr of 1.05 mg/dL). Liver Function Tests: Recent Labs  Lab 01/31/18 1253 02/01/18 0221  AST 31 33  ALT 40 37  ALKPHOS 73 61  BILITOT 0.6 0.5  PROT 6.8 6.2*  ALBUMIN 4.1 3.6   No results for input(s): LIPASE, AMYLASE in the last 168 hours. No results for input(s): AMMONIA in the  last 168 hours. Coagulation profile Recent Labs  Lab 01/31/18 1253  INR 0.96    CBC: Recent Labs  Lab 01/31/18 1253 01/31/18 1335 02/01/18 0221  WBC 5.5  --  5.1  NEUTROABS 3.6  --   --   HGB 12.9* 13.6 12.2*  HCT 40.4 40.0 37.2*  MCV 91.8  --  90.5  PLT 205  --  184   Cardiac Enzymes: Recent Labs  Lab 01/31/18 1940 02/01/18 0221  TROPONINI <0.03 <0.03   BNP: Invalid input(s): POCBNP CBG: Recent Labs  Lab 02/01/18 1136 02/01/18 1733 02/01/18 2051 02/02/18 0754 02/02/18 1205  GLUCAP 159* 145* 146* 136* 172*   D-Dimer No results for input(s): DDIMER in the last 72 hours. Hgb A1c No results for input(s): HGBA1C in the last 72 hours. Lipid Profile No results for input(s): CHOL, HDL, LDLCALC,  TRIG, CHOLHDL, LDLDIRECT in the last 72 hours. Thyroid function studies No results for input(s): TSH, T4TOTAL, T3FREE, THYROIDAB in the last 72 hours.  Invalid input(s): FREET3 Anemia work up No results for input(s): VITAMINB12, FOLATE, FERRITIN, TIBC, IRON, RETICCTPCT in the last 72 hours. Microbiology No results found for this or any previous visit (from the past 240 hour(s)).   Discharge Instructions:   Discharge Instructions    Diet - low sodium heart healthy   Complete by:  As directed    Diet Carb Modified   Complete by:  As directed    Discharge instructions   Complete by:  As directed    Echo reading pending-- follow up with PCP for results Referral for sleep apnea study   Increase activity slowly   Complete by:  As directed      Allergies as of 02/02/2018   No Known Allergies     Medication List    STOP taking these medications   promethazine-dextromethorphan 6.25-15 MG/5ML syrup Commonly known as:  PROMETHAZINE-DM     TAKE these medications   aspirin EC 81 MG tablet Take 81 mg by mouth daily.   atorvastatin 20 MG tablet Commonly known as:  LIPITOR TAKE 1 TABLET BY MOUTH  DAILY   buPROPion 150 MG 24 hr tablet Commonly known as:  WELLBUTRIN XL Take 1 tablet (150 mg total) by mouth every morning. What changed:  when to take this   Dulaglutide 1.5 MG/0.5ML Sopn Commonly known as:  TRULICITY Inject 1.5 mg into the skin once a week.   fluticasone 50 MCG/ACT nasal spray Commonly known as:  FLONASE Place 2 sprays into both nostrils daily.   furosemide 40 MG tablet Commonly known as:  LASIX Take 1 tablet 2 x/ day as needed  for fluid retention What changed:    how much to take  how to take this  when to take this  additional instructions   gabapentin 800 MG tablet Commonly known as:  NEURONTIN Take 2 tablets (1,600 mg total) by mouth 2 (two) times daily.   glucose blood test strip Commonly known as:  ONE TOUCH ULTRA TEST CHECK BLOOD SUGAR 2  TIMES  DAILY   Magnesium 250 MG Tabs Take 250 mg by mouth daily.   metFORMIN 500 MG 24 hr tablet Commonly known as:  GLUCOPHAGE-XR TAKE 1 TO 2 TABLETS BY  MOUTH TWO TIMES DAILY AS  DIRECTED FOR DIABETES What changed:    how much to take  how to take this  when to take this  additional instructions   metoprolol succinate 25 MG 24 hr tablet Commonly known as:  TOPROL-XL TAKE  ONE-HALF TABLET BY  MOUTH DAILY. TAKE WITH OR  IMMEDIATELY FOLLOWING A  MEAL. What changed:    how much to take  how to take this  when to take this  additional instructions   montelukast 10 MG tablet Commonly known as:  SINGULAIR TAKE 1 TABLET BY MOUTH AT  BEDTIME   multivitamin with minerals Tabs tablet Take 1 tablet by mouth daily.   nitroGLYCERIN 0.4 MG SL tablet Commonly known as:  NITROSTAT PLACE ONE TABLET UNDER THE TONGUE EVERY 5 MINUTES AS NEEDED FOR CHEST PAIN.   ONE TOUCH ULTRA SYSTEM KIT w/Device Kit Check blood sugar 1 time daily-DX-E11.22   oxybutynin 5 MG tablet Commonly known as:  DITROPAN Take 5 mg by mouth daily.   pantoprazole 40 MG tablet Commonly known as:  PROTONIX TAKE 1 TABLET BY MOUTH  DAILY   ranitidine 150 MG tablet Commonly known as:  ZANTAC Take 1 tablet (150 mg total) by mouth 2 (two) times daily. What changed:  when to take this   tamsulosin 0.4 MG Caps capsule Commonly known as:  FLOMAX Take 0.4 mg by mouth at bedtime.   tiZANidine 4 MG tablet Commonly known as:  ZANAFLEX Take 1 tablet (4 mg total) by mouth 2 (two) times daily as needed for muscle spasms. What changed:    when to take this  reasons to take this   Vitamin D3 5000 units Caps Take 5,000 Units by mouth 2 (two) times daily.      Follow-up Information    [name], MD Follow up in 1 week(s).   Specialty:  Internal Medicine Why:  referral for sleep study Contact information: [id number] [place] [address] [phone]            Time  coordinating discharge: 35 min  Signed:  Geradine Girt  [facility] 02/02/2018, 3:16 PM

## 2018-02-02 NOTE — Progress Notes (Signed)
Physical Therapy Treatment Patient Details Name: Michael Guzman MRN: [id number] DOB: 1946/04/23 Today's Date: 02/02/2018    History of Present Illness Michael Guzman is a 72 y.o. male with history of CAD status post stenting, diabetes mellitus type 2 poorly controlled, hypertension, anemia, prostate cancer being treated with Lupron, hyperlipidemia presents to the ER after patient has been having recurrent episodes of possible loss of consciousness    PT Comments    Pt c/o chest tightness with walking yesterday without AD a distance of 150' requiring him to lay down for 1.5 hours prior to relief. Pt ambulated with 4WW this date and states it was easier and less strenuous but he did feel the chest tightness come on. RN notified. Discussed using the rollator for community ambulation. Acute PT to con't to follow.   Follow Up Recommendations  Home health PT;Supervision for mobility/OOB     Equipment Recommendations  Rolling walker with 5" wheels    Recommendations for Other Services       Precautions / Restrictions Precautions Precautions: Fall Precaution Comments: chest tightness with activity Restrictions Weight Bearing Restrictions: No    Mobility  Bed Mobility               General bed mobility comments: pt up in chair upon PT arrival  Transfers Overall transfer level: Needs assistance Equipment used: None Transfers: Sit to/from Stand Sit to Stand: Supervision         General transfer comment: no difficulty  Ambulation/Gait Ambulation/Gait assistance: Min guard Ambulation Distance (Feet): 150 Feet Assistive device: 4-wheeled walker Gait Pattern/deviations: Step-through pattern;Decreased stride length;Wide base of support Gait velocity: faster than yesterday Gait velocity interpretation: 1.31 - 2.62 ft/sec, indicative of limited community ambulator General Gait Details: pt with mild SOB, SpO2 >92% on RA. pt reports increased ease with ambulation today  compared to without RW.   Stairs             Wheelchair Mobility    Modified Rankin (Stroke Patients Only)       Balance Overall balance assessment: Mild deficits observed, not formally tested                                          Cognition Arousal/Alertness: Awake/alert Behavior During Therapy: WFL for tasks assessed/performed Overall Cognitive Status: Within Functional Limits for tasks assessed                                        Exercises      General Comments        Pertinent Vitals/Pain Pain Assessment: No/denies pain    Home Living                      Prior Function            PT Goals (current goals can now be found in the care plan section) Acute Rehab PT Goals Patient Stated Goal: improve mobility Progress towards PT goals: Progressing toward goals    Frequency    Min 3X/week      PT Plan Current plan remains appropriate    Co-evaluation              AM-PAC PT "6 Clicks" Daily Activity  Outcome Measure  Difficulty turning over in bed (  including adjusting bedclothes, sheets and blankets)?: None Difficulty moving from lying on back to sitting on the side of the bed? : None Difficulty sitting down on and standing up from a chair with arms (e.g., wheelchair, bedside commode, etc,.)?: None Help needed moving to and from a bed to chair (including a wheelchair)?: A Little Help needed walking in hospital room?: A Little Help needed climbing 3-5 steps with a railing? : A Little 6 Click Score: 21    End of Session Equipment Utilized During Treatment: Gait belt Activity Tolerance: Patient limited by fatigue Patient left: in chair;with call bell/phone within reach Nurse Communication: Mobility status PT Visit Diagnosis: Unsteadiness on feet (R26.81)     Time: 7741-2878 PT Time Calculation (min) (ACUTE ONLY): 14 min  Charges:  $Gait Training: 8-22 mins                    G Codes:        Kittie Plater, PT, DPT Pager #: [phone] Office #: [phone]    [place] 02/02/2018, 2:00 PM

## 2018-02-02 NOTE — Progress Notes (Signed)
patient was given d/c instructions, IV and tele off, wanted to walk to his car with no wheel chair, Denies dizziness and vital are stable.

## 2018-02-02 NOTE — Progress Notes (Signed)
  Echocardiogram 2D Echocardiogram has been performed.  Michael Guzman 02/02/2018, 2:31 PM

## 2018-02-04 DIAGNOSIS — M47816 Spondylosis without myelopathy or radiculopathy, lumbar region: Secondary | ICD-10-CM | POA: Diagnosis not present

## 2018-02-07 ENCOUNTER — Encounter: Payer: Self-pay | Admitting: Internal Medicine

## 2018-02-07 NOTE — Patient Instructions (Signed)

## 2018-02-07 NOTE — Progress Notes (Signed)
[facility]   [name], M.D.     Uvaldo Bristle. Silverio Lay, P.A.-C Liane Comber, [place]                [address] [place], [place] [address] Telephone [phone] Telefax [phone] Annual  Screening/Preventative Visit  & Comprehensive Evaluation & Examination & [place]      This very nice 72 y.o. MBM presents for a Screening/Preventative Visit & comprehensive evaluation and management of multiple medical co-morbidities.  Patient has been followed for HTN, HLD,  T2_IDDM  and Vitamin D Deficiency. Patient was Dx'd with Prostate Ca (04/2016), treated with ext XRT thru 04/2017 and he's also been on Lupron injections 06/05/18 and 12/09/16.Patient also has ongoing issues with Depression and a multitude of arthralgias/myalgias and increased caregiver fatigue with his wife on Dialysis. Patient is agreeable to increasing his Wellbutrin from 150 mg up to 300 mg. He has gained 17 # over the last year (182# in May 2018 to now current 214#) consequent of increasing his insulin.     Patient was recently hospitalized 05/20/29019 thru 02/02/2018 for Recurrent Syncope. Telemetry monitoring was unremarkable. Head CT scan & Brain MRI showed atrophy w/o other pathology. Cardiac troponins, D-Dimer  & BNP were all negative. Blood glucose monitoring showed no wide swings. EEG was negative.During hospitalization he demonstrated no more episodes of syncope and was released.  Interestingly , today patients describes 3 episodes w/o LOC and he just physically "shutdown" and felt "like a paralysis"  and each occurred about 9-10 am.     The patient now presents for 6 day follow up for transition from recent hospitalization.  The day after discharge  our clinical staff contacted the patient to assure stability and schedule a follow up appointment. The discharge summary, medications and diagnostic test results  were reviewed before meeting with the patient. Hospitalization discharge instructions and medications are reconciled with the patient.      HTN predates since 11. Patient's BP has been controlled at home.  Today's BP: 118/82. In 2013, patient underwent PCA & Stenting.  Patient denies any cardiac symptoms as chest pain, palpitations, shortness of breath, dizziness or ankle swelling.     Patient's hyperlipidemia is controlled with diet and medications. Patient denies myalgias or other medication SE's. Last lipids were at goal albeit elevated Trig's: Lab Results  Component Value Date   CHOL 104 11/04/2017   HDL 39 (L) 11/04/2017   LDLCALC 34 11/04/2017   LDLDIRECT 45.7 10/30/2013   TRIG 265 (H) 11/04/2017   CHOLHDL 2.7 11/04/2017      Patient has T2_NIDDM (1998) and dietary compliance has been poor with poorly controlled A1c's. In May 2016, he was transitioned to Insulin and more recently he's been seeing Dr C. Gherghe. Patient reports difficulty complying with a tid scale regimen.  Currently he takes INSULIN - NPH 40 units & Reg 35 units 2 x/day at [facility]. (He relate he usually forgets to take his noon time NPH 25 units).  Patient denies visual blurring, diabetic polys or paresthesias. Last A1c was not at goal: Lab Results  Component Value Date   HGBA1C 12.5 (H) 11/04/2017       Finally, patient has history of Vitamin D Deficiency ("15"/2008) and last vitamin D was at goal: Lab Results  Component Value Date   VD25OH 82 08/04/2017  Current Outpatient Medications on File Prior to Visit  Medication Sig  . aspirin EC 81 MG tablet Take 81 mg by mouth daily.  Marland Kitchen atorvastatin (LIPITOR) 20 MG tablet TAKE 1 TABLET BY MOUTH  DAILY  . Blood Glucose Monitoring Suppl (ONE TOUCH ULTRA SYSTEM KIT) w/Device KIT Check blood sugar 1 time daily-[id number]  . Cholecalciferol (VITAMIN D3) 5000 UNITS CAPS Take 5,000 Units by mouth 2 (two) times daily.  . Dulaglutide (TRULICITY) 1.5 XQ/1.1HE SOPN  Inject 1.5 mg into the skin once a week.  . fluticasone (FLONASE) 50 MCG/ACT nasal spray Place 2 sprays into both nostrils daily.  . furosemide (LASIX) 40 MG tablet Take 1 tablet 2 x/ day as needed  for fluid retention (Patient taking differently: Take 40 mg by mouth 2 (two) times daily. )  . gabapentin (NEURONTIN) 800 MG tablet Take 2 tablets (1,600 mg total) by mouth 2 (two) times daily.  Marland Kitchen glucose blood (ONE TOUCH ULTRA TEST) test strip CHECK BLOOD SUGAR 2 TIMES  DAILY  . Magnesium 250 MG TABS Take 250 mg by mouth daily.  . metFORMIN (GLUCOPHAGE-XR) 500 MG 24 hr tablet TAKE 1 TO 2 TABLETS BY  MOUTH TWO TIMES DAILY AS  DIRECTED FOR DIABETES (Patient taking differently: Take 1,000 mg by mouth 2 (two) times daily. )  . metoprolol succinate (TOPROL-XL) 25 MG 24 hr tablet TAKE ONE-HALF TABLET BY  MOUTH DAILY. TAKE WITH OR  IMMEDIATELY FOLLOWING A  MEAL. (Patient taking differently: Take 12.5 mg by mouth daily. TAKE ONE-HALF TABLET BY  MOUTH DAILY. TAKE WITH OR  IMMEDIATELY FOLLOWING A  MEAL.)  . montelukast (SINGULAIR) 10 MG tablet TAKE 1 TABLET BY MOUTH AT  BEDTIME  . Multiple Vitamin (MULTIVITAMIN WITH MINERALS) TABS tablet Take 1 tablet by mouth daily.  . nitroGLYCERIN (NITROSTAT) 0.4 MG SL tablet PLACE ONE TABLET UNDER THE TONGUE EVERY 5 MINUTES AS NEEDED FOR CHEST PAIN.  Marland Kitchen oxybutynin (DITROPAN) 5 MG tablet Take 5 mg by mouth daily.  . pantoprazole (PROTONIX) 40 MG tablet TAKE 1 TABLET BY MOUTH  DAILY  . ranitidine (ZANTAC) 150 MG tablet Take 1 tablet (150 mg total) by mouth 2 (two) times daily. (Patient taking differently: Take 150 mg by mouth every other day. )  . tamsulosin (FLOMAX) 0.4 MG CAPS capsule Take 0.4 mg by mouth at bedtime.  Marland Kitchen tiZANidine (ZANAFLEX) 4 MG tablet Take 1 tablet (4 mg total) by mouth 2 (two) times daily as needed for muscle spasms.   No current facility-administered medications on file prior to visit.    No Known Allergies Past Medical History:  Diagnosis Date  .  Arthritis   . CAD (coronary artery disease)   . Chronic back pain   . Chronically dry eyes   . Diabetes mellitus without complication ([place])   . Elevated PSA    being monitored  . GERD (gastroesophageal reflux disease)   . Hypertension   . Prostate cancer [facility])    Health Maintenance  Topic Date Due  . URINE MICROALBUMIN  01/15/2018  . OPHTHALMOLOGY EXAM  04/08/2018  . INFLUENZA VACCINE  04/14/2018  . HEMOGLOBIN A1C  05/04/2018  . FOOT EXAM  02/08/2019  . COLONOSCOPY  02/13/2020  . TETANUS/TDAP  11/27/2023  . Hepatitis C Screening  Completed  . PNA vac Low Risk Adult  Completed   Immunization History  Administered Date(s) Administered  . Influenza Split 06/26/2012  . Influenza, High Dose Seasonal PF 06/20/2015, 06/23/2016, 06/14/2017  . Influenza-Unspecified 06/14/2013, 06/25/2014  . Pneumococcal  Conjugate-13 04/23/2016  . Pneumococcal Polysaccharide-23 06/14/2017  . Pneumococcal-Unspecified 06/16/2010  . Td 09/17/2003  . Tdap 11/26/2013  . Zoster 07/12/2012   Last Colon -  06.01.2011 - Dr Romilda Garret - recc 10 yr f/u - due June 2021  Past Surgical History:  Procedure Laterality Date  . APPENDECTOMY    . CARDIAC SURGERY     3 stent placed.  . COLONOSCOPY  02/12/2010   [facility]  . LEFT HEART CATHETERIZATION WITH CORONARY ANGIOGRAM N/A 06/27/2012   Procedure: LEFT HEART CATHETERIZATION WITH CORONARY ANGIOGRAM;  Surgeon: Candee Furbish, MD;  Location: [facility] CATH LAB;  Service: Cardiovascular;  Laterality: N/A;  . LEFT HEART CATHETERIZATION WITH CORONARY ANGIOGRAM N/A 07/25/2012   Procedure: LEFT HEART CATHETERIZATION WITH CORONARY ANGIOGRAM;  Surgeon: Sueanne Margarita, MD;  Location: [facility] CATH LAB;  Service: Cardiovascular;  Laterality: N/A;  . PROSTATE BIOPSY    . RADIOLOGY WITH ANESTHESIA N/A 12/03/2015   Procedure: MRI LUMBAR SPINE;  Surgeon: Medication Radiologist, MD;  Location: [facility];  Service: Radiology;  Laterality: N/A;   Family History  Problem Relation Age of Onset  .  Diabetes Sister    Social History   Socioeconomic History  . Marital status: Married    Spouse name: Not on file  . Number of children: 4  . Years of education: 2 y colleg  Occupational History  . Occupation: SUPERVISOR - retired    Fish farm manager: [employer]  Tobacco Use  . Smoking status: Former Smoker    Years: 50.00    Last attempt to quit: 07/27/2011    Years since quitting: 6.5  . Smokeless tobacco: Never Used  Substance and Sexual Activity  . Alcohol use: Yes    Comment: occasional  . Drug use: No  . Sexual activity: Not on file    ROS Constitutional: Denies fever, chills, weight loss/gain, headaches, insomnia,  night sweats or change in appetite. Does c/o fatigue. Eyes: Denies redness, blurred vision, diplopia, discharge, itchy or watery eyes.  ENT: Denies discharge, congestion, post nasal drip, epistaxis, sore throat, earache, hearing loss, dental pain, Tinnitus, Vertigo, Sinus pain or snoring.  Cardio: Denies chest pain, palpitations, irregular heartbeat, syncope, dyspnea, diaphoresis, orthopnea, PND, claudication or edema Respiratory: denies cough, dyspnea, DOE, pleurisy, hoarseness, laryngitis or wheezing.  Gastrointestinal: Denies dysphagia, heartburn, reflux, water brash, pain, cramps, nausea, vomiting, bloating, diarrhea, constipation, hematemesis, melena, hematochezia, jaundice or hemorrhoids Genitourinary: Denies dysuria, frequency, urgency, nocturia, hesitancy, discharge, hematuria or flank pain Musculoskeletal: Denies arthralgia, myalgia, stiffness, Jt. Swelling, pain, limp or strain/sprain. Denies Falls. Skin: Denies puritis, rash, hives, warts, acne, eczema or change in skin lesion Neuro: No weakness, tremor, incoordination, spasms, paresthesia or pain Psychiatric: Denies confusion, memory loss or sensory loss. Denies Depression. Endocrine: Denies change in weight, skin, hair change, nocturia, and paresthesia, diabetic polys, visual blurring or hyper /  hypo glycemic episodes.  Heme/Lymph: No excessive bleeding, bruising or enlarged lymph nodes.  Physical Exam  BP 118/82   Pulse 80   Temp (!) 97.1 F (36.2 C)   Resp 18   Ht 5' 9"  (1.753 m)   Wt 214 lb 3.2 oz (97.2 kg)   BMI 31.63 kg/m   General Appearance: Over nourished and well groomed and in no apparent distress.  Eyes: PERRLA, EOMs, conjunctiva no swelling or erythema, normal fundi and vessels. Sinuses: No frontal/maxillary tenderness ENT/Mouth: EACs patent / TMs  nl. Nares clear without erythema, swelling, mucoid exudates. Oral hygiene is good. No erythema, swelling, or exudate. Tongue normal, non-obstructing. Tonsils  not swollen or erythematous. Hearing normal.  Neck: Supple, thyroid not palpable. No bruits, nodes or JVD. Respiratory: Respiratory effort normal.  BS equal and clear bilateral without rales, rhonci, wheezing or stridor. Cardio: Heart sounds are normal with regular rate and rhythm and no murmurs, rubs or gallops. Peripheral pulses are normal and equal bilaterally without edema. No aortic or femoral bruits. Chest: symmetric with normal excursions and percussion.  Abdomen: Soft, with Nl bowel sounds. Nontender, no guarding, rebound, hernias, masses, or organomegaly.  Lymphatics: Non tender without lymphadenopathy.  Genitourinary: Deferred to his Urologist Musculoskeletal: Full ROM all peripheral extremities, joint stability, 5/5 strength, and normal gait. Skin: Warm and dry without rashes, lesions, cyanosis, clubbing or  ecchymosis.  Neuro: Cranial nerves intact, reflexes equal bilaterally. Normal muscle tone, no cerebellar symptoms. Sensation intact to touch, vibratory and Monofilament to the toes bilaterally. Pysch: Alert and oriented X 3 with flat affect and limited insight with judgment appropriate.   Assessment and Plan  1. Syncope  - Suspect that this may have represented hypoglycemic reactions occurring after Breakfast and a moderate dose or regular  insulin (35 units). Recommended that he decrease the reg insulin down from 35 to 25 units with breakfast & supper.  2. Essential hypertension  - Urinalysis, Routine w reflex microscopic - Microalbumin / creatinine urine ratio - CBC with Differential/Platelet - COMPLETE METABOLIC PANEL WITH GFR - Magnesium - TSH  3. Mixed hyperlipidemia  - COMPLETE METABOLIC PANEL WITH GFR - Lipid panel - TSH  4. Type 2 diabetes mellitus with stage 2 chronic kidney disease, with long-term current use of insulin (HCC)  - COMPLETE METABOLIC PANEL WITH GFR - Hemoglobin A1c  5. Vitamin D deficiency  - VITAMIN D 25 Hydroxy  6. ASCAD s/p PTCA (06/2012)  - Lipid panel  7. Gastroesophageal reflux disease without esophagitis  - CBC with Differential/Platelet  8. Poorly controlled type 2 diabetes mellitus with peripheral neuropathy (HCC)  - [facility] DIABETES FOOT EXAM - LOW EXTREMITY NEUR EXAM DOCUM  9. Prostate cancer screening  - PSA  10. Personal history of prostate cancer  - PSA  11. Screening for AAA (aortic abdominal aneurysm)  - [place], RETROPERITNL ABD,  [place]  12. Former smoker  - [place], RETROPERITNL ABD,  [place]  13. FHx: heart disease  - [place], RETROPERITNL ABD,  L[place]  14. Medication management  - Urinalysis, Routine w reflex microscopic - Microalbumin / creatinine urine ratio - CBC with Differential/Platelet - COMPLETE METABOLIC PANEL WITH GFR - Magnesium - Lipid panel - TSH - Hemoglobin A1c - VITAMIN D 25 Hydroxyl           Patient was counseled in prudent diet, weight control to achieve/maintain BMI less than 25, BP monitoring, regular exercise and medications as discussed.  Discussed med effects and SE's. Routine screening labs and tests as requested with regular follow-up as recommended. Over 40 minutes of exam, counseling, chart review and high complex critical decision making was performed

## 2018-02-08 ENCOUNTER — Ambulatory Visit (INDEPENDENT_AMBULATORY_CARE_PROVIDER_SITE_OTHER): Payer: Medicare Other | Admitting: Internal Medicine

## 2018-02-08 ENCOUNTER — Encounter: Payer: Self-pay | Admitting: Internal Medicine

## 2018-02-08 VITALS — BP 118/82 | HR 80 | Temp 97.1°F | Resp 18 | Ht 69.0 in | Wt 214.2 lb

## 2018-02-08 DIAGNOSIS — E1165 Type 2 diabetes mellitus with hyperglycemia: Secondary | ICD-10-CM | POA: Diagnosis not present

## 2018-02-08 DIAGNOSIS — Z125 Encounter for screening for malignant neoplasm of prostate: Secondary | ICD-10-CM

## 2018-02-08 DIAGNOSIS — Z8249 Family history of ischemic heart disease and other diseases of the circulatory system: Secondary | ICD-10-CM

## 2018-02-08 DIAGNOSIS — E1142 Type 2 diabetes mellitus with diabetic polyneuropathy: Secondary | ICD-10-CM

## 2018-02-08 DIAGNOSIS — E559 Vitamin D deficiency, unspecified: Secondary | ICD-10-CM | POA: Diagnosis not present

## 2018-02-08 DIAGNOSIS — E782 Mixed hyperlipidemia: Secondary | ICD-10-CM

## 2018-02-08 DIAGNOSIS — K219 Gastro-esophageal reflux disease without esophagitis: Secondary | ICD-10-CM

## 2018-02-08 DIAGNOSIS — Z79899 Other long term (current) drug therapy: Secondary | ICD-10-CM

## 2018-02-08 DIAGNOSIS — I2510 Atherosclerotic heart disease of native coronary artery without angina pectoris: Secondary | ICD-10-CM | POA: Diagnosis not present

## 2018-02-08 DIAGNOSIS — Z87891 Personal history of nicotine dependence: Secondary | ICD-10-CM | POA: Diagnosis not present

## 2018-02-08 DIAGNOSIS — F339 Major depressive disorder, recurrent, unspecified: Secondary | ICD-10-CM

## 2018-02-08 DIAGNOSIS — E1122 Type 2 diabetes mellitus with diabetic chronic kidney disease: Secondary | ICD-10-CM | POA: Diagnosis not present

## 2018-02-08 DIAGNOSIS — Z9861 Coronary angioplasty status: Secondary | ICD-10-CM

## 2018-02-08 DIAGNOSIS — R55 Syncope and collapse: Secondary | ICD-10-CM

## 2018-02-08 DIAGNOSIS — I10 Essential (primary) hypertension: Secondary | ICD-10-CM | POA: Diagnosis not present

## 2018-02-08 DIAGNOSIS — Z136 Encounter for screening for cardiovascular disorders: Secondary | ICD-10-CM | POA: Diagnosis not present

## 2018-02-08 DIAGNOSIS — Z794 Long term (current) use of insulin: Secondary | ICD-10-CM

## 2018-02-08 DIAGNOSIS — N182 Chronic kidney disease, stage 2 (mild): Secondary | ICD-10-CM

## 2018-02-08 DIAGNOSIS — Z8546 Personal history of malignant neoplasm of prostate: Secondary | ICD-10-CM

## 2018-02-08 MED ORDER — BUPROPION HCL ER (XL) 300 MG PO TB24: ORAL_TABLET | ORAL | 3 refills | Status: DC

## 2018-02-09 LAB — LIPID PANEL
CHOLESTEROL: 109 mg/dL (ref <200)
HDL: 45 mg/dL (ref >40)
LDL Cholesterol (Calc): 38 mg/dL (calc)
Non-HDL Cholesterol (Calc): 64 mg/dL (calc) (ref <130)
TRIGLYCERIDES: 193 mg/dL — AB (ref <150)
Total CHOL/HDL Ratio: 2.4 (calc) (ref <5.0)

## 2018-02-09 LAB — COMPLETE METABOLIC PANEL WITH GFR
AG Ratio: 2.2 (calc) (ref 1.0–2.5)
ALKALINE PHOSPHATASE (APISO): 66 U/L (ref 40–115)
ALT: 51 U/L — AB (ref 9–46)
AST: 33 U/L (ref 10–35)
Albumin: 5 g/dL (ref 3.6–5.1)
BILIRUBIN TOTAL: 0.5 mg/dL (ref 0.2–1.2)
BUN: 12 mg/dL (ref 7–25)
CHLORIDE: 102 mmol/L (ref 98–110)
CO2: 27 mmol/L (ref 20–32)
Calcium: 10.1 mg/dL (ref 8.6–10.3)
Creat: 1.08 mg/dL (ref 0.70–1.18)
GFR, Est African American: 80 mL/min/{1.73_m2} (ref >=60)
GFR, Est Non African American: 69 mL/min/{1.73_m2} (ref >=60)
GLUCOSE: 99 mg/dL (ref 65–99)
Globulin: 2.3 g/dL (calc) (ref 1.9–3.7)
Potassium: 4.3 mmol/L (ref 3.5–5.3)
Sodium: 140 mmol/L (ref 135–146)
Total Protein: 7.3 g/dL (ref 6.1–8.1)

## 2018-02-09 LAB — CBC WITH DIFFERENTIAL/PLATELET
Basophils Absolute: 41 cells/uL (ref 0–200)
Basophils Relative: 0.9 %
EOS ABS: 153 {cells}/uL (ref 15–500)
EOS PCT: 3.4 %
HCT: 39 % (ref 38.5–50.0)
Hemoglobin: 13.2 g/dL (ref 13.2–17.1)
Lymphs Abs: 734 cells/uL — ABNORMAL LOW (ref 850–3900)
MCH: 29.7 pg (ref 27.0–33.0)
MCHC: 33.8 g/dL (ref 32.0–36.0)
MCV: 87.6 fL (ref 80.0–100.0)
MONOS PCT: 12.8 %
MPV: 11.3 fL (ref 7.5–12.5)
Neutro Abs: 2997 cells/uL (ref 1500–7800)
Neutrophils Relative %: 66.6 %
PLATELETS: 197 10*3/uL (ref 140–400)
RBC: 4.45 10*6/uL (ref 4.20–5.80)
RDW: 13.5 % (ref 11.0–15.0)
TOTAL LYMPHOCYTE: 16.3 %
WBC mixed population: 576 cells/uL (ref 200–950)
WBC: 4.5 10*3/uL (ref 3.8–10.8)

## 2018-02-09 LAB — HEMOGLOBIN A1C
HEMOGLOBIN A1C: 9.4 %{Hb} — AB (ref <5.7)
MEAN PLASMA GLUCOSE: 223 (calc)
eAG (mmol/L): 12.4 (calc)

## 2018-02-09 LAB — MAGNESIUM: MAGNESIUM: 1.8 mg/dL (ref 1.5–2.5)

## 2018-02-09 LAB — PSA: PSA: <0.1 ng/mL (ref <=4.0)

## 2018-02-09 LAB — TSH: TSH: 0.58 mIU/L (ref 0.40–4.50)

## 2018-02-09 LAB — VITAMIN D 25 HYDROXY (VIT D DEFICIENCY, FRACTURES): Vit D, 25-Hydroxy: 86 ng/mL (ref 30–100)

## 2018-02-10 ENCOUNTER — Ambulatory Visit: Payer: Self-pay | Admitting: Adult Health

## 2018-02-27 ENCOUNTER — Other Ambulatory Visit: Payer: Self-pay | Admitting: Internal Medicine

## 2018-02-27 ENCOUNTER — Encounter: Payer: Self-pay | Admitting: Internal Medicine

## 2018-02-27 DIAGNOSIS — G894 Chronic pain syndrome: Secondary | ICD-10-CM

## 2018-02-27 MED ORDER — DULOXETINE HCL 60 MG PO CPEP: 60.0000 mg | ORAL_CAPSULE | Freq: Every day | ORAL | 2 refills | Status: DC

## 2018-03-03 ENCOUNTER — Other Ambulatory Visit: Payer: Self-pay | Admitting: Physician Assistant

## 2018-03-14 ENCOUNTER — Encounter: Payer: Self-pay | Admitting: Internal Medicine

## 2018-03-15 DIAGNOSIS — M48061 Spinal stenosis, lumbar region without neurogenic claudication: Secondary | ICD-10-CM | POA: Diagnosis not present

## 2018-03-26 ENCOUNTER — Other Ambulatory Visit: Payer: Self-pay | Admitting: Internal Medicine

## 2018-03-28 DIAGNOSIS — M25551 Pain in right hip: Secondary | ICD-10-CM | POA: Diagnosis not present

## 2018-03-29 ENCOUNTER — Encounter: Payer: Self-pay | Admitting: Internal Medicine

## 2018-04-05 ENCOUNTER — Other Ambulatory Visit: Payer: Self-pay | Admitting: Internal Medicine

## 2018-04-05 DIAGNOSIS — M8949 Other hypertrophic osteoarthropathy, multiple sites: Secondary | ICD-10-CM

## 2018-04-05 DIAGNOSIS — R61 Generalized hyperhidrosis: Secondary | ICD-10-CM

## 2018-04-05 DIAGNOSIS — M150 Primary generalized (osteo)arthritis: Principal | ICD-10-CM

## 2018-04-05 DIAGNOSIS — M159 Polyosteoarthritis, unspecified: Secondary | ICD-10-CM

## 2018-04-18 ENCOUNTER — Encounter: Payer: Self-pay | Admitting: Internal Medicine

## 2018-04-19 DIAGNOSIS — M25551 Pain in right hip: Secondary | ICD-10-CM | POA: Diagnosis not present

## 2018-04-21 DIAGNOSIS — H2513 Age-related nuclear cataract, bilateral: Secondary | ICD-10-CM | POA: Diagnosis not present

## 2018-04-21 DIAGNOSIS — Z794 Long term (current) use of insulin: Secondary | ICD-10-CM | POA: Diagnosis not present

## 2018-04-21 DIAGNOSIS — H40013 Open angle with borderline findings, low risk, bilateral: Secondary | ICD-10-CM | POA: Diagnosis not present

## 2018-04-21 DIAGNOSIS — E119 Type 2 diabetes mellitus without complications: Secondary | ICD-10-CM | POA: Diagnosis not present

## 2018-04-21 DIAGNOSIS — H524 Presbyopia: Secondary | ICD-10-CM | POA: Diagnosis not present

## 2018-04-21 LAB — HM DIABETES EYE EXAM

## 2018-04-22 ENCOUNTER — Encounter: Payer: Self-pay | Admitting: Internal Medicine

## 2018-05-02 ENCOUNTER — Other Ambulatory Visit: Payer: Self-pay | Admitting: Internal Medicine

## 2018-05-02 MED ORDER — NITROGLYCERIN 0.4 MG SL SUBL: SUBLINGUAL_TABLET | SUBLINGUAL | 6 refills | Status: DC

## 2018-05-20 ENCOUNTER — Ambulatory Visit: Payer: Medicare Other | Admitting: Internal Medicine

## 2018-05-24 DIAGNOSIS — M25551 Pain in right hip: Secondary | ICD-10-CM | POA: Diagnosis not present

## 2018-05-26 DIAGNOSIS — M7061 Trochanteric bursitis, right hip: Secondary | ICD-10-CM | POA: Diagnosis not present

## 2018-05-26 DIAGNOSIS — M25551 Pain in right hip: Secondary | ICD-10-CM | POA: Diagnosis not present

## 2018-05-30 DIAGNOSIS — I700 Atherosclerosis of aorta: Secondary | ICD-10-CM | POA: Insufficient documentation

## 2018-05-30 NOTE — Progress Notes (Signed)
MEDICARE ANNUAL WELLNESS VISIT AND FOLLOW UP Assessment:    Encounter for Medicare annual wellness exam 1 year  Essential hypertension - continue medications, DASH diet, exercise and monitor at home. Call if greater than 130/80.  -     CBC with Differential/Platelet -     CMP/GFR  Type 2 diabetes mellitus with stage 2 chronic kidney disease, with long-term current use of insulin ([place]) Followed by Dr. Cruzita Lederer - reminded to schedule follow up Education: Reviewed 'ABCs' of diabetes management (respective goals in parentheses):  A1C (<7), blood pressure (<130/80), and cholesterol (LDL <70) Eye Exam yearly and Dental Exam every 6 months. Dietary recommendations Physical Activity recommendations -     Hemoglobin A1c  ASCAD s/p PTCA (06/2012) Control blood pressure, cholesterol, glucose, increase exercise.  Followed by cardiology  Gastroesophageal reflux disease without esophagitis Continue PPI/H2 blocker, diet discussed  Diabetic peripheral neuropathy ([place]) Check feet daily -     Hemoglobin A1c  Thyroiditis Monitor, normal scan 2017, followed by Dr. Cruzita Lederer -     TSH  Malignant neoplasm of prostate ([place]) Continue follow up  Mixed hyperlipidemia -continue medications, check lipids, decrease fatty foods, increase activity.  -     Lipid panel  Medication management  Obesity Long discussion about weight loss, diet, and exercise Recommended diet heavy in fruits and veggies and low in animal meats, cheeses, and dairy products, appropriate calorie intake Patient will work on walking daily 10-15 min, twice a day if can, with wife Discussed appropriate weight for height  Follow up at next visit  Atherosclerosis of aorta ([place]) Control blood pressure, cholesterol, glucose, increase exercise.    Over 30 minutes of exam, counseling, chart review, and critical decision making was performed  Future Appointments  Date Time Provider [place]  08/25/2018  9:15 AM Philemon Kingdom, MD [facility] None  08/30/2018 10:30 AM [name], MD [facility] None  03/07/2019 10:00 AM [name], MD [facility] None     Plan:   During the course of the visit the patient was educated and counseled about appropriate screening and preventive services including:    Pneumococcal vaccine   Influenza vaccine  Prevnar 13  Td vaccine  Screening electrocardiogram  Colorectal cancer screening  Diabetes screening  Glaucoma screening  Nutrition counseling    Subjective:  Michael Guzman is a 72 y.o. male who presents accompanied by his wife for Medicare Annual Wellness Visit and 3 month follow up for HTN, hyperlipidemia, T2 diabetes, and vitamin D Def. He has hx of ASCAD s/p PTCA (06/2012), last stress test 04/2016, followed by Dr. Marlou Porch; hx of prostate cancer treated by radiation in 2016-2017; he is followed by Dr. Junious Silk, recently completed lupron shots this past April. He is now also followed by Dr. Cruzita Lederer for T2DM due to poor control.   He is followed by [facility] for right hip pain and getting injections, pain well controlled and not recently having. Has been advised surgery not necessary at this time.   Some concern of ongoing depression; currently treated by wellbutrin 300 mg daily, cymbalta 60 mg daily. Patient states he is not depressed. He reports hasn't been able to fish due to right hip pain and is somewhat down due to this, but reports otherwise his mood is fine. Encouraged him to resume fishing and other previous activities.   BMI is Body mass index is 31.16 kg/m., he has not been working on diet and exercise. Wt Readings from Last 3 Encounters:  05/31/18 211 lb (95.7  kg)  02/08/18 214 lb 3.2 oz (97.2 kg)  02/02/18 212 lb 9.6 oz (96.4 kg)   His blood pressure has been controlled at home, today their BP is BP: 110/72 He does not workout. He denies chest pain, shortness of breath, dizziness.   He is on cholesterol medication  Atorvastatin 20 mg daily and denies myalgias. His cholesterol is at goal. The cholesterol last visit was:   Lab Results  Component Value Date   CHOL 109 02/08/2018   HDL 45 02/08/2018   LDLCALC 38 02/08/2018   LDLDIRECT 45.7 10/30/2013   TRIG 193 (H) 02/08/2018   CHOLHDL 2.4 02/08/2018   He has been working on diet and exercise for Diabetes with diabetic chronic kidney disease, with other circulatory complications and with diabetic polyneuropathy , he is on bASA, he is not on ACE/ARB, sugars in the AM are 190-200,  Currently taking novolin N 40 units BID and novolin R 35 units BID, on metformin 2000 mg daily, was on trulicity but off due to cost in donut hole. He denies paresthesia of the feet, polydipsia and polyuria. He follows with Dr. Renne Crigler but admits he had appointment last week but cancelled.  Last A1C was:  Lab Results  Component Value Date   HGBA1C 9.4 (H) 02/08/2018   Lab Results  Component Value Date   GFRAA 36 02/08/2018   He has a history of thyroiditis.  Lab Results  Component Value Date   TSH 0.58 02/08/2018  . Patient is on Vitamin D supplement.   Lab Results  Component Value Date   VD25OH 86 02/08/2018       Medication Review: Current Outpatient Medications on File Prior to Visit  Medication Sig Dispense Refill  . aspirin EC 81 MG tablet Take 81 mg by mouth daily.    Marland Kitchen atorvastatin (LIPITOR) 20 MG tablet TAKE 1 TABLET BY MOUTH  DAILY 90 tablet 3  . Blood Glucose Monitoring Suppl (ONE TOUCH ULTRA SYSTEM KIT) w/Device KIT Check blood sugar 1 time daily-[id number] 1 each 0  . buPROPion (WELLBUTRIN XL) 300 MG 24 hr tablet Take 1 tablet every morning 90 tablet 3  . Cholecalciferol (VITAMIN D3) 5000 UNITS CAPS Take 5,000 Units by mouth 2 (two) times daily.    . DULoxetine (CYMBALTA) 60 MG capsule Take 1 capsule (60 mg total) by mouth daily. 30 capsule 2  . fluticasone (FLONASE) 50 MCG/ACT nasal spray Place 2 sprays into both nostrils daily. 16 g 0  . furosemide  (LASIX) 40 MG tablet Take 1 tablet 2 x/ day as needed  for fluid retention 60 tablet 1  . gabapentin (NEURONTIN) 800 MG tablet TAKE 1 TABLET BY MOUTH 3 TO 4 TIMES DAILY FOR PAIN, HOT FLASHES & SWEATS 360 tablet 1  . glucose blood (ONE TOUCH ULTRA TEST) test strip USE WITH METER TO CHECK  BLOOD SUGAR 2 TIMES DAILY 200 each 3  . insulin NPH Human (HUMULIN N,NOVOLIN N) 100 UNIT/ML injection Inject 40 Units into the skin 2 (two) times daily before a meal.    . insulin regular (NOVOLIN R,HUMULIN R) 100 units/mL injection Inject 35 Units into the skin 2 (two) times daily before a meal.    . Magnesium 250 MG TABS Take 250 mg by mouth daily.    . metFORMIN (GLUCOPHAGE-XR) 500 MG 24 hr tablet TAKE 1 TO 2 TABLETS BY  MOUTH TWO TIMES DAILY AS  DIRECTED FOR DIABETES (Patient taking differently: Take 1,000 mg by mouth 2 (two) times daily. ) 360  tablet 3  . metoprolol succinate (TOPROL-XL) 25 MG 24 hr tablet TAKE ONE-HALF TABLET BY  MOUTH DAILY. TAKE WITH OR  IMMEDIATELY FOLLOWING A  MEAL. (Patient taking differently: Take 12.5 mg by mouth daily. TAKE ONE-HALF TABLET BY  MOUTH DAILY. TAKE WITH OR  IMMEDIATELY FOLLOWING A  MEAL.) 45 tablet 3  . montelukast (SINGULAIR) 10 MG tablet TAKE 1 TABLET BY MOUTH AT  BEDTIME 90 tablet 3  . Multiple Vitamin (MULTIVITAMIN WITH MINERALS) TABS tablet Take 1 tablet by mouth daily.    . nitroGLYCERIN (NITROSTAT) 0.4 MG SL tablet PLACE 1 TABLET UNDER THE TONGUE EVERY 5 MINUTES AS NEEDED FOR CHEST PAIN 25 tablet 6  . oxybutynin (DITROPAN) 5 MG tablet Take 5 mg by mouth daily.  11  . pantoprazole (PROTONIX) 40 MG tablet TAKE 1 TABLET BY MOUTH  DAILY 90 tablet 1  . ranitidine (ZANTAC) 150 MG tablet Take 1 tablet (150 mg total) by mouth 2 (two) times daily. (Patient taking differently: Take 150 mg by mouth every other day. ) 180 tablet 1  . tamsulosin (FLOMAX) 0.4 MG CAPS capsule Take 0.4 mg by mouth at bedtime.    Marland Kitchen tiZANidine (ZANAFLEX) 4 MG tablet Take 1 tablet (4 mg total) by mouth  2 (two) times daily as needed for muscle spasms. 30 tablet 0  . Dulaglutide (TRULICITY) 1.5 IR/6.7EL SOPN Inject 1.5 mg into the skin once a week. (Patient not taking: Reported on 05/31/2018) 4 pen 11   No current facility-administered medications on file prior to visit.     Allergies: No Known Allergies  Current Problems (verified) has Hypertension (1991); Poorly controlled type 2 diabetes mellitus with peripheral neuropathy ([place]); GERD; ASCAD s/p PTCA (06/2012); Mixed hyperlipidemia; Vitamin D deficiency; Medication management; Diabetic peripheral neuropathy ([place]); Malignant neoplasm of prostate ([place]); Thyroiditis; Obesity (BMI 30.0-34.9); Personal history of prostate cancer; Type 2 diabetes mellitus with vascular disease ([place]); and Aortic atherosclerosis ([place]) on their problem list.  Screening Tests Immunization History  Administered Date(s) Administered  . Influenza Split 06/26/2012  . Influenza, High Dose Seasonal PF 06/20/2015, 06/23/2016, 06/14/2017  . Influenza-Unspecified 06/14/2013, 06/25/2014  . Pneumococcal Conjugate-13 04/23/2016  . Pneumococcal Polysaccharide-23 06/14/2017  . Pneumococcal-Unspecified 06/16/2010  . Td 09/17/2003  . Tdap 11/26/2013  . Zoster 07/12/2012    Preventative care: Last colonoscopy: 2011 Echo 3013 Stress test 2017 Bone scan 04/2016 Thyroid 2017 Ct head 2018  Prior vaccinations: TD or Tdap: 2015  Influenza: 2018  Pneumococcal: 2011, 2018 Prevnar13: 2017 Shingles/Zostavax: 2013  Names of Other Physician/Practitioners you currently use: 1. [facility] here for primary care 2. Dr. Gershon Crane, eye doctor, last visit April 21, 2018 - report verified and abstracted 3. [facility], dentist, last visit 05/2018  Patient Care Team: [name], MD as PCP - General (Internal Medicine)  Surgical: He  has a past surgical history that includes Appendectomy; Cardiac surgery; left heart  catheterization with coronary angiogram (N/A, 06/27/2012); left heart catheterization with coronary angiogram (N/A, 07/25/2012); Colonoscopy (02/12/2010); Radiology with anesthesia (N/A, 12/03/2015); and Prostate biopsy. Family His family history includes Diabetes in his sister. Social history  He reports that he quit smoking about 6 years ago. He quit after 50.00 years of use. He has never used smokeless tobacco. He reports that he drinks alcohol. He reports that he does not use drugs.  MEDICARE WELLNESS OBJECTIVES: Physical activity: Current Exercise Habits: The patient does not participate in regular exercise at present, Exercise limited by: orthopedic condition(s) Cardiac risk factors:  Cardiac Risk Factors include: advanced age (>63mn, >>1women);diabetes mellitus;dyslipidemia;hypertension;male gender;obesity (BMI >30kg/m2);sedentary lifestyle;smoking/ tobacco exposure Depression/mood screen:   Depression screen [facility] 05/31/2018  Decreased Interest 1  Down, Depressed, Hopeless 1  PHQ - 2 Score 2  Altered sleeping 0  Tired, decreased energy 3  Change in appetite 0  Feeling bad or failure about yourself  0  Trouble concentrating 0  Moving slowly or fidgety/restless 0  Suicidal thoughts 0  PHQ-9 Score 5  Difficult doing work/chores Somewhat difficult  Some recent data might be hidden    ADLs:  In your present state of health, do you have any difficulty performing the following activities: 05/31/2018 02/08/2018  Hearing? N N  Vision? N N  Difficulty concentrating or making decisions? N N  Walking or climbing stairs? N N  Comment doing well with hip injections -  Dressing or bathing? N N  Doing errands, shopping? N N  Some recent data might be hidden     Cognitive Testing  Alert? Yes  Normal Appearance?Yes  Oriented to person? Yes  Place? Yes   Time? Yes  Recall of three objects?  Yes  Can perform simple calculations? Yes  Displays appropriate judgment?Yes  Can read the  correct time from a watch face?Yes  EOL planning: Does Patient Have a Medical Advance Directive?: Yes Type of Advance Directive: Healthcare Power of Attorney, Living will Does patient want to make changes to medical advance directive?: No - Patient declined Copy of [place] Chart?: Yes   Objective:   Today's Vitals   05/31/18 1101  BP: 110/72  Pulse: 77  Temp: (!) 97.5 F (36.4 C)  SpO2: 97%  Weight: 211 lb (95.7 kg)  Height: _0  (1.753 m)   Body mass index is 31.16 kg/m.  General appearance: alert, no distress, WD/WN, male HEENT: normocephalic, sclerae anicteric, TMs pearly, nares patent, no discharge or erythema, pharynx normal Oral cavity: MMM, no lesions Neck: supple, no lymphadenopathy, no thyromegaly, no masses Heart: RRR, normal S1, S2, no murmurs Lungs: CTA bilaterally, no wheezes, rhonchi, or rales Abdomen: +bs, soft, non tender, non distended, no masses, no hepatomegaly, no splenomegaly Musculoskeletal: nontender, no swelling, no obvious deformity Extremities: no edema, no cyanosis, no clubbing Pulses: 2+ symmetric, upper and lower extremities, normal cap refill Neurological: alert, oriented x 3, CN2-12 intact, strength normal upper extremities and lower extremities, sensation decreased bilateral feet, DTRs 2+ throughout, no cerebellar signs, gait normal Psychiatric: depressed affect, behavior normal  Medicare Attestation I have personally reviewed: The patient's medical and social history Their use of alcohol, tobacco or illicit drugs Their current medications and supplements The patient's functional ability including ADLs,fall risks, home safety risks, cognitive, and hearing and visual impairment Diet and physical activities Evidence for depression or mood disorders  The patient's weight, height, BMI, and visual acuity have been recorded in the chart.  I have made referrals, counseling, and provided education to the patient based on  review of the above and I have provided the patient with a written personalized care plan for preventive services.     AIzora Ribas NP   05/31/2018

## 2018-05-31 ENCOUNTER — Encounter: Payer: Self-pay | Admitting: Adult Health

## 2018-05-31 ENCOUNTER — Ambulatory Visit (INDEPENDENT_AMBULATORY_CARE_PROVIDER_SITE_OTHER): Payer: Medicare Other | Admitting: Adult Health

## 2018-05-31 VITALS — BP 110/72 | HR 77 | Temp 97.5°F | Ht 69.0 in | Wt 211.0 lb

## 2018-05-31 DIAGNOSIS — N182 Chronic kidney disease, stage 2 (mild): Secondary | ICD-10-CM

## 2018-05-31 DIAGNOSIS — I2510 Atherosclerotic heart disease of native coronary artery without angina pectoris: Secondary | ICD-10-CM | POA: Diagnosis not present

## 2018-05-31 DIAGNOSIS — E782 Mixed hyperlipidemia: Secondary | ICD-10-CM

## 2018-05-31 DIAGNOSIS — E069 Thyroiditis, unspecified: Secondary | ICD-10-CM | POA: Diagnosis not present

## 2018-05-31 DIAGNOSIS — E669 Obesity, unspecified: Secondary | ICD-10-CM

## 2018-05-31 DIAGNOSIS — I700 Atherosclerosis of aorta: Secondary | ICD-10-CM

## 2018-05-31 DIAGNOSIS — C61 Malignant neoplasm of prostate: Secondary | ICD-10-CM

## 2018-05-31 DIAGNOSIS — I10 Essential (primary) hypertension: Secondary | ICD-10-CM | POA: Diagnosis not present

## 2018-05-31 DIAGNOSIS — R6889 Other general symptoms and signs: Secondary | ICD-10-CM

## 2018-05-31 DIAGNOSIS — K219 Gastro-esophageal reflux disease without esophagitis: Secondary | ICD-10-CM

## 2018-05-31 DIAGNOSIS — E1142 Type 2 diabetes mellitus with diabetic polyneuropathy: Secondary | ICD-10-CM

## 2018-05-31 DIAGNOSIS — Z79899 Other long term (current) drug therapy: Secondary | ICD-10-CM

## 2018-05-31 DIAGNOSIS — E1122 Type 2 diabetes mellitus with diabetic chronic kidney disease: Secondary | ICD-10-CM

## 2018-05-31 DIAGNOSIS — Z0001 Encounter for general adult medical examination with abnormal findings: Secondary | ICD-10-CM

## 2018-05-31 DIAGNOSIS — E1159 Type 2 diabetes mellitus with other circulatory complications: Secondary | ICD-10-CM

## 2018-05-31 DIAGNOSIS — E66811 Obesity, class 1: Secondary | ICD-10-CM

## 2018-05-31 DIAGNOSIS — E559 Vitamin D deficiency, unspecified: Secondary | ICD-10-CM

## 2018-05-31 DIAGNOSIS — Z0000 Encounter for general adult medical examination without abnormal findings: Secondary | ICD-10-CM

## 2018-05-31 DIAGNOSIS — Z794 Long term (current) use of insulin: Secondary | ICD-10-CM

## 2018-05-31 DIAGNOSIS — Z9861 Coronary angioplasty status: Secondary | ICD-10-CM

## 2018-05-31 NOTE — Patient Instructions (Addendum)
[name] , Thank you for taking time to come for your Medicare Wellness Visit. I appreciate your ongoing commitment to your health goals. Please review the following plan we discussed and let me know if I can assist you in the future.   These are the goals we discussed: Goals    . Exercise 5 x per week (15 min per time)    . HEMOGLOBIN A1C < 8.0    . Weight (lb) < 200 lb (90.7 kg)       This is a list of the screening recommended for you and due dates:  Health Maintenance  Topic Date Due  . Urine Protein Check  01/15/2018  . Flu Shot  06/07/2018*  . Hemoglobin A1C  08/11/2018  . Complete foot exam   02/08/2019  . Eye exam for diabetics  04/22/2019  . Colon Cancer Screening  02/13/2020  . Tetanus Vaccine  11/27/2023  .  Hepatitis C: One time screening is recommended by Center for Disease Control  (CDC) for  adults born from [birth year] through 1964.   Completed  . Pneumonia vaccines  Completed  *Topic was postponed. The date shown is not the original due date.    Please schedule a follow up with Dr. Cruzita Lederer  Please go fishing at least once a week and stay out for at least 2 hours    Preventing Type 2 Diabetes Mellitus Type 2 diabetes (type 2 diabetes mellitus) is a long-term (chronic) disease that affects blood sugar (glucose) levels. Normally, a hormone called insulin allows glucose to enter cells in the body. The cells use glucose for energy. In type 2 diabetes, one or both of these problems may be present:  The body does not make enough insulin.  The body does not respond properly to insulin that it makes (insulin resistance).  Insulin resistance or lack of insulin causes excess glucose to build up in the blood instead of going into cells. As a result, high blood glucose (hyperglycemia) develops, which can cause many complications. Being overweight or obese and having an inactive (sedentary) lifestyle can increase your risk for diabetes. Type 2 diabetes can be delayed or  prevented by making certain nutrition and lifestyle changes. What nutrition changes can be made?  Eat healthy meals and snacks regularly. Keep a healthy snack with you for when you get hungry between meals, such as fruit or a handful of nuts.  Eat lean meats and proteins that are low in saturated fats, such as chicken, fish, egg whites, and beans. Avoid processed meats.  Eat plenty of fruits and vegetables and plenty of grains that have not been processed (whole grains). It is recommended that you eat: ? 1?2 cups of fruit every day. ? 2?3 cups of vegetables every day. ? 6?8 oz of whole grains every day, such as oats, whole wheat, bulgur, brown rice, quinoa, and millet.  Eat low-fat dairy products, such as milk, yogurt, and cheese.  Eat foods that contain healthy fats, such as nuts, avocado, olive oil, and canola oil.  Drink water throughout the day. Avoid drinks that contain added sugar, such as soda or sweet tea.  Follow instructions from your health care provider about specific eating or drinking restrictions.  Control how much food you eat at a time (portion size). ? Check food labels to find out the serving sizes of foods. ? Use a kitchen scale to weigh amounts of foods.  Saute or steam food instead of frying it. Cook with water or broth  instead of oils or butter.  Limit your intake of: ? Salt (sodium). Have no more than 1 tsp (2,400 mg) of sodium a day. If you have heart disease or high blood pressure, have less than ? tsp (1,500 mg) of sodium a day. ? Saturated fat. This is fat that is solid at room temperature, such as butter or fat on meat. What lifestyle changes can be made?  Activity  Do moderate-intensity physical activity for at least 30 minutes on at least 5 days of the week, or as much as told by your health care provider.  Ask your health care provider what activities are safe for you. A mix of physical activities may be best, such as walking, swimming, cycling,  and strength training.  Try to add physical activity into your day. For example: ? Park in spots that are farther away than usual, so that you walk more. For example, park in a far corner of the parking lot when you go to the office or the grocery store. ? Take a walk during your lunch break. ? Use stairs instead of elevators or escalators. Weight Loss  Lose weight as directed. Your health care provider can determine how much weight loss is best for you and can help you lose weight safely.  If you are overweight or obese, you may be instructed to lose at least 5?7 % of your body weight. Alcohol and Tobacco   Limit alcohol intake to no more than 1 drink a day for nonpregnant women and 2 drinks a day for men. One drink equals 12 oz of beer, 5 oz of wine, or 1 oz of hard liquor.  Do not use any tobacco products, such as cigarettes, chewing tobacco, and e-cigarettes. If you need help quitting, ask your health care provider. Work With [place] Provider  Have your blood glucose tested regularly, as told by your health care provider.  Discuss your risk factors and how you can reduce your risk for diabetes.  Get screening tests as told by your health care provider. You may have screening tests regularly, especially if you have certain risk factors for type 2 diabetes.  Make an appointment with a diet and nutrition specialist (registered dietitian). A registered dietitian can help you make a healthy eating plan and can help you understand portion sizes and food labels. Why are these changes important?  It is possible to prevent or delay type 2 diabetes and related health problems by making lifestyle and nutrition changes.  It can be difficult to recognize signs of type 2 diabetes. The best way to avoid possible damage to your body is to take actions to prevent the disease before you develop symptoms. What can happen if changes are not made?  Your blood glucose levels may keep  increasing. Having high blood glucose for a long time is dangerous. Too much glucose in your blood can damage your blood vessels, heart, kidneys, nerves, and eyes.  You may develop prediabetes or type 2 diabetes. Type 2 diabetes can lead to many chronic health problems and complications, such as: ? Heart disease. ? Stroke. ? Blindness. ? Kidney disease. ? Depression. ? Poor circulation in the feet and legs, which could lead to surgical removal (amputation) in severe cases. Where to find support:  Ask your health care provider to recommend a registered dietitian, diabetes educator, or weight loss program.  Look for local or online weight loss groups.  Join a gym, fitness club, or outdoor activity group, such  as a walking club. Where to find more information: To learn more about diabetes and diabetes prevention, visit:  American Diabetes Association (ADA): www.diabetes.[employer] of Diabetes and Digestive and Kidney Diseases: FindSpin.nl  To learn more about healthy eating, visit:  The U.S. Department of Agriculture Scientist, research (physical sciences)), Choose My Plate: http://wiley-williams.com/  Office of Disease Prevention and Health Promotion (ODPHP), Dietary Guidelines: SurferLive.at  Summary  You can reduce your risk for type 2 diabetes by increasing your physical activity, eating healthy foods, and losing weight as directed.  Talk with your health care provider about your risk for type 2 diabetes. Ask about any blood tests or screening tests that you need to have. This information is not intended to replace advice given to you by your health care provider. Make sure you discuss any questions you have with your health care provider. Document Released: 12/23/2015 Document Revised: 02/06/2016 Document Reviewed: 10/22/2015 Elsevier Interactive Patient Education  [employer].

## 2018-06-01 ENCOUNTER — Encounter: Payer: Self-pay | Admitting: Adult Health

## 2018-06-01 DIAGNOSIS — E1121 Type 2 diabetes mellitus with diabetic nephropathy: Secondary | ICD-10-CM | POA: Insufficient documentation

## 2018-06-01 LAB — CBC WITH DIFFERENTIAL/PLATELET
BASOS PCT: 0.7 %
Basophils Absolute: 53 cells/uL (ref 0–200)
EOS PCT: 1.8 %
Eosinophils Absolute: 137 cells/uL (ref 15–500)
HEMATOCRIT: 39.3 % (ref 38.5–50.0)
Hemoglobin: 13.3 g/dL (ref 13.2–17.1)
Lymphs Abs: 1208 cells/uL (ref 850–3900)
MCH: 30.1 pg (ref 27.0–33.0)
MCHC: 33.8 g/dL (ref 32.0–36.0)
MCV: 88.9 fL (ref 80.0–100.0)
MPV: 11.4 fL (ref 7.5–12.5)
Monocytes Relative: 9.8 %
Neutro Abs: 5457 cells/uL (ref 1500–7800)
Neutrophils Relative %: 71.8 %
PLATELETS: 211 10*3/uL (ref 140–400)
RBC: 4.42 10*6/uL (ref 4.20–5.80)
RDW: 12.9 % (ref 11.0–15.0)
Total Lymphocyte: 15.9 %
WBC mixed population: 745 cells/uL (ref 200–950)
WBC: 7.6 10*3/uL (ref 3.8–10.8)

## 2018-06-01 LAB — HEMOGLOBIN A1C
Hgb A1c MFr Bld: 10.6 % of total Hgb — ABNORMAL HIGH (ref <5.7)
MEAN PLASMA GLUCOSE: 258 (calc)
eAG (mmol/L): 14.3 (calc)

## 2018-06-01 LAB — URINALYSIS, ROUTINE W REFLEX MICROSCOPIC
Bilirubin Urine: NEGATIVE
HGB URINE DIPSTICK: NEGATIVE
Ketones, ur: NEGATIVE
LEUKOCYTES UA: NEGATIVE
Nitrite: NEGATIVE
PH: 5.5 (ref 5.0–8.0)
Protein, ur: NEGATIVE
SPECIFIC GRAVITY, URINE: 1.04 — AB (ref 1.001–1.03)

## 2018-06-01 LAB — COMPLETE METABOLIC PANEL WITH GFR
AG RATIO: 2 (calc) (ref 1.0–2.5)
ALKALINE PHOSPHATASE (APISO): 73 U/L (ref 40–115)
ALT: 37 U/L (ref 9–46)
AST: 29 U/L (ref 10–35)
Albumin: 4.7 g/dL (ref 3.6–5.1)
BILIRUBIN TOTAL: 0.3 mg/dL (ref 0.2–1.2)
BUN: 18 mg/dL (ref 7–25)
CALCIUM: 10 mg/dL (ref 8.6–10.3)
CHLORIDE: 104 mmol/L (ref 98–110)
CO2: 23 mmol/L (ref 20–32)
Creat: 1.09 mg/dL (ref 0.70–1.18)
GFR, Est African American: 79 mL/min/{1.73_m2} (ref >=60)
GFR, Est Non African American: 68 mL/min/{1.73_m2} (ref >=60)
GLOBULIN: 2.3 g/dL (ref 1.9–3.7)
Glucose, Bld: 275 mg/dL — ABNORMAL HIGH (ref 65–99)
POTASSIUM: 4.2 mmol/L (ref 3.5–5.3)
SODIUM: 138 mmol/L (ref 135–146)
Total Protein: 7 g/dL (ref 6.1–8.1)

## 2018-06-01 LAB — MICROALBUMIN / CREATININE URINE RATIO
Creatinine, Urine: 77 mg/dL (ref 20–320)
MICROALB/CREAT RATIO: 34 ug/mg{creat} — AB (ref <30)
Microalb, Ur: 2.6 mg/dL

## 2018-06-01 LAB — LIPID PANEL
CHOL/HDL RATIO: 2.5 (calc) (ref <5.0)
CHOLESTEROL: 104 mg/dL (ref <200)
HDL: 41 mg/dL (ref >40)
LDL Cholesterol (Calc): 37 mg/dL (calc)
Non-HDL Cholesterol (Calc): 63 mg/dL (calc) (ref <130)
Triglycerides: 180 mg/dL — ABNORMAL HIGH (ref <150)

## 2018-06-01 LAB — TSH: TSH: 0.5 mIU/L (ref 0.40–4.50)

## 2018-06-01 LAB — MAGNESIUM: MAGNESIUM: 2.1 mg/dL (ref 1.5–2.5)

## 2018-06-20 DIAGNOSIS — M25551 Pain in right hip: Secondary | ICD-10-CM | POA: Diagnosis not present

## 2018-06-23 DIAGNOSIS — M7061 Trochanteric bursitis, right hip: Secondary | ICD-10-CM | POA: Diagnosis not present

## 2018-06-28 DIAGNOSIS — M1611 Unilateral primary osteoarthritis, right hip: Secondary | ICD-10-CM | POA: Diagnosis not present

## 2018-06-28 DIAGNOSIS — R2689 Other abnormalities of gait and mobility: Secondary | ICD-10-CM | POA: Diagnosis not present

## 2018-06-29 DIAGNOSIS — R2689 Other abnormalities of gait and mobility: Secondary | ICD-10-CM | POA: Diagnosis not present

## 2018-06-29 DIAGNOSIS — M1611 Unilateral primary osteoarthritis, right hip: Secondary | ICD-10-CM | POA: Diagnosis not present

## 2018-07-01 MED ORDER — HYDROCORTISONE ACETATE 25 MG RE SUPP: 25.0000 mg | Freq: Every day | RECTAL | 3 refills | Status: DC | PRN

## 2018-07-04 DIAGNOSIS — R2689 Other abnormalities of gait and mobility: Secondary | ICD-10-CM | POA: Diagnosis not present

## 2018-07-04 DIAGNOSIS — M1611 Unilateral primary osteoarthritis, right hip: Secondary | ICD-10-CM | POA: Diagnosis not present

## 2018-07-05 DIAGNOSIS — E230 Hypopituitarism: Secondary | ICD-10-CM | POA: Diagnosis not present

## 2018-07-06 ENCOUNTER — Telehealth: Payer: Self-pay

## 2018-07-06 DIAGNOSIS — R2689 Other abnormalities of gait and mobility: Secondary | ICD-10-CM | POA: Diagnosis not present

## 2018-07-06 DIAGNOSIS — M1611 Unilateral primary osteoarthritis, right hip: Secondary | ICD-10-CM | POA: Diagnosis not present

## 2018-07-06 NOTE — Telephone Encounter (Signed)
Patient inquiring about his suppository medication being sent in. Pharmacy called and was informed that his insurance isn't covered and would cost $90.00. Advised patient that we could send in regular hydrocortisone cream instead. Patient agreed to try this.

## 2018-07-07 MED ORDER — TRIAMCINOLONE ACETONIDE 0.5 % EX CREA: 1.0000 "application " | TOPICAL_CREAM | Freq: Two times a day (BID) | CUTANEOUS | 2 refills | Status: DC

## 2018-07-07 NOTE — Addendum Note (Signed)
Addended by: Vicie Mutters R on: 07/07/2018 05:46 AM   Modules accepted: Orders

## 2018-07-11 DIAGNOSIS — M1611 Unilateral primary osteoarthritis, right hip: Secondary | ICD-10-CM | POA: Diagnosis not present

## 2018-07-11 DIAGNOSIS — R2689 Other abnormalities of gait and mobility: Secondary | ICD-10-CM | POA: Diagnosis not present

## 2018-07-13 ENCOUNTER — Telehealth: Payer: Self-pay

## 2018-07-13 ENCOUNTER — Other Ambulatory Visit: Payer: Self-pay | Admitting: Internal Medicine

## 2018-07-13 NOTE — Telephone Encounter (Signed)
-----   Message from Vicie Mutters, [place] sent at 07/13/2018  1:01 PM EDT ----- Regarding: RE: [place]: [phone] Will refer to GI doctor, can call them [facility]  ----- Message ----- From: Elenor Quinones, CMA Sent: 07/13/2018  10:22 AM EDT To: Vicie Mutters, PA-C Subject: FRONT OFFICE NOTE/MEDS                         PER OFFICE NOTE:            Went to pick up hemorrhoid cream & was told by pharmacy that the cream that was sent into the pharmacy (PER PT) is not for hemorrhoids. ANUSOL is too expensive. Please advise PER PT.

## 2018-07-13 NOTE — Telephone Encounter (Signed)
Pt reports he will get some OTC preparation H & use that until GI DR gets in touch with him. Aware of GI referral

## 2018-07-15 DIAGNOSIS — R2689 Other abnormalities of gait and mobility: Secondary | ICD-10-CM | POA: Diagnosis not present

## 2018-07-15 DIAGNOSIS — M1611 Unilateral primary osteoarthritis, right hip: Secondary | ICD-10-CM | POA: Diagnosis not present

## 2018-07-18 DIAGNOSIS — R2689 Other abnormalities of gait and mobility: Secondary | ICD-10-CM | POA: Diagnosis not present

## 2018-07-18 DIAGNOSIS — M1611 Unilateral primary osteoarthritis, right hip: Secondary | ICD-10-CM | POA: Diagnosis not present

## 2018-07-21 DIAGNOSIS — R2689 Other abnormalities of gait and mobility: Secondary | ICD-10-CM | POA: Diagnosis not present

## 2018-07-21 DIAGNOSIS — M1611 Unilateral primary osteoarthritis, right hip: Secondary | ICD-10-CM | POA: Diagnosis not present

## 2018-07-28 DIAGNOSIS — M25551 Pain in right hip: Secondary | ICD-10-CM | POA: Diagnosis not present

## 2018-08-20 DIAGNOSIS — M25551 Pain in right hip: Secondary | ICD-10-CM | POA: Diagnosis not present

## 2018-08-20 DIAGNOSIS — M7061 Trochanteric bursitis, right hip: Secondary | ICD-10-CM | POA: Diagnosis not present

## 2018-08-25 ENCOUNTER — Ambulatory Visit: Payer: Medicare Other | Admitting: Internal Medicine

## 2018-08-25 DIAGNOSIS — Z0289 Encounter for other administrative examinations: Secondary | ICD-10-CM

## 2018-08-25 NOTE — Progress Notes (Deleted)
Patient ID: Michael Guzman, male   DOB: [birth year], 72 y.o.   MRN: [id number]    HPI  Michael Guzman is a 72 y.o.-year-old male, initially referred by his PCP, Dr. Melford Aase, for evaluation for low TSH and thyroiditis, and now also DM2, dx few years ago, non-insulin-dependent, uncontrolled, with complications (+ PN, CKD).  Last visit 3 months ago. Last OV 2 mo ago.  DM2: Last hemoglobin A1c was: Lab Results  Component Value Date   HGBA1C 10.6 (H) 05/31/2018   HGBA1C 9.4 (H) 02/08/2018   HGBA1C 12.5 (H) 11/04/2017  Before last visit, he had steroid injections for back pain.  He was on: - Metformin ER 1000 mg 2x a day, with meals - Novolin 70/30 40-15-40 units with meals, last dose increased by PCP  He is now on: - Metformin ER 1000 mg 2x a day with meals - Trulicity 1.5 mg weekly-started 11/2017 - NPH and regular insulins: Insulin Before breakfast Before lunch Before dinner  Regular 35 25 35  NPH 40  35  Please inject the insulin 30 min before meals.  Pt checks his sugars 2-3 times a day: - am:  253-410, 440 >> 147, 178-325 - 2h after b'fast: 399-540 >> n/c >> 227, 228 - before lunch: n/c >> 320-390 >> n/c - 2h after lunch: n/c - before dinner: n/c >> 292-579 >> n/c - 2h after dinner: n/c >> 437-505 >> 372 - bedtime: >300-580 >> 490 >> n/c - nighttime: n/c Lowest sugar was 310 >> 272 >> 253 >> ***; it is unclear at which level he has hypoglycemia awareness. Highest sugar was up to 580 >> HI >> 440 >> ***.  Glucometer: One Touch Ultra  He saw nutrition.Pt's meals are: - Breakfast: eggs, bacon, toast - Lunch: smallest meal - Dinner: larger meal  -+ Mild CKD, last BUN/creatinine:  Lab Results  Component Value Date   BUN 18 05/31/2018   BUN 12 02/08/2018   CREATININE 1.09 05/31/2018   CREATININE 1.08 02/08/2018   -+ HL; last set of lipids: Lab Results  Component Value Date   CHOL 104 05/31/2018   HDL 41 05/31/2018   LDLCALC 37 05/31/2018   LDLDIRECT 45.7  10/30/2013   TRIG 180 (H) 05/31/2018   CHOLHDL 2.5 05/31/2018  On Lipitor 20 and omega-3 fatty acids.  - last eye exam was in 04/2018: No DR  - He has numbness and tingling in his feet.  He is on gabapentin 1800 mg daily.  On ASA 81.  Thyroiditis:  He has a long history of low TSH, but TFTs normalized recently:  Reviewed the reports of his previous studies: A thyroid uptake and scan (03/27/2016) returned with uniform scan and suppressed uptake: Slightly decreased 24 hour radio iodine uptake at 8.4%. 09/02/2012  1.  Low normal (11%) 24 hour radioiodine uptake by the thyroid gland. Uptake on the prior studies was 31% and 27%. 2.  Stable scan with fairly homogeneous activity.  No focal hot nodules identified. 04/08/2012: The thyroid scan demonstrates fairly homogeneous uptake in the thyroid gland.  A pyramidal lobe is again demonstrated.  No hot or cold lesions are identified.  Normal uptake in the salivary glands.The 24 hour I-131 uptake was calculated at 26.6%.  Normal is 10-35%. 10/27/2011: The 24 hour radioactive iodine uptake is upper limits of normal at 31.1%.  These findings may represent early Graves' disease or subclinical hyperthyroidism. Radiotracer uptake within the pyramidal lobe is noted. 07/11/2010: 24 are uptake remains within normal range  of 18.9%.  The pertechnetate scan remains normal and unchanged in appearance. 04/26/2009: 24 hour I 131 uptake = 12 % (normal 10-30%). Essentially normal thyroid imaging and 24 prior uptake.  With a slightly depressed TSH, consider subacute thyroiditis or potentially early Grave's disease.  His TFTs normalized in the last 1.5 years: Lab Results  Component Value Date   TSH 0.50 05/31/2018   TSH 0.58 02/08/2018   TSH 0.56 11/04/2017   TSH 0.61 08/04/2017   TSH 0.50 05/03/2017   TSH 0.49 01/15/2017   TSH 0.50 01/01/2017   TSH 0.39 (L) 12/03/2016   TSH 0.41 10/20/2016   TSH 0.44 06/23/2016   FREET4 0.60 05/12/2016   FREET4 1.02  06/26/2012    Pt denies: - feeling nodules in neck - hoarseness - dysphagia - choking - SOB with lying down  Pt was adopted >> unclear if FH of thyroid ds. No FH of thyroid cancer. No h/o radiation tx to head or neck.  No seaweed or kelp. No recent contrast studies. No herbal supplements. No Biotin use. No recent steroids use.   Pt. also has a history of prostate CA, colon CA, HTN, GERD.  ROS: Constitutional: no weight gain/no weight loss, no fatigue, no subjective hyperthermia, no subjective hypothermia Eyes: no blurry vision, no xerophthalmia ENT: no sore throat, + see HPI Cardiovascular: no CP/no SOB/no palpitations/no leg swelling Respiratory: no cough/no SOB/no wheezing Gastrointestinal: no N/no V/no D/no C/no acid reflux Musculoskeletal: no muscle aches/no joint aches Skin: no rashes, no hair loss Neurological: no tremors/+ numbness/+ tingling/no dizziness  I reviewed pt's medications, allergies, PMH, social hx, family hx, and changes were documented in the history of present illness. Otherwise, unchanged from my initial visit note.   Past Medical History:  Diagnosis Date  . Arthritis   . CAD (coronary artery disease)   . Chronic back pain   . Chronically dry eyes   . Diabetes mellitus without complication ([place])   . Elevated PSA    being monitored  . GERD (gastroesophageal reflux disease)   . Hypertension   . Prostate cancer [facility])    Past Surgical History:  Procedure Laterality Date  . APPENDECTOMY    . CARDIAC SURGERY     3 stent placed.  . COLONOSCOPY  02/12/2010   Buccini  . LEFT HEART CATHETERIZATION WITH CORONARY ANGIOGRAM N/A 06/27/2012   Procedure: LEFT HEART CATHETERIZATION WITH CORONARY ANGIOGRAM;  Surgeon: Candee Furbish, MD;  Location: [facility] CATH LAB;  Service: Cardiovascular;  Laterality: N/A;  . LEFT HEART CATHETERIZATION WITH CORONARY ANGIOGRAM N/A 07/25/2012   Procedure: LEFT HEART CATHETERIZATION WITH CORONARY ANGIOGRAM;  Surgeon: Sueanne Margarita,  MD;  Location: [place] CATH LAB;  Service: Cardiovascular;  Laterality: N/A;  . PROSTATE BIOPSY    . RADIOLOGY WITH ANESTHESIA N/A 12/03/2015   Procedure: MRI LUMBAR SPINE;  Surgeon: Medication Radiologist, MD;  Location: [place];  Service: Radiology;  Laterality: N/A;   Social History   Social History  . Marital status: Married    Spouse name: N/A  . Number of children: 4  . Years of education: 2 y colleg   Occupational History  . 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 Smoking status: Former Smoker    Years: 50.00    Quit date: 07/27/2011  . Smokeless tobacco: Never Used  . Alcohol use Yes     Comment: occasional  . Drug use: No   Current Outpatient Medications on File Prior to Visit  Medication Sig  Dispense Refill  . aspirin EC 81 MG tablet Take 81 mg by mouth daily.    Marland Kitchen atorvastatin (LIPITOR) 20 MG tablet TAKE 1 TABLET BY MOUTH  DAILY 90 tablet 3  . Blood Glucose Monitoring Suppl (ONE TOUCH ULTRA SYSTEM KIT) w/Device KIT Check blood sugar 1 time daily-DX-E11.22 1 each 0  . buPROPion (WELLBUTRIN XL) 300 MG 24 hr tablet Take 1 tablet every morning 90 tablet 3  . Cholecalciferol (VITAMIN D3) 5000 UNITS CAPS Take 5,000 Units by mouth 2 (two) times daily.    . Dulaglutide (TRULICITY) 1.5 GY/6.9SW SOPN Inject 1.5 mg into the skin once a week. (Patient not taking: Reported on 05/31/2018) 4 pen 11  . DULoxetine (CYMBALTA) 60 MG capsule Take 1 capsule (60 mg total) by mouth daily. 30 capsule 2  . fluticasone (FLONASE) 50 MCG/ACT nasal spray Place 2 sprays into both nostrils daily. 16 g 0  . furosemide (LASIX) 40 MG tablet Take 1 tablet 2 x/ day as needed  for fluid retention 60 tablet 1  . gabapentin (NEURONTIN) 800 MG tablet TAKE 1 TABLET BY MOUTH 3 TO 4 TIMES DAILY FOR PAIN, HOT FLASHES & SWEATS 360 tablet 1  . glucose blood (ONE TOUCH ULTRA TEST) test strip USE WITH METER TO CHECK  BLOOD SUGAR 2 TIMES DAILY 200 each 3  . hydrocortisone (ANUSOL-HC) 25 MG suppository  Place 1 suppository (25 mg total) rectally daily as needed for hemorrhoids. 12 suppository 3  . insulin NPH Human (HUMULIN N,NOVOLIN N) 100 UNIT/ML injection Inject 40 Units into the skin 2 (two) times daily before a meal.    . insulin regular (NOVOLIN R,HUMULIN R) 100 units/mL injection Inject 35 Units into the skin 2 (two) times daily before a meal.    . Magnesium 250 MG TABS Take 250 mg by mouth daily.    . metFORMIN (GLUCOPHAGE-XR) 500 MG 24 hr tablet TAKE 1 TO 2 TABLETS BY  MOUTH TWO TIMES DAILY AS  DIRECTED FOR DIABETES (Patient taking differently: Take 1,000 mg by mouth 2 (two) times daily. ) 360 tablet 3  . metoprolol succinate (TOPROL-XL) 25 MG 24 hr tablet TAKE ONE-HALF TABLET BY  MOUTH DAILY. TAKE WITH OR  IMMEDIATELY FOLLOWING A  MEAL. (Patient taking differently: Take 12.5 mg by mouth daily. TAKE ONE-HALF TABLET BY  MOUTH DAILY. TAKE WITH OR  IMMEDIATELY FOLLOWING A  MEAL.) 45 tablet 3  . montelukast (SINGULAIR) 10 MG tablet TAKE 1 TABLET BY MOUTH AT  BEDTIME 90 tablet 3  . Multiple Vitamin (MULTIVITAMIN WITH MINERALS) TABS tablet Take 1 tablet by mouth daily.    . nitroGLYCERIN (NITROSTAT) 0.4 MG SL tablet PLACE 1 TABLET UNDER THE TONGUE EVERY 5 MINUTES AS NEEDED FOR CHEST PAIN 25 tablet 6  . oxybutynin (DITROPAN) 5 MG tablet Take 5 mg by mouth daily.  11  . pantoprazole (PROTONIX) 40 MG tablet TAKE 1 TABLET BY MOUTH  DAILY 90 tablet 1  . ranitidine (ZANTAC) 150 MG tablet Take 1 tablet (150 mg total) by mouth 2 (two) times daily. (Patient taking differently: Take 150 mg by mouth every other day. ) 180 tablet 1  . tamsulosin (FLOMAX) 0.4 MG CAPS capsule Take 0.4 mg by mouth at bedtime.    Marland Kitchen tiZANidine (ZANAFLEX) 4 MG tablet Take 1 tablet (4 mg total) by mouth 2 (two) times daily as needed for muscle spasms. 30 tablet 0  . triamcinolone cream (KENALOG) 0.5 % Apply 1 application topically 2 (two) times daily. 80 g 2  No current facility-administered medications on file prior to visit.     No Known Allergies Family History  Problem Relation Age of Onset  . Diabetes Sister    PE: There were no vitals taken for this visit. Wt Readings from Last 3 Encounters:  05/31/18 211 lb (95.7 kg)  02/08/18 214 lb 3.2 oz (97.2 kg)  02/02/18 212 lb 9.6 oz (96.4 kg)   Constitutional: overweight, in NAD Eyes: PERRLA, EOMI, no exophthalmos ENT: moist mucous membranes, no thyromegaly, no cervical lymphadenopathy Cardiovascular: RRR, No MRG Respiratory: CTA B Gastrointestinal: abdomen soft, NT, ND, BS+ Musculoskeletal: no deformities, strength intact in all 4 Skin: moist, warm, no rashes Neurological: no tremor with outstretched hands, DTR normal in all 4  ASSESSMENT: 1. Low TSH 2/2 Thyroiditis  2. DM2  3. HL  PLAN:  1. Patient with a history of low TSH secondary to thyroiditis, diagnosed in 2010.  He has had fluctuating TFTs since then, however, TFTs normalized recently, including at last check, in 05/2018. -He had several thyroid uptake and scan tests over the years with divers results: Normal, Graves' disease, thyroiditis (?).  Latest uptake and scan showed decreased thyroid uptake and homogeneous scan, but this was performed 2 months after a CT with contrast which could have influence the results. -He denies thyrotoxic symptoms.  2. DM2  - patient with history of very uncontrolled diabetes, with higher sugars at last visit and an HbA1c increased to 10.6%, from 9.4%.  His sugars also improved due to adding Trulicity.  At last visit, I advised him to increase to the target dose of 1.5 mg weekly.  At that time, he was only checking sugars in the morning and I strongly advised him to start checking later in the day. -He contacted me in 03/2018 that he could not get Trulicity due to cost.  We advised him to call his insurance and see which GLP-1 receptor agonist is covered instead.  - I suggested to:  Patient Instructions   Please continue: - Metformin ER 1000 mg 2x a day with  meals - Trulicity 1.5 mg weekly - NPH and regular insulins: Insulin Before breakfast Before lunch Before dinner  Regular 35 25 35  NPH 40  35  Please inject the insulin 30 min before meals.  Please return in 3 months with your sugar log.   - today, HbA1c is 7%  - continue checking sugars at different times of the day - check 3-4x a day, rotating checks - advised for yearly eye exams >> he is UTD - Return to clinic in 3 mo with sugar log   3. HL - Reviewed latest lipid panel from 05/2018: Excellent LDL, normal HDL, slightly high triglycerides Lab Results  Component Value Date   CHOL 104 05/31/2018   HDL 41 05/31/2018   LDLCALC 37 05/31/2018   LDLDIRECT 45.7 10/30/2013   TRIG 180 (H) 05/31/2018   CHOLHDL 2.5 05/31/2018  - Continues the Lipitor and omega-3 fatty acids without side effects.   Philemon Kingdom, MD PhD [facility]

## 2018-08-30 ENCOUNTER — Ambulatory Visit (INDEPENDENT_AMBULATORY_CARE_PROVIDER_SITE_OTHER): Payer: Medicare Other | Admitting: Internal Medicine

## 2018-08-30 ENCOUNTER — Encounter: Payer: Self-pay | Admitting: Internal Medicine

## 2018-08-30 VITALS — BP 122/66 | HR 84 | Temp 97.5°F | Resp 18 | Ht 69.0 in | Wt 220.6 lb

## 2018-08-30 DIAGNOSIS — E1142 Type 2 diabetes mellitus with diabetic polyneuropathy: Secondary | ICD-10-CM | POA: Diagnosis not present

## 2018-08-30 DIAGNOSIS — I10 Essential (primary) hypertension: Secondary | ICD-10-CM

## 2018-08-30 DIAGNOSIS — E1122 Type 2 diabetes mellitus with diabetic chronic kidney disease: Secondary | ICD-10-CM

## 2018-08-30 DIAGNOSIS — Z79899 Other long term (current) drug therapy: Secondary | ICD-10-CM

## 2018-08-30 DIAGNOSIS — Z794 Long term (current) use of insulin: Secondary | ICD-10-CM

## 2018-08-30 DIAGNOSIS — E782 Mixed hyperlipidemia: Secondary | ICD-10-CM

## 2018-08-30 DIAGNOSIS — K219 Gastro-esophageal reflux disease without esophagitis: Secondary | ICD-10-CM

## 2018-08-30 DIAGNOSIS — E559 Vitamin D deficiency, unspecified: Secondary | ICD-10-CM

## 2018-08-30 DIAGNOSIS — N182 Chronic kidney disease, stage 2 (mild): Secondary | ICD-10-CM

## 2018-08-30 NOTE — Progress Notes (Signed)
This very nice 72 y.o. MBM presents for 6 month follow up with HTN, HLD, Insulin  Requiring T2_DM and Vitamin D Deficiency. Patient has hx/o Depression and has a multitude of aches & pains.  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  Patient has hx/o Prostate Cancer  Aug 2017 treated with external Radiotherapy and has also been on Lupron injections.      Patient is treated for HTN (1991)  & BP has been controlled at home. Today's BP is at goal - 122/66.  Patient has ASCAD and in 2013 he had PCA/Stents & has done well since. Patient has had no complaints of any cardiac type chest pain, palpitations, dyspnea / orthopnea / PND, dizziness, claudication, or dependent edema.     Hyperlipidemia is controlled with diet & meds. Patient denies myalgias or other med SE's. Last Lipids were at goal albeit slightly elevated Trig's: Lab Results  Component Value Date   CHOL 104 05/31/2018   HDL 41 05/31/2018   LDLCALC 37 05/31/2018   LDLDIRECT 45.7 10/30/2013   TRIG 180 (H) 05/31/2018   CHOLHDL 2.5 05/31/2018      Also, the patient has history of T2_NIDDM (1998). Compliance from Michael Guzman has been a long standing problem with A1c's in poor control. In May 2016 he was started on Insulin and has been followed by Dr Michael Guzman and has had no symptoms of reactive hypoglycemia, diabetic polys, paresthesias or visual blurring. As well as frequently forgetting his prudent dietary restrictions, he admits that he occasionally forgets his insulin and has difficulty with a tid sliding scale.  Last A1c was not at goal. Lab Results  Component Value Date   HGBA1C 10.6 (H) 05/31/2018      Further, the patient also has history of Vitamin D Deficiency ("15" / 2008) and supplements vitamin D without any suspected side-effects. Last vitamin D was at goal: Lab Results  Component Value Date   VD25OH 86 02/08/2018   Current Outpatient Medications on File Prior to Visit  Medication Sig    . aspirin EC 81 MG tablet Take 81 mg by mouth daily.  Marland Kitchen atorvastatin (LIPITOR) 20 MG tablet TAKE 1 TABLET BY MOUTH  DAILY  . Blood Glucose Monitoring Suppl (ONE TOUCH ULTRA SYSTEM KIT) w/Device KIT Check blood sugar 1 time daily-DX-E11.22  . buPROPion (WELLBUTRIN XL) 300 MG 24 hr tablet Take 1 tablet every morning  . VITAMIN D 5000 UNITS CAPS Take 5,000 Units by mouth 2 (two) times daily.  . DULoxetine 60 MG capsule Take 1 capsule (60 mg total) by mouth daily.  Marland Kitchen FLONASE nasal spray Place 2 sprays into both nostrils daily.  . furosemide (LASIX) 40 MG Take 1 tablet 2 x/ day as needed  for fluid retention  . gabapentin 800 MG tablet TAKE 1 TABLET  3 TO 4 x / DAILY   . ANUSOL-HC 25 MG supp Place 1 suppository rectally daily as needed  . NOVOLIN N 100 UNIT/ML  Inject 40 Units into the skin 2  times daily before a meal.  . NOVOLIN R 100 units/mL  Inject 35 Units into the skin 2  times daily before a meal.  . Magnesium 250 MG TABS Take 250 mg by mouth daily.  . metFORMIN-XR 500 MG  TAKE 1 TO 2 TABLETS  TWO TIMES DAILY  . metoprolol succ-XL 25 MG  TTake 12.5 mg by mouth daily.   . montelukast ( 10 MG tablet TAKE  1 TABLET BY MOUTH AT  BEDTIME  . Multi-Vit w/Min Take 1 tablet by mouth daily.  . NITROSTAT 0.4 MG SL  AS NEEDED FOR CHEST PAIN  . oxybutynin  5 MG tablet Take 5 mg by mouth daily.  .  MG tablet TAKE 1 TABLET BY MOUTH  DAILY  . tamsulosin  0.4 MG CAPS capsule Take 0.4 mg by mouth at bedtime.  . tiZANidine  4 MG tablet Take 1 tablet  2times daily as needed   . triamcinolone cream 0.5 % Apply 1 application topically 2 (two) times daily.  . TRULICITY 1.5 MG/0.5ML  Patient not taking: Reported on 05/31/2018)   No Known Allergies PMHx:   Past Medical History:  Diagnosis Date  . Arthritis   . CAD (coronary artery disease)   . Chronic back pain   . Chronically dry eyes   . Diabetes mellitus without complication (HCC)   . Elevated PSA    being monitored  . GERD (gastroesophageal reflux  disease)   . Hypertension   . Prostate cancer (HCC)    Immunization History  Administered Date(s) Administered  . Influenza Split 06/26/2012  . Influenza, High Dose Seasonal PF 06/20/2015, 06/23/2016, 06/14/2017  . Influenza-Unspecified 06/14/2013, 06/25/2014, 06/03/2018  . Pneumococcal Conjugate-13 04/23/2016  . Pneumococcal Polysaccharide-23 06/14/2017  . Pneumococcal-Unspecified 06/16/2010  . Td 09/17/2003  . Tdap 11/26/2013  . Zoster 07/12/2012   Past Surgical History:  Procedure Laterality Date  . APPENDECTOMY    . CARDIAC SURGERY     3 stent placed.  . COLONOSCOPY  02/12/2010   Buccini  . LEFT HEART CATHETERIZATION WITH CORONARY ANGIOGRAM N/A 06/27/2012   Procedure: LEFT HEART CATHETERIZATION WITH CORONARY ANGIOGRAM;  Surgeon: Mark Skains, MD;  Location: [facility] CATH LAB;  Service: Cardiovascular;  Laterality: N/A;  . LEFT HEART CATHETERIZATION WITH CORONARY ANGIOGRAM N/A 07/25/2012   Procedure: LEFT HEART CATHETERIZATION WITH CORONARY ANGIOGRAM;  Surgeon: Traci R Turner, MD;  Location: [facility] CATH LAB;  Service: Cardiovascular;  Laterality: N/A;  . PROSTATE BIOPSY    . RADIOLOGY WITH ANESTHESIA N/A 12/03/2015   Procedure: MRI LUMBAR SPINE;  Surgeon: Medication Radiologist, MD;  Location: [facility] OR;  Service: Radiology;  Laterality: N/A;   FHx:    Reviewed / unchanged  SHx:    Reviewed / unchanged   Systems Review:  Constitutional: Denies fever, chills, wt changes, headaches, insomnia, fatigue, night sweats, change in appetite. Eyes: Denies redness, blurred vision, diplopia, discharge, itchy, watery eyes.  ENT: Denies discharge, congestion, post nasal drip, epistaxis, sore throat, earache, hearing loss, dental pain, tinnitus, vertigo, sinus pain, snoring.  CV: Denies chest pain, palpitations, irregular heartbeat, syncope, dyspnea, diaphoresis, orthopnea, PND, claudication or edema. Respiratory: denies cough, dyspnea, DOE, pleurisy, hoarseness, laryngitis, wheezing.    Gastrointestinal: Denies dysphagia, odynophagia, heartburn, reflux, water brash, abdominal pain or cramps, nausea, vomiting, bloating, diarrhea, constipation, hematemesis, melena, hematochezia  or hemorrhoids. Genitourinary: Denies dysuria, frequency, urgency, nocturia, hesitancy, discharge, hematuria or flank pain. Musculoskeletal: Denies arthralgias, myalgias, stiffness, jt. swelling, pain, limping or strain/sprain.  Skin: Denies pruritus, rash, hives, warts, acne, eczema or change in skin lesion(s). Neuro: No weakness, tremor, incoordination, spasms, paresthesia or pain. Psychiatric: Denies confusion, memory loss or sensory loss. Endo: Denies change in weight, skin or hair change.  Heme/Lymph: No excessive bleeding, bruising or enlarged lymph nodes.  Physical Exam  BP 122/66   Pulse 84   Temp (!) 97.5 F (36.4 C)   Resp 18   Ht 5' 9" (1.753 m)   Wt 220   lb 9.6 oz (100.1 kg)   BMI 32.58 kg/m   Appears  well nourished, well groomed  and in no distress.  Eyes: PERRLA, EOMs, conjunctiva no swelling or erythema. Sinuses: No frontal/maxillary tenderness ENT/Mouth: EAC's clear, TM's nl w/o erythema, bulging. Nares clear w/o erythema, swelling, exudates. Oropharynx clear without erythema or exudates. Oral hygiene is good. Tongue normal, non obstructing. Hearing intact.  Neck: Supple. Thyroid not palpable. Car 2+/2+ without bruits, nodes or JVD. Chest: Respirations nl with BS clear & equal w/o rales, rhonchi, wheezing or stridor.  Cor: Heart sounds normal w/ regular rate and rhythm without sig. murmurs, gallops, clicks or rubs. Peripheral pulses normal and equal  without edema.  Abdomen: Soft & bowel sounds normal. Non-tender w/o guarding, rebound, hernias, masses or organomegaly.  Lymphatics: Unremarkable.  Musculoskeletal: Full ROM all peripheral extremities, joint stability, 5/5 strength and normal gait.  Skin: Warm, dry without exposed rashes, lesions or ecchymosis apparent.  Neuro:  Cranial nerves intact, reflexes equal bilaterally. Sensory-motor testing grossly intact. Tendon reflexes grossly intact.  Pysch: Alert & oriented x 3.  Insight and judgement nl & appropriate. No ideations.  Assessment and Plan:  1. Essential hypertension  - Continue medication, monitor blood pressure at home.  - Continue DASH diet.  Reminder to go to the ER if any CP,  SOB, nausea, dizziness, severe HA, changes vision/speech.  - CBC with Differential/Platelet - COMPLETE METABOLIC PANEL WITH GFR - Magnesium - TSH  2. Hyperlipidemia, mixed  - Continue diet/meds, exercise,& lifestyle modifications.  - Continue monitor periodic cholesterol/liver & renal functions   - Lipid panel - TSH  3. Type 2 diabetes mellitus with stage 2 chronic kidney disease, with long-term current use of insulin (HCC)  - To simplify his regimen so he can comprehend and hopefully accommodate, he's advised to switch Insulins to Novolin /Relion 70/30 and begin with 40 units qam & 35 u qpm.  - It's suspected that he is Insulin Resistant and may need a basal Insulin at moderate dose to control his sugars.    - Continue diet, exercise,  - lifestyle modifications.  - Monitor appropriate labs.  - Hemoglobin A1c - Insulin, random  4. Vitamin D deficiency  - Continue supplementation.   - VITAMIN D 25 Hydroxyl  5. Diabetic peripheral neuropathy (HCC)  - Hemoglobin A1c - Insulin, random  6. Gastroesophageal reflux disease without esophagitis  - CBC with Differential/Platelet  7. Medication management  - CBC with Differential/Platelet - COMPLETE METABOLIC PANEL WITH GFR - Magnesium - Lipid panel - TSH - Hemoglobin A1c - Insulin, random - VITAMIN D 25 Hydroxyl       Discussed  regular exercise, BP monitoring, weight control to achieve/maintain BMI less than 25 and discussed med and SE's. Recommended labs to assess and monitor clinical status with further disposition pending results of labs. Over 30  minutes of exam, counseling, chart review was performed.  

## 2018-08-30 NOTE — Patient Instructions (Signed)

## 2018-08-31 LAB — CBC WITH DIFFERENTIAL/PLATELET
Absolute Monocytes: 561 cells/uL (ref 200–950)
BASOS ABS: 28 {cells}/uL (ref 0–200)
Basophils Relative: 0.5 %
EOS ABS: 99 {cells}/uL (ref 15–500)
Eosinophils Relative: 1.8 %
HEMATOCRIT: 39.8 % (ref 38.5–50.0)
HEMOGLOBIN: 13.1 g/dL — AB (ref 13.2–17.1)
Lymphs Abs: 715 cells/uL — ABNORMAL LOW (ref 850–3900)
MCH: 30 pg (ref 27.0–33.0)
MCHC: 32.9 g/dL (ref 32.0–36.0)
MCV: 91.3 fL (ref 80.0–100.0)
MONOS PCT: 10.2 %
MPV: 11.9 fL (ref 7.5–12.5)
NEUTROS ABS: 4098 {cells}/uL (ref 1500–7800)
NEUTROS PCT: 74.5 %
Platelets: 184 10*3/uL (ref 140–400)
RBC: 4.36 10*6/uL (ref 4.20–5.80)
RDW: 12.7 % (ref 11.0–15.0)
Total Lymphocyte: 13 %
WBC: 5.5 10*3/uL (ref 3.8–10.8)

## 2018-08-31 LAB — LIPID PANEL
Cholesterol: 117 mg/dL (ref <200)
HDL: 42 mg/dL (ref >40)
LDL Cholesterol (Calc): 47 mg/dL (calc)
NON-HDL CHOLESTEROL (CALC): 75 mg/dL (ref <130)
Total CHOL/HDL Ratio: 2.8 (calc) (ref <5.0)
Triglycerides: 222 mg/dL — ABNORMAL HIGH (ref <150)

## 2018-08-31 LAB — COMPLETE METABOLIC PANEL WITH GFR
AG Ratio: 2.1 (calc) (ref 1.0–2.5)
ALKALINE PHOSPHATASE (APISO): 87 U/L (ref 40–115)
ALT: 38 U/L (ref 9–46)
AST: 27 U/L (ref 10–35)
Albumin: 4.5 g/dL (ref 3.6–5.1)
BUN: 13 mg/dL (ref 7–25)
CALCIUM: 10.1 mg/dL (ref 8.6–10.3)
CO2: 23 mmol/L (ref 20–32)
CREATININE: 1.06 mg/dL (ref 0.70–1.18)
Chloride: 104 mmol/L (ref 98–110)
GFR, EST NON AFRICAN AMERICAN: 70 mL/min/{1.73_m2} (ref >=60)
GFR, Est African American: 81 mL/min/{1.73_m2} (ref >=60)
Globulin: 2.1 g/dL (calc) (ref 1.9–3.7)
Glucose, Bld: 378 mg/dL — ABNORMAL HIGH (ref 65–99)
Potassium: 4.2 mmol/L (ref 3.5–5.3)
Sodium: 137 mmol/L (ref 135–146)
Total Bilirubin: 0.5 mg/dL (ref 0.2–1.2)
Total Protein: 6.6 g/dL (ref 6.1–8.1)

## 2018-08-31 LAB — HEMOGLOBIN A1C
Hgb A1c MFr Bld: 11.6 % of total Hgb — ABNORMAL HIGH (ref <5.7)
Mean Plasma Glucose: 286 (calc)
eAG (mmol/L): 15.9 (calc)

## 2018-08-31 LAB — VITAMIN D 25 HYDROXY (VIT D DEFICIENCY, FRACTURES): Vit D, 25-Hydroxy: 76 ng/mL (ref 30–100)

## 2018-08-31 LAB — TSH: TSH: 0.61 m[IU]/L (ref 0.40–4.50)

## 2018-08-31 LAB — MAGNESIUM: MAGNESIUM: 1.9 mg/dL (ref 1.5–2.5)

## 2018-08-31 LAB — INSULIN, RANDOM: Insulin: 38.6 u[IU]/mL — ABNORMAL HIGH (ref 2.0–19.6)

## 2018-09-04 ENCOUNTER — Encounter: Payer: Self-pay | Admitting: Internal Medicine

## 2018-09-26 DIAGNOSIS — M25551 Pain in right hip: Secondary | ICD-10-CM | POA: Diagnosis not present

## 2018-10-11 ENCOUNTER — Telehealth: Payer: Self-pay | Admitting: *Deleted

## 2018-10-11 NOTE — Telephone Encounter (Signed)
   [facility] Pre-operative Risk Assessment    Request for surgical clearance:  1. What type of surgery is being performed? RIGHT TOTAL HIP ARTHROPLASTY   2. When is this surgery scheduled? TBD   3. What type of clearance is required (medical clearance vs. Pharmacy clearance to hold med vs. Both)? MEDICAL  4. Are there any medications that need to be held prior to surgery and how long?ASA   5. Practice name and name of physician performing surgery? [facility]; DR. Jenny Reichmann LEE GRAVES   6. What is your office phone number [phone]    7.   What is your office fax number [phone]  8.   Anesthesia type (None, local, MAC, general) ? SPINAL   Julaine Hua 10/11/2018, 3:27 PM  _________________________________________________________________   (provider comments below)

## 2018-10-17 NOTE — Telephone Encounter (Signed)
Follow up    Michael Guzman from [place] calling to get updates on clearance

## 2018-10-19 NOTE — Telephone Encounter (Signed)
   Primary Cardiologist:Mark Marlou Porch, MD  Chart reviewed as part of pre-operative protocol coverage. Because of Michael Guzman's past medical history and time since last visit, he/she will require a follow-up visit in order to better assess preoperative cardiovascular risk.  Pre-op covering staff: - Please schedule appointment and call patient to inform them. - Please contact requesting surgeon's office via preferred method (i.e, phone, fax) to inform them of need for appointment prior to surgery.  If applicable, this message will also be routed to pharmacy pool and/or primary cardiologist for input on holding anticoagulant/antiplatelet agent as requested below so that this information is available at time of patient's appointment.   Cecilie Kicks, NP  10/19/2018, 2:22 PM

## 2018-10-19 NOTE — Telephone Encounter (Signed)
Pt has been scheduled to see Melina Copa, PA-C, 10/26/18.

## 2018-10-25 ENCOUNTER — Encounter: Payer: Self-pay | Admitting: Physician Assistant

## 2018-10-25 DIAGNOSIS — H40013 Open angle with borderline findings, low risk, bilateral: Secondary | ICD-10-CM | POA: Diagnosis not present

## 2018-10-25 DIAGNOSIS — E781 Pure hyperglyceridemia: Secondary | ICD-10-CM | POA: Insufficient documentation

## 2018-10-25 DIAGNOSIS — H2513 Age-related nuclear cataract, bilateral: Secondary | ICD-10-CM | POA: Diagnosis not present

## 2018-10-25 NOTE — Progress Notes (Addendum)
Cardiology Office Note    Date:  10/26/2018  ID:  Michael Guzman, Michael Guzman 07/08/1946, MRN [id number] PCP:  [name], MD  Cardiologist:  Candee Furbish, MD   Chief Complaint: pre-op cardiovascular evaluation  History of Present Illness:  Michael Guzman is a 73 y.o. male with history of CAD s/p DES to mid LAD and OM2 06/2012, IDDM, prostate CA, GERD, HTN, hypertriglyceridemia, arthritis, obesity who presents for pre-op eval for hip surgery with [facility], Dr. Berenice Primas.  His last PCI was in 2013. He underwent relook cath 07/2012 which showed patent stents, 50% prox PDA stenosis, otherwise normal, LVEF 60%. Nuclear stress test 04/2016 showed Medium size, mild intensity mostly fixed (SDS 2) septal attenuation artifact, no significant reversible ischemia, LVEF 63% with normal wall motion, low risk study. Last echo 01/2018 showed EF 60-65%, grade 1 DD, mild MR, mild TR. Last labs 08/2018 with Hgb 13.1, K 4.2, Cr 1.06, LFTs wnl, Mg 1.9, LDL 47, TSH wnl, A1C 11.6.  He returns for pre-op CV examination. He has been struggling with right hip arthritis which is really limiting his mobility. He has tried prednisone with limited relief for only a brief period of time. He denies any changes in his cardiac status including CP, SOB, palpitations, orthopnea, dyspnea, syncope. Unfortunately with his hip pain is not really able to exert himself quickly or truly exceed 4 METS. He periodically uses a seated pedaler, but has even slowed down with this due to hip pain. He is accompanied by wife today. He has [place] few grapevines on his property.   Past Medical History:  Diagnosis Date  . Arthritis   . CAD (coronary artery disease)    a.s/p DES to mid LAD and OM2 06/2012.  Marland Kitchen Chronic back pain   . Chronically dry eyes   . Diabetes mellitus without complication ([place])   . Elevated PSA    being monitored  . GERD (gastroesophageal reflux disease)   . Hypertension   . Hypertriglyceridemia   . Prostate cancer  [facility])     Past Surgical History:  Procedure Laterality Date  . APPENDECTOMY    . CARDIAC SURGERY     3 stent placed.  . COLONOSCOPY  02/12/2010   Buccini  . LEFT HEART CATHETERIZATION WITH CORONARY ANGIOGRAM N/A 06/27/2012   Procedure: LEFT HEART CATHETERIZATION WITH CORONARY ANGIOGRAM;  Surgeon: Candee Furbish, MD;  Location: [facility] CATH LAB;  Service: Cardiovascular;  Laterality: N/A;  . LEFT HEART CATHETERIZATION WITH CORONARY ANGIOGRAM N/A 07/25/2012   Procedure: LEFT HEART CATHETERIZATION WITH CORONARY ANGIOGRAM;  Surgeon: Sueanne Margarita, MD;  Location: [place] CATH LAB;  Service: Cardiovascular;  Laterality: N/A;  . PROSTATE BIOPSY    . RADIOLOGY WITH ANESTHESIA N/A 12/03/2015   Procedure: MRI LUMBAR SPINE;  Surgeon: Medication Radiologist, MD;  Location: [place];  Service: Radiology;  Laterality: N/A;    Current Medications: Current Meds  Medication Sig  . aspirin EC 81 MG tablet Take 81 mg by mouth daily.  Marland Kitchen atorvastatin (LIPITOR) 20 MG tablet TAKE 1 TABLET BY MOUTH  DAILY  . Blood Glucose Monitoring Suppl (ONE TOUCH ULTRA SYSTEM KIT) w/Device KIT Check blood sugar 1 time daily-[id number]  . buPROPion (WELLBUTRIN XL) 300 MG 24 hr tablet Take 1 tablet every morning  . Cholecalciferol (VITAMIN D3) 5000 UNITS CAPS Take 5,000 Units by mouth 2 (two) times daily.  . Dulaglutide (TRULICITY) 1.5 CV/8.9FY SOPN Inject 1.5 mg into the skin once a week.  . DULoxetine (CYMBALTA) 60 MG capsule  Take 1 capsule (60 mg total) by mouth daily.  . fluticasone (FLONASE) 50 MCG/ACT nasal spray Place 2 sprays into both nostrils daily.  . furosemide (LASIX) 40 MG tablet Take 1 tablet 2 x/ day as needed  for fluid retention  . gabapentin (NEURONTIN) 800 MG tablet TAKE 1 TABLET BY MOUTH 3 TO 4 TIMES DAILY FOR PAIN, HOT FLASHES & SWEATS  . glucose blood (ONE TOUCH ULTRA TEST) test strip USE WITH METER TO CHECK  BLOOD SUGAR 2 TIMES DAILY  . hydrocortisone (ANUSOL-HC) 25 MG suppository Place 1 suppository (25 mg total)  rectally daily as needed for hemorrhoids.  . insulin NPH Human (HUMULIN N,NOVOLIN N) 100 UNIT/ML injection Inject 40 Units into the skin 2 (two) times daily before a meal.  . insulin regular (NOVOLIN R,HUMULIN R) 100 units/mL injection Inject 35 Units into the skin 2 (two) times daily before a meal.  . Magnesium 250 MG TABS Take 250 mg by mouth daily.  . metFORMIN (GLUCOPHAGE-XR) 500 MG 24 hr tablet TAKE 1 TO 2 TABLETS BY  MOUTH TWO TIMES DAILY AS  DIRECTED FOR DIABETES  . metoprolol succinate (TOPROL-XL) 25 MG 24 hr tablet TAKE ONE-HALF TABLET BY  MOUTH DAILY. TAKE WITH OR  IMMEDIATELY FOLLOWING A  MEAL.  . montelukast (SINGULAIR) 10 MG tablet TAKE 1 TABLET BY MOUTH AT  BEDTIME  . Multiple Vitamin (MULTIVITAMIN WITH MINERALS) TABS tablet Take 1 tablet by mouth daily.  . nitroGLYCERIN (NITROSTAT) 0.4 MG SL tablet PLACE 1 TABLET UNDER THE TONGUE EVERY 5 MINUTES AS NEEDED FOR CHEST PAIN  . oxybutynin (DITROPAN) 5 MG tablet Take 5 mg by mouth daily.  . pantoprazole (PROTONIX) 40 MG tablet TAKE 1 TABLET BY MOUTH  DAILY  . tamsulosin (FLOMAX) 0.4 MG CAPS capsule Take 0.4 mg by mouth at bedtime.  Marland Kitchen tiZANidine (ZANAFLEX) 4 MG tablet Take 1 tablet (4 mg total) by mouth 2 (two) times daily as needed for muscle spasms.  Marland Kitchen triamcinolone cream (KENALOG) 0.5 % Apply 1 application topically 2 (two) times daily.    Allergies:   Patient has no known allergies.   Social History   Socioeconomic History  . Marital status: Married    Spouse name: Not on file  . Number of children: 4  . Years of education: 2 y colleg  . Highest education level: Not on file  Occupational History  . Occupation: SUPERVISOR    Employer: [employer]  . Financial resource strain: Not on file  . Food insecurity:    Worry: Not on file    Inability: Not on file  . Transportation needs:    Medical: Not on file    Non-medical: Not on file  Tobacco Use  . Smoking status: Former Smoker    Years: 50.00     Last attempt to quit: 07/27/2011    Years since quitting: 7.2  . Smokeless tobacco: Never Used  Substance and Sexual Activity  . Alcohol use: Yes    Comment: occasional  . Drug use: No  . Sexual activity: Not on file  Lifestyle  . Physical activity:    Days per week: Not on file    Minutes per session: Not on file  . Stress: Not on file  Relationships  . Social connections:    Talks on phone: Not on file    Gets together: Not on file    Attends religious service: Not on file    Active member of club or organization: Not  on file    Attends meetings of clubs or organizations: Not on file    Relationship status: Not on file  Other Topics Concern  . Not on file  Social History Narrative   Works as      Family History:  The patient's family history includes Diabetes in his sister.  ROS:   Please see the history of present illness.  All other systems are reviewed and otherwise negative.    PHYSICAL EXAM:   VS:  BP 132/84   Pulse 92   Ht _0  (1.753 m)   Wt 230 lb 12.8 oz (104.7 kg)   SpO2 96%   BMI 34.08 kg/m   BMI: Body mass index is 34.08 kg/m. GEN: Well nourished, well developed obese AAM, in no acute distress HEENT: normocephalic, atraumatic Neck: no JVD, carotid bruits, or masses Cardiac: RRR; no murmurs, rubs, or gallops, no edema. Excellent pedal pulses Respiratory:  clear to auscultation bilaterally, normal work of breathing GI: soft, nontender, nondistended, + BS MS: no deformity or atrophy Skin: warm and dry, no rash Neuro:  Alert and Oriented x 3, Strength and sensation are intact, follows commands Psych: euthymic mood, full affect  Wt Readings from Last 3 Encounters:  10/26/18 230 lb 12.8 oz (104.7 kg)  08/30/18 220 lb 9.6 oz (100.1 kg)  05/31/18 211 lb (95.7 kg)      Studies/Labs Reviewed:   EKG:  EKG was ordered today and personally reviewed by me and demonstrates NSR 92bpm no acute changes.  Recent Labs: 01/31/2018: B Natriuretic Peptide  8.2 08/30/2018: ALT 38; BUN 13; Creat 1.06; Hemoglobin 13.1; Magnesium 1.9; Platelets 184; Potassium 4.2; Sodium 137; TSH 0.61   Lipid Panel    Component Value Date/Time   CHOL 117 08/30/2018 1028   TRIG 222 (H) 08/30/2018 1028   HDL 42 08/30/2018 1028   CHOLHDL 2.8 08/30/2018 1028   VLDL 59 (H) 01/15/2017 1118   LDLCALC 47 08/30/2018 1028   LDLDIRECT 45.7 10/30/2013 0743    Additional studies/ records that were reviewed today include: Summarized above.  ASSESSMENT & PLAN:   1. Pre-op cardiovascular evaluation with h/o CAD - revised cardiac risk index is 6.6% indicating moderate risk of CV complications. Unfortunately due to his hip pain, he is unable to truly achieve >4 METS to exclude ischemic symptoms. Will plan Lexiscan nuclear stress test to risk stratify. If low risk, should be able to proceed without further testing. I have already reviewed with Dr. Marlou Porch who felt when cleared, he may hold ASA for 5-7 days if requested. I will plan to bundle recs to surgeon once we make a final clearance decision. (I will send this note upon closing as well.) Distal pulses are good and he is not having any traditional claudication symptoms to suggest alternative pathology. Regarding CAD otherwise he is asymptomatic. Continue ASA, BB, statin. Dr. Marlou Porch had previously recommended to decrease atorvastatin to 15m which he has been at for several years. LDL was at goal 08/2018. 2. Essential HTN - BP controlled. 3. Hypertriglyceridemia - likely due to IDDM. We discussed importance of controlling his DM. Unfortunately he's stopped paying attention to what he eats and now eats whatever he wants.  4. IDDM - as above, discussed importance of working with PCP to control.  Disposition: F/u with Dr. SMarlou Porchin 1 year.  Medication Adjustments/Labs and Tests Ordered: Current medicines are reviewed at length with the patient today.  Concerns regarding medicines are outlined above. Medication changes, Labs and  Tests  ordered today are summarized above and listed in the Patient Instructions accessible in Encounters.   Signed, Charlie Pitter, PA-C  10/26/2018 2:22 PM    [facility] [address], [place], [place]  [address] Phone: [phone]; Fax: [phone]

## 2018-10-26 ENCOUNTER — Encounter: Payer: Self-pay | Admitting: Physician Assistant

## 2018-10-26 ENCOUNTER — Ambulatory Visit: Payer: Medicare Other | Admitting: Physician Assistant

## 2018-10-26 ENCOUNTER — Encounter: Payer: Self-pay | Admitting: *Deleted

## 2018-10-26 VITALS — BP 132/84 | HR 92 | Ht 69.0 in | Wt 230.8 lb

## 2018-10-26 DIAGNOSIS — Z794 Long term (current) use of insulin: Secondary | ICD-10-CM

## 2018-10-26 DIAGNOSIS — Z01810 Encounter for preprocedural cardiovascular examination: Secondary | ICD-10-CM | POA: Diagnosis not present

## 2018-10-26 DIAGNOSIS — I10 Essential (primary) hypertension: Secondary | ICD-10-CM | POA: Diagnosis not present

## 2018-10-26 DIAGNOSIS — I359 Nonrheumatic aortic valve disorder, unspecified: Secondary | ICD-10-CM

## 2018-10-26 DIAGNOSIS — E781 Pure hyperglyceridemia: Secondary | ICD-10-CM

## 2018-10-26 DIAGNOSIS — I2510 Atherosclerotic heart disease of native coronary artery without angina pectoris: Secondary | ICD-10-CM

## 2018-10-26 DIAGNOSIS — E119 Type 2 diabetes mellitus without complications: Secondary | ICD-10-CM | POA: Diagnosis not present

## 2018-10-26 DIAGNOSIS — IMO0001 Reserved for inherently not codable concepts without codable children: Secondary | ICD-10-CM

## 2018-10-26 NOTE — Patient Instructions (Addendum)
Medication Instructions:  Your physician recommends that you continue on your current medications as directed. Please refer to the Current Medication list given to you today.  If you need a refill on your cardiac medications before your next appointment, please call your pharmacy.   Lab work: None ordered  If you have labs (blood work) drawn today and your tests are completely normal, you will receive your results only by: Marland Kitchen MyChart Message (if you have MyChart) OR . A paper copy in the mail If you have any lab test that is abnormal or we need to change your treatment, we will call you to review the results.  Testing/Procedures: Your physician has requested that you have a lexiscan myoview. For further information please visit [email or web address]. Please follow instruction sheet, as given.    Follow-Up: At [facility], you and your health needs are our priority.  As part of our continuing mission to provide you with exceptional heart care, we have created designated Provider Care Teams.  These Care Teams include your primary Cardiologist (physician) and Advanced Practice Providers (APPs -  Physician Assistants and Nurse Practitioners) who all work together to provide you with the care you need, when you need it. You will need a follow up appointment in 12 months.  Please call our office 2 months in advance to schedule this appointment.  You may see Candee Furbish, MD or one of the following Advanced Practice Providers on your designated Care Team:   Truitt Merle, NP Cecilie Kicks, NP . Kathyrn Drown, NP  Any Other Special Instructions Will Be Listed Below (If Applicable).   Cardiac Nuclear Scan A cardiac nuclear scan is a test that is done to check the flow of blood to your heart. It is done when you are resting and when you are exercising. The test looks for problems such as:  Not enough blood reaching a portion of the heart.  The heart muscle not working as it should. You may need  this test if:  You have heart disease.  You have had lab results that are not normal.  You have had heart surgery or a balloon procedure to open up blocked arteries (angioplasty).  You have chest pain.  You have shortness of breath. In this test, a special dye (tracer) is put into your bloodstream. The tracer will travel to your heart. A camera will then take pictures of your heart to see how the tracer moves through your heart. This test is usually done at a hospital and takes 2-4 hours. Tell a doctor about:  Any allergies you have.  All medicines you are taking, including vitamins, herbs, eye drops, creams, and over-the-counter medicines.  Any problems you or family members have had with anesthetic medicines.  Any blood disorders you have.  Any surgeries you have had.  Any medical conditions you have.  Whether you are pregnant or may be pregnant. What are the risks? Generally, this is a safe test. However, problems may occur, such as:  Serious chest pain and heart attack. This is only a risk if the stress portion of the test is done.  Rapid heartbeat.  A feeling of warmth in your chest. This feeling usually does not last long.  Allergic reaction to the tracer. What happens before the test?  Ask your doctor about changing or stopping your normal medicines. This is important.  Follow instructions from your doctor about what you cannot eat or drink.  Remove your jewelry on the day of the test.  What happens during the test?  An IV tube will be inserted into one of your veins.  Your doctor will give you a small amount of tracer through the IV tube.  You will wait for 20-40 minutes while the tracer moves through your bloodstream.  Your heart will be monitored with an electrocardiogram (ECG).  You will lie down on an exam table.  Pictures of your heart will be taken for about 15-20 minutes.  You may also have a stress test. For this test, one of these things may  be done: ? You will be asked to exercise on a treadmill or a stationary bike. ? You will be given medicines that will make your heart work harder. This is done if you are unable to exercise.  When blood flow to your heart has peaked, a tracer will again be given through the IV tube.  After 20-40 minutes, you will get back on the exam table. More pictures will be taken of your heart.  Depending on the tracer that is used, more pictures may need to be taken 3-4 hours later.  Your IV tube will be removed when the test is over. The test may vary among doctors and hospitals. What happens after the test?  Ask your doctor: ? Whether you can return to your normal schedule, including diet, activities, and medicines. ? Whether you should drink more fluids. This will help to remove the tracer from your body. Drink enough fluid to keep your pee (urine) pale yellow.  Ask your doctor, or the department that is doing the test: ? When will my results be ready? ? How will I get my results? Summary  A cardiac nuclear scan is a test that is done to check the flow of blood to your heart.  Tell your doctor whether you are pregnant or may be pregnant.  Before the test, ask your doctor about changing or stopping your normal medicines. This is important.  Ask your doctor whether you can return to your normal activities. You may be asked to drink more fluids. This information is not intended to replace advice given to you by your health care provider. Make sure you discuss any questions you have with your health care provider. Document Released: 02/14/2018 Document Revised: 02/14/2018 Document Reviewed: 02/14/2018 Elsevier Interactive Patient Education  2019 [employer].

## 2018-10-31 ENCOUNTER — Telehealth (HOSPITAL_COMMUNITY): Payer: Self-pay | Admitting: *Deleted

## 2018-10-31 ENCOUNTER — Other Ambulatory Visit: Payer: Self-pay | Admitting: Physician Assistant

## 2018-10-31 DIAGNOSIS — E119 Type 2 diabetes mellitus without complications: Secondary | ICD-10-CM

## 2018-10-31 NOTE — Telephone Encounter (Signed)
Patient given detailed instructions per Myocardial Perfusion Study Information Sheet for the test on 11/02/18 Patient notified to arrive 15 minutes early and that it is imperative to arrive on time for appointment to keep from having the test rescheduled.  If you need to cancel or reschedule your appointment, please call the office within 24 hours of your appointment. . Patient verbalized understanding. Kirstie Peri

## 2018-11-02 ENCOUNTER — Ambulatory Visit (HOSPITAL_COMMUNITY): Payer: Medicare Other | Attending: Internal Medicine

## 2018-11-02 DIAGNOSIS — I2510 Atherosclerotic heart disease of native coronary artery without angina pectoris: Secondary | ICD-10-CM | POA: Insufficient documentation

## 2018-11-02 DIAGNOSIS — Z01810 Encounter for preprocedural cardiovascular examination: Secondary | ICD-10-CM | POA: Insufficient documentation

## 2018-11-02 DIAGNOSIS — I10 Essential (primary) hypertension: Secondary | ICD-10-CM | POA: Insufficient documentation

## 2018-11-02 DIAGNOSIS — E119 Type 2 diabetes mellitus without complications: Secondary | ICD-10-CM | POA: Diagnosis not present

## 2018-11-02 DIAGNOSIS — I359 Nonrheumatic aortic valve disorder, unspecified: Secondary | ICD-10-CM | POA: Diagnosis not present

## 2018-11-02 DIAGNOSIS — Z794 Long term (current) use of insulin: Secondary | ICD-10-CM | POA: Insufficient documentation

## 2018-11-02 DIAGNOSIS — IMO0001 Reserved for inherently not codable concepts without codable children: Secondary | ICD-10-CM

## 2018-11-02 DIAGNOSIS — E781 Pure hyperglyceridemia: Secondary | ICD-10-CM | POA: Insufficient documentation

## 2018-11-02 LAB — MYOCARDIAL PERFUSION IMAGING
CHL CUP NUCLEAR SSS: 0
LV dias vol: 64 mL (ref 62–150)
LV sys vol: 29 mL
Peak HR: 96 {beats}/min
Rest HR: 81 {beats}/min
SDS: 0
SRS: 0
TID: 1

## 2018-11-02 MED ORDER — TECHNETIUM TC 99M TETROFOSMIN IV KIT
31.7000 | PACK | Freq: Once | INTRAVENOUS | Status: AC | PRN
  Administered 2018-11-02: 31.7 via INTRAVENOUS
  Filled 2018-11-02: qty 32

## 2018-11-02 MED ORDER — REGADENOSON 0.4 MG/5ML IV SOLN
0.4000 mg | Freq: Once | INTRAVENOUS | Status: AC
  Administered 2018-11-02: 0.4 mg via INTRAVENOUS

## 2018-11-02 MED ORDER — TECHNETIUM TC 99M TETROFOSMIN IV KIT
10.6000 | PACK | Freq: Once | INTRAVENOUS | Status: AC | PRN
  Administered 2018-11-02: 10.6 via INTRAVENOUS
  Filled 2018-11-02: qty 11

## 2018-11-04 ENCOUNTER — Telehealth: Payer: Self-pay | Admitting: Physician Assistant

## 2018-11-04 NOTE — Telephone Encounter (Addendum)
   Primary Cardiologist: Candee Furbish, MD  Chart revisited for pre-op clearance following stress test. Nuclear stress test was normal and he has not been describing any recent anginal symptoms. Therefore, based on ACC/AHA guidelines, Michael Guzman would be at acceptable risk for right total hip arthroplasty without further cardiovascular testing. Per earlier discussion with Dr. Marlou Porch, if the surgeon feels that aspirin must be held for his hip surgery, Dr. Marlou Porch feels he may hold ASA for 5-7 days if necessary. We would recommend to resume as soon as felt safe by surgery team. I will route this recommendation to the requesting party (per 1/28 phone note) via Epic fax function.  Please call with questions.  Charlie Pitter, PA-C 11/04/2018, 7:02 AM

## 2018-11-14 ENCOUNTER — Other Ambulatory Visit: Payer: Self-pay | Admitting: Orthopedic Surgery

## 2018-11-15 ENCOUNTER — Other Ambulatory Visit: Payer: Self-pay | Admitting: Internal Medicine

## 2018-11-15 ENCOUNTER — Ambulatory Visit: Payer: Medicare Other | Admitting: Cardiology

## 2018-11-15 DIAGNOSIS — M150 Primary generalized (osteo)arthritis: Principal | ICD-10-CM

## 2018-11-15 DIAGNOSIS — M159 Polyosteoarthritis, unspecified: Secondary | ICD-10-CM

## 2018-11-15 DIAGNOSIS — M8949 Other hypertrophic osteoarthropathy, multiple sites: Secondary | ICD-10-CM

## 2018-11-15 DIAGNOSIS — R61 Generalized hyperhidrosis: Secondary | ICD-10-CM

## 2018-11-18 NOTE — Patient Instructions (Addendum)
ROCHELLE NEPHEW  11/18/2018   Your procedure is scheduled on: 11-25-2018   Report to [facility] Main  Entrance     Report to admitting at 10:45AM    Call this number if you have problems the morning of surgery [phone]      Remember: Do not eat food :After Midnight. YOU MAY HAVE CLEAR LIQUIDS FROM MIDNIGHT UNTIL 7:15AM. NOTHING BY MOUTH AFTER 7:15AM!  BRUSH YOUR TEETH MORNING OF SURGERY AND RINSE YOUR MOUTH OUT, NO CHEWING GUM CANDY OR MINTS.      CLEAR LIQUID DIET   Foods Allowed                                                                     Foods Excluded  Coffee and tea, regular and decaf                             liquids that you cannot  Plain Jell-O in any flavor                                             see through such as: Fruit ices (not with fruit pulp)                                     milk, soups, orange juice  Iced Popsicles                                    All solid food Carbonated beverages, regular and diet                                    Cranberry, grape and apple juices Sports drinks like Gatorade Lightly seasoned clear broth or consume(fat free) Sugar, honey syrup  Sample Menu Breakfast                                Lunch                                     Supper Cranberry juice                    Beef broth                            Chicken broth Jell-O                                     Grape juice  Apple juice Coffee or tea                        Jell-O                                      Popsicle                                                Coffee or tea                        Coffee or tea  _____________________________________________________________________       Take these medicines the morning of surgery with A SIP OF WATER: metoprolol, Wellbutrin, gabapentin, Protonix, atorvastatin   DO NOT TAKE ANY METFORMIN DAY OF YOUR SURGERY YOU MUST CONTACT YOUR PRIMARY  CARE DOCTOR FOR INSTRUCTIONS FOR YOUR INUSLIN 70/30!!  CHECK YOUR BLOOD SUGAR THE MORNING OF SURGERY . REPORT THIS TO YOUR NURSE ON ARRIVAL                                 You may not have any metal on your body including hair pins and              piercings  Do not wear jewelry, make-up, lotions, powders or perfumes, deodorant                       Men may shave face and neck.   Do not bring valuables to the hospital. [place].  Contacts, dentures or bridgework may not be worn into surgery.  Leave suitcase in the car. After surgery it may be brought to your room.                  Please read over the following fact sheets you were given: _____________________________________________________________________             [facility] [place] - Preparing for Surgery Before surgery, you can play an important role.  Because skin is not sterile, your skin needs to be as free of germs as possible.  You can reduce the number of germs on your skin by washing with CHG (chlorahexidine gluconate) soap before surgery.  CHG is an antiseptic cleaner which kills germs and bonds with the skin to continue killing germs even after washing. Please DO NOT use if you have an allergy to CHG or antibacterial soaps.  If your skin becomes reddened/irritated stop using the CHG and inform your nurse when you arrive at Short Stay. Do not shave (including legs and underarms) for at least 48 hours prior to the first CHG shower.  You may shave your face/neck. Please follow these instructions carefully:  1.  Shower with CHG Soap the night before surgery and the  morning of Surgery.  2.  If you choose to wash your hair, wash your hair first as usual with your  normal  shampoo.  3.  After you shampoo, rinse your hair and body thoroughly to remove the  shampoo.  4.  Use CHG as you would any other liquid soap.  You can apply chg directly  to the skin and  wash                       Gently with a scrungie or clean washcloth.  5.  Apply the CHG Soap to your body ONLY FROM THE NECK DOWN.   Do not use on face/ open                           Wound or open sores. Avoid contact with eyes, ears mouth and genitals (private parts).                       Wash face,  Genitals (private parts) with your normal soap.             6.  Wash thoroughly, paying special attention to the area where your surgery  will be performed.  7.  Thoroughly rinse your body with warm water from the neck down.  8.  DO NOT shower/wash with your normal soap after using and rinsing off  the CHG Soap.                9.  Pat yourself dry with a clean towel.            10.  Wear clean pajamas.            11.  Place clean sheets on your bed the night of your first shower and do not  sleep with pets. Day of Surgery : Do not apply any lotions/deodorants the morning of surgery.  Please wear clean clothes to the hospital/surgery center.  FAILURE TO FOLLOW THESE INSTRUCTIONS MAY RESULT IN THE CANCELLATION OF YOUR SURGERY PATIENT SIGNATURE_________________________________  NURSE SIGNATURE__________________________________  ________________________________________________________________________   Adam Phenix  An incentive spirometer is a tool that can help keep your lungs clear and active. This tool measures how well you are filling your lungs with each breath. Taking long deep breaths may help reverse or decrease the chance of developing breathing (pulmonary) problems (especially infection) following:  A long period of time when you are unable to move or be active. BEFORE THE PROCEDURE   If the spirometer includes an indicator to show your best effort, your nurse or respiratory therapist will set it to a desired goal.  If possible, sit up straight or lean slightly forward. Try not to slouch.  Hold the incentive spirometer in an upright position. INSTRUCTIONS FOR USE   1. Sit on the edge of your bed if possible, or sit up as far as you can in bed or on a chair. 2. Hold the incentive spirometer in an upright position. 3. Breathe out normally. 4. Place the mouthpiece in your mouth and seal your lips tightly around it. 5. Breathe in slowly and as deeply as possible, raising the piston or the ball toward the top of the column. 6. Hold your breath for 3-5 seconds or for as long as possible. Allow the piston or ball to fall to the bottom of the column. 7. Remove the mouthpiece from your mouth and breathe out normally. 8. Rest for a few seconds and repeat Steps 1 through 7 at least 10 times every 1-2 hours when you are awake. Take your time and take a few normal breaths between deep breaths. 9. The spirometer may include an indicator to  show your best effort. Use the indicator as a goal to work toward during each repetition. 10. After each set of 10 deep breaths, practice coughing to be sure your lungs are clear. If you have an incision (the cut made at the time of surgery), support your incision when coughing by placing a pillow or rolled up towels firmly against it. Once you are able to get out of bed, walk around indoors and cough well. You may stop using the incentive spirometer when instructed by your caregiver.  RISKS AND COMPLICATIONS  Take your time so you do not get dizzy or light-headed.  If you are in pain, you may need to take or ask for pain medication before doing incentive spirometry. It is harder to take a deep breath if you are having pain. AFTER USE  Rest and breathe slowly and easily.  It can be helpful to keep track of a log of your progress. Your caregiver can provide you with a simple table to help with this. If you are using the spirometer at home, follow these instructions: [place] IF:   You are having difficultly using the spirometer.  You have trouble using the spirometer as often as instructed.  Your pain medication is  not giving enough relief while using the spirometer.  You develop fever of 100.5 F (38.1 C) or higher. SEEK IMMEDIATE MEDICAL CARE IF:   You cough up bloody sputum that had not been present before.  You develop fever of 102 F (38.9 C) or greater.  You develop worsening pain at or near the incision site. MAKE SURE YOU:   Understand these instructions.  Will watch your condition.  Will get help right away if you are not doing well or get worse. Document Released: 01/11/2007 Document Revised: 11/23/2011 Document Reviewed: 03/14/2007 [facility] Patient Information 2014 [place], [place].   ________________________________________________________________________

## 2018-11-18 NOTE — Progress Notes (Signed)
Cardiac clearance Melina Copa PA-C 11-04-2018 epic tele note  lov cards 10-26-2018 epic   ekg 10-26-2018 epic   Stress test 11-02-2018 epic   Echo 02-02-18 epic

## 2018-11-21 ENCOUNTER — Encounter (HOSPITAL_COMMUNITY)
Admission: RE | Admit: 2018-11-21 | Discharge: 2018-11-21 | Disposition: A | Discharge status: Home / Self Care | Payer: Medicare Other | Source: Ambulatory Visit | Attending: Orthopedic Surgery | Admitting: Orthopedic Surgery

## 2018-11-21 ENCOUNTER — Ambulatory Visit (HOSPITAL_COMMUNITY)
Admission: RE | Admit: 2018-11-21 | Discharge: 2018-11-21 | Disposition: A | Discharge status: Home / Self Care | Payer: Medicare Other | Source: Ambulatory Visit | Attending: Orthopedic Surgery | Admitting: Orthopedic Surgery

## 2018-11-21 ENCOUNTER — Other Ambulatory Visit: Payer: Self-pay

## 2018-11-21 ENCOUNTER — Encounter (HOSPITAL_COMMUNITY): Payer: Self-pay

## 2018-11-21 DIAGNOSIS — Z01818 Encounter for other preprocedural examination: Secondary | ICD-10-CM | POA: Diagnosis not present

## 2018-11-21 DIAGNOSIS — Z01811 Encounter for preprocedural respiratory examination: Secondary | ICD-10-CM

## 2018-11-21 LAB — GLUCOSE, CAPILLARY: Glucose-Capillary: 200 mg/dL — ABNORMAL HIGH (ref 70–99)

## 2018-11-21 LAB — COMPREHENSIVE METABOLIC PANEL
ALT: 52 U/L — ABNORMAL HIGH (ref 0–44)
AST: 45 U/L — ABNORMAL HIGH (ref 15–41)
Albumin: 4.3 g/dL (ref 3.5–5.0)
Alkaline Phosphatase: 79 U/L (ref 38–126)
Anion gap: 11 (ref 5–15)
BUN: 16 mg/dL (ref 8–23)
CO2: 19 mmol/L — ABNORMAL LOW (ref 22–32)
Calcium: 9.7 mg/dL (ref 8.9–10.3)
Chloride: 109 mmol/L (ref 98–111)
Creatinine, Ser: 1.08 mg/dL (ref 0.61–1.24)
GFR calc non Af Amer: >60 mL/min (ref >60)
Glucose, Bld: 208 mg/dL — ABNORMAL HIGH (ref 70–99)
Potassium: 3.9 mmol/L (ref 3.5–5.1)
Sodium: 139 mmol/L (ref 135–145)
Total Bilirubin: 0.4 mg/dL (ref 0.3–1.2)
Total Protein: 7 g/dL (ref 6.5–8.1)

## 2018-11-21 LAB — URINALYSIS, ROUTINE W REFLEX MICROSCOPIC
BILIRUBIN URINE: NEGATIVE
Bacteria, UA: NONE SEEN
Glucose, UA: >=500 mg/dL — AB
Hgb urine dipstick: NEGATIVE
Ketones, ur: 5 mg/dL — AB
Leukocytes,Ua: NEGATIVE
Nitrite: NEGATIVE
Protein, ur: NEGATIVE mg/dL
Specific Gravity, Urine: 1.024 (ref 1.005–1.030)
pH: 5 (ref 5.0–8.0)

## 2018-11-21 LAB — CBC WITH DIFFERENTIAL/PLATELET
Abs Immature Granulocytes: 0.03 10*3/uL (ref 0.00–0.07)
Basophils Absolute: 0 10*3/uL (ref 0.0–0.1)
Basophils Relative: 1 %
Eosinophils Absolute: 0.1 10*3/uL (ref 0.0–0.5)
Eosinophils Relative: 3 %
HCT: 40.4 % (ref 39.0–52.0)
Hemoglobin: 12.6 g/dL — ABNORMAL LOW (ref 13.0–17.0)
Immature Granulocytes: 1 %
Lymphocytes Relative: 20 %
Lymphs Abs: 0.9 10*3/uL (ref 0.7–4.0)
MCH: 29.4 pg (ref 26.0–34.0)
MCHC: 31.2 g/dL (ref 30.0–36.0)
MCV: 94.2 fL (ref 80.0–100.0)
Monocytes Absolute: 0.7 10*3/uL (ref 0.1–1.0)
Monocytes Relative: 14 %
NEUTROS ABS: 2.8 10*3/uL (ref 1.7–7.7)
Neutrophils Relative %: 61 %
PLATELETS: 198 10*3/uL (ref 150–400)
RBC: 4.29 MIL/uL (ref 4.22–5.81)
RDW: 13.4 % (ref 11.5–15.5)
WBC: 4.6 10*3/uL (ref 4.0–10.5)
nRBC: 0 % (ref 0.0–0.2)

## 2018-11-21 LAB — ABO/RH: ABO/RH(D): O POS

## 2018-11-21 LAB — SURGICAL PCR SCREEN
MRSA, PCR: NEGATIVE
Staphylococcus aureus: NEGATIVE

## 2018-11-21 LAB — APTT: aPTT: 30 seconds (ref 24–36)

## 2018-11-21 LAB — PROTIME-INR
INR: 1 (ref 0.8–1.2)
Prothrombin Time: 13 seconds (ref 11.4–15.2)

## 2018-11-21 LAB — HEMOGLOBIN A1C
Hgb A1c MFr Bld: 9 % — ABNORMAL HIGH (ref 4.8–5.6)
Mean Plasma Glucose: 211.6 mg/dL

## 2018-11-21 NOTE — Progress Notes (Signed)
   11/21/18 1352  OBSTRUCTIVE SLEEP APNEA  Have you ever been diagnosed with sleep apnea through a sleep study? No  Do you snore loudly (loud enough to be heard through closed doors)?  1  Do you often feel tired, fatigued, or sleepy during the daytime (such as falling asleep during driving or talking to someone)? 0  Has anyone observed you stop breathing during your sleep? 0  Do you have, or are you being treated for high blood pressure? 1  BMI more than 35 kg/m2? 1  Age > 50 (1-yes) 1  Neck circumference greater than:Male 16 inches or larger, Male 17inches or larger? 1  Male Gender (Yes=1) 1  Obstructive Sleep Apnea Score 6

## 2018-11-22 ENCOUNTER — Encounter (HOSPITAL_COMMUNITY): Payer: Self-pay | Admitting: Physician Assistant

## 2018-11-22 NOTE — Progress Notes (Signed)
Anesthesia Chart Review   Case:  [id number] Date/Time:  11/25/18 1300   Procedure:  TOTAL HIP ARTHROPLASTY ANTERIOR APPROACH (Right )   Anesthesia type:  Spinal   Pre-op diagnosis:  RIGHT HIP PAIN WITH DEGENERATIVE JOINT DISEASE   Location:  [place] [facility] / [facility]   Surgeon:  Dorna Leitz, MD      DISCUSSION:73 yo former smoker (quit 07/27/11) with h/o HTN, DM II, GERD, CAD (DES to mid LAD and OM2 2013), prostate cancer (s/p radiation), right hip DJD scheduled for above procedure 11/25/18 with Dr. Dorna Leitz.   Cardiac clearance received 11/04/18.  Per Melina Copa, PA-C, "Nuclear stress test was normal and he has not been describing any recent anginal symptoms. Therefore, based on ACC/AHA guidelines, GLENROY CROSSEN would be at acceptable risk for right total hip arthroplasty without further cardiovascular testing. Per earlier discussion with Dr. Marlou Porch, if the surgeon feels that aspirin must be held for his hip surgery, Dr. Marlou Porch feels he may hold ASA for 5-7 days if necessary. We would recommend to resume as soon as felt safe by surgery team."  At PAT visit 11/21/18 Blood glucose 200 and A1C 9.0.  Dr. Berenice Primas made aware of lab results.   VS: BP (!) 151/79 (BP Location: Right Arm)   Pulse 86   Temp 37.1 C (Oral)   Resp 18   Ht 5' 10"  (1.778 m)   SpO2 97%   BMI 33.00 kg/m   PROVIDERS: [name], MD is PCP   Candee Furbish, MD is Cardiologist  LABS: Surgeon made aware (all labs ordered are listed, but only abnormal results are displayed)  Labs Reviewed  GLUCOSE, CAPILLARY - Abnormal; Notable for the following components:      Result Value   Glucose-Capillary 200 (*)    All other components within normal limits  HEMOGLOBIN A1C - Abnormal; Notable for the following components:   Hgb A1c MFr Bld 9.0 (*)    All other components within normal limits  CBC WITH DIFFERENTIAL/PLATELET - Abnormal; Notable for the following components:   Hemoglobin 12.6 (*)    All other components within  normal limits  COMPREHENSIVE METABOLIC PANEL - Abnormal; Notable for the following components:   CO2 19 (*)    Glucose, Bld 208 (*)    AST 45 (*)    ALT 52 (*)    All other components within normal limits  URINALYSIS, ROUTINE W REFLEX MICROSCOPIC - Abnormal; Notable for the following components:   Glucose, UA >=500 (*)    Ketones, ur 5 (*)    All other components within normal limits  SURGICAL PCR SCREEN  APTT  PROTIME-INR  TYPE AND SCREEN  ABO/RH     IMAGES: Chest Xray 11/21/2018 FINDINGS: Cardiac shadows within normal limits. Aortic calcifications are again seen. The lungs are clear bilaterally. Previously seen nodular density on the right is no longer identified and is felt to be artifactual in nature given multiple extrinsic leads. Left basilar scarring is again noted. No bony abnormality is seen.  IMPRESSION: No active cardiopulmonary disease.  EKG: 10/26/18 Rate 92 bpm Normal sinus rhythm  Normal ECG  CV: Stress Test 11/02/18  Nuclear stress EF: 55%.  No T wave inversion was noted during stress.  There was no ST segment deviation noted during stress.  The study is normal.  This is a low risk study.   Normal perfusion. LVEF 55% with normal wall motion. This is a low risk study. Compared with a prior study in  2017, there is no longer ischemia.  Echo 02/02/18 Study Conclusions  - Left ventricle: The cavity size was normal. There was mild   concentric hypertrophy. Systolic function was normal. The   estimated ejection fraction was in the range of 60% to 65%. Wall   motion was normal; there were no regional wall motion   abnormalities. Doppler parameters are consistent with abnormal   left ventricular relaxation (grade 1 diastolic dysfunction).   There was no evidence of elevated ventricular filling pressure by   Doppler parameters. - Aortic valve: Valve area (VTI): 3.87 cm^2. Valve area (Vmax):   3.61 cm^2. Valve area (Vmean): 3.59 cm^2. - Mitral  valve: There was mild regurgitation. Valve area by   pressure half-time: 2.44 cm^2. - Left atrium: The atrium was normal in size. - Right ventricle: Systolic function was normal. - Tricuspid valve: There was mild regurgitation. - Pulmonary arteries: Systolic pressure was within the normal   range. - Inferior vena cava: The vessel was normal in size. - Pericardium, extracardiac: There was no pericardial effusion. Past Medical History:  Diagnosis Date  . Arthritis   . CAD (coronary artery disease)    a.s/p DES to mid LAD and OM2 06/2012.  Marland Kitchen Chronic back pain   . Chronically dry eyes   . Diabetes mellitus without complication ([place])    TYPE 2   . Elevated PSA    being monitored  . GERD (gastroesophageal reflux disease)   . Hypertension   . Hypertriglyceridemia   . Prostate cancer ([place])    RADTIATION     Past Surgical History:  Procedure Laterality Date  . APPENDECTOMY    . CARDIAC SURGERY     3 stent placed.  . COLONOSCOPY  02/12/2010   [place]  . LEFT HEART CATHETERIZATION WITH CORONARY ANGIOGRAM N/A 06/27/2012   Procedure: LEFT HEART CATHETERIZATION WITH CORONARY ANGIOGRAM;  Surgeon: Candee Furbish, MD;  Location: [facility] CATH LAB;  Service: Cardiovascular;  Laterality: N/A;  . LEFT HEART CATHETERIZATION WITH CORONARY ANGIOGRAM N/A 07/25/2012   Procedure: LEFT HEART CATHETERIZATION WITH CORONARY ANGIOGRAM;  Surgeon: Sueanne Margarita, MD;  Location: [place] CATH LAB;  Service: Cardiovascular;  Laterality: N/A;  . PROSTATE BIOPSY    . RADIOLOGY WITH ANESTHESIA N/A 12/03/2015   Procedure: MRI LUMBAR SPINE;  Surgeon: Medication Radiologist, MD;  Location: [place];  Service: Radiology;  Laterality: N/A;    MEDICATIONS: . aspirin EC 81 MG tablet  . atorvastatin (LIPITOR) 20 MG tablet  . Blood Glucose Monitoring Suppl (ONE TOUCH ULTRA SYSTEM KIT) w/Device KIT  . buPROPion (WELLBUTRIN XL) 300 MG 24 hr tablet  . cholecalciferol (VITAMIN D) 25 MCG (1000 UT) tablet  . Dulaglutide (TRULICITY) 1.5  BJ/6.2GB SOPN  . DULoxetine (CYMBALTA) 60 MG capsule  . fluticasone (FLONASE) 50 MCG/ACT nasal spray  . furosemide (LASIX) 40 MG tablet  . gabapentin (NEURONTIN) 800 MG tablet  . glucose blood (ONE TOUCH ULTRA TEST) test strip  . hydrocortisone (ANUSOL-HC) 25 MG suppository  . insulin NPH-regular Human (70-30) 100 UNIT/ML injection  . Magnesium 400 MG CAPS  . megestrol (MEGACE) 20 MG tablet  . metFORMIN (GLUCOPHAGE-XR) 500 MG 24 hr tablet  . metoprolol succinate (TOPROL-XL) 25 MG 24 hr tablet  . montelukast (SINGULAIR) 10 MG tablet  . Multiple Vitamin (MULTIVITAMIN WITH MINERALS) TABS tablet  . nitroGLYCERIN (NITROSTAT) 0.4 MG SL tablet  . pantoprazole (PROTONIX) 40 MG tablet  . tamsulosin (FLOMAX) 0.4 MG CAPS capsule  . tiZANidine (ZANAFLEX) 4 MG tablet  . triamcinolone  cream (KENALOG) 0.5 %   No current facility-administered medications for this encounter.      Maia Plan [facility] Pre-Surgical Testing [phone] 11/22/18 12:04 PM

## 2018-11-25 ENCOUNTER — Encounter (HOSPITAL_COMMUNITY): Admission: RE | Payer: Self-pay | Source: Home / Self Care

## 2018-11-25 ENCOUNTER — Ambulatory Visit (HOSPITAL_COMMUNITY): Admission: RE | Admit: 2018-11-25 | Payer: Medicare Other | Source: Home / Self Care | Admitting: Orthopedic Surgery

## 2018-11-25 LAB — TYPE AND SCREEN
ABO/RH(D): O POS
Antibody Screen: NEGATIVE

## 2018-11-25 SURGERY — ARTHROPLASTY, HIP, TOTAL, ANTERIOR APPROACH
Anesthesia: Spinal | Laterality: Right

## 2018-11-28 NOTE — Progress Notes (Signed)
FOLLOW UP  Assessment and Plan:   CAD Control blood pressure, cholesterol, glucose, increase exercise.  Followed by Dr. Marlou Porch (cardiology)  Hypertension Well controlled with current medications  Monitor blood pressure at home; patient to call if consistently greater than 130/80 Continue DASH diet.   Reminder to go to the ER if any CP, SOB, nausea, dizziness, severe HA, changes vision/speech, left arm numbness and tingling and jaw pain.  Cholesterol Currently at goal; continue statin Continue low cholesterol diet and exercise.  Check lipid panel.   Diabetes with other circulatory complications Continue medication: metformin 2000 mg daily, novolin 70/30 120 units BID, couldn't afford Jardiance  Continue diet and exercise.  Perform daily foot/skin check, notify office of any concerning changes.  Defer A1C as just had for pre-op  Obesity with co morbidities Long discussion about weight loss, diet, and exercise Recommended diet heavy in fruits and veggies and low in animal meats, cheeses, and dairy products, appropriate calorie intake Discussed ideal weight for height  Patient will work on reducing portion sizes of carbohydrate rich foods; reviewed these in detail today.  Will follow up in 3 months  Vitamin D Def At goal at last visit; continue supplementation to maintain goal of 70-100 Defer Vit D level  R hip pain Followed by Dr. Berenice Primas, pending recommended R total hip arthroplasty Significantly limiting, though reports of upper iliac crest pain are not expected/typical for joint pathology; will request ortho reports to review and clarify as patient is poor historian with limited health literacy.   Continue diet and meds as discussed. Further disposition pending results of labs. Discussed med's effects and SE's.   Over 30 minutes of exam, counseling, chart review, and critical decision making was performed.   Future Appointments  Date Time Provider [place]   03/07/2019 10:00 AM [name], MD [facility] None  06/14/2019 11:15 AM Liane Comber, NP [facility] None    ----------------------------------------------------------------------------------------------------------------------  HPI 73 y.o. male  presents for 3 month follow up on hypertension, cholesterol, diabetes, weight and vitamin D deficiency. He has hx of ASCAD s/p PTCA (06/2012), last stress test 04/2016, followed by Dr. Marlou Porch; hx of prostate cancer treated by radiation in 2016-2017; he is followed by Dr. Junious Silk, recently completed lupron shots this past April. He is now also followed by Dr. Cruzita Lederer for T2DM due to poor control and for thyroiditis.   He is pending R hip surgery (joint replacement) by Dr. Berenice Primas for persistent hip pain; this was apparently postponed in light of recent A1C of 9%. There is some confusion as to whether he has arthritis, has been told "there's plenty of cartilage", today he is reporting pain is upper hip (points to iliac crest area). Will request office reports from Dr. Berenice Primas which we have apparently not been receiving to clarify.   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   BMI is Body mass index is 33.82 kg/m., he has not been working on diet and exercise. Wt Readings from Last 3 Encounters:  11/29/18 229 lb (103.9 kg)  11/02/18 230 lb (104.3 kg)  10/26/18 230 lb 12.8 oz (104.7 kg)    His blood pressure has been controlled at home, today their BP is BP: 122/82  He does not workout. He denies chest pain, shortness of breath, dizziness.   He is on cholesterol medication Atorvastatin 20 mg daily and denies myalgias. His cholesterol  is at goal. The cholesterol last visit was:   Lab Results  Component Value Date   CHOL 117 08/30/2018   HDL 42 08/30/2018   LDLCALC 47 08/30/2018   LDLDIRECT 45.7 10/30/2013   TRIG 222 (H)  08/30/2018   CHOLHDL 2.8 08/30/2018    He has been working on diet and exercise for T2DM treated by metfromin 2000 mg daiy, novolin 70/30 taking 120 units BID, and denies hypoglycemia , increased appetite, nausea, polydipsia, polyuria, visual disturbances and vomiting. He is checking fasting blood sugars, running 160-180 this week. Last A1C in the office was significantly improved from previous (11.6%):  Lab Results  Component Value Date   HGBA1C 9.0 (H) 11/21/2018   He has hx of thyroiditis and low TSHs though recently normalized and followed by Dr. Cruzita Lederer Lab Results  Component Value Date   TSH 0.61 08/30/2018   Patient is on Vitamin D supplement and at goal   Lab Results  Component Value Date   VD25OH 76 08/30/2018        Current Medications:  Current Outpatient Medications on File Prior to Visit  Medication Sig  . aspirin EC 81 MG tablet Take 81 mg by mouth daily.  Marland Kitchen atorvastatin (LIPITOR) 20 MG tablet TAKE 1 TABLET BY MOUTH  DAILY (Patient taking differently: Take 20 mg by mouth daily. )  . Blood Glucose Monitoring Suppl (ONE TOUCH ULTRA SYSTEM KIT) w/Device KIT Check blood sugar 1 time daily-DX-E11.22  . buPROPion (WELLBUTRIN XL) 300 MG 24 hr tablet Take 1 tablet every morning (Patient taking differently: Take 300 mg by mouth every morning. )  . cholecalciferol (VITAMIN D) 25 MCG (1000 UT) tablet Take 2,000 Units by mouth daily.   . furosemide (LASIX) 40 MG tablet Take 1 tablet 2 x/ day as needed  for fluid retention (Patient taking differently: Take 40 mg by mouth 2 (two) times daily as needed for fluid. Take 1 tablet 2 x/ day as needed  for fluid retention)  . gabapentin (NEURONTIN) 800 MG tablet TAKE 1 TABLET BY MOUTH 3 TO 4 TIMES DAILY FOR PAIN, HOT FLASHES & SWEATS (Patient taking differently: Take 1,600 mg by mouth 2 (two) times daily. TAKE 1 TABLET BY MOUTH 3 TO 4 TIMES DAILY FOR PAIN, HOT FLASHES & SWEATS)  . glucose blood (ONE TOUCH ULTRA TEST) test strip USE WITH METER  TO CHECK  BLOOD SUGAR 2 TIMES DAILY  . hydrocortisone (ANUSOL-HC) 25 MG suppository Place 1 suppository (25 mg total) rectally daily as needed for hemorrhoids.  . insulin NPH-regular Human (70-30) 100 UNIT/ML injection Inject 110-120 Units into the skin See admin instructions. Inject 120 units subcutaneously in the morning and 110 units in the afternoon  . Magnesium 400 MG CAPS Take 400 mg by mouth daily.   . megestrol (MEGACE) 20 MG tablet Take 20 mg by mouth 2 (two) times daily.   . metFORMIN (GLUCOPHAGE-XR) 500 MG 24 hr tablet TAKE 1 TO 2 TABLETS BY  MOUTH TWO TIMES DAILY AS  DIRECTED FOR DIABETES (Patient taking differently: Take 1,000 mg by mouth 2 (two) times daily with a meal. )  . metoprolol succinate (TOPROL-XL) 25 MG 24 hr tablet TAKE ONE-HALF TABLET BY  MOUTH DAILY. TAKE WITH OR  IMMEDIATELY FOLLOWING A  MEAL. (Patient taking differently: Take 12.5 mg by mouth daily. TAKE ONE-HALF TABLET BY  MOUTH DAILY. TAKE WITH OR  IMMEDIATELY FOLLOWING A  MEAL.)  . montelukast (SINGULAIR) 10 MG tablet TAKE 1 TABLET BY MOUTH AT  BEDTIME (  Patient taking differently: Take 10 mg by mouth at bedtime. )  . Multiple Vitamin (MULTIVITAMIN WITH MINERALS) TABS tablet Take 1 tablet by mouth daily.  . nitroGLYCERIN (NITROSTAT) 0.4 MG SL tablet PLACE 1 TABLET UNDER THE TONGUE EVERY 5 MINUTES AS NEEDED FOR CHEST PAIN (Patient taking differently: Place 0.4 mg under the tongue every 5 (five) minutes as needed for chest pain. PLACE 1 TABLET UNDER THE TONGUE EVERY 5 MINUTES AS NEEDED FOR CHEST PAIN)  . pantoprazole (PROTONIX) 40 MG tablet TAKE 1 TABLET BY MOUTH  DAILY (Patient taking differently: Take 40 mg by mouth daily. TAKE 1 TABLET BY MOUTH DAILY)  . tamsulosin (FLOMAX) 0.4 MG CAPS capsule Take 0.4 mg by mouth at bedtime.  Marland Kitchen tiZANidine (ZANAFLEX) 4 MG tablet Take 1 tablet (4 mg total) by mouth 2 (two) times daily as needed for muscle spasms. (Patient not taking: Reported on 11/18/2018)   No current  facility-administered medications on file prior to visit.      Allergies: No Known Allergies   Medical History:  Past Medical History:  Diagnosis Date  . Arthritis   . CAD (coronary artery disease)    a.s/p DES to mid LAD and OM2 06/2012.  Marland Kitchen Chronic back pain   . Chronically dry eyes   . Diabetes mellitus without complication ([place])    TYPE 2   . Elevated PSA    being monitored  . GERD (gastroesophageal reflux disease)   . Hypertension   . Hypertriglyceridemia   . Prostate cancer ([place])    RADTIATION    Family history- Reviewed and unchanged Social history- Reviewed and unchanged   Review of Systems:  Review of Systems  Constitutional: Negative for malaise/fatigue and weight loss.  HENT: Negative for hearing loss and tinnitus.   Eyes: Negative for blurred vision and double vision.  Respiratory: Negative for cough, shortness of breath and wheezing.   Cardiovascular: Negative for chest pain, palpitations, orthopnea, claudication and leg swelling.  Gastrointestinal: Negative for abdominal pain, blood in stool, constipation, diarrhea, heartburn, melena, nausea and vomiting.  Genitourinary: Negative.   Musculoskeletal: Negative for joint pain and myalgias.  Skin: Negative for rash.  Neurological: Negative for dizziness, tingling, sensory change, weakness and headaches.  Endo/Heme/Allergies: Negative for polydipsia.  Psychiatric/Behavioral: Negative.   All other systems reviewed and are negative.     Physical Exam: BP 122/82   Pulse 92   Temp (!) 97.3 F (36.3 C)   Ht 5' 9" (1.753 m)   Wt 229 lb (103.9 kg)   SpO2 96%   BMI 33.82 kg/m  Wt Readings from Last 3 Encounters:  11/29/18 229 lb (103.9 kg)  11/02/18 230 lb (104.3 kg)  10/26/18 230 lb 12.8 oz (104.7 kg)   General Appearance: Well nourished, in no apparent distress. Eyes: PERRLA, EOMs, conjunctiva no swelling or erythema Sinuses: No Frontal/maxillary tenderness ENT/Mouth: Ext aud canals clear, TMs  without erythema, bulging. No erythema, swelling, or exudate on post pharynx.  Tonsils not swollen or erythematous. Hearing normal.  Neck: Supple, thyroid normal.  Respiratory: Respiratory effort normal, BS equal bilaterally without rales, rhonchi, wheezing or stridor.  Cardio: RRR with no MRGs. Brisk peripheral pulses without edema.  Abdomen: Soft, + BS.  Non tender, no guarding, rebound, hernias, masses. Lymphatics: Non tender without lymphadenopathy.  Musculoskeletal: Full ROM, 5/5 strength, Normal gait Skin: Warm, dry without rashes, lesions, ecchymosis.  Neuro: Cranial nerves intact. No cerebellar symptoms.  Psych: Awake and oriented X 3, normal affect, Insight and Judgment appropriate.  Michael Ribas, NP 11:26 AM [facility]

## 2018-11-29 ENCOUNTER — Other Ambulatory Visit: Payer: Self-pay | Admitting: Cardiology

## 2018-11-29 ENCOUNTER — Other Ambulatory Visit: Payer: Self-pay

## 2018-11-29 ENCOUNTER — Ambulatory Visit (INDEPENDENT_AMBULATORY_CARE_PROVIDER_SITE_OTHER): Payer: Medicare Other | Admitting: Adult Health

## 2018-11-29 ENCOUNTER — Encounter: Payer: Self-pay | Admitting: Adult Health

## 2018-11-29 VITALS — BP 122/82 | HR 92 | Temp 97.3°F | Ht 69.0 in | Wt 229.0 lb

## 2018-11-29 DIAGNOSIS — E069 Thyroiditis, unspecified: Secondary | ICD-10-CM

## 2018-11-29 DIAGNOSIS — E559 Vitamin D deficiency, unspecified: Secondary | ICD-10-CM

## 2018-11-29 DIAGNOSIS — E1169 Type 2 diabetes mellitus with other specified complication: Secondary | ICD-10-CM | POA: Diagnosis not present

## 2018-11-29 DIAGNOSIS — E1165 Type 2 diabetes mellitus with hyperglycemia: Secondary | ICD-10-CM

## 2018-11-29 DIAGNOSIS — I10 Essential (primary) hypertension: Secondary | ICD-10-CM

## 2018-11-29 DIAGNOSIS — E1142 Type 2 diabetes mellitus with diabetic polyneuropathy: Secondary | ICD-10-CM

## 2018-11-29 DIAGNOSIS — E1159 Type 2 diabetes mellitus with other circulatory complications: Secondary | ICD-10-CM

## 2018-11-29 DIAGNOSIS — E785 Hyperlipidemia, unspecified: Secondary | ICD-10-CM

## 2018-11-29 DIAGNOSIS — I2510 Atherosclerotic heart disease of native coronary artery without angina pectoris: Secondary | ICD-10-CM | POA: Diagnosis not present

## 2018-11-29 DIAGNOSIS — K219 Gastro-esophageal reflux disease without esophagitis: Secondary | ICD-10-CM | POA: Diagnosis not present

## 2018-11-29 DIAGNOSIS — E669 Obesity, unspecified: Secondary | ICD-10-CM

## 2018-11-29 DIAGNOSIS — Z79899 Other long term (current) drug therapy: Secondary | ICD-10-CM

## 2018-11-29 DIAGNOSIS — E781 Pure hyperglyceridemia: Secondary | ICD-10-CM

## 2018-11-29 DIAGNOSIS — Z9861 Coronary angioplasty status: Secondary | ICD-10-CM

## 2018-11-29 NOTE — Patient Instructions (Addendum)
Goals    . Exercise 5 x per week (15 min per time)    . HEMOGLOBIN A1C < 8.0    . Weight (lb) < 220 lb (99.8 kg)         Go back to checking blood sugar twice a day - keep log - and bring with you     When you are craving sweets, dry a piece of fruit, or a small square of dark chocolate     Bad carbs also include fruit juice, alcohol, and sweet tea. These are empty calories that do not signal to your brain that you are full.   Please remember the good carbs are still carbs which convert into sugar. So please measure them out no more than 1/2-1 cup of rice, oatmeal, pasta, and beans  Veggies are however free foods! Pile them on.   Not all fruit is created equal. Please see the list below, the fruit at the bottom is higher in sugars than the fruit at the top. Please avoid all dried fruits.        Carbohydrate Counting for Diabetes Mellitus, Adult  Carbohydrate counting is a method of keeping track of how many carbohydrates you eat. Eating carbohydrates naturally increases the amount of sugar (glucose) in the blood. Counting how many carbohydrates you eat helps keep your blood glucose within normal limits, which helps you manage your diabetes (diabetes mellitus). It is important to know how many carbohydrates you can safely have in each meal. This is different for every person. A diet and nutrition specialist (registered dietitian) can help you make a meal plan and calculate how many carbohydrates you should have at each meal and snack. Carbohydrates are found in the following foods:  Grains, such as breads and cereals.  Dried beans and soy products.  Starchy vegetables, such as potatoes, peas, and corn.  Fruit and fruit juices.  Milk and yogurt.  Sweets and snack foods, such as cake, cookies, candy, chips, and soft drinks. How do I count carbohydrates? There are two ways to count carbohydrates in food. You can use either of the methods or a combination of both. Reading  "Nutrition Facts" on packaged food The "Nutrition Facts" list is included on the labels of almost all packaged foods and beverages in the [place] It includes:  The serving size.  Information about nutrients in each serving, including the grams (g) of carbohydrate per serving. To use the "Nutrition Facts":  Decide how many servings you will have.  Multiply the number of servings by the number of carbohydrates per serving.  The resulting number is the total amount of carbohydrates that you will be having. Learning standard serving sizes of other foods When you eat carbohydrate foods that are not packaged or do not include "Nutrition Facts" on the label, you need to measure the servings in order to count the amount of carbohydrates:  Measure the foods that you will eat with a food scale or measuring cup, if needed.  Decide how many standard-size servings you will eat.  Multiply the number of servings by 15. Most carbohydrate-rich foods have about 15 g of carbohydrates per serving. ? For example, if you eat 8 oz (170 g) of strawberries, you will have eaten 2 servings and 30 g of carbohydrates (2 servings x 15 g = 30 g).  For foods that have more than one food mixed, such as soups and casseroles, you must count the carbohydrates in each food that is included. The following list  contains standard serving sizes of common carbohydrate-rich foods. Each of these servings has about 15 g of carbohydrates:   hamburger bun or  English muffin.   oz (15 mL) syrup.   oz (14 g) jelly.  1 slice of bread.  1 six-inch tortilla.  3 oz (85 g) cooked rice or pasta.  4 oz (113 g) cooked dried beans.  4 oz (113 g) starchy vegetable, such as peas, corn, or potatoes.  4 oz (113 g) hot cereal.  4 oz (113 g) mashed potatoes or  of a large baked potato.  4 oz (113 g) canned or frozen fruit.  4 oz (120 mL) fruit juice.  4-6 crackers.  6 chicken nuggets.  6 oz (170 g) unsweetened dry cereal.   6 oz (170 g) plain fat-free yogurt or yogurt sweetened with artificial sweeteners.  8 oz (240 mL) milk.  8 oz (170 g) fresh fruit or one small piece of fruit.  24 oz (680 g) popped popcorn. Example of carbohydrate counting Sample meal  3 oz (85 g) chicken breast.  6 oz (170 g) brown rice.  4 oz (113 g) corn.  8 oz (240 mL) milk.  8 oz (170 g) strawberries with sugar-free whipped topping. Carbohydrate calculation 1. Identify the foods that contain carbohydrates: ? Rice. ? Corn. ? Milk. ? Strawberries. 2. Calculate how many servings you have of each food: ? 2 servings rice. ? 1 serving corn. ? 1 serving milk. ? 1 serving strawberries. 3. Multiply each number of servings by 15 g: ? 2 servings rice x 15 g = 30 g. ? 1 serving corn x 15 g = 15 g. ? 1 serving milk x 15 g = 15 g. ? 1 serving strawberries x 15 g = 15 g. 4. Add together all of the amounts to find the total grams of carbohydrates eaten: ? 30 g + 15 g + 15 g + 15 g = 75 g of carbohydrates total. Summary  Carbohydrate counting is a method of keeping track of how many carbohydrates you eat.  Eating carbohydrates naturally increases the amount of sugar (glucose) in the blood.  Counting how many carbohydrates you eat helps keep your blood glucose within normal limits, which helps you manage your diabetes.  A diet and nutrition specialist (registered dietitian) can help you make a meal plan and calculate how many carbohydrates you should have at each meal and snack. This information is not intended to replace advice given to you by your health care provider. Make sure you discuss any questions you have with your health care provider. Document Released: 08/31/2005 Document Revised: 03/10/2017 Document Reviewed: 02/12/2016 Elsevier Interactive Patient Education  2019 [employer].

## 2018-11-30 ENCOUNTER — Encounter: Payer: Self-pay | Admitting: Adult Health

## 2018-11-30 DIAGNOSIS — K760 Fatty (change of) liver, not elsewhere classified: Secondary | ICD-10-CM | POA: Insufficient documentation

## 2018-11-30 LAB — COMPLETE METABOLIC PANEL WITH GFR
AG Ratio: 2.1 (calc) (ref 1.0–2.5)
ALBUMIN MSPROF: 4.6 g/dL (ref 3.6–5.1)
ALKALINE PHOSPHATASE (APISO): 59 U/L (ref 35–144)
ALT: 63 U/L — ABNORMAL HIGH (ref 9–46)
AST: 55 U/L — ABNORMAL HIGH (ref 10–35)
BUN: 10 mg/dL (ref 7–25)
CO2: 21 mmol/L (ref 20–32)
CREATININE: 1.02 mg/dL (ref 0.70–1.18)
Calcium: 10 mg/dL (ref 8.6–10.3)
Chloride: 107 mmol/L (ref 98–110)
GFR, Est African American: 85 mL/min/{1.73_m2} (ref >=60)
GFR, Est Non African American: 74 mL/min/{1.73_m2} (ref >=60)
Globulin: 2.2 g/dL (calc) (ref 1.9–3.7)
Glucose, Bld: 166 mg/dL — ABNORMAL HIGH (ref 65–99)
Potassium: 4.5 mmol/L (ref 3.5–5.3)
SODIUM: 139 mmol/L (ref 135–146)
Total Bilirubin: 0.5 mg/dL (ref 0.2–1.2)
Total Protein: 6.8 g/dL (ref 6.1–8.1)

## 2018-11-30 LAB — LIPID PANEL
Cholesterol: 97 mg/dL (ref <200)
HDL: 36 mg/dL — ABNORMAL LOW (ref >=40)
LDL Cholesterol (Calc): 35 mg/dL (calc)
Non-HDL Cholesterol (Calc): 61 mg/dL (calc) (ref <130)
Total CHOL/HDL Ratio: 2.7 (calc) (ref <5.0)
Triglycerides: 190 mg/dL — ABNORMAL HIGH (ref <150)

## 2018-11-30 LAB — TEST AUTHORIZATION

## 2018-11-30 LAB — CBC WITH DIFFERENTIAL/PLATELET
Absolute Monocytes: 610 cells/uL (ref 200–950)
Basophils Absolute: 30 cells/uL (ref 0–200)
Basophils Relative: 0.6 %
Eosinophils Absolute: 150 cells/uL (ref 15–500)
Eosinophils Relative: 3 %
HCT: 40.6 % (ref 38.5–50.0)
Hemoglobin: 13.4 g/dL (ref 13.2–17.1)
Lymphs Abs: 950 cells/uL (ref 850–3900)
MCH: 29.8 pg (ref 27.0–33.0)
MCHC: 33 g/dL (ref 32.0–36.0)
MCV: 90.4 fL (ref 80.0–100.0)
MPV: 11.9 fL (ref 7.5–12.5)
Monocytes Relative: 12.2 %
NEUTROS PCT: 65.2 %
Neutro Abs: 3260 cells/uL (ref 1500–7800)
PLATELETS: 204 10*3/uL (ref 140–400)
RBC: 4.49 10*6/uL (ref 4.20–5.80)
RDW: 13.5 % (ref 11.0–15.0)
TOTAL LYMPHOCYTE: 19 %
WBC: 5 10*3/uL (ref 3.8–10.8)

## 2018-11-30 LAB — MAGNESIUM: Magnesium: 2 mg/dL (ref 1.5–2.5)

## 2018-11-30 LAB — GAMMA GT: GGT: 43 U/L (ref 3–70)

## 2018-11-30 LAB — TSH: TSH: 0.86 mIU/L (ref 0.40–4.50)

## 2018-11-30 MED ORDER — METOPROLOL SUCCINATE ER 25 MG PO TB24: ORAL_TABLET | ORAL | 0 refills | Status: DC

## 2018-11-30 NOTE — Addendum Note (Signed)
Addended by: Jones Broom on: 11/30/2018 08:55 AM   Modules accepted: Orders

## 2018-12-02 ENCOUNTER — Ambulatory Visit: Payer: Self-pay | Admitting: Physician Assistant

## 2018-12-27 DIAGNOSIS — E230 Hypopituitarism: Secondary | ICD-10-CM | POA: Diagnosis not present

## 2019-01-23 NOTE — Progress Notes (Deleted)
Assessment and Plan:  There are no diagnoses linked to this encounter.    Further disposition pending results of labs. Discussed med's effects and SE's.   Over 30 minutes of exam, counseling, chart review, and critical decision making was performed.   Future Appointments  Date Time Provider [place]  01/24/2019  1:00 PM Liane Comber, NP [facility] None  03/07/2019 10:00 AM [name], MD [facility] None  06/14/2019 11:15 AM Liane Comber, NP [facility] None    ------------------------------------------------------------------------------------------------------------------   HPI 73 y.o.male with T2DM, CAD, presents for  Pain in legs, woozy  Past Medical History:  Diagnosis Date  . Arthritis   . CAD (coronary artery disease)    a.s/p DES to mid LAD and OM2 06/2012.  Marland Kitchen Chronic back pain   . Chronically dry eyes   . Diabetes mellitus without complication ([place])    TYPE 2   . Elevated PSA    being monitored  . GERD (gastroesophageal reflux disease)   . Hypertension   . Hypertriglyceridemia   . Prostate cancer ([place])    RADTIATION      No Known Allergies  Current Outpatient Medications on File Prior to Visit  Medication Sig  . aspirin EC 81 MG tablet Take 81 mg by mouth daily.  Marland Kitchen atorvastatin (LIPITOR) 20 MG tablet TAKE 1 TABLET BY MOUTH  DAILY (Patient taking differently: Take 20 mg by mouth daily. )  . Blood Glucose Monitoring Suppl (ONE TOUCH ULTRA SYSTEM KIT) w/Device KIT Check blood sugar 1 time daily-DX-E11.22  . buPROPion (WELLBUTRIN XL) 300 MG 24 hr tablet Take 1 tablet every morning (Patient taking differently: Take 300 mg by mouth every morning. )  . cholecalciferol (VITAMIN D) 25 MCG (1000 UT) tablet Take 2,000 Units by mouth daily.   . furosemide (LASIX) 40 MG tablet Take 1 tablet 2 x/ day as needed  for fluid retention (Patient taking differently: Take 40 mg by mouth 2 (two) times daily as needed for fluid. Take 1 tablet 2 x/ day as needed   for fluid retention)  . gabapentin (NEURONTIN) 800 MG tablet TAKE 1 TABLET BY MOUTH 3 TO 4 TIMES DAILY FOR PAIN, HOT FLASHES &amp; SWEATS (Patient taking differently: Take 1,600 mg by mouth 2 (two) times daily. TAKE 1 TABLET BY MOUTH 3 TO 4 TIMES DAILY FOR PAIN, HOT FLASHES &amp; SWEATS)  . glucose blood (ONE TOUCH ULTRA TEST) test strip USE WITH METER TO CHECK  BLOOD SUGAR 2 TIMES DAILY  . hydrocortisone (ANUSOL-HC) 25 MG suppository Place 1 suppository (25 mg total) rectally daily as needed for hemorrhoids.  . insulin NPH-regular Human (70-30) 100 UNIT/ML injection Inject 110-120 Units into the skin See admin instructions. Inject 120 units subcutaneously in the morning and 110 units in the afternoon  . Magnesium 400 MG CAPS Take 400 mg by mouth daily.   . megestrol (MEGACE) 20 MG tablet Take 20 mg by mouth 2 (two) times daily.   . metFORMIN (GLUCOPHAGE-XR) 500 MG 24 hr tablet TAKE 1 TO 2 TABLETS BY  MOUTH TWO TIMES DAILY AS  DIRECTED FOR DIABETES (Patient taking differently: Take 1,000 mg by mouth 2 (two) times daily with a meal. )  . metoprolol succinate (TOPROL-XL) 25 MG 24 hr tablet TAKE ONE HALF TABLET BY  MOUTH DAILY, TAKE WITH OR  IMMEDIATELY FOLLOWING A  MEAL. Please make annual appt with Dr Marlou Porch for future refills  . montelukast (SINGULAIR) 10 MG tablet TAKE 1 TABLET BY MOUTH AT  BEDTIME (Patient taking differently:  Take 10 mg by mouth at bedtime. )  . Multiple Vitamin (MULTIVITAMIN WITH MINERALS) TABS tablet Take 1 tablet by mouth daily.  . nitroGLYCERIN (NITROSTAT) 0.4 MG SL tablet PLACE 1 TABLET UNDER THE TONGUE EVERY 5 MINUTES AS NEEDED FOR CHEST PAIN (Patient taking differently: Place 0.4 mg under the tongue every 5 (five) minutes as needed for chest pain. PLACE 1 TABLET UNDER THE TONGUE EVERY 5 MINUTES AS NEEDED FOR CHEST PAIN)  . pantoprazole (PROTONIX) 40 MG tablet TAKE 1 TABLET BY MOUTH  DAILY (Patient taking differently: Take 40 mg by mouth daily. TAKE 1 TABLET BY MOUTH DAILY)  .  tamsulosin (FLOMAX) 0.4 MG CAPS capsule Take 0.4 mg by mouth at bedtime.  Marland Kitchen tiZANidine (ZANAFLEX) 4 MG tablet Take 1 tablet (4 mg total) by mouth 2 (two) times daily as needed for muscle spasms. (Patient not taking: Reported on 11/18/2018)   No current facility-administered medications on file prior to visit.     ROS: all negative except above.   Physical Exam:  There were no vitals taken for this visit.  General Appearance: Well nourished, in no apparent distress. Eyes: PERRLA, EOMs, conjunctiva no swelling or erythema Sinuses: No Frontal/maxillary tenderness ENT/Mouth: Ext aud canals clear, TMs without erythema, bulging. No erythema, swelling, or exudate on post pharynx.  Tonsils not swollen or erythematous. Hearing normal.  Neck: Supple, thyroid normal.  Respiratory: Respiratory effort normal, BS equal bilaterally without rales, rhonchi, wheezing or stridor.  Cardio: RRR with no MRGs. Brisk peripheral pulses without edema.  Abdomen: Soft, + BS.  Non tender, no guarding, rebound, hernias, masses. Lymphatics: Non tender without lymphadenopathy.  Musculoskeletal: Full ROM, 5/5 strength, normal gait.  Skin: Warm, dry without rashes, lesions, ecchymosis.  Neuro: Cranial nerves intact. Normal muscle tone, no cerebellar symptoms. Sensation intact.  Psych: Awake and oriented X 3, normal affect, Insight and Judgment appropriate.     Izora Ribas, NP 11:37 AM [facility]

## 2019-01-24 ENCOUNTER — Ambulatory Visit: Payer: Medicare Other | Admitting: Adult Health

## 2019-01-24 ENCOUNTER — Ambulatory Visit (INDEPENDENT_AMBULATORY_CARE_PROVIDER_SITE_OTHER): Payer: Medicare Other | Admitting: Adult Health

## 2019-01-24 ENCOUNTER — Other Ambulatory Visit: Payer: Self-pay

## 2019-01-24 ENCOUNTER — Encounter: Payer: Self-pay | Admitting: Adult Health

## 2019-01-24 VITALS — BP 130/76 | HR 97 | Temp 96.9°F | Ht 69.0 in | Wt 237.0 lb

## 2019-01-24 DIAGNOSIS — E538 Deficiency of other specified B group vitamins: Secondary | ICD-10-CM

## 2019-01-24 DIAGNOSIS — R5381 Other malaise: Secondary | ICD-10-CM | POA: Diagnosis not present

## 2019-01-24 DIAGNOSIS — R5383 Other fatigue: Secondary | ICD-10-CM

## 2019-01-24 NOTE — Progress Notes (Signed)
Assessment and Plan:  Michael Guzman was seen today for leg pain and fatigue.  Diagnoses and all orders for this visit:  Malaise and fatigue Check labs to r/o B12 def, new anemia, thyroid abnormalities, significant changes in renal/liver functions exam unremarkable/consistent with his baseline; no signs of acute cardiac changes; no red flags From history my impression is this may be related to previously severe persistent hyperglycemia being consistently 200-300+, to recent 100-150 may be contributing to fatigue + baseline decompensation  Will have him keep a closer glucose log and insulin record and follow up in 1-2 weeks Call with any new/different symptoms Consider follow up with cardiology if persistent due to ongoing exertional dyspnea Consider pelvic CT to follow up prostate cancer if persistent or follow up with urology 50+ pack year smoking hx, normal CXR 11/2018 but has never had low dose CT screening - consider if persistent -     CBC with Differential/Platelet -     COMPLETE METABOLIC PANEL WITH GFR -     TSH -     Vitamin B12 -     Urinalysis w microscopic + reflex cultur  Further disposition pending results of labs. Discussed med's effects and SE's.   Over 15 minutes of exam, counseling, chart review, and critical decision making was performed.   Future Appointments  Date Time Provider [place]  03/07/2019 10:00 AM [name], MD [facility] None  06/14/2019 11:15 AM Liane Comber, NP [facility] None    ------------------------------------------------------------------------------------------------------------------   HPI BP 130/76   Pulse 97   Temp (!) 96.9 F (36.1 C)   Ht 5' 9"  (1.753 m)   Wt 237 lb (107.5 kg)   SpO2 98%   BMI 35.00 kg/m   72 y.o.male a former smoker with hx of poorly controlled T2DM, ASCAP s/p PTCA, fatty liver, thyroiditis, prostate cancer (followed by Dr. Junious Silk, underwent lupron now with active monitoring, most recent PSA in  01/2018 resolved to <0.1) presents for discussion of ongoing bilateral hip/leg pain and persistent fatigue/malaise, "wooziness" reportedly ongoing for the past 2-3 months. He reports 6 days out of 7 he wakes up feeling very tired, no motivation to get up, spends most of the day in his recliner dozing, but then frustrated that he can't sleep at night.   He is pending hip arthroplasty by Dr. Berenice Primas, was scheduled earlier this year but was postponed due to A1C being too high, then due to age risks and covid 61. He admits he hasn't followed up with orthopedist regarding pain and encouraged him to do so.   Re: fatigue, he denies chest pain, new dyspnea (he does have some exertional dyspnea consistent with baseline, has been attributed to decompensation due to extremely sedate lifestyle), LE edema, PND, orthopnea, headaches, dizziness, wheezing, cough, fever/chills, vision changes, difficulty focusing, unintentional weight loss, rashes, new/unusual arthralgias/myalgias, n/v/d, abd pain, unusual bleeding, melena/hematochezia, urinary changes.   He cannot correlate fatigue with anything but does report he has been working aggressively on improving diet to improve A1C, previous fasting, preprandial glucose have been 200-300+ consistently, recently ranging 100-150. He denies any episodes of hypoglycemia, shaking, tremors.   BMI is Body mass index is 35 kg/m., he has been working on diet, admits to no exercise due to hip pain.  Wt Readings from Last 3 Encounters:  01/24/19 237 lb (107.5 kg)  11/29/18 229 lb (103.9 kg)  11/02/18 230 lb (104.3 kg)   His blood pressure has been controlled at home, today their BP is BP: 130/76  He does not workout. He denies chest pain, dizziness.   He has been working on diet for T2DM (on metformin 1000 mg BID, he has required very high doses of insulin due to historically very poor compliance with diet, taking 120 units AM, 110 units PM), and denies foot ulcerations,  hyperglycemia, hypoglycemia , increased appetite, nausea, paresthesia of the feet, polydipsia, polyuria, visual disturbances, vomiting and weight loss. Last A1C in the office was:  Lab Results  Component Value Date   HGBA1C 9.0 (H) 11/21/2018    Lab Results  Component Value Date   WBC 5.0 11/29/2018   HGB 13.4 11/29/2018   HCT 40.6 11/29/2018   MCV 90.4 11/29/2018   PLT 204 11/29/2018   Lab Results  Component Value Date   TSH 0.86 11/29/2018   Lab Results  Component Value Date   [id number] 09/05/2015   He is on wellbutrin 300 mg daily for mood/depression; he doesn't believe he is depressed, reports mainly frustrated due to limited mobility and pain, not being able to get out and do the things he'd like to do.    Lab Results  Component Value Date   PSA <0.1 02/08/2018   PSA 6.25 (H) 09/05/2015   PSA 5.12 (H) 08/15/2014   He is UTD on colonoscopy; last in 02/2010 by Dr. Cristina Gong, due follow up next year.     Past Medical History:  Diagnosis Date  . Arthritis   . CAD (coronary artery disease)    a.s/p DES to mid LAD and OM2 06/2012.  Marland Kitchen Chronic back pain   . Chronically dry eyes   . Diabetes mellitus without complication ([place])    TYPE 2   . Elevated PSA    being monitored  . GERD (gastroesophageal reflux disease)   . Hypertension   . Hypertriglyceridemia   . Prostate cancer ([place])    RADTIATION      No Known Allergies  Current Outpatient Medications on File Prior to Visit  Medication Sig  . aspirin EC 81 MG tablet Take 81 mg by mouth daily.  Marland Kitchen atorvastatin (LIPITOR) 20 MG tablet TAKE 1 TABLET BY MOUTH  DAILY (Patient taking differently: Take 20 mg by mouth daily. )  . Blood Glucose Monitoring Suppl (ONE TOUCH ULTRA SYSTEM KIT) w/Device KIT Check blood sugar 1 time daily-[id number]  . buPROPion (WELLBUTRIN XL) 300 MG 24 hr tablet Take 1 tablet every morning (Patient taking differently: Take 300 mg by mouth every morning. )  . cholecalciferol (VITAMIN D) 25 MCG  (1000 UT) tablet Take 10,000 Units by mouth daily.   . furosemide (LASIX) 40 MG tablet Take 1 tablet 2 x/ day as needed  for fluid retention (Patient taking differently: Take 40 mg by mouth 2 (two) times daily as needed for fluid. Take 1 tablet 2 x/ day as needed  for fluid retention)  . gabapentin (NEURONTIN) 800 MG tablet TAKE 1 TABLET BY MOUTH 3 TO 4 TIMES DAILY FOR PAIN, HOT FLASHES &amp; SWEATS (Patient taking differently: Take 1,600 mg by mouth 2 (two) times daily. TAKE 1 TABLET BY MOUTH 3 TO 4 TIMES DAILY FOR PAIN, HOT FLASHES &amp; SWEATS)  . glucose blood (ONE TOUCH ULTRA TEST) test strip USE WITH METER TO CHECK  BLOOD SUGAR 2 TIMES DAILY  . hydrocortisone (ANUSOL-HC) 25 MG suppository Place 1 suppository (25 mg total) rectally daily as needed for hemorrhoids.  . insulin NPH-regular Human (70-30) 100 UNIT/ML injection Inject 110-120 Units into the skin See admin instructions. Inject 120  units subcutaneously in the morning and 110 units in the afternoon  . Magnesium 400 MG CAPS Take 400 mg by mouth daily.   . metFORMIN (GLUCOPHAGE-XR) 500 MG 24 hr tablet TAKE 1 TO 2 TABLETS BY  MOUTH TWO TIMES DAILY AS  DIRECTED FOR DIABETES (Patient taking differently: Take 1,000 mg by mouth 2 (two) times daily with a meal. )  . metoprolol succinate (TOPROL-XL) 25 MG 24 hr tablet TAKE ONE HALF TABLET BY  MOUTH DAILY, TAKE WITH OR  IMMEDIATELY FOLLOWING A  MEAL. Please make annual appt with Dr Marlou Porch for future refills  . montelukast (SINGULAIR) 10 MG tablet TAKE 1 TABLET BY MOUTH AT  BEDTIME (Patient taking differently: Take 10 mg by mouth at bedtime. )  . Multiple Vitamin (MULTIVITAMIN WITH MINERALS) TABS tablet Take 1 tablet by mouth daily.  . nitroGLYCERIN (NITROSTAT) 0.4 MG SL tablet PLACE 1 TABLET UNDER THE TONGUE EVERY 5 MINUTES AS NEEDED FOR CHEST PAIN (Patient taking differently: Place 0.4 mg under the tongue every 5 (five) minutes as needed for chest pain. PLACE 1 TABLET UNDER THE TONGUE EVERY 5 MINUTES  AS NEEDED FOR CHEST PAIN)  . pantoprazole (PROTONIX) 40 MG tablet TAKE 1 TABLET BY MOUTH  DAILY (Patient taking differently: Take 40 mg by mouth daily. TAKE 1 TABLET BY MOUTH DAILY)  . tamsulosin (FLOMAX) 0.4 MG CAPS capsule Take 0.4 mg by mouth at bedtime.  . megestrol (MEGACE) 20 MG tablet Take 20 mg by mouth 2 (two) times daily.   Marland Kitchen tiZANidine (ZANAFLEX) 4 MG tablet Take 1 tablet (4 mg total) by mouth 2 (two) times daily as needed for muscle spasms. (Patient not taking: Reported on 01/24/2019)   No current facility-administered medications on file prior to visit.     ROS: Review of Systems  Constitutional: Positive for malaise/fatigue. Negative for chills, fever and weight loss.  HENT: Negative for congestion, hearing loss, sore throat and tinnitus.   Eyes: Negative for blurred vision and double vision.  Respiratory: Positive for shortness of breath (exertional, consistent with baseline). Negative for cough, sputum production and wheezing.   Cardiovascular: Negative for chest pain, palpitations, orthopnea, claudication and leg swelling.  Gastrointestinal: Negative for abdominal pain, blood in stool, constipation, diarrhea, heartburn, melena, nausea and vomiting.  Genitourinary: Negative.   Musculoskeletal: Positive for joint pain (bilateral hips). Negative for falls and myalgias.  Skin: Negative for rash.  Neurological: Negative for dizziness, tingling, tremors, sensory change, weakness and headaches.  Endo/Heme/Allergies: Negative for polydipsia. Does not bruise/bleed easily.  Psychiatric/Behavioral: Negative for depression, memory loss, substance abuse and suicidal ideas. The patient has insomnia. The patient is not nervous/anxious.   All other systems reviewed and are negative.    Physical Exam:  BP 130/76   Pulse 97   Temp (!) 96.9 F (36.1 C)   Ht '5\' 9"'$  (1.753 m)   Wt 237 lb (107.5 kg)   SpO2 98%   BMI 35.00 kg/m   General Appearance: Well nourished, obese male in no  apparent distress. Eyes: PERRLA, EOMs, conjunctiva no swelling or erythema Sinuses: No Frontal/maxillary tenderness ENT/Mouth: Ext aud canals clear, TMs without erythema, bulging. No erythema, swelling, or exudate on post pharynx.  Tonsils not swollen or erythematous. Hearing normal.  Neck: Supple, thyroid normal.  Respiratory: Respiratory effort normal, BS equal bilaterally without rales, rhonchi, wheezing or stridor.  Cardio: RRR with no MRGs. Brisk peripheral pulses without edema.  Abdomen: Soft, + BS.  Non tender, no guarding, rebound, hernias, masses. Lymphatics: Non tender  without lymphadenopathy.  Musculoskeletal: Symmetrical strength, slow antalgic gait.  Skin: Warm, dry without rashes, lesions, ecchymosis.  Neuro: Cranial nerves intact. Normal muscle tone, no cerebellar symptoms. Sensation intact.  Psych: Awake and oriented X 3, depressed affect, Insight and Judgment appropriate.      Izora Ribas, NP 1:59 PM [facility]

## 2019-01-24 NOTE — Patient Instructions (Signed)
Please follow up with ortho Dr. Berenice Primas for hip/leg/shoulder pain  Please keep a close log of your blood sugars, how much insulin you are taking and drop off the log or call in report in 1-2 weeks  Call back with anything else new or different   Fatigue If you have fatigue, you feel tired all the time and have a lack of energy or a lack of motivation. Fatigue may make it difficult to start or complete tasks because of exhaustion. In general, occasional or mild fatigue is often a normal response to activity or life. However, long-lasting (chronic) or extreme fatigue may be a symptom of a medical condition. Follow these instructions at home: General instructions  Watch your fatigue for any changes.  Go to bed and get up at the same time every day.  Avoid fatigue by pacing yourself during the day and getting enough sleep at night.  Maintain a healthy weight. Medicines  Take over-the-counter and prescription medicines only as told by your health care provider.  Take a multivitamin, if told by your health care provider.  Do not use herbal or dietary supplements unless they are approved by your health care provider. Activity   Exercise regularly, as told by your health care provider.  Use or practice techniques to help you relax, such as yoga, tai chi, meditation, or massage therapy. Eating and drinking   Avoid heavy meals in the evening.  Eat a well-balanced diet, which includes lean proteins, whole grains, plenty of fruits and vegetables, and low-fat dairy products.  Avoid consuming too much caffeine.  Avoid the use of alcohol.  Drink enough fluid to keep your urine pale yellow. Lifestyle  Change situations that cause you stress. Try to keep your work and personal schedule in balance.  Do not use any products that contain nicotine or tobacco, such as cigarettes and e-cigarettes. If you need help quitting, ask your health care provider.  Do not use drugs. Contact a health  care provider if:  Your fatigue does not get better.  You have a fever.  You suddenly lose or gain weight.  You have headaches.  You have trouble falling asleep or sleeping through the night.  You feel angry, guilty, anxious, or sad.  You are unable to have a bowel movement (constipation).  Your skin is dry.  You have swelling in your legs or another part of your body. Get help right away if:  You feel confused.  Your vision is blurry.  You feel faint or you pass out.  You have a severe headache.  You have severe pain in your abdomen, your back, or the area between your waist and hips (pelvis).  You have chest pain, shortness of breath, or an irregular or fast heartbeat.  You are unable to urinate, or you urinate less than normal.  You have abnormal bleeding, such as bleeding from the rectum, vagina, nose, lungs, or nipples.  You vomit blood.  You have thoughts about hurting yourself or others. If you ever feel like you may hurt yourself or others, or have thoughts about taking your own life, get help right away. You can go to your nearest emergency department or call:  Your local emergency services (911 in the [place]  A suicide crisis helpline, such as the [place] at 321-236-9089. This is open 24 hours a day. Summary  If you have fatigue, you feel tired all the time and have a lack of energy or a lack of motivation.  Fatigue may make it difficult to start or complete tasks because of exhaustion.  Long-lasting (chronic) or extreme fatigue may be a symptom of a medical condition.  Exercise regularly, as told by your health care provider.  Change situations that cause you stress. Try to keep your work and personal schedule in balance. This information is not intended to replace advice given to you by your health care provider. Make sure you discuss any questions you have with your health care provider. Document Released: 06/28/2007  Document Revised: 05/26/2017 Document Reviewed: 05/26/2017 Elsevier Interactive Patient Education  [employer].

## 2019-01-25 ENCOUNTER — Other Ambulatory Visit: Payer: Self-pay | Admitting: Adult Health

## 2019-01-25 DIAGNOSIS — R5383 Other fatigue: Secondary | ICD-10-CM

## 2019-01-25 DIAGNOSIS — R7989 Other specified abnormal findings of blood chemistry: Secondary | ICD-10-CM

## 2019-01-25 LAB — COMPLETE METABOLIC PANEL WITH GFR
AG Ratio: 2 (calc) (ref 1.0–2.5)
ALT: 83 U/L — ABNORMAL HIGH (ref 9–46)
AST: 71 U/L — ABNORMAL HIGH (ref 10–35)
Albumin: 4.3 g/dL (ref 3.6–5.1)
Alkaline phosphatase (APISO): 67 U/L (ref 35–144)
BUN: 11 mg/dL (ref 7–25)
CO2: 25 mmol/L (ref 20–32)
Calcium: 10 mg/dL (ref 8.6–10.3)
Chloride: 107 mmol/L (ref 98–110)
Creat: 1.16 mg/dL (ref 0.70–1.18)
GFR, Est African American: 73 mL/min/{1.73_m2} (ref >=60)
GFR, Est Non African American: 63 mL/min/{1.73_m2} (ref >=60)
Globulin: 2.2 g/dL (calc) (ref 1.9–3.7)
Glucose, Bld: 174 mg/dL — ABNORMAL HIGH (ref 65–99)
Potassium: 4.5 mmol/L (ref 3.5–5.3)
Sodium: 142 mmol/L (ref 135–146)
Total Bilirubin: 0.4 mg/dL (ref 0.2–1.2)
Total Protein: 6.5 g/dL (ref 6.1–8.1)

## 2019-01-25 LAB — CBC WITH DIFFERENTIAL/PLATELET
Absolute Monocytes: 787 cells/uL (ref 200–950)
Basophils Absolute: 53 cells/uL (ref 0–200)
Basophils Relative: 1.1 %
Eosinophils Absolute: 202 cells/uL (ref 15–500)
Eosinophils Relative: 4.2 %
HCT: 39.2 % (ref 38.5–50.0)
Hemoglobin: 13.4 g/dL (ref 13.2–17.1)
Lymphs Abs: 979 cells/uL (ref 850–3900)
MCH: 30.8 pg (ref 27.0–33.0)
MCHC: 34.2 g/dL (ref 32.0–36.0)
MCV: 90.1 fL (ref 80.0–100.0)
MPV: 11.4 fL (ref 7.5–12.5)
Monocytes Relative: 16.4 %
Neutro Abs: 2779 cells/uL (ref 1500–7800)
Neutrophils Relative %: 57.9 %
Platelets: 212 10*3/uL (ref 140–400)
RBC: 4.35 10*6/uL (ref 4.20–5.80)
RDW: 12.9 % (ref 11.0–15.0)
Total Lymphocyte: 20.4 %
WBC: 4.8 10*3/uL (ref 3.8–10.8)

## 2019-01-25 LAB — URINALYSIS W MICROSCOPIC + REFLEX CULTURE
Bacteria, UA: NONE SEEN /HPF
Bilirubin Urine: NEGATIVE
Glucose, UA: NEGATIVE
Hgb urine dipstick: NEGATIVE
Hyaline Cast: NONE SEEN /LPF
Ketones, ur: NEGATIVE
Leukocyte Esterase: NEGATIVE
Nitrites, Initial: NEGATIVE
Protein, ur: NEGATIVE
RBC / HPF: NONE SEEN /HPF (ref 0–2)
Specific Gravity, Urine: 1.026 (ref 1.001–1.03)
Squamous Epithelial / HPF: NONE SEEN /HPF (ref <=5)
WBC, UA: NONE SEEN /HPF (ref 0–5)
pH: <=5 (ref 5.0–8.0)

## 2019-01-25 LAB — TSH: TSH: 0.81 mIU/L (ref 0.40–4.50)

## 2019-01-25 LAB — VITAMIN B12: Vitamin B-12: 647 pg/mL (ref 200–1100)

## 2019-01-25 LAB — NO CULTURE INDICATED

## 2019-01-30 ENCOUNTER — Other Ambulatory Visit: Payer: Self-pay | Admitting: Adult Health

## 2019-01-30 ENCOUNTER — Other Ambulatory Visit: Payer: Self-pay | Admitting: Internal Medicine

## 2019-01-30 ENCOUNTER — Other Ambulatory Visit: Payer: Self-pay | Admitting: Cardiology

## 2019-01-30 DIAGNOSIS — F339 Major depressive disorder, recurrent, unspecified: Secondary | ICD-10-CM

## 2019-02-02 ENCOUNTER — Encounter: Payer: Self-pay | Admitting: Adult Health

## 2019-02-02 ENCOUNTER — Ambulatory Visit
Admission: RE | Admit: 2019-02-02 | Discharge: 2019-02-02 | Disposition: A | Discharge status: Home / Self Care | Payer: Medicare Other | Source: Ambulatory Visit | Attending: Adult Health | Admitting: Adult Health

## 2019-02-02 DIAGNOSIS — Z87891 Personal history of nicotine dependence: Secondary | ICD-10-CM | POA: Insufficient documentation

## 2019-02-02 DIAGNOSIS — R7989 Other specified abnormal findings of blood chemistry: Secondary | ICD-10-CM

## 2019-02-02 DIAGNOSIS — R5383 Other fatigue: Secondary | ICD-10-CM

## 2019-02-02 DIAGNOSIS — M67912 Unspecified disorder of synovium and tendon, left shoulder: Secondary | ICD-10-CM | POA: Diagnosis not present

## 2019-02-02 DIAGNOSIS — M67911 Unspecified disorder of synovium and tendon, right shoulder: Secondary | ICD-10-CM | POA: Diagnosis not present

## 2019-02-02 DIAGNOSIS — R945 Abnormal results of liver function studies: Secondary | ICD-10-CM | POA: Diagnosis not present

## 2019-02-16 ENCOUNTER — Other Ambulatory Visit: Payer: Self-pay | Admitting: Cardiology

## 2019-02-17 MED ORDER — METOPROLOL SUCCINATE ER 25 MG PO TB24: ORAL_TABLET | ORAL | 0 refills | Status: DC

## 2019-02-17 NOTE — Addendum Note (Signed)
Addended by: Jones Broom on: 02/17/2019 08:51 AM   Modules accepted: Orders

## 2019-03-07 ENCOUNTER — Other Ambulatory Visit: Payer: Self-pay

## 2019-03-07 ENCOUNTER — Ambulatory Visit (INDEPENDENT_AMBULATORY_CARE_PROVIDER_SITE_OTHER): Payer: Medicare Other | Admitting: Internal Medicine

## 2019-03-07 ENCOUNTER — Encounter: Payer: Self-pay | Admitting: Internal Medicine

## 2019-03-07 VITALS — BP 128/82 | HR 80 | Temp 97.5°F | Resp 18 | Ht 69.0 in | Wt 226.4 lb

## 2019-03-07 DIAGNOSIS — Z136 Encounter for screening for cardiovascular disorders: Secondary | ICD-10-CM | POA: Diagnosis not present

## 2019-03-07 DIAGNOSIS — E559 Vitamin D deficiency, unspecified: Secondary | ICD-10-CM | POA: Diagnosis not present

## 2019-03-07 DIAGNOSIS — K219 Gastro-esophageal reflux disease without esophagitis: Secondary | ICD-10-CM

## 2019-03-07 DIAGNOSIS — Z125 Encounter for screening for malignant neoplasm of prostate: Secondary | ICD-10-CM

## 2019-03-07 DIAGNOSIS — E1142 Type 2 diabetes mellitus with diabetic polyneuropathy: Secondary | ICD-10-CM | POA: Diagnosis not present

## 2019-03-07 DIAGNOSIS — E782 Mixed hyperlipidemia: Secondary | ICD-10-CM

## 2019-03-07 DIAGNOSIS — Z9861 Coronary angioplasty status: Secondary | ICD-10-CM

## 2019-03-07 DIAGNOSIS — Z1211 Encounter for screening for malignant neoplasm of colon: Secondary | ICD-10-CM

## 2019-03-07 DIAGNOSIS — Z0001 Encounter for general adult medical examination with abnormal findings: Secondary | ICD-10-CM

## 2019-03-07 DIAGNOSIS — Z87891 Personal history of nicotine dependence: Secondary | ICD-10-CM | POA: Diagnosis not present

## 2019-03-07 DIAGNOSIS — I10 Essential (primary) hypertension: Secondary | ICD-10-CM | POA: Diagnosis not present

## 2019-03-07 DIAGNOSIS — Z8249 Family history of ischemic heart disease and other diseases of the circulatory system: Secondary | ICD-10-CM

## 2019-03-07 DIAGNOSIS — Z0000 Encounter for general adult medical examination without abnormal findings: Secondary | ICD-10-CM

## 2019-03-07 DIAGNOSIS — Z8546 Personal history of malignant neoplasm of prostate: Secondary | ICD-10-CM

## 2019-03-07 DIAGNOSIS — E1122 Type 2 diabetes mellitus with diabetic chronic kidney disease: Secondary | ICD-10-CM | POA: Diagnosis not present

## 2019-03-07 DIAGNOSIS — Z79899 Other long term (current) drug therapy: Secondary | ICD-10-CM

## 2019-03-07 DIAGNOSIS — I2510 Atherosclerotic heart disease of native coronary artery without angina pectoris: Secondary | ICD-10-CM

## 2019-03-07 DIAGNOSIS — I700 Atherosclerosis of aorta: Secondary | ICD-10-CM

## 2019-03-07 NOTE — Patient Instructions (Signed)

## 2019-03-07 NOTE — Progress Notes (Addendum)
[facility]  [name], M.D.        Uvaldo Bristle. Silverio Lay, P.A.-C         Liane Comber, [place], [place] [address] Telephone [phone] Telefax [phone] Annual  Screening/Preventative Visit  & Comprehensive Evaluation & Examination     This very nice 73 y.o.male presents for a Screening /Preventative Visit & comprehensive evaluation and management of multiple medical co-morbidities.  Patient has been followed for HTN, HLD, T2_NIDDM  and Vitamin D Deficiency. Patient was treated for Prostate Ca (2017) with XRT and then Lupron injections.      Patient is c/o limiting Rt hip pains and surgery is on hold until he improves his Diabetic control with A1c's less than 8.0%. He takes little ownership or has little insight WRT his Gluttonous Overeating which is delaying improvement in his A1c's to allow him to progress to surgery.     HTN predates circa 1991. Patient's BP has been controlled at home.  Today's BP is at goal - 128/82. In 2013, he underwent PCAwit a Stent planted. Patient denies any cardiac symptoms as chest pain, palpitations, shortness of breath, dizziness or ankle swelling.     Patient's hyperlipidemia is controlled with diet and medications. Patient denies myalgias or other medication SE's. Last lipids were at goal albeit sl elevated Trig's: Lab Results  Component Value Date   CHOL 97 11/29/2018   HDL 36 (L) 11/29/2018   LDLCALC 35 11/29/2018   LDLDIRECT 45.7 10/30/2013   TRIG 190 (H) 11/29/2018   CHOLHDL 2.7 11/29/2018      Patient has Moderate Obesity (BMI 33+) and hx/o T2_NIDDM circa 1998 initially on insulin and then started on Insulin in May 2016. He currently takes BID with Novolin 70/30 split or bid dose and patient denies reactive hypoglycemic symptoms, visual blurring, diabetic polys or paresthesias.  He alleges recent FBG's are ranging  betw 90-115 mg%. Last A1c was not at goal: Lab Results  Component Value Date   HGBA1C 9.0 (H) 11/21/2018       Finally, patient has history of Vitamin D Deficiency  ("15" / 2008)  and last vitamin D was  Lab Results  Component Value Date   VD25OH 76 08/30/2018   Current Outpatient Medications on File Prior to Visit  Medication Sig  . aspirin EC 81 MG tablet Take 81 mg by mouth daily.  Marland Kitchen atorvastatin (LIPITOR) 20 MG tablet TAKE 1 TABLET BY MOUTH  DAILY  . Blood Glucose Monitoring Suppl (ONE TOUCH ULTRA SYSTEM KIT) w/Device KIT Check blood sugar 1 time daily-[id number]  . buPROPion (WELLBUTRIN XL) 300 MG 24 hr tablet TAKE 1 TABLET BY MOUTH  EVERY MORNING  . cholecalciferol (VITAMIN D) 25 MCG (1000 UT) tablet Take 10,000 Units by mouth daily.   . furosemide (LASIX) 40 MG tablet Take 1 tablet 2 x/ day as needed  for fluid retention (Patient taking differently: Take 40 mg by mouth 2 (two) times daily as needed for fluid. Take 1 tablet 2 x/ day as needed  for fluid retention)  . gabapentin (NEURONTIN) 800 MG tablet TAKE 1 TABLET BY MOUTH 3 TO 4 TIMES DAILY FOR PAIN, HOT FLASHES & SWEATS (Patient taking differently: Take 1,600 mg by mouth 2 (two) times daily. TAKE 1 TABLET  BY MOUTH 3 TO 4 TIMES DAILY FOR PAIN, HOT FLASHES & SWEATS)  . glucose blood (ONE TOUCH ULTRA TEST) test strip USE WITH METER TO CHECK  BLOOD SUGAR 2 TIMES DAILY  . hydrocortisone (ANUSOL-HC) 25 MG suppository Place 1 suppository (25 mg total) rectally daily as needed for hemorrhoids.  . insulin NPH-regular Human (70-30) 100 UNIT/ML injection Inject 110-120 Units into the skin See admin instructions. Inject 120 units subcutaneously in the morning and 110 units in the afternoon  . Magnesium 400 MG CAPS Take 400 mg by mouth daily.   . metFORMIN (GLUCOPHAGE-XR) 500 MG 24 hr tablet TAKE 1 TO 2 TABLETS BY  MOUTH TWO TIMES DAILY AS  DIRECTED FOR DIABETES (Patient taking differently: Take 1,000 mg by mouth 2 (two) times daily with a meal.  )  . metoprolol succinate (TOPROL-XL) 25 MG 24 hr tablet TAKE ONE-HALF TABLET BY  MOUTH DAILY WITH OR  IMMEDIATELY FOLLOWING A  MEAL.  . montelukast (SINGULAIR) 10 MG tablet TAKE 1 TABLET BY MOUTH AT  BEDTIME  . Multiple Vitamin (MULTIVITAMIN WITH MINERALS) TABS tablet Take 1 tablet by mouth daily.  . nitroGLYCERIN (NITROSTAT) 0.4 MG SL tablet PLACE 1 TABLET UNDER THE TONGUE EVERY 5 MINUTES AS NEEDED FOR CHEST PAIN (Patient taking differently: Place 0.4 mg under the tongue every 5 (five) minutes as needed for chest pain. PLACE 1 TABLET UNDER THE TONGUE EVERY 5 MINUTES AS NEEDED FOR CHEST PAIN)  . pantoprazole (PROTONIX) 40 MG tablet TAKE 1 TABLET BY MOUTH  DAILY  . tamsulosin (FLOMAX) 0.4 MG CAPS capsule Take 0.4 mg by mouth at bedtime.   No current facility-administered medications on file prior to visit.    No Known Allergies   Past Medical History:  Diagnosis Date  . Arthritis   . CAD (coronary artery disease)    a.s/p DES to mid LAD and OM2 06/2012.  Marland Kitchen Chronic back pain   . Chronically dry eyes   . Diabetes mellitus without complication ([place])    TYPE 2   . Elevated PSA    being monitored  . GERD (gastroesophageal reflux disease)   . Hypertension   . Hypertriglyceridemia   . Prostate cancer [facility])    RADTIATION    Health Maintenance  Topic Date Due  . INFLUENZA VACCINE  04/15/2019  . OPHTHALMOLOGY EXAM  04/22/2019  . HEMOGLOBIN A1C  05/24/2019  . URINE MICROALBUMIN  06/01/2019  . COLONOSCOPY  02/13/2020  . FOOT EXAM  03/06/2020  . TETANUS/TDAP  11/27/2023  . Hepatitis C Screening  Completed  . PNA vac Low Risk Adult  Completed   Immunization History  Administered Date(s) Administered  . Influenza Split 06/26/2012  . Influenza, High Dose Seasonal PF 06/20/2015, 06/23/2016, 06/14/2017  . Influenza-Unspecified 06/14/2013, 06/25/2014, 06/03/2018  . Pneumococcal Conjugate-13 04/23/2016  . Pneumococcal Polysaccharide-23 06/14/2017  . Pneumococcal-Unspecified 06/16/2010  .  Td 09/17/2003  . Tdap 11/26/2013  . Zoster 07/12/2012   Last Colon - 02/12/2010- Dr Cristina Gong - recc 10 yr f/u due June 2021.  Past Surgical History:  Procedure Laterality Date  . APPENDECTOMY    . CARDIAC SURGERY     3 stent placed.  . COLONOSCOPY  02/12/2010   Buccini  . LEFT HEART CATHETERIZATION WITH CORONARY ANGIOGRAM N/A 06/27/2012   Procedure: LEFT HEART CATHETERIZATION WITH CORONARY ANGIOGRAM;  Surgeon: Candee Furbish, MD;  Location: [facility] C[facility] LAB;  Service: Cardiovascular;  Laterality: N/A;  . LEFT HEART CATHETERIZATION WITH CORONARY ANGIOGRAM N/A 07/25/2012   Procedure: LEFT HEART  CATHETERIZATION WITH CORONARY ANGIOGRAM;  Surgeon: Sueanne Margarita, MD;  Location: [facility] CATH LAB;  Service: Cardiovascular;  Laterality: N/A;  . PROSTATE BIOPSY    . RADIOLOGY WITH ANESTHESIA N/A 12/03/2015   Procedure: MRI LUMBAR SPINE;  Surgeon: Medication Radiologist, MD;  Location: [facility];  Service: Radiology;  Laterality: N/A;   Family History  Problem Relation Age of Onset  . Diabetes Sister    Social History   Socioeconomic History  . Marital status: Married    Spouse name: Olin Hauser - & is a Dialysis patient  . Number of children: 4  . Years of education: 2 yr college  Occupational History  . Occupation: SUPERVISOR    Employer: [employer] - retired   Tobacco Use  . Smoking status: Former Smoker    Years: 50.00    Quit date: 07/27/2011    Years since quitting: 7.6  . Smokeless tobacco: Never Used  Substance and Sexual Activity  . Alcohol use: Not Currently  . Drug use: No  . Sexual activity: Not on file    ROS Constitutional: Denies fever, chills, weight loss/gain, headaches, insomnia,  night sweats or change in appetite. Does c/o fatigue. Eyes: Denies redness, blurred vision, diplopia, discharge, itchy or watery eyes.  ENT: Denies discharge, congestion, post nasal drip, epistaxis, sore throat, earache, hearing loss, dental pain, Tinnitus, Vertigo, Sinus pain or snoring.   Cardio: Denies chest pain, palpitations, irregular heartbeat, syncope, dyspnea, diaphoresis, orthopnea, PND, claudication or edema Respiratory: denies cough, dyspnea, DOE, pleurisy, hoarseness, laryngitis or wheezing.  Gastrointestinal: Denies dysphagia, heartburn, reflux, water brash, pain, cramps, nausea, vomiting, bloating, diarrhea, constipation, hematemesis, melena, hematochezia, jaundice or hemorrhoids Genitourinary: Denies dysuria, frequency, urgency, nocturia, hesitancy, discharge, hematuria or flank pain. Has urgency, nocturia x 2-3 & occasional hesitancy. Musculoskeletal: Denies arthralgia, myalgia, stiffness, Jt. Swelling, pain, limp or strain/sprain. Denies Falls. Skin: Denies puritis, rash, hives, warts, acne, eczema or change in skin lesion Neuro: No weakness, tremor, incoordination, spasms, paresthesia or pain Psychiatric: Denies confusion, memory loss or sensory loss. Denies Depression. Endocrine: Denies change in weight, skin, hair change, nocturia, and paresthesia, diabetic polys, visual blurring or hyper / hypoglycemic episodes.  Heme/Lymph: No excessive bleeding, bruising or enlarged lymph nodes.  Physical Exam  BP 128/82   Pulse 80   Temp (!) 97.5 F (36.4 C)   Resp 18   Ht 5' 9"  (1.753 m)   Wt 226 lb 6.4 oz (102.7 kg)   BMI 33.43 kg/m   General Appearance: Over nourished and well groomed and in no apparent distress.  Eyes: PERRLA, EOMs, conjunctiva no swelling or erythema, normal fundi and vessels. Sinuses: No frontal/maxillary tenderness ENT/Mouth: EACs patent / TMs  nl. Nares clear without erythema, swelling, mucoid exudates. Oral hygiene is good. No erythema, swelling, or exudate. Tongue normal, non-obstructing. Tonsils not swollen or erythematous. Hearing normal.  Neck: Supple, thyroid not palpable. No bruits, nodes or JVD. Respiratory: Respiratory effort normal.  BS equal and clear bilateral without rales, rhonci, wheezing or stridor. Cardio: Heart sounds  are normal with regular rate and rhythm and no murmurs, rubs or gallops. Peripheral pulses are normal and equal bilaterally without edema. No aortic or femoral bruits. Chest: symmetric with normal excursions and percussion.  Abdomen: Soft, with Nl bowel sounds. Nontender, no guarding, rebound, hernias, masses, or organomegaly.  Lymphatics: Non tender without lymphadenopathy.  Musculoskeletal: Decreased ROM, Flex/ext & rotation of the R hip.  Limping gait with a cane. Skin: Warm and dry without rashes, lesions, cyanosis, clubbing or  ecchymosis.  Neuro: Cranial nerves intact, reflexes equal bilaterally. Normal muscle tone, no cerebellar symptoms. Sensation sl decreased to touch, vibratory and Monofilament to the toes bilaterally. Pysch: Alert and oriented X 3 with normal affect, insight and judgment appropriate.   Assessment and Plan  1. Annual Preventative/Screening Exam   2. Essential hypertension  - EKG 12-Lead - [place], RETROPERITNL ABD,  LTD - Urinalysis, Routine w reflex microscopic - Microalbumin / creatinine urine ratio - CBC with Differential/Platelet - COMPLETE METABOLIC PANEL WITH GFR - Magnesium - TSH  3. Hyperlipidemia, mixed  - EKG 12-Lead - [place], RETROPERITNL ABD,  LTD - Lipid panel - TSH  4. Type 2 diabetes mellitus with stage 2 chronic kidney disease, with long-term current use of insulin (HCC)  - EKG 12-Lead - [place], RETROPERITNL ABD,  LTD - HM DIABETES FOOT EXAM - LOW EXTREMITY NEUR EXAM DOCUM - COMPLETE METABOLIC PANEL WITH GFR - Hemoglobin A1c  5. Vitamin D deficiency  - Urinalysis, Routine w reflex microscopic - Microalbumin / creatinine urine ratio - VITAMIN D 25 Hydroxy (Vit-D Deficiency, Fractures)  6. Diabetic peripheral neuropathy (HCC)  - Hemoglobin A1c  7. ASCAD s/p PTCA (06/2012)  - EKG 12-Lead  8. Gastroesophageal reflux disease  - CBC with Differential/Platelet  9. Personal history of prostate cancer  - PSA  10. Prostate cancer  screening  - PSA  11. Screening for ischemic heart disease  - EKG 12-Lead  12. FHx: heart disease  - EKG 12-Lead - [place], RETROPERITNL ABD,  LTD  13. Former smoker  - EKG 12-Lead - [place], RETROPERITNL ABD,  LTD  14. Aortic atherosclerosis (HCC)  - [place], RETROPERITNL ABD,  LTD  15. Screening for AAA (aortic abdominal aneurysm)  - [place], RETROPERITNL ABD,  LTD  16. Medication management  - Urinalysis, Routine w reflex microscopic - Microalbumin / creatinine urine ratio - Uric acid - CBC with Differential/Platelet - COMPLETE METABOLIC PANEL WITH GFR - Magnesium - Lipid panel - TSH - Hemoglobin A1c - VITAMIN D 25 Hydroxyl  17. Screening for colorectal cancer  - POC Hemoccult Bld/Stl       Patient was counseled in prudent diet, weight control to achieve/maintain BMI less than 25, BP monitoring, regular exercise and medications as discussed.  Discussed med effects and SE's. Routine screening labs and tests as requested with regular follow-up as recommended. Over 40 minutes of exam, counseling, chart review and high complex critical decision making was performed   Kirtland Bouchard, MD

## 2019-03-08 LAB — COMPLETE METABOLIC PANEL WITH GFR
AG Ratio: 1.8 (calc) (ref 1.0–2.5)
ALT: 65 U/L — ABNORMAL HIGH (ref 9–46)
AST: 65 U/L — ABNORMAL HIGH (ref 10–35)
Albumin: 4.2 g/dL (ref 3.6–5.1)
Alkaline phosphatase (APISO): 56 U/L (ref 35–144)
BUN: 9 mg/dL (ref 7–25)
CO2: 27 mmol/L (ref 20–32)
Calcium: 9.6 mg/dL (ref 8.6–10.3)
Chloride: 103 mmol/L (ref 98–110)
Creat: 0.92 mg/dL (ref 0.70–1.18)
GFR, Est African American: 97 mL/min/{1.73_m2} (ref >=60)
GFR, Est Non African American: 83 mL/min/{1.73_m2} (ref >=60)
Globulin: 2.3 g/dL (calc) (ref 1.9–3.7)
Glucose, Bld: 145 mg/dL — ABNORMAL HIGH (ref 65–99)
Potassium: 4.4 mmol/L (ref 3.5–5.3)
Sodium: 140 mmol/L (ref 135–146)
Total Bilirubin: 0.4 mg/dL (ref 0.2–1.2)
Total Protein: 6.5 g/dL (ref 6.1–8.1)

## 2019-03-08 LAB — CBC WITH DIFFERENTIAL/PLATELET
Absolute Monocytes: 556 cells/uL (ref 200–950)
Basophils Absolute: 40 cells/uL (ref 0–200)
Basophils Relative: 1 %
Eosinophils Absolute: 120 cells/uL (ref 15–500)
Eosinophils Relative: 3 %
HCT: 37.5 % — ABNORMAL LOW (ref 38.5–50.0)
Hemoglobin: 12.7 g/dL — ABNORMAL LOW (ref 13.2–17.1)
Lymphs Abs: 900 cells/uL (ref 850–3900)
MCH: 30.5 pg (ref 27.0–33.0)
MCHC: 33.9 g/dL (ref 32.0–36.0)
MCV: 89.9 fL (ref 80.0–100.0)
MPV: 11.2 fL (ref 7.5–12.5)
Monocytes Relative: 13.9 %
Neutro Abs: 2384 cells/uL (ref 1500–7800)
Neutrophils Relative %: 59.6 %
Platelets: 194 10*3/uL (ref 140–400)
RBC: 4.17 10*6/uL — ABNORMAL LOW (ref 4.20–5.80)
RDW: 12.9 % (ref 11.0–15.0)
Total Lymphocyte: 22.5 %
WBC: 4 10*3/uL (ref 3.8–10.8)

## 2019-03-08 LAB — LIPID PANEL
Cholesterol: 99 mg/dL (ref <200)
HDL: 33 mg/dL — ABNORMAL LOW (ref >=40)
LDL Cholesterol (Calc): 40 mg/dL (calc)
Non-HDL Cholesterol (Calc): 66 mg/dL (calc) (ref <130)
Total CHOL/HDL Ratio: 3 (calc) (ref <5.0)
Triglycerides: 184 mg/dL — ABNORMAL HIGH (ref <150)

## 2019-03-08 LAB — URINALYSIS, ROUTINE W REFLEX MICROSCOPIC
Bilirubin Urine: NEGATIVE
Glucose, UA: NEGATIVE
Hgb urine dipstick: NEGATIVE
Ketones, ur: NEGATIVE
Leukocytes,Ua: NEGATIVE
Nitrite: NEGATIVE
Protein, ur: NEGATIVE
Specific Gravity, Urine: 1.015 (ref 1.001–1.03)
pH: <=5 (ref 5.0–8.0)

## 2019-03-08 LAB — MICROALBUMIN / CREATININE URINE RATIO
Creatinine, Urine: 94 mg/dL (ref 20–320)
Microalb Creat Ratio: 5 mcg/mg creat (ref <30)
Microalb, Ur: 0.5 mg/dL

## 2019-03-08 LAB — MAGNESIUM: Magnesium: 1.7 mg/dL (ref 1.5–2.5)

## 2019-03-08 LAB — PSA: PSA: <0.1 ng/mL (ref <=4.0)

## 2019-03-08 LAB — URIC ACID: Uric Acid, Serum: 4.7 mg/dL (ref 4.0–8.0)

## 2019-03-08 LAB — VITAMIN D 25 HYDROXY (VIT D DEFICIENCY, FRACTURES): Vit D, 25-Hydroxy: 93 ng/mL (ref 30–100)

## 2019-03-08 LAB — HEMOGLOBIN A1C
Hgb A1c MFr Bld: 7.1 % of total Hgb — ABNORMAL HIGH (ref <5.7)
Mean Plasma Glucose: 157 (calc)
eAG (mmol/L): 8.7 (calc)

## 2019-03-08 LAB — TSH: TSH: 0.72 mIU/L (ref 0.40–4.50)

## 2019-04-06 DIAGNOSIS — M1611 Unilateral primary osteoarthritis, right hip: Secondary | ICD-10-CM | POA: Diagnosis not present

## 2019-04-06 DIAGNOSIS — M67911 Unspecified disorder of synovium and tendon, right shoulder: Secondary | ICD-10-CM | POA: Diagnosis not present

## 2019-04-06 DIAGNOSIS — E230 Hypopituitarism: Secondary | ICD-10-CM | POA: Diagnosis not present

## 2019-04-06 DIAGNOSIS — M67912 Unspecified disorder of synovium and tendon, left shoulder: Secondary | ICD-10-CM | POA: Diagnosis not present

## 2019-04-18 ENCOUNTER — Other Ambulatory Visit: Payer: Self-pay | Admitting: Internal Medicine

## 2019-04-18 ENCOUNTER — Other Ambulatory Visit: Payer: Self-pay | Admitting: Cardiology

## 2019-04-18 DIAGNOSIS — M159 Polyosteoarthritis, unspecified: Secondary | ICD-10-CM

## 2019-04-18 DIAGNOSIS — R61 Generalized hyperhidrosis: Secondary | ICD-10-CM

## 2019-04-18 DIAGNOSIS — M8949 Other hypertrophic osteoarthropathy, multiple sites: Secondary | ICD-10-CM

## 2019-04-21 NOTE — Patient Instructions (Addendum)
YOU NEED TO HAVE A COVID 19 TEST ON_____8-11-2020__ @___10 :15am___, THIS TEST MUST BE DONE BEFORE SURGERY, COME  [address] [place] , [address]. ONCE YOUR COVID TEST IS COMPLETED, PLEASE BEGIN THE QUARANTINE INSTRUCTIONS AS OUTLINED IN YOUR HANDOUT.                [name]   Your procedure is scheduled on: 04-28-2019   Report to [place]  Entrance     Report to [place] at 5:30AM   1 VISITOR IS ALLOWED TO WAIT IN WAITING ROOM  ONLY DAY OF YOUR SURGERY.    Call this number if you have problems the morning of surgery [phone]    Remember: Do not eat food or drink liquids :After Midnight. BRUSH YOUR TEETH MORNING OF SURGERY AND RINSE YOUR MOUTH OUT, NO CHEWING GUM CANDY OR MINTS.     Take these medicines the morning of surgery with A SIP OF WATER: Wellbutrin, metoprolol, Protonix, gabapentin, atorvastatin    How to Manage Your Diabetes Before and After Surgery  Why is it important to control my blood sugar before and after surgery? . Improving blood sugar levels before and after surgery helps healing and can limit problems. . A way of improving blood sugar control is eating a healthy diet by: o  Eating less sugar and carbohydrates o  Increasing activity/exercise o  Talking with your doctor about reaching your blood sugar goals . High blood sugars (greater than 180 mg/dL) can raise your risk of infections and slow your recovery, so you will need to focus on controlling your diabetes during the weeks before surgery. . Make sure that the doctor who takes care of your diabetes knows about your planned surgery including the date and location.  How do I manage my blood sugar before surgery? . Check your blood sugar at least 4 times a day, starting 2 days before surgery, to make sure that the level is not too high or low. o Check your blood sugar the morning of your surgery when you wake up and every 2 hours until you get to the Short Stay  unit. . If your blood sugar is less than 70 mg/dL, you will need to treat for low blood sugar: o Do not take insulin. o Treat a low blood sugar (less than 70 mg/dL) with  cup of clear juice (cranberry or apple), 4 glucose tablets, OR glucose gel. o Recheck blood sugar in 15 minutes after treatment (to make sure it is greater than 70 mg/dL). If your blood sugar is not greater than 70 mg/dL on recheck, call [phone] for further instructions. . Report your blood sugar to the short stay nurse when you get to Short Stay.  . If you are admitted to the hospital after surgery: o Your blood sugar will be checked by the staff and you will probably be given insulin after surgery (instead of oral diabetes medicines) to make sure you have good blood sugar levels. o The goal for blood sugar control after surgery is 80-180 mg/dL.   WHAT DO I DO ABOUT MY DIABETES MEDICATION?   . THE DAY BEFORE SURGERY, Take normal dose of Novolin 70/30 insulin      Take metformin as normal      . THE MORNING OF SURGERY, DO NOT TAKE ANY DIABETIC MEDICATIONS.  You may not have any metal on your body including hair pins and              piercings  Do not wear jewelry, make-up, lotions, powders or perfumes, deodorant                       Men may shave face and neck.   Do not bring valuables to the hospital. [place].  Contacts, dentures or bridgework may not be worn into surgery.                Please read over the following fact sheets you were given: _____________________________________________________________________             [facility] - Preparing for Surgery Before surgery, you can play an important role.  Because skin is not sterile, your skin needs to be as free of germs as possible.  You can reduce the number of germs on your skin by washing with CHG (chlorahexidine gluconate) soap before surgery.  CHG is an  antiseptic cleaner which kills germs and bonds with the skin to continue killing germs even after washing. Please DO NOT use if you have an allergy to CHG or antibacterial soaps.  If your skin becomes reddened/irritated stop using the CHG and inform your nurse when you arrive at Short Stay. Do not shave (including legs and underarms) for at least 48 hours prior to the first CHG shower.  You may shave your face/neck. Please follow these instructions carefully:  1.  Shower with CHG Soap the night before surgery and the  morning of Surgery.  2.  If you choose to wash your hair, wash your hair first as usual with your  normal  shampoo.  3.  After you shampoo, rinse your hair and body thoroughly to remove the  shampoo.                           4.  Use CHG as you would any other liquid soap.  You can apply chg directly  to the skin and wash                       Gently with a scrungie or clean washcloth.  5.  Apply the CHG Soap to your body ONLY FROM THE NECK DOWN.   Do not use on face/ open                           Wound or open sores. Avoid contact with eyes, ears mouth and genitals (private parts).                       Wash face,  Genitals (private parts) with your normal soap.             6.  Wash thoroughly, paying special attention to the area where your surgery  will be performed.  7.  Thoroughly rinse your body with warm water from the neck down.  8.  DO NOT shower/wash with your normal soap after using and rinsing off  the CHG Soap.                9.  Pat yourself dry with a clean towel.  10.  Wear clean pajamas.            11.  Place clean sheets on your bed the night of your first shower and do not  sleep with pets. Day of Surgery : Do not apply any lotions/deodorants the morning of surgery.  Please wear clean clothes to the hospital/surgery center.  FAILURE TO FOLLOW THESE INSTRUCTIONS MAY RESULT IN THE CANCELLATION OF YOUR SURGERY PATIENT  SIGNATURE_________________________________  NURSE SIGNATURE__________________________________  ________________________________________________________________________

## 2019-04-21 NOTE — Progress Notes (Signed)
Cardiac clearance 11-04-2018 [facility] epic tele note   hgba1c 6-23 epic   ekg 6-12 epic   Echo 2019 epic   Stress test feb 2020 epic

## 2019-04-21 NOTE — Progress Notes (Signed)
Need orders for surgery . Pre-op appt is 04-24-2019 thanks

## 2019-04-24 ENCOUNTER — Encounter (HOSPITAL_COMMUNITY): Payer: Self-pay

## 2019-04-24 ENCOUNTER — Encounter (HOSPITAL_COMMUNITY)
Admission: RE | Admit: 2019-04-24 | Discharge: 2019-04-24 | Disposition: A | Discharge status: Home / Self Care | Payer: Medicare Other | Source: Ambulatory Visit | Attending: Orthopedic Surgery | Admitting: Orthopedic Surgery

## 2019-04-24 ENCOUNTER — Other Ambulatory Visit: Payer: Self-pay

## 2019-04-24 DIAGNOSIS — I2510 Atherosclerotic heart disease of native coronary artery without angina pectoris: Secondary | ICD-10-CM | POA: Insufficient documentation

## 2019-04-24 DIAGNOSIS — Z8546 Personal history of malignant neoplasm of prostate: Secondary | ICD-10-CM | POA: Insufficient documentation

## 2019-04-24 DIAGNOSIS — Z87891 Personal history of nicotine dependence: Secondary | ICD-10-CM | POA: Diagnosis not present

## 2019-04-24 DIAGNOSIS — Z79899 Other long term (current) drug therapy: Secondary | ICD-10-CM | POA: Insufficient documentation

## 2019-04-24 DIAGNOSIS — Z7982 Long term (current) use of aspirin: Secondary | ICD-10-CM | POA: Insufficient documentation

## 2019-04-24 DIAGNOSIS — K219 Gastro-esophageal reflux disease without esophagitis: Secondary | ICD-10-CM | POA: Diagnosis not present

## 2019-04-24 DIAGNOSIS — Z955 Presence of coronary angioplasty implant and graft: Secondary | ICD-10-CM | POA: Insufficient documentation

## 2019-04-24 DIAGNOSIS — Z923 Personal history of irradiation: Secondary | ICD-10-CM | POA: Diagnosis not present

## 2019-04-24 DIAGNOSIS — E119 Type 2 diabetes mellitus without complications: Secondary | ICD-10-CM | POA: Diagnosis not present

## 2019-04-24 DIAGNOSIS — Z01818 Encounter for other preprocedural examination: Secondary | ICD-10-CM | POA: Diagnosis not present

## 2019-04-24 DIAGNOSIS — Z794 Long term (current) use of insulin: Secondary | ICD-10-CM | POA: Insufficient documentation

## 2019-04-24 DIAGNOSIS — Z20828 Contact with and (suspected) exposure to other viral communicable diseases: Secondary | ICD-10-CM | POA: Diagnosis not present

## 2019-04-24 DIAGNOSIS — M1611 Unilateral primary osteoarthritis, right hip: Secondary | ICD-10-CM | POA: Diagnosis not present

## 2019-04-24 DIAGNOSIS — I10 Essential (primary) hypertension: Secondary | ICD-10-CM | POA: Insufficient documentation

## 2019-04-24 HISTORY — DX: Fatty (change of) liver, not elsewhere classified: K76.0

## 2019-04-24 HISTORY — DX: Personal history of irradiation: Z92.3

## 2019-04-24 LAB — CBC
HCT: 38.7 % — ABNORMAL LOW (ref 39.0–52.0)
Hemoglobin: 12.2 g/dL — ABNORMAL LOW (ref 13.0–17.0)
MCH: 30 pg (ref 26.0–34.0)
MCHC: 31.5 g/dL (ref 30.0–36.0)
MCV: 95.1 fL (ref 80.0–100.0)
Platelets: 145 10*3/uL — ABNORMAL LOW (ref 150–400)
RBC: 4.07 MIL/uL — ABNORMAL LOW (ref 4.22–5.81)
RDW: 13.2 % (ref 11.5–15.5)
WBC: 5.1 10*3/uL (ref 4.0–10.5)
nRBC: 0 % (ref 0.0–0.2)

## 2019-04-24 LAB — GLUCOSE, CAPILLARY
Glucose-Capillary: 102 mg/dL — ABNORMAL HIGH (ref 70–99)
Glucose-Capillary: 45 mg/dL — ABNORMAL LOW (ref 70–99)

## 2019-04-24 LAB — COMPREHENSIVE METABOLIC PANEL
ALT: 60 U/L — ABNORMAL HIGH (ref 0–44)
AST: 46 U/L — ABNORMAL HIGH (ref 15–41)
Albumin: 3.9 g/dL (ref 3.5–5.0)
Alkaline Phosphatase: 80 U/L (ref 38–126)
Anion gap: 8 (ref 5–15)
BUN: 13 mg/dL (ref 8–23)
CO2: 25 mmol/L (ref 22–32)
Calcium: 9.1 mg/dL (ref 8.9–10.3)
Chloride: 104 mmol/L (ref 98–111)
Creatinine, Ser: 0.96 mg/dL (ref 0.61–1.24)
GFR calc Af Amer: >60 mL/min (ref >60)
GFR calc non Af Amer: >60 mL/min (ref >60)
Glucose, Bld: 107 mg/dL — ABNORMAL HIGH (ref 70–99)
Potassium: 3.9 mmol/L (ref 3.5–5.1)
Sodium: 137 mmol/L (ref 135–145)
Total Bilirubin: 0.4 mg/dL (ref 0.3–1.2)
Total Protein: 6.8 g/dL (ref 6.5–8.1)

## 2019-04-24 LAB — SURGICAL PCR SCREEN
MRSA, PCR: NEGATIVE
Staphylococcus aureus: NEGATIVE

## 2019-04-25 ENCOUNTER — Other Ambulatory Visit (HOSPITAL_COMMUNITY)
Admission: RE | Admit: 2019-04-25 | Discharge: 2019-04-25 | Disposition: A | Discharge status: Home / Self Care | Payer: Medicare Other | Source: Ambulatory Visit | Attending: Orthopedic Surgery | Admitting: Orthopedic Surgery

## 2019-04-25 DIAGNOSIS — Z794 Long term (current) use of insulin: Secondary | ICD-10-CM | POA: Diagnosis not present

## 2019-04-25 DIAGNOSIS — Z79899 Other long term (current) drug therapy: Secondary | ICD-10-CM | POA: Diagnosis not present

## 2019-04-25 DIAGNOSIS — H2513 Age-related nuclear cataract, bilateral: Secondary | ICD-10-CM | POA: Diagnosis not present

## 2019-04-25 DIAGNOSIS — Z923 Personal history of irradiation: Secondary | ICD-10-CM | POA: Diagnosis not present

## 2019-04-25 DIAGNOSIS — Z955 Presence of coronary angioplasty implant and graft: Secondary | ICD-10-CM | POA: Diagnosis not present

## 2019-04-25 DIAGNOSIS — H524 Presbyopia: Secondary | ICD-10-CM | POA: Diagnosis not present

## 2019-04-25 DIAGNOSIS — M1611 Unilateral primary osteoarthritis, right hip: Secondary | ICD-10-CM | POA: Diagnosis not present

## 2019-04-25 DIAGNOSIS — Z01818 Encounter for other preprocedural examination: Secondary | ICD-10-CM | POA: Diagnosis not present

## 2019-04-25 DIAGNOSIS — I10 Essential (primary) hypertension: Secondary | ICD-10-CM | POA: Diagnosis not present

## 2019-04-25 DIAGNOSIS — E119 Type 2 diabetes mellitus without complications: Secondary | ICD-10-CM | POA: Diagnosis not present

## 2019-04-25 DIAGNOSIS — H40013 Open angle with borderline findings, low risk, bilateral: Secondary | ICD-10-CM | POA: Diagnosis not present

## 2019-04-25 DIAGNOSIS — Z87891 Personal history of nicotine dependence: Secondary | ICD-10-CM | POA: Diagnosis not present

## 2019-04-25 DIAGNOSIS — Z7982 Long term (current) use of aspirin: Secondary | ICD-10-CM | POA: Diagnosis not present

## 2019-04-25 DIAGNOSIS — K219 Gastro-esophageal reflux disease without esophagitis: Secondary | ICD-10-CM | POA: Diagnosis not present

## 2019-04-25 DIAGNOSIS — I2510 Atherosclerotic heart disease of native coronary artery without angina pectoris: Secondary | ICD-10-CM | POA: Diagnosis not present

## 2019-04-25 LAB — HM DIABETES EYE EXAM

## 2019-04-25 LAB — SARS CORONAVIRUS 2 (TAT 6-24 HRS): SARS Coronavirus 2: NEGATIVE

## 2019-04-26 ENCOUNTER — Other Ambulatory Visit: Payer: Self-pay | Admitting: *Deleted

## 2019-04-26 MED ORDER — ONETOUCH DELICA PLUS LANCET33G MISC: 1 refills | Status: DC

## 2019-04-26 MED ORDER — ONETOUCH ULTRA 2 W/DEVICE KIT: PACK | 0 refills | Status: DC

## 2019-04-26 MED ORDER — ONETOUCH ULTRA VI STRP: ORAL_STRIP | 1 refills | Status: DC

## 2019-04-26 NOTE — Progress Notes (Signed)
Anesthesia Chart Review   Case: [id number] Date/Time: 04/28/19 0700   Procedure: TOTAL HIP ARTHROPLASTY ANTERIOR APPROACH (Right )   Anesthesia type: Spinal   Pre-op diagnosis: RIGHT HIP PAIN WITH DEGENERATIVE JOINT DISEASE   Location: [facility] / [facility]   Surgeon: Michael Leitz, MD      DISCUSSION:73 y.o. former smoker (quit 07/27/11) with h/o HTN, GERD, prostate cancer s/p radiation, CAD (DES to mid LAD and OM2 2013), DM II, right hip pain w/DJD scheduled for above procedure 04/28/2019 with Dr. Dorna Guzman.   Previously postponed due to A1C of 9.0.  Last A1C 7.1 on 03/07/2019.  Cardiac clearance received 11/04/18.  Per Melina Copa, PA-C, "Nuclear stress test was normal and he has not been describing any recent anginal symptoms. Therefore,based on ACC/AHA guidelines,Marcus M Vanhookwould be at acceptable risk for right total hip arthroplastywithout further cardiovascular testing. Per earlier discussion with Dr. Marlou Porch, if the surgeon feels that aspirin must be held for his hip surgery, Dr. Altamese Windom may hold ASA for 5-7 days ifnecessary. We would recommend to resume as soon as felt safe by surgery team."  Anticipate pt can proceed with planned procedure barring acute status change.   VS: BP 139/78   Pulse 62   Resp 16   Ht _0  (1.778 m)   Wt 101.6 kg   SpO2 100%   BMI 32.14 kg/m   PROVIDERS: [name], MD is PCP last seen 03/07/2019  Candee Furbish, MD is Cardiologist  LABS: Labs reviewed: Acceptable for surgery. (all labs ordered are listed, but only abnormal results are displayed)  Labs Reviewed  CBC - Abnormal; Notable for the following components:      Result Value   RBC 4.07 (*)    Hemoglobin 12.2 (*)    HCT 38.7 (*)    Platelets 145 (*)    All other components within normal limits  COMPREHENSIVE METABOLIC PANEL - Abnormal; Notable for the following components:   Glucose, Bld 107 (*)    AST 46 (*)    ALT 60 (*)    All other components within normal  limits  GLUCOSE, CAPILLARY - Abnormal; Notable for the following components:   Glucose-Capillary 45 (*)    All other components within normal limits  GLUCOSE, CAPILLARY - Abnormal; Notable for the following components:   Glucose-Capillary 102 (*)    All other components within normal limits  SURGICAL PCR SCREEN     IMAGES: Chest Xray 11/21/2018 FINDINGS: Cardiac shadows within normal limits. Aortic calcifications are again seen. The lungs are clear bilaterally. Previously seen nodular density on the right is no longer identified and is felt to be artifactual in nature given multiple extrinsic leads. Left basilar scarring is again noted. No bony abnormality is seen.  IMPRESSION: No active cardiopulmonary disease.   EKG: 03/07/2019 Rate 77 bpm NSR  CV: Stress Test 11/02/2018  Nuclear stress EF: 55%.  No T wave inversion was noted during stress.  There was no ST segment deviation noted during stress.  The study is normal.  This is a low risk study.   Normal perfusion. LVEF 55% with normal wall motion. This is a low risk study. Compared with a prior study in 2017, there is no longer ischemia.  Echo 02/02/18 Study Conclusions  - Left ventricle: The cavity size was normal. There was mild   concentric hypertrophy. Systolic function was normal. The   estimated ejection fraction was in the range of 60% to 65%. Wall   motion  was normal; there were no regional wall motion   abnormalities. Doppler parameters are consistent with abnormal   left ventricular relaxation (grade 1 diastolic dysfunction).   There was no evidence of elevated ventricular filling pressure by   Doppler parameters. - Aortic valve: Valve area (VTI): 3.87 cm^2. Valve area (Vmax):   3.61 cm^2. Valve area (Vmean): 3.59 cm^2. - Mitral valve: There was mild regurgitation. Valve area by   pressure half-time: 2.44 cm^2. - Left atrium: The atrium was normal in size. - Right ventricle: Systolic function  was normal. - Tricuspid valve: There was mild regurgitation. - Pulmonary arteries: Systolic pressure was within the normal   range. - Inferior vena cava: The vessel was normal in size. - Pericardium, extracardiac: There was no pericardial effusion. Past Medical History:  Diagnosis Date  . Arthritis   . CAD (coronary artery disease)    a.s/p DES to mid LAD and OM2 06/2012.  Marland Kitchen Chronic back pain   . Chronically dry eyes   . Diabetes mellitus without complication ([place])    TYPE 2   . Elevated PSA    being monitored  . Fatty liver   . GERD (gastroesophageal reflux disease)   . Hx of radiation therapy    prostate , [facility]   . Hypertension   . Hypertriglyceridemia   . Prostate cancer ([place])    RADTIATION     Past Surgical History:  Procedure Laterality Date  . APPENDECTOMY    . CARDIAC SURGERY  ~2017   3 stent placed.  . COLONOSCOPY  02/12/2010   Buccini  . LEFT HEART CATHETERIZATION WITH CORONARY ANGIOGRAM N/A 06/27/2012   Procedure: LEFT HEART CATHETERIZATION WITH CORONARY ANGIOGRAM;  Surgeon: Candee Furbish, MD;  Location: [facility];  Service: Cardiovascular;  Laterality: N/A;  . LEFT HEART CATHETERIZATION WITH CORONARY ANGIOGRAM N/A 07/25/2012   Procedure: LEFT HEART CATHETERIZATION WITH CORONARY ANGIOGRAM;  Surgeon: Sueanne Margarita, MD;  Location: [facility] CATH LAB;  Service: Cardiovascular;  Laterality: N/A;  . PROSTATE BIOPSY    . RADIOLOGY WITH ANESTHESIA N/A 12/03/2015   Procedure: MRI LUMBAR SPINE;  Surgeon: Medication Radiologist, MD;  Location: [place];  Service: Radiology;  Laterality: N/A;    MEDICATIONS: . Ascorbic Acid (VITAMIN C) 100 MG tablet  . aspirin EC 81 MG tablet  . atorvastatin (LIPITOR) 20 MG tablet  . Blood Glucose Monitoring Suppl (ONE TOUCH ULTRA 2) w/Device KIT  . Blood Glucose Monitoring Suppl (ONE TOUCH ULTRA SYSTEM KIT) w/Device KIT  . buPROPion (WELLBUTRIN XL) 300 MG 24 hr tablet  . Cholecalciferol (VITAMIN D3) 125 MCG (5000 UT) CAPS   . furosemide (LASIX) 40 MG tablet  . gabapentin (NEURONTIN) 800 MG tablet  . glucose blood (ONE TOUCH ULTRA TEST) test strip  . glucose blood (ONETOUCH ULTRA) test strip  . hydrocortisone (ANUSOL-HC) 25 MG suppository  . insulin NPH-regular Human (70-30) 100 UNIT/ML injection  . Lancets (ONETOUCH DELICA PLUS [id number]) MISC  . Magnesium 250 MG TABS  . metFORMIN (GLUCOPHAGE-XR) 500 MG 24 hr tablet  . metoprolol succinate (TOPROL-XL) 25 MG 24 hr tablet  . montelukast (SINGULAIR) 10 MG tablet  . Multiple Vitamin (MULTIVITAMIN WITH MINERALS) TABS tablet  . nitroGLYCERIN (NITROSTAT) 0.4 MG SL tablet  . pantoprazole (PROTONIX) 40 MG tablet  . Polyethyl Glycol-Propyl Glycol (SYSTANE) 0.4-0.3 % SOLN  . tamsulosin (FLOMAX) 0.4 MG CAPS capsule  . Testosterone 20.25 MG/1.25GM (1.62%) GEL  . Zinc 30 MG TABS   No current facility-administered medications for this encounter.  Maia Plan [facility] Pre-Surgical Testing [phone] 04/26/19  3:27 PM

## 2019-04-26 NOTE — Anesthesia Preprocedure Evaluation (Addendum)
Anesthesia Evaluation  Patient identified by MRN, date of birth, ID band Patient awake    Reviewed: Allergy & Precautions, H&P , NPO status , Patient's Chart, lab work & pertinent test results  Airway Mallampati: II   Neck ROM: full    Dental   Pulmonary former smoker,    breath sounds clear to auscultation       Cardiovascular hypertension, + CAD, + Cardiac Stents and + Peripheral Vascular Disease   Rhythm:regular Rate:Normal     Neuro/Psych  Neuromuscular disease    GI/Hepatic GERD  ,  Endo/Other  diabetes, Type 2obese  Renal/GU      Musculoskeletal  (+) Arthritis ,   Abdominal   Peds  Hematology   Anesthesia Other Findings   Reproductive/Obstetrics                            Anesthesia Physical Anesthesia Plan  ASA: II  Anesthesia Plan: Spinal   Post-op Pain Management:    Induction: Intravenous  PONV Risk Score and Plan: 1 and Ondansetron, Dexamethasone, Midazolam, Propofol infusion and Treatment may vary due to age or medical condition  Airway Management Planned: Simple Face Mask  Additional Equipment:   Intra-op Plan:   Post-operative Plan:   Informed Consent: I have reviewed the patients History and Physical, chart, labs and discussed the procedure including the risks, benefits and alternatives for the proposed anesthesia with the patient or authorized representative who has indicated his/her understanding and acceptance.       Plan Discussed with: CRNA, Anesthesiologist and Surgeon  Anesthesia Plan Comments: (See PAT note 04/24/2019, Konrad Felix, PA-C)       Anesthesia Quick Evaluation

## 2019-04-27 ENCOUNTER — Other Ambulatory Visit: Payer: Self-pay | Admitting: *Deleted

## 2019-04-27 ENCOUNTER — Other Ambulatory Visit: Payer: Self-pay | Admitting: Orthopedic Surgery

## 2019-04-27 MED ORDER — ONETOUCH DELICA LANCETS 33G MISC: 0 refills | Status: DC

## 2019-04-28 ENCOUNTER — Ambulatory Visit (HOSPITAL_COMMUNITY): Payer: Medicare Other | Admitting: Anesthesiology

## 2019-04-28 ENCOUNTER — Ambulatory Visit (HOSPITAL_COMMUNITY): Payer: Medicare Other

## 2019-04-28 ENCOUNTER — Other Ambulatory Visit: Payer: Self-pay

## 2019-04-28 ENCOUNTER — Encounter (HOSPITAL_COMMUNITY): Payer: Self-pay | Admitting: Emergency Medicine

## 2019-04-28 ENCOUNTER — Inpatient Hospital Stay (HOSPITAL_COMMUNITY)
Admission: RE | Admit: 2019-04-28 | Discharge: 2019-05-01 | DRG: 470 | Disposition: A | Discharge status: Home Health | Payer: Medicare Other | Attending: Orthopedic Surgery | Admitting: Orthopedic Surgery

## 2019-04-28 ENCOUNTER — Encounter (HOSPITAL_COMMUNITY)
Admission: RE | Disposition: A | Discharge status: Home Health | Payer: Self-pay | Source: Home / Self Care | Attending: Orthopedic Surgery

## 2019-04-28 DIAGNOSIS — E119 Type 2 diabetes mellitus without complications: Secondary | ICD-10-CM | POA: Diagnosis not present

## 2019-04-28 DIAGNOSIS — Z833 Family history of diabetes mellitus: Secondary | ICD-10-CM | POA: Diagnosis not present

## 2019-04-28 DIAGNOSIS — E1142 Type 2 diabetes mellitus with diabetic polyneuropathy: Secondary | ICD-10-CM | POA: Diagnosis not present

## 2019-04-28 DIAGNOSIS — I2510 Atherosclerotic heart disease of native coronary artery without angina pectoris: Secondary | ICD-10-CM | POA: Diagnosis present

## 2019-04-28 DIAGNOSIS — Z87891 Personal history of nicotine dependence: Secondary | ICD-10-CM | POA: Diagnosis not present

## 2019-04-28 DIAGNOSIS — E1165 Type 2 diabetes mellitus with hyperglycemia: Secondary | ICD-10-CM | POA: Diagnosis present

## 2019-04-28 DIAGNOSIS — I700 Atherosclerosis of aorta: Secondary | ICD-10-CM | POA: Diagnosis present

## 2019-04-28 DIAGNOSIS — Z7982 Long term (current) use of aspirin: Secondary | ICD-10-CM | POA: Diagnosis not present

## 2019-04-28 DIAGNOSIS — G8929 Other chronic pain: Secondary | ICD-10-CM | POA: Diagnosis not present

## 2019-04-28 DIAGNOSIS — Z794 Long term (current) use of insulin: Secondary | ICD-10-CM | POA: Diagnosis not present

## 2019-04-28 DIAGNOSIS — E781 Pure hyperglyceridemia: Secondary | ICD-10-CM | POA: Diagnosis present

## 2019-04-28 DIAGNOSIS — M1611 Unilateral primary osteoarthritis, right hip: Secondary | ICD-10-CM | POA: Diagnosis not present

## 2019-04-28 DIAGNOSIS — Z923 Personal history of irradiation: Secondary | ICD-10-CM

## 2019-04-28 DIAGNOSIS — K760 Fatty (change of) liver, not elsewhere classified: Secondary | ICD-10-CM | POA: Diagnosis not present

## 2019-04-28 DIAGNOSIS — M549 Dorsalgia, unspecified: Secondary | ICD-10-CM | POA: Diagnosis not present

## 2019-04-28 DIAGNOSIS — I10 Essential (primary) hypertension: Secondary | ICD-10-CM | POA: Diagnosis not present

## 2019-04-28 DIAGNOSIS — E1169 Type 2 diabetes mellitus with other specified complication: Secondary | ICD-10-CM | POA: Diagnosis present

## 2019-04-28 DIAGNOSIS — Z955 Presence of coronary angioplasty implant and graft: Secondary | ICD-10-CM | POA: Diagnosis not present

## 2019-04-28 DIAGNOSIS — M1610 Unilateral primary osteoarthritis, unspecified hip: Secondary | ICD-10-CM | POA: Diagnosis present

## 2019-04-28 DIAGNOSIS — Z96641 Presence of right artificial hip joint: Secondary | ICD-10-CM | POA: Diagnosis not present

## 2019-04-28 DIAGNOSIS — K219 Gastro-esophageal reflux disease without esophagitis: Secondary | ICD-10-CM | POA: Diagnosis not present

## 2019-04-28 DIAGNOSIS — Z419 Encounter for procedure for purposes other than remedying health state, unspecified: Secondary | ICD-10-CM

## 2019-04-28 DIAGNOSIS — E785 Hyperlipidemia, unspecified: Secondary | ICD-10-CM | POA: Diagnosis present

## 2019-04-28 DIAGNOSIS — Z471 Aftercare following joint replacement surgery: Secondary | ICD-10-CM | POA: Diagnosis not present

## 2019-04-28 DIAGNOSIS — Z8546 Personal history of malignant neoplasm of prostate: Secondary | ICD-10-CM

## 2019-04-28 DIAGNOSIS — Z01811 Encounter for preprocedural respiratory examination: Secondary | ICD-10-CM

## 2019-04-28 HISTORY — PX: TOTAL HIP ARTHROPLASTY: SHX124

## 2019-04-28 LAB — CBC WITH DIFFERENTIAL/PLATELET
Abs Immature Granulocytes: 0.03 10*3/uL (ref 0.00–0.07)
Basophils Absolute: 0 10*3/uL (ref 0.0–0.1)
Basophils Relative: 1 %
Eosinophils Absolute: 0.2 10*3/uL (ref 0.0–0.5)
Eosinophils Relative: 4 %
HCT: 38 % — ABNORMAL LOW (ref 39.0–52.0)
Hemoglobin: 12.7 g/dL — ABNORMAL LOW (ref 13.0–17.0)
Immature Granulocytes: 1 %
Lymphocytes Relative: 23 %
Lymphs Abs: 1 10*3/uL (ref 0.7–4.0)
MCH: 30.5 pg (ref 26.0–34.0)
MCHC: 33.4 g/dL (ref 30.0–36.0)
MCV: 91.3 fL (ref 80.0–100.0)
Monocytes Absolute: 0.7 10*3/uL (ref 0.1–1.0)
Monocytes Relative: 15 %
Neutro Abs: 2.5 10*3/uL (ref 1.7–7.7)
Neutrophils Relative %: 56 %
Platelets: 149 10*3/uL — ABNORMAL LOW (ref 150–400)
RBC: 4.16 MIL/uL — ABNORMAL LOW (ref 4.22–5.81)
RDW: 13 % (ref 11.5–15.5)
WBC: 4.4 10*3/uL (ref 4.0–10.5)
nRBC: 0 % (ref 0.0–0.2)

## 2019-04-28 LAB — GLUCOSE, CAPILLARY
Glucose-Capillary: 73 mg/dL (ref 70–99)
Glucose-Capillary: 71 mg/dL (ref 70–99)
Glucose-Capillary: 132 mg/dL — ABNORMAL HIGH (ref 70–99)
Glucose-Capillary: 82 mg/dL (ref 70–99)
Glucose-Capillary: 94 mg/dL (ref 70–99)
Glucose-Capillary: 133 mg/dL — ABNORMAL HIGH (ref 70–99)
Glucose-Capillary: 157 mg/dL — ABNORMAL HIGH (ref 70–99)

## 2019-04-28 LAB — URINALYSIS, ROUTINE W REFLEX MICROSCOPIC
Bilirubin Urine: NEGATIVE
Glucose, UA: NEGATIVE mg/dL
Hgb urine dipstick: NEGATIVE
Ketones, ur: NEGATIVE mg/dL
Leukocytes,Ua: NEGATIVE
Nitrite: NEGATIVE
Protein, ur: NEGATIVE mg/dL
Specific Gravity, Urine: 1.009 (ref 1.005–1.030)
pH: 5 (ref 5.0–8.0)

## 2019-04-28 LAB — PROTIME-INR
INR: 1 (ref 0.8–1.2)
Prothrombin Time: 13.1 seconds (ref 11.4–15.2)

## 2019-04-28 LAB — APTT: aPTT: 32 seconds (ref 24–36)

## 2019-04-28 SURGERY — ARTHROPLASTY, HIP, TOTAL, ANTERIOR APPROACH
Anesthesia: Spinal | Site: Hip | Laterality: Right

## 2019-04-28 MED ORDER — INSULIN ASPART 100 UNIT/ML ~~LOC~~ SOLN
0.0000 [IU] | Freq: Three times a day (TID) | SUBCUTANEOUS | Status: DC
  Administered 2019-04-29 (×3): 3 [IU] via SUBCUTANEOUS
  Administered 2019-04-30: 5 [IU] via SUBCUTANEOUS
  Administered 2019-04-30: 8 [IU] via SUBCUTANEOUS
  Administered 2019-04-30 – 2019-05-01 (×3): 3 [IU] via SUBCUTANEOUS

## 2019-04-28 MED ORDER — ATORVASTATIN CALCIUM 20 MG PO TABS
20.0000 mg | ORAL_TABLET | Freq: Every day | ORAL | Status: DC
  Administered 2019-04-29 – 2019-05-01 (×3): 20 mg via ORAL
  Filled 2019-04-28 (×3): qty 1

## 2019-04-28 MED ORDER — PROPOFOL 500 MG/50ML IV EMUL
INTRAVENOUS | Status: DC | PRN
  Administered 2019-04-28: 50 ug/kg/min via INTRAVENOUS

## 2019-04-28 MED ORDER — LACTATED RINGERS IV SOLN
INTRAVENOUS | Status: DC
  Administered 2019-04-28 (×2): via INTRAVENOUS

## 2019-04-28 MED ORDER — CHLORHEXIDINE GLUCONATE 4 % EX LIQD: 60.0000 mL | Freq: Once | CUTANEOUS | Status: DC

## 2019-04-28 MED ORDER — INSULIN ASPART PROT & ASPART (70-30 MIX) 100 UNIT/ML ~~LOC~~ SUSP
30.0000 [IU] | Freq: Every day | SUBCUTANEOUS | Status: DC
  Administered 2019-04-29 – 2019-05-01 (×3): 30 [IU] via SUBCUTANEOUS
  Filled 2019-04-28: qty 10

## 2019-04-28 MED ORDER — OXYCODONE-ACETAMINOPHEN 5-325 MG PO TABS: 1.0000 | ORAL_TABLET | Freq: Four times a day (QID) | ORAL | 0 refills | Status: DC | PRN

## 2019-04-28 MED ORDER — ACETAMINOPHEN 325 MG PO TABS
325.0000 mg | ORAL_TABLET | Freq: Four times a day (QID) | ORAL | Status: DC | PRN
  Administered 2019-04-29 – 2019-05-01 (×4): 650 mg via ORAL
  Filled 2019-04-28 (×4): qty 2

## 2019-04-28 MED ORDER — MONTELUKAST SODIUM 10 MG PO TABS
10.0000 mg | ORAL_TABLET | Freq: Every day | ORAL | Status: DC
  Administered 2019-04-28 – 2019-04-30 (×3): 10 mg via ORAL
  Filled 2019-04-28 (×3): qty 1

## 2019-04-28 MED ORDER — DEXTROSE 50 % IV SOLN
INTRAVENOUS | Status: AC
  Filled 2019-04-28: qty 50

## 2019-04-28 MED ORDER — GABAPENTIN 300 MG PO CAPS
300.0000 mg | ORAL_CAPSULE | Freq: Two times a day (BID) | ORAL | Status: DC
  Administered 2019-04-28 – 2019-05-01 (×7): 300 mg via ORAL
  Filled 2019-04-28 (×7): qty 1

## 2019-04-28 MED ORDER — ONDANSETRON HCL 4 MG PO TABS: 4.0000 mg | ORAL_TABLET | Freq: Four times a day (QID) | ORAL | Status: DC | PRN

## 2019-04-28 MED ORDER — BUPIVACAINE LIPOSOME 1.3 % IJ SUSP
20.0000 mL | Freq: Once | INTRAMUSCULAR | Status: DC
  Filled 2019-04-28: qty 20

## 2019-04-28 MED ORDER — MAGNESIUM CITRATE PO SOLN: 1.0000 | Freq: Once | ORAL | Status: DC | PRN

## 2019-04-28 MED ORDER — BUPIVACAINE LIPOSOME 1.3 % IJ SUSP
10.0000 mL | Freq: Once | INTRAMUSCULAR | Status: AC
  Administered 2019-04-28: 10 mL
  Filled 2019-04-28: qty 10

## 2019-04-28 MED ORDER — TIZANIDINE HCL 2 MG PO TABS: 2.0000 mg | ORAL_TABLET | Freq: Three times a day (TID) | ORAL | 0 refills | Status: DC | PRN

## 2019-04-28 MED ORDER — ASPIRIN EC 325 MG PO TBEC: 325.0000 mg | DELAYED_RELEASE_TABLET | Freq: Two times a day (BID) | ORAL | 0 refills | Status: DC

## 2019-04-28 MED ORDER — METHOCARBAMOL 500 MG IVPB - SIMPLE MED
500.0000 mg | Freq: Four times a day (QID) | INTRAVENOUS | Status: DC | PRN
  Filled 2019-04-28: qty 50

## 2019-04-28 MED ORDER — CEFAZOLIN SODIUM-DEXTROSE 2-4 GM/100ML-% IV SOLN
2.0000 g | Freq: Four times a day (QID) | INTRAVENOUS | Status: AC
  Administered 2019-04-28 (×2): 2 g via INTRAVENOUS
  Filled 2019-04-28 (×2): qty 100

## 2019-04-28 MED ORDER — NITROGLYCERIN 0.4 MG SL SUBL: 0.4000 mg | SUBLINGUAL_TABLET | SUBLINGUAL | Status: DC | PRN

## 2019-04-28 MED ORDER — ONDANSETRON HCL 4 MG/2ML IJ SOLN
4.0000 mg | Freq: Four times a day (QID) | INTRAMUSCULAR | Status: DC | PRN
  Administered 2019-04-28 – 2019-04-29 (×2): 4 mg via INTRAVENOUS
  Filled 2019-04-28 (×2): qty 2

## 2019-04-28 MED ORDER — BUPIVACAINE-EPINEPHRINE 0.25% -1:200000 IJ SOLN
INTRAMUSCULAR | Status: DC | PRN
  Administered 2019-04-28: 30 mL

## 2019-04-28 MED ORDER — DEXTROSE 50 % IV SOLN
INTRAVENOUS | Status: DC | PRN
  Administered 2019-04-28: 12.5 g via INTRAVENOUS

## 2019-04-28 MED ORDER — INSULIN ASPART 100 UNIT/ML ~~LOC~~ SOLN: 0.0000 [IU] | Freq: Every day | SUBCUTANEOUS | Status: DC

## 2019-04-28 MED ORDER — ASPIRIN EC 325 MG PO TBEC
325.0000 mg | DELAYED_RELEASE_TABLET | Freq: Two times a day (BID) | ORAL | Status: DC
  Administered 2019-04-28 – 2019-05-01 (×6): 325 mg via ORAL
  Filled 2019-04-28 (×6): qty 1

## 2019-04-28 MED ORDER — TRANEXAMIC ACID-NACL 1000-0.7 MG/100ML-% IV SOLN
1000.0000 mg | INTRAVENOUS | Status: AC
  Administered 2019-04-28: 1000 mg via INTRAVENOUS
  Filled 2019-04-28: qty 100

## 2019-04-28 MED ORDER — OXYCODONE HCL 5 MG PO TABS
5.0000 mg | ORAL_TABLET | ORAL | Status: DC | PRN
  Administered 2019-04-28: 5 mg via ORAL
  Filled 2019-04-28: qty 1

## 2019-04-28 MED ORDER — SODIUM CHLORIDE 0.9 % IR SOLN
Status: DC | PRN
  Administered 2019-04-28: 1000 mL

## 2019-04-28 MED ORDER — ONDANSETRON HCL 4 MG/2ML IJ SOLN
INTRAMUSCULAR | Status: DC | PRN
  Administered 2019-04-28: 4 mg via INTRAVENOUS

## 2019-04-28 MED ORDER — CEFAZOLIN SODIUM-DEXTROSE 2-4 GM/100ML-% IV SOLN
2.0000 g | INTRAVENOUS | Status: AC
  Administered 2019-04-28: 2 g via INTRAVENOUS
  Filled 2019-04-28: qty 100

## 2019-04-28 MED ORDER — METHOCARBAMOL 500 MG PO TABS
500.0000 mg | ORAL_TABLET | Freq: Four times a day (QID) | ORAL | Status: DC | PRN
  Administered 2019-04-29: 500 mg via ORAL
  Filled 2019-04-28: qty 1

## 2019-04-28 MED ORDER — MIDAZOLAM HCL 2 MG/2ML IJ SOLN
INTRAMUSCULAR | Status: AC
  Filled 2019-04-28: qty 2

## 2019-04-28 MED ORDER — WATER FOR IRRIGATION, STERILE IR SOLN
Status: DC | PRN
  Administered 2019-04-28: 2000 mL

## 2019-04-28 MED ORDER — ONDANSETRON HCL 4 MG/2ML IJ SOLN: 4.0000 mg | Freq: Four times a day (QID) | INTRAMUSCULAR | Status: DC | PRN

## 2019-04-28 MED ORDER — METFORMIN HCL ER 500 MG PO TB24
1000.0000 mg | ORAL_TABLET | Freq: Two times a day (BID) | ORAL | Status: DC
  Administered 2019-04-28 – 2019-05-01 (×6): 1000 mg via ORAL
  Filled 2019-04-28 (×6): qty 2

## 2019-04-28 MED ORDER — POLYETHYLENE GLYCOL 3350 17 G PO PACK: 17.0000 g | PACK | Freq: Every day | ORAL | Status: DC | PRN

## 2019-04-28 MED ORDER — OXYCODONE HCL 5 MG/5ML PO SOLN: 5.0000 mg | Freq: Once | ORAL | Status: DC | PRN

## 2019-04-28 MED ORDER — ONDANSETRON HCL 4 MG/2ML IJ SOLN
INTRAMUSCULAR | Status: AC
  Filled 2019-04-28: qty 2

## 2019-04-28 MED ORDER — DOCUSATE SODIUM 100 MG PO CAPS: 100.0000 mg | ORAL_CAPSULE | Freq: Two times a day (BID) | ORAL | 0 refills | Status: DC

## 2019-04-28 MED ORDER — POVIDONE-IODINE 10 % EX SWAB
2.0000 "application " | Freq: Once | CUTANEOUS | Status: AC
  Administered 2019-04-28: 2 via TOPICAL

## 2019-04-28 MED ORDER — PROPOFOL 10 MG/ML IV BOLUS
INTRAVENOUS | Status: AC
  Filled 2019-04-28: qty 60

## 2019-04-28 MED ORDER — ALUM & MAG HYDROXIDE-SIMETH 200-200-20 MG/5ML PO SUSP: 30.0000 mL | ORAL | Status: DC | PRN

## 2019-04-28 MED ORDER — BISACODYL 5 MG PO TBEC: 5.0000 mg | DELAYED_RELEASE_TABLET | Freq: Every day | ORAL | Status: DC | PRN

## 2019-04-28 MED ORDER — HYDROMORPHONE HCL 1 MG/ML IJ SOLN
0.5000 mg | INTRAMUSCULAR | Status: DC | PRN
  Administered 2019-04-28 (×2): 1 mg via INTRAVENOUS
  Filled 2019-04-28 (×3): qty 1

## 2019-04-28 MED ORDER — TRANEXAMIC ACID-NACL 1000-0.7 MG/100ML-% IV SOLN
1000.0000 mg | Freq: Once | INTRAVENOUS | Status: AC
  Administered 2019-04-28: 1000 mg via INTRAVENOUS
  Filled 2019-04-28: qty 100

## 2019-04-28 MED ORDER — FENTANYL CITRATE (PF) 100 MCG/2ML IJ SOLN: 25.0000 ug | INTRAMUSCULAR | Status: DC | PRN

## 2019-04-28 MED ORDER — DIPHENHYDRAMINE HCL 12.5 MG/5ML PO ELIX: 12.5000 mg | ORAL_SOLUTION | ORAL | Status: DC | PRN

## 2019-04-28 MED ORDER — MAGNESIUM 250 MG PO TABS: 250.0000 mg | ORAL_TABLET | Freq: Every day | ORAL | Status: DC

## 2019-04-28 MED ORDER — SODIUM CHLORIDE 0.9 % IV SOLN
INTRAVENOUS | Status: DC
  Administered 2019-04-28 – 2019-04-29 (×3): via INTRAVENOUS

## 2019-04-28 MED ORDER — TAMSULOSIN HCL 0.4 MG PO CAPS
0.4000 mg | ORAL_CAPSULE | Freq: Every day | ORAL | Status: DC
  Administered 2019-04-28 – 2019-04-30 (×3): 0.4 mg via ORAL
  Filled 2019-04-28 (×3): qty 1

## 2019-04-28 MED ORDER — PHENYLEPHRINE HCL-NACL 10-0.9 MG/250ML-% IV SOLN
INTRAVENOUS | Status: AC
  Filled 2019-04-28: qty 250

## 2019-04-28 MED ORDER — DEXAMETHASONE SODIUM PHOSPHATE 10 MG/ML IJ SOLN
10.0000 mg | Freq: Two times a day (BID) | INTRAMUSCULAR | Status: AC
  Administered 2019-04-29 (×2): 10 mg via INTRAVENOUS
  Filled 2019-04-28 (×3): qty 1

## 2019-04-28 MED ORDER — DOCUSATE SODIUM 100 MG PO CAPS
100.0000 mg | ORAL_CAPSULE | Freq: Two times a day (BID) | ORAL | Status: DC
  Administered 2019-04-28 – 2019-05-01 (×7): 100 mg via ORAL
  Filled 2019-04-28 (×7): qty 1

## 2019-04-28 MED ORDER — FENTANYL CITRATE (PF) 100 MCG/2ML IJ SOLN
INTRAMUSCULAR | Status: AC
  Filled 2019-04-28: qty 2

## 2019-04-28 MED ORDER — BUPROPION HCL ER (XL) 300 MG PO TB24
300.0000 mg | ORAL_TABLET | Freq: Every day | ORAL | Status: DC
  Administered 2019-04-29 – 2019-05-01 (×3): 300 mg via ORAL
  Filled 2019-04-28 (×3): qty 1

## 2019-04-28 MED ORDER — BUPIVACAINE IN DEXTROSE 0.75-8.25 % IT SOLN
INTRATHECAL | Status: DC | PRN
  Administered 2019-04-28: 2 mL via INTRATHECAL

## 2019-04-28 MED ORDER — FENTANYL CITRATE (PF) 100 MCG/2ML IJ SOLN
INTRAMUSCULAR | Status: DC | PRN
  Administered 2019-04-28: 50 ug via INTRAVENOUS

## 2019-04-28 MED ORDER — MIDAZOLAM HCL 5 MG/5ML IJ SOLN
INTRAMUSCULAR | Status: DC | PRN
  Administered 2019-04-28 (×2): 1 mg via INTRAVENOUS

## 2019-04-28 MED ORDER — BUPIVACAINE-EPINEPHRINE (PF) 0.25% -1:200000 IJ SOLN
INTRAMUSCULAR | Status: AC
  Filled 2019-04-28: qty 30

## 2019-04-28 MED ORDER — PANTOPRAZOLE SODIUM 40 MG PO TBEC
40.0000 mg | DELAYED_RELEASE_TABLET | Freq: Every day | ORAL | Status: DC
  Administered 2019-04-29 – 2019-05-01 (×3): 40 mg via ORAL
  Filled 2019-04-28 (×3): qty 1

## 2019-04-28 MED ORDER — OXYCODONE HCL 5 MG PO TABS: 5.0000 mg | ORAL_TABLET | Freq: Once | ORAL | Status: DC | PRN

## 2019-04-28 MED ORDER — METOPROLOL SUCCINATE ER 25 MG PO TB24
12.5000 mg | ORAL_TABLET | Freq: Every day | ORAL | Status: DC
  Administered 2019-04-29 – 2019-05-01 (×3): 12.5 mg via ORAL
  Filled 2019-04-28 (×3): qty 1

## 2019-04-28 MED ORDER — MAGNESIUM OXIDE 400 (241.3 MG) MG PO TABS
400.0000 mg | ORAL_TABLET | Freq: Every day | ORAL | Status: DC
  Administered 2019-04-28 – 2019-05-01 (×4): 400 mg via ORAL
  Filled 2019-04-28 (×4): qty 1

## 2019-04-28 SURGICAL SUPPLY — 38 items
BAG ZIPLOCK 12X15 (MISCELLANEOUS) IMPLANT
BENZOIN TINCTURE PRP APPL 2/3 (GAUZE/BANDAGES/DRESSINGS) ×1 IMPLANT
BLADE SAW SGTL 18X1.27X75 (BLADE) ×2 IMPLANT
BLADE SURG SZ10 CARB STEEL (BLADE) ×4 IMPLANT
COVER PERINEAL POST (MISCELLANEOUS) ×2 IMPLANT
COVER SURGICAL LIGHT HANDLE (MISCELLANEOUS) ×2 IMPLANT
COVER WAND RF STERILE (DRAPES) ×1 IMPLANT
CUP ACETBLR 54 OD 100 SERIES (Hips) ×1 IMPLANT
DRAPE STERI IOBAN 125X83 (DRAPES) ×2 IMPLANT
DRAPE U-SHAPE 47X51 STRL (DRAPES) ×4 IMPLANT
DRSG AQUACEL AG ADV 3.5X 6 (GAUZE/BANDAGES/DRESSINGS) ×2 IMPLANT
DURAPREP 26ML APPLICATOR (WOUND CARE) ×2 IMPLANT
ELECT BLADE TIP CTD 4 INCH (ELECTRODE) ×2 IMPLANT
ELECT REM PT RETURN 15FT ADLT (MISCELLANEOUS) ×2 IMPLANT
ELIMINATOR HOLE APEX DEPUY (Hips) ×1 IMPLANT
GAUZE XEROFORM 1X8 LF (GAUZE/BANDAGES/DRESSINGS) IMPLANT
GLOVE BIOGEL PI IND STRL 8 (GLOVE) ×2 IMPLANT
GLOVE BIOGEL PI INDICATOR 8 (GLOVE) ×2
GLOVE ECLIPSE 7.5 STRL STRAW (GLOVE) ×4 IMPLANT
GOWN STRL REUS W/TWL XL LVL3 (GOWN DISPOSABLE) ×4 IMPLANT
HEAD M SROM 36MM 2 (Hips) IMPLANT
HOLDER FOLEY CATH W/STRAP (MISCELLANEOUS) ×2 IMPLANT
HOOD PEEL AWAY FLYTE STAYCOOL (MISCELLANEOUS) ×5 IMPLANT
KIT TURNOVER KIT A (KITS) IMPLANT
LINER NEUTRAL 36ID 54OD (Liner) ×1 IMPLANT
NEEDLE HYPO 22GX1.5 SAFETY (NEEDLE) ×2 IMPLANT
PACK ANTERIOR HIP CUSTOM (KITS) ×2 IMPLANT
SROM M HEAD 36MM 2 (Hips) ×2 IMPLANT
STAPLER VISISTAT 35W (STAPLE) IMPLANT
STEM CORAIL KA12 (Stem) ×1 IMPLANT
STRIP CLOSURE SKIN 1/2X4 (GAUZE/BANDAGES/DRESSINGS) ×1 IMPLANT
SUT ETHIBOND NAB CT1 #1 30IN (SUTURE) ×5 IMPLANT
SUT MNCRL AB 3-0 PS2 18 (SUTURE) IMPLANT
SUT VIC AB 0 CT1 36 (SUTURE) ×3 IMPLANT
SUT VIC AB 1 CT1 36 (SUTURE) ×2 IMPLANT
SUT VIC AB 2-0 CT2 27 (SUTURE) ×1 IMPLANT
TRAY FOLEY MTR SLVR 16FR STAT (SET/KITS/TRAYS/PACK) ×2 IMPLANT
YANKAUER SUCT BULB TIP 10FT TU (MISCELLANEOUS) ×2 IMPLANT

## 2019-04-28 NOTE — H&P (Signed)
TOTAL HIP ADMISSION H&P  Patient is admitted for right total hip arthroplasty.  Subjective:  Chief Complaint: right hip pain  HPI: Michael Guzman, 73 y.o. male, has a history of pain and functional disability in the right hip(s) due to arthritis and patient has failed non-surgical conservative treatments for greater than 12 weeks to include NSAID's and/or analgesics, corticosteriod injections, use of assistive devices and activity modification.  Onset of symptoms was gradual starting 3 years ago with gradually worsening course since that time.The patient noted no past surgery on the right hip(s).  Patient currently rates pain in the right hip at 8 out of 10 with activity. Patient has night pain, worsening of pain with activity and weight bearing, trendelenberg gait and pain that interfers with activities of daily living. Patient has evidence of joint space narrowing by imaging studies. This condition presents safety issues increasing the risk of falls. This patient has had failure of all reasonable conservative care.  There is no current active infection.  Patient Active Problem List   Diagnosis Date Noted  . Former smoker (30+ pack years, quit in 2012) 02/02/2019  . Fatty infiltration of liver 11/30/2018  . Hypertriglyceridemia   . Microalbuminuric diabetic nephropathy ([place]) 06/01/2018  . Aortic atherosclerosis ([place]) 05/30/2018  . Type 2 diabetes mellitus with vascular disease ([place]) 01/31/2018  . Personal history of prostate cancer 11/04/2017  . Obesity (BMI 30.0-34.9) 11/03/2017  . Thyroiditis 05/12/2016  . Malignant neoplasm of prostate ([place]) 05/05/2016  . Diabetic peripheral neuropathy ([place]) 03/06/2015  . Medication management 07/24/2014  . Vitamin D deficiency 03/06/2014  . Hyperlipidemia associated with type 2 diabetes mellitus ([place]) 06/28/2012  . ASCAD s/p PTCA (06/2012) 06/27/2012  . Hypertension (1991) 06/25/2012  . Poorly controlled type 2 diabetes mellitus with peripheral  neuropathy ([place]) 06/25/2012  . GERD 06/25/2012   Past Medical History:  Diagnosis Date  . Arthritis   . CAD (coronary artery disease)    a.s/p DES to mid LAD and OM2 06/2012.  Marland Kitchen Chronic back pain   . Chronically dry eyes   . Diabetes mellitus without complication ([place])    TYPE 2   . Elevated PSA    being monitored  . Fatty liver   . GERD (gastroesophageal reflux disease)   . Hx of radiation therapy    prostate , [facility]   . Hypertension   . Hypertriglyceridemia   . Prostate cancer ([place])    RADTIATION     Past Surgical History:  Procedure Laterality Date  . APPENDECTOMY    . CARDIAC SURGERY  ~2017   3 stent placed.  . COLONOSCOPY  02/12/2010   Buccini  . LEFT HEART CATHETERIZATION WITH CORONARY ANGIOGRAM N/A 06/27/2012   Procedure: LEFT HEART CATHETERIZATION WITH CORONARY ANGIOGRAM;  Surgeon: Candee Furbish, MD;  Location: [facility] CATH LAB;  Service: Cardiovascular;  Laterality: N/A;  . LEFT HEART CATHETERIZATION WITH CORONARY ANGIOGRAM N/A 07/25/2012   Procedure: LEFT HEART CATHETERIZATION WITH CORONARY ANGIOGRAM;  Surgeon: Sueanne Margarita, MD;  Location: [place] CATH LAB;  Service: Cardiovascular;  Laterality: N/A;  . PROSTATE BIOPSY    . RADIOLOGY WITH ANESTHESIA N/A 12/03/2015   Procedure: MRI LUMBAR SPINE;  Surgeon: Medication Radiologist, MD;  Location: [place];  Service: Radiology;  Laterality: N/A;    Current Facility-Administered Medications  Medication Dose Route Frequency Provider Last Rate Last Dose  . bupivacaine liposome (EXPAREL) 1.3 % injection 266 mg  20 mL Other Once Dorna Leitz, MD      .  ceFAZolin (ANCEF) IVPB 2g/100 mL premix  2 g Intravenous On Call to OR Dorna Leitz, MD      . chlorhexidine (HIBICLENS) 4 % liquid 4 application  60 mL Topical Once Dorna Leitz, MD      . lactated ringers infusion   Intravenous Continuous Albertha Ghee, MD 50 mL/hr at 04/28/19 4656    . tranexamic acid (CYKLOKAPRON) IVPB 1,000 mg  1,000 mg Intravenous To OR Dorna Leitz, MD       No Known Allergies  Social History   Tobacco Use  . Smoking status: Former Smoker    Years: 50.00    Quit date: 07/27/2011    Years since quitting: 7.7  . Smokeless tobacco: Never Used  Substance Use Topics  . Alcohol use: Not Currently    Comment: qut 20 years ago per patient     Family History  Problem Relation Age of Onset  . Diabetes Sister      ROS ROS: I have reviewed the patient's review of systems thoroughly and there are no positive responses as relates to the HPI. Objective:  Physical Exam  Vital signs in last 24 hours: Temp:  [99.1 F (37.3 C)] 99.1 F (37.3 C) (08/14 0605) Pulse Rate:  [64] 64 (08/14 0605) Resp:  [18] 18 (08/14 0605) BP: (129)/(83) 129/83 (08/14 0605) SpO2:  [97 %] 97 % (08/14 0605) Weight:  [101.6 kg] 101.6 kg (08/14 0528) Well-developed well-nourished patient in no acute distress. Alert and oriented x3 HEENT:within normal limits Cardiac: Regular rate and rhythm Pulmonary: Lungs clear to auscultation Abdomen: Soft and nontender.  Normal active bowel sounds  Musculoskeletal: right hip: Painful range of motion.  Limited range of motion.  Zantac distally. Labs: Recent Results (from the past 2160 hour(s))  Urinalysis, Routine w reflex microscopic     Status: None   Collection Time: 03/07/19 10:26 AM  Result Value Ref Range   Color, Urine YELLOW YELLOW   APPearance CLEAR CLEAR   Specific Gravity, Urine 1.015 1.001 - 1.03   pH < OR = 5.0 5.0 - 8.0   Glucose, UA NEGATIVE NEGATIVE   Bilirubin Urine NEGATIVE NEGATIVE   Ketones, ur NEGATIVE NEGATIVE   Hgb urine dipstick NEGATIVE NEGATIVE   Protein, ur NEGATIVE NEGATIVE   Nitrite NEGATIVE NEGATIVE   Leukocytes,Ua NEGATIVE NEGATIVE  Microalbumin / creatinine urine ratio     Status: None   Collection Time: 03/07/19 10:26 AM  Result Value Ref Range   Creatinine, Urine 94 20 - 320 mg/dL   Microalb, Ur 0.5 mg/dL    Comment: Reference Range Not established    Microalb  Creat Ratio 5 <30 mcg/mg creat    Comment: . The ADA defines abnormalities in albumin excretion as follows: Marland Kitchen Category         Result (mcg/mg creatinine) . Normal                    <30 Microalbuminuria         30-299  Clinical albuminuria   > OR = 300 . The ADA recommends that at least two of three specimens collected within a 3-6 month period be abnormal before considering a patient to be within a diagnostic category.   PSA     Status: None   Collection Time: 03/07/19 10:26 AM  Result Value Ref Range   PSA <0.1 < OR = 4.0 ng/mL    Comment: The total PSA value from this assay system is  standardized against the [facility]  standard. The test  result will be approximately 20% lower when compared  to the equimolar-standardized total PSA (Beckman  Coulter). Comparison of serial PSA results should be  interpreted with this fact in mind. . This test was performed using the Siemens  chemiluminescent method. Values obtained from  different assay methods cannot be used interchangeably. PSA levels, regardless of value, should not be interpreted as absolute evidence of the presence or absence of disease.   Uric acid     Status: None   Collection Time: 03/07/19 10:26 AM  Result Value Ref Range   Uric Acid, Serum 4.7 4.0 - 8.0 mg/dL    Comment: Therapeutic target for gout patients: <6.0 mg/dL .   CBC with Differential/Platelet     Status: Abnormal   Collection Time: 03/07/19 10:26 AM  Result Value Ref Range   WBC 4.0 3.8 - 10.8 Thousand/uL   RBC 4.17 (L) 4.20 - 5.80 Million/uL   Hemoglobin 12.7 (L) 13.2 - 17.1 g/dL   HCT 37.5 (L) 38.5 - 50.0 %   MCV 89.9 80.0 - 100.0 fL   MCH 30.5 27.0 - 33.0 pg   MCHC 33.9 32.0 - 36.0 g/dL   RDW 12.9 11.0 - 15.0 %   Platelets 194 140 - 400 Thousand/uL   MPV 11.2 7.5 - 12.5 fL   Neutro Abs 2,384 1,500 - 7,800 cells/uL   Lymphs Abs 900 850 - 3,900 cells/uL   Absolute Monocytes 556 200 - 950 cells/uL   Eosinophils Absolute 120 15 - 500 cells/uL    Basophils Absolute 40 0 - 200 cells/uL   Neutrophils Relative % 59.6 %   Total Lymphocyte 22.5 %   Monocytes Relative 13.9 %   Eosinophils Relative 3.0 %   Basophils Relative 1.0 %  COMPLETE METABOLIC PANEL WITH GFR     Status: Abnormal   Collection Time: 03/07/19 10:26 AM  Result Value Ref Range   Glucose, Bld 145 (H) 65 - 99 mg/dL    Comment: .            Fasting reference interval . For someone without known diabetes, a glucose value >125 mg/dL indicates that they may have diabetes and this should be confirmed with a follow-up test. .    BUN 9 7 - 25 mg/dL   Creat 0.92 0.70 - 1.18 mg/dL    Comment: For patients >70 years of age, the reference limit for Creatinine is approximately 13% higher for people identified as African-American. .    GFR, Est Non African American 83 > OR = 60 mL/min/1.5m2   GFR, Est African American 97 > OR = 60 mL/min/1.7m2   BUN/Creatinine Ratio NOT APPLICABLE 6 - 22 (calc)   Sodium 140 135 - 146 mmol/L   Potassium 4.4 3.5 - 5.3 mmol/L   Chloride 103 98 - 110 mmol/L   CO2 27 20 - 32 mmol/L   Calcium 9.6 8.6 - 10.3 mg/dL   Total Protein 6.5 6.1 - 8.1 g/dL   Albumin 4.2 3.6 - 5.1 g/dL   Globulin 2.3 1.9 - 3.7 g/dL (calc)   AG Ratio 1.8 1.0 - 2.5 (calc)   Total Bilirubin 0.4 0.2 - 1.2 mg/dL   Alkaline phosphatase (APISO) 56 35 - 144 U/L   AST 65 (H) 10 - 35 U/L   ALT 65 (H) 9 - 46 U/L  Magnesium     Status: None   Collection Time: 03/07/19 10:26 AM  Result Value Ref Range   Magnesium 1.7 1.5 - 2.5  mg/dL  Lipid panel     Status: Abnormal   Collection Time: 03/07/19 10:26 AM  Result Value Ref Range   Cholesterol 99 <200 mg/dL   HDL 33 (L) > OR = 40 mg/dL   Triglycerides 184 (H) <150 mg/dL   LDL Cholesterol (Calc) 40 mg/dL (calc)    Comment: Reference range: <100 . Desirable range <100 mg/dL for primary prevention;   <70 mg/dL for patients with CHD or diabetic patients  with > or = 2 CHD risk factors. Marland Kitchen LDL-C is now calculated using the  Martin-Hopkins  calculation, which is a validated novel method providing  better accuracy than the Friedewald equation in the  estimation of LDL-C.  Cresenciano Genre et al. Annamaria Helling. 8841;660(63): 2061-2068  (http://education.[facility].com/faq/FAQ164)    Total CHOL/HDL Ratio 3.0 <5.0 (calc)   Non-HDL Cholesterol (Calc) 66 <130 mg/dL (calc)    Comment: For patients with diabetes plus 1 major ASCVD risk  factor, treating to a non-HDL-C goal of <100 mg/dL  (LDL-C of <70 mg/dL) is considered a therapeutic  option.   TSH     Status: None   Collection Time: 03/07/19 10:26 AM  Result Value Ref Range   TSH 0.72 0.40 - 4.50 mIU/L  Hemoglobin A1c     Status: Abnormal   Collection Time: 03/07/19 10:26 AM  Result Value Ref Range   Hgb A1c MFr Bld 7.1 (H) <5.7 % of total Hgb    Comment: For someone without known diabetes, a hemoglobin A1c value of 6.5% or greater indicates that they may have  diabetes and this should be confirmed with a follow-up  test. . For someone with known diabetes, a value <7% indicates  that their diabetes is well controlled and a value  greater than or equal to 7% indicates suboptimal  control. A1c targets should be individualized based on  duration of diabetes, age, comorbid conditions, and  other considerations. . Currently, no consensus exists regarding use of hemoglobin A1c for diagnosis of diabetes for children. .    Mean Plasma Glucose 157 (calc)   eAG (mmol/L) 8.7 (calc)  VITAMIN D 25 Hydroxy (Vit-D Deficiency, Fractures)     Status: None   Collection Time: 03/07/19 10:26 AM  Result Value Ref Range   Vit D, 25-Hydroxy 93 30 - 100 ng/mL    Comment: Vitamin D Status         25-OH Vitamin D: . Deficiency:                    <20 ng/mL Insufficiency:             20 - 29 ng/mL Optimal:                 > or = 30 ng/mL . For 25-OH Vitamin D testing on patients on  D2-supplementation and patients for whom quantitation  of D2 and D3 fractions is required, the  QuestAssureD(TM) 25-OH VIT D, (D2,D3), LC/MS/MS is recommended: order  code [id number] (patients >15yrs). See Note 1 . Note 1 . For additional information, please refer to  http://education.[facility].com/faq/FAQ199  (This link is being provided for informational/ educational purposes only.)   Surgical pcr screen     Status: None   Collection Time: 04/24/19  9:17 AM   Specimen: Nasal Mucosa; Nasal Swab  Result Value Ref Range   MRSA, PCR NEGATIVE NEGATIVE   Staphylococcus aureus NEGATIVE NEGATIVE    Comment: (NOTE) The Xpert SA Assay (FDA approved for NASAL specimens in patients 22  years of age and older), is one component of a comprehensive surveillance program. It is not intended to diagnose infection nor to guide or monitor treatment. Performed at [facility], [place] [address] [place], [place] [address]   Glucose, capillary     Status: Abnormal   Collection Time: 04/24/19  9:46 AM  Result Value Ref Range   Glucose-Capillary 45 (L) 70 - 99 mg/dL  Glucose, capillary     Status: Abnormal   Collection Time: 04/24/19 10:06 AM  Result Value Ref Range   Glucose-Capillary 102 (H) 70 - 99 mg/dL  CBC     Status: Abnormal   Collection Time: 04/24/19 10:23 AM  Result Value Ref Range   WBC 5.1 4.0 - 10.5 K/uL   RBC 4.07 (L) 4.22 - 5.81 MIL/uL   Hemoglobin 12.2 (L) 13.0 - 17.0 g/dL   HCT 38.7 (L) 39.0 - 52.0 %   MCV 95.1 80.0 - 100.0 fL   MCH 30.0 26.0 - 34.0 pg   MCHC 31.5 30.0 - 36.0 g/dL   RDW 13.2 11.5 - 15.5 %   Platelets 145 (L) 150 - 400 K/uL   nRBC 0.0 0.0 - 0.2 %    Comment: Performed at [facility], [place] [address], [place], [place] [address]  Comprehensive metabolic panel     Status: Abnormal   Collection Time: 04/24/19 10:23 AM  Result Value Ref Range   Sodium 137 135 - 145 mmol/L   Potassium 3.9 3.5 - 5.1 mmol/L   Chloride 104 98 - 111 mmol/L   CO2 25 22 - 32 mmol/L   Glucose, Bld 107 (H) 70 - 99 mg/dL   BUN 13 8 - 23  mg/dL   Creatinine, Ser 0.96 0.61 - 1.24 mg/dL   Calcium 9.1 8.9 - 10.3 mg/dL   Total Protein 6.8 6.5 - 8.1 g/dL   Albumin 3.9 3.5 - 5.0 g/dL   AST 46 (H) 15 - 41 U/L   ALT 60 (H) 0 - 44 U/L   Alkaline Phosphatase 80 38 - 126 U/L   Total Bilirubin 0.4 0.3 - 1.2 mg/dL   GFR calc non Af Amer >60 >60 mL/min   GFR calc Af Amer >60 >60 mL/min   Anion gap 8 5 - 15    Comment: Performed at [facility], [place] [address] [place], [place] [address]  SARS CORONAVIRUS 2 Nasal Swab Aptima Multi Swab     Status: None   Collection Time: 04/25/19 10:45 AM   Specimen: Aptima Multi Swab; Nasal Swab  Result Value Ref Range   SARS Coronavirus 2 NEGATIVE NEGATIVE    Comment: (NOTE) SARS-CoV-2 target nucleic acids are NOT DETECTED. The SARS-CoV-2 RNA is generally detectable in upper and lower respiratory specimens during the acute phase of infection. Negative results do not preclude SARS-CoV-2 infection, do not rule out co-infections with other pathogens, and should not be used as the sole basis for treatment or other patient management decisions. Negative results must be combined with clinical observations, patient history, and epidemiological information. The expected result is Negative. Fact Sheet for Patients: SugarRoll.be Fact Sheet for Healthcare Providers: https://www.woods-mathews.com/ This test is not yet approved or cleared by the [place] FDA and  has been authorized for detection and/or diagnosis of SARS-CoV-2 by FDA under an Emergency Use Authorization (EUA). This EUA will remain  in effect (meaning this test can be used) for the duration of the COVID-19 declaration under Section 56 4(b)(1) of the Act,  21 U.S.C. section 360bbb-3(b)(1), unless the authorization is terminated or revoked sooner. Performed at [facility], [place] [address], [place], [place] [address]   Glucose, capillary     Status: None   Collection  Time: 04/28/19  5:56 AM  Result Value Ref Range   Glucose-Capillary 73 70 - 99 mg/dL  CBC WITH DIFFERENTIAL     Status: Abnormal   Collection Time: 04/28/19  6:23 AM  Result Value Ref Range   WBC 4.4 4.0 - 10.5 K/uL   RBC 4.16 (L) 4.22 - 5.81 MIL/uL   Hemoglobin 12.7 (L) 13.0 - 17.0 g/dL   HCT 38.0 (L) 39.0 - 52.0 %   MCV 91.3 80.0 - 100.0 fL   MCH 30.5 26.0 - 34.0 pg   MCHC 33.4 30.0 - 36.0 g/dL   RDW 13.0 11.5 - 15.5 %   Platelets 149 (L) 150 - 400 K/uL   nRBC 0.0 0.0 - 0.2 %   Neutrophils Relative % 56 %   Neutro Abs 2.5 1.7 - 7.7 K/uL   Lymphocytes Relative 23 %   Lymphs Abs 1.0 0.7 - 4.0 K/uL   Monocytes Relative 15 %   Monocytes Absolute 0.7 0.1 - 1.0 K/uL   Eosinophils Relative 4 %   Eosinophils Absolute 0.2 0.0 - 0.5 K/uL   Basophils Relative 1 %   Basophils Absolute 0.0 0.0 - 0.1 K/uL   Immature Granulocytes 1 %   Abs Immature Granulocytes 0.03 0.00 - 0.07 K/uL    Comment: Performed at [facility], [place] [address], [place], [place] [address]    Estimated body mass index is 32.14 kg/m as calculated from the following:   Height as of this encounter: 5\' 10"  (1.778 m).   Weight as of this encounter: 101.6 kg.   Imaging Review Plain radiographs demonstrate severe degenerative joint disease of the right hip(s). The bone quality appears to be fair for age and reported activity level.      Assessment/Plan:  End stage arthritis, right hip(s)  The patient history, physical examination, clinical judgement of the provider and imaging studies are consistent with end stage degenerative joint disease of the right hip(s) and total hip arthroplasty is deemed medically necessary. The treatment options including medical management, injection therapy, arthroscopy and arthroplasty were discussed at length. The risks and benefits of total hip arthroplasty were presented and reviewed. The risks due to aseptic loosening, infection, stiffness,  dislocation/subluxation,  thromboembolic complications and other imponderables were discussed.  The patient acknowledged the explanation, agreed to proceed with the plan and consent was signed. Patient is being admitted for inpatient treatment for surgery, pain control, PT, OT, prophylactic antibiotics, VTE prophylaxis, progressive ambulation and ADL's and discharge planning.The patient is planning to be discharged home with home health services

## 2019-04-28 NOTE — Anesthesia Postprocedure Evaluation (Signed)
Anesthesia Post Note  Patient: RIVEN MABILE  Procedure(s) Performed: TOTAL HIP ARTHROPLASTY ANTERIOR APPROACH (Right Hip)     Patient location during evaluation: PACU Anesthesia Type: Spinal Level of consciousness: oriented and awake and alert Pain management: pain level controlled Vital Signs Assessment: post-procedure vital signs reviewed and stable Respiratory status: spontaneous breathing, respiratory function stable and patient connected to nasal cannula oxygen Cardiovascular status: blood pressure returned to baseline and stable Postop Assessment: no headache, no backache and no apparent nausea or vomiting Anesthetic complications: no    Last Vitals:  Vitals:   04/28/19 1202 04/28/19 1306  BP: (!) 154/90 121/88  Pulse: 64 70  Resp: 16 17  Temp:  36.9 C  SpO2: 100% 100%    Last Pain:  Vitals:   04/28/19 1323  TempSrc:   PainSc: 2                  Hemi Chacko S

## 2019-04-28 NOTE — Discharge Instructions (Signed)

## 2019-04-28 NOTE — Brief Op Note (Signed)
04/28/2019  1:45 PM  PATIENT:  Michael Guzman  73 y.o. male  PRE-OPERATIVE DIAGNOSIS:  RIGHT HIP PAIN WITH DEGENERATIVE JOINT DISEASE  POST-OPERATIVE DIAGNOSIS:  RIGHT HIP PAIN WITH DEGENERATIVE JOINT  PROCEDURE:  Procedure(s): TOTAL HIP ARTHROPLASTY ANTERIOR APPROACH (Right)  SURGEON:  Surgeon(s) and Role:    Dorna Leitz, MD - Primary  PHYSICIAN ASSISTANT:   ASSISTANTS: [place]   ANESTHESIA:   spinal  EBL:  100 mL   BLOOD ADMINISTERED:none  DRAINS: none   LOCAL MEDICATIONS USED:  MARCAINE    and OTHER experel  SPECIMEN:  No Specimen  DISPOSITION OF SPECIMEN:  N/A  COUNTS:  YES  TOURNIQUET:  * No tourniquets in log *  DICTATION: .Other Dictation: Dictation Number [id number]  PLAN OF CARE: Admit for overnight observation  PATIENT DISPOSITION:  PACU - hemodynamically stable.   Delay start of Pharmacological VTE agent (>24hrs) due to surgical blood loss or risk of bleeding: no

## 2019-04-28 NOTE — Transfer of Care (Signed)
Immediate Anesthesia Transfer of Care Note  Patient: Michael Guzman  Procedure(s) Performed: TOTAL HIP ARTHROPLASTY ANTERIOR APPROACH (Right Hip)  Patient Location: PACU  Anesthesia Type:Spinal  Level of Consciousness: awake, alert  and oriented  Airway & Oxygen Therapy: Patient Spontanous Breathing and Patient connected to face mask oxygen  Post-op Assessment: Report given to RN and Post -op Vital signs reviewed and stable  Post vital signs: Reviewed and stable  Last Vitals:  Vitals Value Taken Time  BP 92/63 04/28/19 0921  Temp    Pulse 64 04/28/19 0921  Resp    SpO2 100 % 04/28/19 0921  Vitals shown include unvalidated device data.  Last Pain:  Vitals:   04/28/19 0605  TempSrc: Oral  PainSc:       Patients Stated Pain Goal: 4 (67/89/38 1017)  Complications: No apparent anesthesia complications

## 2019-04-28 NOTE — Anesthesia Procedure Notes (Signed)
Spinal  Patient location during procedure: OR End time: 04/28/2019 7:21 AM Staffing Anesthesiologist: Albertha Ghee, MD Resident/CRNA: Maxwell Caul, CRNA Performed: resident/CRNA  Preanesthetic Checklist Completed: patient identified, site marked, surgical consent, pre-op evaluation, timeout performed, IV checked, risks and benefits discussed and monitors and equipment checked Spinal Block Patient position: sitting Prep: DuraPrep Patient monitoring: heart rate, cardiac monitor, continuous pulse ox and blood pressure Approach: midline Location: L3-4 Injection technique: single-shot Needle Needle type: Pencan  Needle gauge: 24 G Needle length: 10 cm Assessment Sensory level: T4 Additional Notes Patient sitting for SAB placement. Single shot, negative heme, negative paresthesia. Patient tolerated well. Dr Marcie Bal at bedside during entire placement.  HKN#[id number], Expiration date 07-15-2019.

## 2019-04-28 NOTE — Evaluation (Signed)
Physical Therapy Evaluation Patient Details Name: Michael Guzman MRN: [id number] DOB: 30-Jun-1946 Today's Date: 04/28/2019   History of Present Illness  73 yo male s/p R DA-THA on 04/28/19. PMH includes DMII with peripheral neuropathy, obesity, prostate cancer, HTN, HLD, CAD with heart cath.  Clinical Impression   Pt presents with R hip pain, decreased R hip strength, impaired arousal level suspect due to medication, difficulty performing mobility tasks, impaired standing balance on POD0, and decreased activity tolerance. Pt to benefit from acute PT to address deficits. Pt required mod assist +2 for bed mobility and transfer this session, unable to progress to gait due to lethargy and hip pain but pt participated in limited pre-gait tasks. Pt educated on ankle pumps (20/hour) to perform this afternoon/evening to increase circulation, to pt's tolerance and limited by pain. PT to progress mobility as tolerated, and will continue to follow acutely.        Follow Up Recommendations Follow surgeon's recommendation for DC plan and follow-up therapies;Supervision for mobility/OOB(HHPT)    Equipment Recommendations  None recommended by PT    Recommendations for Other Services       Precautions / Restrictions Precautions Precautions: Fall Restrictions Weight Bearing Restrictions: No Other Position/Activity Restrictions: WBAT      Mobility  Bed Mobility Overal bed mobility: Needs Assistance Bed Mobility: Supine to Sit;Sit to Supine     Supine to sit: Mod assist;HOB elevated;+2 for safety/equipment Sit to supine: Mod assist;+2 for safety/equipment;+2 for physical assistance;HOB elevated   General bed mobility comments: Mod assist +2 for supine<>sit for LE management, trunk elevation and lowering, getting to and from EOB with use of bed pad, and positioning in bed with bed pad upon return to supine.  Transfers Overall transfer level: Needs assistance Equipment used: Rolling walker (2  wheeled) Transfers: Sit to/from Stand Sit to Stand: Mod assist;+2 safety/equipment;From elevated surface         General transfer comment: Mod assist for power up, steadying. Increased time to rise, pt with posterior leaning and swaying and periods of eye closing. Pt able to take march-like steps in place, limited by pain. PT deferred further mobility at this time due to low arousal level and unsteadiness in standing.  Ambulation/Gait Ambulation/Gait assistance: (NT)              Stairs            Wheelchair Mobility    Modified Rankin (Stroke Patients Only)       Balance Overall balance assessment: Mild deficits observed, not formally tested                                           Pertinent Vitals/Pain Pain Assessment: 0-10 Pain Score: 8  Pain Location: R hip Pain Descriptors / Indicators: Sore Pain Intervention(s): Limited activity within patient's tolerance;Monitored during session;Premedicated before session;Repositioned;Ice applied    Home Living Family/patient expects to be discharged to:: Private residence Living Arrangements: Spouse/significant other Available Help at Discharge: Family;Available 24 hours/day Type of Home: House Home Access: Stairs to enter Entrance Stairs-Rails: Psychiatric nurse of Steps: 3 Home Layout: One level Home Equipment: Cane - single point;Walker - 4 wheels;Walker - 2 wheels Additional Comments: Pt reports he has access to RW if needed, PT recommended using RW    Prior Function Level of Independence: Independent with assistive device(s)         Comments:  pt reports he does everything for self at home, used cane for ambulation PTA     Hand Dominance   Dominant Hand: Right    Extremity/Trunk Assessment   Upper Extremity Assessment Upper Extremity Assessment: Overall WFL for tasks assessed    Lower Extremity Assessment Lower Extremity Assessment: Overall WFL for tasks  assessed;RLE deficits/detail RLE Deficits / Details: suspected post-surgical weakness; able to perform ankle pumps, quad set, heel slide (limited by pain). RLE Sensation: WNL    Cervical / Trunk Assessment Cervical / Trunk Assessment: Normal  Communication   Communication: No difficulties  Cognition Arousal/Alertness: Suspect due to medications;Lethargic Behavior During Therapy: Flat affect Overall Cognitive Status: Within Functional Limits for tasks assessed                                 General Comments: Pt very sleepy, periods of eye closing during subjective and pt's wife prompted pt to alertness. Pt with improved alertness in sitting/standing      General Comments      Exercises     Assessment/Plan    PT Assessment Patient needs continued PT services  PT Problem List Decreased strength;Decreased mobility;Decreased range of motion;Decreased activity tolerance;Decreased balance;Pain;Decreased knowledge of use of DME       PT Treatment Interventions DME instruction;Therapeutic activities;Gait training;Therapeutic exercise;Patient/family education;Balance training;Stair training;Functional mobility training    PT Goals (Current goals can be found in the Care Plan section)  Acute Rehab PT Goals Patient Stated Goal: go home, decrease R hip pain PT Goal Formulation: With patient Time For Goal Achievement: 05/05/19 Potential to Achieve Goals: Good    Frequency 7X/week   Barriers to discharge        Co-evaluation               AM-PAC PT "6 Clicks" Mobility  Outcome Measure Help needed turning from your back to your side while in a flat bed without using bedrails?: A Lot Help needed moving from lying on your back to sitting on the side of a flat bed without using bedrails?: A Lot Help needed moving to and from a bed to a chair (including a wheelchair)?: A Lot Help needed standing up from a chair using your arms (e.g., wheelchair or bedside chair)?:  A Lot Help needed to walk in hospital room?: Total Help needed climbing 3-5 steps with a railing? : Total 6 Click Score: 10    End of Session Equipment Utilized During Treatment: Gait belt Activity Tolerance: Patient limited by lethargy;Patient limited by pain Patient left: in bed;with call bell/phone within reach;with bed alarm set;with family/visitor present;with SCD's reapplied Nurse Communication: Mobility status PT Visit Diagnosis: Difficulty in walking, not elsewhere classified (R26.2);Other abnormalities of gait and mobility (R26.89)    Time: 1610-9604 PT Time Calculation (min) (ACUTE ONLY): 25 min   Charges:   PT Evaluation $PT Eval Low Complexity: 1 Low PT Treatments $Therapeutic Activity: 8-22 mins        Julien Girt, PT Acute Rehabilitation Services Pager [phone]  Office [phone]  Enyah Moman D Elonda Husky 04/28/2019, 5:18 PM

## 2019-04-29 LAB — GLUCOSE, CAPILLARY
Glucose-Capillary: 180 mg/dL — ABNORMAL HIGH (ref 70–99)
Glucose-Capillary: 154 mg/dL — ABNORMAL HIGH (ref 70–99)
Glucose-Capillary: 188 mg/dL — ABNORMAL HIGH (ref 70–99)
Glucose-Capillary: 186 mg/dL — ABNORMAL HIGH (ref 70–99)

## 2019-04-29 LAB — CBC
HCT: 37.1 % — ABNORMAL LOW (ref 39.0–52.0)
Hemoglobin: 11.7 g/dL — ABNORMAL LOW (ref 13.0–17.0)
MCH: 29.2 pg (ref 26.0–34.0)
MCHC: 31.5 g/dL (ref 30.0–36.0)
MCV: 92.5 fL (ref 80.0–100.0)
Platelets: 169 10*3/uL (ref 150–400)
RBC: 4.01 MIL/uL — ABNORMAL LOW (ref 4.22–5.81)
RDW: 13 % (ref 11.5–15.5)
WBC: 8.7 10*3/uL (ref 4.0–10.5)
nRBC: 0 % (ref 0.0–0.2)

## 2019-04-29 MED ORDER — ONDANSETRON HCL 4 MG PO TABS: 4.0000 mg | ORAL_TABLET | Freq: Every day | ORAL | 1 refills | Status: AC | PRN

## 2019-04-29 NOTE — Progress Notes (Signed)
Physical Therapy Treatment Patient Details Name: Michael Guzman MRN: [id number] DOB: [birth year] Today's Date: 04/29/2019    History of Present Illness 73 yo male s/p R DA-THA on 04/28/19. PMH includes DMII with peripheral neuropathy, obesity.    PT Comments    Pt is not progressing with mobility. He remains lethargic. A&O x 2 with prompting. He continues to require significant +2 assist for all mobility tasks. Inconsistent command following. Used STEDY to transfer pt to the recliner this a.m. Will continue to follow and progress activity as tolerated.     Follow Up Recommendations  Follow surgeon's recommendation for DC plan and follow-up therapies;Supervision/Assistance - 24 hour(May need to consider SNF if mobility and cognition do not improve)     Equipment Recommendations  None recommended by PT    Recommendations for Other Services       Precautions / Restrictions Precautions Precautions: Fall Restrictions Weight Bearing Restrictions: No RLE Weight Bearing: Weight bearing as tolerated    Mobility  Bed Mobility Overal bed mobility: Needs Assistance Bed Mobility: Supine to Sit     Supine to sit: Mod assist;+2 for safety/equipment;+2 for physical assistance;HOB elevated     General bed mobility comments: Assist for trunk and R LE. Increased time. Multimodal cueing required.  Transfers Overall transfer level: Needs assistance Equipment used: Rolling walker (2 wheeled) Transfers: Sit to/from Stand  Mod Assist; +2 for safety/equipment; +2 for physical assistance         General transfer comment: Sit to stand x 3. Once with RW, twice with STEDY. Assist to rise, stabilize, control descent. Multimodal ceing and increased time required. Used STEDY to transfer pt to recliner  Ambulation/Gait             General Gait Details: NT-pt unable (too lethargic, weak). High fall risk.   Stairs             Wheelchair Mobility    Modified Rankin (Stroke  Patients Only)       Balance Overall balance assessment: Needs assistance Sitting-balance support: Feet supported Sitting balance-Leahy Scale: Poor Sitting balance - Comments: Min assist to maintain sitting balance. LOB x 2 while sitting EOB. Postural control: Posterior lean Standing balance support: Bilateral upper extremity supported Standing balance-Leahy Scale: Poor                              Cognition Arousal/Alertness: Lethargic Behavior During Therapy: Flat affect Overall Cognitive Status: No family/caregiver present to determine baseline cognitive functioning Area of Impairment: Orientation;Attention;Memory;Following commands;Awareness;Problem solving                 Orientation Level: Disoriented to;Place;Time Current Attention Level: Sustained Memory: Decreased short-term memory;Decreased recall of precautions Following Commands: Follows one step commands inconsistently     Problem Solving: Slow processing;Decreased initiation;Difficulty sequencing;Requires verbal cues;Requires tactile cues        Exercises Total Joint Exercises Ankle Circles/Pumps: AROM;Both;10 reps;Supine Quad Sets: AAROM;Right;10 reps;Supine Heel Slides: AAROM;Right;10 reps;Supine Hip ABduction/ADduction: AAROM;Right;10 reps;Supine    General Comments        Pertinent Vitals/Pain Pain Assessment: Faces Faces Pain Scale: Hurts whole lot Pain Location: R hip with activity Pain Descriptors / Indicators: Grimacing;Discomfort Pain Intervention(s): Limited activity within patient's tolerance;Monitored during session;Repositioned    Home Living Family/patient expects to be discharged to:: Private residence Living Arrangements: Spouse/significant other                  Prior Function  PT Goals (current goals can now be found in the care plan section) Progress towards PT goals: Not progressing toward goals - comment(limited by lethargy, impaired  cognition)    Frequency    7X/week      PT Plan Current plan remains appropriate    Co-evaluation              AM-PAC PT "6 Clicks" Mobility   Outcome Measure  Help needed turning from your back to your side while in a flat bed without using bedrails?: A Lot Help needed moving from lying on your back to sitting on the side of a flat bed without using bedrails?: A Lot Help needed moving to and from a bed to a chair (including a wheelchair)?: A Lot Help needed standing up from a chair using your arms (e.g., wheelchair or bedside chair)?: A Lot Help needed to walk in hospital room?: Total Help needed climbing 3-5 steps with a railing? : Total 6 Click Score: 10    End of Session Equipment Utilized During Treatment: Gait belt Activity Tolerance: Patient limited by lethargy;Patient limited by pain Patient left: in chair;with call bell/phone within reach;with family/visitor present   PT Visit Diagnosis: Other abnormalities of gait and mobility (R26.89);Muscle weakness (generalized) (M62.81);Difficulty in walking, not elsewhere classified (R26.2)     Time: 4801-6553 PT Time Calculation (min) (ACUTE ONLY): 33 min  Charges:  $Gait Training: 8-22 mins $Therapeutic Exercise: 8-22 mins                        Weston Anna, PT Acute Rehabilitation Services Pager: [phone] Office: [phone]

## 2019-04-29 NOTE — Op Note (Signed)
NAME: Michael Guzman, Michael Guzman MEDICAL RECORD [id number] ACCOUNT [id number] DATE OF BIRTH:[birth year] FACILITY: [facility] LOCATION: [facility] PHYSICIAN:Arrietty Dercole L. Charlton Boule, MD  OPERATIVE REPORT  DATE OF PROCEDURE:  04/28/2019  PREOPERATIVE DIAGNOSIS:  End-stage degenerative joint disease, right hip, with severe right hip pain.  POSTOPERATIVE DIAGNOSIS:  End-stage degenerative joint disease, right hip, with severe right hip pain.  PROCEDURE: 1.Right total hip replacement with a Pinnacle porous coated cup 54 with a 54 mm neutral liner for a 36 head, a 12 standard offset Corail stem and a 36 mm -2 metal head ball. 2. Interpretation of multiple intraoperative fluoroscopic images SURGEON:  Dorna Leitz, MD  ASSISTANT:  Gaspar Skeeters PA-C, who was present for the entire case and assisted with retraction, manipulation of the leg, and closing to minimize OR time.  BRIEF HISTORY:  The patient is a 73 year old male with a long history of significant back and right hip pain.  He had treated conservatively for a prolonged period of time.  He had multiple injections into his right hip and after failure of all  conservative care and significant improvement with intraarticular hip injection, which the only that gave him relief, he was taken to the operating room for right total hip replacement.  DESCRIPTION OF PROCEDURE:  The patient was taken to the operating room after adequate anesthesia was obtained with a spinal anesthetic.  The patient was placed supine on the operating table and then moved onto the Hana bed.  All bony prominences well  padded.  Attention was then turned to the right hip where after routine prep and drape, incision was made for an anterior approach to the hip, subcutaneous tissue down to level of the tensor fascia was divided in line with its fibers and the muscle  finger dissected free.  Retractors put in place above and below the neck.  Provisional capsular cut was made.  Capsule was opened and  tagged.  Retractors put in place above and below the neck and the provisional neck cut was made.  The head was removed  and measured to 50 on the back table.  Following this, we put retractors above and below the acetabulum, debrided the labrum and pulvinar and the acetabulum was sequentially reamed to a level of 53 and a 54 mm porous coated cup was hammered into place 45  degrees of lateral opening, 30 degrees of anteversion.  Once that was completed, attention was turned to the stem side, which was opened with a cookie cutter after the hip was externally rotated, adducted and extended.  We then sequentially rasped the  opening to a size 12 Corail which got deliberately cut to an 11 which wiggled a little bit, so we went to a 12.  It was really difficult to get the 12 down.  Finally we were able to get the 12 down to a stable position and did a trial reduction.  We had  happily measured him preoperatively and he was long on the operative side by about 8 mm or so.  We duplicated that on this side with a minus hip ball and really got symmetric leg lengths with the preoperative templating.  At this point, the wounds were  irrigated and suctioned dry.  Capsule was closed with 1 Vicryl running, the tensor fascia was closed with 0 Vicryl running, skin with 0 and 2-0 Vicryl and 3-0 Monocryl subcuticular.  Benzoin, Steri-Strips, dry sterile compressive dressing was applied.   The patient was taken to recovery and noted to be in satisfactory  condition.  Estimated blood loss for procedure was 100 mL, but the final can be gotten from the anesthetic records.  Of note, Gaspar Skeeters was present for the entire case and assisted by  retraction, manipulation of tissues, and closing to minimize OR time.  TN/NUANCE  D:04/28/2019 T:04/28/2019 JOB:[id number]

## 2019-04-29 NOTE — Progress Notes (Signed)
Physical Therapy Treatment Patient Details Name: Michael Guzman MRN: [id number] DOB: 12/13/1945 Today's Date: 04/29/2019    History of Present Illness 73 yo male s/p R DA-THA on 04/28/19. PMH includes DMII with peripheral neuropathy, obesity.    PT Comments    Pt is still not progressing with mobility. Cognition remains impaired-poor processing, disorientation, poor command following). Pt was unable to stand from recliner with RW and +2 assistance. STEDY equipment used again this afternoon. Wife present during session to observe. Will continue to follow.    Follow Up Recommendations  Follow surgeon's recommendation for DC plan and follow-up therapies;Supervision/Assistance - 24 hour (may need to consider SNF placement if mobility and cognition do not improve considerably)     Equipment Recommendations  None recommended by PT    Recommendations for Other Services       Precautions / Restrictions Precautions Precautions: Fall Restrictions Weight Bearing Restrictions: No RLE Weight Bearing: Weight bearing as tolerated    Mobility  Bed Mobility Overal bed mobility: Needs Assistance Bed Mobility: Sit to Supine       Sit to supine: Mod assist;+2 for safety/equipment;+2 for physical assistance;HOB elevated   General bed mobility comments: Assist for trunk and R LE. Increased time. Multimodal cueing required.  Transfers Overall transfer level: Needs assistance Equipment used: Rolling walker (2 wheeled) Transfers: Sit to/from Stand           General transfer comment: Pt unable to stand from recliner with RW and +2 assist. Sit to stand x 2 with STEDY. Used STEDY to transfer pt back to bed. Multimodal cueing and increased time required. Pt unable to bring trunk forward without assistance.  Ambulation/Gait             General Gait Details: NT-pt unable (too lethargic, weak, impaired cognition). High fall risk.   Stairs             Wheelchair Mobility     Modified Rankin (Stroke Patients Only)       Balance Overall balance assessment: Needs assistance         Standing balance support: Bilateral upper extremity supported Standing balance-Leahy Scale: Poor                              Cognition Arousal/Alertness: Lethargic Behavior During Therapy: Flat affect Overall Cognitive Status: Impaired/Different from baseline Area of Impairment: Orientation;Attention;Memory;Following commands;Awareness;Problem solving                 Orientation Level: Disoriented to;Place;Time Current Attention Level: Sustained Memory: Decreased recall of precautions;Decreased short-term memory Following Commands: Follows one step commands inconsistently     Problem Solving: Slow processing;Decreased initiation;Difficulty sequencing;Requires verbal cues;Requires tactile cues        Exercises      General Comments        Pertinent Vitals/Pain Pain Assessment: Faces Faces Pain Scale: Hurts whole lot Pain Location: R hip with activity Pain Descriptors / Indicators: Grimacing;Discomfort Pain Intervention(s): Limited activity within patient's tolerance;Repositioned    Home Living                      Prior Function            PT Goals (current goals can now be found in the care plan section) Progress towards PT goals: Not progressing toward goals - comment    Frequency    7X/week      PT Plan Current plan  remains appropriate    Co-evaluation              AM-PAC PT "6 Clicks" Mobility   Outcome Measure  Help needed turning from your back to your side while in a flat bed without using bedrails?: A Lot Help needed moving from lying on your back to sitting on the side of a flat bed without using bedrails?: A Lot Help needed moving to and from a bed to a chair (including a wheelchair)?: A Lot Help needed standing up from a chair using your arms (e.g., wheelchair or bedside chair)?: A Lot Help  needed to walk in hospital room?: Total Help needed climbing 3-5 steps with a railing? : Total 6 Click Score: 10    End of Session Equipment Utilized During Treatment: Gait belt Activity Tolerance: Patient limited by lethargy;Patient limited by pain Patient left: in bed;with call bell/phone within reach;with nursing/sitter in room;with family/visitor present   PT Visit Diagnosis: Other abnormalities of gait and mobility (R26.89);Muscle weakness (generalized) (M62.81);Difficulty in walking, not elsewhere classified (R26.2)     Time: 1624-4695 PT Time Calculation (min) (ACUTE ONLY): 26 min  Charges:  $Therapeutic Activity: 23-37 mins                        Weston Anna, PT Acute Rehabilitation Services Pager: [phone] Office: [phone]

## 2019-04-29 NOTE — Progress Notes (Signed)
   PATIENT ID: Michael Guzman   1 Day Post-Op Procedure(s) (LRB): TOTAL HIP ARTHROPLASTY ANTERIOR APPROACH (Right)  Subjective: Doing okay this am. R hip pain and nausea, working on getting regulated with medication. Wife reports he hasnt eaten much in the last 2 days due to nausea.   Objective:  Vitals:   04/29/19 0100 04/29/19 0516  BP: 123/80 125/81  Pulse:  82  Resp: 16 18  Temp: 98.6 F (37 C) 99.5 F (37.5 C)  SpO2: 100% 98%     R hip dressing c/d/i Wiggles toes, distally NVI Calves soft nontender  Labs:  Recent Labs    04/28/19 0623 04/29/19 0245  HGB 12.7* 11.7*   Recent Labs    04/28/19 0623 04/29/19 0245  WBC 4.4 8.7  RBC 4.16* 4.01*  HCT 38.0* 37.1*  PLT 149* 169  No results for input(s): NA, K, CL, CO2, BUN, CREATININE, GLUCOSE, CALCIUM in the last 72 hours.  Assessment and Plan: 1 day s/p R THA Up with PT Continue to minimize narcs if possible to help with nausea, prn meds ordered. Encouraged any food/calorie intake and will need to watch blood sugar/insulin  D/c home when cleared by PT and more comfortable, will put d/c orders in today if this is possible this afternoon Fu w dr. Berenice Primas  VTE proph: asa, scds

## 2019-04-30 DIAGNOSIS — I10 Essential (primary) hypertension: Secondary | ICD-10-CM | POA: Diagnosis present

## 2019-04-30 DIAGNOSIS — K219 Gastro-esophageal reflux disease without esophagitis: Secondary | ICD-10-CM | POA: Diagnosis present

## 2019-04-30 DIAGNOSIS — K760 Fatty (change of) liver, not elsewhere classified: Secondary | ICD-10-CM | POA: Diagnosis present

## 2019-04-30 DIAGNOSIS — I700 Atherosclerosis of aorta: Secondary | ICD-10-CM | POA: Diagnosis present

## 2019-04-30 DIAGNOSIS — M1611 Unilateral primary osteoarthritis, right hip: Secondary | ICD-10-CM | POA: Diagnosis present

## 2019-04-30 DIAGNOSIS — Z955 Presence of coronary angioplasty implant and graft: Secondary | ICD-10-CM | POA: Diagnosis not present

## 2019-04-30 DIAGNOSIS — M549 Dorsalgia, unspecified: Secondary | ICD-10-CM | POA: Diagnosis present

## 2019-04-30 DIAGNOSIS — E1142 Type 2 diabetes mellitus with diabetic polyneuropathy: Secondary | ICD-10-CM | POA: Diagnosis present

## 2019-04-30 DIAGNOSIS — Z7982 Long term (current) use of aspirin: Secondary | ICD-10-CM | POA: Diagnosis not present

## 2019-04-30 DIAGNOSIS — I2510 Atherosclerotic heart disease of native coronary artery without angina pectoris: Secondary | ICD-10-CM | POA: Diagnosis present

## 2019-04-30 DIAGNOSIS — G8929 Other chronic pain: Secondary | ICD-10-CM | POA: Diagnosis present

## 2019-04-30 DIAGNOSIS — Z923 Personal history of irradiation: Secondary | ICD-10-CM | POA: Diagnosis not present

## 2019-04-30 DIAGNOSIS — Z8546 Personal history of malignant neoplasm of prostate: Secondary | ICD-10-CM | POA: Diagnosis not present

## 2019-04-30 DIAGNOSIS — Z833 Family history of diabetes mellitus: Secondary | ICD-10-CM | POA: Diagnosis not present

## 2019-04-30 DIAGNOSIS — E781 Pure hyperglyceridemia: Secondary | ICD-10-CM | POA: Diagnosis present

## 2019-04-30 DIAGNOSIS — E785 Hyperlipidemia, unspecified: Secondary | ICD-10-CM | POA: Diagnosis present

## 2019-04-30 DIAGNOSIS — M1610 Unilateral primary osteoarthritis, unspecified hip: Secondary | ICD-10-CM | POA: Diagnosis present

## 2019-04-30 DIAGNOSIS — E1165 Type 2 diabetes mellitus with hyperglycemia: Secondary | ICD-10-CM | POA: Diagnosis present

## 2019-04-30 DIAGNOSIS — E1169 Type 2 diabetes mellitus with other specified complication: Secondary | ICD-10-CM | POA: Diagnosis present

## 2019-04-30 DIAGNOSIS — Z87891 Personal history of nicotine dependence: Secondary | ICD-10-CM | POA: Diagnosis not present

## 2019-04-30 DIAGNOSIS — Z794 Long term (current) use of insulin: Secondary | ICD-10-CM | POA: Diagnosis not present

## 2019-04-30 LAB — BASIC METABOLIC PANEL
Anion gap: 12 (ref 5–15)
BUN: 14 mg/dL (ref 8–23)
CO2: 19 mmol/L — ABNORMAL LOW (ref 22–32)
Calcium: 9.2 mg/dL (ref 8.9–10.3)
Chloride: 104 mmol/L (ref 98–111)
Creatinine, Ser: 1.05 mg/dL (ref 0.61–1.24)
GFR calc Af Amer: >60 mL/min (ref >60)
GFR calc non Af Amer: >60 mL/min (ref >60)
Glucose, Bld: 216 mg/dL — ABNORMAL HIGH (ref 70–99)
Potassium: 5.1 mmol/L (ref 3.5–5.1)
Sodium: 135 mmol/L (ref 135–145)

## 2019-04-30 LAB — CBC
HCT: 38.1 % — ABNORMAL LOW (ref 39.0–52.0)
Hemoglobin: 12.5 g/dL — ABNORMAL LOW (ref 13.0–17.0)
MCH: 30 pg (ref 26.0–34.0)
MCHC: 32.8 g/dL (ref 30.0–36.0)
MCV: 91.4 fL (ref 80.0–100.0)
Platelets: 165 10*3/uL (ref 150–400)
RBC: 4.17 MIL/uL — ABNORMAL LOW (ref 4.22–5.81)
RDW: 13.1 % (ref 11.5–15.5)
WBC: 12.2 10*3/uL — ABNORMAL HIGH (ref 4.0–10.5)
nRBC: 0 % (ref 0.0–0.2)

## 2019-04-30 LAB — GLUCOSE, CAPILLARY
Glucose-Capillary: 221 mg/dL — ABNORMAL HIGH (ref 70–99)
Glucose-Capillary: 251 mg/dL — ABNORMAL HIGH (ref 70–99)
Glucose-Capillary: 168 mg/dL — ABNORMAL HIGH (ref 70–99)
Glucose-Capillary: 162 mg/dL — ABNORMAL HIGH (ref 70–99)

## 2019-04-30 MED ORDER — TRAMADOL HCL 50 MG PO TABS
50.0000 mg | ORAL_TABLET | ORAL | Status: DC | PRN
  Administered 2019-05-01: 50 mg via ORAL
  Filled 2019-04-30: qty 1

## 2019-04-30 MED ORDER — OXYCODONE HCL 5 MG PO TABS: 5.0000 mg | ORAL_TABLET | Freq: Three times a day (TID) | ORAL | Status: DC | PRN

## 2019-04-30 NOTE — Progress Notes (Signed)
   PATIENT ID: Michael Guzman   2 Days Post-Op Procedure(s) (LRB): TOTAL HIP ARTHROPLASTY ANTERIOR APPROACH (Right)  Subjective: Patient reports feeling better this am. Aware of self and location. Pain controlled with tylenol.  Objective:  Vitals:   04/29/19 2047 04/30/19 0636  BP: (!) 148/95 132/79  Pulse: 97 96  Resp: 16 14  Temp: 99.6 F (37.6 C) 99 F (37.2 C)  SpO2: 97% 96%    R hip dressing c/d/i Wiggles toes, distally NVI Calves soft nontenderR hip dressing c/d/i Wiggles toes, distally NVI Calves soft nontender   Labs:  Recent Labs    04/28/19 0623 04/29/19 0245 04/30/19 0316  HGB 12.7* 11.7* 12.5*   Recent Labs    04/29/19 0245 04/30/19 0316  WBC 8.7 12.2*  RBC 4.01* 4.17*  HCT 37.1* 38.1*  PLT 169 165   Recent Labs    04/30/19 0316  NA 135  K 5.1  CL 104  CO2 19*  BUN 14  CREATININE 1.05  GLUCOSE 216*  CALCIUM 9.2    Assessment and Plan  2 days s/p R THA Up with PT Continue to minimize narcs, will order tramadol for breakthrough pain.  D/c home when cleared by PT and more comfortable, call to put d/c orders in today if this is possible this afternoon Fu w dr. Berenice Primas  VTE proph: asa, scds

## 2019-04-30 NOTE — Progress Notes (Signed)
Physical Therapy Treatment Patient Details Name: Michael Guzman MRN: [id number] DOB: 03/10/1946 Today's Date: 04/30/2019    History of Present Illness 73 yo male s/p R DA-THA on 04/28/19. PMH includes DMII with peripheral neuropathy, obesity.    PT Comments    Pt is progressing slowly. Some improvement in cognition compared to yesterday. Will continue to follow and progress activity as tolerated. Pt is improving slowly but still may need to consider placement.   Follow Up Recommendations  Follow surgeon's recommendation for DC plan and follow-up therapies;Supervision/Assistance - 24 hour     Equipment Recommendations  None recommended by PT    Recommendations for Other Services       Precautions / Restrictions Precautions Precautions: Fall Restrictions Weight Bearing Restrictions: No RLE Weight Bearing: Weight bearing as tolerated    Mobility  Bed Mobility Overal bed mobility: Needs Assistance Bed Mobility: Supine to Sit     Supine to sit: Mod assist;+2 for safety/equipment;+2 for physical assistance;HOB elevated     General bed mobility comments: Assist for trunk and R LE. Increased time. Multimodal cueing required.  Transfers Overall transfer level: Needs assistance Equipment used: Rolling walker (2 wheeled) Transfers: Sit to/from Stand Sit to Stand: Mod assist;+2 safety/equipment;From elevated surface         General transfer comment: Assist to rise, stabilize, control descent. VCs safety, technique, hand placement. Increaesed time.  Ambulation/Gait Ambulation/Gait assistance: Min assist;+2 safety/equipment Gait Distance (Feet): 15 Feet Assistive device: Rolling walker (2 wheeled) Gait Pattern/deviations: Step-to pattern     General Gait Details: Very slow gait speed. Followed closely with a recliner. VCs safety, technique, sequence. Pt fatigues easily.   Stairs             Wheelchair Mobility    Modified Rankin (Stroke Patients Only)        Balance Overall balance assessment: Needs assistance         Standing balance support: Bilateral upper extremity supported Standing balance-Leahy Scale: Poor                              Cognition Arousal/Alertness: Awake/alert Behavior During Therapy: Flat affect Overall Cognitive Status: Impaired/Different from baseline                         Following Commands: Follows one step commands with increased time     Problem Solving: Slow processing;Requires tactile cues;Requires verbal cues;Difficulty sequencing;Decreased initiation        Exercises      General Comments        Pertinent Vitals/Pain Pain Assessment: Faces Faces Pain Scale: Hurts whole lot Pain Location: R hip with activity Pain Descriptors / Indicators: Grimacing;Discomfort Pain Intervention(s): Limited activity within patient's tolerance;Repositioned;Ice applied    Home Living                      Prior Function            PT Goals (current goals can now be found in the care plan section) Progress towards PT goals: Progressing toward goals    Frequency    7X/week      PT Plan Current plan remains appropriate    Co-evaluation              AM-PAC PT "6 Clicks" Mobility   Outcome Measure  Help needed turning from your back to your side while in a flat bed  without using bedrails?: A Lot Help needed moving from lying on your back to sitting on the side of a flat bed without using bedrails?: A Lot Help needed moving to and from a bed to a chair (including a wheelchair)?: A Lot Help needed standing up from a chair using your arms (e.g., wheelchair or bedside chair)?: A Lot Help needed to walk in hospital room?: A Lot Help needed climbing 3-5 steps with a railing? : Total 6 Click Score: 11    End of Session Equipment Utilized During Treatment: Gait belt Activity Tolerance: Patient limited by pain;Patient limited by fatigue Patient left: in  chair;with call bell/phone within reach;with chair alarm set   PT Visit Diagnosis: Other abnormalities of gait and mobility (R26.89);Muscle weakness (generalized) (M62.81);Difficulty in walking, not elsewhere classified (R26.2)     Time: 4268-3419 PT Time Calculation (min) (ACUTE ONLY): 16 min  Charges:  $Gait Training: 8-22 mins                        Weston Anna, PT Acute Rehabilitation Services Pager: [phone] Office: [phone]

## 2019-04-30 NOTE — Progress Notes (Signed)
Physical Therapy Treatment Patient Details Name: Michael Guzman MRN: [id number] DOB: [birth year] Today's Date: 04/30/2019    History of Present Illness 73 yo male s/p R DA-THA on 04/28/19. PMH includes DMII with peripheral neuropathy, obesity.    PT Comments    Progressing slowly with mobility.    Follow Up Recommendations  Follow surgeon's recommendation for DC plan and follow-up therapies;Supervision/Assistance - 24 hour     Equipment Recommendations  None recommended by PT    Recommendations for Other Services       Precautions / Restrictions Precautions Precautions: Fall Restrictions Weight Bearing Restrictions: No RLE Weight Bearing: Weight bearing as tolerated    Mobility  Bed Mobility Overal bed mobility: Needs Assistance Bed Mobility: Supine to Sit;Sit to Supine     Supine to sit: Mod assist;+2 for physical assistance;+2 for safety/equipment;HOB elevated Sit to supine: Mod assist;HOB elevated   General bed mobility comments: Assist for trunk and R LE. Increased time. Multimodal cueing required.  Transfers Overall transfer level: Needs assistance Equipment used: Rolling walker (2 wheeled) Transfers: Sit to/from Stand Sit to Stand: Mod assist         General transfer comment: Assist to rise, stabilize, control descent. VCs safety, technique, hand placement. Increaesed time.  Ambulation/Gait Ambulation/Gait assistance: Min assist; +2 safety/equipment Gait Distance (Feet): 30 Feet Assistive device: Rolling walker (2 wheeled) Gait Pattern/deviations: Step-to pattern;Step-through pattern;Decreased stride length     General Gait Details: Very slow gait speed. VCs safety, technique, sequence. Pt fatigues easily.   Stairs             Wheelchair Mobility    Modified Rankin (Stroke Patients Only)       Balance Overall balance assessment: Needs assistance         Standing balance support: Bilateral upper extremity supported Standing  balance-Leahy Scale: Poor                              Cognition Arousal/Alertness: Awake/alert Behavior During Therapy: Flat affect Overall Cognitive Status: Impaired/Different from baseline                         Following Commands: Follows multi-step commands with increased time     Problem Solving: Slow processing;Requires verbal cues;Requires tactile cues        Exercises Total Joint Exercises Ankle Circles/Pumps: AROM;Both;10 reps;Supine Quad Sets: AROM;Both;10 reps;Supine Heel Slides: AAROM;Right;10 reps;Supine Hip ABduction/ADduction: AAROM;Right;10 reps;Supine    General Comments        Pertinent Vitals/Pain Pain Assessment: Faces Faces Pain Scale: Hurts even more Pain Location: R hip with activity Pain Descriptors / Indicators: Grimacing;Discomfort Pain Intervention(s): Limited activity within patient's tolerance;Ice applied;Repositioned    Home Living                      Prior Function            PT Goals (current goals can now be found in the care plan section) Progress towards PT goals: Progressing toward goals    Frequency    7X/week      PT Plan Current plan remains appropriate    Co-evaluation              AM-PAC PT "6 Clicks" Mobility   Outcome Measure  Help needed turning from your back to your side while in a flat bed without using bedrails?: A Lot Help needed moving from lying  on your back to sitting on the side of a flat bed without using bedrails?: A Lot Help needed moving to and from a bed to a chair (including a wheelchair)?: A Little Help needed standing up from a chair using your arms (e.g., wheelchair or bedside chair)?: A Lot Help needed to walk in hospital room?: A Little Help needed climbing 3-5 steps with a railing? : A Lot 6 Click Score: 14    End of Session Equipment Utilized During Treatment: Gait belt Activity Tolerance: Patient limited by pain;Patient limited by  fatigue Patient left: in bed;with call bell/phone within reach;with bed alarm set;with family/visitor present   PT Visit Diagnosis: Other abnormalities of gait and mobility (R26.89);Muscle weakness (generalized) (M62.81);Difficulty in walking, not elsewhere classified (R26.2)     Time: 2248-2500 PT Time Calculation (min) (ACUTE ONLY): 23 min  Charges:  $Gait Training: 8-22 mins $Therapeutic Exercise: 8-22 mins                        Weston Anna, PT Acute Rehabilitation Services Pager: [phone] Office: [phone]

## 2019-04-30 NOTE — Progress Notes (Signed)
Pt stable with no needs at time of rounding on pt. Pt denies pain. No changes to note in pt overall condition.

## 2019-05-01 ENCOUNTER — Encounter (HOSPITAL_COMMUNITY): Payer: Self-pay | Admitting: Orthopedic Surgery

## 2019-05-01 LAB — CBC
HCT: 34.1 % — ABNORMAL LOW (ref 39.0–52.0)
Hemoglobin: 11.2 g/dL — ABNORMAL LOW (ref 13.0–17.0)
MCH: 30.3 pg (ref 26.0–34.0)
MCHC: 32.8 g/dL (ref 30.0–36.0)
MCV: 92.2 fL (ref 80.0–100.0)
Platelets: 172 10*3/uL (ref 150–400)
RBC: 3.7 MIL/uL — ABNORMAL LOW (ref 4.22–5.81)
RDW: 13.7 % (ref 11.5–15.5)
WBC: 13.1 10*3/uL — ABNORMAL HIGH (ref 4.0–10.5)
nRBC: 0 % (ref 0.0–0.2)

## 2019-05-01 LAB — GLUCOSE, CAPILLARY
Glucose-Capillary: 166 mg/dL — ABNORMAL HIGH (ref 70–99)
Glucose-Capillary: 173 mg/dL — ABNORMAL HIGH (ref 70–99)

## 2019-05-01 MED ORDER — TRAMADOL HCL 50 MG PO TABS: 50.0000 mg | ORAL_TABLET | Freq: Four times a day (QID) | ORAL | 0 refills | Status: AC | PRN

## 2019-05-01 NOTE — Progress Notes (Signed)
Physical Therapy Treatment Patient Details Name: Michael Guzman MRN: [id number] DOB: 07-29-1946 Today's Date: 05/01/2019    History of Present Illness 73 yo male s/p R DA-THA on 04/28/19. PMH includes DMII with peripheral neuropathy, obesity.    PT Comments    Pt continues to progress. 2nd session to practice gait and stair training. Wife present to observe. All education completed. Okay to d/c from PT standpoint.    Follow Up Recommendations  Follow surgeon's recommendation for DC plan and follow-up therapies;Supervision/Assistance - 24 hour     Equipment Recommendations  None recommended by PT    Recommendations for Other Services       Precautions / Restrictions Precautions Precautions: Fall Restrictions Weight Bearing Restrictions: No RLE Weight Bearing: Weight bearing as tolerated    Mobility  Bed Mobility               General bed mobility comments: oob in recliner  Transfers Overall transfer level: Needs assistance Equipment used: (P) Rolling walker (2 wheeled) Transfers: Sit to/from Stand Sit to Stand: Min assist         General transfer comment: Assist to steady during rise and in standing. VCs safety, technique, hand placement.  Ambulation/Gait Ambulation/Gait assistance: Min assist Gait Distance (Feet): 45 Feet Assistive device: Rolling walker (2 wheeled) Gait Pattern/deviations: Step-through pattern;Decreased stride length     General Gait Details: Very slow gait speed. VCs safety, technique, sequence. Pt fatigues fairly easily.   Stairs Stairs: Yes Stairs assistance: Min assist Stair Management: Forwards;Two rails;Step to pattern Number of Stairs: 5 General stair comments: up and over portable steps x 2. VCs safety, technique, sequence. Wife present to observe.   Wheelchair Mobility    Modified Rankin (Stroke Patients Only)       Balance Overall balance assessment: Needs assistance         Standing balance support:  Bilateral upper extremity supported Standing balance-Leahy Scale: Poor                              Cognition Arousal/Alertness: Awake/alert Behavior During Therapy: Flat affect Overall Cognitive Status: Impaired/Different from baseline                         Following Commands: Follows multi-step commands with increased time     Problem Solving: Slow processing;Requires verbal cues;Requires tactile cues        Exercises      General Comments        Pertinent Vitals/Pain Pain Assessment: Faces Faces Pain Scale: Hurts little more Pain Location: R hip with activity Pain Descriptors / Indicators: Discomfort;Sore;Tightness Pain Intervention(s): Monitored during session    Home Living                      Prior Function            PT Goals (current goals can now be found in the care plan section) Progress towards PT goals: Progressing toward goals    Frequency    7X/week      PT Plan Current plan remains appropriate    Co-evaluation              AM-PAC PT "6 Clicks" Mobility   Outcome Measure  Help needed turning from your back to your side while in a flat bed without using bedrails?: A Lot Help needed moving from lying on your back to sitting  on the side of a flat bed without using bedrails?: A Lot Help needed moving to and from a bed to a chair (including a wheelchair)?: A Little Help needed standing up from a chair using your arms (e.g., wheelchair or bedside chair)?: A Little Help needed to walk in hospital room?: A Little Help needed climbing 3-5 steps with a railing? : A Little 6 Click Score: 16    End of Session Equipment Utilized During Treatment: Gait belt Activity Tolerance: Patient tolerated treatment well Patient left: in bed;with call bell/phone within reach;with family/visitor present   PT Visit Diagnosis: Other abnormalities of gait and mobility (R26.89);Muscle weakness (generalized)  (M62.81);Difficulty in walking, not elsewhere classified (R26.2)     Time: 1224-4975 PT Time Calculation (min) (ACUTE ONLY): 12 min  Charges:  $Gait Training: 8-22 mins                        Weston Anna, PT Acute Rehabilitation Services Pager: [phone] Office: [phone]

## 2019-05-01 NOTE — Progress Notes (Signed)
Physical Therapy Treatment Patient Details Name: Michael Guzman MRN: [id number] DOB: 05-21-1946 Today's Date: 05/01/2019    History of Present Illness 73 yo male s/p R DA-THA on 04/28/19. PMH includes DMII with peripheral neuropathy, obesity.    PT Comments    Progressing with mobility. Processing is still a bit slow but improving. Will plan to have a 2nd session to practice stair negotiation prior to d/c.    Follow Up Recommendations  Follow surgeon's recommendation for DC plan and follow-up therapies;Supervision/Assistance - 24 hour     Equipment Recommendations  None recommended by PT    Recommendations for Other Services       Precautions / Restrictions Precautions Precautions: Fall Restrictions Weight Bearing Restrictions: No RLE Weight Bearing: Weight bearing as tolerated    Mobility  Bed Mobility Overal bed mobility: Needs Assistance Bed Mobility: Supine to Sit     Supine to sit: Mod assist;HOB elevated     General bed mobility comments: Assist for trunk and R LE. Increased time. Multimodal cueing required. Task is still effortful for pt.  Transfers Overall transfer level: Needs assistance Equipment used: Rolling walker (2 wheeled) Transfers: Sit to/from Stand Sit to Stand: Min guard         General transfer comment: Close guard for safety. VCs safety, hand placement.  Ambulation/Gait Ambulation/Gait assistance: Min assist Gait Distance (Feet): 42 Feet Assistive device: Rolling walker (2 wheeled) Gait Pattern/deviations: Step-to pattern;Step-through pattern;Decreased stride length     General Gait Details: Very slow gait speed. VCs safety, technique, sequence. Pt fatigues easily.   Stairs             Wheelchair Mobility    Modified Rankin (Stroke Patients Only)       Balance Overall balance assessment: Needs assistance         Standing balance support: Bilateral upper extremity supported                                 Cognition Arousal/Alertness: Awake/alert Behavior During Therapy: Flat affect Overall Cognitive Status: Impaired/Different from baseline                         Following Commands: Follows multi-step commands with increased time     Problem Solving: Slow processing;Requires verbal cues;Requires tactile cues        Exercises Total Joint Exercises Ankle Circles/Pumps: AROM;Both;10 reps;Supine Quad Sets: AROM;Both;10 reps;Supine Heel Slides: AAROM;Right;10 reps;Supine Hip ABduction/ADduction: AAROM;Right;10 reps;Supine    General Comments        Pertinent Vitals/Pain Pain Assessment: Faces Faces Pain Scale: Hurts little more Pain Location: R hip with activity Pain Descriptors / Indicators: Discomfort;Sore;Tightness Pain Intervention(s): Monitored during session;Repositioned    Home Living                      Prior Function            PT Goals (current goals can now be found in the care plan section) Progress towards PT goals: Progressing toward goals    Frequency    7X/week      PT Plan Current plan remains appropriate    Co-evaluation              AM-PAC PT "6 Clicks" Mobility   Outcome Measure  Help needed turning from your back to your side while in a flat bed without using bedrails?: A Lot Help  needed moving from lying on your back to sitting on the side of a flat bed without using bedrails?: A Lot Help needed moving to and from a bed to a chair (including a wheelchair)?: A Little Help needed standing up from a chair using your arms (e.g., wheelchair or bedside chair)?: A Little Help needed to walk in hospital room?: A Little Help needed climbing 3-5 steps with a railing? : A Lot 6 Click Score: 15    End of Session Equipment Utilized During Treatment: Gait belt Activity Tolerance: Patient limited by fatigue Patient left: in chair;with call bell/phone within reach;with chair alarm set   PT Visit Diagnosis: Other  abnormalities of gait and mobility (R26.89);Muscle weakness (generalized) (M62.81);Difficulty in walking, not elsewhere classified (R26.2)     Time: 2426-8341 PT Time Calculation (min) (ACUTE ONLY): 17 min  Charges:  $Gait Training: 8-22 mins                        Weston Anna, PT Acute Rehabilitation Services Pager: [phone] Office: [phone]

## 2019-05-01 NOTE — Progress Notes (Signed)
Subjective: 3 Days Post-Op Procedure(s) (LRB): TOTAL HIP ARTHROPLASTY ANTERIOR APPROACH (Right) Patient reports pain as mild.  Patient has no pain at rest.  Only moderate pain when up.  Taking by mouth and voiding okay.  Objective: Vital signs in last 24 hours: Temp:  [98.4 F (36.9 C)-98.9 F (37.2 C)] 98.4 F (36.9 C) (08/17 0625) Pulse Rate:  [79-93] 85 (08/17 0811) Resp:  [14-18] 14 (08/17 0625) BP: (115-132)/(70-78) 116/70 (08/17 0811) SpO2:  [98 %-100 %] 98 % (08/17 0625) Weight:  [101.6 kg] 101.6 kg (08/16 2300)  Intake/Output from previous day: 08/16 0701 - 08/17 0700 In: 840 [P.O.:840] Out: 2725 [Urine:2725] Intake/Output this shift: No intake/output data recorded.  Recent Labs    04/29/19 0245 04/30/19 0316 05/01/19 0243  HGB 11.7* 12.5* 11.2*   Recent Labs    04/30/19 0316 05/01/19 0243  WBC 12.2* 13.1*  RBC 4.17* 3.70*  HCT 38.1* 34.1*  PLT 165 172   Recent Labs    04/30/19 0316  NA 135  K 5.1  CL 104  CO2 19*  BUN 14  CREATININE 1.05  GLUCOSE 216*  CALCIUM 9.2   No results for input(s): LABPT, INR in the last 72 hours. Right hip exam: Dressing clean and dry.  No significant swelling.  Right calf is soft and nontender.  No pain with range of motion of the right hip. Patient is alert and oriented this morning.   Assessment/Plan: 3 Days Post-Op Procedure(s) (LRB): TOTAL HIP ARTHROPLASTY ANTERIOR APPROACH (Right) slow progress with physical therapy over the weekend.  Some confusion with narcotic pain medication. Plan: Up with physical therapy today.  Weightbearing as tolerated right lower extremity. Will use Tylenol and tramadol only for pain.  No strong narcotics. We will discharge home after passes physical therapy hopefully later today.  The patient reports she has good help at home. Aspirin 325 mg twice daily for DVT prophylaxis x1 month postop. Follow-up with Dr. Berenice Primas in 10 to 14 days.   Erlene Senters 05/01/2019, 8:17 AM

## 2019-05-01 NOTE — Progress Notes (Signed)
The pt and pt's Wife were provided with d/c instructions. After discussing the pt's plan of care, the pt and pt's Wife reported no questions or concerns.   The pt has one more session of PT before he can leave.

## 2019-05-01 NOTE — TOC Transition Note (Signed)
Transition of Care [facility]) - CM/SW Discharge Note   Patient Details  Name: Michael Guzman MRN: [id number] Date of Birth: Mar 02, 1946  [facility]) CM/SW Contact:  Leeroy Cha, RN Phone Number: 05/01/2019, 9:20 AM   Clinical Narrative:    dcd to home with family, equipment and hhc through [facility]   Final next level of care: [place] Barriers to Discharge: No Barriers Identified   Patient Goals and CMS Choice Patient states their goals for this hospitalization and ongoing recovery are:: to goh ome and start walking CMS Medicare.gov Compare Post Acute Care list provided to:: Patient Choice offered to / list presented to : Patient  Discharge Placement                       Discharge Plan and Services   Discharge Planning Services: CM Consult Post Acute Care Choice: Home Health, Durable Medical Equipment          DME Arranged: Walker rolling, 3-N-1 DME Agency: [employer] Date DME Agency Contacted: 05/01/19 Time DME Agency Contacted: 0900 Representative spoke with at DME Agency: nathan HH Arranged: PT [place]: [facility] [employer] Date [place]: 05/01/19 Time [place]: [phone] Representative spoke with at [place]: [place] ([place]) Interventions     Readmission Risk Interventions No flowsheet data found.

## 2019-05-01 NOTE — TOC Progression Note (Signed)
Transition of Care [facility]) - Progression Note    Patient Details  Name: Michael Guzman MRN: [id number] Date of Birth: 01/05/1946  [facility] of [facility]) CM/SW Contact  Leeroy Cha, RN Phone Number: 05/01/2019, 8:13 AM  Clinical Narrative:     Physical therapy ordered sent to [facility] with kah       Expected Discharge Plan and Services           Expected Discharge Date: 04/29/19                         HH Arranged: PT HH Agency: [facility] [employer] Date [place]: 05/01/19 Time [place]: [phone] Representative spoke with at [place]: [place] ([employer]) Interventions    Readmission Risk Interventions No flowsheet data found.

## 2019-05-01 NOTE — Discharge Summary (Signed)
Patient ID: Michael Guzman MRN: [id number] DOB/AGE: [birth year] 73 y.o.  Admit date: 04/28/2019 Discharge date: 05/01/2019  Admission Diagnoses:  Principal Problem:   Primary osteoarthritis of right hip Active Problems:   Primary osteoarthritis of hip   Discharge Diagnoses:  Same  Past Medical History:  Diagnosis Date  . Arthritis   . CAD (coronary artery disease)    a.s/p DES to mid LAD and OM2 06/2012.  Marland Kitchen Chronic back pain   . Chronically dry eyes   . Diabetes mellitus without complication ([place])    TYPE 2   . Elevated PSA    being monitored  . Fatty liver   . GERD (gastroesophageal reflux disease)   . Hx of radiation therapy    prostate , [facility]   . Hypertension   . Hypertriglyceridemia   . Prostate cancer [facility])    RADTIATION     Surgeries: Procedure(s): Right TOTAL HIP ARTHROPLASTY ANTERIOR APPROACH on 04/28/2019   Consultants:   Discharged Condition: Improved  Hospital Course: Michael Guzman is an 73 y.o. male who was admitted 04/28/2019 for operative treatment ofPrimary osteoarthritis of right hip. Patient has severe unremitting pain that affects sleep, daily activities, and work/hobbies. After pre-op clearance the patient was taken to the operating room on 04/28/2019 and underwent  Procedure(s): Right TOTAL HIP ARTHROPLASTY ANTERIOR APPROACH.    Patient was given perioperative antibiotics:  Anti-infectives (From admission, onward)   Start     Dose/Rate Route Frequency Ordered Stop   04/28/19 1400  ceFAZolin (ANCEF) IVPB 2g/100 mL premix     2 g 200 mL/hr over 30 Minutes Intravenous Every 6 hours 04/28/19 1101 04/28/19 2232   04/28/19 0615  ceFAZolin (ANCEF) IVPB 2g/100 mL premix     2 g 200 mL/hr over 30 Minutes Intravenous On call to O.R. 04/28/19 2671 04/28/19 0743       Patient was given sequential compression devices, early ambulation, and chemoprophylaxis to prevent DVT.  Patient made slow progress with physical therapy  initially due to narcotics causing cognition difficulties.  On the date of discharge the patient was afebrile his vital signs are stable.  He had manageable right hip pain mostly taking Tylenol only for pain with tramadol as a backup.  Patient reportedly had excellent help at home with his wife at home.  Patient benefited maximally from hospital stay and there were no complications.    Recent vital signs:  Patient Vitals for the past 24 hrs:  BP Temp Temp src Pulse Resp SpO2 Height Weight  05/01/19 0811 116/70 - - 85 - - - -  05/01/19 0625 116/76 98.4 F (36.9 C) Oral 85 14 98 % - -  04/30/19 2300 - - - - - - 5' 10"  (1.778 m) 101.6 kg  04/30/19 2238 115/74 98.5 F (36.9 C) Oral 79 18 100 % - -  04/30/19 1352 132/78 98.9 F (37.2 C) Oral 93 18 100 % - -     Recent laboratory studies:  Recent Labs    04/30/19 0316 05/01/19 0243  WBC 12.2* 13.1*  HGB 12.5* 11.2*  HCT 38.1* 34.1*  PLT 165 172  NA 135  --   K 5.1  --   CL 104  --   CO2 19*  --   BUN 14  --   CREATININE 1.05  --   GLUCOSE 216*  --   CALCIUM 9.2  --      Discharge Medications:   Allergies as of 05/01/2019  No Known Allergies     Medication List    STOP taking these medications   hydrocortisone 25 MG suppository Commonly known as: ANUSOL-HC     TAKE these medications   aspirin EC 325 MG tablet Take 1 tablet (325 mg total) by mouth 2 (two) times daily after a meal. Take x 1 month post op to decrease risk of blood clots. What changed:   medication strength  how much to take  when to take this  additional instructions   atorvastatin 20 MG tablet Commonly known as: LIPITOR TAKE 1 TABLET BY MOUTH  DAILY   buPROPion 300 MG 24 hr tablet Commonly known as: WELLBUTRIN XL TAKE 1 TABLET BY MOUTH  EVERY MORNING What changed:   how much to take  how to take this  when to take this  additional instructions   docusate sodium 100 MG capsule Commonly known as: Colace Take 1 capsule (100 mg  total) by mouth 2 (two) times daily.   furosemide 40 MG tablet Commonly known as: Lasix Take 1 tablet 2 x/ day as needed  for fluid retention What changed:   how much to take  how to take this  when to take this  reasons to take this  additional instructions   gabapentin 800 MG tablet Commonly known as: NEURONTIN TAKE 1 TABLET BY MOUTH 3 TO 4 TIMES DAILY FOR PAIN, HOT FLASHES AND SWEATS What changed:   how much to take  how to take this  when to take this  additional instructions   glucose blood test strip Commonly known as: ONE TOUCH ULTRA TEST USE WITH METER TO CHECK  BLOOD SUGAR 2 TIMES DAILY   OneTouch Ultra test strip Generic drug: glucose blood Check blood sugar 3 times a day.DX-E11.22   insulin NPH-regular Human (70-30) 100 UNIT/ML injection Inject 100 Units into the skin daily.   Magnesium 250 MG Tabs Take 250 mg by mouth daily.   metFORMIN 500 MG 24 hr tablet Commonly known as: GLUCOPHAGE-XR TAKE 1 TO 2 TABLETS BY  MOUTH TWO TIMES DAILY AS  DIRECTED FOR DIABETES What changed: See the new instructions.   metoprolol succinate 25 MG 24 hr tablet Commonly known as: TOPROL-XL TAKE ONE-HALF TABLET BY  MOUTH DAILY WITH OR  IMMEDIATELY FOLLOWING A  MEAL. What changed: See the new instructions.   montelukast 10 MG tablet Commonly known as: SINGULAIR TAKE 1 TABLET BY MOUTH AT  BEDTIME   multivitamin with minerals Tabs tablet Take 1 tablet by mouth daily.   nitroGLYCERIN 0.4 MG SL tablet Commonly known as: NITROSTAT PLACE 1 TABLET UNDER THE TONGUE EVERY 5 MINUTES AS NEEDED FOR CHEST PAIN What changed:   how much to take  how to take this  when to take this  reasons to take this   ondansetron 4 MG tablet Commonly known as: Zofran Take 1 tablet (4 mg total) by mouth daily as needed for nausea or vomiting.   ONE TOUCH ULTRA SYSTEM KIT w/Device Kit Check blood sugar 1 time daily-DX-E11.22   ONE TOUCH ULTRA 2 w/Device Kit Check blood sugar 1  time [id number]   OneTouch Delica Lancets 81X Misc Check blood sugar 3 times a day-DX-E11.22   oxyCODONE-acetaminophen 5-325 MG tablet Commonly known as: PERCOCET/ROXICET Take 1-2 tablets by mouth every 6 (six) hours as needed for severe pain.   pantoprazole 40 MG tablet Commonly known as: PROTONIX TAKE 1 TABLET BY MOUTH  DAILY   Systane 0.4-0.3 % Soln Generic drug: Polyethyl Glycol-Propyl  Glycol Place 2 drops into both eyes daily as needed (for dry eyes).   tamsulosin 0.4 MG Caps capsule Commonly known as: FLOMAX Take 0.4 mg by mouth at bedtime.   Testosterone 20.25 MG/1.25GM (1.62%) Gel Place 1 packet onto the skin daily.   tiZANidine 2 MG tablet Commonly known as: ZANAFLEX Take 1 tablet (2 mg total) by mouth every 8 (eight) hours as needed for muscle spasms.   traMADol 50 MG tablet Commonly known as: Ultram Take 1-2 tablets (50-100 mg total) by mouth every 6 (six) hours as needed for moderate pain.   vitamin C 100 MG tablet Take 100 mg by mouth daily.   Vitamin D3 125 MCG (5000 UT) Caps Take 10,000 Units by mouth daily.   Zinc 30 MG Tabs Take 30 mg by mouth daily.            Discharge Care Instructions  (From admission, onward)         Start     Ordered   05/01/19 0000  Weight bearing as tolerated    Question Answer Comment  Laterality right   Extremity Lower      05/01/19 0823        He was instructed not to fill the oxycodone for pain, only the tramadol.  Diagnostic Studies: Dg C-arm 1-60 Min-no Report  Result Date: 04/28/2019 Fluoroscopy was utilized by the requesting physician.  No radiographic interpretation.   Dg Hip Operative Unilat W Or W/o Pelvis Right  Result Date: 04/28/2019 CLINICAL DATA:  Right hip replacement EXAM: OPERATIVE RIGHT HIP WITH PELVIS COMPARISON:  None. FLUOROSCOPY TIME:  Radiation Exposure Index (as provided by the fluoroscopic device): Not available If the device does not provide the exposure index: Fluoroscopy  Time:  20 seconds Number of Acquired Images:  2 FINDINGS: Right hip replacement is noted in satisfactory position. No acute bony abnormality is noted. IMPRESSION: Status post right hip replacement Electronically Signed   By: Inez Catalina M.D.   On: 04/28/2019 09:42    Disposition: Discharge disposition: 01-Home or Self Care       Discharge Instructions    Call MD / Call 911   Complete by: As directed    If you experience chest pain or shortness of breath, CALL 911 and be transported to the hospital emergency room.  If you develope a fever above 101 F, pus (white drainage) or increased drainage or redness at the wound, or calf pain, call your surgeon's office.   Call MD / Call 911   Complete by: As directed    If you experience chest pain or shortness of breath, CALL 911 and be transported to the hospital emergency room.  If you develope a fever above 101 F, pus (white drainage) or increased drainage or redness at the wound, or calf pain, call your surgeon's office.   Constipation Prevention   Complete by: As directed    Drink plenty of fluids.  Prune juice may be helpful.  You may use a stool softener, such as Colace (over the counter) 100 mg twice a day.  Use MiraLax (over the counter) for constipation as needed.   Diet - low sodium heart healthy   Complete by: As directed    Diet Carb Modified   Complete by: As directed    Face-to-face encounter (required for Medicare/Medicaid patients)   Complete by: As directed    I Erlene Senters certify that this patient is under my care and that I, or a nurse practitioner  or 53 assistant working with me, had a face-to-face encounter that meets the physician face-to-face encounter requirements with this patient on 04/28/2019. The encounter with the patient was in whole, or in part for the following medical condition(s) which is the primary reason for home health care (List medical condition): Status post right total hip arthroplasty   The  encounter with the patient was in whole, or in part, for the following medical condition, which is the primary reason for home health care: Status post right total hip arthroplasty   I certify that, based on my findings, the following services are medically necessary home health services: Physical therapy   Reason for Medically Necessary Home Health Services:  Therapy- Personnel officer, Public librarian Therapy- Therapeutic Exercises to Increase Strength and Endurance     My clinical findings support the need for the above services: Pain interferes with ambulation/mobility   Further, I certify that my clinical findings support that this patient is homebound due to: Unable to leave home safely without assistance   Home Health   Complete by: As directed    To provide the following care/treatments: PT   Increase activity slowly as tolerated   Complete by: As directed    Increase activity slowly as tolerated   Complete by: As directed    Weight bearing as tolerated   Complete by: As directed    Laterality: right   Extremity: Lower      Follow-up Information    Dorna Leitz, MD. Schedule an appointment as soon as possible for a visit in 2 weeks.   Specialty: Orthopedic Surgery Contact information: [address] [place] [id number] [phone]            Signed: Erlene Senters 05/01/2019, 8:23 AM

## 2019-05-02 ENCOUNTER — Encounter: Payer: Self-pay | Admitting: *Deleted

## 2019-05-02 ENCOUNTER — Telehealth: Payer: Self-pay | Admitting: *Deleted

## 2019-05-02 NOTE — Telephone Encounter (Signed)
Called patient on 05/02/2019 , 10:41 AM in an attempt to reach the patient for a hospital follow up.   Admit date: 04/28/19 Discharge: 05/01/19   He does not have any questions or concerns about medications from the hospital admission. The patient's medications were reviewed over the phone, they were counseled to bring in all current medications to the hospital follow up visit. He has an appointment 05/09/2019 with Dale Sedalia for a follow up.  I advised the patient to call if any questions or concerns arise about the hospital admission or medications. He stated hispain is being controlled with Tramadol.  He has been able to ambulate at home   Home health was started in the hospital.  All questions were answered and a follow up appointment was made.  Patient states home health has contacted the patient in regard to physical therapy.  Prior to Admission medications   Medication Sig Start Date End Date Taking? Authorizing Provider  Ascorbic Acid (VITAMIN C) 100 MG tablet Take 100 mg by mouth daily.    [provider]  aspirin EC 325 MG tablet Take 1 tablet (325 mg total) by mouth 2 (two) times daily after a meal. Take x 1 month post op to decrease risk of blood clots. 04/28/19   Gary Fleet, PA-C  atorvastatin (LIPITOR) 20 MG tablet TAKE 1 TABLET BY MOUTH  DAILY Patient taking differently: Take 20 mg by mouth daily.  01/31/19   Jerline Pain, MD  Blood Glucose Monitoring Suppl (ONE TOUCH ULTRA 2) w/Device KIT Check blood sugar 1 time daily-DX-E11.22 04/26/19   [name], MD  Blood Glucose Monitoring Suppl (ONE TOUCH ULTRA SYSTEM KIT) w/Device KIT Check blood sugar 1 time daily-DX-E11.22 Patient not taking: Reported on 04/19/2019 04/09/16   [name], MD  buPROPion (WELLBUTRIN XL) 300 MG 24 hr tablet TAKE 1 TABLET BY MOUTH  EVERY MORNING Patient taking differently: Take 300 mg by mouth daily.  01/30/19   [name], MD  Cholecalciferol (VITAMIN D3) 125 MCG (5000 UT)  CAPS Take 10,000 Units by mouth daily.     [provider]  docusate sodium (COLACE) 100 MG capsule Take 1 capsule (100 mg total) by mouth 2 (two) times daily. 04/28/19   Gary Fleet, PA-C  furosemide (LASIX) 40 MG tablet Take 1 tablet 2 x/ day as needed  for fluid retention Patient taking differently: Take 40 mg by mouth 2 (two) times daily as needed for fluid.  01/16/18   [name], MD  gabapentin (NEURONTIN) 800 MG tablet TAKE 1 TABLET BY MOUTH 3 TO 4 TIMES DAILY FOR PAIN, HOT FLASHES AND SWEATS Patient taking differently: Take 1,600 mg by mouth 2 (two) times daily.  04/18/19   Liane Comber, NP  glucose blood (ONE TOUCH ULTRA TEST) test strip USE WITH METER TO CHECK  BLOOD SUGAR 2 TIMES DAILY Patient not taking: Reported on 04/19/2019 03/03/18   Liane Comber, NP  glucose blood (ONETOUCH ULTRA) test strip Check blood sugar 3 times a day.ZW-C58.52 04/26/19   [name], MD  insulin NPH-regular Human (70-30) 100 UNIT/ML injection Inject 100 Units into the skin daily.    [provider]  Magnesium 250 MG TABS Take 250 mg by mouth daily.     [provider]  metFORMIN (GLUCOPHAGE-XR) 500 MG 24 hr tablet TAKE 1 TO 2 TABLETS BY  MOUTH TWO TIMES DAILY AS  DIRECTED FOR DIABETES Patient taking differently: Take 1,000 mg by mouth 2 (two) times daily with a meal.  10/31/18   [name], MD  metoprolol succinate (TOPROL-XL) 25 MG 24 hr tablet TAKE ONE-HALF TABLET BY  MOUTH DAILY WITH OR  IMMEDIATELY FOLLOWING A  MEAL. Patient taking differently: Take 12.5 mg by mouth daily.  04/18/19   Jerline Pain, MD  montelukast (SINGULAIR) 10 MG tablet TAKE 1 TABLET BY MOUTH AT  BEDTIME Patient taking differently: Take 10 mg by mouth at bedtime.  01/30/19   [name], MD  Multiple Vitamin (MULTIVITAMIN WITH MINERALS) TABS tablet Take 1 tablet by mouth daily.    [provider]  nitroGLYCERIN (NITROSTAT) 0.4 MG SL tablet PLACE 1 TABLET UNDER THE TONGUE EVERY 5  MINUTES AS NEEDED FOR CHEST PAIN Patient taking differently: Place 0.4 mg under the tongue every 5 (five) minutes as needed for chest pain. PLACE 1 TABLET UNDER THE TONGUE EVERY 5 MINUTES AS NEEDED FOR CHEST PAIN 05/02/18   [name], MD  ondansetron [facility]) 4 MG tablet Take 1 tablet (4 mg total) by mouth daily as needed for nausea or vomiting. 04/29/19 04/28/20  Grier Mitts, PA-C  OneTouch Delica Lancets 74F MISC Check blood sugar 3 times a day-DX-E11.22 04/27/19   [name], MD  oxyCODONE-acetaminophen (PERCOCET/ROXICET) 5-325 MG tablet Take 1-2 tablets by mouth every 6 (six) hours as needed for severe pain. 04/28/19   Gary Fleet, PA-C  pantoprazole (PROTONIX) 40 MG tablet TAKE 1 TABLET BY MOUTH  DAILY Patient taking differently: Take 40 mg by mouth daily.  01/30/19   Liane Comber, NP  Polyethyl Glycol-Propyl Glycol (SYSTANE) 0.4-0.3 % SOLN Place 2 drops into both eyes daily as needed (for dry eyes).    [provider]  tamsulosin (FLOMAX) 0.4 MG CAPS capsule Take 0.4 mg by mouth at bedtime.    [provider]  Testosterone 20.25 MG/1.25GM (1.62%) GEL Place 1 packet onto the skin daily. 04/07/19   [provider]  tiZANidine (ZANAFLEX) 2 MG tablet Take 1 tablet (2 mg total) by mouth every 8 (eight) hours as needed for muscle spasms. 04/28/19   Gary Fleet, PA-C  traMADol (ULTRAM) 50 MG tablet Take 1-2 tablets (50-100 mg total) by mouth every 6 (six) hours as needed for moderate pain. 05/01/19 04/30/20  Gary Fleet, PA-C  Zinc 30 MG TABS Take 30 mg by mouth daily.    [provider]

## 2019-05-03 DIAGNOSIS — Z7984 Long term (current) use of oral hypoglycemic drugs: Secondary | ICD-10-CM | POA: Diagnosis not present

## 2019-05-03 DIAGNOSIS — E1169 Type 2 diabetes mellitus with other specified complication: Secondary | ICD-10-CM | POA: Diagnosis not present

## 2019-05-03 DIAGNOSIS — I10 Essential (primary) hypertension: Secondary | ICD-10-CM | POA: Diagnosis not present

## 2019-05-03 DIAGNOSIS — K760 Fatty (change of) liver, not elsewhere classified: Secondary | ICD-10-CM | POA: Diagnosis not present

## 2019-05-03 DIAGNOSIS — Z87891 Personal history of nicotine dependence: Secondary | ICD-10-CM | POA: Diagnosis not present

## 2019-05-03 DIAGNOSIS — Z7982 Long term (current) use of aspirin: Secondary | ICD-10-CM | POA: Diagnosis not present

## 2019-05-03 DIAGNOSIS — Z471 Aftercare following joint replacement surgery: Secondary | ICD-10-CM | POA: Diagnosis not present

## 2019-05-03 DIAGNOSIS — Z96641 Presence of right artificial hip joint: Secondary | ICD-10-CM | POA: Diagnosis not present

## 2019-05-03 DIAGNOSIS — K219 Gastro-esophageal reflux disease without esophagitis: Secondary | ICD-10-CM | POA: Diagnosis not present

## 2019-05-03 DIAGNOSIS — E559 Vitamin D deficiency, unspecified: Secondary | ICD-10-CM | POA: Diagnosis not present

## 2019-05-03 DIAGNOSIS — Z9181 History of falling: Secondary | ICD-10-CM | POA: Diagnosis not present

## 2019-05-03 DIAGNOSIS — E785 Hyperlipidemia, unspecified: Secondary | ICD-10-CM | POA: Diagnosis not present

## 2019-05-03 DIAGNOSIS — I2510 Atherosclerotic heart disease of native coronary artery without angina pectoris: Secondary | ICD-10-CM | POA: Diagnosis not present

## 2019-05-03 DIAGNOSIS — E1142 Type 2 diabetes mellitus with diabetic polyneuropathy: Secondary | ICD-10-CM | POA: Diagnosis not present

## 2019-05-05 DIAGNOSIS — I2510 Atherosclerotic heart disease of native coronary artery without angina pectoris: Secondary | ICD-10-CM | POA: Diagnosis not present

## 2019-05-05 DIAGNOSIS — Z87891 Personal history of nicotine dependence: Secondary | ICD-10-CM | POA: Diagnosis not present

## 2019-05-05 DIAGNOSIS — Z7984 Long term (current) use of oral hypoglycemic drugs: Secondary | ICD-10-CM | POA: Diagnosis not present

## 2019-05-05 DIAGNOSIS — Z96641 Presence of right artificial hip joint: Secondary | ICD-10-CM | POA: Diagnosis not present

## 2019-05-05 DIAGNOSIS — Z7982 Long term (current) use of aspirin: Secondary | ICD-10-CM | POA: Diagnosis not present

## 2019-05-05 DIAGNOSIS — K760 Fatty (change of) liver, not elsewhere classified: Secondary | ICD-10-CM | POA: Diagnosis not present

## 2019-05-05 DIAGNOSIS — E785 Hyperlipidemia, unspecified: Secondary | ICD-10-CM | POA: Diagnosis not present

## 2019-05-05 DIAGNOSIS — E1142 Type 2 diabetes mellitus with diabetic polyneuropathy: Secondary | ICD-10-CM | POA: Diagnosis not present

## 2019-05-05 DIAGNOSIS — E1169 Type 2 diabetes mellitus with other specified complication: Secondary | ICD-10-CM | POA: Diagnosis not present

## 2019-05-05 DIAGNOSIS — I10 Essential (primary) hypertension: Secondary | ICD-10-CM | POA: Diagnosis not present

## 2019-05-05 DIAGNOSIS — E559 Vitamin D deficiency, unspecified: Secondary | ICD-10-CM | POA: Diagnosis not present

## 2019-05-05 DIAGNOSIS — Z471 Aftercare following joint replacement surgery: Secondary | ICD-10-CM | POA: Diagnosis not present

## 2019-05-05 DIAGNOSIS — Z9181 History of falling: Secondary | ICD-10-CM | POA: Diagnosis not present

## 2019-05-05 DIAGNOSIS — K219 Gastro-esophageal reflux disease without esophagitis: Secondary | ICD-10-CM | POA: Diagnosis not present

## 2019-05-08 NOTE — Progress Notes (Signed)
Hospital follow up  Assessment and Plan: Hospital visit follow up for Right hip total arthroplasty anterior approach.  Michael Guzman was seen today for follow-up.  Diagnoses and all orders for this visit:  Status post total hip replacement, right Doing well at this time Has Ortho follow up scheduled Continue home health and exercises Pain well controlled Use Tylenol 1,051m two tablets TID PRN  Essential hypertension Hypertension Continue medication: Monitor blood pressure at home; call if consistently over 130/80 Continue DASH diet.   Reminder to go to the ER if any CP, SOB, nausea, dizziness, severe HA, changes vision/speech, left arm numbness and tingling and jaw pain.  Hyperlipidemia associated with type 2 diabetes mellitus (HCC) Cholesterol Continue medications Continue low cholesterol diet and exercise.  Check lipid panel.   Gastroesophageal reflux disease without esophagitis Continue PPI/H2 blocker, diet discussed  Poorly controlled type 2 diabetes mellitus with peripheral neuropathy (HCC) Experiencing hypoglycemia Changed regiment of NPH 70/30 from 100units daily to 40units in am and 30units in evening. Take with meals.  Vitamin D deficiency Continue supplementation  Medication management Continued  Type 2 diabetes mellitus with vascular disease ([place] Continue to monitor for glucose control Control lipids   Obesity (BMI 30.0-34.9) Discussed dietary and exercise modifications.  Labs deferred this visit, has follow up with surgeon today.  All medications were reviewed with patient and family and fully reconciled. All questions answered fully, and patient and family members were encouraged to call the office with any further questions or concerns. Discussed goal to avoid readmission related to this diagnosis.   Medications Discontinued During This Encounter  Medication Reason  . oxyCODONE-acetaminophen (PERCOCET/ROXICET) 5-325 MG tablet Completed Course  .  oxyCODONE-acetaminophen (PERCOCET/ROXICET) 5-325 MG tablet Completed Course  . montelukast (SINGULAIR) 10 MG tablet     Over 40 minutes of exam, counseling, chart review, and complex, high/moderate level critical decision making was performed this visit.   Future Appointments  Date Time Provider [place] 06/14/2019 11:15 AM CLiane Comber NP [facility] None  09/13/2019  2:30 PM MUnk Pinto MD [facility] None  04/11/2020 10:00 AM MUnk Pinto MD [facility] None     HPI 73y.o.male presents for follow up for transition from recent hospitalization. Admit date to the hospital was 04/28/19, patient was discharged from the hospital on 05/01/19 and our clinical staff contacted the office the day after discharge to set up a follow up appointment. The discharge summary, medications, and diagnostic test results were reviewed before meeting with the patient. The patient was admitted for:    Home health is involved. They are coming twice a week for physical therapy.  He has had three sessions so farm.  He has another session today.  Pain is well controlled and he is not taking any.  He reports walking with the cane makes him tire out faster.  He is also   He is taking tramadol BID.   He is also taking 1,0068mtwice a day.  He reports the tramadol is not really working.  He reports he is sleeping well and he wakes once in the moddl eof the night to iuse the bathroom.  He has a bedside urinal if needed but he has been walking to the bathroom.  He uses the cane at night.  He also has a four prong walker that he uses when going out.  There is a dry dressing over right hip incision.  There is no breakthrough drainage on dressing.  Surrounding skin is free of any erythema.  Current Outpatient Medications (Endocrine & Metabolic):  .  insulin NPH-regular Human (70-30) 100 UNIT/ML injection, Inject 100 Units into the skin daily. .  metFORMIN (GLUCOPHAGE-XR) 500 MG 24 hr tablet, TAKE 1 TO 2  TABLETS BY  MOUTH TWO TIMES DAILY AS  DIRECTED FOR DIABETES (Patient taking differently: Take 1,000 mg by mouth 2 (two) times daily with a meal. ) .  Testosterone 20.25 MG/1.25GM (1.62%) GEL, Place 1 packet onto the skin daily.  Current Outpatient Medications (Cardiovascular):  .  atorvastatin (LIPITOR) 20 MG tablet, TAKE 1 TABLET BY MOUTH  DAILY (Patient taking differently: Take 20 mg by mouth daily. ) .  furosemide (LASIX) 40 MG tablet, Take 1 tablet 2 x/ day as needed  for fluid retention (Patient taking differently: Take 40 mg by mouth 2 (two) times daily as needed for fluid. ) .  metoprolol succinate (TOPROL-XL) 25 MG 24 hr tablet, TAKE ONE-HALF TABLET BY  MOUTH DAILY WITH OR  IMMEDIATELY FOLLOWING A  MEAL. (Patient taking differently: Take 12.5 mg by mouth daily. ) .  nitroGLYCERIN (NITROSTAT) 0.4 MG SL tablet, PLACE 1 TABLET UNDER THE TONGUE EVERY 5 MINUTES AS NEEDED FOR CHEST PAIN (Patient taking differently: Place 0.4 mg under the tongue every 5 (five) minutes as needed for chest pain. PLACE 1 TABLET UNDER THE TONGUE EVERY 5 MINUTES AS NEEDED FOR CHEST PAIN)  Current Outpatient Medications (Respiratory):  .  montelukast (SINGULAIR) 10 MG tablet, Take 1 tablet (10 mg total) by mouth at bedtime.  Current Outpatient Medications (Analgesics):  .  aspirin EC 325 MG tablet, Take 1 tablet (325 mg total) by mouth 2 (two) times daily after a meal. Take x 1 month post op to decrease risk of blood clots. .  traMADol (ULTRAM) 50 MG tablet, Take 1-2 tablets (50-100 mg total) by mouth every 6 (six) hours as needed for moderate pain.   Current Outpatient Medications (Other):  Marland Kitchen  Ascorbic Acid (VITAMIN C) 100 MG tablet, Take 100 mg by mouth daily. .  Blood Glucose Monitoring Suppl (ONE TOUCH ULTRA 2) w/Device KIT, Check blood sugar 1 time daily-DX-E11.22 .  Blood Glucose Monitoring Suppl (ONE TOUCH ULTRA SYSTEM KIT) w/Device KIT, Check blood sugar 1 time daily-DX-E11.22 .  buPROPion (WELLBUTRIN XL) 300  MG 24 hr tablet, TAKE 1 TABLET BY MOUTH  EVERY MORNING (Patient taking differently: Take 300 mg by mouth daily. ) .  Cholecalciferol (VITAMIN D3) 125 MCG (5000 UT) CAPS, Take 10,000 Units by mouth daily.  Marland Kitchen  docusate sodium (COLACE) 100 MG capsule, Take 1 capsule (100 mg total) by mouth 2 (two) times daily. Marland Kitchen  gabapentin (NEURONTIN) 800 MG tablet, TAKE 1 TABLET BY MOUTH 3 TO 4 TIMES DAILY FOR PAIN, HOT FLASHES AND SWEATS (Patient taking differently: Take 1,600 mg by mouth 2 (two) times daily. ) .  glucose blood (ONE TOUCH ULTRA TEST) test strip, USE WITH METER TO CHECK  BLOOD SUGAR 2 TIMES DAILY .  glucose blood (ONETOUCH ULTRA) test strip, Check blood sugar 3 times a day.DX-E11.22 .  Magnesium 250 MG TABS, Take 250 mg by mouth daily.  .  Multiple Vitamin (MULTIVITAMIN WITH MINERALS) TABS tablet, Take 1 tablet by mouth daily. .  ondansetron (ZOFRAN) 4 MG tablet, Take 1 tablet (4 mg total) by mouth daily as needed for nausea or vomiting. Glory Rosebush Delica Lancets 75I MISC, Check blood sugar 3 times a day-DX-E11.22 .  pantoprazole (PROTONIX) 40 MG tablet, TAKE 1 TABLET BY MOUTH  DAILY (Patient taking differently: Take  40 mg by mouth daily. ) .  Polyethyl Glycol-Propyl Glycol (SYSTANE) 0.4-0.3 % SOLN, Place 2 drops into both eyes daily as needed (for dry eyes). .  tamsulosin (FLOMAX) 0.4 MG CAPS capsule, Take 0.4 mg by mouth at bedtime. Marland Kitchen  tiZANidine (ZANAFLEX) 2 MG tablet, Take 1 tablet (2 mg total) by mouth every 8 (eight) hours as needed for muscle spasms. .  Zinc 30 MG TABS, Take 30 mg by mouth daily.  Past Medical History:  Diagnosis Date  . Arthritis   . CAD (coronary artery disease)    a.s/p DES to mid LAD and OM2 06/2012.  Marland Kitchen Chronic back pain   . Chronically dry eyes   . Diabetes mellitus without complication ([place])    TYPE 2   . Elevated PSA    being monitored  . Fatty liver   . GERD (gastroesophageal reflux disease)   . Hx of radiation therapy    prostate , [facility]  [place]   . Hypertension   . Hypertriglyceridemia   . Prostate cancer ([place])    RADTIATION      No Known Allergies  ROS: all negative except above.   Physical Exam: Filed Weights   05/09/19 1111  Weight: 202 lb (91.6 kg)   BP 122/66   Pulse 88   Temp 97.7 F (36.5 C)   Wt 202 lb (91.6 kg)   SpO2 99%   BMI 28.98 kg/m  General Appearance: Well nourished, in no apparent distress. Eyes: PERRLA, EOMs, conjunctiva no swelling or erythema Sinuses: No Frontal/maxillary tenderness ENT/Mouth: Ext aud canals clear, TMs without erythema, bulging. No erythema, swelling, or exudate on post pharynx.  Tonsils not swollen or erythematous. Hearing normal.  Neck: Supple, thyroid normal.  Respiratory: Respiratory effort normal, BS equal bilaterally without rales, rhonchi, wheezing or stridor.  Cardio: RRR with no MRGs. Brisk peripheral pulses without edema.  Abdomen: Soft, + BS.  Non tender, no guarding, rebound, hernias, masses. Lymphatics: Non tender without lymphadenopathy.  Musculoskeletal: Full ROM, 5/5 strength, normal gait.  Skin: Warm, dry without rashes, lesions, ecchymosis. Dry dressing intact over lateral  right hip. Neuro: Cranial nerves intact. Normal muscle tone, no cerebellar symptoms. Sensation intact.  Psych: Awake and oriented X 3, normal affect, Insight and Judgment appropriate.     Garnet Sierras, NP 11:50 AM [facility]

## 2019-05-09 ENCOUNTER — Ambulatory Visit (INDEPENDENT_AMBULATORY_CARE_PROVIDER_SITE_OTHER): Payer: Medicare Other | Admitting: Adult Health Nurse Practitioner

## 2019-05-09 ENCOUNTER — Encounter: Payer: Self-pay | Admitting: Adult Health Nurse Practitioner

## 2019-05-09 ENCOUNTER — Other Ambulatory Visit: Payer: Self-pay

## 2019-05-09 VITALS — BP 122/66 | HR 88 | Temp 97.7°F | Wt 202.0 lb

## 2019-05-09 DIAGNOSIS — Z9181 History of falling: Secondary | ICD-10-CM | POA: Diagnosis not present

## 2019-05-09 DIAGNOSIS — K219 Gastro-esophageal reflux disease without esophagitis: Secondary | ICD-10-CM

## 2019-05-09 DIAGNOSIS — Z96641 Presence of right artificial hip joint: Secondary | ICD-10-CM | POA: Diagnosis not present

## 2019-05-09 DIAGNOSIS — E669 Obesity, unspecified: Secondary | ICD-10-CM

## 2019-05-09 DIAGNOSIS — Z87891 Personal history of nicotine dependence: Secondary | ICD-10-CM | POA: Diagnosis not present

## 2019-05-09 DIAGNOSIS — K760 Fatty (change of) liver, not elsewhere classified: Secondary | ICD-10-CM | POA: Diagnosis not present

## 2019-05-09 DIAGNOSIS — E1169 Type 2 diabetes mellitus with other specified complication: Secondary | ICD-10-CM | POA: Diagnosis not present

## 2019-05-09 DIAGNOSIS — I10 Essential (primary) hypertension: Secondary | ICD-10-CM | POA: Diagnosis not present

## 2019-05-09 DIAGNOSIS — E559 Vitamin D deficiency, unspecified: Secondary | ICD-10-CM

## 2019-05-09 DIAGNOSIS — E1142 Type 2 diabetes mellitus with diabetic polyneuropathy: Secondary | ICD-10-CM | POA: Diagnosis not present

## 2019-05-09 DIAGNOSIS — Z7984 Long term (current) use of oral hypoglycemic drugs: Secondary | ICD-10-CM | POA: Diagnosis not present

## 2019-05-09 DIAGNOSIS — Z471 Aftercare following joint replacement surgery: Secondary | ICD-10-CM | POA: Diagnosis not present

## 2019-05-09 DIAGNOSIS — E66811 Obesity, class 1: Secondary | ICD-10-CM

## 2019-05-09 DIAGNOSIS — I2510 Atherosclerotic heart disease of native coronary artery without angina pectoris: Secondary | ICD-10-CM | POA: Diagnosis not present

## 2019-05-09 DIAGNOSIS — Z7982 Long term (current) use of aspirin: Secondary | ICD-10-CM | POA: Diagnosis not present

## 2019-05-09 DIAGNOSIS — E1165 Type 2 diabetes mellitus with hyperglycemia: Secondary | ICD-10-CM

## 2019-05-09 DIAGNOSIS — E785 Hyperlipidemia, unspecified: Secondary | ICD-10-CM | POA: Diagnosis not present

## 2019-05-09 DIAGNOSIS — M1611 Unilateral primary osteoarthritis, right hip: Secondary | ICD-10-CM | POA: Diagnosis not present

## 2019-05-09 DIAGNOSIS — E1159 Type 2 diabetes mellitus with other circulatory complications: Secondary | ICD-10-CM

## 2019-05-09 DIAGNOSIS — Z79899 Other long term (current) drug therapy: Secondary | ICD-10-CM

## 2019-05-09 MED ORDER — MONTELUKAST SODIUM 10 MG PO TABS: 10.0000 mg | ORAL_TABLET | Freq: Every day | ORAL | 3 refills | Status: DC

## 2019-05-09 NOTE — Patient Instructions (Signed)
   We are going to change the way you dose your insulin.  NPH 70/30 inject 40units in the morning with breakfast.   In the evening inject 30units with dinner.  If you continue to have lows decrease the amount of insulin you give and contact our office.

## 2019-05-11 DIAGNOSIS — E230 Hypopituitarism: Secondary | ICD-10-CM | POA: Diagnosis not present

## 2019-05-12 DIAGNOSIS — Z96641 Presence of right artificial hip joint: Secondary | ICD-10-CM | POA: Diagnosis not present

## 2019-05-12 DIAGNOSIS — E559 Vitamin D deficiency, unspecified: Secondary | ICD-10-CM | POA: Diagnosis not present

## 2019-05-12 DIAGNOSIS — Z471 Aftercare following joint replacement surgery: Secondary | ICD-10-CM | POA: Diagnosis not present

## 2019-05-12 DIAGNOSIS — K219 Gastro-esophageal reflux disease without esophagitis: Secondary | ICD-10-CM | POA: Diagnosis not present

## 2019-05-12 DIAGNOSIS — Z87891 Personal history of nicotine dependence: Secondary | ICD-10-CM | POA: Diagnosis not present

## 2019-05-12 DIAGNOSIS — Z9181 History of falling: Secondary | ICD-10-CM | POA: Diagnosis not present

## 2019-05-12 DIAGNOSIS — Z7982 Long term (current) use of aspirin: Secondary | ICD-10-CM | POA: Diagnosis not present

## 2019-05-12 DIAGNOSIS — E1169 Type 2 diabetes mellitus with other specified complication: Secondary | ICD-10-CM | POA: Diagnosis not present

## 2019-05-12 DIAGNOSIS — E785 Hyperlipidemia, unspecified: Secondary | ICD-10-CM | POA: Diagnosis not present

## 2019-05-12 DIAGNOSIS — I10 Essential (primary) hypertension: Secondary | ICD-10-CM | POA: Diagnosis not present

## 2019-05-12 DIAGNOSIS — Z7984 Long term (current) use of oral hypoglycemic drugs: Secondary | ICD-10-CM | POA: Diagnosis not present

## 2019-05-12 DIAGNOSIS — I2510 Atherosclerotic heart disease of native coronary artery without angina pectoris: Secondary | ICD-10-CM | POA: Diagnosis not present

## 2019-05-12 DIAGNOSIS — E1142 Type 2 diabetes mellitus with diabetic polyneuropathy: Secondary | ICD-10-CM | POA: Diagnosis not present

## 2019-05-12 DIAGNOSIS — K760 Fatty (change of) liver, not elsewhere classified: Secondary | ICD-10-CM | POA: Diagnosis not present

## 2019-05-15 DIAGNOSIS — Z7984 Long term (current) use of oral hypoglycemic drugs: Secondary | ICD-10-CM | POA: Diagnosis not present

## 2019-05-15 DIAGNOSIS — Z9181 History of falling: Secondary | ICD-10-CM | POA: Diagnosis not present

## 2019-05-15 DIAGNOSIS — Z96641 Presence of right artificial hip joint: Secondary | ICD-10-CM | POA: Diagnosis not present

## 2019-05-15 DIAGNOSIS — Z471 Aftercare following joint replacement surgery: Secondary | ICD-10-CM | POA: Diagnosis not present

## 2019-05-15 DIAGNOSIS — Z87891 Personal history of nicotine dependence: Secondary | ICD-10-CM | POA: Diagnosis not present

## 2019-05-15 DIAGNOSIS — I2510 Atherosclerotic heart disease of native coronary artery without angina pectoris: Secondary | ICD-10-CM | POA: Diagnosis not present

## 2019-05-15 DIAGNOSIS — K219 Gastro-esophageal reflux disease without esophagitis: Secondary | ICD-10-CM | POA: Diagnosis not present

## 2019-05-15 DIAGNOSIS — K760 Fatty (change of) liver, not elsewhere classified: Secondary | ICD-10-CM | POA: Diagnosis not present

## 2019-05-15 DIAGNOSIS — E559 Vitamin D deficiency, unspecified: Secondary | ICD-10-CM | POA: Diagnosis not present

## 2019-05-15 DIAGNOSIS — E1142 Type 2 diabetes mellitus with diabetic polyneuropathy: Secondary | ICD-10-CM | POA: Diagnosis not present

## 2019-05-15 DIAGNOSIS — E785 Hyperlipidemia, unspecified: Secondary | ICD-10-CM | POA: Diagnosis not present

## 2019-05-15 DIAGNOSIS — Z7982 Long term (current) use of aspirin: Secondary | ICD-10-CM | POA: Diagnosis not present

## 2019-05-15 DIAGNOSIS — I10 Essential (primary) hypertension: Secondary | ICD-10-CM | POA: Diagnosis not present

## 2019-05-15 DIAGNOSIS — E1169 Type 2 diabetes mellitus with other specified complication: Secondary | ICD-10-CM | POA: Diagnosis not present

## 2019-05-17 DIAGNOSIS — I10 Essential (primary) hypertension: Secondary | ICD-10-CM | POA: Diagnosis not present

## 2019-05-17 DIAGNOSIS — Z9181 History of falling: Secondary | ICD-10-CM | POA: Diagnosis not present

## 2019-05-17 DIAGNOSIS — K219 Gastro-esophageal reflux disease without esophagitis: Secondary | ICD-10-CM | POA: Diagnosis not present

## 2019-05-17 DIAGNOSIS — I2510 Atherosclerotic heart disease of native coronary artery without angina pectoris: Secondary | ICD-10-CM | POA: Diagnosis not present

## 2019-05-17 DIAGNOSIS — E1169 Type 2 diabetes mellitus with other specified complication: Secondary | ICD-10-CM | POA: Diagnosis not present

## 2019-05-17 DIAGNOSIS — Z7982 Long term (current) use of aspirin: Secondary | ICD-10-CM | POA: Diagnosis not present

## 2019-05-17 DIAGNOSIS — Z87891 Personal history of nicotine dependence: Secondary | ICD-10-CM | POA: Diagnosis not present

## 2019-05-17 DIAGNOSIS — E785 Hyperlipidemia, unspecified: Secondary | ICD-10-CM | POA: Diagnosis not present

## 2019-05-17 DIAGNOSIS — Z471 Aftercare following joint replacement surgery: Secondary | ICD-10-CM | POA: Diagnosis not present

## 2019-05-17 DIAGNOSIS — E559 Vitamin D deficiency, unspecified: Secondary | ICD-10-CM | POA: Diagnosis not present

## 2019-05-17 DIAGNOSIS — E1142 Type 2 diabetes mellitus with diabetic polyneuropathy: Secondary | ICD-10-CM | POA: Diagnosis not present

## 2019-05-17 DIAGNOSIS — K760 Fatty (change of) liver, not elsewhere classified: Secondary | ICD-10-CM | POA: Diagnosis not present

## 2019-05-17 DIAGNOSIS — Z96641 Presence of right artificial hip joint: Secondary | ICD-10-CM | POA: Diagnosis not present

## 2019-05-17 DIAGNOSIS — Z7984 Long term (current) use of oral hypoglycemic drugs: Secondary | ICD-10-CM | POA: Diagnosis not present

## 2019-05-23 DIAGNOSIS — Z96641 Presence of right artificial hip joint: Secondary | ICD-10-CM | POA: Diagnosis not present

## 2019-05-23 DIAGNOSIS — M25651 Stiffness of right hip, not elsewhere classified: Secondary | ICD-10-CM | POA: Diagnosis not present

## 2019-05-29 DIAGNOSIS — M25651 Stiffness of right hip, not elsewhere classified: Secondary | ICD-10-CM | POA: Diagnosis not present

## 2019-05-29 DIAGNOSIS — Z96641 Presence of right artificial hip joint: Secondary | ICD-10-CM | POA: Diagnosis not present

## 2019-06-06 DIAGNOSIS — M25512 Pain in left shoulder: Secondary | ICD-10-CM | POA: Diagnosis not present

## 2019-06-06 DIAGNOSIS — Z96641 Presence of right artificial hip joint: Secondary | ICD-10-CM | POA: Diagnosis not present

## 2019-06-06 DIAGNOSIS — M25651 Stiffness of right hip, not elsewhere classified: Secondary | ICD-10-CM | POA: Diagnosis not present

## 2019-06-08 DIAGNOSIS — M25651 Stiffness of right hip, not elsewhere classified: Secondary | ICD-10-CM | POA: Diagnosis not present

## 2019-06-08 DIAGNOSIS — Z96641 Presence of right artificial hip joint: Secondary | ICD-10-CM | POA: Diagnosis not present

## 2019-06-12 DIAGNOSIS — M25612 Stiffness of left shoulder, not elsewhere classified: Secondary | ICD-10-CM | POA: Diagnosis not present

## 2019-06-12 DIAGNOSIS — M19012 Primary osteoarthritis, left shoulder: Secondary | ICD-10-CM | POA: Diagnosis not present

## 2019-06-12 NOTE — Progress Notes (Signed)
MEDICARE ANNUAL WELLNESS VISIT AND FOLLOW UP Assessment:    Encounter for Medicare annual wellness exam 1 year  Essential hypertension - continue medications, DASH diet, exercise and monitor at home. Call if greater than 130/80.  -     CBC with Differential/Platelet -     CMP/GFR  Type 2 diabetes mellitus with stage 2 chronic kidney disease, with long-term current use of insulin [facility]) Education: Reviewed 'ABCs' of diabetes management (respective goals in parentheses):  A1C (<7.5), blood pressure (<130/80), and cholesterol (LDL <70) Eye Exam yearly and Dental Exam every 6 months. Dietary recommendations Physical Activity recommendations -     Hemoglobin A1c   ASCAD s/p PTCA (06/2012) Control blood pressure, cholesterol, glucose, increase exercise.  Followed by cardiology  Gastroesophageal reflux disease without esophagitis Continue PPI/H2 blocker, diet discussed  Diabetic peripheral neuropathy (HCC) Check feet daily -     Hemoglobin A1c  Diabetic nephropathy Increase fluids, avoid NSAIDS, control sugars/BP, will monitor  Thyroiditis Monitor, normal scan 2017, followed by Dr. Cruzita Lederer -     TSH  Mixed hyperlipidemia -continue medications, check lipids, decrease fatty foods, increase activity.  -     Lipid panel  Medication management  Obesity Long discussion about weight loss, diet, and exercise Recommended diet heavy in fruits and veggies and low in animal meats, cheeses, and dairy products, appropriate calorie intake Patient will work on walking daily 10-15 min, twice a day if can, with wife Discussed appropriate weight for height  Follow up at next visit  Atherosclerosis of aorta ([place]) Control blood pressure, cholesterol, glucose, increase exercise.   Fatty infiltration of liver Weight loss advised, avoid alcohol/tylenol, will monitor LFTs  Prostate cancer Followed by Dr. Junious Silk, treated by radiation, recently completed lupron  Former smoker (37 pack  years, quit in 2012) Neg CXR 2020; never had low dose CT; discussed but declines at this time, interested next year  Hip arthritis S/p R hip arthroplasty by Dr. Berenice Primas in process with PT, has upcoming follow up  Over 30 minutes of exam, counseling, chart review, and critical decision making was performed  Future Appointments  Date Time Provider [place]  09/13/2019  2:30 PM [name], MD [facility] None  04/11/2020 10:00 AM [name], MD [facility] None     Plan:   During the course of the visit the patient was educated and counseled about appropriate screening and preventive services including:    Pneumococcal vaccine   Influenza vaccine  Prevnar 13  Td vaccine  Screening electrocardiogram  Colorectal cancer screening  Diabetes screening  Glaucoma screening  Nutrition counseling    Subjective:  Michael Guzman is a 73 y.o. male who presents accompanied by his wife for Medicare Annual Wellness Visit and 3 month follow up for HTN, hyperlipidemia, T2 diabetes, and vitamin D Def.   He recently underwent R hip total replacement by Dr. Berenice Primas due to chronic limiting hip pain; he is completing PT at home by himself with instructions for guidance. He has followed up with ortho in 2 weeks or so.   He has hx of ASCAD s/p PTCA (06/2012), last stress test 10/2018 was normal obtained for surgical clearance, followed by Dr. Marlou Porch;   Former smoker, 37.5 pack year history, quit in 2012; last CXR 11/2018; has never had CT lung screening; discussed today, interested in some point but not this year.   hx of prostate cancer treated by radiation in 2016-2017; he is followed by Dr. Eskridge q39m has scheduled in 06/2019, recently completed  lupron shots this past April.   Some concern of ongoing depression; currently treated by wellbutrin 300 mg daily.. Patient states he is not depressed. He reports hasn't been able to fish due to right hip pain and is somewhat  down due to this, but reports otherwise his mood is fine. Encouraged him to resume fishing and other previous activities.   BMI is Body mass index is 30.86 kg/m., he has been working on diet, eating less, PT 2 hours per week and walking and doing strength exercises daily.  Wt Readings from Last 3 Encounters:  06/14/19 209 lb (94.8 kg)  05/09/19 202 lb (91.6 kg)  04/30/19 224 lb (101.6 kg)   His blood pressure has been controlled at home, he is on toprol XL 1.25 mg daily only, day their BP is BP: 112/78 He does workout. He denies chest pain, shortness of breath, dizziness.   He is on cholesterol medication Atorvastatin 20 mg daily and denies myalgias. His cholesterol is at goal. The cholesterol last visit was:   Lab Results  Component Value Date   CHOL 99 03/07/2019   HDL 33 (L) 03/07/2019   LDLCALC 40 03/07/2019   LDLDIRECT 45.7 10/30/2013   TRIG 184 (H) 03/07/2019   CHOLHDL 3.0 03/07/2019   He has been working on diet and exercise for Diabetes with diabetic chronic kidney disease, with other circulatory complications and with diabetic polyneuropathy , he is on bASA, he is not on ACE/ARB due to low BPS, sugars in the AM are 100-130, rare up to 140s,  Currently taking novolin 50 units AM, reports adjusts evening insulin, 0-50 units, cannot describe how he decides how much to take. Reports hasn't taken much in the evening since discharge, has remained well controlled.  Also on metformin 2000 mg daily. He denies hypoglycemia , polydipsia and polyuria. He has gabapentin prescribed for intermittent LE parasthesias, takes 800 mg QID PRN, none in the last month.  Last A1C was:  Lab Results  Component Value Date   HGBA1C 7.1 (H) 03/07/2019   Lab Results  Component Value Date   GFRAA >60 04/30/2019   He has a history of thyroiditis, TSHs have been monitored closely and remained in normal range.   Lab Results  Component Value Date   TSH 0.72 03/07/2019  . Patient is on Vitamin D  supplement.   Lab Results  Component Value Date   VD25OH 93 03/07/2019       Medication Review: Current Outpatient Medications on File Prior to Visit  Medication Sig Dispense Refill  . Ascorbic Acid (VITAMIN C) 100 MG tablet Take 100 mg by mouth daily.    Marland Kitchen aspirin EC 325 MG tablet Take 1 tablet (325 mg total) by mouth 2 (two) times daily after a meal. Take x 1 month post op to decrease risk of blood clots. 60 tablet 0  . atorvastatin (LIPITOR) 20 MG tablet TAKE 1 TABLET BY MOUTH  DAILY (Patient taking differently: Take 20 mg by mouth daily. ) 90 tablet 2  . Blood Glucose Monitoring Suppl (ONE TOUCH ULTRA 2) w/Device KIT Check blood sugar 1 time daily-DX-E11.22 1 kit 0  . Blood Glucose Monitoring Suppl (ONE TOUCH ULTRA SYSTEM KIT) w/Device KIT Check blood sugar 1 time daily-DX-E11.22 1 each 0  . buPROPion (WELLBUTRIN XL) 300 MG 24 hr tablet TAKE 1 TABLET BY MOUTH  EVERY MORNING (Patient taking differently: Take 300 mg by mouth daily. ) 90 tablet 3  . Cholecalciferol (VITAMIN D3) 125 MCG (5000 UT)  CAPS Take 10,000 Units by mouth daily.     Marland Kitchen docusate sodium (COLACE) 100 MG capsule Take 1 capsule (100 mg total) by mouth 2 (two) times daily. 30 capsule 0  . furosemide (LASIX) 40 MG tablet Take 1 tablet 2 x/ day as needed  for fluid retention (Patient taking differently: Take 40 mg by mouth 2 (two) times daily as needed for fluid. ) 60 tablet 1  . gabapentin (NEURONTIN) 800 MG tablet TAKE 1 TABLET BY MOUTH 3 TO 4 TIMES DAILY FOR PAIN, HOT FLASHES AND SWEATS (Patient taking differently: Take 1,600 mg by mouth 2 (two) times daily. ) 360 tablet 3  . glucose blood (ONE TOUCH ULTRA TEST) test strip USE WITH METER TO CHECK  BLOOD SUGAR 2 TIMES DAILY 200 each 3  . glucose blood (ONETOUCH ULTRA) test strip Check blood sugar 3 times a day.DX-E11.22 300 each 1  . insulin NPH-regular Human (70-30) 100 UNIT/ML injection Inject 100 Units into the skin daily.    . Magnesium 250 MG TABS Take 250 mg by mouth  daily.     . metFORMIN (GLUCOPHAGE-XR) 500 MG 24 hr tablet TAKE 1 TO 2 TABLETS BY  MOUTH TWO TIMES DAILY AS  DIRECTED FOR DIABETES (Patient taking differently: Take 1,000 mg by mouth 2 (two) times daily with a meal. ) 360 tablet 3  . metoprolol succinate (TOPROL-XL) 25 MG 24 hr tablet TAKE ONE-HALF TABLET BY  MOUTH DAILY WITH OR  IMMEDIATELY FOLLOWING A  MEAL. (Patient taking differently: Take 12.5 mg by mouth daily. ) 45 tablet 3  . montelukast (SINGULAIR) 10 MG tablet Take 1 tablet (10 mg total) by mouth at bedtime. 90 tablet 3  . Multiple Vitamin (MULTIVITAMIN WITH MINERALS) TABS tablet Take 1 tablet by mouth daily.    . nitroGLYCERIN (NITROSTAT) 0.4 MG SL tablet PLACE 1 TABLET UNDER THE TONGUE EVERY 5 MINUTES AS NEEDED FOR CHEST PAIN (Patient taking differently: Place 0.4 mg under the tongue every 5 (five) minutes as needed for chest pain. PLACE 1 TABLET UNDER THE TONGUE EVERY 5 MINUTES AS NEEDED FOR CHEST PAIN) 25 tablet 6  . ondansetron (ZOFRAN) 4 MG tablet Take 1 tablet (4 mg total) by mouth daily as needed for nausea or vomiting. 30 tablet 1  . OneTouch Delica Lancets 35T MISC Check blood sugar 3 times a day-DX-E11.22 300 each 0  . Polyethyl Glycol-Propyl Glycol (SYSTANE) 0.4-0.3 % SOLN Place 2 drops into both eyes daily as needed (for dry eyes).    . tamsulosin (FLOMAX) 0.4 MG CAPS capsule Take 0.4 mg by mouth at bedtime.    . Testosterone 20.25 MG/1.25GM (1.62%) GEL Place 1 packet onto the skin daily.    Marland Kitchen tiZANidine (ZANAFLEX) 2 MG tablet Take 1 tablet (2 mg total) by mouth every 8 (eight) hours as needed for muscle spasms. 40 tablet 0  . traMADol (ULTRAM) 50 MG tablet Take 1-2 tablets (50-100 mg total) by mouth every 6 (six) hours as needed for moderate pain. 40 tablet 0  . Zinc 30 MG TABS Take 30 mg by mouth daily.     No current facility-administered medications on file prior to visit.     Allergies: No Known Allergies  Current Problems (verified) has Hypertension (1991); GERD;  ASCAD s/p PTCA (06/2012); Hyperlipidemia associated with type 2 diabetes mellitus ([place]); Vitamin D deficiency; Medication management; Diabetic peripheral neuropathy ([place]); Malignant neoplasm of prostate ([place]); Thyroiditis; Obesity (BMI 30.0-34.9); Type 2 diabetes mellitus with vascular disease ([place]); Aortic atherosclerosis ([place]); Microalbuminuric diabetic  nephropathy ([place]); Hypertriglyceridemia; Fatty infiltration of liver; Former smoker (30+ pack years, quit in 2012); and Primary osteoarthritis of hip on their problem list.  Screening Tests Immunization History  Administered Date(s) Administered  . Influenza Split 06/26/2012  . Influenza, High Dose Seasonal PF 06/20/2015, 06/23/2016, 06/14/2017  . Influenza-Unspecified 06/14/2013, 06/25/2014, 06/03/2018  . Pneumococcal Conjugate-13 04/23/2016  . Pneumococcal Polysaccharide-23 06/14/2017  . Pneumococcal-Unspecified 06/16/2010  . Td 09/17/2003  . Tdap 11/26/2013  . Zoster 07/12/2012    Preventative care: Last colonoscopy: 2011 due 2021 with Dr. Cristina Gong Echo 01/2018 Stress test 2017, 10/2018 Bone scan 04/2016 Thyroid 2017 Ct head 2018  Last CXR 11/2018 former smoker, declines CT today but will consider   Prior vaccinations: TD or Tdap: 2015  Influenza: 2019 DUE, given today   Pneumococcal: 2011, 2018 Prevnar13: 2017 Shingles/Zostavax: 2013  Names of Other Physician/Practitioners you currently use: 1. [facility] here for primary care 2. Dr. Gershon Crane, eye doctor, last visit August 07/2019 - report verified and abstracted, goes 3 times per year 3. [facility], dentist, last visit 2020  Patient Care Team: [name], MD as PCP - General (Internal Medicine) Jerline Pain, MD as PCP - Cardiology (Cardiology)  Surgical: He  has a past surgical history that includes Appendectomy; Cardiac surgery (~2017); left heart catheterization with coronary angiogram (N/A, 06/27/2012); left  heart catheterization with coronary angiogram (N/A, 07/25/2012); Colonoscopy (02/12/2010); Radiology with anesthesia (N/A, 12/03/2015); Prostate biopsy; and Total hip arthroplasty (Right, 04/28/2019). Family His family history includes Diabetes in his sister. Social history  He reports that he quit smoking about 7 years ago. He started smoking about 60 years ago. He has a 37.50 pack-year smoking history. He has never used smokeless tobacco. He reports previous alcohol use. He reports that he does not use drugs.  MEDICARE WELLNESS OBJECTIVES: Physical activity: Current Exercise Habits: Home exercise routine, Type of exercise: strength training/weights;stretching;walking, Time (Minutes): 30, Frequency (Times/Week): 7, Weekly Exercise (Minutes/Week): 210, Intensity: Mild, Exercise limited by: orthopedic condition(s) Cardiac risk factors: Cardiac Risk Factors include: advanced age (>59mn, >>61women);diabetes mellitus;dyslipidemia;hypertension;male gender;obesity (BMI >30kg/m2);sedentary lifestyle;smoking/ tobacco exposure Depression/mood screen:   [facility]  Decreased Interest 1  Down, Depressed, Hopeless 1  PHQ - 2 Score 2  Altered sleeping 0  Tired, decreased energy 1  Change in appetite 0  Feeling bad or failure about yourself  0  Trouble concentrating 0  Moving slowly or fidgety/restless 0  Suicidal thoughts 0  PHQ-9 Score 3  Difficult doing work/chores Not difficult at all  Some recent data might be hidden    ADLs:  In your present state of health, do you have any difficulty performing the following activities: 06/14/2019 04/29/2019  Hearing? N N  Vision? N N  Difficulty concentrating or making decisions? N N  Walking or climbing stairs? Y Y  Comment recovering from hip surgery; can manage around home with cane -  Dressing or bathing? N N  Doing errands, shopping? N N  Some recent data might be hidden     Cognitive Testing  Alert? Yes  Normal  Appearance?Yes  Oriented to person? Yes  Place? Yes   Time? Yes  Recall of three objects?  Yes  Can perform simple calculations? Yes  Displays appropriate judgment?Yes  Can read the correct time from a watch face?Yes  EOL planning: Does Patient Have a Medical Advance Directive?: Yes Type of Advance Directive: Healthcare Power of Attorney, Living will Does patient want to make  changes to medical advance directive?: No - Patient declined Copy of [place] in Chart?: Yes - validated most recent copy scanned in chart (See row information)   Objective:   Today's Vitals   06/14/19 1123  BP: 112/78  Pulse: 75  Temp: (!) 96.3 F (35.7 C)  SpO2: 98%  Weight: 209 lb (94.8 kg)  Height: 5' 9"  (1.753 m)   Body mass index is 30.86 kg/m.  General appearance: alert, no distress, WD/WN, male HEENT: normocephalic, sclerae anicteric, TMs pearly, nares patent, no discharge or erythema, pharynx normal Oral cavity: MMM, no lesions Neck: supple, no lymphadenopathy, no thyromegaly, no masses Heart: RRR, normal S1, S2, no murmurs Lungs: CTA bilaterally, no wheezes, rhonchi, or rales Abdomen: +bs, soft, non tender, non distended, no masses, no hepatomegaly, no splenomegaly Musculoskeletal: nontender, no swelling, no obvious deformity; slow sitting to standing, slow steady gait with cane  Extremities: no edema, no cyanosis, no clubbing Pulses: 2+ symmetric, upper and lower extremities, normal cap refill Neurological: alert, oriented x 3, CN2-12 intact, strength normal upper extremities and lower extremities, sensation decreased bilateral feet, DTRs 2+ throughout, no cerebellar signs Psychiatric: depressed affect, behavior normal  Medicare Attestation I have personally reviewed: The patient's medical and social history Their use of alcohol, tobacco or illicit drugs Their current medications and supplements The patient's functional ability including ADLs,fall risks, home safety  risks, cognitive, and hearing and visual impairment Diet and physical activities Evidence for depression or mood disorders  The patient's weight, height, BMI, and visual acuity have been recorded in the chart.  I have made referrals, counseling, and provided education to the patient based on review of the above and I have provided the patient with a written personalized care plan for preventive services.     Izora Ribas, NP   06/14/2019

## 2019-06-14 ENCOUNTER — Encounter: Payer: Self-pay | Admitting: Adult Health

## 2019-06-14 ENCOUNTER — Other Ambulatory Visit: Payer: Self-pay

## 2019-06-14 ENCOUNTER — Other Ambulatory Visit: Payer: Self-pay | Admitting: Adult Health

## 2019-06-14 ENCOUNTER — Ambulatory Visit (INDEPENDENT_AMBULATORY_CARE_PROVIDER_SITE_OTHER): Payer: Medicare Other | Admitting: Adult Health

## 2019-06-14 VITALS — BP 112/78 | HR 75 | Temp 96.3°F | Ht 69.0 in | Wt 209.0 lb

## 2019-06-14 DIAGNOSIS — K219 Gastro-esophageal reflux disease without esophagitis: Secondary | ICD-10-CM | POA: Diagnosis not present

## 2019-06-14 DIAGNOSIS — Z23 Encounter for immunization: Secondary | ICD-10-CM

## 2019-06-14 DIAGNOSIS — Z0001 Encounter for general adult medical examination with abnormal findings: Secondary | ICD-10-CM | POA: Diagnosis not present

## 2019-06-14 DIAGNOSIS — Z79899 Other long term (current) drug therapy: Secondary | ICD-10-CM

## 2019-06-14 DIAGNOSIS — Z0000 Encounter for general adult medical examination without abnormal findings: Secondary | ICD-10-CM

## 2019-06-14 DIAGNOSIS — C61 Malignant neoplasm of prostate: Secondary | ICD-10-CM

## 2019-06-14 DIAGNOSIS — E1159 Type 2 diabetes mellitus with other circulatory complications: Secondary | ICD-10-CM | POA: Diagnosis not present

## 2019-06-14 DIAGNOSIS — I2510 Atherosclerotic heart disease of native coronary artery without angina pectoris: Secondary | ICD-10-CM

## 2019-06-14 DIAGNOSIS — Z87891 Personal history of nicotine dependence: Secondary | ICD-10-CM

## 2019-06-14 DIAGNOSIS — E66811 Obesity, class 1: Secondary | ICD-10-CM

## 2019-06-14 DIAGNOSIS — I700 Atherosclerosis of aorta: Secondary | ICD-10-CM

## 2019-06-14 DIAGNOSIS — E785 Hyperlipidemia, unspecified: Secondary | ICD-10-CM | POA: Diagnosis not present

## 2019-06-14 DIAGNOSIS — K760 Fatty (change of) liver, not elsewhere classified: Secondary | ICD-10-CM

## 2019-06-14 DIAGNOSIS — E1169 Type 2 diabetes mellitus with other specified complication: Secondary | ICD-10-CM

## 2019-06-14 DIAGNOSIS — E669 Obesity, unspecified: Secondary | ICD-10-CM

## 2019-06-14 DIAGNOSIS — E1121 Type 2 diabetes mellitus with diabetic nephropathy: Secondary | ICD-10-CM

## 2019-06-14 DIAGNOSIS — R6889 Other general symptoms and signs: Secondary | ICD-10-CM | POA: Diagnosis not present

## 2019-06-14 DIAGNOSIS — E559 Vitamin D deficiency, unspecified: Secondary | ICD-10-CM

## 2019-06-14 DIAGNOSIS — Z9861 Coronary angioplasty status: Secondary | ICD-10-CM

## 2019-06-14 DIAGNOSIS — E781 Pure hyperglyceridemia: Secondary | ICD-10-CM

## 2019-06-14 DIAGNOSIS — I10 Essential (primary) hypertension: Secondary | ICD-10-CM

## 2019-06-14 DIAGNOSIS — E069 Thyroiditis, unspecified: Secondary | ICD-10-CM

## 2019-06-14 DIAGNOSIS — E1142 Type 2 diabetes mellitus with diabetic polyneuropathy: Secondary | ICD-10-CM

## 2019-06-14 DIAGNOSIS — M1610 Unilateral primary osteoarthritis, unspecified hip: Secondary | ICD-10-CM

## 2019-06-14 NOTE — Patient Instructions (Addendum)
Michael Guzman , Thank you for taking time to come for your Medicare Wellness Visit. I appreciate your ongoing commitment to your health goals. Please review the following plan we discussed and let me know if I can assist you in the future.   These are the goals we discussed: Goals    . Exercise 150 min/wk Moderate Activity    . HEMOGLOBIN A1C < 7.5    . Weight (lb) < 200 lb (90.7 kg)       This is a list of the screening recommended for you and due dates:  Health Maintenance  Topic Date Due  . Flu Shot  04/15/2019  . Hemoglobin A1C  09/06/2019  . Colon Cancer Screening  02/13/2020  . Complete foot exam   03/06/2020  . Urine Protein Check  03/06/2020  . Eye exam for diabetics  04/24/2020  . Tetanus Vaccine  11/27/2023  .  Hepatitis C: One time screening is recommended by Center for Disease Control  (CDC) for  adults born from [birth year] through 1964.   Completed  . Pneumonia vaccines  Completed       Lung Cancer Screening A lung cancer screening is a test that checks for lung cancer. Lung cancer screening is done to look for lung cancer in its very early stages, before it spreads and becomes harder to treat and before symptoms appear. Finding cancer early improves the chances of successful treatment. It may save your life. Should I be screened for lung cancer? You should be screened for lung cancer if all of these apply:  You currently smoke or you have quit smoking within the past 15 years.  You are 36-66 years old. Screening may be recommended up to age 80 depending on your overall health and other factors.  You are in good general health.  You have a smoking history of 1 pack a day for 30 years or 2 packs a day for 15 years. Screening may also be recommended if you are at high risk for the disease. You may be at high risk if:  You have a family history of lung cancer.  You have been exposed to asbestos.  You have chronic obstructive pulmonary disease (COPD).  You have a  history of previous lung cancer. How often should I be screened for lung cancer?  If you are at risk for lung cancer, it is recommended that you are screened once a year. The recommended screening test is a low-dose CT scan. How can I lower my risk of lung cancer? To lower your risk of developing lung cancer:  If you smoke, stop smoking all tobacco products.  Avoid secondhand smoke.  Avoid exposure to radiation.  Avoid exposure to radon gas. Have your home checked for radon regularly.  Avoid things that cause cancer (carcinogens).  Avoid living or working in places with high air pollution. Where to find more information Ask your health care provider about the risks and benefits of screening. More information and resources are available from these organizations:  [place] (ACS): www.cancer.org  American Lung Association: www.lung.org Contact a health care provider if:  You start to show symptoms of lung cancer, including: ? Coughing that will not go away. ? Wheezing. ? Chest pain. ? Coughing up blood. ? Shortness of breath. ? Weight loss that cannot be explained. ? Constant fatigue. Summary  Lung cancer screening may find lung cancer before symptoms appear. Finding cancer early improves the chances of successful treatment. It may save your  life.  If you are at risk for lung cancer, it is recommended that you are screened once a year. The recommended screening test is a low-dose CT scan.  You can make lifestyle changes to lower your risk of lung cancer.  Ask your health care provider about the risks and benefits of screening. This information is not intended to replace advice given to you by your health care provider. Make sure you discuss any questions you have with your health care provider. Document Released: 07/22/2016 Document Revised: 12/23/2018 Document Reviewed: 07/22/2016 Elsevier Patient Education  2020 [employer].

## 2019-06-15 DIAGNOSIS — M25651 Stiffness of right hip, not elsewhere classified: Secondary | ICD-10-CM | POA: Diagnosis not present

## 2019-06-15 DIAGNOSIS — Z96641 Presence of right artificial hip joint: Secondary | ICD-10-CM | POA: Diagnosis not present

## 2019-06-15 LAB — CBC WITH DIFFERENTIAL/PLATELET
Absolute Monocytes: 597 cells/uL (ref 200–950)
Basophils Absolute: 52 cells/uL (ref 0–200)
Basophils Relative: 0.9 %
Eosinophils Absolute: 261 cells/uL (ref 15–500)
Eosinophils Relative: 4.5 %
HCT: 40.2 % (ref 38.5–50.0)
Hemoglobin: 13.2 g/dL (ref 13.2–17.1)
Lymphs Abs: 1177 cells/uL (ref 850–3900)
MCH: 29.9 pg (ref 27.0–33.0)
MCHC: 32.8 g/dL (ref 32.0–36.0)
MCV: 91 fL (ref 80.0–100.0)
MPV: 11.3 fL (ref 7.5–12.5)
Monocytes Relative: 10.3 %
Neutro Abs: 3712 cells/uL (ref 1500–7800)
Neutrophils Relative %: 64 %
Platelets: 256 10*3/uL (ref 140–400)
RBC: 4.42 10*6/uL (ref 4.20–5.80)
RDW: 13.3 % (ref 11.0–15.0)
Total Lymphocyte: 20.3 %
WBC: 5.8 10*3/uL (ref 3.8–10.8)

## 2019-06-15 LAB — COMPLETE METABOLIC PANEL WITH GFR
AG Ratio: 1.5 (calc) (ref 1.0–2.5)
ALT: 47 U/L — ABNORMAL HIGH (ref 9–46)
AST: 51 U/L — ABNORMAL HIGH (ref 10–35)
Albumin: 4.6 g/dL (ref 3.6–5.1)
Alkaline phosphatase (APISO): 71 U/L (ref 35–144)
BUN: 12 mg/dL (ref 7–25)
CO2: 25 mmol/L (ref 20–32)
Calcium: 10.5 mg/dL — ABNORMAL HIGH (ref 8.6–10.3)
Chloride: 105 mmol/L (ref 98–110)
Creat: 1.1 mg/dL (ref 0.70–1.18)
GFR, Est African American: 77 mL/min/{1.73_m2} (ref >=60)
GFR, Est Non African American: 67 mL/min/{1.73_m2} (ref >=60)
Globulin: 3 g/dL (calc) (ref 1.9–3.7)
Glucose, Bld: 91 mg/dL (ref 65–99)
Potassium: 4.2 mmol/L (ref 3.5–5.3)
Sodium: 140 mmol/L (ref 135–146)
Total Bilirubin: 0.5 mg/dL (ref 0.2–1.2)
Total Protein: 7.6 g/dL (ref 6.1–8.1)

## 2019-06-15 LAB — LIPID PANEL
Cholesterol: 111 mg/dL (ref <200)
HDL: 44 mg/dL (ref >=40)
LDL Cholesterol (Calc): 42 mg/dL (calc)
Non-HDL Cholesterol (Calc): 67 mg/dL (calc) (ref <130)
Total CHOL/HDL Ratio: 2.5 (calc) (ref <5.0)
Triglycerides: 176 mg/dL — ABNORMAL HIGH (ref <150)

## 2019-06-15 LAB — HEMOGLOBIN A1C
Hgb A1c MFr Bld: 6.2 % of total Hgb — ABNORMAL HIGH (ref <5.7)
Mean Plasma Glucose: 131 (calc)
eAG (mmol/L): 7.3 (calc)

## 2019-06-15 LAB — TSH: TSH: 0.52 mIU/L (ref 0.40–4.50)

## 2019-06-15 LAB — MAGNESIUM: Magnesium: 2 mg/dL (ref 1.5–2.5)

## 2019-06-20 DIAGNOSIS — Z96641 Presence of right artificial hip joint: Secondary | ICD-10-CM | POA: Diagnosis not present

## 2019-06-20 DIAGNOSIS — M25651 Stiffness of right hip, not elsewhere classified: Secondary | ICD-10-CM | POA: Diagnosis not present

## 2019-06-27 DIAGNOSIS — Z96641 Presence of right artificial hip joint: Secondary | ICD-10-CM | POA: Diagnosis not present

## 2019-06-27 DIAGNOSIS — M25651 Stiffness of right hip, not elsewhere classified: Secondary | ICD-10-CM | POA: Diagnosis not present

## 2019-06-29 DIAGNOSIS — M25651 Stiffness of right hip, not elsewhere classified: Secondary | ICD-10-CM | POA: Diagnosis not present

## 2019-06-29 DIAGNOSIS — Z96641 Presence of right artificial hip joint: Secondary | ICD-10-CM | POA: Diagnosis not present

## 2019-07-04 DIAGNOSIS — Z96641 Presence of right artificial hip joint: Secondary | ICD-10-CM | POA: Diagnosis not present

## 2019-07-04 DIAGNOSIS — M25551 Pain in right hip: Secondary | ICD-10-CM | POA: Diagnosis not present

## 2019-07-04 DIAGNOSIS — M25651 Stiffness of right hip, not elsewhere classified: Secondary | ICD-10-CM | POA: Diagnosis not present

## 2019-07-11 DIAGNOSIS — M25651 Stiffness of right hip, not elsewhere classified: Secondary | ICD-10-CM | POA: Diagnosis not present

## 2019-07-11 DIAGNOSIS — Z96641 Presence of right artificial hip joint: Secondary | ICD-10-CM | POA: Diagnosis not present

## 2019-07-13 DIAGNOSIS — M25651 Stiffness of right hip, not elsewhere classified: Secondary | ICD-10-CM | POA: Diagnosis not present

## 2019-07-13 DIAGNOSIS — Z96641 Presence of right artificial hip joint: Secondary | ICD-10-CM | POA: Diagnosis not present

## 2019-07-14 DIAGNOSIS — E230 Hypopituitarism: Secondary | ICD-10-CM | POA: Diagnosis not present

## 2019-07-14 DIAGNOSIS — R350 Frequency of micturition: Secondary | ICD-10-CM | POA: Diagnosis not present

## 2019-07-18 DIAGNOSIS — Z96641 Presence of right artificial hip joint: Secondary | ICD-10-CM | POA: Diagnosis not present

## 2019-07-18 DIAGNOSIS — M25651 Stiffness of right hip, not elsewhere classified: Secondary | ICD-10-CM | POA: Diagnosis not present

## 2019-07-25 DIAGNOSIS — M67911 Unspecified disorder of synovium and tendon, right shoulder: Secondary | ICD-10-CM | POA: Diagnosis not present

## 2019-07-25 DIAGNOSIS — T8484XA Pain due to internal orthopedic prosthetic devices, implants and grafts, initial encounter: Secondary | ICD-10-CM | POA: Diagnosis not present

## 2019-07-25 DIAGNOSIS — M25551 Pain in right hip: Secondary | ICD-10-CM | POA: Diagnosis not present

## 2019-07-27 DIAGNOSIS — Z96641 Presence of right artificial hip joint: Secondary | ICD-10-CM | POA: Diagnosis not present

## 2019-07-27 DIAGNOSIS — M25651 Stiffness of right hip, not elsewhere classified: Secondary | ICD-10-CM | POA: Diagnosis not present

## 2019-08-01 DIAGNOSIS — M25651 Stiffness of right hip, not elsewhere classified: Secondary | ICD-10-CM | POA: Diagnosis not present

## 2019-08-01 DIAGNOSIS — Z96641 Presence of right artificial hip joint: Secondary | ICD-10-CM | POA: Diagnosis not present

## 2019-08-15 DIAGNOSIS — Z96641 Presence of right artificial hip joint: Secondary | ICD-10-CM | POA: Diagnosis not present

## 2019-08-15 DIAGNOSIS — M25651 Stiffness of right hip, not elsewhere classified: Secondary | ICD-10-CM | POA: Diagnosis not present

## 2019-08-16 ENCOUNTER — Other Ambulatory Visit: Payer: Self-pay | Admitting: Internal Medicine

## 2019-08-22 DIAGNOSIS — M25651 Stiffness of right hip, not elsewhere classified: Secondary | ICD-10-CM | POA: Diagnosis not present

## 2019-08-22 DIAGNOSIS — Z96641 Presence of right artificial hip joint: Secondary | ICD-10-CM | POA: Diagnosis not present

## 2019-09-07 DIAGNOSIS — M67911 Unspecified disorder of synovium and tendon, right shoulder: Secondary | ICD-10-CM | POA: Diagnosis not present

## 2019-09-07 DIAGNOSIS — M67912 Unspecified disorder of synovium and tendon, left shoulder: Secondary | ICD-10-CM | POA: Diagnosis not present

## 2019-09-07 DIAGNOSIS — M25551 Pain in right hip: Secondary | ICD-10-CM | POA: Diagnosis not present

## 2019-09-07 DIAGNOSIS — Z96641 Presence of right artificial hip joint: Secondary | ICD-10-CM | POA: Diagnosis not present

## 2019-09-13 ENCOUNTER — Ambulatory Visit (INDEPENDENT_AMBULATORY_CARE_PROVIDER_SITE_OTHER): Payer: Medicare Other | Admitting: Internal Medicine

## 2019-09-13 ENCOUNTER — Other Ambulatory Visit: Payer: Self-pay

## 2019-09-13 ENCOUNTER — Encounter: Payer: Self-pay | Admitting: Internal Medicine

## 2019-09-13 VITALS — BP 128/70 | HR 72 | Temp 97.5°F | Resp 16 | Ht 69.0 in | Wt 214.2 lb

## 2019-09-13 DIAGNOSIS — E785 Hyperlipidemia, unspecified: Secondary | ICD-10-CM | POA: Diagnosis not present

## 2019-09-13 DIAGNOSIS — Z794 Long term (current) use of insulin: Secondary | ICD-10-CM

## 2019-09-13 DIAGNOSIS — I10 Essential (primary) hypertension: Secondary | ICD-10-CM

## 2019-09-13 DIAGNOSIS — E1122 Type 2 diabetes mellitus with diabetic chronic kidney disease: Secondary | ICD-10-CM | POA: Diagnosis not present

## 2019-09-13 DIAGNOSIS — E1169 Type 2 diabetes mellitus with other specified complication: Secondary | ICD-10-CM

## 2019-09-13 DIAGNOSIS — K219 Gastro-esophageal reflux disease without esophagitis: Secondary | ICD-10-CM

## 2019-09-13 DIAGNOSIS — Z9861 Coronary angioplasty status: Secondary | ICD-10-CM

## 2019-09-13 DIAGNOSIS — I2510 Atherosclerotic heart disease of native coronary artery without angina pectoris: Secondary | ICD-10-CM

## 2019-09-13 DIAGNOSIS — E559 Vitamin D deficiency, unspecified: Secondary | ICD-10-CM

## 2019-09-13 DIAGNOSIS — Z79899 Other long term (current) drug therapy: Secondary | ICD-10-CM

## 2019-09-13 DIAGNOSIS — N182 Chronic kidney disease, stage 2 (mild): Secondary | ICD-10-CM

## 2019-09-13 NOTE — Progress Notes (Signed)
History of Present Illness:      This very nice 73 y.o. MBM presents for 6 month follow up with HTN, HLD, T2_NIDDM and Vitamin D Deficiency. Patient has GERD controlled on his meds:          In 2017,. Patient was treated for Prostate Cawith XRT and then Lupron injections and since has c/o severe sweats & hot flashes not improved on Testosterone replacement.       Patient is treated for HTN (1991) & BP has been controlled at home. Today's BP is at goal - 128/70.  In 2013, patient had PCA/Stent. Patient has had no complaints of any cardiac type chest pain, palpitations, dyspnea / orthopnea / PND, dizziness, claudication, or dependent edema.      Hyperlipidemia is controlled with diet & meds. Patient denies myalgias or other med SE's. Last Lipids were at goal except elevated Trig's:  Lab Results  Component Value Date   CHOL 111 06/14/2019   HDL 44 06/14/2019   LDLCALC 42 06/14/2019   LDLDIRECT 45.7 10/30/2013   TRIG 176 (H) 06/14/2019   CHOLHDL 2.5 06/14/2019        Also, the patient has moderate obesity (BMI 33+) and consequent  T2_NIDDM (1998) w/ CKD 2 (GFR 77)  Treated w/Metformin til started on Insulin in 2016. Patient denies  symptoms of reactive hypoglycemia, diabetic polys, paresthesias or visual blurring.  Last A1c was not at goal:  Lab Results  Component Value Date   HGBA1C 6.2 (H) 06/14/2019        Further, the patient also has history of Vitamin D Deficiency ("15" / 2008)  and supplements vitamin D without any suspected side-effects. Last vitamin D was at goal:  Lab Results  Component Value Date   VD25OH 93 03/07/2019    Current Outpatient Medications on File Prior to Visit  Medication Sig  . Ascorbic Acid (VITAMIN C) 100 MG tablet Take 100 mg by mouth daily.  Marland Kitchen aspirin EC 325 MG tablet Take 1 tablet (325 mg total) by mouth 2 (two) times daily after a meal. Take x 1 month post op to decrease risk of blood clots.  Marland Kitchen atorvastatin (LIPITOR) 20 MG tablet TAKE 1  TABLET BY MOUTH  DAILY (Patient taking differently: Take 20 mg by mouth daily. )  . Blood Glucose Monitoring Suppl (ONE TOUCH ULTRA 2) w/Device KIT Check blood sugar 1 time daily-DX-E11.22  . Blood Glucose Monitoring Suppl (ONE TOUCH ULTRA SYSTEM KIT) w/Device KIT Check blood sugar 1 time daily-DX-E11.22  . buPROPion (WELLBUTRIN XL) 300 MG 24 hr tablet TAKE 1 TABLET BY MOUTH  EVERY MORNING (Patient taking differently: Take 300 mg by mouth daily. )  . Cholecalciferol (VITAMIN D3) 125 MCG (5000 UT) CAPS Take 10,000 Units by mouth daily.   Marland Kitchen docusate sodium (COLACE) 100 MG capsule Take 1 capsule (100 mg total) by mouth 2 (two) times daily.  . furosemide (LASIX) 40 MG tablet Take 1 tablet 2 x/ day as needed  for fluid retention (Patient taking differently: Take 40 mg by mouth 2 (two) times daily as needed for fluid. )  . gabapentin (NEURONTIN) 800 MG tablet TAKE 1 TABLET BY MOUTH 3 TO 4 TIMES DAILY FOR PAIN, HOT FLASHES AND SWEATS (Patient taking differently: Take 1,600 mg by mouth 2 (two) times daily. )  . glucose blood (ONE TOUCH ULTRA TEST) test strip USE WITH METER TO CHECK  BLOOD SUGAR 2 TIMES DAILY  . glucose blood (ONETOUCH ULTRA) test  strip Check blood sugar 3 times a day.DX-E11.22  . insulin NPH-regular Human (70-30) 100 UNIT/ML injection Inject 100 Units into the skin daily.  . Lancets (ONETOUCH DELICA PLUS [id number]) MISC CHECK BLOOD SUGAR 3 TIMES  DAILY  . Magnesium 250 MG TABS Take 250 mg by mouth daily.   . metFORMIN (GLUCOPHAGE-XR) 500 MG 24 hr tablet TAKE 1 TO 2 TABLETS BY  MOUTH TWO TIMES DAILY AS  DIRECTED FOR DIABETES (Patient taking differently: Take 1,000 mg by mouth 2 (two) times daily with a meal. )  . metoprolol succinate (TOPROL-XL) 25 MG 24 hr tablet TAKE ONE-HALF TABLET BY  MOUTH DAILY WITH OR  IMMEDIATELY FOLLOWING A  MEAL. (Patient taking differently: Take 12.5 mg by mouth daily. )  . montelukast (SINGULAIR) 10 MG tablet Take 1 tablet (10 mg total) by mouth at bedtime.  .  Multiple Vitamin (MULTIVITAMIN WITH MINERALS) TABS tablet Take 1 tablet by mouth daily.  . nitroGLYCERIN (NITROSTAT) 0.4 MG SL tablet PLACE 1 TABLET UNDER THE TONGUE EVERY 5 MINUTES AS NEEDED FOR CHEST PAIN (Patient taking differently: Place 0.4 mg under the tongue every 5 (five) minutes as needed for chest pain. PLACE 1 TABLET UNDER THE TONGUE EVERY 5 MINUTES AS NEEDED FOR CHEST PAIN)  . ondansetron (ZOFRAN) 4 MG tablet Take 1 tablet (4 mg total) by mouth daily as needed for nausea or vomiting.  . pantoprazole (PROTONIX) 40 MG tablet TAKE 1 TABLET BY MOUTH  DAILY  . Polyethyl Glycol-Propyl Glycol (SYSTANE) 0.4-0.3 % SOLN Place 2 drops into both eyes daily as needed (for dry eyes).  . tamsulosin (FLOMAX) 0.4 MG CAPS capsule Take 0.4 mg by mouth at bedtime.  . traMADol (ULTRAM) 50 MG tablet Take 1-2 tablets (50-100 mg total) by mouth every 6 (six) hours as needed for moderate pain.  . Zinc 30 MG TABS Take 30 mg by mouth daily.  . Testosterone 20.25 MG/1.25GM (1.62%) GEL Place 1 packet onto the skin daily.   No current facility-administered medications on file prior to visit.    No Known Allergies  PMHx:   Past Medical History:  Diagnosis Date  . Arthritis   . CAD (coronary artery disease)    a.s/p DES to mid LAD and OM2 06/2012.  Marland Kitchen Chronic back pain   . Chronically dry eyes   . Diabetes mellitus without complication ([place])    TYPE 2   . DKA (diabetic ketoacidoses) ([place]) 01/30/2017  . Elevated PSA    being monitored  . Fatty liver   . GERD (gastroesophageal reflux disease)   . Hx of radiation therapy    prostate , [facility]   . Hypertension   . Hypertriglyceridemia   . Intermediate coronary syndrome ([place]) 07/26/2012  . Prostate cancer ([place])    RADTIATION    Immunization History  Administered Date(s) Administered  . Influenza Split 06/26/2012  . Influenza, High Dose Seasonal PF 06/20/2015, 06/23/2016, 06/14/2017, 06/14/2019  . Influenza-Unspecified 06/14/2013,  06/25/2014, 06/03/2018  . Pneumococcal Conjugate-13 04/23/2016  . Pneumococcal Polysaccharide-23 06/14/2017  . Pneumococcal-Unspecified 06/16/2010  . Td 09/17/2003  . Tdap 11/26/2013  . Zoster 07/12/2012   Past Surgical History:  Procedure Laterality Date  . APPENDECTOMY    . CARDIAC SURGERY  ~2017   3 stent placed.  . COLONOSCOPY  02/12/2010   [facility]cc[facility]  . LEFT HEART CATHETERIZATION WITH CORONARY ANGIOGRAM N/A 06/27/2012   Procedure: LEFT HEART CATHETERIZATION WITH CORONARY ANGIOGRAM;  Surgeon: Candee Furbish, MD;  Location: [facility] CATH LAB;  Service: Cardiovascular;  Laterality: N/A;  . LEFT HEART CATHETERIZATION WITH CORONARY ANGIOGRAM N/A 07/25/2012   Procedure: LEFT HEART CATHETERIZATION WITH CORONARY ANGIOGRAM;  Surgeon: Sueanne Margarita, MD;  Location: [facility] CATH LAB;  Service: Cardiovascular;  Laterality: N/A;  . PROSTATE BIOPSY    . RADIOLOGY WITH ANESTHESIA N/A 12/03/2015   Procedure: MRI LUMBAR SPINE;  Surgeon: Medication Radiologist, MD;  Location: [facility];  Service: Radiology;  Laterality: N/A;  . TOTAL HIP ARTHROPLASTY Right 04/28/2019   Procedure: TOTAL HIP ARTHROPLASTY ANTERIOR APPROACH;  Surgeon: Dorna Leitz, MD;  Location: [facility];  Service: Orthopedics;  Laterality: Right;    FHx:    Reviewed / unchanged  SHx:    Reviewed / unchanged   Systems Review:  Constitutional: Denies fever, chills, wt changes, headaches, insomnia, fatigue, night sweats, change in appetite. Eyes: Denies redness, blurred vision, diplopia, discharge, itchy, watery eyes.  ENT: Denies discharge, congestion, post nasal drip, epistaxis, sore throat, earache, hearing loss, dental pain, tinnitus, vertigo, sinus pain, snoring.  CV: Denies chest pain, palpitations, irregular heartbeat, syncope, dyspnea, diaphoresis, orthopnea, PND, claudication or edema. Respiratory: denies cough, dyspnea, DOE, pleurisy, hoarseness, laryngitis, wheezing.  Gastrointestinal: Denies dysphagia, odynophagia, heartburn, reflux, water  brash, abdominal pain or cramps, nausea, vomiting, bloating, diarrhea, constipation, hematemesis, melena, hematochezia  or hemorrhoids. Genitourinary: Denies dysuria, frequency, urgency, nocturia, hesitancy, discharge, hematuria or flank pain. Musculoskeletal: Denies arthralgias, myalgias, stiffness, jt. swelling, pain, limping or strain/sprain.  Skin: Denies pruritus, rash, hives, warts, acne, eczema or change in skin lesion(s). Neuro: No weakness, tremor, incoordination, spasms, paresthesia or pain. Psychiatric: Denies confusion, memory loss or sensory loss. Endo: Denies change in weight, skin or hair change.  Heme/Lymph: No excessive bleeding, bruising or enlarged lymph nodes.  Physical Exam  BP 128/70   Pulse 72   Temp (!) 97.5 F (36.4 C)   Resp 16   Ht 5' 9"  (1.753 m)   Wt 214 lb 3.2 oz (97.2 kg)   BMI 31.63 kg/m   Appears  well nourished, well groomed  and in no distress.  Eyes: PERRLA, EOMs, conjunctiva no swelling or erythema. Sinuses: No frontal/maxillary tenderness ENT/Mouth: EAC's clear, TM's nl w/o erythema, bulging. Nares clear w/o erythema, swelling, exudates. Oropharynx clear without erythema or exudates. Oral hygiene is good. Tongue normal, non obstructing. Hearing intact.  Neck: Supple. Thyroid not palpable. Car 2+/2+ without bruits, nodes or JVD. Chest: Respirations nl with BS clear & equal w/o rales, rhonchi, wheezing or stridor.  Cor: Heart sounds normal w/ regular rate and rhythm without sig. murmurs, gallops, clicks or rubs. Peripheral pulses normal and equal  without edema.  Abdomen: Soft & bowel sounds normal. Non-tender w/o guarding, rebound, hernias, masses or organomegaly.  Lymphatics: Unremarkable.  Musculoskeletal: Full ROM all peripheral extremities, joint stability, 5/5 strength and normal gait.  Skin: Warm, dry without exposed rashes, lesions or ecchymosis apparent.  Neuro: Cranial nerves intact, reflexes equal bilaterally. Sensory-motor testing  grossly intact. Tendon reflexes grossly intact.  Pysch: Alert & oriented x 3.  Insight and judgement nl & appropriate. No ideations.  Assessment and Plan:  1. Essential hypertension  - Continue medication, monitor blood pressure at home.  - Continue DASH diet.  Reminder to go to the ER if any CP,  SOB, nausea, dizziness, severe HA, changes vision/speech.  - CBC with Diff - COMPLETE METABOLIC PANEL WITH GFR - Magnesium - TSH  2. Hyperlipidemia associated with type 2 diabetes mellitus ([place])  - Continue diet/meds, exercise,& lifestyle modifications.  - Continue monitor periodic cholesterol/liver & renal  functions   - Lipid Profile - TSH  3. Type 2 diabetes mellitus with stage 2 chronic kidney disease,  with long-term current use of insulin (HCC)  - Hemoglobin A1c ([facility]) - Insulin, random  4. Vitamin D deficiency  - Continue diet, exercise  - Lifestyle modifications.  - Monitor appropriate labs. - Continue supplementation.  - Vitamin D (25 hydroxy)  5. ASCAD s/p PTCA (06/2012)  - Lipid Profile  6. Gastroesophageal reflux disease  - CBC with Diff  7. Medication management  - CBC with Diff - COMPLETE METABOLIC PANEL WITH GFR - Magnesium - Lipid Profile - TSH - Hemoglobin A1c ([facility]) - Insulin, random - Vitamin D (25 hydroxy)        Discussed  regular exercise, BP monitoring, weight control to achieve/maintain BMI less than 25 and discussed med and SE's. Recommended labs to assess and monitor clinical status with further disposition pending results of labs.  I discussed the assessment and treatment plan with the patient. The patient was provided an opportunity to ask questions and all were answered. The patient agreed with the plan and demonstrated an understanding of the instructions.  I provided over 30 minutes of exam, counseling, chart review and  complex critical decision making.  Kirtland Bouchard, MD

## 2019-09-13 NOTE — Patient Instructions (Signed)

## 2019-09-14 LAB — COMPLETE METABOLIC PANEL WITH GFR
AG Ratio: 2 (calc) (ref 1.0–2.5)
ALT: 65 U/L — ABNORMAL HIGH (ref 9–46)
AST: 49 U/L — ABNORMAL HIGH (ref 10–35)
Albumin: 4.5 g/dL (ref 3.6–5.1)
Alkaline phosphatase (APISO): 90 U/L (ref 35–144)
BUN/Creatinine Ratio: 12 (calc) (ref 6–22)
BUN: 15 mg/dL (ref 7–25)
CO2: 25 mmol/L (ref 20–32)
Calcium: 10 mg/dL (ref 8.6–10.3)
Chloride: 104 mmol/L (ref 98–110)
Creat: 1.23 mg/dL — ABNORMAL HIGH (ref 0.70–1.18)
GFR, Est African American: 68 mL/min/{1.73_m2} (ref >=60)
GFR, Est Non African American: 58 mL/min/{1.73_m2} — ABNORMAL LOW (ref >=60)
Globulin: 2.3 g/dL (calc) (ref 1.9–3.7)
Glucose, Bld: 124 mg/dL — ABNORMAL HIGH (ref 65–99)
Potassium: 4.2 mmol/L (ref 3.5–5.3)
Sodium: 140 mmol/L (ref 135–146)
Total Bilirubin: 0.3 mg/dL (ref 0.2–1.2)
Total Protein: 6.8 g/dL (ref 6.1–8.1)

## 2019-09-14 LAB — CBC WITH DIFFERENTIAL/PLATELET
Absolute Monocytes: 856 cells/uL (ref 200–950)
Basophils Absolute: 48 cells/uL (ref 0–200)
Basophils Relative: 0.7 %
Eosinophils Absolute: 173 cells/uL (ref 15–500)
Eosinophils Relative: 2.5 %
HCT: 39.2 % (ref 38.5–50.0)
Hemoglobin: 13.1 g/dL — ABNORMAL LOW (ref 13.2–17.1)
Lymphs Abs: 1511 cells/uL (ref 850–3900)
MCH: 29.2 pg (ref 27.0–33.0)
MCHC: 33.4 g/dL (ref 32.0–36.0)
MCV: 87.5 fL (ref 80.0–100.0)
MPV: 11.8 fL (ref 7.5–12.5)
Monocytes Relative: 12.4 %
Neutro Abs: 4313 cells/uL (ref 1500–7800)
Neutrophils Relative %: 62.5 %
Platelets: 192 10*3/uL (ref 140–400)
RBC: 4.48 10*6/uL (ref 4.20–5.80)
RDW: 13.1 % (ref 11.0–15.0)
Total Lymphocyte: 21.9 %
WBC: 6.9 10*3/uL (ref 3.8–10.8)

## 2019-09-14 LAB — VITAMIN D 25 HYDROXY (VIT D DEFICIENCY, FRACTURES): Vit D, 25-Hydroxy: 98 ng/mL (ref 30–100)

## 2019-09-14 LAB — LIPID PANEL
Cholesterol: 120 mg/dL (ref <200)
HDL: 61 mg/dL (ref >=40)
LDL Cholesterol (Calc): 35 mg/dL (calc)
Non-HDL Cholesterol (Calc): 59 mg/dL (calc) (ref <130)
Total CHOL/HDL Ratio: 2 (calc) (ref <5.0)
Triglycerides: 162 mg/dL — ABNORMAL HIGH (ref <150)

## 2019-09-14 LAB — HEMOGLOBIN A1C
Hgb A1c MFr Bld: 6.9 % of total Hgb — ABNORMAL HIGH (ref <5.7)
Mean Plasma Glucose: 151 (calc)
eAG (mmol/L): 8.4 (calc)

## 2019-09-14 LAB — MAGNESIUM: Magnesium: 1.9 mg/dL (ref 1.5–2.5)

## 2019-09-14 LAB — TSH: TSH: 0.78 mIU/L (ref 0.40–4.50)

## 2019-09-14 LAB — INSULIN, RANDOM: Insulin: 53.3 u[IU]/mL — ABNORMAL HIGH

## 2019-09-23 ENCOUNTER — Other Ambulatory Visit: Payer: Self-pay | Admitting: Cardiology

## 2019-10-13 ENCOUNTER — Other Ambulatory Visit: Payer: Self-pay | Admitting: Internal Medicine

## 2019-10-13 DIAGNOSIS — E119 Type 2 diabetes mellitus without complications: Secondary | ICD-10-CM

## 2019-10-13 DIAGNOSIS — F339 Major depressive disorder, recurrent, unspecified: Secondary | ICD-10-CM

## 2019-10-13 MED ORDER — METFORMIN HCL ER 500 MG PO TB24: ORAL_TABLET | ORAL | 3 refills | Status: DC

## 2019-10-13 MED ORDER — PAROXETINE HCL 20 MG PO TABS: ORAL_TABLET | ORAL | 1 refills | Status: DC

## 2019-10-13 MED ORDER — FUROSEMIDE 40 MG PO TABS: ORAL_TABLET | ORAL | 3 refills | Status: DC

## 2019-10-13 MED ORDER — BUPROPION HCL ER (XL) 300 MG PO TB24: ORAL_TABLET | ORAL | 3 refills | Status: DC

## 2019-10-26 DIAGNOSIS — H401131 Primary open-angle glaucoma, bilateral, mild stage: Secondary | ICD-10-CM | POA: Diagnosis not present

## 2019-10-26 DIAGNOSIS — H2513 Age-related nuclear cataract, bilateral: Secondary | ICD-10-CM | POA: Diagnosis not present

## 2019-10-27 ENCOUNTER — Ambulatory Visit: Payer: Medicare Other | Admitting: Cardiology

## 2019-10-27 ENCOUNTER — Other Ambulatory Visit: Payer: Self-pay

## 2019-10-27 ENCOUNTER — Encounter: Payer: Self-pay | Admitting: Cardiology

## 2019-10-27 VITALS — BP 132/78 | HR 70 | Ht 69.0 in | Wt 217.0 lb

## 2019-10-27 DIAGNOSIS — I700 Atherosclerosis of aorta: Secondary | ICD-10-CM | POA: Diagnosis not present

## 2019-10-27 DIAGNOSIS — I2510 Atherosclerotic heart disease of native coronary artery without angina pectoris: Secondary | ICD-10-CM

## 2019-10-27 DIAGNOSIS — E119 Type 2 diabetes mellitus without complications: Secondary | ICD-10-CM

## 2019-10-27 DIAGNOSIS — E782 Mixed hyperlipidemia: Secondary | ICD-10-CM | POA: Diagnosis not present

## 2019-10-27 DIAGNOSIS — Z9861 Coronary angioplasty status: Secondary | ICD-10-CM

## 2019-10-27 DIAGNOSIS — I10 Essential (primary) hypertension: Secondary | ICD-10-CM

## 2019-10-27 MED ORDER — ASPIRIN EC 81 MG PO TBEC: 81.0000 mg | DELAYED_RELEASE_TABLET | Freq: Every day | ORAL | 3 refills | Status: AC

## 2019-10-27 NOTE — Patient Instructions (Addendum)
Medication Instructions:   CONTINUE TAKING ASPIRIN 81 MG BY MOUTH DAILY  *If you need a refill on your cardiac medications before your next appointment, please call your pharmacy*    Follow-Up: At [facility], you and your health needs are our priority.  As part of our continuing mission to provide you with exceptional heart care, we have created designated Provider Care Teams.  These Care Teams include your primary Cardiologist (physician) and Advanced Practice Providers (APPs -  Physician Assistants and Nurse Practitioners) who all work together to provide you with the care you need, when you need it.  Your next appointment:   12 month(s)  The format for your next appointment:   In Person  Provider:   Dr. Candee Furbish

## 2019-10-27 NOTE — Progress Notes (Signed)
Cardiology Office Note:    Date:  10/27/2019   ID:  ASCENSION STFLEUR, DOB [birth year], MRN [id number]  PCP:  [name], MD  Cardiologist:  Candee Furbish, MD  Electrophysiologist:  None   Referring MD: [name], MD     History of Present Illness:    Michael Guzman is a 74 y.o. male here for follow-up with coronary disease status post DES to mid LAD and obtuse marginal in 2013 diabetes with hypertension high triglycerides arthritis recent hip surgery Dr. Berenice Primas  Overall been doing quite well.  Still having hip pain however.  Has been reassured.  Walking with a cane.  No anginal symptoms.  Taking his medications.  See below. Past Medical History:  Diagnosis Date  . Arthritis   . CAD (coronary artery disease)    a.s/p DES to mid LAD and OM2 06/2012.  Marland Kitchen Chronic back pain   . Chronically dry eyes   . Diabetes mellitus without complication ([place])    TYPE 2   . DKA (diabetic ketoacidoses) ([place]) 01/30/2017  . Elevated PSA    being monitored  . Fatty liver   . GERD (gastroesophageal reflux disease)   . Hx of radiation therapy    prostate , [facility]   . Hypertension   . Hypertriglyceridemia   . Intermediate coronary syndrome ([place]) 07/26/2012  . Prostate cancer ([place])    RADTIATION     Past Surgical History:  Procedure Laterality Date  . APPENDECTOMY    . CARDIAC SURGERY  ~2017   3 stent placed.  . COLONOSCOPY  02/12/2010   Buccini  . LEFT HEART CATHETERIZATION WITH CORONARY ANGIOGRAM N/A 06/27/2012   Procedure: LEFT HEART CATHETERIZATION WITH CORONARY ANGIOGRAM;  Surgeon: Candee Furbish, MD;  Location: [facility] CATH LAB;  Service: Cardiovascular;  Laterality: N/A;  . LEFT HEART CATHETERIZATION WITH CORONARY ANGIOGRAM N/A 07/25/2012   Procedure: LEFT HEART CATHETERIZATION WITH CORONARY ANGIOGRAM;  Surgeon: Sueanne Margarita, MD;  Location: [facility] CATH LAB;  Service: Cardiovascular;  Laterality: N/A;  . PROSTATE BIOPSY    . RADIOLOGY WITH ANESTHESIA N/A 12/03/2015    Procedure: MRI LUMBAR SPINE;  Surgeon: Medication Radiologist, MD;  Location: [facility];  Service: Radiology;  Laterality: N/A;  . TOTAL HIP ARTHROPLASTY Right 04/28/2019   Procedure: TOTAL HIP ARTHROPLASTY ANTERIOR APPROACH;  Surgeon: Dorna Leitz, MD;  Location: [facility];  Service: Orthopedics;  Laterality: Right;    Current Medications: Current Meds  Medication Sig  . Ascorbic Acid (VITAMIN C) 100 MG tablet Take 100 mg by mouth daily.  Marland Kitchen atorvastatin (LIPITOR) 20 MG tablet Take 1 tablet (20 mg total) by mouth daily at 6 PM. Please keep upcoming appt in February with Dr. Marlou Porch before anymore refills. Thank you  . Blood Glucose Monitoring Suppl (ONE TOUCH ULTRA 2) w/Device KIT Check blood sugar 1 time daily-DX-E11.22  . Blood Glucose Monitoring Suppl (ONE TOUCH ULTRA SYSTEM KIT) w/Device KIT Check blood sugar 1 time daily-DX-E11.22  . buPROPion (WELLBUTRIN XL) 300 MG 24 hr tablet Take 1 tablet  Daily for Mood, Focus and Concentration  . Cholecalciferol (VITAMIN D3) 125 MCG (5000 UT) CAPS Take 10,000 Units by mouth daily.   Marland Kitchen docusate sodium (COLACE) 100 MG capsule Take 1 capsule (100 mg total) by mouth 2 (two) times daily.  . furosemide (LASIX) 40 MG tablet Take 1 tablet 2 x /day as needed  for Fluid Retention  . gabapentin (NEURONTIN) 800 MG tablet TAKE 1 TABLET BY MOUTH 3 TO 4 TIMES DAILY  FOR PAIN, HOT FLASHES AND SWEATS (Patient taking differently: Take 1,600 mg by mouth 2 (two) times daily. )  . glucose blood (ONE TOUCH ULTRA TEST) test strip USE WITH METER TO CHECK  BLOOD SUGAR 2 TIMES DAILY  . glucose blood (ONETOUCH ULTRA) test strip Check blood sugar 3 times a day.DX-E11.22  . insulin NPH-regular Human (70-30) 100 UNIT/ML injection Inject 100 Units into the skin daily.  . Lancets (ONETOUCH DELICA PLUS [id number]) MISC CHECK BLOOD SUGAR 3 TIMES  DAILY  . Magnesium 250 MG TABS Take 250 mg by mouth daily.   . metFORMIN (GLUCOPHAGE-XR) 500 MG 24 hr tablet Take 2 tablets 2 x /day with Meals for  Diabetes  . metoprolol succinate (TOPROL-XL) 25 MG 24 hr tablet TAKE ONE-HALF TABLET BY  MOUTH DAILY WITH OR  IMMEDIATELY FOLLOWING A  MEAL. (Patient taking differently: Take 12.5 mg by mouth daily. )  . montelukast (SINGULAIR) 10 MG tablet Take 1 tablet (10 mg total) by mouth at bedtime.  . Multiple Vitamin (MULTIVITAMIN WITH MINERALS) TABS tablet Take 1 tablet by mouth daily.  . nitroGLYCERIN (NITROSTAT) 0.4 MG SL tablet PLACE 1 TABLET UNDER THE TONGUE EVERY 5 MINUTES AS NEEDED FOR CHEST PAIN (Patient taking differently: Place 0.4 mg under the tongue every 5 (five) minutes as needed for chest pain. PLACE 1 TABLET UNDER THE TONGUE EVERY 5 MINUTES AS NEEDED FOR CHEST PAIN)  . ondansetron (ZOFRAN) 4 MG tablet Take 1 tablet (4 mg total) by mouth daily as needed for nausea or vomiting.  . pantoprazole (PROTONIX) 40 MG tablet TAKE 1 TABLET BY MOUTH  DAILY  . PARoxetine (PAXIL) 20 MG tablet Take 1 tablet Daily to Prevent Hot Flashes & Sweats  . Polyethyl Glycol-Propyl Glycol (SYSTANE) 0.4-0.3 % SOLN Place 2 drops into both eyes daily as needed (for dry eyes).  . tamsulosin (FLOMAX) 0.4 MG CAPS capsule Take 0.4 mg by mouth at bedtime.  . Testosterone 20.25 MG/1.25GM (1.62%) GEL Place 1 packet onto the skin daily.  . traMADol (ULTRAM) 50 MG tablet Take 1-2 tablets (50-100 mg total) by mouth every 6 (six) hours as needed for moderate pain.  . Zinc 30 MG TABS Take 30 mg by mouth daily.  . [DISCONTINUED] aspirin EC 325 MG tablet Take 1 tablet (325 mg total) by mouth 2 (two) times daily after a meal. Take x 1 month post op to decrease risk of blood clots.     Allergies:   Patient has no known allergies.   Social History   Socioeconomic History  . Marital status: Married    Spouse name: Not on file  . Number of children: 4  . Years of education: 2 y colleg  . Highest education level: Not on file  Occupational History  . Occupation: SUPERVISOR    Employer: [employer]  Tobacco Use  .  Smoking status: Former Smoker    Packs/day: 0.75    Years: 50.00    Pack years: 37.50    Start date: 1960    Quit date: 07/27/2011    Years since quitting: 8.2  . Smokeless tobacco: Never Used  Substance and Sexual Activity  . Alcohol use: Not Currently    Comment: qut 20 years ago per patient   . Drug use: No  . Sexual activity: Not on file  Other Topics Concern  . Not on file  Social History Narrative   Works as    Scientist, physiological Strain:   .  Difficulty of Paying Living Expenses: Not on file  Food Insecurity:   . Worried About Charity fundraiser in the Last Year: Not on file  . Ran Out of Food in the Last Year: Not on file  Transportation Needs:   . Lack of Transportation (Medical): Not on file  . Lack of Transportation (Non-Medical): Not on file  Physical Activity:   . Days of Exercise per Week: Not on file  . Minutes of Exercise per Session: Not on file  Stress:   . Feeling of Stress : Not on file  Social Connections:   . Frequency of Communication with Friends and Family: Not on file  . Frequency of Social Gatherings with Friends and Family: Not on file  . Attends Religious Services: Not on file  . Active Member of Clubs or Organizations: Not on file  . Attends Archivist Meetings: Not on file  . Marital Status: Not on file     Family History: The patient's family history includes Diabetes in his sister.  ROS:   Please see the history of present illness.    No fevers chills nausea vomiting syncope bleeding all other systems reviewed and are negative.  EKGs/Labs/Other Studies Reviewed:    The following studies were reviewed today: Echo EF 65% in 2019  EKG:   The ekg ordered today demonstrates sinus rhythm 70 no other changes.  Recent Labs: 09/13/2019: ALT 65; BUN 15; Creat 1.23; Hemoglobin 13.1; Magnesium 1.9; Platelets 192; Potassium 4.2; Sodium 140; TSH 0.78  Recent Lipid Panel    Component Value Date/Time    CHOL 120 09/13/2019 1426   TRIG 162 (H) 09/13/2019 1426   HDL 61 09/13/2019 1426   CHOLHDL 2.0 09/13/2019 1426   VLDL 59 (H) 01/15/2017 1118   LDLCALC 35 09/13/2019 1426   LDLDIRECT 45.7 10/30/2013 0743    Physical Exam:    VS:  BP 132/78   Pulse 70   Ht 5' 9"  (1.753 m)   Wt 217 lb (98.4 kg)   SpO2 96%   BMI 32.05 kg/m     Wt Readings from Last 3 Encounters:  10/27/19 217 lb (98.4 kg)  09/13/19 214 lb 3.2 oz (97.2 kg)  06/14/19 209 lb (94.8 kg)     GEN:  Well nourished, well developed in no acute distress utilizing a cane for ambulation HEENT: Normal NECK: No JVD; No carotid bruits LYMPHATICS: No lymphadenopathy CARDIAC: RRR, no murmurs, rubs, gallops RESPIRATORY:  Clear to auscultation without rales, wheezing or rhonchi  ABDOMEN: Soft, non-tender, non-distended MUSCULOSKELETAL:  No edema; No deformity  SKIN: Warm and dry NEUROLOGIC:  Alert and oriented x 3 PSYCHIATRIC:  Normal affect   ASSESSMENT:    1. CAD in native artery   2. ASCAD s/p PTCA (06/2012)   3. Mixed hyperlipidemia   4. Diabetes mellitus with coincident hypertension ([place])   5. Aortic atherosclerosis (HCC)    PLAN:    In order of problems listed above:  Coronary disease -Doing well stable without any anginal symptoms.  Continue with aspirin 81 mg.  Statin.  Doing well.  Essential hypertension with diabetes -Excellent hemoglobin A1c of 6.9.  Working hard on this with his primary physician.  Excellent.  Continue.  Blood pressure today wonderful.  Aortic atherosclerosis -I personally reviewed his chest x-ray from 11/21/2018 which shows aortic calcification at arch.  Continue with statin therapy.  Prior LDL from outside labs 35.  Excellent.  ALT 65.  Continuing to monitor.  Medication Adjustments/Labs and Tests Ordered: Current medicines are reviewed at length with the patient today.  Concerns regarding medicines are outlined above.  Orders Placed This Encounter  Procedures  . EKG 12-Lead    Meds ordered this encounter  Medications  . aspirin EC 81 MG tablet    Sig: Take 1 tablet (81 mg total) by mouth daily.    Dispense:  90 tablet    Refill:  3    Patient Instructions  Medication Instructions:   CONTINUE TAKING ASPIRIN 81 MG BY MOUTH DAILY  *If you need a refill on your cardiac medications before your next appointment, please call your pharmacy*    Follow-Up: At [facility], you and your health needs are our priority.  As part of our continuing mission to provide you with exceptional heart care, we have created designated Provider Care Teams.  These Care Teams include your primary Cardiologist (physician) and Advanced Practice Providers (APPs -  Physician Assistants and Nurse Practitioners) who all work together to provide you with the care you need, when you need it.  Your next appointment:   12 month(s)  The format for your next appointment:   In Person  Provider:   Dr. Candee Furbish       Signed, Candee Furbish, MD  10/27/2019 9:39 AM    [place]

## 2019-11-24 DIAGNOSIS — H401131 Primary open-angle glaucoma, bilateral, mild stage: Secondary | ICD-10-CM | POA: Diagnosis not present

## 2019-12-05 DIAGNOSIS — Z96641 Presence of right artificial hip joint: Secondary | ICD-10-CM | POA: Diagnosis not present

## 2019-12-05 DIAGNOSIS — M25511 Pain in right shoulder: Secondary | ICD-10-CM | POA: Diagnosis not present

## 2019-12-05 DIAGNOSIS — M25512 Pain in left shoulder: Secondary | ICD-10-CM | POA: Diagnosis not present

## 2019-12-05 DIAGNOSIS — Z9889 Other specified postprocedural states: Secondary | ICD-10-CM | POA: Diagnosis not present

## 2019-12-12 ENCOUNTER — Other Ambulatory Visit: Payer: Self-pay | Admitting: Cardiology

## 2019-12-12 ENCOUNTER — Other Ambulatory Visit: Payer: Self-pay | Admitting: Internal Medicine

## 2019-12-12 DIAGNOSIS — E119 Type 2 diabetes mellitus without complications: Secondary | ICD-10-CM

## 2019-12-15 DIAGNOSIS — M25512 Pain in left shoulder: Secondary | ICD-10-CM | POA: Diagnosis not present

## 2019-12-15 DIAGNOSIS — M25511 Pain in right shoulder: Secondary | ICD-10-CM | POA: Diagnosis not present

## 2019-12-18 NOTE — Progress Notes (Signed)
FOLLOW UP 3 MONTH   Assessment / Plan    Michael Guzman was seen today for follow-up.  Diagnoses and all orders for this visit:  Essential hypertension Continue current medications: Monitor blood pressure at home; call if consistently over 130/80 Continue DASH diet.   Reminder to go to the ER if any CP, SOB, nausea, dizziness, severe HA, changes vision/speech, left arm numbness and tingling and jaw pain. -     CBC with Differential/Platelet -     COMPLETE METABOLIC PANEL WITH GFR -     TSH  Hyperlipidemia associated with type 2 diabetes mellitus (HCC) Continue medications: Atorvastatin 45m Discussed dietary and exercise modifications Low fat diet -     Lipid panel  Gastroesophageal reflux disease without esophagitis Continue PPI/H2 blocker, diet discussed  Type 2 diabetes mellitus with vascular disease ([place] Continue medications: Discussed general issues about diabetes pathophysiology and management. Education: Reviewed 'ABCs' of diabetes management (respective goals in parentheses):  A1C (<7), blood pressure (<130/80), and cholesterol (LDL <70) Dietary recommendations Encouraged aerobic exercise.  Discussed foot care, check daily Yearly retinal exam Dental exam every 6 months Monitor blood glucose, discussed goal for patient -     Hemoglobin A1c  Type 2 diabetes mellitus with stage 2 chronic kidney disease, with long-term current use of insulin (HCC) See above  Vitamin D deficiency Continue supplementation Taking Vitamin D 5,000 IU daily, two capsules daily -     VITAMIN D 25 Hydroxy (Vit-D Deficiency, Fractures)  Medication management Continued  Aortic atherosclerosis (HCC) Discussed dietary and exercise modifications  Fatty infiltration of liver Weight loss advised, avoid alcohol/tylenol, will monitor LFTs  Diabetic peripheral neuropathy (HCC) Taking Gabpentin 8015m3-4 times a day Also for hot flashes, minimal improvement for this.  B12 deficiency -      Vitamin B12  Malaise and fatigue  History of prostate cancer Followed by Dr. EsJunious Silktreated by radiation, recently completed lupron  Over 30 minutes of face to face interview, exam, counseling, chart review, and critical decision making was performed  Future Appointments  Date Time Provider De[place]7/29/2021 10:00 AM Michael PintoMD [facility] None      Subjective:  ChHELIODORO DOMAGALSKIs a [age].o. male who presents 3 month follow up for HTN, HLD, DMII, and vitamin D Def.    He recently underwent R hip total replacement, 04/28/19,  by Dr. GrBerenice Primasue to chronic limiting hip pain; he completed PT at home by himself with instructions for guidance.  He has hx of ASCAD s/p PTCA (06/2012), last stress test 10/2018 followed by Dr. SkMarlou Porch  Former smoker, 37.5 pack year history, quit in 2012; last CXR 11/2018; has never had CT lung screening; discussed today.  Declined at this time.  He has history of prostate cancer treated by radiation in 2016-2017;followed by Dr. Eskridge q6m65mRecently had MRI of bilateral shoulders four days ago.  Reports there was discussion of surgery but reports he isn't sure if he is ready to move forward at this time.  His right shoulder is worse than the left.  Some concern of ongoing depression; currently treated by wellbutrin 300 mg daily.  Reports he is doing well.  BMI is Body mass index is 32.05 kg/m., he has been working on diet, eating less, PT 2 hours per week and walking and doing strength exercises daily.  Wt Readings from Last 3 Encounters:  12/19/19 217 lb (98.4 kg)  10/27/19 217 lb (98.4 kg)  09/13/19 214 lb 3.2  oz (97.2 kg)   His blood pressure has been controlled at home, he is on metoprolol 60m, half tablet 12.5 mg daily  their BP today: 126/74 He does workout. He denies chest pain, shortness of breath, dizziness.   He is on cholesterol medication Atorvastatin 20 mg daily and denies myalgias. His cholesterol is at goal.  The cholesterol last visit was:    .lastlip Lab Results  Component Value Date   CHOL 120 09/13/2019   HDL 61 09/13/2019   LDLCALC 35 09/13/2019   LDLDIRECT 45.7 10/30/2013   TRIG 162 (H) 09/13/2019   CHOLHDL 2.0 12/19/2019   He has been working on diet and exercise for Diabetes with diabetic chronic kidney disease, with other circulatory complications and with diabetic polyneuropathy , he is on bASA, he is not on ACE/ARB due to low BPS, sugars in the AM are 100-130, rare up to 140s,  Currently taking novolin 50 units AM, reports adjusts evening insulin, 0-50 units, Reports hasn't taken much in the evening, has remained well controlled. He adjusts his insulin in the evening based on reading.  Also on metformin 2000 mg daily. He denies hypoglycemia , polydipsia and polyuria. He has gabapentin prescribed for intermittent LE parasthesias, takes 800 mg QID PRN, none in the last month.  Last A1C was:  Lab Results  Component Value Date   HGBA1C 6.9 (H) 09/13/19   Lab Results  Component Value Date   GFRAA 68 09/13/2019   He has a history of thyroiditis, TSHs have been monitored closely and remained in normal range.   Lab Results  Component Value Date   TSH 0.78 09/13/2019  . Patient is on Vitamin D supplement.   Lab Results  Component Value Date   VD25OH 98 09/13/2019       Medication Review: Current Outpatient Medications on File Prior to Visit  Medication Sig Dispense Refill  . Ascorbic Acid (VITAMIN C) 100 MG tablet Take 100 mg by mouth daily.    .Marland Kitchenaspirin EC 81 MG tablet Take 1 tablet (81 mg total) by mouth daily. 90 tablet 3  . atorvastatin (LIPITOR) 20 MG tablet TAKE 1 TABLET BY MOUTH  DAILY AT 6PM 90 tablet 3  . Blood Glucose Monitoring Suppl (ONE TOUCH ULTRA SYSTEM KIT) w/Device KIT Check blood sugar 1 time daily-DX-E11.22 1 each 0  . buPROPion (WELLBUTRIN XL) 300 MG 24 hr tablet Take 1 tablet  Daily for Mood, Focus and Concentration 90 tablet 3  . Cholecalciferol (VITAMIN  D3) 125 MCG (5000 UT) CAPS Take 10,000 Units by mouth daily.     .Marland Kitchendocusate sodium (COLACE) 100 MG capsule Take 1 capsule (100 mg total) by mouth 2 (two) times daily. 30 capsule 0  . furosemide (LASIX) 40 MG tablet Take 1 tablet 2 x /day as needed  for Fluid Retention 180 tablet 3  . gabapentin (NEURONTIN) 800 MG tablet TAKE 1 TABLET BY MOUTH 3 TO 4 TIMES DAILY FOR PAIN, HOT FLASHES AND SWEATS (Patient taking differently: Take 1,600 mg by mouth 2 (two) times daily. ) 360 tablet 3  . glucose blood (ONETOUCH ULTRA) test strip Check blood sugar 3 times a day.DX-E11.22 300 each 1  . insulin NPH-regular Human (70-30) 100 UNIT/ML injection Inject 100 Units into the skin daily.    . Lancets (ONETOUCH DELICA PLUS [id number] MISC CHECK BLOOD SUGAR 3 TIMES  DAILY 300 each 3  . Magnesium 250 MG TABS Take 250 mg by mouth daily.     . metFORMIN (GLUCOPHAGE-XR)  500 MG 24 hr tablet Take 2 tablets 2 x /day  with Meals for Diabetes 360 tablet 0  . metoprolol succinate (TOPROL-XL) 25 MG 24 hr tablet TAKE ONE-HALF TABLET BY  MOUTH DAILY WITH OR  IMMEDIATELY FOLLOWING A  MEAL. (Patient taking differently: Take 12.5 mg by mouth daily. ) 45 tablet 3  . montelukast (SINGULAIR) 10 MG tablet Take 1 tablet (10 mg total) by mouth at bedtime. 90 tablet 3  . Multiple Vitamin (MULTIVITAMIN WITH MINERALS) TABS tablet Take 1 tablet by mouth daily.    . nitroGLYCERIN (NITROSTAT) 0.4 MG SL tablet PLACE 1 TABLET UNDER THE TONGUE EVERY 5 MINUTES AS NEEDED FOR CHEST PAIN (Patient taking differently: Place 0.4 mg under the tongue every 5 (five) minutes as needed for chest pain. PLACE 1 TABLET UNDER THE TONGUE EVERY 5 MINUTES AS NEEDED FOR CHEST PAIN) 25 tablet 6  . ondansetron (ZOFRAN) 4 MG tablet Take 1 tablet (4 mg total) by mouth daily as needed for nausea or vomiting. 30 tablet 1  . pantoprazole (PROTONIX) 40 MG tablet TAKE 1 TABLET BY MOUTH  DAILY 90 tablet 3  . PARoxetine (PAXIL) 20 MG tablet Take 1 tablet Daily to Prevent Hot  Flashes & Sweats 90 tablet 1  . Polyethyl Glycol-Propyl Glycol (SYSTANE) 0.4-0.3 % SOLN Place 2 drops into both eyes daily as needed (for dry eyes).    . tamsulosin (FLOMAX) 0.4 MG CAPS capsule Take 0.4 mg by mouth at bedtime.    . Testosterone 20.25 MG/1.25GM (1.62%) GEL Place 1 packet onto the skin daily.    . traMADol (ULTRAM) 50 MG tablet Take 1-2 tablets (50-100 mg total) by mouth every 6 (six) hours as needed for moderate pain. 40 tablet 0  . Zinc 30 MG TABS Take 30 mg by mouth daily.     No current facility-administered medications on file prior to visit.    Allergies: No Known Allergies  Current Problems (verified) has Hypertension (1991); GERD; ASCAD s/p PTCA (06/2012); Hyperlipidemia associated with type 2 diabetes mellitus ([place]); Vitamin D deficiency; Medication management; Diabetic peripheral neuropathy ([place]); Malignant neoplasm of prostate ([place]); Thyroiditis; Obesity (BMI 30.0-34.9); Type 2 diabetes mellitus with vascular disease ([place]); Aortic atherosclerosis ([place]); Microalbuminuric diabetic nephropathy ([place]); Hypertriglyceridemia; Fatty infiltration of liver; Former smoker (30+ pack years, quit in 2012); and Primary osteoarthritis of hip on their problem list.  Screening Tests Immunization History  Administered Date(s) Administered  . Influenza Split 06/26/2012  . Influenza, High Dose Seasonal PF 06/20/2015, 06/23/2016, 06/14/2017, 06/14/2019  . Influenza-Unspecified 06/14/2013, 06/25/2014, 06/03/2018  . Pneumococcal Conjugate-13 04/23/2016  . Pneumococcal Polysaccharide-23 06/14/2017  . Pneumococcal-Unspecified 06/16/2010  . Td 09/17/2003  . Tdap 11/26/2013  . Zoster 07/12/2012    Preventative care: Last colonoscopy: 2011 due 2021 with Dr. Cristina Gong discussed today. Echo 01/2018 Stress test 2017, 10/2018 Bone scan 04/2016 Thyroid 2017 Ct head 2018  Last CXR 11/2018 former smoker, declines CT today but will consider   Prior vaccinations: TD or Tdap: 2015  Influenza:  Due for 2021   Pneumococcal: 2011, 2018 Prevnar13: 2017 Shingles/Zostavax: 2013 COIVD19-SARS2 vaccine series complete.   Names of Other Physician/Practitioners you currently use: 1. [facility] here for primary care 2. Dr. Gershon Crane, eye doctor, last visit August 07/2019 - report verified and abstracted, goes 3 times per year 3. [facility], dentist, last visit 2020  Patient Care Team: [name], MD as PCP - General (Internal Medicine) Jerline Pain, MD as PCP - Cardiology (Cardiology)  Surgical:  He  has a past surgical history that includes Appendectomy; Cardiac surgery (~2017); left heart catheterization with coronary angiogram (N/A, 06/27/2012); left heart catheterization with coronary angiogram (N/A, 07/25/2012); Colonoscopy (02/12/2010); Radiology with anesthesia (N/A, 12/03/2015); Prostate biopsy; and Total hip arthroplasty (Right, 04/28/2019). Family His family history includes Diabetes in his sister. Social history  He reports that he quit smoking about 8 years ago. He started smoking about 61 years ago. He has a 37.50 pack-year smoking history. He has never used smokeless tobacco. He reports previous alcohol use. He reports that he does not use drugs.   Objective:   Today's Vitals   12/19/19 1024  BP: 126/74  Pulse: 78  Temp: 98.4 F (36.9 C)  SpO2: 98%  Weight: 217 lb (98.4 kg)   Body mass index is 32.05 kg/m.  General appearance: alert, no distress, WD/WN, male HEENT: normocephalic, sclerae anicteric, TMs pearly, nares patent, no discharge or erythema, pharynx normal Oral cavity: MMM, no lesions Neck: supple, no lymphadenopathy, no thyromegaly, no masses Heart: RRR, normal S1, S2, no murmurs Lungs: CTA bilaterally, no wheezes, rhonchi, or rales Abdomen: +bs, soft, non tender, non distended, no masses, no hepatomegaly, no splenomegaly Musculoskeletal: Musculoskeletal: Full ROM,  5/5 strength, normal  gait. Extremities: no edema, no cyanosis, no clubbing Pulses: 2+ symmetric, upper and lower extremities, normal cap refill Neurological: alert, oriented x 3, CN2-12 intact, strength normal upper extremities and lower extremities, sensation decreased bilateral feet, DTRs 2+ throughout, no cerebellar signs. Psych: Awake and oriented X 3, normal affect, Insight and Judgment appropriate.      Garnet Sierras, NP   12/19/2019

## 2019-12-19 ENCOUNTER — Encounter: Payer: Self-pay | Admitting: Adult Health Nurse Practitioner

## 2019-12-19 ENCOUNTER — Ambulatory Visit (INDEPENDENT_AMBULATORY_CARE_PROVIDER_SITE_OTHER): Payer: Medicare Other | Admitting: Adult Health Nurse Practitioner

## 2019-12-19 ENCOUNTER — Other Ambulatory Visit: Payer: Self-pay

## 2019-12-19 VITALS — BP 126/74 | HR 78 | Temp 98.4°F | Wt 217.0 lb

## 2019-12-19 DIAGNOSIS — E1165 Type 2 diabetes mellitus with hyperglycemia: Secondary | ICD-10-CM | POA: Diagnosis not present

## 2019-12-19 DIAGNOSIS — E1122 Type 2 diabetes mellitus with diabetic chronic kidney disease: Secondary | ICD-10-CM | POA: Diagnosis not present

## 2019-12-19 DIAGNOSIS — E1159 Type 2 diabetes mellitus with other circulatory complications: Secondary | ICD-10-CM | POA: Diagnosis not present

## 2019-12-19 DIAGNOSIS — R5381 Other malaise: Secondary | ICD-10-CM

## 2019-12-19 DIAGNOSIS — E785 Hyperlipidemia, unspecified: Secondary | ICD-10-CM

## 2019-12-19 DIAGNOSIS — I700 Atherosclerosis of aorta: Secondary | ICD-10-CM

## 2019-12-19 DIAGNOSIS — I10 Essential (primary) hypertension: Secondary | ICD-10-CM

## 2019-12-19 DIAGNOSIS — E559 Vitamin D deficiency, unspecified: Secondary | ICD-10-CM

## 2019-12-19 DIAGNOSIS — Z794 Long term (current) use of insulin: Secondary | ICD-10-CM

## 2019-12-19 DIAGNOSIS — E1169 Type 2 diabetes mellitus with other specified complication: Secondary | ICD-10-CM | POA: Diagnosis not present

## 2019-12-19 DIAGNOSIS — R5383 Other fatigue: Secondary | ICD-10-CM

## 2019-12-19 DIAGNOSIS — E538 Deficiency of other specified B group vitamins: Secondary | ICD-10-CM

## 2019-12-19 DIAGNOSIS — Z79899 Other long term (current) drug therapy: Secondary | ICD-10-CM

## 2019-12-19 DIAGNOSIS — K219 Gastro-esophageal reflux disease without esophagitis: Secondary | ICD-10-CM

## 2019-12-19 DIAGNOSIS — N182 Chronic kidney disease, stage 2 (mild): Secondary | ICD-10-CM

## 2019-12-19 DIAGNOSIS — K760 Fatty (change of) liver, not elsewhere classified: Secondary | ICD-10-CM

## 2019-12-19 DIAGNOSIS — Z8546 Personal history of malignant neoplasm of prostate: Secondary | ICD-10-CM

## 2019-12-19 DIAGNOSIS — E1142 Type 2 diabetes mellitus with diabetic polyneuropathy: Secondary | ICD-10-CM

## 2019-12-19 NOTE — Patient Instructions (Signed)
We will contact you via MyChart in 1-3 days to comment on your lab results.  Ear care: Both of your ears are clear of wax at this time Use 2-3 drops of oil for next 3-4 nights.  This will help to sooth your ear canals.  Use cotton ball to keep in ears while sleeping.   For regular maintenance: Use warm or room temp peroxide in ear once every 1-2 weeks, then apply oil that night x1.   Next morning let warm water from shower run into your ears.   This will help flush out any wax Do not use cotton swabs in your ears Should you have pain, decrease in your ability to hear or dizziness please contact the office for an appointment.     Due to recent changes in healthcare laws, you may see the results of your imaging and laboratory studies on MyChart before your provider has had a chance to review them.  We understand that in some cases there may be results that are confusing or concerning to you. Not all laboratory results come back in the same time frame and the provider may be waiting for multiple results in order to interpret others.  Please give [place] 48 hours in order for your provider to thoroughly review all the results before contacting the office for clarification of your results.   +++++++++++++++++++++++++++++++  Vit D  & Vit C 1,000 mg   are recommended to help protect  against the Covid-19 and other Corona viruses.    Also it's recommended  to take  Zinc 50 mg  to help  protect against the Covid-19   and best place to get  is also on [email or web address]  and don't pay more than 6-8 cents /pill !  ================================ Coronavirus (COVID-19) Are you at risk?  Are you at risk for the Coronavirus (COVID-19)?  To be considered HIGH RISK for Coronavirus (COVID-19), you have to meet the following criteria:  . Traveled to [place], [place], [place], [place] or [place]; or in the [place] to [place], [place], [place]  . or [place]; and have fever, cough, and shortness  of breath within the last 2 weeks of travel OR . Been in close contact with a person diagnosed with COVID-19 within the last 2 weeks and have  . fever, cough,and shortness of breath .  . IF YOU DO NOT MEET THESE CRITERIA, YOU ARE CONSIDERED LOW RISK FOR COVID-19.  What to do if you are HIGH RISK for COVID-19?  Marland Kitchen If you are having a medical emergency, call 911. . Seek medical care right away. Before you go to a doctor's office, urgent care or emergency department, .  call ahead and tell them about your recent travel, contact with someone diagnosed with COVID-19  .  and your symptoms.  . You should receive instructions from your physician's office regarding next steps of care.  . When you arrive at healthcare provider, tell the healthcare staff immediately you have returned from  . visiting [place], [place], [place], [place] or [place]; or traveled in the [place] to [place], [place],  . [place] or [place] in the last two weeks or you have been in close contact with a person diagnosed with  . COVID-19 in the last 2 weeks.   . Tell the health care staff about your symptoms: fever, cough and shortness of breath. . After you have been seen by a medical provider, you will be either: o  Tested for (COVID-19) and discharged home on quarantine except to seek medical care if  o symptoms worsen, and asked to  - Stay home and avoid contact with others until you get your results (4-5 days)  - Avoid travel on public transportation if possible (such as bus, train, or airplane) or o Sent to the Emergency Department by EMS for evaluation, COVID-19 testing  and  o possible admission depending on your condition and test results.  What to do if you are LOW RISK for COVID-19?  Reduce your risk of any infection by using the same precautions used for avoiding the common cold or flu:  Marland Kitchen Wash your hands often with soap and warm water for at least 20 seconds.  If soap and water are not readily  available,  . use an alcohol-based hand sanitizer with at least 60% alcohol.  . If coughing or sneezing, cover your mouth and nose by coughing or sneezing into the elbow areas of your shirt or coat, .  into a tissue or into your sleeve (not your hands). . Avoid shaking hands with others and consider head nods or verbal greetings only. . Avoid touching your eyes, nose, or mouth with unwashed hands.  . Avoid close contact with people who are sick. . Avoid places or events with large numbers of people in one location, like concerts or sporting events. . Carefully consider travel plans you have or are making. . If you are planning any travel outside or inside the [place], visit the CDC's Travelers' Health webpage for the latest health notices. . If you have some symptoms but not all symptoms, continue to monitor at home and seek medical attention  . if your symptoms worsen. . If you are having a medical emergency, call 911. >>>>>>>>>>>>>>>>>>>>>>>>>>>>>>>>>>>>>>>>>>>>>>>>>>>>>>> We Do NOT Approve of  [facility] [place] Soliciting Our Patients  To Do Home Visits  & We Do NOT Approve of [employer] > > > > > > > > > > > > > > > > > > > > > > > > > > > > > > > > > > >  > > > >   Preventive Care for Adults  A healthy lifestyle and preventive care can promote health and wellness. Preventive health guidelines for men include the following key practices:  A routine yearly physical is a good way to check with your health care provider about your health and preventative screening. It is a chance to share any concerns and updates on your health and to receive a thorough exam.  Visit your dentist for a routine exam and preventative care every 6 months. Brush your teeth twice a day and floss once a day. Good oral hygiene prevents tooth decay and gum disease.  The frequency of eye exams is based on your age, health, family medical history, use of contact lenses, and other factors. Follow  your health care provider's recommendations for frequency of eye exams.  Eat a healthy diet. Foods such as vegetables, fruits, whole grains, low-fat dairy products, and lean protein foods contain the nutrients you need without too many calories. Decrease your intake of foods high in solid fats, added sugars, and salt. Eat the right amount of calories for you. Get information about a proper diet from your health care provider, if necessary.  Regular physical exercise is one of the most important things you can do for your health. Most adults should get at least 150 minutes of moderate-intensity exercise (any activity  that increases your heart rate and causes you to sweat) each week. In addition, most adults need muscle-strengthening exercises on 2 or more days a week.  Maintain a healthy weight. The body mass index (BMI) is a screening tool to identify possible weight problems. It provides an estimate of body fat based on height and weight. Your health care provider can find your BMI and can help you achieve or maintain a healthy weight. For adults 20 years and older:  A BMI below 18.5 is considered underweight.  A BMI of 18.5 to 24.9 is normal.  A BMI of 25 to 29.9 is considered overweight.  A BMI of 30 and above is considered obese.  Maintain normal blood lipids and cholesterol levels by exercising and minimizing your intake of saturated fat. Eat a balanced diet with plenty of fruit and vegetables. Blood tests for lipids and cholesterol should begin at age 52 and be repeated every 5 years. If your lipid or cholesterol levels are high, you are over 50, or you are at high risk for heart disease, you may need your cholesterol levels checked more frequently. Ongoing high lipid and cholesterol levels should be treated with medicines if diet and exercise are not working.  If you smoke, find out from your health care provider how to quit. If you do not use tobacco, do not start.  Lung cancer screening  is recommended for adults aged 86-80 years who are at high risk for developing lung cancer because of a history of smoking. A yearly low-dose CT scan of the lungs is recommended for people who have at least a 30-pack-year history of smoking and are a current smoker or have quit within the past 15 years. A pack year of smoking is smoking an average of 1 pack of cigarettes a day for 1 year (for example: 1 pack a day for 30 years or 2 packs a day for 15 years). Yearly screening should continue until the smoker has stopped smoking for at least 15 years. Yearly screening should be stopped for people who develop a health problem that would prevent them from having lung cancer treatment.  If you choose to drink alcohol, do not have more than 2 drinks per day. One drink is considered to be 12 ounces (355 mL) of beer, 5 ounces (148 mL) of wine, or 1.5 ounces (44 mL) of liquor.  Avoid use of street drugs. Do not share needles with anyone. Ask for help if you need support or instructions about stopping the use of drugs.  High blood pressure causes heart disease and increases the risk of stroke. Your blood pressure should be checked at least every 1-2 years. Ongoing high blood pressure should be treated with medicines, if weight loss and exercise are not effective.  If you are 64-73 years old, ask your health care provider if you should take aspirin to prevent heart disease.  Diabetes screening involves taking a blood sample to check your fasting blood sugar level. Testing should be considered at a younger age or be carried out more frequently if you are overweight and have at least 1 risk factor for diabetes.  Colorectal cancer can be detected and often prevented. Most routine colorectal cancer screening begins at the age of 50 and continues through age 59. However, your health care provider may recommend screening at an earlier age if you have risk factors for colon cancer. On a yearly basis, your health care  provider may provide home test kits to check for  hidden blood in the stool. Use of a small camera at the end of a tube to directly examine the colon (sigmoidoscopy or colonoscopy) can detect the earliest forms of colorectal cancer. Talk to your health care provider about this at age 67, when routine screening begins. Direct exam of the colon should be repeated every 5-10 years through age 11, unless early forms of precancerous polyps or small growths are found.  Hepatitis C blood testing is recommended for all people born from [birth year] through 1965 and any individual with known risks for hepatitis C.  Screening for abdominal aortic aneurysm (AAA)  by ultrasound is recommended for people who have history of high blood pressure or who are current or former smokers.  Healthy men should  receive prostate-specific antigen (PSA) blood tests as part of routine cancer screening. Talk with your health care provider about prostate cancer screening.  Testicular cancer screening is  recommended for adult males. Screening includes self-exam, a health care provider exam, and other screening tests. Consult with your health care provider about any symptoms you have or any concerns you have about testicular cancer.  Use sunscreen. Apply sunscreen liberally and repeatedly throughout the day. You should seek shade when your shadow is shorter than you. Protect yourself by wearing long sleeves, pants, a wide-brimmed hat, and sunglasses year round, whenever you are outdoors.  Once a month, do a whole-body skin exam, using a mirror to look at the skin on your back. Tell your health care provider about new moles, moles that have irregular borders, moles that are larger than a pencil eraser, or moles that have changed in shape or color.  Stay current with required vaccines (immunizations).  Influenza vaccine. All adults should be immunized every year.  Tetanus, diphtheria, and acellular pertussis (Td, Tdap) vaccine. An adult  who has not previously received Tdap or who does not know his vaccine status should receive 1 dose of Tdap. This initial dose should be followed by tetanus and diphtheria toxoids (Td) booster doses every 10 years. Adults with an unknown or incomplete history of completing a 3-dose immunization series with Td-containing vaccines should begin or complete a primary immunization series including a Tdap dose. Adults should receive a Td booster every 10 years.  Zoster vaccine. One dose is recommended for adults aged 40 years or older unless certain conditions are present.    PREVNAR - Pneumococcal 13-valent conjugate (PCV13) vaccine. When indicated, a person who is uncertain of his immunization history and has no record of immunization should receive the PCV13 vaccine. An adult aged 44 years or older who has certain medical conditions and has not been previously immunized should receive 1 dose of PCV13 vaccine. This PCV13 should be followed with a dose of pneumococcal polysaccharide (PPSV23) vaccine. The PPSV23 vaccine dose should be obtained 1 or more year(s)after the dose of PCV13 vaccine. An adult aged 41 years or older who has certain medical conditions and previously received 1 or more doses of PPSV23 vaccine should receive 1 dose of PCV13. The PCV13 vaccine dose should be obtained 1 or more years after the last PPSV23 vaccine dose.    PNEUMOVAX - Pneumococcal polysaccharide (PPSV23) vaccine. When PCV13 is also indicated, PCV13 should be obtained first. All adults aged 11 years and older should be immunized. An adult younger than age 52 years who has certain medical conditions should be immunized. Any person who resides in a nursing home or long-term care facility should be immunized. An adult smoker should be immunized.  People with an immunocompromised condition and certain other conditions should receive both PCV13 and PPSV23 vaccines. People with human immunodeficiency virus (HIV) infection should be  immunized as soon as possible after diagnosis. Immunization during chemotherapy or radiation therapy should be avoided. Routine use of PPSV23 vaccine is not recommended for American Indians, [place] Natives, or people younger than 65 years unless there are medical conditions that require PPSV23 vaccine. When indicated, people who have unknown immunization and have no record of immunization should receive PPSV23 vaccine. One-time revaccination 5 years after the first dose of PPSV23 is recommended for people aged 19-64 years who have chronic kidney failure, nephrotic syndrome, asplenia, or immunocompromised conditions. People who received 1-2 doses of PPSV23 before age 34 years should receive another dose of PPSV23 vaccine at age 12 years or later if at least 5 years have passed since the previous dose. Doses of PPSV23 are not needed for people immunized with PPSV23 at or after age 77 years.    Hepatitis A vaccine. Adults who wish to be protected from this disease, have certain high-risk conditions, work with hepatitis A-infected animals, work in hepatitis A research labs, or travel to or work in countries with a high rate of hepatitis A should be immunized. Adults who were previously unvaccinated and who anticipate close contact with an international adoptee during the first 60 days after arrival in the [place] from a country with a high rate of hepatitis A should be immunized.    Hepatitis B vaccine. Adults should be immunized if they wish to be protected from this disease, have certain high-risk conditions, may be exposed to blood or other infectious body fluids, are household contacts or sex partners of hepatitis B positive people, are clients or workers in certain care facilities, or travel to or work in countries with a high rate of hepatitis B.   Preventive Service / Frequency   Ages 21 and over  Blood pressure check.  Lipid and cholesterol check.  Lung cancer screening. / Every year if  you are aged 8-80 years and have a 30-pack-year history of smoking and currently smoke or have quit within the past 15 years. Yearly screening is stopped once you have quit smoking for at least 15 years or develop a health problem that would prevent you from having lung cancer treatment.  Fecal occult blood test (FOBT) of stool. You may not have to do this test if you get a colonoscopy every 10 years.  Flexible sigmoidoscopy** or colonoscopy.** / Every 5 years for a flexible sigmoidoscopy or every 10 years for a colonoscopy beginning at age 75 and continuing until age 8.  Hepatitis C blood test.** / For all people born from [birth year] through 1964 and any individual with known risks for hepatitis C.  Abdominal aortic aneurysm (AAA) screening./ Screening current or former smokers or have Hypertension.  Skin self-exam. / Monthly.  Influenza vaccine. / Every year.  Tetanus, diphtheria, and acellular pertussis (Tdap/Td) vaccine.** / 1 dose of Td every 10 years.   Zoster vaccine.** / 1 dose for adults aged 50 years or older.         Pneumococcal 13-valent conjugate (PCV13) vaccine.    Pneumococcal polysaccharide (PPSV23) vaccine.     Hepatitis A vaccine.** / Consult your health care provider.  Hepatitis B vaccine.** / Consult your health care provider. Screening for abdominal aortic aneurysm (AAA)  by ultrasound is recommended for people who have history of high blood pressure or who are current or former smokers. ++++++++++  Recommend Adult Low Dose Aspirin or  coated  Aspirin 81 mg daily  To reduce risk of Colon Cancer 40 %,  Skin Cancer 26 % ,  Malignant Melanoma 46%  and  Pancreatic cancer 60% ++++++++++++++++++++++ Vitamin D goal  is between 70-100.  Please make sure that you are taking your Vitamin D as directed.  It is very important as a natural anti-inflammatory  helping hair, skin, and nails, as well as reducing stroke and heart attack risk.  It helps your bones and helps  with mood. It also decreases numerous cancer risks so please take it as directed.  Low Vit D is associated with a 200-300% higher risk for CANCER  and 200-300% higher risk for HEART   ATTACK  &  STROKE.   .....................................Marland Kitchen It is also associated with higher death rate at younger ages,  autoimmune diseases like Rheumatoid arthritis, Lupus, Multiple Sclerosis.    Also many other serious conditions, like depression, Alzheimer's Dementia, infertility, muscle aches, fatigue, fibromyalgia - just to name a few. ++++++++++++++++++++++ Recommend the book "The END of DIETING" by Dr Excell Seltzer  & the book "The END of DIABETES " by Dr Excell Seltzer At [email or web address] - get book & Audio CD's    Being diabetic has a  300% increased risk for heart attack, stroke, cancer, and alzheimer- type vascular dementia. It is very important that you work harder with diet by avoiding all foods that are white. Avoid white rice (brown & wild rice is OK), white potatoes (sweetpotatoes in moderation is OK), White bread or wheat bread or anything made out of white flour like bagels, donuts, rolls, buns, biscuits, cakes, pastries, cookies, pizza crust, and pasta (made from white flour & egg whites) - vegetarian pasta or spinach or wheat pasta is OK. Multigrain breads like Arnold's or [employer], or multigrain sandwich thins or flatbreads.  Diet, exercise and weight loss can reverse and cure diabetes in the early stages.  Diet, exercise and weight loss is very important in the control and prevention of complications of diabetes which affects every system in your body, ie. Brain - dementia/stroke, eyes - glaucoma/blindness, heart - heart attack/heart failure, kidneys - dialysis, stomach - gastric paralysis, intestines - malabsorption, nerves - severe painful neuritis, circulation - gangrene & loss of a leg(s), and finally cancer and Alzheimers.    I recommend avoid fried & greasy foods,  sweets/candy, white rice  (brown or wild rice or Quinoa is OK), white potatoes (sweet potatoes are OK) - anything made from white flour - bagels, doughnuts, rolls, buns, biscuits,white and wheat breads, pizza crust and traditional pasta made of white flour & egg white(vegetarian pasta or spinach or wheat pasta is OK).  Multi-grain bread is OK - like multi-grain flat bread or sandwich thins. Avoid alcohol in excess. Exercise is also important.    Eat all the vegetables you want - avoid meat, especially red meat and dairy - especially cheese.  Cheese is the most concentrated form of trans-fats which is the worst thing to clog up our arteries. Veggie cheese is OK which can be found in the fresh produce section at [employer] or [employer] or [employer]  ++++++++++++++++++++++ DASH Eating Plan  DASH stands for "Dietary Approaches to Stop Hypertension."   The DASH eating plan is a healthy eating plan that has been shown to reduce high blood pressure (hypertension). Additional health benefits may include reducing the risk of type 2 diabetes mellitus, heart disease, and stroke. The DASH eating plan  may also help with weight loss. WHAT DO I NEED TO KNOW ABOUT THE DASH EATING PLAN? For the DASH eating plan, you will follow these general guidelines:  Choose foods with a percent daily value for sodium of less than 5% (as listed on the food label).  Use salt-free seasonings or herbs instead of table salt or sea salt.  Check with your health care provider or pharmacist before using salt substitutes.  Eat lower-sodium products, often labeled as "lower sodium" or "no salt added."  Eat fresh foods.  Eat more vegetables, fruits, and low-fat dairy products.  Choose whole grains. Look for the word "whole" as the first word in the ingredient list.  Choose fish   Limit sweets, desserts, sugars, and sugary drinks.  Choose heart-healthy fats.  Eat veggie cheese   Eat more home-cooked food and less restaurant, buffet, and fast  food.  Limit fried foods.  Cook foods using methods other than frying.  Limit canned vegetables. If you do use them, rinse them well to decrease the sodium.  When eating at a restaurant, ask that your food be prepared with less salt, or no salt if possible.                      WHAT FOODS CAN I EAT? Read Dr Fara Olden Fuhrman's books on The End of Dieting & The End of Diabetes  Grains Whole grain or whole wheat bread. Brown rice. Whole grain or whole wheat pasta. Quinoa, bulgur, and whole grain cereals. Low-sodium cereals. Corn or whole wheat flour tortillas. Whole grain cornbread. Whole grain crackers. Low-sodium crackers.  Vegetables Fresh or frozen vegetables (raw, steamed, roasted, or grilled). Low-sodium or reduced-sodium tomato and vegetable juices. Low-sodium or reduced-sodium tomato sauce and paste. Low-sodium or reduced-sodium canned vegetables.   Fruits All fresh, canned (in natural juice), or frozen fruits.  Protein Products  All fish and seafood.  Dried beans, peas, or lentils. Unsalted nuts and seeds. Unsalted canned beans.  Dairy Low-fat dairy products, such as skim or 1% milk, 2% or reduced-fat cheeses, low-fat ricotta or cottage cheese, or plain low-fat yogurt. Low-sodium or reduced-sodium cheeses.  Fats and Oils Tub margarines without trans fats. Light or reduced-fat mayonnaise and salad dressings (reduced sodium). Avocado. Safflower, olive, or canola oils. Natural peanut or almond butter.  Other Unsalted popcorn and pretzels. The items listed above may not be a complete list of recommended foods or beverages. Contact your dietitian for more options.  ++++++++++++++++++++  WHAT FOODS ARE NOT RECOMMENDED? Grains/ White flour or wheat flour White bread. White pasta. White rice. Refined cornbread. Bagels and croissants. Crackers that contain trans fat.  Vegetables  Creamed or fried vegetables. Vegetables in a . Regular canned vegetables. Regular canned tomato sauce  and paste. Regular tomato and vegetable juices.  Fruits Dried fruits. Canned fruit in light or heavy syrup. Fruit juice.  Meat and Other Protein Products Meat in general - RED meat & White meat.  Fatty cuts of meat. Ribs, chicken wings, all processed meats as bacon, sausage, bologna, salami, fatback, hot dogs, bratwurst and packaged luncheon meats.  Dairy Whole or 2% milk, cream, half-and-half, and cream cheese. Whole-fat or sweetened yogurt. Full-fat cheeses or blue cheese. Non-dairy creamers and whipped toppings. Processed cheese, cheese spreads, or cheese curds.  Condiments Onion and garlic salt, seasoned salt, table salt, and sea salt. Canned and packaged gravies. Worcestershire sauce. Tartar sauce. Barbecue sauce. Teriyaki sauce. Soy sauce, including reduced sodium. Steak sauce. Fish sauce. [employer]  sauce. Cocktail sauce. Horseradish. Ketchup and mustard. Meat flavorings and tenderizers. Bouillon cubes. Hot sauce. Tabasco sauce. Marinades. Taco seasonings. Relishes.  Fats and Oils Butter, stick margarine, lard, shortening and bacon fat. Coconut, palm kernel, or palm oils. Regular salad dressings.  Pickles and olives. Salted popcorn and pretzels.  The items listed above may not be a complete list of foods and beverages to avoid.

## 2019-12-20 LAB — CBC WITH DIFFERENTIAL/PLATELET
Absolute Monocytes: 662 cells/uL (ref 200–950)
Basophils Absolute: 38 cells/uL (ref 0–200)
Basophils Relative: 0.8 %
Eosinophils Absolute: 192 cells/uL (ref 15–500)
Eosinophils Relative: 4 %
HCT: 38.2 % — ABNORMAL LOW (ref 38.5–50.0)
Hemoglobin: 12.5 g/dL — ABNORMAL LOW (ref 13.2–17.1)
Lymphs Abs: 1181 cells/uL (ref 850–3900)
MCH: 29.7 pg (ref 27.0–33.0)
MCHC: 32.7 g/dL (ref 32.0–36.0)
MCV: 90.7 fL (ref 80.0–100.0)
MPV: 11.3 fL (ref 7.5–12.5)
Monocytes Relative: 13.8 %
Neutro Abs: 2726 cells/uL (ref 1500–7800)
Neutrophils Relative %: 56.8 %
Platelets: 183 10*3/uL (ref 140–400)
RBC: 4.21 10*6/uL (ref 4.20–5.80)
RDW: 12.6 % (ref 11.0–15.0)
Total Lymphocyte: 24.6 %
WBC: 4.8 10*3/uL (ref 3.8–10.8)

## 2019-12-20 LAB — HEMOGLOBIN A1C
Hgb A1c MFr Bld: 6.6 % of total Hgb — ABNORMAL HIGH (ref <5.7)
Mean Plasma Glucose: 143 (calc)
eAG (mmol/L): 7.9 (calc)

## 2019-12-20 LAB — COMPLETE METABOLIC PANEL WITH GFR
AG Ratio: 2 (calc) (ref 1.0–2.5)
ALT: 40 U/L (ref 9–46)
AST: 35 U/L (ref 10–35)
Albumin: 4.3 g/dL (ref 3.6–5.1)
Alkaline phosphatase (APISO): 64 U/L (ref 35–144)
BUN: 10 mg/dL (ref 7–25)
CO2: 27 mmol/L (ref 20–32)
Calcium: 9.9 mg/dL (ref 8.6–10.3)
Chloride: 104 mmol/L (ref 98–110)
Creat: 1 mg/dL (ref 0.70–1.18)
GFR, Est African American: 87 mL/min/{1.73_m2} (ref >=60)
GFR, Est Non African American: 75 mL/min/{1.73_m2} (ref >=60)
Globulin: 2.2 g/dL (calc) (ref 1.9–3.7)
Glucose, Bld: 95 mg/dL (ref 65–99)
Potassium: 4.3 mmol/L (ref 3.5–5.3)
Sodium: 141 mmol/L (ref 135–146)
Total Bilirubin: 0.5 mg/dL (ref 0.2–1.2)
Total Protein: 6.5 g/dL (ref 6.1–8.1)

## 2019-12-20 LAB — TSH: TSH: 0.74 mIU/L (ref 0.40–4.50)

## 2019-12-20 LAB — LIPID PANEL
Cholesterol: 132 mg/dL (ref <200)
HDL: 50 mg/dL (ref >=40)
LDL Cholesterol (Calc): 58 mg/dL (calc)
Non-HDL Cholesterol (Calc): 82 mg/dL (calc) (ref <130)
Total CHOL/HDL Ratio: 2.6 (calc) (ref <5.0)
Triglycerides: 158 mg/dL — ABNORMAL HIGH (ref <150)

## 2019-12-20 LAB — VITAMIN B12: Vitamin B-12: 703 pg/mL (ref 200–1100)

## 2019-12-20 LAB — VITAMIN D 25 HYDROXY (VIT D DEFICIENCY, FRACTURES): Vit D, 25-Hydroxy: 106 ng/mL — ABNORMAL HIGH (ref 30–100)

## 2019-12-21 NOTE — Progress Notes (Signed)
-  Blood count Hemoglobin decreased slightly, we will continue to monitor this.  -Electrolytes, kidney and liver function are in normal range.  Liver function improved from last check.  -Your cholesterol, triglycerides improved to 158, almost to goal.  Keeping blood glucose in tight range also helps with triglycerides.  -Your TSH, thyroid, is in normal range.  -A1c, looking at glucose in your blood over the past three months is 6.6, looks good!  -Your Vitamin D is high at 106.  You can reduce dose from 10,000IU daily to 5,000IU daily.  -B12 - looks good 703  Sincerely,        Garnet Sierras, NP

## 2019-12-28 DIAGNOSIS — M67911 Unspecified disorder of synovium and tendon, right shoulder: Secondary | ICD-10-CM | POA: Diagnosis not present

## 2019-12-28 DIAGNOSIS — M67912 Unspecified disorder of synovium and tendon, left shoulder: Secondary | ICD-10-CM | POA: Diagnosis not present

## 2019-12-28 DIAGNOSIS — M4692 Unspecified inflammatory spondylopathy, cervical region: Secondary | ICD-10-CM | POA: Diagnosis not present

## 2020-01-01 ENCOUNTER — Telehealth: Payer: Self-pay | Admitting: *Deleted

## 2020-01-01 NOTE — Telephone Encounter (Signed)
   Primary Cardiologist: Candee Furbish, MD  Chart reviewed as part of pre-operative protocol coverage. Hx of coronary disease status post DES to mid LAD and obtuse marginal in 2013. Last stress test 10/2018 was low risk (done for hip surgery clearance).   Left voice mail to call back.   Dr. Marlou Porch, can patient hold ASA for 5-7 days prior to surgery?   [place], [place] 01/01/2020, 1:42 PM

## 2020-01-01 NOTE — Telephone Encounter (Signed)
OK to hold ASA for 5-7 days prior to surgery Candee Furbish, MD

## 2020-01-01 NOTE — Telephone Encounter (Signed)
   [facility] Pre-operative Risk Assessment    Request for surgical clearance:  1. What type of surgery is being performed? RIGHT SHOULDER ARTHROSCOPY WITH MINI OPEN REPAIR   2. When is this surgery scheduled? 01/10/20   3. What type of clearance is required (medical clearance vs. Pharmacy clearance to hold med vs. Both)? MEDICAL  4. Are there any medications that need to be held prior to surgery and how long? ASA    5. Practice name and name of physician performing surgery? [facility]; DR. Jenny Reichmann GRAV[place]   6. What is your office phone number [phone]   7.   What is your office fax number [phone]  8.   Anesthesia type (None, local, MAC, general) ? CHOICE   Julaine Hua 01/01/2020, 12:30 PM  _________________________________________________________________   (provider comments below)

## 2020-01-02 NOTE — Telephone Encounter (Signed)
   Primary Cardiologist: Candee Furbish, MD  Chart reviewed as part of pre-operative protocol coverage. Given past medical history and time since last visit, based on ACC/AHA guidelines, JANCE PACKARD would be at acceptable risk for the planned procedure without further cardiovascular testing.   He may hold his aspirin for 5-7 days prior to the surgery and resume as soon as hemostasis is achieved.  I will route this recommendation to the requesting party via Epic fax function and remove from pre-op pool.  Please call with questions.  Jossie Ng. [facility] [address] Office [phone] Fax [phone]

## 2020-01-10 DIAGNOSIS — M7541 Impingement syndrome of right shoulder: Secondary | ICD-10-CM | POA: Diagnosis not present

## 2020-01-10 DIAGNOSIS — S43431A Superior glenoid labrum lesion of right shoulder, initial encounter: Secondary | ICD-10-CM | POA: Diagnosis not present

## 2020-01-10 DIAGNOSIS — M7521 Bicipital tendinitis, right shoulder: Secondary | ICD-10-CM | POA: Diagnosis not present

## 2020-01-10 DIAGNOSIS — S43491A Other sprain of right shoulder joint, initial encounter: Secondary | ICD-10-CM | POA: Diagnosis not present

## 2020-01-10 DIAGNOSIS — M7581 Other shoulder lesions, right shoulder: Secondary | ICD-10-CM | POA: Diagnosis not present

## 2020-01-10 DIAGNOSIS — G8918 Other acute postprocedural pain: Secondary | ICD-10-CM | POA: Diagnosis not present

## 2020-01-10 DIAGNOSIS — M19011 Primary osteoarthritis, right shoulder: Secondary | ICD-10-CM | POA: Diagnosis not present

## 2020-01-17 ENCOUNTER — Other Ambulatory Visit: Payer: Self-pay | Admitting: Internal Medicine

## 2020-01-17 DIAGNOSIS — F339 Major depressive disorder, recurrent, unspecified: Secondary | ICD-10-CM

## 2020-01-31 ENCOUNTER — Other Ambulatory Visit: Payer: Self-pay | Admitting: Internal Medicine

## 2020-01-31 MED ORDER — VENLAFAXINE HCL ER 150 MG PO CP24: ORAL_CAPSULE | ORAL | 1 refills | Status: DC

## 2020-02-06 DIAGNOSIS — Z9889 Other specified postprocedural states: Secondary | ICD-10-CM | POA: Diagnosis not present

## 2020-02-07 DIAGNOSIS — M25611 Stiffness of right shoulder, not elsewhere classified: Secondary | ICD-10-CM | POA: Diagnosis not present

## 2020-02-07 DIAGNOSIS — M6281 Muscle weakness (generalized): Secondary | ICD-10-CM | POA: Diagnosis not present

## 2020-02-12 ENCOUNTER — Other Ambulatory Visit: Payer: Self-pay | Admitting: Internal Medicine

## 2020-02-12 DIAGNOSIS — E119 Type 2 diabetes mellitus without complications: Secondary | ICD-10-CM

## 2020-02-20 DIAGNOSIS — M25611 Stiffness of right shoulder, not elsewhere classified: Secondary | ICD-10-CM | POA: Diagnosis not present

## 2020-02-20 DIAGNOSIS — M6281 Muscle weakness (generalized): Secondary | ICD-10-CM | POA: Diagnosis not present

## 2020-02-22 DIAGNOSIS — M6281 Muscle weakness (generalized): Secondary | ICD-10-CM | POA: Diagnosis not present

## 2020-02-22 DIAGNOSIS — M25611 Stiffness of right shoulder, not elsewhere classified: Secondary | ICD-10-CM | POA: Diagnosis not present

## 2020-02-27 DIAGNOSIS — M6281 Muscle weakness (generalized): Secondary | ICD-10-CM | POA: Diagnosis not present

## 2020-02-27 DIAGNOSIS — M25611 Stiffness of right shoulder, not elsewhere classified: Secondary | ICD-10-CM | POA: Diagnosis not present

## 2020-02-27 DIAGNOSIS — H401131 Primary open-angle glaucoma, bilateral, mild stage: Secondary | ICD-10-CM | POA: Diagnosis not present

## 2020-02-29 DIAGNOSIS — M6281 Muscle weakness (generalized): Secondary | ICD-10-CM | POA: Diagnosis not present

## 2020-02-29 DIAGNOSIS — M25611 Stiffness of right shoulder, not elsewhere classified: Secondary | ICD-10-CM | POA: Diagnosis not present

## 2020-02-29 DIAGNOSIS — Z9889 Other specified postprocedural states: Secondary | ICD-10-CM | POA: Diagnosis not present

## 2020-03-05 DIAGNOSIS — M6281 Muscle weakness (generalized): Secondary | ICD-10-CM | POA: Diagnosis not present

## 2020-03-05 DIAGNOSIS — M25611 Stiffness of right shoulder, not elsewhere classified: Secondary | ICD-10-CM | POA: Diagnosis not present

## 2020-03-07 DIAGNOSIS — M25611 Stiffness of right shoulder, not elsewhere classified: Secondary | ICD-10-CM | POA: Diagnosis not present

## 2020-03-07 DIAGNOSIS — M6281 Muscle weakness (generalized): Secondary | ICD-10-CM | POA: Diagnosis not present

## 2020-03-19 DIAGNOSIS — Z9889 Other specified postprocedural states: Secondary | ICD-10-CM | POA: Diagnosis not present

## 2020-04-03 DIAGNOSIS — E230 Hypopituitarism: Secondary | ICD-10-CM | POA: Diagnosis not present

## 2020-04-08 DIAGNOSIS — Z9889 Other specified postprocedural states: Secondary | ICD-10-CM | POA: Diagnosis not present

## 2020-04-09 ENCOUNTER — Other Ambulatory Visit: Payer: Self-pay | Admitting: Internal Medicine

## 2020-04-09 DIAGNOSIS — F339 Major depressive disorder, recurrent, unspecified: Secondary | ICD-10-CM

## 2020-04-10 ENCOUNTER — Encounter: Payer: Self-pay | Admitting: Internal Medicine

## 2020-04-10 NOTE — Progress Notes (Signed)
Annual  Screening/Preventative Visit  & Comprehensive Evaluation & Examination     This very nice 74 y.o.  MBM presents for a Screening /Preventative Visit & comprehensive evaluation and management of multiple medical co-morbidities.  Patient has been followed for HTN, HLD, T2_NIDDM  and Vitamin D Deficiency. Patient has GERD controlled on his meds.     Last year, patient  underwent R hip total replacement (04/28/19) and recently Rt Shoulder surgery (01/10/2020)     HTN predates circa 1991. Patient's BP has been controlled at home.  Today's BP is at goal - 134/80.  Patient has ASCAD with PCA & Stent placed in 2013 and is followed by Dr Marlou Porch.  Stress Lexiscan in Feb 2020 was Normal. Patient denies any cardiac symptoms as chest pain, palpitations, shortness of breath, dizziness or ankle swelling.     Patient's hyperlipidemia is controlled with diet and medications. Patient denies myalgias or other medication SE's. Last lipids were at goal:  Lab Results  Component Value Date   CHOL 132 12/19/2019   HDL 50 12/19/2019   LDLCALC 58 12/19/2019   LDLDIRECT 45.7 10/30/2013   TRIG 158 (H) 12/19/2019   CHOLHDL 2.6 12/19/2019       Patient has moderate obesity (BMI 33+) and T2_NIDDM/CKD2 (GFR 77) since 1998. Patient was initially treated with Metformin til started on Insulin in 2016.  The patient denies reactive hypoglycemic symptoms, visual blurring, diabetic polys or paresthesias. Last A1c was not at goal:  Lab Results  Component Value Date   HGBA1C 6.6 (H) 12/19/2019        Finally, patient has history of Vitamin D Deficiency ("15" / 2008) and last vitamin D was slightly elevated:  Lab Results  Component Value Date   VD25OH 106 (H) 12/19/2019    Current Outpatient Medications on File Prior to Visit  Medication Sig  . Ascorbic Acid (VITAMIN C) 100 MG tablet Take 100 mg by mouth daily.  Marland Kitchen aspirin EC 81 MG tablet Take 1 tablet (81 mg total) by mouth daily.  Marland Kitchen atorvastatin (LIPITOR)  20 MG tablet TAKE 1 TABLET BY MOUTH  DAILY AT 6PM  . Blood Glucose Monitoring Suppl (ONE TOUCH ULTRA SYSTEM KIT) w/Device KIT Check blood sugar 1 time daily-DX-E11.22  . buPROPion (WELLBUTRIN XL) 300 MG 24 hr tablet TAKE 1 TABLET BY MOUTH  DAILY FOR MOOD, FOCUS AND  CONCENTRATION  . Cholecalciferol (VITAMIN D3) 125 MCG (5000 UT) CAPS Take 10,000 Units by mouth daily.   Marland Kitchen docusate sodium (COLACE) 100 MG capsule Take 1 capsule (100 mg total) by mouth 2 (two) times daily.  . furosemide (LASIX) 40 MG tablet Take 1 tablet 2 x /day as needed  for Fluid Retention  . gabapentin (NEURONTIN) 800 MG tablet TAKE 1 TABLET BY MOUTH 3 TO 4 TIMES DAILY FOR PAIN, HOT FLASHES AND SWEATS (Patient taking differently: Take 1,600 mg by mouth 2 (two) times daily. )  . glucose blood (ONETOUCH ULTRA) test strip Check blood sugar 3 times a day.[id number]  . insulin NPH-regular Human (70-30) 100 UNIT/ML injection Inject 100 Units into the skin daily.  . Lancets (ONETOUCH DELICA PLUS [id number]) MISC CHECK BLOOD SUGAR 3 TIMES  DAILY  . Magnesium 250 MG TABS Take 250 mg by mouth daily.   . metFORMIN (GLUCOPHAGE-XR) 500 MG 24 hr tablet Take 2 tablets 2 x /day with Meals for Diabetes  . metoprolol succinate (TOPROL-XL) 25 MG 24 hr tablet TAKE ONE-HALF TABLET BY  MOUTH DAILY WITH OR  IMMEDIATELY FOLLOWING  A  MEAL. (Patient taking differently: Take 12.5 mg by mouth daily. )  . montelukast (SINGULAIR) 10 MG tablet Take 1 tablet (10 mg total) by mouth at bedtime.  . Multiple Vitamin (MULTIVITAMIN WITH MINERALS) TABS tablet Take 1 tablet by mouth daily.  . nitroGLYCERIN (NITROSTAT) 0.4 MG SL tablet PLACE 1 TABLET UNDER THE TONGUE EVERY 5 MINUTES AS NEEDED FOR CHEST PAIN (Patient taking differently: Place 0.4 mg under the tongue every 5 (five) minutes as needed for chest pain. PLACE 1 TABLET UNDER THE TONGUE EVERY 5 MINUTES AS NEEDED FOR CHEST PAIN)  . ondansetron (ZOFRAN) 4 MG tablet Take 1 tablet (4 mg total) by mouth daily as needed  for nausea or vomiting.  . pantoprazole (PROTONIX) 40 MG tablet TAKE 1 TABLET BY MOUTH  DAILY  . PARoxetine (PAXIL) 20 MG tablet Take 1 tablet Daily to Prevent Hot Flashes & Sweats  . Polyethyl Glycol-Propyl Glycol (SYSTANE) 0.4-0.3 % SOLN Place 2 drops into both eyes daily as needed (for dry eyes).  . tamsulosin (FLOMAX) 0.4 MG CAPS capsule Take 0.4 mg by mouth at bedtime.  . traMADol (ULTRAM) 50 MG tablet Take 1-2 tablets (50-100 mg total) by mouth every 6 (six) hours as needed for moderate pain.  Marland Kitchen venlafaxine XR (EFFEXOR XR) 150 MG 24 hr capsule Take 1 capsule Daily to prevent Sweats  . Zinc 30 MG TABS Take 30 mg by mouth daily.   No current facility-administered medications on file prior to visit.   No Known Allergies   Past Medical History:  Diagnosis Date  . Arthritis   . CAD (coronary artery disease)    a.s/p DES to mid LAD and OM2 06/2012.  Marland Kitchen Chronic back pain   . Chronically dry eyes   . Diabetes mellitus without complication ([place])    TYPE 2   . DKA (diabetic ketoacidoses) ([place]) 01/30/2017  . Elevated PSA    being monitored  . Fatty liver   . GERD (gastroesophageal reflux disease)   . Hx of radiation therapy    prostate , [facility]   . Hypertension   . Hypertriglyceridemia   . Intermediate coronary syndrome ([place]) 07/26/2012  . Prostate cancer [facility])    RADTIATION    Health Maintenance  Topic Date Due  . COVID-19 Vaccine (1) Never done  . COLONOSCOPY  02/13/2020  . URINE MICROALBUMIN  03/06/2020  . INFLUENZA VACCINE  04/14/2020  . OPHTHALMOLOGY EXAM  04/24/2020  . HEMOGLOBIN A1C  06/19/2020  . FOOT EXAM  04/10/2021  . TETANUS/TDAP  11/27/2023  . Hepatitis C Screening  Completed  . PNA vac Low Risk Adult  Completed   Immunization History  Administered Date(s) Administered  . Influenza Split 06/26/2012  . Influenza, High Dose Seasonal PF 06/20/2015, 06/23/2016, 06/14/2017, 06/14/2019  . Influenza-Unspecified 06/14/2013, 06/25/2014, 06/03/2018    . Pneumococcal Conjugate-13 04/23/2016  . Pneumococcal Polysaccharide-23 06/14/2017  . Pneumococcal-Unspecified 06/16/2010  . Td 09/17/2003  . Tdap 11/26/2013  . Zoster 07/12/2012   Last Colon -   Past Surgical History:  Procedure Laterality Date  . APPENDECTOMY    . CARDIAC SURGERY  ~2017   3 stent placed.  . COLONOSCOPY  02/12/2010   [facility]  . LEFT HEART CATHETERIZATION WITH CORONARY ANGIOGRAM N/A 06/27/2012   Procedure: LEFT HEART CATHETERIZATION WITH CORONARY ANGIOGRAM;  Surgeon: Candee Furbish, MD;  Location: [facility] CATH LAB;  Service: Cardiovascular;  Laterality: N/A;  . LEFT HEART CATHETERIZATION WITH CORONARY ANGIOGRAM N/A 07/25/2012   Procedure: LEFT HEART CATHETERIZATION WITH  CORONARY ANGIOGRAM;  Surgeon: Sueanne Margarita, MD;  Location: [facility] CATH LAB;  Service: Cardiovascular;  Laterality: N/A;  . PROSTATE BIOPSY    . RADIOLOGY WITH ANESTHESIA N/A 12/03/2015   Procedure: MRI LUMBAR SPINE;  Surgeon: Medication Radiologist, MD;  Location: [facility];  Service: Radiology;  Laterality: N/A;  . TOTAL HIP ARTHROPLASTY Right 04/28/2019   Procedure: TOTAL HIP ARTHROPLASTY ANTERIOR APPROACH;  Surgeon: Dorna Leitz, MD;  Location: [facility];  Service: Orthopedics;  Laterality: Right;   Family History  Problem Relation Age of Onset  . Diabetes Sister    Social History   Socioeconomic History  . Marital status: Married    Spouse name: Not on file  . Number of children: 4  . Years of education: 2 y colleg  . Highest education level: Not on file  Occupational History  . Occupation: SUPERVISOR    Employer: [employer]  Tobacco Use  . Smoking status: Former Smoker    Packs/day: 0.75    Years: 50.00    Pack years: 37.50    Start date: 1960    Quit date: 07/27/2011    Years since quitting: 8.7  . Smokeless tobacco: Never Used  Substance and Sexual Activity  . Alcohol use: Not Currently    Comment: qut 20 years ago per patient   . Drug use: No  . Sexual activity: Not on file     ROS Constitutional: Denies fever, chills, weight loss/gain, headaches, insomnia,  night sweats or change in appetite. Does c/o fatigue. Eyes: Denies redness, blurred vision, diplopia, discharge, itchy or watery eyes.  ENT: Denies discharge, congestion, post nasal drip, epistaxis, sore throat, earache, hearing loss, dental pain, Tinnitus, Vertigo, Sinus pain or snoring.  Cardio: Denies chest pain, palpitations, irregular heartbeat, syncope, dyspnea, diaphoresis, orthopnea, PND, claudication or edema Respiratory: denies cough, dyspnea, DOE, pleurisy, hoarseness, laryngitis or wheezing.  Gastrointestinal: Denies dysphagia, heartburn, reflux, water brash, pain, cramps, nausea, vomiting, bloating, diarrhea, constipation, hematemesis, melena, hematochezia, jaundice or hemorrhoids Genitourinary: Denies dysuria, frequency, urgency, nocturia, hesitancy, discharge, hematuria or flank pain Musculoskeletal: Denies arthralgia, myalgia, stiffness, Jt. Swelling, pain, limp or strain/sprain. Denies Falls. Skin: Denies puritis, rash, hives, warts, acne, eczema or change in skin lesion Neuro: No weakness, tremor, incoordination, spasms, paresthesia or pain Psychiatric: Denies confusion, memory loss or sensory loss. Denies Depression. Endocrine: Denies change in weight, skin, hair change, nocturia, and paresthesia, diabetic polys, visual blurring or hyper / hypo glycemic episodes.  Heme/Lymph: No excessive bleeding, bruising or enlarged lymph nodes.  Physical Exam  BP (!) 134/80   Pulse 76   Temp (!) 97.3 F (36.3 C)   Resp 16   Ht 5' 9"  (1.753 m)   Wt (!) 213 lb (96.6 kg)   BMI 31.45 kg/m   General Appearance: Over nourished and well groomed and in no apparent distress.  Eyes: PERRLA, EOMs, conjunctiva no swelling or erythema, normal fundi and vessels. Sinuses: No frontal/maxillary tenderness ENT/Mouth: EACs patent / TMs  nl. Nares clear without erythema, swelling, mucoid exudates. Oral hygiene is good.  No erythema, swelling, or exudate. Tongue normal, non-obstructing. Tonsils not swollen or erythematous. Hearing normal.  Neck: Supple, thyroid not palpable. No bruits, nodes or JVD. Respiratory: Respiratory effort normal.  BS equal and clear bilateral without rales, rhonci, wheezing or stridor. Cardio: Heart sounds are normal with regular rate and rhythm and no murmurs, rubs or gallops. Peripheral pulses are normal and equal bilaterally without edema. No aortic or femoral bruits. Chest: symmetric with normal excursions  and percussion.  Abdomen: Soft, with Nl bowel sounds. Nontender, no guarding, rebound, hernias, masses, or organomegaly.  Lymphatics: Non tender without lymphadenopathy.  Musculoskeletal: Full ROM all peripheral extremities, joint stability, 5/5 strength, and normal gait. Skin: Warm and dry without rashes, lesions, cyanosis, clubbing or  ecchymosis.  Neuro: Cranial nerves intact, reflexes equal bilaterally. Normal muscle tone, no cerebellar symptoms. Sensation intact.  Pysch: Alert and oriented X 3 with normal affect, insight and judgment appropriate.   Assessment and Plan  1. Annual Preventative/Screening Exam   2. Essential hypertension  - EKG 12-Lead - [place], RETROPERITNL ABD,  LTD - Urinalysis, Routine w reflex microscopic - Microalbumin / creatinine urine ratio - CBC with Differential/Platelet - COMPLETE METABOLIC PANEL WITH GFR - Magnesium - TSH  3. Hyperlipidemia associated with type 2 diabetes mellitus ([place])  - EKG 12-Lead - [place], RETROPERITNL ABD,  LTD - Lipid panel - TSH  4. Type 2 diabetes mellitus with stage 2 chronic kidney disease,  with long-term current use of insulin (HCC)  - EKG 12-Lead - [place], RETROPERITNL ABD,  LTD - Urinalysis, Routine w reflex microscopic - Microalbumin / creatinine urine ratio - Hemoglobin A1c - Insulin, random  5. Vitamin D deficiency  - VITAMIN D 25 Hydroxy  6. ASCAD s/p PTCA (06/2012)  - EKG 12-Lead  7.  Gastroesophageal reflux disease  - CBC with Differential/Platelet  8. Poorly controlled type 2 diabetes mellitus with peripheral neuropathy (HCC)  - HM DIABETES FOOT EXAM - LOW EXTREMITY NEUR EXAM DOCUM  9. History of prostate cancer  - PSA  10. Screening for colorectal cancer  - POC Hemoccult Bld/Stl   11. Prostate cancer screening  - PSA  12. Screening for ischemic heart disease  - EKG 12-Lead  13. FHx: heart disease  - EKG 12-Lead - [place], RETROPERITNL ABD,  LTD  14. Former smoker   - EKG 12-Lead - [place], RETROPERITNL ABD,  LTD  15. Aortic atherosclerosis (HCC)  - [place], RETROPERITNL ABD,  LTD  16. Screening for AAA (aortic abdominal aneurysm)  - [place], RETROPERITNL ABD,  LTD  17. Medication management  - Urinalysis, Routine w reflex microscopic - Microalbumin / creatinine urine ratio - CBC with Differential/Platelet - COMPLETE METABOLIC PANEL WITH GFR - Magnesium - Lipid panel - TSH - Hemoglobin A1c - Insulin, random - VITAMIN D 25 Hydroxy         Patient was counseled in prudent diet, weight control to achieve/maintain BMI less than 25, BP monitoring, regular exercise and medications as discussed.  Discussed med effects and SE's. Routine screening labs and tests as requested with regular follow-up as recommended. Over 40 minutes of exam, counseling, chart review and high complex critical decision making was performed   Kirtland Bouchard, MD

## 2020-04-10 NOTE — Patient Instructions (Addendum)

## 2020-04-11 ENCOUNTER — Ambulatory Visit (INDEPENDENT_AMBULATORY_CARE_PROVIDER_SITE_OTHER): Payer: Medicare Other | Admitting: Internal Medicine

## 2020-04-11 ENCOUNTER — Other Ambulatory Visit: Payer: Self-pay

## 2020-04-11 VITALS — BP 134/80 | HR 76 | Temp 97.3°F | Resp 16 | Ht 69.0 in | Wt 213.0 lb

## 2020-04-11 DIAGNOSIS — Z0001 Encounter for general adult medical examination with abnormal findings: Secondary | ICD-10-CM

## 2020-04-11 DIAGNOSIS — E1169 Type 2 diabetes mellitus with other specified complication: Secondary | ICD-10-CM

## 2020-04-11 DIAGNOSIS — Z9861 Coronary angioplasty status: Secondary | ICD-10-CM

## 2020-04-11 DIAGNOSIS — Z79899 Other long term (current) drug therapy: Secondary | ICD-10-CM

## 2020-04-11 DIAGNOSIS — Z87891 Personal history of nicotine dependence: Secondary | ICD-10-CM

## 2020-04-11 DIAGNOSIS — Z8546 Personal history of malignant neoplasm of prostate: Secondary | ICD-10-CM

## 2020-04-11 DIAGNOSIS — Z125 Encounter for screening for malignant neoplasm of prostate: Secondary | ICD-10-CM

## 2020-04-11 DIAGNOSIS — K219 Gastro-esophageal reflux disease without esophagitis: Secondary | ICD-10-CM

## 2020-04-11 DIAGNOSIS — I700 Atherosclerosis of aorta: Secondary | ICD-10-CM

## 2020-04-11 DIAGNOSIS — Z0000 Encounter for general adult medical examination without abnormal findings: Secondary | ICD-10-CM | POA: Diagnosis not present

## 2020-04-11 DIAGNOSIS — Z8249 Family history of ischemic heart disease and other diseases of the circulatory system: Secondary | ICD-10-CM | POA: Diagnosis not present

## 2020-04-11 DIAGNOSIS — E559 Vitamin D deficiency, unspecified: Secondary | ICD-10-CM | POA: Diagnosis not present

## 2020-04-11 DIAGNOSIS — Z136 Encounter for screening for cardiovascular disorders: Secondary | ICD-10-CM | POA: Diagnosis not present

## 2020-04-11 DIAGNOSIS — I2510 Atherosclerotic heart disease of native coronary artery without angina pectoris: Secondary | ICD-10-CM

## 2020-04-11 DIAGNOSIS — I10 Essential (primary) hypertension: Secondary | ICD-10-CM

## 2020-04-11 DIAGNOSIS — Z1212 Encounter for screening for malignant neoplasm of rectum: Secondary | ICD-10-CM

## 2020-04-11 DIAGNOSIS — E1122 Type 2 diabetes mellitus with diabetic chronic kidney disease: Secondary | ICD-10-CM

## 2020-04-11 DIAGNOSIS — E1142 Type 2 diabetes mellitus with diabetic polyneuropathy: Secondary | ICD-10-CM

## 2020-04-11 DIAGNOSIS — Z1211 Encounter for screening for malignant neoplasm of colon: Secondary | ICD-10-CM

## 2020-04-12 LAB — COMPLETE METABOLIC PANEL WITH GFR
AG Ratio: 1.6 (calc) (ref 1.0–2.5)
ALT: 44 U/L (ref 9–46)
AST: 36 U/L — ABNORMAL HIGH (ref 10–35)
Albumin: 4.1 g/dL (ref 3.6–5.1)
Alkaline phosphatase (APISO): 72 U/L (ref 35–144)
BUN: 13 mg/dL (ref 7–25)
CO2: 26 mmol/L (ref 20–32)
Calcium: 9.5 mg/dL (ref 8.6–10.3)
Chloride: 106 mmol/L (ref 98–110)
Creat: 0.9 mg/dL (ref 0.70–1.18)
GFR, Est African American: 99 mL/min/{1.73_m2} (ref >=60)
GFR, Est Non African American: 85 mL/min/{1.73_m2} (ref >=60)
Globulin: 2.5 g/dL (calc) (ref 1.9–3.7)
Glucose, Bld: 101 mg/dL — ABNORMAL HIGH (ref 65–99)
Potassium: 4.3 mmol/L (ref 3.5–5.3)
Sodium: 139 mmol/L (ref 135–146)
Total Bilirubin: 0.4 mg/dL (ref 0.2–1.2)
Total Protein: 6.6 g/dL (ref 6.1–8.1)

## 2020-04-12 LAB — HEMOGLOBIN A1C
Hgb A1c MFr Bld: 6.7 % of total Hgb — ABNORMAL HIGH (ref <5.7)
Mean Plasma Glucose: 146 (calc)
eAG (mmol/L): 8.1 (calc)

## 2020-04-12 LAB — LIPID PANEL
Cholesterol: 122 mg/dL (ref <200)
HDL: 46 mg/dL (ref >=40)
LDL Cholesterol (Calc): 53 mg/dL (calc)
Non-HDL Cholesterol (Calc): 76 mg/dL (calc) (ref <130)
Total CHOL/HDL Ratio: 2.7 (calc) (ref <5.0)
Triglycerides: 145 mg/dL (ref <150)

## 2020-04-12 LAB — CBC WITH DIFFERENTIAL/PLATELET
Absolute Monocytes: 734 cells/uL (ref 200–950)
Basophils Absolute: 38 cells/uL (ref 0–200)
Basophils Relative: 0.7 %
Eosinophils Absolute: 232 cells/uL (ref 15–500)
Eosinophils Relative: 4.3 %
HCT: 36.9 % — ABNORMAL LOW (ref 38.5–50.0)
Hemoglobin: 12.3 g/dL — ABNORMAL LOW (ref 13.2–17.1)
Lymphs Abs: 1269 cells/uL (ref 850–3900)
MCH: 29.8 pg (ref 27.0–33.0)
MCHC: 33.3 g/dL (ref 32.0–36.0)
MCV: 89.3 fL (ref 80.0–100.0)
MPV: 11.4 fL (ref 7.5–12.5)
Monocytes Relative: 13.6 %
Neutro Abs: 3127 cells/uL (ref 1500–7800)
Neutrophils Relative %: 57.9 %
Platelets: 182 10*3/uL (ref 140–400)
RBC: 4.13 10*6/uL — ABNORMAL LOW (ref 4.20–5.80)
RDW: 12.7 % (ref 11.0–15.0)
Total Lymphocyte: 23.5 %
WBC: 5.4 10*3/uL (ref 3.8–10.8)

## 2020-04-12 LAB — URINALYSIS, ROUTINE W REFLEX MICROSCOPIC
Bilirubin Urine: NEGATIVE
Glucose, UA: NEGATIVE
Hgb urine dipstick: NEGATIVE
Leukocytes,Ua: NEGATIVE
Nitrite: NEGATIVE
Protein, ur: NEGATIVE
Specific Gravity, Urine: 1.03 (ref 1.001–1.03)
pH: <=5 (ref 5.0–8.0)

## 2020-04-12 LAB — INSULIN, RANDOM: Insulin: 99.8 u[IU]/mL — ABNORMAL HIGH

## 2020-04-12 LAB — MICROALBUMIN / CREATININE URINE RATIO
Creatinine, Urine: 219 mg/dL (ref 20–320)
Microalb Creat Ratio: 4 mcg/mg creat (ref <30)
Microalb, Ur: 0.9 mg/dL

## 2020-04-12 LAB — TSH: TSH: 0.63 mIU/L (ref 0.40–4.50)

## 2020-04-12 LAB — MAGNESIUM: Magnesium: 1.9 mg/dL (ref 1.5–2.5)

## 2020-04-12 LAB — VITAMIN D 25 HYDROXY (VIT D DEFICIENCY, FRACTURES): Vit D, 25-Hydroxy: 79 ng/mL (ref 30–100)

## 2020-04-12 LAB — PSA: PSA: <0.1 ng/mL (ref <=4.0)

## 2020-04-13 NOTE — Progress Notes (Signed)
=====================================================  -    PSA - Not Detectable - Great ! =====================================================  -  Total Chol = 122 and LDL = 53 m- Both  Excellent   - Very low risk for Heart Attack  / Stroke =====================================================  - A1c = 6.7% - too High  (Ideal or Goal is less than 5.7%)   - So - Recommend a stricter low cholesterol diet & Weight Loss  - Cholesterol only comes from animal sources  - ie. meat, dairy, egg yolks  - Eat all the vegetables you want.  - Avoid meat, especially red meat - Beef AND Pork .  - Avoid cheese & dairy - milk & ice cream.     - Cheese is the most concentrated form of trans-fats which  is the worst thing to clog up our arteries.   - Veggie cheese is OK which can be found in the fresh  produce section at [employer] or [employer] or [employer] =====================================================  -  Vitamin D = 79 - Excellent =====================================================  -  All Else - CBC - Kidneys - Electrolytes - Liver - Magnesium & Thyroid    - all  Normal / OK ====================================================

## 2020-05-18 ENCOUNTER — Other Ambulatory Visit: Payer: Self-pay | Admitting: Internal Medicine

## 2020-05-21 DIAGNOSIS — M25511 Pain in right shoulder: Secondary | ICD-10-CM | POA: Diagnosis not present

## 2020-05-21 DIAGNOSIS — Z09 Encounter for follow-up examination after completed treatment for conditions other than malignant neoplasm: Secondary | ICD-10-CM | POA: Diagnosis not present

## 2020-05-29 ENCOUNTER — Other Ambulatory Visit: Payer: Self-pay | Admitting: Internal Medicine

## 2020-05-29 ENCOUNTER — Other Ambulatory Visit: Payer: Self-pay | Admitting: Adult Health

## 2020-05-29 DIAGNOSIS — M8949 Other hypertrophic osteoarthropathy, multiple sites: Secondary | ICD-10-CM

## 2020-05-29 DIAGNOSIS — M159 Polyosteoarthritis, unspecified: Secondary | ICD-10-CM

## 2020-05-29 DIAGNOSIS — E119 Type 2 diabetes mellitus without complications: Secondary | ICD-10-CM

## 2020-05-29 DIAGNOSIS — R61 Generalized hyperhidrosis: Secondary | ICD-10-CM

## 2020-06-03 DIAGNOSIS — H524 Presbyopia: Secondary | ICD-10-CM | POA: Diagnosis not present

## 2020-06-03 DIAGNOSIS — H2513 Age-related nuclear cataract, bilateral: Secondary | ICD-10-CM | POA: Diagnosis not present

## 2020-06-03 DIAGNOSIS — Z794 Long term (current) use of insulin: Secondary | ICD-10-CM | POA: Diagnosis not present

## 2020-06-03 DIAGNOSIS — H401131 Primary open-angle glaucoma, bilateral, mild stage: Secondary | ICD-10-CM | POA: Diagnosis not present

## 2020-06-03 DIAGNOSIS — E1136 Type 2 diabetes mellitus with diabetic cataract: Secondary | ICD-10-CM | POA: Diagnosis not present

## 2020-06-03 LAB — HM DIABETES EYE EXAM

## 2020-06-16 ENCOUNTER — Other Ambulatory Visit: Payer: Self-pay | Admitting: Cardiology

## 2020-06-19 ENCOUNTER — Encounter (INDEPENDENT_AMBULATORY_CARE_PROVIDER_SITE_OTHER): Payer: Self-pay | Admitting: Otolaryngology

## 2020-06-19 ENCOUNTER — Other Ambulatory Visit: Payer: Self-pay

## 2020-06-19 ENCOUNTER — Ambulatory Visit (INDEPENDENT_AMBULATORY_CARE_PROVIDER_SITE_OTHER): Payer: Medicare Other | Admitting: Otolaryngology

## 2020-06-19 VITALS — Temp 95.7°F

## 2020-06-19 DIAGNOSIS — H903 Sensorineural hearing loss, bilateral: Secondary | ICD-10-CM | POA: Diagnosis not present

## 2020-06-19 DIAGNOSIS — H9313 Tinnitus, bilateral: Secondary | ICD-10-CM

## 2020-06-19 NOTE — Progress Notes (Signed)
HPI: Michael Guzman is a 74 y.o. male who presents is referred by his PCP for evaluation of tinnitus that he has had for the past 5-6 years.  He denies any hearing problems.  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  He describes high-pitched ringing in both ears that he has had for a number of years but does not really notice trouble with his hearing..  Past Medical History:  Diagnosis Date  . Arthritis   . CAD (coronary artery disease)    a.s/p DES to mid LAD and OM2 06/2012.  Marland Kitchen Chronic back pain   . Chronically dry eyes   . Diabetes mellitus without complication ([place])    TYPE 2   . DKA (diabetic ketoacidoses) 01/30/2017  . Elevated PSA    being monitored  . Fatty liver   . GERD (gastroesophageal reflux disease)   . Hx of radiation therapy    prostate , [facility]   . Hypertension   . Hypertriglyceridemia   . Intermediate coronary syndrome ([place]) 07/26/2012  . Prostate cancer ([place])    RADTIATION    Past Surgical History:  Procedure Laterality Date  . APPENDECTOMY    . CARDIAC SURGERY  ~2017   3 stent placed.  . COLONOSCOPY  02/12/2010   Buccini  . LEFT HEART CATHETERIZATION WITH CORONARY ANGIOGRAM N/A 06/27/2012   Procedure: LEFT HEART CATHETERIZATION WITH CORONARY ANGIOGRAM;  Surgeon: Candee Furbish, MD;  Location: [facility] CATH LAB;  Service: Cardiovascular;  Laterality: N/A;  . LEFT HEART CATHETERIZATION WITH CORONARY ANGIOGRAM N/A 07/25/2012   Procedure: LEFT HEART CATHETERIZATION WITH CORONARY ANGIOGRAM;  Surgeon: Sueanne Margarita, MD;  Location: [facility] CATH LAB;  Service: Cardiovascular;  Laterality: N/A;  . PROSTATE BIOPSY    . RADIOLOGY WITH ANESTHESIA N/A 12/03/2015   Procedure: MRI LUMBAR SPINE;  Surgeon: Medication Radiologist, MD;  Location: [facility];  Service: Radiology;  Laterality: N/A;  . TOTAL HIP ARTHROPLASTY Right 04/28/2019   Procedure: TOTAL HIP ARTHROPLASTY  ANTERIOR APPROACH;  Surgeon: Dorna Leitz, MD;  Location: [facility];  Service: Orthopedics;  Laterality: Right;   Social History   Socioeconomic History  . Marital status: Married    Spouse name: Not on file  . Number of children: 4  . Years of education: 2 y colleg  . Highest education level: Not on file  Occupational History  . Occupation: SUPERVISOR    Employer: [employer]  Tobacco Use  . Smoking status: Former Smoker    Packs/day: 0.75    Years: 50.00    Pack years: 37.50    Start date: 1960    Quit date: 07/27/2011    Years since quitting: 8.9  . Smokeless tobacco: Never Used  Substance and Sexual Activity  . Alcohol use: Not Currently    Comment: qut 20 years ago per patient   . Drug use: No  . Sexual activity: Not on file  Other Topics Concern  . Not on file  Social History Narrative   Works as    Scientist, physiological Strain:   . Difficulty of Paying Living Expenses: Not on file  Food Insecurity:   . Worried About Charity fundraiser in the Last Year: Not on file  . Ran Out of Food in the Last Year: Not on file  Transportation Needs:   . Lack  of Transportation (Medical): Not on file  . Lack of Transportation (Non-Medical): Not on file  Physical Activity:   . Days of Exercise per Week: Not on file  . Minutes of Exercise per Session: Not on file  Stress:   . Feeling of Stress : Not on file  Social Connections:   . Frequency of Communication with Friends and Family: Not on file  . Frequency of Social Gatherings with Friends and Family: Not on file  . Attends Religious Services: Not on file  . Active Member of Clubs or Organizations: Not on file  . Attends Archivist Meetings: Not on file  . Marital Status: Not on file   Family History  Problem Relation Age of Onset  . Diabetes Sister    No Known Allergies Prior to Admission medications   Medication Sig Start Date End Date Taking? Authorizing Provider   Ascorbic Acid (VITAMIN C) 100 MG tablet Take 100 mg by mouth daily.   Yes [provider]  aspirin EC 81 MG tablet Take 1 tablet (81 mg total) by mouth daily. 10/27/19  Yes Jerline Pain, MD  atorvastatin (LIPITOR) 20 MG tablet TAKE 1 TABLET BY MOUTH  DAILY AT 6PM 12/12/19  Yes Jerline Pain, MD  Blood Glucose Monitoring Suppl (ONE TOUCH ULTRA SYSTEM KIT) w/Device KIT Check blood sugar 1 time daily-DX-E11.22 04/09/16  Yes [name], MD  buPROPion (WELLBUTRIN XL) 300 MG 24 hr tablet TAKE 1 TABLET BY MOUTH  DAILY FOR MOOD, FOCUS AND  CONCENTRATION 04/09/20  Yes Liane Comber, NP  Cholecalciferol (VITAMIN D3) 125 MCG (5000 UT) CAPS Take 10,000 Units by mouth daily.    Yes [provider]  docusate sodium (COLACE) 100 MG capsule Take 1 capsule (100 mg total) by mouth 2 (two) times daily. 04/28/19  Yes Gary Fleet, PA-C  furosemide (LASIX) 40 MG tablet Take 1 tablet 2 x /day as needed  for Fluid Retention 10/13/19  Yes [name], MD  gabapentin (NEURONTIN) 800 MG tablet Take    1 tablet     3 to 4 x  /day     for Pain, Hot Flashes & Sweats 05/29/20  Yes [name], MD  glucose blood (ONETOUCH ULTRA) test strip Check blood sugar 3 times a day.MO-L07.86 04/26/19  Yes [name], MD  insulin NPH-regular Human (70-30) 100 UNIT/ML injection Inject 100 Units into the skin daily.   Yes [provider]  Lancets (ONETOUCH DELICA PLUS [id number]) MISC CHECK BLOOD SUGAR 3 TIMES  DAILY 08/16/19  Yes Liane Comber, NP  Magnesium 250 MG TABS Take 250 mg by mouth daily.    Yes [provider]  metFORMIN (GLUCOPHAGE-XR) 500 MG 24 hr tablet Take    2 tablets    2 x /day     with Meals for Diabetes 05/29/20  Yes [name], MD  metoprolol succinate (TOPROL-XL) 25 MG 24 hr tablet TAKE ONE-HALF TABLET BY  MOUTH DAILY WITH OR  IMMEDIATELY FOLLOWING A  MEAL. 06/18/20  Yes Jerline Pain, MD  montelukast (SINGULAIR) 10 MG tablet Take 1 tablet     Daily      for  Allergies 05/18/20  Yes [name], MD  Multiple Vitamin (MULTIVITAMIN WITH MINERALS) TABS tablet Take 1 tablet by mouth daily.   Yes [provider]  nitroGLYCERIN (NITROSTAT) 0.4 MG SL tablet PLACE 1 TABLET UNDER THE TONGUE EVERY 5 MINUTES AS NEEDED FOR CHEST PAIN Patient taking differently: Place 0.4 mg under the tongue every  5 (five) minutes as needed for chest pain. PLACE 1 TABLET UNDER THE TONGUE EVERY 5 MINUTES AS NEEDED FOR CHEST PAIN 05/02/18  Yes [name], MD  pantoprazole (PROTONIX) 40 MG tablet TAKE 1 TABLET BY MOUTH  DAILY 06/14/19  Yes Liane Comber, NP  PARoxetine (PAXIL) 20 MG tablet Take 1 tablet Daily to Prevent Hot Flashes & Sweats 10/13/19  Yes [name], MD  Polyethyl Glycol-Propyl Glycol (SYSTANE) 0.4-0.3 % SOLN Place 2 drops into both eyes daily as needed (for dry eyes).   Yes [provider]  tamsulosin (FLOMAX) 0.4 MG CAPS capsule Take 0.4 mg by mouth at bedtime.   Yes [provider]  venlafaxine XR (EFFEXOR XR) 150 MG 24 hr capsule Take 1 capsule Daily to prevent Sweats 01/31/20  Yes [name], MD  Zinc 30 MG TABS Take 30 mg by mouth daily.   Yes [provider]     Positive ROS: Otherwise negative  All other systems have been reviewed and were otherwise negative with the exception of those mentioned in the HPI and as above.  Physical Exam: Constitutional: Alert, well-appearing, no acute distress Ears: External ears without lesions or tenderness. Ear canals are clear bilaterally.  TMs clear bilaterally. Nasal: External nose without lesions.. Clear nasal passages Oral: Lips and gums without lesions. Tongue and palate mucosa without lesions. Posterior oropharynx clear. Neck: No palpable adenopathy or masses Respiratory: Breathing comfortably  Skin: No facial/neck lesions or rash noted.  Audiogram demonstrated a mild to moderate downsloping sensorineural hearing loss in both ears which was symmetric.  SRT's  were 25 and 35 dB.  Procedures  Assessment: Bilateral sensorineural hearing loss which is consistent with presbycusis.  He has secondary tinnitus to this.  Plan: He would be a candidate for hearing aids and briefly discussed this with him. Concerning the tinnitus there is not great treatment for the tinnitus but discussed with him concerning using masking noise when the tinnitus is bothersome.   Radene Journey, MD   CC:

## 2020-06-20 ENCOUNTER — Encounter (INDEPENDENT_AMBULATORY_CARE_PROVIDER_SITE_OTHER): Payer: Self-pay

## 2020-07-01 ENCOUNTER — Ambulatory Visit (INDEPENDENT_AMBULATORY_CARE_PROVIDER_SITE_OTHER): Payer: Medicare Other | Admitting: *Deleted

## 2020-07-01 ENCOUNTER — Other Ambulatory Visit: Payer: Self-pay

## 2020-07-01 DIAGNOSIS — Z471 Aftercare following joint replacement surgery: Secondary | ICD-10-CM | POA: Diagnosis not present

## 2020-07-01 DIAGNOSIS — Z23 Encounter for immunization: Secondary | ICD-10-CM

## 2020-07-01 DIAGNOSIS — M25511 Pain in right shoulder: Secondary | ICD-10-CM | POA: Diagnosis not present

## 2020-07-01 DIAGNOSIS — Z96641 Presence of right artificial hip joint: Secondary | ICD-10-CM | POA: Diagnosis not present

## 2020-07-08 ENCOUNTER — Other Ambulatory Visit: Payer: Self-pay | Admitting: Adult Health

## 2020-07-08 ENCOUNTER — Other Ambulatory Visit: Payer: Self-pay | Admitting: Internal Medicine

## 2020-07-16 ENCOUNTER — Encounter: Payer: Self-pay | Admitting: Internal Medicine

## 2020-07-18 ENCOUNTER — Ambulatory Visit (INDEPENDENT_AMBULATORY_CARE_PROVIDER_SITE_OTHER): Payer: Medicare Other | Admitting: Adult Health Nurse Practitioner

## 2020-07-18 ENCOUNTER — Other Ambulatory Visit: Payer: Self-pay

## 2020-07-18 ENCOUNTER — Encounter: Payer: Self-pay | Admitting: Adult Health Nurse Practitioner

## 2020-07-18 VITALS — BP 130/80 | HR 77 | Temp 97.6°F | Ht 69.0 in | Wt 214.0 lb

## 2020-07-18 DIAGNOSIS — Z79899 Other long term (current) drug therapy: Secondary | ICD-10-CM

## 2020-07-18 DIAGNOSIS — E559 Vitamin D deficiency, unspecified: Secondary | ICD-10-CM

## 2020-07-18 DIAGNOSIS — E1142 Type 2 diabetes mellitus with diabetic polyneuropathy: Secondary | ICD-10-CM | POA: Diagnosis not present

## 2020-07-18 DIAGNOSIS — Z0000 Encounter for general adult medical examination without abnormal findings: Secondary | ICD-10-CM

## 2020-07-18 DIAGNOSIS — Z0001 Encounter for general adult medical examination with abnormal findings: Secondary | ICD-10-CM

## 2020-07-18 DIAGNOSIS — Z794 Long term (current) use of insulin: Secondary | ICD-10-CM

## 2020-07-18 DIAGNOSIS — E1169 Type 2 diabetes mellitus with other specified complication: Secondary | ICD-10-CM

## 2020-07-18 DIAGNOSIS — R61 Generalized hyperhidrosis: Secondary | ICD-10-CM

## 2020-07-18 DIAGNOSIS — I10 Essential (primary) hypertension: Secondary | ICD-10-CM

## 2020-07-18 DIAGNOSIS — K219 Gastro-esophageal reflux disease without esophagitis: Secondary | ICD-10-CM

## 2020-07-18 DIAGNOSIS — E1122 Type 2 diabetes mellitus with diabetic chronic kidney disease: Secondary | ICD-10-CM | POA: Diagnosis not present

## 2020-07-18 DIAGNOSIS — N182 Chronic kidney disease, stage 2 (mild): Secondary | ICD-10-CM | POA: Diagnosis not present

## 2020-07-18 DIAGNOSIS — E785 Hyperlipidemia, unspecified: Secondary | ICD-10-CM

## 2020-07-18 DIAGNOSIS — I2510 Atherosclerotic heart disease of native coronary artery without angina pectoris: Secondary | ICD-10-CM

## 2020-07-18 DIAGNOSIS — K760 Fatty (change of) liver, not elsewhere classified: Secondary | ICD-10-CM

## 2020-07-18 DIAGNOSIS — Z9861 Coronary angioplasty status: Secondary | ICD-10-CM

## 2020-07-18 DIAGNOSIS — Z87891 Personal history of nicotine dependence: Secondary | ICD-10-CM

## 2020-07-18 DIAGNOSIS — R6889 Other general symptoms and signs: Secondary | ICD-10-CM | POA: Diagnosis not present

## 2020-07-18 DIAGNOSIS — M159 Polyosteoarthritis, unspecified: Secondary | ICD-10-CM

## 2020-07-18 DIAGNOSIS — E069 Thyroiditis, unspecified: Secondary | ICD-10-CM

## 2020-07-18 DIAGNOSIS — Z8546 Personal history of malignant neoplasm of prostate: Secondary | ICD-10-CM

## 2020-07-18 DIAGNOSIS — M150 Primary generalized (osteo)arthritis: Secondary | ICD-10-CM

## 2020-07-18 DIAGNOSIS — I700 Atherosclerosis of aorta: Secondary | ICD-10-CM

## 2020-07-18 DIAGNOSIS — M8949 Other hypertrophic osteoarthropathy, multiple sites: Secondary | ICD-10-CM

## 2020-07-18 DIAGNOSIS — M1610 Unilateral primary osteoarthritis, unspecified hip: Secondary | ICD-10-CM

## 2020-07-18 MED ORDER — GABAPENTIN 800 MG PO TABS: ORAL_TABLET | ORAL | 0 refills | Status: DC

## 2020-07-18 MED ORDER — VENLAFAXINE HCL ER 150 MG PO CP24: ORAL_CAPSULE | ORAL | 1 refills | Status: DC

## 2020-07-18 NOTE — Progress Notes (Signed)
MEDICARE ANNUAL WELLNESS VISIT AND FOLLOW UP   Assessment:    Encounter for Medicare annual wellness exam Yearly  Essential hypertension - continue medications, DASH diet, exercise and monitor at home. Call if greater than 130/80.  -     CBC with Differential/Platelet -     CMP/GFR  Type 2 diabetes mellitus with stage 2 chronic kidney disease, with long-term current use of insulin (HCC) Doing well NPH 70/30 injecting 40units BID Monitor injection sites Education: Reviewed 'ABCs' of diabetes management (respective goals in parentheses):  A1C (<7.5), blood pressure (<130/80), and cholesterol (LDL <70) Eye Exam yearly and Dental Exam every 6 months. Dietary recommendations Physical Activity recommendations -     Hemoglobin A1c   ASCAD s/p PTCA (06/2012) Control blood pressure, cholesterol, glucose, increase exercise.  Followed by cardiology  Gastroesophageal reflux disease without esophagitis Continue PPI/H2 blocker, diet discussed  Diabetic peripheral neuropathy (HCC) Check feet daily -     Hemoglobin A1c Increase fluids, avoid NSAIDS, control sugars/BP, will monitor  Thyroiditis Monitor, normal scan 2017, followed by Dr. Cruzita Lederer -     TSH  Mixed hyperlipidemia Continue medications: Atorvastatin Discussed dietary and exercise modifications Low fat diet  Medication management Continued  Obesity Long discussion about weight loss, diet, and exercise Recommended diet heavy in fruits and veggies and low in animal meats, cheeses, and dairy products, appropriate calorie intake Patient will work on walking daily 10-15 min, twice a day if can, with wife Discussed appropriate weight for height  Follow up at next visit  Atherosclerosis of aorta [facility]) Chest Xray 12/03/2016 Control blood pressure, cholesterol, glucose, increase exercise.   Fatty infiltration of liver Weight loss advised, avoid alcohol/tylenol, will monitor LFTs  Prostate cancer Followed by Dr.  Junious Silk, treated by radiation, completed lupron  Former smoker (37 pack years, quit in 2012) Neg CXR 2020; never had low dose CT; discussed but declines at this time, interested next year  Hip arthritis S/p R hip arthroplasty by Dr. Berenice Primas  Doing well  Medication Management Continued   Further disposition pending results if labs check today. Discussed med's effects and SE's.   Over 30 minutes of face to face interview, exam, counseling, chart review, and critical decision making was performed.    Future Appointments  Date Time Provider [place]  10/30/2020 10:30 AM [name], MD [facility] None  04/16/2021 10:00 AM [name], MD [facility] None  07/21/2021 11:30 AM Garnet Sierras, NP [facility] None     Plan:   During the course of the visit the patient was educated and counseled about appropriate screening and preventive services including:    Pneumococcal vaccine   Influenza vaccine  Prevnar 13  Td vaccine  Screening electrocardiogram  Colorectal cancer screening  Diabetes screening  Glaucoma screening  Nutrition counseling    Subjective:  Michael Guzman is a 74 y.o. male who presents accompanied by his wife for Medicare Annual Wellness Visit and 3 month follow up for HTN, HLD, T2 diabetes, and vitamin D Def.  Her reports that he is having tingles down to the ankle. He reports it is bilateral and it is when he lays down or stretches out. He reports that it is a quick rapid fire and then it stops.  He is also having a noise, "wind" like bilateral.  Denies any popping or cranking.   He reports he has been to ENT for this and there was nothing they could do. He recently underwent R hip total replacement by Dr. Berenice Primas due  to chronic limiting hip pain; he is completing PT at home by himself with instructions for guidance. He is doing well.  He has hx of ASCAD s/p PTCA (06/2012), last stress test 10/2018 was normal obtained for surgical  clearance, followed by Dr. Marlou Porch;   Former smoker, 37.5 pack year history, quit in 2012; last CXR 11/2018; has never had CT lung screening; discussed today, interested in some point but not this year.  Will discuss 2022 per patient request.  hx of prostate cancer treated by radiation in 2016-2017; he is followed by Dr. [facility] q79m has scheduled in 06/2019, recently completed lupron shots this past April.   Some concern of ongoing depression; currently treated by wellbutrin 300 mg daily.. Patient states he is not depressed. He reports hasn't been able to fish due to right hip pain and is somewhat down due to this, but reports otherwise his mood is fine. Encouraged him to resume fishing and other previous activities.   BMI is Body mass index is 31.6 kg/m., he has been working on diet, eating less, PT 2 hours per week and walking and doing strength exercises daily.  Wt Readings from Last 3 Encounters:  07/18/20 214 lb (97.1 kg)  04/11/20 (!) 213 lb (96.6 kg)  12/19/19 217 lb (98.4 kg)   His blood pressure has been controlled at home, he is on toprol XL 1.25 mg daily only, day their BP is BP: 130/80 He does workout. He denies chest pain, shortness of breath, dizziness.   He is on cholesterol medication Atorvastatin 20 mg daily and denies myalgias. His cholesterol is at goal. The cholesterol last visit was:   Lab Results  Component Value Date   CHOL 122 04/11/2020   HDL 46 04/11/2020   LDLCALC 53 04/11/2020   LDLDIRECT 45.7 10/30/2013   TRIG 145 04/11/2020   CHOLHDL 2.7 04/11/2020   He has been working on diet and exercise for Diabetes with diabetic chronic kidney disease, with other circulatory complications and with diabetic polyneuropathy , he is on bASA, he is not on ACE/ARB due to low BPS, sugars in the AM are 92-160. Currently taking novolin 50 units AM, reports adjusts evening insulin, 0-50 units, cannot describe how he decides how much to take. Reports hasn't taken much in the  evening since discharge, has remained well controlled.  Also on metformin 2000 mg daily. He denies hypoglycemia , polydipsia and polyuria. He has gabapentin prescribed for intermittent LE parasthesias, takes 800 mg QID PRN, none in the last month.  Last A1C was:  Lab Results  Component Value Date   HGBA1C 6.7 (H) 04/11/2020   Lab Results  Component Value Date   GFRAA 99 04/11/2020   He has a history of thyroiditis, TSHs have been monitored closely and remained in normal range.   Lab Results  Component Value Date   TSH 0.63 04/11/2020  . Patient is on Vitamin D supplement.   Lab Results  Component Value Date   VD25OH 79 04/11/2020       Medication Review: Current Outpatient Medications on File Prior to Visit  Medication Sig Dispense Refill  . Ascorbic Acid (VITAMIN C) 100 MG tablet Take 100 mg by mouth daily.    .Marland Kitchenaspirin EC 81 MG tablet Take 1 tablet (81 mg total) by mouth daily. 90 tablet 3  . atorvastatin (LIPITOR) 20 MG tablet TAKE 1 TABLET BY MOUTH  DAILY AT 6PM 90 tablet 3  . Blood Glucose Monitoring Suppl (ONE TOUCH ULTRA SYSTEM KIT)  w/Device KIT Check blood sugar 1 time daily-DX-E11.22 1 each 0  . buPROPion (WELLBUTRIN XL) 300 MG 24 hr tablet TAKE 1 TABLET BY MOUTH  DAILY FOR MOOD, FOCUS AND  CONCENTRATION 90 tablet 3  . Cholecalciferol (VITAMIN D3) 125 MCG (5000 UT) CAPS Take 10,000 Units by mouth daily.     Marland Kitchen docusate sodium (COLACE) 100 MG capsule Take 1 capsule (100 mg total) by mouth 2 (two) times daily. 30 capsule 0  . furosemide (LASIX) 40 MG tablet Take 1 tablet 2 x /day as needed  for Fluid Retention 180 tablet 3  . glucose blood (ONETOUCH ULTRA) test strip Check blood sugar 3 times a day.DX-E11.22 300 each 1  . insulin NPH-regular Human (70-30) 100 UNIT/ML injection Inject 100 Units into the skin daily.    . Lancets (ONETOUCH DELICA PLUS [id number]) MISC CHECK BLOOD SUGAR 3 TIMES  DAILY 300 each 3  . Magnesium 250 MG TABS Take 250 mg by mouth daily.     .  metFORMIN (GLUCOPHAGE-XR) 500 MG 24 hr tablet Take    2 tablets    2 x /day     with Meals for Diabetes 360 tablet 0  . metoprolol succinate (TOPROL-XL) 25 MG 24 hr tablet TAKE ONE-HALF TABLET BY  MOUTH DAILY WITH OR  IMMEDIATELY FOLLOWING A  MEAL. 45 tablet 1  . montelukast (SINGULAIR) 10 MG tablet Take     1 tablet     Daily     for Allergies 90 tablet 0  . Multiple Vitamin (MULTIVITAMIN WITH MINERALS) TABS tablet Take 1 tablet by mouth daily.    . nitroGLYCERIN (NITROSTAT) 0.4 MG SL tablet PLACE 1 TABLET UNDER THE TONGUE EVERY 5 MINUTES AS NEEDED FOR CHEST PAIN (Patient taking differently: Place 0.4 mg under the tongue every 5 (five) minutes as needed for chest pain. PLACE 1 TABLET UNDER THE TONGUE EVERY 5 MINUTES AS NEEDED FOR CHEST PAIN) 25 tablet 6  . pantoprazole (PROTONIX) 40 MG tablet Take     1 tablet     Daily       to Prevent Heartburn & Indigestion 90 tablet 0  . Polyethyl Glycol-Propyl Glycol (SYSTANE) 0.4-0.3 % SOLN Place 2 drops into both eyes daily as needed (for dry eyes).    . tamsulosin (FLOMAX) 0.4 MG CAPS capsule Take 0.4 mg by mouth at bedtime.    . Zinc 30 MG TABS Take 30 mg by mouth daily.     No current facility-administered medications on file prior to visit.    Allergies: No Known Allergies  Current Problems (verified) has Hypertension (1991); GERD; ASCAD s/p PTCA (06/2012); Hyperlipidemia associated with type 2 diabetes mellitus ([place]); Vitamin D deficiency; Medication management; Diabetic peripheral neuropathy ([place]); Malignant neoplasm of prostate ([place]); Thyroiditis; Obesity (BMI 30.0-34.9); Type 2 diabetes mellitus with vascular disease ([place]); Aortic atherosclerosis ([place]); Microalbuminuric diabetic nephropathy ([place]); Hypertriglyceridemia; Fatty infiltration of liver; Former smoker; and Primary osteoarthritis of hip on their problem list.  Screening Tests Immunization History  Administered Date(s) Administered  . Influenza Split 06/26/2012  . Influenza, High Dose  Seasonal PF 06/20/2015, 06/23/2016, 06/14/2017, 06/14/2019, 07/01/2020  . Influenza-Unspecified 06/14/2013, 06/25/2014, 06/03/2018  . Pneumococcal Conjugate-13 04/23/2016  . Pneumococcal Polysaccharide-23 06/14/2017  . Pneumococcal-Unspecified 06/16/2010  . Td 09/17/2003  . Tdap 11/26/2013  . Zoster 07/12/2012    Preventative care: Last colonoscopy: 2011 due 2021 with Dr. Cristina Gong Echo 01/2018 Stress test 2017, 10/2018 Bone scan 04/2016 Thyroid 2017 Ct head 2018   Last CXR 11/2018 former smoker,  declines CT today but will consider   Prior vaccinations: TD or Tdap: 2015  Influenza: 2019 DUE, given today   Pneumococcal: 2011, 2018 Prevnar13: 2017 Shingles/Zostavax: 2013, Discussed   Names of Other Physician/Practitioners you currently use: 1. [facility] here for primary care 2. Dr. Gershon Crane, eye doctor, last visit August 07/2019 - report verified and abstracted, goes 3 times per year 3. [facility], dentist, last visit 2020  Patient Care Team: [name], MD as PCP - General (Internal Medicine) Jerline Pain, MD as PCP - Cardiology (Cardiology)  Surgical: He  has a past surgical history that includes Appendectomy; Cardiac surgery (~2017); left heart catheterization with coronary angiogram (N/A, 06/27/2012); left heart catheterization with coronary angiogram (N/A, 07/25/2012); Colonoscopy (02/12/2010); Radiology with anesthesia (N/A, 12/03/2015); Prostate biopsy; and Total hip arthroplasty (Right, 04/28/2019). Family His family history includes Diabetes in his sister. Social history  He reports that he quit smoking about 8 years ago. He started smoking about 61 years ago. He has a 37.50 pack-year smoking history. He has never used smokeless tobacco. He reports previous alcohol use. He reports that he does not use drugs.  MEDICARE WELLNESS OBJECTIVES: Physical activity: Current Exercise Habits: Home exercise routine, Type of  exercise: walking, Time (Minutes): 30, Frequency (Times/Week): 5, Weekly Exercise (Minutes/Week): 150, Intensity: Mild, Exercise limited by: orthopedic condition(s) Cardiac risk factors: Cardiac Risk Factors include: advanced age (>99mn, >>29women);diabetes mellitus;dyslipidemia;hypertension;male gender;obesity (BMI >30kg/m2) Depression/mood screen:   Dep[facility] 07/18/2020  Decreased Interest 0  Down, Depressed, Hopeless 0  PHQ - 2 Score 0  Altered sleeping -  Tired, decreased energy -  Change in appetite -  Feeling bad or failure about yourself  -  Trouble concentrating -  Moving slowly or fidgety/restless -  Suicidal thoughts -  PHQ-9 Score -  Difficult doing work/chores -  Some recent data might be hidden    ADLs:  In your present state of health, do you have any difficulty performing the following activities: 07/18/2020  Hearing? N  Comment ringing/wind bilaterally  Vision? N  Difficulty concentrating or making decisions? N  Walking or climbing stairs? N  Dressing or bathing? N  Doing errands, shopping? N  Preparing Food and eating ? N  Using the Toilet? N  In the past six months, have you accidently leaked urine? N  Do you have problems with loss of bowel control? N  Managing your Medications? N  Managing your Finances? N  Housekeeping or managing your Housekeeping? N  Some recent data might be hidden     Cognitive Testing  Alert? Yes  Normal Appearance?Yes  Oriented to person? Yes  Place? Yes   Time? Yes  Recall of three objects?  Yes  Can perform simple calculations? Yes  Displays appropriate judgment?Yes  Can read the correct time from a watch face?Yes  EOL planning: Does Patient Have a Medical Advance Directive?: Yes Type of Advance Directive: Healthcare Power of Attorney, Living will Does patient want to make changes to medical advance directive?: No - Patient declined   Objective:   Today's Vitals   07/18/20 1146  BP: 130/80  Pulse: 77   Temp: 97.6 F (36.4 C)  SpO2: 97%  Weight: 214 lb (97.1 kg)  Height: 5' 9"  (1.753 m)  PainSc: 0-No pain   Body mass index is 31.6 kg/m.  General appearance: alert, no distress, WD/WN, male HEENT: normocephalic, sclerae anicteric, TMs pearly, nares patent, no discharge or erythema, pharynx normal Oral  cavity: MMM, no lesions Neck: supple, no lymphadenopathy, no thyromegaly, no masses Heart: RRR, normal S1, S2, no murmurs Lungs: CTA bilaterally, no wheezes, rhonchi, or rales Abdomen: +bs, soft, non tender, non distended, no masses, no hepatomegaly, no splenomegaly Musculoskeletal: nontender, no swelling, no obvious deformity; slow sitting to standing, slow steady gait with cane  Extremities: no edema, no cyanosis, no clubbing Pulses: 2+ symmetric, upper and lower extremities, normal cap refill Neurological: alert, oriented x 3, CN2-12 intact, strength normal upper extremities and lower extremities, sensation decreased bilateral feet, DTRs 2+ throughout, no cerebellar signs Psychiatric: depressed affect, behavior normal  Medicare Attestation I have personally reviewed: The patient's medical and social history Their use of alcohol, tobacco or illicit drugs Their current medications and supplements The patient's functional ability including ADLs,fall risks, home safety risks, cognitive, and hearing and visual impairment Diet and physical activities Evidence for depression or mood disorders  The patient's weight, height, BMI, and visual acuity have been recorded in the chart.  I have made referrals, counseling, and provided education to the patient based on review of the above and I have provided the patient with a written personalized care plan for preventive services.      Garnet Sierras, Laqueta Jean, DNP [facility] 07/18/2020  1:20 PM

## 2020-07-18 NOTE — Patient Instructions (Addendum)
You are due for Colonoscopy.  Contact Dr Buccini's office for this appointment.    Michael Guzman , Thank you for taking time to come for your Medicare Wellness Visit. I appreciate your ongoing commitment to your health goals. Please review the following plan we discussed and let me know if I can assist you in the future.   These are the goals we discussed: Goals     Exercise 150 min/wk Moderate Activity     HEMOGLOBIN A1C < 7.5     Weight (lb) < 200 lb (90.7 kg)       This is a list of the screening recommended for you and due dates:  Health Maintenance  Topic Date Due   COVID-19 Vaccine (1) Never done   Colon Cancer Screening  DUE FOR 2021   Hemoglobin A1C  10/12/2020   Complete foot exam   04/10/2021   Urine Protein Check  04/11/2021   Eye exam for diabetics  06/03/2021   Tetanus Vaccine  11/27/2023   Flu Shot  Completed    Hepatitis C: One time screening is recommended by Center for Disease Control  (CDC) for  adults born from [birth year] through 1964.   Completed   Pneumonia vaccines  Completed    You have received the Zoster vaccination is 2013.    Ask insurance and pharmacy about shingrix - it is a 2 part shot that we will not be getting in the office.   Suggest getting AFTER covid vaccines, have to wait at least a month This shot can make you feel bad due to such good immune response it can trigger some inflammation so take tylenol or aleve day of or day after and plan on resting.   Can go to [email or web address] for more information  Shingrix Vaccination  Two vaccines are licensed and recommended to prevent shingles in the [place] Zoster vaccine live (ZVL, Zostavax) has been in use since 2006. Recombinant zoster vaccine (RZV, Shingrix), has been in use since 2017 and is recommended by AC[employer] as the preferred shingles vaccine.  What Everyone Should Know about Shingles Vaccine (Shingrix) One of the Recommended  Vaccines by Disease Shingles vaccination is the only way to protect against shingles and postherpetic neuralgia (PHN), the most common complication from shingles. CDC recommends that healthy adults 50 years and older get two doses of the shingles vaccine called Shingrix (recombinant zoster vaccine), separated by 2 to 6 months, to prevent shingles and the complications from the disease. Your doctor or pharmacist can give you Shingrix as a shot in your upper arm. Shingrix provides strong protection against shingles and PHN. Two doses of Shingrix is more than 90% effective at preventing shingles and PHN. Protection stays above 85% for at least the first four years after you get vaccinated. Shingrix is the preferred vaccine, over Zostavax (zoster vaccine live), a shingles vaccine in use since 2006. Zostavax may still be used to prevent shingles in healthy adults 60 years and older. For example, you could use Zostavax if a person is allergic to Shingrix, prefers Zostavax, or requests immediate vaccination and Shingrix is unavailable. Who Should Get Shingrix? Healthy adults 50 years and older should get two doses of Shingrix, separated by 2 to 6 months. You should get Shingrix even if in the past you  had shingles   received Zostavax   are not sure if you had chickenpox There is no maximum age for getting Shingrix. If you had shingles in the past, you can get Shingrix  to help prevent future occurrences of the disease. There is no specific length of time that you need to wait after having shingles before you can receive Shingrix, but generally you should make sure the shingles rash has gone away before getting vaccinated. You can get Shingrix whether or not you remember having had chickenpox in the past. Studies show that more than 99% of Americans 40 years and older have had chickenpox, even if they dont remember having the disease. Chickenpox and shingles are related because they are caused by the same  virus (varicella zoster virus). After a person recovers from chickenpox, the virus stays dormant (inactive) in the body. It can reactivate years later and cause shingles. If you had Zostavax in the recent past, you should wait at least eight weeks before getting Shingrix. Talk to your healthcare provider to determine the best time to get Shingrix. Shingrix is available in doctors offices and pharmacies. To find doctors offices or pharmacies near you that offer the vaccine, visit HealthMap Vaccine FinderExternal. If you have questions about Shingrix, talk with your healthcare provider. Vaccine for Those 75 Years and Older  Shingrix reduces the risk of shingles and PHN by more than 90% in people 40 and older. CDC recommends the vaccine for healthy adults 21 and older.  Who Should Not Get Shingrix? You should not get Shingrix if you:  have ever had a severe allergic reaction to any component of the vaccine or after a dose of Shingrix   tested negative for immunity to varicella zoster virus. If you test negative, you should get chickenpox vaccine.   currently have shingles   currently are pregnant or breastfeeding. Women who are pregnant or breastfeeding should wait to get Shingrix.   receive specific antiviral drugs (acyclovir, famciclovir, or valacyclovir) 24 hours before vaccination (avoid use of these antiviral drugs for 14 days after vaccination)- zoster vaccine live only If you have a minor acute (starts suddenly) illness, such as a cold, you may get Shingrix. But if you have a moderate or severe acute illness, you should usually wait until you recover before getting the vaccine. This includes anyone with a temperature of 101.44F or higher. The side effects of the Shingrix are temporary, and usually last 2 to 3 days. While you may experience pain for a few days after getting Shingrix, the pain will be less severe than having shingles and the complications from the disease. How Well Does  Shingrix Work? Two doses of Shingrix provides strong protection against shingles and postherpetic neuralgia (PHN), the most common complication of shingles.  In adults 73 to 73 years old who got two doses, Shingrix was 97% effective in preventing shingles; among adults 70 years and older, Shingrix was 91% effective.   In adults 61 to 73 years old who got two doses, Shingrix was 91% effective in preventing PHN; among adults 70 years and older, Shingrix was 89% effective. Shingrix protection remained high (more than 85%) in people 70 years and older throughout the four years following vaccination. Since your risk of shingles and PHN increases as you get older, it is important to have strong protection against shingles in your older years. Top of Page  What Are the Possible Side Effects of Shingrix? Studies show that Shingrix is safe. The vaccine helps your body create a strong defense against shingles. As a result, you are likely to have temporary side effects from getting the shots. The side effects may affect your ability to do normal daily activities for  2 to 3 days. Most people got a sore arm with mild or moderate pain after getting Shingrix, and some also had redness and swelling where they got the shot. Some people felt tired, had muscle pain, a headache, shivering, fever, stomach pain, or nausea. About 1 out of 6 people who got Shingrix experienced side effects that prevented them from doing regular activities. Symptoms went away on their own in about 2 to 3 days. Side effects were more common in younger people. You might have a reaction to the first or second dose of Shingrix, or both doses. If you experience side effects, you may choose to take over-the-counter pain medicine such as ibuprofen or acetaminophen. If you experience side effects from Shingrix, you should report them to the Vaccine Adverse Event Reporting System (VAERS). Your doctor might file this report, or you can do it yourself  through the VAERS websiteExternal, or by calling [phone]. If you have any questions about side effects from Shingrix, talk with your doctor. The shingles vaccine does not contain thimerosal (a preservative containing mercury). Top of Page  When Should I See a Doctor Because of the Side Effects I Experience From Shingrix? In clinical trials, Shingrix was not associated with serious adverse events. In fact, serious side effects from vaccines are extremely rare. For example, for every 1 million doses of a vaccine given, only one or two people may have a severe allergic reaction. Signs of an allergic reaction happen within minutes or hours after vaccination and include hives, swelling of the face and throat, difficulty breathing, a fast heartbeat, dizziness, or weakness. If you experience these or any other life-threatening symptoms, see a doctor right away. Shingrix causes a strong response in your immune system, so it may produce short-term side effects more intense than you are used to from other vaccines. These side effects can be uncomfortable, but they are expected and usually go away on their own in 2 or 3 days. Top of Page  How Can I Pay For Shingrix? There are several ways shingles vaccine may be paid for: Medicare  Medicare Part D plans cover the shingles vaccine, but there may be a cost to you depending on your plan. There may be a copay for the vaccine, or you may need to pay in full then get reimbursed for a certain amount.   Medicare Part B does not cover the shingles vaccine. Medicaid  Medicaid may or may not cover the vaccine. Contact your insurer to find out. Private health insurance  Many private health insurance plans will cover the vaccine, but there may be a cost to you depending on your plan. Contact your insurer to find out. Vaccine assistance programs  Some pharmaceutical companies provide vaccines to eligible adults who cannot afford them. You may want to check with  the vaccine manufacturer, [employer], about Shingrix. If you do not currently have health insurance, learn more about affordable health coverage optionsExternal. To find doctors offices or pharmacies near you that offer the vaccine, visit HealthMap Vaccine FinderExternal.

## 2020-07-19 LAB — COMPLETE METABOLIC PANEL WITH GFR
AG Ratio: 1.8 (calc) (ref 1.0–2.5)
ALT: 31 U/L (ref 9–46)
AST: 27 U/L (ref 10–35)
Albumin: 4.5 g/dL (ref 3.6–5.1)
Alkaline phosphatase (APISO): 74 U/L (ref 35–144)
BUN: 13 mg/dL (ref 7–25)
CO2: 26 mmol/L (ref 20–32)
Calcium: 10 mg/dL (ref 8.6–10.3)
Chloride: 100 mmol/L (ref 98–110)
Creat: 1.02 mg/dL (ref 0.70–1.18)
GFR, Est African American: 84 mL/min/{1.73_m2} (ref >=60)
GFR, Est Non African American: 73 mL/min/{1.73_m2} (ref >=60)
Globulin: 2.5 g/dL (calc) (ref 1.9–3.7)
Glucose, Bld: 81 mg/dL (ref 65–99)
Potassium: 4.6 mmol/L (ref 3.5–5.3)
Sodium: 136 mmol/L (ref 135–146)
Total Bilirubin: 0.3 mg/dL (ref 0.2–1.2)
Total Protein: 7 g/dL (ref 6.1–8.1)

## 2020-07-19 LAB — CBC WITH DIFFERENTIAL/PLATELET
Absolute Monocytes: 675 cells/uL (ref 200–950)
Basophils Absolute: 59 cells/uL (ref 0–200)
Basophils Relative: 1.1 %
Eosinophils Absolute: 248 cells/uL (ref 15–500)
Eosinophils Relative: 4.6 %
HCT: 39.4 % (ref 38.5–50.0)
Hemoglobin: 13.2 g/dL (ref 13.2–17.1)
Lymphs Abs: 1355 cells/uL (ref 850–3900)
MCH: 29.7 pg (ref 27.0–33.0)
MCHC: 33.5 g/dL (ref 32.0–36.0)
MCV: 88.7 fL (ref 80.0–100.0)
MPV: 10.8 fL (ref 7.5–12.5)
Monocytes Relative: 12.5 %
Neutro Abs: 3062 cells/uL (ref 1500–7800)
Neutrophils Relative %: 56.7 %
Platelets: 243 10*3/uL (ref 140–400)
RBC: 4.44 10*6/uL (ref 4.20–5.80)
RDW: 12.1 % (ref 11.0–15.0)
Total Lymphocyte: 25.1 %
WBC: 5.4 10*3/uL (ref 3.8–10.8)

## 2020-07-19 LAB — LIPID PANEL
Cholesterol: 117 mg/dL (ref <200)
HDL: 48 mg/dL (ref >=40)
LDL Cholesterol (Calc): 36 mg/dL (calc)
Non-HDL Cholesterol (Calc): 69 mg/dL (calc) (ref <130)
Total CHOL/HDL Ratio: 2.4 (calc) (ref <5.0)
Triglycerides: 277 mg/dL — ABNORMAL HIGH (ref <150)

## 2020-07-19 LAB — HEMOGLOBIN A1C
Hgb A1c MFr Bld: 6.8 % of total Hgb — ABNORMAL HIGH (ref <5.7)
Mean Plasma Glucose: 148 (calc)
eAG (mmol/L): 8.2 (calc)

## 2020-07-23 ENCOUNTER — Other Ambulatory Visit: Payer: Self-pay | Admitting: Internal Medicine

## 2020-07-23 DIAGNOSIS — E119 Type 2 diabetes mellitus without complications: Secondary | ICD-10-CM

## 2020-07-23 MED ORDER — INSULIN NPH ISOPHANE & REGULAR (70-30) 100 UNIT/ML ~~LOC~~ SUSP: SUBCUTANEOUS | 0 refills | Status: DC

## 2020-07-23 MED ORDER — INSULIN SYRINGES (DISPOSABLE) U-100 1 ML MISC: 50.0000 [IU] | Freq: Two times a day (BID) | 3 refills | Status: DC

## 2020-07-24 ENCOUNTER — Other Ambulatory Visit: Payer: Self-pay

## 2020-07-24 DIAGNOSIS — Z794 Long term (current) use of insulin: Secondary | ICD-10-CM

## 2020-07-24 DIAGNOSIS — E119 Type 2 diabetes mellitus without complications: Secondary | ICD-10-CM

## 2020-07-24 MED ORDER — INSULIN NPH ISOPHANE & REGULAR (70-30) 100 UNIT/ML ~~LOC~~ SUSP: SUBCUTANEOUS | 0 refills | Status: DC

## 2020-07-24 NOTE — Progress Notes (Signed)
Patient is aware of lab results. -e welch

## 2020-07-31 ENCOUNTER — Other Ambulatory Visit: Payer: Self-pay | Admitting: Internal Medicine

## 2020-07-31 DIAGNOSIS — R61 Generalized hyperhidrosis: Secondary | ICD-10-CM

## 2020-07-31 DIAGNOSIS — M159 Polyosteoarthritis, unspecified: Secondary | ICD-10-CM

## 2020-07-31 DIAGNOSIS — M8949 Other hypertrophic osteoarthropathy, multiple sites: Secondary | ICD-10-CM

## 2020-07-31 DIAGNOSIS — E119 Type 2 diabetes mellitus without complications: Secondary | ICD-10-CM

## 2020-09-02 DIAGNOSIS — H401131 Primary open-angle glaucoma, bilateral, mild stage: Secondary | ICD-10-CM | POA: Diagnosis not present

## 2020-09-21 ENCOUNTER — Other Ambulatory Visit: Payer: Self-pay | Admitting: Internal Medicine

## 2020-09-25 ENCOUNTER — Other Ambulatory Visit: Payer: Self-pay | Admitting: Internal Medicine

## 2020-09-25 DIAGNOSIS — E119 Type 2 diabetes mellitus without complications: Secondary | ICD-10-CM

## 2020-09-25 DIAGNOSIS — Z794 Long term (current) use of insulin: Secondary | ICD-10-CM

## 2020-10-02 ENCOUNTER — Other Ambulatory Visit: Payer: Self-pay | Admitting: Internal Medicine

## 2020-10-26 ENCOUNTER — Other Ambulatory Visit: Payer: Self-pay | Admitting: Internal Medicine

## 2020-10-30 ENCOUNTER — Other Ambulatory Visit: Payer: Self-pay

## 2020-10-30 ENCOUNTER — Encounter: Payer: Self-pay | Admitting: Internal Medicine

## 2020-10-30 ENCOUNTER — Ambulatory Visit (INDEPENDENT_AMBULATORY_CARE_PROVIDER_SITE_OTHER): Payer: Medicare Other | Admitting: Internal Medicine

## 2020-10-30 VITALS — BP 126/88 | HR 70 | Temp 97.6°F | Resp 16 | Ht 69.0 in | Wt 213.8 lb

## 2020-10-30 DIAGNOSIS — I10 Essential (primary) hypertension: Secondary | ICD-10-CM | POA: Diagnosis not present

## 2020-10-30 DIAGNOSIS — Z9861 Coronary angioplasty status: Secondary | ICD-10-CM

## 2020-10-30 DIAGNOSIS — E1122 Type 2 diabetes mellitus with diabetic chronic kidney disease: Secondary | ICD-10-CM | POA: Diagnosis not present

## 2020-10-30 DIAGNOSIS — E1169 Type 2 diabetes mellitus with other specified complication: Secondary | ICD-10-CM

## 2020-10-30 DIAGNOSIS — N182 Chronic kidney disease, stage 2 (mild): Secondary | ICD-10-CM | POA: Diagnosis not present

## 2020-10-30 DIAGNOSIS — I700 Atherosclerosis of aorta: Secondary | ICD-10-CM | POA: Diagnosis not present

## 2020-10-30 DIAGNOSIS — Z79899 Other long term (current) drug therapy: Secondary | ICD-10-CM

## 2020-10-30 DIAGNOSIS — E785 Hyperlipidemia, unspecified: Secondary | ICD-10-CM | POA: Diagnosis not present

## 2020-10-30 DIAGNOSIS — I2510 Atherosclerotic heart disease of native coronary artery without angina pectoris: Secondary | ICD-10-CM | POA: Diagnosis not present

## 2020-10-30 DIAGNOSIS — Z794 Long term (current) use of insulin: Secondary | ICD-10-CM | POA: Diagnosis not present

## 2020-10-30 DIAGNOSIS — E559 Vitamin D deficiency, unspecified: Secondary | ICD-10-CM | POA: Diagnosis not present

## 2020-10-30 MED ORDER — FLUOXETINE HCL 40 MG PO CAPS: ORAL_CAPSULE | ORAL | 1 refills | Status: DC

## 2020-10-30 NOTE — Progress Notes (Signed)
History of Present Illness:       This very nice 75 y.o.  MBM presents for 6 month follow up with HTN, HLD, Pre-Diabetes and Vitamin D Deficiency.       Patient is treated for HTN (1991)  & BP has been controlled at home. Today's BP is at goal - 126/88.   Patient had PCA /Stent placed in 2013 and   Stress Lexiscan in 2020 was Normal.   Patient has had no complaints of any cardiac type chest pain, palpitations, dyspnea / orthopnea / PND, dizziness, claudication, or dependent edema.      Hyperlipidemia is controlled with diet & meds. Patient denies myalgias or other med SE's. Last Lipids were at goal except elevated Trig's:  Lab Results  Component Value Date   CHOL 117 07/18/2020   HDL 48 07/18/2020   LDLCALC 36 07/18/2020   LDLDIRECT 45.7 10/30/2013   TRIG 277 (H) 07/18/2020   CHOLHDL 2.4 07/18/2020    Also, the patient has history of T2_NIDDM (1998) w/CKD2  (GFR 77) and has had no symptoms of reactive hypoglycemia, diabetic polys, paresthesias or visual blurring.  Last A1c was          Further, the patient also has history of Vitamin D Deficiency ("15" /2008) and supplements vitamin D without any suspected side-effects. Last vitamin D was at goal:  Lab Results  Component Value Date   VD25OH 79 04/11/2020    Current Outpatient Medications on File Prior to Visit  Medication Sig  . VITAMIN C 100 MG t Take 1 daily.  Marland Kitchen aspirin EC 81 MG tablet Take 1 tablet  daily.  Marland Kitchen atorvastatin  20 MG tablet TAKE 1 TABLET B DAILY   . buPROPion-XL300 MG 2 TAKE 1 TABLET   DAILY   . VITAMIN D 5000 u Take 10,000 Units daily.   Marland Kitchen COLACE 100 MG capsule Take 1 capsule 2 times daily.  . furosemide  40 MG tablet Take 1 tablet 2 x /day as needed   . gabapentin 800 MG tablet Take 1 tablet 3-4 x /day for pain, hot flashes   . HUMULIN 70/30 INJECT 50 UNITS TWICE DAILY  . Magnesium 250 MG  Take  daily.   . metFORMIN-XR 500 MG 2 Take 2 tablets 2 x /day w/Meals   . metoprolol succinate-XL 25 MG  TAKE  1/2 TABLET  DAILY   . montelukast ) 10 MG tablet TAKE 1 TABLET  DAILY   . Multi-Vit w/Minerals Take 1 tablet by mouth daily.  Marland Kitchen NITROSTAT 0.4 MG SL   NEEDED FOR CHEST PAIN  . pantoprazole  40 MG tablet TAKE 1 TABLET BY MOUTH  DAILY T  . SYSTANE 0.4-0.3 % SOLN Place 2 drops into both eyes daily as needed   . tamsulosin  0.4 MG CAPS capsule Take 0.4 mg by mouth at bedtime.  Marland Kitchen venlafaxine XR  150 MG Take 1 capsule Daily to prevent Sweats  . Zinc 30 MG TABS Take 30 mg by mouth daily.     No Known Allergies  PMHx:   Past Medical History:  Diagnosis Date  . Arthritis   . CAD (coronary artery disease)    a.s/p DES to mid LAD and OM2 06/2012.  Marland Kitchen Chronic back pain   . Chronically dry eyes   . Diabetes mellitus without complication ([place])    TYPE 2   . DKA (diabetic ketoacidoses) 01/30/2017  . Elevated PSA    being monitored  .  Fatty liver   . GERD (gastroesophageal reflux disease)   . Hx of radiation therapy    prostate , [facility]   . Hypertension   . Hypertriglyceridemia   . Intermediate coronary syndrome ([place]) 07/26/2012  . Prostate cancer ([place])    RADTIATION     Immunization History  Administered Date(s) Administered  . Influenza Split 06/26/2012  . Influenza, High Dose Seasonal PF 06/20/2015, 06/23/2016, 06/14/2017, 06/14/2019, 07/01/2020  . Influenza-Unspecified 06/14/2013, 06/25/2014, 06/03/2018  . Pneumococcal Conjugate-13 04/23/2016  . Pneumococcal Polysaccharide-23 06/14/2017  . Pneumococcal-Unspecified 06/16/2010  . Td 09/17/2003  . Tdap 11/26/2013  . Zoster 07/12/2012    Past Surgical History:  Procedure Laterality Date  . APPENDECTOMY    . CARDIAC SURGERY  ~2017   3 stent placed.  . COLONOSCOPY  02/12/2010   Buccini  . LEFT HEART CATHETERIZATION WITH CORONARY ANGIOGRAM N/A 06/27/2012   Procedure: LEFT HEART CATHETERIZATION WITH CORONARY ANGIOGRAM;  Surgeon: Candee Furbish, MD;  Location: [facility] CATH LAB;  Service: Cardiovascular;  Laterality: N/A;   . LEFT HEART CATHETERIZATION WITH CORONARY ANGIOGRAM N/A 07/25/2012   Procedure: LEFT HEART CATHETERIZATION WITH CORONARY ANGIOGRAM;  Surgeon: Sueanne Margarita, MD;  Location: [facility] CATH LAB;  Service: Cardiovascular;  Laterality: N/A;  . PROSTATE BIOPSY    . RADIOLOGY WITH ANESTHESIA N/A 12/03/2015   Procedure: MRI LUMBAR SPINE;  Surgeon: Medication Radiologist, MD;  Location: [facility];  Service: Radiology;  Laterality: N/A;  . TOTAL HIP ARTHROPLASTY Right 04/28/2019   Procedure: TOTAL HIP ARTHROPLASTY ANTERIOR APPROACH;  Surgeon: Dorna Leitz, MD;  Location: [facility];  Service: Orthopedics;  Laterality: Right;    FHx:    Reviewed / unchanged  SHx:    Reviewed / unchanged   Systems Review:  Constitutional: Denies fever, chills, wt changes, headaches, insomnia, fatigue, night sweats, change in appetite. Eyes: Denies redness, blurred vision, diplopia, discharge, itchy, watery eyes.  ENT: Denies discharge, congestion, post nasal drip, epistaxis, sore throat, earache, hearing loss, dental pain, tinnitus, vertigo, sinus pain, snoring.  CV: Denies chest pain, palpitations, irregular heartbeat, syncope, dyspnea, diaphoresis, orthopnea, PND, claudication or edema. Respiratory: denies cough, dyspnea, DOE, pleurisy, hoarseness, laryngitis, wheezing.  Gastrointestinal: Denies dysphagia, odynophagia, heartburn, reflux, water brash, abdominal pain or cramps, nausea, vomiting, bloating, diarrhea, constipation, hematemesis, melena, hematochezia  or hemorrhoids. Genitourinary: Denies dysuria, frequency, urgency, nocturia, hesitancy, discharge, hematuria or flank pain. Musculoskeletal: Denies arthralgias, myalgias, stiffness, jt. swelling, pain, limping or strain/sprain.  Skin: Denies pruritus, rash, hives, warts, acne, eczema or change in skin lesion(s). Neuro: No weakness, tremor, incoordination, spasms, paresthesia or pain. Psychiatric: Denies confusion, memory loss or sensory loss. Endo: Denies change in weight,  skin or hair change.  Heme/Lymph: No excessive bleeding, bruising or enlarged lymph nodes.  Physical Exam  BP 126/88   Pulse 70   Temp 97.6 F (36.4 C)   Resp 16   Ht 5\' 9"  (1.753 m)   Wt 213 lb 12.8 oz (97 kg)   SpO2 98%   BMI 31.57 kg/m   Appears  well nourished, well groomed  and in no distress.  Eyes: PERRLA, EOMs, conjunctiva no swelling or erythema. Sinuses: No frontal/maxillary tenderness ENT/Mouth: EAC's clear, TM's nl w/o erythema, bulging. Nares clear w/o erythema, swelling, exudates. Oropharynx clear without erythema or exudates. Oral hygiene is good. Tongue normal, non obstructing. Hearing intact.  Neck: Supple. Thyroid not palpable. Car 2+/2+ without bruits, nodes or JVD. Chest: Respirations nl with BS clear & equal w/o rales, rhonchi, wheezing or stridor.  Cor:  Heart sounds normal w/ regular rate and rhythm without sig. murmurs, gallops, clicks or rubs. Peripheral pulses normal and equal  without edema.  Abdomen: Soft & bowel sounds normal. Non-tender w/o guarding, rebound, hernias, masses or organomegaly.  Lymphatics: Unremarkable.  Musculoskeletal: Full ROM all peripheral extremities, joint stability, 5/5 strength and normal gait.  Skin: Warm, dry without exposed rashes, lesions or ecchymosis apparent.  Neuro: Cranial nerves intact, reflexes equal bilaterally. Sensory-motor testing grossly intact. Tendon reflexes grossly intact.  Pysch: Alert & oriented x 3.  Insight and judgement nl & appropriate. No ideations.  Assessment and Plan:  1. Essential hypertension  - Continue medication, monitor blood pressure at home.  - Continue DASH diet.  Reminder to go to the ER if any CP,  SOB, nausea, dizziness, severe HA, changes vision/speech.  - CBC with Differential/Platelet - COMPLETE METABOLIC PANEL WITH GFR - Magnesium - TSH  2. Hyperlipidemia associated with type 2 diabetes mellitus ([place])  - Continue diet/meds, exercise,& lifestyle modifications.  - Continue  monitor periodic cholesterol/liver & renal functions   - Lipid panel - TSH  3. Type 2 diabetes mellitus with stage 2 chronic kidney  disease, with long-term current use of insulin ([facility])  - Continue diet, exercise  - Lifestyle modifications.  - Monitor appropriate labs.   - Hemoglobin A1c  4. Vitamin D deficiency  - Continue supplementation.  - VITAMIN D 25 Hydroxy  5. ASCAD s/p PTCA (06/2012)  - Lipid panel  6. Aortic atherosclerosis ([place]) by Abd CT scan 2015  - Lipid panel  7. Medication management  - CBC with Differential/Platelet - COMPLETE METABOLIC PANEL WITH GFR - Magnesium - Lipid panel - TSH - Hemoglobin A1c - VITAMIN D 25 Hydroxy        Discussed  regular exercise, BP monitoring, weight control to achieve/maintain BMI less than 25 and discussed med and SE's. Recommended labs to assess and monitor clinical status with further disposition pending results of labs.  I discussed the assessment and treatment plan with the patient. The patient was provided an opportunity to ask questions and all were answered. The patient agreed with the plan and demonstrated an understanding of the instructions.  I provided over 30 minutes of exam, counseling, chart review and  complex critical decision making.        The patient was advised to call back or seek an in-person evaluation if the symptoms worsen or if the condition fails to improve as anticipated.   Kirtland Bouchard, MD

## 2020-10-30 NOTE — Patient Instructions (Signed)

## 2020-10-31 LAB — MAGNESIUM: Magnesium: 1.8 mg/dL (ref 1.5–2.5)

## 2020-10-31 LAB — LIPID PANEL
Cholesterol: 125 mg/dL (ref <200)
HDL: 48 mg/dL (ref >=40)
LDL Cholesterol (Calc): 50 mg/dL (calc)
Non-HDL Cholesterol (Calc): 77 mg/dL (calc) (ref <130)
Total CHOL/HDL Ratio: 2.6 (calc) (ref <5.0)
Triglycerides: 197 mg/dL — ABNORMAL HIGH (ref <150)

## 2020-10-31 LAB — COMPLETE METABOLIC PANEL WITH GFR
AG Ratio: 1.9 (calc) (ref 1.0–2.5)
ALT: 47 U/L — ABNORMAL HIGH (ref 9–46)
AST: 44 U/L — ABNORMAL HIGH (ref 10–35)
Albumin: 4.6 g/dL (ref 3.6–5.1)
Alkaline phosphatase (APISO): 87 U/L (ref 35–144)
BUN: 16 mg/dL (ref 7–25)
CO2: 26 mmol/L (ref 20–32)
Calcium: 9.4 mg/dL (ref 8.6–10.3)
Chloride: 102 mmol/L (ref 98–110)
Creat: 1.02 mg/dL (ref 0.70–1.18)
GFR, Est African American: 84 mL/min/{1.73_m2} (ref >=60)
GFR, Est Non African American: 73 mL/min/{1.73_m2} (ref >=60)
Globulin: 2.4 g/dL (calc) (ref 1.9–3.7)
Glucose, Bld: 263 mg/dL — ABNORMAL HIGH (ref 65–99)
Potassium: 4.2 mmol/L (ref 3.5–5.3)
Sodium: 138 mmol/L (ref 135–146)
Total Bilirubin: 0.3 mg/dL (ref 0.2–1.2)
Total Protein: 7 g/dL (ref 6.1–8.1)

## 2020-10-31 LAB — CBC WITH DIFFERENTIAL/PLATELET
Absolute Monocytes: 514 cells/uL (ref 200–950)
Basophils Absolute: 29 cells/uL (ref 0–200)
Basophils Relative: 0.6 %
Eosinophils Absolute: 192 cells/uL (ref 15–500)
Eosinophils Relative: 4 %
HCT: 38.4 % — ABNORMAL LOW (ref 38.5–50.0)
Hemoglobin: 12.9 g/dL — ABNORMAL LOW (ref 13.2–17.1)
Lymphs Abs: 1368 cells/uL (ref 850–3900)
MCH: 29.8 pg (ref 27.0–33.0)
MCHC: 33.6 g/dL (ref 32.0–36.0)
MCV: 88.7 fL (ref 80.0–100.0)
MPV: 11.4 fL (ref 7.5–12.5)
Monocytes Relative: 10.7 %
Neutro Abs: 2698 cells/uL (ref 1500–7800)
Neutrophils Relative %: 56.2 %
Platelets: 172 10*3/uL (ref 140–400)
RBC: 4.33 10*6/uL (ref 4.20–5.80)
RDW: 12.9 % (ref 11.0–15.0)
Total Lymphocyte: 28.5 %
WBC: 4.8 10*3/uL (ref 3.8–10.8)

## 2020-10-31 LAB — HEMOGLOBIN A1C
Hgb A1c MFr Bld: 6.7 % of total Hgb — ABNORMAL HIGH (ref <5.7)
Mean Plasma Glucose: 146 mg/dL
eAG (mmol/L): 8.1 mmol/L

## 2020-10-31 LAB — TSH: TSH: 0.7 mIU/L (ref 0.40–4.50)

## 2020-10-31 LAB — VITAMIN D 25 HYDROXY (VIT D DEFICIENCY, FRACTURES): Vit D, 25-Hydroxy: 106 ng/mL — ABNORMAL HIGH (ref 30–100)

## 2020-10-31 LAB — INSULIN, RANDOM: Insulin: 65.2 u[IU]/mL — ABNORMAL HIGH

## 2020-10-31 NOTE — Progress Notes (Signed)
=========================================================== ===========================================================  -   Glucose = 263 mg% is way too high  (Ideal or Goal is less than 140 mg%)   - and   -  A1c = 6.7% - Also too high  (Ideal or Goal is less than 6.0% =========================================================== ===========================================================  - So . . . . .  Need to work a lot harder on Dieting & Weight Loss   -  Being diabetic has a  300% increased risk for heart attack,  stroke, cancer, and alzheimer- type vascular Dementia.   -   It is very important that you work harder with diet by  avoiding all foods that are white except chicken,   fish & calliflower.  - Avoid white rice  (brown & wild rice is OK),   - Avoid white potatoes  (sweet potatoes in moderation is OK),   White bread or wheat bread or anything made out of   white flour like bagels, donuts, rolls, buns, biscuits, cakes,  - pastries, cookies, pizza crust, and pasta (made from  white flour & egg whites)   - vegetarian pasta or spinach or wheat pasta is OK.  - Multigrain breads like Arnold's, Pepperidge Farm or   multigrain sandwich thins or high fiber breads like   [place] bread or "Dave's Killer" breads that are  4 to 5 grams fiber per slice !  are best.    Diet, exercise and weight loss can reverse and cure  diabetes in the early stages.    - Diet, exercise and weight loss is very important in the   control and prevention of complications of diabetes which  affects every system in your body, ie.   -Brain - dementia/stroke,  - eyes - glaucoma/blindness,  - heart - heart attack/heart failure,  - kidneys - dialysis,  - stomach - gastric paralysis,  - intestines - malabsorption,  - nerves - severe painful neuritis,  - circulation - gangrene & loss of a leg(s)  - and finally  . . . . . . . . . . . . . . . . . .    - cancer and  Alzheimers. =========================================================== ===========================================================  - Total Chol = 125 and LDL Chol = 50 - Both  Excellent   - Very low risk for Heart Attack  / Stroke  - So . . . . .  Suggest that you cut your Atorvastatin /Lipitor tab in 1/2   ========================================================  - Vitamin D = 106 - Excellent - Please keep dose Same   =========================================================== ===========================================================  - All Else - CBC - Kidneys - Electrolytes - Liver - Magnesium & Thyroid    - all  Normal / OK ===========================================================   - All Else - CBC - Kidneys - Electrolytes - Magnesium & Thyroid    - all  Normal / OK ===========================================================  ===========================================================

## 2020-11-13 NOTE — Progress Notes (Signed)
pansinus   History of Present Illness:      This very nice 75 y.o.  MBM with HTN,ASCAD/Stent,  HLD,  T2_Ins req DM  and Vitamin D Deficiency presents with 10 day prodrome of head & chest congestion denies fever, chills or dyspnea. Covid 19 test is negative.   Medications  Current Outpatient Medications (Endocrine & Metabolic):  .  insulin NPH-regular Human (HUMULIN 70/30) (70-30) 100 UNIT/ML injection, INJECT 50 UNITS INTO SKIN  TWICE DAILY .  metFORMIN (GLUCOPHAGE-XR) 500 MG 24 hr tablet, TAKE 2 TABLETS BY MOUTH  TWICE DAILY WITH MEALS FOR  DIABETES  Current Outpatient Medications (Cardiovascular):  .  atorvastatin (LIPITOR) 20 MG tablet, TAKE 1 TABLET BY MOUTH  DAILY AT 6PM .  furosemide (LASIX) 40 MG tablet, Take 1 tablet 2 x /day as needed  for Fluid Retention .  metoprolol succinate (TOPROL-XL) 25 MG 24 hr tablet, TAKE ONE-HALF TABLET BY  MOUTH DAILY WITH OR  IMMEDIATELY FOLLOWING A  MEAL. .  nitroGLYCERIN (NITROSTAT) 0.4 MG SL tablet, PLACE 1 TABLET UNDER THE TONGUE EVERY 5 MINUTES AS NEEDED FOR CHEST PAIN (Patient taking differently: Place 0.4 mg under the tongue every 5 (five) minutes as needed for chest pain. PLACE 1 TABLET UNDER THE TONGUE EVERY 5 MINUTES AS NEEDED FOR CHEST PAIN)  Current Outpatient Medications (Respiratory):  .  montelukast (SINGULAIR) 10 MG tablet, TAKE 1 TABLET BY MOUTH  DAILY FOR ALLERGIES  Current Outpatient Medications (Analgesics):  .  aspirin EC 81 MG tablet, Take 1 tablet (81 mg total) by mouth daily.   Current Outpatient Medications (Other):  Marland Kitchen  Ascorbic Acid (VITAMIN C) 100 MG tablet, Take 100 mg by mouth daily. .  Blood Glucose Monitoring Suppl (ONE TOUCH ULTRA SYSTEM KIT) w/Device KIT, Check blood sugar 1 time daily-[id number] .  buPROPion (WELLBUTRIN XL) 300 MG 24 hr tablet, TAKE 1 TABLET BY MOUTH  DAILY FOR MOOD, FOCUS AND  CONCENTRATION .  Cholecalciferol (VITAMIN D3) 125 MCG (5000 UT) CAPS, Take 10,000 Units by mouth daily.  Marland Kitchen  docusate  sodium (COLACE) 100 MG capsule, Take 1 capsule (100 mg total) by mouth 2 (two) times daily. Marland Kitchen  FLUoxetine (PROZAC) 40 MG capsule, Take  1 capsule  Daily for Mood, Anxiety & Hot Flashes .  gabapentin (NEURONTIN) 800 MG tablet, TAKE 1 TABLET BY MOUTH 3 TO 4 TIMES DAILY FOR PAIN, HOT FLASHES AND SWEATS .  glucose blood (ONETOUCH ULTRA) test strip, CHECK BLOOD SUGAR 3 TIMES  DAILY (Dx: e11.29) .  Insulin Syringes, Disposable, U-100 1 ML MISC, 50 Units by Does not apply route 2 (two) times daily. inject 50 units into skin 2 x /day .  Lancets (ONETOUCH DELICA PLUS [id number]) MISC, CHECK BLOOD SUGAR 3 TIMES  DAILY .  Magnesium 250 MG TABS, Take 250 mg by mouth daily.  .  Multiple Vitamin (MULTIVITAMIN WITH MINERALS) TABS tablet, Take 1 tablet by mouth daily. .  pantoprazole (PROTONIX) 40 MG tablet, TAKE 1 TABLET BY MOUTH  DAILY TO PREVENT HEARTBURN  AND INDIGESTION .  Polyethyl Glycol-Propyl Glycol (SYSTANE) 0.4-0.3 % SOLN, Place 2 drops into both eyes daily as needed (for dry eyes). .  tamsulosin (FLOMAX) 0.4 MG CAPS capsule, Take 0.4 mg by mouth at bedtime. Marland Kitchen  venlafaxine XR (EFFEXOR XR) 150 MG 24 hr capsule, Take 1 capsule Daily to prevent Sweats .  Zinc 30 MG TABS, Take 30 mg by mouth daily.  Problem list He has Hypertension (1991); GERD; ASCAD s/p PTCA (06/2012); Hyperlipidemia associated  with type 2 diabetes mellitus ([place]); Vitamin D deficiency; Medication management; Diabetic peripheral neuropathy ([place]); Malignant neoplasm of prostate ([place]); Thyroiditis; Obesity (BMI 30.0-34.9); Type 2 diabetes mellitus with vascular disease ([place]); Aortic atherosclerosis ([place]) by Abd CT scan 2015; Microalbuminuric diabetic nephropathy ([place]); Hypertriglyceridemia; Fatty infiltration of liver; Former smoker; and Primary osteoarthritis of hip on their problem list.   Observations/Objective:   There were no vitals taken for this visit.  HEENT - WNL. Neck - supple.  Chest - Clear equal BS. Cor - Nl HS. RRR w/o  sig MGR. PP 1(+). No edema. MS- FROM w/o deformities.  Gait Nl. Neuro -  Nl w/o focal abnormalities.  Assessment and Plan:  1. Subacute pansinusitis  - dexamethasone (DECADRON) 4 MG tablet; Take 1 tab 3 x day - 3 days, then 2 x day - 3 days, then 1 tab daily  Dispense: 20 tablet; Refill: 0  - azithromycin (ZITHROMAX) 250 MG tablet; Take 2 tablets with Food on  Day 1, then 1 tablet Daily with Food for Sinusitis / Bronchitis  Dispense: 6 each; Refill: 1   2. Bronchitis  - dexamethasone (DECADRON) 4 MG tablet; Take 1 tab 3 x day - 3 days, then 2 x day - 3 days, then 1 tab daily  Dispense: 20 tablet; Refill: 0  - azithromycin (ZITHROMAX) 250 MG tablet; Take 2 tablets with Food on  Day 1, then 1 tablet Daily with Food for Sinusitis / Bronchitis  Dispense: 6 each; Refill: 1  - promethazine-dextromethorphan (PROMETHAZINE-DM) 6.25-15 MG/5ML syrup; Take 1 tsp every 4 hours if needed for cough  Dispense: 240 mL; Refill: 1   3. Encounter for screening for COVID-19  - POC COVID-19  Follow Up Instructions:        I discussed the assessment and treatment plan with the patient. The patient was provided an opportunity to ask questions and all were answered. The patient agreed with the plan and demonstrated an understanding of the instructions.       The patient was advised to call back or seek an in-person evaluation if the symptoms worsen or if the condition fails to improve as anticipated.    Kirtland Bouchard, MD

## 2020-11-14 ENCOUNTER — Ambulatory Visit (INDEPENDENT_AMBULATORY_CARE_PROVIDER_SITE_OTHER): Payer: Medicare Other | Admitting: Internal Medicine

## 2020-11-14 ENCOUNTER — Other Ambulatory Visit: Payer: Self-pay

## 2020-11-14 VITALS — BP 122/82 | HR 67 | Temp 97.5°F | Ht 69.0 in | Wt 213.8 lb

## 2020-11-14 DIAGNOSIS — J0140 Acute pansinusitis, unspecified: Secondary | ICD-10-CM

## 2020-11-14 DIAGNOSIS — Z1152 Encounter for screening for COVID-19: Secondary | ICD-10-CM | POA: Diagnosis not present

## 2020-11-14 DIAGNOSIS — J40 Bronchitis, not specified as acute or chronic: Secondary | ICD-10-CM

## 2020-11-14 LAB — POC COVID19 BINAXNOW: SARS Coronavirus 2 Ag: NEGATIVE

## 2020-11-14 MED ORDER — AZITHROMYCIN 250 MG PO TABS: ORAL_TABLET | ORAL | 1 refills | Status: DC

## 2020-11-14 MED ORDER — DEXAMETHASONE 4 MG PO TABS: ORAL_TABLET | ORAL | 0 refills | Status: DC

## 2020-11-14 MED ORDER — PROMETHAZINE-DM 6.25-15 MG/5ML PO SYRP: ORAL_SOLUTION | ORAL | 1 refills | Status: DC

## 2020-11-16 ENCOUNTER — Encounter: Payer: Self-pay | Admitting: Internal Medicine

## 2020-12-02 DIAGNOSIS — H401131 Primary open-angle glaucoma, bilateral, mild stage: Secondary | ICD-10-CM | POA: Diagnosis not present

## 2020-12-12 ENCOUNTER — Ambulatory Visit (INDEPENDENT_AMBULATORY_CARE_PROVIDER_SITE_OTHER): Payer: Medicare Other | Admitting: Internal Medicine

## 2020-12-12 ENCOUNTER — Encounter: Payer: Self-pay | Admitting: Internal Medicine

## 2020-12-12 ENCOUNTER — Other Ambulatory Visit: Payer: Self-pay

## 2020-12-12 VITALS — BP 104/64 | HR 75 | Temp 97.9°F | Resp 16 | Ht 69.0 in | Wt 224.0 lb

## 2020-12-12 DIAGNOSIS — E1169 Type 2 diabetes mellitus with other specified complication: Secondary | ICD-10-CM

## 2020-12-12 DIAGNOSIS — Z79899 Other long term (current) drug therapy: Secondary | ICD-10-CM

## 2020-12-12 DIAGNOSIS — E785 Hyperlipidemia, unspecified: Secondary | ICD-10-CM | POA: Diagnosis not present

## 2020-12-12 DIAGNOSIS — E1122 Type 2 diabetes mellitus with diabetic chronic kidney disease: Secondary | ICD-10-CM | POA: Diagnosis not present

## 2020-12-12 DIAGNOSIS — I10 Essential (primary) hypertension: Secondary | ICD-10-CM | POA: Diagnosis not present

## 2020-12-12 DIAGNOSIS — G894 Chronic pain syndrome: Secondary | ICD-10-CM | POA: Diagnosis not present

## 2020-12-12 DIAGNOSIS — Z794 Long term (current) use of insulin: Secondary | ICD-10-CM

## 2020-12-12 DIAGNOSIS — N182 Chronic kidney disease, stage 2 (mild): Secondary | ICD-10-CM

## 2020-12-12 MED ORDER — DULOXETINE HCL 60 MG PO CPEP: ORAL_CAPSULE | ORAL | 1 refills | Status: DC

## 2020-12-12 NOTE — Patient Instructions (Signed)

## 2020-12-12 NOTE — Progress Notes (Signed)
Future Appointments  Date Time Provider [place]  04/16/2021 10:00 AM [name], MD [facility] None  07/21/2021 11:30 AM Garnet Sierras, NP [facility] None     History of Present Illness:     Patient returns & reports that he "feels better", breathing better after recent treatment. Having some difficulty adjusting  Sliding Scale for Insulin and also c/o tongue biting or chewing the right side of his tongue. Still has multiple aches & pains .      Patient returns for Short  6 week F/U with at last OV, A1c = 6.7% and Glu at  OV was  263 mg%.  Encouraged stricter diet & weight loss. (was 213# and BMI 31.57)   Wt Readings from Last 3 Encounters:  12/12/20 224 lb (101.6 kg)  11/14/20 213 lb 12.8 oz (97 kg)  10/30/20 213 lb 12.8 oz (97 kg)    Medications  Current Outpatient Medications (Endocrine & Metabolic):  .  dexamethasone (DECADRON) 4 MG tablet, Take 1 tab 3 x day - 3 days, then 2 x day - 3 days, then 1 tab daily .  insulin NPH-regular Human (HUMULIN 70/30) (70-30) 100 UNIT/ML injection, INJECT 50 UNITS INTO SKIN  TWICE DAILY .  metFORMIN (GLUCOPHAGE-XR) 500 MG 24 hr tablet, TAKE 2 TABLETS BY MOUTH  TWICE DAILY WITH MEALS FOR  DIABETES  Current Outpatient Medications (Cardiovascular):  .  atorvastatin (LIPITOR) 20 MG tablet, TAKE 1 TABLET BY MOUTH  DAILY AT 6PM .  furosemide (LASIX) 40 MG tablet, Take 1 tablet 2 x /day as needed  for Fluid Retention .  metoprolol succinate (TOPROL-XL) 25 MG 24 hr tablet, TAKE ONE-HALF TABLET BY  MOUTH DAILY WITH OR  IMMEDIATELY FOLLOWING A  MEAL. .  nitroGLYCERIN (NITROSTAT) 0.4 MG SL tablet, PLACE 1 TABLET UNDER THE TONGUE EVERY 5 MINUTES AS NEEDED FOR CHEST PAIN (Patient taking differently: Place 0.4 mg under the tongue every 5 (five) minutes as needed for chest pain. PLACE 1 TABLET UNDER THE TONGUE EVERY 5 MINUTES AS NEEDED FOR CHEST PAIN) .  montelukast  10 MG tablet, TAKE 1 TABLET   DAILY FOR ALLERGIES  6.25-15 MG/5ML  syrup, Take 1 tsp every 4 hours if needed for cough .  aspirin EC 81 MG tablet, Take 1 tablet daily. Marland Kitchen VITAMIN C 100 MG tablet, Take daily. Marland Kitchen  buPROPion-XL 300TAKE 1 TABLET   DAILY  .  CVITAMIN D  5000 u, Take 10,000 Units /daily.  Marland Kitchen  docusate s 100 MG capsule, Take 1 capsule 2  times daily. Marland Kitchen  FLUoxetine  40 MG capsule, Take  1 capsule  Daily  .  gabapentin 800 MG , TAKE 1 TABLET 3 - 4 TIMES DAILY FOR HOT FLASHES & SWEATS .  Magnesium 250 MG TABS, Take  daily.  .  Multiple Vitamin w/Min, Take 1 tablet  daily. .  pantoprazole  40 MG tablet, TAKE 1 TABLET  DAILY  .  SYSTANE 0.4-0.3 % SOLN, Place 2 drops into both eyes daily as needed (for dry eyes). .  tamsulosin  0.4 MG CAPS capsule, Take at bedtime. .  Zinc 30 MG TABS, Take 3 daily.  Problem list He has Hypertension (1991); GERD; ASCAD s/p PTCA (06/2012); Hyperlipidemia associated with type 2 diabetes mellitus ([place]); Vitamin D deficiency; Medication management; Diabetic peripheral neuropathy ([place]); Malignant neoplasm of prostate ([place]); Thyroiditis; Obesity (BMI 30.0-34.9); Type 2 diabetes mellitus with vascular disease ([place]); Aortic atherosclerosis ([place]) by Abd CT scan 2015; Microalbuminuric diabetic nephropathy ([place]);  Hypertriglyceridemia; Fatty infiltration of liver; Former smoker; and Primary osteoarthritis of hip on their problem list.   Observations/Objective:  BP 104/64   Pulse 75   Temp 97.9 F (36.6 C)   Resp 16   Ht 5\' 9"  (1.753 m)   Wt 224 lb (101.6 kg)   SpO2 99%   BMI 33.08 kg/m   HEENT - WNL. Neck - supple.  Chest - Clear equal BS. Cor - Nl HS. RRR w/o sig MGR. PP 1(+). No edema. MS- FROM w/o deformities.  Gait Nl. Neuro -  Nl w/o focal abnormalities.  Assessment and Plan:  1. Essential hypertension  - COMPLETE METABOLIC PANEL WITH GFR  2. Hyperlipidemia associated with type 2 diabetes mellitus ([place])   3. Type 2 diabetes mellitus with stage 2 chronic kidney disease, with long-term current use of insulin  (H[facility])  - COMPLETE METABOLIC PANEL WITH GFR - Fructosamine  4. Medication management  - COMPLETE METABOLIC PANEL WITH GFR - Fructosamine  New Rx for Cymbalta to replace Prozac for his chronic pains    Follow Up Instructions:        I discussed the assessment and treatment plan with the patient. The patient was provided an opportunity to ask questions and all were answered. The patient agreed with the plan and demonstrated an understanding of the instructions.       The patient was advised to call back or seek an in-person evaluation if the symptoms worsen or if the condition fails to improve as anticipated.    Kirtland Bouchard, MD

## 2020-12-16 LAB — COMPLETE METABOLIC PANEL WITH GFR
AG Ratio: 1.9 (calc) (ref 1.0–2.5)
ALT: 44 U/L (ref 9–46)
AST: 38 U/L — ABNORMAL HIGH (ref 10–35)
Albumin: 4.1 g/dL (ref 3.6–5.1)
Alkaline phosphatase (APISO): 76 U/L (ref 35–144)
BUN: 10 mg/dL (ref 7–25)
CO2: 26 mmol/L (ref 20–32)
Calcium: 9.6 mg/dL (ref 8.6–10.3)
Chloride: 101 mmol/L (ref 98–110)
Creat: 1.03 mg/dL (ref 0.70–1.18)
GFR, Est African American: 83 mL/min/{1.73_m2} (ref >=60)
GFR, Est Non African American: 72 mL/min/{1.73_m2} (ref >=60)
Globulin: 2.2 g/dL (calc) (ref 1.9–3.7)
Glucose, Bld: 235 mg/dL — ABNORMAL HIGH (ref 65–99)
Potassium: 4.2 mmol/L (ref 3.5–5.3)
Sodium: 137 mmol/L (ref 135–146)
Total Bilirubin: 0.4 mg/dL (ref 0.2–1.2)
Total Protein: 6.3 g/dL (ref 6.1–8.1)

## 2020-12-16 LAB — FRUCTOSAMINE: Fructosamine: 290 umol/L — ABNORMAL HIGH (ref 205–285)

## 2021-01-08 ENCOUNTER — Other Ambulatory Visit: Payer: Self-pay | Admitting: Adult Health

## 2021-01-08 DIAGNOSIS — E1169 Type 2 diabetes mellitus with other specified complication: Secondary | ICD-10-CM

## 2021-01-08 MED ORDER — ATORVASTATIN CALCIUM 10 MG PO TABS
10.0000 mg | ORAL_TABLET | Freq: Every day | ORAL | 3 refills | Status: DC
Start: 2021-01-08 — End: 2021-03-05

## 2021-01-11 ENCOUNTER — Emergency Department (HOSPITAL_COMMUNITY): Payer: Medicare Other

## 2021-01-11 ENCOUNTER — Encounter (HOSPITAL_COMMUNITY): Payer: Self-pay

## 2021-01-11 ENCOUNTER — Observation Stay (HOSPITAL_COMMUNITY)
Admission: EM | Admit: 2021-01-11 | Discharge: 2021-01-12 | Disposition: A | Discharge status: Home / Self Care | Payer: Medicare Other | Attending: Internal Medicine | Admitting: Internal Medicine

## 2021-01-11 DIAGNOSIS — R4182 Altered mental status, unspecified: Secondary | ICD-10-CM | POA: Diagnosis present

## 2021-01-11 DIAGNOSIS — T68XXXA Hypothermia, initial encounter: Secondary | ICD-10-CM | POA: Diagnosis not present

## 2021-01-11 DIAGNOSIS — Z794 Long term (current) use of insulin: Secondary | ICD-10-CM | POA: Insufficient documentation

## 2021-01-11 DIAGNOSIS — G9340 Encephalopathy, unspecified: Secondary | ICD-10-CM | POA: Insufficient documentation

## 2021-01-11 DIAGNOSIS — I2510 Atherosclerotic heart disease of native coronary artery without angina pectoris: Secondary | ICD-10-CM

## 2021-01-11 DIAGNOSIS — R0602 Shortness of breath: Secondary | ICD-10-CM | POA: Diagnosis not present

## 2021-01-11 DIAGNOSIS — Z8546 Personal history of malignant neoplasm of prostate: Secondary | ICD-10-CM | POA: Diagnosis not present

## 2021-01-11 DIAGNOSIS — Z87891 Personal history of nicotine dependence: Secondary | ICD-10-CM | POA: Diagnosis not present

## 2021-01-11 DIAGNOSIS — E1142 Type 2 diabetes mellitus with diabetic polyneuropathy: Secondary | ICD-10-CM | POA: Diagnosis present

## 2021-01-11 DIAGNOSIS — Z79899 Other long term (current) drug therapy: Secondary | ICD-10-CM | POA: Diagnosis not present

## 2021-01-11 DIAGNOSIS — Z7984 Long term (current) use of oral hypoglycemic drugs: Secondary | ICD-10-CM | POA: Diagnosis not present

## 2021-01-11 DIAGNOSIS — E876 Hypokalemia: Secondary | ICD-10-CM | POA: Diagnosis not present

## 2021-01-11 DIAGNOSIS — G319 Degenerative disease of nervous system, unspecified: Secondary | ICD-10-CM | POA: Diagnosis not present

## 2021-01-11 DIAGNOSIS — G9341 Metabolic encephalopathy: Secondary | ICD-10-CM | POA: Diagnosis not present

## 2021-01-11 DIAGNOSIS — K760 Fatty (change of) liver, not elsewhere classified: Secondary | ICD-10-CM | POA: Diagnosis not present

## 2021-01-11 DIAGNOSIS — Z7982 Long term (current) use of aspirin: Secondary | ICD-10-CM | POA: Diagnosis not present

## 2021-01-11 DIAGNOSIS — I10 Essential (primary) hypertension: Secondary | ICD-10-CM | POA: Diagnosis not present

## 2021-01-11 DIAGNOSIS — E162 Hypoglycemia, unspecified: Secondary | ICD-10-CM | POA: Diagnosis not present

## 2021-01-11 DIAGNOSIS — I639 Cerebral infarction, unspecified: Secondary | ICD-10-CM | POA: Diagnosis not present

## 2021-01-11 DIAGNOSIS — R4020 Unspecified coma: Secondary | ICD-10-CM | POA: Diagnosis not present

## 2021-01-11 DIAGNOSIS — E161 Other hypoglycemia: Secondary | ICD-10-CM | POA: Diagnosis not present

## 2021-01-11 DIAGNOSIS — T383X5A Adverse effect of insulin and oral hypoglycemic [antidiabetic] drugs, initial encounter: Secondary | ICD-10-CM | POA: Insufficient documentation

## 2021-01-11 DIAGNOSIS — Z20822 Contact with and (suspected) exposure to covid-19: Secondary | ICD-10-CM | POA: Insufficient documentation

## 2021-01-11 DIAGNOSIS — E160 Drug-induced hypoglycemia without coma: Secondary | ICD-10-CM | POA: Diagnosis not present

## 2021-01-11 DIAGNOSIS — R404 Transient alteration of awareness: Secondary | ICD-10-CM | POA: Diagnosis not present

## 2021-01-11 DIAGNOSIS — E785 Hyperlipidemia, unspecified: Secondary | ICD-10-CM | POA: Diagnosis present

## 2021-01-11 DIAGNOSIS — E1169 Type 2 diabetes mellitus with other specified complication: Secondary | ICD-10-CM | POA: Diagnosis present

## 2021-01-11 DIAGNOSIS — R29818 Other symptoms and signs involving the nervous system: Secondary | ICD-10-CM | POA: Diagnosis not present

## 2021-01-11 DIAGNOSIS — Z743 Need for continuous supervision: Secondary | ICD-10-CM | POA: Diagnosis not present

## 2021-01-11 LAB — CBC
HCT: 37.2 % — ABNORMAL LOW (ref 39.0–52.0)
Hemoglobin: 11.9 g/dL — ABNORMAL LOW (ref 13.0–17.0)
MCH: 30.3 pg (ref 26.0–34.0)
MCHC: 32 g/dL (ref 30.0–36.0)
MCV: 94.7 fL (ref 80.0–100.0)
Platelets: 149 10*3/uL — ABNORMAL LOW (ref 150–400)
RBC: 3.93 MIL/uL — ABNORMAL LOW (ref 4.22–5.81)
RDW: 13.6 % (ref 11.5–15.5)
WBC: 5.5 10*3/uL (ref 4.0–10.5)
nRBC: 0 % (ref 0.0–0.2)

## 2021-01-11 LAB — URINALYSIS, ROUTINE W REFLEX MICROSCOPIC
Bilirubin Urine: NEGATIVE
Glucose, UA: 50 mg/dL — AB
Hgb urine dipstick: NEGATIVE
Ketones, ur: NEGATIVE mg/dL
Leukocytes,Ua: NEGATIVE
Nitrite: NEGATIVE
Protein, ur: NEGATIVE mg/dL
Specific Gravity, Urine: 1.031 — ABNORMAL HIGH (ref 1.005–1.030)
pH: 6 (ref 5.0–8.0)

## 2021-01-11 LAB — RAPID URINE DRUG SCREEN, HOSP PERFORMED
Amphetamines: NOT DETECTED
Barbiturates: NOT DETECTED
Benzodiazepines: NOT DETECTED
Cocaine: NOT DETECTED
Opiates: NOT DETECTED
Tetrahydrocannabinol: NOT DETECTED

## 2021-01-11 LAB — COMPREHENSIVE METABOLIC PANEL
ALT: 49 U/L — ABNORMAL HIGH (ref 0–44)
AST: 58 U/L — ABNORMAL HIGH (ref 15–41)
Albumin: 4.1 g/dL (ref 3.5–5.0)
Alkaline Phosphatase: 55 U/L (ref 38–126)
Anion gap: 9 (ref 5–15)
BUN: 14 mg/dL (ref 8–23)
CO2: 23 mmol/L (ref 22–32)
Calcium: 9.1 mg/dL (ref 8.9–10.3)
Chloride: 103 mmol/L (ref 98–111)
Creatinine, Ser: 0.9 mg/dL (ref 0.61–1.24)
GFR, Estimated: >60 mL/min (ref >60)
Glucose, Bld: 77 mg/dL (ref 70–99)
Potassium: 3.3 mmol/L — ABNORMAL LOW (ref 3.5–5.1)
Sodium: 135 mmol/L (ref 135–145)
Total Bilirubin: 0.8 mg/dL (ref 0.3–1.2)
Total Protein: 6.5 g/dL (ref 6.5–8.1)

## 2021-01-11 LAB — DIFFERENTIAL
Abs Immature Granulocytes: 0.02 10*3/uL (ref 0.00–0.07)
Basophils Absolute: 0 10*3/uL (ref 0.0–0.1)
Basophils Relative: 1 %
Eosinophils Absolute: 0.1 10*3/uL (ref 0.0–0.5)
Eosinophils Relative: 3 %
Immature Granulocytes: 0 %
Lymphocytes Relative: 20 %
Lymphs Abs: 1.1 10*3/uL (ref 0.7–4.0)
Monocytes Absolute: 0.7 10*3/uL (ref 0.1–1.0)
Monocytes Relative: 14 %
Neutro Abs: 3.4 10*3/uL (ref 1.7–7.7)
Neutrophils Relative %: 62 %

## 2021-01-11 LAB — RESP PANEL BY RT-PCR (FLU A&B, COVID) ARPGX2
Influenza A by PCR: NEGATIVE
Influenza B by PCR: NEGATIVE
SARS Coronavirus 2 by RT PCR: NEGATIVE

## 2021-01-11 LAB — CBG MONITORING, ED
Glucose-Capillary: 75 mg/dL (ref 70–99)
Glucose-Capillary: 45 mg/dL — ABNORMAL LOW (ref 70–99)
Glucose-Capillary: 135 mg/dL — ABNORMAL HIGH (ref 70–99)
Glucose-Capillary: 118 mg/dL — ABNORMAL HIGH (ref 70–99)

## 2021-01-11 LAB — ETHANOL: Alcohol, Ethyl (B): <10 mg/dL (ref <10)

## 2021-01-11 LAB — MAGNESIUM: Magnesium: 1.7 mg/dL (ref 1.7–2.4)

## 2021-01-11 LAB — AMMONIA: Ammonia: 33 umol/L (ref 9–35)

## 2021-01-11 LAB — PROTIME-INR
INR: 1 (ref 0.8–1.2)
Prothrombin Time: 13.5 seconds (ref 11.4–15.2)

## 2021-01-11 LAB — BLOOD GAS, VENOUS
Acid-base deficit: 0.7 mmol/L (ref 0.0–2.0)
Bicarbonate: 21 mmol/L (ref 20.0–28.0)
FIO2: 21
O2 Saturation: 23.4 %
Patient temperature: 34
pCO2, Ven: 56.2 mmHg (ref 44.0–60.0)
pH, Ven: 7.267 (ref 7.250–7.430)
pO2, Ven: <31 mmHg — CL (ref 32.0–45.0)

## 2021-01-11 LAB — APTT: aPTT: 35 seconds (ref 24–36)

## 2021-01-11 LAB — TSH: TSH: 0.684 u[IU]/mL (ref 0.350–4.500)

## 2021-01-11 LAB — TROPONIN I (HIGH SENSITIVITY): Troponin I (High Sensitivity): 4 ng/L (ref <18)

## 2021-01-11 LAB — LACTIC ACID, PLASMA
Lactic Acid, Venous: 3.1 mmol/L (ref 0.5–1.9)
Lactic Acid, Venous: 1.3 mmol/L (ref 0.5–1.9)

## 2021-01-11 MED ORDER — ACETAMINOPHEN 325 MG PO TABS
650.0000 mg | ORAL_TABLET | Freq: Four times a day (QID) | ORAL | Status: DC | PRN
  Administered 2021-01-12: 650 mg via ORAL
  Filled 2021-01-11: qty 2

## 2021-01-11 MED ORDER — DEXTROSE 10 % IV SOLN: INTRAVENOUS | Status: DC

## 2021-01-11 MED ORDER — VANCOMYCIN HCL IN DEXTROSE 1-5 GM/200ML-% IV SOLN
1000.0000 mg | Freq: Once | INTRAVENOUS | Status: AC
  Administered 2021-01-11: 1000 mg via INTRAVENOUS
  Filled 2021-01-11: qty 200

## 2021-01-11 MED ORDER — POTASSIUM CHLORIDE CRYS ER 20 MEQ PO TBCR
40.0000 meq | EXTENDED_RELEASE_TABLET | ORAL | Status: AC
  Administered 2021-01-11 – 2021-01-12 (×2): 40 meq via ORAL
  Filled 2021-01-11 (×2): qty 2

## 2021-01-11 MED ORDER — NALOXONE HCL 2 MG/2ML IJ SOSY
PREFILLED_SYRINGE | INTRAMUSCULAR | Status: AC
  Filled 2021-01-11: qty 2

## 2021-01-11 MED ORDER — ACETAMINOPHEN 650 MG RE SUPP: 650.0000 mg | Freq: Four times a day (QID) | RECTAL | Status: DC | PRN

## 2021-01-11 MED ORDER — GABAPENTIN 400 MG PO CAPS
800.0000 mg | ORAL_CAPSULE | Freq: Two times a day (BID) | ORAL | Status: DC
  Administered 2021-01-11 – 2021-01-12 (×2): 800 mg via ORAL
  Filled 2021-01-11 (×2): qty 2

## 2021-01-11 MED ORDER — METRONIDAZOLE 500 MG/100ML IV SOLN
500.0000 mg | Freq: Once | INTRAVENOUS | Status: AC
  Administered 2021-01-11: 500 mg via INTRAVENOUS
  Filled 2021-01-11: qty 100

## 2021-01-11 MED ORDER — DEXTROSE 50 % IV SOLN
INTRAVENOUS | Status: AC
  Filled 2021-01-11: qty 50

## 2021-01-11 MED ORDER — MONTELUKAST SODIUM 10 MG PO TABS: 10.0000 mg | ORAL_TABLET | Freq: Every day | ORAL | Status: DC

## 2021-01-11 MED ORDER — ASPIRIN EC 81 MG PO TBEC
81.0000 mg | DELAYED_RELEASE_TABLET | Freq: Every day | ORAL | Status: DC
  Administered 2021-01-12: 81 mg via ORAL
  Filled 2021-01-11: qty 1

## 2021-01-11 MED ORDER — POLYETHYLENE GLYCOL 3350 17 G PO PACK: 17.0000 g | PACK | Freq: Every day | ORAL | Status: DC | PRN

## 2021-01-11 MED ORDER — IOHEXOL 350 MG/ML SOLN
75.0000 mL | Freq: Once | INTRAVENOUS | Status: AC | PRN
  Administered 2021-01-11: 75 mL via INTRAVENOUS

## 2021-01-11 MED ORDER — ONDANSETRON HCL 4 MG PO TABS
4.0000 mg | ORAL_TABLET | Freq: Four times a day (QID) | ORAL | Status: DC | PRN
Start: 2021-01-11 — End: 2021-01-12

## 2021-01-11 MED ORDER — SODIUM CHLORIDE 0.9 % IV SOLN: 2.0000 g | Freq: Three times a day (TID) | INTRAVENOUS | Status: DC

## 2021-01-11 MED ORDER — SODIUM CHLORIDE 0.9 % IV SOLN
2.0000 g | Freq: Once | INTRAVENOUS | Status: AC
  Administered 2021-01-11: 2 g via INTRAVENOUS
  Filled 2021-01-11: qty 2

## 2021-01-11 MED ORDER — VANCOMYCIN HCL IN DEXTROSE 1-5 GM/200ML-% IV SOLN: 1000.0000 mg | Freq: Two times a day (BID) | INTRAVENOUS | Status: DC

## 2021-01-11 MED ORDER — BUPROPION HCL ER (XL) 150 MG PO TB24
300.0000 mg | ORAL_TABLET | Freq: Every day | ORAL | Status: DC
  Administered 2021-01-12: 300 mg via ORAL
  Filled 2021-01-11: qty 2

## 2021-01-11 MED ORDER — ATORVASTATIN CALCIUM 10 MG PO TABS
10.0000 mg | ORAL_TABLET | Freq: Every day | ORAL | Status: DC
  Administered 2021-01-11: 10 mg via ORAL
  Filled 2021-01-11: qty 1

## 2021-01-11 MED ORDER — ONDANSETRON HCL 4 MG/2ML IJ SOLN: 4.0000 mg | Freq: Four times a day (QID) | INTRAMUSCULAR | Status: DC | PRN

## 2021-01-11 MED ORDER — ENOXAPARIN SODIUM 60 MG/0.6ML IJ SOSY
50.0000 mg | PREFILLED_SYRINGE | INTRAMUSCULAR | Status: DC
  Administered 2021-01-11: 50 mg via SUBCUTANEOUS
  Filled 2021-01-11: qty 0.6

## 2021-01-11 MED ORDER — PANTOPRAZOLE SODIUM 40 MG PO TBEC
40.0000 mg | DELAYED_RELEASE_TABLET | Freq: Every day | ORAL | Status: DC
  Administered 2021-01-12: 40 mg via ORAL
  Filled 2021-01-11: qty 1

## 2021-01-11 MED ORDER — SODIUM CHLORIDE 0.9 % IV BOLUS (SEPSIS)
1000.0000 mL | Freq: Once | INTRAVENOUS | Status: AC
  Administered 2021-01-11: 1000 mL via INTRAVENOUS

## 2021-01-11 MED ORDER — TAMSULOSIN HCL 0.4 MG PO CAPS
0.4000 mg | ORAL_CAPSULE | Freq: Every day | ORAL | Status: DC
  Administered 2021-01-11: 0.4 mg via ORAL
  Filled 2021-01-11: qty 1

## 2021-01-11 MED ORDER — DULOXETINE HCL 60 MG PO CPEP
60.0000 mg | ORAL_CAPSULE | Freq: Every day | ORAL | Status: DC
  Administered 2021-01-12: 60 mg via ORAL
  Filled 2021-01-11: qty 1

## 2021-01-11 MED ORDER — METOPROLOL SUCCINATE ER 25 MG PO TB24
12.5000 mg | ORAL_TABLET | Freq: Every day | ORAL | Status: DC
  Administered 2021-01-12: 12.5 mg via ORAL
  Filled 2021-01-11: qty 1

## 2021-01-11 NOTE — Progress Notes (Signed)
Pharmacy Antibiotic Note  Michael Guzman is a 75 y.o. male admitted on 01/11/2021 with sepsis.  Pharmacy has been consulted for vancomycin and cefepime dosing.  Plan: Vancomycin 1000mg  IV every 12 hours.  Goal trough 15-20 mcg/mL. cefepime 2gm q8h  Height: 6' (182.9 cm) Weight: 104.2 kg (229 lb 11.2 oz) IBW/kg (Calculated) : 77.6  Temp (24hrs), Avg:96.2 F (35.7 C), Min:93.5 F (34.2 C), Max:97.6 F (36.4 C)  Recent Labs  Lab 01/11/21 1433 01/11/21 1541 01/11/21 1830  WBC 5.5  --   --   CREATININE 0.90  --   --   LATICACIDVEN  --  3.1* 1.3    Estimated Creatinine Clearance: 91.2 mL/min (by C-G formula based on SCr of 0.9 mg/dL).    No Known Allergies  Antimicrobials this admission: 4/30 cefepime >>  4/30 vancomycin >>  4/30 metronidazole x 1   Microbiology results: 4/30 Bcx: sent  4/30 Resp: negative   Thank you for allowing pharmacy to be a part of this patient's care.  Donna Christen Myrka Sylva 01/11/2021 7:43 PM

## 2021-01-11 NOTE — ED Notes (Signed)
Urine sample requested.

## 2021-01-11 NOTE — Consult Note (Signed)
[facility] Telemedicine Consult   Requesting Provider: Rosalyn Gess Consult Participants: Wife, bedside nurse, patient Location of the provider: [facility] Location of the patient: [facility]   This consult was provided via telemedicine with 2-way video and audio communication. The patient/family was informed that care would be provided in this way and agreed to receive care in this manner.    Chief Complaint: AMS  HPI: Patient was in his normal state when he went to lay down for a nap about 12:30pm. When his wife went to get him, he was difficult to arouse. She noticed he was diaphoretic.  She could get him to speak a little, but not open his eyes. By EMS, his glucose was in the low 70s and he was give a bag of D5, 588ml and his BG on arrival to the ED was 75.     LKW: 12:30 pm tpa given?: No, mild symptoms IR Thrombectomy? No, no LVO Modified Rankin Scale: 0-Completely asymptomatic and back to baseline post- stroke Time of teleneurologist evaluation: 14:59  Exam: Vitals:   01/11/21 1438 01/11/21 1440  BP: (!) 131/91 (!) 131/91  Pulse: 78 80  Resp: 16 12  SpO2: 99% 100%    General: In bed, NAD, lethargic but rousable.   1A: Level of Consciousness - 1 1B: Ask Month and Age - 1 1C: 'Blink Eyes' & 'Squeeze Hands' - 0 2: Test Horizontal Extraocular Movements - 0- initially did not look fully to either side, but with repeated prompting and the nurse calling him, he did end up looking fully bilaterally.  3: Test Visual Fields - 0 4: Test Facial Palsy - 0 5A: Test Left Arm Motor Drift - 1 5B: Test Right Arm Motor Drift - 1 6A: Test Left Leg Motor Drift - 0 6B: Test Right Leg Motor Drift - 0 7: Test Limb Ataxia - 0 8: Test Sensation - 0 9: Test Language/Aphasia- 0 10: Test Dysarthria - 0 11: Test Extinction/Inattention - 0 NIHSS score: 4  He has bilateral mild drift in his arms that is symmetrical.   Imaging Reviewed: CT head -  negative CTA head -negative  Labs reviewed in epic and pertinent values follow: HgB 11.9 WBC 5.5 Plt 149  AS 58, AlT 49   Assessment: 75 yo M with encephalopathy of unclear etiology.  Currently, he seems encephalopathic, but has no focal findings on exam to suggest acute ischemic stroke as etiology.  Given his mental status, there was concern for possible basilar disease but CTA without evidence of this.  Following a 500 mL D5 bolus, his blood sugar was 75, so I do wonder if the initial 71 that EMS reports was accurate.  That makes me wonder if hypoglycemia could have played a role, but this was not clear.   No evidence of seizure activity by history.   At this point, the differential for his encephalopathy is broad, but I think ischemia is less likely and therefore I would not recommend IV tPA at this time.  Recommendations:  1) CMP, UDS, ABG, ammonia, TSH, infectious screen 2) if no other cause for AMS is found, then would consider EEG and MRI 3) Please contact neurology if we can be of any further assistance.     This patient is receiving care for possible acute neurological changes. There was 45 minutes of care by this provider at the time of service, including time for direct evaluation via telemedicine, review of medical records, imaging studies and discussion  of findings with providers, the patient and/or family.  Roland Rack, MD [facility] [phone]  If 7pm- 7am, please page neurology on call as listed in [place].

## 2021-01-11 NOTE — ED Notes (Signed)
Pts Spouse Olin Hauser prefers the Home Phone Number be called in the Pts chart if family needs to be contacted in the evening or at night.

## 2021-01-11 NOTE — ED Notes (Signed)
pts rectal temp is 93.5. pt placed on bear hugger . MD notified

## 2021-01-11 NOTE — ED Triage Notes (Signed)
Pt presents to ED via [facility] for unresponsive. EMS called out for unresponsive, Narcan 2 mg IV, became more alert, nasal trumpet in place, 18 g in L AC. 97% on RA. CBG 71 given 1/2 D5 came up to 147 then decreased to 91 given rest bag of D5 (261ml). Pin point pupils. Snoring respirations noted.

## 2021-01-11 NOTE — ED Provider Notes (Addendum)
[facility] EMERGENCY DEPARTMENT Provider Note   CSN: [id number] Arrival date & time: 01/11/21  1425  An emergency department physician performed an initial assessment on this suspected stroke patient at 1440.  History Chief Complaint  Patient presents with  . Unresponsive    Michael Guzman is a 75 y.o. male.  Patient with CAD, diabetes, reflux presents with EMS for being unresponsive.  Vague history and report that patient was normal this morning and sometime earlier this afternoon went to sleep and they could not wake him up.  Patient had sonorous respirations for EMS, borderline glucose around 70 no improvement with D50.  No reported new medications and no witnessed seizures.  No history of seizures known.  Looking at patient's chart history of prostate cancer with radiation and fatty liver disease as well.  Family on route.        Past Medical History:  Diagnosis Date  . Arthritis   . CAD (coronary artery disease)    a.s/p DES to mid LAD and OM2 06/2012.  Marland Kitchen Chronic back pain   . Chronically dry eyes   . Diabetes mellitus without complication ([place])    TYPE 2   . DKA (diabetic ketoacidoses) 01/30/2017  . Elevated PSA    being monitored  . Fatty liver   . GERD (gastroesophageal reflux disease)   . Hx of radiation therapy    prostate , [facility]   . Hypertension   . Hypertriglyceridemia   . Intermediate coronary syndrome ([place]) 07/26/2012  . Prostate cancer [facility])    RADTIATION     Patient Active Problem List   Diagnosis Date Noted  . Primary osteoarthritis of hip 04/30/2019  . Former smoker 02/02/2019  . Fatty infiltration of liver 11/30/2018  . Hypertriglyceridemia   . Microalbuminuric diabetic nephropathy ([place]) 06/01/2018  . Aortic atherosclerosis ([place]) by Abd CT scan 2015 05/30/2018  . Type 2 diabetes mellitus with vascular disease ([place]) 01/31/2018  . Obesity (BMI 30.0-34.9) 11/03/2017  . Thyroiditis 05/12/2016  . Malignant neoplasm of  prostate ([place]) 05/05/2016  . Diabetic peripheral neuropathy ([place]) 03/06/2015  . Medication management 07/24/2014  . Vitamin D deficiency 03/06/2014  . Hyperlipidemia associated with type 2 diabetes mellitus ([place]) 06/28/2012  . ASCAD s/p PTCA (06/2012) 06/27/2012  . Hypertension (1991) 06/25/2012  . GERD 06/25/2012    Past Surgical History:  Procedure Laterality Date  . APPENDECTOMY    . CARDIAC SURGERY  ~2017   3 stent placed.  . COLONOSCOPY  02/12/2010   Buccini  . LEFT HEART CATHETERIZATION WITH CORONARY ANGIOGRAM N/A 06/27/2012   Procedure: LEFT HEART CATHETERIZATION WITH CORONARY ANGIOGRAM;  Surgeon: Candee Furbish, MD;  Location: [facility] CATH LAB;  Service: Cardiovascular;  Laterality: N/A;  . LEFT HEART CATHETERIZATION WITH CORONARY ANGIOGRAM N/A 07/25/2012   Procedure: LEFT HEART CATHETERIZATION WITH CORONARY ANGIOGRAM;  Surgeon: Sueanne Margarita, MD;  Location: [facility] CATH LAB;  Service: Cardiovascular;  Laterality: N/A;  . PROSTATE BIOPSY    . RADIOLOGY WITH ANESTHESIA N/A 12/03/2015   Procedure: MRI LUMBAR SPINE;  Surgeon: Medication Radiologist, MD;  Location: [facility];  Service: Radiology;  Laterality: N/A;  . TOTAL HIP ARTHROPLASTY Right 04/28/2019   Procedure: TOTAL HIP ARTHROPLASTY ANTERIOR APPROACH;  Surgeon: Dorna Leitz, MD;  Location: [facility] ORS;  Service: Orthopedics;  Laterality: Right;       Family History  Problem Relation Age of Onset  . Diabetes Sister     Social History   Tobacco Use  . Smoking status: Former  Smoker    Packs/day: 0.75    Years: 50.00    Pack years: 78.50    Start date: 35    Quit date: 07/27/2011    Years since quitting: 9.4  . Smokeless tobacco: Never Used  Substance Use Topics  . Alcohol use: Not Currently    Comment: qut 20 years ago per patient   . Drug use: No    Home Medications Prior to Admission medications   Medication Sig Start Date End Date Taking? Authorizing Provider  Ascorbic Acid (VITAMIN C) 100 MG tablet Take 100 mg by mouth  daily.    [provider]  aspirin EC 81 MG tablet Take 1 tablet (81 mg total) by mouth daily. 10/27/19   Jerline Pain, MD  atorvastatin (LIPITOR) 10 MG tablet Take 1 tablet (10 mg total) by mouth daily. 01/08/21 01/08/22  Liane Comber, NP  Blood Glucose Monitoring Suppl (ONE TOUCH ULTRA SYSTEM KIT) w/Device KIT Check blood sugar 1 time daily-DX-E11.22 04/09/16   [name], MD  buPROPion (WELLBUTRIN XL) 300 MG 24 hr tablet TAKE 1 TABLET BY MOUTH  DAILY FOR MOOD, FOCUS AND  CONCENTRATION 04/09/20   Liane Comber, NP  Cholecalciferol (VITAMIN D3) 125 MCG (5000 UT) CAPS Take 10,000 Units by mouth daily.     [provider]  docusate sodium (COLACE) 100 MG capsule Take 1 capsule (100 mg total) by mouth 2 (two) times daily. 04/28/19   Gary Fleet, PA-C  DULoxetine (CYMBALTA) 60 MG capsule Take  1 capsule  Daily  for Mood & Chronic Pain 12/12/20   [name], MD  furosemide (LASIX) 40 MG tablet Take 1 tablet 2 x /day as needed  for Fluid Retention 10/13/19   [name], MD  gabapentin (NEURONTIN) 800 MG tablet TAKE 1 TABLET BY MOUTH 3 TO 4 TIMES DAILY FOR PAIN, HOT FLASHES AND SWEATS 07/31/20   Liane Comber, NP  glucose blood (ONETOUCH ULTRA) test strip CHECK BLOOD SUGAR 3 TIMES  DAILY (Dx: e11.29) 10/26/20   [name], MD  insulin NPH-regular Human (HUMULIN 70/30) (70-30) 100 UNIT/ML injection INJECT 50 UNITS INTO SKIN  TWICE DAILY 09/25/20   Liane Comber, NP  Insulin Syringes, Disposable, U-100 1 ML MISC 50 Units by Does not apply route 2 (two) times daily. inject 50 units into skin 2 x /day 07/23/20   [name], MD  Lancets [facility] DELICA PLUS [id number]) [place] 3 TIMES  DAILY 08/16/19   Liane Comber, NP  Magnesium 250 MG TABS Take 250 mg by mouth daily.     [provider]  metFORMIN (GLUCOPHAGE-XR) 500 MG 24 hr tablet TAKE 2 TABLETS BY MOUTH  TWICE DAILY WITH MEALS FOR  DIABETES 07/31/20   Liane Comber, NP   metoprolol succinate (TOPROL-XL) 25 MG 24 hr tablet TAKE ONE-HALF TABLET BY  MOUTH DAILY WITH OR  IMMEDIATELY FOLLOWING A  MEAL. 06/18/20   Jerline Pain, MD  montelukast (SINGULAIR) 10 MG tablet TAKE 1 TABLET BY MOUTH  DAILY FOR ALLERGIES 09/21/20   Liane Comber, NP  Multiple Vitamin (MULTIVITAMIN WITH MINERALS) TABS tablet Take 1 tablet by mouth daily.    [provider]  nitroGLYCERIN (NITROSTAT) 0.4 MG SL tablet PLACE 1 TABLET UNDER THE TONGUE EVERY 5 MINUTES AS NEEDED FOR CHEST PAIN Patient taking differently: Place 0.4 mg under the tongue every 5 (five) minutes as needed for chest pain. PLACE 1 TABLET UNDER THE TONGUE EVERY 5 MINUTES AS NEEDED FOR CHEST PAIN 05/02/18  [name], MD  pantoprazole (PROTONIX) 40 MG tablet TAKE 1 TABLET BY MOUTH  DAILY TO PREVENT HEARTBURN  AND INDIGESTION 10/02/20   Liane Comber, NP  Polyethyl Glycol-Propyl Glycol (SYSTANE) 0.4-0.3 % SOLN Place 2 drops into both eyes daily as needed (for dry eyes).    [provider]  tamsulosin (FLOMAX) 0.4 MG CAPS capsule Take 0.4 mg by mouth at bedtime.    [provider]  Zinc 30 MG TABS Take 30 mg by mouth daily.    [provider]    Allergies    Patient has no known allergies.  Review of Systems   Review of Systems  Unable to perform ROS: Mental status change    Physical Exam Updated Vital Signs BP (!) 131/91   Pulse 80   Resp 12   Ht 6' (1.829 m)   Wt 104.2 kg   SpO2 100%   BMI 31.15 kg/m   Physical Exam Vitals and nursing note reviewed.  Constitutional:      Appearance: He is well-developed.  HENT:     Head: Normocephalic and atraumatic.     Nose: No congestion.  Eyes:     General:        Right eye: No discharge.        Left eye: No discharge.     Conjunctiva/sclera: Conjunctivae normal.  Neck:     Trachea: No tracheal deviation.  Cardiovascular:     Rate and Rhythm: Normal rate.  Pulmonary:     Effort: Pulmonary effort is normal.     Breath  sounds: Normal breath sounds.  Abdominal:     General: There is no distension.     Palpations: Abdomen is soft.     Tenderness: There is no abdominal tenderness. There is no guarding.  Musculoskeletal:     Cervical back: Neck supple.  Skin:    General: Skin is warm.     Findings: No rash.  Neurological:     Mental Status: He is alert.     GCS: GCS eye subscore is 2. GCS verbal subscore is 3. GCS motor subscore is 3.     Comments: General weakness, somnolent, flexes extremities to painful stimuli initially.  Pupils equal bilateral.  Difficult neuro exam as initially would not follow commands except for open eyes to loud verbal stimulus.  Reassessment patient moving all extremities general weakness, mumbled verbal response.  Psychiatric:     Comments: Somnolent     ED Results / Procedures / Treatments   Labs (all labs ordered are listed, but only abnormal results are displayed) Labs Reviewed  CBC - Abnormal; Notable for the following components:      Result Value   RBC 3.93 (*)    Hemoglobin 11.9 (*)    HCT 37.2 (*)    Platelets 149 (*)    All other components within normal limits  COMPREHENSIVE METABOLIC PANEL - Abnormal; Notable for the following components:   Potassium 3.3 (*)    AST 58 (*)    ALT 49 (*)    All other components within normal limits  RESP PANEL BY RT-PCR (FLU A&B, COVID) [id number]  CULTURE, BLOOD (ROUTINE X 2)  CULTURE, BLOOD (ROUTINE X 2)  ETHANOL  PROTIME-INR  APTT  DIFFERENTIAL  AMMONIA  RAPID URINE DRUG SCREEN, HOSP PERFORMED  URINALYSIS, ROUTINE W REFLEX MICROSCOPIC  BLOOD GAS, VENOUS  LACTIC ACID, PLASMA  TSH  CBG MONITORING, ED  I-STAT CHEM 8, ED    EKG EKG Interpretation  Date/Time:  Saturday January 11 2021 15:26:03 EDT Ventricular Rate:  77 PR Interval:  196 QRS Duration: 163 QT Interval:  453 QTC Calculation: 513 R Axis:   28 Text Interpretation: Sinus rhythm Right bundle branch block Confirmed by Elnora Morrison [id number]) on 01/11/2021  3:42:24 PM   Radiology CT Angio Head W or Wo Contrast  Result Date: 01/11/2021 CLINICAL DATA:  Unresponsive patient.  Negative acute head CT. EXAM: CT ANGIOGRAPHY HEAD AND NECK TECHNIQUE: Multidetector CT imaging of the head and neck was performed using the standard protocol during bolus administration of intravenous contrast. Multiplanar CT image reconstructions and MIPs were obtained to evaluate the vascular anatomy. Carotid stenosis measurements (when applicable) are obtained utilizing NASCET criteria, using the distal internal carotid diameter as the denominator. CONTRAST:  44mL OMNIPAQUE IOHEXOL 350 MG/ML SOLN COMPARISON:  Head CT same day FINDINGS: CTA NECK FINDINGS Aortic arch: Minimal aortic atherosclerotic calcification. Branching pattern is normal. Right carotid system: Common carotid artery widely patent to the bifurcation. Carotid bifurcation shows minimal calcified plaque but no stenosis. Cervical ICA is normal. Left carotid system: Common carotid artery widely patent to the bifurcation. Carotid bifurcation shows minimal calcified plaque but no stenosis. Cervical ICA is normal. Vertebral arteries: Both vertebral artery origins are patent. Both vertebral arteries appear normal through the cervical region to foramen magnum. Skeleton: Degenerative arthritis at the C1-2 articulation. Minimal mid cervical spondylosis. Other neck: No mass or lymphadenopathy. Upper chest: No active process. Review of the MIP images confirms the above findings CTA HEAD FINDINGS Anterior circulation: Both internal carotid arteries widely patent through the skull base and siphon regions. The anterior and middle cerebral vessels are normal without proximal stenosis, aneurysm or vascular malformation. No large or medium vessel occlusion. Posterior circulation: Both vertebral arteries widely patent to the basilar. No basilar stenosis. Posterior circulation branch vessels are normal. Venous sinuses: Patent and normal. Anatomic  variants: None significant. Review of the MIP images confirms the above findings IMPRESSION: Negative CT angiography of the neck vessels and intracranial vessels. No large or medium vessel occlusion or correctable proximal stenosis. No aneurysm or vascular malformation. Electronically Signed   By: Nelson Chimes M.D.   On: 01/11/2021 15:31   CT Angio Neck W and/or Wo Contrast  Result Date: 01/11/2021 CLINICAL DATA:  Unresponsive patient.  Negative acute head CT. EXAM: CT ANGIOGRAPHY HEAD AND NECK TECHNIQUE: Multidetector CT imaging of the head and neck was performed using the standard protocol during bolus administration of intravenous contrast. Multiplanar CT image reconstructions and MIPs were obtained to evaluate the vascular anatomy. Carotid stenosis measurements (when applicable) are obtained utilizing NASCET criteria, using the distal internal carotid diameter as the denominator. CONTRAST:  85mL OMNIPAQUE IOHEXOL 350 MG/ML SOLN COMPARISON:  Head CT same day FINDINGS: CTA NECK FINDINGS Aortic arch: Minimal aortic atherosclerotic calcification. Branching pattern is normal. Right carotid system: Common carotid artery widely patent to the bifurcation. Carotid bifurcation shows minimal calcified plaque but no stenosis. Cervical ICA is normal. Left carotid system: Common carotid artery widely patent to the bifurcation. Carotid bifurcation shows minimal calcified plaque but no stenosis. Cervical ICA is normal. Vertebral arteries: Both vertebral artery origins are patent. Both vertebral arteries appear normal through the cervical region to foramen magnum. Skeleton: Degenerative arthritis at the C1-2 articulation. Minimal mid cervical spondylosis. Other neck: No mass or lymphadenopathy. Upper chest: No active process. Review of the MIP images confirms the above findings CTA HEAD FINDINGS Anterior circulation: Both internal carotid arteries widely patent through the skull base  and siphon regions. The anterior and  middle cerebral vessels are normal without proximal stenosis, aneurysm or vascular malformation. No large or medium vessel occlusion. Posterior circulation: Both vertebral arteries widely patent to the basilar. No basilar stenosis. Posterior circulation branch vessels are normal. Venous sinuses: Patent and normal. Anatomic variants: None significant. Review of the MIP images confirms the above findings IMPRESSION: Negative CT angiography of the neck vessels and intracranial vessels. No large or medium vessel occlusion or correctable proximal stenosis. No aneurysm or vascular malformation. Electronically Signed   By: Nelson Chimes M.D.   On: 01/11/2021 15:31   CT HEAD CODE STROKE WO CONTRAST  Result Date: 01/11/2021 CLINICAL DATA:  Code stroke. Patient unresponsive. Acute stroke suspected. EXAM: CT HEAD WITHOUT CONTRAST TECHNIQUE: Contiguous axial images were obtained from the base of the skull through the vertex without intravenous contrast. COMPARISON:  01/31/2018 FINDINGS: Brain: Generalized atrophy. No sign of old or acute focal infarction, mass lesion, hemorrhage, hydrocephalus or extra-axial collection. Vascular: No abnormal vascular finding. Skull: Normal Sinuses/Orbits: Clear/normal Other: None ASPECTS ([place] Stroke Program Early CT Score) - Ganglionic level infarction (caudate, lentiform nuclei, internal capsule, insula, M1-M3 cortex): 7 - Supraganglionic infarction (M4-M6 cortex): 3 Total score (0-10 with 10 being normal): 10 IMPRESSION: 1. Generalized atrophy.  No focal or acute finding. 2. ASPECTS is 10. 3. These results were called by telephone at the time of interpretation on 01/11/2021 at 3:04 pm to provider [facility] , who verbally acknowledged these results. Electronically Signed   By: Nelson Chimes M.D.   On: 01/11/2021 15:05    Procedures .Critical Care Performed by: Elnora Morrison, MD Authorized by: Elnora Morrison, MD   Critical care provider statement:    Critical care time  (minutes):  40   Critical care start time:  01/11/2021 2:50 PM   Critical care end time:  01/11/2021 3:30 PM   Critical care time was exclusive of:  Separately billable procedures and treating other patients and teaching time   Critical care was necessary to treat or prevent imminent or life-threatening deterioration of the following conditions:  CNS failure or compromise   Critical care was time spent personally by me on the following activities:  Discussions with consultants, evaluation of patient's response to treatment, examination of patient, ordering and performing treatments and interventions, ordering and review of laboratory studies, ordering and review of radiographic studies, pulse oximetry, re-evaluation of patient's condition, obtaining history from patient or surrogate and review of old charts     Medications Ordered in ED Medications  naloxone (NARCAN) 2 MG/2ML injection (  Given 01/11/21 1435)  dextrose 50 % solution (  Given 01/11/21 1440)  iohexol (OMNIPAQUE) 350 MG/ML injection 75 mL (75 mLs Intravenous Contrast Given 01/11/21 1520)    ED Course  I have reviewed the triage vital signs and the nursing notes.  Pertinent labs & imaging results that were available during my care of the patient were reviewed by me and considered in my medical decision making (see chart for details).    MDM Rules/Calculators/A&P                          Patient presents with EMS for somnolence with broad differential including metabolic, stroke, CNS bleed, tox, medication side effect, metabolic acidosis, infectious, other.  Patient vital signs mild elevated blood pressure, temperature pending.  No known seizure history however patient may be postictal.  Possible normal at 1 PM, code stroke called for further  details from neurology and recommendations.  Reassessments patient gradually improving definitely not at baseline.  Neurology evaluating the patient on telemetry neurology.  Family arrived to  provide a few more details.  Blood work reviewed hemoglobin 1.9, normal white blood cell count, mild low potassium 3.3.  Mild liver function around 50.  Ammonia pending.  Urinalysis pending.  CT scan results reviewed with radiology showing significant atrophy no CNS bleed.  Patient care signed out to reassess and follow-up neurology recommendations and plan for admission.  Hypothermia on rectal temp, bear hugger. TSH and cultures added. EKG mild non specific ST findings. Sinus.  Final Clinical Impression(s) / ED Diagnoses Final diagnoses:  Acute encephalopathy  Hypokalemia    Rx / DC Orders ED Discharge Orders    None       Elnora Morrison, MD 01/11/21 7001    Elnora Morrison, MD 01/11/21 1546

## 2021-01-11 NOTE — H&P (Signed)
History and Physical    DONATO STUDLEY AXK:[id number] DOB: 1946/02/25 DOA: 01/11/2021  PCP: [name], MD   Patient coming from: Home  I have personally briefly reviewed patient's old medical records in [place]  Chief Complaint: AMS  HPI: CAULIN BEGLEY is a 75 y.o. male with medical history significant for hypertension, coronary artery disease, diabetes mellitus, prostate cancer. Patient was brought to the ED via EMS reports of unresponsiveness.  The time of my evaluation, patient is awake and alert and able to give most of the history, spouse is present at bedside. Patient was last seen normal by his wife this morning, before she left the house to go buy lunch and came back at about noon and patient was lying in the bed, responding "hmmm" only when his name was called otherwise not responding.  No prior episodes. Patient reports he did not check his blood sugar which he usually does before he gave himself his insulin this morning.  He gave himself 100 units because this felt his blood sugar would be high.  But normally he takes 50 twice a day.  He ate breakfast.  Reports good oral intake.  No vomiting, no loose stools.  Last time he checked his blood sugar was yesterday morning, and it was in the 90s.  Patient reports about once a month his blood sugar might be as low as 75, unusually he is unaware, but about 8 months ago when his blood sugar was low, he felt abnormal.  Reports some headaches, but he has otherwise been in his normal state of health with chronic left shoulder pain.  No fevers.  No vomiting no loose stools , no urinary symptoms.  No cough or difficulty breathing.  No chest pain.  On EMS arrival blood sugar was in the 70s, (per triage notes patient responded to 72m Narcan given by EMS- I confirmed this was not accurate), 1/2 D50 given with improvement in blood sugars to 147.  ED Course: Unresponsive in the ED.  Hypothermic temperature 93.5, heart rate 70s  to 80s.  Blood pressure systolic 1786 1754  Blood glucose 77. Alcohol level less than 10.  Potassium 3.3.  Venous blood gas shows pH of 7.2, PCO2 of 56.  Lactic acidosis of 3.1.  Head CT, CTA head and neck done without acute abnormality.  Telemetry neurology was consulted, felt ischemia less likely, differentials to include encephalopathy, EEG and MRI if no other cause found. 2 mg Narcan given by EMS and again in the ED was without response.  Broad-spectrum antibiotics IV Vanco cefepime and metronidazole started.  Hospitalist to admit.  Review of Systems: As per HPI all other systems reviewed and negative.  Past Medical History:  Diagnosis Date  . Arthritis   . CAD (coronary artery disease)    a.s/p DES to mid LAD and OM2 06/2012.  .Marland KitchenChronic back pain   . Chronically dry eyes   . Diabetes mellitus without complication ([place]    TYPE 2   . DKA (diabetic ketoacidoses) 01/30/2017  . Elevated PSA    being monitored  . Fatty liver   . GERD (gastroesophageal reflux disease)   . Hx of radiation therapy    prostate , [facility]   . Hypertension   . Hypertriglyceridemia   . Intermediate coronary syndrome ([place] 07/26/2012  . Prostate cancer ([place]    RADTIATION     Past Surgical History:  Procedure Laterality Date  . APPENDECTOMY    . CARDIAC  SURGERY  ~2017   3 stent placed.  . COLONOSCOPY  02/12/2010   Buccini  . LEFT HEART CATHETERIZATION WITH CORONARY ANGIOGRAM N/A 06/27/2012   Procedure: LEFT HEART CATHETERIZATION WITH CORONARY ANGIOGRAM;  Surgeon: Candee Furbish, MD;  Location: [facility] CATH LAB;  Service: Cardiovascular;  Laterality: N/A;  . LEFT HEART CATHETERIZATION WITH CORONARY ANGIOGRAM N/A 07/25/2012   Procedure: LEFT HEART CATHETERIZATION WITH CORONARY ANGIOGRAM;  Surgeon: Sueanne Margarita, MD;  Location: [facility] CATH LAB;  Service: Cardiovascular;  Laterality: N/A;  . PROSTATE BIOPSY    . RADIOLOGY WITH ANESTHESIA N/A 12/03/2015   Procedure: MRI LUMBAR SPINE;  Surgeon:  Medication Radiologist, MD;  Location: [place];  Service: Radiology;  Laterality: N/A;  . TOTAL HIP ARTHROPLASTY Right 04/28/2019   Procedure: TOTAL HIP ARTHROPLASTY ANTERIOR APPROACH;  Surgeon: Dorna Leitz, MD;  Location: [facility];  Service: Orthopedics;  Laterality: Right;     reports that he quit smoking about 9 years ago. He started smoking about 62 years ago. He has a 37.50 pack-year smoking history. He has never used smokeless tobacco. He reports previous alcohol use. He reports that he does not use drugs.  No Known Allergies  Family History  Problem Relation Age of Onset  . Diabetes Sister     Prior to Admission medications   Medication Sig Start Date End Date Taking? Authorizing Provider  Ascorbic Acid (VITAMIN C) 100 MG tablet Take 100 mg by mouth daily.    [provider]  aspirin EC 81 MG tablet Take 1 tablet (81 mg total) by mouth daily. 10/27/19   Jerline Pain, MD  atorvastatin (LIPITOR) 10 MG tablet Take 1 tablet (10 mg total) by mouth daily. 01/08/21 01/08/22  Liane Comber, NP  Blood Glucose Monitoring Suppl (ONE TOUCH ULTRA SYSTEM KIT) w/Device KIT Check blood sugar 1 time daily-DX-E11.22 04/09/16   [name], MD  buPROPion (WELLBUTRIN XL) 300 MG 24 hr tablet TAKE 1 TABLET BY MOUTH  DAILY FOR MOOD, FOCUS AND  CONCENTRATION 04/09/20   Liane Comber, NP  Cholecalciferol (VITAMIN D3) 125 MCG (5000 UT) CAPS Take 10,000 Units by mouth daily.     [provider]  docusate sodium (COLACE) 100 MG capsule Take 1 capsule (100 mg total) by mouth 2 (two) times daily. 04/28/19   Gary Fleet, PA-C  DULoxetine (CYMBALTA) 60 MG capsule Take  1 capsule  Daily  for Mood & Chronic Pain 12/12/20   [name], MD  furosemide (LASIX) 40 MG tablet Take 1 tablet 2 x /day as needed  for Fluid Retention 10/13/19   [name], MD  gabapentin (NEURONTIN) 800 MG tablet TAKE 1 TABLET BY MOUTH 3 TO 4 TIMES DAILY FOR PAIN, HOT FLASHES AND SWEATS 07/31/20   Liane Comber,  NP  glucose blood (ONETOUCH ULTRA) test strip CHECK BLOOD SUGAR 3 TIMES  DAILY (Dx: e11.29) 10/26/20   [name], MD  insulin NPH-regular Human (HUMULIN 70/30) (70-30) 100 UNIT/ML injection INJECT 50 UNITS INTO SKIN  TWICE DAILY 09/25/20   Liane Comber, NP  Insulin Syringes, Disposable, U-100 1 ML MISC 50 Units by Does not apply route 2 (two) times daily. inject 50 units into skin 2 x /day 07/23/20   [name], MD  Lancets [facility] DELICA PLUS [id number]) [place] 3 TIMES  DAILY 08/16/19   Liane Comber, NP  Magnesium 250 MG TABS Take 250 mg by mouth daily.     [provider]  metFORMIN (GLUCOPHAGE-XR) 500 MG 24 hr tablet TAKE 2  TABLETS BY MOUTH  TWICE DAILY WITH MEALS FOR  DIABETES 07/31/20   Liane Comber, NP  metoprolol succinate (TOPROL-XL) 25 MG 24 hr tablet TAKE ONE-HALF TABLET BY  MOUTH DAILY WITH OR  IMMEDIATELY FOLLOWING A  MEAL. 06/18/20   Jerline Pain, MD  montelukast (SINGULAIR) 10 MG tablet TAKE 1 TABLET BY MOUTH  DAILY FOR ALLERGIES 09/21/20   Liane Comber, NP  Multiple Vitamin (MULTIVITAMIN WITH MINERALS) TABS tablet Take 1 tablet by mouth daily.    [provider]  nitroGLYCERIN (NITROSTAT) 0.4 MG SL tablet PLACE 1 TABLET UNDER THE TONGUE EVERY 5 MINUTES AS NEEDED FOR CHEST PAIN Patient taking differently: Place 0.4 mg under the tongue every 5 (five) minutes as needed for chest pain. PLACE 1 TABLET UNDER THE TONGUE EVERY 5 MINUTES AS NEEDED FOR CHEST PAIN 05/02/18   [name], MD  pantoprazole (PROTONIX) 40 MG tablet TAKE 1 TABLET BY MOUTH  DAILY TO PREVENT HEARTBURN  AND INDIGESTION 10/02/20   Liane Comber, NP  Polyethyl Glycol-Propyl Glycol (SYSTANE) 0.4-0.3 % SOLN Place 2 drops into both eyes daily as needed (for dry eyes).    [provider]  tamsulosin (FLOMAX) 0.4 MG CAPS capsule Take 0.4 mg by mouth at bedtime.    [provider]  Zinc 30 MG TABS Take 30 mg by mouth daily.    [provider]     Physical Exam: Vitals:   01/11/21 1551 01/11/21 1600 01/11/21 1609 01/11/21 1630  BP:  103/63  124/74  Pulse:  78  79  Resp:  13  13  Temp: (!) 93.5 F (34.2 C)     TempSrc: Rectal     SpO2:  100% 100% 100%  Weight:      Height:        Constitutional: NAD, calm, comfortable Vitals:   01/11/21 1551 01/11/21 1600 01/11/21 1609 01/11/21 1630  BP:  103/63  124/74  Pulse:  78  79  Resp:  13  13  Temp: (!) 93.5 F (34.2 C)     TempSrc: Rectal     SpO2:  100% 100% 100%  Weight:      Height:       Eyes: PERRL, lids and conjunctivae normal ENMT: Mucous membranes are moist. Neck: normal, supple, no masses, no thyromegaly Respiratory: clear to auscultation bilaterally, no wheezing, no crackles. Normal respiratory effort. No accessory muscle use.  Cardiovascular: Regular rate and rhythm, no murmurs / rubs / gallops. No extremity edema. 2+ pedal pulses.  Abdomen: no tenderness, no masses palpated. No hepatosplenomegaly. Bowel sounds positive.  Musculoskeletal: no clubbing / cyanosis. No joint deformity upper and lower extremities. Good ROM, no contractures. Normal muscle tone.  Skin: no rashes, lesions, ulcers. No induration Neurologic: Neurological:     Mental Status: he is alert.     GCS: GCS eye subscore is 4. GCS verbal subscore is 5. GCS motor subscore is 6.     Comments: Mental Status:  Alert, oriented, thought content appropriate, able to give a coherent history. Speech fluent without evidence of aphasia. Able to follow 2 step commands without difficulty.  Cranial Nerves:  II:  Peripheral visual fields grossly normal, pupils equal, round, reactive to light III,IV, VI: ptosis not present, extra-ocular motions intact bilaterally  V,VII: smile symmetric, eyebrows raise symmetric, facial light touch sensation equal VIII: hearing grossly normal to voice  XI: bilateral shoulder shrug symmetric and strong XII: midline tongue extension without fassiculations Motor:  Normal  tone. Equal 4+/5 strength in  all extremities. Sensory: Sensation intact to light touch in all extremities.  CV: distal pulses palpable throughout    Psychiatric: Normal judgment and insight. Alert and oriented x 3. Normal mood.   Labs on Admission: I have personally reviewed following labs and imaging studies  CBC: Recent Labs  Lab 01/11/21 1433  WBC 5.5  NEUTROABS 3.4  HGB 11.9*  HCT 37.2*  MCV 94.7  PLT 242*   Basic Metabolic Panel: Recent Labs  Lab 01/11/21 1433  NA 135  K 3.3*  CL 103  CO2 23  GLUCOSE 77  BUN 14  CREATININE 0.90  CALCIUM 9.1   Liver Function Tests: Recent Labs  Lab 01/11/21 1433  AST 58*  ALT 49*  ALKPHOS 55  BILITOT 0.8  PROT 6.5  ALBUMIN 4.1   No results for input(s): LIPASE, AMYLASE in the last 168 hours. Recent Labs  Lab 01/11/21 1455  AMMONIA 33   Coagulation Profile: Recent Labs  Lab 01/11/21 1433  INR 1.0   CBG: Recent Labs  Lab 01/11/21 1431  GLUCAP 75   Thyroid Function Tests: Recent Labs    01/11/21 1433  TSH 0.684    Radiological Exams on Admission: CT Angio Head W or Wo Contrast  Result Date: 01/11/2021 CLINICAL DATA:  Unresponsive patient.  Negative acute head CT. EXAM: CT ANGIOGRAPHY HEAD AND NECK TECHNIQUE: Multidetector CT imaging of the head and neck was performed using the standard protocol during bolus administration of intravenous contrast. Multiplanar CT image reconstructions and MIPs were obtained to evaluate the vascular anatomy. Carotid stenosis measurements (when applicable) are obtained utilizing NASCET criteria, using the distal internal carotid diameter as the denominator. CONTRAST:  52m OMNIPAQUE IOHEXOL 350 MG/ML SOLN COMPARISON:  Head CT same day FINDINGS: CTA NECK FINDINGS Aortic arch: Minimal aortic atherosclerotic calcification. Branching pattern is normal. Right carotid system: Common carotid artery widely patent to the bifurcation. Carotid bifurcation shows minimal calcified plaque but no  stenosis. Cervical ICA is normal. Left carotid system: Common carotid artery widely patent to the bifurcation. Carotid bifurcation shows minimal calcified plaque but no stenosis. Cervical ICA is normal. Vertebral arteries: Both vertebral artery origins are patent. Both vertebral arteries appear normal through the cervical region to foramen magnum. Skeleton: Degenerative arthritis at the C1-2 articulation. Minimal mid cervical spondylosis. Other neck: No mass or lymphadenopathy. Upper chest: No active process. Review of the MIP images confirms the above findings CTA HEAD FINDINGS Anterior circulation: Both internal carotid arteries widely patent through the skull base and siphon regions. The anterior and middle cerebral vessels are normal without proximal stenosis, aneurysm or vascular malformation. No large or medium vessel occlusion. Posterior circulation: Both vertebral arteries widely patent to the basilar. No basilar stenosis. Posterior circulation branch vessels are normal. Venous sinuses: Patent and normal. Anatomic variants: None significant. Review of the MIP images confirms the above findings IMPRESSION: Negative CT angiography of the neck vessels and intracranial vessels. No large or medium vessel occlusion or correctable proximal stenosis. No aneurysm or vascular malformation. Electronically Signed   By: MNelson ChimesM.D.   On: 01/11/2021 15:31   CT Angio Neck W and/or Wo Contrast  Result Date: 01/11/2021 CLINICAL DATA:  Unresponsive patient.  Negative acute head CT. EXAM: CT ANGIOGRAPHY HEAD AND NECK TECHNIQUE: Multidetector CT imaging of the head and neck was performed using the standard protocol during bolus administration of intravenous contrast. Multiplanar CT image reconstructions and MIPs were obtained to evaluate the vascular anatomy. Carotid stenosis measurements (when applicable) are obtained utilizing  NASCET criteria, using the distal internal carotid diameter as the denominator. CONTRAST:   2m OMNIPAQUE IOHEXOL 350 MG/ML SOLN COMPARISON:  Head CT same day FINDINGS: CTA NECK FINDINGS Aortic arch: Minimal aortic atherosclerotic calcification. Branching pattern is normal. Right carotid system: Common carotid artery widely patent to the bifurcation. Carotid bifurcation shows minimal calcified plaque but no stenosis. Cervical ICA is normal. Left carotid system: Common carotid artery widely patent to the bifurcation. Carotid bifurcation shows minimal calcified plaque but no stenosis. Cervical ICA is normal. Vertebral arteries: Both vertebral artery origins are patent. Both vertebral arteries appear normal through the cervical region to foramen magnum. Skeleton: Degenerative arthritis at the C1-2 articulation. Minimal mid cervical spondylosis. Other neck: No mass or lymphadenopathy. Upper chest: No active process. Review of the MIP images confirms the above findings CTA HEAD FINDINGS Anterior circulation: Both internal carotid arteries widely patent through the skull base and siphon regions. The anterior and middle cerebral vessels are normal without proximal stenosis, aneurysm or vascular malformation. No large or medium vessel occlusion. Posterior circulation: Both vertebral arteries widely patent to the basilar. No basilar stenosis. Posterior circulation branch vessels are normal. Venous sinuses: Patent and normal. Anatomic variants: None significant. Review of the MIP images confirms the above findings IMPRESSION: Negative CT angiography of the neck vessels and intracranial vessels. No large or medium vessel occlusion or correctable proximal stenosis. No aneurysm or vascular malformation. Electronically Signed   By: MNelson ChimesM.D.   On: 01/11/2021 15:31   DG Chest Portable 1 View  Result Date: 01/11/2021 CLINICAL DATA:  Shortness of breath. EXAM: PORTABLE CHEST 1 VIEW COMPARISON:  November 21, 2018 FINDINGS: The heart size and mediastinal contours are within normal limits. Both lungs are clear. The  visualized skeletal structures are unremarkable. Aortic calcifications are noted. The lung volumes are low. IMPRESSION: No active disease. Electronically Signed   By: CConstance HolsterM.D.   On: 01/11/2021 16:24   CT HEAD CODE STROKE WO CONTRAST  Result Date: 01/11/2021 CLINICAL DATA:  Code stroke. Patient unresponsive. Acute stroke suspected. EXAM: CT HEAD WITHOUT CONTRAST TECHNIQUE: Contiguous axial images were obtained from the base of the skull through the vertex without intravenous contrast. COMPARISON:  01/31/2018 FINDINGS: Brain: Generalized atrophy. No sign of old or acute focal infarction, mass lesion, hemorrhage, hydrocephalus or extra-axial collection. Vascular: No abnormal vascular finding. Skull: Normal Sinuses/Orbits: Clear/normal Other: None ASPECTS (AIndioStroke Program Early CT Score) - Ganglionic level infarction (caudate, lentiform nuclei, internal capsule, insula, M1-M3 cortex): 7 - Supraganglionic infarction (M4-M6 cortex): 3 Total score (0-10 with 10 being normal): 10 IMPRESSION: 1. Generalized atrophy.  No focal or acute finding. 2. ASPECTS is 10. 3. These results were called by telephone at the time of interpretation on 01/11/2021 at 3:04 pm to provider [facility], who verbally acknowledged these results. Electronically Signed   By: MNelson ChimesM.D.   On: 01/11/2021 15:05    EKG: Independently reviewed.  Sinus rhythm rate 77, old right bundle branch block.  QTc 513.  Flipped T waves in V3 only, no significant change from prior.  Assessment/Plan Principal Problem:   AMS (altered mental status) Active Problems:   Hypoglycemia due to insulin   Hypertension (1991)   ASCAD s/p PTCA (06/2012)   Hyperlipidemia associated with type 2 diabetes mellitus ([place]   Diabetic peripheral neuropathy ([place]   Acute metabolic encephalopathy secondary to hypoglycemia-blood glucose down to 45 in ED.  Likely from insulin, which he took 100 units of this morning.  Head CT, CTA head and  neck without acute abnormality.  He has no focal deficits.  Mental status is improving, but his hypoglycemia is recurrent 2/2 70/30. -  Telemetry neurology was consulted, felt ischemia less likely, differentials to include encephalopathy, EEG and MRI if no other cause found -Unremarkable UDS and blood alcohol level. - D50 given in the ED, continue with D10 50cc/hr -Monitor glucose q2 hr x 10  Hypothermia, lactic acidosis-temperature 93.5.  LA- 3.1 >> 1.3 - after 1 l bolus given. Doubt infectious etiology at this time.  Likely secondary to hypoglycemia.  UA portable chest x-ray without acute abnormality, no GI symptoms neck is supple. -Hold off on further antibiotics at this time -F/u blood cultures -Bair huggers  Diabetes mellitus presenting with hypoglycemia.  A1c 6.7.  Home medications NPH 70-30 50 units twice daily and metformin. -Diabetes coordinator consult for education -Hold home medications for now  Hypokalemia- 3.3. - Check mag - Replete  Prolonged Qtc- 513.  He is on antidepressants. -Check mag, replete K.  Peripheral neuropathy -Resume gabapentin  HTN -Resume metoprolol, tamsulosin  Depression -Resume Cymbalta, Wellbutrin  CAD-no chest pain, EKG without significant change.  History of DES 2013. -Resume aspirin, statins  Prostate cancer status postradiation therapy.  DVT prophylaxis: Lovenox Code Status: Full code Family Communication: Spouse at bedside Disposition Plan:  ~ 1 - 2 days Consults called: None Admission status: InPatient, stepdown. I certify that at the point of admission it is my clinical judgment that the patient will require inpatient hospital care spanning beyond 2 midnights from the point of admission due to high intensity of service, high risk for further deterioration and high frequency of surveillance required.    Bethena Roys MD [facility]  01/11/2021, 8:43 PM

## 2021-01-11 NOTE — ED Provider Notes (Signed)
Blood pressure (!) 131/91, pulse 80, resp. rate 12, height 6' (1.829 m), weight 104.2 kg, SpO2 100 %.  Assuming care from Dr. Reather Converse.  In short, Michael Guzman is a 75 y.o. male with a chief complaint of Unresponsive .  Refer to the original H&P for additional details.  The current plan of care is to f/u on labs and reassess.   EKG Interpretation  Date/Time:  Saturday January 11 2021 15:26:03 EDT Ventricular Rate:  77 PR Interval:  196 QRS Duration: 163 QT Interval:  453 QTC Calculation: 513 R Axis:   28 Text Interpretation: Sinus rhythm Right bundle branch block Confirmed by Elnora Morrison [phone]) on 01/11/2021 3:42:24 PM      I went to independently evaluate the patient after signout.  He opens eyes to voice and participates slowly with exam.  He denies any pain but is not able to give a full and complete history.  In speaking with Dr. Leonel Ramsay after his evaluation he has not a tPA candidate.  His presentation is more encephalopathy.  Consider other medical etiologies for this but if no other clear cause for the patient's symptoms is found could consider MRI/EEG but those would not be of high priority at this time.   04:23 PM  Patient's lactic acid elevated but no hypotension. Patient's mental status unchanged. Awakens to voice and gentle touch. Able to tell me his name and follow commands slowly. Repeat BP and no hypotension or tachycardia. With hypothermia here will start abx but other SIRS vitals not present and so remains a mixed picture. Certainly other etiologist could cause a rise in serum lactate other than sepsis.   CXR with no acute findings.   04:55 PM  Discussed patient's case with [facility], Dr. Denton Brick to request admission. Patient and family (if present) updated with plan. Care transferred to [facility] service.  I reviewed all nursing notes, vitals, pertinent old records, EKGs, labs, imaging (as available).     Margette Fast, MD 01/11/21 [place]

## 2021-01-11 NOTE — ED Notes (Signed)
Unable to obtain temp at this time. Pt unresponsive.

## 2021-01-11 NOTE — ED Notes (Signed)
Pt unresponsive and unable to sign MSE.

## 2021-01-11 NOTE — ED Notes (Signed)
Pt continues to have snoring respirations but will arouse with sternal stimuli. Pt placed on 2L of O2 for comfort.

## 2021-01-11 NOTE — ED Notes (Signed)
Pt to CT

## 2021-01-11 NOTE — ED Notes (Signed)
Pt CBG 45. AMP of D50 given. MD made aware. New orders to place pt on D10drip given.

## 2021-01-12 ENCOUNTER — Other Ambulatory Visit: Payer: Self-pay

## 2021-01-12 DIAGNOSIS — R4182 Altered mental status, unspecified: Secondary | ICD-10-CM

## 2021-01-12 LAB — CBC
HCT: 36.8 % — ABNORMAL LOW (ref 39.0–52.0)
Hemoglobin: 12.1 g/dL — ABNORMAL LOW (ref 13.0–17.0)
MCH: 30.5 pg (ref 26.0–34.0)
MCHC: 32.9 g/dL (ref 30.0–36.0)
MCV: 92.7 fL (ref 80.0–100.0)
Platelets: 169 10*3/uL (ref 150–400)
RBC: 3.97 MIL/uL — ABNORMAL LOW (ref 4.22–5.81)
RDW: 13.3 % (ref 11.5–15.5)
WBC: 4.6 10*3/uL (ref 4.0–10.5)
nRBC: 0 % (ref 0.0–0.2)

## 2021-01-12 LAB — GLUCOSE, CAPILLARY
Glucose-Capillary: 112 mg/dL — ABNORMAL HIGH (ref 70–99)
Glucose-Capillary: 176 mg/dL — ABNORMAL HIGH (ref 70–99)
Glucose-Capillary: 149 mg/dL — ABNORMAL HIGH (ref 70–99)
Glucose-Capillary: 170 mg/dL — ABNORMAL HIGH (ref 70–99)
Glucose-Capillary: 256 mg/dL — ABNORMAL HIGH (ref 70–99)

## 2021-01-12 LAB — BASIC METABOLIC PANEL
Anion gap: 6 (ref 5–15)
BUN: 10 mg/dL (ref 8–23)
CO2: 26 mmol/L (ref 22–32)
Calcium: 8.8 mg/dL — ABNORMAL LOW (ref 8.9–10.3)
Chloride: 101 mmol/L (ref 98–111)
Creatinine, Ser: 0.89 mg/dL (ref 0.61–1.24)
GFR, Estimated: >60 mL/min (ref >60)
Glucose, Bld: 126 mg/dL — ABNORMAL HIGH (ref 70–99)
Potassium: 4.5 mmol/L (ref 3.5–5.1)
Sodium: 133 mmol/L — ABNORMAL LOW (ref 135–145)

## 2021-01-12 LAB — MRSA PCR SCREENING: MRSA by PCR: NEGATIVE

## 2021-01-12 MED ORDER — CHLORHEXIDINE GLUCONATE CLOTH 2 % EX PADS: 6.0000 | MEDICATED_PAD | Freq: Every day | CUTANEOUS | Status: DC

## 2021-01-12 NOTE — Discharge Summary (Signed)
Physician Discharge Summary  Michael Guzman:[id number] DOB: 08-12-1946 DOA: 01/11/2021  PCP: [name], MD  Admit date: 01/11/2021  Discharge date: 01/12/2021  Admitted From:Home  Disposition:  Home  Recommendations for Outpatient Follow-up:  1. Follow up with PCP in 1-2 weeks 2. Continue on home medications as prescribed and do not self-administer higher doses of insulin than prescribed  Home Health:None  Equipment/Devices:None  Discharge Condition:Stable  CODE STATUS: Full  Diet recommendation: Heart Healthy/Carb Modified  Brief/Interim Summary: Michael Guzman is a 75 y.o. male with medical history significant for hypertension, coronary artery disease, diabetes mellitus, prostate cancer. Patient was brought to the ED via EMS reports of unresponsiveness.  It appears that he thought his blood glucose levels were elevated and therefore took double his usual insulin dose.  He was supposed to take 50 units of insulin and ended up taking 100 units which caused him to be much less responsive.  He was noted to have lower blood glucose levels upon EMS arrival and was given an amp of D50 with some improvement noted.  He still continues to have periods of unresponsiveness and fluctuations in his blood glucose levels in the ED and therefore he was admitted for further evaluation and started on a D10 drip.  His blood glucose levels have improved and he was much more alert and back at baseline according to the wife at bedside.  He is now able to tolerate his meals and appears stable for discharge.  No other acute findings noted on head imaging or lab work.  He is stable for discharge and has been encouraged to remain on his usual insulin dose and monitor his blood glucose carefully and follow-up with his PCP.  Discharge Diagnoses:  Principal Problem:   AMS (altered mental status) Active Problems:   Hypertension (1991)   ASCAD s/p PTCA (06/2012)   Hyperlipidemia associated with type  2 diabetes mellitus ([place])   Diabetic peripheral neuropathy (HCC)   Hypoglycemia due to insulin  Principal discharge diagnosis: Acute metabolic encephalopathy secondary to hypoglycemia due to self administration of increased dose of insulin.  Discharge Instructions  Discharge Instructions    Diet - low sodium heart healthy   Complete by: As directed    Increase activity slowly   Complete by: As directed      Allergies as of 01/12/2021   No Known Allergies     Medication List    TAKE these medications   aspirin EC 81 MG tablet Take 1 tablet (81 mg total) by mouth daily.   atorvastatin 10 MG tablet Commonly known as: Lipitor Take 1 tablet (10 mg total) by mouth daily.   buPROPion 300 MG 24 hr tablet Commonly known as: WELLBUTRIN XL TAKE 1 TABLET BY MOUTH  DAILY FOR MOOD, FOCUS AND  CONCENTRATION   docusate sodium 100 MG capsule Commonly known as: Colace Take 1 capsule (100 mg total) by mouth 2 (two) times daily.   DULoxetine 60 MG capsule Commonly known as: Cymbalta Take  1 capsule  Daily  for Mood & Chronic Pain   furosemide 40 MG tablet Commonly known as: Lasix Take 1 tablet 2 x /day as needed  for Fluid Retention   gabapentin 800 MG tablet Commonly known as: NEURONTIN TAKE 1 TABLET BY MOUTH 3 TO 4 TIMES DAILY FOR PAIN, HOT FLASHES AND SWEATS What changed:   how much to take  how to take this  when to take this  additional instructions   HumuLIN 70/30 (70-30) 100  UNIT/ML injection Generic drug: insulin NPH-regular Human INJECT 50 UNITS INTO SKIN  TWICE DAILY What changed:   how much to take  how to take this  when to take this  additional instructions   Insulin Syringes (Disposable) U-100 1 ML Misc 50 Units by Does not apply route 2 (two) times daily. inject 50 units into skin 2 x /day   latanoprost 0.005 % ophthalmic solution Commonly known as: XALATAN Place 1 drop into both eyes at bedtime.   Magnesium 250 MG Tabs Take 250 mg by mouth  daily.   metFORMIN 500 MG 24 hr tablet Commonly known as: GLUCOPHAGE-XR TAKE 2 TABLETS BY MOUTH  TWICE DAILY WITH MEALS FOR  DIABETES   metoprolol succinate 25 MG 24 hr tablet Commonly known as: TOPROL-XL TAKE ONE-HALF TABLET BY  MOUTH DAILY WITH OR  IMMEDIATELY FOLLOWING A  MEAL. What changed: See the new instructions.   montelukast 10 MG tablet Commonly known as: SINGULAIR TAKE 1 TABLET BY MOUTH  DAILY FOR ALLERGIES   multivitamin with minerals Tabs tablet Take 1 tablet by mouth daily.   nitroGLYCERIN 0.4 MG SL tablet Commonly known as: NITROSTAT PLACE 1 TABLET UNDER THE TONGUE EVERY 5 MINUTES AS NEEDED FOR CHEST PAIN What changed:   how much to take  how to take this  when to take this  reasons to take this   [place] KIT w/Device Kit Check blood sugar 1 time [id number]   OneTouch Delica Plus [id number] Misc CHECK BLOOD SUGAR 3 TIMES  DAILY   OneTouch Ultra test strip Generic drug: glucose blood CHECK BLOOD SUGAR 3 TIMES  DAILY (Dx: e11.29)   pantoprazole 40 MG tablet Commonly known as: PROTONIX TAKE 1 TABLET BY MOUTH  DAILY TO PREVENT HEARTBURN  AND INDIGESTION   Systane 0.4-0.3 % Soln Generic drug: Polyethyl Glycol-Propyl Glycol Place 2 drops into both eyes daily as needed (for dry eyes).   tamsulosin 0.4 MG Caps capsule Commonly known as: FLOMAX Take 0.4 mg by mouth at bedtime.   vitamin C 100 MG tablet Take 100 mg by mouth daily.   Vitamin D3 125 MCG (5000 UT) Caps Take 10,000 Units by mouth daily.   Zinc 30 MG Tabs Take 30 mg by mouth daily.       Follow-up Information    [name], MD. Schedule an appointment as soon as possible for a visit in 1 week(s).   Specialty: Internal Medicine Contact information: [id number] [place] [address] [phone]              No Known Allergies  Consultations:  None   Procedures/Studies: CT Angio Head W or Wo Contrast  Result Date:  01/11/2021 CLINICAL DATA:  Unresponsive patient.  Negative acute head CT. EXAM: CT ANGIOGRAPHY HEAD AND NECK TECHNIQUE: Multidetector CT imaging of the head and neck was performed using the standard protocol during bolus administration of intravenous contrast. Multiplanar CT image reconstructions and MIPs were obtained to evaluate the vascular anatomy. Carotid stenosis measurements (when applicable) are obtained utilizing NASCET criteria, using the distal internal carotid diameter as the denominator. CONTRAST:  75m OMNIPAQUE IOHEXOL 350 MG/ML SOLN COMPARISON:  Head CT same day FINDINGS: CTA NECK FINDINGS Aortic arch: Minimal aortic atherosclerotic calcification. Branching pattern is normal. Right carotid system: Common carotid artery widely patent to the bifurcation. Carotid bifurcation shows minimal calcified plaque but no stenosis. Cervical ICA is normal. Left carotid system: Common carotid artery widely patent to the bifurcation. Carotid bifurcation shows minimal calcified  plaque but no stenosis. Cervical ICA is normal. Vertebral arteries: Both vertebral artery origins are patent. Both vertebral arteries appear normal through the cervical region to foramen magnum. Skeleton: Degenerative arthritis at the C1-2 articulation. Minimal mid cervical spondylosis. Other neck: No mass or lymphadenopathy. Upper chest: No active process. Review of the MIP images confirms the above findings CTA HEAD FINDINGS Anterior circulation: Both internal carotid arteries widely patent through the skull base and siphon regions. The anterior and middle cerebral vessels are normal without proximal stenosis, aneurysm or vascular malformation. No large or medium vessel occlusion. Posterior circulation: Both vertebral arteries widely patent to the basilar. No basilar stenosis. Posterior circulation branch vessels are normal. Venous sinuses: Patent and normal. Anatomic variants: None significant. Review of the MIP images confirms the above  findings IMPRESSION: Negative CT angiography of the neck vessels and intracranial vessels. No large or medium vessel occlusion or correctable proximal stenosis. No aneurysm or vascular malformation. Electronically Signed   By: Nelson Chimes M.D.   On: 01/11/2021 15:31   CT Angio Neck W and/or Wo Contrast  Result Date: 01/11/2021 CLINICAL DATA:  Unresponsive patient.  Negative acute head CT. EXAM: CT ANGIOGRAPHY HEAD AND NECK TECHNIQUE: Multidetector CT imaging of the head and neck was performed using the standard protocol during bolus administration of intravenous contrast. Multiplanar CT image reconstructions and MIPs were obtained to evaluate the vascular anatomy. Carotid stenosis measurements (when applicable) are obtained utilizing NASCET criteria, using the distal internal carotid diameter as the denominator. CONTRAST:  51m OMNIPAQUE IOHEXOL 350 MG/ML SOLN COMPARISON:  Head CT same day FINDINGS: CTA NECK FINDINGS Aortic arch: Minimal aortic atherosclerotic calcification. Branching pattern is normal. Right carotid system: Common carotid artery widely patent to the bifurcation. Carotid bifurcation shows minimal calcified plaque but no stenosis. Cervical ICA is normal. Left carotid system: Common carotid artery widely patent to the bifurcation. Carotid bifurcation shows minimal calcified plaque but no stenosis. Cervical ICA is normal. Vertebral arteries: Both vertebral artery origins are patent. Both vertebral arteries appear normal through the cervical region to foramen magnum. Skeleton: Degenerative arthritis at the C1-2 articulation. Minimal mid cervical spondylosis. Other neck: No mass or lymphadenopathy. Upper chest: No active process. Review of the MIP images confirms the above findings CTA HEAD FINDINGS Anterior circulation: Both internal carotid arteries widely patent through the skull base and siphon regions. The anterior and middle cerebral vessels are normal without proximal stenosis, aneurysm or  vascular malformation. No large or medium vessel occlusion. Posterior circulation: Both vertebral arteries widely patent to the basilar. No basilar stenosis. Posterior circulation branch vessels are normal. Venous sinuses: Patent and normal. Anatomic variants: None significant. Review of the MIP images confirms the above findings IMPRESSION: Negative CT angiography of the neck vessels and intracranial vessels. No large or medium vessel occlusion or correctable proximal stenosis. No aneurysm or vascular malformation. Electronically Signed   By: MNelson ChimesM.D.   On: 01/11/2021 15:31   DG Chest Portable 1 View  Result Date: 01/11/2021 CLINICAL DATA:  Shortness of breath. EXAM: PORTABLE CHEST 1 VIEW COMPARISON:  November 21, 2018 FINDINGS: The heart size and mediastinal contours are within normal limits. Both lungs are clear. The visualized skeletal structures are unremarkable. Aortic calcifications are noted. The lung volumes are low. IMPRESSION: No active disease. Electronically Signed   By: CConstance HolsterM.D.   On: 01/11/2021 16:24   CT HEAD CODE STROKE WO CONTRAST  Result Date: 01/11/2021 CLINICAL DATA:  Code stroke. Patient unresponsive. Acute stroke suspected. EXAM:  CT HEAD WITHOUT CONTRAST TECHNIQUE: Contiguous axial images were obtained from the base of the skull through the vertex without intravenous contrast. COMPARISON:  01/31/2018 FINDINGS: Brain: Generalized atrophy. No sign of old or acute focal infarction, mass lesion, hemorrhage, hydrocephalus or extra-axial collection. Vascular: No abnormal vascular finding. Skull: Normal Sinuses/Orbits: Clear/normal Other: None ASPECTS ([place] Stroke Program Early CT Score) - Ganglionic level infarction (caudate, lentiform nuclei, internal capsule, insula, M1-M3 cortex): 7 - Supraganglionic infarction (M4-M6 cortex): 3 Total score (0-10 with 10 being normal): 10 IMPRESSION: 1. Generalized atrophy.  No focal or acute finding. 2. ASPECTS is 10. 3. These  results were called by telephone at the time of interpretation on 01/11/2021 at 3:04 pm to provider [facility] , who verbally acknowledged these results. Electronically Signed   By: Nelson Chimes M.D.   On: 01/11/2021 15:05      Discharge Exam: Vitals:   01/12/21 0800 01/12/21 0900  BP: (!) 173/70 (!) 163/83  Pulse: 94 90  Resp: 16 15  Temp:    SpO2: 100% 97%   Vitals:   01/12/21 0600 01/12/21 0700 01/12/21 0800 01/12/21 0900  BP: (!) 142/88 (!) 169/78 (!) 173/70 (!) 163/83  Pulse: 99 90 94 90  Resp: (!) _0 Temp:      TempSrc:      SpO2: (!) 88% 94% 100% 97%  Weight:      Height:        General: Pt is alert, awake, not in acute distress Cardiovascular: RRR, S1/S2 +, no rubs, no gallops Respiratory: CTA bilaterally, no wheezing, no rhonchi Abdominal: Soft, NT, ND, bowel sounds + Extremities: no edema, no cyanosis    The results of significant diagnostics from this hospitalization (including imaging, microbiology, ancillary and laboratory) are listed below for reference.     Microbiology: Recent Results (from the past 240 hour(s))  Blood culture (routine x 2)     Status: None (Preliminary result)   Collection Time: 01/11/21  2:33 PM   Specimen: BLOOD  Result Value Ref Range Status   Specimen Description BLOOD BLOOD LEFT ARM  Final   Special Requests   Final    Blood Culture adequate volume BOTTLES DRAWN AEROBIC ONLY   Culture   Final    NO GROWTH < 24 HOURS Performed at [facility], [address] [place], [place] [address]    Report Status PENDING  Incomplete  Blood culture (routine x 2)     Status: None (Preliminary result)   Collection Time: 01/11/21  2:33 PM   Specimen: BLOOD  Result Value Ref Range Status   Specimen Description BLOOD LEFT ANTECUBITAL  Final   Special Requests   Final    Blood Culture results may not be optimal due to an inadequate volume of blood received in culture bottles BOTTLES DRAWN AEROBIC AND ANAEROBIC   Culture   Final     NO GROWTH < 24 HOURS Performed at [facility], [address] [place], [place] [address]    Report Status PENDING  Incomplete  Resp Panel by RT-PCR (Flu A&B, Covid) Nasopharyngeal Swab     Status: None   Collection Time: 01/11/21  5:23 PM   Specimen: Nasopharyngeal Swab; Nasopharyngeal(NP) swabs in vial transport medium  Result Value Ref Range Status   SARS Coronavirus 2 by RT PCR NEGATIVE NEGATIVE Final    Comment: (NOTE) SARS-CoV-2 target nucleic acids are NOT DETECTED.  The SARS-CoV-2 RNA is generally detectable in upper respiratory specimens during the acute  phase of infection. The lowest concentration of SARS-CoV-2 viral copies this assay can detect is 138 copies/mL. A negative result does not preclude SARS-Cov-2 infection and should not be used as the sole basis for treatment or other patient management decisions. A negative result may occur with  improper specimen collection/handling, submission of specimen other than nasopharyngeal swab, presence of viral mutation(s) within the areas targeted by this assay, and inadequate number of viral copies(<138 copies/mL). A negative result must be combined with clinical observations, patient history, and epidemiological information. The expected result is Negative.  Fact Sheet for Patients:  EntrepreneurPulse.com.au  Fact Sheet for Healthcare Providers:  IncredibleEmployment.be  This test is no t yet approved or cleared by the [place] FDA and  has been authorized for detection and/or diagnosis of SARS-CoV-2 by FDA under an Emergency Use Authorization (EUA). This EUA will remain  in effect (meaning this test can be used) for the duration of the COVID-19 declaration under Section 564(b)(1) of the Act, 21 U.S.C.section 360bbb-3(b)(1), unless the authorization is terminated  or revoked sooner.       Influenza A by PCR NEGATIVE NEGATIVE Final   Influenza B by PCR NEGATIVE NEGATIVE Final     Comment: (NOTE) The Xpert Xpress SARS-CoV-2/FLU/RSV plus assay is intended as an aid in the diagnosis of influenza from Nasopharyngeal swab specimens and should not be used as a sole basis for treatment. Nasal washings and aspirates are unacceptable for Xpert Xpress SARS-CoV-2/FLU/RSV testing.  Fact Sheet for Patients: EntrepreneurPulse.com.au  Fact Sheet for Healthcare Providers: IncredibleEmployment.be  This test is not yet approved or cleared by the [place] FDA and has been authorized for detection and/or diagnosis of SARS-CoV-2 by FDA under an Emergency Use Authorization (EUA). This EUA will remain in effect (meaning this test can be used) for the duration of the COVID-19 declaration under Section 564(b)(1) of the Act, 21 U.S.C. section 360bbb-3(b)(1), unless the authorization is terminated or revoked.  Performed at [facility], [address] [place],  [address]      Labs: BNP (last 3 results) No results for input(s): BNP in the last 8760 hours. Basic Metabolic Panel: Recent Labs  Lab 01/11/21 1433 01/11/21 1834 01/12/21 0415  NA 135  --  133*  K 3.3*  --  4.5  CL 103  --  101  CO2 23  --  26  GLUCOSE 77  --  126*  BUN 14  --  10  CREATININE 0.90  --  0.89  CALCIUM 9.1  --  8.8*  MG  --  1.7  --    Liver Function Tests: Recent Labs  Lab 01/11/21 1433  AST 58*  ALT 49*  ALKPHOS 55  BILITOT 0.8  PROT 6.5  ALBUMIN 4.1   No results for input(s): LIPASE, AMYLASE in the last 168 hours. Recent Labs  Lab 01/11/21 1455  AMMONIA 33   CBC: Recent Labs  Lab 01/11/21 1433 01/12/21 0415  WBC 5.5 4.6  NEUTROABS 3.4  --   HGB 11.9* 12.1*  HCT 37.2* 36.8*  MCV 94.7 92.7  PLT 149* 169   Cardiac Enzymes: No results for input(s): CKTOTAL, CKMB, CKMBINDEX, TROPONINI in the last 168 hours. BNP: Invalid input(s): POCBNP CBG: Recent Labs  Lab 01/11/21 2322 01/12/21 0223 01/12/21 0401 01/12/21 0621  01/12/21 0821  GLUCAP 118* 112* 176* 149* 170*   D-Dimer No results for input(s): DDIMER in the last 72 hours. Hgb A1c No results for input(s): HGBA1C in the last 72 hours.  Lipid Profile No results for input(s): CHOL, HDL, LDLCALC, TRIG, CHOLHDL, LDLDIRECT in the last 72 hours. Thyroid function studies Recent Labs    01/11/21 1433  TSH 0.684   Anemia work up No results for input(s): VITAMINB12, FOLATE, FERRITIN, TIBC, IRON, RETICCTPCT in the last 72 hours. Urinalysis    Component Value Date/Time   COLORURINE STRAW (A) 01/11/2021 1835   APPEARANCEUR CLEAR 01/11/2021 1835   LABSPEC 1.031 (H) 01/11/2021 1835   LABSPEC 1.020 10/14/2016 1230   PHURINE 6.0 01/11/2021 1835   GLUCOSEU 50 (A) 01/11/2021 1835   GLUCOSEU 2,000 10/14/2016 1230   [place] 01/11/2021 [place] 01/11/2021 1835   BILIRUBINUR Negative 10/14/2016 [place] 01/11/2021 [place] 01/11/2021 1835   UROBILINOGEN 0.2 10/14/2016 1230   NITRITE NEGATIVE 01/11/2021 [place] 01/11/2021 1835   LEUKOCYTESUR Negative 10/14/2016 1230   Sepsis Labs Invalid input(s): PROCALCITONIN,  WBC,  LACTICIDVEN Microbiology Recent Results (from the past 240 hour(s))  Blood culture (routine x 2)     Status: None (Preliminary result)   Collection Time: 01/11/21  2:33 PM   Specimen: BLOOD  Result Value Ref Range Status   Specimen Description BLOOD BLOOD LEFT ARM  Final   Special Requests   Final    Blood Culture adequate volume BOTTLES DRAWN AEROBIC ONLY   Culture   Final    NO GROWTH < 24 HOURS Performed at [facility], [address] [place], [place] [address]    Report Status PENDING  Incomplete  Blood culture (routine x 2)     Status: None (Preliminary result)   Collection Time: 01/11/21  2:33 PM   Specimen: BLOOD  Result Value Ref Range Status   Specimen Description BLOOD LEFT ANTECUBITAL  Final   Special Requests   Final    Blood Culture  results may not be optimal due to an inadequate volume of blood received in culture bottles BOTTLES DRAWN AEROBIC AND ANAEROBIC   Culture   Final    NO GROWTH < 24 HOURS Performed at [facility], [address] [place], [place] [address]    Report Status PENDING  Incomplete  Resp Panel by RT-PCR (Flu A&B, Covid) Nasopharyngeal Swab     Status: None   Collection Time: 01/11/21  5:23 PM   Specimen: Nasopharyngeal Swab; Nasopharyngeal(NP) swabs in vial transport medium  Result Value Ref Range Status   SARS Coronavirus 2 by RT PCR NEGATIVE NEGATIVE Final    Comment: (NOTE) SARS-CoV-2 target nucleic acids are NOT DETECTED.  The SARS-CoV-2 RNA is generally detectable in upper respiratory specimens during the acute phase of infection. The lowest concentration of SARS-CoV-2 viral copies this assay can detect is 138 copies/mL. A negative result does not preclude SARS-Cov-2 infection and should not be used as the sole basis for treatment or other patient management decisions. A negative result may occur with  improper specimen collection/handling, submission of specimen other than nasopharyngeal swab, presence of viral mutation(s) within the areas targeted by this assay, and inadequate number of viral copies(<138 copies/mL). A negative result must be combined with clinical observations, patient history, and epidemiological information. The expected result is Negative.  Fact Sheet for Patients:  EntrepreneurPulse.com.au  Fact Sheet for Healthcare Providers:  IncredibleEmployment.be  This test is no t yet approved or cleared by the [place] FDA and  has been authorized for detection and/or diagnosis of SARS-CoV-2 by FDA under an Emergency Use Authorization (EUA). This EUA  will remain  in effect (meaning this test can be used) for the duration of the COVID-19 declaration under Section 564(b)(1) of the Act, 21 U.S.C.section 360bbb-3(b)(1), unless the  authorization is terminated  or revoked sooner.       Influenza A by PCR NEGATIVE NEGATIVE Final   Influenza B by PCR NEGATIVE NEGATIVE Final    Comment: (NOTE) The Xpert Xpress SARS-CoV-2/FLU/RSV plus assay is intended as an aid in the diagnosis of influenza from Nasopharyngeal swab specimens and should not be used as a sole basis for treatment. Nasal washings and aspirates are unacceptable for Xpert Xpress SARS-CoV-2/FLU/RSV testing.  Fact Sheet for Patients: EntrepreneurPulse.com.au  Fact Sheet for Healthcare Providers: IncredibleEmployment.be  This test is not yet approved or cleared by the [place] FDA and has been authorized for detection and/or diagnosis of SARS-CoV-2 by FDA under an Emergency Use Authorization (EUA). This EUA will remain in effect (meaning this test can be used) for the duration of the COVID-19 declaration under Section 564(b)(1) of the Act, 21 U.S.C. section 360bbb-3(b)(1), unless the authorization is terminated or revoked.  Performed at [facility], [address] [place], [place] [address]      Time coordinating discharge: 35 minutes  SIGNED:   Rodena Goldmann, DO [facility] 01/12/2021, 11:04 AM  If 7PM-7AM, please contact night-coverage [email or web address]

## 2021-01-12 NOTE — Progress Notes (Signed)
1445 call time 1455 exam started 1500 exam finished 1500 images sent to soc 1501 exam completed in epic 1459 [facility] radiology called

## 2021-01-12 NOTE — Plan of Care (Signed)
  Problem: Education: Goal: Knowledge of General Education information will improve Description: Including pain rating scale, medication(s)/side effects and non-pharmacologic comfort measures Outcome: Progressing   Problem: Health Behavior/Discharge Planning: Goal: Ability to manage health-related needs will improve Outcome: Progressing   Problem: Nutrition: Goal: Adequate nutrition will be maintained Outcome: Progressing   

## 2021-01-12 NOTE — Progress Notes (Signed)
Patient being discharged to home. Did eat breakfast and a snack before discharge. All IV access removed and patient was allowed to dress with wife at side. Not complaining of pain or discomfort.

## 2021-01-12 NOTE — Care Management CC44 (Signed)
Condition Code 44 Documentation Completed  Patient Details  Name: MIKLOS BIDINGER MRN: [id number] Date of Birth: 07/28/1946   Condition Code 44 given:  Yes Patient signature on Condition Code 44 notice:  Yes Documentation of 2 MD's agreement:  Yes Code 44 added to claim:  Yes    Adelene Amas, [place] 01/12/2021, 11:40 AM

## 2021-01-12 NOTE — Care Management Obs Status (Signed)
[facility] NOTIFICATION   Patient Details  Name: KALIK HOARE MRN: [id number] Date of Birth: 12/24/1945   Medicare Observation Status Notification Given:  Yes    Adelene Amas, [place] 01/12/2021, 11:40 AM

## 2021-01-14 ENCOUNTER — Telehealth: Payer: Self-pay | Admitting: *Deleted

## 2021-01-14 NOTE — Telephone Encounter (Signed)
Called patient on 01/14/2021 , 9:18 AM in an attempt to reach the patient for a hospital follow up.   Admit date: 01/11/21 Discharge: 01/12/21   He does not have any questions or concerns about medications from the hospital admission. The patient's medications were reviewed over the phone, they were counseled to bring in all current medications to the hospital follow up visit.   I advised the patient to call if any questions or concerns arise about the hospital admission or medications. There were no medication changes from his hospital admission.   Home health was not started in the hospital.  All questions were answered and a follow up appointment was made. Appointment scheduled with Dr Melford Aase on 01/21/2021.  Prior to Admission medications   Medication Sig Start Date End Date Taking? Authorizing Provider  Ascorbic Acid (VITAMIN C) 100 MG tablet Take 100 mg by mouth daily.    [provider]  aspirin EC 81 MG tablet Take 1 tablet (81 mg total) by mouth daily. 10/27/19   Jerline Pain, MD  atorvastatin (LIPITOR) 10 MG tablet Take 1 tablet (10 mg total) by mouth daily. 01/08/21 01/08/22  Liane Comber, NP  Blood Glucose Monitoring Suppl (ONE TOUCH ULTRA SYSTEM KIT) w/Device KIT Check blood sugar 1 time daily-DX-E11.22 04/09/16   [name], MD  buPROPion (WELLBUTRIN XL) 300 MG 24 hr tablet TAKE 1 TABLET BY MOUTH  DAILY FOR MOOD, FOCUS AND  CONCENTRATION 04/09/20   Liane Comber, NP  Cholecalciferol (VITAMIN D3) 125 MCG (5000 UT) CAPS Take 10,000 Units by mouth daily.     [provider]  docusate sodium (COLACE) 100 MG capsule Take 1 capsule (100 mg total) by mouth 2 (two) times daily. 04/28/19   Gary Fleet, PA-C  DULoxetine (CYMBALTA) 60 MG capsule Take  1 capsule  Daily  for Mood & Chronic Pain 12/12/20   [name], MD  furosemide (LASIX) 40 MG tablet Take 1 tablet 2 x /day as needed  for Fluid Retention 10/13/19   [name], MD  gabapentin (NEURONTIN) 800  MG tablet TAKE 1 TABLET BY MOUTH 3 TO 4 TIMES DAILY FOR PAIN, HOT FLASHES AND SWEATS Patient taking differently: Take 1,600 mg by mouth in the morning and at bedtime. 07/31/20   Liane Comber, NP  glucose blood (ONETOUCH ULTRA) test strip CHECK BLOOD SUGAR 3 TIMES  DAILY (Dx: e11.29) 10/26/20   [name], MD  insulin NPH-regular Human (HUMULIN 70/30) (70-30) 100 UNIT/ML injection INJECT 50 UNITS INTO SKIN  TWICE DAILY Patient taking differently: Inject 50 Units into the skin 2 (two) times daily with a meal. 09/25/20   Liane Comber, NP  Insulin Syringes, Disposable, U-100 1 ML MISC 50 Units by Does not apply route 2 (two) times daily. inject 50 units into skin 2 x /day 07/23/20   [name], MD  Lancets [facility] DELICA PLUS [id number]) [place] CHECK BLOOD SUGAR 3 TIMES  DAILY 08/16/19   Liane Comber, NP  latanoprost (XALATAN) 0.005 % ophthalmic solution Place 1 drop into both eyes at bedtime. 09/02/20   [provider]  Magnesium 250 MG TABS Take 250 mg by mouth daily.     [provider]  metFORMIN (GLUCOPHAGE-XR) 500 MG 24 hr tablet TAKE 2 TABLETS BY MOUTH  TWICE DAILY WITH MEALS FOR  DIABETES 07/31/20   Liane Comber, NP  metoprolol succinate (TOPROL-XL) 25 MG 24 hr tablet TAKE ONE-HALF TABLET BY  MOUTH DAILY WITH OR  IMMEDIATELY FOLLOWING A  MEAL. Patient taking differently:  Take 12.5 mg by mouth daily. 06/18/20   Jerline Pain, MD  montelukast (SINGULAIR) 10 MG tablet TAKE 1 TABLET BY MOUTH  DAILY FOR ALLERGIES 09/21/20   Liane Comber, NP  Multiple Vitamin (MULTIVITAMIN WITH MINERALS) TABS tablet Take 1 tablet by mouth daily.    [provider]  nitroGLYCERIN (NITROSTAT) 0.4 MG SL tablet PLACE 1 TABLET UNDER THE TONGUE EVERY 5 MINUTES AS NEEDED FOR CHEST PAIN Patient taking differently: Place 0.4 mg under the tongue every 5 (five) minutes as needed for chest pain. PLACE 1 TABLET UNDER THE TONGUE EVERY 5 MINUTES AS NEEDED FOR CHEST PAIN 05/02/18   [name], MD  pantoprazole (PROTONIX) 40 MG tablet TAKE 1 TABLET BY MOUTH  DAILY TO PREVENT HEARTBURN  AND INDIGESTION 10/02/20   Liane Comber, NP  Polyethyl Glycol-Propyl Glycol (SYSTANE) 0.4-0.3 % SOLN Place 2 drops into both eyes daily as needed (for dry eyes).    [provider]  tamsulosin (FLOMAX) 0.4 MG CAPS capsule Take 0.4 mg by mouth at bedtime.    [provider]  Zinc 30 MG TABS Take 30 mg by mouth daily.    [provider]

## 2021-01-17 LAB — CULTURE, BLOOD (ROUTINE X 2)
Culture: NO GROWTH
Culture: NO GROWTH
Special Requests: ADEQUATE

## 2021-01-20 ENCOUNTER — Encounter: Payer: Self-pay | Admitting: Internal Medicine

## 2021-01-20 NOTE — Progress Notes (Signed)
Future Appointments  Date Time Provider [place]  01/21/2021  9:30 AM [name], MD [facility] None  04/16/2021 10:00 AM [name], MD [facility] None  07/21/2021 11:30 AM Garnet Sierras, NP [facility] None   [facility] Follow-Up     This very nice 75 y.o. MBM was admitted to the hospital on 01/11/2021 and patient was discharged 9 days ago from the hospital on 01/12/2021. The patient now presents for follow up for transition from recent hospitalization .  The day after discharge  our clinical staff contacted the patient to assure stability and schedule a follow up appointment. The discharge summary, medications and diagnostic test results were reviewed before meeting with the patient. The patient was admitted for:   Persistent vegetative state ([place]) Hypoglycemia due to insulin  Essential hypertension ASCAD s/p PTCA (06/2012) Hyperlipidemia associated with type 2 diabetes mellitus ([place]) Diabetic peripheral neuropathy ([place])      This nice patient was brought to the ER after a period of AMS or unresponsiveness. Apparently the patient thought his blood sugar was elevated and doubled his dose of Insulin from his usual 50 units up to 100 units. He was given an amp of D 50W in transit by EMS and in the ER was admitted and started on a D 10W infusion. As patient's blood sugars normalized, he became more alert . CTA of Head & Neck returned Normal as did other diagnostic testing return normal. Covid & Flu tests were negative. Blood cultures were negative.        Hospitalization discharge instructions and medications are reconciled with the patient.      Patient is also followed with Hypertension, Hyperlipidemia, Pre-Diabetes and Vitamin D Deficiency.      Patient is treated for HTN & BP has been controlled at home. Today's BP is at goal - 122/76. Patient has had no complaints of any cardiac type chest pain, palpitations, dyspnea/orthopnea/PND, dizziness, claudication,  or dependent edema.     Hyperlipidemia is controlled with diet & meds. Patient denies myalgias or other med SE's. Last Lipids were at goal except elevated Trig's:  Lab Results  Component Value Date   CHOL 125 10/30/2020   HDL 48 10/30/2020   LDLCALC 50 10/30/2020   LDLDIRECT 45.7 10/30/2013   TRIG 197 (H) 10/30/2020   CHOLHDL 2.6 10/30/2020      Also, the patient has history of T2_NIDDM and has had no symptoms of  diabetic polys, paresthesias or visual blurring.  Last A1c was not at goal:  Lab Results  Component Value Date   HGBA1C 6.7 (H) 10/30/2020      Further, the patient also has history of Vitamin D Deficiency and supplements vitamin D without any suspected side-effects. Last vitamin D was  slightly elevated :  Lab Results  Component Value Date   VD25OH 106 (H) 10/30/2020   Current Outpatient Medications on File Prior to Visit  Medication Sig  . VITAMIN C 100 MG  Take 1 daily.  Marland Kitchen aspirin EC 81 MG  Take 1 tablet  daily.  Marland Kitchen atorvastatin 10 MG Take 1 tablet  daily.  Marland Kitchen buPROPion XL 300 MG 24 h TAKE 1 TABLET   DAILY   . VITAMIN D 5000 u Take 10,000 Units  daily.   Marland Kitchen COLACE 100 MG Take 1 capsule 2  times daily.  . CYMBALTA 60 MG capsule Take  1 capsule daily  . LASIX 40 MG tablet Take 1 tablet 2 x /day as needed  for Fluid  Retention  . NEURONTIN 800 MG tablet Take 1,600 mg  in the morning and at bedtime  . HUMULIN 70/30 INJECT 50 UNITS INTO SKIN  TWICE DAILY    . XALATAN ophthalmic solution Place 1 drop into both eyes at bedtime.  . Magnesium 250 MG TABS Take 250 mg  daily.   . metFORMIN-XR 500 MG 24  TAKE 2 TABLETS   TWICE DAILY   . metoprolol succinate-XL 25 MG 2 Take 12.5 mg daily  . montelukast  10 MG tablet TAKE 1 TABLET FOR ALLERGIES  . Multiple Vitamin Take 1 tablet  daily.  Marland Kitchen NITROSTAT)\ 0.4 MG SL tablet needed for chest pain  . pantoprazole40 MG tablet TAKE 1 TABLET   DAILY   . SYSTANE  SOLN Place 2 drops into both eyes daily as needed  . tamsulosin 0.4 MG CAPS  capsule Take\ at bedtime.  . Zinc 30 MG TABS Take  daily.    No Known Allergies   PMHx:   Past Medical History:  Diagnosis Date  . Arthritis   . CAD (coronary artery disease)    a.s/p DES to mid LAD and OM2 06/2012.  Marland Kitchen Chronic back pain   . Chronically dry eyes   . Diabetes mellitus without complication ([place])    TYPE 2   . DKA (diabetic ketoacidoses) 01/30/2017  . Elevated PSA    being monitored  . Fatty liver   . GERD (gastroesophageal reflux disease)   . Hx of radiation therapy    prostate , [facility]   . Hypertension   . Hypertriglyceridemia   . Intermediate coronary syndrome ([place]) 07/26/2012  . Prostate cancer ([place])    RADTIATION    Immunization History  Administered Date(s) Administered  . Influenza Split 06/26/2012  . Influenza, High Dose Seasonal PF 06/20/2015, 06/23/2016, 06/14/2017, 06/14/2019, 07/01/2020  . Influenza-Unspecified 06/14/2013, 06/25/2014, 06/03/2018  . Pneumococcal Conjugate-13 04/23/2016  . Pneumococcal Polysaccharide-23 06/14/2017  . Pneumococcal-Unspecified 06/16/2010  . Td 09/17/2003  . Tdap 11/26/2013  . Zoster 07/12/2012   Past Surgical History:  Procedure Laterality Date  . APPENDECTOMY    . CARDIAC SURGERY  ~2017   3 stent placed.  . COLONOSCOPY  02/12/2010   [place]  . LEFT HEART CATHETERIZATION WITH CORONARY ANGIOGRAM N/A 06/27/2012   Procedure: LEFT HEART CATHETERIZATION WITH CORONARY ANGIOGRAM;  Surgeon: Candee Furbish, MD;  Location[facility] CATH LAB;  Service: Cardiovascular;  Laterality: N/A;  . LEFT HEART CATHETERIZATION WITH CORONARY ANGIOGRAM N/A 07/25/2012   Procedure: LEFT HEART CATHETERIZATION WITH CORONARY ANGIOGRAM;  Surgeon: Sueanne Margarita, MD;  Location: [facility] CATH LAB;  Service: Cardiovascular;  Laterality: N/A;  . PROSTATE BIOPSY    . RADIOLOGY WITH ANESTHESIA N/A 12/03/2015   Procedure: MRI LUMBAR SPINE;  Surgeon: Medication Radiologist, MD;  Location: [facility];  Service: Radiology;  Laterality: N/A;  . TOTAL  HIP ARTHROPLASTY Right 04/28/2019   Procedure: TOTAL HIP ARTHROPLASTY ANTERIOR APPROACH;  Surgeon: Dorna Leitz, MD;  Location: [facility];  Service: Orthopedics;  Laterality: Right;   FHx:    Reviewed / unchanged  SHx:    Reviewed / unchanged  Systems Review:  Constitutional: Denies fever, chills, wt changes, headaches, insomnia, fatigue, night sweats, change in appetite. Eyes: Denies redness, blurred vision, diplopia, discharge, itchy, watery eyes.  ENT: Denies discharge, congestion, post nasal drip, epistaxis, sore throat, earache, hearing loss, dental pain, tinnitus, vertigo, sinus pain, snoring.  CV: Denies chest pain, palpitations, irregular heartbeat, syncope, dyspnea, diaphoresis, orthopnea, PND, claudication or edema. Respiratory: denies  cough, dyspnea, DOE, pleurisy, hoarseness, laryngitis, wheezing.  Gastrointestinal: Denies dysphagia, odynophagia, heartburn, reflux, water brash, abdominal pain or cramps, nausea, vomiting, bloating, diarrhea, constipation, hematemesis, melena, hematochezia  or hemorrhoids. Genitourinary: Denies dysuria, frequency, urgency, nocturia, hesitancy, discharge, hematuria or flank pain. Musculoskeletal: Denies arthralgias, myalgias, stiffness, jt. swelling, pain, limping or strain/sprain.  Skin: Denies pruritus, rash, hives, warts, acne, eczema or change in skin lesion(s). Neuro: No weakness, tremor, incoordination, spasms, paresthesia or pain. Psychiatric: Denies confusion, memory loss or sensory loss. Endo: Denies change in weight, skin or hair change.  Heme/Lymph: No excessive bleeding, bruising or enlarged lymph nodes.  Physical Exam  BP 122/76   Pulse 80   Temp 97.7 F (36.5 C)   Resp 16   Ht 5\' 9"  (1.753 m)   Wt 223 lb 12.8 oz (101.5 kg)   SpO2 98%   BMI 33.05 kg/m   Appears well nourished, well groomed  and in no distress.  Eyes: PERRLA, EOMs, conjunctiva no swelling or erythema. Sinuses: No frontal/maxillary tenderness ENT/Mouth: EAC's  clear, TM's nl w/o erythema, bulging. Nares clear w/o erythema, swelling, exudates. Oropharynx clear without erythema or exudates. Oral hygiene is good. Tongue normal, non obstructing. Hearing intact.  Neck: Supple. Thyroid nl. Car 2+/2+ without bruits, nodes or JVD. Chest: Respirations nl with BS clear & equal w/o rales, rhonchi, wheezing or stridor.  Cor: Heart sounds normal w/ regular rate and rhythm without sig. murmurs, gallops, clicks or rubs. Peripheral pulses normal and equal  without edema.  Abdomen: Soft & bowel sounds normal. Non-tender w/o guarding, rebound, hernias, masses or organomegaly.  Lymphatics: Unremarkable.  Musculoskeletal: Full ROM all peripheral extremities, joint stability, 5/5 strength and normal gait.  Skin: Warm, dry without exposed rashes, lesions or ecchymosis apparent.  Neuro: Cranial nerves intact, reflexes equal bilaterally. Sensory-motor testing grossly intact. Tendon reflexes grossly intact.  Pysch: Alert & oriented x 3.  Insight and judgement nl & appropriate. No ideations.  Assessment and Plan:  1. Stupor  - CBC with Differential/Platelet - COMPLETE METABOLIC PANEL WITH GFR  2. Hypoglycemia due to insulin  - COMPLETE METABOLIC PANEL WITH GFR  3. Essential hypertension  - Continue medication, monitor blood pressure at home.  - Continue DASH diet.  Reminder to go to the ER if any CP,  SOB, nausea, dizziness, severe HA, changes vision/speech.  - CBC with Differential/Platelet - COMPLETE METABOLIC PANEL WITH GFR  4. ASCAD s/p PTCA (06/2012)  - COMPLETE METABOLIC PANEL WITH GFR  5. Hyperlipidemia associated with type 2 diabetes mellitus ([facility])  - COMPLETE METABOLIC PANEL WITH GFR  6. Diabetic peripheral neuropathy ([place])   7. Medication management  - Diabetic teaching re: insulin dosing   - Continue diet, exercise, lifestyle modifications.  - Monitor appropriate labs. - Continue supplementation.  - CBC with Differential/Platelet -  COMPLETE METABOLIC PANEL WITH GFR         Discussed  regular exercise, BP monitoring, weight control to achieve/maintain BMI less than 25 and discussed meds and SE's. Recommended labs to assess and monitor clinical status with further disposition pending results of labs. Over 30 minutes of exam, counseling, chart review was performed.   Kirtland Bouchard, MD

## 2021-01-21 ENCOUNTER — Other Ambulatory Visit: Payer: Self-pay

## 2021-01-21 ENCOUNTER — Encounter: Payer: Self-pay | Admitting: Internal Medicine

## 2021-01-21 ENCOUNTER — Other Ambulatory Visit: Payer: Self-pay | Admitting: Cardiology

## 2021-01-21 ENCOUNTER — Ambulatory Visit (INDEPENDENT_AMBULATORY_CARE_PROVIDER_SITE_OTHER): Payer: Medicare Other | Admitting: Internal Medicine

## 2021-01-21 VITALS — BP 122/76 | HR 80 | Temp 97.7°F | Resp 16 | Ht 69.0 in | Wt 223.8 lb

## 2021-01-21 DIAGNOSIS — I10 Essential (primary) hypertension: Secondary | ICD-10-CM | POA: Diagnosis not present

## 2021-01-21 DIAGNOSIS — E160 Drug-induced hypoglycemia without coma: Secondary | ICD-10-CM

## 2021-01-21 DIAGNOSIS — E785 Hyperlipidemia, unspecified: Secondary | ICD-10-CM

## 2021-01-21 DIAGNOSIS — E1142 Type 2 diabetes mellitus with diabetic polyneuropathy: Secondary | ICD-10-CM | POA: Diagnosis not present

## 2021-01-21 DIAGNOSIS — E1169 Type 2 diabetes mellitus with other specified complication: Secondary | ICD-10-CM | POA: Diagnosis not present

## 2021-01-21 DIAGNOSIS — T383X5A Adverse effect of insulin and oral hypoglycemic [antidiabetic] drugs, initial encounter: Secondary | ICD-10-CM

## 2021-01-21 DIAGNOSIS — Z79899 Other long term (current) drug therapy: Secondary | ICD-10-CM | POA: Diagnosis not present

## 2021-01-21 DIAGNOSIS — Z9861 Coronary angioplasty status: Secondary | ICD-10-CM

## 2021-01-21 DIAGNOSIS — R401 Stupor: Secondary | ICD-10-CM | POA: Diagnosis not present

## 2021-01-21 DIAGNOSIS — I2510 Atherosclerotic heart disease of native coronary artery without angina pectoris: Secondary | ICD-10-CM

## 2021-01-21 NOTE — Patient Instructions (Signed)
Due to recent changes in healthcare laws, you may see the results of your imaging and laboratory studies on MyChart before your provider has had a chance to review them.  We understand that in some cases there may be results that are confusing or concerning to you. Not all laboratory results come back in the same time frame and the provider may be waiting for multiple results in order to interpret others.  Please give [place] 48 hours in order for your provider to thoroughly review all the results before contacting the office for clarification of your results.    +++++++++++++++++++++++++++++++  Hypoglycemia Hypoglycemia occurs when the level of sugar (glucose) in the blood is too low. Hypoglycemia can happen in people who have or do not have diabetes. It can develop quickly, and it can be a medical emergency. For most people, a blood glucose level below 70 mg/dL (3.9 mmol/L) is considered hypoglycemia. Glucose is a type of sugar that provides the body's main source of energy. Certain hormones (insulin and glucagon) control the level of glucose in the blood. Insulin lowers blood glucose, and glucagon raises blood glucose. Hypoglycemia can result from having too much insulin in the bloodstream, or from not eating enough food that contains glucose. You may also have reactive hypoglycemia, which happens within 4 hours after eating a meal. What are the causes? Hypoglycemia occurs most often in people who have diabetes and may be caused by:  Diabetes medicine.  Not eating enough, or not eating often enough.  Increased physical activity.  Drinking alcohol on an empty stomach. If you do not have diabetes, hypoglycemia may be caused by:  A tumor in the pancreas.  Not eating enough, or not eating for long periods at a time (fasting).  A severe infection or illness.  Problems after having bariatric surgery.  Organ failure, such as kidney or liver failure.  Certain medicines. What increases the  risk? Hypoglycemia is more likely to develop in people who:  Have diabetes and take medicines to lower blood glucose.  Abuse alcohol.  Have a severe illness. What are the signs or symptoms? Symptoms vary depending on whether the condition is mild, moderate, or severe. Mild hypoglycemia  Hunger.  Anxiety.  Sweating and feeling clammy.  Dizziness or feeling light-headed.  Sleepiness or restless sleep.  Nausea.  Increased heart rate.  Headache.  Blurry vision.  Irritability.  Tingling or numbness around the mouth, lips, or tongue.  A change in coordination. Moderate hypoglycemia  Confusion and poor judgment.  Behavior changes.  Weakness.  Irregular heartbeat. Severe hypoglycemia Severe hypoglycemia is a medical emergency. It can cause:  Fainting.  Seizures.  Loss of consciousness (coma).  Death. How is this diagnosed? Hypoglycemia is diagnosed with a blood test to measure your blood glucose level. This blood test is done while you are having symptoms. Your health care provider may also do a physical exam and review your medical history. How is this treated? This condition can be treated by immediately eating or drinking something that contains sugar with 15 grams of rapid-acting carbohydrate, such as:  4 oz (120 mL) of fruit juice.  4-6 oz (120-150 mL) of regular soda (not diet soda).  8 oz (240 mL) of low-fat milk.  Several pieces of hard candy. Check food labels to find out how many to eat for 15 grams.  1 Tbsp (15 mL) of sugar or honey. Treating hypoglycemia if you have diabetes If you are alert and able to swallow safely, follow the  15:15 rule:  Take 15 grams of a rapid-acting carbohydrate. Talk with your health care provider about how much you should take. Options for getting 15 grams of rapid-acting carbohydrate include: ? Glucose tablets (take 4 tablets). ? Several pieces of hard candy. Check food labels to find out how many pieces to eat  for 15 grams. ? 4 oz (120 mL) of fruit juice. ? 4-6 oz (120-150 mL) of regular (not diet) soda. ? 1 Tbsp (15 mL) of honey or sugar.  Check your blood glucose 15 minutes after you take the carbohydrate.  If the repeat blood glucose level is still at or below 70 mg/dL (3.9 mmol/L), take 15 grams of a carbohydrate again.  If your blood glucose level does not increase above 70 mg/dL (3.9 mmol/L) after 3 tries, seek emergency medical care.  After your blood glucose level returns to normal, eat a meal or a snack within 1 hour.  Treating severe hypoglycemia Severe hypoglycemia is when your blood glucose level is at or below 54 mg/dL (3 mmol/L). Severe hypoglycemia is a medical emergency. Get medical help right away. If you have severe hypoglycemia and you cannot eat or drink, you will need to be given glucagon. A family member or close friend should learn how to check your blood glucose and how to give you glucagon. Ask your health care provider if you need to have an emergency glucagon kit available. Severe hypoglycemia may need to be treated in a hospital. The treatment may include getting glucose through an IV. You may also need treatment for the cause of your hypoglycemia. Follow these instructions at home: General instructions  Take over-the-counter and prescription medicines only as told by your health care provider.  Monitor your blood glucose as told by your health care provider.  If you drink alcohol: ? Limit how much you use to:  0-1 drink a day for nonpregnant women.  0-2 drinks a day for men. ? Be aware of how much alcohol is in your drink. In the [place] one drink equals one 12 oz bottle of beer (355 mL), one 5 oz glass of wine (148 mL), or one 1 oz glass of hard liquor (44 mL).  Keep all follow-up visits as told by your health care provider. This is important. If you have diabetes:  Always have a rapid-acting carbohydrate (15 grams) option with you to treat low blood  glucose.  Follow your diabetes management plan as directed. Make sure you: ? Know the symptoms of hypoglycemia. It is important to treat it right away to prevent it from becoming severe. ? Check your blood glucose as often as told. Always check before and after exercise. ? Always check your blood glucose before you drive a motorized vehicle. ? Take your medicines as told. ? Follow your meal plan. Eat on time, and do not skip meals.  Share your diabetes management plan with people in your workplace, school, and household.  Carry a medical alert card or wear medical alert jewelry.  Contact a health care provider if:  You have problems keeping your blood glucose in your target range.  You have frequent episodes of hypoglycemia. Get help right away if:  You continue to have hypoglycemia symptoms after eating or drinking something that contains 15 grams of fast-acting carbohydrate and you cannot get your blood glucose above 70 mg/dL (3.9 mmol/L) while following the 15:15 rule.  Your blood glucose is at or below 54 mg/dL (3 mmol/L).  You have a seizure.  You  faint. These symptoms may represent a serious problem that is an emergency. Do not wait to see if the symptoms will go away. Get medical help right away. Call your local emergency services (911 in the [place] Do not drive yourself to the hospital. Summary  Hypoglycemia occurs when the level of sugar (glucose) in the blood is too low.  Hypoglycemia can happen in people who have or do not have diabetes. It can develop quickly, and it can be a medical emergency.  Make sure you know the symptoms of hypoglycemia and how to treat it.  Always have a rapid-acting carbohydrate snack with you to treat low blood sugar. This information is not intended to replace advice given to you by your health care provider. Make sure you discuss any questions you have with your health care provider. Document Revised: 07/26/2019 Document Reviewed:  07/26/2019 Elsevier Patient Education  2021 [place].  ++++++++++++++++++++++++++++++  Preventing Hypoglycemia Hypoglycemia occurs when the level of sugar (glucose) in the blood is too low. Hypoglycemia can happen in people who do or do not have diabetes (diabetes mellitus). It can develop quickly, and it can be a medical emergency. For most people with diabetes, a blood glucose level below 70 mg/dL (3.9 mmol/L) is considered hypoglycemia. Glucose is a type of sugar that provides the body's main source of energy. Certain hormones (insulin and glucagon) control the level of glucose in the blood. Insulin lowers blood glucose, and glucagon increases blood glucose. Hypoglycemia can result from having too much insulin in the bloodstream, or from not eating enough food that contains glucose. Your risk for hypoglycemia is higher:  If you take insulin or diabetes medicines to help lower your blood glucose or help your body make more insulin.  If you skip or delay a meal or snack.  If you are ill.  During and after exercise. You can prevent hypoglycemia by working with your health care provider to adjust your meal plan as needed and by taking other precautions. How can hypoglycemia affect me? Mild symptoms Mild hypoglycemia may not cause any symptoms. If you do have symptoms, they may include:  Hunger.  Anxiety.  Sweating and feeling clammy.  Dizziness or feeling light-headed.  Sleepiness.  Nausea.  Increased heart rate.  Headache.  Blurry vision.  Irritability.  Tingling or numbness around the mouth, lips, or tongue.  A change in coordination.  Restless sleep. If mild hypoglycemia is not recognized and treated, it can quickly become moderate or severe hypoglycemia. Moderate symptoms Moderate hypoglycemia can cause:  Mental confusion and poor judgment.  Behavior changes.  Weakness.  Irregular heartbeat. Severe symptoms Severe hypoglycemia is a medical emergency.  It can cause:  Fainting.  Seizures.  Loss of consciousness (coma).  Death. What nutrition changes can be made?  Work with your health care provider or diet and nutrition specialist (dietitian) to make a healthy meal plan that is right for you. Follow your meal plan carefully.  Eat meals at regular times.  If recommended by your health care provider, have snacks between meals.  Donot skip or delay meals or snacks. You can be at risk for hypoglycemia if you are not getting enough carbohydrates. What lifestyle changes can be made?  Work closely with your health care provider to manage your blood glucose. Make sure you know: ? Your goal blood glucose levels. ? How and when to check your blood glucose. ? The symptoms of hypoglycemia. It is important to treat it right away to keep it from  becoming severe.  Do not drink alcohol on an empty stomach.  When you are ill, check your blood glucose more often than usual. Follow your sick day plan whenever you cannot eat or drink normally. Make this plan in advance with your health care provider.  Always check your blood glucose before, during, and after exercise.   How is this treated? This condition can often be treated by immediately eating or drinking something that contains sugar, such as:  Fruit juice, 4-6 oz (120-150 mL).  Regular (not diet) soda, 4-6 oz (120-150 mL).  Low-fat milk, 4 oz (120 mL).  Several pieces of hard candy.  Sugar or honey, 1 Tbsp (15 mL). Treating hypoglycemia if you have diabetes If you are alert and able to swallow safely, follow the 15:15 rule:  Take 15 grams of a rapid-acting carbohydrate. Talk with your health care provider about how much you should take.  Rapid-acting options include: ? Glucose pills (take 15 grams). ? 6-8 pieces of hard candy. ? 4-6 oz (120-150 mL) of fruit juice. ? 4-6 oz (120-150 mL) of regular (not diet) soda.  Check your blood glucose 15 minutes after you take the  carbohydrate.  If the repeat blood glucose level is still at or below 70 mg/dL (3.9 mmol/L), take 15 grams of a carbohydrate again.  If your blood glucose level does not increase above 70 mg/dL (3.9 mmol/L) after 3 tries, seek emergency medical care.  After your blood glucose level returns to normal, eat a meal or a snack within 1 hour. Treating severe hypoglycemia Severe hypoglycemia is when your blood glucose level is at or below 54 mg/dL (3 mmol/L). Severe hypoglycemia is a medical emergency. Get medical help right away. If you have severe hypoglycemia and you cannot eat or drink, you may need an injection of glucagon. A family member or close friend should learn how to check your blood glucose and how to give you a glucagon injection. Ask your health care provider if you need to have an emergency glucagon injection kit available. Severe hypoglycemia may need to be treated in a hospital. The treatment may include getting glucose through an IV. You may also need treatment for the cause of your hypoglycemia. Where to find more information  American Diabetes Association: www.diabetes.[employer] of Diabetes and Digestive and Kidney Diseases: [employer] Contact a health care provider if:  You have problems keeping your blood glucose in your target range.  You have frequent episodes of hypoglycemia. Get help right away if:  You continue to have hypoglycemia symptoms after eating or drinking something containing glucose.  Your blood glucose level is at or below 54 mg/dL (3 mmol/L).  You faint.  You have a seizure. These symptoms may represent a serious problem that is an emergency. Do not wait to see if the symptoms will go away. Get medical help right away. Call your local emergency services (911 in the [place] Summary  Know the symptoms of hypoglycemia, and when you are at risk for it (such as during exercise or when you are sick). Check your blood glucose often when  you are at risk for hypoglycemia.  Hypoglycemia can develop quickly, and it can be dangerous if it is not treated right away. If you have a history of severe hypoglycemia, make sure you know how to use your glucagon injection kit.  Make sure you know how to treat hypoglycemia. Keep a carbohydrate snack available when you may be at risk for hypoglycemia. This information  is not intended to replace advice given to you by your health care provider. Make sure you discuss any questions you have with your health care provider. Document Revised: 12/23/2018 Document Reviewed: 04/28/2017 Elsevier Patient Education  2021 [place].  ++++++++++++++++++++++++++++++  Vit D  & Vit C 1,000 mg   are recommended to help protect  against the Covid-19 and other Corona viruses.    Also it's recommended  to take  Zinc 50 mg  to help  protect against the Covid-19   and best place to get  is also on [employer]com  and don't pay more than 6-8 cents /pill !  ================================ Coronavirus (COVID-19) Are you at risk?  Are you at risk for the Coronavirus (COVID-19)?  To be considered HIGH RISK for Coronavirus (COVID-19), you have to meet the following criteria:  . Traveled to [place], [place], [place], [place] or [place]; or in the [place] to [place], [place], [place]  . or [place]; and have fever, cough, and shortness of breath within the last 2 weeks of travel OR . Been in close contact with a person diagnosed with COVID-19 within the last 2 weeks and have  . fever, cough,and shortness of breath .  . IF YOU DO NOT MEET THESE CRITERIA, YOU ARE CONSIDERED LOW RISK FOR COVID-19.  What to do if you are HIGH RISK for COVID-19?  Marland Kitchen If you are having a medical emergency, call 911. . Seek medical care right away. Before you go to a doctor's office, urgent care or emergency department, .  call ahead and tell them about your recent travel, contact with someone diagnosed with COVID-19   .  and your symptoms.  . You should receive instructions from your physician's office regarding next steps of care.  . When you arrive at healthcare provider, tell the healthcare staff immediately you have returned from  . visiting [place], [place], [place], [place] or [place]; or traveled in the [place] to [place], [place],  . [place] or [place] in the last two weeks or you have been in close contact with a person diagnosed with  . COVID-19 in the last 2 weeks.   . Tell the health care staff about your symptoms: fever, cough and shortness of breath. . After you have been seen by a medical provider, you will be either: o Tested for (COVID-19) and discharged home on quarantine except to seek medical care if  o symptoms worsen, and asked to  - Stay home and avoid contact with others until you get your results (4-5 days)  - Avoid travel on public transportation if possible (such as bus, train, or airplane) or o Sent to the Emergency Department by EMS for evaluation, COVID-19 testing  and  o possible admission depending on your condition and test results.  What to do if you are LOW RISK for COVID-19?  Reduce your risk of any infection by using the same precautions used for avoiding the common cold or flu:  Marland Kitchen Wash your hands often with soap and warm water for at least 20 seconds.  If soap and water are not readily available,  . use an alcohol-based hand sanitizer with at least 60% alcohol.  . If coughing or sneezing, cover your mouth and nose by coughing or sneezing into the elbow areas of your shirt or coat, .  into a tissue or into your sleeve (not your hands). . Avoid shaking hands with others and consider head nods or  verbal greetings only. . Avoid touching your eyes, nose, or mouth with unwashed hands.  . Avoid close contact with people who are sick. . Avoid places or events with large numbers of people in one location, like concerts or sporting events. . Carefully consider  travel plans you have or are making. . If you are planning any travel outside or inside the [place], visit the CDC's Travelers' Health webpage for the latest health notices. . If you have some symptoms but not all symptoms, continue to monitor at home and seek medical attention  . if your symptoms worsen. . If you are having a medical emergency, call 911. >>>>>>>>>>>>>>>>>>>>>>>>>>>>>>>>>>>>>>>>>>>>>>>>>>>>>>> We Do NOT Approve of  [facility] [place] Soliciting Our Patients  To Do Home Visits  & We Do NOT Approve of [employer] > > > > > > > > > > > > > > > > > > > > > > > > > > > > > > > > > > >  > > > >   Preventive Care for Adults  A healthy lifestyle and preventive care can promote health and wellness. Preventive health guidelines for men include the following key practices:  A routine yearly physical is a good way to check with your health care provider about your health and preventative screening. It is a chance to share any concerns and updates on your health and to receive a thorough exam.  Visit your dentist for a routine exam and preventative care every 6 months. Brush your teeth twice a day and floss once a day. Good oral hygiene prevents tooth decay and gum disease.  The frequency of eye exams is based on your age, health, family medical history, use of contact lenses, and other factors. Follow your health care provider's recommendations for frequency of eye exams.  Eat a healthy diet. Foods such as vegetables, fruits, whole grains, low-fat dairy products, and lean protein foods contain the nutrients you need without too many calories. Decrease your intake of foods high in solid fats, added sugars, and salt. Eat the right amount of calories for you. Get information about a proper diet from your health care provider, if necessary.  Regular physical exercise is one of the most important things you can do for your health. Most adults should get at least 150 minutes  of moderate-intensity exercise (any activity that increases your heart rate and causes you to sweat) each week. In addition, most adults need muscle-strengthening exercises on 2 or more days a week.  Maintain a healthy weight. The body mass index (BMI) is a screening tool to identify possible weight problems. It provides an estimate of body fat based on height and weight. Your health care provider can find your BMI and can help you achieve or maintain a healthy weight. For adults 20 years and older:  A BMI below 18.5 is considered underweight.  A BMI of 18.5 to 24.9 is normal.  A BMI of 25 to 29.9 is considered overweight.  A BMI of 30 and above is considered obese.  Maintain normal blood lipids and cholesterol levels by exercising and minimizing your intake of saturated fat. Eat a balanced diet with plenty of fruit and vegetables. Blood tests for lipids and cholesterol should begin at age 11 and be repeated every 5 years. If your lipid or cholesterol levels are high, you are over 50, or you are at high risk for heart disease, you may need your cholesterol levels checked more frequently. Ongoing  high lipid and cholesterol levels should be treated with medicines if diet and exercise are not working.  If you smoke, find out from your health care provider how to quit. If you do not use tobacco, do not start.  Lung cancer screening is recommended for adults aged 22-80 years who are at high risk for developing lung cancer because of a history of smoking. A yearly low-dose CT scan of the lungs is recommended for people who have at least a 30-pack-year history of smoking and are a current smoker or have quit within the past 15 years. A pack year of smoking is smoking an average of 1 pack of cigarettes a day for 1 year (for example: 1 pack a day for 30 years or 2 packs a day for 15 years). Yearly screening should continue until the smoker has stopped smoking for at least 15 years. Yearly screening should be  stopped for people who develop a health problem that would prevent them from having lung cancer treatment.  If you choose to drink alcohol, do not have more than 2 drinks per day. One drink is considered to be 12 ounces (355 mL) of beer, 5 ounces (148 mL) of wine, or 1.5 ounces (44 mL) of liquor.  Avoid use of street drugs. Do not share needles with anyone. Ask for help if you need support or instructions about stopping the use of drugs.  High blood pressure causes heart disease and increases the risk of stroke. Your blood pressure should be checked at least every 1-2 years. Ongoing high blood pressure should be treated with medicines, if weight loss and exercise are not effective.  If you are 32-54 years old, ask your health care provider if you should take aspirin to prevent heart disease.  Diabetes screening involves taking a blood sample to check your fasting blood sugar level. Testing should be considered at a younger age or be carried out more frequently if you are overweight and have at least 1 risk factor for diabetes.  Colorectal cancer can be detected and often prevented. Most routine colorectal cancer screening begins at the age of 61 and continues through age 25. However, your health care provider may recommend screening at an earlier age if you have risk factors for colon cancer. On a yearly basis, your health care provider may provide home test kits to check for hidden blood in the stool. Use of a small camera at the end of a tube to directly examine the colon (sigmoidoscopy or colonoscopy) can detect the earliest forms of colorectal cancer. Talk to your health care provider about this at age 31, when routine screening begins. Direct exam of the colon should be repeated every 5-10 years through age 39, unless early forms of precancerous polyps or small growths are found.  Hepatitis C blood testing is recommended for all people born from [birth year] through 1964 and any individual with known  risks for hepatitis C.  Screening for abdominal aortic aneurysm (AAA)  by ultrasound is recommended for people who have history of high blood pressure or who are current or former smokers.  Healthy men should  receive prostate-specific antigen (PSA) blood tests as part of routine cancer screening. Talk with your health care provider about prostate cancer screening.  Testicular cancer screening is  recommended for adult males. Screening includes self-exam, a health care provider exam, and other screening tests. Consult with your health care provider about any symptoms you have or any concerns you have about testicular cancer.  Use sunscreen. Apply sunscreen liberally and repeatedly throughout the day. You should seek shade when your shadow is shorter than you. Protect yourself by wearing long sleeves, pants, a wide-brimmed hat, and sunglasses year round, whenever you are outdoors.  Once a month, do a whole-body skin exam, using a mirror to look at the skin on your back. Tell your health care provider about new moles, moles that have irregular borders, moles that are larger than a pencil eraser, or moles that have changed in shape or color.  Stay current with required vaccines (immunizations).  Influenza vaccine. All adults should be immunized every year.  Tetanus, diphtheria, and acellular pertussis (Td, Tdap) vaccine. An adult who has not previously received Tdap or who does not know his vaccine status should receive 1 dose of Tdap. This initial dose should be followed by tetanus and diphtheria toxoids (Td) booster doses every 10 years. Adults with an unknown or incomplete history of completing a 3-dose immunization series with Td-containing vaccines should begin or complete a primary immunization series including a Tdap dose. Adults should receive a Td booster every 10 years.  Zoster vaccine. One dose is recommended for adults aged 48 years or older unless certain conditions are  present.    PREVNAR - Pneumococcal 13-valent conjugate (PCV13) vaccine. When indicated, a person who is uncertain of his immunization history and has no record of immunization should receive the PCV13 vaccine. An adult aged 59 years or older who has certain medical conditions and has not been previously immunized should receive 1 dose of PCV13 vaccine. This PCV13 should be followed with a dose of pneumococcal polysaccharide (PPSV23) vaccine. The PPSV23 vaccine dose should be obtained 1 or more year(s)after the dose of PCV13 vaccine. An adult aged 61 years or older who has certain medical conditions and previously received 1 or more doses of PPSV23 vaccine should receive 1 dose of PCV13. The PCV13 vaccine dose should be obtained 1 or more years after the last PPSV23 vaccine dose.    PNEUMOVAX - Pneumococcal polysaccharide (PPSV23) vaccine. When PCV13 is also indicated, PCV13 should be obtained first. All adults aged 29 years and older should be immunized. An adult younger than age 18 years who has certain medical conditions should be immunized. Any person who resides in a nursing home or long-term care facility should be immunized. An adult smoker should be immunized. People with an immunocompromised condition and certain other conditions should receive both PCV13 and PPSV23 vaccines. People with human immunodeficiency virus (HIV) infection should be immunized as soon as possible after diagnosis. Immunization during chemotherapy or radiation therapy should be avoided. Routine use of PPSV23 vaccine is not recommended for American Indians, [place] Natives, or people younger than 65 years unless there are medical conditions that require PPSV23 vaccine. When indicated, people who have unknown immunization and have no record of immunization should receive PPSV23 vaccine. One-time revaccination 5 years after the first dose of PPSV23 is recommended for people aged 19-64 years who have chronic kidney failure,  nephrotic syndrome, asplenia, or immunocompromised conditions. People who received 1-2 doses of PPSV23 before age 79 years should receive another dose of PPSV23 vaccine at age 68 years or later if at least 5 years have passed since the previous dose. Doses of PPSV23 are not needed for people immunized with PPSV23 at or after age 95 years.    Hepatitis A vaccine. Adults who wish to be protected from this disease, have certain high-risk conditions, work with hepatitis A-infected animals, work in  hepatitis A research labs, or travel to or work in countries with a high rate of hepatitis A should be immunized. Adults who were previously unvaccinated and who anticipate close contact with an international adoptee during the first 60 days after arrival in the [place] from a country with a high rate of hepatitis A should be immunized.    Hepatitis B vaccine. Adults should be immunized if they wish to be protected from this disease, have certain high-risk conditions, may be exposed to blood or other infectious body fluids, are household contacts or sex partners of hepatitis B positive people, are clients or workers in certain care facilities, or travel to or work in countries with a high rate of hepatitis B.   Preventive Service / Frequency   Ages 52 and over  Blood pressure check.  Lipid and cholesterol check.  Lung cancer screening. / Every year if you are aged 58-80 years and have a 30-pack-year history of smoking and currently smoke or have quit within the past 15 years. Yearly screening is stopped once you have quit smoking for at least 15 years or develop a health problem that would prevent you from having lung cancer treatment.  Fecal occult blood test (FOBT) of stool. You may not have to do this test if you get a colonoscopy every 10 years.  Flexible sigmoidoscopy** or colonoscopy.** / Every 5 years for a flexible sigmoidoscopy or every 10 years for a colonoscopy beginning at age 12 and  continuing until age 15.  Hepatitis C blood test.** / For all people born from [birth year] through 1964 and any individual with known risks for hepatitis C.  Abdominal aortic aneurysm (AAA) screening./ Screening current or former smokers or have Hypertension.  Skin self-exam. / Monthly.  Influenza vaccine. / Every year.  Tetanus, diphtheria, and acellular pertussis (Tdap/Td) vaccine.** / 1 dose of Td every 10 years.   Zoster vaccine.** / 1 dose for adults aged 21 years or older.         Pneumococcal 13-valent conjugate (PCV13) vaccine.    Pneumococcal polysaccharide (PPSV23) vaccine.     Hepatitis A vaccine.** / Consult your health care provider.  Hepatitis B vaccine.** / Consult your health care provider. Screening for abdominal aortic aneurysm (AAA)  by ultrasound is recommended for people who have history of high blood pressure or who are current or former smokers. ++++++++++ Recommend Adult Low Dose Aspirin or  coated  Aspirin 81 mg daily  To reduce risk of Colon Cancer 40 %,  Skin Cancer 26 % ,  Malignant Melanoma 46%  and  Pancreatic cancer 60% ++++++++++++++++++++++ Vitamin D goal  is between 70-100.  Please make sure that you are taking your Vitamin D as directed.  It is very important as a natural anti-inflammatory  helping hair, skin, and nails, as well as reducing stroke and heart attack risk.  It helps your bones and helps with mood. It also decreases numerous cancer risks so please take it as directed.  Low Vit D is associated with a 200-300% higher risk for CANCER  and 200-300% higher risk for HEART   ATTACK  &  STROKE.   .....................................Marland Kitchen It is also associated with higher death rate at younger ages,  autoimmune diseases like Rheumatoid arthritis, Lupus, Multiple Sclerosis.    Also many other serious conditions, like depression, Alzheimer's Dementia, infertility, muscle aches, fatigue, fibromyalgia - just to name a  few. ++++++++++++++++++++++ Recommend the book "The END of DIETING" by Dr Excell Seltzer  &  the book "The END of DIABETES " by Dr Excell Seltzer At [email or web address] - get book & Audio CD's    Being diabetic has a  300% increased risk for heart attack, stroke, cancer, and alzheimer- type vascular dementia. It is very important that you work harder with diet by avoiding all foods that are white. Avoid white rice (brown & wild rice is OK), white potatoes (sweetpotatoes in moderation is OK), White bread or wheat bread or anything made out of white flour like bagels, donuts, rolls, buns, biscuits, cakes, pastries, cookies, pizza crust, and pasta (made from white flour & egg whites) - vegetarian pasta or spinach or wheat pasta is OK. Multigrain breads like Arnold's or Pepperidge [employer], or multigrain sandwich thins or flatbreads.  Diet, exercise and weight loss can reverse and cure diabetes in the early stages.  Diet, exercise and weight loss is very important in the control and prevention of complications of diabetes which affects every system in your body, ie. Brain - dementia/stroke, eyes - glaucoma/blindness, heart - heart attack/heart failure, kidneys - dialysis, stomach - gastric paralysis, intestines - malabsorption, nerves - severe painful neuritis, circulation - gangrene & loss of a leg(s), and finally cancer and Alzheimers.    I recommend avoid fried & greasy foods,  sweets/candy, white rice (brown or wild rice or Quinoa is OK), white potatoes (sweet potatoes are OK) - anything made from white flour - bagels, doughnuts, rolls, buns, biscuits,white and wheat breads, pizza crust and traditional pasta made of white flour & egg white(vegetarian pasta or spinach or wheat pasta is OK).  Multi-grain bread is OK - like multi-grain flat bread or sandwich thins. Avoid alcohol in excess. Exercise is also important.    Eat all the vegetables you want - avoid meat, especially red meat and dairy - especially cheese.  Cheese is  the most concentrated form of trans-fats which is the worst thing to clog up our arteries. Veggie cheese is OK which can be found in the fresh produce section at [employer] or [employer] or [employer]  ++++++++++++++++++++++ DASH Eating Plan  DASH stands for "Dietary Approaches to Stop Hypertension."   The DASH eating plan is a healthy eating plan that has been shown to reduce high blood pressure (hypertension). Additional health benefits may include reducing the risk of type 2 diabetes mellitus, heart disease, and stroke. The DASH eating plan may also help with weight loss. WHAT DO I NEED TO KNOW ABOUT THE DASH EATING PLAN? For the DASH eating plan, you will follow these general guidelines:  Choose foods with a percent daily value for sodium of less than 5% (as listed on the food label).  Use salt-free seasonings or herbs instead of table salt or sea salt.  Check with your health care provider or pharmacist before using salt substitutes.  Eat lower-sodium products, often labeled as "lower sodium" or "no salt added."  Eat fresh foods.  Eat more vegetables, fruits, and low-fat dairy products.  Choose whole grains. Look for the word "whole" as the first word in the ingredient list.  Choose fish   Limit sweets, desserts, sugars, and sugary drinks.  Choose heart-healthy fats.  Eat veggie cheese   Eat more home-cooked food and less restaurant, buffet, and fast food.  Limit fried foods.  Cook foods using methods other than frying.  Limit canned vegetables. If you do use them, rinse them well to decrease the sodium.  When eating at a restaurant, ask that your food be prepared with less  salt, or no salt if possible.                      WHAT FOODS CAN I EAT? Read Dr Fara Olden Fuhrman's books on The End of Dieting & The End of Diabetes  Grains Whole grain or whole wheat bread. Brown rice. Whole grain or whole wheat pasta. Quinoa, bulgur, and whole grain cereals. Low-sodium  cereals. Corn or whole wheat flour tortillas. Whole grain cornbread. Whole grain crackers. Low-sodium crackers.  Vegetables Fresh or frozen vegetables (raw, steamed, roasted, or grilled). Low-sodium or reduced-sodium tomato and vegetable juices. Low-sodium or reduced-sodium tomato sauce and paste. Low-sodium or reduced-sodium canned vegetables.   Fruits All fresh, canned (in natural juice), or frozen fruits.  Protein Products  All fish and seafood.  Dried beans, peas, or lentils. Unsalted nuts and seeds. Unsalted canned beans.  Dairy Low-fat dairy products, such as skim or 1% milk, 2% or reduced-fat cheeses, low-fat ricotta or cottage cheese, or plain low-fat yogurt. Low-sodium or reduced-sodium cheeses.  Fats and Oils Tub margarines without trans fats. Light or reduced-fat mayonnaise and salad dressings (reduced sodium). Avocado. Safflower, olive, or canola oils. Natural peanut or almond butter.  Other Unsalted popcorn and pretzels. The items listed above may not be a complete list of recommended foods or beverages. Contact your dietitian for more options.  ++++++++++++++++++++  WHAT FOODS ARE NOT RECOMMENDED? Grains/ White flour or wheat flour White bread. White pasta. White rice. Refined cornbread. Bagels and croissants. Crackers that contain trans fat.  Vegetables  Creamed or fried vegetables. Vegetables in a . Regular canned vegetables. Regular canned tomato sauce and paste. Regular tomato and vegetable juices.  Fruits Dried fruits. Canned fruit in light or heavy syrup. Fruit juice.  Meat and Other Protein Products Meat in general - RED meat & White meat.  Fatty cuts of meat. Ribs, chicken wings, all processed meats as bacon, sausage, bologna, salami, fatback, hot dogs, bratwurst and packaged luncheon meats.  Dairy Whole or 2% milk, cream, half-and-half, and cream cheese. Whole-fat or sweetened yogurt. Full-fat cheeses or blue cheese. Non-dairy creamers and whipped  toppings. Processed cheese, cheese spreads, or cheese curds.  Condiments Onion and garlic salt, seasoned salt, table salt, and sea salt. Canned and packaged gravies. Worcestershire sauce. Tartar sauce. Barbecue sauce. Teriyaki sauce. Soy sauce, including reduced sodium. Steak sauce. Fish sauce. Oyster sauce. Cocktail sauce. Horseradish. Ketchup and mustard. Meat flavorings and tenderizers. Bouillon cubes. Hot sauce. Tabasco sauce. Marinades. Taco seasonings. Relishes.  Fats and Oils Butter, stick margarine, lard, shortening and bacon fat. Coconut, palm kernel, or palm oils. Regular salad dressings.  Pickles and olives. Salted popcorn and pretzels.  The items listed above may not be a complete list of foods and beverages to avoid.

## 2021-01-22 LAB — CBC WITH DIFFERENTIAL/PLATELET
Absolute Monocytes: 655 cells/uL (ref 200–950)
Basophils Absolute: 70 cells/uL (ref 0–200)
Basophils Relative: 1.4 %
Eosinophils Absolute: 230 cells/uL (ref 15–500)
Eosinophils Relative: 4.6 %
HCT: 39.6 % (ref 38.5–50.0)
Hemoglobin: 12.9 g/dL — ABNORMAL LOW (ref 13.2–17.1)
Lymphs Abs: 1375 cells/uL (ref 850–3900)
MCH: 29.7 pg (ref 27.0–33.0)
MCHC: 32.6 g/dL (ref 32.0–36.0)
MCV: 91 fL (ref 80.0–100.0)
MPV: 11.3 fL (ref 7.5–12.5)
Monocytes Relative: 13.1 %
Neutro Abs: 2670 cells/uL (ref 1500–7800)
Neutrophils Relative %: 53.4 %
Platelets: 244 10*3/uL (ref 140–400)
RBC: 4.35 10*6/uL (ref 4.20–5.80)
RDW: 13.1 % (ref 11.0–15.0)
Total Lymphocyte: 27.5 %
WBC: 5 10*3/uL (ref 3.8–10.8)

## 2021-01-22 LAB — COMPLETE METABOLIC PANEL WITH GFR
AG Ratio: 1.9 (calc) (ref 1.0–2.5)
ALT: 63 U/L — ABNORMAL HIGH (ref 9–46)
AST: 60 U/L — ABNORMAL HIGH (ref 10–35)
Albumin: 4.5 g/dL (ref 3.6–5.1)
Alkaline phosphatase (APISO): 65 U/L (ref 35–144)
BUN: 9 mg/dL (ref 7–25)
CO2: 29 mmol/L (ref 20–32)
Calcium: 9.9 mg/dL (ref 8.6–10.3)
Chloride: 101 mmol/L (ref 98–110)
Creat: 1.08 mg/dL (ref 0.70–1.18)
GFR, Est African American: 78 mL/min/{1.73_m2} (ref >=60)
GFR, Est Non African American: 68 mL/min/{1.73_m2} (ref >=60)
Globulin: 2.4 g/dL (calc) (ref 1.9–3.7)
Glucose, Bld: 136 mg/dL — ABNORMAL HIGH (ref 65–99)
Potassium: 4.6 mmol/L (ref 3.5–5.3)
Sodium: 138 mmol/L (ref 135–146)
Total Bilirubin: 0.4 mg/dL (ref 0.2–1.2)
Total Protein: 6.9 g/dL (ref 6.1–8.1)

## 2021-01-22 NOTE — Progress Notes (Signed)
======================================================== ========================================================  -   CBC & Blood Chemistries - All OK Please continue meds  Same  ======================================================== ========================================================

## 2021-03-05 ENCOUNTER — Other Ambulatory Visit: Payer: Self-pay | Admitting: Adult Health

## 2021-03-05 MED ORDER — ATORVASTATIN CALCIUM 10 MG PO TABS: 10.0000 mg | ORAL_TABLET | Freq: Every day | ORAL | 3 refills | Status: DC

## 2021-03-13 DIAGNOSIS — H401131 Primary open-angle glaucoma, bilateral, mild stage: Secondary | ICD-10-CM | POA: Diagnosis not present

## 2021-04-01 ENCOUNTER — Other Ambulatory Visit: Payer: Self-pay | Admitting: *Deleted

## 2021-04-01 ENCOUNTER — Telehealth: Payer: Self-pay | Admitting: Cardiology

## 2021-04-01 ENCOUNTER — Other Ambulatory Visit: Payer: Self-pay

## 2021-04-01 MED ORDER — METOPROLOL SUCCINATE ER 25 MG PO TB24: 12.5000 mg | ORAL_TABLET | Freq: Every day | ORAL | 0 refills | Status: DC

## 2021-04-01 NOTE — Telephone Encounter (Signed)
*  STAT* If patient is at the pharmacy, call can be transferred to refill team.   1. Which medications need to be refilled? (please list name of each medication and dose if known)  metoprolol succinate (TOPROL-XL) 25 MG 24 hr tablet  2. Which pharmacy/location (including street and city if local pharmacy) is medication to be sent to?  [facility]  ([place], [place]  3. Do they need a 30 day or 90 day supply? [place]

## 2021-04-12 ENCOUNTER — Other Ambulatory Visit: Payer: Self-pay | Admitting: Adult Health

## 2021-04-12 DIAGNOSIS — F339 Major depressive disorder, recurrent, unspecified: Secondary | ICD-10-CM

## 2021-04-15 ENCOUNTER — Encounter: Payer: Self-pay | Admitting: Internal Medicine

## 2021-04-15 NOTE — Progress Notes (Signed)
Annual  Screening/Preventative Visit  & Comprehensive Evaluation & Examination  Future Appointments  Date Time Provider [place]  04/16/2021 10:00 AM [name], MD [facility] None  06/17/2021  3:00 PM Jerline Pain, MD [facility]  07/21/2021 11:30 AM Magda Bernheim, NP [facility] None  04/16/2022 10:00 AM [name], MD [facility] None            This very nice 75 y.o. MBM presents for a Screening /Preventative Visit & comprehensive evaluation and management of multiple medical co-morbidities.  Patient has been followed for HTN, HLD, T2_IDDM  and Vitamin D Deficiency.  Abd CT scan in 2015 showed Aortic Atherosclerosis. Patient has GERD controlled on his meds.  Patient has hx/o Prostate Ca treated by XRT in 2018 & followed by active surveillance.                                                      Patient requests referral for Sleep Evaluation. He endorses snoring, reported apnea and daytime  hypersomnolence.        HTN predates since 67. Patient's BP has been controlled at home.  Today's BP is at goal - 126/78.   Patient has ASCAD with PCA & Stent placed in 2013 and is followed by Dr Marlou Porch.  Stress Lexiscan in Feb 2020 was Normal.  Patient denies any cardiac symptoms as chest pain, palpitations, shortness of breath, dizziness or ankle swelling.       Patient's hyperlipidemia is controlled with diet and medications. Patient denies myalgias or other medication SE's. Last lipids were at goal:  Lab Results  Component Value Date   CHOL 125 10/30/2020   HDL 48 10/30/2020   LDLCALC 50 10/30/2020   LDLDIRECT 45.7 10/30/2013   TRIG 197 (H) 10/30/2020   CHOLHDL 2.6 10/30/2020         Patient has hx/o T2_IDDM (1998) w/CKD2 (GFR 78) and  was treated with Metformin til transitioned to Insulin (2016). patient denies reactive hypoglycemic symptoms, visual blurring, diabetic polys or paresthesias. Last A1c was   Lab Results  Component Value Date   HGBA1C 6.7 (H) 10/30/2020           Finally, patient has history of Vitamin D Deficiency ("15" / 2008)    and last vitamin D was borderline elevated high normal :   Lab Results  Component Value Date   VD25OH 106 (H) 10/30/2020     Current Outpatient Medications on File Prior to Visit  Medication Sig   VITAMIN C 100 MG tablet Take 1daily.   aspirin EC 81 MG tablet Take 1 tablet daily.   atorvastatin (LIPITOR) 10 MG tablet Take 1 tablet daily.    WELLBUTRIN XL 300 MG  Take  1 tablet  Daily  for Mood, Focus & Concentration    VITAMIN D 125 MCG 5000 u Take 10,000 Units daily.    docusate sodium 100 MG capsule Take 1 capsule  2  times daily.   DULoxetine 60 MG capsule Take  1 capsule  Daily     furosemide  40 MG tablet Take 1 tablet 2 x /day as needed    gabapentin  800 MG tablet TAKE 1 TABLET 3 TO 4 TIMES DAILY  HUMULIN 70/30 INJECT 50 UNITS INTO SKIN  TWICE DAILY   XALATAN ophth soln Place 1 drop into both eyes at bedtime.   Magnesium 250 MG TABS Take 250 mg by mouth daily.    metFORMIN-XR 500 MG  TAKE 2 TABLETS   TWICE DAILY    metoprolol succinate -XL 25 MG  Take 0.5 tablets daily   montelukast  10 MG tablet TAKE 1 TABLET  DAILY    Multiple Vitamin  Take 1 tablet  daily.   NITROSTAT0.4 MG SL tablet as needed for chest pain.    pantoprazole 40 MG tablet TAKE 1 TABLET DAILY    SYSTANE  SOLN Place 2 drops into both eyes daily as needed    FLOMAX 0.4 MG  Take 0.4 mg by mouth at bedtime.   Zinc 30 MG TABS Take 30 mg by mouth daily.    No Known Allergies   Past Medical History:  Diagnosis Date   Arthritis    CAD (coronary artery disease)    a.s/p DES to mid LAD and OM2 06/2012.   Chronic back pain    Chronically dry eyes    Diabetes mellitus without complication ([place])    TYPE 2    DKA (diabetic ketoacidoses) 01/30/2017   Elevated PSA    being monitored   Fatty liver    GERD (gastroesophageal  reflux disease)    Hx of radiation therapy    prostate , [facility]    Hypertension    Hypertriglyceridemia    Intermediate coronary syndrome ([place]) 07/26/2012   Prostate cancer ([place])    RADTIATION      Health Maintenance  Topic Date Due   COVID-19 Vaccine (1) Never done   Zoster Vaccines- Shingrix (1 of 2) Never done   COLONOSCOPY  02/13/2020   FOOT EXAM  04/10/2021   URINE MICROALBUMIN  04/11/2021   INFLUENZA VACCINE  04/14/2021   HEMOGLOBIN A1C  04/29/2021   OPHTHALMOLOGY EXAM  06/03/2021   TETANUS/TDAP  11/27/2023   Hepatitis C Screening  Completed   PNA vac Low Risk Adult  Completed   HPV VACCINES  Aged Out     Immunization History  Administered Date(s) Administered   Influenza Split 06/26/2012   Influenza, High Dose  06/14/2017, 06/14/2019, 07/01/2020   Influenza 06/14/2013, 06/25/2014, 06/03/2018   Pneumococcal -13 04/23/2016   Pneumococcal -23 06/14/2017   Pneumococcal -23 06/16/2010   Td 09/17/2003   Tdap 11/26/2013   Zoster, Live 07/12/2012    Last Colon -  02/12/2010-  Dr Katheran Awe recc 10 yr f/u due June 2021 (patient aware overdue).   Past Surgical History:  Procedure Laterality Date   APPENDECTOMY     CARDIAC SURGERY  ~2017   3 stent placed.   COLONOSCOPY  02/12/2010   Buccini   LEFT HEART CATHETERIZATION WITH CORONARY ANGIOGRAM N/A 06/27/2012   Procedure: LEFT HEART CATHETERIZATION WITH CORONARY ANGIOGRAM;  Surgeon: Candee Furbish, MD;  Location: [facility] CATH LAB;  Service: Cardiovascular;  Laterality: N/A;   LEFT HEART CATHETERIZATION WITH CORONARY ANGIOGRAM N/A 07/25/2012   Procedure: LEFT HEART CATHETERIZATION WITH CORONARY ANGIOGRAM;  Surgeon: Sueanne Margarita, MD;  Location: [facility] CATH LAB;  Service: Cardiovascular;  Laterality: N/A;   PROSTATE BIOPSY     RADIOLOGY WITH ANESTHESIA N/A 12/03/2015   Procedure: MRI LUMBAR SPINE;  Surgeon: Medication Radiologist, MD;  Location: [place];  Service: Radiology;  Laterality: N/A;   TOTAL HIP  ARTHROPLASTY Right 04/28/2019   Procedure: TOTAL HIP ARTHROPLASTY ANTERIOR APPROACH;  Surgeon: Dorna Leitz, MD;  Location: [facility];  Service: Orthopedics;  Laterality: Right;     Family History  Problem Relation Age of Onset   Diabetes Sister     Social History   Socioeconomic History   Marital status: Married    Spouse name: Not on file   Number of children: 4   Years of education: 2 y colleg   Highest education level: Not on file  Occupational History   Occupation: SUPERVISOR    Employer: [employer]  Tobacco Use   Smoking status: Former    Packs/day: 0.75    Years: 50.00    Pack years: 37.50    Types: Cigarettes    Start date: 67    Quit date: 07/27/2011    Years since quitting: 9.7   Smokeless tobacco: Never  Substance and Sexual Activity   Alcohol use: Not Currently    Comment: qut 20 years ago per patient    Drug use: No   Sexual activity: Not on file  Other Topics Concern   Not on file    ROS Constitutional: Denies fever, chills, weight loss/gain, headaches, insomnia,  night sweats or change in appetite. Does c/o fatigue. Eyes: Denies redness, blurred vision, diplopia, discharge, itchy or watery eyes.  ENT: Denies discharge, congestion, post nasal drip, epistaxis, sore throat, earache, hearing loss, dental pain, Tinnitus, Vertigo, Sinus pain or snoring.  Cardio: Denies chest pain, palpitations, irregular heartbeat, syncope, dyspnea, diaphoresis, orthopnea, PND, claudication or edema Respiratory: denies cough, dyspnea, DOE, pleurisy, hoarseness, laryngitis or wheezing.  Gastrointestinal: Denies dysphagia, heartburn, reflux, water brash, pain, cramps, nausea, vomiting, bloating, diarrhea, constipation, hematemesis, melena, hematochezia, jaundice or hemorrhoids Genitourinary: Denies dysuria, frequency,discharge, hematuria or flank pain. Has urgency, nocturia x 2-3 & occasional hesitancy. Musculoskeletal: Denies arthralgia, myalgia, stiffness, Jt. Swelling,  pain, limp or strain/sprain. Denies Falls. Skin: Denies puritis, rash, hives, warts, acne, eczema or change in skin lesion Neuro: No weakness, tremor, incoordination, spasms, paresthesia or pain Psychiatric: Denies confusion, memory loss or sensory loss. Denies Depression. Endocrine: Denies change in weight, skin, hair change, nocturia, and paresthesia, diabetic polys, visual blurring or hyper / hypo glycemic episodes.  Heme/Lymph: No excessive bleeding, bruising or enlarged lymph nodes.   Physical Exam  BP 126/78   Pulse 78   Temp (!) 97.1 F (36.2 C)   Resp 16   Ht '5\' 9"'$  (1.753 m)   Wt 219 lb (99.3 kg)   SpO2 99%   BMI 32.34 kg/m   General Appearance:  Over nourished and well groomed and in no apparent distress.  Eyes: PERRLA, EOMs, conjunctiva no swelling or erythema, normal fundi and vessels. Sinuses: No frontal/maxillary tenderness ENT/Mouth: EACs patent / TMs  nl. Nares clear without erythema, swelling, mucoid exudates. Oral hygiene is good. No erythema, swelling, or exudate.  Mallampati II. Tonsils not swollen or erythematous. Hearing normal.  Neck: Supple, thyroid not palpable. No bruits, nodes or JVD. Respiratory: Respiratory effort normal.  BS equal and clear bilateral without rales, rhonci, wheezing or stridor. Cardio: Heart sounds are normal with regular rate and rhythm and no murmurs, rubs or gallops. Peripheral pulses are normal and equal bilaterally without edema. No aortic or femoral bruits. Chest: symmetric with normal excursions and percussion.  Abdomen: Soft, with Nl bowel sounds. Nontender, no guarding, rebound, hernias, masses, or organomegaly.  Lymphatics: Non tender without lymphadenopathy.  Musculoskeletal: Full ROM all peripheral extremities, joint stability, 5/5 strength, and normal gait. Skin: Warm and dry without rashes, lesions, cyanosis, clubbing or  ecchymosis.  Neuro: Cranial nerves intact, reflexes equal bilaterally. Normal muscle tone, no cerebellar  symptoms. Sensation intact.  Pysch: Alert and oriented X 3 with normal affect, insight and judgment appropriate.   Assessment and Plan  1. Annual Preventative/Screening Exam    2. Essential hypertension  - EKG 12-Lead - [place], RETROPERITNL ABD,  LTD - Urinalysis, Routine w reflex microscopic - Microalbumin / creatinine urine ratio - CBC with Differential/Platelet - COMPLETE METABOLIC PANEL WITH GFR - Magnesium - TSH  3. Hyperlipidemia associated with type 2 diabetes mellitus ([place])  - EKG 12-Lead - [place], RETROPERITNL ABD,  LTD - Lipid panel - TSH  4. Type 2 diabetes mellitus with stage 2 chronic kidney  disease, with long-term current use of insulin ([facility])  - EKG 12-Lead - [place], RETROPERITNL ABD,  LTD - Urinalysis, Routine w reflex microscopic - Microalbumin / creatinine urine ratio - Hemoglobin A1c - Insulin, random  5. Vitamin D deficiency  - VITAMIN D 25 Hydroxy   6. OSA (obstructive sleep apnea)  - Ambulatory referral to Sleep Studies  7. Insulin-requiring or dependent type II diabetes mellitus ([place])  - HM DIABETES FOOT EXAM - LOW EXTREMITY NEUR EXAM DOCUM  8. Aortic atherosclerosis ([place]) by Abd CT scan 2015  - EKG 12-Lead - [place], RETROPERITNL ABD,  LTD - Insulin, random  9. ASCAD s/p PTCA (06/2012)  - EKG 12-Lead  10. BPH with obstruction/lower urinary tract symptoms  - PSA  11. Gastroesophageal reflux disease  - CBC with Differential/Platelet  12. History of prostate cancer  - PSA  13. Screening for ischemic heart disease  - EKG 12-Lead  14. FHx: heart disease  - EKG 12-Lead - [place], RETROPERITNL ABD,  LTD  15. Screening for colorectal cancer  - EKG 12-Lead - [place], RETROPERITNL ABD,  LTD - POC Hemoccult Bld/Stl   16. Former smoker  - EKG 12-Lead - [place], RETROPERITNL ABD,  LTD  17. Screening for AAA (aortic abdominal aneurysm)  - [place], RETROPERITNL ABD,  LTD  18. Medication management  - Urinalysis, Routine w reflex microscopic -  Microalbumin / creatinine urine ratio - CBC with Differential/Platelet - COMPLETE METABOLIC PANEL WITH GFR - Magnesium - Lipid panel - TSH - Hemoglobin A1c - Insulin, random - VITAMIN D 25 Hydroxy          Patient was counseled in prudent diet, weight control to achieve/maintain BMI less than 25, BP monitoring, regular exercise and medications as discussed.  Discussed med effects and SE's. Routine screening labs and tests as requested with regular follow-up as recommended. Over 40 minutes of exam, counseling, chart review and high complex critical decision making was performed   Kirtland Bouchard, MD

## 2021-04-15 NOTE — Patient Instructions (Signed)
Due to recent changes in healthcare laws, you may see the results of your imaging and laboratory studies on MyChart before your provider has had a chance to review them.  We understand that in some cases there may be results that are confusing or concerning to you. Not all laboratory results come back in the same time frame and the provider may be waiting for multiple results in order to interpret others.  Please give us 48 hours in order for your provider to thoroughly review all the results before contacting the office for clarification of your results.   +++++++++++++++++++++++++++++++  Vit D  & Vit C 1,000 mg   are recommended to help protect  against the Covid-19 and other Corona viruses.    Also it's recommended  to take  Zinc 50 mg  to help  protect against the Covid-19   and best place to get  is also on [employer]  and don't pay more than 6-8 cents /pill !  ================================ Coronavirus (COVID-19) Are you at risk?  Are you at risk for the Coronavirus (COVID-19)?  To be considered HIGH RISK for Coronavirus (COVID-19), you have to meet the following criteria:  Traveled to [place], [place], [place], [place] or [place]; or in the [place] to [place], [place], [place]  or [place]; and have fever, cough, and shortness of breath within the last 2 weeks of travel OR Been in close contact with a person diagnosed with COVID-19 within the last 2 weeks and have  fever, cough,and shortness of breath  IF YOU DO NOT MEET THESE CRITERIA, YOU ARE CONSIDERED LOW RISK FOR COVID-19.  What to do if you are HIGH RISK for COVID-19?  If you are having a medical emergency, call 911. Seek medical care right away. Before you go to a doctor's office, urgent care or emergency department,  call ahead and tell them about your recent travel, contact with someone diagnosed with COVID-19   and your symptoms.  You should receive instructions from your physician's office regarding  next steps of care.  When you arrive at healthcare provider, tell the healthcare staff immediately you have returned from  visiting [place], [place], [place], [place] or [place]; or traveled in the [place] to [place], [place],  [place] or [place] in the last two weeks or you have been in close contact with a person diagnosed with  COVID-19 in the last 2 weeks.   Tell the health care staff about your symptoms: fever, cough and shortness of breath. After you have been seen by a medical provider, you will be either: Tested for (COVID-19) and discharged home on quarantine except to seek medical care if  symptoms worsen, and asked to  Stay home and avoid contact with others until you get your results (4-5 days)  Avoid travel on public transportation if possible (such as bus, train, or airplane) or Sent to the Emergency Department by EMS for evaluation, COVID-19 testing  and  possible admission depending on your condition and test results.  What to do if you are LOW RISK for COVID-19?  Reduce your risk of any infection by using the same precautions used for avoiding the common cold or flu:  Wash your hands often with soap and warm water for at least 20 seconds.  If soap and water are not readily available,  use an alcohol-based hand sanitizer with at least 60% alcohol.  If coughing or sneezing, cover your mouth and nose by coughing   or sneezing into the elbow areas of your shirt or coat,  into a tissue or into your sleeve (not your hands). Avoid shaking hands with others and consider head nods or verbal greetings only. Avoid touching your eyes, nose, or mouth with unwashed hands.  Avoid close contact with people who are sick. Avoid places or events with large numbers of people in one location, like concerts or sporting events. Carefully consider travel plans you have or are making. If you are planning any travel outside or inside the [place], visit the CDC's Travelers' Health webpage for  the latest health notices. If you have some symptoms but not all symptoms, continue to monitor at home and seek medical attention  if your symptoms worsen. If you are having a medical emergency, call 911. >>>>>>>>>>>>>>>>>>>>>>>>>>>>>>>>>>>>>>>>>>>>>>>>>>>>>>> We Do NOT Approve of  [facility] [place] Soliciting Our Patients  To Do Home Visits  & We Do NOT Approve of [employer] > > > > > > > > > > > > > > > > > > > > > > > > > > > > > > > > > > >  > > > >   Preventive Care for Adults  A healthy lifestyle and preventive care can promote health and wellness. Preventive health guidelines for men include the following key practices: A routine yearly physical is a good way to check with your health care provider about your health and preventative screening. It is a chance to share any concerns and updates on your health and to receive a thorough exam. Visit your dentist for a routine exam and preventative care every 6 months. Brush your teeth twice a day and floss once a day. Good oral hygiene prevents tooth decay and gum disease. The frequency of eye exams is based on your age, health, family medical history, use of contact lenses, and other factors. Follow your health care provider's recommendations for frequency of eye exams. Eat a healthy diet. Foods such as vegetables, fruits, whole grains, low-fat dairy products, and lean protein foods contain the nutrients you need without too many calories. Decrease your intake of foods high in solid fats, added sugars, and salt. Eat the right amount of calories for you. Get information about a proper diet from your health care provider, if necessary. Regular physical exercise is one of the most important things you can do for your health. Most adults should get at least 150 minutes of moderate-intensity exercise (any activity that increases your heart rate and causes you to sweat) each week. In addition, most adults need  muscle-strengthening exercises on 2 or more days a week. Maintain a healthy weight. The body mass index (BMI) is a screening tool to identify possible weight problems. It provides an estimate of body fat based on height and weight. Your health care provider can find your BMI and can help you achieve or maintain a healthy weight. For adults 20 years and older: A BMI below 18.5 is considered underweight. A BMI of 18.5 to 24.9 is normal. A BMI of 25 to 29.9 is considered overweight. A BMI of 30 and above is considered obese. Maintain normal blood lipids and cholesterol levels by exercising and minimizing your intake of saturated fat. Eat a balanced diet with plenty of fruit and vegetables. Blood tests for lipids and cholesterol should begin at age 20 and be repeated every 5 years. If your lipid or cholesterol levels are high, you are over 50, or you are at high risk   for heart disease, you may need your cholesterol levels checked more frequently. Ongoing high lipid and cholesterol levels should be treated with medicines if diet and exercise are not working. If you smoke, find out from your health care provider how to quit. If you do not use tobacco, do not start. Lung cancer screening is recommended for adults aged 55-80 years who are at high risk for developing lung cancer because of a history of smoking. A yearly low-dose CT scan of the lungs is recommended for people who have at least a 30-pack-year history of smoking and are a current smoker or have quit within the past 15 years. A pack year of smoking is smoking an average of 1 pack of cigarettes a day for 1 year (for example: 1 pack a day for 30 years or 2 packs a day for 15 years). Yearly screening should continue until the smoker has stopped smoking for at least 15 years. Yearly screening should be stopped for people who develop a health problem that would prevent them from having lung cancer treatment. If you choose to drink alcohol, do not have more  than 2 drinks per day. One drink is considered to be 12 ounces (355 mL) of beer, 5 ounces (148 mL) of wine, or 1.5 ounces (44 mL) of liquor. Avoid use of street drugs. Do not share needles with anyone. Ask for help if you need support or instructions about stopping the use of drugs. High blood pressure causes heart disease and increases the risk of stroke. Your blood pressure should be checked at least every 1-2 years. Ongoing high blood pressure should be treated with medicines, if weight loss and exercise are not effective. If you are 45-79 years old, ask your health care provider if you should take aspirin to prevent heart disease. Diabetes screening involves taking a blood sample to check your fasting blood sugar level. Testing should be considered at a younger age or be carried out more frequently if you are overweight and have at least 1 risk factor for diabetes. Colorectal cancer can be detected and often prevented. Most routine colorectal cancer screening begins at the age of 50 and continues through age 75. However, your health care provider may recommend screening at an earlier age if you have risk factors for colon cancer. On a yearly basis, your health care provider may provide home test kits to check for hidden blood in the stool. Use of a small camera at the end of a tube to directly examine the colon (sigmoidoscopy or colonoscopy) can detect the earliest forms of colorectal cancer. Talk to your health care provider about this at age 50, when routine screening begins. Direct exam of the colon should be repeated every 5-10 years through age 75, unless early forms of precancerous polyps or small growths are found. Hepatitis C blood testing is recommended for all people born from 1945 through 1965 and any individual with known risks for hepatitis C. Screening for abdominal aortic aneurysm (AAA)  by ultrasound is recommended for people who have history of high blood pressure or who are current or  former smokers. Healthy men should  receive prostate-specific antigen (PSA) blood tests as part of routine cancer screening. Talk with your health care provider about prostate cancer screening. Testicular cancer screening is  recommended for adult males. Screening includes self-exam, a health care provider exam, and other screening tests. Consult with your health care provider about any symptoms you have or any concerns you have about testicular cancer.   Use sunscreen. Apply sunscreen liberally and repeatedly throughout the day. You should seek shade when your shadow is shorter than you. Protect yourself by wearing long sleeves, pants, a wide-brimmed hat, and sunglasses year round, whenever you are outdoors. Once a month, do a whole-body skin exam, using a mirror to look at the skin on your back. Tell your health care provider about new moles, moles that have irregular borders, moles that are larger than a pencil eraser, or moles that have changed in shape or color. Stay current with required vaccines (immunizations). Influenza vaccine. All adults should be immunized every year. Tetanus, diphtheria, and acellular pertussis (Td, Tdap) vaccine. An adult who has not previously received Tdap or who does not know his vaccine status should receive 1 dose of Tdap. This initial dose should be followed by tetanus and diphtheria toxoids (Td) booster doses every 10 years. Adults with an unknown or incomplete history of completing a 3-dose immunization series with Td-containing vaccines should begin or complete a primary immunization series including a Tdap dose. Adults should receive a Td booster every 10 years. Zoster vaccine. One dose is recommended for adults aged 60 years or older unless certain conditions are present.  PREVNAR - Pneumococcal 13-valent conjugate (PCV13) vaccine. When indicated, a person who is uncertain of his immunization history and has no record of immunization should receive the PCV13 vaccine.  An adult aged 19 years or older who has certain medical conditions and has not been previously immunized should receive 1 dose of PCV13 vaccine. This PCV13 should be followed with a dose of pneumococcal polysaccharide (PPSV23) vaccine. The PPSV23 vaccine dose should be obtained 1 or more year(s)after the dose of PCV13 vaccine. An adult aged 19 years or older who has certain medical conditions and previously received 1 or more doses of PPSV23 vaccine should receive 1 dose of PCV13. The PCV13 vaccine dose should be obtained 1 or more years after the last PPSV23 vaccine dose.  PNEUMOVAX - Pneumococcal polysaccharide (PPSV23) vaccine. When PCV13 is also indicated, PCV13 should be obtained first. All adults aged 65 years and older should be immunized. An adult younger than age 65 years who has certain medical conditions should be immunized. Any person who resides in a nursing home or long-term care facility should be immunized. An adult smoker should be immunized. People with an immunocompromised condition and certain other conditions should receive both PCV13 and PPSV23 vaccines. People with human immunodeficiency virus (HIV) infection should be immunized as soon as possible after diagnosis. Immunization during chemotherapy or radiation therapy should be avoided. Routine use of PPSV23 vaccine is not recommended for American Indians, [place] Natives, or people younger than 65 years unless there are medical conditions that require PPSV23 vaccine. When indicated, people who have unknown immunization and have no record of immunization should receive PPSV23 vaccine. One-time revaccination 5 years after the first dose of PPSV23 is recommended for people aged 19-64 years who have chronic kidney failure, nephrotic syndrome, asplenia, or immunocompromised conditions. People who received 1-2 doses of PPSV23 before age 65 years should receive another dose of PPSV23 vaccine at age 65 years or later if at least 5 years have passed  since the previous dose. Doses of PPSV23 are not needed for people immunized with PPSV23 at or after age 65 years.  Hepatitis A vaccine. Adults who wish to be protected from this disease, have certain high-risk conditions, work with hepatitis A-infected animals, work in hepatitis A research labs, or travel to or work in countries   with a high rate of hepatitis A should be immunized. Adults who were previously unvaccinated and who anticipate close contact with an international adoptee during the first 60 days after arrival in the [place] from a country with a high rate of hepatitis A should be immunized.  Hepatitis B vaccine. Adults should be immunized if they wish to be protected from this disease, have certain high-risk conditions, may be exposed to blood or other infectious body fluids, are household contacts or sex partners of hepatitis B positive people, are clients or workers in certain care facilities, or travel to or work in countries with a high rate of hepatitis B.  Preventive Service / Frequency  Ages 65 and over Blood pressure check. Lipid and cholesterol check. Lung cancer screening. / Every year if you are aged 55-80 years and have a 30-pack-year history of smoking and currently smoke or have quit within the past 15 years. Yearly screening is stopped once you have quit smoking for at least 15 years or develop a health problem that would prevent you from having lung cancer treatment. Fecal occult blood test (FOBT) of stool. You may not have to do this test if you get a colonoscopy every 10 years. Flexible sigmoidoscopy** or colonoscopy.** / Every 5 years for a flexible sigmoidoscopy or every 10 years for a colonoscopy beginning at age 50 and continuing until age 75. Hepatitis C blood test.** / For all people born from 1945 through 1965 and any individual with known risks for hepatitis C. Abdominal aortic aneurysm (AAA) screening./ Screening current or former smokers or have  Hypertension. Skin self-exam. / Monthly. Influenza vaccine. / Every year. Tetanus, diphtheria, and acellular pertussis (Tdap/Td) vaccine.** / 1 dose of Td every 10 years.  Zoster vaccine.** / 1 dose for adults aged 60 years or older.         Pneumococcal 13-valent conjugate (PCV13) vaccine.   Pneumococcal polysaccharide (PPSV23) vaccine.   Hepatitis A vaccine.** / Consult your health care provider. Hepatitis B vaccine.** / Consult your health care provider. Screening for abdominal aortic aneurysm (AAA)  by ultrasound is recommended for people who have history of high blood pressure or who are current or former smokers. ++++++++++ Recommend Adult Low Dose Aspirin or  coated  Aspirin 81 mg daily  To reduce risk of Colon Cancer 40 %,  Skin Cancer 26 % ,  Malignant Melanoma 46%  and  Pancreatic cancer 60% ++++++++++++++++++++++ Vitamin D goal  is between 70-100.  Please make sure that you are taking your Vitamin D as directed.  It is very important as a natural anti-inflammatory  helping hair, skin, and nails, as well as reducing stroke and heart attack risk.  It helps your bones and helps with mood. It also decreases numerous cancer risks so please take it as directed.  Low Vit D is associated with a 200-300% higher risk for CANCER  and 200-300% higher risk for HEART   ATTACK  &  STROKE.   ...................................... It is also associated with higher death rate at younger ages,  autoimmune diseases like Rheumatoid arthritis, Lupus, Multiple Sclerosis.    Also many other serious conditions, like depression, Alzheimer's Dementia, infertility, muscle aches, fatigue, fibromyalgia - just to name a few. ++++++++++++++++++++++ Recommend the book "The END of DIETING" by Dr Joel Fuhrman  & the book "The END of DIABETES " by Dr Joel Fuhrman At [employer].com - get book & Audio CD's    Being diabetic has a  300% increased risk for   heart attack, stroke, cancer, and alzheimer- type  vascular dementia. It is very important that you work harder with diet by avoiding all foods that are white. Avoid white rice (brown & wild rice is OK), white potatoes (sweetpotatoes in moderation is OK), White bread or wheat bread or anything made out of white flour like bagels, donuts, rolls, buns, biscuits, cakes, pastries, cookies, pizza crust, and pasta (made from white flour & egg whites) - vegetarian pasta or spinach or wheat pasta is OK. Multigrain breads like Arnold's or Pepperidge [employer], or multigrain sandwich thins or flatbreads.  Diet, exercise and weight loss can reverse and cure diabetes in the early stages.  Diet, exercise and weight loss is very important in the control and prevention of complications of diabetes which affects every system in your body, ie. Brain - dementia/stroke, eyes - glaucoma/blindness, heart - heart attack/heart failure, kidneys - dialysis, stomach - gastric paralysis, intestines - malabsorption, nerves - severe painful neuritis, circulation - gangrene & loss of a leg(s), and finally cancer and Alzheimers.    I recommend avoid fried & greasy foods,  sweets/candy, white rice (brown or wild rice or Quinoa is OK), white potatoes (sweet potatoes are OK) - anything made from white flour - bagels, doughnuts, rolls, buns, biscuits,white and wheat breads, pizza crust and traditional pasta made of white flour & egg white(vegetarian pasta or spinach or wheat pasta is OK).  Multi-grain bread is OK - like multi-grain flat bread or sandwich thins. Avoid alcohol in excess. Exercise is also important.    Eat all the vegetables you want - avoid meat, especially red meat and dairy - especially cheese.  Cheese is the most concentrated form of trans-fats which is the worst thing to clog up our arteries. Veggie cheese is OK which can be found in the fresh produce section at [employer] or [employer] or [employer]  ++++++++++++++++++++++ DASH Eating Plan  DASH stands for "Dietary  Approaches to Stop Hypertension."   The DASH eating plan is a healthy eating plan that has been shown to reduce high blood pressure (hypertension). Additional health benefits may include reducing the risk of type 2 diabetes mellitus, heart disease, and stroke. The DASH eating plan may also help with weight loss. WHAT DO I NEED TO KNOW ABOUT THE DASH EATING PLAN? For the DASH eating plan, you will follow these general guidelines: Choose foods with a percent daily value for sodium of less than 5% (as listed on the food label). Use salt-free seasonings or herbs instead of table salt or sea salt. Check with your health care provider or pharmacist before using salt substitutes. Eat lower-sodium products, often labeled as "lower sodium" or "no salt added." Eat fresh foods. Eat more vegetables, fruits, and low-fat dairy products. Choose whole grains. Look for the word "whole" as the first word in the ingredient list. Choose fish  Limit sweets, desserts, sugars, and sugary drinks. Choose heart-healthy fats. Eat veggie cheese  Eat more home-cooked food and less restaurant, buffet, and fast food. Limit fried foods. Cook foods using methods other than frying. Limit canned vegetables. If you do use them, rinse them well to decrease the sodium. When eating at a restaurant, ask that your food be prepared with less salt, or no salt if possible.                      WHAT FOODS CAN I EAT? Read Dr Joel Fuhrman's books on The End of Dieting & The End of   Diabetes  Grains Whole grain or whole wheat bread. Brown rice. Whole grain or whole wheat pasta. Quinoa, bulgur, and whole grain cereals. Low-sodium cereals. Corn or whole wheat flour tortillas. Whole grain cornbread. Whole grain crackers. Low-sodium crackers.  Vegetables Fresh or frozen vegetables (raw, steamed, roasted, or grilled). Low-sodium or reduced-sodium tomato and vegetable juices. Low-sodium or reduced-sodium tomato sauce and paste. Low-sodium or  reduced-sodium canned vegetables.   Fruits All fresh, canned (in natural juice), or frozen fruits.  Protein Products  All fish and seafood.  Dried beans, peas, or lentils. Unsalted nuts and seeds. Unsalted canned beans.  Dairy Low-fat dairy products, such as skim or 1% milk, 2% or reduced-fat cheeses, low-fat ricotta or cottage cheese, or plain low-fat yogurt. Low-sodium or reduced-sodium cheeses.  Fats and Oils Tub margarines without trans fats. Light or reduced-fat mayonnaise and salad dressings (reduced sodium). Avocado. Safflower, olive, or canola oils. Natural peanut or almond butter.  Other Unsalted popcorn and pretzels. The items listed above may not be a complete list of recommended foods or beverages. Contact your dietitian for more options.  ++++++++++++++++++++  WHAT FOODS ARE NOT RECOMMENDED? Grains/ White flour or wheat flour White bread. White pasta. White rice. Refined cornbread. Bagels and croissants. Crackers that contain trans fat.  Vegetables  Creamed or fried vegetables. Vegetables in a . Regular canned vegetables. Regular canned tomato sauce and paste. Regular tomato and vegetable juices.  Fruits Dried fruits. Canned fruit in light or heavy syrup. Fruit juice.  Meat and Other Protein Products Meat in general - RED meat & White meat.  Fatty cuts of meat. Ribs, chicken wings, all processed meats as bacon, sausage, bologna, salami, fatback, hot dogs, bratwurst and packaged luncheon meats.  Dairy Whole or 2% milk, cream, half-and-half, and cream cheese. Whole-fat or sweetened yogurt. Full-fat cheeses or blue cheese. Non-dairy creamers and whipped toppings. Processed cheese, cheese spreads, or cheese curds.  Condiments Onion and garlic salt, seasoned salt, table salt, and sea salt. Canned and packaged gravies. Worcestershire sauce. Tartar sauce. Barbecue sauce. Teriyaki sauce. Soy sauce, including reduced sodium. Steak sauce. Fish sauce. Oyster sauce. Cocktail  sauce. Horseradish. Ketchup and mustard. Meat flavorings and tenderizers. Bouillon cubes. Hot sauce. Tabasco sauce. Marinades. Taco seasonings. Relishes.  Fats and Oils Butter, stick margarine, lard, shortening and bacon fat. Coconut, palm kernel, or palm oils. Regular salad dressings.  Pickles and olives. Salted popcorn and pretzels.  The items listed above may not be a complete list of foods and beverages to avoid.   

## 2021-04-16 ENCOUNTER — Other Ambulatory Visit: Payer: Self-pay

## 2021-04-16 ENCOUNTER — Encounter: Payer: Self-pay | Admitting: Internal Medicine

## 2021-04-16 ENCOUNTER — Ambulatory Visit (INDEPENDENT_AMBULATORY_CARE_PROVIDER_SITE_OTHER): Payer: Medicare Other | Admitting: Internal Medicine

## 2021-04-16 VITALS — BP 126/78 | HR 78 | Temp 97.1°F | Resp 16 | Ht 69.0 in | Wt 219.0 lb

## 2021-04-16 DIAGNOSIS — E1122 Type 2 diabetes mellitus with diabetic chronic kidney disease: Secondary | ICD-10-CM | POA: Diagnosis not present

## 2021-04-16 DIAGNOSIS — Z1211 Encounter for screening for malignant neoplasm of colon: Secondary | ICD-10-CM

## 2021-04-16 DIAGNOSIS — E785 Hyperlipidemia, unspecified: Secondary | ICD-10-CM | POA: Diagnosis not present

## 2021-04-16 DIAGNOSIS — I10 Essential (primary) hypertension: Secondary | ICD-10-CM | POA: Diagnosis not present

## 2021-04-16 DIAGNOSIS — Z87891 Personal history of nicotine dependence: Secondary | ICD-10-CM

## 2021-04-16 DIAGNOSIS — Z79899 Other long term (current) drug therapy: Secondary | ICD-10-CM | POA: Diagnosis not present

## 2021-04-16 DIAGNOSIS — E559 Vitamin D deficiency, unspecified: Secondary | ICD-10-CM

## 2021-04-16 DIAGNOSIS — I700 Atherosclerosis of aorta: Secondary | ICD-10-CM

## 2021-04-16 DIAGNOSIS — E1169 Type 2 diabetes mellitus with other specified complication: Secondary | ICD-10-CM | POA: Diagnosis not present

## 2021-04-16 DIAGNOSIS — Z8249 Family history of ischemic heart disease and other diseases of the circulatory system: Secondary | ICD-10-CM | POA: Diagnosis not present

## 2021-04-16 DIAGNOSIS — Z136 Encounter for screening for cardiovascular disorders: Secondary | ICD-10-CM | POA: Diagnosis not present

## 2021-04-16 DIAGNOSIS — G4733 Obstructive sleep apnea (adult) (pediatric): Secondary | ICD-10-CM

## 2021-04-16 DIAGNOSIS — Z0000 Encounter for general adult medical examination without abnormal findings: Secondary | ICD-10-CM | POA: Diagnosis not present

## 2021-04-16 DIAGNOSIS — I2510 Atherosclerotic heart disease of native coronary artery without angina pectoris: Secondary | ICD-10-CM

## 2021-04-16 DIAGNOSIS — Z794 Long term (current) use of insulin: Secondary | ICD-10-CM

## 2021-04-16 DIAGNOSIS — Z9861 Coronary angioplasty status: Secondary | ICD-10-CM

## 2021-04-16 DIAGNOSIS — K219 Gastro-esophageal reflux disease without esophagitis: Secondary | ICD-10-CM

## 2021-04-16 DIAGNOSIS — N138 Other obstructive and reflux uropathy: Secondary | ICD-10-CM

## 2021-04-16 DIAGNOSIS — Z8546 Personal history of malignant neoplasm of prostate: Secondary | ICD-10-CM

## 2021-04-16 DIAGNOSIS — Z0001 Encounter for general adult medical examination with abnormal findings: Secondary | ICD-10-CM

## 2021-04-16 DIAGNOSIS — N182 Chronic kidney disease, stage 2 (mild): Secondary | ICD-10-CM | POA: Diagnosis not present

## 2021-04-16 DIAGNOSIS — E119 Type 2 diabetes mellitus without complications: Secondary | ICD-10-CM

## 2021-04-17 LAB — URINALYSIS, ROUTINE W REFLEX MICROSCOPIC
Bilirubin Urine: NEGATIVE
Hgb urine dipstick: NEGATIVE
Ketones, ur: NEGATIVE
Leukocytes,Ua: NEGATIVE
Nitrite: NEGATIVE
Protein, ur: NEGATIVE
Specific Gravity, Urine: 1.017 (ref 1.001–1.035)
pH: 5.5 (ref 5.0–8.0)

## 2021-04-17 LAB — CBC WITH DIFFERENTIAL/PLATELET
Absolute Monocytes: 568 cells/uL (ref 200–950)
Basophils Absolute: 40 cells/uL (ref 0–200)
Basophils Relative: 0.9 %
Eosinophils Absolute: 128 cells/uL (ref 15–500)
Eosinophils Relative: 2.9 %
HCT: 39.8 % (ref 38.5–50.0)
Hemoglobin: 13.3 g/dL (ref 13.2–17.1)
Lymphs Abs: 1241 cells/uL (ref 850–3900)
MCH: 29.7 pg (ref 27.0–33.0)
MCHC: 33.4 g/dL (ref 32.0–36.0)
MCV: 88.8 fL (ref 80.0–100.0)
MPV: 11.4 fL (ref 7.5–12.5)
Monocytes Relative: 12.9 %
Neutro Abs: 2424 cells/uL (ref 1500–7800)
Neutrophils Relative %: 55.1 %
Platelets: 187 10*3/uL (ref 140–400)
RBC: 4.48 10*6/uL (ref 4.20–5.80)
RDW: 12.1 % (ref 11.0–15.0)
Total Lymphocyte: 28.2 %
WBC: 4.4 10*3/uL (ref 3.8–10.8)

## 2021-04-17 LAB — PSA: PSA: <0.04 ng/mL (ref <=4.00)

## 2021-04-17 LAB — INSULIN, RANDOM: Insulin: 43.5 u[IU]/mL — ABNORMAL HIGH

## 2021-04-17 LAB — COMPLETE METABOLIC PANEL WITH GFR
AG Ratio: 1.8 (calc) (ref 1.0–2.5)
ALT: 65 U/L — ABNORMAL HIGH (ref 9–46)
AST: 61 U/L — ABNORMAL HIGH (ref 10–35)
Albumin: 4.5 g/dL (ref 3.6–5.1)
Alkaline phosphatase (APISO): 78 U/L (ref 35–144)
BUN: 12 mg/dL (ref 7–25)
CO2: 29 mmol/L (ref 20–32)
Calcium: 9.8 mg/dL (ref 8.6–10.3)
Chloride: 99 mmol/L (ref 98–110)
Creat: 0.96 mg/dL (ref 0.70–1.28)
Globulin: 2.5 g/dL (calc) (ref 1.9–3.7)
Glucose, Bld: 198 mg/dL — ABNORMAL HIGH (ref 65–99)
Potassium: 4.4 mmol/L (ref 3.5–5.3)
Sodium: 136 mmol/L (ref 135–146)
Total Bilirubin: 0.4 mg/dL (ref 0.2–1.2)
Total Protein: 7 g/dL (ref 6.1–8.1)
eGFR: 83 mL/min/{1.73_m2} (ref >=60)

## 2021-04-17 LAB — HEMOGLOBIN A1C
Hgb A1c MFr Bld: 8.3 % of total Hgb — ABNORMAL HIGH (ref <5.7)
Mean Plasma Glucose: 192 mg/dL
eAG (mmol/L): 10.6 mmol/L

## 2021-04-17 LAB — LIPID PANEL
Cholesterol: 114 mg/dL (ref <200)
HDL: 43 mg/dL (ref >=40)
LDL Cholesterol (Calc): 47 mg/dL (calc)
Non-HDL Cholesterol (Calc): 71 mg/dL (calc) (ref <130)
Total CHOL/HDL Ratio: 2.7 (calc) (ref <5.0)
Triglycerides: 159 mg/dL — ABNORMAL HIGH (ref <150)

## 2021-04-17 LAB — TSH: TSH: 0.86 mIU/L (ref 0.40–4.50)

## 2021-04-17 LAB — VITAMIN D 25 HYDROXY (VIT D DEFICIENCY, FRACTURES): Vit D, 25-Hydroxy: 113 ng/mL — ABNORMAL HIGH (ref 30–100)

## 2021-04-17 LAB — MAGNESIUM: Magnesium: 1.9 mg/dL (ref 1.5–2.5)

## 2021-04-17 LAB — MICROALBUMIN / CREATININE URINE RATIO
Creatinine, Urine: 101 mg/dL (ref 20–320)
Microalb Creat Ratio: 10 mcg/mg creat (ref <30)
Microalb, Ur: 1 mg/dL

## 2021-04-19 NOTE — Progress Notes (Signed)
============================================================ ============================================================  -  Vitamin D is borderline elevated at 113  - Recommend cut down to alternate 10,000 units every other day  -   EVEN DAYS &                                                             5,000 units every other day   -   ODD DAYS  ============================================================ ============================================================  -  PSA is Un detectable - Great ! ============================================================ ============================================================  -  Glucose is elevated at 198 mg% - way too high and   A1c - Worse - has gone up from 6.7% to Now  A1c = 8.3%  - way too high   You must work harder on your diet    - Being diabetic has a  300% increased risk for                            heart attack,                                          stroke,                                               Cancer &                                                        Alzheimer- type vascular dementia.  ============================================================ ============================================================  -  liver enzymes are still elevated  - most likely due to Fatty Liver   Fatty liver or Nonalcoholic fatty liver disease (NASH) is now the leading cause of liver failure in the [place]. It is normally from such risk factors as obesity, diabetes, insulin resistance, high cholesterol, or metabolic syndrome. The only definitive therapy is weight loss and exercise.  Suggest walking 20-30 mins daily.  Decreasing carbohydrates, increasing veggies.  Vitamin E 800 IU a day may be beneficial.  Liver cancer has been noted in patient with fatty liver without cirrhosis.  Will monitor closely   Fatty Liver   Fatty liver is the accumulation of fat in liver cells. It is also called   Hepatosteatosis or  steatohepatitis.   It is normal for your liver to contain some fat. If fat is more than 5 to 10% of your liver's weight, you have fatty liver.  There are often no symptoms (problems) for years while damage is still occurring. People often learn about their fatty liver when they have medical tests for other reasons. Fat can damage your liver for years or even decades without causing problems. When it becomes severe, it can cause fatigue, weight loss, weakness, and confusion.  This makes you more likely to develop more serious liver problems. The liver is the largest organ in the body. It does a lot of work and often gives no warning  signs when it is sick until late in a disease.  The liver has many important jobs including:  Breaking down foods.  Storing vitamins, iron, and other minerals.  Making proteins.  Making bile for food digestion.  Breaking down many products including medications, alcohol and some poisons.   PROGNOSIS  Fatty liver may cause no damage or it can lead to an inflammation of the liver. This is, called steatohepatitis. Over time the liver may become scarred and hardened. This condition is called cirrhosis. Cirrhosis is serious and may lead to liver failure or cancer. NASH is one of the leading causes of cirrhosis. About 10-20% of Americans have fatty liver and a smaller 2-5% has NASH.   TREATMENT  Weight loss, fat restriction, and exercise in overweight patients produces inconsistent results but is worth trying.  Good control of diabetes may reduce fatty liver.  Eat a balanced, healthy diet.  Increase your physical activity.  There are no medical or surgical treatments for a fatty liver or NASH, but improving your diet and increasing your exercise may help prevent or reverse some of the damage.   - The Only treatment is Avoidance of Alcohol     &     Weight  Loss ============================================================ ============================================================  -  Total Chol = 114    &   LDL Chol = 47        -       Both  Excellent   - Very low risk for Heart Attack  / Stroke ============================================================ ============================================================  -  All Else - CBC - Kidneys - Electrolytes - Liver - Magnesium & Thyroid    - all  Normal / OK ============================================================   ============================================================                                       It is very important that you work harder with diet by  avoiding all foods that are white except chicken,   fish & calliflower.  - Avoid white rice  (brown & wild rice is OK),   - Avoid white potatoes  (sweet potatoes in moderation is OK),   White bread or wheat bread or anything made out of   white flour like bagels, donuts, rolls, buns, biscuits, cakes,  - pastries, cookies, pizza crust, and pasta (made from  white flour & egg whites)   - vegetarian pasta or spinach or wheat pasta is OK.  - Multigrain breads like Arnold's, Pepperidge [employer] or   multigrain sandwich thins or high fiber breads like   [place] bread or "Dave's Killer" breads that are  4 to 5 grams fiber per slice !  are best.    Diet, exercise and weight loss can reverse and cure  diabetes in the early stages.    - Diet, exercise and weight loss is very important in the   control and prevention of complications of diabetes which  affects every system in your body, ie.   -Brain - dementia/stroke,  - eyes - glaucoma/blindness,  - heart - heart attack/heart failure,  - kidneys - dialysis,  - stomach - gastric paralysis,  - intestines - malabsorption,  - nerves - severe painful neuritis,  - circulation - gangrene & loss of a leg(s)  - and finally  . . .  . . . . . . . . . . . . . . Marland Kitchen    -  cancer and Alzheimers.

## 2021-05-15 ENCOUNTER — Other Ambulatory Visit: Payer: Self-pay | Admitting: Internal Medicine

## 2021-05-15 DIAGNOSIS — R61 Generalized hyperhidrosis: Secondary | ICD-10-CM

## 2021-05-15 MED ORDER — GLYCOPYRROLATE 2 MG PO TABS: ORAL_TABLET | ORAL | 0 refills | Status: DC

## 2021-05-28 ENCOUNTER — Encounter: Payer: Self-pay | Admitting: Nurse Practitioner

## 2021-05-28 ENCOUNTER — Other Ambulatory Visit: Payer: Self-pay

## 2021-05-28 ENCOUNTER — Ambulatory Visit (INDEPENDENT_AMBULATORY_CARE_PROVIDER_SITE_OTHER): Payer: Medicare Other | Admitting: Nurse Practitioner

## 2021-05-28 VITALS — BP 126/70 | HR 81 | Temp 97.1°F | Wt 217.8 lb

## 2021-05-28 DIAGNOSIS — R1319 Other dysphagia: Secondary | ICD-10-CM | POA: Diagnosis not present

## 2021-05-28 DIAGNOSIS — Z1152 Encounter for screening for COVID-19: Secondary | ICD-10-CM | POA: Diagnosis not present

## 2021-05-28 DIAGNOSIS — R5383 Other fatigue: Secondary | ICD-10-CM

## 2021-05-28 LAB — COMPLETE METABOLIC PANEL WITH GFR
AG Ratio: 1.8 (calc) (ref 1.0–2.5)
ALT: 69 U/L — ABNORMAL HIGH (ref 9–46)
AST: 69 U/L — ABNORMAL HIGH (ref 10–35)
Albumin: 4.6 g/dL (ref 3.6–5.1)
Alkaline phosphatase (APISO): 77 U/L (ref 35–144)
BUN: 11 mg/dL (ref 7–25)
CO2: 28 mmol/L (ref 20–32)
Calcium: 10.3 mg/dL (ref 8.6–10.3)
Chloride: 97 mmol/L — ABNORMAL LOW (ref 98–110)
Creat: 1.06 mg/dL (ref 0.70–1.28)
Globulin: 2.5 g/dL (calc) (ref 1.9–3.7)
Glucose, Bld: 83 mg/dL (ref 65–99)
Potassium: 4.4 mmol/L (ref 3.5–5.3)
Sodium: 133 mmol/L — ABNORMAL LOW (ref 135–146)
Total Bilirubin: 0.4 mg/dL (ref 0.2–1.2)
Total Protein: 7.1 g/dL (ref 6.1–8.1)
eGFR: 74 mL/min/{1.73_m2} (ref >=60)

## 2021-05-28 LAB — CBC WITH DIFFERENTIAL/PLATELET
Absolute Monocytes: 745 cells/uL (ref 200–950)
Basophils Absolute: 50 cells/uL (ref 0–200)
Basophils Relative: 1 %
Eosinophils Absolute: 150 cells/uL (ref 15–500)
Eosinophils Relative: 3 %
HCT: 40.9 % (ref 38.5–50.0)
Hemoglobin: 13.6 g/dL (ref 13.2–17.1)
Lymphs Abs: 1540 cells/uL (ref 850–3900)
MCH: 28.9 pg (ref 27.0–33.0)
MCHC: 33.3 g/dL (ref 32.0–36.0)
MCV: 86.8 fL (ref 80.0–100.0)
MPV: 11.5 fL (ref 7.5–12.5)
Monocytes Relative: 14.9 %
Neutro Abs: 2515 cells/uL (ref 1500–7800)
Neutrophils Relative %: 50.3 %
Platelets: 216 10*3/uL (ref 140–400)
RBC: 4.71 10*6/uL (ref 4.20–5.80)
RDW: 12.7 % (ref 11.0–15.0)
Total Lymphocyte: 30.8 %
WBC: 5 10*3/uL (ref 3.8–10.8)

## 2021-05-28 LAB — POC COVID19 BINAXNOW: SARS Coronavirus 2 Ag: NEGATIVE

## 2021-05-28 NOTE — Progress Notes (Signed)
Assessment and Plan: Michael Guzman was seen today for sore throat and nasal congestion.  Diagnoses and all orders for this visit:  Encounter for screening for COVID-19 -     POC COVID-19  Esophageal dysphagia -     Ambulatory referral to Gastroenterology - Continue Protonix - Given soft diet plan - If symptoms worsen he is to go to the ER  Fatigue, unspecified type -     CBC with Differential/Platelet -     COMPLETE METABOLIC PANEL WITH GFR      Further disposition pending results of labs. Discussed med's effects and SE's.   Over 30 minutes of exam, counseling, chart review, and critical decision making was performed.   Future Appointments  Date Time Provider [place]  06/17/2021  3:00 PM Jerline Pain, MD [facility]  06/30/2021  3:00 PM Dohmeier, Asencion Partridge, MD [facility] None  07/21/2021 11:30 AM Magda Bernheim, NP [facility] None  10/17/2021 11:00 AM [name], MD [facility] None  04/16/2022 10:00 AM [name], MD [facility] None    ------------------------------------------------------------------------------------------------------------------   HPI BP 126/70   Pulse 81   Temp (!) 97.1 F (36.2 C)   Wt 217 lb 12.8 oz (98.8 kg)   SpO2 95%   BMI 32.16 kg/m  74 y.o.male presents for esophageal spasm  Approximately 1 week ago started having a sore throat, extreme difficulty swallowing.  Feels like food and pills get stuck, occasionally will have to regurgitate.  Having associated increased fatigue.    BMI is Body mass index is 32.16 kg/m., he has not been working on diet and exercise. Wt Readings from Last 3 Encounters:  05/28/21 217 lb 12.8 oz (98.8 kg)  04/16/21 219 lb (99.3 kg)  01/21/21 223 lb 12.8 oz (101.5 kg)    Past Medical History:  Diagnosis Date   Arthritis    CAD (coronary artery disease)    a.s/p DES to mid LAD and OM2 06/2012.   Chronic back pain    Chronically dry eyes    Diabetes mellitus without complication (HCC)    TYPE 2     DKA (diabetic ketoacidoses) 01/30/2017   Elevated PSA    being monitored   Fatty liver    GERD (gastroesophageal reflux disease)    Hx of radiation therapy    prostate , [facility] [place]    Hypertension    Hypertriglyceridemia    Intermediate coronary syndrome ([place]) 07/26/2012   Prostate cancer ([place])    RADTIATION      No Known Allergies  Current Outpatient Medications on File Prior to Visit  Medication Sig   Ascorbic Acid (VITAMIN C) 100 MG tablet Take 100 mg by mouth daily.   aspirin EC 81 MG tablet Take 1 tablet (81 mg total) by mouth daily.   atorvastatin (LIPITOR) 10 MG tablet Take 1 tablet (10 mg total) by mouth daily. Take 1 tablet (10 mg total) by mouth daily.   Blood Glucose Monitoring Suppl (ONE TOUCH ULTRA SYSTEM KIT) w/Device KIT Check blood sugar 1 time daily-[id number]   buPROPion (WELLBUTRIN XL) 300 MG 24 hr tablet Take  1 tablet  Daily  for Mood, Focus & Concentration / Patient knows to take by mouth   Cholecalciferol (VITAMIN D3) 125 MCG (5000 UT) CAPS Take 10,000 Units by mouth daily.    docusate sodium (COLACE) 100 MG capsule Take 1 capsule (100 mg total) by mouth 2 (two) times daily.   DULoxetine (CYMBALTA) 60 MG capsule Take  1 capsule  Daily  for Mood & Chronic Pain   furosemide (LASIX) 40 MG tablet Take 1 tablet 2 x /day as needed  for Fluid Retention   gabapentin (NEURONTIN) 800 MG tablet TAKE 1 TABLET BY MOUTH 3 TO 4 TIMES DAILY FOR PAIN, HOT FLASHES AND SWEATS (Patient taking differently: Take 1,600 mg by mouth in the morning and at bedtime.)   glucose blood (ONETOUCH ULTRA) test strip CHECK BLOOD SUGAR 3 TIMES  DAILY (Dx: e11.29)   glycopyrrolate (ROBINUL) 2 MG tablet Take 1 tablet 3 x /day for excessive sweating   insulin NPH-regular Human (HUMULIN 70/30) (70-30) 100 UNIT/ML injection INJECT 50 UNITS INTO SKIN  TWICE DAILY (Patient taking differently: Inject 50 Units into the skin 2 (two) times daily with a meal.)   Insulin Syringes, Disposable, U-100  1 ML MISC 50 Units by Does not apply route 2 (two) times daily. inject 50 units into skin 2 x /day   Lancets (ONETOUCH DELICA PLUS [id number]) MISC CHECK BLOOD SUGAR 3 TIMES  DAILY   latanoprost (XALATAN) 0.005 % ophthalmic solution Place 1 drop into both eyes at bedtime.   Magnesium 250 MG TABS Take 250 mg by mouth daily.    metFORMIN (GLUCOPHAGE-XR) 500 MG 24 hr tablet TAKE 2 TABLETS BY MOUTH  TWICE DAILY WITH MEALS FOR  DIABETES   metoprolol succinate (TOPROL-XL) 25 MG 24 hr tablet Take 0.5 tablets (12.5 mg total) by mouth daily. Pt needs to make appt with provider for further refills - 2nd attempt   montelukast (SINGULAIR) 10 MG tablet TAKE 1 TABLET BY MOUTH  DAILY FOR ALLERGIES   Multiple Vitamin (MULTIVITAMIN WITH MINERALS) TABS tablet Take 1 tablet by mouth daily.   nitroGLYCERIN (NITROSTAT) 0.4 MG SL tablet PLACE 1 TABLET UNDER THE TONGUE EVERY 5 MINUTES AS NEEDED FOR CHEST PAIN (Patient taking differently: Place 0.4 mg under the tongue every 5 (five) minutes as needed for chest pain. PLACE 1 TABLET UNDER THE TONGUE EVERY 5 MINUTES AS NEEDED FOR CHEST PAIN)   pantoprazole (PROTONIX) 40 MG tablet TAKE 1 TABLET BY MOUTH  DAILY TO PREVENT HEARTBURN  AND INDIGESTION   Polyethyl Glycol-Propyl Glycol (SYSTANE) 0.4-0.3 % SOLN Place 2 drops into both eyes daily as needed (for dry eyes).   tamsulosin (FLOMAX) 0.4 MG CAPS capsule Take 0.4 mg by mouth at bedtime.   Vitamin E 400 units TABS Take by mouth. Taking 800 IU a day   Zinc 30 MG TABS Take 30 mg by mouth daily.   No current facility-administered medications on file prior to visit.   Review of Systems  Constitutional:  Positive for malaise/fatigue and weight loss. Negative for chills and fever.  HENT:  Positive for sore throat. Negative for congestion and sinus pain.   Respiratory:  Positive for cough. Negative for shortness of breath and wheezing.   Cardiovascular:  Positive for leg swelling. Negative for chest pain, palpitations and  orthopnea.  Gastrointestinal:  Positive for heartburn. Negative for abdominal pain, constipation, diarrhea, nausea and vomiting.       Dysphagia  Genitourinary:  Positive for dysuria.  Musculoskeletal:  Positive for back pain and joint pain.  Neurological:  Negative for tingling and headaches.  Psychiatric/Behavioral:  Positive for depression.     Physical Exam:  BP 126/70   Pulse 81   Temp (!) 97.1 F (36.2 C)   Wt 217 lb 12.8 oz (98.8 kg)   SpO2 95%   BMI 32.16 kg/m   General Appearance: Well nourished, appears weak and fatigued Eyes: PERRLA,  EOMs, conjunctiva no swelling or erythema Sinuses: No Frontal/maxillary tenderness ENT/Mouth: Ext aud canals clear, TMs without erythema, bulging. No erythema, swelling, or exudate on post pharynx.  Tonsils not swollen or erythematous. Hearing normal.  Neck: Supple, thyroid normal.  Respiratory: Respiratory effort normal, BS equal bilaterally without rales, rhonchi, wheezing or stridor.  Cardio: RRR with no MRGs. Brisk peripheral pulses without edema.  Abdomen: Soft, + BS.  Non tender, no guarding, rebound, hernias, masses. Lymphatics: Non tender without lymphadenopathy.  Musculoskeletal: Full ROM, 5/5 strength Skin: Warm, dry without rashes, lesions, ecchymosis.  Neuro: Cranial nerves intact. Normal muscle tone, no cerebellar symptoms. Sensation intact.  Psych: Awake and oriented X 3, normal affect, Insight and Judgment appropriate.     Magda Bernheim, NP 2:54 PM [facility]

## 2021-05-28 NOTE — Patient Instructions (Signed)
Dysphagia Eating Plan, Bite Size Food This eating plan is for people with moderate swallowing problems who have transitioned from pureed and minced foods. Bite size foods are soft and cut into small chunks so that they can be swallowed safely. On this eating plan, you may be instructed to drink liquids that are thickened. Work with your health care provider and your diet and nutrition specialist (dietitian) to make sure that you are following the eating plan safely and getting all the nutrients you need. What are tips for following this plan? General information  You may eat foods that are tender, soft, and moist. Always test food texture before taking a bite. Poke food with a fork or spoon to make sure it is tender. The test sample should squash, break apart, or change shape, and it should not return to its original shape when the fork or spoon is removed. Food should be easy to cut and chew. Avoid large pieces of food that require a lot of chewing. Take small bites. Each bite should be smaller than your thumb nail (about 15 mm by 15 mm). If you were on a pureed or minced food eating plan, you may eat any of the foods included in those diets. Avoid foods that are very dry, hard, sticky, chewy, coarse, or crunchy. If instructed by your health care provider, thicken liquids. Follow your health care provider's instructions for what products to use, how to do this, and to what thickness. Your health care provider may recommend using a commercial thickener, rice cereal, or potato flakes. Ask your health care provider to recommend thickeners. Thickened liquids are usually a "pudding-like" consistency, or they may be as thick as honey or thick enough to eat with a spoon. Cooking To moisten foods, you may add liquids while you are blending, mashing, or grinding your foods to the right consistency. These liquids include gravies, sauces, vegetable or fruit juice, milk, half and half, or water. Strain extra  liquid from foods before eating. Reheat foods slowly to prevent a tough crust from forming. Prepare foods in advance. Meal planning Eat a variety of foods to get all the nutrients you need. Some foods may be tolerated better than others. Work with your dietitian to identify which foods are safest for you to eat. Follow your meal plan as told by your dietitian. What foods can I eat? Grains Moist breads without nuts or seeds. Biscuits, muffins, pancakes, and waffles that are well-moistened with syrup, jelly, margarine, or butter. Cooked cereals. Moist bread stuffing. Moist rice. Well-moistened cold cereal with small chunks. Well-cooked pasta, noodles, rice, and bread dressing in small pieces and thick sauce. Soft dumplings or spaetzle in small pieces and butter or gravy. Fruits Canned or cooked fruits that are soft or moist and do not have skin or seeds. Fresh, soft bananas. Thickened fruit juices. Vegetables Soft, well-cooked vegetables in small pieces. Soft-cooked, mashed potatoes. Thickened vegetable juice. Meats and other proteins Tender, moist meats or poultry in small pieces. Moist meatballs or meatloaf. Fish without bones. Eggs or egg substitutes in small pieces. Tofu. Tempeh and meat alternatives in small pieces. Well-cooked, tender beans, peas, baked beans, and other legumes. Dairy Thickened milk. Cream cheese. Yogurt. Cottage cheese. Sour cream. Small pieces of soft cheese. Fats and oils Butter. Oils. Margarine. Mayonnaise. Gravy. Spreads. Sweets and desserts Soft, smooth, moist desserts. Pudding. Custard. Moist cakes. Jam. Jelly. Honey. Preserves. Ask your health care provider whether you can have frozen desserts. Seasonings and other foods All seasonings and  sweeteners. All sauces with small chunks. Prepared tuna, egg, or chicken salad without raw fruits or vegetables. Moist casseroles with small, tender pieces of meat. Soups with tender meat. The items listed above may not be a  complete list of foods and beverages you can eat. Contact a dietitian for more information. What foods must I avoid? Grains Coarse or dry cereals. Dry breads. Toast. Crackers. Tough, crusty breads, such as [place] bread and baguettes. Dry pancakes, waffles, and muffins. Sticky rice. Dry bread stuffing. Granola. Popcorn. Chips. Fruits Hard, crunchy, stringy, high-pulp, and juicy raw fruits such as apples, pineapple, papaya, and watermelon. Small, round fruits, such as grapes. Dried fruit and fruit leather. Vegetables All raw vegetables. Cooked corn. Rubbery or stiff cooked vegetables. Stringy vegetables, such as celery. Tough, crisp fried potatoes. Potato skins. Meats and other proteins Large pieces of meat. Dry, tough meats, such as bacon, sausage, and hot dogs. Chicken, [place], or fish with skin and bones. Crunchy peanut butter. Nuts. Seeds. Nut and seed butters. Dairy Yogurt with nuts, seeds, or large chunks. Large chunks of cheese. Sweets and desserts Dry cakes. Chewy or dry cookies. Any desserts with nuts, seeds, dry fruits, coconut, pineapple, or anything dry, sticky, or hard. Chewy caramel. Licorice. Taffy-type candies. Ask your health care provider whether you can have frozen desserts. Seasonings and other foods Soups with tough or large chunks of meats, poultry, or vegetables. Corn or clam chowder. Smoothies with large chunks of fruit. The items listed above may not be a complete list of foods and beverages you should avoid. Contact a dietitian for more information. Summary Bite size foods can be helpful for people with moderate swallowing problems. On this dysphagia eating plan, you may eat foods that are soft, moist, and cut into pieces smaller than your thumb nail (about 15 mm by 15 mm). You may be instructed to thicken liquids. Follow your health care provider's instructions about how to do this and to what consistency. This information is not intended to replace advice given to you  by your health care provider. Make sure you discuss any questions you have with your health care provider. Document Revised: 05/20/2020 Document Reviewed: 05/20/2020 Elsevier Patient Education  [place].

## 2021-06-03 DIAGNOSIS — R1310 Dysphagia, unspecified: Secondary | ICD-10-CM | POA: Diagnosis not present

## 2021-06-03 DIAGNOSIS — K219 Gastro-esophageal reflux disease without esophagitis: Secondary | ICD-10-CM | POA: Diagnosis not present

## 2021-06-06 DIAGNOSIS — K319 Disease of stomach and duodenum, unspecified: Secondary | ICD-10-CM | POA: Diagnosis not present

## 2021-06-06 DIAGNOSIS — R1310 Dysphagia, unspecified: Secondary | ICD-10-CM | POA: Diagnosis not present

## 2021-06-06 DIAGNOSIS — K3189 Other diseases of stomach and duodenum: Secondary | ICD-10-CM | POA: Diagnosis not present

## 2021-06-06 DIAGNOSIS — K2970 Gastritis, unspecified, without bleeding: Secondary | ICD-10-CM | POA: Diagnosis not present

## 2021-06-11 DIAGNOSIS — K319 Disease of stomach and duodenum, unspecified: Secondary | ICD-10-CM | POA: Diagnosis not present

## 2021-06-17 ENCOUNTER — Other Ambulatory Visit: Payer: Self-pay

## 2021-06-17 ENCOUNTER — Encounter (HOSPITAL_BASED_OUTPATIENT_CLINIC_OR_DEPARTMENT_OTHER): Payer: Self-pay | Admitting: Cardiology

## 2021-06-17 ENCOUNTER — Ambulatory Visit (HOSPITAL_BASED_OUTPATIENT_CLINIC_OR_DEPARTMENT_OTHER): Payer: Medicare Other | Admitting: Cardiology

## 2021-06-17 DIAGNOSIS — I10 Essential (primary) hypertension: Secondary | ICD-10-CM

## 2021-06-17 DIAGNOSIS — E119 Type 2 diabetes mellitus without complications: Secondary | ICD-10-CM | POA: Insufficient documentation

## 2021-06-17 DIAGNOSIS — Z9861 Coronary angioplasty status: Secondary | ICD-10-CM

## 2021-06-17 DIAGNOSIS — I2510 Atherosclerotic heart disease of native coronary artery without angina pectoris: Secondary | ICD-10-CM | POA: Diagnosis not present

## 2021-06-17 DIAGNOSIS — H25813 Combined forms of age-related cataract, bilateral: Secondary | ICD-10-CM | POA: Diagnosis not present

## 2021-06-17 DIAGNOSIS — I700 Atherosclerosis of aorta: Secondary | ICD-10-CM | POA: Diagnosis not present

## 2021-06-17 DIAGNOSIS — C61 Malignant neoplasm of prostate: Secondary | ICD-10-CM

## 2021-06-17 DIAGNOSIS — E1136 Type 2 diabetes mellitus with diabetic cataract: Secondary | ICD-10-CM | POA: Diagnosis not present

## 2021-06-17 DIAGNOSIS — Z794 Long term (current) use of insulin: Secondary | ICD-10-CM | POA: Diagnosis not present

## 2021-06-17 DIAGNOSIS — H524 Presbyopia: Secondary | ICD-10-CM | POA: Diagnosis not present

## 2021-06-17 DIAGNOSIS — H401131 Primary open-angle glaucoma, bilateral, mild stage: Secondary | ICD-10-CM | POA: Diagnosis not present

## 2021-06-17 DIAGNOSIS — Z7984 Long term (current) use of oral hypoglycemic drugs: Secondary | ICD-10-CM | POA: Diagnosis not present

## 2021-06-17 LAB — HM DIABETES EYE EXAM

## 2021-06-17 NOTE — Assessment & Plan Note (Signed)
Doing well stent was placed in 2013 to the LAD.  Continue with aspirin 81 mg.  He is on statin as well.  LDL is at goal less than 70.  No anginal symptoms.  Continue with current medical management

## 2021-06-17 NOTE — Patient Instructions (Signed)
Medication Instructions:  The current medical regimen is effective;  continue present plan and medications.  *If you need a refill on your cardiac medications before your next appointment, please call your pharmacy*  Follow-Up: At [facility], you and your health needs are our priority.  As part of our continuing mission to provide you with exceptional heart care, we have created designated Provider Care Teams.  These Care Teams include your primary Cardiologist (physician) and Advanced Practice Providers (APPs -  Physician Assistants and Nurse Practitioners) who all work together to provide you with the care you need, when you need it.  We recommend signing up for the patient portal called "MyChart".  Sign up information is provided on this After Visit Summary.  MyChart is used to connect with patients for Virtual Visits (Telemedicine).  Patients are able to view lab/test results, encounter notes, upcoming appointments, etc.  Non-urgent messages can be sent to your provider as well.   To learn more about what you can do with MyChart, go to [email or web address].    Your next appointment:   1 year(s)  The format for your next appointment:   In Person  Provider:   Mark Skains, MD   Thank you for choosing [facility]!!    

## 2021-06-17 NOTE — Assessment & Plan Note (Signed)
Hemoglobin A1c elevated from prior of 6.9 currently 8.3.  Working on this.  Dr. Melford Aase watching closely.

## 2021-06-17 NOTE — Assessment & Plan Note (Signed)
Continue with statin, aspirin.  Excellent.

## 2021-06-17 NOTE — Progress Notes (Signed)
Cardiology Office Note:    Date:  06/17/2021   ID:  Michael Guzman, DOB [birth year], MRN [id number]  PCP:  [name], MD  Cardiologist:  Candee Furbish, MD  Electrophysiologist:  None   Referring MD: [name], MD    History of Present Illness:    Michael Guzman is a 75 y.o. male here for follow-up of coronary artery disease and hypertension.  Coronary disease status post DES to mid LAD and obtuse marginal in 2013 diabetes with hypertension high triglycerides arthritis recent hip surgery Dr. Berenice Primas  At his last appointment, overall been doing quite well.  Still had hip pain however.  Has been reassured.  Walking with a cane.  No anginal symptoms.  Taking his medications.  See below.  Today: Overall, he has been feeling decent. However, he notes having issues with diaphoresis.  If he strains and tries to pick up heavy objects he will notice some chest pain.  For activity he continues to try to walk regularly.  He denies any palpitations, or shortness of breath. No lightheadedness, headaches, syncope, orthopnea, or PND. Also has no lower extremity edema or exertional symptoms.  Past Medical History:  Diagnosis Date   Arthritis    CAD (coronary artery disease)    a.s/p DES to mid LAD and OM2 06/2012.   Chronic back pain    Chronically dry eyes    Diabetes mellitus without complication (HCC)    TYPE 2    DKA (diabetic ketoacidoses) 01/30/2017   Elevated PSA    being monitored   Fatty liver    GERD (gastroesophageal reflux disease)    Hx of radiation therapy    prostate , [facility]    Hypertension    Hypertriglyceridemia    Intermediate coronary syndrome ([place]) 07/26/2012   Prostate cancer ([place])    RADTIATION     Past Surgical History:  Procedure Laterality Date   APPENDECTOMY     CARDIAC SURGERY  ~2017   3 stent placed.   COLONOSCOPY  02/12/2010   [facility]   LEFT HEART CATHETERIZATION WITH CORONARY ANGIOGRAM N/A 06/27/2012    Procedure: LEFT HEART CATHETERIZATION WITH CORONARY ANGIOGRAM;  Surgeon: Candee Furbish, MD;  Location: [facility] CATH LAB;  Service: Cardiovascular;  Laterality: N/A;   LEFT HEART CATHETERIZATION WITH CORONARY ANGIOGRAM N/A 07/25/2012   Procedure: LEFT HEART CATHETERIZATION WITH CORONARY ANGIOGRAM;  Surgeon: Sueanne Margarita, MD;  Location: [facility] CATH LAB;  Service: Cardiovascular;  Laterality: N/A;   PROSTATE BIOPSY     RADIOLOGY WITH ANESTHESIA N/A 12/03/2015   Procedure: MRI LUMBAR SPINE;  Surgeon: Medication Radiologist, MD;  Location: [facility];  Service: Radiology;  Laterality: N/A;   TOTAL HIP ARTHROPLASTY Right 04/28/2019   Procedure: TOTAL HIP ARTHROPLASTY ANTERIOR APPROACH;  Surgeon: Dorna Leitz, MD;  Location: [facility];  Service: Orthopedics;  Laterality: Right;    Current Medications: Current Meds  Medication Sig   Ascorbic Acid (VITAMIN C) 100 MG tablet Take 100 mg by mouth daily.   aspirin EC 81 MG tablet Take 1 tablet (81 mg total) by mouth daily.   atorvastatin (LIPITOR) 10 MG tablet Take 1 tablet (10 mg total) by mouth daily. Take 1 tablet (10 mg total) by mouth daily.   Blood Glucose Monitoring Suppl (ONE TOUCH ULTRA SYSTEM KIT) w/Device KIT Check blood sugar 1 time daily-[id number]   buPROPion (WELLBUTRIN XL) 300 MG 24 hr tablet Take  1 tablet  Daily  for Mood, Focus & Concentration / Patient knows to take  by mouth   Cholecalciferol (VITAMIN D3) 125 MCG (5000 UT) CAPS Take 10,000 Units by mouth daily.    docusate sodium (COLACE) 100 MG capsule Take 1 capsule (100 mg total) by mouth 2 (two) times daily.   DULoxetine (CYMBALTA) 60 MG capsule Take  1 capsule  Daily  for Mood & Chronic Pain   furosemide (LASIX) 40 MG tablet Take 1 tablet 2 x /day as needed  for Fluid Retention   gabapentin (NEURONTIN) 800 MG tablet TAKE 1 TABLET BY MOUTH 3 TO 4 TIMES DAILY FOR PAIN, HOT FLASHES AND SWEATS   glucose blood (ONETOUCH ULTRA) test strip CHECK BLOOD SUGAR 3 TIMES  DAILY (Dx: e11.29)   glycopyrrolate  (ROBINUL) 2 MG tablet Take 1 tablet 3 x /day for excessive sweating   insulin NPH-regular Human (HUMULIN 70/30) (70-30) 100 UNIT/ML injection INJECT 50 UNITS INTO SKIN  TWICE DAILY (Patient taking differently: INJECT 50 UNITS INTO SKIN  TWICE DAILY)   Insulin Syringes, Disposable, U-100 1 ML MISC 50 Units by Does not apply route 2 (two) times daily. inject 50 units into skin 2 x /day   Lancets (ONETOUCH DELICA PLUS [id number]) MISC CHECK BLOOD SUGAR 3 TIMES  DAILY   latanoprost (XALATAN) 0.005 % ophthalmic solution Place 1 drop into both eyes at bedtime.   Magnesium 250 MG TABS Take 250 mg by mouth daily.    metFORMIN (GLUCOPHAGE-XR) 500 MG 24 hr tablet TAKE 2 TABLETS BY MOUTH  TWICE DAILY WITH MEALS FOR  DIABETES   metoprolol succinate (TOPROL-XL) 25 MG 24 hr tablet Take 0.5 tablets (12.5 mg total) by mouth daily. Pt needs to make appt with provider for further refills - 2nd attempt   montelukast (SINGULAIR) 10 MG tablet TAKE 1 TABLET BY MOUTH  DAILY FOR ALLERGIES   Multiple Vitamin (MULTIVITAMIN WITH MINERALS) TABS tablet Take 1 tablet by mouth daily.   nitroGLYCERIN (NITROSTAT) 0.4 MG SL tablet PLACE 1 TABLET UNDER THE TONGUE EVERY 5 MINUTES AS NEEDED FOR CHEST PAIN   pantoprazole (PROTONIX) 40 MG tablet TAKE 1 TABLET BY MOUTH  DAILY TO PREVENT HEARTBURN  AND INDIGESTION   Polyethyl Glycol-Propyl Glycol (SYSTANE) 0.4-0.3 % SOLN Place 2 drops into both eyes daily as needed (for dry eyes).   tamsulosin (FLOMAX) 0.4 MG CAPS capsule Take 0.4 mg by mouth at bedtime.   Vitamin E 400 units TABS Take by mouth. Taking 800 IU a day   Zinc 30 MG TABS Take 30 mg by mouth daily.     Allergies:   Patient has no known allergies.   Social History   Socioeconomic History   Marital status: Married    Spouse name: Not on file   Number of children: 4   Years of education: 2 y colleg   Highest education level: Not on file  Occupational History   Occupation: SUPERVISOR    Employer: [employer]   Tobacco Use   Smoking status: Former    Packs/day: 0.75    Years: 50.00    Pack years: 37.50    Types: Cigarettes    Start date: 60    Quit date: 07/27/2011    Years since quitting: 9.8   Smokeless tobacco: Never  Substance and Sexual Activity   Alcohol use: Not Currently    Comment: qut 20 years ago per patient    Drug use: No   Sexual activity: Not on file  Other Topics Concern   Not on file  Social History Narrative   Works as  Social Determinants of Health   Financial Resource Strain: Not on file  Food Insecurity: Not on file  Transportation Needs: Not on file  Physical Activity: Not on file  Stress: Not on file  Social Connections: Not on file     Family History: The patient's family history includes Diabetes in his sister.  ROS:   Please see the history of present illness. (+) Chest pain (+) Diaphoresis All other systems are reviewed and negative.    EKGs/Labs/Other Studies Reviewed:    The following studies were reviewed today:  Lexiscan Myoview 11/02/2018: Nuclear stress EF: 55%. No T wave inversion was noted during stress. There was no ST segment deviation noted during stress. The study is normal. This is a low risk study.   Normal perfusion. LVEF 55% with normal wall motion. This is a low risk study. Compared with a prior study in 2017, there is no longer ischemia.  Echo 02/02/2018: - Left ventricle: The cavity size was normal. There was mild    concentric hypertrophy. Systolic function was normal. The    estimated ejection fraction was in the range of 60% to 65%. Wall    motion was normal; there were no regional wall motion    abnormalities. Doppler parameters are consistent with abnormal    left ventricular relaxation (grade 1 diastolic dysfunction).    There was no evidence of elevated ventricular filling pressure by    Doppler parameters.  - Aortic valve: Valve area (VTI): 3.87 cm^2. Valve area (Vmax):    3.61 cm^2. Valve area (Vmean):  3.59 cm^2.  - Mitral valve: There was mild regurgitation. Valve area by    pressure half-time: 2.44 cm^2.  - Left atrium: The atrium was normal in size.  - Right ventricle: Systolic function was normal.  - Tricuspid valve: There was mild regurgitation.  - Pulmonary arteries: Systolic pressure was within the normal    range.  - Inferior vena cava: The vessel was normal in size.  - Pericardium, extracardiac: There was no pericardial effusion.   EKG:   EKG is personally reviewed and interpreted. 06/17/2021: EKG was not ordered today. 10/27/2019: sinus rhythm 70 no other changes.  Recent Labs: 04/16/2021: Magnesium 1.9; TSH 0.86 05/28/2021: ALT 69; BUN 11; Creat 1.06; Hemoglobin 13.6; Platelets 216; Potassium 4.4; Sodium 133  Recent Lipid Panel    Component Value Date/Time   CHOL 114 04/16/2021 1012   TRIG 159 (H) 04/16/2021 1012   HDL 43 04/16/2021 1012   CHOLHDL 2.7 04/16/2021 1012   VLDL 59 (H) 01/15/2017 1118   LDLCALC 47 04/16/2021 1012   LDLDIRECT 45.7 10/30/2013 0743    Physical Exam:     VS:  BP 122/70   Pulse 91   Ht _0  (1.778 m)   Wt 214 lb (97.1 kg)   SpO2 98%   BMI 30.71 kg/m     Wt Readings from Last 3 Encounters:  06/17/21 214 lb (97.1 kg)  05/28/21 217 lb 12.8 oz (98.8 kg)  04/16/21 219 lb (99.3 kg)     GEN: Well nourished, well developed in no acute distress, utilizing a cane for ambulation HEENT: Normal NECK: No JVD; No carotid bruits LYMPHATICS: No lymphadenopathy CARDIAC: RRR, no murmurs, rubs, gallops RESPIRATORY:  Clear to auscultation without rales, wheezing or rhonchi  ABDOMEN: Soft, non-tender, non-distended MUSCULOSKELETAL:  No edema; No deformity  SKIN: Warm and dry NEUROLOGIC:  Alert and oriented x 3 PSYCHIATRIC:  Normal affect    ASSESSMENT:    1. ASCAD s/p  PTCA (06/2012)   2. Aortic atherosclerosis ([place]) by Abd CT scan 2015   3. Diabetes mellitus with coincident hypertension ([place])   4. Malignant neoplasm of prostate [facility])      PLAN:    In order of problems listed above: ASCAD s/p PTCA (06/2012) Doing well stent was placed in 2013 to the LAD.  Continue with aspirin 81 mg.  He is on statin as well.  LDL is at goal less than 70.  No anginal symptoms.  Continue with current medical management  Aortic atherosclerosis ([place]) by Abd CT scan 2015 Continue with statin, aspirin.  Excellent.  Diabetes mellitus with coincident hypertension (HCC) Hemoglobin A1c elevated from prior of 6.9 currently 8.3.  Working on this.  Dr. Melford Aase watching closely.  Malignant neoplasm of prostate ([place]) States that Lupron injections have caused hyperhidrosis.  This has been challenging for him.   Follow-up: 1 year.  Medication Adjustments/Labs and Tests Ordered: Current medicines are reviewed at length with the patient today.  Concerns regarding medicines are outlined above.   No orders of the defined types were placed in this encounter.  No orders of the defined types were placed in this encounter.   Patient Instructions  Medication Instructions:  The current medical regimen is effective;  continue present plan and medications.  *If you need a refill on your cardiac medications before your next appointment, please call your pharmacy*  Follow-Up: At [facility], you and your health needs are our priority.  As part of our continuing mission to provide you with exceptional heart care, we have created designated Provider Care Teams.  These Care Teams include your primary Cardiologist (physician) and Advanced Practice Providers (APPs -  Physician Assistants and Nurse Practitioners) who all work together to provide you with the care you need, when you need it.  We recommend signing up for the patient portal called "MyChart".  Sign up information is provided on this After Visit Summary.  MyChart is used to connect with patients for Virtual Visits (Telemedicine).  Patients are able to view lab/test results, encounter notes,  upcoming appointments, etc.  Non-urgent messages can be sent to your provider as well.   To learn more about what you can do with MyChart, go to [email or web address].    Your next appointment:   1 year(s)  The format for your next appointment:   In Person  Provider:   Candee Furbish, MD   Thank you for choosing [facility]!!     I,Mathew Stumpf,acting as a scribe for Candee Furbish, MD.,have documented all relevant documentation on the behalf of Candee Furbish, MD,as directed by  Candee Furbish, MD while in the presence of Candee Furbish, MD.  I, Candee Furbish, MD, have reviewed all documentation for this visit. The documentation on 06/17/21 for the exam, diagnosis, procedures, and orders are all accurate and complete.   Signed, Candee Furbish, MD  06/17/2021 3:26 PM    [place]

## 2021-06-17 NOTE — Assessment & Plan Note (Signed)
States that Lupron injections have caused hyperhidrosis.  This has been challenging for him.

## 2021-06-23 ENCOUNTER — Other Ambulatory Visit: Payer: Self-pay | Admitting: Internal Medicine

## 2021-06-23 ENCOUNTER — Other Ambulatory Visit (HOSPITAL_BASED_OUTPATIENT_CLINIC_OR_DEPARTMENT_OTHER): Payer: Self-pay | Admitting: Cardiology

## 2021-06-23 DIAGNOSIS — R61 Generalized hyperhidrosis: Secondary | ICD-10-CM

## 2021-06-23 DIAGNOSIS — G894 Chronic pain syndrome: Secondary | ICD-10-CM

## 2021-06-25 ENCOUNTER — Encounter: Payer: Self-pay | Admitting: Internal Medicine

## 2021-06-30 ENCOUNTER — Encounter: Payer: Self-pay | Admitting: Neurology

## 2021-06-30 ENCOUNTER — Ambulatory Visit: Payer: Medicare Other | Admitting: Neurology

## 2021-06-30 VITALS — BP 139/82 | HR 94 | Ht 70.0 in | Wt 210.0 lb

## 2021-06-30 DIAGNOSIS — G4719 Other hypersomnia: Secondary | ICD-10-CM

## 2021-06-30 DIAGNOSIS — G4710 Hypersomnia, unspecified: Secondary | ICD-10-CM

## 2021-06-30 DIAGNOSIS — G4730 Sleep apnea, unspecified: Secondary | ICD-10-CM

## 2021-06-30 DIAGNOSIS — R0683 Snoring: Secondary | ICD-10-CM

## 2021-06-30 DIAGNOSIS — R61 Generalized hyperhidrosis: Secondary | ICD-10-CM | POA: Diagnosis not present

## 2021-06-30 DIAGNOSIS — Z72821 Inadequate sleep hygiene: Secondary | ICD-10-CM | POA: Diagnosis not present

## 2021-06-30 DIAGNOSIS — R5383 Other fatigue: Secondary | ICD-10-CM

## 2021-06-30 DIAGNOSIS — G4762 Sleep related leg cramps: Secondary | ICD-10-CM

## 2021-06-30 DIAGNOSIS — E1142 Type 2 diabetes mellitus with diabetic polyneuropathy: Secondary | ICD-10-CM

## 2021-06-30 NOTE — Patient Instructions (Signed)
Screening for Sleep Apnea Sleep apnea is a condition in which breathing pauses or becomes shallow during sleep. Sleep apnea screening is a test to determine if you are at risk for sleep apnea. The test includes a series of questions. It will only takes a few minutes. Your health care provider may ask you to have this test in preparation for surgery or as part of a physical exam. What are the symptoms of sleep apnea? Common symptoms of sleep apnea include: Snoring. Waking up often at night. Daytime sleepiness. Pauses in breathing. Choking or gasping during sleep. Irritability. Forgetfulness. Trouble thinking clearly. Depression. Personality changes. Most people with sleep apnea do not know that they have it. What are the advantages of sleep apnea screening? Getting screened for sleep apnea can help: Ensure your safety. It is important for your health care providers to know whether or not you have sleep apnea, especially if you are having surgery or have other long-term (chronic) health conditions. Improve your health and allow you to get a better night's rest. Restful sleep can help you: Have more energy. Lose weight. Improve high blood pressure. Improve diabetes management. Prevent stroke. Prevent car accidents. What happens during the screening? Screening usually includes being asked a list of questions about your sleep quality. Some questions you may be asked include: Do you snore? Is your sleep restless? Do you have daytime sleepiness? Has a partner or spouse told you that you stop breathing during sleep? Have you had trouble concentrating or memory loss? What is your age? What is your neck circumference? To measure your neck, keep your back straight and gently wrap the tape measure around your neck. Put the tape measure at the middle of your neck, between your chin and collarbone. What is your sex assigned at birth? Do you have or are you being treated for high blood  pressure? If your screening test is positive, you are at risk for the condition. Further testing may be needed to confirm a diagnosis of sleep apnea. Where to find more information You can find screening tools online or at your health care clinic. For more information about sleep apnea screening and healthy sleep, visit these websites: Centers for Disease Control and Prevention: http://www.wolf.info/ American Sleep Apnea Association: www.sleepapnea.org Contact a health care provider if: You think that you may have sleep apnea. Summary Sleep apnea screening can help determine if you are at risk for sleep apnea. It is important for your health care providers to know whether or not you have sleep apnea, especially if you are having surgery or have other chronic health conditions. You may be asked to take a screening test for sleep apnea in preparation for surgery or as part of a physical exam. This information is not intended to replace advice given to you by your health care provider. Make sure you discuss any questions you have with your health care provider. Document Revised: 08/09/2020 Document Reviewed: 08/09/2020 Elsevier Patient Education  2022 [place] Sleep Information, Adult Quality sleep is important for your mental and physical health. It also improves your quality of life. Quality sleep means you: Are asleep for most of the time you are in bed. Fall asleep within 30 minutes. Wake up no more than once a night.  Are awake for no longer than 20 minutes if you do wake up during the night. Most adults need 7-8 hours of quality sleep each night. How can poor sleep affect me? If you do not get enough quality sleep,  you may have: Mood swings. Daytime sleepiness. Confusion. Decreased reaction time. Sleep disorders, such as insomnia and sleep apnea. Difficulty with: Solving problems. Coping with stress. Paying attention. These issues may affect your performance and productivity  at work, school, and at home. Lack of sleep may also put you at higher risk for accidents, suicide, and risky behaviors. If you do not get quality sleep you may also be at higher risk for several health problems, including: Infections. Type 2 diabetes. Heart disease. High blood pressure. Obesity. Worsening of long-term conditions, like arthritis, kidney disease, depression, Parkinson's disease, and epilepsy. What actions can I take to get more quality sleep?   Stick to a sleep schedule. Go to sleep and wake up at about the same time each day. Do not try to sleep less on weekdays and make up for lost sleep on weekends. This does not work. Try to get about 30 minutes of exercise on most days. Do not exercise 2-3 hours before going to bed. Limit naps during the day to 30 minutes or less. Do not use any products that contain nicotine or tobacco, such as cigarettes or e-cigarettes. If you need help quitting, ask your health care provider. Do not drink caffeinated beverages for at least 8 hours before going to bed. Coffee, tea, and some sodas contain caffeine. Do not drink alcohol close to bedtime. Do not eat large meals close to bedtime. Do not take naps in the late afternoon. Try to get at least 30 minutes of sunlight every day. Morning sunlight is best. Make time to relax before bed. Reading, listening to music, or taking a hot bath promotes quality sleep. Make your bedroom a place that promotes quality sleep. Keep your bedroom dark, quiet, and at a comfortable room temperature. Make sure your bed is comfortable. Take out sleep distractions like TV, a computer, smartphone, and bright lights. If you are lying awake in bed for longer than 20 minutes, get up and do a relaxing activity until you feel sleepy. Work with your health care provider to treat medical conditions that may affect sleeping, such as: Nasal obstruction. Snoring. Sleep apnea and other sleep disorders. Talk to your health care  provider if you think any of your prescription medicines may cause you to have difficulty falling or staying asleep. If you have sleep problems, talk with a sleep consultant. If you think you have a sleep disorder, talk with your health care provider about getting evaluated by a specialist. Where to find more information [place] website: https://sleepfoundation.org National Heart, Lung, and [place] ([place]): http://www.saunders.info/.pdf Centers for Disease Control and Prevention (CDC): LearningDermatology.pl Contact a health care provider if you: Have trouble getting to sleep or staying asleep. Often wake up very early in the morning and cannot get back to sleep. Have daytime sleepiness. Have daytime sleep attacks of suddenly falling asleep and sudden muscle weakness (narcolepsy). Have a tingling sensation in your legs with a strong urge to move your legs (restless legs syndrome). Stop breathing briefly during sleep (sleep apnea). Think you have a sleep disorder or are taking a medicine that is affecting your quality of sleep. Summary Most adults need 7-8 hours of quality sleep each night. Getting enough quality sleep is an important part of health and well-being. Make your bedroom a place that promotes quality sleep and avoid things that may cause you to have poor sleep, such as alcohol, caffeine, smoking, and large meals. Talk to your health care provider if you have trouble falling  asleep or staying asleep. This information is not intended to replace advice given to you by your health care provider. Make sure you discuss any questions you have with your health care provider. Document Revised: 12/08/2017 Document Reviewed: 12/08/2017 Elsevier Patient Education  2022 [place]. Insomnia Insomnia is a sleep disorder that makes it difficult to fall asleep or stay asleep. Insomnia can cause fatigue, low energy, difficulty  concentrating, mood swings, and poor performance at work or school. There are three different ways to classify insomnia: Difficulty falling asleep. Difficulty staying asleep. Waking up too early in the morning. Any type of insomnia can be long-term (chronic) or short-term (acute). Both are common. Short-term insomnia usually lasts for three months or less. Chronic insomnia occurs at least three times a week for longer than three months. What are the causes? Insomnia may be caused by another condition, situation, or substance, such as: Anxiety. Certain medicines. Gastroesophageal reflux disease (GERD) or other gastrointestinal conditions. Asthma or other breathing conditions. Restless legs syndrome, sleep apnea, or other sleep disorders. Chronic pain. Menopause. Stroke. Abuse of alcohol, tobacco, or illegal drugs. Mental health conditions, such as depression. Caffeine. Neurological disorders, such as Alzheimer's disease. An overactive thyroid (hyperthyroidism). Sometimes, the cause of insomnia may not be known. What increases the risk? Risk factors for insomnia include: Gender. Women are affected more often than men. Age. Insomnia is more common as you get older. Stress. Lack of exercise. Irregular work schedule or working night shifts. Traveling between different time zones. Certain medical and mental health conditions. What are the signs or symptoms? If you have insomnia, the main symptom is having trouble falling asleep or having trouble staying asleep. This may lead to other symptoms, such as: Feeling fatigued or having low energy. Feeling nervous about going to sleep. Not feeling rested in the morning. Having trouble concentrating. Feeling irritable, anxious, or depressed. How is this diagnosed? This condition may be diagnosed based on: Your symptoms and medical history. Your health care provider may ask about: Your sleep habits. Any medical conditions you have. Your  mental health. A physical exam. How is this treated? Treatment for insomnia depends on the cause. Treatment may focus on treating an underlying condition that is causing insomnia. Treatment may also include: Medicines to help you sleep. Counseling or therapy. Lifestyle adjustments to help you sleep better. Follow these instructions at home: Eating and drinking  Limit or avoid alcohol, caffeinated beverages, and cigarettes, especially close to bedtime. These can disrupt your sleep. Do not eat a large meal or eat spicy foods right before bedtime. This can lead to digestive discomfort that can make it hard for you to sleep. Sleep habits  Keep a sleep diary to help you and your health care provider figure out what could be causing your insomnia. Write down: When you sleep. When you wake up during the night. How well you sleep. How rested you feel the next day. Any side effects of medicines you are taking. What you eat and drink. Make your bedroom a dark, comfortable place where it is easy to fall asleep. Put up shades or blackout curtains to block light from outside. Use a white noise machine to block noise. Keep the temperature cool. Limit screen use before bedtime. This includes: Watching TV. Using your smartphone, tablet, or computer. Stick to a routine that includes going to bed and waking up at the same times every day and night. This can help you fall asleep faster. Consider making a quiet activity, such as  reading, part of your nighttime routine. Try to avoid taking naps during the day so that you sleep better at night. Get out of bed if you are still awake after 15 minutes of trying to sleep. Keep the lights down, but try reading or doing a quiet activity. When you feel sleepy, go back to bed. General instructions Take over-the-counter and prescription medicines only as told by your health care provider. Exercise regularly, as told by your health care provider. Avoid exercise  starting several hours before bedtime. Use relaxation techniques to manage stress. Ask your health care provider to suggest some techniques that may work well for you. These may include: Breathing exercises. Routines to release muscle tension. Visualizing peaceful scenes. Make sure that you drive carefully. Avoid driving if you feel very sleepy. Keep all follow-up visits as told by your health care provider. This is important. Contact a health care provider if: You are tired throughout the day. You have trouble in your daily routine due to sleepiness. You continue to have sleep problems, or your sleep problems get worse. Get help right away if: You have serious thoughts about hurting yourself or someone else. If you ever feel like you may hurt yourself or others, or have thoughts about taking your own life, get help right away. You can go to your nearest emergency department or call: Your local emergency services (911 in the [place] A suicide crisis helpline, such as the [facility] at 385-690-6365. This is open 24 hours a day. Summary Insomnia is a sleep disorder that makes it difficult to fall asleep or stay asleep. Insomnia can be long-term (chronic) or short-term (acute). Treatment for insomnia depends on the cause. Treatment may focus on treating an underlying condition that is causing insomnia. Keep a sleep diary to help you and your health care provider figure out what could be causing your insomnia. This information is not intended to replace advice given to you by your health care provider. Make sure you discuss any questions you have with your health care provider. Document Revised: 07/11/2020 Document Reviewed: 07/11/2020 Elsevier Patient Education  2022 [employer].

## 2021-06-30 NOTE — Progress Notes (Signed)
SLEEP MEDICINE CLINIC    Provider:  Larey Seat, MD  Primary Care Physician:  [name], MD [address] [place] [address]     Referring Provider: [name], [place],  [place] [address]          Chief Complaint according to patient   Patient presents with:     New Patient (Initial Visit)           HISTORY OF PRESENT ILLNESS:  Michael Guzman is a 75 y.o. African American male patient and seen here  on 06/30/2021 from PCP  for a Sleep Consultation.  Chief concern according to patient :  I have no longer any sleep routine, ever since I retired 3 years ago. I had to retire when my wife fell ill, and she was hospitalized for 60 days at [facility]. It was an abrupt retirement. She remains on HD, 3 days a week.  His wife has witnessed snoring and apnea events. He gets only 4/5 hrs of sleep, sleep routine remains off. He watches Tv a lot, at night and is sleepy in daytime. He started on Lupron, has hot flushes. Never feels well rested. He has undergone many surgeries, shoulder, hip and also prostate cancer treatment all in the last 18 month. " I have not had a good day since Prostate radiation."    [name] has a past medical history of Arthritis, CAD (coronary artery disease), Chronic back pain, Chronically dry eyes, Diabetes mellitus with neuropathy , DKA (diabetic ketoacidoses) (01/30/2017), Elevated PSA, Fatty liver, GERD (gastroesophageal reflux disease), prostate radiation therapy, Hypertension, Hypertriglyceridemia, Intermediate coronary syndrome ([place]) (07/26/2012), and Prostate cancer  anti testosterone  treatment ([place]).     Sleep relevant medical history: Nocturia  not anymore  , hot flashes,  night sweats. No Tonsillectomy.  Insomnia with poor sleep hygiene, circadian rhythm. Snoring and apnea. CAD-  Family medical /sleep history: No other family member on CPAP with OSA, no insomnia,no sleep walkers.     Social history:  Patient is retired from [employer] position- and lives in a household with spouse.  Family status is married , with 4 adult children, 18 grandchildren. One son with 12 children.   Tobacco use:  quit 1990  ETOH use: quit 1980 , Caffeine intake in form of Coffee( 2-in AM  ) Soda( 1-2 a day) Tea ( /) or energy drinks. Regular exercise : too SOB. .     Sleep habits are as follows: The patient's dinner time is between 5-6 PM, and he often skips. No schedule. Watches TV on the couch, and often sleeps.  The patient goes to bed at  11.30 PM and continues to sleep for 1-2 hours, wakes for rare bathroom breaks,. The preferred sleep position is supine , with the support of 2 pillows. Dreams are reportedly vivid, pleasant,  frequent.  6.30  AM is the usual rise time. The patient wakes up spontaneously.  He reports not feeling refreshed or restored in AM, with symptoms such as dry mouth and residual fatigue. Lately, he had many headaches, he is hot when he wakes up, drenched in sweat.  Naps are taken frequently, lasting from 30 to 90 minutes and are less refreshing than nocturnal sleep.    Review of Systems: Out of a complete 14 system review, the patient complains of only the following symptoms, and all other reviewed systems are negative.:  Fatigue, sleepiness , snoring, fragmented sleep,  Insomnia- sleeps in daytime , and sometimes  at night- watches Tv all  day, sleeps in front of TV. Snores so loud his wife can hardly sleep.    How likely are you to doze in the following situations: 0 = not likely, 1 = slight chance, 2 = moderate chance, 3 = high chance   Sitting and Reading? Watching Television? Sitting inactive in a public place (theater or meeting)? As a passenger in a car for an hour without a break? Lying down in the afternoon when circumstances permit? Sitting and talking to someone? Sitting quietly after lunch without alcohol? In a car, while stopped for a  few minutes in traffic?   Total = 12-14/ 24 points   FSS endorsed at 32/ 63 points.   Social History   Socioeconomic History   Marital status: Married    Spouse name: Not on file   Number of children: 4   Years of education: 2 y colleg   Highest education level: Not on file  Occupational History   Occupation: SUPERVISOR    Employer: [employer]  Tobacco Use   Smoking status: Former    Packs/day: 0.75    Years: 50.00    Pack years: 37.50    Types: Cigarettes    Start date: 53    Quit date: 07/27/2011    Years since quitting: 9.9   Smokeless tobacco: Never  Substance and Sexual Activity   Alcohol use: Not Currently    Comment: qut 20 years ago per patient    Drug use: No   Sexual activity: Not on file  Other Topics Concern   Not on file  Social History Narrative   Works as    Investment banker, 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   Chronic back pain    Chronically dry eyes    Diabetes mellitus without complication (HCC)    TYPE 2    DKA (diabetic ketoacidoses) 01/30/2017   Elevated PSA    being monitored   Fatty liver    GERD (gastroesophageal reflux disease)    Hx of radiation therapy    prostate , [facility]    Hypertension    Hypertriglyceridemia    Intermediate coronary syndrome ([facility]) 07/26/2012   Prostate cancer ([facility])    RADTIATION     Past Surgical History:  Procedure Laterality Date   APPENDECTOMY     CARDIAC SURGERY  ~2017   3 stent placed.   COLONOSCOPY  02/12/2010   [facility]   LEFT HEART CATHETERIZATION WITH CORONARY ANGIOGRAM N/A 06/27/2012    Procedure: LEFT HEART CATHETERIZATION WITH CORONARY ANGIOGRAM;  Surgeon: Candee Furbish, MD;  [facility] C[facility] LAB;  Service: Cardiovascular;  Laterality: N/A;   LEFT HEART CATHETERIZATION WITH CORONARY ANGIOGRAM N/A 07/25/2012   Procedure: LEFT HEART CATHETERIZATION WITH CORONARY ANGIOGRAM;  Surgeon: Sueanne Margarita, MD;  Location: [facility] CATH LAB;  Service: Cardiovascular;  Laterality: N/A;   PROSTATE BIOPSY     RADIOLOGY WITH ANESTHESIA N/A 12/03/2015   Procedure: MRI LUMBAR SPINE;  Surgeon: Medication Radiologist, MD;  Location: [facility];  Service: Radiology;  Laterality: N/A;   TOTAL HIP ARTHROPLASTY Right 04/28/2019   Procedure: TOTAL HIP ARTHROPLASTY ANTERIOR APPROACH;  Surgeon: Dorna Leitz, MD;  Location: [facility];  Service: Orthopedics;  Laterality: Right;     Current Outpatient Medications on File Prior to Visit  Medication Sig Dispense Refill   Ascorbic Acid (VITAMIN C) 100 MG tablet Take 100 mg by mouth daily.     aspirin EC 81 MG tablet Take 1 tablet (81 mg total) by mouth daily. 90 tablet 3   atorvastatin (LIPITOR) 10 MG tablet Take 1 tablet (10 mg total) by mouth daily. Take 1 tablet (10 mg total) by mouth daily. 90 tablet 3   Blood Glucose Monitoring Suppl (ONE TOUCH ULTRA SYSTEM KIT) w/Device KIT Check blood sugar 1 time daily-DX-E11.22 1 each 0   buPROPion (WELLBUTRIN XL) 300 MG 24 hr tablet Take  1 tablet  Daily  for Mood, Focus & Concentration / Patient knows to take by mouth 90 tablet 3   Cholecalciferol (VITAMIN D3) 125 MCG (5000 UT) CAPS Take 10,000 Units by mouth daily.      docusate sodium (COLACE) 100 MG capsule Take 1 capsule (100 mg total) by mouth 2 (two) times daily. 30 capsule 0   DULoxetine (CYMBALTA) 60 MG capsule TAKE 1 CAPSULE BY MOUTH ONCE DAILY FOR  MOOD  AND  CHRONIC  PAIN 90 capsule 0   furosemide (LASIX) 40 MG tablet Take 1 tablet 2 x /day as needed  for Fluid Retention 180 tablet 3   gabapentin (NEURONTIN) 800 MG tablet TAKE 1 TABLET BY MOUTH 3 TO 4 TIMES DAILY FOR  PAIN, HOT FLASHES AND SWEATS 360 tablet 3   glucose blood (ONETOUCH ULTRA) test strip CHECK BLOOD SUGAR 3 TIMES  DAILY (Dx: e11.29) 300 strip 3   glycopyrrolate (ROBINUL) 2 MG tablet TAKE 1 TABLET BY MOUTH THREE TIMES DAILY FOR  EXCESSIVE  SWEATING 90 tablet 0   insulin NPH-regular Human (HUMULIN 70/30) (70-30) 100 UNIT/ML injection INJECT 50 UNITS INTO SKIN  TWICE DAILY (Patient taking differently: INJECT 50 UNITS INTO SKIN  TWICE DAILY) 90 mL 3   Insulin Syringes, Disposable, U-100 1 ML MISC 50 Units by Does not apply route 2 (two) times daily. inject 50 units into skin 2 x /day 200 each 3   Lancets (ONETOUCH DELICA PLUS [id number]) MISC CHECK BLOOD SUGAR 3 TIMES  DAILY 300 each 3   latanoprost (XALATAN) 0.005 % ophthalmic solution Place 1 drop into both eyes at bedtime.     Magnesium 250 MG TABS Take 250 mg by mouth daily.      metFORMIN (GLUCOPHAGE-XR) 500 MG 24 hr tablet TAKE 2 TABLETS BY MOUTH  TWICE DAILY WITH MEALS FOR  DIABETES 360 tablet 3   metoprolol succinate (TOPROL-XL) 25 MG 24 hr tablet TAKE ONE-HALF TABLET BY  MOUTH DAILY 45 tablet 3   montelukast (SINGULAIR) 10 MG tablet TAKE 1 TABLET BY MOUTH  DAILY FOR ALLERGIES 90 tablet 3   Multiple Vitamin (MULTIVITAMIN WITH MINERALS) TABS tablet Take 1 tablet by mouth daily.     nitroGLYCERIN (NITROSTAT) 0.4 MG SL tablet PLACE 1 TABLET UNDER THE TONGUE EVERY 5 MINUTES AS NEEDED FOR CHEST PAIN 25 tablet 6   pantoprazole (PROTONIX) 40 MG tablet TAKE 1 TABLET BY MOUTH  DAILY TO PREVENT HEARTBURN  AND INDIGESTION 90 tablet 3   Polyethyl Glycol-Propyl Glycol (SYSTANE) 0.4-0.3 % SOLN Place 2 drops into both eyes daily as  needed (for dry eyes).     tamsulosin (FLOMAX) 0.4 MG CAPS capsule Take 0.4 mg by mouth at bedtime.     Vitamin E 400 units TABS Take by mouth. Taking 800 IU a day     Zinc 30 MG TABS Take 30 mg by mouth daily.     No current facility-administered medications on file prior to visit.    No Known Allergies  Physical  exam:  Today's Vitals   06/30/21 1441  BP: 139/82  Pulse: 94  Weight: 210 lb (95.3 kg)  Height: 5' 10"  (1.778 m)   Body mass index is 30.13 kg/m.   Wt Readings from Last 3 Encounters:  06/30/21 210 lb (95.3 kg)  06/17/21 214 lb (97.1 kg)  05/28/21 217 lb 12.8 oz (98.8 kg)     Ht Readings from Last 3 Encounters:  06/30/21 5' 10"  (1.778 m)  06/17/21 5' 10"  (1.778 m)  04/16/21 5' 9"  (1.753 m)      General: The patient is awake, alert and appears not in acute distress. The patient is well groomed. He has full facial hair.  Head: Normocephalic, atraumatic. Neck is supple. Mallampati 3,  neck circumference:18 inches . Nasal airflow barely patent.  Retrognathia is not seen.  Dental status: some gaps, all teeth are biological.  Cardiovascular:  Regular rate and cardiac rhythm by pulse,  without distended neck veins. Respiratory: Lungs are clear to auscultation.  Skin:  Without evidence of ankle edema, or rash. Trunk: The patient's posture is erect.   Neurologic exam : The patient is awake and alert, oriented to place and time.   Memory subjective described as intact.  Attention span & concentration ability appears normal.  Speech is fluent,  without  dysarthria, dysphonia or aphasia.  Mood and affect are depressed, fatigued.    Cranial nerves: no loss of smell or taste reported  Pupils are equal and briskly reactive to light. Funduscopic exam  deferred. .  Extraocular movements in vertical planes were intact and without nystagmus. He did not follow  the horizontal planes, repeatedly-   No Diplopia. Visual fields by finger perimetry are intact. Hearing was intact to soft voice and finger rubbing.    Facial sensation intact to fine touch.  Facial motor strength is symmetric and tongue and uvula move midline.  Neck ROM : rotation, tilt and flexion extension were normal for age and shoulder shrug was symmetrical.    Motor exam:  Symmetric bulk, tone and ROM.   Normal tone  without cog wheeling, symmetric grip strength .   Sensory:  Fine touch,  and vibration were  normal. He reports feeling of cold feet.  Proprioception tested in the upper extremities was normal. Coordination: Rapid alternating movements in the fingers/hands were of normal speed.  The Finger-to-nose maneuver was intact without evidence of ataxia, dysmetria or tremor.  Gait and station: Patient could rise unassisted from a seated position, walked without assistive device.  Stance is of normal width/ base and the patient turned with 4 steps.  Deep tendon reflexes: in the upper and lower extremities are symmetric and intact.  Remarkable was that patella reflexes were more brisk than biceps/ triceps.  Babinski response was deferred.       After spending a total time of 60 minutes face to face and additional time for physical and neurologic examination, review of laboratory studies,  personal review of imaging studies, reports and results of other testing and review of referral information / records as far as  provided in visit, I have established the following assessments:  Mr. Fariss reports that with his retirement and the circumstances I have described above, a change in bowel routines of life took place, there were no longer have the routine times for meals, no longer times to leave the house and return and no longer scheduled bedtimes.  He often spends the day watching TV when he falls asleep or dozes off in front of the TV and is not always aware of how many hours he may have actually slept already.  Because he is known to be a loud snorer and sleeps in supine position, he has tried to stay away from the bedroom until his wife has gotten some sleep.  She is bothered by the snoring and it interrupts her sleep.  She has witnessed apneas.  For Mr. Elayne Guerin who is only 74 years old that has been a drastic change also in his level of exercise tolerance.  Some days he does not manage to go to the mailbox  and walk back without shortness of breath.  Stairs have become a real hindrance to him.  He has been followed for hypertension, hyperlipidemia, for vitamin D deficiency and idiopathic diabetes mellitus type 2, he has coronary artery disease and in 2015 was diagnosed with aortic atherosclerosis.  He had GERD gastroesophageal reflux disease.  Prostate cancer was treated with radiation not surgery and also his Lupron.  He feels that this has made a big impact on his sleep.  Hypertension is already known for over 30 years.  His coronary artery disease is classified as a as CAD and was treated with a stent placement after angioplasty in 2013.  A stress test by Carlton Adam in February 2020 just before the pandemic was normal.  Triglycerides remain high at 197 based on the lab test from February 2022.  Hg B A1c was quite well controlled 6.7, he controlled his DM with diet ,Insulin  medication and  used to do more exercise.  1) he is currently on Wellbutrin, Cymbalta generic form duloxetine, gabapentin he is on Humulin insulin, metoprolol Flomax pantoprazole.  I have no doubt that this pleasant gentleman does have sleep apnea given his anatomy of his upper airway, a larger neck circumference and the symptoms that his wife has witnessed.  However there has been a decrease in sleep quality that may have nothing to do with his apnea given that he has nocturnal hot flashes, wakes up diaphoretic, and that his change of sleep routines has truly impaired him.    My Plan is to proceed with:  1)We will talk about sleep hygiene today and also about screening him for sleep apnea in which treatment options we will have available for him.    So I will order an attended sleep study as well as a home sleep test to see which one his insurance would like, 2) my next step will be to give him a handout about sleep quality and how to improve his sleep avoiding hopefully some daytime naps in order to get better nocturnal sleep.  I would  also like him to try to avoid watching TV when he is ready to sleep.  I would like to thank [name], MD and [name], [place],  [place] [address] for allowing me to meet with and to take care of this pleasant patient.   In short, KEON PENDER is presenting with deteriorated sleep habits, sleep hygiene, a symptom that can  be attributed to hormonal prostate treatment and social radical changes. , and he likely also has OSA. .   I plan to follow up either personally or through our NP within 2-4  month.   CC: I will share my notes with Dr Melford Aase, Dr Marlou Porch. .  Electronically signed by: Larey Seat, MD 06/30/2021 3:08 PM  [facility] Board certified by The [employer] of Sleep Medicine and Diplomate of the [employer] of Sleep Medicine. Board certified In Neurology through the [place], Fellow of the [employer] of Neurology. Medical Director of [facility].

## 2021-07-07 ENCOUNTER — Ambulatory Visit: Payer: Medicare Other | Admitting: Neurology

## 2021-07-07 DIAGNOSIS — Z72821 Inadequate sleep hygiene: Secondary | ICD-10-CM

## 2021-07-07 DIAGNOSIS — G4719 Other hypersomnia: Secondary | ICD-10-CM

## 2021-07-07 DIAGNOSIS — K219 Gastro-esophageal reflux disease without esophagitis: Secondary | ICD-10-CM | POA: Diagnosis not present

## 2021-07-07 DIAGNOSIS — R61 Generalized hyperhidrosis: Secondary | ICD-10-CM

## 2021-07-07 DIAGNOSIS — G4710 Hypersomnia, unspecified: Secondary | ICD-10-CM

## 2021-07-07 DIAGNOSIS — R5383 Other fatigue: Secondary | ICD-10-CM

## 2021-07-07 DIAGNOSIS — R1312 Dysphagia, oropharyngeal phase: Secondary | ICD-10-CM | POA: Diagnosis not present

## 2021-07-07 DIAGNOSIS — G4762 Sleep related leg cramps: Secondary | ICD-10-CM

## 2021-07-07 DIAGNOSIS — R0683 Snoring: Secondary | ICD-10-CM

## 2021-07-14 ENCOUNTER — Telehealth: Payer: Self-pay

## 2021-07-14 NOTE — Telephone Encounter (Signed)
LVM for pt to call me back to schedule sleep study. Patient did not have enough recording time on HST and will need to repeat. LVM for patient to call back and reschedule.

## 2021-07-16 NOTE — Progress Notes (Signed)
MEDICARE ANNUAL WELLNESS VISIT AND FOLLOW UP   Assessment:    Encounter for Medicare annual wellness exam Yearly  Essential hypertension - continue medications, DASH diet, exercise and monitor at home. Call if greater than 130/80.  -     CBC with Differential/Platelet -     CMP/GFR  Type 2 diabetes mellitus with stage 2 chronic kidney disease, with long-term current use of insulin (HCC) Doing well NPH 70/30 injecting 50units BID, Metformin 109m BID Monitor injection sites Education: Reviewed 'ABCs' of diabetes management (respective goals in parentheses):  A1C (<7.5), blood pressure (<130/80), and cholesterol (LDL <70) Eye Exam yearly and Dental Exam every 6 months. Dietary recommendations Physical Activity recommendations -     Hemoglobin A1c   ASCAD s/p PTCA (06/2012) Control blood pressure, cholesterol, glucose, increase exercise.  Followed by cardiology  Gastroesophageal reflux disease without esophagitis Continue PPI/H2 blocker, diet discussed  Diabetic peripheral neuropathy ([place] Check feet daily. Normal foot exam today Continue Cymbalta and Gabapentin -     Hemoglobin A1c Increase fluids, avoid NSAIDS, control sugars/BP, will monitor  Thyroiditis Monitor, normal scan 2017, followed by Dr. GCruzita Lederer-     TSH  Mixed hyperlipidemia Continue medications: Atorvastatin 10 mg QD Discussed dietary and exercise modifications Low fat diet Lipid panel  Medication management Continued  Class 2 Severe Obesity with Serious Comorbidity(HCC) Long discussion about weight loss, diet, and exercise Recommended diet heavy in fruits and veggies and low in animal meats, cheeses, and dairy products, appropriate calorie intake Patient will work on walking daily 10-15 min, twice a day if can, with wife Discussed appropriate weight for height  Follow up at next visit  Atherosclerosis of aorta ([facility] Chest Xray 01/11/21 Control blood pressure, cholesterol, glucose, increase  exercise.   Fatty infiltration of liver Weight loss advised, avoid alcohol/tylenol, will monitor LFTs  Prostate cancer Followed by Dr. EJunious Silk treated by radiation, completed lupron  Former smoker (37 pack years, quit in 2012) Neg CXR 12/2020; never had low dose CT; discussed but declines at this time  OSA Currently undergoing sleep study with Dr. DBrett Fairy Medication Management Continued  Flu Vaccine Need High dose Flu Vaccine given  Further disposition pending results if labs check today. Discussed med's effects and SE's.   Over 30 minutes of face to face interview, exam, counseling, chart review, and critical decision making was performed.    Future Appointments  Date Time Provider [place] 07/21/2021 11:30 AM MMagda Bernheim NP [facility] None  07/23/2021  1:00 PM [facility] SLE[facility] None  10/17/2021 11:00 AM MUnk Pinto MD [facility] None  04/16/2022 10:00 AM MUnk Pinto MD [facility] None  07/20/2022 10:00 AM MMagda Bernheim NP [facility] None     Plan:   During the course of the visit the patient was educated and counseled about appropriate screening and preventive services including:   Pneumococcal vaccine  Influenza vaccine Prevnar 13 Td vaccine Screening electrocardiogram Colorectal cancer screening Diabetes screening Glaucoma screening Nutrition counseling    Subjective:  Michael LIKINSis a 75y.o. male who presents accompanied by his wife for Medicare Annual Wellness Visit and 3 month follow up for HTN, HLD, T2 diabetes, and vitamin D Def.   He has hx of ASCAD s/p PTCA (06/2012), last stress test 10/2018 was normal obtained for surgical clearance, followed by Dr. SMarlou Porch   Former smoker, 37.5 pack year history, quit in 2012; last CXR 12/2020 was normal  hx of prostate cancer treated by radiation in 2016-2017;  he is followed by Dr. Eskridge q81m last visit 03/2020  Some concern of ongoing depression; currently treated by  wellbutrin 300 mg daily.. Patient states he is not depressed. He reports otherwise his mood is fine.   BMI is Body mass index is 30.73 kg/m., he has been working on diet, eating less, PT 2 hours per week and walking and doing strength exercises daily.  Wt Readings from Last 3 Encounters:  07/21/21 214 lb 3.2 oz (97.2 kg)  06/30/21 210 lb (95.3 kg)  06/17/21 214 lb (97.1 kg)   His blood pressure has been controlled at home, he is on toprol XL 1.25 mg daily only, day their BP is BP: 134/84 He does workout. He denies chest pain, shortness of breath, dizziness.  He had sleep study 2 weeks ago but had an error so redoing sleep study this week. Dr. DBrett Fairyis following for sleep apnea.    He is on cholesterol medication Atorvastatin 20 mg daily and denies myalgias. His cholesterol is at goal. The cholesterol last visit was:   Lab Results  Component Value Date   CHOL 114 04/16/2021   HDL 43 04/16/2021   LDLCALC 47 04/16/2021   LDLDIRECT 45.7 10/30/2013   TRIG 159 (H) 04/16/2021   CHOLHDL 2.7 04/16/2021   He has been working on diet and exercise for Diabetes with diabetic chronic kidney disease, with other circulatory complications and with diabetic polyneuropathy , he is on bASA, he is not on ACE/ARB due to low BPS, sugars in the AM are 92-160. Currently taking Humulin 70/30 50 units BID Also on metformin 2000 mg daily. He denies hypoglycemia , polydipsia and polyuria. He has gabapentin prescribed for intermittent LE parasthesias, takes 800 mg QID PRN, none in the last month.  Last A1C was:  Lab Results  Component Value Date   HGBA1C 8.3 (H) 04/16/2021   Lab Results  Component Value Date   GFRAA 78 01/21/2021   He has a history of thyroiditis, TSHs have been monitored closely and remained in normal range.   Lab Results  Component Value Date   TSH 0.86 04/16/2021  . Patient is on Vitamin D supplement.  Currently on 10000 units daily Lab Results  Component Value Date   VD25OH 113  (H) 04/16/2021       Medication Review: Current Outpatient Medications on File Prior to Visit  Medication Sig Dispense Refill   Ascorbic Acid (VITAMIN C) 100 MG tablet Take 100 mg by mouth daily.     aspirin EC 81 MG tablet Take 1 tablet (81 mg total) by mouth daily. 90 tablet 3   atorvastatin (LIPITOR) 10 MG tablet Take 1 tablet (10 mg total) by mouth daily. Take 1 tablet (10 mg total) by mouth daily. 90 tablet 3   Blood Glucose Monitoring Suppl (ONE TOUCH ULTRA SYSTEM KIT) w/Device KIT Check blood sugar 1 time daily-[id number] 1 each 0   buPROPion (WELLBUTRIN XL) 300 MG 24 hr tablet Take  1 tablet  Daily  for Mood, Focus & Concentration / Patient knows to take by mouth 90 tablet 3   Cholecalciferol (VITAMIN D3) 125 MCG (5000 UT) CAPS Take 10,000 Units by mouth daily.      docusate sodium (COLACE) 100 MG capsule Take 1 capsule (100 mg total) by mouth 2 (two) times daily. 30 capsule 0   DULoxetine (CYMBALTA) 60 MG capsule TAKE 1 CAPSULE BY MOUTH ONCE DAILY FOR  MOOD  AND  CHRONIC  PAIN 90 capsule 0   furosemide (  LASIX) 40 MG tablet Take 1 tablet 2 x /day as needed  for Fluid Retention 180 tablet 3   gabapentin (NEURONTIN) 800 MG tablet TAKE 1 TABLET BY MOUTH 3 TO 4 TIMES DAILY FOR PAIN, HOT FLASHES AND SWEATS 360 tablet 3   glucose blood (ONETOUCH ULTRA) test strip CHECK BLOOD SUGAR 3 TIMES  DAILY (Dx: e11.29) 300 strip 3   glycopyrrolate (ROBINUL) 2 MG tablet TAKE 1 TABLET BY MOUTH THREE TIMES DAILY FOR  EXCESSIVE  SWEATING 90 tablet 0   insulin NPH-regular Human (HUMULIN 70/30) (70-30) 100 UNIT/ML injection INJECT 50 UNITS INTO SKIN  TWICE DAILY (Patient taking differently: INJECT 50 UNITS INTO SKIN  TWICE DAILY) 90 mL 3   Insulin Syringes, Disposable, U-100 1 ML MISC 50 Units by Does not apply route 2 (two) times daily. inject 50 units into skin 2 x /day 200 each 3   Lancets (ONETOUCH DELICA PLUS [id number]) MISC CHECK BLOOD SUGAR 3 TIMES  DAILY 300 each 3   latanoprost (XALATAN) 0.005 %  ophthalmic solution Place 1 drop into both eyes at bedtime.     Magnesium 250 MG TABS Take 250 mg by mouth daily.      metFORMIN (GLUCOPHAGE-XR) 500 MG 24 hr tablet TAKE 2 TABLETS BY MOUTH  TWICE DAILY WITH MEALS FOR  DIABETES 360 tablet 3   metoprolol succinate (TOPROL-XL) 25 MG 24 hr tablet TAKE ONE-HALF TABLET BY  MOUTH DAILY 45 tablet 3   montelukast (SINGULAIR) 10 MG tablet TAKE 1 TABLET BY MOUTH  DAILY FOR ALLERGIES 90 tablet 3   Multiple Vitamin (MULTIVITAMIN WITH MINERALS) TABS tablet Take 1 tablet by mouth daily.     nitroGLYCERIN (NITROSTAT) 0.4 MG SL tablet PLACE 1 TABLET UNDER THE TONGUE EVERY 5 MINUTES AS NEEDED FOR CHEST PAIN 25 tablet 6   pantoprazole (PROTONIX) 40 MG tablet TAKE 1 TABLET BY MOUTH  DAILY TO PREVENT HEARTBURN  AND INDIGESTION 90 tablet 3   Polyethyl Glycol-Propyl Glycol (SYSTANE) 0.4-0.3 % SOLN Place 2 drops into both eyes daily as needed (for dry eyes).     tamsulosin (FLOMAX) 0.4 MG CAPS capsule Take 0.4 mg by mouth at bedtime.     Vitamin E 400 units TABS Take by mouth. Taking 800 IU a day     Zinc 30 MG TABS Take 30 mg by mouth daily.     No current facility-administered medications on file prior to visit.    Allergies: No Active Allergies  Current Problems (verified) has Hypertension (1991); GERD; ASCAD s/p PTCA (06/2012); Hyperlipidemia associated with type 2 diabetes mellitus ([place]); Vitamin D deficiency; Medication management; Diabetic peripheral neuropathy ([place]); Malignant neoplasm of prostate ([place]); Thyroiditis; Obesity (BMI 30.0-34.9); Type 2 diabetes mellitus with vascular disease ([place]); Aortic atherosclerosis ([place]) by Abd CT scan 2015; Microalbuminuric diabetic nephropathy ([place]); Hypertriglyceridemia; Fatty infiltration of liver; Former smoker; Primary osteoarthritis of hip; AMS (altered mental status); Hypoglycemia due to insulin; Acute encephalopathy; Hypothermia; and Diabetes mellitus with coincident hypertension ([place]) on their problem  list.  Screening Tests Immunization History  Administered Date(s) Administered   Influenza Split 06/26/2012   Influenza, High Dose Seasonal PF 06/20/2015, 06/23/2016, 06/14/2017, 06/14/2019, 07/01/2020   Influenza-Unspecified 06/14/2013, 06/25/2014, 06/03/2018   PFIZER(Purple Top)SARS-COV-2 Vaccination 10/28/2019, 11/22/2019, 06/26/2020   Pneumococcal Conjugate-13 04/23/2016   Pneumococcal Polysaccharide-23 06/14/2017   Pneumococcal-Unspecified 06/16/2010   Td 09/17/2003   Tdap 11/26/2013   Zoster, Live 07/12/2012    Preventative care: Last colonoscopy: Scheduled for 10/2021 Dr. Luan Pulling Echo 01/2018 Stress test 2017, 10/2018 Bone  scan 04/2016 Thyroid 2017 Ct head 2018   Last CXR 11/2018 former smoker, declines CT today but will consider   Prior vaccinations: TD or Tdap: 2015  Influenza:07/21/21  Pneumococcal: 2011, 2018 Prevnar13: 2017 Shingles/Zostavax: 2013, Discussed  Covid Names of Other Physician/Practitioners you currently use: 1. [facility] here for primary care 2. Dr. Gershon Crane, eye doctor, last visit 06/17/21 3. [facility] dent[facility], dentist, last visit 2022  Patient Care Team: [name], MD as PCP - General (Internal Medicine) Jerline Pain, MD as PCP - Cardiology (Cardiology)  Surgical: He  has a past surgical history that includes Appendectomy; Cardiac surgery (~2017); left heart catheterization with coronary angiogram (N/A, 06/27/2012); left heart catheterization with coronary angiogram (N/A, 07/25/2012); Colonoscopy (02/12/2010); Radiology with anesthesia (N/A, 12/03/2015); Prostate biopsy; and Total hip arthroplasty (Right, 04/28/2019). Family His family history includes Diabetes in his sister. Social history  He reports that he quit smoking about 9 years ago. His smoking use included cigarettes. He started smoking about 62 years ago. He has a 37.50 pack-year smoking history. He has never used smokeless tobacco. He  reports that he does not currently use alcohol. He reports that he does not use drugs.  MEDICARE WELLNESS OBJECTIVES: Physical activity: Current Exercise Habits: Home exercise routine, Type of exercise: walking, Time (Minutes): 20, Frequency (Times/Week): 7, Weekly Exercise (Minutes/Week): 140, Intensity: Mild Cardiac risk factors: Cardiac Risk Factors include: diabetes mellitus;dyslipidemia;hypertension;obesity (BMI >30kg/m2);advanced age (>76mn, >>83women);sedentary lifestyle;male gender Depression/mood screen:   [facility]  Decreased Interest 0  Down, Depressed, Hopeless 0  PHQ - 2 Score 0  Altered sleeping -  Tired, decreased energy -  Change in appetite -  Feeling bad or failure about yourself  -  Trouble concentrating -  Moving slowly or fidgety/restless -  Suicidal thoughts -  PHQ-9 Score -  Difficult doing work/chores -  Some recent data might be hidden    ADLs:  In your present state of health, do you have any difficulty performing the following activities: 07/21/2021 01/12/2021  Hearing? N N  Vision? N N  Difficulty concentrating or making decisions? N N  Walking or climbing stairs? N N  Dressing or bathing? N N  Doing errands, shopping? N N  Some recent data might be hidden     Cognitive Testing  Alert? Yes  Normal Appearance?Yes  Oriented to person? Yes  Place? Yes   Time? Yes  Recall of three objects?  Yes  Can perform simple calculations? Yes  Displays appropriate judgment?Yes  Can read the correct time from a watch face?Yes  EOL planning: Does Patient Have a Medical Advance Directive?: Yes Type of Advance Directive: Healthcare Power of Attorney, Living will Does patient want to make changes to medical advance directive?: No - Patient declined Copy of [place] Chart?: No - copy requested  Review of Systems  Constitutional:  Negative for chills, fever and weight loss.  HENT:  Positive for tinnitus. Negative for  congestion and hearing loss.   Eyes:  Negative for blurred vision and double vision.  Respiratory:  Negative for cough and shortness of breath.   Cardiovascular:  Negative for chest pain, palpitations, orthopnea and leg swelling.  Gastrointestinal:  Negative for abdominal pain, constipation, diarrhea, heartburn, nausea and vomiting.  Genitourinary:  Negative for dysuria.  Musculoskeletal:  Negative for falls, joint pain and myalgias.  Skin:  Negative for rash.  Neurological:  Positive for tingling (burning pain in feet). Negative for  dizziness, tremors, loss of consciousness and headaches.  Endo/Heme/Allergies:  Does not bruise/bleed easily.  Psychiatric/Behavioral:  Negative for depression, memory loss and suicidal ideas.     Objective:   Today's Vitals   07/21/21 1053  BP: 134/84  Pulse: 89  Temp: 97.6 F (36.4 C)  SpO2: 98%  Weight: 214 lb 3.2 oz (97.2 kg)    Body mass index is 30.73 kg/m.  General appearance: alert, no distress, WD/WN, male HEENT: normocephalic, sclerae anicteric, TMs pearly, nares patent, no discharge or erythema, pharynx normal Oral cavity: MMM, no lesions Neck: supple, no lymphadenopathy, no thyromegaly, no masses Heart: RRR, normal S1, S2, no murmurs Lungs: CTA bilaterally, no wheezes, rhonchi, or rales Abdomen: +bs, soft, non tender, non distended, no masses, no hepatomegaly, no splenomegaly Musculoskeletal: nontender, no swelling, no obvious deformity; slow sitting to standing, slow steady gait with cane  Extremities: no edema, no cyanosis, no clubbing Pulses: 2+ symmetric, upper and lower extremities, normal cap refill Neurological: alert, oriented x 3, CN2-12 intact, strength normal upper extremities and lower extremities, sensation was normal bilateral feet to monofilament, DTRs 2+ throughout, no cerebellar signs Psychiatric: depressed affect, behavior normal  Medicare Attestation I have personally reviewed: The patient's medical and social  history Their use of alcohol, tobacco or illicit drugs Their current medications and supplements The patient's functional ability including ADLs,fall risks, home safety risks, cognitive, and hearing and visual impairment Diet and physical activities Evidence for depression or mood disorders  The patient's weight, height, BMI, and visual acuity have been recorded in the chart.  I have made referrals, counseling, and provided education to the patient based on review of the above and I have provided the patient with a written personalized care plan for preventive services.      Magda Bernheim ANP-C  [facility]  07/21/2021

## 2021-07-17 ENCOUNTER — Encounter: Payer: Self-pay | Admitting: Internal Medicine

## 2021-07-21 ENCOUNTER — Encounter: Payer: Self-pay | Admitting: Nurse Practitioner

## 2021-07-21 ENCOUNTER — Ambulatory Visit (INDEPENDENT_AMBULATORY_CARE_PROVIDER_SITE_OTHER): Payer: Medicare Other | Admitting: Nurse Practitioner

## 2021-07-21 ENCOUNTER — Other Ambulatory Visit: Payer: Self-pay

## 2021-07-21 VITALS — BP 134/84 | HR 89 | Temp 97.6°F | Wt 214.2 lb

## 2021-07-21 DIAGNOSIS — Z23 Encounter for immunization: Secondary | ICD-10-CM | POA: Diagnosis not present

## 2021-07-21 DIAGNOSIS — E559 Vitamin D deficiency, unspecified: Secondary | ICD-10-CM

## 2021-07-21 DIAGNOSIS — Z0001 Encounter for general adult medical examination with abnormal findings: Secondary | ICD-10-CM

## 2021-07-21 DIAGNOSIS — R6889 Other general symptoms and signs: Secondary | ICD-10-CM

## 2021-07-21 DIAGNOSIS — Z0000 Encounter for general adult medical examination without abnormal findings: Secondary | ICD-10-CM

## 2021-07-21 DIAGNOSIS — G4733 Obstructive sleep apnea (adult) (pediatric): Secondary | ICD-10-CM | POA: Diagnosis not present

## 2021-07-21 DIAGNOSIS — Z79899 Other long term (current) drug therapy: Secondary | ICD-10-CM | POA: Diagnosis not present

## 2021-07-21 DIAGNOSIS — K760 Fatty (change of) liver, not elsewhere classified: Secondary | ICD-10-CM | POA: Diagnosis not present

## 2021-07-21 DIAGNOSIS — K219 Gastro-esophageal reflux disease without esophagitis: Secondary | ICD-10-CM | POA: Diagnosis not present

## 2021-07-21 DIAGNOSIS — N182 Chronic kidney disease, stage 2 (mild): Secondary | ICD-10-CM

## 2021-07-21 DIAGNOSIS — E785 Hyperlipidemia, unspecified: Secondary | ICD-10-CM | POA: Diagnosis not present

## 2021-07-21 DIAGNOSIS — E1122 Type 2 diabetes mellitus with diabetic chronic kidney disease: Secondary | ICD-10-CM | POA: Diagnosis not present

## 2021-07-21 DIAGNOSIS — E069 Thyroiditis, unspecified: Secondary | ICD-10-CM

## 2021-07-21 DIAGNOSIS — I700 Atherosclerosis of aorta: Secondary | ICD-10-CM | POA: Diagnosis not present

## 2021-07-21 DIAGNOSIS — I10 Essential (primary) hypertension: Secondary | ICD-10-CM

## 2021-07-21 DIAGNOSIS — E1169 Type 2 diabetes mellitus with other specified complication: Secondary | ICD-10-CM | POA: Diagnosis not present

## 2021-07-21 DIAGNOSIS — E1142 Type 2 diabetes mellitus with diabetic polyneuropathy: Secondary | ICD-10-CM | POA: Diagnosis not present

## 2021-07-21 DIAGNOSIS — Z87891 Personal history of nicotine dependence: Secondary | ICD-10-CM

## 2021-07-21 DIAGNOSIS — E669 Obesity, unspecified: Secondary | ICD-10-CM

## 2021-07-21 DIAGNOSIS — Z8546 Personal history of malignant neoplasm of prostate: Secondary | ICD-10-CM

## 2021-07-21 DIAGNOSIS — Z794 Long term (current) use of insulin: Secondary | ICD-10-CM

## 2021-07-21 NOTE — Patient Instructions (Signed)

## 2021-07-22 LAB — CBC WITH DIFFERENTIAL/PLATELET
Absolute Monocytes: 691 cells/uL (ref 200–950)
Basophils Absolute: 38 cells/uL (ref 0–200)
Basophils Relative: 0.7 %
Eosinophils Absolute: 221 cells/uL (ref 15–500)
Eosinophils Relative: 4.1 %
HCT: 39.8 % (ref 38.5–50.0)
Hemoglobin: 13.2 g/dL (ref 13.2–17.1)
Lymphs Abs: 1577 cells/uL (ref 850–3900)
MCH: 29.3 pg (ref 27.0–33.0)
MCHC: 33.2 g/dL (ref 32.0–36.0)
MCV: 88.2 fL (ref 80.0–100.0)
MPV: 11.3 fL (ref 7.5–12.5)
Monocytes Relative: 12.8 %
Neutro Abs: 2873 cells/uL (ref 1500–7800)
Neutrophils Relative %: 53.2 %
Platelets: 179 10*3/uL (ref 140–400)
RBC: 4.51 10*6/uL (ref 4.20–5.80)
RDW: 12.8 % (ref 11.0–15.0)
Total Lymphocyte: 29.2 %
WBC: 5.4 10*3/uL (ref 3.8–10.8)

## 2021-07-22 LAB — COMPLETE METABOLIC PANEL WITH GFR
AG Ratio: 1.8 (calc) (ref 1.0–2.5)
ALT: 46 U/L (ref 9–46)
AST: 41 U/L — ABNORMAL HIGH (ref 10–35)
Albumin: 4.6 g/dL (ref 3.6–5.1)
Alkaline phosphatase (APISO): 74 U/L (ref 35–144)
BUN: 10 mg/dL (ref 7–25)
CO2: 28 mmol/L (ref 20–32)
Calcium: 9.9 mg/dL (ref 8.6–10.3)
Chloride: 102 mmol/L (ref 98–110)
Creat: 1.04 mg/dL (ref 0.70–1.28)
Globulin: 2.6 g/dL (calc) (ref 1.9–3.7)
Glucose, Bld: 101 mg/dL — ABNORMAL HIGH (ref 65–99)
Potassium: 4.4 mmol/L (ref 3.5–5.3)
Sodium: 138 mmol/L (ref 135–146)
Total Bilirubin: 0.5 mg/dL (ref 0.2–1.2)
Total Protein: 7.2 g/dL (ref 6.1–8.1)
eGFR: 75 mL/min/{1.73_m2} (ref >=60)

## 2021-07-22 LAB — LIPID PANEL
Cholesterol: 125 mg/dL (ref <200)
HDL: 45 mg/dL (ref >=40)
LDL Cholesterol (Calc): 57 mg/dL (calc)
Non-HDL Cholesterol (Calc): 80 mg/dL (calc) (ref <130)
Total CHOL/HDL Ratio: 2.8 (calc) (ref <5.0)
Triglycerides: 147 mg/dL (ref <150)

## 2021-07-22 LAB — HEMOGLOBIN A1C
Hgb A1c MFr Bld: 6.7 % of total Hgb — ABNORMAL HIGH (ref <5.7)
Mean Plasma Glucose: 146 mg/dL
eAG (mmol/L): 8.1 mmol/L

## 2021-07-22 LAB — TSH: TSH: 0.78 mIU/L (ref 0.40–4.50)

## 2021-07-23 ENCOUNTER — Ambulatory Visit (INDEPENDENT_AMBULATORY_CARE_PROVIDER_SITE_OTHER): Payer: Medicare Other | Admitting: Neurology

## 2021-07-23 DIAGNOSIS — G4733 Obstructive sleep apnea (adult) (pediatric): Secondary | ICD-10-CM

## 2021-07-23 DIAGNOSIS — G4762 Sleep related leg cramps: Secondary | ICD-10-CM

## 2021-07-23 DIAGNOSIS — R5383 Other fatigue: Secondary | ICD-10-CM

## 2021-07-23 DIAGNOSIS — R0683 Snoring: Secondary | ICD-10-CM

## 2021-07-23 DIAGNOSIS — G4719 Other hypersomnia: Secondary | ICD-10-CM

## 2021-07-23 DIAGNOSIS — G4710 Hypersomnia, unspecified: Secondary | ICD-10-CM

## 2021-07-23 DIAGNOSIS — Z72821 Inadequate sleep hygiene: Secondary | ICD-10-CM

## 2021-07-23 DIAGNOSIS — R61 Generalized hyperhidrosis: Secondary | ICD-10-CM

## 2021-07-24 NOTE — Progress Notes (Signed)
[facility] [place] TEST REPORT ( by Watch PAT)   STUDY DATE:  07-26-2021 DOB:  November 23, 1945    ORDERING CLINICIAN: Larey Seat, MD  REFERRING CLINICIAN: [name], MD    CLINICAL INFORMATION/HISTORY: 10.17.22, 75 year-old AAM patient who reports non restorative sleep and excessive daytime sleepiness. His routines have completely changed since 2019 when the patient abruptly retired since his wife was very ill at the time and she now remains on Dialysis 3 times a week. He is snoring loudly, is less active (physically) and often asleep in front of the TV.  Insomnia appears related to poor sleep hygiene, decompensated circadian rhythm. Snoring and apnea are witnessed by wife. He had many headaches in the morning, feels not refreshed, and he is often severely diaphoretic.  He has aortic atherosclerosis, Prostate cancer  anti testosterone  treatment ([place]), and Hyperhidrosis at night, Glaucoma, Chronic back pain, Chronically dry eyes, Diabetes mellitus with neuropathy , DKA (diabetic ketoacidoses) (01/30/2017), Elevated PSA, Fatty liver, GERD (gastroesophageal reflux disease), prostate radiation therapy, Hypertension, Hypertriglyceridemia, Intermediate coronary syndrome ([place]) (07/26/2012).    Epworth sleepiness score: 14/24.FSS at 32/63     BMI: 30 kg/m   Neck Circumference: 18"   FINDINGS:   Sleep Summary:    Total recording time amounted to 10 hours and 10 minutes of which sleep time was calculated to amount to 8 hours and 52 minutes.         Percent REM (%):   22.1%                                     Respiratory Indices:   Calculated pAHI (per hour):    27.4/h                        The REM AHI was 31.9/h versus a non-REM AHI of 26.3/h.                                                                     Positional AHI:   In supine sleep the AHI was 25.1/h. and AHI on the left side 43.6/h. The patient only slept 37 minutes on the left side. Snoring volume  was at 41 dB mean volume.  This is a moderate degree of snoring and did accompany at 18.3% of the total sleep time.  Please note that the RDI and the AHI were the same.                                                Oxygen Saturation Statistics:   O2 Saturation Range (%): Oxygen saturation varied between a nadir at 74% and a maximum of 100% with a mean oxygen saturation of 96%.  Only 8.1 minutes of total sleep time was found to be doing hypoxia, this equals 1.5% of sleep time.  O2 Saturation (minutes) <89%:   8.1 minutes        Pulse Rate Statistics:       Pulse Range:   Between 4419 bpm with a mean heart rate of 80 bpm.  There were several outliers of bradycardia and tachycardia during REM sleep. These were not motion artifacts.  Oxygen desaturation was brief but frequently seen during REM sleep.              IMPRESSION:  This HST confirms the presence of obstructive sleep apnea at a moderate degree with an AHI of 27.4, marginal REM sleep accentuation.  Hypoxia however was frequently seen for brief periods of time during REM sleep.  Snoring was of average volume.   RECOMMENDATION: The degree of apnea and snoring should be responsive to a treatment by positive airway pressure.  I would like for the patient to also establish regular sleep times which will help to achieve the compliance required for successful CPAP treatment.  I like for the patient to start with an auto titrating CPAP device settings between 5 and 16 cmH2O pressure, 2 cm EPR, heated humidification for machine and tubing, and a mask of the patient's choice to be fitted in a reclined position in a face-to-face visit.  Revisit with 30 to 90 days of initiation of CPAP therapy.  Please advise the patient that he can change the mask and interface within 30 days at no cost to him.  I encouraged the patient to not watch TV in bed, and that for insomnia treatment he may need to try nonaddictive  nutritional supplements melatonin, valerian. Set a bed time and rise time.   PS : Please note that trazodone was not prescribed given that he had been on Wellbutrin and Cymbalta at the time I saw him.   INTERPRETING PHYSICIAN:   Larey Seat, MD  Board Certified [facility]   Medical Director of [facility] at [facility].

## 2021-08-04 ENCOUNTER — Other Ambulatory Visit: Payer: Self-pay | Admitting: Adult Health

## 2021-08-04 DIAGNOSIS — E119 Type 2 diabetes mellitus without complications: Secondary | ICD-10-CM

## 2021-08-04 DIAGNOSIS — R61 Generalized hyperhidrosis: Secondary | ICD-10-CM

## 2021-08-04 DIAGNOSIS — R5383 Other fatigue: Secondary | ICD-10-CM | POA: Insufficient documentation

## 2021-08-04 DIAGNOSIS — Z72821 Inadequate sleep hygiene: Secondary | ICD-10-CM | POA: Insufficient documentation

## 2021-08-04 DIAGNOSIS — G4719 Other hypersomnia: Secondary | ICD-10-CM | POA: Insufficient documentation

## 2021-08-04 DIAGNOSIS — G4710 Hypersomnia, unspecified: Secondary | ICD-10-CM | POA: Insufficient documentation

## 2021-08-04 DIAGNOSIS — M159 Polyosteoarthritis, unspecified: Secondary | ICD-10-CM

## 2021-08-04 NOTE — Procedures (Signed)
[facility] TEST REPORT ( by Watch PAT)   STUDY DATE:  07-26-2021 DOB:  10/09/1945     ORDERING CLINICIAN: Larey Seat, MD  REFERRING CLINICIAN: [name], MD    CLINICAL INFORMATION/HISTORY: 10.17.22, 75 year-old AAM patient who reports non restorative sleep and excessive daytime sleepiness. His routines have completely changed since 2019 when the patient abruptly retired since his wife was very ill at the time and she now remains on Dialysis 3 times a week. He is snoring loudly, is less active (physically) and often asleep in front of the TV.  Insomnia appears related to poor sleep hygiene, decompensated circadian rhythm. Snoring and apnea are witnessed by wife. He had many headaches in the morning, feels not refreshed, and he is often severely diaphoretic.  He has aortic atherosclerosis, Prostate cancer  anti testosterone  treatment ([place]), and Hyperhidrosis at night, Glaucoma, Chronic back pain, Chronically dry eyes, Diabetes mellitus with neuropathy , DKA (diabetic ketoacidoses) (01/30/2017), Elevated PSA, Fatty liver, GERD (gastroesophageal reflux disease), prostate radiation therapy, Hypertension, Hypertriglyceridemia, Intermediate coronary syndrome ([place]) (07/26/2012).     Epworth sleepiness score: 14/24.FSS at 32/63     BMI: 30 kg/m   Neck Circumference: 18"   FINDINGS:   Sleep Summary:    Total recording time amounted to 10 hours and 10 minutes of which sleep time was calculated to amount to 8 hours and 52 minutes.         Percent REM (%):   22.1%                                     Respiratory Indices:   Calculated pAHI (per hour):    27.4/h                        The REM AHI was 31.9/h versus a non-REM AHI of 26.3/h.                                                                     Positional AHI:   In supine sleep the AHI was 25.1/h. and AHI on the left side 43.6/h. The patient only slept 37 minutes on the left side. Snoring volume was at  41 dB mean volume.  This is a moderate degree of snoring and did accompany at 18.3% of the total sleep time.  Please note that the RDI and the AHI were the same.                                                Oxygen Saturation Statistics:   O2 Saturation Range (%): Oxygen saturation varied between a nadir at 74% and a maximum of 100% with a mean oxygen saturation of 96%.  Only 8.1 minutes of total sleep time was found to be doing hypoxia, this equals 1.5% of sleep time.  O2 Saturation (minutes) <89%:   8.1 minutes        Pulse Rate Statistics:       Pulse Range:   Between 4419 bpm with a mean heart rate of 80 bpm.  There were several outliers of bradycardia and tachycardia during REM sleep. These were not motion artifacts.  Oxygen desaturation was brief but frequently seen during REM sleep.              IMPRESSION:  This HST confirms the presence of obstructive sleep apnea at a moderate degree with an AHI of 27.4, marginal REM sleep accentuation.  Hypoxia however was frequently seen for brief periods of time during REM sleep.  Snoring was of average volume.   RECOMMENDATION: The degree of apnea and snoring should be responsive to a treatment by positive airway pressure.  I would like for the patient to also establish regular sleep times which will help to achieve the compliance required for successful CPAP treatment.   I like for the patient to start with an auto titrating CPAP device settings between 5 and 16 cmH2O pressure, 2 cm EPR, heated humidification for machine and tubing, and a mask of the patient's choice to be fitted in a reclined position in a face-to-face visit.   Revisit with 30 to 90 days of initiation of CPAP therapy.  Please advise the patient that he can change the mask and interface within 30 days at no cost to him.   I encouraged the patient to not watch TV in bed, and that for insomnia treatment he may need to try nonaddictive nutritional  supplements melatonin, valerian. Set a bed time and rise time.    PS : Please note that trazodone was not prescribed given that he had been on Wellbutrin and Cymbalta at the time I saw him.   INTERPRETING PHYSICIAN:    Larey Seat, MD  Board Certified [facility]    Medical Director of [facility] at [facility].

## 2021-08-05 ENCOUNTER — Telehealth: Payer: Self-pay | Admitting: Neurology

## 2021-08-05 NOTE — Telephone Encounter (Signed)
I called pt. I advised pt that Dr. Brett Fairy reviewed their sleep study results and found that pt has moderate to severe osa. Dr. Brett Fairy recommends that pt starts auto CPAP. I reviewed PAP compliance expectations with the pt. Pt is agreeable to starting a CPAP. I advised pt that an order will be sent to a DME, [facility], and [facility] will call the pt within about one week after they file with the pt's insurance. [facility]  will show the pt how to use the machine, fit for masks, and troubleshoot the CPAP if needed. A follow up appt was made for insurance purposes with Hedwig Morton, NP at 11:45 am on 11/11/21. Pt verbalized understanding to arrive 15 minutes early and bring their CPAP. A letter with all of this information in it will be mailed to the pt as a reminder. I verified with the pt that the address we have on file is correct. Pt verbalized understanding of results. Pt had no questions at this time but was encouraged to call back if questions arise. I have sent the order to [facility] and have received confirmation that they have received the order.

## 2021-08-05 NOTE — Telephone Encounter (Signed)
-----   Message from Larey Seat, MD sent at 08/04/2021 12:56 PM EST ----- IMPRESSION: This HST confirms the presence of obstructive sleep apnea at a moderate degree with an AHI of 27.4, marginal REM sleep accentuation. Hypoxia however was frequently seen for brief periods of time during REM sleep. Snoring was of average volume.  RECOMMENDATION:The degree of apnea and snoring should be responsive to a treatment by positive airway pressure. I would like for the patient to also establish regular sleep times which will help to achieve the compliance required for successful CPAP treatment.  I like for the patient to start with an auto titrating CPAP device settings between 5 and 16 cmH2O pressure, 2 cm EPR, heated humidification for machine and tubing, and a mask of the patient's choice to be fitted in a reclined position in a face-to-face visit.  Revisit with 30 to 90 days of initiation of CPAP therapy. Please advise the patient that he can change the mask and interface within 30 days at no cost to him.

## 2021-09-16 ENCOUNTER — Telehealth: Payer: Self-pay

## 2021-09-16 NOTE — Telephone Encounter (Signed)
Results were sent from [facility] reporting that the Hepatitis C test result returned as non-reactive. This test was performed on 09/09/2021.

## 2021-09-17 DIAGNOSIS — H401131 Primary open-angle glaucoma, bilateral, mild stage: Secondary | ICD-10-CM | POA: Diagnosis not present

## 2021-09-22 DIAGNOSIS — G4733 Obstructive sleep apnea (adult) (pediatric): Secondary | ICD-10-CM | POA: Diagnosis not present

## 2021-10-05 ENCOUNTER — Other Ambulatory Visit: Payer: Self-pay | Admitting: Adult Health

## 2021-10-05 DIAGNOSIS — Z794 Long term (current) use of insulin: Secondary | ICD-10-CM

## 2021-10-05 DIAGNOSIS — E119 Type 2 diabetes mellitus without complications: Secondary | ICD-10-CM

## 2021-10-10 DIAGNOSIS — E349 Endocrine disorder, unspecified: Secondary | ICD-10-CM | POA: Diagnosis not present

## 2021-10-17 ENCOUNTER — Ambulatory Visit: Payer: Medicare Other | Admitting: Internal Medicine

## 2021-10-17 DIAGNOSIS — E230 Hypopituitarism: Secondary | ICD-10-CM | POA: Diagnosis not present

## 2021-10-21 NOTE — Progress Notes (Signed)
Future Appointments  Date Time Provider Department  10/22/2021            6 mo OV  10:30 AM [name], MD [facility]  11/11/2021 11:45 AM Ward Givens, NP [facility]  04/16/2022            CPE  10:00 AM [name], MD [facility]  07/20/2022         Wellness 10:00 AM Magda Bernheim, NP [facility]    History of Present Illness:       This very nice 76 y.o.  MBM presents for 6  month follow up with HTN, HLD, T2_IDDM, and Vitamin D Deficiency.  Abd CT scan in 2015 showed Aortic Atherosclerosis. Patient has GERD controlled on his diet & meds. He also has hx/Prostate treated by XRT in 2018.       Patient is treated for HTN  (1991) & BP has been controlled at home.  In 2013, he presented with ACS & had PTCA /Stents. Todays BP is at goal - 136/82.  In 2013, patient had PCA/Stent by DrSkains.  In 2020, Stress Lexiscan was Negative. Patient has had no complaints of any cardiac type chest pain, palpitations, dyspnea / orthopnea / PND, dizziness, claudication, or dependent edema.       Hyperlipidemia is controlled with diet & Atorvastatin. Patient denies myalgias or other med SEs. Last Lipids were at goal :  Lab Results  Component Value Date   CHOL 125 07/21/2021   HDL 45 07/21/2021   LDLCALC 57 07/21/2021   LDLDIRECT 45.7 10/30/2013   TRIG 147 07/21/2021   CHOLHDL 2.8 07/21/2021     Also, the patient has history of T2_IDDM (1998) w/CKD2  (GFR 78) treated inially with Metformin and added insulin in 2016.   Patient has had no symptoms of reactive hypoglycemia, diabetic polys, paresthesias or visual blurring.  Last A1c was not at goal :  Lab Results  Component Value Date   HGBA1C 6.7 (H) 07/21/2021                                                         Further, the patient also has history of Vitamin D Deficiency ("15" /2008) and supplements vitamin D without any suspected side-effects. Last vitamin D was sl elevated :    Lab Results  Component Value Date   VD25OH 113 (H)  04/16/2021     Current Outpatient Medications on File Prior to Visit  Medication Sig   VITAMIN C  100 MG tablet Take 1 tablet Daily  aspirin EC 81 MG tablet Take 1 tab Daily    Atorvastatin 10 MG tablet Take daily    buPROPion XL 300 MG 24 hr tablet Take  1 tablet  Daily    VITAMIN D  5000 u Take 10,000 Units daily.    COLACE  100 MG capsule Take 1 capsule 2 times daily.   DULoxetine 60 MG capsule TAKE 1 CAPSULE DAILY    furosemide  40 MG tablet Take 1 tablet 2 x /day as needed  for Fluid Retention   Gabapentin 800 MG tablet  3 TO 4 TIMES DAILY    glycopyrrolate (ROBINUL) 2 MG  Take 1 tablet 3 x /day excessive sweating   HUMULIN 70/30 INJECT  [place] 50  UNITS TWICE DAILY   XALATAN 0.005 % ophth soln Place 1 drop into both eyes at bedtime.   Magnesium 250 MG TABS Take  daily.    metFORMIN-XR 500 MG  Take 2 tablets 2 x /day with Meals    Metoprolol succinate  - XL 25 mg   Take 1/2 tab ( 12.5 mg ) Daily    montelukast 10 MG tablet TAKE 1 TABLET DAILY FOR ALLERGIES   Multiple Vitamin  Take 1 tablet  daily.   NITROSTAT 0.4 MG SL tablet AS NEEDED FOR CHEST PAIN   pantoprazole 40 MG tablet TAKE 1 TABLET   DAILY    SYSTANE  SOLN Place 2 drops into both eyes daily as needed    tamsulosin (FLOMAX) 0.4 MG CAPS capsule Take 0.4 mg by mouth at bedtime.   Vitamin E 400 units TABS Take by mouth. Taking 800 IU a day   Zinc 30 MG TABS Take 30 mg by mouth daily.    No Active Allergies   PMHx:   Past Medical History:  Diagnosis  Date   Arthritis    CAD (coronary artery disease)    a.s/p DES to mid LAD and OM2 06/2012.   Chronic back pain    Chronically dry eyes    Diabetes mellitus without complication ([place])    TYPE 2    DKA (diabetic ketoacidoses) 01/30/2017   Elevated PSA    being monitored   Fatty liver    GERD (gastroesophageal reflux disease)    Hx of radiation therapy    prostate , [facility]    Hypertension    Hypertriglyceridemia    Intermediate coronary syndrome ([place]) 07/26/2012   Prostate cancer ([place])  RADIATION          Immunization History  Administered Date(s) Administered   Influenza Split 06/26/2012   Influenza, High Dose  06/14/2017, 06/14/2019, 07/01/2020, 07/21/2021   Influenza 06/14/2013, 06/25/2014, 06/03/2018   PFIZER SARS-COV-2 Vacc 10/28/2019, 11/22/2019, 06/26/2020   Pneumococcal -13 04/23/2016   Pneumococcal -23 06/14/2017   Pneumococcal -23 06/16/2010   Td 09/17/2003   Tdap 11/26/2013   Zoster, Live 07/12/2012     Past Surgical History:  Procedure Laterality Date   APPENDECTOMY     CARDIAC SURGERY  ~2017   3 stent placed.   COLONOSCOPY  02/12/2010   [facility]   LEFT HEART CATHETERIZATION WITH CORONARY ANGIOGRAM N/A 06/27/2012   Procedure: LEFT HEART CATHETERIZATION WITH CORONARY ANGIOGRAM;  Surgeon: Candee Furbish, MD;  Location: [facility] CATH LAB;  Service: Cardiovascular;  Laterality: N/A;   LEFT HEART CATHETERIZATION WITH CORONARY ANGIOGRAM N/A 07/25/2012   Procedure: LEFT HEART CATHETERIZATION WITH CORONARY ANGIOGRAM;  Surgeon: Sueanne Margarita, MD;  Location: [facility] CATH LAB;  Service: Cardiovascular;  Laterality: N/A;  PROSTATE BIOPSY     RADIOLOGY WITH ANESTHESIA N/A 12/03/2015   Procedure: MRI LUMBAR SPINE;  Surgeon: Medication Radiologist, MD;  Location: [facility];  Service: Radiology;  Laterality: N/A;   TOTAL HIP ARTHROPLASTY Right 04/28/2019   Procedure: TOTAL HIP ARTHROPLASTY ANTERIOR APPROACH;  Surgeon: Dorna Leitz, MD;  Location: [facility] ORS;  Service:  Orthopedics;  Laterality: Right;    FHx:    Reviewed / unchanged  SHx:    Reviewed / unchanged   Systems Review:  Constitutional: Denies fever, chills, wt changes, headaches, insomnia, fatigue, night sweats, change in appetite. Eyes: Denies redness, blurred vision, diplopia, discharge, itchy, watery eyes.  ENT: Denies discharge, congestion, post nasal drip, epistaxis, sore throat, earache, hearing loss, dental pain, tinnitus, vertigo, sinus pain, snoring.  CV: Denies chest pain, palpitations, irregular heartbeat, syncope, dyspnea, diaphoresis, orthopnea, PND, claudication or edema. Respiratory: denies cough, dyspnea, DOE, pleurisy, hoarseness, laryngitis, wheezing.  Gastrointestinal: Denies dysphagia, odynophagia, heartburn, reflux, water brash, abdominal pain or cramps, nausea, vomiting, bloating, diarrhea, constipation, hematemesis, melena, hematochezia  or hemorrhoids. Genitourinary: Denies dysuria, frequency, urgency, nocturia, hesitancy, discharge, hematuria or flank pain. Musculoskeletal: Denies arthralgias, myalgias, stiffness, jt. swelling, pain, limping or strain/sprain.  Skin: Denies pruritus, rash, hives, warts, acne, eczema or change in skin lesion(s). Neuro: No weakness, tremor, incoordination, spasms, paresthesia or pain. Psychiatric: Denies confusion, memory loss or sensory loss. Endo: Denies change in weight, skin or hair change.  Heme/Lymph: No excessive bleeding, bruising or enlarged lymph nodes.  Physical Exam  BP 136/82    Pulse 82    Temp (!) 95.6 F (35.3 C)    Resp 16    Ht 5\' 9"  (1.753 m)    Wt 218 lb 12.8 oz (99.2 kg)    SpO2 99%    BMI 32.31 kg/m   Appears  well nourished, well groomed  and in no distress.  Eyes: PERRLA, EOMs, conjunctiva no swelling or erythema. Sinuses: No frontal/maxillary tenderness ENT/Mouth: EAC's clear, TM's nl w/o erythema, bulging. Nares clear w/o erythema, swelling, exudates. Oropharynx clear without erythema or exudates. Oral  hygiene is good. Tongue normal, non obstructing. Hearing intact.  Neck: Supple. Thyroid not palpable. Car 2+/2+ without bruits, nodes or JVD. Chest: Respirations nl with BS clear & equal w/o rales, rhonchi, wheezing or stridor.  Cor: Heart sounds normal w/ regular rate and rhythm without sig. murmurs, gallops, clicks or rubs. Peripheral pulses normal and equal  without edema.  Abdomen: Soft & bowel sounds normal. Non-tender w/o guarding, rebound, hernias, masses or organomegaly.  Lymphatics: Unremarkable.  Musculoskeletal: Full ROM all peripheral extremities, joint stability, 5/5 strength and normal gait.  Skin: Warm, dry without exposed rashes, lesions or ecchymosis apparent.  Neuro: Cranial nerves intact, reflexes equal bilaterally. Sensory-motor testing grossly intact. Tendon reflexes grossly intact.  Pysch: Alert & oriented x 3.  Insight and judgement nl & appropriate. No ideations.  Assessment and Plan:  1. Essential hypertension  - Continue medication, monitor blood pressure at home.  - Continue DASH diet.  Reminder to go to the ER if any CP,  SOB, nausea, dizziness, severe HA, changes vision/speech.    - CBC with Differential/Platelet - COMPLETE METABOLIC PANEL WITH GFR - Magnesium - TSH  2. Hyperlipidemia associated with type 2 diabetes mellitus ([place])  - Continue diet/meds, exercise,& lifestyle modifications.  - Continue monitor periodic cholesterol/liver & renal functions     - Lipid panel - TSH  3. Type 2 diabetes mellitus with stage 2 chronic kidney  disease, with long-term current use  of insulin ([place])  - Continue diet, exercise  - Lifestyle modifications.  - Monitor appropriate labs   - Hemoglobin A1c  4. Vitamin D deficiency  - Continue supplementation   - VITAMIN D 25 Hydroxy   5. Aortic atherosclerosis ([place]) by Abd CT scan 2015  - Lipid panel  6. Medication management  - CBC with Differential/Platelet - COMPLETE METABOLIC PANEL WITH GFR -  Magnesium - Lipid panel - TSH - Hemoglobin A1c - VITAMIN D 25 Hydroxy         Discussed  regular exercise, BP monitoring, weight control to achieve/maintain BMI less than 25 and discussed med and SE's. Recommended labs to assess and monitor clinical status with further disposition pending results of labs.  I discussed the assessment and treatment plan with the patient. The patient was provided an opportunity to ask questions and all were answered. The patient agreed with the plan and demonstrated an understanding of the instructions.  I provided over 30 minutes of exam, counseling, chart review and  complex critical decision making.        The patient was advised to call back or seek an in-person evaluation if the symptoms worsen or if the condition fails to improve as anticipated.   Kirtland Bouchard, MD

## 2021-10-22 ENCOUNTER — Encounter: Payer: Self-pay | Admitting: Internal Medicine

## 2021-10-22 ENCOUNTER — Other Ambulatory Visit: Payer: Self-pay

## 2021-10-22 ENCOUNTER — Ambulatory Visit (INDEPENDENT_AMBULATORY_CARE_PROVIDER_SITE_OTHER): Payer: Medicare Other | Admitting: Internal Medicine

## 2021-10-22 ENCOUNTER — Other Ambulatory Visit: Payer: Self-pay | Admitting: Internal Medicine

## 2021-10-22 VITALS — BP 136/82 | HR 82 | Temp 95.6°F | Resp 16 | Ht 69.0 in | Wt 218.8 lb

## 2021-10-22 DIAGNOSIS — E1169 Type 2 diabetes mellitus with other specified complication: Secondary | ICD-10-CM | POA: Diagnosis not present

## 2021-10-22 DIAGNOSIS — Z794 Long term (current) use of insulin: Secondary | ICD-10-CM | POA: Diagnosis not present

## 2021-10-22 DIAGNOSIS — E559 Vitamin D deficiency, unspecified: Secondary | ICD-10-CM | POA: Diagnosis not present

## 2021-10-22 DIAGNOSIS — R61 Generalized hyperhidrosis: Secondary | ICD-10-CM

## 2021-10-22 DIAGNOSIS — N182 Chronic kidney disease, stage 2 (mild): Secondary | ICD-10-CM | POA: Diagnosis not present

## 2021-10-22 DIAGNOSIS — E1122 Type 2 diabetes mellitus with diabetic chronic kidney disease: Secondary | ICD-10-CM | POA: Diagnosis not present

## 2021-10-22 DIAGNOSIS — Z79899 Other long term (current) drug therapy: Secondary | ICD-10-CM

## 2021-10-22 DIAGNOSIS — I700 Atherosclerosis of aorta: Secondary | ICD-10-CM | POA: Diagnosis not present

## 2021-10-22 DIAGNOSIS — I10 Essential (primary) hypertension: Secondary | ICD-10-CM

## 2021-10-22 DIAGNOSIS — E785 Hyperlipidemia, unspecified: Secondary | ICD-10-CM | POA: Diagnosis not present

## 2021-10-22 MED ORDER — GLYCOPYRROLATE 2 MG PO TABS: ORAL_TABLET | ORAL | 0 refills | Status: DC

## 2021-10-22 NOTE — Patient Instructions (Signed)

## 2021-10-23 DIAGNOSIS — G4733 Obstructive sleep apnea (adult) (pediatric): Secondary | ICD-10-CM | POA: Diagnosis not present

## 2021-10-23 LAB — TSH: TSH: 0.67 mIU/L (ref 0.40–4.50)

## 2021-10-23 LAB — COMPLETE METABOLIC PANEL WITH GFR
AG Ratio: 1.8 (calc) (ref 1.0–2.5)
ALT: 40 U/L (ref 9–46)
AST: 36 U/L — ABNORMAL HIGH (ref 10–35)
Albumin: 4.3 g/dL (ref 3.6–5.1)
Alkaline phosphatase (APISO): 98 U/L (ref 35–144)
BUN/Creatinine Ratio: 10 (calc) (ref 6–22)
BUN: 14 mg/dL (ref 7–25)
CO2: 26 mmol/L (ref 20–32)
Calcium: 9.9 mg/dL (ref 8.6–10.3)
Chloride: 105 mmol/L (ref 98–110)
Creat: 1.4 mg/dL — ABNORMAL HIGH (ref 0.70–1.28)
Globulin: 2.4 g/dL (calc) (ref 1.9–3.7)
Glucose, Bld: 211 mg/dL — ABNORMAL HIGH (ref 65–99)
Potassium: 4.4 mmol/L (ref 3.5–5.3)
Sodium: 139 mmol/L (ref 135–146)
Total Bilirubin: 0.3 mg/dL (ref 0.2–1.2)
Total Protein: 6.7 g/dL (ref 6.1–8.1)
eGFR: 53 mL/min/{1.73_m2} — ABNORMAL LOW (ref >=60)

## 2021-10-23 LAB — CBC WITH DIFFERENTIAL/PLATELET
Absolute Monocytes: 577 cells/uL (ref 200–950)
Basophils Absolute: 52 cells/uL (ref 0–200)
Basophils Relative: 1 %
Eosinophils Absolute: 151 cells/uL (ref 15–500)
Eosinophils Relative: 2.9 %
HCT: 38.3 % — ABNORMAL LOW (ref 38.5–50.0)
Hemoglobin: 12.7 g/dL — ABNORMAL LOW (ref 13.2–17.1)
Lymphs Abs: 1570 cells/uL (ref 850–3900)
MCH: 30 pg (ref 27.0–33.0)
MCHC: 33.2 g/dL (ref 32.0–36.0)
MCV: 90.3 fL (ref 80.0–100.0)
MPV: 12.4 fL (ref 7.5–12.5)
Monocytes Relative: 11.1 %
Neutro Abs: 2850 cells/uL (ref 1500–7800)
Neutrophils Relative %: 54.8 %
Platelets: 144 10*3/uL (ref 140–400)
RBC: 4.24 10*6/uL (ref 4.20–5.80)
RDW: 12.7 % (ref 11.0–15.0)
Total Lymphocyte: 30.2 %
WBC: 5.2 10*3/uL (ref 3.8–10.8)

## 2021-10-23 LAB — LIPID PANEL
Cholesterol: 142 mg/dL (ref <200)
HDL: 45 mg/dL (ref >=40)
LDL Cholesterol (Calc): 65 mg/dL (calc)
Non-HDL Cholesterol (Calc): 97 mg/dL (calc) (ref <130)
Total CHOL/HDL Ratio: 3.2 (calc) (ref <5.0)
Triglycerides: 276 mg/dL — ABNORMAL HIGH (ref <150)

## 2021-10-23 LAB — MAGNESIUM: Magnesium: 1.8 mg/dL (ref 1.5–2.5)

## 2021-10-23 LAB — HEMOGLOBIN A1C
Hgb A1c MFr Bld: 8.3 % of total Hgb — ABNORMAL HIGH (ref <5.7)
Mean Plasma Glucose: 192 mg/dL
eAG (mmol/L): 10.6 mmol/L

## 2021-10-23 LAB — VITAMIN D 25 HYDROXY (VIT D DEFICIENCY, FRACTURES): Vit D, 25-Hydroxy: 54 ng/mL (ref 30–100)

## 2021-10-23 NOTE — Progress Notes (Signed)
=============================================================== ===============================================================  -  Glucose = 211 mg%   - Too High   - and A1c worse - has gone up from 6.7%   to Now   8.3%  - way too high  !  - Your need to work harder on your Diet &  Weight Loss    -   Being diabetic has a  300% increased risk for heart attack,  stroke, cancer, and alzheimer- type vascular dementia.   -   It is very important that you work harder with diet by  avoiding all foods that are white except chicken,   fish & calliflower.  - Avoid white rice  (brown & wild rice is OK),   - Avoid white potatoes  (sweet potatoes in moderation is OK),   White bread or wheat bread or anything made out of   white flour like bagels, donuts, rolls, buns, biscuits, cakes,  - pastries, cookies, pizza crust, and pasta (made from  white flour & egg whites)   - vegetarian pasta or spinach or wheat pasta is OK.  - Multigrain breads like Arnold's, Pepperidge Farm or   multigrain sandwich thins or high fiber breads like   [place] bread or "Dave's Killer" breads that are  4 to 5 grams fiber per slice !  are best.    Diet, exercise and weight loss can reverse and cure  diabetes in the early stages.    - Diet, exercise and weight loss is very important in the   control and prevention of complications of diabetes which  affects every system in your body, ie.   -Brain - dementia/stroke,  - eyes - glaucoma/blindness,  - heart - heart attack/heart failure,  - kidneys - dialysis,  - stomach - gastric paralysis,  - intestines - malabsorption,  - nerves - severe painful neuritis,  - circulation - gangrene & loss of a leg(s)  - and finally  . . . . . . . . . . . . . . . . . .    - cancer and Alzheimers. =============================================================== ===============================================================  -  Total Chol = 142   &  LDL Chol = 65   -  Both Excellent   - But Triglycerides (  276     ) or fats in blood are too high                (  Ideal or Goal is less than 150)    - Recommend avoid fried & greasy foods,  sweets / candy,   - Avoid white rice  (brown or wild rice or Quinoa is OK),   - Avoid white potatoes  (sweet potatoes are OK)   - Avoid anything made from white flour  - bagels, doughnuts, rolls, buns, biscuits, white and   wheat breads, pizza crust and traditional  pasta made of white flour & egg white  - (vegetarian pasta or spinach or wheat pasta is OK).    - Multi-grain bread is OK - like multi-grain flat bread or  sandwich thins.   - Avoid alcohol in excess.   - Exercise is also important. =============================================================== ===============================================================  -   Magnesium  -   1.8   -  very  low- goal is betw 2.0 - 2.5,   - So..............Marland Kitchen  Recommend that you take  Magnesium 500 mg tablet 2 x /daily   (   Magnesium will also help with your constipation  !   )   -  also important to eat lots of  leafy green vegetables   - spinach - Kale - collards - greens - okra - asparagus  - broccoli - quinoa - squash - almonds   - black, red, white beans  -  peas - green beans =============================================================== ===============================================================  -  Vitamin D = 54 - Low    - Vitamin D goal is between 70-100.     - Please make sure that you are taking your 10,000 units Vitamin D daily     - It is very important as a natural anti-inflammatory and helping the  immune system protect against viral infections, like the Covid-19    helping hair, skin, and nails, as well as reducing stroke and  heart attack risk.   - It helps your bones and helps with mood.  - It also decreases numerous cancer risks so please  take it as directed.   - Low Vit D is associated with a 200-300%  higher risk for  CANCER   and 200-300% higher risk for HEART   ATTACK  &  STROKE.    - It is also associated with higher death rate at younger ages,   autoimmune diseases like Rheumatoid arthritis, Lupus,  Multiple Sclerosis.     - Also many other serious conditions, like depression, Alzheimer's  Dementia, infertility, muscle aches, fatigue, fibromyalgia   - just to name a few. ==========================================================  - All Else - CBC - Kidneys - Electrolytes - Liver - Magnesium & Thyroid    - all  Normal / OK ===========================================================

## 2021-10-29 ENCOUNTER — Other Ambulatory Visit: Payer: Self-pay | Admitting: Internal Medicine

## 2021-10-29 DIAGNOSIS — R61 Generalized hyperhidrosis: Secondary | ICD-10-CM

## 2021-10-29 MED ORDER — GLYCOPYRROLATE 2 MG PO TABS: ORAL_TABLET | ORAL | 11 refills | Status: DC

## 2021-11-11 ENCOUNTER — Ambulatory Visit: Payer: Medicare Other | Admitting: Adult Health

## 2021-11-11 ENCOUNTER — Encounter: Payer: Self-pay | Admitting: Adult Health

## 2021-11-11 VITALS — BP 119/79 | HR 71 | Ht 70.0 in | Wt 226.8 lb

## 2021-11-11 DIAGNOSIS — G4733 Obstructive sleep apnea (adult) (pediatric): Secondary | ICD-10-CM

## 2021-11-11 DIAGNOSIS — Z9989 Dependence on other enabling machines and devices: Secondary | ICD-10-CM | POA: Diagnosis not present

## 2021-11-11 NOTE — Progress Notes (Signed)
PATIENT: Michael Guzman DOB: 28-Feb-1946  REASON FOR VISIT: follow up HISTORY FROM: patient PRIMARY NEUROLOGIST: Dr. Brett Fairy  HISTORY OF PRESENT ILLNESS: Today 11/11/21:  [name] is a 76 year old male with a history of obstructive sleep apnea on CPAP.  He returns today for follow-up.  His CPAP download indicates that he only uses machine 9 out of the last 30 days for compliance of 30%.  He reports that he has not been using the machine consistently because his wife has been in the hospital.  He states that he also sweats a lot at night and finds it hard to use the mask.  Reports that he is already called his DME company and they have turned off his heated tubing and turn down his humidity.  The patient does want to use the CPAP.  Not interested in inspire device    REVIEW OF SYSTEMS: Out of a complete 14 system review of symptoms, the patient complains only of the following symptoms, and all other reviewed systems are negative.  ESS 15  ALLERGIES: No Active Allergies  HOME MEDICATIONS: Outpatient Medications Prior to Visit  Medication Sig Dispense Refill   Ascorbic Acid (VITAMIN C) 100 MG tablet Take 100 mg by mouth daily.     aspirin EC 81 MG tablet Take 1 tablet (81 mg total) by mouth daily. 90 tablet 3   atorvastatin (LIPITOR) 10 MG tablet Take 1 tablet (10 mg total) by mouth daily. Take 1 tablet (10 mg total) by mouth daily. 90 tablet 3   Blood Glucose Monitoring Suppl (ONE TOUCH ULTRA SYSTEM KIT) w/Device KIT Check blood sugar 1 time daily-DX-E11.22 1 each 0   buPROPion (WELLBUTRIN XL) 300 MG 24 hr tablet Take  1 tablet  Daily  for Mood, Focus & Concentration / Patient knows to take by mouth 90 tablet 3   Cholecalciferol (VITAMIN D3) 125 MCG (5000 UT) CAPS Take 10,000 Units by mouth daily.      docusate sodium (COLACE) 100 MG capsule Take 1 capsule (100 mg total) by mouth 2 (two) times daily. 30 capsule 0   furosemide (LASIX) 40 MG tablet Take 1 tablet 2 x /day as  needed  for Fluid Retention 180 tablet 3   gabapentin (NEURONTIN) 800 MG tablet TAKE 1 TABLET BY MOUTH 3 TO 4 TIMES DAILY FOR PAIN, HOT FLASHES AND SWEATS 360 tablet 3   glucose blood (ONETOUCH ULTRA) test strip CHECK BLOOD SUGAR 3 TIMES  DAILY (Dx: e11.29) 300 strip 3   glycopyrrolate (ROBINUL) 2 MG tablet Take  1 tablet  3 x /day  for Excessive Sweating 90 tablet 11   insulin NPH-regular Human (HUMULIN 70/30) (70-30) 100 UNIT/ML injection INJECT SUBCUTANEOUSLY 50  UNITS TWICE DAILY 90 mL 3   Insulin Syringes, Disposable, U-100 1 ML MISC 50 Units by Does not apply route 2 (two) times daily. inject 50 units into skin 2 x /day 200 each 3   Lancets (ONETOUCH DELICA PLUS [id number]) MISC CHECK BLOOD SUGAR 3 TIMES  DAILY 300 each 3   latanoprost (XALATAN) 0.005 % ophthalmic solution Place 1 drop into both eyes at bedtime.     Magnesium 250 MG TABS Take 250 mg by mouth daily.      metFORMIN (GLUCOPHAGE-XR) 500 MG 24 hr tablet TAKE 2 TABLETS BY MOUTH  TWICE DAILY WITH MEALS FOR  DIABETES 360 tablet 3   metoprolol succinate (TOPROL-XL) 25 MG 24 hr tablet TAKE ONE-HALF TABLET BY  MOUTH DAILY 45 tablet 3  montelukast (SINGULAIR) 10 MG tablet TAKE 1 TABLET BY MOUTH  DAILY FOR ALLERGIES 90 tablet 3   Multiple Vitamin (MULTIVITAMIN WITH MINERALS) TABS tablet Take 1 tablet by mouth daily.     nitroGLYCERIN (NITROSTAT) 0.4 MG SL tablet PLACE 1 TABLET UNDER THE TONGUE EVERY 5 MINUTES AS NEEDED FOR CHEST PAIN 25 tablet 6   pantoprazole (PROTONIX) 40 MG tablet TAKE 1 TABLET BY MOUTH  DAILY TO PREVENT HEARTBURN  AND INDIGESTION 90 tablet 3   Polyethyl Glycol-Propyl Glycol (SYSTANE) 0.4-0.3 % SOLN Place 2 drops into both eyes daily as needed (for dry eyes).     tamsulosin (FLOMAX) 0.4 MG CAPS capsule Take 0.4 mg by mouth at bedtime.     Vitamin E 400 units TABS Take by mouth. Taking 800 IU a day     Zinc 30 MG TABS Take 30 mg by mouth daily.     No facility-administered medications prior to visit.    PAST  MEDICAL HISTORY: Past Medical History:  Diagnosis Date   Arthritis    CAD (coronary artery disease)    a.s/p DES to mid LAD and OM2 06/2012.   Chronic back pain    Chronically dry eyes    Diabetes mellitus without complication (HCC)    TYPE 2    DKA (diabetic ketoacidoses) 01/30/2017   Elevated PSA    being monitored   Fatty liver    GERD (gastroesophageal reflux disease)    Hx of radiation therapy    prostate , [facility]    Hypertension    Hypertriglyceridemia    Intermediate coronary syndrome ([place]) 07/26/2012   Prostate cancer ([place])    RADTIATION     PAST SURGICAL HISTORY: Past Surgical History:  Procedure Laterality Date   APPENDECTOMY     CARDIAC SURGERY  ~2017   3 stent placed.   COLONOSCOPY  02/12/2010   Buccini   LEFT HEART CATHETERIZATION WITH CORONARY ANGIOGRAM N/A 06/27/2012   Procedure: LEFT HEART CATHETERIZATION WITH CORONARY ANGIOGRAM;  Surgeon: Candee Furbish, MD;  Location: [facility] CATH LAB;  Service: Cardiovascular;  Laterality: N/A;   LEFT HEART CATHETERIZATION WITH CORONARY ANGIOGRAM N/A 07/25/2012   Procedure: LEFT HEART CATHETERIZATION WITH CORONARY ANGIOGRAM;  Surgeon: Sueanne Margarita, MD;  Location: [facility] CATH LAB;  Service: Cardiovascular;  Laterality: N/A;   PROSTATE BIOPSY     RADIOLOGY WITH ANESTHESIA N/A 12/03/2015   Procedure: MRI LUMBAR SPINE;  Surgeon: Medication Radiologist, MD;  Location: [facility];  Service: Radiology;  Laterality: N/A;   TOTAL HIP ARTHROPLASTY Right 04/28/2019   Procedure: TOTAL HIP ARTHROPLASTY ANTERIOR APPROACH;  Surgeon: Dorna Leitz, MD;  Location: [facility] ORS;  Service: Orthopedics;  Laterality: Right;    FAMILY HISTORY: Family History  Problem Relation Age of Onset   Diabetes Sister     SOCIAL HISTORY: Social History   Socioeconomic History   Marital status: Married    Spouse name: Not on file   Number of children: 4   Years of education: 2 y colleg   Highest education level: Not on file  Occupational History    Occupation: SUPERVISOR    Employer: [employer]  Tobacco Use   Smoking status: Former    Packs/day: 0.75    Years: 50.00    Pack years: 37.50    Types: Cigarettes    Start date: 74    Quit date: 07/27/2011    Years since quitting: 10.3   Smokeless tobacco: Never  Substance and Sexual Activity   Alcohol use:  Not Currently    Comment: qut 20 years ago per patient    Drug use: No   Sexual activity: Not on file  Other Topics Concern   Not on file  Social History Narrative   Works as    Scientist, physiological Strain: Not on file  Food Insecurity: Not on file  Transportation Needs: Not on file  Physical Activity: Not on file  Stress: Not on file  Social Connections: Not on file  Intimate Partner Violence: Not on file      PHYSICAL EXAM  Vitals:   11/11/21 1150  BP: 119/79  Pulse: 71  SpO2: 97%  Weight: 226 lb 12.8 oz (102.9 kg)  Height: 5' 10"  (1.778 m)   Body mass index is 32.54 kg/m.  Generalized: Well developed, in no acute distress  Chest: Lungs clear to auscultation bilaterally  Neurological examination  Mentation: Alert oriented to time, place, history taking. Follows all commands speech and language fluent Cranial nerve II-XII: Extraocular movements were full, visual field were full on confrontational test Head turning and shoulder shrug  were normal and symmetric. Motor: The motor testing reveals 5 over 5 strength of all 4 extremities. Good symmetric motor tone is noted throughout.  Sensory: Sensory testing is intact to soft touch on all 4 extremities. No evidence of extinction is noted.  Gait and station: Gait is normal.    DIAGNOSTIC DATA (LABS, IMAGING, TESTING) - I reviewed patient records, labs, notes, testing and imaging myself where available.  Lab Results  Component Value Date   WBC 5.2 10/22/2021   HGB 12.7 (L) 10/22/2021   HCT 38.3 (L) 10/22/2021   MCV 90.3 10/22/2021   PLT 144 10/22/2021       Component Value Date/Time   NA 139 10/22/2021 1025   K 4.4 10/22/2021 1025   CL 105 10/22/2021 1025   CO2 26 10/22/2021 1025   GLUCOSE 211 (H) 10/22/2021 1025   BUN 14 10/22/2021 1025   CREATININE 1.40 (H) 10/22/2021 1025   CALCIUM 9.9 10/22/2021 1025   PROT 6.7 10/22/2021 1025   ALBUMIN 4.1 01/11/2021 1433   AST 36 (H) 10/22/2021 1025   ALT 40 10/22/2021 1025   ALKPHOS 55 01/11/2021 1433   BILITOT 0.3 10/22/2021 1025   GFRNONAA 68 01/21/2021 0923   GFRAA 78 01/21/2021 0923   Lab Results  Component Value Date   CHOL 142 10/22/2021   HDL 45 10/22/2021   LDLCALC 65 10/22/2021   LDLDIRECT 45.7 10/30/2013   TRIG 276 (H) 10/22/2021   CHOLHDL 3.2 10/22/2021   Lab Results  Component Value Date   HGBA1C 8.3 (H) 10/22/2021   Lab Results  Component Value Date   VITAMINB12 703 12/19/2019   Lab Results  Component Value Date   TSH 0.67 10/22/2021      ASSESSMENT AND PLAN 76 y.o. year old male  has a past medical history of Arthritis, CAD (coronary artery disease), Chronic back pain, Chronically dry eyes, Diabetes mellitus without complication ([place]), DKA (diabetic ketoacidoses) (01/30/2017), Elevated PSA, Fatty liver, GERD (gastroesophageal reflux disease), radiation therapy, Hypertension, Hypertriglyceridemia, Intermediate coronary syndrome ([place]) (07/26/2012), and Prostate cancer ([place]). here with:  OSA on CPAP  -Noncompliant with CPAP -Encouraged patient to try to use machine consistently to help him get used to it -If he feels that he needs a different style of mask he can contact his DME company -Follow-up in 6 months or sooner if needed   Ward Givens, MSN, NP-C  11/11/2021, 11:45 AM [facility] [address], [place],  [address] [phone]

## 2021-11-13 ENCOUNTER — Other Ambulatory Visit: Payer: Self-pay | Admitting: Adult Health

## 2021-11-20 DIAGNOSIS — G4733 Obstructive sleep apnea (adult) (pediatric): Secondary | ICD-10-CM | POA: Diagnosis not present

## 2021-11-27 ENCOUNTER — Other Ambulatory Visit: Payer: Self-pay | Admitting: Internal Medicine

## 2021-11-27 ENCOUNTER — Other Ambulatory Visit: Payer: Self-pay | Admitting: Cardiology

## 2021-12-18 DIAGNOSIS — H401131 Primary open-angle glaucoma, bilateral, mild stage: Secondary | ICD-10-CM | POA: Diagnosis not present

## 2022-01-02 DIAGNOSIS — G4733 Obstructive sleep apnea (adult) (pediatric): Secondary | ICD-10-CM | POA: Diagnosis not present

## 2022-01-20 ENCOUNTER — Other Ambulatory Visit: Payer: Self-pay | Admitting: Internal Medicine

## 2022-01-20 MED ORDER — FUROSEMIDE 40 MG PO TABS: ORAL_TABLET | ORAL | 3 refills | Status: DC

## 2022-01-28 ENCOUNTER — Encounter: Payer: Self-pay | Admitting: Nurse Practitioner

## 2022-01-28 ENCOUNTER — Ambulatory Visit (INDEPENDENT_AMBULATORY_CARE_PROVIDER_SITE_OTHER): Payer: Medicare Other | Admitting: Nurse Practitioner

## 2022-01-28 VITALS — BP 131/83 | HR 81 | Temp 96.8°F | Wt 225.0 lb

## 2022-01-28 DIAGNOSIS — I700 Atherosclerosis of aorta: Secondary | ICD-10-CM | POA: Diagnosis not present

## 2022-01-28 DIAGNOSIS — M1610 Unilateral primary osteoarthritis, unspecified hip: Secondary | ICD-10-CM | POA: Diagnosis not present

## 2022-01-28 DIAGNOSIS — Z9861 Coronary angioplasty status: Secondary | ICD-10-CM

## 2022-01-28 DIAGNOSIS — K760 Fatty (change of) liver, not elsewhere classified: Secondary | ICD-10-CM

## 2022-01-28 DIAGNOSIS — I2510 Atherosclerotic heart disease of native coronary artery without angina pectoris: Secondary | ICD-10-CM

## 2022-01-28 DIAGNOSIS — E119 Type 2 diabetes mellitus without complications: Secondary | ICD-10-CM | POA: Diagnosis not present

## 2022-01-28 DIAGNOSIS — E785 Hyperlipidemia, unspecified: Secondary | ICD-10-CM

## 2022-01-28 DIAGNOSIS — E1159 Type 2 diabetes mellitus with other circulatory complications: Secondary | ICD-10-CM

## 2022-01-28 DIAGNOSIS — Z79899 Other long term (current) drug therapy: Secondary | ICD-10-CM

## 2022-01-28 DIAGNOSIS — E1142 Type 2 diabetes mellitus with diabetic polyneuropathy: Secondary | ICD-10-CM

## 2022-01-28 DIAGNOSIS — E1169 Type 2 diabetes mellitus with other specified complication: Secondary | ICD-10-CM | POA: Diagnosis not present

## 2022-01-28 DIAGNOSIS — E1121 Type 2 diabetes mellitus with diabetic nephropathy: Secondary | ICD-10-CM

## 2022-01-28 DIAGNOSIS — E66811 Obesity, class 1: Secondary | ICD-10-CM

## 2022-01-28 DIAGNOSIS — I10 Essential (primary) hypertension: Secondary | ICD-10-CM | POA: Diagnosis not present

## 2022-01-28 DIAGNOSIS — K219 Gastro-esophageal reflux disease without esophagitis: Secondary | ICD-10-CM

## 2022-01-28 DIAGNOSIS — E669 Obesity, unspecified: Secondary | ICD-10-CM

## 2022-01-28 DIAGNOSIS — F418 Other specified anxiety disorders: Secondary | ICD-10-CM

## 2022-01-28 DIAGNOSIS — E069 Thyroiditis, unspecified: Secondary | ICD-10-CM

## 2022-01-28 DIAGNOSIS — C61 Malignant neoplasm of prostate: Secondary | ICD-10-CM

## 2022-01-28 DIAGNOSIS — H409 Unspecified glaucoma: Secondary | ICD-10-CM

## 2022-01-28 DIAGNOSIS — G4733 Obstructive sleep apnea (adult) (pediatric): Secondary | ICD-10-CM

## 2022-01-28 NOTE — Progress Notes (Signed)
FOLLOW UP  Assessment and Plan:   1. Primary hypertension Continue medications as directed.  Discussed DASH diet, lifestyle modifications, including exercise. Monitor at home. Call if greater than 130/80.   - CBC with Differential/Platelet  2. ASCAD s/p PTCA (06/2012) Control blood pressure,  Control cholesterol,  Control glucose,  Remain active.   Continue to follow up with cardiology  - CBC with Differential/Platelet  3. Type 2 diabetes mellitus with vascular disease (HCC) Controlled Continue medications. Aim for A1C (<7.5), blood pressure (<130/80), and cholesterol (LDL <70) Eye Exam yearly and Dental Exam every 6 months. Discussed low carbohydrate, low sugar, heart healthy diet.   Remain active  - CBC with Differential/Platelet  4. Aortic atherosclerosis ([place]) by Abd CT scan 2015 Control blood pressure,  Control cholesterol,  Control glucose,  Remain active.   Continue to follow up with cardiology  - CBC with Differential/Platelet  5. Diabetes mellitus with coincident hypertension ([place]) Controlled Continue medications. Aim for A1C (<7.5), blood pressure (<130/80), and cholesterol (LDL <70) Eye Exam yearly and Dental Exam every 6 months. Discussed low carbohydrate, low sugar, heart healthy diet.   Remain active  - CBC with Differential/Platelet - Hemoglobin A1c  6. Gastroesophageal reflux disease without esophagitis Continue PPI/H2 blocker Avoid triggers, spicy foods, chocolate Avoid laying down after eating.  7. Fatty infiltration of liver Continue weight loss management Avoid alcohol/tylenol, hepatoxic agents discussed. Continue to monitor.  8. Hyperlipidemia associated with type 2 diabetes mellitus ([place]) Continue medications Discussed dietary and exercise modifications  - Lipid panel  9. Diabetic peripheral neuropathy (HCC) Check feet daily.  Suggested orthotic inserts. Continue medications Normal foot exam today  10.  Thyroiditis Controlled. Continue to monitor-normal scan 2017,  Continue to follow up with Dr. Cruzita Lederer  - TSH  11. Microalbuminuric diabetic nephropathy ([place]) Continue to monitor.  - COMPLETE METABOLIC PANEL WITH GFR  12. Primary osteoarthritis of hip, unspecified laterality Continue weight management Continue exercise. Good shoe support Continue to monitor  13. Malignant neoplasm of prostate ([place]) Treated by radiation, completed lupron Continue medications. Continue to follow up with Dr. Junious Silk  14. Obesity (BMI 30.0-34.9) Long discussion about weight loss, diet, and exercise Recommended diet heavy in fruits and veggies and low in animal meats, cheeses, and dairy products, appropriate calorie intake Patient will work on walking daily 10-15 min Discussed appropriate weight for height  Follow up at next visit  - COMPLETE METABOLIC PANEL WITH GFR  15. Medication management All medications reviewed and discussed. All questions and concerns addressed.  - CBC with Differential/Platelet - COMPLETE METABOLIC PANEL WITH GFR - Lipid panel - Hemoglobin A1c - TSH  18. OSA (obstructive sleep apnea) Continue CPAP Continue to monitor  19.  Depression with anxiety Controlled Continue Wellbutrin   Continue diet and meds as discussed. Further disposition pending results of labs. Discussed med's effects and SE's.   Over 30 minutes of exam, counseling, chart review, and critical decision making was performed.   Future Appointments  Date Time Provider [place]  04/16/2022 10:00 AM [name], MD [facility] None  05/13/2022 11:00 AM Ward Givens, NP [facility] None  07/20/2022 10:00 AM Magda Bernheim, NP [facility] None    ----------------------------------------------------------------------------------------------------------------------  HPI 76 y.o. male  presents for 3 month follow up on hypertension, cholesterol, diabetes, weight and vitamin D deficiency.    Recently had glaucoma follow up.  Had no vision changes since last eye exam. Mild STAGE.  Follows with Dr. Gershon [facility].  Dr. Brett Fairy is following for sleep  apnea. OSA on CPAP, follows with Neurology  He has hx of ASCAD s/p PTCA (06/2012), last stress test 10/2018 was normal obtained for surgical clearance, followed by Dr. Marlou Porch;    Former smoker, 37.5 pack year history, quit in 2012; last CXR 12/2020 was normal   Has hx of prostate cancer treated by radiation in 2016-2017; he is followed by Dr. [facility] q68m has not followed up since 2021 d/t being stable. Continues Flomax.   Some concern of ongoing depression since wife's passing. Currently treated by wellbutrin 300 mg daily. Patient states he is not depressed. He reports otherwise his mood is fine.   BMI is Body mass index is 32.28 kg/m., he has not been working on diet and exercise. Wt Readings from Last 3 Encounters:  01/28/22 225 lb (102.1 kg)  11/11/21 226 lb 12.8 oz (102.9 kg)  10/22/21 218 lb 12.8 oz (99.2 kg)    His blood pressure has been controlled at home, today their BP is BP: 131/83  He does not workout. He denies chest pain, shortness of breath, dizziness.   He is on cholesterol medication Atorvastatin and denies myalgias. His cholesterol is at goal. The cholesterol last visit was:   Lab Results  Component Value Date   CHOL 142 10/22/2021   HDL 45 10/22/2021   LDLCALC 65 10/22/2021   LDLDIRECT 45.7 10/30/2013   TRIG 276 (H) 10/22/2021   CHOLHDL 3.2 10/22/2021    He has not been working on diet and exercise for prediabetes, and denies increased appetite, nausea, polydipsia, and polyuria. Last A1C in the office was:  Lab Results  Component Value Date   HGBA1C 8.3 (H) 10/22/2021   Patient is on Vitamin D supplement.   Lab Results  Component Value Date   VD25OH 54 10/22/2021      Current Medications:  Current Outpatient Medications on File Prior to Visit  Medication Sig   Ascorbic Acid (VITAMIN  C) 100 MG tablet Take 100 mg by mouth daily.   aspirin EC 81 MG tablet Take 1 tablet (81 mg total) by mouth daily.   atorvastatin (LIPITOR) 10 MG tablet Take 1 tablet (10 mg total) by mouth daily. Take 1 tablet (10 mg total) by mouth daily.   Blood Glucose Monitoring Suppl (ONE TOUCH ULTRA SYSTEM KIT) w/Device KIT Check blood sugar 1 time daily-DX-E11.22   buPROPion (WELLBUTRIN XL) 300 MG 24 hr tablet Take  1 tablet  Daily  for Mood, Focus & Concentration / Patient knows to take by mouth   Cholecalciferol (VITAMIN D3) 125 MCG (5000 UT) CAPS Take 10,000 Units by mouth daily.    docusate sodium (COLACE) 100 MG capsule Take 1 capsule (100 mg total) by mouth 2 (two) times daily.   furosemide (LASIX) 40 MG tablet Take 1 tablet 2 x /day as needed  for Fluid Retention   gabapentin (NEURONTIN) 800 MG tablet TAKE 1 TABLET BY MOUTH 3 TO 4 TIMES DAILY FOR PAIN, HOT FLASHES AND SWEATS   glucose blood (ONETOUCH ULTRA) test strip CHECK BLOOD SUGAR 3 TIMES  DAILY   glycopyrrolate (ROBINUL) 2 MG tablet Take  1 tablet  3 x /day  for Excessive Sweating   insulin NPH-regular Human (HUMULIN 70/30) (70-30) 100 UNIT/ML injection INJECT SUBCUTANEOUSLY 50  UNITS TWICE DAILY   Insulin Syringes, Disposable, U-100 1 ML MISC 50 Units by Does not apply route 2 (two) times daily. inject 50 units into skin 2 x /day   Lancets (ONETOUCH DELICA PLUS [id number] MISC CHECK BLOOD SUGAR  3 TIMES  DAILY   latanoprost (XALATAN) 0.005 % ophthalmic solution Place 1 drop into both eyes at bedtime.   Magnesium 250 MG TABS Take 250 mg by mouth daily.    metFORMIN (GLUCOPHAGE-XR) 500 MG 24 hr tablet TAKE 2 TABLETS BY MOUTH  TWICE DAILY WITH MEALS FOR  DIABETES   metoprolol succinate (TOPROL-XL) 25 MG 24 hr tablet TAKE ONE-HALF TABLET BY  MOUTH DAILY   montelukast (SINGULAIR) 10 MG tablet TAKE 1 TABLET BY MOUTH  DAILY FOR ALLERGIES   Multiple Vitamin (MULTIVITAMIN WITH MINERALS) TABS tablet Take 1 tablet by mouth daily.   nitroGLYCERIN  (NITROSTAT) 0.4 MG SL tablet PLACE 1 TABLET UNDER THE TONGUE EVERY 5 MINUTES AS NEEDED FOR CHEST PAIN   pantoprazole (PROTONIX) 40 MG tablet TAKE 1 TABLET BY MOUTH  DAILY TO PREVENT HEARTBURN  AND INDIGESTION   Polyethyl Glycol-Propyl Glycol (SYSTANE) 0.4-0.3 % SOLN Place 2 drops into both eyes daily as needed (for dry eyes).   tamsulosin (FLOMAX) 0.4 MG CAPS capsule Take 0.4 mg by mouth at bedtime.   Vitamin E 400 units TABS Take by mouth. Taking 800 IU a day   Zinc 30 MG TABS Take 30 mg by mouth daily.   No current facility-administered medications on file prior to visit.   Allergies: No Active Allergies   Medical History:  Past Medical History:  Diagnosis Date   Arthritis    CAD (coronary artery disease)    a.s/p DES to mid LAD and OM2 06/2012.   Chronic back pain    Chronically dry eyes    Diabetes mellitus without complication (HCC)    TYPE 2    DKA (diabetic ketoacidoses) 01/30/2017   Elevated PSA    being monitored   Fatty liver    GERD (gastroesophageal reflux disease)    Hx of radiation therapy    prostate , [facility]    Hypertension    Hypertriglyceridemia    Intermediate coronary syndrome ([place]) 07/26/2012   Prostate cancer ([place])    RADTIATION    Family history- Reviewed and unchanged Social history- Reviewed and unchanged   Review of Systems:   Physical Exam: BP 131/83   Pulse 81   Temp (!) 96.8 F (36 C)   Wt 225 lb (102.1 kg)   SpO2 97%   BMI 32.28 kg/m  Wt Readings from Last 3 Encounters:  01/28/22 225 lb (102.1 kg)  11/11/21 226 lb 12.8 oz (102.9 kg)  10/22/21 218 lb 12.8 oz (99.2 kg)   General Appearance: Well nourished, in no apparent distress. Eyes: PERRLA, EOMs, conjunctiva no swelling or erythema Sinuses: No Frontal/maxillary tenderness ENT/Mouth: Ext aud canals clear, TMs without erythema, bulging. No erythema, swelling, or exudate on post pharynx.  Tonsils not swollen or erythematous. Hearing normal.  Neck: Supple, thyroid  normal.  Respiratory: Respiratory effort normal, BS equal bilaterally without rales, rhonchi, wheezing or stridor.  Cardio: RRR with no MRGs. Brisk peripheral pulses without edema.  Abdomen: Soft, + BS.  Non tender, no guarding, rebound, hernias, masses. Lymphatics: Non tender without lymphadenopathy.  Musculoskeletal: Full ROM, 5/5 strength, Normal gait Skin: Warm, dry without rashes, lesions, ecchymosis.  Neuro: Cranial nerves intact. No cerebellar symptoms.  Psych: Awake and oriented X 3, normal affect, Insight and Judgment appropriate.    Darrol Jump, NP 4:08 PM [facility]

## 2022-01-29 LAB — CBC WITH DIFFERENTIAL/PLATELET
Absolute Monocytes: 597 cells/uL (ref 200–950)
Basophils Absolute: 41 cells/uL (ref 0–200)
Basophils Relative: 0.8 %
Eosinophils Absolute: 153 cells/uL (ref 15–500)
Eosinophils Relative: 3 %
HCT: 39 % (ref 38.5–50.0)
Hemoglobin: 13 g/dL — ABNORMAL LOW (ref 13.2–17.1)
Lymphs Abs: 1566 cells/uL (ref 850–3900)
MCH: 29.7 pg (ref 27.0–33.0)
MCHC: 33.3 g/dL (ref 32.0–36.0)
MCV: 89.2 fL (ref 80.0–100.0)
MPV: 11.7 fL (ref 7.5–12.5)
Monocytes Relative: 11.7 %
Neutro Abs: 2744 cells/uL (ref 1500–7800)
Neutrophils Relative %: 53.8 %
Platelets: 182 10*3/uL (ref 140–400)
RBC: 4.37 10*6/uL (ref 4.20–5.80)
RDW: 12.4 % (ref 11.0–15.0)
Total Lymphocyte: 30.7 %
WBC: 5.1 10*3/uL (ref 3.8–10.8)

## 2022-01-29 LAB — COMPLETE METABOLIC PANEL WITH GFR
AG Ratio: 1.7 (calc) (ref 1.0–2.5)
ALT: 40 U/L (ref 9–46)
AST: 29 U/L (ref 10–35)
Albumin: 4.4 g/dL (ref 3.6–5.1)
Alkaline phosphatase (APISO): 83 U/L (ref 35–144)
BUN: 16 mg/dL (ref 7–25)
CO2: 26 mmol/L (ref 20–32)
Calcium: 9.9 mg/dL (ref 8.6–10.3)
Chloride: 104 mmol/L (ref 98–110)
Creat: 1.21 mg/dL (ref 0.70–1.28)
Globulin: 2.6 g/dL (calc) (ref 1.9–3.7)
Glucose, Bld: 227 mg/dL — ABNORMAL HIGH (ref 65–99)
Potassium: 4.4 mmol/L (ref 3.5–5.3)
Sodium: 140 mmol/L (ref 135–146)
Total Bilirubin: 0.3 mg/dL (ref 0.2–1.2)
Total Protein: 7 g/dL (ref 6.1–8.1)
eGFR: 63 mL/min/{1.73_m2} (ref >=60)

## 2022-01-29 LAB — LIPID PANEL
Cholesterol: 153 mg/dL (ref <200)
HDL: 39 mg/dL — ABNORMAL LOW (ref >=40)
Non-HDL Cholesterol (Calc): 114 mg/dL (calc) (ref <130)
Total CHOL/HDL Ratio: 3.9 (calc) (ref <5.0)
Triglycerides: 521 mg/dL — ABNORMAL HIGH (ref <150)

## 2022-01-29 LAB — HEMOGLOBIN A1C
Hgb A1c MFr Bld: 8.1 % of total Hgb — ABNORMAL HIGH (ref <5.7)
Mean Plasma Glucose: 186 mg/dL
eAG (mmol/L): 10.3 mmol/L

## 2022-01-29 LAB — TSH: TSH: 0.75 mIU/L (ref 0.40–4.50)

## 2022-02-10 DIAGNOSIS — M5451 Vertebrogenic low back pain: Secondary | ICD-10-CM | POA: Diagnosis not present

## 2022-02-10 DIAGNOSIS — M5441 Lumbago with sciatica, right side: Secondary | ICD-10-CM | POA: Diagnosis not present

## 2022-02-10 DIAGNOSIS — M25551 Pain in right hip: Secondary | ICD-10-CM | POA: Diagnosis not present

## 2022-02-10 DIAGNOSIS — Z96641 Presence of right artificial hip joint: Secondary | ICD-10-CM | POA: Diagnosis not present

## 2022-02-22 ENCOUNTER — Other Ambulatory Visit: Payer: Self-pay | Admitting: Adult Health

## 2022-02-24 ENCOUNTER — Other Ambulatory Visit: Payer: Self-pay | Admitting: Adult Health

## 2022-03-04 DIAGNOSIS — M5451 Vertebrogenic low back pain: Secondary | ICD-10-CM | POA: Diagnosis not present

## 2022-03-04 DIAGNOSIS — M25551 Pain in right hip: Secondary | ICD-10-CM | POA: Diagnosis not present

## 2022-03-12 ENCOUNTER — Other Ambulatory Visit: Payer: Self-pay | Admitting: Nurse Practitioner

## 2022-03-12 DIAGNOSIS — E1159 Type 2 diabetes mellitus with other circulatory complications: Secondary | ICD-10-CM

## 2022-03-12 MED ORDER — ONETOUCH ULTRA 2 W/DEVICE KIT: PACK | 0 refills | Status: DC

## 2022-03-19 DIAGNOSIS — H401131 Primary open-angle glaucoma, bilateral, mild stage: Secondary | ICD-10-CM | POA: Diagnosis not present

## 2022-03-24 ENCOUNTER — Telehealth: Payer: Self-pay | Admitting: Internal Medicine

## 2022-03-24 NOTE — Telephone Encounter (Signed)
patient called to discuss getting a CPAP machine reissued. According to patient insurance required patient to send CPAP back. He states he would like to try this treatment again. I advised patient to contact [place], his sleep specialist. Patient agreeable to call; I confirmed his contact number for [facility].Patient acknowledged understanding and appreciated the assistance. Advised patient to call PCP back if anything else was needed.

## 2022-03-25 ENCOUNTER — Telehealth: Payer: Self-pay | Admitting: Adult Health

## 2022-03-25 NOTE — Telephone Encounter (Signed)
Pt states his insurance company picked up his CPAP, pt would like to start over.  Pt asking for a call to discuss what is the 1st thing he needs to do to start back CPAP therapy.

## 2022-03-25 NOTE — Telephone Encounter (Signed)
I called pt back. He sts he had to return his CPAP about a month ago and would like to try and restart the process.  I advised first step would be to complete f/u with clinic, he is already scheduled for 05/13/2022.  Pt's last sleep study was in 2022.

## 2022-03-26 DIAGNOSIS — M19071 Primary osteoarthritis, right ankle and foot: Secondary | ICD-10-CM | POA: Diagnosis not present

## 2022-03-26 DIAGNOSIS — E1142 Type 2 diabetes mellitus with diabetic polyneuropathy: Secondary | ICD-10-CM | POA: Diagnosis not present

## 2022-03-26 DIAGNOSIS — M19072 Primary osteoarthritis, left ankle and foot: Secondary | ICD-10-CM | POA: Diagnosis not present

## 2022-03-26 DIAGNOSIS — G629 Polyneuropathy, unspecified: Secondary | ICD-10-CM | POA: Diagnosis not present

## 2022-03-30 DIAGNOSIS — M47816 Spondylosis without myelopathy or radiculopathy, lumbar region: Secondary | ICD-10-CM | POA: Diagnosis not present

## 2022-04-03 DIAGNOSIS — G629 Polyneuropathy, unspecified: Secondary | ICD-10-CM | POA: Diagnosis not present

## 2022-04-08 DIAGNOSIS — D123 Benign neoplasm of transverse colon: Secondary | ICD-10-CM | POA: Diagnosis not present

## 2022-04-08 DIAGNOSIS — Z09 Encounter for follow-up examination after completed treatment for conditions other than malignant neoplasm: Secondary | ICD-10-CM | POA: Diagnosis not present

## 2022-04-08 DIAGNOSIS — D128 Benign neoplasm of rectum: Secondary | ICD-10-CM | POA: Diagnosis not present

## 2022-04-08 DIAGNOSIS — Z86010 Personal history of colonic polyps: Secondary | ICD-10-CM | POA: Diagnosis not present

## 2022-04-08 LAB — HM COLONOSCOPY

## 2022-04-13 DIAGNOSIS — D128 Benign neoplasm of rectum: Secondary | ICD-10-CM | POA: Diagnosis not present

## 2022-04-13 DIAGNOSIS — D123 Benign neoplasm of transverse colon: Secondary | ICD-10-CM | POA: Diagnosis not present

## 2022-04-15 DIAGNOSIS — E1142 Type 2 diabetes mellitus with diabetic polyneuropathy: Secondary | ICD-10-CM | POA: Diagnosis not present

## 2022-04-15 DIAGNOSIS — M19071 Primary osteoarthritis, right ankle and foot: Secondary | ICD-10-CM | POA: Diagnosis not present

## 2022-04-15 DIAGNOSIS — M19072 Primary osteoarthritis, left ankle and foot: Secondary | ICD-10-CM | POA: Diagnosis not present

## 2022-04-16 ENCOUNTER — Encounter: Payer: Medicare Other | Admitting: Internal Medicine

## 2022-04-16 DIAGNOSIS — M47816 Spondylosis without myelopathy or radiculopathy, lumbar region: Secondary | ICD-10-CM | POA: Diagnosis not present

## 2022-04-19 ENCOUNTER — Other Ambulatory Visit: Payer: Self-pay | Admitting: Internal Medicine

## 2022-04-19 DIAGNOSIS — F339 Major depressive disorder, recurrent, unspecified: Secondary | ICD-10-CM

## 2022-04-20 ENCOUNTER — Telehealth: Payer: Self-pay | Admitting: Gastroenterology

## 2022-04-20 NOTE — Telephone Encounter (Signed)
Due to increased advanced endoscopy demands in the coming months, I am not sure when I will have availability. FYI PB I will review chart and place a notation in the next couple of days. GM

## 2022-04-20 NOTE — Telephone Encounter (Signed)
Dr. Rush Landmark,  Please see referral from Dr. Alessandra Bevels. Patient is needing EMR.  Thank you, Colletta [place]

## 2022-04-21 NOTE — Telephone Encounter (Signed)
Left message on machine to call back  

## 2022-04-21 NOTE — Telephone Encounter (Signed)
I have not seen this patient's chart past by my desk as of yet. As I am in clinic today and then procedures today hopefully I can review things. Thanks. GM

## 2022-04-21 NOTE — Telephone Encounter (Signed)
I have reviewed packet of referral that was sent. The reason for referral is noted that there is a large 3 cm tubular adenoma in the transverse colon. I do not have the colonoscopy report or images or pathology. I am willing to meet this patient in clinic but will need to have color images and pathology for review.  He can be scheduled for a colonoscopy with EMR 90-minute slot for late September or October based on availability but I will need to talk with him in clinic before.  If the patient wants to wait on scheduling until they see me in clinic, that is okay as well. Please have the colonoscopy color images and report and pathology available. Thanks. GM

## 2022-04-21 NOTE — Telephone Encounter (Signed)
The pt returned call- he does not want to have any more procedures but does agree to come in and speak with Dr Rush Landmark.  I offered to set up app for colon EMR so that he will have a date on the books but he tells me that he is not sure he is going to proceed.  He will call if he decides to cancel and not proceed.

## 2022-04-23 ENCOUNTER — Other Ambulatory Visit: Payer: Self-pay

## 2022-04-23 MED ORDER — NITROGLYCERIN 0.4 MG SL SUBL: SUBLINGUAL_TABLET | SUBLINGUAL | 6 refills | Status: DC

## 2022-04-30 ENCOUNTER — Encounter: Payer: Self-pay | Admitting: Internal Medicine

## 2022-04-30 ENCOUNTER — Ambulatory Visit (INDEPENDENT_AMBULATORY_CARE_PROVIDER_SITE_OTHER): Payer: Medicare Other | Admitting: Internal Medicine

## 2022-04-30 VITALS — BP 136/80 | HR 76 | Temp 97.6°F | Resp 18 | Ht 69.0 in | Wt 221.0 lb

## 2022-04-30 DIAGNOSIS — E1169 Type 2 diabetes mellitus with other specified complication: Secondary | ICD-10-CM

## 2022-04-30 DIAGNOSIS — I2510 Atherosclerotic heart disease of native coronary artery without angina pectoris: Secondary | ICD-10-CM

## 2022-04-30 DIAGNOSIS — Z0001 Encounter for general adult medical examination with abnormal findings: Secondary | ICD-10-CM

## 2022-04-30 DIAGNOSIS — I700 Atherosclerosis of aorta: Secondary | ICD-10-CM

## 2022-04-30 DIAGNOSIS — E1122 Type 2 diabetes mellitus with diabetic chronic kidney disease: Secondary | ICD-10-CM

## 2022-04-30 DIAGNOSIS — C61 Malignant neoplasm of prostate: Secondary | ICD-10-CM

## 2022-04-30 DIAGNOSIS — I10 Essential (primary) hypertension: Secondary | ICD-10-CM

## 2022-04-30 DIAGNOSIS — Z79899 Other long term (current) drug therapy: Secondary | ICD-10-CM

## 2022-04-30 DIAGNOSIS — Z87891 Personal history of nicotine dependence: Secondary | ICD-10-CM

## 2022-04-30 DIAGNOSIS — Z136 Encounter for screening for cardiovascular disorders: Secondary | ICD-10-CM

## 2022-04-30 DIAGNOSIS — E559 Vitamin D deficiency, unspecified: Secondary | ICD-10-CM | POA: Diagnosis not present

## 2022-04-30 DIAGNOSIS — Z0000 Encounter for general adult medical examination without abnormal findings: Secondary | ICD-10-CM

## 2022-04-30 DIAGNOSIS — Z1211 Encounter for screening for malignant neoplasm of colon: Secondary | ICD-10-CM

## 2022-04-30 DIAGNOSIS — N182 Chronic kidney disease, stage 2 (mild): Secondary | ICD-10-CM | POA: Diagnosis not present

## 2022-04-30 NOTE — Progress Notes (Signed)
Annual  Screening/Preventative Visit  & Comprehensive Evaluation & Examination  Future Appointments  Date Time Provider Department  04/30/2022  3:00 PM [name], MD [facility]  05/13/2022 11:00 AM Ward Givens, NP [facility]  06/16/2022  2:10 PM Mansouraty, Telford Nab., MD [facility]  08/03/2022  9:30 AM Alycia Rossetti, NP [facility]  05/06/2023  3:00 PM [name], MD [facility]              This very nice 76 y.o. recently Widowed BM presents for a Screening /Preventative Visit & comprehensive evaluation and management of multiple medical co-morbidities.  Patient has been followed for HTN, HLD, T2_IDDM  and Vitamin D Deficiency.  Abd CT scan in 2015 showed Aortic Atherosclerosis. Patient has GERD controlled on his meds.  Patient has hx/o Prostate Ca treated by XRT in 2018 & followed by active surveillance.  In Nov 2022, Dr Dohmieir Dx'd OSA & recc auto CPAP.         HTN predates since 64. Patient's BP has been controlled at home.  Today's BP is at goal - 126/78.   Patient has ASCAD with PCA & Stent placed in 2013 and is followed by Dr Marlou Porch.  Stress Lexiscan in Feb 2020 was Normal.  Patient denies any cardiac symptoms as chest pain, palpitations, shortness of breath, dizziness or ankle swelling.       Patient's hyperlipidemia is controlled with diet and medications. Patient denies myalgias or other medication SE's. Last lipids were at goal except elevated Trig's :  Lab Results  Component Value Date   CHOL 153 01/28/2022   HDL 39 (L) 01/28/2022   LDLCALC  not calculated 01/28/2022   TRIG 521 (H) 01/28/2022   CHOLHDL 3.9 01/28/2022         Patient has hx/o T2_IDDM (1998) w/CKD2 (GFR 6) and  was treated with Metformin til transitioned to Insulin (2016). patient denies reactive hypoglycemic symptoms, visual blurring, diabetic polys or paresthesias. Last A1c was not at goal :   Lab Results  Component Value Date   HGBA1C 8.1 (H) 01/28/2022         Finally,  patient has history of Vitamin D Deficiency ("15" /2008)  and last vitamin D was normal :   Lab Results  Component Value Date   VD25OH 54 10/22/2021       Current Outpatient Medications:    Ascorbic Acid (VITAMIN C) 100 MG tablet, Take 100 mg by mouth daily., Disp: , Rfl:    aspirin EC 81 MG tablet, Take 1 tablet (81 mg total) by mouth daily., Disp: 90 tablet, Rfl: 3   atorvastatin (LIPITOR) 10 MG tablet, TAKE 1 TABLET BY MOUTH  DAILY, Disp: 90 tablet, Rfl: 3   Blood Glucose Monitoring Suppl (ONE TOUCH ULTRA 2) w/Device KIT, Use as directed, Disp: 1 kit, Rfl: 0   buPROPion (WELLBUTRIN XL) 300 MG 24 hr tablet, Take  1 tablet  Daily for Mood , Focus & Concentration                              /                     TAKE                        BY  MOUTH, Disp: 90 tablet, Rfl: 3   Cholecalciferol (VITAMIN D3) 125 MCG (5000 UT) CAPS, Take 10,000 Units by mouth daily. , Disp: , Rfl:    docusate sodium (COLACE) 100 MG capsule, Take 1 capsule (100 mg total) by mouth 2 (two) times daily., Disp: 30 capsule, Rfl: 0   furosemide (LASIX) 40 MG tablet, Take 1 tablet 2 x /day as needed  for Fluid Retention, Disp: 180 tablet, Rfl: 3   gabapentin (NEURONTIN) 800 MG tablet, TAKE 1 TABLET BY MOUTH 3 TO 4 TIMES DAILY FOR PAIN, HOT FLASHES AND SWEATS, Disp: 360 tablet, Rfl: 3   glucose blood (ONETOUCH ULTRA) test strip, CHECK BLOOD SUGAR 3 TIMES  DAILY, Disp: 300 strip, Rfl: 3   glycopyrrolate (ROBINUL) 2 MG tablet, Take  1 tablet  3 x /day  for Excessive Sweating, Disp: 90 tablet, Rfl: 11   insulin NPH-regular Human (HUMULIN 70/30) (70-30) 100 UNIT/ML injection, INJECT SUBCUTANEOUSLY 50  UNITS TWICE DAILY, Disp: 90 mL, Rfl: 3   Insulin Syringes, Disposable, U-100 1 ML MISC, 50 Units by Does not apply route 2 (two) times daily. inject 50 units into skin 2 x /day, Disp: 200 each, Rfl: 3   Lancets (ONETOUCH DELICA PLUS [id number]) MISC, CHECK BLOOD SUGAR 3 TIMES  DAILY, Disp: 300 each, Rfl:  3   latanoprost (XALATAN) 0.005 % ophthalmic solution, Place 1 drop into both eyes at bedtime., Disp: , Rfl:    Magnesium 250 MG TABS, Take 250 mg by mouth daily. , Disp: , Rfl:    metFORMIN (GLUCOPHAGE-XR) 500 MG 24 hr tablet, TAKE 2 TABLETS BY MOUTH  TWICE DAILY WITH MEALS FOR  DIABETES, Disp: 360 tablet, Rfl: 3   metoprolol succinate (TOPROL-XL) 25 MG 24 hr tablet, TAKE ONE-HALF TABLET BY  MOUTH DAILY, Disp: 45 tablet, Rfl: 3   montelukast (SINGULAIR) 10 MG tablet, TAKE 1 TABLET BY MOUTH  DAILY FOR ALLERGIES, Disp: 90 tablet, Rfl: 3   Multiple Vitamin (MULTIVITAMIN WITH MINERALS) TABS tablet, Take 1 tablet by mouth daily., Disp: , Rfl:    nitroGLYCERIN (NITROSTAT) 0.4 MG SL tablet, PLACE 1 TABLET UNDER THE TONGUE EVERY 5 MINUTES AS NEEDED FOR CHEST PAIN, Disp: 25 tablet, Rfl: 6   pantoprazole (PROTONIX) 40 MG tablet, TAKE 1 TABLET BY MOUTH  DAILY TO PREVENT HEARTBURN  AND INDIGESTION, Disp: 90 tablet, Rfl: 3   Polyethyl Glycol-Propyl Glycol (SYSTANE) 0.4-0.3 % SOLN, Place 2 drops into both eyes daily as needed (for dry eyes)., Disp: , Rfl:    tamsulosin (FLOMAX) 0.4 MG CAPS capsule, Take 0.4 mg by mouth at bedtime., Disp: , Rfl:    Vitamin E 400 units TABS, Take by mouth. Taking 800 IU a day, Disp: , Rfl:    Zinc 30 MG TABS, Take 30 mg by mouth daily., Disp: , Rfl:    No Known Allergies   Past Medical History:  Diagnosis Date   Arthritis    CAD (coronary artery disease)    a.s/p DES to mid LAD and OM2 06/2012.   Chronic back pain    Chronically dry eyes    Diabetes mellitus without complication (HCC)    TYPE 2    DKA (diabetic ketoacidoses) 01/30/2017   Elevated PSA    being monitored   Fatty liver    GERD (gastroesophageal reflux disease)    Hx of radiation therapy    prostate , [facility]    Hypertension    Hypertriglyceridemia    Intermediate coronary syndrome ([place]) 07/26/2012  Prostate cancer [facility])    RADTIATION      Health Maintenance  Topic Date Due    COVID-19 Vaccine (1) Never done   Zoster Vaccines- Shingrix (1 of 2) Never done   COLONOSCOPY  02/13/2020   FOOT EXAM  04/10/2021   URINE MICROALBUMIN  04/11/2021   INFLUENZA VACCINE  04/14/2021   HEMOGLOBIN A1C  04/29/2021   OPHTHALMOLOGY EXAM  06/03/2021   TETANUS/TDAP  11/27/2023   Hepatitis C Screening  Completed   PNA vac Low Risk Adult  Completed   HPV VACCINES  Aged Out     Immunization History  Administered Date(s) Administered   Influenza Split 06/26/2012   Influenza, High Dose  06/14/2017, 06/14/2019, 07/01/2020   Influenza 06/14/2013, 06/25/2014, 06/03/2018   Pneumococcal -13 04/23/2016   Pneumococcal -23 06/14/2017   Pneumococcal -23 06/16/2010   Td 09/17/2003   Tdap 11/26/2013   Zoster, Live 07/12/2012    Last Colon -  02/12/2010-  Dr Katheran Awe recc 10 yr f/u due June 2021 (patient aware overdue).   Past Surgical History:  Procedure Laterality Date   APPENDECTOMY     CARDIAC SURGERY  ~2017   3 stent placed.   COLONOSCOPY  02/12/2010   Buccini   LEFT HEART CATHETERIZATION WITH CORONARY ANGIOGRAM N/A 06/27/2012   Procedure: LEFT HEART CATHETERIZATION WITH CORONARY ANGIOGRAM;  Surgeon: Candee Furbish, MD;  Location: [facility] CATH LAB;  Service: Cardiovascular;  Laterality: N/A;   LEFT HEART CATHETERIZATION WITH CORONARY ANGIOGRAM N/A 07/25/2012   Procedure: LEFT HEART CATHETERIZATION WITH CORONARY ANGIOGRAM;  Surgeon: Sueanne Margarita, MD;  Location: [facility] CATH LAB;  Service: Cardiovascular;  Laterality: N/A;   PROSTATE BIOPSY     RADIOLOGY WITH ANESTHESIA N/A 12/03/2015   Procedure: MRI LUMBAR SPINE;  Surgeon: Medication Radiologist, MD;  Location: [place];  Service: Radiology;  Laterality: N/A;   TOTAL HIP ARTHROPLASTY Right 04/28/2019   Procedure: TOTAL HIP ARTHROPLASTY ANTERIOR APPROACH;  Surgeon: Dorna Leitz, MD;  Location: [facility];  Service: Orthopedics;  Laterality: Right;     Family History  Problem Relation Age of Onset   Diabetes Sister     Social History    Socioeconomic History   Marital status: Married    Spouse name: Not on file   Number of children: 4   Years of education: 2 y colleg   Highest education level: Not on file  Occupational History   Occupation: SUPERVISOR    Employer: [employer]  Tobacco Use   Smoking status: Former    Packs/day: 0.75    Years: 50.00    Pack years: 37.50    Types: Cigarettes    Start date: 27    Quit date: 07/27/2011    Years since quitting: 9.7   Smokeless tobacco: Never  Substance and Sexual Activity   Alcohol use: Not Currently    Comment: qut 20 years ago per patient    Drug use: No   Sexual activity: Not on file  Other Topics Concern   Not on file    ROS Constitutional: Denies fever, chills, weight loss/gain, headaches, insomnia,  night sweats or change in appetite. Does c/o fatigue. Eyes: Denies redness, blurred vision, diplopia, discharge, itchy or watery eyes.  ENT: Denies discharge, congestion, post nasal drip, epistaxis, sore throat, earache, hearing loss, dental pain, Tinnitus, Vertigo, Sinus pain or snoring.  Cardio: Denies chest pain, palpitations, irregular heartbeat, syncope, dyspnea, diaphoresis, orthopnea, PND, claudication or edema Respiratory: denies cough, dyspnea, DOE, pleurisy, hoarseness, laryngitis or wheezing.  Gastrointestinal: Denies dysphagia, heartburn, reflux, water brash, pain, cramps, nausea, vomiting, bloating, diarrhea, constipation, hematemesis, melena, hematochezia, jaundice or hemorrhoids Genitourinary: Denies dysuria, frequency,discharge, hematuria or flank pain. Has urgency, nocturia x 2-3 & occasional hesitancy. Musculoskeletal: Denies arthralgia, myalgia, stiffness, Jt. Swelling, pain, limp or strain/sprain. Denies Falls. Skin: Denies puritis, rash, hives, warts, acne, eczema or change in skin lesion Neuro: No weakness, tremor, incoordination, spasms, paresthesia or pain Psychiatric: Denies confusion, memory loss or sensory loss. Denies  Depression. Endocrine: Denies change in weight, skin, hair change, nocturia, and paresthesia, diabetic polys, visual blurring or hyper / hypo glycemic episodes.  Heme/Lymph: No excessive bleeding, bruising or enlarged lymph nodes.   Physical Exam  There were no vitals taken for this visit.  General Appearance:  Over nourished and well groomed and in no apparent distress.  Eyes: PERRLA, EOMs, conjunctiva no swelling or erythema, normal fundi and vessels. Sinuses: No frontal/maxillary tenderness ENT/Mouth: EACs patent / TMs  nl. Nares clear without erythema, swelling, mucoid exudates. Oral hygiene is good. No erythema, swelling, or exudate.  Mallampati II. Tonsils not swollen or erythematous. Hearing normal.  Neck: Supple, thyroid not palpable. No bruits, nodes or JVD. Respiratory: Respiratory effort normal.  BS equal and clear bilateral without rales, rhonci, wheezing or stridor. Cardio: Heart sounds are normal with regular rate and rhythm and no murmurs, rubs or gallops. Peripheral pulses are normal and equal bilaterally without edema. No aortic or femoral bruits. Chest: symmetric with normal excursions and percussion.  Abdomen: Soft, with Nl bowel sounds. Nontender, no guarding, rebound, hernias, masses, or organomegaly.  Lymphatics: Non tender without lymphadenopathy.  Musculoskeletal: Full ROM all peripheral extremities, joint stability, 5/5 strength, and normal gait. Skin: Warm and dry without rashes, lesions, cyanosis, clubbing or  ecchymosis.  Neuro: Cranial nerves intact, reflexes equal bilaterally. Normal muscle tone, no cerebellar symptoms. Sensation intact.  Pysch: Alert and oriented X 3 with normal affect, insight and judgment appropriate.   Assessment and Plan  1. Annual Preventative/Screening Exam    2. Essential hypertension  - EKG 12-Lead - [place], RETROPERITNL ABD,  LTD - Urinalysis, Routine w reflex microscopic - Microalbumin / creatinine urine ratio - CBC with  Differential/Platelet - COMPLETE METABOLIC PANEL WITH GFR - Magnesium - TSH  3. Hyperlipidemia associated with type 2 diabetes mellitus ([place])  - EKG 12-Lead - [place], RETROPERITNL ABD,  LTD - Lipid panel - TSH  4. Type 2 diabetes mellitus with stage 2 chronic kidney  disease, with long-term current use of insulin (HCC)  - EKG 12-Lead - [place], RETROPERITNL ABD,  LTD - Urinalysis, Routine w reflex microscopic - Microalbumin / creatinine urine ratio - Hemoglobin A1c - Insulin, random  5. Vitamin D deficiency  - VITAMIN D 25 Hydroxy   6. OSA (obstructive sleep apnea)  - Ambulatory referral to Sleep Studies  7. Insulin-requiring or dependent type II diabetes mellitus ([place])  - HM DIABETES FOOT EXAM - LOW EXTREMITY NEUR EXAM DOCUM  8. Aortic atherosclerosis ([place]) by Abd CT scan 2015  - EKG 12-Lead - [place], RETROPERITNL ABD,  LTD - Insulin, random  9. ASCAD s/p PTCA (06/2012)  - EKG 12-Lead  10. BPH with obstruction/lower urinary tract symptoms  - PSA  11. Gastroesophageal reflux disease  - CBC with Differential/Platelet  12. History of prostate cancer  - PSA  13. Screening for ischemic heart disease  - EKG 12-Lead  14. FHx: heart disease  - EKG 12-Lead - [place], RETROPERITNL ABD,  LTD  15. Screening for colorectal cancer  -  EKG 12-Lead - [place], RETROPERITNL ABD,  LTD - POC Hemoccult Bld/Stl   16. Former smoker  - EKG 12-Lead - [place], RETROPERITNL ABD,  LTD  17. Screening for AAA (aortic abdominal aneurysm)  - [place], RETROPERITNL ABD,  LTD  18. Medication management  - Urinalysis, Routine w reflex microscopic - Microalbumin / creatinine urine ratio - CBC with Differential/Platelet - COMPLETE METABOLIC PANEL WITH GFR - Magnesium - Lipid panel - TSH - Hemoglobin A1c - Insulin, random - VITAMIN D 25 Hydroxy          Patient was counseled in prudent diet, weight control to achieve/maintain BMI less than 25, BP monitoring, regular exercise and medications as  discussed.  Discussed med effects and SE's. Routine screening labs and tests as requested with regular follow-up as recommended. Over 40 minutes of exam, counseling, chart review and high complex critical decision making was performed   Kirtland Bouchard, MD

## 2022-04-30 NOTE — Patient Instructions (Signed)

## 2022-05-01 LAB — COMPLETE METABOLIC PANEL WITH GFR
AG Ratio: 1.9 (calc) (ref 1.0–2.5)
ALT: 31 U/L (ref 9–46)
AST: 24 U/L (ref 10–35)
Albumin: 4.5 g/dL (ref 3.6–5.1)
Alkaline phosphatase (APISO): 59 U/L (ref 35–144)
BUN: 11 mg/dL (ref 7–25)
CO2: 26 mmol/L (ref 20–32)
Calcium: 9.8 mg/dL (ref 8.6–10.3)
Chloride: 100 mmol/L (ref 98–110)
Creat: 1.19 mg/dL (ref 0.70–1.28)
Globulin: 2.4 g/dL (calc) (ref 1.9–3.7)
Glucose, Bld: 159 mg/dL — ABNORMAL HIGH (ref 65–99)
Potassium: 4.6 mmol/L (ref 3.5–5.3)
Sodium: 135 mmol/L (ref 135–146)
Total Bilirubin: 0.4 mg/dL (ref 0.2–1.2)
Total Protein: 6.9 g/dL (ref 6.1–8.1)
eGFR: 64 mL/min/{1.73_m2} (ref >=60)

## 2022-05-01 LAB — CBC WITH DIFFERENTIAL/PLATELET
Absolute Monocytes: 762 cells/uL (ref 200–950)
Basophils Absolute: 38 cells/uL (ref 0–200)
Basophils Relative: 0.6 %
Eosinophils Absolute: 154 cells/uL (ref 15–500)
Eosinophils Relative: 2.4 %
HCT: 38.4 % — ABNORMAL LOW (ref 38.5–50.0)
Hemoglobin: 12.8 g/dL — ABNORMAL LOW (ref 13.2–17.1)
Lymphs Abs: 1370 cells/uL (ref 850–3900)
MCH: 29.8 pg (ref 27.0–33.0)
MCHC: 33.3 g/dL (ref 32.0–36.0)
MCV: 89.5 fL (ref 80.0–100.0)
MPV: 12.3 fL (ref 7.5–12.5)
Monocytes Relative: 11.9 %
Neutro Abs: 4077 cells/uL (ref 1500–7800)
Neutrophils Relative %: 63.7 %
Platelets: 168 10*3/uL (ref 140–400)
RBC: 4.29 10*6/uL (ref 4.20–5.80)
RDW: 13.5 % (ref 11.0–15.0)
Total Lymphocyte: 21.4 %
WBC: 6.4 10*3/uL (ref 3.8–10.8)

## 2022-05-01 LAB — HEMOGLOBIN A1C
Hgb A1c MFr Bld: 7.5 % of total Hgb — ABNORMAL HIGH (ref <5.7)
Mean Plasma Glucose: 169 mg/dL
eAG (mmol/L): 9.3 mmol/L

## 2022-05-01 LAB — LIPID PANEL
Cholesterol: 138 mg/dL (ref <200)
HDL: 47 mg/dL (ref >=40)
LDL Cholesterol (Calc): 58 mg/dL (calc)
Non-HDL Cholesterol (Calc): 91 mg/dL (calc) (ref <130)
Total CHOL/HDL Ratio: 2.9 (calc) (ref <5.0)
Triglycerides: 280 mg/dL — ABNORMAL HIGH (ref <150)

## 2022-05-01 LAB — VITAMIN D 25 HYDROXY (VIT D DEFICIENCY, FRACTURES): Vit D, 25-Hydroxy: 53 ng/mL (ref 30–100)

## 2022-05-01 LAB — TSH: TSH: 0.93 mIU/L (ref 0.40–4.50)

## 2022-05-01 LAB — MAGNESIUM: Magnesium: 1.9 mg/dL (ref 1.5–2.5)

## 2022-05-01 LAB — INSULIN, RANDOM: Insulin: 55 u[IU]/mL — ABNORMAL HIGH

## 2022-05-01 LAB — PSA: PSA: <0.04 ng/mL (ref <=4.00)

## 2022-05-03 ENCOUNTER — Other Ambulatory Visit: Payer: Self-pay | Admitting: Internal Medicine

## 2022-05-03 DIAGNOSIS — E1122 Type 2 diabetes mellitus with diabetic chronic kidney disease: Secondary | ICD-10-CM

## 2022-05-03 MED ORDER — SEMAGLUTIDE(0.25 OR 0.5MG/DOS) 2 MG/3ML ~~LOC~~ SOPN: PEN_INJECTOR | SUBCUTANEOUS | 0 refills | Status: DC

## 2022-05-03 NOTE — Progress Notes (Signed)
<><><><><><><><><><><><><><><><><><><><><><><><><><><><><><><><><> <><><><><><><><><><><><><><><><><><><><><><><><><><><><><><><><><>  - Glucose = 159 mg% is too high             (  Ideal or Goal = 110-120  !  )   - A1c is slightly down from 8.1% to now 7.5% - but still   WAY TOO HIGH   !             (  Ideal or Goal is less than 5.7%  !  )  - Will try start shots for Ozempic 1n x /week to help                                                            with weight loss & better Diabetic control   <><><><><><><><><><><><><><><><><><><><><><><><><><><><><><><><><> <><><><><><><><><><><><><><><><><><><><><><><><><><><><><><><><><>  -  Start with 0.25 mg  shot 1 x  /week & then after the 4th shot,                                               please call in & report if & how much weight loss, so   - can decide how to increase the dose every 4 weeks until losing 10-12 # / month   <><><><><><><><><><><><><><><><><><><><><><><><><><><><><><><><><> <><><><><><><><><><><><><><><><><><><><><><><><><><><><><><><><><>  - Total Chol = 158  &  LDL Chol - 58  - Both excellent                                                                               - Please continue Atorvastatin same   <><><><><><><><><><><><><><><><><><><><><><><><><><><><><><><><><> <><><><><><><><><><><><><><><><><><><><><><><><><><><><><><><><><>  -  But  . . . . . . . . Triglycerides (   280  ) or fats in blood are too high                                              (   Ideal or  Goal is less than 150  !  )    - Recommend avoid fried & greasy foods,  sweets / candy,   - Avoid white rice  (brown or wild rice or Quinoa is OK),   - Avoid white potatoes  (sweet potatoes are OK)   - Avoid anything made from white flour  - bagels, doughnuts, rolls, buns, biscuits, white and   wheat breads, pizza crust and traditional  pasta made of white flour & egg white  - (vegetarian pasta or spinach or wheat pasta is OK).     - Multi-grain bread is OK - like multi-grain flat bread or  sandwich thins.   - Avoid alcohol in excess.   - Exercise is also important.  - If doesn't improve dramatically -  will need to start a med for Triglycerides (Blood fats)   <><><><><><><><><><><><><><><><><><><><><><><><><><><><><><><><><> <><><><><><><><><><><><><><><><><><><><><><><><><><><><><><><><><>  -  PSA -  Undetectable  - Great !  <><><><><><><><><><><><><><><><><><><><><><><><><><><><><><><><><> <><><><><><><><><><><><><><><><><><><><><><><><><><><><><><><><><>  -  Vitamin D = 53 - "OK"  - Pleas be sure taking your 10,000 units EVERY day   <><><><><><><><><><><><><><><><><><><><><><><><><><><><><><><><><> <><><><><><><><><><><><><><><><><><><><><><><><><><><><><><><><><>  -  All Else - CBC - Kidneys - Electrolytes - Liver - Magnesium & Thyroid    - all  Normal / OK  <><><><><><><><><><><><><><><><><><><><><><><><><><><><><><><><><> <><><><><><><><><><><><><><><><><><><><><><><><><><><><><><><><><>

## 2022-05-04 ENCOUNTER — Other Ambulatory Visit: Payer: Self-pay | Admitting: Internal Medicine

## 2022-05-04 MED ORDER — AZITHROMYCIN 250 MG PO TABS: ORAL_TABLET | ORAL | 1 refills | Status: DC

## 2022-05-13 ENCOUNTER — Ambulatory Visit: Payer: Medicare Other | Admitting: Adult Health

## 2022-05-13 VITALS — BP 122/70 | HR 75 | Ht 70.0 in | Wt 220.6 lb

## 2022-05-13 DIAGNOSIS — G4733 Obstructive sleep apnea (adult) (pediatric): Secondary | ICD-10-CM

## 2022-05-13 NOTE — Progress Notes (Signed)
PATIENT: Michael Guzman DOB: [birth year]  REASON FOR VISIT: follow up HISTORY FROM: patient PRIMARY NEUROLOGIST: Dr. Brett Fairy  Chief Complaint  Patient presents with   Follow-up    Rm 19, alone. Restart cpap process all over, did not use due to wife passing.      HISTORY OF PRESENT ILLNESS: Today 05/13/22:  Michael Guzman is a 76 year old male with a history of obstructive sleep apnea.  He returns today for follow-up.  The patient wishes to get started back on CPAP.  He states prior to that he had a hard time using the CPAP because his wife was sick and in and out of the hospital.  His wife has recently passed therefore he feels that he could now use his CPAP consistently.  11/11/21: Michael Guzman is a 76 year old male with a history of obstructive sleep apnea on CPAP.  He returns today for follow-up.  His CPAP download indicates that he only uses machine 9 out of the last 30 days for compliance of 30%.  He reports that he has not been using the machine consistently because his wife has been in the hospital.  He states that he also sweats a lot at night and finds it hard to use the mask.  Reports that he is already called his DME company and they have turned off his heated tubing and turn down his humidity.  The patient does want to use the CPAP.  Not interested in inspire device    REVIEW OF SYSTEMS: Out of a complete 14 system review of symptoms, the patient complains only of the following symptoms, and all other reviewed systems are negative.    ALLERGIES: No Active Allergies  HOME MEDICATIONS: Outpatient Medications Prior to Visit  Medication Sig Dispense Refill   Ascorbic Acid (VITAMIN C) 100 MG tablet Take 100 mg by mouth daily.     aspirin EC 81 MG tablet Take 1 tablet (81 mg total) by mouth daily. 90 tablet 3   atorvastatin (LIPITOR) 10 MG tablet TAKE 1 TABLET BY MOUTH  DAILY 90 tablet 3   Blood Glucose Monitoring Suppl (ONE TOUCH ULTRA 2) w/Device KIT Use as directed 1  kit 0   buPROPion (WELLBUTRIN XL) 300 MG 24 hr tablet Take  1 tablet  Daily for Mood , Focus & Concentration                              /                     TAKE                        BY                            MOUTH 90 tablet 3   Cholecalciferol (VITAMIN D3) 125 MCG (5000 UT) CAPS Take 10,000 Units by mouth daily.      docusate sodium (COLACE) 100 MG capsule Take 1 capsule (100 mg total) by mouth 2 (two) times daily. 30 capsule 0   furosemide (LASIX) 40 MG tablet Take 1 tablet 2 x /day as needed  for Fluid Retention 180 tablet 3   gabapentin (NEURONTIN) 800 MG tablet TAKE 1 TABLET BY MOUTH 3 TO 4 TIMES DAILY FOR PAIN, HOT FLASHES AND SWEATS 360 tablet 3   glucose blood (ONETOUCH ULTRA)  test strip CHECK BLOOD SUGAR 3 TIMES  DAILY 300 strip 3   glycopyrrolate (ROBINUL) 2 MG tablet Take  1 tablet  3 x /day  for Excessive Sweating 90 tablet 11   insulin NPH-regular Human (HUMULIN 70/30) (70-30) 100 UNIT/ML injection INJECT SUBCUTANEOUSLY 50  UNITS TWICE DAILY 90 mL 3   Insulin Syringes, Disposable, U-100 1 ML MISC 50 Units by Does not apply route 2 (two) times daily. inject 50 units into skin 2 x /day 200 each 3   Lancets (ONETOUCH DELICA PLUS [id number]) MISC CHECK BLOOD SUGAR 3 TIMES  DAILY 300 each 3   latanoprost (XALATAN) 0.005 % ophthalmic solution Place 1 drop into both eyes at bedtime.     Magnesium 250 MG TABS Take 250 mg by mouth daily.      metFORMIN (GLUCOPHAGE-XR) 500 MG 24 hr tablet TAKE 2 TABLETS BY MOUTH  TWICE DAILY WITH MEALS FOR  DIABETES 360 tablet 3   metoprolol succinate (TOPROL-XL) 25 MG 24 hr tablet TAKE ONE-HALF TABLET BY  MOUTH DAILY 45 tablet 3   montelukast (SINGULAIR) 10 MG tablet TAKE 1 TABLET BY MOUTH  DAILY FOR ALLERGIES 90 tablet 3   Multiple Vitamin (MULTIVITAMIN WITH MINERALS) TABS tablet Take 1 tablet by mouth daily.     nitroGLYCERIN (NITROSTAT) 0.4 MG SL tablet PLACE 1 TABLET UNDER THE TONGUE EVERY 5 MINUTES AS NEEDED FOR CHEST PAIN 25 tablet 6    pantoprazole (PROTONIX) 40 MG tablet TAKE 1 TABLET BY MOUTH  DAILY TO PREVENT HEARTBURN  AND INDIGESTION 90 tablet 3   Polyethyl Glycol-Propyl Glycol (SYSTANE) 0.4-0.3 % SOLN Place 2 drops into both eyes daily as needed (for dry eyes).     tamsulosin (FLOMAX) 0.4 MG CAPS capsule Take 0.4 mg by mouth at bedtime.     Vitamin E 400 units TABS Take by mouth. Taking 800 IU a day     Zinc 30 MG TABS Take 30 mg by mouth daily.     Semaglutide,0.25 or 0.5MG /DOS, 2 MG/3ML SOPN Inject 0.25 mg into Skin every 7 days for Diabetes  (  Dx: e10.29 ) (Patient not taking: Reported on 05/13/2022) 3 mL 0   azithromycin (ZITHROMAX) 250 MG tablet Take 2 tablets with Food on  Day 1, then 1 tablet Daily with Food for Sinusitis / Bronchitis (Patient not taking: Reported on 05/13/2022) 6 each 1   No facility-administered medications prior to visit.    PAST MEDICAL HISTORY: Past Medical History:  Diagnosis Date   Arthritis    CAD (coronary artery disease)    a.s/p DES to mid LAD and OM2 06/2012.   Chronic back pain    Chronically dry eyes    Diabetes mellitus without complication (HCC)    TYPE 2    DKA (diabetic ketoacidoses) 01/30/2017   Elevated PSA    being monitored   Fatty liver    GERD (gastroesophageal reflux disease)    Hx of radiation therapy    prostate , [facility]    Hypertension    Hypertriglyceridemia    Intermediate coronary syndrome ([place]) 07/26/2012   Prostate cancer ([place])    RADTIATION     PAST SURGICAL HISTORY: Past Surgical History:  Procedure Laterality Date   APPENDECTOMY     CARDIAC SURGERY  ~2017   3 stent placed.   COLONOSCOPY  02/12/2010   [facility]   LEFT HEART CATHETERIZATION WITH CORONARY ANGIOGRAM N/A 06/27/2012   Procedure: LEFT HEART CATHETERIZATION WITH CORONARY ANGIOGRAM;  Surgeon: Candee Furbish, MD;  Location: [facility];  Service: Cardiovascular;  Laterality: N/A;   LEFT HEART CATHETERIZATION WITH CORONARY ANGIOGRAM N/A 07/25/2012   Procedure: LEFT  HEART CATHETERIZATION WITH CORONARY ANGIOGRAM;  Surgeon: Sueanne Margarita, MD;  Location: [facility];  Service: Cardiovascular;  Laterality: N/A;   PROSTATE BIOPSY     RADIOLOGY WITH ANESTHESIA N/A 12/03/2015   Procedure: MRI LUMBAR SPINE;  Surgeon: Medication Radiologist, MD;  Location: [facility];  Service: Radiology;  Laterality: N/A;   TOTAL HIP ARTHROPLASTY Right 04/28/2019   Procedure: TOTAL HIP ARTHROPLASTY ANTERIOR APPROACH;  Surgeon: Dorna Leitz, MD;  Location: [facility];  Service: Orthopedics;  Laterality: Right;    FAMILY HISTORY: Family History  Problem Relation Age of Onset   Diabetes Sister     SOCIAL HISTORY: Social History   Socioeconomic History   Marital status: Married    Spouse name: Not on file   Number of children: 4   Years of education: 2 y colleg   Highest education level: Not on file  Occupational History   Occupation: SUPERVISOR    Employer: [employer]  Tobacco Use   Smoking status: Former    Packs/day: 0.75    Years: 50.00    Total pack years: 37.50    Types: Cigarettes    Start date: 34    Quit date: 07/27/2011    Years since quitting: 10.8   Smokeless tobacco: Never  Substance and Sexual Activity   Alcohol use: Not Currently    Comment: qut 20 years ago per patient    Drug use: No   Sexual activity: Not on file  Other Topics Concern   Not on file  Social History Narrative   Works as    Investment banker, 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  Generalized: Well developed, in no acute distress  Chest: Lungs clear to auscultation bilaterally  Neurological  examination  Mentation: Alert oriented to time, place, history taking. Follows all commands speech and language fluent Cranial nerve II-XII: Extraocular movements were full, visual field were full on confrontational test Head turning and shoulder shrug  were normal and symmetric. Motor: The motor testing reveals 5 over 5 strength of all 4 extremities. Good symmetric motor tone is noted throughout.  Sensory: Sensory testing is intact to soft touch on all 4 extremities. No evidence of extinction is noted.  Gait and station: Gait is normal.    DIAGNOSTIC DATA (LABS, IMAGING, TESTING) - I reviewed patient records, labs, notes, testing and imaging myself where available.  Lab Results  Component Value Date   WBC 6.4 04/30/2022   HGB 12.8 (L) 04/30/2022   HCT 38.4 (L) 04/30/2022   MCV 89.5 04/30/2022   PLT 168 04/30/2022      Component Value Date/Time   NA 135 04/30/2022 1506   K 4.6 04/30/2022 1506   CL 100 04/30/2022 1506   CO2 26 04/30/2022 1506   GLUCOSE 159 (H) 04/30/2022 1506   BUN 11 04/30/2022 1506   CREATININE 1.19 04/30/2022 1506   CALCIUM  9.8 04/30/2022 1506   PROT 6.9 04/30/2022 1506   ALBUMIN 4.1 01/11/2021 1433   AST 24 04/30/2022 1506   ALT 31 04/30/2022 1506   ALKPHOS 55 01/11/2021 1433   BILITOT 0.4 04/30/2022 1506   GFRNONAA 68 01/21/2021 0923   GFRAA 78 01/21/2021 0923   Lab Results  Component Value Date   CHOL 138 04/30/2022   HDL 47 04/30/2022   LDLCALC 58 04/30/2022   LDLDIRECT 45.7 10/30/2013   TRIG 280 (H) 04/30/2022   CHOLHDL 2.9 04/30/2022   Lab Results  Component Value Date   HGBA1C 7.5 (H) 04/30/2022   Lab Results  Component Value Date   VITAMINB12 703 12/19/2019   Lab Results  Component Value Date   TSH 0.93 04/30/2022      ASSESSMENT AND PLAN 76 y.o. year old male  has a past medical history of Arthritis, CAD (coronary artery disease), Chronic back pain, Chronically dry eyes, Diabetes mellitus without complication ([place]), DKA  (diabetic ketoacidoses) (01/30/2017), Elevated PSA, Fatty liver, GERD (gastroesophageal reflux disease), radiation therapy, Hypertension, Hypertriglyceridemia, Intermediate coronary syndrome ([place]) (07/26/2012), and Prostate cancer ([place]). here with:  OSA on CPAP  -Order sent to restart CPAP therapy -Last home sleep test was within the last year.  Hopefully he does not have to repeat this -Follow-up once he restarts CPAP therapy  Ward Givens, MSN, NP-C 05/13/2022, 11:09 AM [facility] [address] [place], [place] [address] [phone]

## 2022-05-13 NOTE — Progress Notes (Addendum)
Order to restart CPAP sent to [facility].   Update: [employer] confirmed receipt on 05/15/22.

## 2022-05-21 ENCOUNTER — Encounter: Payer: Self-pay | Admitting: Internal Medicine

## 2022-05-25 ENCOUNTER — Telehealth: Payer: Self-pay | Admitting: Adult Health

## 2022-05-25 DIAGNOSIS — M5451 Vertebrogenic low back pain: Secondary | ICD-10-CM | POA: Diagnosis not present

## 2022-05-25 NOTE — Telephone Encounter (Signed)
Pt is calling. Stated he want to know if he had to redo sleep study or can the old one be used.

## 2022-05-25 NOTE — Telephone Encounter (Signed)
I sent message to [facility] with new order in Epic.  Restart therapy.

## 2022-05-27 NOTE — Telephone Encounter (Signed)
Message has been sent to [facility] providing update that we have a F2F visit from 05/13/22 and also that an HST has been ordered.

## 2022-05-27 NOTE — Telephone Encounter (Signed)
I called pt and relayed that per DME, since it is a start over, he will need another sleep test prior to restart.  He was ok with this.  Order to be sent for SS HST then will sleep lab to contact him.

## 2022-05-27 NOTE — Telephone Encounter (Signed)
New, Willodean Rosenthal, RN; Redmond Pulling, Blima Dessert, [facility],   Please see note from our intake team regarding order request:   (patient got cpap 09-22-21, returned 01-29-22, now needs cpap, he will have to have a new sleep study.  Requested: needs new sleep study and then new rx, and [id number] explaining why he returned machine, and that he now needs it again-)   Thank you,   BDemetrius Charity

## 2022-05-27 NOTE — Telephone Encounter (Signed)
signed

## 2022-05-27 NOTE — Addendum Note (Signed)
Addended by: Brandon Melnick on: 05/27/2022 01:37 PM   Modules accepted: Orders

## 2022-06-16 ENCOUNTER — Encounter: Payer: Self-pay | Admitting: Gastroenterology

## 2022-06-16 ENCOUNTER — Other Ambulatory Visit (INDEPENDENT_AMBULATORY_CARE_PROVIDER_SITE_OTHER): Payer: Medicare Other

## 2022-06-16 ENCOUNTER — Other Ambulatory Visit (HOSPITAL_BASED_OUTPATIENT_CLINIC_OR_DEPARTMENT_OTHER): Payer: Self-pay | Admitting: Cardiology

## 2022-06-16 ENCOUNTER — Other Ambulatory Visit: Payer: Self-pay | Admitting: Nurse Practitioner

## 2022-06-16 ENCOUNTER — Ambulatory Visit: Payer: Medicare Other | Admitting: Gastroenterology

## 2022-06-16 VITALS — BP 118/82 | HR 75 | Ht 70.0 in | Wt 220.0 lb

## 2022-06-16 DIAGNOSIS — Z86010 Personal history of colonic polyps: Secondary | ICD-10-CM

## 2022-06-16 DIAGNOSIS — R933 Abnormal findings on diagnostic imaging of other parts of digestive tract: Secondary | ICD-10-CM | POA: Diagnosis not present

## 2022-06-16 DIAGNOSIS — E119 Type 2 diabetes mellitus without complications: Secondary | ICD-10-CM

## 2022-06-16 DIAGNOSIS — G8918 Other acute postprocedural pain: Secondary | ICD-10-CM | POA: Diagnosis not present

## 2022-06-16 DIAGNOSIS — D123 Benign neoplasm of transverse colon: Secondary | ICD-10-CM | POA: Diagnosis not present

## 2022-06-16 DIAGNOSIS — M159 Polyosteoarthritis, unspecified: Secondary | ICD-10-CM

## 2022-06-16 DIAGNOSIS — R61 Generalized hyperhidrosis: Secondary | ICD-10-CM

## 2022-06-16 LAB — CBC
HCT: 38.4 % — ABNORMAL LOW (ref 39.0–52.0)
Hemoglobin: 13 g/dL (ref 13.0–17.0)
MCHC: 34 g/dL (ref 30.0–36.0)
MCV: 89.7 fl (ref 78.0–100.0)
Platelets: 184 10*3/uL (ref 150.0–400.0)
RBC: 4.27 Mil/uL (ref 4.22–5.81)
RDW: 13.4 % (ref 11.5–15.5)
WBC: 5.8 10*3/uL (ref 4.0–10.5)

## 2022-06-16 LAB — PROTIME-INR
INR: 1 ratio (ref 0.8–1.0)
Prothrombin Time: 10.7 s (ref 9.6–13.1)

## 2022-06-16 MED ORDER — ONDANSETRON HCL 8 MG PO TABS: 8.0000 mg | ORAL_TABLET | Freq: Three times a day (TID) | ORAL | 0 refills | Status: DC | PRN

## 2022-06-16 MED ORDER — NA SULFATE-K SULFATE-MG SULF 17.5-3.13-1.6 GM/177ML PO SOLN: 1.0000 | ORAL | 0 refills | Status: DC

## 2022-06-16 NOTE — Patient Instructions (Addendum)
Your provider has requested that you go to the basement level for lab work before leaving today. Press "B" on the elevator. The lab is located at the first door on the left as you exit the elevator.  We have sent the following medications to your pharmacy for you to pick up at your convenience: Suprep and Zofran   You have been scheduled for a colonoscopy. Please follow written instructions given to you at your visit today.  Please pick up your prep supplies at the pharmacy within the next 1-3 days. If you use inhalers (even only as needed), please bring them with you on the day of your procedure.  Zofran - take 1 tablet by mouth 30 minutes before starting colonoscopy preparation.   Start Miralax - 1 capful daily for 7 days prior to procedure.  _______________________________________________________  If you are age 46 or older, your body mass index should be between 23-30. Your Body mass index is 31.57 kg/m. If this is out of the aforementioned range listed, please consider follow up with your Primary Care Provider.  If you are age 36 or younger, your body mass index should be between 19-25. Your Body mass index is 31.57 kg/m. If this is out of the aformentioned range listed, please consider follow up with your Primary Care Provider.   ________________________________________________________  The [facility] providers would like to encourage you to use [facility] to communicate with providers for non-urgent requests or questions.  Due to long hold times on the telephone, sending your provider a message by [facility] may be a faster and more efficient way to get a response.  Please allow 48 business hours for a response.  Please remember that this is for non-urgent requests.  _______________________________________________________  Due to recent changes in healthcare laws, you may see the results of your imaging and laboratory studies on MyChart before your provider has had a chance to review them.   We understand that in some cases there may be results that are confusing or concerning to you. Not all laboratory results come back in the same time frame and the provider may be waiting for multiple results in order to interpret others.  Please give [place] 48 hours in order for your provider to thoroughly review all the results before contacting the office for clarification of your results.   Thank you for choosing me and [facility].  Dr. Rush Landmark

## 2022-06-16 NOTE — Progress Notes (Unsigned)
[facility] VISIT   Primary Care Provider [name], MD [address] [place] [address] [phone]  Referring Provider Dr. Alessandra Bevels   Patient Profile: VONZELL LINDBLAD is a 76 y.o. male with a pmh significant for CAD, hypertension, hyperlipidemia, diabetes, prostate cancer (status post XRT), GERD, colon polyps (TA's and TA in TC left in situ).  The patient presents to the [facility] for an evaluation and management of problem(s) noted below:  Problem List No diagnosis found.  History of Present Illness This is the patient's first visit to the outpatient [facility] clinic.   The patient does/does not take NSAIDs or BC/Goody Powder. Patient has/has not had an EGD. Patient has/has not had a Colonoscopy.  GI Review of Systems Positive as above Negative for  Pyrosis; Reflux; Regurgitation; Dysphagia; Odynophagia; Globus; Post-prandial cough; Nocturnal cough; Nasal regurgitation; Epigastric pain; Nausea; Vomiting; Hematemesis; Jaundice; Change in Appetite; Early satiety; Abdominal pain; Abdominal bloating; Eructation; Flatulence; Change in BM Frequency; Change in BM Consistency; Constipation; Diarrhea; Incontinence; Urgency; Tenesmus; Hematochezia; Melena  Review of Systems General: Denies fevers/chills/weight loss/night sweats HEENT: Denies oral lesions/sore throat/headaches/visual changes Cardiovascular: Denies chest pain/palpitations Pulmonary: Denies shortness of breath/cough Gastroenterological: See HPI Genitourinary: Denies darkened urine or hematuria Hematological: Denies easy bruising/bleeding Endocrine: Denies temperature intolerance Dermatological: Denies skin changes Psychological: Mood is stable Allergy & Immunology: Denies severe allergic reactions Musculoskeletal: Denies new arthralgias   Medications Current Outpatient Medications  Medication Sig Dispense Refill   Ascorbic Acid  (VITAMIN C) 100 MG tablet Take 100 mg by mouth daily.     aspirin EC 81 MG tablet Take 1 tablet (81 mg total) by mouth daily. 90 tablet 3   atorvastatin (LIPITOR) 10 MG tablet TAKE 1 TABLET BY MOUTH  DAILY 90 tablet 3   Blood Glucose Monitoring Suppl (ONE TOUCH ULTRA 2) w/Device KIT Use as directed 1 kit 0   buPROPion (WELLBUTRIN XL) 300 MG 24 hr tablet Take  1 tablet  Daily for Mood , Focus & Concentration                              /                     TAKE                        BY                            MOUTH 90 tablet 3   Cholecalciferol (VITAMIN D3) 125 MCG (5000 UT) CAPS Take 10,000 Units by mouth daily.      docusate sodium (COLACE) 100 MG capsule Take 1 capsule (100 mg total) by mouth 2 (two) times daily. 30 capsule 0   furosemide (LASIX) 40 MG tablet Take 1 tablet 2 x /day as needed  for Fluid Retention 180 tablet 3   gabapentin (NEURONTIN) 800 MG tablet TAKE 1 TABLET BY MOUTH 3 TO 4  TIMES DAILY FOR PAIN, HOT  FLASHES AND SWEATS 360 tablet 3   glucose blood (ONETOUCH ULTRA) test strip CHECK BLOOD SUGAR 3 TIMES  DAILY 300 strip 3   glycopyrrolate (ROBINUL) 2 MG tablet Take  1 tablet  3 x /day  for Excessive Sweating 90 tablet 11   insulin NPH-regular Human (HUMULIN 70/30) (70-30) 100 UNIT/ML injection INJECT SUBCUTANEOUSLY 50  UNITS TWICE DAILY 90 mL 3   Insulin Syringes, Disposable, U-100 1 ML MISC 50 Units by Does not apply route 2 (two) times daily. inject 50 units into skin 2 x /day 200 each 3   Lancets (ONETOUCH DELICA PLUS [id number]) MISC CHECK BLOOD SUGAR 3 TIMES  DAILY 300 each 3   latanoprost (XALATAN) 0.005 % ophthalmic solution Place 1 drop into both eyes at bedtime.     Magnesium 250 MG TABS Take 250 mg by mouth daily.      metFORMIN (GLUCOPHAGE-XR) 500 MG 24 hr tablet TAKE 2 TABLETS BY MOUTH TWICE  DAILY WITH MEALS FOR DIABETES 360 tablet 3   metoprolol succinate (TOPROL-XL) 25 MG 24 hr tablet Take 0.5 tablets (12.5 mg total) by mouth daily. Please call [phone] to  schedule an appointment with Dr. Candee Furbish for future refills. Thank you. 1st attempt. 45 tablet 0   montelukast (SINGULAIR) 10 MG tablet TAKE 1 TABLET BY MOUTH  DAILY FOR ALLERGIES 90 tablet 3   Multiple Vitamin (MULTIVITAMIN WITH MINERALS) TABS tablet Take 1 tablet by mouth daily.     nitroGLYCERIN (NITROSTAT) 0.4 MG SL tablet PLACE 1 TABLET UNDER THE TONGUE EVERY 5 MINUTES AS NEEDED FOR CHEST PAIN 25 tablet 6   pantoprazole (PROTONIX) 40 MG tablet TAKE 1 TABLET BY MOUTH  DAILY TO PREVENT HEARTBURN  AND INDIGESTION 90 tablet 3   Polyethyl Glycol-Propyl Glycol (SYSTANE) 0.4-0.3 % SOLN Place 2 drops into both eyes daily as needed (for dry eyes).     Semaglutide,0.25 or 0.5MG/DOS, 2 MG/3ML SOPN Inject 0.25 mg into Skin every 7 days for Diabetes  (  Dx: e10.29 ) (Patient not taking: Reported on 05/13/2022) 3 mL 0   tamsulosin (FLOMAX) 0.4 MG CAPS capsule Take 0.4 mg by mouth at bedtime.     Vitamin E 400 units TABS Take by mouth. Taking 800 IU a day     Zinc 30 MG TABS Take 30 mg by mouth daily.     No current facility-administered medications for this visit.    Allergies No Active Allergies  Histories Past Medical History:  Diagnosis Date   Arthritis    CAD (coronary artery disease)    a.s/p DES to mid LAD and OM2 06/2012.   Chronic back pain    Chronically dry eyes    Diabetes mellitus without complication (HCC)    TYPE 2    DKA (diabetic ketoacidoses) 01/30/2017   Elevated PSA    being monitored   Fatty liver    GERD (gastroesophageal reflux disease)    Hx of radiation therapy    prostate , [facility]    Hypertension    Hypertriglyceridemia    Intermediate coronary syndrome ([place]) 07/26/2012   Prostate cancer ([place])    RADTIATION    Past Surgical History:  Procedure Laterality Date   APPENDECTOMY     CARDIAC SURGERY  ~2017   3 stent placed.   COLONOSCOPY  02/12/2010   [facility]   LEFT HEART CATHETERIZATION WITH CORONARY ANGIOGRAM N/A 06/27/2012   Procedure:  LEFT HEART CATHETERIZATION WITH CORONARY ANGIOGRAM;  Surgeon: Candee Furbish, MD;  Location: [facility] CATH LAB;  Service: Cardiovascular;  Laterality: N/A;   LEFT HEART CATHETERIZATION WITH CORONARY ANGIOGRAM N/A 07/25/2012   Procedure: LEFT HEART CATHETERIZATION WITH CORONARY ANGIOGRAM;  Surgeon: Sueanne Margarita, MD;  Location: [facility] CATH LAB;  Service: Cardiovascular;  Laterality: N/A;   PROSTATE BIOPSY     RADIOLOGY WITH ANESTHESIA N/A 12/03/2015   Procedure: MRI  LUMBAR SPINE;  Surgeon: Medication Radiologist, MD;  Location: [facility];  Service: Radiology;  Laterality: N/A;   TOTAL HIP ARTHROPLASTY Right 04/28/2019   Procedure: TOTAL HIP ARTHROPLASTY ANTERIOR APPROACH;  Surgeon: Dorna Leitz, MD;  Location: [facility] ORS;  Service: Orthopedics;  Laterality: Right;   Social History   Socioeconomic History   Marital status: Married    Spouse name: Not on file   Number of children: 4   Years of education: 2 y colleg   Highest education level: Not on file  Occupational History   Occupation: SUPERVISOR    Employer: [employer]  Tobacco Use   Smoking status: Former    Packs/day: 0.75    Years: 50.00    Total pack years: 37.50    Types: Cigarettes    Start date: 79    Quit date: 07/27/2011    Years since quitting: 10.8   Smokeless tobacco: Never  Substance and Sexual Activity   Alcohol use: Not Currently    Comment: qut 20 years ago per patient    Drug use: No   Sexual activity: Not on file  Other Topics Concern   Not on file  Social History Narrative   Works as    Investment banker, 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    PHYSICAL EXAMINATION  There were no vitals taken for this visit. Wt Readings from Last 3 Encounters:  05/13/22 220 lb 9.6 oz (100.1 kg)  04/30/22 221 lb (100.2 kg)  01/28/22 225 lb (102.1 kg)   GEN: NAD, appears stated age, doesn't appear chronically ill PSYCH: Cooperative, without pressured speech EYE: Conjunctivae pink, sclerae anicteric ENT: MMM, without oral ulcers, no erythema or exudates noted NECK: Supple CV: RR without R/Gs  RESP: CTAB posteriorly, without wheezing GI: NABS, soft, NT/ND, without rebound or guarding, no HSM appreciated GU: DRE shows MSK/EXT: _ edema, no palmar erythema SKIN: No jaundice, no spider angiomata, no concerning rashes NEURO:  Alert & Oriented x 3, no focal deficits, no evidence of asterixis   REVIEW OF DATA  I reviewed the following data at the time of this encounter:  GI Procedures and Studies  July 2023 colonoscopy Preparation of the colon was fair. There was significant looping of the colon. 1, 6 mm polyp in the distal ascending colon, removed with cold snare. 1, 30 mm polyp in the distal transverse colon.  Biopsied.  Tattooed. 1, 6 mm polyp in the rectum, removed with a cold snare.  Pathology Polyps-tubular adenoma/hyperplastic polyp Biopsy-tubular adenoma  Laboratory Studies  Reviewed those in epic  Imaging Studies  No relevant studies to review   ASSESSMENT  Mr. Grime is a 76 y.o. male with a pmh significant for The patient is seen today for evaluation and management of:  No diagnosis found.  The patient is hemodynamically stable.  Based upon the description and endoscopic pictures I do feel that it is reasonable to pursue an Advanced Polypectomy attempt of the polyp/lesion.  We discussed some of the techniques of advanced polypectomy which include Endoscopic Mucosal Resection, OVESCO  Full-Thickness Resection, Endorotor Morcellation, and Tissue Ablation via Fulguration.  We also reviewed  images of typical techniques as noted above.  The risks and benefits of endoscopic evaluation were discussed with the patient; these include but are not limited to the risk of perforation, infection, bleeding, missed lesions, lack of diagnosis, severe illness requiring hospitalization, as well as anesthesia and sedation related illnesses.  During attempts at advanced resection, the risks of bleeding and perforation/leak are increased as opposed to diagnostic and screening procedures, and that was discussed with the patient as well.   In addition, I explained that with the possible need for piecemeal resection, subsequent short-interval endoscopic evaluation for follow up and potential retreatment of the lesion/area may be necessary.  I did offer, a referral to surgery in order for patient to have opportunity to discuss surgical management/intervention prior to finalizing decision for attempt at endoscopic removal, however, the patient deferred on this.  If, after attempt at removal of the polyp/lesion, it is found that the patient has a complication or that an invasive lesion or malignant lesion is found, or that the polyp/lesion continues to recur, the patient is aware and understands that surgery may still be indicated/required.  All patient questions were answered, to the best of my ability, and the patient agrees to the aforementioned plan of action with follow-up as indicated.   PLAN  Your labs to be obtained Proceed with scheduling colonoscopy with EMR attempt Follow-up to be dictated by results of colon [place]   No orders of the defined types were placed in this encounter.   New Prescriptions   No medications on file   Modified Medications   No medications on file    Planned Follow Up No follow-ups on file.   Total Time in Face-to-Face and in Coordination of Care for patient including independent/personal interpretation/review of prior testing, medical history, examination, medication  adjustment, communicating results with the patient directly, and documentation within the EHR is 45 minutes.   Justice Britain, MD [facility] Advanced Endoscopy Office # [phone]

## 2022-06-17 ENCOUNTER — Telehealth: Payer: Self-pay | Admitting: Internal Medicine

## 2022-06-17 LAB — BASIC METABOLIC PANEL
BUN: 15 mg/dL (ref 6–23)
CO2: 25 mEq/L (ref 19–32)
Calcium: 9.9 mg/dL (ref 8.4–10.5)
Chloride: 104 mEq/L (ref 96–112)
Creatinine, Ser: 1.24 mg/dL (ref 0.40–1.50)
GFR: 57.06 mL/min — ABNORMAL LOW (ref >60.00)
Glucose, Bld: 74 mg/dL (ref 70–99)
Potassium: 4.1 mEq/L (ref 3.5–5.1)
Sodium: 139 mEq/L (ref 135–145)

## 2022-06-17 NOTE — Progress Notes (Signed)
  Chronic Care Management   Note  06/17/2022 Name: MANDEEP KISER MRN: [id number] DOB: 1945/12/17  [name] is a 76 y.o. year old male who is a primary care patient of [name], MD. I reached out to [name] by phone today in response to a referral sent by Mr. Kelen Laura Owczarzak's PCP, [name], MD.   Mr. Stith was given information about Chronic Care Management services today including:  CCM service includes personalized support from designated clinical staff supervised by his physician, including individualized plan of care and coordination with other care providers 24/7 contact phone numbers for assistance for urgent and routine care needs. Service will only be billed when office clinical staff spend 20 minutes or more in a month to coordinate care. Only one practitioner may furnish and bill the service in a calendar month. The patient may stop CCM services at any time (effective at the end of the month) by phone call to the office staff.   Patient agreed to services and verbal consent obtained.   Follow up plan:no copay   Tatjana Dellinger Upstream Scheduler

## 2022-06-18 ENCOUNTER — Encounter: Payer: Self-pay | Admitting: Gastroenterology

## 2022-06-18 DIAGNOSIS — G8918 Other acute postprocedural pain: Secondary | ICD-10-CM | POA: Insufficient documentation

## 2022-06-18 DIAGNOSIS — Z86010 Personal history of colonic polyps: Secondary | ICD-10-CM | POA: Insufficient documentation

## 2022-06-18 DIAGNOSIS — D123 Benign neoplasm of transverse colon: Secondary | ICD-10-CM | POA: Insufficient documentation

## 2022-06-18 DIAGNOSIS — R933 Abnormal findings on diagnostic imaging of other parts of digestive tract: Secondary | ICD-10-CM | POA: Insufficient documentation

## 2022-06-19 ENCOUNTER — Telehealth: Payer: Self-pay

## 2022-06-19 NOTE — Telephone Encounter (Signed)
LM-06/19/22-Chart prep started. Reviewing OV, consults, ED visits, labs, and medications. Chart prep completed for upcoming CP visit.  Total time spent: 58 min.

## 2022-06-23 ENCOUNTER — Other Ambulatory Visit: Payer: Self-pay | Admitting: Internal Medicine

## 2022-06-23 DIAGNOSIS — H524 Presbyopia: Secondary | ICD-10-CM | POA: Diagnosis not present

## 2022-06-23 DIAGNOSIS — E119 Type 2 diabetes mellitus without complications: Secondary | ICD-10-CM | POA: Diagnosis not present

## 2022-06-23 DIAGNOSIS — Z794 Long term (current) use of insulin: Secondary | ICD-10-CM | POA: Diagnosis not present

## 2022-06-23 DIAGNOSIS — H5201 Hypermetropia, right eye: Secondary | ICD-10-CM | POA: Diagnosis not present

## 2022-06-23 DIAGNOSIS — H2513 Age-related nuclear cataract, bilateral: Secondary | ICD-10-CM | POA: Diagnosis not present

## 2022-06-23 DIAGNOSIS — H52203 Unspecified astigmatism, bilateral: Secondary | ICD-10-CM | POA: Diagnosis not present

## 2022-06-23 DIAGNOSIS — H401131 Primary open-angle glaucoma, bilateral, mild stage: Secondary | ICD-10-CM | POA: Diagnosis not present

## 2022-06-23 DIAGNOSIS — H25013 Cortical age-related cataract, bilateral: Secondary | ICD-10-CM | POA: Diagnosis not present

## 2022-06-23 LAB — HM DIABETES EYE EXAM

## 2022-06-30 NOTE — Telephone Encounter (Signed)
LM-06/30/22-Updated chart prep for upcoming CP visit and created care plan.  Total time spent: 10 min.

## 2022-06-30 NOTE — Progress Notes (Unsigned)
Office Visit    Patient Name: Michael Guzman Date of Encounter: 06/30/2022  Primary Care Provider:  [name], MD Primary Cardiologist:  Candee Furbish, MD Primary Electrophysiologist: None  Chief Complaint    Michael Guzman is a 76 y.o. male with PMH of CAD s/p DES to mid LAD and obtuse marginal in 2013, HTN, the IDDM, HLD, GERD, arthritis, obesity who presents today for follow-up of coronary artery disease.  Past Medical History    Past Medical History:  Diagnosis Date   Arthritis    CAD (coronary artery disease)    a.s/p DES to mid LAD and OM2 06/2012.   Chronic back pain    Chronically dry eyes    Diabetes mellitus without complication (HCC)    TYPE 2    DKA (diabetic ketoacidoses) 01/30/2017   Elevated PSA    being monitored   Fatty liver    GERD (gastroesophageal reflux disease)    Hx of radiation therapy    prostate , [facility]    Hypertension    Hypertriglyceridemia    Intermediate coronary syndrome ([place]) 07/26/2012   Prostate cancer ([place])    RADTIATION    Past Surgical History:  Procedure Laterality Date   APPENDECTOMY     CARDIAC SURGERY  ~2017   3 stent placed.   COLONOSCOPY  02/12/2010   Buccini   LEFT HEART CATHETERIZATION WITH CORONARY ANGIOGRAM N/A 06/27/2012   Procedure: LEFT HEART CATHETERIZATION WITH CORONARY ANGIOGRAM;  Surgeon: Candee Furbish, MD;  Location: [facility] CATH LAB;  Service: Cardiovascular;  Laterality: N/A;   LEFT HEART CATHETERIZATION WITH CORONARY ANGIOGRAM N/A 07/25/2012   Procedure: LEFT HEART CATHETERIZATION WITH CORONARY ANGIOGRAM;  Surgeon: Sueanne Margarita, MD;  Location: [facility] CATH LAB;  Service: Cardiovascular;  Laterality: N/A;   PROSTATE BIOPSY     RADIOLOGY WITH ANESTHESIA N/A 12/03/2015   Procedure: MRI LUMBAR SPINE;  Surgeon: Medication Radiologist, MD;  Location: [facility];  Service: Radiology;  Laterality: N/A;   TOTAL HIP ARTHROPLASTY Right 04/28/2019   Procedure: TOTAL HIP ARTHROPLASTY ANTERIOR  APPROACH;  Surgeon: Dorna Leitz, MD;  Location: [facility] ORS;  Service: Orthopedics;  Laterality: Right;    Allergies  No Active Allergies  History of Present Illness    Michael Guzman  is a 76 year old male with the above mention past medical history who presents today for follow-up of coronary artery disease.  Michael Guzman was initially seen by Dr. Marlou Porch in 2013 for inpatient consultation of chest pain.  2D echo was completed showing EF of 60% with no RWMA, grade 1 DD with mild MR and mild TR.  Patient had recurrent chest pain and was found to have two-vessel stenosis that was treated with DES x2 with 0% residual stenosis.  Patient had nuclear stress test performed 2017 for atypical chest pain and weakness that showed a medium size area of attenuation with no significant reversible ischemia.  He continued to do well with no anginal complaints.  He was last seen by Dr. Marlou Porch on 06/2021 for annual follow-up.  During visit patient noted doing okay but did have complaint of diaphoresis and some chest pain when lifting heavy objects.  He was being treated for prostate cancer and was undergoing Lupron injections.  He denied any prolonged anginal symptoms.  Blood pressure was well controlled during visit.  No medication changes were made at that time.  Since last being seen in the office patient reports***.  Patient denies chest pain, palpitations, dyspnea,  PND, orthopnea, nausea, vomiting, dizziness, syncope, edema, weight gain, or early satiety.   ***Notes: -Prostate cancer -Patient may benefit from SGLT2 inhibitor Home Medications    Current Outpatient Medications  Medication Sig Dispense Refill   Ascorbic Acid (VITAMIN C) 100 MG tablet Take 100 mg by mouth daily.     aspirin EC 81 MG tablet Take 1 tablet (81 mg total) by mouth daily. 90 tablet 3   atorvastatin (LIPITOR) 10 MG tablet TAKE 1 TABLET BY MOUTH  DAILY 90 tablet 3   Blood Glucose Monitoring Suppl (ONE TOUCH ULTRA 2) w/Device KIT Use  as directed 1 kit 0   buPROPion (WELLBUTRIN XL) 300 MG 24 hr tablet Take  1 tablet  Daily for Mood , Focus & Concentration                              /                     TAKE                        BY                            MOUTH 90 tablet 3   Cholecalciferol (VITAMIN D3) 125 MCG (5000 UT) CAPS Take 10,000 Units by mouth daily.      docusate sodium (COLACE) 100 MG capsule Take 1 capsule (100 mg total) by mouth 2 (two) times daily. 30 capsule 0   furosemide (LASIX) 40 MG tablet Take 1 tablet 2 x /day as needed  for Fluid Retention 180 tablet 3   gabapentin (NEURONTIN) 800 MG tablet TAKE 1 TABLET BY MOUTH 3 TO 4  TIMES DAILY FOR PAIN, HOT  FLASHES AND SWEATS 360 tablet 3   glucose blood (ONETOUCH ULTRA) test strip CHECK BLOOD SUGAR 3 TIMES  DAILY 300 strip 3   glycopyrrolate (ROBINUL) 2 MG tablet Take  1 tablet  3 x /day  for Excessive Sweating 90 tablet 11   HUMULIN 70/30 (70-30) 100 UNIT/ML injection INJECT SUBCUTANEOUSLY 50 UNITS  TWICE DAILY 100 mL 2   Insulin Syringes, Disposable, U-100 1 ML MISC 50 Units by Does not apply route 2 (two) times daily. inject 50 units into skin 2 x /day 200 each 3   Lancets (ONETOUCH DELICA PLUS [id number]) MISC CHECK BLOOD SUGAR 3 TIMES  DAILY 300 each 3   latanoprost (XALATAN) 0.005 % ophthalmic solution Place 1 drop into both eyes at bedtime.     Magnesium 250 MG TABS Take 250 mg by mouth daily.      metFORMIN (GLUCOPHAGE-XR) 500 MG 24 hr tablet TAKE 2 TABLETS BY MOUTH TWICE  DAILY WITH MEALS FOR DIABETES 360 tablet 3   metoprolol succinate (TOPROL-XL) 25 MG 24 hr tablet Take 0.5 tablets (12.5 mg total) by mouth daily. Please call [phone] to schedule an appointment with Dr. Candee Furbish for future refills. Thank you. 1st attempt. 45 tablet 0   montelukast (SINGULAIR) 10 MG tablet TAKE 1 TABLET BY MOUTH  DAILY FOR ALLERGIES 90 tablet 3   Multiple Vitamin (MULTIVITAMIN WITH MINERALS) TABS tablet Take 1 tablet by mouth daily.     Na Sulfate-K Sulfate-Mg  Sulf (SUPREP BOWEL PREP KIT) 17.5-3.13-1.6 GM/177ML SOLN Take 1 kit by mouth as directed. For colonoscopy prep 354 mL 0   nitroGLYCERIN (NITROSTAT) 0.4  MG SL tablet PLACE 1 TABLET UNDER THE TONGUE EVERY 5 MINUTES AS NEEDED FOR CHEST PAIN 25 tablet 6   ondansetron (ZOFRAN) 8 MG tablet Take 1 tablet (8 mg total) by mouth every 8 (eight) hours as needed for nausea or vomiting. 6 tablet 0   pantoprazole (PROTONIX) 40 MG tablet TAKE 1 TABLET BY MOUTH  DAILY TO PREVENT HEARTBURN  AND INDIGESTION 90 tablet 3   Polyethyl Glycol-Propyl Glycol (SYSTANE) 0.4-0.3 % SOLN Place 2 drops into both eyes daily as needed (for dry eyes).     Semaglutide,0.25 or 0.5MG/DOS, 2 MG/3ML SOPN Inject 0.25 mg into Skin every 7 days for Diabetes  (  Dx: e10.29 ) (Patient not taking: Reported on 06/16/2022) 3 mL 0   tamsulosin (FLOMAX) 0.4 MG CAPS capsule Take 0.4 mg by mouth at bedtime.     Vitamin E 400 units TABS Take by mouth. Taking 800 IU a day     Zinc 30 MG TABS Take 30 mg by mouth daily.     No current facility-administered medications for this visit.     Review of Systems  Please see the history of present illness.    (+)*** (+)***  All other systems reviewed and are otherwise negative except as noted above.  Physical Exam    Wt Readings from Last 3 Encounters:  06/16/22 220 lb (99.8 kg)  05/13/22 220 lb 9.6 oz (100.1 kg)  04/30/22 221 lb (100.2 kg)   JK:DTOIZ were no vitals filed for this visit.,There is no height or weight on file to calculate BMI.  Constitutional:      Appearance: Healthy appearance. Not in distress.  Neck:     Vascular: JVD normal.  Pulmonary:     Effort: Pulmonary effort is normal.     Breath sounds: No wheezing. No rales. Diminished in the bases Cardiovascular:     Normal rate. Regular rhythm. Normal S1. Normal S2.      Murmurs: There is no murmur.  Edema:    Peripheral edema absent.  Abdominal:     Palpations: Abdomen is soft non tender. There is no hepatomegaly.  Skin:     General: Skin is warm and dry.  Neurological:     General: No focal deficit present.     Mental Status: Alert and oriented to person, place and time.     Cranial Nerves: Cranial nerves are intact.  EKG/LABS/Other Studies Reviewed    ECG personally reviewed by me today - ***  Risk Assessment/Calculations:   {Does this patient have ATRIAL FIBRILLATION?[phone]}        Lab Results  Component Value Date   WBC 5.8 06/16/2022   HGB 13.0 06/16/2022   HCT 38.4 (L) 06/16/2022   MCV 89.7 06/16/2022   PLT 184.0 06/16/2022   Lab Results  Component Value Date   CREATININE 1.24 06/16/2022   BUN 15 06/16/2022   NA 139 06/16/2022   K 4.1 06/16/2022   CL 104 06/16/2022   CO2 25 06/16/2022   Lab Results  Component Value Date   ALT 31 04/30/2022   AST 24 04/30/2022   ALKPHOS 55 01/11/2021   BILITOT 0.4 04/30/2022   Lab Results  Component Value Date   CHOL 138 04/30/2022   HDL 47 04/30/2022   LDLCALC 58 04/30/2022   LDLDIRECT 45.7 10/30/2013   TRIG 280 (H) 04/30/2022   CHOLHDL 2.9 04/30/2022    Lab Results  Component Value Date   HGBA1C 7.5 (H) 04/30/2022    Assessment &  Plan    1.  Coronary artery disease -s/p DES to mLAD and OM2 (06/2012),Repeat cath 07/2012 with patent stents -Today patient reports*** -Continue ASA, statin and BB   2.  Essential hypertension: -Patient's blood pressure today was***   3.  Hyperlipidemia: -Last LDL was 58 well-controlled and less than goal of 70 -Continue current statin therapy  4. IDDM: -Last hemoglobin A1c was not well controlled at 7.5      Disposition: Follow-up with Candee Furbish, MD or APP in *** months {Are you ordering a CV Procedure (e.g. stress test, cath, DCCV, TEE, etc)?   Press F2        :[id number]}   Medication Adjustments/Labs and Tests Ordered: Current medicines are reviewed at length with the patient today.  Concerns regarding medicines are outlined above.   Signed, Mable Fill, Marissa Nestle,  NP 06/30/2022, 4:26 PM [facility]  Note:  This document was prepared using Dragon voice recognition software and may include unintentional dictation errors.

## 2022-07-02 ENCOUNTER — Encounter: Payer: Self-pay | Admitting: Nurse Practitioner

## 2022-07-02 ENCOUNTER — Ambulatory Visit: Payer: Medicare Other | Attending: Nurse Practitioner | Admitting: Nurse Practitioner

## 2022-07-02 ENCOUNTER — Telehealth: Payer: Self-pay

## 2022-07-02 VITALS — BP 122/72 | HR 63 | Ht 70.0 in | Wt 223.0 lb

## 2022-07-02 DIAGNOSIS — Z9861 Coronary angioplasty status: Secondary | ICD-10-CM

## 2022-07-02 DIAGNOSIS — I2510 Atherosclerotic heart disease of native coronary artery without angina pectoris: Secondary | ICD-10-CM | POA: Diagnosis not present

## 2022-07-02 DIAGNOSIS — I10 Essential (primary) hypertension: Secondary | ICD-10-CM

## 2022-07-02 DIAGNOSIS — E785 Hyperlipidemia, unspecified: Secondary | ICD-10-CM

## 2022-07-02 DIAGNOSIS — Z01810 Encounter for preprocedural cardiovascular examination: Secondary | ICD-10-CM

## 2022-07-02 DIAGNOSIS — E1159 Type 2 diabetes mellitus with other circulatory complications: Secondary | ICD-10-CM | POA: Diagnosis not present

## 2022-07-02 DIAGNOSIS — E1169 Type 2 diabetes mellitus with other specified complication: Secondary | ICD-10-CM

## 2022-07-02 NOTE — Patient Outreach (Signed)
  Care Coordination   07/02/2022 Name: EARNEST MCGILLIS MRN: [id number] DOB: 06/14/1946   Care Coordination Outreach Attempts:  An unsuccessful telephone outreach was attempted today to offer the patient information about available care coordination services as a benefit of their health plan.   Follow Up Plan:  Additional outreach attempts will be made to offer the patient care coordination information and services.   Encounter Outcome:  Pt. Request to Call Back  Care Coordination Interventions Activated:  No   Care Coordination Interventions:  No, not indicated    Jone Baseman, RN, MSN [facility] Care Management Coordinator Direct Line [phone]

## 2022-07-02 NOTE — Patient Instructions (Signed)
Medication Instructions:  Your physician recommends that you continue on your current medications as directed. Please refer to the Current Medication list given to you today.  *If you need a refill on your cardiac medications before your next appointment, please call your pharmacy*   Lab Work: None If you have labs (blood work) drawn today and your tests are completely normal, you will receive your results only by: [place] (if you have MyChart) OR A paper copy in the mail If you have any lab test that is abnormal or we need to change your treatment, we will call you to review the results.   Follow-Up: At [facility], you and your health needs are our priority.  As part of our continuing mission to provide you with exceptional heart care, we have created designated Provider Care Teams.  These Care Teams include your primary Cardiologist (physician) and Advanced Practice Providers (APPs -  Physician Assistants and Nurse Practitioners) who all work together to provide you with the care you need, when you need it.  We recommend signing up for the patient portal called "MyChart".  Sign up information is provided on this After Visit Summary.  MyChart is used to connect with patients for Virtual Visits (Telemedicine).  Patients are able to view lab/test results, encounter notes, upcoming appointments, etc.  Non-urgent messages can be sent to your provider as well.   To learn more about what you can do with MyChart, go to [email or web address].    Your next appointment:   1 year(s)  The format for your next appointment:   In Person  Provider:   Candee Furbish, MD     Important Information About Sugar

## 2022-07-03 NOTE — Telephone Encounter (Signed)
LM-07/03/22-Called pt. And informed him CP will be in a mandatory meeting and will need to r/s upcoming visit for 10/24. Pt. Rescheduled to 12/5 at 1:00PM. Informed pt. I will call a few days prior to visit to confirm. Pt. Verbalized understanding and agreed.  Total time spent: 8 min

## 2022-07-03 NOTE — Telephone Encounter (Signed)
LM-07/03/22-Updated pts. LFD for upcoming visit w/ CP.   Total time spent: 7 min.

## 2022-07-06 ENCOUNTER — Other Ambulatory Visit: Payer: Self-pay | Admitting: Nurse Practitioner

## 2022-07-07 ENCOUNTER — Ambulatory Visit: Payer: Medicare Other | Admitting: Pharmacy Technician

## 2022-07-09 ENCOUNTER — Other Ambulatory Visit: Payer: Self-pay

## 2022-07-09 MED ORDER — ONETOUCH DELICA PLUS LANCET33G MISC: 3 refills | Status: AC

## 2022-07-20 ENCOUNTER — Ambulatory Visit: Payer: Medicare Other | Admitting: Nurse Practitioner

## 2022-07-20 ENCOUNTER — Telehealth: Payer: Self-pay

## 2022-07-20 DIAGNOSIS — M19072 Primary osteoarthritis, left ankle and foot: Secondary | ICD-10-CM | POA: Diagnosis not present

## 2022-07-20 DIAGNOSIS — E1142 Type 2 diabetes mellitus with diabetic polyneuropathy: Secondary | ICD-10-CM | POA: Diagnosis not present

## 2022-07-20 DIAGNOSIS — M19071 Primary osteoarthritis, right ankle and foot: Secondary | ICD-10-CM | POA: Diagnosis not present

## 2022-07-20 NOTE — Patient Outreach (Signed)
  Care Coordination   07/20/2022 Name: Michael Guzman MRN: [id number] DOB: 03-21-1946   Care Coordination Outreach Attempts:  A second unsuccessful outreach was attempted today to offer the patient with information about available care coordination services as a benefit of their health plan.     Follow Up Plan:  Additional outreach attempts will be made to offer the patient care coordination information and services.   Encounter Outcome:  No Answer  Care Coordination Interventions Activated:  No   Care Coordination Interventions:  No, not indicated    Jone Baseman, RN, MSN [facility] Care Management Care Management Coordinator Direct Line [phone]

## 2022-07-22 ENCOUNTER — Other Ambulatory Visit: Payer: Self-pay

## 2022-07-22 ENCOUNTER — Encounter (HOSPITAL_COMMUNITY): Payer: Self-pay | Admitting: Gastroenterology

## 2022-07-22 MED ORDER — PANTOPRAZOLE SODIUM 40 MG PO TBEC: DELAYED_RELEASE_TABLET | ORAL | 3 refills | Status: DC

## 2022-07-22 NOTE — Progress Notes (Signed)
Attempted to obtain medical history via telephone, unable to reach at this time. HIPAA compliant voicemail message left requesting return call to pre surgical testing department. 

## 2022-07-30 ENCOUNTER — Ambulatory Visit (HOSPITAL_COMMUNITY): Admission: RE | Admit: 2022-07-30 | Payer: Medicare Other | Source: Ambulatory Visit | Admitting: Gastroenterology

## 2022-07-30 ENCOUNTER — Telehealth: Payer: Self-pay | Admitting: Gastroenterology

## 2022-07-30 DIAGNOSIS — Z86010 Personal history of colonic polyps: Secondary | ICD-10-CM

## 2022-07-30 DIAGNOSIS — Z860101 Personal history of adenomatous and serrated colon polyps: Secondary | ICD-10-CM

## 2022-07-30 DIAGNOSIS — D123 Benign neoplasm of transverse colon: Secondary | ICD-10-CM

## 2022-07-30 DIAGNOSIS — R933 Abnormal findings on diagnostic imaging of other parts of digestive tract: Secondary | ICD-10-CM

## 2022-07-30 SURGERY — COLONOSCOPY WITH PROPOFOL
Anesthesia: Monitor Anesthesia Care

## 2022-07-30 NOTE — Telephone Encounter (Signed)
Inbound call from patient stating that he was scheduled for a procedure today at [facility] with Dr. Rush Landmark and the prep made him sick and he needed to reschedule. Patient is requesting a call back. Please advise.

## 2022-07-30 NOTE — Progress Notes (Signed)
MEDICARE ANNUAL WELLNESS VISIT AND FOLLOW UP   Assessment:    Encounter for Medicare annual wellness exam Yearly  Essential hypertension - continue medications, DASH diet, exercise and monitor at home. Call if greater than 130/80.  -     CBC with Differential/Platelet -     CMP/GFR  Type 2 diabetes mellitus with stage 2 chronic kidney disease, with long-term current use of insulin (HCC) Blood sugars are running low 90-100, Plan to decrease NPH to 40 units BID and monitor blood sugars Continue Metformin 1043m BID Monitor injection sites Education: Reviewed 'ABCs' of diabetes management (respective goals in parentheses):  A1C (<7.5), blood pressure (<130/80), and cholesterol (LDL <70) Eye Exam yearly and Dental Exam every 6 months. Dietary recommendations Physical Activity recommendations -     Hemoglobin A1c   ASCAD s/p PTCA (06/2012) Control blood pressure, cholesterol, glucose, increase exercise.  Followed by cardiology  Gastroesophageal reflux disease without esophagitis Continue PPI/H2 blocker, diet discussed  Diabetic peripheral neuropathy ([place] Check feet daily. Normal foot exam today Continue Gabapentin -     Hemoglobin A1c Increase fluids, avoid NSAIDS, control sugars/BP, will monitor  Thyroiditis Monitor, normal scan 2017, followed by Dr. GCruzita Lederer-     TSH  Mixed hyperlipidemia Continue medications: Atorvastatin 10 mg QD Discussed dietary and exercise modifications Low fat diet Lipid panel  Medication management Continued  Class 2 Severe Obesity with Serious Comorbidity(HCC) Long discussion about weight loss, diet, and exercise Recommended diet heavy in fruits and veggies and low in animal meats, cheeses, and dairy products, appropriate calorie intake Patient will work on walking daily 10-15 min, twice a day if can, with wife Discussed appropriate weight for height  Follow up at next visit  Atherosclerosis of aorta ([facility] Chest Xray 01/11/21 Control  blood pressure, cholesterol, glucose, increase exercise.   Fatty infiltration of liver Weight loss advised, avoid alcohol/tylenol, will monitor LFTs  Prostate cancer Followed by Dr. EJunious Silk treated by radiation, completed lupron  Former smoker (37 pack years, quit in 2012) Neg CXR 12/2020; never had low dose CT; discussed but declines at this time Agrees to CXR- ordered today  OSA Currently undergoing sleep study with Dr. DBrett Fairy Medication Management Continued  Flu Vaccine Need High dose Flu Vaccine given  Further disposition pending results if labs check today. Discussed med's effects and SE's.   Over 30 minutes of face to face interview, exam, counseling, chart review, and critical decision making was performed.    Future Appointments  Date Time Provider [place] 08/18/2022  1:00 PM [place]Carleene Mains [place] None  02/03/2023  9:00 AM WAlycia Rossetti NP [facility] None  05/06/2023  3:00 PM MUnk Pinto MD GA[facility]GAA[facility] None     Plan:   During the course of the visit the patient was educated and counseled about appropriate screening and preventive services including:   Pneumococcal vaccine  Influenza vaccine Prevnar 13 Td vaccine Screening electrocardiogram Colorectal cancer screening Diabetes screening Glaucoma screening Nutrition counseling    Subjective:  Michael REHMis a 76y.o. male who presents accompanied by his wife for Medicare Annual Wellness Visit and 3 month follow up for HTN, HLD, T2 diabetes, and vitamin D Def.   He has hx of ASCAD s/p PTCA (06/2012), last stress test 10/2018 was normal obtained for surgical clearance, followed by Dr. SMarlou Porch   Former smoker, 37.5 pack year history, quit in 2012; last CXR 12/2020 was normal  hx of prostate cancer treated by radiation in 2016-2017; he  is followed by Dr. Eskridge q26m last visit 03/2020  Some concern of ongoing depression; currently treated by wellbutrin 300 mg daily..Marland KitchenHis  wife died in [id number] has not been eating as well. Patient states he is not depressed. He reports otherwise his mood is fine.   BMI is Body mass index is 30.3 kg/m., he has been working on diet, eating less,  Wt Readings from Last 3 Encounters:  08/03/22 211 lb 3.2 oz (95.8 kg)  07/02/22 223 lb (101.2 kg)  06/16/22 220 lb (99.8 kg)   His blood pressure has been controlled at home, he is on toprol XL 1.25 mg daily only, day their BP is BP: 110/62  BP Readings from Last 3 Encounters:  08/03/22 110/62  07/02/22 122/72  06/16/22 118/82    He does workout. He denies chest pain, shortness of breath, dizziness.  He had sleep study 2 weeks ago but had an error so redoing sleep study this week. Dr. DBrett Fairyis following for sleep apnea.    He is on cholesterol medication Atorvastatin 20 mg daily and denies myalgias. His cholesterol is at goal. The cholesterol last visit was:   Lab Results  Component Value Date   CHOL 138 04/30/2022   HDL 47 04/30/2022   LDLCALC 58 04/30/2022   LDLDIRECT 45.7 10/30/2013   TRIG 280 (H) 04/30/2022   CHOLHDL 2.9 04/30/2022   He has been working on diet and exercise for Diabetes with diabetic chronic kidney disease, with other circulatory complications and with diabetic polyneuropathy , he is on bASA, he is not on ACE/ARB due to low BPS, sugars in the AM are 90-120. In the afternoon his BS are running 105. He has not been as hungry the  past couple of months  He eats good breakfast and dinner, will skip lunch. He wakes up between 3-5 am and starts breakfast at 6 pm, then dinner is 3-4 PM Currently taking Humulin 70/30 50 units BID Also on metformin 2000 mg daily. He denies hypoglycemia , polydipsia and polyuria. He has gabapentin prescribed for intermittent LE parasthesias, takes 800 mg QID PRN, none in the last month.  Last A1C was:  Lab Results  Component Value Date   HGBA1C 7.5 (H) 04/30/2022   Lab Results  Component Value Date   EGFR 64 04/30/2022     He has a history of thyroiditis, TSHs have been monitored closely and remained in normal range.   Lab Results  Component Value Date   TSH 0.93 04/30/2022  . Patient is on Vitamin D supplement.  Currently on 10000 units daily Lab Results  Component Value Date   VD25OH 53 04/30/2022       Medication Review: Current Outpatient Medications on File Prior to Visit  Medication Sig Dispense Refill   Ascorbic Acid (VITAMIN C) 100 MG tablet Take 100 mg by mouth daily.     aspirin EC 81 MG tablet Take 1 tablet (81 mg total) by mouth daily. 90 tablet 3   atorvastatin (LIPITOR) 10 MG tablet TAKE 1 TABLET BY MOUTH  DAILY 90 tablet 3   Blood Glucose Monitoring Suppl (ONE TOUCH ULTRA 2) w/Device KIT Use as directed 1 kit 0   buPROPion (WELLBUTRIN XL) 300 MG 24 hr tablet Take  1 tablet  Daily for Mood , Focus & Concentration                              /  TAKE                        BY                            MOUTH 90 tablet 3   Cholecalciferol (VITAMIN D3) 125 MCG (5000 UT) CAPS Take 5,000 Units by mouth daily.     docusate sodium (COLACE) 100 MG capsule Take 1 capsule (100 mg total) by mouth 2 (two) times daily. (Patient taking differently: Take 100 mg by mouth daily as needed for mild constipation or moderate constipation.) 30 capsule 0   furosemide (LASIX) 40 MG tablet Take 1 tablet 2 x /day as needed  for Fluid Retention (Patient taking differently: Take 20 mg by mouth 2 (two) times daily as needed for fluid or edema (Fluid Retention).) 180 tablet 3   gabapentin (NEURONTIN) 800 MG tablet TAKE 1 TABLET BY MOUTH 3 TO 4  TIMES DAILY FOR PAIN, HOT  FLASHES AND SWEATS (Patient taking differently: Take 1,600 mg by mouth 2 (two) times daily.  FOR PAIN, HOT FLASHES AND SWEATS) 360 tablet 3   glucose blood (ONETOUCH ULTRA) test strip CHECK BLOOD SUGAR 3 TIMES  DAILY 300 strip 3   glycopyrrolate (ROBINUL) 2 MG tablet Take  1 tablet  3 x /day  for Excessive Sweating (Patient taking  differently: Take 2 mg by mouth 2 (two) times daily.  for Excessive Sweating) 90 tablet 11   HUMULIN 70/30 (70-30) 100 UNIT/ML injection INJECT SUBCUTANEOUSLY 50 UNITS  TWICE DAILY 100 mL 2   Insulin Syringes, Disposable, U-100 1 ML MISC 50 Units by Does not apply route 2 (two) times daily. inject 50 units into skin 2 x /day 200 each 3   Lancets (ONETOUCH DELICA PLUS [id number]) MISC CHECK BLOOD SUGAR 3 TIMES  DAILY 300 each 3   latanoprost (XALATAN) 0.005 % ophthalmic solution Place 1 drop into both eyes at bedtime.     Magnesium 250 MG TABS Take 250 mg by mouth daily.      metFORMIN (GLUCOPHAGE-XR) 500 MG 24 hr tablet TAKE 2 TABLETS BY MOUTH TWICE  DAILY WITH MEALS FOR DIABETES 360 tablet 3   metoprolol succinate (TOPROL-XL) 25 MG 24 hr tablet Take 0.5 tablets (12.5 mg total) by mouth daily. Please call [phone] to schedule an appointment with Dr. Candee Furbish for future refills. Thank you. 1st attempt. 45 tablet 0   montelukast (SINGULAIR) 10 MG tablet TAKE 1 TABLET BY MOUTH DAILY FOR ALLERGIES 90 tablet 3   Multiple Vitamin (MULTIVITAMIN WITH MINERALS) TABS tablet Take 1 tablet by mouth daily.     nitroGLYCERIN (NITROSTAT) 0.4 MG SL tablet PLACE 1 TABLET UNDER THE TONGUE EVERY 5 MINUTES AS NEEDED FOR CHEST PAIN 25 tablet 6   ondansetron (ZOFRAN) 8 MG tablet Take 1 tablet (8 mg total) by mouth every 8 (eight) hours as needed for nausea or vomiting. 6 tablet 0   pantoprazole (PROTONIX) 40 MG tablet TAKE 1 TABLET BY MOUTH  DAILY TO PREVENT HEARTBURN  AND INDIGESTION 90 tablet 3   Polyethyl Glycol-Propyl Glycol (SYSTANE) 0.4-0.3 % SOLN Place 2 drops into both eyes daily as needed (for dry eyes).     tamsulosin (FLOMAX) 0.4 MG CAPS capsule Take 0.4 mg by mouth at bedtime.     Vitamin E 400 units TABS Take 800 Units by mouth daily.     Zinc 30 MG TABS Take 30 mg by  mouth daily.     Na Sulfate-K Sulfate-Mg Sulf (SUPREP BOWEL PREP KIT) 17.5-3.13-1.6 GM/177ML SOLN Take 1 kit by mouth as directed. For  colonoscopy prep (Patient not taking: Reported on 08/03/2022) 354 mL 0   No current facility-administered medications on file prior to visit.    Allergies: No Known Allergies  Current Problems (verified) has Hypertension (1991); GERD; ASCAD s/p PTCA (06/2012); Hyperlipidemia associated with type 2 diabetes mellitus ([place]); Vitamin D deficiency; Medication management; Diabetic peripheral neuropathy ([place]); Malignant neoplasm of prostate ([place]); Thyroiditis; Obesity (BMI 30.0-34.9); Type 2 diabetes mellitus with vascular disease ([place]); Aortic atherosclerosis ([place]) by Abd CT scan 2015; Microalbuminuric diabetic nephropathy ([place]); Hypertriglyceridemia; Fatty infiltration of liver; Former smoker; Primary osteoarthritis of hip; AMS (altered mental status); Hypoglycemia due to insulin; Acute encephalopathy; Hypothermia; Diabetes mellitus with coincident hypertension ([place]); Diaphoresis; Inadequate sleep hygiene; Hypersomnia with sleep apnea; Excessive daytime sleepiness; Excessive postexertional fatigue; Adenomatous polyp of transverse colon; Hx of adenomatous colonic polyps; Post colonoscopy pain nausea and vomiting; and Abnormal colonoscopy on their problem list.  Screening Tests Immunization History  Administered Date(s) Administered   Influenza Split 06/26/2012   Influenza, High Dose Seasonal PF 06/20/2015, 06/23/2016, 06/14/2017, 06/14/2019, 07/01/2020, 07/21/2021   Influenza-Unspecified 06/14/2013, 06/25/2014, 06/03/2018   PFIZER(Purple Top)SARS-COV-2 Vaccination 10/28/2019, 11/22/2019, 06/26/2020   Pneumococcal Conjugate-13 04/23/2016   Pneumococcal Polysaccharide-23 06/14/2017   Pneumococcal-Unspecified 06/16/2010   Td 09/17/2003   Tdap 11/26/2013   Zoster, Live 07/12/2012    Health Maintenance  Topic Date Due   INFLUENZA VACCINE  04/14/2022   Diabetic kidney evaluation - Urine ACR  04/16/2022   COVID-19 Vaccine (4 - Pfizer risk series) 08/19/2022 (Originally 08/21/2020)   Zoster Vaccines-  Shingrix (1 of 2) 11/03/2022 (Originally 06/02/1965)   HEMOGLOBIN A1C  10/31/2022   FOOT EXAM  05/01/2023   Diabetic kidney evaluation - GFR measurement  06/17/2023   OPHTHALMOLOGY EXAM  06/17/2023   Medicare Annual Wellness (AWV)  08/04/2023   COLONOSCOPY (Pts 45-68yr Insurance coverage will need to be confirmed)  04/08/2032   Pneumonia Vaccine 76 Years old  Completed   Hepatitis C Screening  Completed   HPV VACCINES  Aged Out   Lung Cancer Screening  Discontinued    Names of Other Physician/Practitioners you currently use: 1. [facility] here for primary care 2. Dr. SGershon Crane eye doctor, last visit 06/17/22, mild glaucoma uses Latanoprost in both eyes [facility]  Patient Care Team: MUnk Pinto MD as PCP - General (Internal Medicine) SJerline Pain MD as PCP - Cardiology (Cardiology) [facility]Carleene [facility] (Pharmacist)  Surgical: He  has a past surgical history that includes Appendectomy; Cardiac surgery (~2017); left heart catheterization with coronary angiogram (N/A, 06/27/2012); left heart catheterization with coronary angiogram (N/A, 07/25/2012); Colonoscopy (02/12/2010); Radiology with anesthesia (N/A, 12/03/2015); Prostate biopsy; and Total hip arthroplasty (Right, 04/28/2019). Family His family history includes Diabetes in his sister. Social history  He reports that he quit smoking about 11 years ago. His smoking use included cigarettes. He started smoking about 63 years ago. He has a 37.50 pack-year smoking history. He has never used smokeless tobacco. He reports that he does not currently use alcohol. He reports that he does not use drugs.  MEDICARE WELLNESS OBJECTIVES: Physical activity: Current Exercise Habits: Home exercise routine, Type of exercise: walking, Time (Minutes): 30, Frequency (Times/Week): 7, Weekly Exercise (Minutes/Week): 210, Intensity: Mild, Exercise limited by:  None identified Cardiac risk factors: Cardiac Risk Factors include: advanced  age (>31mn, >>35women);diabetes mellitus;dyslipidemia;hypertension;male gender;obesity (BMI >30kg/m2);smoking/ tobacco exposure Depression/mood screen:      08/03/2022    9:43 AM  Depression screen PHQ 2/9  Decreased Interest 0  Down, Depressed, Hopeless 0  PHQ - 2 Score 0    ADLs:     08/03/2022    9:49 AM  In your present state of health, do you have any difficulty performing the following activities:  Hearing? 0  Vision? 0  Difficulty concentrating or making decisions? 0  Walking or climbing stairs? 0  Dressing or bathing? 0  Doing errands, shopping? 0     Cognitive Testing  Alert? Yes  Normal Appearance?Yes  Oriented to person? Yes  Place? Yes   Time? Yes  Recall of three objects?  Yes  Can perform simple calculations? Yes  Displays appropriate judgment?Yes  Can read the correct time from a watch face?Yes  EOL planning: Does Patient Have a Medical Advance Directive?: Yes Type of Advance Directive: Healthcare Power of Attorney, Living will Does patient want to make changes to medical advance directive?: No - Patient declined [place] of [place]?: No - copy requested  Review of Systems  Constitutional:  Negative for chills, fever and weight loss.  HENT:  Positive for tinnitus. Negative for congestion and hearing loss.   Eyes:  Negative for blurred vision and double vision.  Respiratory:  Negative for cough and shortness of breath.   Cardiovascular:  Negative for chest pain, palpitations, orthopnea and leg swelling.  Gastrointestinal:  Negative for abdominal pain, constipation, diarrhea, heartburn, nausea and vomiting.       Decreased appetite  Genitourinary:  Negative for dysuria.  Musculoskeletal:  Negative for falls, joint pain and myalgias.  Skin:  Negative for rash.  Neurological:  Positive for tingling (burning pain in feet). Negative for dizziness, tremors,  loss of consciousness and headaches.  Endo/Heme/Allergies:  Does not bruise/bleed easily.  Psychiatric/Behavioral:  Negative for depression, memory loss and suicidal ideas.      Objective:   Today's Vitals   08/03/22 0911  BP: 110/62  Pulse: 67  Temp: (!) 97.3 F (36.3 C)  SpO2: 97%  Weight: 211 lb 3.2 oz (95.8 kg)  Height: _0  (1.778 m)    Body mass index is 30.3 kg/m.  General appearance: alert, no distress, WD/WN, male HEENT: normocephalic, sclerae anicteric, TMs pearly, nares patent, no discharge or erythema, pharynx normal Oral cavity: MMM, no lesions Neck: supple, no lymphadenopathy, no thyromegaly, no masses Heart: RRR, normal S1, S2, no murmurs Lungs: CTA bilaterally, no wheezes, rhonchi, or rales Abdomen: +bs, soft, non tender, non distended, no masses, no hepatomegaly, no splenomegaly Musculoskeletal: nontender, no swelling, no obvious deformity; slow sitting to standing, slow steady gait  Extremities: no edema, no cyanosis, no clubbing Pulses: 2+ symmetric, upper and lower extremities, normal cap refill Neurological: alert, oriented x 3, CN2-12 intact, strength normal upper extremities and lower extremities, sensation was normal bilateral feet to monofilament, DTRs 2+ throughout, no cerebellar signs Psychiatric: depressed affect, behavior normal  Medicare Attestation I have personally reviewed: The patient's medical and social history Their use of alcohol, tobacco or illicit drugs Their current medications and supplements The patient's functional ability including ADLs,fall risks, home safety risks, cognitive, and hearing and visual impairment Diet and physical activities Evidence for depression or mood disorders  The patient's weight, height, BMI, and visual acuity have been recorded in the chart.  I have made referrals, counseling, and provided education to the patient  based on review of the above and I have provided the patient with a written personalized  care plan for preventive services.      Magda Bernheim ANP-C  [facility]  08/03/2022

## 2022-08-03 ENCOUNTER — Encounter: Payer: Self-pay | Admitting: Nurse Practitioner

## 2022-08-03 ENCOUNTER — Ambulatory Visit (INDEPENDENT_AMBULATORY_CARE_PROVIDER_SITE_OTHER): Payer: Medicare Other | Admitting: Nurse Practitioner

## 2022-08-03 VITALS — BP 110/62 | HR 67 | Temp 97.3°F | Ht 70.0 in | Wt 211.2 lb

## 2022-08-03 DIAGNOSIS — Z79899 Other long term (current) drug therapy: Secondary | ICD-10-CM | POA: Diagnosis not present

## 2022-08-03 DIAGNOSIS — R6889 Other general symptoms and signs: Secondary | ICD-10-CM | POA: Diagnosis not present

## 2022-08-03 DIAGNOSIS — E1159 Type 2 diabetes mellitus with other circulatory complications: Secondary | ICD-10-CM

## 2022-08-03 DIAGNOSIS — G4733 Obstructive sleep apnea (adult) (pediatric): Secondary | ICD-10-CM

## 2022-08-03 DIAGNOSIS — Z0001 Encounter for general adult medical examination with abnormal findings: Secondary | ICD-10-CM | POA: Diagnosis not present

## 2022-08-03 DIAGNOSIS — Z9861 Coronary angioplasty status: Secondary | ICD-10-CM

## 2022-08-03 DIAGNOSIS — E069 Thyroiditis, unspecified: Secondary | ICD-10-CM | POA: Diagnosis not present

## 2022-08-03 DIAGNOSIS — E785 Hyperlipidemia, unspecified: Secondary | ICD-10-CM

## 2022-08-03 DIAGNOSIS — K760 Fatty (change of) liver, not elsewhere classified: Secondary | ICD-10-CM

## 2022-08-03 DIAGNOSIS — E66812 Obesity, class 2: Secondary | ICD-10-CM

## 2022-08-03 DIAGNOSIS — E1142 Type 2 diabetes mellitus with diabetic polyneuropathy: Secondary | ICD-10-CM | POA: Diagnosis not present

## 2022-08-03 DIAGNOSIS — E1122 Type 2 diabetes mellitus with diabetic chronic kidney disease: Secondary | ICD-10-CM

## 2022-08-03 DIAGNOSIS — Z87891 Personal history of nicotine dependence: Secondary | ICD-10-CM

## 2022-08-03 DIAGNOSIS — E1169 Type 2 diabetes mellitus with other specified complication: Secondary | ICD-10-CM | POA: Diagnosis not present

## 2022-08-03 DIAGNOSIS — I2510 Atherosclerotic heart disease of native coronary artery without angina pectoris: Secondary | ICD-10-CM

## 2022-08-03 DIAGNOSIS — E119 Type 2 diabetes mellitus without complications: Secondary | ICD-10-CM

## 2022-08-03 DIAGNOSIS — I10 Essential (primary) hypertension: Secondary | ICD-10-CM

## 2022-08-03 DIAGNOSIS — I700 Atherosclerosis of aorta: Secondary | ICD-10-CM | POA: Diagnosis not present

## 2022-08-03 DIAGNOSIS — Z23 Encounter for immunization: Secondary | ICD-10-CM

## 2022-08-03 DIAGNOSIS — C61 Malignant neoplasm of prostate: Secondary | ICD-10-CM

## 2022-08-03 DIAGNOSIS — N182 Chronic kidney disease, stage 2 (mild): Secondary | ICD-10-CM

## 2022-08-03 DIAGNOSIS — Z0000 Encounter for general adult medical examination without abnormal findings: Secondary | ICD-10-CM

## 2022-08-03 DIAGNOSIS — Z794 Long term (current) use of insulin: Secondary | ICD-10-CM

## 2022-08-03 DIAGNOSIS — E559 Vitamin D deficiency, unspecified: Secondary | ICD-10-CM

## 2022-08-03 NOTE — Patient Instructions (Signed)
Eat 3 meals daily Decrease insulin to 40 units twice a day Keep blood sugar log and bring with you in 2 weeks to reevaluate  Chest xray is ordered at [facility] [place] In M-F 8:30-3:45  Diabetes Mellitus and Nutrition, Adult When you have diabetes, or diabetes mellitus, it is very important to have healthy eating habits because your blood sugar (glucose) levels are greatly affected by what you eat and drink. Eating healthy foods in the right amounts, at about the same times every day, can help you: Manage your blood glucose. Lower your risk of heart disease. Improve your blood pressure. Reach or maintain a healthy weight. What can affect my meal plan? Every person with diabetes is different, and each person has different needs for a meal plan. Your health care provider may recommend that you work with a dietitian to make a meal plan that is best for you. Your meal plan may vary depending on factors such as: The calories you need. The medicines you take. Your weight. Your blood glucose, blood pressure, and cholesterol levels. Your activity level. Other health conditions you have, such as heart or kidney disease. How do carbohydrates affect me? Carbohydrates, also called carbs, affect your blood glucose level more than any other type of food. Eating carbs raises the amount of glucose in your blood. It is important to know how many carbs you can safely have in each meal. This is different for every person. Your dietitian can help you calculate how many carbs you should have at each meal and for each snack. How does alcohol affect me? Alcohol can cause a decrease in blood glucose (hypoglycemia), especially if you use insulin or take certain diabetes medicines by mouth. Hypoglycemia can be a life-threatening condition. Symptoms of hypoglycemia, such as sleepiness, dizziness, and confusion, are similar to symptoms of having too much alcohol. Do not drink alcohol  if: Your health care provider tells you not to drink. You are pregnant, may be pregnant, or are planning to become pregnant. If you drink alcohol: Limit how much you have to: 0-1 drink a day for women. 0-2 drinks a day for men. Know how much alcohol is in your drink. In the [place] one drink equals one 12 oz bottle of beer (355 mL), one 5 oz glass of wine (148 mL), or one 1 oz glass of hard liquor (44 mL). Keep yourself hydrated with water, diet soda, or unsweetened iced tea. Keep in mind that regular soda, juice, and other mixers may contain a lot of sugar and must be counted as carbs. What are tips for following this plan?  Reading food labels Start by checking the serving size on the Nutrition Facts label of packaged foods and drinks. The number of calories and the amount of carbs, fats, and other nutrients listed on the label are based on one serving of the item. Many items contain more than one serving per package. Check the total grams (g) of carbs in one serving. Check the number of grams of saturated fats and trans fats in one serving. Choose foods that have a low amount or none of these fats. Check the number of milligrams (mg) of salt (sodium) in one serving. Most people should limit total sodium intake to less than 2,300 mg per day. Always check the nutrition information of foods labeled as "low-fat" or "nonfat." These foods may be higher in added sugar or refined carbs and should be avoided. Talk to your dietitian to identify  your daily goals for nutrients listed on the label. Shopping Avoid buying canned, pre-made, or processed foods. These foods tend to be high in fat, sodium, and added sugar. Shop around the outside edge of the grocery store. This is where you will most often find fresh fruits and vegetables, bulk grains, fresh meats, and fresh dairy products. Cooking Use low-heat cooking methods, such as baking, instead of high-heat cooking methods, such as deep frying. Cook  using healthy oils, such as olive, canola, or sunflower oil. Avoid cooking with butter, cream, or high-fat meats. Meal planning Eat meals and snacks regularly, preferably at the same times every day. Avoid going long periods of time without eating. Eat foods that are high in fiber, such as fresh fruits, vegetables, beans, and whole grains. Eat 4-6 oz (112-168 g) of lean protein each day, such as lean meat, chicken, fish, eggs, or tofu. One ounce (oz) (28 g) of lean protein is equal to: 1 oz (28 g) of meat, chicken, or fish. 1 egg.  cup (62 g) of tofu. Eat some foods each day that contain healthy fats, such as avocado, nuts, seeds, and fish. What foods should I eat? Fruits Berries. Apples. Oranges. Peaches. Apricots. Plums. Grapes. Mangoes. Papayas. Pomegranates. Kiwi. Cherries. Vegetables Leafy greens, including lettuce, spinach, kale, chard, collard greens, mustard greens, and cabbage. Beets. Cauliflower. Broccoli. Carrots. Green beans. Tomatoes. Peppers. Onions. Cucumbers. Brussels sprouts. Grains Whole grains, such as whole-wheat or whole-grain bread, crackers, tortillas, cereal, and pasta. Unsweetened oatmeal. Quinoa. Brown or wild rice. Meats and other proteins Seafood. Poultry without skin. Lean cuts of poultry and beef. Tofu. Nuts. Seeds. Dairy Low-fat or fat-free dairy products such as milk, yogurt, and cheese. The items listed above may not be a complete list of foods and beverages you can eat and drink. Contact a dietitian for more information. What foods should I avoid? Fruits Fruits canned with syrup. Vegetables Canned vegetables. Frozen vegetables with butter or cream sauce. Grains Refined white flour and flour products such as bread, pasta, snack foods, and cereals. Avoid all processed foods. Meats and other proteins Fatty cuts of meat. Poultry with skin. Breaded or fried meats. Processed meat. Avoid saturated fats. Dairy Full-fat yogurt, cheese, or  milk. Beverages Sweetened drinks, such as soda or iced tea. The items listed above may not be a complete list of foods and beverages you should avoid. Contact a dietitian for more information. Questions to ask a health care provider Do I need to meet with a certified diabetes care and education specialist? Do I need to meet with a dietitian? What number can I call if I have questions? When are the best times to check my blood glucose? Where to find more information: American Diabetes Association: diabetes.org Academy of Nutrition and Dietetics: eatright.[employer] of Diabetes and Digestive and Kidney Diseases: [employer].is Association of Diabetes Care & Education Specialists: diabeteseducator.org Summary It is important to have healthy eating habits because your blood sugar (glucose) levels are greatly affected by what you eat and drink. It is important to use alcohol carefully. A healthy meal plan will help you manage your blood glucose and lower your risk of heart disease. Your health care provider may recommend that you work with a dietitian to make a meal plan that is best for you. This information is not intended to replace advice given to you by your health care provider. Make sure you discuss any questions you have with your health care provider. Document Revised: 04/03/2020 Document Reviewed: 04/03/2020 Elsevier  Patient Education  2023 Elsevier Inc.  

## 2022-08-04 LAB — CBC WITH DIFFERENTIAL/PLATELET
Absolute Monocytes: 415 cells/uL (ref 200–950)
Basophils Absolute: 60 cells/uL (ref 0–200)
Basophils Relative: 1.2 %
Eosinophils Absolute: 485 cells/uL (ref 15–500)
Eosinophils Relative: 9.7 %
HCT: 42.2 % (ref 38.5–50.0)
Hemoglobin: 14.3 g/dL (ref 13.2–17.1)
Lymphs Abs: 1840 cells/uL (ref 850–3900)
MCH: 30.2 pg (ref 27.0–33.0)
MCHC: 33.9 g/dL (ref 32.0–36.0)
MCV: 89.2 fL (ref 80.0–100.0)
MPV: 10.9 fL (ref 7.5–12.5)
Monocytes Relative: 8.3 %
Neutro Abs: 2200 cells/uL (ref 1500–7800)
Neutrophils Relative %: 44 %
Platelets: 215 10*3/uL (ref 140–400)
RBC: 4.73 10*6/uL (ref 4.20–5.80)
RDW: 12.3 % (ref 11.0–15.0)
Total Lymphocyte: 36.8 %
WBC: 5 10*3/uL (ref 3.8–10.8)

## 2022-08-04 LAB — MAGNESIUM: Magnesium: 2 mg/dL (ref 1.5–2.5)

## 2022-08-04 LAB — LIPID PANEL
Cholesterol: 114 mg/dL (ref <200)
HDL: 43 mg/dL (ref >=40)
LDL Cholesterol (Calc): 42 mg/dL (calc)
Non-HDL Cholesterol (Calc): 71 mg/dL (calc) (ref <130)
Total CHOL/HDL Ratio: 2.7 (calc) (ref <5.0)
Triglycerides: 217 mg/dL — ABNORMAL HIGH (ref <150)

## 2022-08-04 LAB — COMPLETE METABOLIC PANEL WITH GFR
AG Ratio: 1.8 (calc) (ref 1.0–2.5)
ALT: 39 U/L (ref 9–46)
AST: 39 U/L — ABNORMAL HIGH (ref 10–35)
Albumin: 5.1 g/dL (ref 3.6–5.1)
Alkaline phosphatase (APISO): 60 U/L (ref 35–144)
BUN: 16 mg/dL (ref 7–25)
CO2: 28 mmol/L (ref 20–32)
Calcium: 10.5 mg/dL — ABNORMAL HIGH (ref 8.6–10.3)
Chloride: 98 mmol/L (ref 98–110)
Creat: 1.28 mg/dL (ref 0.70–1.28)
Globulin: 2.8 g/dL (calc) (ref 1.9–3.7)
Glucose, Bld: 140 mg/dL — ABNORMAL HIGH (ref 65–99)
Potassium: 4.4 mmol/L (ref 3.5–5.3)
Sodium: 134 mmol/L — ABNORMAL LOW (ref 135–146)
Total Bilirubin: 0.4 mg/dL (ref 0.2–1.2)
Total Protein: 7.9 g/dL (ref 6.1–8.1)
eGFR: 58 mL/min/{1.73_m2} — ABNORMAL LOW (ref >=60)

## 2022-08-04 LAB — HEMOGLOBIN A1C
Hgb A1c MFr Bld: 7 % of total Hgb — ABNORMAL HIGH (ref <5.7)
Mean Plasma Glucose: 154 mg/dL
eAG (mmol/L): 8.5 mmol/L

## 2022-08-04 LAB — TSH: TSH: 1.09 mIU/L (ref 0.40–4.50)

## 2022-08-04 MED ORDER — NA SULFATE-K SULFATE-MG SULF 17.5-3.13-1.6 GM/177ML PO SOLN: 1.0000 | Freq: Once | ORAL | 0 refills | Status: AC

## 2022-08-04 NOTE — Telephone Encounter (Signed)
The pt has been scheduled for 09/21/22 at 1215 pm at [facility] with GM   Referral has been sent and new instructions mailed   The pt has been advised

## 2022-08-11 ENCOUNTER — Telehealth: Payer: Self-pay

## 2022-08-11 NOTE — Telephone Encounter (Signed)
LM-08/11/22-Called pt. And informed him CP will need to move his appointment on 12/5 to either 9 or 10:30AM. Pt. Was not happy, and expressed concern because his appointments keep getting r/s and he did not understand what the point of the visit w/ CP was. Apologized to patient for the inconvenience and informed him that CP will not be available at 1:00PM d/t meetings. Informed pt. That the CP helps Dr. Melford Aase coordinate care for pts. Chronic conditions and does medication review as well as creating a detailed care plan for patient. Pt. Agreed to r/s to 12/5 at 10:30AM. Pt. Requested I call on 12/4 to remind him of appointment to which I agreed to.  Total time spent: 12 min.

## 2022-08-11 NOTE — Telephone Encounter (Signed)
LM-08/11/22-Chart Prep started, Reviewing OV, Consults, Hospital visits, Labs and medication changes. Chart prep for initial CP visit on 12/5.  Total time spent: 37 min.

## 2022-08-11 NOTE — Patient Outreach (Signed)
  Care Coordination   08/11/2022 Name: Michael Guzman MRN: [id number] DOB: January 04, 1946   Care Coordination Outreach Attempts:  A third unsuccessful outreach was attempted today to offer the patient with information about available care coordination services as a benefit of their health plan.   Follow Up Plan:  No further outreach attempts will be made at this time. We have been unable to contact the patient to offer or enroll patient in care coordination services  Encounter Outcome:  No Answer   Care Coordination Interventions:  No, not indicated    Jone Baseman, RN, MSN [address] [facility] Care Management Coordinator Direct Line [phone]

## 2022-08-12 NOTE — Telephone Encounter (Signed)
LM-08/12/22-Care plan created for upcoming visit on 12/5 w/ CP.   Total time spent: 4 min

## 2022-08-18 ENCOUNTER — Ambulatory Visit: Payer: Medicare Other | Admitting: Pharmacy Technician

## 2022-08-20 ENCOUNTER — Other Ambulatory Visit (HOSPITAL_BASED_OUTPATIENT_CLINIC_OR_DEPARTMENT_OTHER): Payer: Self-pay | Admitting: Cardiology

## 2022-08-20 NOTE — Telephone Encounter (Signed)
Rx refill sent to pharmacy. 

## 2022-08-25 ENCOUNTER — Encounter: Payer: Self-pay | Admitting: *Deleted

## 2022-08-25 ENCOUNTER — Telehealth: Payer: Self-pay

## 2022-08-25 NOTE — Telephone Encounter (Signed)
LM-08/25/22-Called pt. To r/s CP initial visit. Unable to reach pt. VM box was full, unable to LVM.  Total time spent: 2 min.

## 2022-08-25 NOTE — Progress Notes (Signed)
[facility] Quality Team Note  Name: Michael Guzman Date of Birth: 1945/10/13 MRN: [id number] Date: 08/25/2022  [facility] Quality Team has reviewed this patient's chart, please see recommendations below:  [facility] Quality Other; KED: Kidney Health Evaluation Gap- Patient needs Urine Albumin Creatinine Ratio Test completed for gap closure. EGFR has already been completed, Patient has upcoming appointment with [facility] on 08/31/2022.

## 2022-08-26 NOTE — Telephone Encounter (Signed)
LM-08/26/22-Called pt. To r/s initial CP visit. Unable to reach pt. VM box is full, unable to LVM. Will try calling at another time.  Total time spent: 2 min.

## 2022-08-27 NOTE — Progress Notes (Signed)
FOLLOW UP   Assessment:     Type 2 diabetes mellitus with stage 2 chronic kidney disease, with long-term current use of insulin (HCC) Blood sugars are running low 90-130,Continue NPH to 40 units BID and monitor blood sugars Continue Metformin 1056m BID Monitor injection sites Education: Reviewed 'ABCs' of diabetes management (respective goals in parentheses):  A1C (<7.5), blood pressure (<130/80), and cholesterol (LDL <70) Eye Exam yearly and Dental Exam every 6 months. Dietary recommendations Physical Activity recommendations   CKD stage 2 due to Ty[e 2 Diabetes mellitus(HCC) Push fluids, avoid NSAIDS, control blood sugars - Microalbumin/Creatinine urine ratio - UA routine with reflex microscopic  ASCAD s/p PTCA (06/2012) Control blood pressure, cholesterol, glucose, increase exercise.  Followed by cardiology Nitroglycerine as needed  Medication management Continued  Class 2 Severe Obesity with Serious Comorbidity(HCC) Long discussion about weight loss, diet, and exercise Recommended diet heavy in fruits and veggies and low in animal meats, cheeses, and dairy products, appropriate calorie intake Patient will work on walking daily 10-15 min, twice a day if can, with Guzman Discussed appropriate weight for height  Follow up at next visit  Atherosclerosis of aorta ([facility] Chest Xray 01/11/21 Control blood pressure, cholesterol, glucose, increase exercise.        Further disposition pending results if labs check today. Discussed med's effects and SE's.   Over 30 minutes of face to face interview, exam, counseling, chart review, and critical decision making was performed.    Future Appointments  Date Time Provider DFairchilds 02/03/2023  9:00 AM WAlycia Rossetti NP [facility] None  05/06/2023  3:00 PM MUnk Pinto MD [facility] None        Subjective:  Michael DOUGALis a 76y.o. male who presents accompanied by Michael Guzman for Medicare Annual  Wellness Visit and 3 month follow up for HTN, HLD, T2 diabetes, and vitamin D Def.   He has hx of ASCAD s/p PTCA (06/2012), last stress test 10/2018 was normal obtained for surgical clearance, followed by Dr. SMarlou Porch   Former smoker, 37.5 pack year history, quit in 2012; last CXR 12/2020 was normal  hx of prostate cancer treated by radiation in 2016-2017; he is followed by Dr. Eskridge [id number] visit 03/2020  Wellbutrin 300 mg daily, denies feelings of depression.   BMI is Body mass index is 30.62 kg/m., he has been working on diet, eating less, He has not been doing much exercise.  He has not been doing as well with Michael diet, has been having more sweets due to the holidays Wt Readings from Last 3 Encounters:  08/31/22 213 lb 6.4 oz (96.8 kg)  08/03/22 211 lb 3.2 oz (95.8 kg)  07/02/22 223 lb (101.2 kg)   Michael blood pressure has been controlled at home, he is on toprol XL 1.25 mg daily only, day their BP is BP: 100/68  BP Readings from Last 3 Encounters:  08/31/22 100/68  08/03/22 110/62  07/02/22 122/72  He does workout. He denies chest pain, shortness of breath, dizziness.   He has been working on diet and exercise for Diabetes with diabetic chronic kidney disease, with other circulatory complications and with diabetic polyneuropathy , he is on bASA, he is not on ACE/ARB due to low BPS, sugars in the AM are 90-130. He forgot Michael BS logs today. He wakes up between 3-5 am and starts breakfast at 6 pm, then dinner is 3-4 PM Currently taking Humulin 70/30 40 units BID- does not take insulin if 119  or less, will only take metformin Also on metformin 2000 mg daily. He denies hypoglycemia , polydipsia and polyuria. He has gabapentin prescribed for intermittent LE parasthesias, takes 800 mg QID PRN, none in the last month.  Last A1C was:  Lab Results  Component Value Date   HGBA1C 7.0 (H) 08/03/2022   Lab Results  Component Value Date   EGFR 58 (L) 08/03/2022   Medication Review: Current  Outpatient Medications on File Prior to Visit  Medication Sig Dispense Refill   Ascorbic Acid (VITAMIN C) 100 MG tablet Take 100 mg by mouth daily.     aspirin EC 81 MG tablet Take 1 tablet (81 mg total) by mouth daily. 90 tablet 3   atorvastatin (LIPITOR) 10 MG tablet TAKE 1 TABLET BY MOUTH  DAILY 90 tablet 3   Blood Glucose Monitoring Suppl (ONE TOUCH ULTRA 2) w/Device KIT Use as directed 1 kit 0   buPROPion (WELLBUTRIN XL) 300 MG 24 hr tablet Take  1 tablet  Daily for Mood , Focus & Concentration                              /                     TAKE                        BY                            MOUTH 90 tablet 3   Cholecalciferol (VITAMIN D3) 125 MCG (5000 UT) CAPS Take 5,000 Units by mouth daily.     docusate sodium (COLACE) 100 MG capsule Take 1 capsule (100 mg total) by mouth 2 (two) times daily. (Patient taking differently: Take 100 mg by mouth daily as needed for mild constipation or moderate constipation.) 30 capsule 0   furosemide (LASIX) 40 MG tablet Take 1 tablet 2 x /day as needed  for Fluid Retention (Patient taking differently: Take 20 mg by mouth 2 (two) times daily as needed for fluid or edema (Fluid Retention).) 180 tablet 3   gabapentin (NEURONTIN) 800 MG tablet TAKE 1 TABLET BY MOUTH 3 TO 4  TIMES DAILY FOR PAIN, HOT  FLASHES AND SWEATS (Patient taking differently: Take 1,600 mg by mouth 2 (two) times daily.  FOR PAIN, HOT FLASHES AND SWEATS) 360 tablet 3   glucose blood (ONETOUCH ULTRA) test strip CHECK BLOOD SUGAR 3 TIMES  DAILY 300 strip 3   glycopyrrolate (ROBINUL) 2 MG tablet Take  1 tablet  3 x /day  for Excessive Sweating (Patient taking differently: Take 2 mg by mouth 2 (two) times daily.  for Excessive Sweating) 90 tablet 11   HUMULIN 70/30 (70-30) 100 UNIT/ML injection INJECT SUBCUTANEOUSLY 50 UNITS  TWICE DAILY 100 mL 2   Insulin Syringes, Disposable, U-100 1 ML MISC 50 Units by Does not apply route 2 (two) times daily. inject 50 units into skin 2 x /day 200 each  3   Lancets (ONETOUCH DELICA PLUS [id number]) MISC CHECK BLOOD SUGAR 3 TIMES  DAILY 300 each 3   latanoprost (XALATAN) 0.005 % ophthalmic solution Place 1 drop into both eyes at bedtime.     Magnesium 250 MG TABS Take 250 mg by mouth daily.      metFORMIN (GLUCOPHAGE-XR) 500 MG 24 hr tablet TAKE 2  TABLETS BY MOUTH TWICE  DAILY WITH MEALS FOR DIABETES 360 tablet 3   metoprolol succinate (TOPROL-XL) 25 MG 24 hr tablet TAKE ONE-HALF TABLET BY MOUTH  DAILY 50 tablet 3   montelukast (SINGULAIR) 10 MG tablet TAKE 1 TABLET BY MOUTH DAILY FOR ALLERGIES 90 tablet 3   Multiple Vitamin (MULTIVITAMIN WITH MINERALS) TABS tablet Take 1 tablet by mouth daily.     nitroGLYCERIN (NITROSTAT) 0.4 MG SL tablet PLACE 1 TABLET UNDER THE TONGUE EVERY 5 MINUTES AS NEEDED FOR CHEST PAIN 25 tablet 6   ondansetron (ZOFRAN) 8 MG tablet Take 1 tablet (8 mg total) by mouth every 8 (eight) hours as needed for nausea or vomiting. 6 tablet 0   pantoprazole (PROTONIX) 40 MG tablet TAKE 1 TABLET BY MOUTH  DAILY TO PREVENT HEARTBURN  AND INDIGESTION 90 tablet 3   Polyethyl Glycol-Propyl Glycol (SYSTANE) 0.4-0.3 % SOLN Place 2 drops into both eyes daily as needed (for dry eyes).     tamsulosin (FLOMAX) 0.4 MG CAPS capsule Take 0.4 mg by mouth at bedtime.     Vitamin E 400 units TABS Take 800 Units by mouth daily.     Zinc 30 MG TABS Take 30 mg by mouth daily.     Na Sulfate-K Sulfate-Mg Sulf (SUPREP BOWEL PREP KIT) 17.5-3.13-1.6 GM/177ML SOLN Take 1 kit by mouth as directed. For colonoscopy prep (Patient not taking: Reported on 08/03/2022) 354 mL 0   No current facility-administered medications on file prior to visit.    Allergies: No Known Allergies  Current Problems (verified) has Hypertension (1991); GERD; ASCAD s/p PTCA (06/2012); Hyperlipidemia associated with type 2 diabetes mellitus ([place]); Vitamin D deficiency; Medication management; Diabetic peripheral neuropathy ([place]); Malignant neoplasm of prostate ([place]); Thyroiditis;  Obesity (BMI 30.0-34.9); Type 2 diabetes mellitus with vascular disease ([place]); Aortic atherosclerosis ([place]) by Abd CT scan 2015; Microalbuminuric diabetic nephropathy ([place]); Hypertriglyceridemia; Fatty infiltration of liver; Former smoker; Primary osteoarthritis of hip; AMS (altered mental status); Hypoglycemia due to insulin; Acute encephalopathy; Hypothermia; Diabetes mellitus with coincident hypertension ([place]); Diaphoresis; Inadequate sleep hygiene; Hypersomnia with sleep apnea; Excessive daytime sleepiness; Excessive postexertional fatigue; Adenomatous polyp of transverse colon; Hx of adenomatous colonic polyps; Post colonoscopy pain nausea and vomiting; and Abnormal colonoscopy on their problem list.  Screening Tests Immunization History  Administered Date(s) Administered   Influenza Split 06/26/2012   Influenza, High Dose Seasonal PF 06/20/2015, 06/23/2016, 06/14/2017, 06/14/2019, 07/01/2020, 07/21/2021, 08/03/2022   Influenza-Unspecified 06/14/2013, 06/25/2014, 06/03/2018   PFIZER(Purple Top)SARS-COV-2 Vaccination 10/28/2019, 11/22/2019, 06/26/2020   Pneumococcal Conjugate-13 04/23/2016   Pneumococcal Polysaccharide-23 06/14/2017   Pneumococcal-Unspecified 06/16/2010   Td 09/17/2003   Tdap 11/26/2013   Zoster, Live 07/12/2012    Health Maintenance  Topic Date Due   Diabetic kidney evaluation - Urine ACR  04/16/2022   COVID-19 Vaccine (4 - 2023-24 season) 05/15/2022   Zoster Vaccines- Shingrix (1 of 2) 11/03/2022 (Originally 06/02/1965)   HEMOGLOBIN A1C  02/01/2023   OPHTHALMOLOGY EXAM  06/17/2023   Diabetic kidney evaluation - eGFR measurement  08/04/2023   FOOT EXAM  08/04/2023   Medicare Annual Wellness (AWV)  08/04/2023   DTaP/Tdap/Td (3 - Td or Tdap) 11/27/2023   COLONOSCOPY (Pts 45-20yr Insurance coverage will need to be confirmed)  04/08/2032   Pneumonia Vaccine 75 Years old  Completed   INFLUENZA VACCINE  Completed   Hepatitis C Screening  Completed   HPV VACCINES  Aged  Out   Lung Cancer Screening  Discontinued    Names of Other Physician/Practitioners you currently  use: 1. [facility] here for primary care 2. Dr. Gershon Crane, eye doctor, last visit 06/17/22, mild glaucoma uses Latanoprost in both eyes 3. [facility], dentist, last visit 2023  Patient Care Team: [name], MD as PCP - General (Internal Medicine) Jerline Pain, MD as PCP - Cardiology (Cardiology) Carleene Mains, [facility] as Pharmacist (Pharmacist)  Surgical: He  has a past surgical history that includes Appendectomy; Cardiac surgery (~2017); left heart catheterization with coronary angiogram (N/A, 06/27/2012); left heart catheterization with coronary angiogram (N/A, 07/25/2012); Colonoscopy (02/12/2010); Radiology with anesthesia (N/A, 12/03/2015); Prostate biopsy; and Total hip arthroplasty (Right, 04/28/2019). Family Michael family history includes Diabetes in Michael sister. Social history  He reports that he quit smoking about 11 years ago. Michael smoking use included cigarettes. He started smoking about 64 years ago. He has a 37.50 pack-year smoking history. He has never used smokeless tobacco. He reports that he does not currently use alcohol. He reports that he does not use drugs.   Review of Systems  Constitutional:  Negative for chills, fever and weight loss.  HENT:  Positive for tinnitus. Negative for congestion and hearing loss.   Eyes:  Negative for blurred vision and double vision.  Respiratory:  Negative for cough and shortness of breath.   Cardiovascular:  Negative for chest pain, palpitations, orthopnea and leg swelling.  Gastrointestinal:  Negative for abdominal pain, constipation, diarrhea, heartburn, nausea and vomiting.       Decreased appetite  Genitourinary:  Negative for dysuria.  Musculoskeletal:  Negative for falls, joint pain and myalgias.  Skin:  Negative for rash.  Neurological:  Positive for tingling (burning pain in  feet). Negative for dizziness, tremors, loss of consciousness and headaches.  Endo/Heme/Allergies:  Does not bruise/bleed easily.  Psychiatric/Behavioral:  Negative for depression, memory loss and suicidal ideas.      Objective:   Today's Vitals   08/31/22 1114  BP: 100/68  Pulse: 62  Temp: (!) 97.5 F (36.4 C)  SpO2: 94%  Weight: 213 lb 6.4 oz (96.8 kg)  Height: _0  (1.778 m)    Body mass index is 30.62 kg/m.  General appearance: alert, no distress, WD/WN, male HEENT: normocephalic, sclerae anicteric, TMs pearly, nares patent, no discharge or erythema, pharynx normal Oral cavity: MMM, no lesions Neck: supple, no lymphadenopathy, no thyromegaly, no masses Heart: RRR, normal S1, S2, no murmurs Lungs: CTA bilaterally, no wheezes, rhonchi, or rales Abdomen: +bs, soft, non tender, non distended, no masses, no hepatomegaly, no splenomegaly Musculoskeletal: nontender, no swelling, no obvious deformity; slow sitting to standing, slow steady gait  Extremities: no edema, no cyanosis, no clubbing Pulses: 2+ symmetric, upper and lower extremities, normal cap refill Neurological: alert, oriented x 3, CN2-12 intact, strength normal upper extremities and lower extremities, sensation was normal bilateral feet to monofilament, DTRs 2+ throughout, no cerebellar signs Psychiatric: depressed affect, behavior normal    [facility]  08/31/2022

## 2022-08-27 NOTE — Telephone Encounter (Signed)
LM-08/27/22-Called pt. To r/s initial CP visit. Unable to reach pt. Unable to LVM, VM box is full. Assigned task to call patient in one month to try and r/s.  Total time spent: 2 min.

## 2022-08-31 ENCOUNTER — Encounter: Payer: Self-pay | Admitting: Nurse Practitioner

## 2022-08-31 ENCOUNTER — Ambulatory Visit (INDEPENDENT_AMBULATORY_CARE_PROVIDER_SITE_OTHER): Payer: Medicare Other | Admitting: Nurse Practitioner

## 2022-08-31 VITALS — BP 100/68 | HR 62 | Temp 97.5°F | Ht 70.0 in | Wt 213.4 lb

## 2022-08-31 DIAGNOSIS — I700 Atherosclerosis of aorta: Secondary | ICD-10-CM | POA: Diagnosis not present

## 2022-08-31 DIAGNOSIS — E1122 Type 2 diabetes mellitus with diabetic chronic kidney disease: Secondary | ICD-10-CM | POA: Diagnosis not present

## 2022-08-31 DIAGNOSIS — Z9861 Coronary angioplasty status: Secondary | ICD-10-CM

## 2022-08-31 DIAGNOSIS — I2510 Atherosclerotic heart disease of native coronary artery without angina pectoris: Secondary | ICD-10-CM | POA: Diagnosis not present

## 2022-08-31 DIAGNOSIS — Z794 Long term (current) use of insulin: Secondary | ICD-10-CM

## 2022-08-31 DIAGNOSIS — N182 Chronic kidney disease, stage 2 (mild): Secondary | ICD-10-CM | POA: Diagnosis not present

## 2022-08-31 MED ORDER — NITROGLYCERIN 0.4 MG SL SUBL: SUBLINGUAL_TABLET | SUBLINGUAL | 6 refills | Status: DC

## 2022-09-01 ENCOUNTER — Ambulatory Visit (INDEPENDENT_AMBULATORY_CARE_PROVIDER_SITE_OTHER): Payer: Medicare Other | Admitting: Nurse Practitioner

## 2022-09-01 VITALS — BP 118/72 | HR 59 | Temp 97.1°F | Ht 70.0 in | Wt 213.6 lb

## 2022-09-01 DIAGNOSIS — Z794 Long term (current) use of insulin: Secondary | ICD-10-CM | POA: Diagnosis not present

## 2022-09-01 DIAGNOSIS — N182 Chronic kidney disease, stage 2 (mild): Secondary | ICD-10-CM | POA: Diagnosis not present

## 2022-09-01 DIAGNOSIS — R040 Epistaxis: Secondary | ICD-10-CM | POA: Diagnosis not present

## 2022-09-01 DIAGNOSIS — E1122 Type 2 diabetes mellitus with diabetic chronic kidney disease: Secondary | ICD-10-CM | POA: Diagnosis not present

## 2022-09-01 DIAGNOSIS — R5383 Other fatigue: Secondary | ICD-10-CM

## 2022-09-01 LAB — CBC WITH DIFFERENTIAL/PLATELET
Absolute Monocytes: 465 cells/uL (ref 200–950)
Basophils Absolute: 28 cells/uL (ref 0–200)
Basophils Relative: 0.6 %
Eosinophils Absolute: 216 cells/uL (ref 15–500)
Eosinophils Relative: 4.7 %
HCT: 36.9 % — ABNORMAL LOW (ref 38.5–50.0)
Hemoglobin: 12.5 g/dL — ABNORMAL LOW (ref 13.2–17.1)
Lymphs Abs: 1674 cells/uL (ref 850–3900)
MCH: 29.8 pg (ref 27.0–33.0)
MCHC: 33.9 g/dL (ref 32.0–36.0)
MCV: 87.9 fL (ref 80.0–100.0)
MPV: 11.5 fL (ref 7.5–12.5)
Monocytes Relative: 10.1 %
Neutro Abs: 2217 cells/uL (ref 1500–7800)
Neutrophils Relative %: 48.2 %
Platelets: 176 10*3/uL (ref 140–400)
RBC: 4.2 10*6/uL (ref 4.20–5.80)
RDW: 12.3 % (ref 11.0–15.0)
Total Lymphocyte: 36.4 %
WBC: 4.6 10*3/uL (ref 3.8–10.8)

## 2022-09-01 NOTE — Progress Notes (Signed)
Assessment and Plan:  Michael Guzman was seen today for an episodic visit.  Diagnoses and all order for this visit:  Epistaxis Likely r/t nose picking - refrain from picking nose Suggest OTC Afrin for vasoconstriction. Call 911 if unable to stop nose bleed or lasting longer than 30 minutes Possible referral to ENT if s/s fail to improve Will obtain CBC considering c/o fatigue, sleeping an reports of blood loss.  - CBC with Differential/Platelet  Other fatigue Rest Discussed if any blood loss significant enough to drop Hbg he may continue to experience fatigue, SOB. Change positions slowly, for any dizziness. Continue to monitor  - CBC with Differential/Platelet   Notify office for further evaluation and treatment, questions or concerns if s/s fail to improve. The risks and benefits of my recommendations, as well as other treatment options were discussed with the patient today. Questions were answered.  Further disposition pending results of labs. Discussed med's effects and SE's.    Over 15 minutes of exam, counseling, chart review, and critical decision making was performed.   Future Appointments  Date Time Provider Department Center  12/04/2022 11:00 AM Raynelle Dick, NP [facility] None  05/06/2023  3:00 PM Lucky Cowboy, MD [facility] None    ------------------------------------------------------------------------------------------------------------------   HPI BP 118/72   Pulse (!) 59   Temp (!) 97.1 F (36.2 C)   Ht 5\' 10"  (1.778 m)   Wt 213 lb 9.6 oz (96.9 kg)   SpO2 95%   BMI 30.65 kg/m    76 y.o.male presents for episode of epistaxis.  States that he had frequent nose bleeds as a child.  He admits to picking the inside of his nose which may have triggered the nose bleed.  Started at 5 am and he reports it did not stop an hour later around 0630.  Reports gushing of blood.  He was concerned when he could not get the nose bleed to stop.  Feels he lost a  significant amount of blood using multiple towels and washcloths to help stop the bleeding.  He had to lay down to take a nap at noon and did not wake up until 3 pm.  This is not his normal daily routine.   Denies cold URI, sinus ineftion.  Denies HA, blurry vision.  Had HA during hte itme.    No pain, no head injury, no falling.  Past Medical History:  Diagnosis Date   Arthritis    CAD (coronary artery disease)    a.s/p DES to mid LAD and OM2 06/2012.   Chronic back pain    Chronically dry eyes    Diabetes mellitus without complication (HCC)    TYPE 2    DKA (diabetic ketoacidoses) 01/30/2017   Elevated PSA    being monitored   Fatty liver    GERD (gastroesophageal reflux disease)    Hx of radiation therapy    prostate , [facility]    Hypertension    Hypertriglyceridemia    Intermediate coronary syndrome (HCC) 07/26/2012   Prostate cancer (HCC)    RADTIATION      No Known Allergies  Current Outpatient Medications on File Prior to Visit  Medication Sig   Ascorbic Acid (VITAMIN C) 100 MG tablet Take 100 mg by mouth daily.   aspirin EC 81 MG tablet Take 1 tablet (81 mg total) by mouth daily.   atorvastatin (LIPITOR) 10 MG tablet TAKE 1 TABLET BY MOUTH  DAILY   Blood Glucose Monitoring Suppl (ONE TOUCH  ULTRA 2) w/Device KIT Use as directed   buPROPion (WELLBUTRIN XL) 300 MG 24 hr tablet Take  1 tablet  Daily for Mood , Focus & Concentration                              /                     TAKE                        BY                            MOUTH   Cholecalciferol (VITAMIN D3) 125 MCG (5000 UT) CAPS Take 5,000 Units by mouth daily.   docusate sodium (COLACE) 100 MG capsule Take 1 capsule (100 mg total) by mouth 2 (two) times daily. (Patient taking differently: Take 100 mg by mouth daily as needed for mild constipation or moderate constipation.)   furosemide (LASIX) 40 MG tablet Take 1 tablet 2 x /day as needed  for Fluid Retention (Patient taking differently:  Take 20 mg by mouth 2 (two) times daily as needed for fluid or edema (Fluid Retention).)   gabapentin (NEURONTIN) 800 MG tablet TAKE 1 TABLET BY MOUTH 3 TO 4  TIMES DAILY FOR PAIN, HOT  FLASHES AND SWEATS (Patient taking differently: Take 1,600 mg by mouth 2 (two) times daily.  FOR PAIN, HOT FLASHES AND SWEATS)   glucose blood (ONETOUCH ULTRA) test strip CHECK BLOOD SUGAR 3 TIMES  DAILY   glycopyrrolate (ROBINUL) 2 MG tablet Take  1 tablet  3 x /day  for Excessive Sweating (Patient taking differently: Take 2 mg by mouth 2 (two) times daily.  for Excessive Sweating)   HUMULIN 70/30 (70-30) 100 UNIT/ML injection INJECT SUBCUTANEOUSLY 50 UNITS  TWICE DAILY   Insulin Syringes, Disposable, U-100 1 ML MISC 50 Units by Does not apply route 2 (two) times daily. inject 50 units into skin 2 x /day   Lancets (ONETOUCH DELICA PLUS LANCET33G) MISC CHECK BLOOD SUGAR 3 TIMES  DAILY   latanoprost (XALATAN) 0.005 % ophthalmic solution Place 1 drop into both eyes at bedtime.   Magnesium 250 MG TABS Take 250 mg by mouth daily.    metFORMIN (GLUCOPHAGE-XR) 500 MG 24 hr tablet TAKE 2 TABLETS BY MOUTH TWICE  DAILY WITH MEALS FOR DIABETES   metoprolol succinate (TOPROL-XL) 25 MG 24 hr tablet TAKE ONE-HALF TABLET BY MOUTH  DAILY   montelukast (SINGULAIR) 10 MG tablet TAKE 1 TABLET BY MOUTH DAILY FOR ALLERGIES   Multiple Vitamin (MULTIVITAMIN WITH MINERALS) TABS tablet Take 1 tablet by mouth daily.   Na Sulfate-K Sulfate-Mg Sulf (SUPREP BOWEL PREP KIT) 17.5-3.13-1.6 GM/177ML SOLN Take 1 kit by mouth as directed. For colonoscopy prep   nitroGLYCERIN (NITROSTAT) 0.4 MG SL tablet PLACE 1 TABLET UNDER THE TONGUE EVERY 5 MINUTES AS NEEDED FOR CHEST PAIN   ondansetron (ZOFRAN) 8 MG tablet Take 1 tablet (8 mg total) by mouth every 8 (eight) hours as needed for nausea or vomiting.   pantoprazole (PROTONIX) 40 MG tablet TAKE 1 TABLET BY MOUTH  DAILY TO PREVENT HEARTBURN  AND INDIGESTION   Polyethyl Glycol-Propyl Glycol (SYSTANE)  0.4-0.3 % SOLN Place 2 drops into both eyes daily as needed (for dry eyes).   tamsulosin (FLOMAX) 0.4 MG CAPS capsule Take 0.4 mg by mouth at  bedtime.   Vitamin E 400 units TABS Take 800 Units by mouth daily.   Zinc 30 MG TABS Take 30 mg by mouth daily.   No current facility-administered medications on file prior to visit.    ROS: all negative except what is noted in the HPI.   Physical Exam:  BP 118/72   Pulse (!) 59   Temp (!) 97.1 F (36.2 C)   Ht 5\' 10"  (1.778 m)   Wt 213 lb 9.6 oz (96.9 kg)   SpO2 95%   BMI 30.65 kg/m   General Appearance: NAD.  Awake, conversant and cooperative. Eyes: PERRLA, EOMs intact.  Sclera white.  Conjunctiva without erythema. Sinuses: No frontal/maxillary tenderness.  No nasal discharge. Nares patent. No sores in nose, nares patent, no erythema.    ENT/Mouth: Ext aud canals clear.  Bilateral TMs w/DOL and without erythema or bulging. Hearing intact.  Posterior pharynx without swelling or exudate.  Tonsils without swelling or erythema.  Neck: Supple.  No masses, nodules or thyromegaly. Respiratory: Effort is regular with non-labored breathing. Breath sounds are equal bilaterally without rales, rhonchi, wheezing or stridor.  Cardio: RRR with no MRGs. Brisk peripheral pulses without edema.  Abdomen: Active BS in all four quadrants.  Soft and non-tender without guarding, rebound tenderness, hernias or masses. Lymphatics: Non tender without lymphadenopathy.  Musculoskeletal: Full ROM, 5/5 strength, normal ambulation.  No clubbing or cyanosis. Skin: Appropriate color for ethnicity. Warm without rashes, lesions, ecchymosis, ulcers.  Neuro: CN II-XII grossly normal. Normal muscle tone without cerebellar symptoms and intact sensation.   Psych: AO X 3,  appropriate mood and affect, insight and judgment.     Adela Glimpse, NP 4:53 PM [facility]

## 2022-09-02 LAB — URINALYSIS, ROUTINE W REFLEX MICROSCOPIC
Bilirubin Urine: NEGATIVE
Glucose, UA: NEGATIVE
Hgb urine dipstick: NEGATIVE
Ketones, ur: NEGATIVE
Leukocytes,Ua: NEGATIVE
Nitrite: NEGATIVE
Protein, ur: NEGATIVE
Specific Gravity, Urine: 1.016 (ref 1.001–1.035)
pH: 5.5 (ref 5.0–8.0)

## 2022-09-02 LAB — MICROALBUMIN / CREATININE URINE RATIO
Creatinine, Urine: 114 mg/dL (ref 20–320)
Microalb Creat Ratio: 2 mcg/mg creat (ref <30)
Microalb, Ur: 0.2 mg/dL

## 2022-09-02 NOTE — Patient Instructions (Signed)
Nosebleed, Adult ?A nosebleed is when blood comes out of the nose. Nosebleeds are common and can be caused by many things. They are usually not a sign of a serious medical problem. ?Follow these instructions at home: ?When you have a nosebleed: ? ?Sit down. ?Tilt your head forward a little. ?Follow these steps: ?Pinch your nose with a clean towel or tissue. ?Keep pinching your nose for 5 minutes. Do not let go. ?After 5 minutes, let go of your nose. ?Keep doing these steps until the bleeding stops. ?Do not put tissues or other things in your nose to stop the bleeding. ?Avoid lying down or putting your head back. ?Use a nose spray decongestant as told by your doctor. ?After a nosebleed: ?Try not to blow your nose or sniffle for several hours. ?Try not to strain, lift, or bend at the waist for several days. ?Aspirin and medicines that thin your blood make bleeding more likely. If you take these medicines: ?Ask your doctor if you should stop taking them or if you should change how much you take. ?Do not stop taking the medicine unless your doctor tells you to. ?If your nosebleed was caused by dryness, use over-the-counter saline nasal spray or gel and a humidifier as told by your doctor. This will keep the inside of your nose moist and allow it to heal. If you need to use nasal spray or gel: ?Choose one that is water-soluble. ?Use only as much as you need and use it only as often as needed. ?Do not lie down right away after you use it. ?If you get nosebleeds often, talk with your doctor about treatments. These may include: ?Nasal cautery. A chemical swab or electrical device is used to lightly burn tiny blood vessels inside the nose. This helps stop or prevent nosebleeds. ?Nasal packing. A gauze or other material is placed in the nose to keep constant pressure on the bleeding area. ?Contact a doctor if: ?You have a fever. ?You get nosebleeds often. ?You get nosebleeds more often than usual. ?You bruise very  easily. ?You have something stuck in your nose. ?You are bleeding in your mouth. ?You vomit or cough up brown material. ?You get a nosebleed after you start a new medicine. ?Get help right away if: ?You have a nosebleed after you fall or hurt your head. ?Your nosebleed does not go away after 20 minutes. ?You feel dizzy or weak. ?You have unusual bleeding from other parts of your body. ?You have unusual bruising on other parts of your body. ?You get sweaty. ?You vomit blood. ?Summary ?Nosebleeds are common. They are usually not a sign of a serious medical problem. ?When you have a nosebleed, sit down and tilt your head a little forward. Pinch your nose with a clean tissue for 5 minutes. ?Use saline spray or saline gel and a humidifier as told by your doctor. ?Get help right away if your nosebleed does not go away after 20 minutes. ?This information is not intended to replace advice given to you by your health care provider. Make sure you discuss any questions you have with your health care provider. ?Document Revised: 09/09/2021 Document Reviewed: 09/09/2021 ?Elsevier Patient Education ? 2023 Else[employer] Inc[employer] ? ?

## 2022-09-15 ENCOUNTER — Encounter (HOSPITAL_COMMUNITY): Payer: Self-pay | Admitting: Gastroenterology

## 2022-09-15 ENCOUNTER — Other Ambulatory Visit: Payer: Self-pay

## 2022-09-21 ENCOUNTER — Encounter (HOSPITAL_COMMUNITY): Payer: Self-pay | Admitting: Gastroenterology

## 2022-09-21 ENCOUNTER — Other Ambulatory Visit: Payer: Self-pay

## 2022-09-21 ENCOUNTER — Ambulatory Visit (HOSPITAL_COMMUNITY)
Admission: RE | Admit: 2022-09-21 | Discharge: 2022-09-21 | Disposition: A | Discharge status: Home / Self Care | Payer: Medicare Other | Source: Ambulatory Visit | Attending: Gastroenterology | Admitting: Gastroenterology

## 2022-09-21 ENCOUNTER — Ambulatory Visit (HOSPITAL_BASED_OUTPATIENT_CLINIC_OR_DEPARTMENT_OTHER): Payer: Medicare Other | Admitting: Anesthesiology

## 2022-09-21 ENCOUNTER — Ambulatory Visit (HOSPITAL_COMMUNITY): Payer: Medicare Other | Admitting: Anesthesiology

## 2022-09-21 ENCOUNTER — Encounter (HOSPITAL_COMMUNITY)
Admission: RE | Disposition: A | Discharge status: Home / Self Care | Payer: Self-pay | Source: Ambulatory Visit | Attending: Gastroenterology

## 2022-09-21 DIAGNOSIS — I2510 Atherosclerotic heart disease of native coronary artery without angina pectoris: Secondary | ICD-10-CM | POA: Insufficient documentation

## 2022-09-21 DIAGNOSIS — K635 Polyp of colon: Secondary | ICD-10-CM | POA: Diagnosis not present

## 2022-09-21 DIAGNOSIS — K641 Second degree hemorrhoids: Secondary | ICD-10-CM

## 2022-09-21 DIAGNOSIS — D123 Benign neoplasm of transverse colon: Secondary | ICD-10-CM | POA: Diagnosis not present

## 2022-09-21 DIAGNOSIS — E119 Type 2 diabetes mellitus without complications: Secondary | ICD-10-CM

## 2022-09-21 DIAGNOSIS — D490 Neoplasm of unspecified behavior of digestive system: Secondary | ICD-10-CM

## 2022-09-21 DIAGNOSIS — I10 Essential (primary) hypertension: Secondary | ICD-10-CM | POA: Diagnosis not present

## 2022-09-21 DIAGNOSIS — Z86010 Personal history of colonic polyps: Secondary | ICD-10-CM | POA: Diagnosis not present

## 2022-09-21 DIAGNOSIS — Z7984 Long term (current) use of oral hypoglycemic drugs: Secondary | ICD-10-CM

## 2022-09-21 DIAGNOSIS — K219 Gastro-esophageal reflux disease without esophagitis: Secondary | ICD-10-CM | POA: Diagnosis not present

## 2022-09-21 DIAGNOSIS — Z87891 Personal history of nicotine dependence: Secondary | ICD-10-CM | POA: Diagnosis not present

## 2022-09-21 HISTORY — PX: ENDOSCOPIC MUCOSAL RESECTION: SHX6839

## 2022-09-21 HISTORY — PX: COLONOSCOPY WITH PROPOFOL: SHX5780

## 2022-09-21 HISTORY — PX: SUBMUCOSAL LIFTING INJECTION: SHX6855

## 2022-09-21 HISTORY — PX: HEMOSTASIS CLIP PLACEMENT: SHX6857

## 2022-09-21 HISTORY — DX: Myoneural disorder, unspecified: G70.9

## 2022-09-21 LAB — GLUCOSE, CAPILLARY: Glucose-Capillary: 122 mg/dL — ABNORMAL HIGH (ref 70–99)

## 2022-09-21 SURGERY — COLONOSCOPY WITH PROPOFOL
Anesthesia: Monitor Anesthesia Care

## 2022-09-21 MED ORDER — LACTATED RINGERS IV SOLN: INTRAVENOUS | Status: DC

## 2022-09-21 MED ORDER — PROPOFOL 1000 MG/100ML IV EMUL
INTRAVENOUS | Status: AC
  Filled 2022-09-21: qty 100

## 2022-09-21 MED ORDER — PROPOFOL 500 MG/50ML IV EMUL
INTRAVENOUS | Status: DC | PRN
  Administered 2022-09-21: 130 ug/kg/min via INTRAVENOUS

## 2022-09-21 MED ORDER — PROPOFOL 10 MG/ML IV BOLUS
INTRAVENOUS | Status: DC | PRN
  Administered 2022-09-21 (×2): 20 mg via INTRAVENOUS

## 2022-09-21 SURGICAL SUPPLY — 22 items

## 2022-09-21 NOTE — Discharge Instructions (Signed)

## 2022-09-21 NOTE — Transfer of Care (Signed)
Immediate Anesthesia Transfer of Care Note  Patient: Michael Guzman  Procedure(s) Performed: COLONOSCOPY WITH PROPOFOL ENDOSCOPIC MUCOSAL RESECTION SUBMUCOSAL LIFTING INJECTION HEMOSTASIS CLIP PLACEMENT  Patient Location: PACU  Anesthesia Type:MAC  Level of Consciousness: sedated  Airway & Oxygen Therapy: Patient Spontanous Breathing and Patient connected to face mask oxygen  Post-op Assessment: Report given to RN and Post -op Vital signs reviewed and stable  Post vital signs: Reviewed and stable  Last Vitals:  Vitals Value Taken Time  BP    Temp    Pulse 59 09/21/22 1247  Resp 10 09/21/22 1247  SpO2 100 % 09/21/22 1247  Vitals shown include unvalidated device data.  Last Pain:  Vitals:   09/21/22 1116  TempSrc: Temporal  PainSc: 0-No pain         Complications: No notable events documented.

## 2022-09-21 NOTE — Anesthesia Preprocedure Evaluation (Addendum)
Anesthesia Evaluation  Patient identified by MRN, date of birth, ID band Patient awake    Reviewed: Allergy & Precautions, NPO status , Patient's Chart, lab work & pertinent test results  History of Anesthesia Complications Negative for: history of anesthetic complications  Airway Mallampati: II  TM Distance: >3 FB Neck ROM: Full    Dental  (+) Chipped, Dental Advisory Given   Pulmonary former smoker   Pulmonary exam normal        Cardiovascular hypertension, + CAD  Normal cardiovascular exam  2020 Myoperfusion scan Normal perfusion. LVEF 55% with normal wall motion. This is a low risk study. Compared with a prior study in 2017, there is no longer ischemia.      Neuro/Psych negative neurological ROS     GI/Hepatic Neg liver ROS,GERD  ,,  Endo/Other  diabetes    Renal/GU negative Renal ROS     Musculoskeletal negative musculoskeletal ROS (+)    Abdominal   Peds  Hematology negative hematology ROS (+)   Anesthesia Other Findings   Reproductive/Obstetrics                             Anesthesia Physical Anesthesia Plan  ASA: 3  Anesthesia Plan: MAC   Post-op Pain Management: Minimal or no pain anticipated   Induction:   PONV Risk Score and Plan: 1 and Ondansetron and Propofol infusion  Airway Management Planned: Natural Airway and Simple Face Mask  Additional Equipment:   Intra-op Plan:   Post-operative Plan:   Informed Consent: I have reviewed the patients History and Physical, chart, labs and discussed the procedure including the risks, benefits and alternatives for the proposed anesthesia with the patient or authorized representative who has indicated his/her understanding and acceptance.     Dental advisory given  Plan Discussed with: CRNA and Anesthesiologist  Anesthesia Plan Comments:        Anesthesia Quick Evaluation

## 2022-09-21 NOTE — H&P (Signed)
GASTROENTEROLOGY PROCEDURE H&P NOTE   Primary Care Physician: [name], MD  HPI: Michael Guzman is a 77 y.o. male who presents for Colonoscopy for attempt at endoscopic resection of a TC polyp.  Past Medical History:  Diagnosis Date   Arthritis    CAD (coronary artery disease)    a.s/p DES to mid LAD and OM2 06/2012.   Chronic back pain    Chronically dry eyes    Diabetes mellitus without complication (HCC)    TYPE 2    DKA (diabetic ketoacidoses) 01/30/2017   Elevated PSA    being monitored   Fatty liver    GERD (gastroesophageal reflux disease)    Hx of radiation therapy    prostate , [facility]    Hypertension    Hypertriglyceridemia    Intermediate coronary syndrome ([place]) 07/26/2012   Neuromuscular disorder ([place])    neuropathy feet and legs   Prostate cancer ([place])    RADTIATION    Past Surgical History:  Procedure Laterality Date   APPENDECTOMY     CARDIAC SURGERY  ~2017   3 stent placed.   COLONOSCOPY  02/12/2010   [facility]   LEFT HEART CATHETERIZATION WITH CORONARY ANGIOGRAM N/A 06/27/2012   Procedure: LEFT HEART CATHETERIZATION WITH CORONARY ANGIOGRAM;  Surgeon: Candee Furbish, MD;  Location: [facility] CATH LAB;  Service: Cardiovascular;  Laterality: N/A;   LEFT HEART CATHETERIZATION WITH CORONARY ANGIOGRAM N/A 07/25/2012   Procedure: LEFT HEART CATHETERIZATION WITH CORONARY ANGIOGRAM;  Surgeon: Sueanne Margarita, MD;  Location: [facility] CATH LAB;  Service: Cardiovascular;  Laterality: N/A;   PROSTATE BIOPSY     RADIOLOGY WITH ANESTHESIA N/A 12/03/2015   Procedure: MRI LUMBAR SPINE;  Surgeon: Medication Radiologist, MD;  Location: [facility];  Service: Radiology;  Laterality: N/A;   TOTAL HIP ARTHROPLASTY Right 04/28/2019   Procedure: TOTAL HIP ARTHROPLASTY ANTERIOR APPROACH;  Surgeon: Dorna Leitz, MD;  Location: [facility]S;  Service: Orthopedics;  Laterality: Right;   No current facility-administered medications for this encounter.   No current  facility-administered medications for this encounter. No Known Allergies Family History  Problem Relation Age of Onset   Diabetes Sister    Stomach cancer Neg Hx    Colon cancer Neg Hx    Esophageal cancer Neg Hx    Inflammatory bowel disease Neg Hx    Liver disease Neg Hx    Pancreatic cancer Neg Hx    Rectal cancer Neg Hx    Social History   Socioeconomic History   Marital status: Married    Spouse name: Not on file   Number of children: 4   Years of education: 2 y colleg   Highest education level: Not on file  Occupational History   Occupation: SUPERVISOR    Employer: [employer]   Occupation: retired  Tobacco Use   Smoking status: Former    Packs/day: 0.75    Years: 50.00    Total pack years: 37.50    Types: Cigarettes    Start date: 60    Quit date: 07/27/2011    Years since quitting: 11.1   Smokeless tobacco: Never  Vaping Use   Vaping Use: Never used  Substance and Sexual Activity   Alcohol use: Not Currently    Comment: qut 20 years ago per patient    Drug use: No   Sexual activity: Not Currently  Other Topics Concern   Not on file  Social History Narrative   Works as    Investment banker, operational  of Health   Financial Resource Strain: Not on file  Food Insecurity: Not on file  Transportation Needs: Not on file  Physical Activity: Not on file  Stress: Not on file  Social Connections: Not on file  Intimate Partner Violence: Not on file    Physical Exam: There were no vitals filed for this visit. There is no height or weight on file to calculate BMI. GEN: NAD EYE: Sclerae anicteric ENT: MMM CV: Non-tachycardic GI: Soft, NT/ND NEURO:  Alert & Oriented x 3  Lab Results: No results for input(s): "WBC", "HGB", "HCT", "PLT" in the last 72 hours. BMET No results for input(s): "NA", "K", "CL", "CO2", "GLUCOSE", "BUN", "CREATININE", "CALCIUM" in the last 72 hours. LFT No results for input(s): "PROT", "ALBUMIN", "AST", "ALT", "ALKPHOS",  "BILITOT", "BILIDIR", "IBILI" in the last 72 hours. PT/INR No results for input(s): "LABPROT", "INR" in the last 72 hours.   Impression / Plan: This is a 77 y.o.male who presents for Colonoscopy for attempt at endoscopic resection of a TC polyp.  The risks and benefits of endoscopic evaluation/treatment were discussed with the patient and/or family; these include but are not limited to the risk of perforation, infection, bleeding, missed lesions, lack of diagnosis, severe illness requiring hospitalization, as well as anesthesia and sedation related illnesses.  The patient's history has been reviewed, patient examined, no change in status, and deemed stable for procedure.  The patient and/or family is agreeable to proceed.    Justice Britain, MD [facility] Office # [phone]

## 2022-09-21 NOTE — Op Note (Signed)
[facility] Patient Name: Michael Guzman Procedure Date: 09/21/2022 MRN: [id number] Attending MD: Justice Britain , MD, [id number] Date of Birth: August 21, 1946 CSN: [id number] Age: 77 Admit Type: Outpatient Procedure:                Colonoscopy Indications:              Excision of colonic polyp Providers:                Justice Britain, MD, Carlyn Reichert, RN, [facility] Technician, Technician Referring MD:             Otis Brace, MD, [name] Medicines:                Monitored Anesthesia Care Complications:            No immediate complications. Estimated Blood Loss:     Estimated blood loss was minimal. Procedure:                Pre-Anesthesia Assessment:                           - Prior to the procedure, a History and Physical                            was performed, and patient medications and                            allergies were reviewed. The patient's tolerance of                            previous anesthesia was also reviewed. The risks                            and benefits of the procedure and the sedation                            options and risks were discussed with the patient.                            All questions were answered, and informed consent                            was obtained. Prior Anticoagulants: The patient has                            taken no anticoagulant or antiplatelet agents                            except for aspirin. ASA Grade Assessment: III - A                            patient with severe systemic disease. After  reviewing the risks and benefits, the patient was                            deemed in satisfactory condition to undergo the                            procedure.                           After obtaining informed consent, the colonoscope                            was passed under direct vision. Throughout the                             procedure, the patient's blood pressure, pulse, and                            oxygen saturations were monitored continuously. The                            CF-HQ190L ([id number]) Olympus colonoscope was                            introduced through the anus and advanced to the the                            cecum, identified by appendiceal orifice and                            ileocecal valve. The colonoscopy was somewhat                            difficult due to significant looping. Successful                            completion of the procedure was aided by changing                            the patient's position, using manual pressure,                            straightening and shortening the scope to obtain                            bowel loop reduction and using scope torsion. Scope In: 11:52:04 AM Scope Out: 12:41:46 PM Scope Withdrawal Time: 0 hours 41 minutes 1 second  Total Procedure Duration: 0 hours 49 minutes 42 seconds  Findings:      The digital rectal exam findings include hemorrhoids. Pertinent       negatives include no palpable rectal lesions.      The colon (entire examined portion) revealed significantly excessive       looping.      A 28 mm polypoid lesion was found in the distal transverse colon just  proximal and underneath was tattoo. The lesion was granular lateral       spreading (hard to find). No bleeding was present. Preparations were       made for mucosal resection. Demarcation of the lesion was performed with       high-definition white light and narrow band imaging to clearly identify       the boundaries of the lesion. EverLift was injected to raise the lesion.       Piecemeal cold mucosal resection using a snare was performed. Resection       and retrieval were complete. Resected tissue margins were examined and       clear of polyp tissue. To prevent bleeding after mucosal resection, five       hemostatic clips were successfully  placed (MR conditional). Clip       manufacturer: [employer]. There was no bleeding at the end of the       procedure.      Normal mucosa was found in the entire colon otherwise.      Non-bleeding non-thrombosed internal hemorrhoids were found during       retroflexion, during perianal exam and during digital exam. The       hemorrhoids were Grade II (internal hemorrhoids that prolapse but reduce       spontaneously). Impression:               - Hemorrhoids found on digital rectal exam.                           - There was significant looping of the colon.                           - Polypoid lesion in the distal transverse colon                            just proximal to tattoo and underneath it as well.                            Complete removal was accomplished by piecemeal cold                            mucosal resection. Clips (MR conditional) were                            placed. Clip manufacturer: [employer].                           - Normal mucosa in the entire examined colon                            otherwise.                           - Non-bleeding non-thrombosed internal hemorrhoids. Moderate Sedation:      Not Applicable - Patient had care per Anesthesia. Recommendation:           - The patient will be observed post-procedure,  until all discharge criteria are met.                           - Discharge patient to home.                           - Patient has a contact number available for                            emergencies. The signs and symptoms of potential                            delayed complications were discussed with the                            patient. Return to normal activities tomorrow.                            Written discharge instructions were provided to the                            patient.                           - Continue present medications.                           - Await pathology  results.                           - Repeat colonoscopy in 9-12 months for                            surveillance.                           - The findings and recommendations were discussed                            with the patient.                           - The findings and recommendations were discussed                            with the patient's family. Procedure Code(s):        --- Professional ---                           [phone], Colonoscopy, flexible; with endoscopic                            mucosal resection Diagnosis Code(s):        --- Professional ---                           K64.1, Second degree hemorrhoids  D49.0, Neoplasm of unspecified behavior of                            digestive system                           K63.5, Polyp of colon CPT copyright 2022 [employer]. All rights reserved. The codes documented in this report are preliminary and upon coder review may  be revised to meet current compliance requirements. Justice Britain, MD 09/21/2022 1:09:34 PM Number of Addenda: 0

## 2022-09-21 NOTE — Anesthesia Postprocedure Evaluation (Signed)
Anesthesia Post Note  Patient: Michael Guzman  Procedure(s) Performed: COLONOSCOPY WITH PROPOFOL ENDOSCOPIC MUCOSAL RESECTION SUBMUCOSAL LIFTING INJECTION HEMOSTASIS CLIP PLACEMENT     Patient location during evaluation: Endoscopy Anesthesia Type: MAC Level of consciousness: awake and alert Pain management: pain level controlled Vital Signs Assessment: post-procedure vital signs reviewed and stable Respiratory status: spontaneous breathing, nonlabored ventilation, respiratory function stable and patient connected to nasal cannula oxygen Cardiovascular status: blood pressure returned to baseline and stable Postop Assessment: no apparent nausea or vomiting Anesthetic complications: no  No notable events documented.  Last Vitals:  Vitals:   09/21/22 1302 09/21/22 1307  BP: 133/78 (!) 154/93  Pulse: 60 (!) 57  Resp: 12 17  Temp:    SpO2: 100% 100%    Last Pain:  Vitals:   09/21/22 1307  TempSrc:   PainSc: 0-No pain                 [name]

## 2022-09-22 LAB — SURGICAL PATHOLOGY

## 2022-09-23 ENCOUNTER — Encounter: Payer: Self-pay | Admitting: Gastroenterology

## 2022-09-23 DIAGNOSIS — H401131 Primary open-angle glaucoma, bilateral, mild stage: Secondary | ICD-10-CM | POA: Diagnosis not present

## 2022-09-24 ENCOUNTER — Encounter (HOSPITAL_COMMUNITY): Payer: Self-pay | Admitting: Gastroenterology

## 2022-10-08 ENCOUNTER — Encounter: Payer: Self-pay | Admitting: Internal Medicine

## 2022-10-23 ENCOUNTER — Telehealth: Payer: Self-pay

## 2022-10-23 NOTE — Progress Notes (Signed)
Chart prep started, completed and uploaded to [employer] for CPP review. Reviewed office visits, specialists visits, hospital visits, medication, adherence, and labs. Confirmed phone visit.   Time spent: 13mn.  Michael Guzman CTL

## 2022-10-27 ENCOUNTER — Telehealth: Payer: Medicare Other | Admitting: Pharmacy Technician

## 2022-10-27 DIAGNOSIS — R5383 Other fatigue: Secondary | ICD-10-CM | POA: Diagnosis not present

## 2022-10-27 DIAGNOSIS — R197 Diarrhea, unspecified: Secondary | ICD-10-CM | POA: Diagnosis not present

## 2022-10-27 DIAGNOSIS — R112 Nausea with vomiting, unspecified: Secondary | ICD-10-CM | POA: Diagnosis not present

## 2022-10-28 ENCOUNTER — Other Ambulatory Visit: Payer: Self-pay | Admitting: Nurse Practitioner

## 2022-11-12 ENCOUNTER — Telehealth: Payer: Self-pay

## 2022-11-12 NOTE — Progress Notes (Unsigned)
Patient: Michael Guzman DOB: 13-Feb-2046 Time: 10:52AM  HC reviewing patients chart prior to calling to reschedule initial CCM visit with CPP. HC called pt and [facility].  Total time spent: 84mn  11/16/22  LFort Salonga

## 2022-12-04 ENCOUNTER — Ambulatory Visit (INDEPENDENT_AMBULATORY_CARE_PROVIDER_SITE_OTHER): Payer: Medicare Other | Admitting: Nurse Practitioner

## 2022-12-04 ENCOUNTER — Encounter: Payer: Self-pay | Admitting: Nurse Practitioner

## 2022-12-04 VITALS — BP 122/80 | HR 66 | Temp 97.3°F | Ht 70.0 in | Wt 201.8 lb

## 2022-12-04 DIAGNOSIS — E119 Type 2 diabetes mellitus without complications: Secondary | ICD-10-CM

## 2022-12-04 DIAGNOSIS — I10 Essential (primary) hypertension: Secondary | ICD-10-CM | POA: Diagnosis not present

## 2022-12-04 DIAGNOSIS — I2510 Atherosclerotic heart disease of native coronary artery without angina pectoris: Secondary | ICD-10-CM | POA: Diagnosis not present

## 2022-12-04 DIAGNOSIS — E1159 Type 2 diabetes mellitus with other circulatory complications: Secondary | ICD-10-CM

## 2022-12-04 DIAGNOSIS — K219 Gastro-esophageal reflux disease without esophagitis: Secondary | ICD-10-CM | POA: Diagnosis not present

## 2022-12-04 DIAGNOSIS — G4733 Obstructive sleep apnea (adult) (pediatric): Secondary | ICD-10-CM | POA: Diagnosis not present

## 2022-12-04 DIAGNOSIS — R6889 Other general symptoms and signs: Secondary | ICD-10-CM

## 2022-12-04 DIAGNOSIS — E1142 Type 2 diabetes mellitus with diabetic polyneuropathy: Secondary | ICD-10-CM | POA: Diagnosis not present

## 2022-12-04 DIAGNOSIS — R630 Anorexia: Secondary | ICD-10-CM

## 2022-12-04 DIAGNOSIS — Z9861 Coronary angioplasty status: Secondary | ICD-10-CM

## 2022-12-04 DIAGNOSIS — N182 Chronic kidney disease, stage 2 (mild): Secondary | ICD-10-CM

## 2022-12-04 DIAGNOSIS — C61 Malignant neoplasm of prostate: Secondary | ICD-10-CM | POA: Diagnosis not present

## 2022-12-04 DIAGNOSIS — E069 Thyroiditis, unspecified: Secondary | ICD-10-CM

## 2022-12-04 DIAGNOSIS — E785 Hyperlipidemia, unspecified: Secondary | ICD-10-CM | POA: Diagnosis not present

## 2022-12-04 DIAGNOSIS — Z0001 Encounter for general adult medical examination with abnormal findings: Secondary | ICD-10-CM | POA: Diagnosis not present

## 2022-12-04 DIAGNOSIS — I700 Atherosclerosis of aorta: Secondary | ICD-10-CM | POA: Diagnosis not present

## 2022-12-04 DIAGNOSIS — E1122 Type 2 diabetes mellitus with diabetic chronic kidney disease: Secondary | ICD-10-CM

## 2022-12-04 DIAGNOSIS — Z87891 Personal history of nicotine dependence: Secondary | ICD-10-CM | POA: Diagnosis not present

## 2022-12-04 DIAGNOSIS — Z0000 Encounter for general adult medical examination without abnormal findings: Secondary | ICD-10-CM

## 2022-12-04 DIAGNOSIS — E1169 Type 2 diabetes mellitus with other specified complication: Secondary | ICD-10-CM

## 2022-12-04 DIAGNOSIS — Z79899 Other long term (current) drug therapy: Secondary | ICD-10-CM

## 2022-12-04 DIAGNOSIS — Z794 Long term (current) use of insulin: Secondary | ICD-10-CM

## 2022-12-04 DIAGNOSIS — E669 Obesity, unspecified: Secondary | ICD-10-CM

## 2022-12-04 NOTE — Progress Notes (Signed)
MEDICARE ANNUAL WELLNESS VISIT AND FOLLOW UP   Assessment:    Encounter for Medicare annual wellness exam Yearly  Essential hypertension - continue medications, DASH diet, exercise and monitor at home. Call if greater than 130/80.  -     CBC with Differential/Platelet -     CMP/GFR  Type 2 diabetes mellitus with stage 2 chronic kidney disease, with long-term current use of insulin (HCC) Blood sugars are running low 90-100, Has not been taking his insulin NPH unless BS greater than 130 which has been rare- only use 30-40 units as needed- continue to monitor Continue Metformin 1000mg  BID Monitor injection sites Education: Reviewed 'ABCs' of diabetes management (respective goals in parentheses):  A1C (<7.5), blood pressure (<130/80), and cholesterol (LDL <70) Eye Exam yearly and Dental Exam every 6 months. Dietary recommendations Physical Activity recommendations -     Hemoglobin A1c   ASCAD s/p PTCA (06/2012) Control blood pressure, cholesterol, glucose, increase exercise.  Followed by cardiology  Gastroesophageal reflux disease without esophagitis Continue PPI/H2 blocker, diet discussed  Diabetic peripheral neuropathy ([place]) Check feet daily. Normal foot exam today Continue Gabapentin -     Hemoglobin A1c Increase fluids, avoid NSAIDS, control sugars/BP, will monitor  Thyroiditis Monitor, normal scan 2017, followed by Dr. Cruzita Lederer -     TSH  Mixed hyperlipidemia with Type 2 Diabetes Mellitus(HCC) Continue medications: Atorvastatin 10 mg QD Discussed dietary and exercise modifications Low fat diet Lipid panel  Medication management Continued  Overweight Long discussion about weight loss, diet, and exercise Recommended diet heavy in fruits and veggies and low in animal meats, cheeses, and dairy products, appropriate calorie intake Patient will work on walking daily 10-15 min, twice a day if can Has lost Discussed appropriate weight for height  Follow up at next  visit  Atherosclerosis of aorta [facility]) Chest Xray 01/11/21 Control blood pressure, cholesterol, glucose, increase exercise.   Fatty infiltration of liver Weight loss advised, avoid alcohol/tylenol, will monitor LFTs  Prostate cancer Followed by Dr. Junious Silk, treated by radiation, completed lupron  Former smoker (37 pack years, quit in 2012) Neg CXR 12/2020; never had low dose CT; discussed but declines at this time Agrees to CXR- ordered today  OSA Has been followed by Dr. Brett Fairy but has declined another sleep study  Medication Management Continued  Decreased appetite Advised to start glucerna shakes at least once a day Strongly encouraged to try to eat at least 3 times a day, even if only small amounts Monitor  Further disposition pending results if labs check today. Discussed med's effects and SE's.   Over 30 minutes of face to face interview, exam, counseling, chart review, and critical decision making was performed.    Future Appointments  Date Time Provider [place]  05/06/2023  3:00 PM [name], MD [facility] None  12/06/2023 11:00 AM Alycia Rossetti, NP [facility] None     Plan:   During the course of the visit the patient was educated and counseled about appropriate screening and preventive services including:   Pneumococcal vaccine  Influenza vaccine Prevnar 13 Td vaccine Screening electrocardiogram Colorectal cancer screening Diabetes screening Glaucoma screening Nutrition counseling    Subjective:  Michael Guzman is a 77 y.o. male who presents accompanied by his wife for Medicare Annual Wellness Visit and 3 month follow up for HTN, HLD, T2 diabetes, and vitamin D Def.   He has hx of ASCAD s/p PTCA (06/2012), last stress test 10/2018 was normal obtained for surgical clearance, followed by Dr. Marlou Porch;  Former smoker, 37.5 pack year history, quit in 2012; last CXR 12/2020 was normal  hx of prostate cancer treated by radiation in  2016-2017; he is followed by Dr. Eskridge q71m, last visit 03/2020  Some concern of ongoing depression; currently treated by wellbutrin 300 mg daily.Marland Kitchen His wife died  Patient states he is not depressed. He reports otherwise his mood is fine.   He feels like he is having more trouble with swallowing meat.  Swallowing pills without difficulty.  BMI is Body mass index is 28.96 kg/m., he has been working on diet.  He does not feel hungry since his wife died. States he is too lazy to do the cooking  He does not like doing the dishes. Has lost 17 pounds in the past year Wt Readings from Last 3 Encounters:  12/04/22 201 lb 12.8 oz (91.5 kg)  09/21/22 211 lb 3.2 oz (95.8 kg)  09/01/22 213 lb 9.6 oz (96.9 kg)   His blood pressure has been controlled at home, he is on toprol XL 12.5 mg daily only, day their BP is BP: 122/80  BP Readings from Last 3 Encounters:  12/04/22 122/80  09/21/22 (!) 154/93  09/01/22 118/72  He does workout. He denies chest pain, shortness of breath, dizziness.   Dr. Brett Fairy is following for sleep apnea. He was supposed to have another sleep study but has declined so far.  He was previously using CPAP but insurance took away when he wasn't using when his wife was sick.   He falls asleep early around 8 pm and awakens at 2 am.  Can usually go back to sleep.    He is on cholesterol medication Atorvastatin 10 mg daily and denies myalgias. His cholesterol is at goal. The cholesterol last visit was:   Lab Results  Component Value Date   CHOL 114 08/03/2022   HDL 43 08/03/2022   LDLCALC 42 08/03/2022   LDLDIRECT 45.7 10/30/2013   TRIG 217 (H) 08/03/2022   CHOLHDL 2.7 08/03/2022   He has been working on diet and exercise for Diabetes with diabetic chronic kidney disease, with other circulatory complications and with diabetic polyneuropathy , he is on bASA, he is not on ACE/ARB due to low BPS, sugars in the AM are 120's. In the afternoon his BS are running 90-140. He is to take  Humulin 70/30 50 units BID but has not been taking regularly- believes only 2 times this past month- only takes if blood sugar is greater 140 Also on metformin 500 mg 2 tabs bid. He denies hypoglycemia , polydipsia and polyuria. He has gabapentin prescribed for intermittent LE paresthesias in feet, takes 800 mg QID PRN, none in the last month.  Last A1C was:  Lab Results  Component Value Date   HGBA1C 7.0 (H) 08/03/2022   He tried to drink more water.  Lab Results  Component Value Date   EGFR 58 (L) 08/03/2022    He has a history of thyroiditis, TSHs have been monitored closely and remained in normal range.   Lab Results  Component Value Date   TSH 1.09 08/03/2022  . Patient is on Vitamin D supplement.  Currently on 10000 units daily Lab Results  Component Value Date   VD25OH 53 04/30/2022       Medication Review: Current Outpatient Medications on File Prior to Visit  Medication Sig Dispense Refill   Ascorbic Acid (VITAMIN C) 1000 MG tablet Take 1,000 mg by mouth daily.     aspirin EC 81  MG tablet Take 1 tablet (81 mg total) by mouth daily. 90 tablet 3   atorvastatin (LIPITOR) 10 MG tablet TAKE 1 TABLET BY MOUTH DAILY 100 tablet 2   B Complex-C (SUPER B COMPLEX PO) Take 1 tablet by mouth daily.     buPROPion (WELLBUTRIN XL) 300 MG 24 hr tablet Take  1 tablet  Daily for Mood , Focus & Concentration                              /                     TAKE                        BY                            MOUTH 90 tablet 3   Cholecalciferol (VITAMIN D3) 25 MCG (1000 UT) CAPS Take 1,000 Units by mouth daily.     furosemide (LASIX) 40 MG tablet Take 1 tablet 2 x /day as needed  for Fluid Retention (Patient taking differently: Take 20 mg by mouth 2 (two) times daily as needed for fluid or edema (Fluid Retention).) 180 tablet 3   gabapentin (NEURONTIN) 800 MG tablet TAKE 1 TABLET BY MOUTH 3 TO 4  TIMES DAILY FOR PAIN, HOT  FLASHES AND SWEATS (Patient taking differently: Take 1,600 mg by  mouth 2 (two) times daily.  FOR PAIN, HOT FLASHES AND SWEATS) 360 tablet 3   glucose blood (ONETOUCH ULTRA) test strip CHECK BLOOD SUGAR 3 TIMES  DAILY 300 strip 3   glycopyrrolate (ROBINUL) 2 MG tablet Take  1 tablet  3 x /day  for Excessive Sweating (Patient taking differently: Take 2 mg by mouth 2 (two) times daily.  for Excessive Sweating) 90 tablet 11   HUMULIN 70/30 (70-30) 100 UNIT/ML injection INJECT SUBCUTANEOUSLY 50 UNITS  TWICE DAILY (Patient taking differently: Inject 40 Units into the skin 2 (two) times daily with a meal.) 100 mL 2   Insulin Syringes, Disposable, U-100 1 ML MISC 50 Units by Does not apply route 2 (two) times daily. inject 50 units into skin 2 x /day 200 each 3   Lancets (ONETOUCH DELICA PLUS 123XX123) MISC CHECK BLOOD SUGAR 3 TIMES  DAILY 300 each 3   latanoprost (XALATAN) 0.005 % ophthalmic solution Place 1 drop into both eyes at bedtime.     Magnesium 500 MG TABS Take 500 mg by mouth daily.     metFORMIN (GLUCOPHAGE-XR) 500 MG 24 hr tablet TAKE 2 TABLETS BY MOUTH TWICE  DAILY WITH MEALS FOR DIABETES 360 tablet 3   metoprolol succinate (TOPROL-XL) 25 MG 24 hr tablet TAKE ONE-HALF TABLET BY MOUTH  DAILY 50 tablet 3   montelukast (SINGULAIR) 10 MG tablet TAKE 1 TABLET BY MOUTH DAILY FOR ALLERGIES 90 tablet 3   Multiple Vitamin (MULTIVITAMIN WITH MINERALS) TABS tablet Take 1 tablet by mouth daily. men     Multiple Vitamins-Minerals (ZINC PO) Take 50 mg by mouth daily.     nitroGLYCERIN (NITROSTAT) 0.4 MG SL tablet PLACE 1 TABLET UNDER THE TONGUE EVERY 5 MINUTES AS NEEDED FOR CHEST PAIN 25 tablet 6   Omega-3 Fatty Acids (FISH OIL) 1200 MG CAPS Take 1,200 mg by mouth daily.     ondansetron (ZOFRAN) 8 MG tablet Take 1 tablet (  8 mg total) by mouth every 8 (eight) hours as needed for nausea or vomiting. 6 tablet 0   pantoprazole (PROTONIX) 40 MG tablet TAKE 1 TABLET BY MOUTH  DAILY TO PREVENT HEARTBURN  AND INDIGESTION 90 tablet 3   Polyethyl Glycol-Propyl Glycol (SYSTANE)  0.4-0.3 % SOLN Place 2 drops into both eyes daily as needed (for dry eyes).     tamsulosin (FLOMAX) 0.4 MG CAPS capsule Take 0.4 mg by mouth at bedtime.     Vitamin E 400 units TABS Take 400 Units by mouth daily.     Blood Glucose Monitoring Suppl (ONE TOUCH ULTRA 2) w/Device KIT Use as directed 1 kit 0   docusate sodium (COLACE) 100 MG capsule Take 1 capsule (100 mg total) by mouth 2 (two) times daily. (Patient not taking: Reported on 09/17/2022) 30 capsule 0   No current facility-administered medications on file prior to visit.    Allergies: No Known Allergies  Current Problems (verified) has Hypertension (1991); GERD; ASCAD s/p PTCA (06/2012); Hyperlipidemia associated with type 2 diabetes mellitus ([place]); Vitamin D deficiency; Medication management; Diabetic peripheral neuropathy ([place]); Malignant neoplasm of prostate ([place]); Thyroiditis; Obesity (BMI 30.0-34.9); Type 2 diabetes mellitus with vascular disease ([place]); Aortic atherosclerosis ([place]) by Abd CT scan 2015; Microalbuminuric diabetic nephropathy ([place]); Hypertriglyceridemia; Fatty infiltration of liver; Former smoker; Primary osteoarthritis of hip; AMS (altered mental status); Hypoglycemia due to insulin; Acute encephalopathy; Hypothermia; Diabetes mellitus with coincident hypertension ([place]); Diaphoresis; Inadequate sleep hygiene; Hypersomnia with sleep apnea; Excessive daytime sleepiness; Excessive postexertional fatigue; Adenomatous polyp of transverse colon; Hx of adenomatous colonic polyps; Post colonoscopy pain nausea and vomiting; and Abnormal colonoscopy on their problem list.  Screening Tests Immunization History  Administered Date(s) Administered   Influenza Split 06/26/2012   Influenza, High Dose Seasonal PF 06/20/2015, 06/23/2016, 06/14/2017, 06/14/2019, 07/01/2020, 07/21/2021, 08/03/2022   Influenza-Unspecified 06/14/2013, 06/25/2014, 06/03/2018   PFIZER(Purple Top)SARS-COV-2 Vaccination 10/28/2019, 11/22/2019, 06/26/2020    Pneumococcal Conjugate-13 04/23/2016   Pneumococcal Polysaccharide-23 06/14/2017   Pneumococcal-Unspecified 06/16/2010   Td 09/17/2003   Tdap 11/26/2013   Zoster, Live 07/12/2012    Health Maintenance  Topic Date Due   COVID-19 Vaccine (4 - 2023-24 season) 12/20/2022 (Originally 05/15/2022)   Zoster Vaccines- Shingrix (1 of 2) 03/06/2023 (Originally 06/02/1965)   HEMOGLOBIN A1C  02/01/2023   OPHTHALMOLOGY EXAM  06/24/2023   Diabetic kidney evaluation - eGFR measurement  08/04/2023   FOOT EXAM  08/04/2023   Diabetic kidney evaluation - Urine ACR  09/02/2023   DTaP/Tdap/Td (3 - Td or Tdap) 11/27/2023   Medicare Annual Wellness (AWV)  12/04/2023   COLONOSCOPY (Pts 45-13yrs Insurance coverage will need to be confirmed)  09/21/2032   Pneumonia Vaccine 24+ Years old  Completed   INFLUENZA VACCINE  Completed   Hepatitis C Screening  Completed   HPV VACCINES  Aged Out   Lung Cancer Screening  Discontinued    Names of Other Physician/Practitioners you currently use: 1. [facility] here for primary care 2. Dr. Gershon Crane, eye doctor, last visit 09/2022, mild glaucoma uses Latanoprost in both eyes 3. [facility], dentist, last visit 06/2022  Patient Care Team: [name], MD as PCP - General (Internal Medicine) Jerline Pain, MD as PCP - Cardiology (Cardiology) Carleene Mains, [facility] as Pharmacist (Pharmacist)  Surgical: He  has a past surgical history that includes Appendectomy; Cardiac surgery (~2017); left heart catheterization with coronary angiogram (N/A, 06/27/2012); left heart catheterization with coronary angiogram (N/A, 07/25/2012); Colonoscopy (02/12/2010); Radiology with anesthesia (  N/A, 12/03/2015); Prostate biopsy; Total hip arthroplasty (Right, 04/28/2019); Colonoscopy with propofol (N/A, 09/21/2022); Endoscopic mucosal resection (N/A, 09/21/2022); Submucosal lifting injection (09/21/2022); and Hemostasis clip placement  (09/21/2022). Family His family history includes Diabetes in his sister. Social history  He reports that he quit smoking about 11 years ago. His smoking use included cigarettes. He started smoking about 64 years ago. He has a 37.50 pack-year smoking history. He has never used smokeless tobacco. He reports that he does not currently use alcohol. He reports that he does not use drugs.  MEDICARE WELLNESS OBJECTIVES: Physical activity: Current Exercise Habits: Home exercise routine, Type of exercise: walking, Time (Minutes): 20, Frequency (Times/Week): 3, Weekly Exercise (Minutes/Week): 60, Intensity: Mild, Exercise limited by: neurologic condition(s);orthopedic condition(s) Cardiac risk factors: Cardiac Risk Factors include: advanced age (>1men, >42 women);diabetes mellitus;dyslipidemia;hypertension;male gender;sedentary lifestyle Depression/mood screen:      12/04/2022   11:14 AM  Depression screen PHQ 2/9  Decreased Interest 1  Down, Depressed, Hopeless 0  PHQ - 2 Score 1    ADLs:     12/04/2022   11:16 AM 08/03/2022    9:49 AM  In your present state of health, do you have any difficulty performing the following activities:  Hearing? 0 0  Vision? 0 0  Difficulty concentrating or making decisions? 0 0  Walking or climbing stairs? 1 0  Dressing or bathing? 0 0  Doing errands, shopping? 0 0     Cognitive Testing  Alert? Yes  Normal Appearance?Yes  Oriented to person? Yes  Place? Yes   Time? Yes  Recall of three objects?  Yes  Can perform simple calculations? Yes  Displays appropriate judgment?Yes  Can read the correct time from a watch face?Yes  EOL planning: Does Patient Have a Medical Advance Directive?: Yes Type of Advance Directive: Living will Does patient want to make changes to medical advance directive?: No - Patient declined  Review of Systems  Constitutional:  Negative for chills, fever and weight loss.  HENT:  Positive for tinnitus. Negative for congestion and  hearing loss.   Eyes:  Negative for blurred vision and double vision.  Respiratory:  Negative for cough and shortness of breath.   Cardiovascular:  Negative for chest pain, palpitations, orthopnea and leg swelling.  Gastrointestinal:  Negative for abdominal pain, constipation, diarrhea, heartburn, nausea and vomiting.       Decreased appetite  Genitourinary:  Negative for dysuria.  Musculoskeletal:  Negative for falls, joint pain and myalgias.  Skin:  Negative for rash.  Neurological:  Positive for tingling (burning pain in feet). Negative for dizziness, tremors, loss of consciousness and headaches.  Endo/Heme/Allergies:  Does not bruise/bleed easily.  Psychiatric/Behavioral:  Negative for depression, memory loss and suicidal ideas.      Objective:   Today's Vitals   12/04/22 1055  BP: 122/80  Pulse: 66  Temp: (!) 97.3 F (36.3 C)  SpO2: 97%  Weight: 201 lb 12.8 oz (91.5 kg)  Height: 5\' 10"  (1.778 m)    Body mass index is 28.96 kg/m.  General appearance: alert, no distress, WD/WN, male HEENT: normocephalic, sclerae anicteric, TMs pearly, nares patent, no discharge or erythema, pharynx normal Oral cavity: MMM, no lesions Neck: supple, no lymphadenopathy, no thyromegaly, no masses Heart: RRR, normal S1, S2, no murmurs Lungs: CTA bilaterally, no wheezes, rhonchi, or rales Abdomen: +bs, soft, non tender, non distended, no masses, no hepatomegaly, no splenomegaly Musculoskeletal: nontender, no swelling, no obvious deformity; slow sitting to standing, slow steady gait  Extremities: no  edema, no cyanosis, no clubbing Pulses: 2+ symmetric, upper and lower extremities, normal cap refill Neurological: alert, oriented x 3, CN2-12 intact, strength normal upper extremities and lower extremities, sensation was normal bilateral feet to monofilament, DTRs 2+ throughout, no cerebellar signs Psychiatric: flat affect, behavior normal  Medicare Attestation I have personally reviewed: The  patient's medical and social history Their use of alcohol, tobacco or illicit drugs Their current medications and supplements The patient's functional ability including ADLs,fall risks, home safety risks, cognitive, and hearing and visual impairment Diet and physical activities Evidence for depression or mood disorders  The patient's weight, height, BMI, and visual acuity have been recorded in the chart.  I have made referrals, counseling, and provided education to the patient based on review of the above and I have provided the patient with a written personalized care plan for preventive services.      [facility]  12/04/2022

## 2022-12-04 NOTE — Patient Instructions (Signed)

## 2022-12-05 LAB — CBC WITH DIFFERENTIAL/PLATELET
Absolute Monocytes: 565 cells/uL (ref 200–950)
Basophils Absolute: 40 cells/uL (ref 0–200)
Basophils Relative: 0.8 %
Eosinophils Absolute: 140 cells/uL (ref 15–500)
Eosinophils Relative: 2.8 %
HCT: 37.7 % — ABNORMAL LOW (ref 38.5–50.0)
Hemoglobin: 12.6 g/dL — ABNORMAL LOW (ref 13.2–17.1)
Lymphs Abs: 1625 cells/uL (ref 850–3900)
MCH: 29.5 pg (ref 27.0–33.0)
MCHC: 33.4 g/dL (ref 32.0–36.0)
MCV: 88.3 fL (ref 80.0–100.0)
MPV: 11.4 fL (ref 7.5–12.5)
Monocytes Relative: 11.3 %
Neutro Abs: 2630 cells/uL (ref 1500–7800)
Neutrophils Relative %: 52.6 %
Platelets: 199 10*3/uL (ref 140–400)
RBC: 4.27 10*6/uL (ref 4.20–5.80)
RDW: 12.7 % (ref 11.0–15.0)
Total Lymphocyte: 32.5 %
WBC: 5 10*3/uL (ref 3.8–10.8)

## 2022-12-05 LAB — COMPLETE METABOLIC PANEL WITH GFR
AG Ratio: 2 (calc) (ref 1.0–2.5)
ALT: 22 U/L (ref 9–46)
AST: 20 U/L (ref 10–35)
Albumin: 4.5 g/dL (ref 3.6–5.1)
Alkaline phosphatase (APISO): 56 U/L (ref 35–144)
BUN: 11 mg/dL (ref 7–25)
CO2: 24 mmol/L (ref 20–32)
Calcium: 9.7 mg/dL (ref 8.6–10.3)
Chloride: 104 mmol/L (ref 98–110)
Creat: 1.05 mg/dL (ref 0.70–1.28)
Globulin: 2.3 g/dL (calc) (ref 1.9–3.7)
Glucose, Bld: 160 mg/dL — ABNORMAL HIGH (ref 65–99)
Potassium: 4.5 mmol/L (ref 3.5–5.3)
Sodium: 138 mmol/L (ref 135–146)
Total Bilirubin: 0.3 mg/dL (ref 0.2–1.2)
Total Protein: 6.8 g/dL (ref 6.1–8.1)
eGFR: 74 mL/min/{1.73_m2} (ref >=60)

## 2022-12-05 LAB — LIPID PANEL
Cholesterol: 115 mg/dL (ref <200)
HDL: 56 mg/dL (ref >=40)
LDL Cholesterol (Calc): 36 mg/dL (calc)
Non-HDL Cholesterol (Calc): 59 mg/dL (calc) (ref <130)
Total CHOL/HDL Ratio: 2.1 (calc) (ref <5.0)
Triglycerides: 145 mg/dL (ref <150)

## 2022-12-05 LAB — TSH: TSH: 0.93 mIU/L (ref 0.40–4.50)

## 2022-12-05 LAB — HEMOGLOBIN A1C
Hgb A1c MFr Bld: 7.1 % of total Hgb — ABNORMAL HIGH (ref <5.7)
Mean Plasma Glucose: 157 mg/dL
eAG (mmol/L): 8.7 mmol/L

## 2022-12-07 NOTE — Progress Notes (Signed)
Patient is aware of his lab results and Humulin instructions. -e welch

## 2022-12-24 ENCOUNTER — Other Ambulatory Visit: Payer: Self-pay | Admitting: Internal Medicine

## 2022-12-24 DIAGNOSIS — R61 Generalized hyperhidrosis: Secondary | ICD-10-CM

## 2022-12-29 ENCOUNTER — Telehealth: Payer: Self-pay | Admitting: Pharmacist

## 2022-12-29 DIAGNOSIS — H401131 Primary open-angle glaucoma, bilateral, mild stage: Secondary | ICD-10-CM | POA: Diagnosis not present

## 2022-12-29 NOTE — Progress Notes (Signed)
HC calling patient to get initial visit rescheduled from 08/18/22 and then again on 10/27/22. LVMTRC.  Time: ( )  01/07/23- Will try to connect with patient at the end of July to reschedule initial, due to no CP available until that time. BP

## 2023-02-03 ENCOUNTER — Ambulatory Visit: Payer: Medicare Other | Admitting: Nurse Practitioner

## 2023-02-18 DIAGNOSIS — H2513 Age-related nuclear cataract, bilateral: Secondary | ICD-10-CM | POA: Diagnosis not present

## 2023-02-18 DIAGNOSIS — H40013 Open angle with borderline findings, low risk, bilateral: Secondary | ICD-10-CM | POA: Diagnosis not present

## 2023-03-29 DIAGNOSIS — M47816 Spondylosis without myelopathy or radiculopathy, lumbar region: Secondary | ICD-10-CM | POA: Diagnosis not present

## 2023-04-05 ENCOUNTER — Other Ambulatory Visit: Payer: Self-pay | Admitting: Internal Medicine

## 2023-04-05 ENCOUNTER — Other Ambulatory Visit: Payer: Self-pay | Admitting: Nurse Practitioner

## 2023-04-05 DIAGNOSIS — M159 Polyosteoarthritis, unspecified: Secondary | ICD-10-CM

## 2023-04-05 DIAGNOSIS — M150 Primary generalized (osteo)arthritis: Secondary | ICD-10-CM

## 2023-04-05 DIAGNOSIS — R61 Generalized hyperhidrosis: Secondary | ICD-10-CM

## 2023-04-14 DIAGNOSIS — M5416 Radiculopathy, lumbar region: Secondary | ICD-10-CM | POA: Diagnosis not present

## 2023-05-06 ENCOUNTER — Encounter: Payer: Self-pay | Admitting: Internal Medicine

## 2023-05-06 ENCOUNTER — Ambulatory Visit (INDEPENDENT_AMBULATORY_CARE_PROVIDER_SITE_OTHER): Payer: Medicare Other | Admitting: Internal Medicine

## 2023-05-06 VITALS — BP 122/80 | HR 78 | Temp 97.9°F | Resp 17 | Ht 70.0 in | Wt 200.0 lb

## 2023-05-06 DIAGNOSIS — Z136 Encounter for screening for cardiovascular disorders: Secondary | ICD-10-CM

## 2023-05-06 DIAGNOSIS — Z8546 Personal history of malignant neoplasm of prostate: Secondary | ICD-10-CM

## 2023-05-06 DIAGNOSIS — Z87891 Personal history of nicotine dependence: Secondary | ICD-10-CM

## 2023-05-06 DIAGNOSIS — E1169 Type 2 diabetes mellitus with other specified complication: Secondary | ICD-10-CM | POA: Diagnosis not present

## 2023-05-06 DIAGNOSIS — I2510 Atherosclerotic heart disease of native coronary artery without angina pectoris: Secondary | ICD-10-CM

## 2023-05-06 DIAGNOSIS — Z79899 Other long term (current) drug therapy: Secondary | ICD-10-CM | POA: Diagnosis not present

## 2023-05-06 DIAGNOSIS — E559 Vitamin D deficiency, unspecified: Secondary | ICD-10-CM

## 2023-05-06 DIAGNOSIS — I10 Essential (primary) hypertension: Secondary | ICD-10-CM

## 2023-05-06 DIAGNOSIS — E1122 Type 2 diabetes mellitus with diabetic chronic kidney disease: Secondary | ICD-10-CM

## 2023-05-06 DIAGNOSIS — Z8249 Family history of ischemic heart disease and other diseases of the circulatory system: Secondary | ICD-10-CM

## 2023-05-06 DIAGNOSIS — K219 Gastro-esophageal reflux disease without esophagitis: Secondary | ICD-10-CM

## 2023-05-06 DIAGNOSIS — Z1211 Encounter for screening for malignant neoplasm of colon: Secondary | ICD-10-CM

## 2023-05-06 DIAGNOSIS — I700 Atherosclerosis of aorta: Secondary | ICD-10-CM | POA: Diagnosis not present

## 2023-05-06 DIAGNOSIS — Z0000 Encounter for general adult medical examination without abnormal findings: Secondary | ICD-10-CM

## 2023-05-06 DIAGNOSIS — G4733 Obstructive sleep apnea (adult) (pediatric): Secondary | ICD-10-CM

## 2023-05-06 DIAGNOSIS — Z0001 Encounter for general adult medical examination with abnormal findings: Secondary | ICD-10-CM

## 2023-05-06 DIAGNOSIS — N401 Enlarged prostate with lower urinary tract symptoms: Secondary | ICD-10-CM

## 2023-05-06 DIAGNOSIS — N182 Chronic kidney disease, stage 2 (mild): Secondary | ICD-10-CM | POA: Diagnosis not present

## 2023-05-06 DIAGNOSIS — E119 Type 2 diabetes mellitus without complications: Secondary | ICD-10-CM

## 2023-05-06 NOTE — Patient Instructions (Signed)

## 2023-05-06 NOTE — Progress Notes (Signed)
Annual  Screening/Preventative Visit  & Comprehensive Evaluation & Examination   Future Appointments  Date Time Provider Department  05/06/2023                                cpe  3:00 PM Lucky Cowboy, MD [facility]  12/06/2023                                wellness 11:00 AM Raynelle Dick, NP [facility]  05/16/2024                                  cpe  3:00 PM Lucky Cowboy, MD [facility]            This very nice 77 y.o. recently widowed BM  with HTN, HLD, T2_IDDM  and Vitamin D Deficiency  presents for a Screening /Preventative Visit & comprehensive evaluation and management of multiple medical co-morbidities.   Abd CT scan in 2015 showed Aortic Atherosclerosis. Patient has GERD controlled on his meds.  Patient has hx/o Prostate Ca treated by XRT in 2018 & followed by active surveillance.  In Nov 2022, Dr Dohmieir Dx'd OSA & recc auto CPAP.         HTN predates since 5. Patient's BP has been controlled at home.  Today's BP is at goal - 122/80 .   Patient has ASCAD with PCA & Stent placed in 2013 and is followed by Dr Anne Fu.  Stress Lexiscan in Feb 2020 was Normal.  Patient denies any cardiac symptoms as chest pain, palpitations, shortness of breath, dizziness or ankle swelling.       Patient's hyperlipidemia is controlled with diet and medications. Patient denies myalgias or other medication SE's. Last lipids were at goal :  Lab Results  Component Value Date   CHOL 115 12/04/2022   HDL 56 12/04/2022   LDLCALC 36 12/04/2022   LDLDIRECT 45.7 10/30/2013   TRIG 145 12/04/2022   CHOLHDL 2.1 12/04/2022         Patient has hx/o T2_IDDM (1998) w/CKD2 (GFR 6) and  was treated with Metformin til transitioned to Insulin (2016). patient denies reactive hypoglycemic symptoms, visual blurring, diabetic polys or paresthesias. Last A1c was not at goal :   Lab Results  Component Value Date   HGBA1C 7.1 (H) 12/04/2022         Finally, patient has history of Vitamin D  Deficiency ("15" /2008)  and last vitamin D was normal :   Lab Results  Component Value Date   VD25OH 53 04/30/2022       Current Outpatient Medications:    Ascorbic Acid (VITAMIN C) 1000 MG tablet, Take 1,000 mg by mouth daily., Disp: , Rfl:    aspirin EC 81 MG tablet, Take 1 tablet (81 mg total) by mouth daily., Disp: 90 tablet, Rfl: 3   atorvastatin (LIPITOR) 10 MG tablet, TAKE 1 TABLET BY MOUTH DAILY, Disp: 100 tablet, Rfl: 2   B Complex-C (SUPER B COMPLEX PO), Take 1 tablet by mouth daily., Disp: , Rfl:    Blood Glucose Monitoring Suppl (ONE TOUCH ULTRA 2) w/Device KIT, Use as directed, Disp: 1 kit, Rfl: 0   buPROPion (WELLBUTRIN XL) 300 MG 24 hr tablet, Take  1 tablet  Daily for Mood , Focus & Concentration                              /  TAKE                        BY                            MOUTH, Disp: 90 tablet, Rfl: 3   Cholecalciferol (VITAMIN D3) 25 MCG (1000 UT) CAPS, Take 1,000 Units by mouth daily., Disp: , Rfl:    docusate sodium (COLACE) 100 MG capsule, Take 1 capsule (100 mg total) by mouth 2 (two) times daily. (Patient not taking: Reported on 09/17/2022), Disp: 30 capsule, Rfl: 0   furosemide (LASIX) 40 MG tablet, Take 1 tablet 2 x /day as needed  for Fluid Retention (Patient taking differently: Take 20 mg by mouth 2 (two) times daily as needed for fluid or edema (Fluid Retention).), Disp: 180 tablet, Rfl: 3   gabapentin (NEURONTIN) 800 MG tablet, TAKE 1 TABLET BY MOUTH 3 TO 4  TIMES DAILY FOR PAIN, HOT  FLASHES AND SWEATS, Disp: 400 tablet, Rfl: 2   glucose blood (ONETOUCH ULTRA) test strip, CHECK BLOOD SUGAR 3 TIMES  DAILY, Disp: 300 strip, Rfl: 3   glycopyrrolate (ROBINUL) 2 MG tablet, Take  1 tablet  3 x / day for Excessive Sweating  TAKE ONE TABLET BY                                                 MOUTH, Disp: 270 tablet, Rfl: 3   HUMULIN 70/30 (70-30) 100 UNIT/ML injection, INJECT SUBCUTANEOUSLY 50 UNITS  TWICE DAILY (Patient taking differently:  Inject 40 Units into the skin 2 (two) times daily with a meal.), Disp: 100 mL, Rfl: 2   Insulin Syringes, Disposable, U-100 1 ML MISC, 50 Units by Does not apply route 2 (two) times daily. inject 50 units into skin 2 x /day, Disp: 200 each, Rfl: 3   Lancets (ONETOUCH DELICA PLUS LANCET33G) MISC, CHECK BLOOD SUGAR 3 TIMES  DAILY, Disp: 300 each, Rfl: 3   latanoprost (XALATAN) 0.005 % ophthalmic solution, Place 1 drop into both eyes at bedtime., Disp: , Rfl:    Magnesium 500 MG TABS, Take 500 mg by mouth daily., Disp: , Rfl:    metFORMIN (GLUCOPHAGE-XR) 500 MG 24 hr tablet, TAKE 2 TABLETS BY MOUTH TWICE  DAILY WITH MEALS FOR DIABETES, Disp: 360 tablet, Rfl: 3   metoprolol succinate (TOPROL-XL) 25 MG 24 hr tablet, TAKE ONE-HALF TABLET BY MOUTH  DAILY, Disp: 50 tablet, Rfl: 3   montelukast (SINGULAIR) 10 MG tablet, TAKE 1 TABLET BY MOUTH DAILY FOR ALLERGIES, Disp: 100 tablet, Rfl: 2   Multiple Vitamin (MULTIVITAMIN WITH MINERALS) TABS tablet, Take 1 tablet by mouth daily. men, Disp: , Rfl:    Multiple Vitamins-Minerals (ZINC PO), Take 50 mg by mouth daily., Disp: , Rfl:    nitroGLYCERIN (NITROSTAT) 0.4 MG SL tablet, PLACE 1 TABLET UNDER THE TONGUE EVERY 5 MINUTES AS NEEDED FOR CHEST PAIN, Disp: 25 tablet, Rfl: 6   Omega-3 Fatty Acids (FISH OIL) 1200 MG CAPS, Take 1,200 mg by mouth daily., Disp: , Rfl:    ondansetron (ZOFRAN) 8 MG tablet, Take 1 tablet (8 mg total) by mouth every 8 (eight) hours as needed for nausea or vomiting., Disp: 6 tablet, Rfl: 0   pantoprazole (PROTONIX) 40 MG tablet, TAKE 1 TABLET BY MOUTH  DAILY TO  PREVENT HEARTBURN AND  INDIGESTION, Disp: 100 tablet, Rfl: 2   Polyethyl Glycol-Propyl Glycol (SYSTANE) 0.4-0.3 % SOLN, Place 2 drops into both eyes daily as needed (for dry eyes)., Disp: , Rfl:    tamsulosin (FLOMAX) 0.4 MG CAPS capsule, Take 0.4 mg by mouth at bedtime., Disp: , Rfl:    Vitamin E 400 units TABS, Take 400 Units by mouth daily., Disp: , Rfl:    No Known  Allergies   Past Medical History:  Diagnosis Date   Arthritis    CAD (coronary artery disease)    a.s/p DES to mid LAD and OM2 06/2012.   Chronic back pain    Chronically dry eyes    Diabetes mellitus without complication (HCC)    TYPE 2    DKA (diabetic ketoacidoses) 01/30/2017   Elevated PSA    being monitored   Fatty liver    GERD (gastroesophageal reflux disease)    Hx of radiation therapy    prostate , [facility]    Hypertension    Hypertriglyceridemia    Intermediate coronary syndrome (HCC) 07/26/2012   Prostate cancer (HCC)    RADTIATION      Health Maintenance  Topic Date Due   COVID-19 Vaccine (1) Never done   Zoster Vaccines- Shingrix (1 of 2) Never done   COLONOSCOPY  02/13/2020   FOOT EXAM  04/10/2021   URINE MICROALBUMIN  04/11/2021   INFLUENZA VACCINE  04/14/2021   HEMOGLOBIN A1C  04/29/2021   OPHTHALMOLOGY EXAM  06/03/2021   TETANUS/TDAP  11/27/2023   Hepatitis C Screening  Completed   PNA vac Low Risk Adult  Completed   HPV VACCINES  Aged Out     Immunization History  Administered Date(s) Administered   Influenza Split 06/26/2012   Influenza, High Dose  06/14/2017, 06/14/2019, 07/01/2020   Influenza 06/14/2013, 06/25/2014, 06/03/2018   Pneumococcal -13 04/23/2016   Pneumococcal -23 06/14/2017   Pneumococcal -23 06/16/2010   Td 09/17/2003   Tdap 11/26/2013   Zoster, Live 07/12/2012    Last Colon -  02/12/2010-  Dr Joya San recc 10 yr f/u due June 2021 (patient aware overdue).   Past Surgical History:  Procedure Laterality Date   APPENDECTOMY     CARDIAC SURGERY  ~2017   3 stent placed.   COLONOSCOPY  02/12/2010   Buccini   LEFT HEART CATHETERIZATION WITH CORONARY ANGIOGRAM N/A 06/27/2012   Procedure: LEFT HEART CATHETERIZATION WITH CORONARY ANGIOGRAM;  Surgeon: Donato Schultz, MD;  Location: [facility] CATH LAB;  Service: Cardiovascular;  Laterality: N/A;   LEFT HEART CATHETERIZATION WITH CORONARY ANGIOGRAM N/A 07/25/2012    Procedure: LEFT HEART CATHETERIZATION WITH CORONARY ANGIOGRAM;  Surgeon: Quintella Reichert, MD;  Location: [facility] CATH LAB;  Service: Cardiovascular;  Laterality: N/A;   PROSTATE BIOPSY     RADIOLOGY WITH ANESTHESIA N/A 12/03/2015   Procedure: MRI LUMBAR SPINE;  Surgeon: Medication Radiologist, MD;  Location: [facility] OR;  Service: Radiology;  Laterality: N/A;   TOTAL HIP ARTHROPLASTY Right 04/28/2019   Procedure: TOTAL HIP ARTHROPLASTY ANTERIOR APPROACH;  Surgeon: Jodi Geralds, MD;  Location: [facility] ORS;  Service: Orthopedics;  Laterality: Right;     Family History  Problem Relation Age of Onset   Diabetes Sister     Social History   Socioeconomic History   Marital status: Married    Spouse name: Not on file   Number of children: 4   Years of education: 2 y colleg   Highest education level: Not on file  Occupational History   Occupation: Event organiser: [employer]  Tobacco Use   Smoking status: Former    Packs/day: 0.75    Years: 50.00    Pack years: 37.50    Types: Cigarettes    Start date: 83    Quit date: 07/27/2011    Years since quitting: 9.7   Smokeless tobacco: Never  Substance and Sexual Activity   Alcohol use: Not Currently    Comment: qut 20 years ago per patient    Drug use: No   Sexual activity: Not on file  Other Topics Concern   Not on file    ROS Constitutional: Denies fever, chills, weight loss/gain, headaches, insomnia,  night sweats or change in appetite. Does c/o fatigue. Eyes: Denies redness, blurred vision, diplopia, discharge, itchy or watery eyes.  ENT: Denies discharge, congestion, post nasal drip, epistaxis, sore throat, earache, hearing loss, dental pain, Tinnitus, Vertigo, Sinus pain or snoring.  Cardio: Denies chest pain, palpitations, irregular heartbeat, syncope, dyspnea, diaphoresis, orthopnea, PND, claudication or edema Respiratory: denies cough, dyspnea, DOE, pleurisy, hoarseness, laryngitis or wheezing.  Gastrointestinal: Denies  dysphagia, heartburn, reflux, water brash, pain, cramps, nausea, vomiting, bloating, diarrhea, constipation, hematemesis, melena, hematochezia, jaundice or hemorrhoids Genitourinary: Denies dysuria, frequency,discharge, hematuria or flank pain. Has urgency, nocturia x 2-3 & occasional hesitancy. Musculoskeletal: Denies arthralgia, myalgia, stiffness, Jt. Swelling, pain, limp or strain/sprain. Denies Falls. Skin: Denies puritis, rash, hives, warts, acne, eczema or change in skin lesion Neuro: No weakness, tremor, incoordination, spasms, paresthesia or pain Psychiatric: Denies confusion, memory loss or sensory loss. Denies Depression. Endocrine: Denies change in weight, skin, hair change, nocturia, and paresthesia, diabetic polys, visual blurring or hyper / hypo glycemic episodes.  Heme/Lymph: No excessive bleeding, bruising or enlarged lymph nodes.   Physical Exam  BP 122/80   Pulse 78   Temp 97.9 F (36.6 C)   Resp 17   Ht 5\' 10"  (1.778 m)   Wt 200 lb (90.7 kg)   SpO2 99%   BMI 28.70 kg/m   General Appearance:  Over nourished and well groomed and in no apparent distress.  Eyes: PERRLA, EOMs, conjunctiva no swelling or erythema, normal fundi and vessels. Sinuses: No frontal/maxillary tenderness ENT/Mouth: EACs patent / TMs  nl. Nares clear without erythema, swelling, mucoid exudates. Oral hygiene is good. No erythema, swelling, or exudate.  Mallampati II. Tonsils not swollen or erythematous. Hearing normal.  Neck: Supple, thyroid not palpable. No bruits, nodes or JVD. Respiratory: Respiratory effort normal.  BS equal and clear bilateral without rales, rhonci, wheezing or stridor. Cardio: Heart sounds are normal with regular rate and rhythm and no murmurs, rubs or gallops. Peripheral pulses are normal and equal bilaterally without edema. No aortic or femoral bruits. Chest: symmetric with normal excursions and percussion.  Abdomen: Soft, with Nl bowel sounds. Nontender, no guarding,  rebound, hernias, masses, or organomegaly.  Lymphatics: Non tender without lymphadenopathy.  Musculoskeletal: Full ROM all peripheral extremities, joint stability, 5/5 strength, and normal gait. Skin: Warm and dry without rashes, lesions, cyanosis, clubbing or  ecchymosis.  Neuro: Cranial nerves intact, reflexes equal bilaterally. Normal muscle tone, no cerebellar symptoms. Sensation intact.  Pysch: Alert and oriented X 3 with normal affect, insight and judgment appropriate.   Assessment and Plan  1. Annual Preventative/Screening Exam    2. Essential hypertension  - EKG 12-Lead - [place], RETROPERITNL ABD,  LTD - Urinalysis, Routine w reflex microscopic - Microalbumin / creatinine urine ratio - CBC with Differential/Platelet - COMPLETE  METABOLIC PANEL WITH GFR - Magnesium - TSH  3. Hyperlipidemia associated with type 2 diabetes mellitus (HCC)  - EKG 12-Lead - [place], RETROPERITNL ABD,  LTD - Lipid panel - TSH  4. Type 2 diabetes mellitus with stage 2 chronic kidney  disease, with long-term current use of insulin (HCC)  - EKG 12-Lead - [place], RETROPERITNL ABD,  LTD - Urinalysis, Routine w reflex microscopic - Microalbumin / creatinine urine ratio - Hemoglobin A1c - Insulin, random  5. Vitamin D deficiency  - VITAMIN D 25 Hydroxy   6. OSA (obstructive sleep apnea)  - Ambulatory referral to Sleep Studies  7. Insulin-requiring or dependent type II diabetes mellitus (HCC)  - HM DIABETES FOOT EXAM - LOW EXTREMITY NEUR EXAM DOCUM  8. Aortic atherosclerosis (HCC) by Abd CT scan 2015  - EKG 12-Lead - [place], RETROPERITNL ABD,  LTD - Insulin, random  9. ASCAD s/p PTCA (06/2012)  - EKG 12-Lead  10. BPH with obstruction/lower urinary tract symptoms  - PSA  11. Gastroesophageal reflux disease  - CBC with Differential/Platelet  12. History of prostate cancer  - PSA  13. Screening for ischemic heart disease  - EKG 12-Lead  14. FHx: heart disease  - EKG 12-Lead - [place],  RETROPERITNL ABD,  LTD  15. Screening for colorectal cancer  - EKG 12-Lead - [place], RETROPERITNL ABD,  LTD - POC Hemoccult Bld/Stl   16. Former smoker  - EKG 12-Lead - [place], RETROPERITNL ABD,  LTD  17. Screening for AAA (aortic abdominal aneurysm)  - [place], RETROPERITNL ABD,  LTD  18. Medication management  - Urinalysis, Routine w reflex microscopic - Microalbumin / creatinine urine ratio - CBC with Differential/Platelet - COMPLETE METABOLIC PANEL WITH GFR - Magnesium - Lipid panel - TSH - Hemoglobin A1c - Insulin, random - VITAMIN D 25 Hydroxy          Patient was counseled in prudent diet, weight control to achieve/maintain BMI less than 25, BP monitoring, regular exercise and medications as discussed.  Discussed med effects and SE's. Routine screening labs and tests as requested with regular follow-up as recommended. Over 40 minutes of exam, counseling, chart review and high complex critical decision making was performed   Marinus Maw, MD

## 2023-05-07 LAB — COMPLETE METABOLIC PANEL WITH GFR
AG Ratio: 1.8 (calc) (ref 1.0–2.5)
ALT: 26 U/L (ref 9–46)
AST: 21 U/L (ref 10–35)
Albumin: 4.6 g/dL (ref 3.6–5.1)
Alkaline phosphatase (APISO): 64 U/L (ref 35–144)
BUN: 11 mg/dL (ref 7–25)
CO2: 26 mmol/L (ref 20–32)
Calcium: 10.2 mg/dL (ref 8.6–10.3)
Chloride: 98 mmol/L (ref 98–110)
Creat: 1.01 mg/dL (ref 0.70–1.28)
Globulin: 2.5 g/dL (ref 1.9–3.7)
Glucose, Bld: 133 mg/dL — ABNORMAL HIGH (ref 65–99)
Potassium: 4.6 mmol/L (ref 3.5–5.3)
Sodium: 132 mmol/L — ABNORMAL LOW (ref 135–146)
Total Bilirubin: 0.3 mg/dL (ref 0.2–1.2)
Total Protein: 7.1 g/dL (ref 6.1–8.1)
eGFR: 78 mL/min/{1.73_m2} (ref >=60)

## 2023-05-07 LAB — CBC WITH DIFFERENTIAL/PLATELET
Absolute Monocytes: 545 {cells}/uL (ref 200–950)
Basophils Absolute: 38 {cells}/uL (ref 0–200)
Basophils Relative: 0.8 %
Eosinophils Absolute: 127 {cells}/uL (ref 15–500)
Eosinophils Relative: 2.7 %
HCT: 36.4 % — ABNORMAL LOW (ref 38.5–50.0)
Hemoglobin: 12.7 g/dL — ABNORMAL LOW (ref 13.2–17.1)
Lymphs Abs: 1734 {cells}/uL (ref 850–3900)
MCH: 30.2 pg (ref 27.0–33.0)
MCHC: 34.9 g/dL (ref 32.0–36.0)
MCV: 86.5 fL (ref 80.0–100.0)
MPV: 11.2 fL (ref 7.5–12.5)
Monocytes Relative: 11.6 %
Neutro Abs: 2256 cells/uL (ref 1500–7800)
Neutrophils Relative %: 48 %
Platelets: 182 10*3/uL (ref 140–400)
RBC: 4.21 10*6/uL (ref 4.20–5.80)
RDW: 12.9 % (ref 11.0–15.0)
Total Lymphocyte: 36.9 %
WBC: 4.7 10*3/uL (ref 3.8–10.8)

## 2023-05-07 LAB — HEMOGLOBIN A1C
Hgb A1c MFr Bld: 7.6 %{Hb} — ABNORMAL HIGH (ref <5.7)
Mean Plasma Glucose: 171 mg/dL
eAG (mmol/L): 9.5 mmol/L

## 2023-05-07 LAB — LIPID PANEL
Cholesterol: 132 mg/dL (ref <200)
HDL: 53 mg/dL (ref >=40)
LDL Cholesterol (Calc): 47 mg/dL
Non-HDL Cholesterol (Calc): 79 mg/dL (ref <130)
Total CHOL/HDL Ratio: 2.5 (calc) (ref <5.0)
Triglycerides: 269 mg/dL — ABNORMAL HIGH (ref <150)

## 2023-05-07 LAB — URINALYSIS, ROUTINE W REFLEX MICROSCOPIC
Bilirubin Urine: NEGATIVE
Glucose, UA: NEGATIVE
Hgb urine dipstick: NEGATIVE
Leukocytes,Ua: NEGATIVE
Nitrite: NEGATIVE
Protein, ur: NEGATIVE
Specific Gravity, Urine: 1.022 (ref 1.001–1.035)
pH: 5.5 (ref 5.0–8.0)

## 2023-05-07 LAB — TSH: TSH: 0.82 m[IU]/L (ref 0.40–4.50)

## 2023-05-07 LAB — MICROALBUMIN / CREATININE URINE RATIO
Creatinine, Urine: 129 mg/dL (ref 20–320)
Microalb Creat Ratio: 3 mg/g{creat} (ref <30)
Microalb, Ur: 0.4 mg/dL

## 2023-05-07 LAB — MAGNESIUM: Magnesium: 1.8 mg/dL (ref 1.5–2.5)

## 2023-05-07 LAB — INSULIN, RANDOM: Insulin: 19.6 u[IU]/mL — ABNORMAL HIGH

## 2023-05-07 LAB — VITAMIN D 25 HYDROXY (VIT D DEFICIENCY, FRACTURES): Vit D, 25-Hydroxy: 54 ng/mL (ref 30–100)

## 2023-05-07 LAB — PSA: PSA: <0.04 ng/mL (ref <=4.00)

## 2023-05-08 ENCOUNTER — Other Ambulatory Visit: Payer: Self-pay | Admitting: Internal Medicine

## 2023-05-08 NOTE — Progress Notes (Signed)
^<^<^<^<^<^<^<^<^<^<^<^<^<^<^<^<^<^<^<^<^<^<^<^<^<^<^<^<^<^<^<^<^<^<^<^<^ ^>^>^>^>^>^>^>^>^>^>^>>^>^>^>^>^>^>^>^>^>^>^>^>^>^>^>^>^>^>^>^>^>^>^>^>^>  -  Chol = 132 - Excellent   !     Continue  Atorvastatin Same   ^<^<^<^<^<^<^<^<^<^<^<^<^<^<^<^<^<^<^<^<^<^<^<^<^<^<^<^<^<^<^<^<^<^<^<^<^  - But Triglycerides ( =  269  ) or fats in blood are too high                 (   Ideal or  Goal is less than 150  !  )    - Recommend avoid fried & greasy foods,  sweets / candy,   - Avoid white rice  (brown or wild rice or Quinoa is OK),   - Avoid white potatoes  (sweet potatoes are OK)   - Avoid anything made from white flour  - bagels, doughnuts, rolls, buns, biscuits, white and   wheat breads, pizza crust and traditional  pasta made of white flour & egg white  - (vegetarian pasta or spinach or wheat pasta is OK).    - Multi-grain bread is OK - like multi-grain flat bread or  sandwich thins.   - Avoid alcohol in excess.   - Exercise is also important.  ^<^<^<^<^<^<^<^<^<^<^<^<^<^<^<^<^<^<^<^<^<^<^<^<^<^<^<^<^<^<^<^<^<^<^<^<^ ^>^>^>^>^>^>^>^>^>^>^>^>^>^>^>^>^>^>^>^>^>^>^>^>^>^>^>^>^>^>^>^>^>^>^>^>^  -  A1c = 7.6  %   - worse   Your blood sugar and A1c are too high  !   Being diabetic has a  300% increased risk for heart attack,                                    stroke, cancer, and alzheimer- type vascular dementia.   It is very important that you work harder with diet by                     avoiding all foods that are white except chicken,fish & calliflower.  - Avoid white rice  (brown & wild rice is OK),   - Avoid white potatoes  (sweet potatoes in moderation is OK),   White bread or wheat bread or anything made out of   white flour like bagels, donuts, rolls, buns, biscuits, cakes,  - pastries, cookies, pizza crust, and pasta (made from white flour & egg whites)   - vegetarian pasta or spinach or wheat pasta is OK.  - Multigrain breads like Arnold's, Pepperidge Farm or                                                  multigrain sandwich thins or high fiber breads like   [place] bread or "Dave's Killer" breads that are 4 to 5 grams fiber per slice !  are best.    Diet, exercise and weight loss can reverse and cure diabetes in the early stages.    - Diet, exercise and weight loss is very important in the   control and prevention of complications of diabetes which                                                                     affects every system in your body, ie.   -Brain - dementia/stroke,  - eyes -  glaucoma/blindness,  - heart - heart attack/heart failure,  - kidneys - dialysis,  - stomach - gastric paralysis,  - intestines - malabsorption,  - nerves - severe painful neuritis,  - circulation - gangrene & loss of a leg(s)  - and finally  . . . . . . . . . . . . . . . . . .    - cancer and Alzheimers.  ^<^<^<^<^<^<^<^<^<^<^<^<^<^<^<^<^<^<^<^<^<^<^<^<^<^<^<^<^<^<^<^<^<^<^<^<^ ^>^>^>^>^>^>^>^>^>^>^>^>^>^>^>^>^>^>^>^>^>^>^>^>^>^>^>^>^>^>^>^>^>^>^>^>^  -  PSA  -   Undetectible  - Great - No Sign of Prostate Cancer  !   ^<^<^<^<^<^<^<^<^<^<^<^<^<^<^<^<^<^<^<^<^<^<^<^<^<^<^<^<^<^<^<^<^<^<^<^<^ ^>^>^>^>^>^>^>^>^>^>^>^>^>^>^>^>^>^>^>^>^>^>^>^>^>^>^>^>^>^>^>^>^>^>^>^>^  -  Vitamin D = 54 is a little low    - Vitamin D goal is between 70-100.   - Please make sure that you are taking your Vitamin D as directed.   - It is very important as a natural anti-inflammatory and helping the                           immune system protect against viral infections, like the Covid-19    helping hair, skin, and nails, as well as reducing stroke and heart attack risk.   - It helps your bones and helps with mood.  - It also decreases numerous cancer risks so please                                                                                           take it as directed.   - Low Vit D is associated with a 200-300% higher risk for CANCER   and 200-300%  higher risk for HEART   ATTACK  &  STROKE.    - It is also associated with higher death rate at younger ages,   autoimmune diseases like Rheumatoid arthritis, Lupus, Multiple Sclerosis.     - Also many other serious conditions, like depression, Alzheimer's  Dementia,  muscle aches, fatigue, fibromyalgia   ^<^<^<^<^<^<^<^<^<^<^<^<^<^<^<^<^<^<^<^<^<^<^<^<^<^<^<^<^<^<^<^<^<^<^<^<^ ^>^>^>^>^>^>^>^>^>^>^>^>^>^>^>^>^>^>^>^>^>^>^>^>^>^>^>^>^>^>^>^>^>^>^>^>^  -  All Else - CBC - Kidneys - Electrolytes - Liver - Magnesium & Thyroid    - all  Normal / OK  ^<^<^<^<^<^<^<^<^<^<^<^<^<^<^<^<^<^<^<^<^<^<^<^<^<^<^<^<^<^<^<^<^<^<^<^<^ ^>^>^>^>^>^>^>^>^>^>^>^>^>^>^>^>^>^>^>^>^>^>^>^>^>^>^>^>^>^>^>^>^>^>^>^>^

## 2023-05-09 ENCOUNTER — Encounter: Payer: Self-pay | Admitting: Internal Medicine

## 2023-06-16 ENCOUNTER — Other Ambulatory Visit: Payer: Self-pay | Admitting: Internal Medicine

## 2023-06-16 DIAGNOSIS — F339 Major depressive disorder, recurrent, unspecified: Secondary | ICD-10-CM

## 2023-06-21 ENCOUNTER — Encounter: Payer: Self-pay | Admitting: Internal Medicine

## 2023-06-23 ENCOUNTER — Other Ambulatory Visit: Payer: Self-pay | Admitting: Nurse Practitioner

## 2023-06-23 DIAGNOSIS — E119 Type 2 diabetes mellitus without complications: Secondary | ICD-10-CM

## 2023-07-01 DIAGNOSIS — H401131 Primary open-angle glaucoma, bilateral, mild stage: Secondary | ICD-10-CM | POA: Diagnosis not present

## 2023-07-26 ENCOUNTER — Other Ambulatory Visit (HOSPITAL_BASED_OUTPATIENT_CLINIC_OR_DEPARTMENT_OTHER): Payer: Self-pay | Admitting: Cardiology

## 2023-08-05 DIAGNOSIS — H401131 Primary open-angle glaucoma, bilateral, mild stage: Secondary | ICD-10-CM | POA: Diagnosis not present

## 2023-08-16 NOTE — Progress Notes (Unsigned)
FOLLOW UP   Assessment:     Type 2 diabetes mellitus with stage 2 chronic kidney disease, with long-term current use of insulin (HCC) Blood sugars are running low 90-130,Continue NPH to 40 units BID and monitor blood sugars Continue Metformin 1000mg  BID Monitor injection sites Education: Reviewed 'ABCs' of diabetes management (respective goals in parentheses):  A1C (<7.5), blood pressure (<130/80), and cholesterol (LDL <70) Eye Exam yearly and Dental Exam every 6 months. Dietary recommendations Physical Activity recommendations   CKD stage 2 due to Ty[e 2 Diabetes mellitus(HCC) Push fluids, avoid NSAIDS, control blood sugars - Microalbumin/Creatinine urine ratio - UA routine with reflex microscopic  ASCAD s/p PTCA (06/2012) Control blood pressure, cholesterol, glucose, increase exercise.  Followed by cardiology Nitroglycerine as needed  Medication management Continued  Class 2 Severe Obesity with Serious Comorbidity(HCC) Long discussion about weight loss, diet, and exercise Recommended diet heavy in fruits and veggies and low in animal meats, cheeses, and dairy products, appropriate calorie intake Patient will work on walking daily 10-15 min, twice a day if can, with wife Discussed appropriate weight for height  Follow up at next visit  Atherosclerosis of aorta [facility]) Chest Xray 01/11/21 Control blood pressure, cholesterol, glucose, increase exercise.        Further disposition pending results if labs check today. Discussed med's effects and SE's.   Over 30 minutes of face to face interview, exam, counseling, chart review, and critical decision making was performed.    Future Appointments  Date Time Provider Department Center  08/17/2023 10:30 AM Raynelle Dick, NP [facility] None  12/06/2023 11:00 AM Raynelle Dick, NP [facility] None  03/09/2024 10:30 AM Lucky Cowboy, MD [facility] None  06/12/2024 10:00 AM Lucky Cowboy, MD [facility] None         Subjective:  Michael Guzman is a 77 y.o. male who presents accompanied by his wife for Medicare Annual Wellness Visit and 3 month follow up for HTN, HLD, T2 diabetes, and vitamin D Def.   He has hx of ASCAD s/p PTCA (06/2012), last stress test 10/2018 was normal obtained for surgical clearance, followed by Dr. Anne Fu;   Former smoker, 37.5 pack year history, quit in 2012; last CXR 12/2020 was normal  hx of prostate cancer treated by radiation in 2016-2017; he is followed by Dr. Eskridge q34m, last visit 03/2020  Wellbutrin 300 mg daily, denies feelings of depression.   BMI is There is no height or weight on file to calculate BMI., he has been working on diet, eating less, He has not been doing much exercise.  He has not been doing as well with his diet, has been having more sweets due to the holidays Wt Readings from Last 3 Encounters:  05/06/23 200 lb (90.7 kg)  12/04/22 201 lb 12.8 oz (91.5 kg)  09/21/22 211 lb 3.2 oz (95.8 kg)   His blood pressure has been controlled at home, he is on toprol XL 1.25 mg daily only, day their BP is    BP Readings from Last 3 Encounters:  05/06/23 122/80  12/04/22 122/80  09/21/22 (!) 154/93  He does workout. He denies chest pain, shortness of breath, dizziness.   He has been working on diet and exercise for Diabetes with diabetic chronic kidney disease, with other circulatory complications and with diabetic polyneuropathy , he is on bASA, he is not on ACE/ARB due to low BPS, sugars in the AM are 90-130. He forgot his BS logs today. He wakes up between 3-5 am and  starts breakfast at 6 pm, then dinner is 3-4 PM Currently taking Humulin 70/30 40 units BID- does not take insulin if 119 or less, will only take metformin Also on metformin 2000 mg daily. He denies hypoglycemia , polydipsia and polyuria. He has gabapentin prescribed for intermittent LE parasthesias, takes 800 mg QID PRN, none in the last month.  Last A1C was:  Lab Results   Component Value Date   HGBA1C 7.6 (H) 05/06/2023   Lab Results  Component Value Date   EGFR 78 05/06/2023   Medication Review: Current Outpatient Medications on File Prior to Visit  Medication Sig Dispense Refill   Ascorbic Acid (VITAMIN C) 1000 MG tablet Take 1,000 mg by mouth daily.     aspirin EC 81 MG tablet Take 1 tablet (81 mg total) by mouth daily. 90 tablet 3   atorvastatin (LIPITOR) 10 MG tablet TAKE 1 TABLET BY MOUTH DAILY 100 tablet 2   B Complex-C (SUPER B COMPLEX PO) Take 1 tablet by mouth daily.     Blood Glucose Monitoring Suppl (ONE TOUCH ULTRA 2) w/Device KIT Use as directed 1 kit 0   buPROPion (WELLBUTRIN XL) 300 MG 24 hr tablet TAKE 1 TABLET BY MOUTH DAILY FOR MOOD, FOCUS AND CONCENTRATION 90 tablet 3   Cholecalciferol (VITAMIN D3) 25 MCG (1000 UT) CAPS Take 1,000 Units by mouth daily.     furosemide (LASIX) 40 MG tablet Take 1 tablet 2 x /day as needed  for Fluid Retention (Patient taking differently: Take 20 mg by mouth 2 (two) times daily as needed for fluid or edema (Fluid Retention).) 180 tablet 3   gabapentin (NEURONTIN) 800 MG tablet TAKE 1 TABLET BY MOUTH 3 TO 4  TIMES DAILY FOR PAIN, HOT  FLASHES AND SWEATS 400 tablet 2   glucose blood (ONETOUCH ULTRA) test strip CHECK BLOOD SUGAR 3 TIMES  DAILY 300 strip 3   glycopyrrolate (ROBINUL) 2 MG tablet Take  1 tablet  3 x / day for Excessive Sweating  TAKE ONE TABLET BY                                                 MOUTH 270 tablet 3   HUMULIN 70/30 (70-30) 100 UNIT/ML injection INJECT SUBCUTANEOUSLY 50 UNITS  TWICE DAILY (Patient taking differently: Inject 40 Units into the skin 2 (two) times daily with a meal.) 100 mL 2   Insulin Syringes, Disposable, U-100 1 ML MISC 50 Units by Does not apply route 2 (two) times daily. inject 50 units into skin 2 x /day 200 each 3   Lancets (ONETOUCH DELICA PLUS LANCET33G) MISC CHECK BLOOD SUGAR 3 TIMES  DAILY 300 each 3   latanoprost (XALATAN) 0.005 % ophthalmic solution Place 1  drop into both eyes at bedtime.     Magnesium 500 MG TABS Take 500 mg by mouth daily.     metFORMIN (GLUCOPHAGE-XR) 500 MG 24 hr tablet TAKE 2 TABLETS BY MOUTH TWICE  DAILY WITH MEALS FOR DIABETES 400 tablet 2   metoprolol succinate (TOPROL-XL) 25 MG 24 hr tablet TAKE ONE-HALF TABLET BY MOUTH  DAILY 15 tablet 0   montelukast (SINGULAIR) 10 MG tablet TAKE 1 TABLET BY MOUTH DAILY FOR ALLERGIES 100 tablet 2   Multiple Vitamin (MULTIVITAMIN WITH MINERALS) TABS tablet Take 1 tablet by mouth daily. men     Multiple Vitamins-Minerals (  ZINC PO) Take 50 mg by mouth daily.     nitroGLYCERIN (NITROSTAT) 0.4 MG SL tablet PLACE 1 TABLET UNDER THE TONGUE EVERY 5 MINUTES AS NEEDED FOR CHEST PAIN 25 tablet 6   Omega-3 Fatty Acids (FISH OIL) 1200 MG CAPS Take 1,200 mg by mouth daily.     ondansetron (ZOFRAN) 8 MG tablet Take 1 tablet (8 mg total) by mouth every 8 (eight) hours as needed for nausea or vomiting. 6 tablet 0   pantoprazole (PROTONIX) 40 MG tablet TAKE 1 TABLET BY MOUTH DAILY TO  PREVENT HEARTBURN AND  INDIGESTION 100 tablet 2   Polyethyl Glycol-Propyl Glycol (SYSTANE) 0.4-0.3 % SOLN Place 2 drops into both eyes daily as needed (for dry eyes).     tamsulosin (FLOMAX) 0.4 MG CAPS capsule Take 0.4 mg by mouth at bedtime.     Vitamin E 400 units TABS Take 400 Units by mouth daily.     No current facility-administered medications on file prior to visit.    Allergies: No Known Allergies  Current Problems (verified) has Hypertension (1991); GERD; ASCAD s/p PTCA (06/2012); Hyperlipidemia associated with type 2 diabetes mellitus (HCC); Vitamin D deficiency; Medication management; Diabetic peripheral neuropathy (HCC); Malignant neoplasm of prostate (HCC); Thyroiditis; Obesity (BMI 30.0-34.9); Type 2 diabetes mellitus with vascular disease (HCC); Aortic atherosclerosis (HCC) by Abd CT scan 2015; Microalbuminuric diabetic nephropathy (HCC); Hypertriglyceridemia; Fatty infiltration of liver; Former smoker;  Primary osteoarthritis of hip; AMS (altered mental status); Hypoglycemia due to insulin; Acute encephalopathy; Hypothermia; Diabetes mellitus with coincident hypertension (HCC); Diaphoresis; Inadequate sleep hygiene; Hypersomnia with sleep apnea; Excessive daytime sleepiness; Excessive postexertional fatigue; Adenomatous polyp of transverse colon; Hx of adenomatous colonic polyps; Post colonoscopy pain nausea and vomiting; and Abnormal colonoscopy on their problem list.  Screening Tests Immunization History  Administered Date(s) Administered   Influenza Split 06/26/2012   Influenza, High Dose Seasonal PF 06/20/2015, 06/23/2016, 06/14/2017, 06/14/2019, 07/01/2020, 07/21/2021, 08/03/2022   Influenza-Unspecified 06/14/2013, 06/25/2014, 06/03/2018   PFIZER(Purple Top)SARS-COV-2 Vaccination 10/28/2019, 11/22/2019, 06/26/2020   Pneumococcal Conjugate-13 04/23/2016   Pneumococcal Polysaccharide-23 06/14/2017   Pneumococcal-Unspecified 06/16/2010   Td 09/17/2003   Tdap 11/26/2013   Zoster, Live 07/12/2012    Health Maintenance  Topic Date Due   Zoster Vaccines- Shingrix (1 of 2) 06/02/1966   INFLUENZA VACCINE  04/15/2023   COVID-19 Vaccine (4 - 2023-24 season) 05/16/2023   OPHTHALMOLOGY EXAM  06/24/2023   HEMOGLOBIN A1C  11/06/2023   DTaP/Tdap/Td (3 - Td or Tdap) 11/27/2023   Medicare Annual Wellness (AWV)  12/04/2023   Diabetic kidney evaluation - eGFR measurement  05/05/2024   Diabetic kidney evaluation - Urine ACR  05/05/2024   FOOT EXAM  05/05/2024   Pneumonia Vaccine 55+ Years old  Completed   Hepatitis C Screening  Completed   HPV VACCINES  Aged Out   Lung Cancer Screening  Discontinued   Colonoscopy  Discontinued    Names of Other Physician/Practitioners you currently use: 1. [facility] here for primary care 2. Dr. Nile Riggs, eye doctor, last visit 06/17/22, mild glaucoma uses Latanoprost in both eyes 3. [facility], dentist, last  visit 2023  Patient Care Team: Lucky Cowboy, MD as PCP - General (Internal Medicine) Jake Bathe, MD as PCP - Cardiology (Cardiology) Sundra Aland, [facility] (Inactive) as Pharmacist (Pharmacist)  Surgical: He  has a past surgical history that includes Appendectomy; Cardiac surgery (~2017); left heart catheterization with coronary angiogram (N/A, 06/27/2012); left heart catheterization with coronary angiogram (N/A, 07/25/2012); Colonoscopy (  02/12/2010); Radiology with anesthesia (N/A, 12/03/2015); Prostate biopsy; Total hip arthroplasty (Right, 04/28/2019); Colonoscopy with propofol (N/A, 09/21/2022); Endoscopic mucosal resection (N/A, 09/21/2022); Submucosal lifting injection (09/21/2022); and Hemostasis clip placement (09/21/2022). Family His family history includes Diabetes in his sister. Social history  He reports that he quit smoking about 12 years ago. His smoking use included cigarettes. He started smoking about 64 years ago. He has a 39.6 pack-year smoking history. He has never used smokeless tobacco. He reports that he does not currently use alcohol. He reports that he does not use drugs.   Review of Systems  Constitutional:  Negative for chills, fever and weight loss.  HENT:  Positive for tinnitus. Negative for congestion and hearing loss.   Eyes:  Negative for blurred vision and double vision.  Respiratory:  Negative for cough and shortness of breath.   Cardiovascular:  Negative for chest pain, palpitations, orthopnea and leg swelling.  Gastrointestinal:  Negative for abdominal pain, constipation, diarrhea, heartburn, nausea and vomiting.       Decreased appetite  Genitourinary:  Negative for dysuria.  Musculoskeletal:  Negative for falls, joint pain and myalgias.  Skin:  Negative for rash.  Neurological:  Positive for tingling (burning pain in feet). Negative for dizziness, tremors, loss of consciousness and headaches.  Endo/Heme/Allergies:  Does not bruise/bleed easily.   Psychiatric/Behavioral:  Negative for depression, memory loss and suicidal ideas.      Objective:   There were no vitals filed for this visit.   There is no height or weight on file to calculate BMI.  General appearance: alert, no distress, WD/WN, male HEENT: normocephalic, sclerae anicteric, TMs pearly, nares patent, no discharge or erythema, pharynx normal Oral cavity: MMM, no lesions Neck: supple, no lymphadenopathy, no thyromegaly, no masses Heart: RRR, normal S1, S2, no murmurs Lungs: CTA bilaterally, no wheezes, rhonchi, or rales Abdomen: +bs, soft, non tender, non distended, no masses, no hepatomegaly, no splenomegaly Musculoskeletal: nontender, no swelling, no obvious deformity; slow sitting to standing, slow steady gait  Extremities: no edema, no cyanosis, no clubbing Pulses: 2+ symmetric, upper and lower extremities, normal cap refill Neurological: alert, oriented x 3, CN2-12 intact, strength normal upper extremities and lower extremities, sensation was normal bilateral feet to monofilament, DTRs 2+ throughout, no cerebellar signs Psychiatric: depressed affect, behavior normal    [facility]  08/16/2023

## 2023-08-17 ENCOUNTER — Ambulatory Visit (INDEPENDENT_AMBULATORY_CARE_PROVIDER_SITE_OTHER): Payer: Medicare Other | Admitting: Nurse Practitioner

## 2023-08-17 ENCOUNTER — Encounter: Payer: Self-pay | Admitting: Nurse Practitioner

## 2023-08-17 VITALS — BP 118/72 | HR 78 | Temp 97.2°F | Ht 70.0 in | Wt 202.0 lb

## 2023-08-17 DIAGNOSIS — N182 Chronic kidney disease, stage 2 (mild): Secondary | ICD-10-CM | POA: Diagnosis not present

## 2023-08-17 DIAGNOSIS — I10 Essential (primary) hypertension: Secondary | ICD-10-CM | POA: Diagnosis not present

## 2023-08-17 DIAGNOSIS — E1142 Type 2 diabetes mellitus with diabetic polyneuropathy: Secondary | ICD-10-CM | POA: Diagnosis not present

## 2023-08-17 DIAGNOSIS — I700 Atherosclerosis of aorta: Secondary | ICD-10-CM | POA: Diagnosis not present

## 2023-08-17 DIAGNOSIS — E1122 Type 2 diabetes mellitus with diabetic chronic kidney disease: Secondary | ICD-10-CM | POA: Diagnosis not present

## 2023-08-17 DIAGNOSIS — E1169 Type 2 diabetes mellitus with other specified complication: Secondary | ICD-10-CM | POA: Diagnosis not present

## 2023-08-17 DIAGNOSIS — Z23 Encounter for immunization: Secondary | ICD-10-CM

## 2023-08-17 DIAGNOSIS — E1159 Type 2 diabetes mellitus with other circulatory complications: Secondary | ICD-10-CM | POA: Diagnosis not present

## 2023-08-17 DIAGNOSIS — Z9861 Coronary angioplasty status: Secondary | ICD-10-CM

## 2023-08-17 DIAGNOSIS — E785 Hyperlipidemia, unspecified: Secondary | ICD-10-CM

## 2023-08-17 DIAGNOSIS — C61 Malignant neoplasm of prostate: Secondary | ICD-10-CM

## 2023-08-17 DIAGNOSIS — Z87891 Personal history of nicotine dependence: Secondary | ICD-10-CM | POA: Diagnosis not present

## 2023-08-17 DIAGNOSIS — I2510 Atherosclerotic heart disease of native coronary artery without angina pectoris: Secondary | ICD-10-CM | POA: Diagnosis not present

## 2023-08-17 DIAGNOSIS — E871 Hypo-osmolality and hyponatremia: Secondary | ICD-10-CM | POA: Diagnosis not present

## 2023-08-17 DIAGNOSIS — Z79899 Other long term (current) drug therapy: Secondary | ICD-10-CM | POA: Diagnosis not present

## 2023-08-17 DIAGNOSIS — Z794 Long term (current) use of insulin: Secondary | ICD-10-CM

## 2023-08-17 DIAGNOSIS — D649 Anemia, unspecified: Secondary | ICD-10-CM | POA: Diagnosis not present

## 2023-08-17 DIAGNOSIS — E669 Obesity, unspecified: Secondary | ICD-10-CM

## 2023-08-17 NOTE — Patient Instructions (Signed)
Chest xray ordered at [facility] [address] Walk In M-F 8:30-3:45

## 2023-08-18 LAB — CBC WITH DIFFERENTIAL/PLATELET
Absolute Lymphocytes: 1844 {cells}/uL (ref 850–3900)
Absolute Monocytes: 551 {cells}/uL (ref 200–950)
Basophils Absolute: 48 {cells}/uL (ref 0–200)
Basophils Relative: 0.9 %
Eosinophils Absolute: 180 {cells}/uL (ref 15–500)
Eosinophils Relative: 3.4 %
HCT: 39.4 % (ref 38.5–50.0)
Hemoglobin: 13.1 g/dL — ABNORMAL LOW (ref 13.2–17.1)
MCH: 29.7 pg (ref 27.0–33.0)
MCHC: 33.2 g/dL (ref 32.0–36.0)
MCV: 89.3 fL (ref 80.0–100.0)
MPV: 11.5 fL (ref 7.5–12.5)
Monocytes Relative: 10.4 %
Neutro Abs: 2677 {cells}/uL (ref 1500–7800)
Neutrophils Relative %: 50.5 %
Platelets: 199 10*3/uL (ref 140–400)
RBC: 4.41 10*6/uL (ref 4.20–5.80)
RDW: 12 % (ref 11.0–15.0)
Total Lymphocyte: 34.8 %
WBC: 5.3 10*3/uL (ref 3.8–10.8)

## 2023-08-18 LAB — LIPID PANEL
Cholesterol: 144 mg/dL (ref <200)
HDL: 54 mg/dL (ref >=40)
LDL Cholesterol (Calc): 63 mg/dL
Non-HDL Cholesterol (Calc): 90 mg/dL (ref <130)
Total CHOL/HDL Ratio: 2.7 (calc) (ref <5.0)
Triglycerides: 200 mg/dL — ABNORMAL HIGH (ref <150)

## 2023-08-18 LAB — COMPLETE METABOLIC PANEL WITH GFR
AG Ratio: 1.9 (calc) (ref 1.0–2.5)
ALT: 22 U/L (ref 9–46)
AST: 22 U/L (ref 10–35)
Albumin: 4.9 g/dL (ref 3.6–5.1)
Alkaline phosphatase (APISO): 63 U/L (ref 35–144)
BUN: 12 mg/dL (ref 7–25)
CO2: 27 mmol/L (ref 20–32)
Calcium: 10.1 mg/dL (ref 8.6–10.3)
Chloride: 97 mmol/L — ABNORMAL LOW (ref 98–110)
Creat: 1.08 mg/dL (ref 0.70–1.28)
Globulin: 2.6 g/dL (ref 1.9–3.7)
Glucose, Bld: 148 mg/dL — ABNORMAL HIGH (ref 65–99)
Potassium: 4.7 mmol/L (ref 3.5–5.3)
Sodium: 133 mmol/L — ABNORMAL LOW (ref 135–146)
Total Bilirubin: 0.4 mg/dL (ref 0.2–1.2)
Total Protein: 7.5 g/dL (ref 6.1–8.1)
eGFR: 71 mL/min/{1.73_m2} (ref >=60)

## 2023-08-18 LAB — MAGNESIUM: Magnesium: 1.9 mg/dL (ref 1.5–2.5)

## 2023-08-18 LAB — IRON,TIBC AND FERRITIN PANEL
%SAT: 28 % (ref 20–48)
Ferritin: 30 ng/mL (ref 24–380)
Iron: 84 ug/dL (ref 50–180)
TIBC: 301 ug/dL (ref 250–425)

## 2023-08-18 LAB — HEMOGLOBIN A1C W/OUT EAG: Hgb A1c MFr Bld: 7.8 %{Hb} — ABNORMAL HIGH (ref <5.7)

## 2023-09-13 ENCOUNTER — Telehealth: Payer: Self-pay | Admitting: Cardiology

## 2023-09-13 MED ORDER — METOPROLOL SUCCINATE ER 25 MG PO TB24: 12.5000 mg | ORAL_TABLET | Freq: Every day | ORAL | 0 refills | Status: DC

## 2023-09-13 NOTE — Telephone Encounter (Signed)
 Pt scheduled for 10/12/23

## 2023-09-13 NOTE — Telephone Encounter (Signed)
Called Michael Guzman to inform him that he needed an appt to see Dr. Anne Fu before a 90 day supply and Michael Guzman stated that he would make the appt first before a 15 day supply being sent in.

## 2023-09-13 NOTE — Telephone Encounter (Signed)
*  STAT* If patient is at the pharmacy, call can be transferred to refill team.   1. Which medications need to be refilled? (please list name of each medication and dose if known) new prescription for Metoprolol   2. Would you like to learn more about the convenience, safety, & potential cost savings by using the [facility]?    3. Are you open to using the [facility] (Type [facility]. .   4. Which pharmacy/location (including street and city if local pharmacy) is medication to be sent to? [facility]   5. Do they need a 30 day or 90 day supply? 90 days and refills

## 2023-09-13 NOTE — Telephone Encounter (Signed)
Pt's medication was sent to pt's pharmacy as requested. Confirmation received.  °

## 2023-10-12 ENCOUNTER — Ambulatory Visit: Payer: Medicare Other | Attending: Cardiology | Admitting: Cardiology

## 2023-10-12 ENCOUNTER — Encounter: Payer: Self-pay | Admitting: Cardiology

## 2023-10-12 VITALS — BP 142/82 | HR 69 | Ht 70.0 in | Wt 200.6 lb

## 2023-10-12 DIAGNOSIS — I2510 Atherosclerotic heart disease of native coronary artery without angina pectoris: Secondary | ICD-10-CM

## 2023-10-12 DIAGNOSIS — E119 Type 2 diabetes mellitus without complications: Secondary | ICD-10-CM

## 2023-10-12 DIAGNOSIS — I10 Essential (primary) hypertension: Secondary | ICD-10-CM

## 2023-10-12 DIAGNOSIS — R072 Precordial pain: Secondary | ICD-10-CM | POA: Diagnosis not present

## 2023-10-12 DIAGNOSIS — Z9861 Coronary angioplasty status: Secondary | ICD-10-CM

## 2023-10-12 NOTE — Patient Instructions (Signed)
Medication Instructions:  The current medical regimen is effective;  continue present plan and medications.  *If you need a refill on your cardiac medications before your next appointment, please call your pharmacy*   Testing/Procedures:    Please report to Radiology at the [facility] Main Entrance 30 minutes early for your test.  [address] [place], [place] [address]   How to Prepare for Your Cardiac PET/CT Stress Test:  Nothing to eat or drink, except water, 3 hours prior to arrival time.  NO caffeine/decaffeinated products, or chocolate 12 hours prior to arrival. (Please note decaffeinated beverages (teas/coffees) still contain caffeine).  If you have caffeine within 12 hours prior, the test will need to be rescheduled.  Medication instructions: Do not take erectile dysfunction medications for 72 hours prior to test (sildenafil, tadalafil) Do not take nitrates (isosorbide mononitrate, Ranexa) the day before or day of test Do not take tamsulosin the day before or morning of test Hold theophylline containing medications for 12 hours. Hold Dipyridamole 48 hours prior to the test.  Diabetic Preparation: If able to eat breakfast prior to 3 hour fasting, you may take all medications, including your insulin. Do not worry if you miss your breakfast dose of insulin - start at your next meal. If you do not eat prior to 3 hour fast-Hold all diabetes (oral and insulin) medications. Patients who wear a continuous glucose monitor MUST remove the device prior to scanning.  You may take your remaining medications with water.  NO perfume, cologne or lotion on chest or abdomen area. FEMALES - Please avoid wearing dresses to this appointment.  Total time is 1 to 2 hours; you may want to bring reading material for the waiting time.  IF YOU THINK YOU MAY BE PREGNANT, OR ARE NURSING PLEASE INFORM THE TECHNOLOGIST.  In preparation for your appointment, medication and supplies  will be purchased.  Appointment availability is limited, so if you need to cancel or reschedule, please call the Radiology Department Scheduler at [phone] 24 hours in advance to avoid a cancellation fee of $100.00  What to Expect When you Arrive:  Once you arrive and check in for your appointment, you will be taken to a preparation room within the Radiology Department.  A technologist or Nurse will obtain your medical history, verify that you are correctly prepped for the exam, and explain the procedure.  Afterwards, an IV will be started in your arm and electrodes will be placed on your skin for EKG monitoring during the stress portion of the exam. Then you will be escorted to the PET/CT scanner.  There, staff will get you positioned on the scanner and obtain a blood pressure and EKG.  During the exam, you will continue to be connected to the EKG and blood pressure machines.  A small, safe amount of a radioactive tracer will be injected in your IV to obtain a series of pictures of your heart along with an injection of a stress agent.    After your Exam:  It is recommended that you eat a meal and drink a caffeinated beverage to counter act any effects of the stress agent.  Drink plenty of fluids for the remainder of the day and urinate frequently for the first couple of hours after the exam.  Your doctor will inform you of your test results within 7-10 business days.  For more information and frequently asked questions, please visit our website: [email or web address]  For questions about your test or how  to prepare for your test, please call: Cardiac Imaging Nurse Navigators Office: [phone]    Follow-Up: At [facility], you and your health needs are our priority.  As part of our continuing mission to provide you with exceptional heart care, we have created designated Provider Care Teams.  These Care Teams include your primary Cardiologist (physician) and Advanced  Practice Providers (APPs -  Physician Assistants and Nurse Practitioners) who all work together to provide you with the care you need, when you need it.  We recommend signing up for the patient portal called "MyChart".  Sign up information is provided on this After Visit Summary.  MyChart is used to connect with patients for Virtual Visits (Telemedicine).  Patients are able to view lab/test results, encounter notes, upcoming appointments, etc.  Non-urgent messages can be sent to your provider as well.   To learn more about what you can do with MyChart, go to [email or web address].    Your next appointment:   6 month(s)  Provider:   Robin Searing, NP

## 2023-10-12 NOTE — Progress Notes (Signed)
Cardiology Office Note:  .   Date:  10/12/2023  ID:  Michael Guzman, DOB [birth year], MRN [id number] PCP: Lucky Cowboy, MD  [facility] Providers Cardiologist:  Donato Schultz, MD     History of Present Illness: .   Michael Guzman is a 78 y.o. male Discussed with the use of AI scribe   History of Present Illness   He has a history of coronary artery disease and underwent cardiac catheterization in 2013 with drug-eluting stents placed in the mid LAD and obtuse marginal branch. He experiences chest tightness on exertion, such as when carrying water from the grocery store to the car, which necessitates sitting down to recover. There is no significant change in these symptoms compared to previous experiences. A nuclear stress test in February 2020 was normal, showing low risk and normal ejection fraction. He takes aspirin 81 mg, atorvastatin 10 mg, and metoprolol 12.5 mg daily, and uses furosemide 40 mg as needed for fluid retention.  He has a history of diabetes and hypertension. His diabetes is managed with insulin by his primary care provider, and his hemoglobin A1c is 7.8, which is slightly elevated. His creatinine is 1.08, and potassium is 4.7, both within normal limits.  He also has a history of prostate cancer and receives Lupron injections. He experiences fatigue, which may be related to his cancer treatment.  He mentions trying to increase his physical activity by walking more, especially since his wife's passing.          ROS: No bleeding, no syncope  Studies Reviewed: .        Results   LABS LDL: 63 (08/17/2023) HbA1c: 7.8 Creatinine: 1.08 Potassium: 4.7  DIAGNOSTIC Nuclear stress test: Normal, low risk, normal ejection fraction (10/2018) Echocardiogram: EF 60%, mild mitral regurgitation (MR), mild tricuspid regurgitation (TR), grade one diastolic dysfunction     Risk Assessment/Calculations:           Physical Exam:   VS:  BP (!) 142/82   Pulse 69    Ht 5\' 10"  (1.778 m)   Wt 200 lb 9.6 oz (91 kg)   SpO2 97%   BMI 28.78 kg/m    Wt Readings from Last 3 Encounters:  10/12/23 200 lb 9.6 oz (91 kg)  08/17/23 202 lb (91.6 kg)  05/06/23 200 lb (90.7 kg)    GEN: Well nourished, well developed in no acute distress, walks with cane NECK: No JVD; No carotid bruits CARDIAC: RRR, no murmurs, no rubs, no gallops RESPIRATORY:  Clear to auscultation without rales, wheezing or rhonchi  ABDOMEN: Soft, non-tender, non-distended EXTREMITIES:  No edema; No deformity   ASSESSMENT AND PLAN: .    Assessment and Plan    Coronary Artery Disease Angina Follow-up for coronary artery disease. Cardiac catheterization in 2013 with drug-eluting stents to mid LAD and obtuse marginal branch. Nuclear stress test in 2017 showed medium-sized area of attenuation with no significant reversible ischemia. Echocardiogram showed EF 60%, mild MR, mild TR, and grade one diastolic dysfunction. Nuclear stress test in February 2020 was normal with low risk and normal ejection fraction. Currently experiencing chest tightness with exertion, indicating possible ischemia. Discussed non-invasive cardiac PET stress test involving IV and CT scan to assess blood flow. If abnormalities are found, further intervention such as medication adjustment or heart catheterization may be necessary. - Order cardiac PET stress test - Schedule six-month follow-up with Alden Server  Hypertension Hypertension managed with metoprolol 12.5 mg daily. Blood pressure control is well-managed. - Continue  metoprolol 12.5 mg daily  Diabetes Mellitus Diabetes managed by primary care provider. Hemoglobin A1c is 7.8, slightly elevated. - Continue current diabetes management with primary care provider  Prostate Cancer Prostate cancer managed with Lupron injections. Reports fatigue, possibly related to Lupron therapy. - Continue Lupron injections as prescribed  General Health Maintenance LDL cholesterol is 63,  well-controlled. Creatinine is 1.08 and potassium is 4.7, both within normal limits. - Continue aspirin 81 mg daily - Continue atorvastatin 10 mg daily - Use furosemide 40 mg as needed for fluid retention  Follow-up - Schedule follow-up appointment in six months.               Signed, Donato Schultz, MD

## 2023-10-17 ENCOUNTER — Other Ambulatory Visit: Payer: Self-pay | Admitting: Cardiology

## 2023-11-02 DIAGNOSIS — M47816 Spondylosis without myelopathy or radiculopathy, lumbar region: Secondary | ICD-10-CM | POA: Diagnosis not present

## 2023-11-11 ENCOUNTER — Encounter (HOSPITAL_BASED_OUTPATIENT_CLINIC_OR_DEPARTMENT_OTHER): Payer: Self-pay

## 2023-11-11 ENCOUNTER — Other Ambulatory Visit: Payer: Self-pay

## 2023-11-11 ENCOUNTER — Inpatient Hospital Stay (HOSPITAL_BASED_OUTPATIENT_CLINIC_OR_DEPARTMENT_OTHER)
Admission: EM | Admit: 2023-11-11 | Discharge: 2023-11-13 | DRG: 641 | Disposition: A | Discharge status: Home / Self Care | Payer: Medicare Other | Attending: Internal Medicine | Admitting: Internal Medicine

## 2023-11-11 DIAGNOSIS — E871 Hypo-osmolality and hyponatremia: Secondary | ICD-10-CM | POA: Diagnosis not present

## 2023-11-11 DIAGNOSIS — Z96641 Presence of right artificial hip joint: Secondary | ICD-10-CM | POA: Diagnosis not present

## 2023-11-11 DIAGNOSIS — E1169 Type 2 diabetes mellitus with other specified complication: Secondary | ICD-10-CM | POA: Diagnosis not present

## 2023-11-11 DIAGNOSIS — Z79899 Other long term (current) drug therapy: Secondary | ICD-10-CM | POA: Diagnosis not present

## 2023-11-11 DIAGNOSIS — R112 Nausea with vomiting, unspecified: Secondary | ICD-10-CM | POA: Diagnosis present

## 2023-11-11 DIAGNOSIS — R630 Anorexia: Secondary | ICD-10-CM

## 2023-11-11 DIAGNOSIS — R001 Bradycardia, unspecified: Secondary | ICD-10-CM | POA: Diagnosis present

## 2023-11-11 DIAGNOSIS — E1142 Type 2 diabetes mellitus with diabetic polyneuropathy: Secondary | ICD-10-CM | POA: Diagnosis present

## 2023-11-11 DIAGNOSIS — K529 Noninfective gastroenteritis and colitis, unspecified: Secondary | ICD-10-CM | POA: Diagnosis not present

## 2023-11-11 DIAGNOSIS — Z794 Long term (current) use of insulin: Secondary | ICD-10-CM | POA: Diagnosis not present

## 2023-11-11 DIAGNOSIS — K760 Fatty (change of) liver, not elsewhere classified: Secondary | ICD-10-CM | POA: Diagnosis not present

## 2023-11-11 DIAGNOSIS — E861 Hypovolemia: Secondary | ICD-10-CM | POA: Diagnosis not present

## 2023-11-11 DIAGNOSIS — N400 Enlarged prostate without lower urinary tract symptoms: Secondary | ICD-10-CM | POA: Diagnosis present

## 2023-11-11 DIAGNOSIS — Z1152 Encounter for screening for COVID-19: Secondary | ICD-10-CM

## 2023-11-11 DIAGNOSIS — Z7984 Long term (current) use of oral hypoglycemic drugs: Secondary | ICD-10-CM

## 2023-11-11 DIAGNOSIS — Z923 Personal history of irradiation: Secondary | ICD-10-CM

## 2023-11-11 DIAGNOSIS — F419 Anxiety disorder, unspecified: Secondary | ICD-10-CM | POA: Diagnosis present

## 2023-11-11 DIAGNOSIS — I10 Essential (primary) hypertension: Secondary | ICD-10-CM | POA: Diagnosis present

## 2023-11-11 DIAGNOSIS — R61 Generalized hyperhidrosis: Secondary | ICD-10-CM | POA: Diagnosis present

## 2023-11-11 DIAGNOSIS — K219 Gastro-esophageal reflux disease without esophagitis: Secondary | ICD-10-CM | POA: Diagnosis not present

## 2023-11-11 DIAGNOSIS — Z87891 Personal history of nicotine dependence: Secondary | ICD-10-CM

## 2023-11-11 DIAGNOSIS — I2510 Atherosclerotic heart disease of native coronary artery without angina pectoris: Secondary | ICD-10-CM | POA: Diagnosis not present

## 2023-11-11 DIAGNOSIS — Z833 Family history of diabetes mellitus: Secondary | ICD-10-CM

## 2023-11-11 DIAGNOSIS — Z8546 Personal history of malignant neoplasm of prostate: Secondary | ICD-10-CM

## 2023-11-11 DIAGNOSIS — Z955 Presence of coronary angioplasty implant and graft: Secondary | ICD-10-CM | POA: Diagnosis not present

## 2023-11-11 DIAGNOSIS — F32A Depression, unspecified: Secondary | ICD-10-CM | POA: Diagnosis present

## 2023-11-11 DIAGNOSIS — E781 Pure hyperglyceridemia: Secondary | ICD-10-CM | POA: Diagnosis not present

## 2023-11-11 DIAGNOSIS — Z7982 Long term (current) use of aspirin: Secondary | ICD-10-CM

## 2023-11-11 DIAGNOSIS — R059 Cough, unspecified: Secondary | ICD-10-CM | POA: Diagnosis not present

## 2023-11-11 DIAGNOSIS — Z791 Long term (current) use of non-steroidal anti-inflammatories (NSAID): Secondary | ICD-10-CM

## 2023-11-11 DIAGNOSIS — R197 Diarrhea, unspecified: Secondary | ICD-10-CM | POA: Diagnosis not present

## 2023-11-11 DIAGNOSIS — R55 Syncope and collapse: Secondary | ICD-10-CM | POA: Diagnosis not present

## 2023-11-11 DIAGNOSIS — I700 Atherosclerosis of aorta: Secondary | ICD-10-CM | POA: Diagnosis not present

## 2023-11-11 HISTORY — DX: Nausea with vomiting, unspecified: R11.2

## 2023-11-11 LAB — COMPREHENSIVE METABOLIC PANEL
ALT: 23 U/L (ref 0–44)
AST: 27 U/L (ref 15–41)
Albumin: 5.2 g/dL — ABNORMAL HIGH (ref 3.5–5.0)
Alkaline Phosphatase: 63 U/L (ref 38–126)
Anion gap: 11 (ref 5–15)
BUN: 8 mg/dL (ref 8–23)
CO2: 23 mmol/L (ref 22–32)
Calcium: 10.2 mg/dL (ref 8.9–10.3)
Chloride: 85 mmol/L — ABNORMAL LOW (ref 98–111)
Creatinine, Ser: 0.9 mg/dL (ref 0.61–1.24)
GFR, Estimated: >60 mL/min (ref >60)
Glucose, Bld: 177 mg/dL — ABNORMAL HIGH (ref 70–99)
Potassium: 4.7 mmol/L (ref 3.5–5.1)
Sodium: 119 mmol/L — CL (ref 135–145)
Total Bilirubin: 0.7 mg/dL (ref 0.0–1.2)
Total Protein: 8.3 g/dL — ABNORMAL HIGH (ref 6.5–8.1)

## 2023-11-11 LAB — OSMOLALITY: Osmolality: 264 mosm/kg — ABNORMAL LOW (ref 275–295)

## 2023-11-11 LAB — CBC
HCT: 38.4 % — ABNORMAL LOW (ref 39.0–52.0)
Hemoglobin: 13.8 g/dL (ref 13.0–17.0)
MCH: 29.8 pg (ref 26.0–34.0)
MCHC: 35.9 g/dL (ref 30.0–36.0)
MCV: 82.9 fL (ref 80.0–100.0)
Platelets: 238 10*3/uL (ref 150–400)
RBC: 4.63 MIL/uL (ref 4.22–5.81)
RDW: 11.9 % (ref 11.5–15.5)
WBC: 6.4 10*3/uL (ref 4.0–10.5)
nRBC: 0 % (ref 0.0–0.2)

## 2023-11-11 LAB — URINALYSIS, ROUTINE W REFLEX MICROSCOPIC
Bacteria, UA: NONE SEEN
Bilirubin Urine: NEGATIVE
Glucose, UA: 100 mg/dL — AB
Hgb urine dipstick: NEGATIVE
Ketones, ur: 15 mg/dL — AB
Leukocytes,Ua: NEGATIVE
Nitrite: NEGATIVE
Protein, ur: 30 mg/dL — AB
Specific Gravity, Urine: 1.019 (ref 1.005–1.030)
pH: 7 (ref 5.0–8.0)

## 2023-11-11 LAB — RESP PANEL BY RT-PCR (RSV, FLU A&B, COVID)  RVPGX2
Influenza A by PCR: NEGATIVE
Influenza B by PCR: NEGATIVE
Resp Syncytial Virus by PCR: NEGATIVE
SARS Coronavirus 2 by RT PCR: NEGATIVE

## 2023-11-11 LAB — BASIC METABOLIC PANEL
Anion gap: 10 (ref 5–15)
Anion gap: 11 (ref 5–15)
BUN: 8 mg/dL (ref 8–23)
BUN: 8 mg/dL (ref 8–23)
CO2: 23 mmol/L (ref 22–32)
CO2: 20 mmol/L — ABNORMAL LOW (ref 22–32)
Calcium: 9.3 mg/dL (ref 8.9–10.3)
Calcium: 9.1 mg/dL (ref 8.9–10.3)
Chloride: 89 mmol/L — ABNORMAL LOW (ref 98–111)
Chloride: 92 mmol/L — ABNORMAL LOW (ref 98–111)
Creatinine, Ser: 0.8 mg/dL (ref 0.61–1.24)
Creatinine, Ser: 0.79 mg/dL (ref 0.61–1.24)
GFR, Estimated: >60 mL/min (ref >60)
GFR, Estimated: >60 mL/min (ref >60)
Glucose, Bld: 138 mg/dL — ABNORMAL HIGH (ref 70–99)
Glucose, Bld: 123 mg/dL — ABNORMAL HIGH (ref 70–99)
Potassium: 4.3 mmol/L (ref 3.5–5.1)
Potassium: 4.1 mmol/L (ref 3.5–5.1)
Sodium: 122 mmol/L — ABNORMAL LOW (ref 135–145)
Sodium: 123 mmol/L — ABNORMAL LOW (ref 135–145)

## 2023-11-11 LAB — CORTISOL: Cortisol, Plasma: 7.3 ug/dL

## 2023-11-11 LAB — LIPASE, BLOOD: Lipase: 38 U/L (ref 11–51)

## 2023-11-11 LAB — SODIUM, URINE, RANDOM: Sodium, Ur: 114 mmol/L

## 2023-11-11 LAB — GLUCOSE, CAPILLARY: Glucose-Capillary: 121 mg/dL — ABNORMAL HIGH (ref 70–99)

## 2023-11-11 LAB — TSH: TSH: 0.596 u[IU]/mL (ref 0.350–4.500)

## 2023-11-11 LAB — PHOSPHORUS: Phosphorus: 3.1 mg/dL (ref 2.5–4.6)

## 2023-11-11 LAB — MAGNESIUM: Magnesium: 1.9 mg/dL (ref 1.7–2.4)

## 2023-11-11 LAB — OSMOLALITY, URINE: Osmolality, Ur: 584 mosm/kg (ref 300–900)

## 2023-11-11 MED ORDER — PANTOPRAZOLE SODIUM 40 MG PO TBEC
40.0000 mg | DELAYED_RELEASE_TABLET | Freq: Two times a day (BID) | ORAL | Status: DC
  Administered 2023-11-12 – 2023-11-13 (×3): 40 mg via ORAL
  Filled 2023-11-11 (×3): qty 1

## 2023-11-11 MED ORDER — SODIUM CHLORIDE 0.9 % IV SOLN: INTRAVENOUS | Status: DC

## 2023-11-11 MED ORDER — SODIUM CHLORIDE 0.9 % IV SOLN: Freq: Once | INTRAVENOUS | Status: AC

## 2023-11-11 MED ORDER — ACETAMINOPHEN 650 MG RE SUPP: 650.0000 mg | Freq: Four times a day (QID) | RECTAL | Status: DC | PRN

## 2023-11-11 MED ORDER — SODIUM CHLORIDE 0.9% FLUSH
3.0000 mL | Freq: Two times a day (BID) | INTRAVENOUS | Status: DC
  Administered 2023-11-12 – 2023-11-13 (×2): 3 mL via INTRAVENOUS

## 2023-11-11 MED ORDER — GLYCOPYRROLATE 1 MG PO TABS: 2.0000 mg | ORAL_TABLET | Freq: Three times a day (TID) | ORAL | Status: DC | PRN

## 2023-11-11 MED ORDER — TAMSULOSIN HCL 0.4 MG PO CAPS
0.4000 mg | ORAL_CAPSULE | Freq: Every day | ORAL | Status: DC
  Administered 2023-11-11 – 2023-11-12 (×2): 0.4 mg via ORAL
  Filled 2023-11-11 (×2): qty 1

## 2023-11-11 MED ORDER — SODIUM CHLORIDE 0.9% FLUSH: 3.0000 mL | INTRAVENOUS | Status: DC | PRN

## 2023-11-11 MED ORDER — GABAPENTIN 800 MG PO TABS: 800.0000 mg | ORAL_TABLET | Freq: Three times a day (TID) | ORAL | Status: DC | PRN

## 2023-11-11 MED ORDER — ACETAMINOPHEN 325 MG PO TABS
650.0000 mg | ORAL_TABLET | Freq: Four times a day (QID) | ORAL | Status: DC | PRN
  Administered 2023-11-12: 650 mg via ORAL
  Filled 2023-11-11: qty 2

## 2023-11-11 MED ORDER — GABAPENTIN 400 MG PO CAPS
800.0000 mg | ORAL_CAPSULE | Freq: Three times a day (TID) | ORAL | Status: DC | PRN
  Administered 2023-11-12: 800 mg via ORAL
  Filled 2023-11-11: qty 2

## 2023-11-11 MED ORDER — MELATONIN 5 MG PO TABS
5.0000 mg | ORAL_TABLET | Freq: Every evening | ORAL | Status: DC | PRN
  Administered 2023-11-11: 5 mg via ORAL
  Filled 2023-11-11: qty 1

## 2023-11-11 MED ORDER — METOPROLOL SUCCINATE ER 25 MG PO TB24
12.5000 mg | ORAL_TABLET | Freq: Every day | ORAL | Status: DC
  Administered 2023-11-11 – 2023-11-12 (×2): 12.5 mg via ORAL
  Filled 2023-11-11 (×3): qty 1

## 2023-11-11 MED ORDER — INSULIN ASPART 100 UNIT/ML IJ SOLN
0.0000 [IU] | Freq: Three times a day (TID) | INTRAMUSCULAR | Status: DC
  Administered 2023-11-12 (×2): 1 [IU] via SUBCUTANEOUS

## 2023-11-11 MED ORDER — ONDANSETRON HCL 4 MG/2ML IJ SOLN
4.0000 mg | Freq: Once | INTRAMUSCULAR | Status: AC
Start: 2023-11-11 — End: 2023-11-11
  Administered 2023-11-11: 4 mg via INTRAVENOUS
  Filled 2023-11-11: qty 2

## 2023-11-11 MED ORDER — INSULIN GLARGINE 100 UNIT/ML ~~LOC~~ SOLN
30.0000 [IU] | Freq: Every day | SUBCUTANEOUS | Status: DC
Start: 2023-11-11 — End: 2023-11-13
  Administered 2023-11-11 – 2023-11-12 (×2): 30 [IU] via SUBCUTANEOUS
  Filled 2023-11-11 (×3): qty 0.3

## 2023-11-11 MED ORDER — SODIUM CHLORIDE 0.9 % IV SOLN: 250.0000 mL | INTRAVENOUS | Status: AC | PRN

## 2023-11-11 MED ORDER — HYDRALAZINE HCL 20 MG/ML IJ SOLN: 5.0000 mg | Freq: Four times a day (QID) | INTRAMUSCULAR | Status: DC | PRN

## 2023-11-11 MED ORDER — MONTELUKAST SODIUM 10 MG PO TABS
10.0000 mg | ORAL_TABLET | Freq: Every day | ORAL | Status: DC
Start: 2023-11-11 — End: 2023-11-13
  Administered 2023-11-11 – 2023-11-12 (×2): 10 mg via ORAL
  Filled 2023-11-11 (×2): qty 1

## 2023-11-11 MED ORDER — BUPROPION HCL ER (XL) 300 MG PO TB24
300.0000 mg | ORAL_TABLET | Freq: Every day | ORAL | Status: DC
  Administered 2023-11-12 – 2023-11-13 (×2): 300 mg via ORAL
  Filled 2023-11-11 (×2): qty 1

## 2023-11-11 MED ORDER — ONDANSETRON HCL 4 MG/2ML IJ SOLN
4.0000 mg | Freq: Four times a day (QID) | INTRAMUSCULAR | Status: DC | PRN
Start: 2023-11-11 — End: 2023-11-13

## 2023-11-11 MED ORDER — ENOXAPARIN SODIUM 40 MG/0.4ML IJ SOSY
40.0000 mg | PREFILLED_SYRINGE | INTRAMUSCULAR | Status: DC
  Administered 2023-11-11 – 2023-11-12 (×2): 40 mg via SUBCUTANEOUS
  Filled 2023-11-11 (×2): qty 0.4

## 2023-11-11 MED ORDER — POLYETHYLENE GLYCOL 3350 17 G PO PACK: 17.0000 g | PACK | Freq: Every day | ORAL | Status: DC | PRN

## 2023-11-11 MED ORDER — INSULIN GLARGINE-YFGN 100 UNIT/ML ~~LOC~~ SOPN: 30.0000 [IU] | PEN_INJECTOR | Freq: Every day | SUBCUTANEOUS | Status: DC

## 2023-11-11 MED ORDER — ONDANSETRON 4 MG PO TBDP
4.0000 mg | ORAL_TABLET | Freq: Once | ORAL | Status: AC | PRN
  Administered 2023-11-11: 4 mg via ORAL
  Filled 2023-11-11: qty 1

## 2023-11-11 NOTE — H&P (Addendum)
 [facility] History and Physical  Michael Guzman QMV:[id number] DOB: January 05, 1946 DOA: 11/11/2023  Referring physician: ED  PCP: Patient, No Pcp Per   Patient is coming from: Home  Chief Complaint: Nausea, vomiting and diarrhea  HPI:  Patient is a 78 years old male with past medical history of diabetes mellitus, fatty liver, GERD, hypertension, hyperlipidemia and history of prostate cancer in the past presented to the ED at [facility] initially with complaints of nauseam, vomiting and diarrhea for 2 to 3 days. Patient denied any urinary urgency, frequency or dysuria. Denies any abdominal pain. Denies any sick contacts. He also reported decreased oral intake since he was not able to tolerate anything that he took by mouth. Denied any syncope or passing out spells. Complains of fatigue and generalized weakness. Denies any dyspnea or chest pain but has some baseline shortness of breath which is not worse than his baseline.  Denies any recent travel.    In the ED, patient was noted to be pale and dry. Initial vitals were notable for blood pressure of 143/82. Labs were notable for WBC at 6.4. Sodium was low at 119. Creatinine 0.9. Influenza COVID and RSV was negative. Urinary labs were sent from the ED. Urinalysis showed no white cells or RBC. Chest x-ray of the chest without infiltrate.  Patient was then considered for admission to the hospital for further evaluation and treatment.   Assessment and Plan Principal Problem:   Hyponatremia Active Problems:   Hypertension (1991)   GERD   Hyperlipidemia associated with type 2 diabetes mellitus (HCC)   Hypovolemia   Nausea vomiting and diarrhea   Significant hyponatremia likely acute hypovolemic hyponatremia.   Patient presented with initial sodium of 119. Likely hypovolemic hyponatremia.  Patient has generalized weakness but otherwise okay. Received normal saline 200 mL x 1 in the ED.   Urinary osmolality of 584, serum osmolality low at  264, urinary sodium pending.  Will check TSH, random cortisol.  Baseline sodium around 132-133.  Continue with gentle IV fluid hydration overnight.  Monitor BMP every 6 hourly x 2.  Hold off with Lasix from home   Nausea vomiting diarrhea.   COVID influenza and RSV negative.  Check GI pathogen panel.  Continue IV fluids antiemetics analgesics.  Check H. pylori and GI pathogen panel.  If negative will start antimotility agents.  Hold aspirin, meloxicam from home.   Volume depletion Continue with IV fluids.  Encourage oral hydration.  Monitor sodium closely.   Generalized weakness, fatigue, deconditioning.  Will get PT evaluation.   Excessive sweating.  Patient takes glycopyrrolate, gabapentin at home.  Will continue.   Diabetes mellitus type 2.  On Humulin 70/30 at home takes 30 units twice a day along with metformin.  Will put patient on long-acting insulin with Semglee 40 units at nighttime with on sliding scale insulin.  Continue Accu-Cheks. Diabetic diet when p.o. stable.   Hyperlipidemia.  On Lipitor.  Hold for now.   Hypertension.  Will closely monitor.  On metoprolol from home will resume.  DVT Prophylaxis: Lovenox subcu  Review of Systems:  All systems were reviewed and were negative unless otherwise mentioned in the HPI   Past Medical History:  Diagnosis Date   Arthritis    CAD (coronary artery disease)    a.s/p DES to mid LAD and OM2 06/2012.   Chronic back pain    Chronically dry eyes    Diabetes mellitus without complication (HCC)    TYPE 2    DKA (  diabetic ketoacidoses) 01/30/2017   Elevated PSA    being monitored   Fatty liver    GERD (gastroesophageal reflux disease)    Hx of radiation therapy    prostate , [facility]    Hypertension    Hypertriglyceridemia    Intermediate coronary syndrome (HCC) 07/26/2012   Nausea vomiting and diarrhea 11/11/2023   Neuromuscular disorder (HCC)    neuropathy feet and legs   Prostate cancer (HCC)    RADTIATION     Past Surgical History:  Procedure Laterality Date   APPENDECTOMY     CARDIAC SURGERY  ~2017   3 stent placed.   COLONOSCOPY  02/12/2010   Buccini   COLONOSCOPY WITH PROPOFOL N/A 09/21/2022   Procedure: COLONOSCOPY WITH PROPOFOL;  Surgeon: Meridee Score Netty Starring., MD;  Location: [facility];  Service: Gastroenterology;  Laterality: N/A;   ENDOSCOPIC MUCOSAL RESECTION N/A 09/21/2022   Procedure: ENDOSCOPIC MUCOSAL RESECTION;  Surgeon: Meridee Score Netty Starring., MD;  Location: [facility];  Service: Gastroenterology;  Laterality: N/A;   HEMOSTASIS CLIP PLACEMENT  09/21/2022   Procedure: HEMOSTASIS CLIP PLACEMENT;  Surgeon: Lemar Lofty., MD;  Location: [facility];  Service: Gastroenterology;;   LEFT HEART CATHETERIZATION WITH CORONARY ANGIOGRAM N/A 06/27/2012   Procedure: LEFT HEART CATHETERIZATION WITH CORONARY ANGIOGRAM;  Surgeon: Donato Schultz, MD;  Location: [facility] CATH LAB;  Service: Cardiovascular;  Laterality: N/A;   LEFT HEART CATHETERIZATION WITH CORONARY ANGIOGRAM N/A 07/25/2012   Procedure: LEFT HEART CATHETERIZATION WITH CORONARY ANGIOGRAM;  Surgeon: Quintella Reichert, MD;  Location: [facility] CATH LAB;  Service: Cardiovascular;  Laterality: N/A;   PROSTATE BIOPSY     RADIOLOGY WITH ANESTHESIA N/A 12/03/2015   Procedure: MRI LUMBAR SPINE;  Surgeon: Medication Radiologist, MD;  Location: [facility] OR;  Service: Radiology;  Laterality: N/A;   SUBMUCOSAL LIFTING INJECTION  09/21/2022   Procedure: SUBMUCOSAL LIFTING INJECTION;  Surgeon: Meridee Score Netty Starring., MD;  Location: [facility];  Service: Gastroenterology;;   TOTAL HIP ARTHROPLASTY Right 04/28/2019   Procedure: TOTAL HIP ARTHROPLASTY ANTERIOR APPROACH;  Surgeon: Jodi Geralds, MD;  Location: [facility] ORS;  Service: Orthopedics;  Laterality: Right;    Social History:  reports that he quit smoking about 12 years ago. His smoking use included cigarettes. He started smoking about 65 years ago. He has a 39.6 pack-year smoking history. He has never used  smokeless tobacco. He reports that he does not currently use alcohol. He reports that he does not use drugs.  No Known Allergies  Family History  Problem Relation Age of Onset   Diabetes Sister    Stomach cancer Neg Hx    Colon cancer Neg Hx    Esophageal cancer Neg Hx    Inflammatory bowel disease Neg Hx    Liver disease Neg Hx    Pancreatic cancer Neg Hx    Rectal cancer Neg Hx      Prior to Admission medications   Medication Sig Start Date End Date Taking? Authorizing Provider  Ascorbic Acid (VITAMIN C) 1000 MG tablet Take 1,000 mg by mouth daily.   Yes [provider]  aspirin EC 81 MG tablet Take 1 tablet (81 mg total) by mouth daily. 10/27/19  Yes Jake Bathe, MD  atorvastatin (LIPITOR) 10 MG tablet TAKE 1 TABLET BY MOUTH DAILY 06/23/23  Yes Raynelle Dick, NP  B Complex-C (SUPER B COMPLEX PO) Take 1 tablet by mouth daily.   Yes [provider]  buPROPion (WELLBUTRIN XL) 300 MG 24 hr tablet TAKE 1 TABLET BY  MOUTH DAILY FOR MOOD, FOCUS AND CONCENTRATION 06/16/23  Yes Cranford, Tonya, NP  Cholecalciferol (VITAMIN D3) 25 MCG (1000 UT) CAPS Take 10,000 Units by mouth daily. 250 mcg   Yes [provider]  gabapentin (NEURONTIN) 800 MG tablet TAKE 1 TABLET BY MOUTH 3 TO 4  TIMES DAILY FOR PAIN, HOT  FLASHES AND SWEATS 04/05/23  Yes Raynelle Dick, NP  glycopyrrolate (ROBINUL) 2 MG tablet Take  1 tablet  3 x / day for Excessive Sweating  TAKE ONE TABLET BY                                                 MOUTH 12/24/22  Yes Lucky Cowboy, MD  HUMULIN 70/30 (70-30) 100 UNIT/ML injection INJECT SUBCUTANEOUSLY 50 UNITS  TWICE DAILY Patient taking differently: Inject 30 Units into the skin 2 (two) times daily with a meal. Morning and afternoon 06/23/22  Yes Cranford, Archie Patten, NP  Magnesium 500 MG TABS Take 500 mg by mouth daily.   Yes [provider]  meloxicam (MOBIC) 15 MG tablet Take 15 mg by mouth daily. 10/27/23  Yes [provider]   metFORMIN (GLUCOPHAGE-XR) 500 MG 24 hr tablet TAKE 2 TABLETS BY MOUTH TWICE  DAILY WITH MEALS FOR DIABETES 06/23/23  Yes Raynelle Dick, NP  metoprolol succinate (TOPROL-XL) 25 MG 24 hr tablet TAKE ONE-HALF TABLET BY MOUTH  DAILY 10/19/23  Yes Jake Bathe, MD  montelukast (SINGULAIR) 10 MG tablet TAKE 1 TABLET BY MOUTH DAILY FOR ALLERGIES 04/05/23  Yes Raynelle Dick, NP  Multiple Vitamin (MULTIVITAMIN WITH MINERALS) TABS tablet Take 1 tablet by mouth daily. men   Yes [provider]  Multiple Vitamins-Minerals (ZINC PO) Take 50 mg by mouth daily.   Yes [provider]  pantoprazole (PROTONIX) 40 MG tablet TAKE 1 TABLET BY MOUTH DAILY TO  PREVENT HEARTBURN AND  INDIGESTION 04/05/23  Yes Raynelle Dick, NP  tamsulosin (FLOMAX) 0.4 MG CAPS capsule Take 0.4 mg by mouth at bedtime.   Yes [provider]  Vitamin E 450 MG (1000 UT) CAPS Take 450 mg by mouth daily.   Yes [provider]  Blood Glucose Monitoring Suppl (ONE TOUCH ULTRA 2) w/Device KIT Use as directed 03/12/22   Raynelle Dick, NP  furosemide (LASIX) 40 MG tablet Take 1 tablet 2 x /day as needed  for Fluid Retention Patient not taking: Reported on 08/17/2023 01/20/22   Lucky Cowboy, MD  glucose blood (ONETOUCH ULTRA) test strip CHECK BLOOD SUGAR 3 TIMES  DAILY 11/27/21   Raynelle Dick, NP  Insulin Syringes, Disposable, U-100 1 ML MISC 50 Units by Does not apply route 2 (two) times daily. inject 50 units into skin 2 x /day 07/23/20   Lucky Cowboy, MD  Lancets [facility] DELICA PLUS [place]) MISC CHECK BLOOD SUGAR 3 TIMES  DAILY 07/09/22   Lucky Cowboy, MD  nitroGLYCERIN (NITROSTAT) 0.4 MG SL tablet PLACE 1 TABLET UNDER THE TONGUE EVERY 5 MINUTES AS NEEDED FOR CHEST PAIN 08/31/22   Raynelle Dick, NP  ondansetron (ZOFRAN) 8 MG tablet Take 1 tablet (8 mg total) by mouth every 8 (eight) hours as needed for nausea or vomiting. Patient not taking: Reported on 11/11/2023 06/16/22    Mansouraty, Netty Starring., MD    Physical Exam:  Vitals:   11/11/23 1540 11/11/23 1600 11/11/23  1725 11/11/23 1726  BP: (!) 152/93 (!) 152/99  (!) 171/94  Pulse: 68 74  67  Resp: 15 14  20   Temp: 97.9 F (36.6 C)   98.9 F (37.2 C)  TempSrc: Oral   Oral  SpO2: 100% 100%  100%  Weight:   86.1 kg   Height:   5\' 10"  (1.778 m)    Wt Readings from Last 3 Encounters:  11/11/23 86.1 kg  10/12/23 91 kg  08/17/23 91.6 kg   Body mass index is 27.24 kg/m.  General:  Average built, not in obvious distress HENT: Normocephalic, No scleral pallor or icterus noted. Oral mucosa is slightly dry Chest:  Clear breath sounds.  No crackles or wheezes.  CVS: S1 &S2 heard. No murmur.  Regular rate and rhythm. Abdomen: Soft, nontender, nondistended.  Bowel sounds are heard. No abdominal mass palpated Extremities: No cyanosis, clubbing or edema.  Peripheral pulses are palpable. Psych: Alert, awake and oriented, normal mood CNS:  No cranial nerve deficits.  Power equal in all extremities.   Skin: Warm and dry.  Diminished skin turgor  Labs on Admission:   CBC: Recent Labs  Lab 11/11/23 0930  WBC 6.4  HGB 13.8  HCT 38.4*  MCV 82.9  PLT 238    Basic Metabolic Panel: Recent Labs  Lab 11/11/23 0930 11/11/23 1730  NA 119* 122*  K 4.7 4.3  CL 85* 89*  CO2 23 23  GLUCOSE 177* 138*  BUN 8 8  CREATININE 0.90 0.80  CALCIUM 10.2 9.3  MG 1.9  --   PHOS 3.1  --     Liver Function Tests: Recent Labs  Lab 11/11/23 0930  AST 27  ALT 23  ALKPHOS 63  BILITOT 0.7  PROT 8.3*  ALBUMIN 5.2*   Recent Labs  Lab 11/11/23 0930  LIPASE 38   No results for input(s): "AMMONIA" in the last 168 hours.  Cardiac Enzymes: No results for input(s): "CKTOTAL", "CKMB", "CKMBINDEX", "TROPONINI" in the last 168 hours.  BNP (last 3 results) No results for input(s): "BNP" in the last 8760 hours.  ProBNP (last 3 results) No results for input(s): "PROBNP" in the last 8760 hours.  CBG: No results  for input(s): "GLUCAP" in the last 168 hours.  Lipase     Component Value Date/Time   LIPASE 38 11/11/2023 0930     Urinalysis    Component Value Date/Time   COLORURINE YELLOW 11/11/2023 1418   APPEARANCEUR CLEAR 11/11/2023 1418   LABSPEC 1.019 11/11/2023 1418   LABSPEC 1.020 10/14/2016 1230   PHURINE 7.0 11/11/2023 1418   GLUCOSEU 100 (A) 11/11/2023 1418   GLUCOSEU 2,000 10/14/2016 1230   HGBUR NEGATIVE 11/11/2023 1418   BILIRUBINUR NEGATIVE 11/11/2023 1418   BILIRUBINUR Negative 10/14/2016 1230   KETONESUR 15 (A) 11/11/2023 1418   PROTEINUR 30 (A) 11/11/2023 1418   UROBILINOGEN 0.2 10/14/2016 1230   NITRITE NEGATIVE 11/11/2023 1418   LEUKOCYTESUR NEGATIVE 11/11/2023 1418   LEUKOCYTESUR Negative 10/14/2016 1230     Drugs of Abuse     Component Value Date/Time   LABOPIA NONE DETECTED 01/11/2021 1835   COCAINSCRNUR NONE DETECTED 01/11/2021 1835   LABBENZ NONE DETECTED 01/11/2021 1835   AMPHETMU NONE DETECTED 01/11/2021 1835   THCU NONE DETECTED 01/11/2021 1835   LABBARB NONE DETECTED 01/11/2021 1835      Radiological Exams on Admission: No results found.  EKG: Personally reviewed by me which shows normal sinus rhythm   Consultant: None  Code Status: Full code  Microbiology C. difficile and GI pathogen panel  Antibiotics: None  Family Communication:  Patients' condition and plan of care including tests being ordered have been discussed with the patient and the patient's son at bedside who indicate understanding and agree with the plan.   Status is: Inpatient   Severity of Illness: The appropriate patient status for this patient is INPATIENT. Inpatient status is judged to be reasonable and necessary in order to provide the required intensity of service to ensure the patient's safety. The patient's presenting symptoms, physical exam findings, and initial radiographic and laboratory data in the context of their chronic comorbidities is felt to place them at  high risk for further clinical deterioration. Furthermore, it is not anticipated that the patient will be medically stable for discharge from the hospital within 2 midnights of admission.   * I certify that at the point of admission it is my clinical judgment that the patient will require inpatient hospital care spanning beyond 2 midnights from the point of admission due to high intensity of service, high risk for further deterioration and high frequency of surveillance required.*  Signed, Joycelyn Das, MD [facility] 11/11/2023

## 2023-11-11 NOTE — ED Notes (Signed)
 [facility] called for transport

## 2023-11-11 NOTE — Plan of Care (Signed)

## 2023-11-11 NOTE — ED Triage Notes (Signed)
 Pt c/o NVD x2-3 days. Denies known sick contact. Denies abd pain, urinary complaint.

## 2023-11-11 NOTE — Plan of Care (Signed)
  Problem: Education: Goal: Knowledge of General Education information will improve Description: Including pain rating scale, medication(s)/side effects and non-pharmacologic comfort measures 11/11/2023 2155 by Rockne Menghini, RN Outcome: Progressing 11/11/2023 2154 by Rockne Menghini, RN Outcome: Progressing   Problem: Health Behavior/Discharge Planning: Goal: Ability to manage health-related needs will improve 11/11/2023 2155 by Rockne Menghini, RN Outcome: Progressing 11/11/2023 2154 by Rockne Menghini, RN Outcome: Progressing   Problem: Clinical Measurements: Goal: Ability to maintain clinical measurements within normal limits will improve 11/11/2023 2155 by Rockne Menghini, RN Outcome: Progressing 11/11/2023 2154 by Rockne Menghini, RN Outcome: Progressing Goal: Will remain free from infection 11/11/2023 2155 by Rockne Menghini, RN Outcome: Progressing 11/11/2023 2154 by Rockne Menghini, RN Outcome: Progressing Goal: Diagnostic test results will improve Outcome: Progressing

## 2023-11-11 NOTE — ED Provider Notes (Signed)
 [facility] EMERGENCY DEPARTMENT AT [facility] Provider Note   CSN: [id number] Arrival date & time: 11/11/23  4098     History  Chief Complaint  Patient presents with   Nausea   Emesis   Diarrhea    Michael Guzman is a 78 y.o. male.  HPI Patient reports he has had vomiting and diarrhea for a number of days.  He reports he has not been able to eat anything for several days.  He reports anything he tries to eat or drink causes nausea or vomiting.  Reports if he eats or drinks he also ends up getting diarrhea.  This has been going on for about for 5 days now.  Patient's family members present as well.  Patient has not exhibited any atypical confusion.  He has not had any syncopal episode or falls.  He has been extremely fatigued and not up or doing much for the past several days.  Patient denies he has any pain.  He denies chest pain or abdominal pain.  He reports he chronically has some shortness of breath and does not note that it is any worse than baseline.    Home Medications Prior to Admission medications   Medication Sig Start Date End Date Taking? Authorizing Provider  meloxicam (MOBIC) 15 MG tablet Take 15 mg by mouth daily. 10/27/23  Yes [provider]  Ascorbic Acid (VITAMIN C) 1000 MG tablet Take 1,000 mg by mouth daily.    [provider]  aspirin EC 81 MG tablet Take 1 tablet (81 mg total) by mouth daily. 10/27/19   Jake Bathe, MD  atorvastatin (LIPITOR) 10 MG tablet TAKE 1 TABLET BY MOUTH DAILY 06/23/23   Raynelle Dick, NP  B Complex-C (SUPER B COMPLEX PO) Take 1 tablet by mouth daily.    [provider]  Blood Glucose Monitoring Suppl (ONE TOUCH ULTRA 2) w/Device KIT Use as directed 03/12/22   Raynelle Dick, NP  buPROPion (WELLBUTRIN XL) 300 MG 24 hr tablet TAKE 1 TABLET BY MOUTH DAILY FOR MOOD, FOCUS AND CONCENTRATION 06/16/23   Adela Glimpse, NP  Cholecalciferol (VITAMIN D3) 25 MCG (1000 UT) CAPS Take 1,000 Units by mouth  daily.    [provider]  furosemide (LASIX) 40 MG tablet Take 1 tablet 2 x /day as needed  for Fluid Retention Patient not taking: Reported on 08/17/2023 01/20/22   Lucky Cowboy, MD  gabapentin (NEURONTIN) 800 MG tablet TAKE 1 TABLET BY MOUTH 3 TO 4  TIMES DAILY FOR PAIN, HOT  FLASHES AND SWEATS 04/05/23   Raynelle Dick, NP  glucose blood (ONETOUCH ULTRA) test strip CHECK BLOOD SUGAR 3 TIMES  DAILY 11/27/21   Raynelle Dick, NP  glycopyrrolate (ROBINUL) 2 MG tablet Take  1 tablet  3 x / day for Excessive Sweating  TAKE ONE TABLET BY                                                 MOUTH 12/24/22   Lucky Cowboy, MD  HUMULIN 70/30 (70-30) 100 UNIT/ML injection INJECT SUBCUTANEOUSLY 50 UNITS  TWICE DAILY Patient taking differently: Inject 40 Units into the skin 2 (two) times daily with a meal. 06/23/22   Cranford, Archie Patten, NP  Insulin Syringes, Disposable, U-100 1 ML MISC 50 Units by Does not apply route 2 (two) times daily.  inject 50 units into skin 2 x /day 07/23/20   Lucky Cowboy, MD  Lancets [facility] DELICA PLUS [place]) MISC CHECK BLOOD SUGAR 3 TIMES  DAILY 07/09/22   Lucky Cowboy, MD  latanoprost (XALATAN) 0.005 % ophthalmic solution Place 1 drop into both eyes at bedtime. 09/02/20   [provider]  Magnesium 500 MG TABS Take 500 mg by mouth daily.    [provider]  metFORMIN (GLUCOPHAGE-XR) 500 MG 24 hr tablet TAKE 2 TABLETS BY MOUTH TWICE  DAILY WITH MEALS FOR DIABETES 06/23/23   Raynelle Dick, NP  metoprolol succinate (TOPROL-XL) 25 MG 24 hr tablet TAKE ONE-HALF TABLET BY MOUTH  DAILY 10/19/23   Jake Bathe, MD  montelukast (SINGULAIR) 10 MG tablet TAKE 1 TABLET BY MOUTH DAILY FOR ALLERGIES 04/05/23   Raynelle Dick, NP  Multiple Vitamin (MULTIVITAMIN WITH MINERALS) TABS tablet Take 1 tablet by mouth daily. men    [provider]  Multiple Vitamins-Minerals (ZINC PO) Take 50 mg by mouth daily.    [provider]   nitroGLYCERIN (NITROSTAT) 0.4 MG SL tablet PLACE 1 TABLET UNDER THE TONGUE EVERY 5 MINUTES AS NEEDED FOR CHEST PAIN 08/31/22   Raynelle Dick, NP  Omega-3 Fatty Acids (FISH OIL) 1200 MG CAPS Take 1,200 mg by mouth daily.    [provider]  ondansetron (ZOFRAN) 8 MG tablet Take 1 tablet (8 mg total) by mouth every 8 (eight) hours as needed for nausea or vomiting. 06/16/22   Mansouraty, Netty Starring., MD  pantoprazole (PROTONIX) 40 MG tablet TAKE 1 TABLET BY MOUTH DAILY TO  PREVENT HEARTBURN AND  INDIGESTION 04/05/23   Raynelle Dick, NP  Polyethyl Glycol-Propyl Glycol (SYSTANE) 0.4-0.3 % SOLN Place 2 drops into both eyes daily as needed (for dry eyes).    [provider]  tamsulosin (FLOMAX) 0.4 MG CAPS capsule Take 0.4 mg by mouth at bedtime.    [provider]  Vitamin E 400 units TABS Take 400 Units by mouth daily.    [provider]      Allergies    Patient has no known allergies.    Review of Systems   Review of Systems  Physical Exam Updated Vital Signs BP (!) 143/82 (BP Location: Left Arm)   Pulse 79   Temp 98.4 F (36.9 C)   Resp 18   SpO2 100%  Physical Exam Constitutional:      Comments: Patient is alert.  No respiratory distress.  Nontoxic.  HENT:     Head: Normocephalic and atraumatic.     Mouth/Throat:     Mouth: Mucous membranes are dry.     Pharynx: Oropharynx is clear.  Eyes:     Extraocular Movements: Extraocular movements intact.     Pupils: Pupils are equal, round, and reactive to light.  Cardiovascular:     Rate and Rhythm: Normal rate and regular rhythm.  Pulmonary:     Effort: Pulmonary effort is normal.     Breath sounds: Normal breath sounds.  Abdominal:     General: There is no distension.     Palpations: Abdomen is soft.     Tenderness: There is no abdominal tenderness. There is no guarding.  Musculoskeletal:     Right lower leg: No edema.     Left lower leg: No edema.     Comments: No peripheral edema.   Calves are soft and nontender.  Symmetric lower extremities.  Skin:    General: Skin is warm and  dry.     Coloration: Skin is pale.  Neurological:     General: No focal deficit present.     Mental Status: He is oriented to person, place, and time.     Motor: No weakness.     Coordination: Coordination normal.     Comments: Patient is not ambulated.  He is examined in bed.  No focal motor deficits evident.  Psychiatric:        Mood and Affect: Mood normal.     ED Results / Procedures / Treatments   Labs (all labs ordered are listed, but only abnormal results are displayed) Labs Reviewed  COMPREHENSIVE METABOLIC PANEL - Abnormal; Notable for the following components:      Result Value   Sodium 119 (*)    Chloride 85 (*)    Glucose, Bld 177 (*)    Total Protein 8.3 (*)    Albumin 5.2 (*)    All other components within normal limits  CBC - Abnormal; Notable for the following components:   HCT 38.4 (*)    All other components within normal limits  RESP PANEL BY RT-PCR (RSV, FLU A&B, COVID)  [id number]  LIPASE, BLOOD  URINALYSIS, ROUTINE W REFLEX MICROSCOPIC  MAGNESIUM  PHOSPHORUS    EKG EKG Interpretation Date/Time:  Thursday November 11 2023 12:06:21 EST Ventricular Rate:  65 PR Interval:  216 QRS Duration:  152 QT Interval:  430 QTC Calculation: 448 R Axis:   -6  Text Interpretation: Sinus rhythm Borderline prolonged PR interval IVCD, consider atypical RBBB Abnormal inferior Q waves when compared to prior, slower rate No STEMI Confirmed by Theda Belfast ([id number]) on 11/11/2023 4:34:58 PM  Radiology No results found.  Procedures Procedures    Medications Ordered in ED Medications  0.9 %  sodium chloride infusion (has no administration in time range)  ondansetron (ZOFRAN-ODT) disintegrating tablet 4 mg (4 mg Oral Given 11/11/23 0932)    ED Course/ Medical Decision Making/ A&P                                 Medical Decision Making Amount and/or Complexity of  Data Reviewed Labs: ordered.  Risk Prescription drug management. Decision regarding hospitalization.   Patient presents as outlined.  He has had at least 3 days of loss of appetite, vomiting and diarrhea.  No syncopal episodes.  Patient does not endorse any focal pain.  Triage labs have returned with a sodium at 119.  Patient's GFR is normal.  Will initiate volume replacement with normal saline at 200 cc/h.  Patient does not have hypotension or tachycardia.  No syncope.  At this time stable for gradual correction of hyponatremia.  Consult: Reviewed to Dr. Erenest Blank for admission.        Final Clinical Impression(s) / ED Diagnoses Final diagnoses:  Hyponatremia  Loss of appetite  Nausea vomiting and diarrhea    Rx / DC Orders ED Discharge Orders     None         Arby Barrette, MD 11/15/23 1605

## 2023-11-11 NOTE — ED Notes (Signed)
 Attempted to call report x1. Will try back. Jillyn Hidden, RN

## 2023-11-11 NOTE — Hospital Course (Signed)
 Patient is a 78 years old male with past medical history of diabetes mellitus, fatty liver, GERD, hypertension, hyperlipidemia and history of prostate cancer in the past presented to the hospital at [facility] initially with complaints of nausea vomiting and diarrhea for 2 to 3 days.  Patient denied any urinary urgency, frequency or dysuria.  Denies any abdominal pain.  Denies any sick contacts.  He also reported decreased oral intake which caused him to have nausea and vomiting.  Denied any syncope or passing out spells.  Complains of fatigue and generalized weakness.  Denies any dyspnea or chest pain but has some baseline shortness of breath which is not worse than his baseline.  In the ED patient was noted to be pale and dry.  Initial vitals were notable for blood pressure of 143/82.  Labs were notable for WBC at 6.4.  Sodium was low at 119.  Creatinine 0.9.  Influenza COVID and RSV was negative.  Urinary labs were sent from the ED.  Urinalysis showed no white cells or RBC.  Chest x-ray done in the ED report pending   Significant hyponatremia.  Patient has generalized weakness but otherwise okay.  Likely secondary to volume depletion.  Patient received normal saline 200 mL x 1 in the ED.  Urinary labs have been sent for hyponatremia including serum and urine osmolality and urinary sodium.  Will check TSH, random cortisol.  Nausea vomiting diarrhea.  COVID influenza and RSV negative.  Check GI pathogen panel.  Continue IV fluids antiemetics analgesics.  Check H. pylori and GI pathogen panel.  If negative will start antimotility agents.  Volume depletion, dehydration.  Continue with IV fluids.  Encourage oral hydration.  Generalized weakness fatigue deconditioning.  Will get PT OT evaluation.  Diabetes mellitus type 2.  Will put the patient on sliding scale insulin Accu-Cheks diabetic diet when p.o. stable.  Hypertension.  Will closely monitor.  Will put the patient on IV hydralazine for now.

## 2023-11-12 ENCOUNTER — Inpatient Hospital Stay (HOSPITAL_COMMUNITY): Payer: Medicare Other

## 2023-11-12 DIAGNOSIS — E871 Hypo-osmolality and hyponatremia: Secondary | ICD-10-CM | POA: Diagnosis not present

## 2023-11-12 LAB — MAGNESIUM: Magnesium: 1.9 mg/dL (ref 1.7–2.4)

## 2023-11-12 LAB — CBC
HCT: 38.4 % — ABNORMAL LOW (ref 39.0–52.0)
Hemoglobin: 12.9 g/dL — ABNORMAL LOW (ref 13.0–17.0)
MCH: 29.1 pg (ref 26.0–34.0)
MCHC: 33.6 g/dL (ref 30.0–36.0)
MCV: 86.5 fL (ref 80.0–100.0)
Platelets: 210 10*3/uL (ref 150–400)
RBC: 4.44 MIL/uL (ref 4.22–5.81)
RDW: 12.1 % (ref 11.5–15.5)
WBC: 6 10*3/uL (ref 4.0–10.5)
nRBC: 0 % (ref 0.0–0.2)

## 2023-11-12 LAB — BASIC METABOLIC PANEL
Anion gap: 8 (ref 5–15)
BUN: 8 mg/dL (ref 8–23)
CO2: 25 mmol/L (ref 22–32)
Calcium: 9.2 mg/dL (ref 8.9–10.3)
Chloride: 90 mmol/L — ABNORMAL LOW (ref 98–111)
Creatinine, Ser: 0.77 mg/dL (ref 0.61–1.24)
GFR, Estimated: >60 mL/min (ref >60)
Glucose, Bld: 132 mg/dL — ABNORMAL HIGH (ref 70–99)
Potassium: 3.9 mmol/L (ref 3.5–5.1)
Sodium: 123 mmol/L — ABNORMAL LOW (ref 135–145)

## 2023-11-12 LAB — SODIUM
Sodium: 122 mmol/L — ABNORMAL LOW (ref 135–145)
Sodium: 123 mmol/L — ABNORMAL LOW (ref 135–145)
Sodium: 125 mmol/L — ABNORMAL LOW (ref 135–145)

## 2023-11-12 LAB — GLUCOSE, CAPILLARY
Glucose-Capillary: 109 mg/dL — ABNORMAL HIGH (ref 70–99)
Glucose-Capillary: 132 mg/dL — ABNORMAL HIGH (ref 70–99)
Glucose-Capillary: 141 mg/dL — ABNORMAL HIGH (ref 70–99)
Glucose-Capillary: 89 mg/dL (ref 70–99)
Glucose-Capillary: 151 mg/dL — ABNORMAL HIGH (ref 70–99)

## 2023-11-12 LAB — C DIFFICILE QUICK SCREEN W PCR REFLEX
C Diff antigen: NEGATIVE
C Diff interpretation: NOT DETECTED
C Diff toxin: NEGATIVE

## 2023-11-12 MED ORDER — SODIUM CHLORIDE 0.9 % IV SOLN: INTRAVENOUS | Status: DC

## 2023-11-12 MED ORDER — TRAMADOL HCL 50 MG PO TABS
50.0000 mg | ORAL_TABLET | Freq: Four times a day (QID) | ORAL | Status: DC | PRN
  Filled 2023-11-12: qty 1

## 2023-11-12 NOTE — Plan of Care (Signed)
  Problem: Clinical Measurements: Goal: Respiratory complications will improve Outcome: Progressing   Problem: Clinical Measurements: Goal: Cardiovascular complication will be avoided Outcome: Progressing   Problem: Clinical Measurements: Goal: Will remain free from infection Outcome: Progressing   Problem: Clinical Measurements: Goal: Diagnostic test results will improve Outcome: Progressing

## 2023-11-12 NOTE — TOC Initial Note (Signed)
 Transition of Care [facility]) - Initial/Assessment Note    Patient Details  Name: Michael Guzman MRN: [id number] Date of Birth: [birth year]  Transition of Care [facility]) CM/SW Contact:    Larrie Kass, LCSW Phone Number: 11/12/2023, 11:48 AM  Clinical Narrative:                 CSW met with the pt at bedside to discuss recommendations for a rolling walker. The pt is agreeable and has no preferences regarding the DME agency. CSW sent a referral to [employer] for the walker to be delivered to p's room prior to d/c. The pt reports no transportation needs, as he stated his son will transport him home upon d/c.  Expected Discharge Plan: Home/Self Care Barriers to Discharge: Continued Medical Work up   Patient Goals and CMS Choice Patient states their goals for this hospitalization and ongoing recovery are:: return home CMS Medicare.gov Compare Post Acute Care list provided to:: Patient Choice offered to / list presented to : Patient      Expected Discharge Plan and Services       Living arrangements for the past 2 months: Single Family Home                 DME Arranged: Dan Humphreys DME Agency: [employer]                  Prior Living Arrangements/Services Living arrangements for the past 2 months: Single Family Home Lives with:: Self Patient language and need for interpreter reviewed:: Yes Do you feel safe going back to the place where you live?: Yes      Need for Family Participation in Patient Care: No (Comment) Care giver support system in place?: No (comment)   Criminal Activity/Legal Involvement Pertinent to Current Situation/Hospitalization: No - Comment as needed  Activities of Daily Living   ADL Screening (condition at time of admission) Independently performs ADLs?: Yes (appropriate for developmental age) Is the patient deaf or have difficulty hearing?: No Does the patient have difficulty seeing, even when wearing glasses/contacts?: No Does the  patient have difficulty concentrating, remembering, or making decisions?: No  Permission Sought/Granted                  Emotional Assessment Appearance:: Appears stated age Attitude/Demeanor/Rapport: Gracious Affect (typically observed): Accepting Orientation: : Oriented to Self, Oriented to Place, Oriented to  Time, Oriented to Situation   Psych Involvement: No (comment)  Admission diagnosis:  Hyponatremia [E87.1] Patient Active Problem List   Diagnosis Date Noted   Hyponatremia 11/11/2023   Hypovolemia 11/11/2023   Nausea vomiting and diarrhea 11/11/2023   Adenomatous polyp of transverse colon 06/18/2022   Hx of adenomatous colonic polyps 06/18/2022   Post colonoscopy pain nausea and vomiting 06/18/2022   Abnormal colonoscopy 06/18/2022   Diaphoresis 08/04/2021   Inadequate sleep hygiene 08/04/2021   Hypersomnia with sleep apnea 08/04/2021   Excessive daytime sleepiness 08/04/2021   Excessive postexertional fatigue 08/04/2021   Diabetes mellitus with coincident hypertension (HCC) 06/17/2021   AMS (altered mental status) 01/11/2021   Hypoglycemia due to insulin 01/11/2021   Acute encephalopathy    Hypothermia    Primary osteoarthritis of hip 04/30/2019   Former smoker 02/02/2019   Fatty infiltration of liver 11/30/2018   Hypertriglyceridemia    Microalbuminuric diabetic nephropathy (HCC) 06/01/2018   Aortic atherosclerosis (HCC) by Abd CT scan 2015 05/30/2018   Type 2 diabetes mellitus with vascular disease (HCC) 01/31/2018   Obesity (BMI 30.0-34.9) 11/03/2017  Thyroiditis 05/12/2016   Malignant neoplasm of prostate (HCC) 05/05/2016   Diabetic peripheral neuropathy (HCC) 03/06/2015   Medication management 07/24/2014   Vitamin D deficiency 03/06/2014   Hyperlipidemia associated with type 2 diabetes mellitus (HCC) 06/28/2012   ASCAD s/p PTCA (06/2012) 06/27/2012   Hypertension (1991) 06/25/2012   GERD 06/25/2012   PCP:  Patient, No Pcp Per Pharmacy:    [id number] - [place] - [address] [place] [address] Phone: [phone] Fax: [phone]  [facility] Delivery) - [place], [place] - [address] [facility] [address] [place] [address] [place] [address] Phone: [phone] Fax: [phone]  [facility] Delivery - [place], [place] - [address] [place] [address] Phone: [phone] Fax: [phone]  [facility] # [address] [place] - [address] [place] [address] Phone: [phone] Fax: [phone]     Social Drivers of Health (SDOH) Social History: SDOH Screenings   Food Insecurity: No Food Insecurity (11/11/2023)  Housing: Low Risk  (11/11/2023)  Transportation Needs: No Transportation Needs (11/11/2023)  Utilities: Not At Risk (11/11/2023)  Depression (PHQ2-9): Low Risk  (05/09/2023)  Social Connections: Patient Declined (11/11/2023)  Tobacco Use: Medium Risk (11/11/2023)   SDOH Interventions:     Readmission Risk Interventions     No data to display

## 2023-11-12 NOTE — Evaluation (Signed)
 Physical Therapy Evaluation Patient Details Name: Michael Guzman MRN: [id number] DOB: 01/27/1946 Today's Date: 11/12/2023  History of Present Illness  78 years old male presented to the ED at [facility] initially with complaints of nausea, vomiting and diarrhea for 2 to 3 days. Dx of hyponatremia. Pt with past medical history of diabetes mellitus, fatty liver, GERD, hypertension, hyperlipidemia and prostate cancer.  Clinical Impression  Pt admitted with above diagnosis. Pt ambulated 120' with RW without loss of balance, he reported BLEs, "feel weak" while walking, no buckling noted. Good progress expected.  Pt currently with functional limitations due to the deficits listed below (see PT Problem List). Pt will benefit from acute skilled PT to increase their independence and safety with mobility to allow discharge.           If plan is discharge home, recommend the following: Assistance with cooking/housework;Assist for transportation;Help with stairs or ramp for entrance   Can travel by private vehicle        Equipment Recommendations Rolling walker (2 wheels)  Recommendations for Other Services       Functional Status Assessment Patient has had a recent decline in their functional status and demonstrates the ability to make significant improvements in function in a reasonable and predictable amount of time.     Precautions / Restrictions Precautions Precautions: Fall Precaution/Restrictions Comments: pt denies falls in past 6 months Restrictions Weight Bearing Restrictions Per Provider Order: No      Mobility  Bed Mobility               General bed mobility comments: up in recliner    Transfers Overall transfer level: Independent                      Ambulation/Gait Ambulation/Gait assistance: Supervision Gait Distance (Feet): 120 Feet Assistive device: Rolling walker (2 wheels) Gait Pattern/deviations: Step-through pattern, Decreased stride  length Gait velocity: decr but WFL     General Gait Details: steady, no loss of balance, distance limited by BLEs "feeling weak"  Stairs            Wheelchair Mobility     Tilt Bed    Modified Rankin (Stroke Patients Only)       Balance Overall balance assessment: Modified Independent (RN stated pt was unsteady without RW with bed to recliner transfer)                                           Pertinent Vitals/Pain Pain Assessment Pain Assessment: No/denies pain    Home Living Family/patient expects to be discharged to:: Private residence Living Arrangements: Children Available Help at Discharge: Family;Available 24 hours/day   Home Access: Stairs to enter Entrance Stairs-Rails: Can reach both;Left;Right Entrance Stairs-Number of Steps: 3   Home Layout: One level Home Equipment: Cane - single point Additional Comments: son just moved in    Prior Function Prior Level of Function : Independent/Modified Independent             Mobility Comments: denies falls in past 6 months, uses cane once in awhile       Extremity/Trunk Assessment   Upper Extremity Assessment Upper Extremity Assessment: Overall WFL for tasks assessed    Lower Extremity Assessment Lower Extremity Assessment: Overall WFL for tasks assessed    Cervical / Trunk Assessment Cervical / Trunk Assessment: Normal  Communication  Communication Communication: No apparent difficulties    Cognition Arousal: Alert Behavior During Therapy: WFL for tasks assessed/performed   PT - Cognitive impairments: No apparent impairments                         Following commands: Intact       Cueing       General Comments      Exercises     Assessment/Plan    PT Assessment Patient needs continued PT services  PT Problem List Decreased activity tolerance;Decreased mobility;Decreased balance       PT Treatment Interventions Gait training;Therapeutic  exercise;Balance training    PT Goals (Current goals can be found in the Care Plan section)  Acute Rehab PT Goals Patient Stated Goal: get home to his mini Schnauzer puppy Monty PT Goal Formulation: With patient Time For Goal Achievement: 11/26/23 Potential to Achieve Goals: Good    Frequency Min 1X/week     Co-evaluation               AM-PAC PT "6 Clicks" Mobility  Outcome Measure Help needed turning from your back to your side while in a flat bed without using bedrails?: None Help needed moving from lying on your back to sitting on the side of a flat bed without using bedrails?: A Little Help needed moving to and from a bed to a chair (including a wheelchair)?: A Little Help needed standing up from a chair using your arms (e.g., wheelchair or bedside chair)?: None Help needed to walk in hospital room?: None Help needed climbing 3-5 steps with a railing? : A Little 6 Click Score: 21    End of Session Equipment Utilized During Treatment: Gait belt Activity Tolerance: Patient tolerated treatment well Patient left: in chair;with chair alarm set;with call bell/phone within reach;with nursing/sitter in room Nurse Communication: Mobility status PT Visit Diagnosis: Difficulty in walking, not elsewhere classified (R26.2)    Time: 0160-1093 PT Time Calculation (min) (ACUTE ONLY): 16 min   Charges:   PT Evaluation $PT Eval Moderate Complexity: 1 Mod   PT General Charges $$ ACUTE PT VISIT: 1 Visit         Tamala Ser PT 11/12/2023  Acute Rehabilitation Services  Office [phone]

## 2023-11-12 NOTE — Progress Notes (Signed)
 While in room updating son Casimiro Needle on patient's status, patient complained of chest pain and tightness that increased with coughing. Patient had made this same complaint earlier in shift and was given PRN pain medication. Notified attending MD. New order for chest X-ray generated. Patient and son were both made aware of new order and verbalized understanding.

## 2023-11-12 NOTE — Progress Notes (Signed)
 PROGRESS NOTE    Michael Guzman  VWU:[id number] DOB: [birth year] DOA: 11/11/2023 PCP: Patient, No Pcp Per    Brief Narrative:   Michael Guzman is a 78 y.o. male with past medical history significant for HTN, HLD, DM2, history of prostate cancer, GERD, fatty liver disease who presented to [facility] ED on 11/11/2023 from home with complaints of nausea, vomiting, diarrhea over the last 2-3 days.  Denies sick contacts.  Also with decreased appetite, poor oral intake.  Denies any urinary symptoms, no fever, no syncope, no chest pain, no shortness of breath, no abdominal pain.  No recent travel.  No changes in his home medication regimen.  No dietary changes.  In the ED, temperature 98.4 F, HR 79, RR 18, BP 143/82, SpO2 100% on room air.  WBC 6.4, hemoglobin 13.8, platelet count 238.  Sodium 119, potassium 4.7, chloride 85, CO2 23, glucose 117, BUN 8, creatinine 0.90.  AST 27, ALT 23, total bilirubin 0.7.  Lipase 38.  Serum osmolality low at 264.  TSH 0.596.  Cortisol level 7.3.  Urine osmolality 584, urine sodium 114.  COVID/influenza/RSV PCR negative.  Urinalysis unrevealing.  Chest x-ray with no acute cardiopulmonary disease process.  Assessment & Plan:   Hypovolemic hyponatremia Etiology likely secondary to hypovolemic hyponatremia in the setting of poor oral intake, nausea/vomiting and diarrhea over the last several days.  Sodium 119 on admission.  TSH, cortisol level within normal limits.  Serum osmolality low at 264, urine osmolality 584 and urine sodium 114. -- Na 119>123>122 -- NS at 100 mL/h -- Monitor sodium every 6 hours -- Repeat BMP in a.m.  Nausea/vomiting/diarrhea Patient reports 2-3 days of nausea, vomiting and diarrhea.  Denies any sick contacts, no changes in dietary habits. -- C. difficile/GI PCR panel: Ordered -- Tolerating liquid diet, will advance further today -- Continue IV fluid hydration as above -- Supportive care, antiemetics  Generalized weakness,  fatigue, deconditioning PT recommends walker on discharge, no outpatient or home health needed.  Type 2 diabetes mellitus Hemoglobin A1c 7.8 on 08/17/2023.  Home regimen includes Humulin 70/30 30 units twice daily, metformin. -- Lantus 30 units subcutaneously daily -- Sensitive SSI for coverage -- CBG before every meal/at bedtime  Hyperlipidemia -- Holding home atorvastatin for now  Hypertension -- Metoprolol succinate 12.5 mg p.o. daily  BPH --Tamsulosin 0.4 mg p.o. nightly  Anxiety/depression: -- Bupropion 300 mg p.o. daily  GERD -- Protonix 40 mg p.o. daily   DVT prophylaxis: enoxaparin (LOVENOX) injection 40 mg Start: 11/11/23 2200    Code Status: Full Code Family Communication: No family present at bedside this morning  Disposition Plan:  Level of care: Progressive Status is: Inpatient Remains inpatient appropriate because: IV fluid hydration, needs further advancement of diet with toleration, needs improvement of sodium level before stable for discharge home    Consultants:  None  Procedures:  None  Antimicrobials:  None   Subjective: Patient seen and examined at bedside, sitting at edge of bed.  Tolerating liquid diet.  No further diarrhea since admission.  RN present at bedside.  Sodium up to 123 this morning, down to 122; will increase normal saline infusion rate to 100 mL/h.  Will further advance diet today.  Awaiting to obtain stool sample for GI PCR and C. difficile panel.  Patient with no other specific complaints, concerns or questions at this time.  Denies headache, no dizziness, no chest pain, no palpitations, no current nausea/vomiting, no current diarrhea, no abdominal pain, no cough/congestion, no  focal weakness, no paresthesias.  No acute events overnight per nursing staff.  Objective: Vitals:   11/11/23 2131 11/12/23 0158 11/12/23 0350 11/12/23 0504  BP: (!) 150/90 (!) 142/83  116/64  Pulse: 66 65  66  Resp:  19  19  Temp:  98.1 F (36.7 C)   97.8 F (36.6 C)  TempSrc:  Oral  Oral  SpO2:  100%  100%  Weight:   87.8 kg   Height:        Intake/Output Summary (Last 24 hours) at 11/12/2023 1334 Last data filed at 11/12/2023 0510 Gross per 24 hour  Intake 1779.66 ml  Output 725 ml  Net 1054.66 ml   Filed Weights   11/11/23 1725 11/12/23 0350  Weight: 86.1 kg 87.8 kg    Examination:  Physical Exam: GEN: NAD, alert and oriented x 3, elderly in appearance HEENT: NCAT, PERRL, EOMI, sclera clear, dry mucous membranes PULM: CTAB w/o wheezes/crackles, normal respiratory effort, on room air CV: RRR w/o M/G/R GI: abd soft, NTND, NABS, no R/G/M MSK: no peripheral edema, muscle strength globally intact 5/5 bilateral upper/lower extremities NEURO: CN II-XII intact, no focal deficits, sensation to light touch intact PSYCH: normal mood/affect Integumentary: dry/intact, no rashes or wounds    Data Reviewed: I have personally reviewed following labs and imaging studies  CBC: Recent Labs  Lab 11/11/23 0930 11/12/23 0357  WBC 6.4 6.0  HGB 13.8 12.9*  HCT 38.4* 38.4*  MCV 82.9 86.5  PLT 238 210   Basic Metabolic Panel: Recent Labs  Lab 11/11/23 0930 11/11/23 1730 11/11/23 2045 11/12/23 0357 11/12/23 1133  NA 119* 122* 123* 123* 122*  K 4.7 4.3 4.1 3.9  --   CL 85* 89* 92* 90*  --   CO2 23 23 20* 25  --   GLUCOSE 177* 138* 123* 132*  --   BUN 8 8 8 8   --   CREATININE 0.90 0.80 0.79 0.77  --   CALCIUM 10.2 9.3 9.1 9.2  --   MG 1.9  --   --  1.9  --   PHOS 3.1  --   --   --   --    GFR: Estimated Creatinine Clearance: 87.7 mL/min (by C-G formula based on SCr of 0.77 mg/dL). Liver Function Tests: Recent Labs  Lab 11/11/23 0930  AST 27  ALT 23  ALKPHOS 63  BILITOT 0.7  PROT 8.3*  ALBUMIN 5.2*   Recent Labs  Lab 11/11/23 0930  LIPASE 38   No results for input(s): "AMMONIA" in the last 168 hours. Coagulation Profile: No results for input(s): "INR", "PROTIME" in the last 168 hours. Cardiac  Enzymes: No results for input(s): "CKTOTAL", "CKMB", "CKMBINDEX", "TROPONINI" in the last 168 hours. BNP (last 3 results) No results for input(s): "PROBNP" in the last 8760 hours. HbA1C: No results for input(s): "HGBA1C" in the last 72 hours. CBG: Recent Labs  Lab 11/11/23 2058 11/12/23 0349 11/12/23 0740 11/12/23 1216  GLUCAP 121* 109* 132* 141*   Lipid Profile: No results for input(s): "CHOL", "HDL", "LDLCALC", "TRIG", "CHOLHDL", "LDLDIRECT" in the last 72 hours. Thyroid Function Tests: Recent Labs    11/11/23 1907  TSH 0.596   Anemia Panel: No results for input(s): "VITAMINB12", "FOLATE", "FERRITIN", "TIBC", "IRON", "RETICCTPCT" in the last 72 hours. Sepsis Labs: No results for input(s): "PROCALCITON", "LATICACIDVEN" in the last 168 hours.  Recent Results (from the past 240 hours)  Resp panel by RT-PCR (RSV, Flu A&B, Covid) Anterior Nasal Swab  Status: None   Collection Time: 11/11/23  9:30 AM   Specimen: Anterior Nasal Swab  Result Value Ref Range Status   SARS Coronavirus 2 by RT PCR NEGATIVE NEGATIVE Final    Comment: (NOTE) SARS-CoV-2 target nucleic acids are NOT DETECTED.  The SARS-CoV-2 RNA is generally detectable in upper respiratory specimens during the acute phase of infection. The lowest concentration of SARS-CoV-2 viral copies this assay can detect is 138 copies/mL. A negative result does not preclude SARS-Cov-2 infection and should not be used as the sole basis for treatment or other patient management decisions. A negative result may occur with  improper specimen collection/handling, submission of specimen other than nasopharyngeal swab, presence of viral mutation(s) within the areas targeted by this assay, and inadequate number of viral copies(<138 copies/mL). A negative result must be combined with clinical observations, patient history, and epidemiological information. The expected result is Negative.  Fact Sheet for Patients:   BloggerCourse.com  Fact Sheet for Healthcare Providers:  SeriousBroker.it  This test is no t yet approved or cleared by the [place] FDA and  has been authorized for detection and/or diagnosis of SARS-CoV-2 by FDA under an Emergency Use Authorization (EUA). This EUA will remain  in effect (meaning this test can be used) for the duration of the COVID-19 declaration under Section 564(b)(1) of the Act, 21 U.S.C.section 360bbb-3(b)(1), unless the authorization is terminated  or revoked sooner.       Influenza A by PCR NEGATIVE NEGATIVE Final   Influenza B by PCR NEGATIVE NEGATIVE Final    Comment: (NOTE) The Xpert Xpress SARS-CoV-2/FLU/RSV plus assay is intended as an aid in the diagnosis of influenza from Nasopharyngeal swab specimens and should not be used as a sole basis for treatment. Nasal washings and aspirates are unacceptable for Xpert Xpress SARS-CoV-2/FLU/RSV testing.  Fact Sheet for Patients: BloggerCourse.com  Fact Sheet for Healthcare Providers: SeriousBroker.it  This test is not yet approved or cleared by the [place] FDA and has been authorized for detection and/or diagnosis of SARS-CoV-2 by FDA under an Emergency Use Authorization (EUA). This EUA will remain in effect (meaning this test can be used) for the duration of the COVID-19 declaration under Section 564(b)(1) of the Act, 21 U.S.C. section 360bbb-3(b)(1), unless the authorization is terminated or revoked.     Resp Syncytial Virus by PCR NEGATIVE NEGATIVE Final    Comment: (NOTE) Fact Sheet for Patients: BloggerCourse.com  Fact Sheet for Healthcare Providers: SeriousBroker.it  This test is not yet approved or cleared by the [place] FDA and has been authorized for detection and/or diagnosis of SARS-CoV-2 by FDA under an Emergency Use  Authorization (EUA). This EUA will remain in effect (meaning this test can be used) for the duration of the COVID-19 declaration under Section 564(b)(1) of the Act, 21 U.S.C. section 360bbb-3(b)(1), unless the authorization is terminated or revoked.  Performed at [facility], [address] [place], [place] [address]          Radiology Studies: No results found.      Scheduled Meds:  buPROPion  300 mg Oral Daily   enoxaparin (LOVENOX) injection  40 mg Subcutaneous Q24H   insulin aspart  0-9 Units Subcutaneous TID WC   insulin glargine  30 Units Subcutaneous QHS   metoprolol succinate  12.5 mg Oral Daily   montelukast  10 mg Oral QHS   pantoprazole  40 mg Oral BID AC   sodium chloride flush  3 mL Intravenous Q12H   tamsulosin  0.4 mg Oral QHS   Continuous Infusions:  sodium chloride     sodium chloride       LOS: 1 day    Time spent: 50 minutes spent on chart review, discussion with nursing staff, consultants, updating family and interview/physical exam; more than 50% of that time was spent in counseling and/or coordination of care.    Alvira Philips [place], DO [facility] Available via Epic secure chat 7am-7pm After these hours, please refer to coverage provider listed on [email or web address] 11/12/2023, 1:34 PM

## 2023-11-12 NOTE — Progress Notes (Signed)
 Spoke with son Lyda Jester and provided him with an update on patient's status and goals for shift. Asked if he had any questions and he replied "none". Asked if he desired to speak with attending MD and he declined. Spoke to CSW about PT recommendations and advised her that son was bedside. She stated she would speak with patient and son.

## 2023-11-12 NOTE — Progress Notes (Signed)
 Stool sample for C. Diff and GI panel collected and hand delivered to lab for processing. MD made aware.

## 2023-11-13 DIAGNOSIS — E871 Hypo-osmolality and hyponatremia: Secondary | ICD-10-CM | POA: Diagnosis not present

## 2023-11-13 LAB — GASTROINTESTINAL PANEL BY PCR, STOOL (REPLACES STOOL CULTURE)

## 2023-11-13 LAB — GLUCOSE, CAPILLARY: Glucose-Capillary: 103 mg/dL — ABNORMAL HIGH (ref 70–99)

## 2023-11-13 LAB — BASIC METABOLIC PANEL
Anion gap: 6 (ref 5–15)
BUN: 8 mg/dL (ref 8–23)
CO2: 25 mmol/L (ref 22–32)
Calcium: 9.2 mg/dL (ref 8.9–10.3)
Chloride: 98 mmol/L (ref 98–111)
Creatinine, Ser: 0.79 mg/dL (ref 0.61–1.24)
GFR, Estimated: >60 mL/min (ref >60)
Glucose, Bld: 102 mg/dL — ABNORMAL HIGH (ref 70–99)
Potassium: 4 mmol/L (ref 3.5–5.1)
Sodium: 129 mmol/L — ABNORMAL LOW (ref 135–145)

## 2023-11-13 MED ORDER — SODIUM CHLORIDE 0.9 % IV SOLN: INTRAVENOUS | Status: DC

## 2023-11-13 NOTE — Discharge Summary (Signed)
 Physician Discharge Summary  Michael Guzman ZOX:[id number] DOB: [birth year] DOA: 11/11/2023  PCP: Patient, No Pcp Per  Admit date: 11/11/2023 Discharge date: 11/13/2023  Admitted From: Home Disposition: Home  Recommendations for Outpatient Follow-up:  Follow up with PCP in 1-2 weeks Holding home metoprolol for borderline bradycardia during hospitalization Please obtain BMP in one week; sodium level 129 at time of discharge Please follow up on the following pending results: GI PCR panel that was pending at time of discharge  Home Health: No needs identified by PT Equipment/Devices: Walker  Discharge Condition: Stable CODE STATUS: Full code Diet recommendation: Heart healthy/consistent carbohydrate diet  History of present illness:  Michael Guzman is a 78 y.o. male with past medical history significant for HTN, HLD, DM2, history of prostate cancer, GERD, fatty liver disease who presented to [facility] ED on 11/11/2023 from home with complaints of nausea, vomiting, diarrhea over the last 2-3 days.  Denies sick contacts.  Also with decreased appetite, poor oral intake.  Denies any urinary symptoms, no fever, no syncope, no chest pain, no shortness of breath, no abdominal pain.  No recent travel.  No changes in his home medication regimen.  No dietary changes.   In the ED, temperature 98.4 F, HR 79, RR 18, BP 143/82, SpO2 100% on room air.  WBC 6.4, hemoglobin 13.8, platelet count 238.  Sodium 119, potassium 4.7, chloride 85, CO2 23, glucose 117, BUN 8, creatinine 0.90.  AST 27, ALT 23, total bilirubin 0.7.  Lipase 38.  Serum osmolality low at 264.  TSH 0.596.  Cortisol level 7.3.  Urine osmolality 584, urine sodium 114.  COVID/influenza/RSV PCR negative.  Urinalysis unrevealing.  Chest x-ray with no acute cardiopulmonary disease process.  Hospital course:  Hypovolemic hyponatremia Etiology likely secondary to hypovolemic hyponatremia in the setting of poor oral intake,  nausea/vomiting and diarrhea over the last several days.  Sodium 119 on admission.  TSH, cortisol level within normal limits.  Serum osmolality low at 264, urine osmolality 584 and urine sodium 114.  Patient was supported with aggressive IV fluid resuscitation with improvement of sodium to 129 at time of discharge.  Patient with no further nausea/vomiting or significant diarrhea.  Patient desires discharge home.  Recommend maintain hydration and follow-up with PCP in 1-2 weeks with repeat BMP.   Gastroenteritis Patient reports 2-3 days of nausea, vomiting and diarrhea.  Denies any sick contacts, no changes in dietary habits.  C. difficile PCR negative.  Patient was started on a clear liquid diet and slowly advance with toleration.  No further episodes of vomiting or significant diarrhea.  Discussed with patient to maintain adequate hydration on discharge and repeat BMP 1 week.   Generalized weakness, fatigue, deconditioning PT recommends walker on discharge, no outpatient or home health needed.   Type 2 diabetes mellitus, with hyperglycemia Hemoglobin A1c 7.8 on 08/17/2023.  Home regimen includes Humulin 70/30 30 units twice daily, metformin.   Hyperlipidemia Continue atorvastatin   Hypertension Hold home metoprolol succinate 12.5 mg p.o. daily until follow-up with PCP for borderline bradycardia during hospitalization.   BPH Tamsulosin 0.4 mg p.o. nightly   Anxiety/depression: Bupropion 300 mg p.o. daily   GERD Protonix 40 mg p.o. daily  Discharge Diagnoses:  Principal Problem:   Hyponatremia Active Problems:   Hypertension (1991)   GERD   Hyperlipidemia associated with type 2 diabetes mellitus (HCC)   Hypovolemia   Nausea vomiting and diarrhea    Discharge Instructions  Discharge Instructions     Call  MD for:  difficulty breathing, headache or visual disturbances   Complete by: As directed    Call MD for:  extreme fatigue   Complete by: As directed    Call MD for:   persistant dizziness or light-headedness   Complete by: As directed    Call MD for:  persistant nausea and vomiting   Complete by: As directed    Call MD for:  severe uncontrolled pain   Complete by: As directed    Call MD for:  temperature >100.4   Complete by: As directed    Diet - low sodium heart healthy   Complete by: As directed    Increase activity slowly   Complete by: As directed       Allergies as of 11/13/2023   No Known Allergies      Medication List     PAUSE taking these medications    metoprolol succinate 25 MG 24 hr tablet Wait to take this until your doctor or other care provider tells you to start again. Commonly known as: TOPROL-XL TAKE ONE-HALF TABLET BY MOUTH  DAILY       STOP taking these medications    furosemide 40 MG tablet Commonly known as: Lasix   ondansetron 8 MG tablet Commonly known as: Zofran       TAKE these medications    aspirin EC 81 MG tablet Take 1 tablet (81 mg total) by mouth daily.   atorvastatin 10 MG tablet Commonly known as: LIPITOR TAKE 1 TABLET BY MOUTH DAILY   buPROPion 300 MG 24 hr tablet Commonly known as: WELLBUTRIN XL TAKE 1 TABLET BY MOUTH DAILY FOR MOOD, FOCUS AND CONCENTRATION   gabapentin 800 MG tablet Commonly known as: NEURONTIN TAKE 1 TABLET BY MOUTH 3 TO 4  TIMES DAILY FOR PAIN, HOT  FLASHES AND SWEATS   glycopyrrolate 2 MG tablet Commonly known as: ROBINUL Take  1 tablet  3 x / day for Excessive Sweating  TAKE ONE TABLET BY                                                 MOUTH   HumuLIN 70/30 (70-30) 100 UNIT/ML injection Generic drug: insulin NPH-regular Human INJECT SUBCUTANEOUSLY 50 UNITS  TWICE DAILY What changed: See the new instructions.   Insulin Syringes (Disposable) U-100 1 ML Misc 50 Units by Does not apply route 2 (two) times daily. inject 50 units into skin 2 x /day   Magnesium 500 MG Tabs Take 500 mg by mouth daily.   meloxicam 15 MG tablet Commonly known as: MOBIC Take  15 mg by mouth daily.   metFORMIN 500 MG 24 hr tablet Commonly known as: GLUCOPHAGE-XR TAKE 2 TABLETS BY MOUTH TWICE  DAILY WITH MEALS FOR DIABETES   montelukast 10 MG tablet Commonly known as: SINGULAIR TAKE 1 TABLET BY MOUTH DAILY FOR ALLERGIES   multivitamin with minerals Tabs tablet Take 1 tablet by mouth daily. men   nitroGLYCERIN 0.4 MG SL tablet Commonly known as: NITROSTAT PLACE 1 TABLET UNDER THE TONGUE EVERY 5 MINUTES AS NEEDED FOR CHEST PAIN   ONE TOUCH ULTRA 2 w/Device Kit Use as directed   OneTouch Delica Plus Lancet33G Misc CHECK BLOOD SUGAR 3 TIMES  DAILY   OneTouch Ultra test strip Generic drug: glucose blood CHECK BLOOD SUGAR 3 TIMES  DAILY   pantoprazole  40 MG tablet Commonly known as: PROTONIX TAKE 1 TABLET BY MOUTH DAILY TO  PREVENT HEARTBURN AND  INDIGESTION   SUPER B COMPLEX PO Take 1 tablet by mouth daily.   tamsulosin 0.4 MG Caps capsule Commonly known as: FLOMAX Take 0.4 mg by mouth at bedtime.   vitamin C 1000 MG tablet Take 1,000 mg by mouth daily.   Vitamin D3 25 MCG (1000 UT) Caps Take 10,000 Units by mouth daily. 250 mcg   Vitamin E 450 MG (1000 UT) Caps Take 450 mg by mouth daily.   ZINC PO Take 50 mg by mouth daily.               Durable Medical Equipment  (From admission, onward)           Start     Ordered   11/12/23 1333  For home use only DME Walker rolling  Once       Question Answer Comment  Walker: With 5 Inch Wheels   Patient needs a walker to treat with the following condition Gait abnormality      11/12/23 1332            No Known Allergies  Consultations: None   Procedures/Studies: DG CHEST PORT 1 VIEW Result Date: 11/12/2023 CLINICAL DATA:  Cough with nausea and vomiting, initial encounter EXAM: PORTABLE CHEST 1 VIEW COMPARISON:  01/11/2021 FINDINGS: Cardiac shadow is within normal limits. Aortic calcifications are noted. The lungs are well aerated bilaterally. No focal infiltrate or  effusion is noted. No bony abnormality is seen. IMPRESSION: No acute abnormality noted. Electronically Signed   By: Alcide Clever M.D.   On: 11/12/2023 21:30     Subjective: Patient seen examined bedside, sitting in bedside chair.  Eating breakfast.  No complaints this morning.  Sodium up to 129.  Discussed with patient if he would like to continue to maintain hospitalization with IV fluids; patient desires discharge home as he is tolerating oral intake without issue.  Discussed with patient needs to follow-up with his PCP in 1-2 weeks for repeat BMP.  Updated patient's daughter, Elmarie Shiley via telephone this morning.  No other specific complaints, concerns or questions at this time.  Denies headache, no dizziness, no chest pain, no palpitations, no shortness of breath, no abdominal pain, no fever/chills/night sweats, no current nausea/vomiting, no focal weakness, no fatigue, no paresthesia.  No acute events overnight per nursing staff.  Discharge Exam: Vitals:   11/13/23 0429 11/13/23 0831  BP: 123/78 120/67  Pulse: (!) 57 (!) 55  Resp: 16 18  Temp: 98.3 F (36.8 C) 98.1 F (36.7 C)  SpO2: 100% 97%   Vitals:   11/12/23 2008 11/13/23 0429 11/13/23 0431 11/13/23 0831  BP: 119/76 123/78  120/67  Pulse: (!) 57 (!) 57  (!) 55  Resp: 18 16  18   Temp: 98.6 F (37 C) 98.3 F (36.8 C)  98.1 F (36.7 C)  TempSrc: Oral Oral  Oral  SpO2: 100% 100%  97%  Weight:   86.2 kg   Height:        Physical Exam: GEN: NAD, alert and oriented x 3, elderly in appearance HEENT: NCAT, PERRL, EOMI, sclera clear, MMM PULM: CTAB w/o wheezes/crackles, normal respiratory effort, on room air CV: Bradycardic, regular rhythm w/o M/G/R GI: abd soft, NTND, NABS, no R/G/M MSK: no peripheral edema, muscle strength globally intact 5/5 bilateral upper/lower extremities NEURO: CN II-XII intact, no focal deficits, sensation to light touch intact PSYCH: normal mood/affect Integumentary:  dry/intact, no rashes or  wounds    The results of significant diagnostics from this hospitalization (including imaging, microbiology, ancillary and laboratory) are listed below for reference.     Microbiology: Recent Results (from the past 240 hours)  Resp panel by RT-PCR (RSV, Flu A&B, Covid) Anterior Nasal Swab     Status: None   Collection Time: 11/11/23  9:30 AM   Specimen: Anterior Nasal Swab  Result Value Ref Range Status   SARS Coronavirus 2 by RT PCR NEGATIVE NEGATIVE Final    Comment: (NOTE) SARS-CoV-2 target nucleic acids are NOT DETECTED.  The SARS-CoV-2 RNA is generally detectable in upper respiratory specimens during the acute phase of infection. The lowest concentration of SARS-CoV-2 viral copies this assay can detect is 138 copies/mL. A negative result does not preclude SARS-Cov-2 infection and should not be used as the sole basis for treatment or other patient management decisions. A negative result may occur with  improper specimen collection/handling, submission of specimen other than nasopharyngeal swab, presence of viral mutation(s) within the areas targeted by this assay, and inadequate number of viral copies(<138 copies/mL). A negative result must be combined with clinical observations, patient history, and epidemiological information. The expected result is Negative.  Fact Sheet for Patients:  BloggerCourse.com  Fact Sheet for Healthcare Providers:  SeriousBroker.it  This test is no t yet approved or cleared by the [place] FDA and  has been authorized for detection and/or diagnosis of SARS-CoV-2 by FDA under an Emergency Use Authorization (EUA). This EUA will remain  in effect (meaning this test can be used) for the duration of the COVID-19 declaration under Section 564(b)(1) of the Act, 21 U.S.C.section 360bbb-3(b)(1), unless the authorization is terminated  or revoked sooner.       Influenza A by PCR NEGATIVE  NEGATIVE Final   Influenza B by PCR NEGATIVE NEGATIVE Final    Comment: (NOTE) The Xpert Xpress SARS-CoV-2/FLU/RSV plus assay is intended as an aid in the diagnosis of influenza from Nasopharyngeal swab specimens and should not be used as a sole basis for treatment. Nasal washings and aspirates are unacceptable for Xpert Xpress SARS-CoV-2/FLU/RSV testing.  Fact Sheet for Patients: BloggerCourse.com  Fact Sheet for Healthcare Providers: SeriousBroker.it  This test is not yet approved or cleared by the [place] FDA and has been authorized for detection and/or diagnosis of SARS-CoV-2 by FDA under an Emergency Use Authorization (EUA). This EUA will remain in effect (meaning this test can be used) for the duration of the COVID-19 declaration under Section 564(b)(1) of the Act, 21 U.S.C. section 360bbb-3(b)(1), unless the authorization is terminated or revoked.     Resp Syncytial Virus by PCR NEGATIVE NEGATIVE Final    Comment: (NOTE) Fact Sheet for Patients: BloggerCourse.com  Fact Sheet for Healthcare Providers: SeriousBroker.it  This test is not yet approved or cleared by the [place] FDA and has been authorized for detection and/or diagnosis of SARS-CoV-2 by FDA under an Emergency Use Authorization (EUA). This EUA will remain in effect (meaning this test can be used) for the duration of the COVID-19 declaration under Section 564(b)(1) of the Act, 21 U.S.C. section 360bbb-3(b)(1), unless the authorization is terminated or revoked.  Performed at [facility], [address], [place], [place] [address]   C Difficile Quick Screen w PCR reflex     Status: None   Collection Time: 11/12/23  2:25 PM   Specimen: STOOL  Result Value Ref Range Status   C Diff antigen NEGATIVE NEGATIVE Final  C Diff toxin NEGATIVE NEGATIVE Final   C Diff interpretation No  C. difficile detected.  Final    Comment: Performed at [facility], [address] [place], [place] [address]     Labs: BNP (last 3 results) No results for input(s): "BNP" in the last 8760 hours. Basic Metabolic Panel: Recent Labs  Lab 11/11/23 0930 11/11/23 1730 11/11/23 2045 11/12/23 0357 11/12/23 1133 11/12/23 1613 11/12/23 2131 11/13/23 0418  NA 119* 122* 123* 123* 122* 123* 125* 129*  K 4.7 4.3 4.1 3.9  --   --   --  4.0  CL 85* 89* 92* 90*  --   --   --  98  CO2 23 23 20* 25  --   --   --  25  GLUCOSE 177* 138* 123* 132*  --   --   --  102*  BUN 8 8 8 8   --   --   --  8  CREATININE 0.90 0.80 0.79 0.77  --   --   --  0.79  CALCIUM 10.2 9.3 9.1 9.2  --   --   --  9.2  MG 1.9  --   --  1.9  --   --   --   --   PHOS 3.1  --   --   --   --   --   --   --    Liver Function Tests: Recent Labs  Lab 11/11/23 0930  AST 27  ALT 23  ALKPHOS 63  BILITOT 0.7  PROT 8.3*  ALBUMIN 5.2*   Recent Labs  Lab 11/11/23 0930  LIPASE 38   No results for input(s): "AMMONIA" in the last 168 hours. CBC: Recent Labs  Lab 11/11/23 0930 11/12/23 0357  WBC 6.4 6.0  HGB 13.8 12.9*  HCT 38.4* 38.4*  MCV 82.9 86.5  PLT 238 210   Cardiac Enzymes: No results for input(s): "CKTOTAL", "CKMB", "CKMBINDEX", "TROPONINI" in the last 168 hours. BNP: Invalid input(s): "POCBNP" CBG: Recent Labs  Lab 11/12/23 0740 11/12/23 1216 11/12/23 1714 11/12/23 2035 11/13/23 0754  GLUCAP 132* 141* 89 151* 103*   D-Dimer No results for input(s): "DDIMER" in the last 72 hours. Hgb A1c No results for input(s): "HGBA1C" in the last 72 hours. Lipid Profile No results for input(s): "CHOL", "HDL", "LDLCALC", "TRIG", "CHOLHDL", "LDLDIRECT" in the last 72 hours. Thyroid function studies Recent Labs    11/11/23 1907  TSH 0.596   Anemia work up No results for input(s): "VITAMINB12", "FOLATE", "FERRITIN", "TIBC", "IRON", "RETICCTPCT" in the last 72 hours. Urinalysis     Component Value Date/Time   COLORURINE YELLOW 11/11/2023 1418   APPEARANCEUR CLEAR 11/11/2023 1418   LABSPEC 1.019 11/11/2023 1418   LABSPEC 1.020 10/14/2016 1230   PHURINE 7.0 11/11/2023 1418   GLUCOSEU 100 (A) 11/11/2023 1418   GLUCOSEU 2,000 10/14/2016 1230   HGBUR NEGATIVE 11/11/2023 1418   BILIRUBINUR NEGATIVE 11/11/2023 1418   BILIRUBINUR Negative 10/14/2016 1230   KETONESUR 15 (A) 11/11/2023 1418   PROTEINUR 30 (A) 11/11/2023 1418   UROBILINOGEN 0.2 10/14/2016 1230   NITRITE NEGATIVE 11/11/2023 1418   LEUKOCYTESUR NEGATIVE 11/11/2023 1418   LEUKOCYTESUR Negative 10/14/2016 1230   Sepsis Labs Recent Labs  Lab 11/11/23 0930 11/12/23 0357  WBC 6.4 6.0   Microbiology Recent Results (from the past 240 hours)  Resp panel by RT-PCR (RSV, Flu A&B, Covid) Anterior Nasal Swab     Status: None   Collection Time: 11/11/23  9:30 AM   Specimen: Anterior Nasal Swab  Result Value Ref Range Status   SARS Coronavirus 2 by RT PCR NEGATIVE NEGATIVE Final    Comment: (NOTE) SARS-CoV-2 target nucleic acids are NOT DETECTED.  The SARS-CoV-2 RNA is generally detectable in upper respiratory specimens during the acute phase of infection. The lowest concentration of SARS-CoV-2 viral copies this assay can detect is 138 copies/mL. A negative result does not preclude SARS-Cov-2 infection and should not be used as the sole basis for treatment or other patient management decisions. A negative result may occur with  improper specimen collection/handling, submission of specimen other than nasopharyngeal swab, presence of viral mutation(s) within the areas targeted by this assay, and inadequate number of viral copies(<138 copies/mL). A negative result must be combined with clinical observations, patient history, and epidemiological information. The expected result is Negative.  Fact Sheet for Patients:  BloggerCourse.com  Fact Sheet for Healthcare Providers:   SeriousBroker.it  This test is no t yet approved or cleared by the [place] FDA and  has been authorized for detection and/or diagnosis of SARS-CoV-2 by FDA under an Emergency Use Authorization (EUA). This EUA will remain  in effect (meaning this test can be used) for the duration of the COVID-19 declaration under Section 564(b)(1) of the Act, 21 U.S.C.section 360bbb-3(b)(1), unless the authorization is terminated  or revoked sooner.       Influenza A by PCR NEGATIVE NEGATIVE Final   Influenza B by PCR NEGATIVE NEGATIVE Final    Comment: (NOTE) The Xpert Xpress SARS-CoV-2/FLU/RSV plus assay is intended as an aid in the diagnosis of influenza from Nasopharyngeal swab specimens and should not be used as a sole basis for treatment. Nasal washings and aspirates are unacceptable for Xpert Xpress SARS-CoV-2/FLU/RSV testing.  Fact Sheet for Patients: BloggerCourse.com  Fact Sheet for Healthcare Providers: SeriousBroker.it  This test is not yet approved or cleared by the [place] FDA and has been authorized for detection and/or diagnosis of SARS-CoV-2 by FDA under an Emergency Use Authorization (EUA). This EUA will remain in effect (meaning this test can be used) for the duration of the COVID-19 declaration under Section 564(b)(1) of the Act, 21 U.S.C. section 360bbb-3(b)(1), unless the authorization is terminated or revoked.     Resp Syncytial Virus by PCR NEGATIVE NEGATIVE Final    Comment: (NOTE) Fact Sheet for Patients: BloggerCourse.com  Fact Sheet for Healthcare Providers: SeriousBroker.it  This test is not yet approved or cleared by the [place] FDA and has been authorized for detection and/or diagnosis of SARS-CoV-2 by FDA under an Emergency Use Authorization (EUA). This EUA will remain in effect (meaning this test can be used) for  the duration of the COVID-19 declaration under Section 564(b)(1) of the Act, 21 U.S.C. section 360bbb-3(b)(1), unless the authorization is terminated or revoked.  Performed at [facility], [address], [place], [place] [address]   C Difficile Quick Screen w PCR reflex     Status: None   Collection Time: 11/12/23  2:25 PM   Specimen: STOOL  Result Value Ref Range Status   C Diff antigen NEGATIVE NEGATIVE Final   C Diff toxin NEGATIVE NEGATIVE Final   C Diff interpretation No C. difficile detected.  Final    Comment: Performed at [facility], [address], [place], [place] [address]     Time coordinating discharge: Over 30 minutes  SIGNED:   Alvira Philips [place], DO  [facility] 11/13/2023, 11:01 AM

## 2023-11-13 NOTE — Progress Notes (Signed)
 Patient being discharged from [facility] Unit 4W Room 1445 per order. PIV removed by NT. AVS reviewed via phone with daughter Elmarie Shiley. Questions/concerns addressed. Hard copy provided to patient. All personal belongings forwarded with patient.

## 2023-11-13 NOTE — Plan of Care (Signed)
  Problem: Clinical Measurements: Goal: Ability to maintain clinical measurements within normal limits will improve 11/13/2023 0925 by Payton Spark, RN Outcome: Progressing 11/13/2023 0925 by Payton Spark, RN Outcome: Progressing   Problem: Clinical Measurements: Goal: Will remain free from infection 11/13/2023 0925 by Payton Spark, RN Outcome: Progressing 11/13/2023 0925 by Payton Spark, RN Outcome: Progressing   Problem: Clinical Measurements: Goal: Respiratory complications will improve 11/13/2023 0925 by Payton Spark, RN Outcome: Progressing 11/13/2023 0925 by Payton Spark, RN Outcome: Progressing

## 2023-11-13 NOTE — TOC Transition Note (Signed)
 Transition of Care [facility]) - Discharge Note   Patient Details  Name: Michael Guzman MRN: [id number] Date of Birth: 16-May-1946  Transition [facility]) CM/SW Contact:  Adrian Prows, RN Phone Number: 11/13/2023, 1:20 PM   Clinical Narrative:    D/C orders received; no TOC needs.   Final next level of care: Home/Self Care Barriers to Discharge: No Barriers Identified   Patient Goals and CMS Choice Patient states their goals for this hospitalization and ongoing recovery are:: return home CMS Medicare.gov Compare Post Acute Care list provided to:: Patient Choice offered to / list presented to : Patient      Discharge Placement                       Discharge Plan and Services Additional resources added to the After Visit Summary for                  DME Arranged: Dan Humphreys DME Agency: [employer]                  Social Drivers of Health (SDOH) Interventions SDOH Screenings   Food Insecurity: No Food Insecurity (11/11/2023)  Housing: Low Risk  (11/11/2023)  Transportation Needs: No Transportation Needs (11/11/2023)  Utilities: Not At Risk (11/11/2023)  Depression (PHQ2-9): Low Risk  (05/09/2023)  Social Connections: Patient Declined (11/11/2023)  Tobacco Use: Medium Risk (11/11/2023)     Readmission Risk Interventions     No data to display

## 2023-11-13 NOTE — Progress Notes (Signed)
 Mobility Specialist - Progress Note   11/13/23 1212  Mobility  Activity Ambulated with assistance in hallway;Ambulated independently in hallway  Level of Assistance Modified independent, requires aide device or extra time  Assistive Device Front wheel walker;None  Distance Ambulated (ft) 350 ft  Activity Response Tolerated well  Mobility Referral Yes  Mobility visit 1 Mobility  Mobility Specialist Start Time (ACUTE ONLY) 1157  Mobility Specialist Stop Time (ACUTE ONLY) 1210  Mobility Specialist Time Calculation (min) (ACUTE ONLY) 13 min   Pt received in recliner and agreeable to mobility. Pt ambulated ~171ft with AD & 152ft with out AD. No complaints during session. Pt to recliner after session with all needs met.    [facility]

## 2023-11-15 ENCOUNTER — Telehealth: Payer: Self-pay

## 2023-11-15 NOTE — Transitions of Care (Post Inpatient/ED Visit) (Signed)
   11/15/2023  Name: Michael Guzman MRN: [id number] DOB: 15-Nov-1945  Today's TOC FU Call Status: Today's TOC FU Call Status:: Unsuccessful Call (1st Attempt) Unsuccessful Call (1st Attempt) Date: 11/15/23  Attempted to reach the patient regarding the most recent Inpatient/ED visit.  Follow Up Plan: Additional outreach attempts will be made to reach the patient to complete the Transitions of Care (Post Inpatient/ED visit) call.   Petro Talent A. Mliss Fritz RN, BA, [facility], CRRN [facility] RN Care Manager, Transition of Care [phone]

## 2023-11-16 ENCOUNTER — Telehealth: Payer: Self-pay

## 2023-11-16 NOTE — Transitions of Care (Post Inpatient/ED Visit) (Signed)
   11/16/2023  Name: Michael Guzman MRN: [id number] DOB: 1946/06/26  Today's TOC FU Call Status: Today's TOC FU Call Status:: Unsuccessful Call (2nd Attempt) Unsuccessful Call (2nd Attempt) Date: 11/16/23  Attempted to reach the patient regarding the most recent Inpatient/ED visit.  Follow Up Plan: Additional outreach attempts will be made to reach the patient to complete the Transitions of Care (Post Inpatient/ED visit) call.   Rielle Schlauch A. Mliss Fritz RN, BA, [facility], CRRN [facility] RN Care Manager, Transition of Care [phone]

## 2023-11-17 ENCOUNTER — Telehealth: Payer: Self-pay

## 2023-11-17 NOTE — Transitions of Care (Post Inpatient/ED Visit) (Signed)
   11/17/2023  Name: CARDER YIN MRN: [id number] DOB: 1946/03/07  Today's TOC FU Call Status: Today's TOC FU Call Status:: Unsuccessful Call (3rd Attempt) Unsuccessful Call (3rd Attempt) Date: 11/17/23  Attempted to reach the patient regarding the most recent Inpatient/ED visit.  Follow Up Plan: No further outreach attempts will be made at this time. We have been unable to contact the patient.  Alyse Low, RN, BA, [facility], CRRN [facility] Coordinator, Transition of Care Ph # [phone]

## 2023-11-18 ENCOUNTER — Telehealth: Payer: Self-pay

## 2023-11-18 NOTE — Transitions of Care (Post Inpatient/ED Visit) (Signed)
   11/18/2023  Name: Michael Guzman MRN: [id number] DOB: May 25, 1946  Today's TOC FU Call Status:    Attempted to return patient's phone call, was unable to reach the patient regarding the most recent Inpatient/ED visit.  Follow Up Plan: No further outreach attempts will be made at this time. We have been unable to contact the patient. Sent secure chat to team responsible for setting up post-hospitalization PCP follow ups for former patients of Dr. Oneta Rack for further contact attempts to schedule patient for 7-14d post-hospitalization FU.  Kailea Dannemiller A. Mliss Fritz RN, BA, [facility], CRRN [facility] RN Care Manager, Transition of Care [phone]

## 2023-11-22 ENCOUNTER — Ambulatory Visit (INDEPENDENT_AMBULATORY_CARE_PROVIDER_SITE_OTHER): Admitting: Family Medicine

## 2023-11-22 ENCOUNTER — Encounter: Payer: Self-pay | Admitting: Family Medicine

## 2023-11-22 VITALS — BP 118/72 | HR 67 | Temp 98.2°F | Ht 70.0 in | Wt 202.4 lb

## 2023-11-22 DIAGNOSIS — F339 Major depressive disorder, recurrent, unspecified: Secondary | ICD-10-CM

## 2023-11-22 DIAGNOSIS — Z9861 Coronary angioplasty status: Secondary | ICD-10-CM

## 2023-11-22 DIAGNOSIS — E871 Hypo-osmolality and hyponatremia: Secondary | ICD-10-CM

## 2023-11-22 DIAGNOSIS — Z87891 Personal history of nicotine dependence: Secondary | ICD-10-CM | POA: Diagnosis not present

## 2023-11-22 DIAGNOSIS — E785 Hyperlipidemia, unspecified: Secondary | ICD-10-CM

## 2023-11-22 DIAGNOSIS — I2510 Atherosclerotic heart disease of native coronary artery without angina pectoris: Secondary | ICD-10-CM

## 2023-11-22 DIAGNOSIS — E1159 Type 2 diabetes mellitus with other circulatory complications: Secondary | ICD-10-CM

## 2023-11-22 DIAGNOSIS — I10 Essential (primary) hypertension: Secondary | ICD-10-CM | POA: Diagnosis not present

## 2023-11-22 DIAGNOSIS — K219 Gastro-esophageal reflux disease without esophagitis: Secondary | ICD-10-CM

## 2023-11-22 DIAGNOSIS — E1169 Type 2 diabetes mellitus with other specified complication: Secondary | ICD-10-CM

## 2023-11-22 DIAGNOSIS — C61 Malignant neoplasm of prostate: Secondary | ICD-10-CM

## 2023-11-22 NOTE — Assessment & Plan Note (Signed)
 Chronic well controlled. May remain off of Metoprolol. Follow up with cardiology. Recommend heart healthy diet such as Mediterranean diet with whole grains, fruits, vegetable, fish, lean meats, nuts, and olive oil. Limit salt. Encouraged moderate walking, 3-5 times/week for 30-50 minutes each session. Aim for at least 150 minutes.week. Goal should be pace of 3 miles/hours, or walking 1.5 miles in 30 minutes. Avoid tobacco products. Avoid excess alcohol. Take medications as prescribed and bring medications and blood pressure log with cuff to each office visit. Seek medical care for chest pain, palpitations, shortness of breath with exertion, dizziness/lightheadedness, vision changes, recurrent headaches, or swelling of extremities.

## 2023-11-22 NOTE — Assessment & Plan Note (Signed)
 Upcoming stress test with Cardiology.

## 2023-11-22 NOTE — Assessment & Plan Note (Addendum)
 Well controlled on Wellbutrin

## 2023-11-22 NOTE — Assessment & Plan Note (Signed)
Well controlled on Omeprazole 20 mg daily.

## 2023-11-22 NOTE — Progress Notes (Signed)
 New Patient Office Visit  Subjective    Patient ID: Michael Guzman, male    DOB: 25-Feb-1946  Age: 78 y.o. MRN: [id number]  CC:  Chief Complaint  Patient presents with   Transitions Of Care    [place], 2/27-11/13/23, dx Hyponatremia     HPI Michael Guzman presents to establish care and follow up for recent hospitalization due to hyponatremia. See HPI below for hospitalization course. PMH includes HTN, HLD, DM2, CKD2, prostate CA, GERD, anemia, former smoker, ASCAD s/p PTCA 2013, glaucoma, OSA, and fatty liver disease for which he has been seeing a PCP as recent as 4 months ago. He is also followed by Cardiology, Urology, Ophthalmology, Neurology. Since his hospitalization he reports he has had no further nausea or vomiting, diarrhea, AMS.   Hospital FOLLOW UP Time since discharge: 11 days Hospital/facility: [facility] ED Diagnosis: hypovolemic hyponatremia Procedures/tests: CMP, TSH, cortisol, urine osmo, respiratory viral panel, cxr, gi pathogen panel (-) Consultants: n/a New medications: none Discharge instructions: hold Metoprolol, Lasix, Zofran Status: better   DM2: A1c 7.8% Well controlled on Humulin 70/30 30 units daily if BG >110 and Metformin 1000mg  BID. Eye exam upcoming.  CKD stage 2: avoiding NSAIDs  HTN: Well controlled on Metoprolol 12.5mg  daily prior to hospitalization, this is being held since due to bradycardia.   HLD: Atorvastatin 10mg  daily  ASCAD: last stress 10/2018 normal, followed by Dr Anne Fu, upcoming stress in April  Prostate CA: followed by Dr Mena Goes  Colon CA screening: colonoscopy 1 years ago with abnormalities, 28mm lesion benign, f/u 9-36mo, pt refuses Prostate CA screening: Prostate cancer screening and PSA options (with potential risks and benefits of testing vs. not testing) were discussed along with recent recs/guidelines.  With Urology Tobacco: former smoker, quit 13 years ago, 37.5 pack year history Vaccines:  UTD  Hospital  HPI 11/13/2023 for reference only:    Hypovolemic hyponatremia Etiology likely secondary to hypovolemic hyponatremia in the setting of poor oral intake, nausea/vomiting and diarrhea over the last several days.  Sodium 119 on admission.  TSH, cortisol level within normal limits.  Serum osmolality low at 264, urine osmolality 584 and urine sodium 114.  Patient was supported with aggressive IV fluid resuscitation with improvement of sodium to 129 at time of discharge.  Patient with no further nausea/vomiting or significant diarrhea.  Patient desires discharge home.  Recommend maintain hydration and follow-up with PCP in 1-2 weeks with repeat BMP.   Gastroenteritis Patient reports 2-3 days of nausea, vomiting and diarrhea.  Denies any sick contacts, no changes in dietary habits.  C. difficile PCR negative.  Patient was started on a clear liquid diet and slowly advance with toleration.  No further episodes of vomiting or significant diarrhea.  Discussed with patient to maintain adequate hydration on discharge and repeat BMP 1 week.   Generalized weakness, fatigue, deconditioning PT recommends walker on discharge, no outpatient or home health needed.   Type 2 diabetes mellitus, with hyperglycemia Hemoglobin A1c 7.8 on 08/17/2023.  Home regimen includes Humulin 70/30 30 units twice daily, metformin.   Hyperlipidemia Continue atorvastatin   Hypertension Hold home metoprolol succinate 12.5 mg p.o. daily until follow-up with PCP for borderline bradycardia during hospitalization.   BPH Tamsulosin 0.4 mg p.o. nightly   Anxiety/depression: Bupropion 300 mg p.o. daily   GERD Protonix 40 mg p.o. daily  Outpatient Encounter Medications as of 11/22/2023  Medication Sig   Ascorbic Acid (VITAMIN C) 1000 MG tablet Take 1,000 mg by mouth daily.  aspirin EC 81 MG tablet Take 1 tablet (81 mg total) by mouth daily.   atorvastatin (LIPITOR) 10 MG tablet TAKE 1 TABLET BY MOUTH DAILY   B Complex-C (SUPER B  COMPLEX PO) Take 1 tablet by mouth daily.   Blood Glucose Monitoring Suppl (ONE TOUCH ULTRA 2) w/Device KIT Use as directed   buPROPion (WELLBUTRIN XL) 300 MG 24 hr tablet TAKE 1 TABLET BY MOUTH DAILY FOR MOOD, FOCUS AND CONCENTRATION   Cholecalciferol (VITAMIN D3) 25 MCG (1000 UT) CAPS Take 10,000 Units by mouth daily. 250 mcg   gabapentin (NEURONTIN) 800 MG tablet TAKE 1 TABLET BY MOUTH 3 TO 4  TIMES DAILY FOR PAIN, HOT  FLASHES AND SWEATS   glucose blood (ONETOUCH ULTRA) test strip CHECK BLOOD SUGAR 3 TIMES  DAILY   glycopyrrolate (ROBINUL) 2 MG tablet Take  1 tablet  3 x / day for Excessive Sweating  TAKE ONE TABLET BY                                                 MOUTH   Insulin Syringes, Disposable, U-100 1 ML MISC 50 Units by Does not apply route 2 (two) times daily. inject 50 units into skin 2 x /day   Lancets (ONETOUCH DELICA PLUS LANCET33G) MISC CHECK BLOOD SUGAR 3 TIMES  DAILY   Magnesium 500 MG TABS Take 500 mg by mouth daily.   meloxicam (MOBIC) 15 MG tablet Take 15 mg by mouth daily.   metFORMIN (GLUCOPHAGE-XR) 500 MG 24 hr tablet TAKE 2 TABLETS BY MOUTH TWICE  DAILY WITH MEALS FOR DIABETES   [Paused] metoprolol succinate (TOPROL-XL) 25 MG 24 hr tablet TAKE ONE-HALF TABLET BY MOUTH  DAILY   montelukast (SINGULAIR) 10 MG tablet TAKE 1 TABLET BY MOUTH DAILY FOR ALLERGIES   Multiple Vitamin (MULTIVITAMIN WITH MINERALS) TABS tablet Take 1 tablet by mouth daily. men   Multiple Vitamins-Minerals (ZINC PO) Take 50 mg by mouth daily.   nitroGLYCERIN (NITROSTAT) 0.4 MG SL tablet PLACE 1 TABLET UNDER THE TONGUE EVERY 5 MINUTES AS NEEDED FOR CHEST PAIN   pantoprazole (PROTONIX) 40 MG tablet TAKE 1 TABLET BY MOUTH DAILY TO  PREVENT HEARTBURN AND  INDIGESTION   tamsulosin (FLOMAX) 0.4 MG CAPS capsule Take 0.4 mg by mouth at bedtime.   Vitamin E 450 MG (1000 UT) CAPS Take 450 mg by mouth daily.   HUMULIN 70/30 (70-30) 100 UNIT/ML injection INJECT SUBCUTANEOUSLY 50 UNITS  TWICE DAILY (Patient  not taking: Reported on 11/22/2023)   No facility-administered encounter medications on file as of 11/22/2023.    Past Medical History:  Diagnosis Date   Arthritis    CAD (coronary artery disease)    a.s/p DES to mid LAD and OM2 06/2012.   Chronic back pain    Chronically dry eyes    Diabetes mellitus without complication (HCC)    TYPE 2    DKA (diabetic ketoacidoses) 01/30/2017   Elevated PSA    being monitored   Fatty liver    GERD (gastroesophageal reflux disease)    Hx of radiation therapy    prostate , [facility]    Hypertension    Hypertriglyceridemia    Intermediate coronary syndrome (HCC) 07/26/2012   Nausea vomiting and diarrhea 11/11/2023   Neuromuscular disorder (HCC)    neuropathy feet and legs   Prostate cancer (HCC)  RADTIATION     Past Surgical History:  Procedure Laterality Date   APPENDECTOMY     CARDIAC SURGERY  ~2017   3 stent placed.   COLONOSCOPY  02/12/2010   Buccini   COLONOSCOPY WITH PROPOFOL N/A 09/21/2022   Procedure: COLONOSCOPY WITH PROPOFOL;  Surgeon: Meridee Score Netty Starring., MD;  Location: [facility] ENDOSCOPY;  Service: Gastroenterology;  Laterality: N/A;   ENDOSCOPIC MUCOSAL RESECTION N/A 09/21/2022   Procedure: ENDOSCOPIC MUCOSAL RESECTION;  Surgeon: Meridee Score Netty Starring., MD;  Location: [facility] ENDOSCOPY;  Service: Gastroenterology;  Laterality: N/A;   HEMOSTASIS CLIP PLACEMENT  09/21/2022   Procedure: HEMOSTASIS CLIP PLACEMENT;  Surgeon: Lemar Lofty., MD;  Location: [facility];  Service: Gastroenterology;;   LEFT HEART CATHETERIZATION WITH CORONARY ANGIOGRAM N/A 06/27/2012   Procedure: LEFT HEART CATHETERIZATION WITH CORONARY ANGIOGRAM;  Surgeon: Donato Schultz, MD;  Location: [facility] CATH LAB;  Service: Cardiovascular;  Laterality: N/A;   LEFT HEART CATHETERIZATION WITH CORONARY ANGIOGRAM N/A 07/25/2012   Procedure: LEFT HEART CATHETERIZATION WITH CORONARY ANGIOGRAM;  Surgeon: Quintella Reichert, MD;  Location: [facility] CATH LAB;  Service:  Cardiovascular;  Laterality: N/A;   PROSTATE BIOPSY     RADIOLOGY WITH ANESTHESIA N/A 12/03/2015   Procedure: MRI LUMBAR SPINE;  Surgeon: Medication Radiologist, MD;  Location: [facility] OR;  Service: Radiology;  Laterality: N/A;   SUBMUCOSAL LIFTING INJECTION  09/21/2022   Procedure: SUBMUCOSAL LIFTING INJECTION;  Surgeon: Meridee Score Netty Starring., MD;  Location: [facility];  Service: Gastroenterology;;   TOTAL HIP ARTHROPLASTY Right 04/28/2019   Procedure: TOTAL HIP ARTHROPLASTY ANTERIOR APPROACH;  Surgeon: Jodi Geralds, MD;  Location: [facility] ORS;  Service: Orthopedics;  Laterality: Right;    Family History  Problem Relation Age of Onset   Diabetes Sister    Stomach cancer Neg Hx    Colon cancer Neg Hx    Esophageal cancer Neg Hx    Inflammatory bowel disease Neg Hx    Liver disease Neg Hx    Pancreatic cancer Neg Hx    Rectal cancer Neg Hx     Social History   Socioeconomic History   Marital status: Married    Spouse name: Not on file   Number of children: 4   Years of education: 2 y colleg   Highest education level: Not on file  Occupational History   Occupation: SUPERVISOR    Employer: [employer]   Occupation: retired  Tobacco Use   Smoking status: Former    Current packs/day: 0.00    Average packs/day: 0.8 packs/day for 52.9 years (39.6 ttl pk-yrs)    Types: Cigarettes    Start date: 46    Quit date: 07/27/2011    Years since quitting: 12.3   Smokeless tobacco: Never  Vaping Use   Vaping status: Never Used  Substance and Sexual Activity   Alcohol use: Not Currently    Comment: qut 20 years ago per patient    Drug use: No   Sexual activity: Not Currently  Other Topics Concern   Not on file  Social History Narrative   Works as    Social Drivers of Corporate investment banker Strain: Not on file  Food Insecurity: No Food Insecurity (11/11/2023)   Hunger Vital Sign    Worried About Running Out of Food in the Last Year: Never true    Ran Out of Food in the  Last Year: Never true  Transportation Needs: No Transportation Needs (11/11/2023)   PRAPARE - Administrator, Civil Service (Medical):  No    Lack of Transportation (Non-Medical): No  Physical Activity: Not on file  Stress: Not on file  Social Connections: Patient Declined (11/11/2023)   Social Connection and Isolation Panel [NHANES]    Frequency of Communication with Friends and Family: Patient declined    Frequency of Social Gatherings with Friends and Family: Patient declined    Attends Religious Services: Patient declined    Database administrator or Organizations: Patient declined    Attends Banker Meetings: Patient declined    Marital Status: Patient declined  Intimate Partner Violence: Not At Risk (11/11/2023)   Humiliation, Afraid, Rape, and Kick questionnaire    Fear of Current or Ex-Partner: No    Emotionally Abused: No    Physically Abused: No    Sexually Abused: No    Review of Systems  Respiratory: Negative.    Cardiovascular: Negative.   Gastrointestinal: Negative.   Neurological: Negative.   Endo/Heme/Allergies: Negative.   Psychiatric/Behavioral: Negative.    All other systems reviewed and are negative.       Objective    BP 118/72   Pulse 67   Temp 98.2 F (36.8 C)   Ht 5\' 10"  (1.778 m)   Wt 202 lb 6.4 oz (91.8 kg)   SpO2 96%   BMI 29.04 kg/m   Physical Exam Vitals and nursing note reviewed.  Constitutional:      Appearance: Normal appearance. He is normal weight.  HENT:     Head: Normocephalic and atraumatic.  Cardiovascular:     Rate and Rhythm: Normal rate and regular rhythm.     Pulses: Normal pulses.     Heart sounds: Normal heart sounds.  Pulmonary:     Effort: Pulmonary effort is normal.     Breath sounds: Normal breath sounds.  Skin:    General: Skin is warm and dry.     Capillary Refill: Capillary refill takes less than 2 seconds.  Neurological:     General: No focal deficit present.     Mental Status: He  is alert and oriented to person, place, and time. Mental status is at baseline.  Psychiatric:        Mood and Affect: Mood normal.        Behavior: Behavior normal.        Thought Content: Thought content normal.        Judgment: Judgment normal.     Last CBC Lab Results  Component Value Date   WBC 6.0 11/12/2023   HGB 12.9 (L) 11/12/2023   HCT 38.4 (L) 11/12/2023   MCV 86.5 11/12/2023   MCH 29.1 11/12/2023   RDW 12.1 11/12/2023   PLT 210 11/12/2023   Last metabolic panel Lab Results  Component Value Date   GLUCOSE 102 (H) 11/13/2023   NA 129 (L) 11/13/2023   K 4.0 11/13/2023   CL 98 11/13/2023   CO2 25 11/13/2023   BUN 8 11/13/2023   CREATININE 0.79 11/13/2023   GFRNONAA >60 11/13/2023   CALCIUM 9.2 11/13/2023   PHOS 3.1 11/11/2023   PROT 8.3 (H) 11/11/2023   ALBUMIN 5.2 (H) 11/11/2023   BILITOT 0.7 11/11/2023   ALKPHOS 63 11/11/2023   AST 27 11/11/2023   ALT 23 11/11/2023   ANIONGAP 6 11/13/2023   Last lipids Lab Results  Component Value Date   CHOL 144 08/17/2023   HDL 54 08/17/2023   LDLCALC 63 08/17/2023   LDLDIRECT 45.7 10/30/2013   TRIG 200 (H) 08/17/2023   CHOLHDL  2.7 08/17/2023   Last hemoglobin A1c Lab Results  Component Value Date   HGBA1C 7.8 (H) 08/17/2023   Last thyroid functions Lab Results  Component Value Date   TSH 0.596 11/11/2023   Last vitamin D Lab Results  Component Value Date   VD25OH 54 05/06/2023   Last vitamin B12 and Folate Lab Results  Component Value Date   VITAMINB12 703 12/19/2019        Assessment & Plan:   Problem List Items Addressed This Visit     Hypertension (1991)   Chronic well controlled. May remain off of Metoprolol. Follow up with cardiology. Recommend heart healthy diet such as Mediterranean diet with whole grains, fruits, vegetable, fish, lean meats, nuts, and olive oil. Limit salt. Encouraged moderate walking, 3-5 times/week for 30-50 minutes each session. Aim for at least 150 minutes.week.  Goal should be pace of 3 miles/hours, or walking 1.5 miles in 30 minutes. Avoid tobacco products. Avoid excess alcohol. Take medications as prescribed and bring medications and blood pressure log with cuff to each office visit. Seek medical care for chest pain, palpitations, shortness of breath with exertion, dizziness/lightheadedness, vision changes, recurrent headaches, or swelling of extremities.       GERD   Well controlled on Omeprazole 20mg  daily      ASCAD s/p PTCA (06/2012)   Upcoming stress test with Cardiology.      Hyperlipidemia associated with type 2 diabetes mellitus (HCC)   Continue Atorvastatin. Your labs showed elevated cholesterol. I recommend consuming a heart healthy diet such as Mediterranean diet or DASH diet with whole grains, fruits, vegetable, fish, lean meats, nuts, and olive oil. Limit sweets and processed foods. I also encourage moderate intensity exercise 150 minutes weekly. This is 3-5 times weekly for 30-50 minutes each session. Goal should be pace of 3 miles/hours, or walking 1.5 miles in 30 minutes. The 10-year ASCVD risk score (Arnett DK, et al., 2019) is: 31.1%       Malignant neoplasm of prostate (HCC)   Followed by Urology.       Type 2 diabetes mellitus with vascular disease (HCC)   A1c today. He is unsure of fasting readings but is only taking insulin daily if BG >110. A1c and uACR UTD. Foot exam UTD. Vaccines UTD. Retinal eye exam upcoming. Recommend heart healthy diet such as Mediterranean diet with whole grains, fruits, vegetable, fish, lean meats, nuts, and olive oil. Limit salt. Encouraged moderate walking, 3-5 times/week for 30-50 minutes each session. Aim for at least 150 minutes.week. Goal should be pace of 3 miles/hours, or walking 1.5 miles in 30 minutes. Seek medical care for urinary frequency, extreme thirst, vision changes, lightheadedness, dizziness.  Follow up in 3 months or sooner if needed       Relevant Orders   Hemoglobin A1c    Former smoker   Referral placed for lung cancer screening      Relevant Orders   Ambulatory Referral for Lung Cancer Scre   Hyponatremia - Primary   Recheck BMP today.      Relevant Orders   BASIC METABOLIC PANEL WITH GFR   Depression, recurrent (HCC)   Well controlled on Wellbutrin.        Return in about 3 months (around 02/22/2024) for AWV, chronic follow-up with labs 1 week prior.   Park Meo, [name]

## 2023-11-22 NOTE — Assessment & Plan Note (Signed)
 Recheck BMP today.

## 2023-11-22 NOTE — Assessment & Plan Note (Signed)
Referral placed for lung cancer screening.  

## 2023-11-22 NOTE — Assessment & Plan Note (Signed)
 A1c today. He is unsure of fasting readings but is only taking insulin daily if BG >110. A1c and uACR UTD. Foot exam UTD. Vaccines UTD. Retinal eye exam upcoming. Recommend heart healthy diet such as Mediterranean diet with whole grains, fruits, vegetable, fish, lean meats, nuts, and olive oil. Limit salt. Encouraged moderate walking, 3-5 times/week for 30-50 minutes each session. Aim for at least 150 minutes.week. Goal should be pace of 3 miles/hours, or walking 1.5 miles in 30 minutes. Seek medical care for urinary frequency, extreme thirst, vision changes, lightheadedness, dizziness.  Follow up in 3 months or sooner if needed

## 2023-11-22 NOTE — Patient Instructions (Signed)
 It was great to meet you today and I'm excited to have you join the [facility] practice. I hope you had a positive experience today! If you feel so inclined, please feel free to recommend our practice to friends and family. Kurtis Bushman, [name]-C

## 2023-11-22 NOTE — Assessment & Plan Note (Signed)
 Continue Atorvastatin. Your labs showed elevated cholesterol. I recommend consuming a heart healthy diet such as Mediterranean diet or DASH diet with whole grains, fruits, vegetable, fish, lean meats, nuts, and olive oil. Limit sweets and processed foods. I also encourage moderate intensity exercise 150 minutes weekly. This is 3-5 times weekly for 30-50 minutes each session. Goal should be pace of 3 miles/hours, or walking 1.5 miles in 30 minutes. The 10-year ASCVD risk score (Arnett DK, et al., 2019) is: 31.1%

## 2023-11-22 NOTE — Assessment & Plan Note (Signed)
 Followed by Urology

## 2023-11-23 LAB — HEMOGLOBIN A1C
Hgb A1c MFr Bld: 8.2 %{Hb} — ABNORMAL HIGH (ref <5.7)
Mean Plasma Glucose: 189 mg/dL
eAG (mmol/L): 10.4 mmol/L

## 2023-11-23 LAB — BASIC METABOLIC PANEL WITH GFR
BUN: 15 mg/dL (ref 7–25)
CO2: 24 mmol/L (ref 20–32)
Calcium: 9.8 mg/dL (ref 8.6–10.3)
Chloride: 101 mmol/L (ref 98–110)
Creat: 1.14 mg/dL (ref 0.70–1.28)
Glucose, Bld: 78 mg/dL (ref 65–99)
Potassium: 4.1 mmol/L (ref 3.5–5.3)
Sodium: 136 mmol/L (ref 135–146)
eGFR: 67 mL/min/{1.73_m2} (ref >=60)

## 2023-11-30 ENCOUNTER — Emergency Department (HOSPITAL_COMMUNITY)

## 2023-11-30 ENCOUNTER — Encounter (HOSPITAL_COMMUNITY): Payer: Self-pay | Admitting: Emergency Medicine

## 2023-11-30 ENCOUNTER — Inpatient Hospital Stay (HOSPITAL_COMMUNITY)
Admission: EM | Admit: 2023-11-30 | Discharge: 2023-12-05 | DRG: 641 | Disposition: A | Discharge status: Home / Self Care | Attending: Internal Medicine | Admitting: Internal Medicine

## 2023-11-30 ENCOUNTER — Other Ambulatory Visit: Payer: Self-pay

## 2023-11-30 DIAGNOSIS — E871 Hypo-osmolality and hyponatremia: Principal | ICD-10-CM | POA: Diagnosis present

## 2023-11-30 DIAGNOSIS — Z8546 Personal history of malignant neoplasm of prostate: Secondary | ICD-10-CM

## 2023-11-30 DIAGNOSIS — K219 Gastro-esophageal reflux disease without esophagitis: Secondary | ICD-10-CM | POA: Diagnosis present

## 2023-11-30 DIAGNOSIS — I700 Atherosclerosis of aorta: Secondary | ICD-10-CM | POA: Diagnosis present

## 2023-11-30 DIAGNOSIS — Z791 Long term (current) use of non-steroidal anti-inflammatories (NSAID): Secondary | ICD-10-CM | POA: Diagnosis not present

## 2023-11-30 DIAGNOSIS — G8929 Other chronic pain: Secondary | ICD-10-CM | POA: Diagnosis present

## 2023-11-30 DIAGNOSIS — E1169 Type 2 diabetes mellitus with other specified complication: Secondary | ICD-10-CM | POA: Diagnosis present

## 2023-11-30 DIAGNOSIS — Z7982 Long term (current) use of aspirin: Secondary | ICD-10-CM

## 2023-11-30 DIAGNOSIS — R112 Nausea with vomiting, unspecified: Secondary | ICD-10-CM | POA: Diagnosis present

## 2023-11-30 DIAGNOSIS — K760 Fatty (change of) liver, not elsewhere classified: Secondary | ICD-10-CM | POA: Diagnosis present

## 2023-11-30 DIAGNOSIS — Z955 Presence of coronary angioplasty implant and graft: Secondary | ICD-10-CM | POA: Diagnosis not present

## 2023-11-30 DIAGNOSIS — Z7984 Long term (current) use of oral hypoglycemic drugs: Secondary | ICD-10-CM

## 2023-11-30 DIAGNOSIS — Z79899 Other long term (current) drug therapy: Secondary | ICD-10-CM

## 2023-11-30 DIAGNOSIS — Z923 Personal history of irradiation: Secondary | ICD-10-CM | POA: Diagnosis not present

## 2023-11-30 DIAGNOSIS — M1990 Unspecified osteoarthritis, unspecified site: Secondary | ICD-10-CM | POA: Diagnosis present

## 2023-11-30 DIAGNOSIS — Z833 Family history of diabetes mellitus: Secondary | ICD-10-CM | POA: Diagnosis not present

## 2023-11-30 DIAGNOSIS — Z794 Long term (current) use of insulin: Secondary | ICD-10-CM | POA: Diagnosis not present

## 2023-11-30 DIAGNOSIS — E869 Volume depletion, unspecified: Secondary | ICD-10-CM | POA: Diagnosis present

## 2023-11-30 DIAGNOSIS — E222 Syndrome of inappropriate secretion of antidiuretic hormone: Secondary | ICD-10-CM | POA: Diagnosis present

## 2023-11-30 DIAGNOSIS — F339 Major depressive disorder, recurrent, unspecified: Secondary | ICD-10-CM | POA: Diagnosis present

## 2023-11-30 DIAGNOSIS — Z96641 Presence of right artificial hip joint: Secondary | ICD-10-CM | POA: Diagnosis present

## 2023-11-30 DIAGNOSIS — E781 Pure hyperglyceridemia: Secondary | ICD-10-CM | POA: Diagnosis present

## 2023-11-30 DIAGNOSIS — N400 Enlarged prostate without lower urinary tract symptoms: Secondary | ICD-10-CM | POA: Diagnosis present

## 2023-11-30 DIAGNOSIS — M549 Dorsalgia, unspecified: Secondary | ICD-10-CM | POA: Diagnosis present

## 2023-11-30 DIAGNOSIS — I2510 Atherosclerotic heart disease of native coronary artery without angina pectoris: Secondary | ICD-10-CM | POA: Diagnosis present

## 2023-11-30 DIAGNOSIS — I10 Essential (primary) hypertension: Secondary | ICD-10-CM | POA: Diagnosis present

## 2023-11-30 DIAGNOSIS — Z87891 Personal history of nicotine dependence: Secondary | ICD-10-CM | POA: Diagnosis not present

## 2023-11-30 LAB — CBC
HCT: 37.7 % — ABNORMAL LOW (ref 39.0–52.0)
Hemoglobin: 12.8 g/dL — ABNORMAL LOW (ref 13.0–17.0)
MCH: 29.2 pg (ref 26.0–34.0)
MCHC: 34 g/dL (ref 30.0–36.0)
MCV: 86.1 fL (ref 80.0–100.0)
Platelets: 228 10*3/uL (ref 150–400)
RBC: 4.38 MIL/uL (ref 4.22–5.81)
RDW: 12.2 % (ref 11.5–15.5)
WBC: 6.6 10*3/uL (ref 4.0–10.5)
nRBC: 0 % (ref 0.0–0.2)

## 2023-11-30 LAB — COMPREHENSIVE METABOLIC PANEL
ALT: 22 U/L (ref 0–44)
AST: 24 U/L (ref 15–41)
Albumin: 4.7 g/dL (ref 3.5–5.0)
Alkaline Phosphatase: 55 U/L (ref 38–126)
Anion gap: 12 (ref 5–15)
BUN: 12 mg/dL (ref 8–23)
CO2: 20 mmol/L — ABNORMAL LOW (ref 22–32)
Calcium: 9.3 mg/dL (ref 8.9–10.3)
Chloride: 84 mmol/L — ABNORMAL LOW (ref 98–111)
Creatinine, Ser: 0.67 mg/dL (ref 0.61–1.24)
GFR, Estimated: >60 mL/min (ref >60)
Glucose, Bld: 159 mg/dL — ABNORMAL HIGH (ref 70–99)
Potassium: 4.3 mmol/L (ref 3.5–5.1)
Sodium: 116 mmol/L — CL (ref 135–145)
Total Bilirubin: 1 mg/dL (ref 0.0–1.2)
Total Protein: 8 g/dL (ref 6.5–8.1)

## 2023-11-30 LAB — CBG MONITORING, ED: Glucose-Capillary: 178 mg/dL — ABNORMAL HIGH (ref 70–99)

## 2023-11-30 LAB — LIPASE, BLOOD: Lipase: 23 U/L (ref 11–51)

## 2023-11-30 LAB — URINALYSIS, ROUTINE W REFLEX MICROSCOPIC
Bilirubin Urine: NEGATIVE
Glucose, UA: NEGATIVE mg/dL
Hgb urine dipstick: NEGATIVE
Ketones, ur: 5 mg/dL — AB
Leukocytes,Ua: NEGATIVE
Nitrite: NEGATIVE
Protein, ur: NEGATIVE mg/dL
Specific Gravity, Urine: 1.023 (ref 1.005–1.030)
pH: 7 (ref 5.0–8.0)

## 2023-11-30 LAB — RESP PANEL BY RT-PCR (RSV, FLU A&B, COVID)  RVPGX2
Influenza A by PCR: NEGATIVE
Influenza B by PCR: NEGATIVE
Resp Syncytial Virus by PCR: NEGATIVE
SARS Coronavirus 2 by RT PCR: NEGATIVE

## 2023-11-30 MED ORDER — ENOXAPARIN SODIUM 40 MG/0.4ML IJ SOSY
40.0000 mg | PREFILLED_SYRINGE | INTRAMUSCULAR | Status: DC
  Administered 2023-12-01 – 2023-12-05 (×5): 40 mg via SUBCUTANEOUS
  Filled 2023-11-30 (×5): qty 0.4

## 2023-11-30 MED ORDER — TAMSULOSIN HCL 0.4 MG PO CAPS
0.4000 mg | ORAL_CAPSULE | Freq: Every day | ORAL | Status: DC
  Administered 2023-12-01 – 2023-12-04 (×5): 0.4 mg via ORAL
  Filled 2023-11-30 (×5): qty 1

## 2023-11-30 MED ORDER — ONDANSETRON HCL 4 MG PO TABS: 4.0000 mg | ORAL_TABLET | Freq: Four times a day (QID) | ORAL | Status: DC | PRN

## 2023-11-30 MED ORDER — ATORVASTATIN CALCIUM 10 MG PO TABS
10.0000 mg | ORAL_TABLET | Freq: Every day | ORAL | Status: DC
  Administered 2023-12-01 – 2023-12-05 (×5): 10 mg via ORAL
  Filled 2023-11-30 (×5): qty 1

## 2023-11-30 MED ORDER — SODIUM CHLORIDE 0.9 % IV SOLN: INTRAVENOUS | Status: DC

## 2023-11-30 MED ORDER — BUPROPION HCL ER (XL) 150 MG PO TB24: 300.0000 mg | ORAL_TABLET | Freq: Every day | ORAL | Status: DC

## 2023-11-30 MED ORDER — ACETAMINOPHEN 650 MG RE SUPP: 650.0000 mg | Freq: Four times a day (QID) | RECTAL | Status: DC | PRN

## 2023-11-30 MED ORDER — GLYCOPYRROLATE 1 MG PO TABS
2.0000 mg | ORAL_TABLET | Freq: Three times a day (TID) | ORAL | Status: DC
  Administered 2023-12-01 – 2023-12-05 (×14): 2 mg via ORAL
  Filled 2023-11-30 (×16): qty 2

## 2023-11-30 MED ORDER — ONDANSETRON HCL 4 MG/2ML IJ SOLN
4.0000 mg | Freq: Four times a day (QID) | INTRAMUSCULAR | Status: DC | PRN
  Administered 2023-12-01: 4 mg via INTRAVENOUS
  Filled 2023-11-30: qty 2

## 2023-11-30 MED ORDER — ACETAMINOPHEN 325 MG PO TABS
650.0000 mg | ORAL_TABLET | Freq: Four times a day (QID) | ORAL | Status: DC | PRN
  Administered 2023-12-01 – 2023-12-03 (×5): 650 mg via ORAL
  Filled 2023-11-30 (×5): qty 2

## 2023-11-30 MED ORDER — METOPROLOL SUCCINATE ER 25 MG PO TB24
12.5000 mg | ORAL_TABLET | Freq: Every day | ORAL | Status: DC
  Administered 2023-12-01 – 2023-12-05 (×5): 12.5 mg via ORAL
  Filled 2023-11-30 (×5): qty 1

## 2023-11-30 MED ORDER — IOHEXOL 300 MG/ML  SOLN
100.0000 mL | Freq: Once | INTRAMUSCULAR | Status: AC | PRN
  Administered 2023-11-30: 100 mL via INTRAVENOUS

## 2023-11-30 NOTE — ED Provider Notes (Signed)
 [place] [facility]MENT AT [facility] Provider Note   CSN: [id number] Arrival date & time: 11/30/23  1858     History  Chief Complaint  Patient presents with   Abdominal Pain    Michael Guzman is a 78 y.o. male.  78 year old male presents with 1 day of nausea vomiting as well as diarrhea.  History of hyponatremia in the past with associated symptoms.  Denies any fever or chills.  Patient's emesis has been nonbilious or bloody.  He endorses diffuse weakness.  Unable to keep down orals.  No treatment use prior to arrival.  Of note, patient discharged in the hospital approximate 17 days ago due to hyponatremia secondary to hypovolemia.   Abdominal Pain      Home Medications Prior to Admission medications   Medication Sig Start Date End Date Taking? Authorizing Provider  Ascorbic Acid (VITAMIN C) 1000 MG tablet Take 1,000 mg by mouth daily.    [provider]  aspirin EC 81 MG tablet Take 1 tablet (81 mg total) by mouth daily. 10/27/19   Jake Bathe, MD  atorvastatin (LIPITOR) 10 MG tablet TAKE 1 TABLET BY MOUTH DAILY 06/23/23   Raynelle Dick, NP  B Complex-C (SUPER B COMPLEX PO) Take 1 tablet by mouth daily.    [provider]  Blood Glucose Monitoring Suppl (ONE TOUCH ULTRA 2) w/Device KIT Use as directed 03/12/22   Raynelle Dick, NP  buPROPion (WELLBUTRIN XL) 300 MG 24 hr tablet TAKE 1 TABLET BY MOUTH DAILY FOR MOOD, FOCUS AND CONCENTRATION 06/16/23   Adela Glimpse, NP  Cholecalciferol (VITAMIN D3) 25 MCG (1000 UT) CAPS Take 10,000 Units by mouth daily. 250 mcg    [provider]  gabapentin (NEURONTIN) 800 MG tablet TAKE 1 TABLET BY MOUTH 3 TO 4  TIMES DAILY FOR PAIN, HOT  FLASHES AND SWEATS 04/05/23   Raynelle Dick, NP  glucose blood (ONETOUCH ULTRA) test strip CHECK BLOOD SUGAR 3 TIMES  DAILY 11/27/21   Raynelle Dick, NP  glycopyrrolate (ROBINUL) 2 MG tablet Take  1 tablet  3 x / day for Excessive Sweating  TAKE  ONE TABLET BY                                                 MOUTH 12/24/22   Lucky Cowboy, MD  HUMULIN 70/30 (70-30) 100 UNIT/ML injection INJECT SUBCUTANEOUSLY 50 UNITS  TWICE DAILY Patient not taking: Reported on 11/22/2023 06/23/22   Adela Glimpse, NP  Insulin Syringes, Disposable, U-100 1 ML MISC 50 Units by Does not apply route 2 (two) times daily. inject 50 units into skin 2 x /day 07/23/20   Lucky Cowboy, MD  Lancets [facility] DELICA PLUS [place]) MISC CHECK BLOOD SUGAR 3 TIMES  DAILY 07/09/22   Lucky Cowboy, MD  Magnesium 500 MG TABS Take 500 mg by mouth daily.    [provider]  meloxicam (MOBIC) 15 MG tablet Take 15 mg by mouth daily. 10/27/23   [provider]  metFORMIN (GLUCOPHAGE-XR) 500 MG 24 hr tablet TAKE 2 TABLETS BY MOUTH TWICE  DAILY WITH MEALS FOR DIABETES 06/23/23   Raynelle Dick, NP  metoprolol succinate (TOPROL-XL) 25 MG 24 hr tablet TAKE ONE-HALF TABLET BY MOUTH  DAILY 10/19/23   Jake Bathe, MD  montelukast (SINGULAIR) 10 MG tablet TAKE  1 TABLET BY MOUTH DAILY FOR ALLERGIES 04/05/23   Raynelle Dick, NP  Multiple Vitamin (MULTIVITAMIN WITH MINERALS) TABS tablet Take 1 tablet by mouth daily. men    [provider]  Multiple Vitamins-Minerals (ZINC PO) Take 50 mg by mouth daily.    [provider]  nitroGLYCERIN (NITROSTAT) 0.4 MG SL tablet PLACE 1 TABLET UNDER THE TONGUE EVERY 5 MINUTES AS NEEDED FOR CHEST PAIN 08/31/22   Raynelle Dick, NP  pantoprazole (PROTONIX) 40 MG tablet TAKE 1 TABLET BY MOUTH DAILY TO  PREVENT HEARTBURN AND  INDIGESTION 04/05/23   Raynelle Dick, NP  tamsulosin (FLOMAX) 0.4 MG CAPS capsule Take 0.4 mg by mouth at bedtime.    [provider]  Vitamin E 450 MG (1000 UT) CAPS Take 450 mg by mouth daily.    [provider]      Allergies    Patient has no known allergies.    Review of Systems   Review of Systems  Gastrointestinal:  Positive for abdominal pain.  All  other systems reviewed and are negative.   Physical Exam Updated Vital Signs BP (!) 134/90   Pulse (!) 57   Temp 98.2 F (36.8 C) (Oral)   Resp 16   SpO2 100%  Physical Exam Vitals and nursing note reviewed.  Constitutional:      General: He is not in acute distress.    Appearance: Normal appearance. He is well-developed. He is not toxic-appearing.  HENT:     Head: Normocephalic and atraumatic.  Eyes:     General: Lids are normal.     Conjunctiva/sclera: Conjunctivae normal.     Pupils: Pupils are equal, round, and reactive to light.  Neck:     Thyroid: No thyroid mass.     Trachea: No tracheal deviation.  Cardiovascular:     Rate and Rhythm: Normal rate and regular rhythm.     Heart sounds: Normal heart sounds. No murmur heard.    No gallop.  Pulmonary:     Effort: Pulmonary effort is normal. No respiratory distress.     Breath sounds: Normal breath sounds. No stridor. No decreased breath sounds, wheezing, rhonchi or rales.  Abdominal:     General: There is no distension.     Palpations: Abdomen is soft.     Tenderness: There is generalized abdominal tenderness. There is no rebound.  Musculoskeletal:        General: No tenderness. Normal range of motion.     Cervical back: Normal range of motion and neck supple.  Skin:    General: Skin is warm and dry.     Findings: No abrasion or rash.  Neurological:     Mental Status: He is alert and oriented to person, place, and time. Mental status is at baseline.     GCS: GCS eye subscore is 4. GCS verbal subscore is 5. GCS motor subscore is 6.     Cranial Nerves: No cranial nerve deficit.     Sensory: No sensory deficit.     Motor: Motor function is intact.  Psychiatric:        Attention and Perception: He is inattentive.        Mood and Affect: Affect is blunt.        Speech: Speech is delayed.        Behavior: Behavior is withdrawn.     ED Results / Procedures / Treatments   Labs (all labs ordered are listed, but  only abnormal results  are displayed) Labs Reviewed  COMPREHENSIVE METABOLIC PANEL - Abnormal; Notable for the following components:      Result Value   Sodium 116 (*)    Chloride 84 (*)    CO2 20 (*)    Glucose, Bld 159 (*)    All other components within normal limits  CBC - Abnormal; Notable for the following components:   Hemoglobin 12.8 (*)    HCT 37.7 (*)    All other components within normal limits  CBG MONITORING, ED - Abnormal; Notable for the following components:   Glucose-Capillary 178 (*)    All other components within normal limits  RESP PANEL BY RT-PCR (RSV, FLU A&B, COVID)  RVPGX2  LIPASE, BLOOD  URINALYSIS, ROUTINE W REFLEX MICROSCOPIC    EKG None  Radiology No results found.  Procedures Procedures    Medications Ordered in ED Medications  0.9 %  sodium chloride infusion ( Intravenous New Bag/Given 11/30/23 2058)    ED Course/ Medical Decision Making/ A&P                                 Medical Decision Making Amount and/or Complexity of Data Reviewed Labs: ordered. Radiology: ordered.  Risk Prescription drug management.   Patient's COVID flu test negative here.  Patient found to be hyponatremic with serum of 116.  Given IV saline here.  Had some abdominal discomfort and abdominal CT showed no acute findings.  Will be admitted for treatment.  Will consult hospitalist team        Final Clinical Impression(s) / ED Diagnoses Final diagnoses:  None    Rx / DC Orders ED Discharge Orders     None         Lorre Nick, MD 11/30/23 2313

## 2023-11-30 NOTE — ED Triage Notes (Addendum)
 Patient c/o abdominal pain x2 days. Patient report unable to keep anything down since yesterday. Patient report loose stool x 3 today.  Patient denies fever.

## 2023-12-01 DIAGNOSIS — E871 Hypo-osmolality and hyponatremia: Secondary | ICD-10-CM

## 2023-12-01 LAB — BASIC METABOLIC PANEL
Anion gap: 9 (ref 5–15)
Anion gap: 9 (ref 5–15)
Anion gap: 9 (ref 5–15)
Anion gap: 8 (ref 5–15)
BUN: 10 mg/dL (ref 8–23)
BUN: 10 mg/dL (ref 8–23)
BUN: 11 mg/dL (ref 8–23)
BUN: 14 mg/dL (ref 8–23)
CO2: 18 mmol/L — ABNORMAL LOW (ref 22–32)
CO2: 18 mmol/L — ABNORMAL LOW (ref 22–32)
CO2: 20 mmol/L — ABNORMAL LOW (ref 22–32)
CO2: 22 mmol/L (ref 22–32)
Calcium: 8.3 mg/dL — ABNORMAL LOW (ref 8.9–10.3)
Calcium: 7.5 mg/dL — ABNORMAL LOW (ref 8.9–10.3)
Calcium: 7.8 mg/dL — ABNORMAL LOW (ref 8.9–10.3)
Calcium: 9.1 mg/dL (ref 8.9–10.3)
Chloride: 93 mmol/L — ABNORMAL LOW (ref 98–111)
Chloride: 96 mmol/L — ABNORMAL LOW (ref 98–111)
Chloride: 93 mmol/L — ABNORMAL LOW (ref 98–111)
Chloride: 88 mmol/L — ABNORMAL LOW (ref 98–111)
Creatinine, Ser: 0.7 mg/dL (ref 0.61–1.24)
Creatinine, Ser: 0.69 mg/dL (ref 0.61–1.24)
Creatinine, Ser: 0.78 mg/dL (ref 0.61–1.24)
Creatinine, Ser: 0.98 mg/dL (ref 0.61–1.24)
GFR, Estimated: >60 mL/min (ref >60)
GFR, Estimated: >60 mL/min (ref >60)
GFR, Estimated: >60 mL/min (ref >60)
GFR, Estimated: >60 mL/min (ref >60)
Glucose, Bld: 101 mg/dL — ABNORMAL HIGH (ref 70–99)
Glucose, Bld: 103 mg/dL — ABNORMAL HIGH (ref 70–99)
Glucose, Bld: 130 mg/dL — ABNORMAL HIGH (ref 70–99)
Glucose, Bld: 154 mg/dL — ABNORMAL HIGH (ref 70–99)
Potassium: 4.2 mmol/L (ref 3.5–5.1)
Potassium: 3.5 mmol/L (ref 3.5–5.1)
Potassium: 3.7 mmol/L (ref 3.5–5.1)
Potassium: 4.3 mmol/L (ref 3.5–5.1)
Sodium: 120 mmol/L — ABNORMAL LOW (ref 135–145)
Sodium: 123 mmol/L — ABNORMAL LOW (ref 135–145)
Sodium: 122 mmol/L — ABNORMAL LOW (ref 135–145)
Sodium: 118 mmol/L — CL (ref 135–145)

## 2023-12-01 LAB — GASTROINTESTINAL PANEL BY PCR, STOOL (REPLACES STOOL CULTURE)

## 2023-12-01 LAB — CBC
HCT: 35.8 % — ABNORMAL LOW (ref 39.0–52.0)
Hemoglobin: 11.9 g/dL — ABNORMAL LOW (ref 13.0–17.0)
MCH: 29.2 pg (ref 26.0–34.0)
MCHC: 33.2 g/dL (ref 30.0–36.0)
MCV: 88 fL (ref 80.0–100.0)
Platelets: 202 10*3/uL (ref 150–400)
RBC: 4.07 MIL/uL — ABNORMAL LOW (ref 4.22–5.81)
RDW: 12.6 % (ref 11.5–15.5)
WBC: 5.7 10*3/uL (ref 4.0–10.5)
nRBC: 0 % (ref 0.0–0.2)

## 2023-12-01 LAB — CBG MONITORING, ED
Glucose-Capillary: 117 mg/dL — ABNORMAL HIGH (ref 70–99)
Glucose-Capillary: 154 mg/dL — ABNORMAL HIGH (ref 70–99)

## 2023-12-01 LAB — GLUCOSE, CAPILLARY: Glucose-Capillary: 110 mg/dL — ABNORMAL HIGH (ref 70–99)

## 2023-12-01 MED ORDER — INSULIN ASPART 100 UNIT/ML IJ SOLN
0.0000 [IU] | Freq: Three times a day (TID) | INTRAMUSCULAR | Status: DC
  Administered 2023-12-01: 2 [IU] via SUBCUTANEOUS
  Administered 2023-12-02: 4 [IU] via SUBCUTANEOUS
  Administered 2023-12-02 – 2023-12-03 (×2): 2 [IU] via SUBCUTANEOUS
  Administered 2023-12-04: 4 [IU] via SUBCUTANEOUS
  Filled 2023-12-01: qty 0.24

## 2023-12-01 MED ORDER — KETOROLAC TROMETHAMINE 15 MG/ML IJ SOLN
15.0000 mg | Freq: Four times a day (QID) | INTRAMUSCULAR | Status: DC | PRN
  Administered 2023-12-01: 15 mg via INTRAVENOUS
  Filled 2023-12-01: qty 1

## 2023-12-01 MED ORDER — NEPRO/CARBSTEADY PO LIQD
237.0000 mL | Freq: Two times a day (BID) | ORAL | Status: DC
  Administered 2023-12-02 – 2023-12-03 (×3): 237 mL via ORAL
  Filled 2023-12-01 (×8): qty 237

## 2023-12-01 MED ORDER — INSULIN GLARGINE 100 UNIT/ML ~~LOC~~ SOLN
20.0000 [IU] | Freq: Every day | SUBCUTANEOUS | Status: DC
  Administered 2023-12-02 – 2023-12-05 (×4): 20 [IU] via SUBCUTANEOUS
  Filled 2023-12-01 (×5): qty 0.2

## 2023-12-01 MED ORDER — SODIUM CHLORIDE 0.9 % IV BOLUS
1000.0000 mL | Freq: Once | INTRAVENOUS | Status: AC
  Administered 2023-12-01: 1000 mL via INTRAVENOUS

## 2023-12-01 MED ORDER — TRAMADOL HCL 50 MG PO TABS
50.0000 mg | ORAL_TABLET | Freq: Four times a day (QID) | ORAL | Status: DC | PRN
  Administered 2023-12-01: 50 mg via ORAL
  Filled 2023-12-01: qty 1

## 2023-12-01 NOTE — H&P (Signed)
 History and Physical    Michael Guzman NWG:[id number] DOB: [birth year] DOA: 11/30/2023  PCP: Park Meo, [name]   Chief Complaint:  abd pain  HPI: Michael Guzman is a 78 y.o. male with medical history significant of CAD, type 2 diabetes, GERD, hypertension, hyperlipidemia who presents emerged part due to nausea vomiting diarrhea.  Patient has been vomiting and difficulty taking food down.  He was recently hospitalized in February for similar complaints.  During this hospitalization he presented with nausea vomiting diarrhea found to have a sodium of 119.  He had poor p.o. intake.  He was volume resuscitated with improvement in his sodium.  He was discharged with outpatient follow-up.  On arrival to the emergency department he was afebrile and hemodynamically stable.  Labs were obtained which showed sodium 116, glucose 159, WBC 6.6, hemoglobin 12.8 respiratory viral panel negative, urinalysis for infection.  Patient underwent CT abdomen pelvis which showed no acute findings   Review of Systems: Review of Systems  Constitutional: Negative.   HENT: Negative.    Eyes: Negative.   Respiratory: Negative.    Cardiovascular: Negative.   Gastrointestinal:  Positive for abdominal pain, diarrhea, nausea and vomiting.  Genitourinary: Negative.   Musculoskeletal: Negative.   Skin: Negative.   Neurological: Negative.   Endo/Heme/Allergies: Negative.   Psychiatric/Behavioral: Negative.    All other systems reviewed and are negative.    As per HPI otherwise 10 point review of systems negative.   No Known Allergies  Past Medical History:  Diagnosis Date   Arthritis    CAD (coronary artery disease)    a.s/p DES to mid LAD and OM2 06/2012.   Chronic back pain    Chronically dry eyes    Diabetes mellitus without complication (HCC)    TYPE 2    DKA (diabetic ketoacidoses) 01/30/2017   Elevated PSA    being monitored   Fatty liver    GERD (gastroesophageal reflux disease)    Hx of  radiation therapy    prostate , [facility]    Hypertension    Hypertriglyceridemia    Intermediate coronary syndrome (HCC) 07/26/2012   Nausea vomiting and diarrhea 11/11/2023   Neuromuscular disorder (HCC)    neuropathy feet and legs   Prostate cancer (HCC)    RADTIATION     Past Surgical History:  Procedure Laterality Date   APPENDECTOMY     CARDIAC SURGERY  ~2017   3 stent placed.   COLONOSCOPY  02/12/2010   Buccini   COLONOSCOPY WITH PROPOFOL N/A 09/21/2022   Procedure: COLONOSCOPY WITH PROPOFOL;  Surgeon: Meridee Score Netty Starring., MD;  Location: [facility] ENDOSCOPY;  Service: Gastroenterology;  Laterality: N/A;   ENDOSCOPIC MUCOSAL RESECTION N/A 09/21/2022   Procedure: ENDOSCOPIC MUCOSAL RESECTION;  Surgeon: Meridee Score Netty Starring., MD;  Location: [facility] ENDOSCOPY;  Service: Gastroenterology;  Laterality: N/A;   HEMOSTASIS CLIP PLACEMENT  09/21/2022   Procedure: HEMOSTASIS CLIP PLACEMENT;  Surgeon: Lemar Lofty., MD;  Location: [facility];  Service: Gastroenterology;;   LEFT HEART CATHETERIZATION WITH CORONARY ANGIOGRAM N/A 06/27/2012   Procedure: LEFT HEART CATHETERIZATION WITH CORONARY ANGIOGRAM;  Surgeon: Donato Schultz, MD;  Location: [facility] CATH LAB;  Service: Cardiovascular;  Laterality: N/A;   LEFT HEART CATHETERIZATION WITH CORONARY ANGIOGRAM N/A 07/25/2012   Procedure: LEFT HEART CATHETERIZATION WITH CORONARY ANGIOGRAM;  Surgeon: Quintella Reichert, MD;  Location: [facility] CATH LAB;  Service: Cardiovascular;  Laterality: N/A;   PROSTATE BIOPSY     RADIOLOGY WITH ANESTHESIA N/A 12/03/2015   Procedure:  MRI LUMBAR SPINE;  Surgeon: Medication Radiologist, MD;  Location: [facility] OR;  Service: Radiology;  Laterality: N/A;   SUBMUCOSAL LIFTING INJECTION  09/21/2022   Procedure: SUBMUCOSAL LIFTING INJECTION;  Surgeon: Meridee Score Netty Starring., MD;  Location: [facility];  Service: Gastroenterology;;   TOTAL HIP ARTHROPLASTY Right 04/28/2019   Procedure: TOTAL HIP ARTHROPLASTY ANTERIOR  APPROACH;  Surgeon: Jodi Geralds, MD;  Location: [facility] ORS;  Service: Orthopedics;  Laterality: Right;     reports that he quit smoking about 12 years ago. His smoking use included cigarettes. He started smoking about 65 years ago. He has a 39.6 pack-year smoking history. He has never used smokeless tobacco. He reports that he does not currently use alcohol. He reports that he does not use drugs.  Family History  Problem Relation Age of Onset   Diabetes Sister    Stomach cancer Neg Hx    Colon cancer Neg Hx    Esophageal cancer Neg Hx    Inflammatory bowel disease Neg Hx    Liver disease Neg Hx    Pancreatic cancer Neg Hx    Rectal cancer Neg Hx     Prior to Admission medications   Medication Sig Start Date End Date Taking? Authorizing Provider  Ascorbic Acid (VITAMIN C) 1000 MG tablet Take 1,000 mg by mouth daily.   Yes [provider]  aspirin EC 81 MG tablet Take 1 tablet (81 mg total) by mouth daily. 10/27/19  Yes Jake Bathe, MD  atorvastatin (LIPITOR) 10 MG tablet TAKE 1 TABLET BY MOUTH DAILY 06/23/23  Yes Raynelle Dick, NP  B Complex-C (SUPER B COMPLEX PO) Take 1 tablet by mouth daily.   Yes [provider]  buPROPion (WELLBUTRIN XL) 300 MG 24 hr tablet TAKE 1 TABLET BY MOUTH DAILY FOR MOOD, FOCUS AND CONCENTRATION 06/16/23  Yes Cranford, Tonya, NP  Cholecalciferol (VITAMIN D3) 250 MCG (10000 UT) capsule Take 10,000 Units by mouth daily.   Yes [provider]  gabapentin (NEURONTIN) 800 MG tablet TAKE 1 TABLET BY MOUTH 3 TO 4  TIMES DAILY FOR PAIN, HOT  FLASHES AND SWEATS Patient taking differently: Take 800 mg by mouth 2 (two) times daily. FOR PAIN, HOT FLASHES AND SWEATS 04/05/23  Yes Raynelle Dick, NP  glycopyrrolate (ROBINUL) 2 MG tablet Take  1 tablet  3 x / day for Excessive Sweating  TAKE ONE TABLET BY                                                 MOUTH 12/24/22  Yes Lucky Cowboy, MD  HUMULIN 70/30 (70-30) 100 UNIT/ML injection INJECT  SUBCUTANEOUSLY 50 UNITS  TWICE DAILY Patient taking differently: Inject 30 Units into the skin daily. AILY 06/23/22  Yes Cranford, Archie Patten, NP  Magnesium 500 MG TABS Take 500 mg by mouth daily.   Yes [provider]  meloxicam (MOBIC) 15 MG tablet Take 15 mg by mouth daily. 10/27/23  Yes [provider]  metFORMIN (GLUCOPHAGE-XR) 500 MG 24 hr tablet TAKE 2 TABLETS BY MOUTH TWICE  DAILY WITH MEALS FOR DIABETES 06/23/23  Yes Raynelle Dick, NP  montelukast (SINGULAIR) 10 MG tablet TAKE 1 TABLET BY MOUTH DAILY FOR ALLERGIES 04/05/23  Yes Raynelle Dick, NP  pantoprazole (PROTONIX) 40 MG tablet TAKE 1 TABLET BY MOUTH DAILY TO  PREVENT HEARTBURN AND  INDIGESTION  04/05/23  Yes Raynelle Dick, NP  tamsulosin (FLOMAX) 0.4 MG CAPS capsule Take 0.4 mg by mouth at bedtime.   Yes [provider]  Vitamin E 450 MG (1000 UT) CAPS Take 450 mg by mouth daily.   Yes [provider]  zinc gluconate 50 MG tablet Take 50 mg by mouth daily.   Yes [provider]  Blood Glucose Monitoring Suppl (ONE TOUCH ULTRA 2) w/Device KIT Use as directed 03/12/22   Raynelle Dick, NP  glucose blood (ONETOUCH ULTRA) test strip CHECK BLOOD SUGAR 3 TIMES  DAILY 11/27/21   Raynelle Dick, NP  Insulin Syringes, Disposable, U-100 1 ML MISC 50 Units by Does not apply route 2 (two) times daily. inject 50 units into skin 2 x /day 07/23/20   Lucky Cowboy, MD  Lancets [facility] DELICA PLUS [place]) MISC CHECK BLOOD SUGAR 3 TIMES  DAILY 07/09/22   Lucky Cowboy, MD  metoprolol succinate (TOPROL-XL) 25 MG 24 hr tablet TAKE ONE-HALF TABLET BY MOUTH  DAILY 10/19/23   Jake Bathe, MD  nitroGLYCERIN (NITROSTAT) 0.4 MG SL tablet PLACE 1 TABLET UNDER THE TONGUE EVERY 5 MINUTES AS NEEDED FOR CHEST PAIN 08/31/22   Raynelle Dick, NP    Physical Exam: Vitals:   11/30/23 2230 11/30/23 2245 11/30/23 2300 12/01/23 0017  BP: (!) 159/89 (!) 143/85 (!) 143/83   Pulse: (!) 53 (!) 55 67    Resp: 12 12 17    Temp:    98.5 F (36.9 C)  TempSrc:    Oral  SpO2: 99% 98% 98%    Physical Exam Constitutional:      General: He is not in acute distress.    Appearance: He is normal weight.  HENT:     Head: Normocephalic.     Mouth/Throat:     Mouth: Mucous membranes are moist.  Cardiovascular:     Rate and Rhythm: Normal rate and regular rhythm.     Heart sounds: Normal heart sounds.  Pulmonary:     Effort: Pulmonary effort is normal.     Breath sounds: Normal breath sounds.  Abdominal:     General: Abdomen is flat. Bowel sounds are normal.  Skin:    General: Skin is warm.     Capillary Refill: Capillary refill takes less than 2 seconds.  Neurological:     General: No focal deficit present.     Mental Status: He is alert.        Labs on Admission: I have personally reviewed the patients's labs and imaging studies.  Assessment/Plan Principal Problem:   Hyponatremia   # Acute symptomatic hyponatremia -N,V,D -Admission in February for similar complaints - At this time patient's sodium was greater than 100 -patient is on bupropion Plan: Patient was started on IV fluids prior to repeat urinary studies.  My suspicion for SIADH is high given his elevated urine sodium on prior presentation Stop IV fluids and fluid restrict Patient's sodium was never corrected during prior admission.  May need nephrology consultation if does not respond to fluid restriction  # CAD-continue aspirin  # Hyperlipidemia-continue Lipitor  # Depression-hold Wellbutrin  # Chronic Pain-hold gabapentin  # Type 2 diabetes-patient takes 30 units of 7030 daily.  Will place on Lantus and sliding scale  # Hypertension-continue metoprolol   Admission status: Inpatient Progressive  Certification: The appropriate patient status for this patient is INPATIENT. Inpatient status is judged to be reasonable and necessary in order to provide the required intensity of service  to ensure the  patient's safety. The patient's presenting symptoms, physical exam findings, and initial radiographic and laboratory data in the context of their chronic comorbidities is felt to place them at high risk for further clinical deterioration. Furthermore, it is not anticipated that the patient will be medically stable for discharge from the hospital within 2 midnights of admission.   * I certify that at the point of admission it is my clinical judgment that the patient will require inpatient hospital care spanning beyond 2 midnights from the point of admission due to high intensity of service, high risk for further deterioration and high frequency of surveillance required.Alan Mulder MD [facility] If 7PM-7AM, please contact night-coverage [email or web address]  12/01/2023, 12:24 AM

## 2023-12-01 NOTE — ED Notes (Signed)
 Pts family asked to be called when the admission plan is being made and also before he is moved upstairs.

## 2023-12-01 NOTE — ED Notes (Signed)
 Patient c/o 8/10 HA states he was given Tylenol but it upset his stomach he is asking for something different, MD Alvino Chapel aware.

## 2023-12-01 NOTE — ED Notes (Signed)
 Patient refused his insulin stating that he has a system at home and since his CBG was less than 115 he was not taking any more insulin. RN educated patient patient still refused, insulin sent back to pharmacy.

## 2023-12-01 NOTE — Progress Notes (Signed)
  PROGRESS NOTE  Patient admitted earlier this morning. See H&P.   Michael Guzman is a 78 y.o. male with medical history significant of CAD, type 2 diabetes, GERD, hypertension, hyperlipidemia who presents emerged part due to nausea vomiting diarrhea.   He was recently admitted to the hospital from 2/27 to 3/1 and was diagnosed with hypovolemic hyponatremia in setting of poor oral intake, nausea and vomiting and diarrhea.  He had low sodium at that time of 119, improved to 129 at time of discharge.  He now presents again with similar symptoms, nausea, vomiting, diarrhea.  Sodium was 116 on presentation.  He was given IV fluid in the emergency department.  Of note, his urine studies from February shows urine sodium 114, urine osmolality 584.  This is more consistent with SIADH.  TSH, cortisol levels were normal at that time.  Patient seen and examined with son at bedside.  Patient is eating breakfast, denies any nausea this morning.  Continue to trend BMP.  On fluid restriction diet , was discussed at length with patient and son.    Status is: Inpatient Remains inpatient appropriate because: Trend BMP    Noralee Stain, DO [facility] 12/01/2023, 11:33 AM  Available via Epic secure chat 7am-7pm After these hours, please refer to coverage provider listed on [email or web address]

## 2023-12-01 NOTE — Plan of Care (Signed)

## 2023-12-02 DIAGNOSIS — E871 Hypo-osmolality and hyponatremia: Secondary | ICD-10-CM | POA: Diagnosis not present

## 2023-12-02 DIAGNOSIS — E222 Syndrome of inappropriate secretion of antidiuretic hormone: Secondary | ICD-10-CM

## 2023-12-02 LAB — BASIC METABOLIC PANEL
Anion gap: 9 (ref 5–15)
Anion gap: 8 (ref 5–15)
Anion gap: 7 (ref 5–15)
Anion gap: 10 (ref 5–15)
BUN: 15 mg/dL (ref 8–23)
BUN: 16 mg/dL (ref 8–23)
BUN: 17 mg/dL (ref 8–23)
BUN: 16 mg/dL (ref 8–23)
CO2: 22 mmol/L (ref 22–32)
CO2: 22 mmol/L (ref 22–32)
CO2: 25 mmol/L (ref 22–32)
CO2: 22 mmol/L (ref 22–32)
Calcium: 9 mg/dL (ref 8.9–10.3)
Calcium: 9 mg/dL (ref 8.9–10.3)
Calcium: 9.4 mg/dL (ref 8.9–10.3)
Calcium: 9.5 mg/dL (ref 8.9–10.3)
Chloride: 90 mmol/L — ABNORMAL LOW (ref 98–111)
Chloride: 89 mmol/L — ABNORMAL LOW (ref 98–111)
Chloride: 89 mmol/L — ABNORMAL LOW (ref 98–111)
Chloride: 90 mmol/L — ABNORMAL LOW (ref 98–111)
Creatinine, Ser: 0.95 mg/dL (ref 0.61–1.24)
Creatinine, Ser: 0.94 mg/dL (ref 0.61–1.24)
Creatinine, Ser: 1.04 mg/dL (ref 0.61–1.24)
Creatinine, Ser: 1.02 mg/dL (ref 0.61–1.24)
GFR, Estimated: >60 mL/min (ref >60)
GFR, Estimated: >60 mL/min (ref >60)
GFR, Estimated: >60 mL/min (ref >60)
GFR, Estimated: >60 mL/min (ref >60)
Glucose, Bld: 121 mg/dL — ABNORMAL HIGH (ref 70–99)
Glucose, Bld: 164 mg/dL — ABNORMAL HIGH (ref 70–99)
Glucose, Bld: 80 mg/dL (ref 70–99)
Glucose, Bld: 110 mg/dL — ABNORMAL HIGH (ref 70–99)
Potassium: 4.3 mmol/L (ref 3.5–5.1)
Potassium: 4.3 mmol/L (ref 3.5–5.1)
Potassium: 4.1 mmol/L (ref 3.5–5.1)
Potassium: 4.1 mmol/L (ref 3.5–5.1)
Sodium: 121 mmol/L — ABNORMAL LOW (ref 135–145)
Sodium: 119 mmol/L — CL (ref 135–145)
Sodium: 121 mmol/L — ABNORMAL LOW (ref 135–145)
Sodium: 122 mmol/L — ABNORMAL LOW (ref 135–145)

## 2023-12-02 LAB — GLUCOSE, CAPILLARY
Glucose-Capillary: 117 mg/dL — ABNORMAL HIGH (ref 70–99)
Glucose-Capillary: 190 mg/dL — ABNORMAL HIGH (ref 70–99)
Glucose-Capillary: 139 mg/dL — ABNORMAL HIGH (ref 70–99)
Glucose-Capillary: 116 mg/dL — ABNORMAL HIGH (ref 70–99)

## 2023-12-02 MED ORDER — SODIUM CHLORIDE 1 G PO TABS
1.0000 g | ORAL_TABLET | Freq: Two times a day (BID) | ORAL | Status: DC
  Administered 2023-12-02 (×2): 1 g via ORAL
  Filled 2023-12-02 (×2): qty 1

## 2023-12-02 MED ORDER — SALINE SPRAY 0.65 % NA SOLN
1.0000 | NASAL | Status: DC | PRN
Start: 2023-12-02 — End: 2023-12-05
  Administered 2023-12-02: 1 via NASAL
  Filled 2023-12-02: qty 44

## 2023-12-02 MED ORDER — BUPROPION HCL ER (XL) 300 MG PO TB24
300.0000 mg | ORAL_TABLET | Freq: Every day | ORAL | Status: DC
  Administered 2023-12-02 – 2023-12-05 (×4): 300 mg via ORAL
  Filled 2023-12-02 (×4): qty 1

## 2023-12-02 NOTE — Progress Notes (Signed)
 PROGRESS NOTE    Michael Guzman  XBM:[id number] DOB: [birth year] DOA: 11/30/2023 PCP: Park Meo, [name]     Brief Narrative:  Michael Guzman is a 78 y.o. male with medical history significant of CAD, type 2 diabetes, GERD, hypertension, hyperlipidemia who presents emerged part due to nausea vomiting diarrhea.   He was recently admitted to the hospital from 2/27 to 3/1 and was diagnosed with hypovolemic hyponatremia in setting of poor oral intake, nausea and vomiting and diarrhea.  He had low sodium at that time of 119, improved to 129 at time of discharge.  He now presents again with similar symptoms, nausea, vomiting, diarrhea.  Sodium was 116 on presentation.  He was given IV fluid in the emergency department.  Of note, his urine studies from February shows urine sodium 114, urine osmolality 584.  This is more consistent with SIADH.  TSH, cortisol levels were normal at that time.  New events last 24 hours / Subjective: Patient complains of a headache today  Assessment & Plan:   Principal Problem:   Hyponatremia Active Problems:   Hypertension (1991)   Hyperlipidemia associated with type 2 diabetes mellitus (HCC)   Aortic atherosclerosis (HCC) by Abd CT scan 2015   Depression, recurrent (HCC)   SIADH (syndrome of inappropriate ADH production) (HCC)   Euvolemic hyponatremia, concern for SIADH -Urine studies from February shows urine sodium 114, urine osmolality 584 -Continue fluid restriction  -Start salt tabs today -Continue to trend BMP every 6 hours  Diarrhea -GI PCR negative. DC enteric precaution   CAD -Aspirin PTA   Hyperlipidemia -Lipitor  Depression -Continue Wellbutrin as this is a long-term medication, concern for withdrawal if stopped abruptly   Diabetes mellitus -Lantus, sliding scale insulin  Hypertension -Toprol  BPH -Flomax    DVT prophylaxis:  enoxaparin (LOVENOX) injection 40 mg Start: 12/01/23 1000 SCDs Start: 11/30/23 2319  Code  Status: Full code Family Communication: No family at bedside Disposition Plan: Home Status is: Inpatient Remains inpatient appropriate because: Hyponatremia    Antimicrobials:  Anti-infectives (From admission, onward)    None        Objective: Vitals:   12/01/23 1943 12/01/23 2342 12/02/23 0356 12/02/23 1229  BP: 120/70 129/72 (!) 144/74 120/88  Pulse: (!) 57 (!) 58 (!) 54 63  Resp: 15 17 15 18   Temp: 98 F (36.7 C) 98 F (36.7 C) 98.3 F (36.8 C) (!) 97.5 F (36.4 C)  TempSrc:    Oral  SpO2: 100% 100% 100% 99%  Weight:      Height:        Intake/Output Summary (Last 24 hours) at 12/02/2023 1304 Last data filed at 12/02/2023 0851 Gross per 24 hour  Intake 220 ml  Output --  Net 220 ml   Filed Weights   12/01/23 1639  Weight: 90.8 kg    Examination:  General exam: Appears calm and comfortable  Respiratory system: Clear to auscultation. Respiratory effort normal. No respiratory distress. No conversational dyspnea.  Cardiovascular system: S1 & S2 heard, RRR. No murmurs. No pedal edema. Gastrointestinal system: Abdomen is nondistended, soft and nontender. Normal bowel sounds heard. Central nervous system: Alert and oriented. No focal neurological deficits. Speech clear.  Extremities: Symmetric in appearance  Skin: No rashes, lesions or ulcers on exposed skin  Psychiatry: Judgement and insight appear normal. Mood & affect appropriate.   Data Reviewed: I have personally reviewed following labs and imaging studies  CBC: Recent Labs  Lab 11/30/23 1916 12/01/23 0456  WBC 6.6 5.7  HGB 12.8* 11.9*  HCT 37.7* 35.8*  MCV 86.1 88.0  PLT 228 202   Basic Metabolic Panel: Recent Labs  Lab 12/01/23 0839 12/01/23 1416 12/01/23 1929 12/02/23 0354 12/02/23 1000  NA 123* 122* 118* 121* 119*  K 3.5 3.7 4.3 4.3 4.3  CL 96* 93* 88* 90* 89*  CO2 18* 20* 22 22 22   GLUCOSE 103* 130* 154* 121* 164*  BUN 10 11 14 15 16   CREATININE 0.69 0.78 0.98 0.95 0.94  CALCIUM  7.5* 7.8* 9.1 9.0 9.0   GFR: Estimated Creatinine Clearance: 75.7 mL/min (by C-G formula based on SCr of 0.94 mg/dL). Liver Function Tests: Recent Labs  Lab 11/30/23 1916  AST 24  ALT 22  ALKPHOS 55  BILITOT 1.0  PROT 8.0  ALBUMIN 4.7   Recent Labs  Lab 11/30/23 1916  LIPASE 23   No results for input(s): "AMMONIA" in the last 168 hours. Coagulation Profile: No results for input(s): "INR", "PROTIME" in the last 168 hours. Cardiac Enzymes: No results for input(s): "CKTOTAL", "CKMB", "CKMBINDEX", "TROPONINI" in the last 168 hours. BNP (last 3 results) No results for input(s): "PROBNP" in the last 8760 hours. HbA1C: No results for input(s): "HGBA1C" in the last 72 hours. CBG: Recent Labs  Lab 12/01/23 0828 12/01/23 1307 12/01/23 1614 12/02/23 0729 12/02/23 1119  GLUCAP 117* 154* 110* 117* 190*   Lipid Profile: No results for input(s): "CHOL", "HDL", "LDLCALC", "TRIG", "CHOLHDL", "LDLDIRECT" in the last 72 hours. Thyroid Function Tests: No results for input(s): "TSH", "T4TOTAL", "FREET4", "T3FREE", "THYROIDAB" in the last 72 hours. Anemia Panel: No results for input(s): "VITAMINB12", "FOLATE", "FERRITIN", "TIBC", "IRON", "RETICCTPCT" in the last 72 hours. Sepsis Labs: No results for input(s): "PROCALCITON", "LATICACIDVEN" in the last 168 hours.  Recent Results (from the past 240 hours)  Resp panel by RT-PCR (RSV, Flu A&B, Covid) Anterior Nasal Swab     Status: None   Collection Time: 11/30/23  7:19 PM   Specimen: Anterior Nasal Swab  Result Value Ref Range Status   SARS Coronavirus 2 by RT PCR NEGATIVE NEGATIVE Final    Comment: (NOTE) SARS-CoV-2 target nucleic acids are NOT DETECTED.  The SARS-CoV-2 RNA is generally detectable in upper respiratory specimens during the acute phase of infection. The lowest concentration of SARS-CoV-2 viral copies this assay can detect is 138 copies/mL. A negative result does not preclude SARS-Cov-2 infection and should not be  used as the sole basis for treatment or other patient management decisions. A negative result may occur with  improper specimen collection/handling, submission of specimen other than nasopharyngeal swab, presence of viral mutation(s) within the areas targeted by this assay, and inadequate number of viral copies(<138 copies/mL). A negative result must be combined with clinical observations, patient history, and epidemiological information. The expected result is Negative.  Fact Sheet for Patients:  BloggerCourse.com  Fact Sheet for Healthcare Providers:  SeriousBroker.it  This test is no t yet approved or cleared by the [place] FDA and  has been authorized for detection and/or diagnosis of SARS-CoV-2 by FDA under an Emergency Use Authorization (EUA). This EUA will remain  in effect (meaning this test can be used) for the duration of the COVID-19 declaration under Section 564(b)(1) of the Act, 21 U.S.C.section 360bbb-3(b)(1), unless the authorization is terminated  or revoked sooner.       Influenza A by PCR NEGATIVE NEGATIVE Final   Influenza B by PCR NEGATIVE NEGATIVE Final    Comment: (NOTE) The Xpert Xpress SARS-CoV-2/FLU/RSV  plus assay is intended as an aid in the diagnosis of influenza from Nasopharyngeal swab specimens and should not be used as a sole basis for treatment. Nasal washings and aspirates are unacceptable for Xpert Xpress SARS-CoV-2/FLU/RSV testing.  Fact Sheet for Patients: BloggerCourse.com  Fact Sheet for Healthcare Providers: SeriousBroker.it  This test is not yet approved or cleared by the [place] FDA and has been authorized for detection and/or diagnosis of SARS-CoV-2 by FDA under an Emergency Use Authorization (EUA). This EUA will remain in effect (meaning this test can be used) for the duration of the COVID-19 declaration under Section  564(b)(1) of the Act, 21 U.S.C. section 360bbb-3(b)(1), unless the authorization is terminated or revoked.     Resp Syncytial Virus by PCR NEGATIVE NEGATIVE Final    Comment: (NOTE) Fact Sheet for Patients: BloggerCourse.com  Fact Sheet for Healthcare Providers: SeriousBroker.it  This test is not yet approved or cleared by the [place] FDA and has been authorized for detection and/or diagnosis of SARS-CoV-2 by FDA under an Emergency Use Authorization (EUA). This EUA will remain in effect (meaning this test can be used) for the duration of the COVID-19 declaration under Section 564(b)(1) of the Act, 21 U.S.C. section 360bbb-3(b)(1), unless the authorization is terminated or revoked.  Performed at [facility], [address], [place], [place] [address]   Gastrointestinal Panel by PCR , Stool     Status: None   Collection Time: 12/01/23  4:06 AM   Specimen: Stool  Result Value Ref Range Status   Campylobacter species NOT DETECTED NOT DETECTED Final   Plesimonas shigelloides NOT DETECTED NOT DETECTED Final   Salmonella species NOT DETECTED NOT DETECTED Final   Yersinia enterocolitica NOT DETECTED NOT DETECTED Final   Vibrio species NOT DETECTED NOT DETECTED Final   Vibrio cholerae NOT DETECTED NOT DETECTED Final   Enteroaggregative E coli (EAEC) NOT DETECTED NOT DETECTED Final   Enteropathogenic E coli (EPEC) NOT DETECTED NOT DETECTED Final   Enterotoxigenic E coli (ETEC) NOT DETECTED NOT DETECTED Final   Shiga like toxin producing E coli (STEC) NOT DETECTED NOT DETECTED Final   Shigella/Enteroinvasive E coli (EIEC) NOT DETECTED NOT DETECTED Final   Cryptosporidium NOT DETECTED NOT DETECTED Final   Cyclospora cayetanensis NOT DETECTED NOT DETECTED Final   Entamoeba histolytica NOT DETECTED NOT DETECTED Final   Giardia lamblia NOT DETECTED NOT DETECTED Final   Adenovirus F40/41 NOT DETECTED NOT DETECTED  Final   Astrovirus NOT DETECTED NOT DETECTED Final   Norovirus GI/GII NOT DETECTED NOT DETECTED Final   Rotavirus A NOT DETECTED NOT DETECTED Final   Sapovirus (I, II, IV, and V) NOT DETECTED NOT DETECTED Final    Comment: Performed at [facility], [address] [place], [place] [address]      Radiology Studies: CT ABDOMEN PELVIS W CONTRAST Result Date: 11/30/2023 CLINICAL DATA:  Abdominal pain EXAM: CT ABDOMEN AND PELVIS WITH CONTRAST TECHNIQUE: Multidetector CT imaging of the abdomen and pelvis was performed using the standard protocol following bolus administration of intravenous contrast. RADIATION DOSE REDUCTION: This exam was performed according to the departmental dose-optimization program which includes automated exposure control, adjustment of the mA and/or kV according to patient size and/or use of iterative reconstruction technique. CONTRAST:  OMNIPAQUE IOHEXOL 300 MG/ML  SOLN COMPARISON:  01/16/2016 FINDINGS: Lower chest: Trace bilateral pleural effusions. Dependent atelectasis in the lower lobes. Coronary artery calcifications. Hepatobiliary: Diffuse low-density throughout the liver compatible with fatty infiltration. No focal abnormality. Gallbladder unremarkable.  Pancreas: No focal abnormality or ductal dilatation. Spleen: No focal abnormality.  Normal size. Adrenals/Urinary Tract: Bilateral simple appearing renal cysts. No follow-up imaging recommended. No stones or hydronephrosis. Urinary bladder and adrenal glands unremarkable. Stomach/Bowel: Scattered colonic diverticula. No active diverticulitis. Stomach and small bowel decompressed. No bowel obstruction or inflammatory process. Vascular/Lymphatic: No evidence of aneurysm or adenopathy. Reproductive: No visible focal abnormality. Other: No free fluid or free air. Musculoskeletal: Prior right hip replacement. No acute bony abnormality. IMPRESSION: Trace bilateral pleural effusions. Dependent atelectasis in the lower  lobes. Coronary artery disease. Hepatic steatosis. Scattered colonic diverticulosis. No acute findings in the abdomen or pelvis. Electronically Signed   By: Charlett Nose M.D.   On: 11/30/2023 22:55      Scheduled Meds:  atorvastatin  10 mg Oral Daily   buPROPion  300 mg Oral Daily   enoxaparin (LOVENOX) injection  40 mg Subcutaneous Q24H   feeding supplement (NEPRO CARB STEADY)  237 mL Oral BID BM   glycopyrrolate  2 mg Oral TID   insulin aspart  0-24 Units Subcutaneous TID WC   insulin glargine  20 Units Subcutaneous Daily   metoprolol succinate  12.5 mg Oral Daily   sodium chloride  1 g Oral BID WC   tamsulosin  0.4 mg Oral QHS   Continuous Infusions:   LOS: 2 days   Time spent: 25 minutes   Noralee Stain, DO [facility] 12/02/2023, 1:04 PM   Available via Epic secure chat 7am-7pm After these hours, please refer to coverage provider listed on [email or web address]

## 2023-12-02 NOTE — Plan of Care (Signed)
  Problem: Education: Goal: Knowledge of General Education information will improve Description: Including pain rating scale, medication(s)/side effects and non-pharmacologic comfort measures Outcome: Progressing   Problem: Health Behavior/Discharge Planning: Goal: Ability to manage health-related needs will improve Outcome: Progressing   Problem: Clinical Measurements: Goal: Ability to maintain clinical measurements within normal limits will improve Outcome: Progressing Goal: Diagnostic test results will improve Outcome: Progressing   Problem: Pain Managment: Goal: General experience of comfort will improve and/or be controlled Outcome: Progressing   Problem: Safety: Goal: Ability to remain free from injury will improve Outcome: Progressing   Problem: Clinical Measurements: Goal: Will remain free from infection Outcome: Adequate for Discharge Goal: Respiratory complications will improve Outcome: Adequate for Discharge Goal: Cardiovascular complication will be avoided Outcome: Adequate for Discharge   Problem: Activity: Goal: Risk for activity intolerance will decrease Outcome: Adequate for Discharge   Problem: Nutrition: Goal: Adequate nutrition will be maintained Outcome: Adequate for Discharge   Problem: Coping: Goal: Level of anxiety will decrease Outcome: Adequate for Discharge   Problem: Elimination: Goal: Will not experience complications related to bowel motility Outcome: Adequate for Discharge Goal: Will not experience complications related to urinary retention Outcome: Adequate for Discharge   Problem: Skin Integrity: Goal: Risk for impaired skin integrity will decrease Outcome: Adequate for Discharge

## 2023-12-02 NOTE — TOC Initial Note (Signed)
 Transition of Care [facility]) - Initial/Assessment Note    Patient Details  Name: Michael Guzman MRN: [id number] Date of Birth: 04/28/1946  [facility]) CM/SW Contact:    Lanier Clam, RN Phone Number: 12/02/2023, 11:36 AM  Clinical Narrative: d/c plan home.                  Expected Discharge Plan: Home/Self Care Barriers to Discharge: Continued Medical Work up   Patient Goals and CMS Choice Patient states their goals for this hospitalization and ongoing recovery are:: Home CMS Medicare.gov Compare Post Acute Care list provided to:: Patient Choice offered to / list presented to : Patient [place] ownership interest in [facility].provided to:: Patient    Expected Discharge Plan and Services       Living arrangements for the past 2 months: Single Family Home                                      Prior Living Arrangements/Services Living arrangements for the past 2 months: Single Family Home Lives with:: Adult Children                   Activities of Daily Living   ADL Screening (condition at time of admission) Independently performs ADLs?: Yes (appropriate for developmental age) Is the patient deaf or have difficulty hearing?: No Does the patient have difficulty seeing, even when wearing glasses/contacts?: No Does the patient have difficulty concentrating, remembering, or making decisions?: No  Permission Sought/Granted                  Emotional Assessment              Admission diagnosis:  Hyponatremia [E87.1] Patient Active Problem List   Diagnosis Date Noted   Depression, recurrent (HCC) 11/22/2023   Hyponatremia 11/11/2023   Hypovolemia 11/11/2023   Nausea vomiting and diarrhea 11/11/2023   Adenomatous polyp of transverse colon 06/18/2022   Hx of adenomatous colonic polyps 06/18/2022   Post colonoscopy pain nausea and vomiting 06/18/2022   Abnormal colonoscopy 06/18/2022   Diaphoresis 08/04/2021    Inadequate sleep hygiene 08/04/2021   Hypersomnia with sleep apnea 08/04/2021   Excessive daytime sleepiness 08/04/2021   Excessive postexertional fatigue 08/04/2021   Diabetes mellitus with coincident hypertension (HCC) 06/17/2021   AMS (altered mental status) 01/11/2021   Hypoglycemia due to insulin 01/11/2021   Acute encephalopathy    Hypothermia    Primary osteoarthritis of hip 04/30/2019   Former smoker 02/02/2019   Fatty infiltration of liver 11/30/2018   Hypertriglyceridemia    Microalbuminuric diabetic nephropathy (HCC) 06/01/2018   Aortic atherosclerosis (HCC) by Abd CT scan 2015 05/30/2018   Type 2 diabetes mellitus with vascular disease (HCC) 01/31/2018   Obesity (BMI 30.0-34.9) 11/03/2017   Thyroiditis 05/12/2016   Malignant neoplasm of prostate (HCC) 05/05/2016   Diabetic peripheral neuropathy (HCC) 03/06/2015   Medication management 07/24/2014   Vitamin D deficiency 03/06/2014   Hyperlipidemia associated with type 2 diabetes mellitus (HCC) 06/28/2012   ASCAD s/p PTCA (06/2012) 06/27/2012   Hypertension (1991) 06/25/2012   GERD 06/25/2012   PCP:  Park Meo, [name] Pharmacy:   [id number] - [address]), [place] - [address] [place]) [place] [address] Phone: [phone] Fax: [phone]  [facility]) - [place], [place] - [address] [place] [phone]  [employer] [address] [place] [address] Phone: [phone] Fax: [phone]  [facility] - [place], [place] - [address] [place] [address] Phone: [phone] Fax: [phone]  [facility] [address] [place] - [address] [place] [address] Phone: [phone] Fax: [phone]     Social Drivers of Health (SDOH) Social History: SDOH Screenings   Food Insecurity: No Food Insecurity (12/01/2023)  Housing: Low Risk   (12/01/2023)  Transportation Needs: No Transportation Needs (12/01/2023)  Utilities: Not At Risk (12/01/2023)  Depression (PHQ2-9): Low Risk  (11/22/2023)  Social Connections: Patient Declined (12/01/2023)  Tobacco Use: Medium Risk (11/30/2023)   SDOH Interventions:     Readmission Risk Interventions     No data to display

## 2023-12-03 DIAGNOSIS — E871 Hypo-osmolality and hyponatremia: Secondary | ICD-10-CM | POA: Diagnosis not present

## 2023-12-03 LAB — BASIC METABOLIC PANEL
Anion gap: 8 (ref 5–15)
Anion gap: 9 (ref 5–15)
BUN: 16 mg/dL (ref 8–23)
BUN: 14 mg/dL (ref 8–23)
CO2: 22 mmol/L (ref 22–32)
CO2: 20 mmol/L — ABNORMAL LOW (ref 22–32)
Calcium: 9.2 mg/dL (ref 8.9–10.3)
Calcium: 8.8 mg/dL — ABNORMAL LOW (ref 8.9–10.3)
Chloride: 92 mmol/L — ABNORMAL LOW (ref 98–111)
Chloride: 95 mmol/L — ABNORMAL LOW (ref 98–111)
Creatinine, Ser: 1.03 mg/dL (ref 0.61–1.24)
Creatinine, Ser: 0.92 mg/dL (ref 0.61–1.24)
GFR, Estimated: >60 mL/min (ref >60)
GFR, Estimated: >60 mL/min (ref >60)
Glucose, Bld: 121 mg/dL — ABNORMAL HIGH (ref 70–99)
Glucose, Bld: 135 mg/dL — ABNORMAL HIGH (ref 70–99)
Potassium: 3.9 mmol/L (ref 3.5–5.1)
Potassium: 3.9 mmol/L (ref 3.5–5.1)
Sodium: 122 mmol/L — ABNORMAL LOW (ref 135–145)
Sodium: 124 mmol/L — ABNORMAL LOW (ref 135–145)

## 2023-12-03 LAB — SODIUM: Sodium: 124 mmol/L — ABNORMAL LOW (ref 135–145)

## 2023-12-03 LAB — SODIUM, URINE, RANDOM: Sodium, Ur: 46 mmol/L

## 2023-12-03 LAB — GLUCOSE, CAPILLARY
Glucose-Capillary: 138 mg/dL — ABNORMAL HIGH (ref 70–99)
Glucose-Capillary: 98 mg/dL (ref 70–99)
Glucose-Capillary: 115 mg/dL — ABNORMAL HIGH (ref 70–99)
Glucose-Capillary: 120 mg/dL — ABNORMAL HIGH (ref 70–99)

## 2023-12-03 MED ORDER — SODIUM CHLORIDE 0.9 % IV BOLUS
1500.0000 mL | Freq: Once | INTRAVENOUS | Status: AC
  Administered 2023-12-03: 1500 mL via INTRAVENOUS

## 2023-12-03 MED ORDER — SODIUM CHLORIDE 0.9 % IV SOLN: INTRAVENOUS | Status: DC

## 2023-12-03 MED ORDER — SODIUM CHLORIDE 1 G PO TABS: 1.0000 g | ORAL_TABLET | Freq: Three times a day (TID) | ORAL | Status: DC

## 2023-12-03 MED ORDER — SODIUM CHLORIDE 0.9 % IV BOLUS
1000.0000 mL | Freq: Once | INTRAVENOUS | Status: AC
Start: 2023-12-03 — End: 2023-12-03
  Administered 2023-12-03: 1000 mL via INTRAVENOUS

## 2023-12-03 NOTE — Evaluation (Signed)
 Physical Therapy Evaluation Patient Details Name: Michael Guzman MRN: [id number] DOB: 1946/06/09 Today's Date: 12/03/2023  History of Present Illness  Pt is a 78 y/o M admitted on 11/30/23 after presenting with c/o N&V & diarrhea. Pt recently admitted 2/27-3/1 due to hypovolemic hyponatremia in the setting of poor oral intake, N&V, & diarrhea. Pt is now being treated for euvolemic hyponatremia, concern for SIADH. PMH: CAD, DM2, GERD, HTN, HLD  Clinical Impression  Pt seen for PT evaluation with pt agreeable to tx. Pt reports prior to admission he was independent without AD, denies falls. On this date, pt is able to ambulate into hallway without AD with supervision, decreased gait speed but pt reports he is ambulating faster than his baseline. At this time, pt does not require acute PT services. PT to complete current orders, please re-consult if new needs arise.        If plan is discharge home, recommend the following: Help with stairs or ramp for entrance;Assist for transportation   Can travel by private vehicle        Equipment Recommendations None recommended by PT  Recommendations for Other Services       Functional Status Assessment Patient has not had a recent decline in their functional status     Precautions / Restrictions Precautions Precautions: None Restrictions Weight Bearing Restrictions Per Provider Order: No      Mobility  Bed Mobility               General bed mobility comments: not tested, pt received & left sitting EOB    Transfers Overall transfer level: Independent Equipment used: None               General transfer comment: STS from EOB without AD    Ambulation/Gait Ambulation/Gait assistance: Supervision Gait Distance (Feet):  (>200 ft)   Gait Pattern/deviations: Decreased step length - right, Decreased step length - left, Decreased stride length Gait velocity: decreased but pt reports he is ambulating faster than he does at  baseline     General Gait Details: slightly decreased foot clearance RLE, no overt LOB  Stairs            Wheelchair Mobility     Tilt Bed    Modified Rankin (Stroke Patients Only)       Balance Overall balance assessment: Mild deficits observed, not formally tested   Sitting balance-Leahy Scale: Good     Standing balance support: No upper extremity supported, During functional activity Standing balance-Leahy Scale: Good                               Pertinent Vitals/Pain Pain Assessment Pain Assessment: No/denies pain    Home Living Family/patient expects to be discharged to:: Private residence Living Arrangements: Children Available Help at Discharge: Family;Available 24 hours/day Type of Home: House Home Access: Stairs to enter Entrance Stairs-Rails: Can reach both;Left;Right Entrance Stairs-Number of Steps: 3   Home Layout: One level Home Equipment: Cane - single point;Hand held Stage manager (4 wheels)      Prior Function Prior Level of Function : Independent/Modified Independent             Mobility Comments: Independent without AD, denies falls in the past 6 months, enjoys mowing the lawn, gardening, has a new puppy, walks to the mailbox. ADLs Comments: loves to garden and has a new puppy     Extremity/Trunk Assessment   Upper Extremity  Assessment Upper Extremity Assessment: Overall WFL for tasks assessed    Lower Extremity Assessment Lower Extremity Assessment: Overall WFL for tasks assessed    Cervical / Trunk Assessment Cervical / Trunk Assessment: Normal  Communication   Communication Communication: No apparent difficulties    Cognition Arousal: Alert Behavior During Therapy: WFL for tasks assessed/performed   PT - Cognitive impairments: No apparent impairments                         Following commands: Intact       Cueing Cueing Techniques: Verbal cues     General Comments General  comments (skin integrity, edema, etc.): HR 67-72 bpm    Exercises Other Exercises Other Exercises: Pt performed 5x STS from EOB without BUE support without LOB.   Assessment/Plan    PT Assessment Patient does not need any further PT services  PT Problem List         PT Treatment Interventions      PT Goals (Current goals can be found in the Care Plan section)  Acute Rehab PT Goals Patient Stated Goal: get better, go home PT Goal Formulation: With patient Time For Goal Achievement: 12/18/23 Potential to Achieve Goals: Good    Frequency       Co-evaluation               AM-PAC PT "6 Clicks" Mobility  Outcome Measure Help needed turning from your back to your side while in a flat bed without using bedrails?: None Help needed moving from lying on your back to sitting on the side of a flat bed without using bedrails?: None Help needed moving to and from a bed to a chair (including a wheelchair)?: None Help needed standing up from a chair using your arms (e.g., wheelchair or bedside chair)?: None Help needed to walk in hospital room?: A Little Help needed climbing 3-5 steps with a railing? : A Little 6 Click Score: 22    End of Session   Activity Tolerance: Patient tolerated treatment well Patient left: in bed;with call bell/phone within reach Nurse Communication: Mobility status      Time: 4132-4401 PT Time Calculation (min) (ACUTE ONLY): 9 min   Charges:   PT Evaluation $PT Eval Low Complexity: 1 Low   PT General Charges $$ ACUTE PT VISIT: 1 Visit         Aleda Grana, PT, DPT 12/03/23, 11:39 AM   Sandi Mariscal 12/03/2023, 11:36 AM

## 2023-12-03 NOTE — Consult Note (Signed)
 Renal Service Consult Note [facility]  Michael Guzman 12/03/2023 Maree Krabbe, MD Requesting Physician:   Reason for Consult: Hyponatremia HPI: The patient is a 78 y.o. year-old w/ PMH as below who presented to ED c/o N/V and diarrhea. Na+ was 116 in the ED. VS were stable, wbc 6K, Hb 12.8.  CT abd was negative. He was given 1L NS in ED.  Na+ improved to 122 the next morning, then dropped back to 118. Salt tabs and fluid restriction were started and Na+ improved to 122 but then stalled. We are asked to see for hyponatremia.    Pt seen in room. Vague historian, denies hx of heart, liver or kidney failure, no adrenal or thyroid disease. No leg swelling or SOB.  No confusion.    PMH CAD DM2/ DKA Fatty liver Hx radiation therpay HTN HL Prostate cancer    ROS - denies CP, no joint pain, no HA, no blurry vision, no rash   Past Medical History  Past Medical History:  Diagnosis Date   Arthritis    CAD (coronary artery disease)    a.s/p DES to mid LAD and OM2 06/2012.   Chronic back pain    Chronically dry eyes    Diabetes mellitus without complication (HCC)    TYPE 2    DKA (diabetic ketoacidoses) 01/30/2017   Elevated PSA    being monitored   Fatty liver    GERD (gastroesophageal reflux disease)    Hx of radiation therapy    prostate , [facility]    Hypertension    Hypertriglyceridemia    Intermediate coronary syndrome (HCC) 07/26/2012   Nausea vomiting and diarrhea 11/11/2023   Neuromuscular disorder (HCC)    neuropathy feet and legs   Prostate cancer (HCC)    RADTIATION    Past Surgical History  Past Surgical History:  Procedure Laterality Date   APPENDECTOMY     CARDIAC SURGERY  ~2017   3 stent placed.   COLONOSCOPY  02/12/2010   Buccini   COLONOSCOPY WITH PROPOFOL N/A 09/21/2022   Procedure: COLONOSCOPY WITH PROPOFOL;  Surgeon: Meridee Score Netty Starring., MD;  Location: [facility];  Service: Gastroenterology;  Laterality:  N/A;   ENDOSCOPIC MUCOSAL RESECTION N/A 09/21/2022   Procedure: ENDOSCOPIC MUCOSAL RESECTION;  Surgeon: Meridee Score Netty Starring., MD;  Location: [facility];  Service: Gastroenterology;  Laterality: N/A;   HEMOSTASIS CLIP PLACEMENT  09/21/2022   Procedure: HEMOSTASIS CLIP PLACEMENT;  Surgeon: Lemar Lofty., MD;  Location: [facility];  Service: Gastroenterology;;   LEFT HEART CATHETERIZATION WITH CORONARY ANGIOGRAM N/A 06/27/2012   Procedure: LEFT HEART CATHETERIZATION WITH CORONARY ANGIOGRAM;  Surgeon: Donato Schultz, MD;  Location: [facility] CATH LAB;  Service: Cardiovascular;  Laterality: N/A;   LEFT HEART CATHETERIZATION WITH CORONARY ANGIOGRAM N/A 07/25/2012   Procedure: LEFT HEART CATHETERIZATION WITH CORONARY ANGIOGRAM;  Surgeon: Quintella Reichert, MD;  Location: [facility] CATH LAB;  Service: Cardiovascular;  Laterality: N/A;   PROSTATE BIOPSY     RADIOLOGY WITH ANESTHESIA N/A 12/03/2015   Procedure: MRI LUMBAR SPINE;  Surgeon: Medication Radiologist, MD;  Location: [facility] OR;  Service: Radiology;  Laterality: N/A;   SUBMUCOSAL LIFTING INJECTION  09/21/2022   Procedure: SUBMUCOSAL LIFTING INJECTION;  Surgeon: Meridee Score Netty Starring., MD;  Location: [facility];  Service: Gastroenterology;;   TOTAL HIP ARTHROPLASTY Right 04/28/2019   Procedure: TOTAL HIP ARTHROPLASTY ANTERIOR APPROACH;  Surgeon: Jodi Geralds, MD;  Location: [facility];  Service: Orthopedics;  Laterality: Right;   Family History  Family  History  Problem Relation Age of Onset   Diabetes Sister    Stomach cancer Neg Hx    Colon cancer Neg Hx    Esophageal cancer Neg Hx    Inflammatory bowel disease Neg Hx    Liver disease Neg Hx    Pancreatic cancer Neg Hx    Rectal cancer Neg Hx    Social History  reports that he quit smoking about 12 years ago. His smoking use included cigarettes. He started smoking about 65 years ago. He has a 39.6 pack-year smoking history. He has never used smokeless tobacco. He reports that he does not currently use  alcohol. He reports that he does not use drugs. Allergies No Known Allergies Home medications Prior to Admission medications   Medication Sig Start Date End Date Taking? Authorizing Provider  Ascorbic Acid (VITAMIN C) 1000 MG tablet Take 1,000 mg by mouth daily.   Yes [provider]  aspirin EC 81 MG tablet Take 1 tablet (81 mg total) by mouth daily. 10/27/19  Yes Jake Bathe, MD  atorvastatin (LIPITOR) 10 MG tablet TAKE 1 TABLET BY MOUTH DAILY 06/23/23  Yes Raynelle Dick, NP  B Complex-C (SUPER B COMPLEX PO) Take 1 tablet by mouth daily.   Yes [provider]  buPROPion (WELLBUTRIN XL) 300 MG 24 hr tablet TAKE 1 TABLET BY MOUTH DAILY FOR MOOD, FOCUS AND CONCENTRATION 06/16/23  Yes Cranford, Tonya, NP  Cholecalciferol (VITAMIN D3) 250 MCG (10000 UT) capsule Take 10,000 Units by mouth daily.   Yes [provider]  gabapentin (NEURONTIN) 800 MG tablet TAKE 1 TABLET BY MOUTH 3 TO 4  TIMES DAILY FOR PAIN, HOT  FLASHES AND SWEATS Patient taking differently: Take 800 mg by mouth 2 (two) times daily. FOR PAIN, HOT FLASHES AND SWEATS 04/05/23  Yes Raynelle Dick, NP  glycopyrrolate (ROBINUL) 2 MG tablet Take  1 tablet  3 x / day for Excessive Sweating  TAKE ONE TABLET BY                                                 MOUTH 12/24/22  Yes Lucky Cowboy, MD  HUMULIN 70/30 (70-30) 100 UNIT/ML injection INJECT SUBCUTANEOUSLY 50 UNITS  TWICE DAILY Patient taking differently: Inject 30 Units into the skin daily. AILY 06/23/22  Yes Cranford, Archie Patten, NP  Magnesium 500 MG TABS Take 500 mg by mouth daily.   Yes [provider]  meloxicam (MOBIC) 15 MG tablet Take 15 mg by mouth daily. 10/27/23  Yes [provider]  metFORMIN (GLUCOPHAGE-XR) 500 MG 24 hr tablet TAKE 2 TABLETS BY MOUTH TWICE  DAILY WITH MEALS FOR DIABETES 06/23/23  Yes Raynelle Dick, NP  montelukast (SINGULAIR) 10 MG tablet TAKE 1 TABLET BY MOUTH DAILY FOR ALLERGIES 04/05/23  Yes Raynelle Dick, NP  pantoprazole (PROTONIX) 40 MG tablet TAKE 1 TABLET BY MOUTH DAILY TO  PREVENT HEARTBURN AND  INDIGESTION 04/05/23  Yes Raynelle Dick, NP  tamsulosin (FLOMAX) 0.4 MG CAPS capsule Take 0.4 mg by mouth at bedtime.   Yes [provider]  Vitamin E 450 MG (1000 UT) CAPS Take 450 mg by mouth daily.   Yes [provider]  zinc gluconate 50 MG tablet Take 50 mg by mouth daily.   Yes [provider]  Blood Glucose Monitoring Suppl (ONE TOUCH  ULTRA 2) w/Device KIT Use as directed 03/12/22   Raynelle Dick, NP  glucose blood (ONETOUCH ULTRA) test strip CHECK BLOOD SUGAR 3 TIMES  DAILY 11/27/21   Raynelle Dick, NP  Insulin Syringes, Disposable, U-100 1 ML MISC 50 Units by Does not apply route 2 (two) times daily. inject 50 units into skin 2 x /day 07/23/20   Lucky Cowboy, MD  Lancets [facility] DELICA PLUS [place]) MISC CHECK BLOOD SUGAR 3 TIMES  DAILY 07/09/22   Lucky Cowboy, MD  metoprolol succinate (TOPROL-XL) 25 MG 24 hr tablet TAKE ONE-HALF TABLET BY MOUTH  DAILY 10/19/23   Jake Bathe, MD  nitroGLYCERIN (NITROSTAT) 0.4 MG SL tablet PLACE 1 TABLET UNDER THE TONGUE EVERY 5 MINUTES AS NEEDED FOR CHEST PAIN 08/31/22   Raynelle Dick, NP     Vitals:   12/02/23 2011 12/03/23 8295 12/03/23 0821 12/03/23 1224  BP: 105/75 106/68 109/65 124/74  Pulse: (!) 58 (!) 56 (!) 59 (!) 57  Resp: 18 18 20 16   Temp: 97.8 F (36.6 C) 98.7 F (37.1 C) 98.3 F (36.8 C) 98.1 F (36.7 C)  TempSrc: Oral Oral Oral Oral  SpO2: 100% 99% 100% 100%  Weight:      Height:       Exam Gen alert, no distress No rash, cyanosis or gangrene Sclera anicteric, throat clear  No jvd or bruits Chest clear bilat to bases, no rales/ wheezing RRR no MRG Abd soft ntnd no mass or ascites +bs GU nl male MS no joint effusions or deformity Ext no LE or UE edema, no other edema Neuro is alert, Ox 3 , nf       Renal-related home meds: Toprol xl 12.5 every day Mobic 15 mg every  day Others: asa, statin, wellbutrin xl, gabapentin, insulin, metformin, singulair, flomax, sl ntg     Assessment/ Plan: Hyponatremia - in pt w/ hx of N/V and diarrhea. Pt had similar recent admit which was rx'd as SIADH. Na+ here was 116 originally. Pt rec'd 1 L NS the 1st night in the area and Na+ improved to 122 overnight, then dropped to 118. Salt tabs and fluid restriction was started and Na+ today was again 122. I recommended 1 L NS and repeat Na+ post bolus is up to 124.  Pt is euvolemic to dry on exam. Trial of NS 0.9% 1 L moved the Na+ up 2 points. No hx heart, liver or renal failure, no adrenal / thyroid issues tested last admit in Feb. Will rebolus him 1.5 L saline and cont NS 0.9% at 65 cc/hr overnight. F/u sodium levels every 6 hrs. Will follow.  N/V, diarrhea - per pmd H/o CAD DM2 HTN - on BB      Rob Tadeusz Stahl  MD [facility] 12/03/2023, 2:43 PM  Recent Labs  Lab 11/30/23 1916 12/01/23 0212 12/01/23 0456 12/01/23 0839 12/03/23 0452 12/03/23 1309  HGB 12.8*  --  11.9*  --   --   --   ALBUMIN 4.7  --   --   --   --   --   CALCIUM 9.3   < >  --    < > 9.2 8.8*  CREATININE 0.67   < >  --    < > 1.03 0.92  K 4.3   < >  --    < > 3.9 3.9   < > = values in this interval not displayed.   Inpatient medications:  atorvastatin  10 mg Oral Daily  buPROPion  300 mg Oral Daily   enoxaparin (LOVENOX) injection  40 mg Subcutaneous Q24H   feeding supplement (NEPRO CARB STEADY)  237 mL Oral BID BM   glycopyrrolate  2 mg Oral TID   insulin aspart  0-24 Units Subcutaneous TID WC   insulin glargine  20 Units Subcutaneous Daily   metoprolol succinate  12.5 mg Oral Daily   tamsulosin  0.4 mg Oral QHS    acetaminophen **OR** acetaminophen, ketorolac, ondansetron **OR** ondansetron (ZOFRAN) IV, sodium chloride

## 2023-12-03 NOTE — Evaluation (Signed)
 Occupational Therapy Evaluation Patient Details Name: Michael Guzman MRN: [id number] DOB: 07-Jun-1946 Today's Date: 12/03/2023   History of Present Illness   Michael Guzman is a 78 y.o. male with reports of n/v and diarrhea and admitted for hyponatremia PMH: CAD, type 2 diabetes, GERD, HTN, hyperlipidemia, SIADH, depression, chronic pain     Clinical Impressions PTA, patient was living at home with grown son and family. Was here at [facility] late Feb and discharged to home without any OT needs. Patient currently appears at baseline for BADL's and functional mobility needs in room this session and reports he has been ambulating without AD to and from bathroom since admission. Patient had no pain, was pleasant and cooperative and open to all therapy presented. Demonstrated sufficient balance and functional reach this session for toileting and BADL's. Patient reports his family will be available 24/7 upon discharge. No further OT Acute or follow up needs identified with OT signing off for services this session.      If plan is discharge home, recommend the following:   Assist for transportation;Help with stairs or ramp for entrance     Functional Status Assessment   Patient has not had a recent decline in their functional status     Equipment Recommendations   None recommended by OT      Precautions/Restrictions   Precautions Precautions: Fall Precaution/Restrictions Comments: pt denies falls in past 6 months Restrictions Weight Bearing Restrictions Per Provider Order: No     Mobility Bed Mobility Overal bed mobility: Independent                  Transfers Overall transfer level: Independent Equipment used: None                      Balance Overall balance assessment: Independent (no LOB for any BADl's and mobility this session)                                         ADL either performed or assessed with clinical judgement   ADL  Overall ADL's : Modified independent                                       General ADL Comments: OT assessed patient amb in room, sit to stand from various surfaces, perform functiona reach for seated LB self care and standing level simple tasks with no issues     Vision Baseline Vision/History: 1 Wears glasses Ability to See in Adequate Light: 0 Adequate Patient Visual Report: No change from baseline Vision Assessment?: No apparent visual deficits     Perception Perception: Within Functional Limits       Praxis Praxis: WFL       Pertinent Vitals/Pain Pain Assessment Pain Assessment: No/denies pain     Extremity/Trunk Assessment Upper Extremity Assessment Upper Extremity Assessment: Overall WFL for tasks assessed   Lower Extremity Assessment Lower Extremity Assessment: Overall WFL for tasks assessed   Cervical / Trunk Assessment Cervical / Trunk Assessment: Normal   Communication Communication Communication: No apparent difficulties   Cognition Arousal: Alert Behavior During Therapy: WFL for tasks assessed/performed Cognition: No apparent impairments  Following commands: Intact       Cueing  General Comments      no skin issues           Home Living Family/patient expects to be discharged to:: Private residence Living Arrangements: Children Available Help at Discharge: Family;Available 24 hours/day Type of Home: House Home Access: Stairs to enter [employer] of Steps: 3 Entrance Stairs-Rails: Can reach both;Left;Right Home Layout: One level     Bathroom Shower/Tub: Walk-in Pensions consultant: Handicapped height Bathroom Accessibility: Yes How Accessible: Accessible via walker Home Equipment: Cane - single point;Hand held shower head          Prior Functioning/Environment Prior Level of Function : Independent/Modified Independent             Mobility  Comments: no falls reported ADLs Comments: loves to garden and has a new puppy     AM-PAC OT "6 Clicks" Daily Activity     Outcome Measure Help from another person eating meals?: None Help from another person taking care of personal grooming?: None Help from another person toileting, which includes using toliet, bedpan, or urinal?: None Help from another person bathing (including washing, rinsing, drying)?: None Help from another person to put on and taking off regular upper body clothing?: None Help from another person to put on and taking off regular lower body clothing?: None 6 Click Score: 24   End of Session Equipment Utilized During Treatment: Gait belt Nurse Communication: Mobility status  Activity Tolerance: Patient tolerated treatment well Patient left: in bed;with call bell/phone within reach;with bed alarm set                   Time: [phone] OT Time Calculation (min): 26 min Charges:  OT General Charges $OT Visit: 1 Visit OT Evaluation $OT Eval Low Complexity: 1 Low OT Treatments $Self Care/Home Management : 8-22 mins  Dantae Meunier OT/L Acute Rehabilitation Department  [phone] 12/03/2023, 10:04 AM

## 2023-12-03 NOTE — Progress Notes (Signed)
 PROGRESS NOTE    Michael Guzman  GEX:[id number] DOB: 09/14/1946 DOA: 11/30/2023 PCP: Park Meo, [name]     Brief Narrative:  Michael Guzman is a 78 y.o. male with medical history significant of CAD, type 2 diabetes, GERD, hypertension, hyperlipidemia who presents emerged part due to nausea vomiting diarrhea.   He was recently admitted to the hospital from 2/27 to 3/1 and was diagnosed with hypovolemic hyponatremia in setting of poor oral intake, nausea and vomiting and diarrhea.  He had low sodium at that time of 119, improved to 129 at time of discharge.  He now presents again with similar symptoms, nausea, vomiting, diarrhea.  Sodium was 116 on presentation.  He was given IV fluid in the emergency department.  Of note, his urine studies from February shows urine sodium 114, urine osmolality 584.  Concern for SIADH.  TSH, cortisol levels were normal at that time.  Patient was admitted and placed on fluid restriction diet, started on salt tablets.  New events last 24 hours / Subjective: Has a headache but otherwise doing well.  No further GI issues including nausea, vomiting, diarrhea  Assessment & Plan:   Principal Problem:   Hyponatremia Active Problems:   Hypertension (1991)   Hyperlipidemia associated with type 2 diabetes mellitus (HCC)   Aortic atherosclerosis (HCC) by Abd CT scan 2015   Depression, recurrent (HCC)   SIADH (syndrome of inappropriate ADH production) (HCC)   Euvolemic hyponatremia, ?SIADH -Urine studies from February shows urine sodium 114, urine osmolality 584 -Na 116 --> 120 --> 122 --> 118 --> 121 --> 119 --> 122 --> 122  -Consult nephrology today.  Spoke with Dr. Arlean Hopping.  He recommended 1 L fluid bolus over 4 hours and repeating BMP afterward.  Placed on regular diet without fluid restriction.  Diarrhea -GI PCR negative. DC enteric precaution  -Resolved  CAD -Aspirin PTA   Hyperlipidemia -Lipitor  Depression -Continue Wellbutrin as this  is a long-term medication, concern for withdrawal if stopped abruptly   Diabetes mellitus -Lantus, sliding scale insulin  Hypertension -Toprol  BPH -Flomax    DVT prophylaxis:  enoxaparin (LOVENOX) injection 40 mg Start: 12/01/23 1000 SCDs Start: 11/30/23 2319  Code Status: Full code Family Communication: No family at bedside Disposition Plan: Home Status is: Inpatient Remains inpatient appropriate because: Hyponatremia    Antimicrobials:  Anti-infectives (From admission, onward)    None        Objective: Vitals:   12/02/23 1229 12/02/23 2011 12/03/23 0513 12/03/23 0821  BP: 120/88 105/75 106/68 109/65  Pulse: 63 (!) 58 (!) 56 (!) 59  Resp: 18 18 18 20   Temp: (!) 97.5 F (36.4 C) 97.8 F (36.6 C) 98.7 F (37.1 C) 98.3 F (36.8 C)  TempSrc: Oral Oral Oral Oral  SpO2: 99% 100% 99% 100%  Weight:      Height:        Intake/Output Summary (Last 24 hours) at 12/03/2023 1146 Last data filed at 12/03/2023 0802 Gross per 24 hour  Intake 600 ml  Output --  Net 600 ml   Filed Weights   12/01/23 1639  Weight: 90.8 kg    Examination:  General exam: Appears calm and comfortable  Respiratory system: Clear to auscultation. Respiratory effort normal. No respiratory distress. No conversational dyspnea.  Cardiovascular system: S1 & S2 heard, RRR. No murmurs. No pedal edema. Gastrointestinal system: Abdomen is nondistended, soft and nontender. Normal bowel sounds heard. Central nervous system: Alert and oriented. No focal neurological deficits. Speech  clear.  Extremities: Symmetric in appearance  Skin: No rashes, lesions or ulcers on exposed skin  Psychiatry: Judgement and insight appear normal. Mood & affect appropriate.   Data Reviewed: I have personally reviewed following labs and imaging studies  CBC: Recent Labs  Lab 11/30/23 1916 12/01/23 0456  WBC 6.6 5.7  HGB 12.8* 11.9*  HCT 37.7* 35.8*  MCV 86.1 88.0  PLT 228 202   Basic Metabolic  Panel: Recent Labs  Lab 12/02/23 0354 12/02/23 1000 12/02/23 1534 12/02/23 2138 12/03/23 0452  NA 121* 119* 121* 122* 122*  K 4.3 4.3 4.1 4.1 3.9  CL 90* 89* 89* 90* 92*  CO2 22 22 25 22 22   GLUCOSE 121* 164* 80 110* 121*  BUN 15 16 17 16 16   CREATININE 0.95 0.94 1.04 1.02 1.03  CALCIUM 9.0 9.0 9.4 9.5 9.2   GFR: Estimated Creatinine Clearance: 69.1 mL/min (by C-G formula based on SCr of 1.03 mg/dL). Liver Function Tests: Recent Labs  Lab 11/30/23 1916  AST 24  ALT 22  ALKPHOS 55  BILITOT 1.0  PROT 8.0  ALBUMIN 4.7   Recent Labs  Lab 11/30/23 1916  LIPASE 23   No results for input(s): "AMMONIA" in the last 168 hours. Coagulation Profile: No results for input(s): "INR", "PROTIME" in the last 168 hours. Cardiac Enzymes: No results for input(s): "CKTOTAL", "CKMB", "CKMBINDEX", "TROPONINI" in the last 168 hours. BNP (last 3 results) No results for input(s): "PROBNP" in the last 8760 hours. HbA1C: No results for input(s): "HGBA1C" in the last 72 hours. CBG: Recent Labs  Lab 12/02/23 1119 12/02/23 1732 12/02/23 2039 12/03/23 0737 12/03/23 1122  GLUCAP 190* 139* 116* 138* 98   Lipid Profile: No results for input(s): "CHOL", "HDL", "LDLCALC", "TRIG", "CHOLHDL", "LDLDIRECT" in the last 72 hours. Thyroid Function Tests: No results for input(s): "TSH", "T4TOTAL", "FREET4", "T3FREE", "THYROIDAB" in the last 72 hours. Anemia Panel: No results for input(s): "VITAMINB12", "FOLATE", "FERRITIN", "TIBC", "IRON", "RETICCTPCT" in the last 72 hours. Sepsis Labs: No results for input(s): "PROCALCITON", "LATICACIDVEN" in the last 168 hours.  Recent Results (from the past 240 hours)  Resp panel by RT-PCR (RSV, Flu A&B, Covid) Anterior Nasal Swab     Status: None   Collection Time: 11/30/23  7:19 PM   Specimen: Anterior Nasal Swab  Result Value Ref Range Status   SARS Coronavirus 2 by RT PCR NEGATIVE NEGATIVE Final    Comment: (NOTE) SARS-CoV-2 target nucleic acids are  NOT DETECTED.  The SARS-CoV-2 RNA is generally detectable in upper respiratory specimens during the acute phase of infection. The lowest concentration of SARS-CoV-2 viral copies this assay can detect is 138 copies/mL. A negative result does not preclude SARS-Cov-2 infection and should not be used as the sole basis for treatment or other patient management decisions. A negative result may occur with  improper specimen collection/handling, submission of specimen other than nasopharyngeal swab, presence of viral mutation(s) within the areas targeted by this assay, and inadequate number of viral copies(<138 copies/mL). A negative result must be combined with clinical observations, patient history, and epidemiological information. The expected result is Negative.  Fact Sheet for Patients:  BloggerCourse.com  Fact Sheet for Healthcare Providers:  SeriousBroker.it  This test is no t yet approved or cleared by the [place] FDA and  has been authorized for detection and/or diagnosis of SARS-CoV-2 by FDA under an Emergency Use Authorization (EUA). This EUA will remain  in effect (meaning this test can be used) for the duration of the  COVID-19 declaration under Section 564(b)(1) of the Act, 21 U.S.C.section 360bbb-3(b)(1), unless the authorization is terminated  or revoked sooner.       Influenza A by PCR NEGATIVE NEGATIVE Final   Influenza B by PCR NEGATIVE NEGATIVE Final    Comment: (NOTE) The Xpert Xpress SARS-CoV-2/FLU/RSV plus assay is intended as an aid in the diagnosis of influenza from Nasopharyngeal swab specimens and should not be used as a sole basis for treatment. Nasal washings and aspirates are unacceptable for Xpert Xpress SARS-CoV-2/FLU/RSV testing.  Fact Sheet for Patients: BloggerCourse.com  Fact Sheet for Healthcare Providers: SeriousBroker.it  This test is not  yet approved or cleared by the [place] FDA and has been authorized for detection and/or diagnosis of SARS-CoV-2 by FDA under an Emergency Use Authorization (EUA). This EUA will remain in effect (meaning this test can be used) for the duration of the COVID-19 declaration under Section 564(b)(1) of the Act, 21 U.S.C. section 360bbb-3(b)(1), unless the authorization is terminated or revoked.     Resp Syncytial Virus by PCR NEGATIVE NEGATIVE Final    Comment: (NOTE) Fact Sheet for Patients: BloggerCourse.com  Fact Sheet for Healthcare Providers: SeriousBroker.it  This test is not yet approved or cleared by the [place] FDA and has been authorized for detection and/or diagnosis of SARS-CoV-2 by FDA under an Emergency Use Authorization (EUA). This EUA will remain in effect (meaning this test can be used) for the duration of the COVID-19 declaration under Section 564(b)(1) of the Act, 21 U.S.C. section 360bbb-3(b)(1), unless the authorization is terminated or revoked.  Performed at [facility], [address], [place], [place] [address]   Gastrointestinal Panel by PCR , Stool     Status: None   Collection Time: 12/01/23  4:06 AM   Specimen: Stool  Result Value Ref Range Status   Campylobacter species NOT DETECTED NOT DETECTED Final   Plesimonas shigelloides NOT DETECTED NOT DETECTED Final   Salmonella species NOT DETECTED NOT DETECTED Final   Yersinia enterocolitica NOT DETECTED NOT DETECTED Final   Vibrio species NOT DETECTED NOT DETECTED Final   Vibrio cholerae NOT DETECTED NOT DETECTED Final   Enteroaggregative E coli (EAEC) NOT DETECTED NOT DETECTED Final   Enteropathogenic E coli (EPEC) NOT DETECTED NOT DETECTED Final   Enterotoxigenic E coli (ETEC) NOT DETECTED NOT DETECTED Final   Shiga like toxin producing E coli (STEC) NOT DETECTED NOT DETECTED Final   Shigella/Enteroinvasive E coli (EIEC) NOT  DETECTED NOT DETECTED Final   Cryptosporidium NOT DETECTED NOT DETECTED Final   Cyclospora cayetanensis NOT DETECTED NOT DETECTED Final   Entamoeba histolytica NOT DETECTED NOT DETECTED Final   Giardia lamblia NOT DETECTED NOT DETECTED Final   Adenovirus F40/41 NOT DETECTED NOT DETECTED Final   Astrovirus NOT DETECTED NOT DETECTED Final   Norovirus GI/GII NOT DETECTED NOT DETECTED Final   Rotavirus A NOT DETECTED NOT DETECTED Final   Sapovirus (I, II, IV, and V) NOT DETECTED NOT DETECTED Final    Comment: Performed at [facility], [address] [place], [place] [address]      Radiology Studies: No results found.     Scheduled Meds:  atorvastatin  10 mg Oral Daily   buPROPion  300 mg Oral Daily   enoxaparin (LOVENOX) injection  40 mg Subcutaneous Q24H   feeding supplement (NEPRO CARB STEADY)  237 mL Oral BID BM   glycopyrrolate  2 mg Oral TID   insulin aspart  0-24 Units Subcutaneous TID WC  insulin glargine  20 Units Subcutaneous Daily   metoprolol succinate  12.5 mg Oral Daily   tamsulosin  0.4 mg Oral QHS   Continuous Infusions:   LOS: 3 days   Time spent: 35 minutes   Noralee Stain, DO [facility] 12/03/2023, 11:46 AM   Available via Epic secure chat 7am-7pm After these hours, please refer to coverage provider listed on [email or web address]

## 2023-12-04 DIAGNOSIS — E871 Hypo-osmolality and hyponatremia: Secondary | ICD-10-CM | POA: Diagnosis not present

## 2023-12-04 LAB — BASIC METABOLIC PANEL
Anion gap: 7 (ref 5–15)
Anion gap: 8 (ref 5–15)
BUN: 13 mg/dL (ref 8–23)
BUN: 11 mg/dL (ref 8–23)
CO2: 22 mmol/L (ref 22–32)
CO2: 22 mmol/L (ref 22–32)
Calcium: 9.1 mg/dL (ref 8.9–10.3)
Calcium: 9.2 mg/dL (ref 8.9–10.3)
Chloride: 99 mmol/L (ref 98–111)
Chloride: 99 mmol/L (ref 98–111)
Creatinine, Ser: 0.79 mg/dL (ref 0.61–1.24)
Creatinine, Ser: 1.03 mg/dL (ref 0.61–1.24)
GFR, Estimated: >60 mL/min (ref >60)
GFR, Estimated: >60 mL/min (ref >60)
Glucose, Bld: 103 mg/dL — ABNORMAL HIGH (ref 70–99)
Glucose, Bld: 94 mg/dL (ref 70–99)
Potassium: 4 mmol/L (ref 3.5–5.1)
Potassium: 4.2 mmol/L (ref 3.5–5.1)
Sodium: 128 mmol/L — ABNORMAL LOW (ref 135–145)
Sodium: 129 mmol/L — ABNORMAL LOW (ref 135–145)

## 2023-12-04 LAB — GLUCOSE, CAPILLARY
Glucose-Capillary: 102 mg/dL — ABNORMAL HIGH (ref 70–99)
Glucose-Capillary: 165 mg/dL — ABNORMAL HIGH (ref 70–99)
Glucose-Capillary: 97 mg/dL (ref 70–99)
Glucose-Capillary: 140 mg/dL — ABNORMAL HIGH (ref 70–99)

## 2023-12-04 LAB — OSMOLALITY, URINE: Osmolality, Ur: 361 mosm/kg (ref 300–900)

## 2023-12-04 LAB — SODIUM: Sodium: 128 mmol/L — ABNORMAL LOW (ref 135–145)

## 2023-12-04 MED ORDER — SODIUM CHLORIDE 0.9 % IV SOLN: INTRAVENOUS | Status: DC

## 2023-12-04 NOTE — Progress Notes (Signed)
 [facility] Progress Note  Subjective:    Vitals:   12/03/23 2114 12/04/23 0423 12/04/23 0438 12/04/23 1256  BP: 123/76 121/74  117/71  Pulse: 61 (!) 52  (!) 58  Resp: 20 19  16   Temp: 97.6 F (36.4 C) 97.7 F (36.5 C)  98.6 F (37 C)  TempSrc: Oral Oral  Oral  SpO2: 100% 100%  100%  Weight:   89.2 kg   Height:        Exam: Gen alert, no distress No jvd or bruits Chest clear bilat to bases RRR no MRG Abd soft ntnd no mass or ascites +bs GU nl male MS no joint effusions or deformity Ext no LE edema Neuro is alert, Ox 3 , nf        Renal-related home meds: Toprol xl 12.5 every day Mobic 15 mg every day Others: asa, statin, wellbutrin xl, gabapentin, insulin, metformin, singulair, flomax, sl ntg        Assessment/ Plan: Hyponatremia - in pt w/ hx of N/V and diarrhea. Pt had similar recent admit which was rx'd as SIADH. Na+ here was 116 originally.  No hx heart, liver or renal failure, no adrenal / thyroid disease. Pt rec'd 1 L NS the 1st night here and Na+ improved to 122 overnight, then dropped to 118. Salt tabs and fluid restriction was started and Na+ today improved to 122 but then was stuck there. Saw pt yest and gave boluses of NS and IV 0.9% overnight. He has responded well to this w/ Na+ up to 128 this am. This rapid correction w/ isotonic saline means he had simple volume depletion (related to GI losses). No other suggestions at this time, will sign off. Have d/w pmd.  N/V, diarrhea - per pmd H/o CAD DM2 HTN - on BB      Rob Monquie Fulgham MD  [facility] 12/04/2023, 1:46 PM  Recent Labs  Lab 11/30/23 1916 12/01/23 0212 12/01/23 0456 12/01/23 0839 12/03/23 1309 12/04/23 0252  HGB 12.8*  --  11.9*  --   --   --   ALBUMIN 4.7  --   --   --   --   --   CALCIUM 9.3   < >  --    < > 8.8* 9.1  CREATININE 0.67   < >  --    < > 0.92 0.79  K 4.3   < >  --    < > 3.9 4.0   < > = values in this interval not displayed.   No results for input(s): "IRON",  "TIBC", "FERRITIN" in the last 168 hours. Inpatient medications:  atorvastatin  10 mg Oral Daily   buPROPion  300 mg Oral Daily   enoxaparin (LOVENOX) injection  40 mg Subcutaneous Q24H   feeding supplement (NEPRO CARB STEADY)  237 mL Oral BID BM   glycopyrrolate  2 mg Oral TID   insulin aspart  0-24 Units Subcutaneous TID WC   insulin glargine  20 Units Subcutaneous Daily   metoprolol succinate  12.5 mg Oral Daily   tamsulosin  0.4 mg Oral QHS    sodium chloride 65 mL/hr at 12/03/23 2130   acetaminophen **OR** acetaminophen, ondansetron **OR** ondansetron (ZOFRAN) IV, sodium chloride

## 2023-12-04 NOTE — Progress Notes (Addendum)
 PROGRESS NOTE    Michael Guzman  ZOX:[id number] DOB: 10-05-1945 DOA: 11/30/2023 PCP: Park Meo, [name]     Brief Narrative:  Michael Guzman is a 79 y.o. male with medical history significant of CAD, type 2 diabetes, GERD, hypertension, hyperlipidemia who presents emerged part due to nausea vomiting diarrhea.   He was recently admitted to the hospital from 2/27 to 3/1 and was diagnosed with hypovolemic hyponatremia in setting of poor oral intake, nausea and vomiting and diarrhea.  He had low sodium at that time of 119, improved to 129 at time of discharge.  He now presents again with similar symptoms, nausea, vomiting, diarrhea.  Sodium was 116 on presentation.  He was given IV fluid in the emergency department.  Of note, his urine studies from February shows urine sodium 114, urine osmolality 584.  Concern for SIADH.  TSH, cortisol levels were normal at that time.  Patient was admitted and placed on fluid restriction diet, started on salt tablets.   Due to lack of improvement in sodium, nephrology was consulted.  Patient was taken off salt tabs and fluid restriction diet.  He was given normal saline with gradual improvement in sodium levels.  New events last 24 hours / Subjective: Headache has improved.  No other complaints.  Assessment & Plan:   Principal Problem:   Hyponatremia Active Problems:   Hypertension (1991)   Hyperlipidemia associated with type 2 diabetes mellitus (HCC)   Aortic atherosclerosis (HCC) by Abd CT scan 2015   Depression, recurrent (HCC)   Hyponatremia -Urine studies from February shows urine sodium 114, urine osmolality 584 -Na 116 --> 120 --> (started fluid restriction diet) 122 --> 118 -->  (started salt tab) 121 --> 119 --> 122 --> 122 --> (started IV fluid) 124 --> 124 --> 128 --> 128 -Consulted nephrology.  Discussed with Dr. Arta Silence today.  Hyponatremia thought to be due to volume depletion.  Nephrology signed off today -Continue IV fluid and  trend BMP 1 more day  Diarrhea -GI PCR negative. DC enteric precaution  -Resolved  CAD -Aspirin PTA   Hyperlipidemia -Lipitor  Depression -Continue Wellbutrin as this is a long-term medication, concern for withdrawal if stopped abruptly   Diabetes mellitus -Lantus, sliding scale insulin  Hypertension -Toprol  BPH -Flomax    DVT prophylaxis:  enoxaparin (LOVENOX) injection 40 mg Start: 12/01/23 1000 SCDs Start: 11/30/23 2319  Code Status: Full code Family Communication: No family at bedside Disposition Plan: Home Status is: Inpatient Remains inpatient appropriate because: Hyponatremia    Antimicrobials:  Anti-infectives (From admission, onward)    None        Objective: Vitals:   12/03/23 1443 12/03/23 2114 12/04/23 0423 12/04/23 0438  BP:  123/76 121/74   Pulse:  61 (!) 52   Resp:  20 19   Temp:  97.6 F (36.4 C) 97.7 F (36.5 C)   TempSrc:  Oral Oral   SpO2:  100% 100%   Weight: 89.8 kg   89.2 kg  Height:        Intake/Output Summary (Last 24 hours) at 12/04/2023 1244 Last data filed at 12/04/2023 0747 Gross per 24 hour  Intake 1190.67 ml  Output 2900 ml  Net -1709.33 ml   Filed Weights   12/01/23 1639 12/03/23 1443 12/04/23 0438  Weight: 90.8 kg 89.8 kg 89.2 kg    Examination:  General exam: Appears calm and comfortable  Respiratory system: Clear to auscultation. Respiratory effort normal. No respiratory distress. No conversational dyspnea.  Cardiovascular system: S1 & S2 heard, RRR. No murmurs. No pedal edema. Gastrointestinal system: Abdomen is nondistended, soft and nontender. Normal bowel sounds heard. Central nervous system: Alert and oriented. No focal neurological deficits. Speech clear.  Extremities: Symmetric in appearance  Skin: No rashes, lesions or ulcers on exposed skin  Psychiatry: Judgement and insight appear normal. Mood & affect appropriate.   Data Reviewed: I have personally reviewed following labs and imaging  studies  CBC: Recent Labs  Lab 11/30/23 1916 12/01/23 0456  WBC 6.6 5.7  HGB 12.8* 11.9*  HCT 37.7* 35.8*  MCV 86.1 88.0  PLT 228 202   Basic Metabolic Panel: Recent Labs  Lab 12/02/23 1534 12/02/23 2138 12/03/23 0452 12/03/23 1309 12/03/23 2044 12/04/23 0251 12/04/23 0252  NA 121* 122* 122* 124* 124* 128* 128*  K 4.1 4.1 3.9 3.9  --   --  4.0  CL 89* 90* 92* 95*  --   --  99  CO2 25 22 22  20*  --   --  22  GLUCOSE 80 110* 121* 135*  --   --  103*  BUN 17 16 16 14   --   --  13  CREATININE 1.04 1.02 1.03 0.92  --   --  0.79  CALCIUM 9.4 9.5 9.2 8.8*  --   --  9.1   GFR: Estimated Creatinine Clearance: 88.3 mL/min (by C-G formula based on SCr of 0.79 mg/dL). Liver Function Tests: Recent Labs  Lab 11/30/23 1916  AST 24  ALT 22  ALKPHOS 55  BILITOT 1.0  PROT 8.0  ALBUMIN 4.7   Recent Labs  Lab 11/30/23 1916  LIPASE 23   No results for input(s): "AMMONIA" in the last 168 hours. Coagulation Profile: No results for input(s): "INR", "PROTIME" in the last 168 hours. Cardiac Enzymes: No results for input(s): "CKTOTAL", "CKMB", "CKMBINDEX", "TROPONINI" in the last 168 hours. BNP (last 3 results) No results for input(s): "PROBNP" in the last 8760 hours. HbA1C: No results for input(s): "HGBA1C" in the last 72 hours. CBG: Recent Labs  Lab 12/03/23 1122 12/03/23 1621 12/03/23 2112 12/04/23 0744 12/04/23 1109  GLUCAP 98 115* 120* 102* 165*   Lipid Profile: No results for input(s): "CHOL", "HDL", "LDLCALC", "TRIG", "CHOLHDL", "LDLDIRECT" in the last 72 hours. Thyroid Function Tests: No results for input(s): "TSH", "T4TOTAL", "FREET4", "T3FREE", "THYROIDAB" in the last 72 hours. Anemia Panel: No results for input(s): "VITAMINB12", "FOLATE", "FERRITIN", "TIBC", "IRON", "RETICCTPCT" in the last 72 hours. Sepsis Labs: No results for input(s): "PROCALCITON", "LATICACIDVEN" in the last 168 hours.  Recent Results (from the past 240 hours)  Resp panel by RT-PCR  (RSV, Flu A&B, Covid) Anterior Nasal Swab     Status: None   Collection Time: 11/30/23  7:19 PM   Specimen: Anterior Nasal Swab  Result Value Ref Range Status   SARS Coronavirus 2 by RT PCR NEGATIVE NEGATIVE Final    Comment: (NOTE) SARS-CoV-2 target nucleic acids are NOT DETECTED.  The SARS-CoV-2 RNA is generally detectable in upper respiratory specimens during the acute phase of infection. The lowest concentration of SARS-CoV-2 viral copies this assay can detect is 138 copies/mL. A negative result does not preclude SARS-Cov-2 infection and should not be used as the sole basis for treatment or other patient management decisions. A negative result may occur with  improper specimen collection/handling, submission of specimen other than nasopharyngeal swab, presence of viral mutation(s) within the areas targeted by this assay, and inadequate number of viral copies(<138 copies/mL). A negative result  must be combined with clinical observations, patient history, and epidemiological information. The expected result is Negative.  Fact Sheet for Patients:  BloggerCourse.com  Fact Sheet for Healthcare Providers:  SeriousBroker.it  This test is no t yet approved or cleared by the [place] FDA and  has been authorized for detection and/or diagnosis of SARS-CoV-2 by FDA under an Emergency Use Authorization (EUA). This EUA will remain  in effect (meaning this test can be used) for the duration of the COVID-19 declaration under Section 564(b)(1) of the Act, 21 U.S.C.section 360bbb-3(b)(1), unless the authorization is terminated  or revoked sooner.       Influenza A by PCR NEGATIVE NEGATIVE Final   Influenza B by PCR NEGATIVE NEGATIVE Final    Comment: (NOTE) The Xpert Xpress SARS-CoV-2/FLU/RSV plus assay is intended as an aid in the diagnosis of influenza from Nasopharyngeal swab specimens and should not be used as a sole basis for  treatment. Nasal washings and aspirates are unacceptable for Xpert Xpress SARS-CoV-2/FLU/RSV testing.  Fact Sheet for Patients: BloggerCourse.com  Fact Sheet for Healthcare Providers: SeriousBroker.it  This test is not yet approved or cleared by the [place] FDA and has been authorized for detection and/or diagnosis of SARS-CoV-2 by FDA under an Emergency Use Authorization (EUA). This EUA will remain in effect (meaning this test can be used) for the duration of the COVID-19 declaration under Section 564(b)(1) of the Act, 21 U.S.C. section 360bbb-3(b)(1), unless the authorization is terminated or revoked.     Resp Syncytial Virus by PCR NEGATIVE NEGATIVE Final    Comment: (NOTE) Fact Sheet for Patients: BloggerCourse.com  Fact Sheet for Healthcare Providers: SeriousBroker.it  This test is not yet approved or cleared by the [place] FDA and has been authorized for detection and/or diagnosis of SARS-CoV-2 by FDA under an Emergency Use Authorization (EUA). This EUA will remain in effect (meaning this test can be used) for the duration of the COVID-19 declaration under Section 564(b)(1) of the Act, 21 U.S.C. section 360bbb-3(b)(1), unless the authorization is terminated or revoked.  Performed at [facility], [address] [place], [place] [address]   Gastrointestinal Panel by PCR , Stool     Status: None   Collection Time: 12/01/23  4:06 AM   Specimen: Stool  Result Value Ref Range Status   Campylobacter species NOT DETECTED NOT DETECTED Final   Plesimonas shigelloides NOT DETECTED NOT DETECTED Final   Salmonella species NOT DETECTED NOT DETECTED Final   Yersinia enterocolitica NOT DETECTED NOT DETECTED Final   Vibrio species NOT DETECTED NOT DETECTED Final   Vibrio cholerae NOT DETECTED NOT DETECTED Final   Enteroaggregative E coli (EAEC) NOT  DETECTED NOT DETECTED Final   Enteropathogenic E coli (EPEC) NOT DETECTED NOT DETECTED Final   Enterotoxigenic E coli (ETEC) NOT DETECTED NOT DETECTED Final   Shiga like toxin producing E coli (STEC) NOT DETECTED NOT DETECTED Final   Shigella/Enteroinvasive E coli (EIEC) NOT DETECTED NOT DETECTED Final   Cryptosporidium NOT DETECTED NOT DETECTED Final   Cyclospora cayetanensis NOT DETECTED NOT DETECTED Final   Entamoeba histolytica NOT DETECTED NOT DETECTED Final   Giardia lamblia NOT DETECTED NOT DETECTED Final   Adenovirus F40/41 NOT DETECTED NOT DETECTED Final   Astrovirus NOT DETECTED NOT DETECTED Final   Norovirus GI/GII NOT DETECTED NOT DETECTED Final   Rotavirus A NOT DETECTED NOT DETECTED Final   Sapovirus (I, II, IV, and V) NOT DETECTED NOT DETECTED Final    Comment: Performed at [employer]  [facility], [address], [place], [place] [address]      Radiology Studies: No results found.     Scheduled Meds:  atorvastatin  10 mg Oral Daily   buPROPion  300 mg Oral Daily   enoxaparin (LOVENOX) injection  40 mg Subcutaneous Q24H   feeding supplement (NEPRO CARB STEADY)  237 mL Oral BID BM   glycopyrrolate  2 mg Oral TID   insulin aspart  0-24 Units Subcutaneous TID WC   insulin glargine  20 Units Subcutaneous Daily   metoprolol succinate  12.5 mg Oral Daily   tamsulosin  0.4 mg Oral QHS   Continuous Infusions:  sodium chloride 65 mL/hr at 12/03/23 2130     LOS: 4 days   Time spent: 25 minutes   Noralee Stain, DO [facility] 12/04/2023, 12:44 PM   Available via Epic secure chat 7am-7pm After these hours, please refer to coverage provider listed on [email or web address]

## 2023-12-05 DIAGNOSIS — E871 Hypo-osmolality and hyponatremia: Secondary | ICD-10-CM | POA: Diagnosis not present

## 2023-12-05 LAB — BASIC METABOLIC PANEL
Anion gap: 8 (ref 5–15)
BUN: 11 mg/dL (ref 8–23)
CO2: 21 mmol/L — ABNORMAL LOW (ref 22–32)
Calcium: 9.1 mg/dL (ref 8.9–10.3)
Chloride: 100 mmol/L (ref 98–111)
Creatinine, Ser: 0.77 mg/dL (ref 0.61–1.24)
GFR, Estimated: >60 mL/min (ref >60)
Glucose, Bld: 185 mg/dL — ABNORMAL HIGH (ref 70–99)
Potassium: 4.1 mmol/L (ref 3.5–5.1)
Sodium: 129 mmol/L — ABNORMAL LOW (ref 135–145)

## 2023-12-05 LAB — GLUCOSE, CAPILLARY: Glucose-Capillary: 108 mg/dL — ABNORMAL HIGH (ref 70–99)

## 2023-12-05 NOTE — Discharge Summary (Signed)
 Physician Discharge Summary  CADENCE HASLAM WUJ:[id number] DOB: 1946/08/23 DOA: 11/30/2023  PCP: Park Meo, [name]  Admit date: 11/30/2023 Discharge date: 12/05/2023  Admitted From: Home Disposition: Home  Recommendations for Outpatient Follow-up:  Follow up with PCP as scheduled this week, repeat BMP to follow hyponatremia  Discharge Condition: Stable improved CODE STATUS: Full code Diet recommendation: Regular diet  Brief/Interim Summary: Michael Guzman is a 78 y.o. male with medical history significant of CAD, type 2 diabetes, GERD, hypertension, hyperlipidemia who presents emerged part due to nausea vomiting diarrhea.    He was recently admitted to the hospital from 2/27 to 3/1 and was diagnosed with hypovolemic hyponatremia in setting of poor oral intake, nausea and vomiting and diarrhea.  He had low sodium at that time of 119, improved to 129 at time of discharge.  He now presents again with similar symptoms, nausea, vomiting, diarrhea.  Sodium was 116 on presentation.  He was given IV fluid in the emergency department.  Of note, his urine studies from February shows urine sodium 114, urine osmolality 584.  Concern for SIADH.  TSH, cortisol levels were normal at that time.  Patient was admitted and placed on fluid restriction diet, started on salt tablets.    Due to lack of improvement in sodium, nephrology was consulted.  Patient was taken off salt tabs and fluid restriction diet.  He was given normal saline with gradual improvement in sodium levels.  His sodium level was 129 at time of discharge, this is close to his baseline.  Discharge Diagnoses:   Principal Problem:   Hyponatremia Active Problems:   Hypertension (1991)   Hyperlipidemia associated with type 2 diabetes mellitus (HCC)   Aortic atherosclerosis (HCC) by Abd CT scan 2015   Depression, recurrent (HCC)    Hyponatremia -Urine studies from February shows urine sodium 114, urine osmolality 584 -Na 116 -->  120 --> (started fluid restriction diet) 122 --> 118 -->  (started salt tab) 121 --> 119 --> 122 --> 122 --> (started IV fluid) 124 --> 124 --> 128 --> 128 --> 129 --> 129 (discharge date)  -Consulted nephrology.  Hyponatremia thought to be due to volume depletion.  Nephrology signed off    Diarrhea -GI PCR negative. DC enteric precaution  -Resolved   CAD -Aspirin PTA    Hyperlipidemia -Lipitor   Depression -Continue Wellbutrin as this is a long-term medication, concern for withdrawal if stopped abruptly    Diabetes mellitus -Lantus, sliding scale insulin   Hypertension -Toprol   BPH -Flomax     Discharge Instructions  Discharge Instructions     Call MD for:  difficulty breathing, headache or visual disturbances   Complete by: As directed    Call MD for:  extreme fatigue   Complete by: As directed    Call MD for:  persistant dizziness or light-headedness   Complete by: As directed    Call MD for:  persistant nausea and vomiting   Complete by: As directed    Call MD for:  severe uncontrolled pain   Complete by: As directed    Call MD for:  temperature >100.4   Complete by: As directed    Diet general   Complete by: As directed    Discharge instructions   Complete by: As directed    You were cared for by a hospitalist during your hospital stay. If you have any questions about your discharge medications or the care you received while you were in the hospital after  you are discharged, you can call the unit and ask to speak with the hospitalist on call if the hospitalist that took care of you is not available. Once you are discharged, your primary care physician will handle any further medical issues. Please note that NO REFILLS for any discharge medications will be authorized once you are discharged, as it is imperative that you return to your primary care physician (or establish a relationship with a primary care physician if you do not have one) for your aftercare needs so  that they can reassess your need for medications and monitor your lab values.   Increase activity slowly   Complete by: As directed       Allergies as of 12/05/2023   No Known Allergies      Medication List     STOP taking these medications    meloxicam 15 MG tablet Commonly known as: MOBIC       TAKE these medications    aspirin EC 81 MG tablet Take 1 tablet (81 mg total) by mouth daily.   atorvastatin 10 MG tablet Commonly known as: LIPITOR TAKE 1 TABLET BY MOUTH DAILY   buPROPion 300 MG 24 hr tablet Commonly known as: WELLBUTRIN XL TAKE 1 TABLET BY MOUTH DAILY FOR MOOD, FOCUS AND CONCENTRATION   gabapentin 800 MG tablet Commonly known as: NEURONTIN TAKE 1 TABLET BY MOUTH 3 TO 4  TIMES DAILY FOR PAIN, HOT  FLASHES AND SWEATS What changed: See the new instructions.   glycopyrrolate 2 MG tablet Commonly known as: ROBINUL Take  1 tablet  3 x / day for Excessive Sweating  TAKE ONE TABLET BY                                                 MOUTH   HumuLIN 70/30 (70-30) 100 UNIT/ML injection Generic drug: insulin NPH-regular Human INJECT SUBCUTANEOUSLY 50 UNITS  TWICE DAILY What changed: See the new instructions.   Insulin Syringes (Disposable) U-100 1 ML Misc 50 Units by Does not apply route 2 (two) times daily. inject 50 units into skin 2 x /day   Magnesium 500 MG Tabs Take 500 mg by mouth daily.   metFORMIN 500 MG 24 hr tablet Commonly known as: GLUCOPHAGE-XR TAKE 2 TABLETS BY MOUTH TWICE  DAILY WITH MEALS FOR DIABETES   metoprolol succinate 25 MG 24 hr tablet Commonly known as: TOPROL-XL TAKE ONE-HALF TABLET BY MOUTH  DAILY   montelukast 10 MG tablet Commonly known as: SINGULAIR TAKE 1 TABLET BY MOUTH DAILY FOR ALLERGIES   nitroGLYCERIN 0.4 MG SL tablet Commonly known as: NITROSTAT PLACE 1 TABLET UNDER THE TONGUE EVERY 5 MINUTES AS NEEDED FOR CHEST PAIN   ONE TOUCH ULTRA 2 w/Device Kit Use as directed   OneTouch Delica Plus Lancet33G Misc CHECK  BLOOD SUGAR 3 TIMES  DAILY   OneTouch Ultra test strip Generic drug: glucose blood CHECK BLOOD SUGAR 3 TIMES  DAILY   pantoprazole 40 MG tablet Commonly known as: PROTONIX TAKE 1 TABLET BY MOUTH DAILY TO  PREVENT HEARTBURN AND  INDIGESTION   SUPER B COMPLEX PO Take 1 tablet by mouth daily.   tamsulosin 0.4 MG Caps capsule Commonly known as: FLOMAX Take 0.4 mg by mouth at bedtime.   vitamin C 1000 MG tablet Take 1,000 mg by mouth daily.   Vitamin D3 250 MCG (  10000 UT) capsule Take 10,000 Units by mouth daily.   Vitamin E 450 MG (1000 UT) Caps Take 450 mg by mouth daily.   zinc gluconate 50 MG tablet Take 50 mg by mouth daily.        No Known Allergies  Consultations: Nephrology   Procedures/Studies: CT ABDOMEN PELVIS W CONTRAST Result Date: 11/30/2023 CLINICAL DATA:  Abdominal pain EXAM: CT ABDOMEN AND PELVIS WITH CONTRAST TECHNIQUE: Multidetector CT imaging of the abdomen and pelvis was performed using the standard protocol following bolus administration of intravenous contrast. RADIATION DOSE REDUCTION: This exam was performed according to the departmental dose-optimization program which includes automated exposure control, adjustment of the mA and/or kV according to patient size and/or use of iterative reconstruction technique. CONTRAST:  OMNIPAQUE IOHEXOL 300 MG/ML  SOLN COMPARISON:  01/16/2016 FINDINGS: Lower chest: Trace bilateral pleural effusions. Dependent atelectasis in the lower lobes. Coronary artery calcifications. Hepatobiliary: Diffuse low-density throughout the liver compatible with fatty infiltration. No focal abnormality. Gallbladder unremarkable. Pancreas: No focal abnormality or ductal dilatation. Spleen: No focal abnormality.  Normal size. Adrenals/Urinary Tract: Bilateral simple appearing renal cysts. No follow-up imaging recommended. No stones or hydronephrosis. Urinary bladder and adrenal glands unremarkable. Stomach/Bowel: Scattered colonic  diverticula. No active diverticulitis. Stomach and small bowel decompressed. No bowel obstruction or inflammatory process. Vascular/Lymphatic: No evidence of aneurysm or adenopathy. Reproductive: No visible focal abnormality. Other: No free fluid or free air. Musculoskeletal: Prior right hip replacement. No acute bony abnormality. IMPRESSION: Trace bilateral pleural effusions. Dependent atelectasis in the lower lobes. Coronary artery disease. Hepatic steatosis. Scattered colonic diverticulosis. No acute findings in the abdomen or pelvis. Electronically Signed   By: Charlett Nose M.D.   On: 11/30/2023 22:55   DG CHEST PORT 1 VIEW Result Date: 11/12/2023 CLINICAL DATA:  Cough with nausea and vomiting, initial encounter EXAM: PORTABLE CHEST 1 VIEW COMPARISON:  01/11/2021 FINDINGS: Cardiac shadow is within normal limits. Aortic calcifications are noted. The lungs are well aerated bilaterally. No focal infiltrate or effusion is noted. No bony abnormality is seen. IMPRESSION: No acute abnormality noted. Electronically Signed   By: Alcide Clever M.D.   On: 11/12/2023 21:30      Discharge Exam: Vitals:   12/04/23 2034 12/05/23 0424  BP: 119/81 139/71  Pulse: (!) 59 (!) 56  Resp: 18 18  Temp: 98 F (36.7 C) 97.6 F (36.4 C)  SpO2: 100% 100%    General: Pt is alert, awake, not in acute distress Cardiovascular: RRR, S1/S2 +, no edema Respiratory: CTA bilaterally, no wheezing, no rhonchi, no respiratory distress, no conversational dyspnea  Abdominal: Soft, NT, ND, bowel sounds + Extremities: no edema, no cyanosis Psych: Normal mood and affect, stable judgement and insight     The results of significant diagnostics from this hospitalization (including imaging, microbiology, ancillary and laboratory) are listed below for reference.     Microbiology: Recent Results (from the past 240 hours)  Resp panel by RT-PCR (RSV, Flu A&B, Covid) Anterior Nasal Swab     Status: None   Collection Time: 11/30/23   7:19 PM   Specimen: Anterior Nasal Swab  Result Value Ref Range Status   SARS Coronavirus 2 by RT PCR NEGATIVE NEGATIVE Final    Comment: (NOTE) SARS-CoV-2 target nucleic acids are NOT DETECTED.  The SARS-CoV-2 RNA is generally detectable in upper respiratory specimens during the acute phase of infection. The lowest concentration of SARS-CoV-2 viral copies this assay can detect is 138 copies/mL. A negative result does not preclude SARS-Cov-2  infection and should not be used as the sole basis for treatment or other patient management decisions. A negative result may occur with  improper specimen collection/handling, submission of specimen other than nasopharyngeal swab, presence of viral mutation(s) within the areas targeted by this assay, and inadequate number of viral copies(<138 copies/mL). A negative result must be combined with clinical observations, patient history, and epidemiological information. The expected result is Negative.  Fact Sheet for Patients:  BloggerCourse.com  Fact Sheet for Healthcare Providers:  SeriousBroker.it  This test is no t yet approved or cleared by the [place] FDA and  has been authorized for detection and/or diagnosis of SARS-CoV-2 by FDA under an Emergency Use Authorization (EUA). This EUA will remain  in effect (meaning this test can be used) for the duration of the COVID-19 declaration under Section 564(b)(1) of the Act, 21 U.S.C.section 360bbb-3(b)(1), unless the authorization is terminated  or revoked sooner.       Influenza A by PCR NEGATIVE NEGATIVE Final   Influenza B by PCR NEGATIVE NEGATIVE Final    Comment: (NOTE) The Xpert Xpress SARS-CoV-2/FLU/RSV plus assay is intended as an aid in the diagnosis of influenza from Nasopharyngeal swab specimens and should not be used as a sole basis for treatment. Nasal washings and aspirates are unacceptable for Xpert Xpress  SARS-CoV-2/FLU/RSV testing.  Fact Sheet for Patients: BloggerCourse.com  Fact Sheet for Healthcare Providers: SeriousBroker.it  This test is not yet approved or cleared by the [place] FDA and has been authorized for detection and/or diagnosis of SARS-CoV-2 by FDA under an Emergency Use Authorization (EUA). This EUA will remain in effect (meaning this test can be used) for the duration of the COVID-19 declaration under Section 564(b)(1) of the Act, 21 U.S.C. section 360bbb-3(b)(1), unless the authorization is terminated or revoked.     Resp Syncytial Virus by PCR NEGATIVE NEGATIVE Final    Comment: (NOTE) Fact Sheet for Patients: BloggerCourse.com  Fact Sheet for Healthcare Providers: SeriousBroker.it  This test is not yet approved or cleared by the [place] FDA and has been authorized for detection and/or diagnosis of SARS-CoV-2 by FDA under an Emergency Use Authorization (EUA). This EUA will remain in effect (meaning this test can be used) for the duration of the COVID-19 declaration under Section 564(b)(1) of the Act, 21 U.S.C. section 360bbb-3(b)(1), unless the authorization is terminated or revoked.  Performed at [facility], [address], [place], [place] [address]   Gastrointestinal Panel by PCR , Stool     Status: None   Collection Time: 12/01/23  4:06 AM   Specimen: Stool  Result Value Ref Range Status   Campylobacter species NOT DETECTED NOT DETECTED Final   Plesimonas shigelloides NOT DETECTED NOT DETECTED Final   Salmonella species NOT DETECTED NOT DETECTED Final   Yersinia enterocolitica NOT DETECTED NOT DETECTED Final   Vibrio species NOT DETECTED NOT DETECTED Final   Vibrio cholerae NOT DETECTED NOT DETECTED Final   Enteroaggregative E coli (EAEC) NOT DETECTED NOT DETECTED Final   Enteropathogenic E coli (EPEC) NOT DETECTED  NOT DETECTED Final   Enterotoxigenic E coli (ETEC) NOT DETECTED NOT DETECTED Final   Shiga like toxin producing E coli (STEC) NOT DETECTED NOT DETECTED Final   Shigella/Enteroinvasive E coli (EIEC) NOT DETECTED NOT DETECTED Final   Cryptosporidium NOT DETECTED NOT DETECTED Final   Cyclospora cayetanensis NOT DETECTED NOT DETECTED Final   Entamoeba histolytica NOT DETECTED NOT DETECTED Final   Giardia lamblia NOT DETECTED NOT DETECTED Final  Adenovirus F40/41 NOT DETECTED NOT DETECTED Final   Astrovirus NOT DETECTED NOT DETECTED Final   Norovirus GI/GII NOT DETECTED NOT DETECTED Final   Rotavirus A NOT DETECTED NOT DETECTED Final   Sapovirus (I, II, IV, and V) NOT DETECTED NOT DETECTED Final    Comment: Performed at [facility], [address] [place], [place] [address]     Labs: BNP (last 3 results) No results for input(s): "BNP" in the last 8760 hours. Basic Metabolic Panel: Recent Labs  Lab 12/03/23 0452 12/03/23 1309 12/03/23 2044 12/04/23 0251 12/04/23 0252 12/04/23 1659 12/05/23 0817  NA 122* 124* 124* 128* 128* 129* 129*  K 3.9 3.9  --   --  4.0 4.2 4.1  CL 92* 95*  --   --  99 99 100  CO2 22 20*  --   --  22 22 21*  GLUCOSE 121* 135*  --   --  103* 94 185*  BUN 16 14  --   --  13 11 11   CREATININE 1.03 0.92  --   --  0.79 1.03 0.77  CALCIUM 9.2 8.8*  --   --  9.1 9.2 9.1   Liver Function Tests: Recent Labs  Lab 11/30/23 1916  AST 24  ALT 22  ALKPHOS 55  BILITOT 1.0  PROT 8.0  ALBUMIN 4.7   Recent Labs  Lab 11/30/23 1916  LIPASE 23   No results for input(s): "AMMONIA" in the last 168 hours. CBC: Recent Labs  Lab 11/30/23 1916 12/01/23 0456  WBC 6.6 5.7  HGB 12.8* 11.9*  HCT 37.7* 35.8*  MCV 86.1 88.0  PLT 228 202   Cardiac Enzymes: No results for input(s): "CKTOTAL", "CKMB", "CKMBINDEX", "TROPONINI" in the last 168 hours. BNP: Invalid input(s): "POCBNP" CBG: Recent Labs  Lab 12/04/23 0744 12/04/23 1109 12/04/23 1713  12/04/23 2032 12/05/23 0741  GLUCAP 102* 165* 97 140* 108*   D-Dimer No results for input(s): "DDIMER" in the last 72 hours. Hgb A1c No results for input(s): "HGBA1C" in the last 72 hours. Lipid Profile No results for input(s): "CHOL", "HDL", "LDLCALC", "TRIG", "CHOLHDL", "LDLDIRECT" in the last 72 hours. Thyroid function studies No results for input(s): "TSH", "T4TOTAL", "T3FREE", "THYROIDAB" in the last 72 hours.  Invalid input(s): "FREET3" Anemia work up No results for input(s): "VITAMINB12", "FOLATE", "FERRITIN", "TIBC", "IRON", "RETICCTPCT" in the last 72 hours. Urinalysis    Component Value Date/Time   COLORURINE STRAW (A) 11/30/2023 2342   APPEARANCEUR CLEAR 11/30/2023 2342   LABSPEC 1.023 11/30/2023 2342   LABSPEC 1.020 10/14/2016 1230   PHURINE 7.0 11/30/2023 2342   GLUCOSEU NEGATIVE 11/30/2023 2342   GLUCOSEU 2,000 10/14/2016 1230   HGBUR NEGATIVE 11/30/2023 2342   BILIRUBINUR NEGATIVE 11/30/2023 2342   BILIRUBINUR Negative 10/14/2016 1230   KETONESUR 5 (A) 11/30/2023 2342   PROTEINUR NEGATIVE 11/30/2023 2342   UROBILINOGEN 0.2 10/14/2016 1230   NITRITE NEGATIVE 11/30/2023 2342   LEUKOCYTESUR NEGATIVE 11/30/2023 2342   LEUKOCYTESUR Negative 10/14/2016 1230   Sepsis Labs Recent Labs  Lab 11/30/23 1916 12/01/23 0456  WBC 6.6 5.7   Microbiology Recent Results (from the past 240 hours)  Resp panel by RT-PCR (RSV, Flu A&B, Covid) Anterior Nasal Swab     Status: None   Collection Time: 11/30/23  7:19 PM   Specimen: Anterior Nasal Swab  Result Value Ref Range Status   SARS Coronavirus 2 by RT PCR NEGATIVE NEGATIVE Final    Comment: (NOTE) SARS-CoV-2 target nucleic acids are NOT DETECTED.  The SARS-CoV-2 RNA is generally detectable in upper respiratory specimens during the acute phase of infection. The lowest concentration of SARS-CoV-2 viral copies this assay can detect is 138 copies/mL. A negative result does not preclude SARS-Cov-2 infection and should  not be used as the sole basis for treatment or other patient management decisions. A negative result may occur with  improper specimen collection/handling, submission of specimen other than nasopharyngeal swab, presence of viral mutation(s) within the areas targeted by this assay, and inadequate number of viral copies(<138 copies/mL). A negative result must be combined with clinical observations, patient history, and epidemiological information. The expected result is Negative.  Fact Sheet for Patients:  BloggerCourse.com  Fact Sheet for Healthcare Providers:  SeriousBroker.it  This test is no t yet approved or cleared by the [place] FDA and  has been authorized for detection and/or diagnosis of SARS-CoV-2 by FDA under an Emergency Use Authorization (EUA). This EUA will remain  in effect (meaning this test can be used) for the duration of the COVID-19 declaration under Section 564(b)(1) of the Act, 21 U.S.C.section 360bbb-3(b)(1), unless the authorization is terminated  or revoked sooner.       Influenza A by PCR NEGATIVE NEGATIVE Final   Influenza B by PCR NEGATIVE NEGATIVE Final    Comment: (NOTE) The Xpert Xpress SARS-CoV-2/FLU/RSV plus assay is intended as an aid in the diagnosis of influenza from Nasopharyngeal swab specimens and should not be used as a sole basis for treatment. Nasal washings and aspirates are unacceptable for Xpert Xpress SARS-CoV-2/FLU/RSV testing.  Fact Sheet for Patients: BloggerCourse.com  Fact Sheet for Healthcare Providers: SeriousBroker.it  This test is not yet approved or cleared by the [place] FDA and has been authorized for detection and/or diagnosis of SARS-CoV-2 by FDA under an Emergency Use Authorization (EUA). This EUA will remain in effect (meaning this test can be used) for the duration of the COVID-19 declaration under  Section 564(b)(1) of the Act, 21 U.S.C. section 360bbb-3(b)(1), unless the authorization is terminated or revoked.     Resp Syncytial Virus by PCR NEGATIVE NEGATIVE Final    Comment: (NOTE) Fact Sheet for Patients: BloggerCourse.com  Fact Sheet for Healthcare Providers: SeriousBroker.it  This test is not yet approved or cleared by the [place] FDA and has been authorized for detection and/or diagnosis of SARS-CoV-2 by FDA under an Emergency Use Authorization (EUA). This EUA will remain in effect (meaning this test can be used) for the duration of the COVID-19 declaration under Section 564(b)(1) of the Act, 21 U.S.C. section 360bbb-3(b)(1), unless the authorization is terminated or revoked.  Performed at [facility], [address], [place], [place] [address]   Gastrointestinal Panel by PCR , Stool     Status: None   Collection Time: 12/01/23  4:06 AM   Specimen: Stool  Result Value Ref Range Status   Campylobacter species NOT DETECTED NOT DETECTED Final   Plesimonas shigelloides NOT DETECTED NOT DETECTED Final   Salmonella species NOT DETECTED NOT DETECTED Final   Yersinia enterocolitica NOT DETECTED NOT DETECTED Final   Vibrio species NOT DETECTED NOT DETECTED Final   Vibrio cholerae NOT DETECTED NOT DETECTED Final   Enteroaggregative E coli (EAEC) NOT DETECTED NOT DETECTED Final   Enteropathogenic E coli (EPEC) NOT DETECTED NOT DETECTED Final   Enterotoxigenic E coli (ETEC) NOT DETECTED NOT DETECTED Final   Shiga like toxin producing E coli (STEC) NOT DETECTED NOT DETECTED Final   Shigella/Enteroinvasive E coli (EIEC) NOT DETECTED NOT  DETECTED Final   Cryptosporidium NOT DETECTED NOT DETECTED Final   Cyclospora cayetanensis NOT DETECTED NOT DETECTED Final   Entamoeba histolytica NOT DETECTED NOT DETECTED Final   Giardia lamblia NOT DETECTED NOT DETECTED Final   Adenovirus F40/41 NOT DETECTED NOT  DETECTED Final   Astrovirus NOT DETECTED NOT DETECTED Final   Norovirus GI/GII NOT DETECTED NOT DETECTED Final   Rotavirus A NOT DETECTED NOT DETECTED Final   Sapovirus (I, II, IV, and V) NOT DETECTED NOT DETECTED Final    Comment: Performed at [facility], [address] [place], [place] [address]     Patient was seen and examined on the day of discharge and was found to be in stable condition. Time coordinating discharge: 25 minutes including assessment and coordination of care, as well as examination of the patient.   SIGNED:  Noralee Stain, DO [facility] 12/05/2023, 10:01 AM

## 2023-12-05 NOTE — TOC Transition Note (Signed)
 Transition of Care [facility]) - Discharge Note   Patient Details  Name: Michael Guzman MRN: [id number] Date of Birth: 1945-11-11  Transition of Care [facility]) CM/SW Contact:  Adrian Prows, RN Phone Number: 12/05/2023, 10:03 AM   Clinical Narrative:    D/C orders received; no TOC needs.   Final next level of care: Home/Self Care Barriers to Discharge: No Barriers Identified   Patient Goals and CMS Choice Patient states their goals for this hospitalization and ongoing recovery are:: Home CMS Medicare.gov Compare Post Acute Care list provided to:: Patient Choice offered to / list presented to : Patient [place] ownership interest in [facility].provided to:: Patient    Discharge Placement                       Discharge Plan and Services Additional resources added to the After Visit Summary for                                       Social Drivers of Health (SDOH) Interventions SDOH Screenings   Food Insecurity: No Food Insecurity (12/01/2023)  Housing: Low Risk  (12/01/2023)  Transportation Needs: No Transportation Needs (12/01/2023)  Utilities: Not At Risk (12/01/2023)  Depression (PHQ2-9): Low Risk  (11/22/2023)  Social Connections: Patient Declined (12/01/2023)  Tobacco Use: Medium Risk (11/30/2023)     Readmission Risk Interventions     No data to display

## 2023-12-06 ENCOUNTER — Ambulatory Visit: Payer: Medicare Other | Admitting: Nurse Practitioner

## 2023-12-08 ENCOUNTER — Encounter

## 2023-12-09 ENCOUNTER — Ambulatory Visit (INDEPENDENT_AMBULATORY_CARE_PROVIDER_SITE_OTHER): Admitting: Family Medicine

## 2023-12-09 ENCOUNTER — Encounter: Payer: Self-pay | Admitting: Family Medicine

## 2023-12-09 ENCOUNTER — Telehealth: Payer: Self-pay

## 2023-12-09 VITALS — BP 110/68 | HR 52 | Temp 98.1°F | Ht 70.0 in | Wt 201.6 lb

## 2023-12-09 DIAGNOSIS — Z23 Encounter for immunization: Secondary | ICD-10-CM

## 2023-12-09 DIAGNOSIS — E871 Hypo-osmolality and hyponatremia: Secondary | ICD-10-CM | POA: Diagnosis not present

## 2023-12-09 DIAGNOSIS — Z0001 Encounter for general adult medical examination with abnormal findings: Secondary | ICD-10-CM | POA: Diagnosis not present

## 2023-12-09 DIAGNOSIS — E1159 Type 2 diabetes mellitus with other circulatory complications: Secondary | ICD-10-CM

## 2023-12-09 DIAGNOSIS — Z0000 Encounter for general adult medical examination without abnormal findings: Secondary | ICD-10-CM | POA: Insufficient documentation

## 2023-12-09 NOTE — Progress Notes (Signed)
 Subjective:   Michael Guzman is a 78 y.o. male who presents for Medicare Annual/Subsequent preventive examination.  Visit Complete: In person  Patient Medicare AWV questionnaire was completed by the patient on 12/09/2023; I have confirmed that all information answered by patient is correct and no changes since this date.  Cardiac Risk Factors include: none;advanced age (>67men, >27 women);diabetes mellitus;dyslipidemia;male gender;obesity (BMI >30kg/m2);sedentary lifestyle     Objective:    Today's Vitals   12/09/23 0847 12/09/23 0902  BP: 110/68   Pulse: (!) 52   Temp: 98.1 F (36.7 C)   SpO2: 100%   Weight: 201 lb 9.6 oz (91.4 kg)   Height: 5\' 10"  (1.778 m)   PainSc:  0-No pain   Body mass index is 28.93 kg/m.     11/30/2023    9:06 PM 11/11/2023    5:25 PM 12/04/2022   11:13 AM 09/21/2022   11:12 AM 09/15/2022    2:00 PM 08/03/2022    9:48 AM 07/21/2021   11:18 AM  Advanced Directives  Does Patient Have a Medical Advance Directive? Yes Yes Yes Yes Yes Yes Yes  Type of Advance Directive Living will Living will Living will Healthcare Power of [place];Living will Healthcare Power of [place];Living will Healthcare Power of [place];Living will Healthcare Power of [place];Living will  Does patient want to make changes to medical advance directive? No - Patient declined No - Patient declined No - Patient declined  No - Guardian declined No - Patient declined No - Patient declined  Copy of Healthcare Power of Attorney in Chart?    No - copy requested No - copy requested No - copy requested No - copy requested    Current Medications (verified) Outpatient Encounter Medications as of 12/09/2023  Medication Sig   Ascorbic Acid (VITAMIN C) 1000 MG tablet Take 1,000 mg by mouth daily.   aspirin EC 81 MG tablet Take 1 tablet (81 mg total) by mouth daily.   atorvastatin (LIPITOR) 10 MG tablet TAKE 1 TABLET BY MOUTH DAILY   B Complex-C (SUPER B COMPLEX PO) Take 1 tablet by mouth  daily.   Blood Glucose Monitoring Suppl (ONE TOUCH ULTRA 2) w/Device KIT Use as directed   buPROPion (WELLBUTRIN XL) 300 MG 24 hr tablet TAKE 1 TABLET BY MOUTH DAILY FOR MOOD, FOCUS AND CONCENTRATION   Cholecalciferol (VITAMIN D3) 250 MCG (10000 UT) capsule Take 10,000 Units by mouth daily.   gabapentin (NEURONTIN) 800 MG tablet TAKE 1 TABLET BY MOUTH 3 TO 4  TIMES DAILY FOR PAIN, HOT  FLASHES AND SWEATS (Patient taking differently: Take 800 mg by mouth 2 (two) times daily. FOR PAIN, HOT FLASHES AND SWEATS)   glucose blood (ONETOUCH ULTRA) test strip CHECK BLOOD SUGAR 3 TIMES  DAILY   glycopyrrolate (ROBINUL) 2 MG tablet Take  1 tablet  3 x / day for Excessive Sweating  TAKE ONE TABLET BY                                                 MOUTH   HUMULIN 70/30 (70-30) 100 UNIT/ML injection INJECT SUBCUTANEOUSLY 50 UNITS  TWICE DAILY (Patient taking differently: Inject 30 Units into the skin daily. AILY)   Insulin Syringes, Disposable, U-100 1 ML MISC 50 Units by Does not apply route 2 (two) times daily. inject 50 units into skin 2 x /day  Lancets (ONETOUCH DELICA PLUS LANCET33G) MISC CHECK BLOOD SUGAR 3 TIMES  DAILY   Magnesium 500 MG TABS Take 500 mg by mouth daily.   metFORMIN (GLUCOPHAGE-XR) 500 MG 24 hr tablet TAKE 2 TABLETS BY MOUTH TWICE  DAILY WITH MEALS FOR DIABETES   metoprolol succinate (TOPROL-XL) 25 MG 24 hr tablet TAKE ONE-HALF TABLET BY MOUTH  DAILY   montelukast (SINGULAIR) 10 MG tablet TAKE 1 TABLET BY MOUTH DAILY FOR ALLERGIES   nitroGLYCERIN (NITROSTAT) 0.4 MG SL tablet PLACE 1 TABLET UNDER THE TONGUE EVERY 5 MINUTES AS NEEDED FOR CHEST PAIN   pantoprazole (PROTONIX) 40 MG tablet TAKE 1 TABLET BY MOUTH DAILY TO  PREVENT HEARTBURN AND  INDIGESTION   tamsulosin (FLOMAX) 0.4 MG CAPS capsule Take 0.4 mg by mouth at bedtime.   Vitamin E 450 MG (1000 UT) CAPS Take 450 mg by mouth daily.   zinc gluconate 50 MG tablet Take 50 mg by mouth daily.   No facility-administered encounter  medications on file as of 12/09/2023.    Allergies (verified) Patient has no known allergies.   History: Past Medical History:  Diagnosis Date   Arthritis    CAD (coronary artery disease)    a.s/p DES to mid LAD and OM2 06/2012.   Chronic back pain    Chronically dry eyes    Diabetes mellitus without complication (HCC)    TYPE 2    DKA (diabetic ketoacidoses) 01/30/2017   Elevated PSA    being monitored   Fatty liver    GERD (gastroesophageal reflux disease)    Hx of radiation therapy    prostate , [facility]    Hypertension    Hypertriglyceridemia    Intermediate coronary syndrome (HCC) 07/26/2012   Nausea vomiting and diarrhea 11/11/2023   Neuromuscular disorder (HCC)    neuropathy feet and legs   Prostate cancer (HCC)    RADTIATION    Past Surgical History:  Procedure Laterality Date   APPENDECTOMY     CARDIAC SURGERY  ~2017   3 stent placed.   COLONOSCOPY  02/12/2010   Buccini   COLONOSCOPY WITH PROPOFOL N/A 09/21/2022   Procedure: COLONOSCOPY WITH PROPOFOL;  Surgeon: Meridee Score Netty Starring., MD;  Location: [facility];  Service: Gastroenterology;  Laterality: N/A;   ENDOSCOPIC MUCOSAL RESECTION N/A 09/21/2022   Procedure: ENDOSCOPIC MUCOSAL RESECTION;  Surgeon: Meridee Score Netty Starring., MD;  Location: [facility];  Service: Gastroenterology;  Laterality: N/A;   HEMOSTASIS CLIP PLACEMENT  09/21/2022   Procedure: HEMOSTASIS CLIP PLACEMENT;  Surgeon: Lemar Lofty., MD;  Location: [facility];  Service: Gastroenterology;;   LEFT HEART CATHETERIZATION WITH CORONARY ANGIOGRAM N/A 06/27/2012   Procedure: LEFT HEART CATHETERIZATION WITH CORONARY ANGIOGRAM;  Surgeon: Donato Schultz, MD;  Location: [facility] CATH LAB;  Service: Cardiovascular;  Laterality: N/A;   LEFT HEART CATHETERIZATION WITH CORONARY ANGIOGRAM N/A 07/25/2012   Procedure: LEFT HEART CATHETERIZATION WITH CORONARY ANGIOGRAM;  Surgeon: Quintella Reichert, MD;  Location: [facility] CATH LAB;  Service:  Cardiovascular;  Laterality: N/A;   PROSTATE BIOPSY     RADIOLOGY WITH ANESTHESIA N/A 12/03/2015   Procedure: MRI LUMBAR SPINE;  Surgeon: Medication Radiologist, MD;  Location: [facility] OR;  Service: Radiology;  Laterality: N/A;   SUBMUCOSAL LIFTING INJECTION  09/21/2022   Procedure: SUBMUCOSAL LIFTING INJECTION;  Surgeon: Meridee Score Netty Starring., MD;  Location: [facility];  Service: Gastroenterology;;   TOTAL HIP ARTHROPLASTY Right 04/28/2019   Procedure: TOTAL HIP ARTHROPLASTY ANTERIOR APPROACH;  Surgeon: Jodi Geralds, MD;  Location: [facility];  Service: Orthopedics;  Laterality: Right;   Family History  Problem Relation Age of Onset   Diabetes Sister    Stomach cancer Neg Hx    Colon cancer Neg Hx    Esophageal cancer Neg Hx    Inflammatory bowel disease Neg Hx    Liver disease Neg Hx    Pancreatic cancer Neg Hx    Rectal cancer Neg Hx    Social History   Socioeconomic History   Marital status: Married    Spouse name: Not on file   Number of children: 4   Years of education: 2 y colleg   Highest education level: Not on file  Occupational History   Occupation: SUPERVISOR    Employer: [employer]   Occupation: retired  Tobacco Use   Smoking status: Former    Current packs/day: 0.00    Average packs/day: 0.8 packs/day for 52.9 years (39.6 ttl pk-yrs)    Types: Cigarettes    Start date: 44    Quit date: 07/27/2011    Years since quitting: 12.3   Smokeless tobacco: Never  Vaping Use   Vaping status: Never Used  Substance and Sexual Activity   Alcohol use: Not Currently    Comment: qut 20 years ago per patient    Drug use: No   Sexual activity: Not Currently  Other Topics Concern   Not on file  Social History Narrative   Works as    Social Drivers of Corporate investment banker Strain: Low Risk  (12/09/2023)   Overall Financial Resource Strain (CARDIA)    Difficulty of Paying Living Expenses: Not hard at all  Food Insecurity: No Food Insecurity (12/09/2023)    Hunger Vital Sign    Worried About Running Out of Food in the Last Year: Never true    Ran Out of Food in the Last Year: Never true  Transportation Needs: No Transportation Needs (12/09/2023)   PRAPARE - Administrator, Civil Service (Medical): No    Lack of Transportation (Non-Medical): No  Physical Activity: Sufficiently Active (12/09/2023)   Exercise Vital Sign    Days of Exercise per Week: 5 days    Minutes of Exercise per Session: 30 min  Stress: No Stress Concern Present (12/09/2023)   [employer] of Occupational Health - Occupational Stress Questionnaire    Feeling of Stress : Not at all  Social Connections: Moderately Integrated (12/09/2023)   Social Connection and Isolation Panel [NHANES]    Frequency of Communication with Friends and Family: More than three times a week    Frequency of Social Gatherings with Friends and Family: More than three times a week    Attends Religious Services: 1 to 4 times per year    Active Member of [employer] or Organizations: No    Attends Banker Meetings: 1 to 4 times per year    Marital Status: Widowed    Tobacco Counseling Counseling given: Not Answered   Clinical Intake:  Pre-visit preparation completed: Yes  Pain : No/denies pain Pain Score: 0-No pain     Nutritional Status: BMI 25 -29 Overweight Nutritional Risks: None Diabetes: Yes CBG done?: No Did pt. bring in CBG monitor from home?: No  How often do you need to have someone help you when you read instructions, pamphlets, or other written materials from your doctor or pharmacy?: 1 - Never  Interpreter Needed?: No  Information entered by :: mj perdue, lpn   Activities of Daily Living  12/09/2023    9:05 AM 12/01/2023    4:27 PM  In your present state of health, do you have any difficulty performing the following activities:  Hearing? 0 0  Vision? 0 0  Difficulty concentrating or making decisions? 0 0  Walking or climbing stairs? 0    Dressing or bathing? 0   Doing errands, shopping? 0 0  Preparing Food and eating ? N   Using the Toilet? N   In the past six months, have you accidently leaked urine? N   Do you have problems with loss of bowel control? N   Managing your Medications? N   Managing your Finances? N   Housekeeping or managing your Housekeeping? N     Patient Care Team: Park Meo, [name] as PCP - General (Family Medicine) Jake Bathe, MD as PCP - Cardiology (Cardiology) Sundra Aland, [facility] (Inactive) as Pharmacist (Pharmacist)  Indicate any recent Medical Services you may have received from other than [facility] providers in the past year (date may be approximate).     Assessment:   This is a routine wellness examination for [place].  Hearing/Vision screen Hearing Screening - Comments:: No hearing issues.  Vision Screening - Comments:: Read[facility]. [facility].    Goals Addressed             This Visit's Progress    Exercise 150 min/wk Moderate Activity       Pt walks outside every day.        Depression Screen    12/09/2023    9:07 AM 11/22/2023    4:50 PM 05/09/2023    1:14 AM 12/04/2022   11:14 AM 08/03/2022    9:43 AM 10/22/2021    8:33 PM 07/21/2021   11:19 AM  PHQ 2/9 Scores  PHQ - 2 Score 0 0 0 1 0 2 0    Fall Risk    11/22/2023    4:07 PM 05/09/2023    1:14 AM 12/04/2022   11:14 AM 08/03/2022    9:48 AM 10/22/2021    8:32 PM  Fall Risk   Falls in the past year? 0 0 0 0 0  Number falls in past yr: 0  0 0   Injury with Fall? 0  0 0   Risk for fall due to : No Fall Risks No Fall Risks Impaired balance/gait  No Fall Risks  Follow up Falls prevention discussed;Falls evaluation completed Falls evaluation completed;Education provided;Falls prevention discussed Falls evaluation completed;Falls prevention discussed Falls evaluation completed;Falls prevention discussed Falls evaluation completed;Education provided;Falls prevention discussed    MEDICARE RISK AT HOME:     TIMED UP AND GO:  Was the test performed?  Yes  Length of time to ambulate 10 feet: 8 sec Gait steady and fast without use of assistive device    Cognitive Function:    07/18/2020   12:19 PM  MMSE - Mini Mental State Exam  Orientation to time 5  Orientation to Place 5  Registration 3  Attention/ Calculation 5  Recall 3  Language- name 2 objects 2  Language- repeat 1  Language- follow 3 step command 3  Language- read & follow direction 1  Write a sentence 1  Copy design 1  Total score 30        12/09/2023    9:10 AM  6CIT Screen  What Year? 0 points  What month? 0 points  What time? 0 points  Count back from 20 0 points  Months in reverse 2 points  Repeat phrase 0 points  Total Score 2 points    Immunizations Immunization History  Administered Date(s) Administered   Influenza Split 06/26/2012   Influenza, High Dose Seasonal PF 06/20/2015, 06/23/2016, 06/14/2017, 06/14/2019, 07/01/2020, 07/21/2021, 08/03/2022, 08/17/2023   Influenza-Unspecified 06/14/2013, 06/25/2014, 06/03/2018   PFIZER(Purple Top)SARS-COV-2 Vaccination 10/28/2019, 11/22/2019, 06/26/2020   Pneumococcal Conjugate-13 04/23/2016   Pneumococcal Polysaccharide-23 06/14/2017   Pneumococcal-Unspecified 06/16/2010   Td 09/17/2003   Tdap 11/26/2013   Zoster, Live 07/12/2012    TDAP status: Due, Education has been provided regarding the importance of this vaccine. Advised may receive this vaccine at local pharmacy or Health Dept. Aware to provide a copy of the vaccination record if obtained from local pharmacy or Health Dept. Verbalized acceptance and understanding.  Flu Vaccine status: Up to date  Pneumococcal vaccine status: Completed during today's visit.  Covid-19 vaccine status: Completed vaccines  Qualifies for Shingles Vaccine? Yes   Zostavax completed Yes   Shingrix Completed?: Yes  Screening Tests Health Maintenance  Topic Date Due   COVID-19 Vaccine (4 - 2024-25 season) 05/16/2023    OPHTHALMOLOGY EXAM  06/24/2023   DTaP/Tdap/Td (3 - Td or Tdap) 11/27/2023   Zoster Vaccines- Shingrix (1 of 2) 02/22/2024 (Originally 06/02/1965)   Diabetic kidney evaluation - Urine ACR  05/05/2024   HEMOGLOBIN A1C  05/24/2024   FOOT EXAM  08/16/2024   Diabetic kidney evaluation - eGFR measurement  12/04/2024   Medicare Annual Wellness (AWV)  12/08/2024   Pneumonia Vaccine 29+ Years old  Completed   INFLUENZA VACCINE  Completed   Hepatitis C Screening  Completed   HPV VACCINES  Aged Out   Lung Cancer Screening  Discontinued   Colonoscopy  Discontinued    Health Maintenance  Health Maintenance Due  Topic Date Due   COVID-19 Vaccine (4 - 2024-25 season) 05/16/2023   OPHTHALMOLOGY EXAM  06/24/2023   DTaP/Tdap/Td (3 - Td or Tdap) 11/27/2023    Colorectal cancer screening: No longer required.   Lung Cancer Screening: (Low Dose CT Chest recommended if Age 33-80 years, 20 pack-year currently smoking OR have quit w/in 15years.) does not qualify.   Lung Cancer Screening Referral: n/A  Additional Screening:  Hepatitis C Screening: does qualify; Completed 07/24/2014  Vision Screening: Recommended annual ophthalmology exams for early detection of glaucoma and other disorders of the eye. Is the patient up to date with their annual eye exam?  Yes  Who is the provider or what is the name of the office in which the patient attends annual eye exams? [facility]  If pt is not established with a provider, would they like to be referred to a provider to establish care? No .   Dental Screening: Recommended annual dental exams for proper oral hygiene  Diabetic Foot Exam: Diabetic Foot Exam: Completed 08/16/2024  Community Resource Referral / Chronic Care Management: CRR required this visit?  No   CCM required this visit?  No     Plan:     I have personally reviewed and noted the following in the patient's chart:   Medical and social history Use of alcohol, tobacco  or illicit drugs  Current medications and supplements including opioid prescriptions. Patient is not currently taking opioid prescriptions. Functional ability and status Nutritional status Physical activity Advanced directives List of other physicians Hospitalizations, surgeries, and ER visits in previous 12 months Vitals Screenings to include cognitive, depression, and falls Referrals and appointments  In addition, I have reviewed  and discussed with patient certain preventive protocols, quality metrics, and best practice recommendations. A written personalized care plan for preventive services as well as general preventive health recommendations were provided to patient.     Park Meo, [name]   12/09/2023   After Visit Summary: (In Person-Printed) AVS printed and given to the patient  Nurse Notes: Discussed tdap

## 2023-12-09 NOTE — Assessment & Plan Note (Signed)
 Is pushing fluids but still drinking some free water. Encouraged to drink more electrolyte fluids. No nausea, vomiting, or diarrhea, swelling of extremities, dyspnea with exertion, or chest pain. He looks fatigued today. BMP today. Discussed when to seek medical care.

## 2023-12-09 NOTE — Progress Notes (Signed)
 Subjective:  HPI: Michael Guzman is a 78 y.o. male presenting on 12/09/2023 for Medicare Wellness   HPI Patient is in today for AWV and hospital follow-up. He was hospitalized from 3/18-3/23 for nausea, vomiting, diarrhea with hyponatremia, Na 116. He was treated with IV fluids, fluid restricted diet, and salt tablets. He was hospitalized for similar in February and SIADH ruled out, nephrology was consulted. Na after hospitalization was 136. Returned to ED 8 days later with recurrence of symptoms and NA 116, improved to 129 by discharge. Since his hospitalization he reports he has had no further nausea or vomiting, diarrhea, AMS.    Hospital FOLLOW UP Time since discharge: 4 days Hospital/facility: [facility] Diagnosis: hyponatremia Procedures/tests: Resp viral panel, GI panel, BMP, urine osmolality and NA, UA Consultants: nephrology New medications: DC meloxicam Discharge instructions:  Status: better    Review of Systems  All other systems reviewed and are negative.   Relevant past medical history reviewed and updated as indicated.   Past Medical History:  Diagnosis Date   Arthritis    CAD (coronary artery disease)    a.s/p DES to mid LAD and OM2 06/2012.   Chronic back pain    Chronically dry eyes    Diabetes mellitus without complication (HCC)    TYPE 2    DKA (diabetic ketoacidoses) 01/30/2017   Elevated PSA    being monitored   Fatty liver    GERD (gastroesophageal reflux disease)    Hx of radiation therapy    prostate , [facility]    Hypertension    Hypertriglyceridemia    Intermediate coronary syndrome (HCC) 07/26/2012   Nausea vomiting and diarrhea 11/11/2023   Neuromuscular disorder (HCC)    neuropathy feet and legs   Prostate cancer (HCC)    RADTIATION      Past Surgical History:  Procedure Laterality Date   APPENDECTOMY     CARDIAC SURGERY  ~2017   3 stent placed.   COLONOSCOPY  02/12/2010   Buccini   COLONOSCOPY  WITH PROPOFOL N/A 09/21/2022   Procedure: COLONOSCOPY WITH PROPOFOL;  Surgeon: Meridee Score Netty Starring., MD;  Location: [facility];  Service: Gastroenterology;  Laterality: N/A;   ENDOSCOPIC MUCOSAL RESECTION N/A 09/21/2022   Procedure: ENDOSCOPIC MUCOSAL RESECTION;  Surgeon: Meridee Score Netty Starring., MD;  Location: [facility];  Service: Gastroenterology;  Laterality: N/A;   HEMOSTASIS CLIP PLACEMENT  09/21/2022   Procedure: HEMOSTASIS CLIP PLACEMENT;  Surgeon: Lemar Lofty., MD;  Location: [facility];  Service: Gastroenterology;;   LEFT HEART CATHETERIZATION WITH CORONARY ANGIOGRAM N/A 06/27/2012   Procedure: LEFT HEART CATHETERIZATION WITH CORONARY ANGIOGRAM;  Surgeon: Donato Schultz, MD;  Location: [facility] CATH LAB;  Service: Cardiovascular;  Laterality: N/A;   LEFT HEART CATHETERIZATION WITH CORONARY ANGIOGRAM N/A 07/25/2012   Procedure: LEFT HEART CATHETERIZATION WITH CORONARY ANGIOGRAM;  Surgeon: Quintella Reichert, MD;  Location: [facility] CATH LAB;  Service: Cardiovascular;  Laterality: N/A;   PROSTATE BIOPSY     RADIOLOGY WITH ANESTHESIA N/A 12/03/2015   Procedure: MRI LUMBAR SPINE;  Surgeon: Medication Radiologist, MD;  Location: [facility] OR;  Service: Radiology;  Laterality: N/A;   SUBMUCOSAL LIFTING INJECTION  09/21/2022   Procedure: SUBMUCOSAL LIFTING INJECTION;  Surgeon: Meridee Score Netty Starring., MD;  Location: [facility];  Service: Gastroenterology;;   TOTAL HIP ARTHROPLASTY Right 04/28/2019   Procedure: TOTAL HIP ARTHROPLASTY ANTERIOR APPROACH;  Surgeon: Jodi Geralds, MD;  Location: [facility];  Service: Orthopedics;  Laterality: Right;    Allergies and medications reviewed  and updated.   Current Outpatient Medications:    Ascorbic Acid (VITAMIN C) 1000 MG tablet, Take 1,000 mg by mouth daily., Disp: , Rfl:    aspirin EC 81 MG tablet, Take 1 tablet (81 mg total) by mouth daily., Disp: 90 tablet, Rfl: 3   atorvastatin (LIPITOR) 10 MG tablet, TAKE 1 TABLET BY MOUTH DAILY, Disp: 100 tablet, Rfl: 2   B  Complex-C (SUPER B COMPLEX PO), Take 1 tablet by mouth daily., Disp: , Rfl:    Blood Glucose Monitoring Suppl (ONE TOUCH ULTRA 2) w/Device KIT, Use as directed, Disp: 1 kit, Rfl: 0   buPROPion (WELLBUTRIN XL) 300 MG 24 hr tablet, TAKE 1 TABLET BY MOUTH DAILY FOR MOOD, FOCUS AND CONCENTRATION, Disp: 90 tablet, Rfl: 3   Cholecalciferol (VITAMIN D3) 250 MCG (10000 UT) capsule, Take 10,000 Units by mouth daily., Disp: , Rfl:    gabapentin (NEURONTIN) 800 MG tablet, TAKE 1 TABLET BY MOUTH 3 TO 4  TIMES DAILY FOR PAIN, HOT  FLASHES AND SWEATS (Patient taking differently: Take 800 mg by mouth 2 (two) times daily. FOR PAIN, HOT FLASHES AND SWEATS), Disp: 400 tablet, Rfl: 2   glucose blood (ONETOUCH ULTRA) test strip, CHECK BLOOD SUGAR 3 TIMES  DAILY, Disp: 300 strip, Rfl: 3   glycopyrrolate (ROBINUL) 2 MG tablet, Take  1 tablet  3 x / day for Excessive Sweating  TAKE ONE TABLET BY                                                 MOUTH, Disp: 270 tablet, Rfl: 3   HUMULIN 70/30 (70-30) 100 UNIT/ML injection, INJECT SUBCUTANEOUSLY 50 UNITS  TWICE DAILY (Patient taking differently: Inject 30 Units into the skin daily. AILY), Disp: 100 mL, Rfl: 2   Insulin Syringes, Disposable, U-100 1 ML MISC, 50 Units by Does not apply route 2 (two) times daily. inject 50 units into skin 2 x /day, Disp: 200 each, Rfl: 3   Lancets (ONETOUCH DELICA PLUS LANCET33G) MISC, CHECK BLOOD SUGAR 3 TIMES  DAILY, Disp: 300 each, Rfl: 3   Magnesium 500 MG TABS, Take 500 mg by mouth daily., Disp: , Rfl:    metFORMIN (GLUCOPHAGE-XR) 500 MG 24 hr tablet, TAKE 2 TABLETS BY MOUTH TWICE  DAILY WITH MEALS FOR DIABETES, Disp: 400 tablet, Rfl: 2   metoprolol succinate (TOPROL-XL) 25 MG 24 hr tablet, TAKE ONE-HALF TABLET BY MOUTH  DAILY, Disp: 45 tablet, Rfl: 3   montelukast (SINGULAIR) 10 MG tablet, TAKE 1 TABLET BY MOUTH DAILY FOR ALLERGIES, Disp: 100 tablet, Rfl: 2   nitroGLYCERIN (NITROSTAT) 0.4 MG SL tablet, PLACE 1 TABLET UNDER THE TONGUE EVERY 5  MINUTES AS NEEDED FOR CHEST PAIN, Disp: 25 tablet, Rfl: 6   pantoprazole (PROTONIX) 40 MG tablet, TAKE 1 TABLET BY MOUTH DAILY TO  PREVENT HEARTBURN AND  INDIGESTION, Disp: 100 tablet, Rfl: 2   tamsulosin (FLOMAX) 0.4 MG CAPS capsule, Take 0.4 mg by mouth at bedtime., Disp: , Rfl:    Vitamin E 450 MG (1000 UT) CAPS, Take 450 mg by mouth daily., Disp: , Rfl:    zinc gluconate 50 MG tablet, Take 50 mg by mouth daily., Disp: , Rfl:   No Known Allergies  Objective:   BP 110/68   Pulse (!) 52   Temp 98.1 F (36.7 C)   Ht 5\' 10"  (  1.778 m)   Wt 201 lb 9.6 oz (91.4 kg)   SpO2 100%   BMI 28.93 kg/m      12/09/2023    8:47 AM 12/05/2023    5:00 AM 12/05/2023    4:24 AM  Vitals with BMI  Height 5\' 10"     Weight 201 lbs 10 oz 197 lbs 5 oz   BMI 28.93 28.31   Systolic 110  139  Diastolic 68  71  Pulse 52  56     Physical Exam Vitals and nursing note reviewed.  Constitutional:      Appearance: Normal appearance. He is overweight.  HENT:     Head: Normocephalic and atraumatic.  Cardiovascular:     Rate and Rhythm: Regular rhythm. Bradycardia present.     Pulses: Normal pulses.     Heart sounds: Normal heart sounds.  Pulmonary:     Effort: Pulmonary effort is normal.     Breath sounds: Normal breath sounds.  Skin:    General: Skin is warm and dry.     Capillary Refill: Capillary refill takes less than 2 seconds.  Neurological:     General: No focal deficit present.     Mental Status: He is alert and oriented to person, place, and time. Mental status is at baseline.  Psychiatric:        Mood and Affect: Mood normal.        Behavior: Behavior normal.        Thought Content: Thought content normal.        Judgment: Judgment normal.     Assessment & Plan:  Encounter for Medicare annual wellness exam  Hyponatremia Assessment & Plan: Is pushing fluids but still drinking some free water. Encouraged to drink more electrolyte fluids. No nausea, vomiting, or diarrhea, swelling of  extremities, dyspnea with exertion, or chest pain. He looks fatigued today. BMP today. Discussed when to seek medical care.   Orders: -     Basic Metabolic Panel Without GFR -     Ambulatory referral to Nephrology  Immunization due -     Tdap vaccine greater than or equal to 7yo IM     Follow up plan: Return in 1 year (on 12/08/2024).  Park Meo, [name]

## 2023-12-10 ENCOUNTER — Telehealth: Payer: Self-pay | Admitting: *Deleted

## 2023-12-10 LAB — BASIC METABOLIC PANEL WITHOUT GFR
BUN: 9 mg/dL (ref 7–25)
CO2: 28 mmol/L (ref 20–32)
Calcium: 9.7 mg/dL (ref 8.6–10.3)
Chloride: 96 mmol/L — ABNORMAL LOW (ref 98–110)
Creat: 0.98 mg/dL (ref 0.70–1.28)
Glucose, Bld: 156 mg/dL — ABNORMAL HIGH (ref 65–99)
Potassium: 4.9 mmol/L (ref 3.5–5.3)
Sodium: 133 mmol/L — ABNORMAL LOW (ref 135–146)

## 2023-12-10 NOTE — Progress Notes (Signed)
 Complex Care Management Note  Care Guide Note 12/10/2023 Name: [name] MRN: [id number] DOB: 1946-04-23  Michael Guzman is a 78 y.o. year old male who sees Park Meo, [name] for primary care. I reached out to Michael Guzman by phone today to offer complex care management services.  Mr. Teters was given information about Complex Care Management services today including:   The Complex Care Management services include support from the care team which includes your Nurse Care Manager, Clinical Social Worker, or Pharmacist.  The Complex Care Management team is here to help remove barriers to the health concerns and goals most important to you. Complex Care Management services are voluntary, and the patient may decline or stop services at any time by request to their care team member.   Complex Care Management Consent Status: Patient agreed to services and verbal consent obtained.   Follow up plan:  Telephone appointment with complex care management team member scheduled for:  4/2  Encounter Outcome:  Patient Scheduled  Gwenevere Ghazi  [facility]  [facility] Guide  Direct Dial: [phone]  Fax [phone]

## 2023-12-13 ENCOUNTER — Telehealth: Payer: Self-pay | Admitting: Family Medicine

## 2023-12-13 NOTE — Telephone Encounter (Signed)
 Received notice from [facility] to request confirmation of upcoming appointment for patient to see Dr. Wolfgang Phoenix on 12/17/23 at 1:40pm. See media for copy of notice from Dr. Lucio Edward office stating they had been unable to contact the patient.   Spoke with patient directly who confirmed. Relayed message to [facility] at Dr. Lucio Edward office.

## 2023-12-14 ENCOUNTER — Other Ambulatory Visit: Payer: Self-pay

## 2023-12-14 ENCOUNTER — Ambulatory Visit: Payer: Self-pay

## 2023-12-17 ENCOUNTER — Other Ambulatory Visit (HOSPITAL_COMMUNITY): Payer: Self-pay | Admitting: Nephrology

## 2023-12-17 ENCOUNTER — Other Ambulatory Visit: Payer: Self-pay

## 2023-12-17 DIAGNOSIS — R809 Proteinuria, unspecified: Secondary | ICD-10-CM

## 2023-12-17 DIAGNOSIS — N182 Chronic kidney disease, stage 2 (mild): Secondary | ICD-10-CM

## 2023-12-17 DIAGNOSIS — E1122 Type 2 diabetes mellitus with diabetic chronic kidney disease: Secondary | ICD-10-CM

## 2023-12-17 NOTE — Patient Outreach (Signed)
  Chronic Care Management   CCM RN Visit Note  12/17/2023 Name: Michael Guzman MRN: [id number] DOB: Dec 27, 1945  Subjective: Michael Guzman is a 78 y.o. year old male who is a primary care patient of Park Meo, [name]. The patient was referred to the Chronic Care Management team for assistance with care management needs subsequent to provider initiation of CCM services and plan of care.    Today's Visit:  Engaged with patient by telephone for initial visit.     SDOH Interventions Today    Flowsheet Row Most Recent Value  SDOH Interventions   Food Insecurity Interventions Intervention Not Indicated  Utilities Interventions Intervention Not Indicated         Goals Addressed             This Visit's Progress    CCM Expected Outcome:  Monitor, Self-Manage and Reduce Symptoms of Diabetes       Current Barriers:  Knowledge Deficits related to plan of care for management of DMII   RNCM Clinical Goal(s):  Over the next 60 days, Patient will verbalize understanding of plan for management of DMII as evidenced by decrease in A1C to less than 8,  through collaboration with RN Care manager, provider, and care team .   Interventions: Evaluation of current treatment plan related to  self management and patient's adherence to plan as established by provider   Diabetes Interventions:  (Status:  New goal.) Long Term Goal Assessed patient's understanding of A1c goal: <8% Provided education to patient about basic DM disease process Reviewed medications with patient and discussed importance of medication adherence Provided patient with written educational materials related to hypo and hyperglycemia and importance of correct treatment Advised patient, providing education and rationale, to check cbg at least twice a day and record, calling PCP for findings outside established parameters Assessed social determinant of health barriers Lab Results  Component Value Date   HGBA1C 8.2 (H)  11/22/2023    Patient Goals/Self-Care Activities: Take all medications as prescribed Call provider office for new concerns or questions  enter blood sugar readings and medication or insulin into daily log drink 6 to 8 glasses of water each day fill half of plate with vegetables Take fasting blood sugars in the morning, take blood sugar 2 hours post meal (goal of blood sugar less than 180mg /dL 2 hr post meal).    Follow Up Plan:  Telephone follow up appointment with care management team member scheduled for:  Friday, April 18 at 10:00Am          RECOMMENDATIONS:  -He is attending [facility] appointment today at 1:40pm.   -Cardio Stress Test on 01/05/24.   -He will monitor and log fasting blood sugars.    Plan:Telephone follow up appointment with care management team member scheduled for:  Friday, April 18 at 10:00am.  The patient has been provided with contact information for the care management team and has been advised to call with any health related questions or concerns.   Jeani Hawking BSN, CCM [place]  [facility] Population Health RN Care Manager Direct Dial: [phone]  Fax: [phone]

## 2023-12-17 NOTE — Patient Instructions (Signed)
 Visit Information  Thank you for taking time to visit with me today. Please don't hesitate to contact me if I can be of assistance to you before our next scheduled appointment.  Our next appointment is by telephone on Friday, April 18 at 10:00am Please call the care guide team at [phone] if you need to cancel or reschedule your appointment.   Please see attached handout of Diabetes and Nutrition - it's a great reminder on how to plan your meal and fix your plate portions.     Following is a copy of your care plan:   Goals Addressed             This Visit's Progress    CCM Expected Outcome:  Monitor, Self-Manage and Reduce Symptoms of Diabetes       Current Barriers:  Knowledge Deficits related to plan of care for management of DMII   RNCM Clinical Goal(s):  Over the next 60 days, Patient will verbalize understanding of plan for management of DMII as evidenced by decrease in A1C to less than 8,  through collaboration with RN Care manager, provider, and care team .   Interventions: Evaluation of current treatment plan related to  self management and patient's adherence to plan as established by provider   Diabetes Interventions:  (Status:  New goal.) Long Term Goal Assessed patient's understanding of A1c goal: <8% Provided education to patient about basic DM disease process Reviewed medications with patient and discussed importance of medication adherence Provided patient with written educational materials related to hypo and hyperglycemia and importance of correct treatment Advised patient, providing education and rationale, to check cbg at least twice a day and record, calling PCP for findings outside established parameters Assessed social determinant of health barriers Lab Results  Component Value Date   HGBA1C 8.2 (H) 11/22/2023    Patient Goals/Self-Care Activities: Take all medications as prescribed Call provider office for new concerns or questions  enter blood  sugar readings and medication or insulin into daily log drink 6 to 8 glasses of water each day fill half of plate with vegetables Take fasting blood sugars in the morning, take blood sugar 2 hours post meal (goal of blood sugar less than 180mg /dL 2 hr post meal).    Follow Up Plan:  Telephone follow up appointment with care management team member scheduled for:  Friday, April 18 at 10:00Am          Please call the Suicide and Crisis Lifeline: 988 if you are experiencing a Mental Health or Behavioral Health Crisis or need someone to talk to.  The patient verbalized understanding of instructions, educational materials, and care plan provided today and agreed to receive a mailed copy of patient instructions, educational materials, and care plan.   Jeani Hawking BSN, CCM [place]  VBC[employer] Population Health RN Care Manager Direct Dial: [phone]  Fax: [phone]

## 2023-12-20 ENCOUNTER — Ambulatory Visit: Payer: Medicare Other | Admitting: Family Medicine

## 2023-12-31 ENCOUNTER — Other Ambulatory Visit: Payer: Self-pay

## 2024-01-04 ENCOUNTER — Telehealth (HOSPITAL_COMMUNITY): Payer: Self-pay | Admitting: *Deleted

## 2024-01-04 NOTE — Telephone Encounter (Signed)
 Attempted to call patient regarding upcoming cardiac PET appointment. Voicemail full.  Chase Copping RN Navigator Cardiac Imaging [facility] [phone] Office [phone] Cell

## 2024-01-05 ENCOUNTER — Ambulatory Visit (HOSPITAL_COMMUNITY)
Admission: RE | Admit: 2024-01-05 | Discharge: 2024-01-05 | Disposition: A | Discharge status: Home / Self Care | Payer: Medicare Other | Source: Ambulatory Visit | Attending: Cardiology | Admitting: Cardiology

## 2024-01-05 DIAGNOSIS — R072 Precordial pain: Secondary | ICD-10-CM | POA: Diagnosis not present

## 2024-01-05 DIAGNOSIS — I2510 Atherosclerotic heart disease of native coronary artery without angina pectoris: Secondary | ICD-10-CM | POA: Insufficient documentation

## 2024-01-05 DIAGNOSIS — Z9861 Coronary angioplasty status: Secondary | ICD-10-CM | POA: Insufficient documentation

## 2024-01-05 LAB — NM PET CT CARDIAC PERFUSION MULTI W/ABSOLUTE BLOODFLOW
LV dias vol: 70 mL (ref 62–150)
MBFR: 2.41
Nuc Rest EF: 53 %
Nuc Stress EF: 59 %
Peak HR: 81 {beats}/min
Rest HR: 64 {beats}/min
Rest MBF: 0.68 ml/g/min
Rest Nuclear Isotope Dose: 23.8 mCi
Rest perfusion cavity size (mL): 70 mL
ST Depression (mm): 0 mm
Stress MBF: 1.64 ml/g/min
Stress Nuclear Isotope Dose: 23.8 mCi
Stress perfusion cavity size (mL): 81 mL
TID: 1.1

## 2024-01-05 MED ORDER — RUBIDIUM RB82 GENERATOR (RUBYFILL)
23.7600 | PACK | Freq: Once | INTRAVENOUS | Status: AC
  Administered 2024-01-05: 23.76 via INTRAVENOUS

## 2024-01-05 MED ORDER — RUBIDIUM RB82 GENERATOR (RUBYFILL)
23.8000 | PACK | Freq: Once | INTRAVENOUS | Status: AC
  Administered 2024-01-05: 23.8 via INTRAVENOUS

## 2024-01-05 MED ORDER — REGADENOSON 0.4 MG/5ML IV SOLN
INTRAVENOUS | Status: AC
  Filled 2024-01-05: qty 5

## 2024-01-05 MED ORDER — REGADENOSON 0.4 MG/5ML IV SOLN
0.4000 mg | Freq: Once | INTRAVENOUS | Status: AC
  Administered 2024-01-05: 0.4 mg via INTRAVENOUS

## 2024-01-11 ENCOUNTER — Encounter: Payer: Self-pay | Admitting: Cardiology

## 2024-01-12 ENCOUNTER — Other Ambulatory Visit: Payer: Self-pay | Admitting: Family Medicine

## 2024-01-12 MED ORDER — ISOSORBIDE MONONITRATE ER 30 MG PO TB24: 30.0000 mg | ORAL_TABLET | Freq: Every day | ORAL | 0 refills | Status: DC

## 2024-01-13 ENCOUNTER — Other Ambulatory Visit: Payer: Self-pay | Admitting: Family Medicine

## 2024-01-13 DIAGNOSIS — R61 Generalized hyperhidrosis: Secondary | ICD-10-CM

## 2024-01-13 DIAGNOSIS — M150 Primary generalized (osteo)arthritis: Secondary | ICD-10-CM

## 2024-01-13 DIAGNOSIS — E119 Type 2 diabetes mellitus without complications: Secondary | ICD-10-CM

## 2024-01-13 NOTE — Telephone Encounter (Signed)
 Prescription Request  01/13/2024  LOV: 11/22/2023  What is the name of the medication or equipment? Humulin , t/s one touch ultra, gabapentin  tab  Have you contacted your pharmacy to request a refill? Yes   Which pharmacy would you like this sent to?  [facility] - [place], [place] - [address] [place] [address] Phone: [phone] Fax: [phone]    Patient notified that their request is being sent to the clinical staff for review and that they should receive a response within 2 business days.   Please advise at [facility] [phone]

## 2024-01-13 NOTE — Telephone Encounter (Signed)
 Prescription Request  01/13/2024  LOV: 11/22/2023  What is the name of the medication or equipment? pantoprazole  (PROTONIX ) 40 MG tablet and montelukast  (SINGULAIR ) 10 MG tablet   Have you contacted your pharmacy to request a refill? Yes   Which pharmacy would you like this sent to?  [facility] - [place], [place] - [address] [place] [address] Phone: [phone] Fax: [phone]    Patient notified that their request is being sent to the clinical staff for review and that they should receive a response within 2 business days.   Please advise at [facility] [phone]

## 2024-01-14 ENCOUNTER — Other Ambulatory Visit: Payer: Self-pay | Admitting: Family Medicine

## 2024-01-14 DIAGNOSIS — I2510 Atherosclerotic heart disease of native coronary artery without angina pectoris: Secondary | ICD-10-CM

## 2024-01-14 NOTE — Telephone Encounter (Unsigned)
 Copied from CRM [id number]. Topic: Clinical - Medication Refill >> Jan 14, 2024  3:47 PM Lotus Round B wrote: Most Recent Primary Care Visit:  Provider: Yolanda Hence S  Department[facility]  Visit Type: MEDICARE AWV, SEQUENTIAL  Date: 12/09/2023  Medication: nitroGLYCERIN  (NITROSTAT ) 0.4 MG SL tablet  Has the patient contacted their pharmacy? Yes (Agent: If no, request that the patient contact the pharmacy for the refill. If patient does not wish to contact the pharmacy document the reason why and proceed with request.) (Agent: If yes, when and what did the pharmacy advise?)  Is this the correct pharmacy for this prescription? Yes If no, delete pharmacy and type the correct one.  This is the patient's preferred pharmacy:  [facility] - [place], [place] - [address] [place]  [address] Phone: [phone] Fax: [phone]   Has the prescription been filled recently? No  Is the patient out of the medication? Yes  Has the patient been seen for an appointment in the last year OR does the patient have an upcoming appointment? Yes  Can we respond through MyChart? No  Agent: Please be advised that Rx refills may take up to 3 business days. We ask that you follow-up with your pharmacy.

## 2024-01-17 ENCOUNTER — Other Ambulatory Visit: Payer: Self-pay | Admitting: Family Medicine

## 2024-01-17 DIAGNOSIS — R61 Generalized hyperhidrosis: Secondary | ICD-10-CM

## 2024-01-17 DIAGNOSIS — M150 Primary generalized (osteo)arthritis: Secondary | ICD-10-CM

## 2024-01-17 DIAGNOSIS — E119 Type 2 diabetes mellitus without complications: Secondary | ICD-10-CM

## 2024-01-17 MED ORDER — ONETOUCH ULTRA VI STRP: ORAL_STRIP | 3 refills | Status: DC

## 2024-01-17 MED ORDER — GABAPENTIN 800 MG PO TABS: ORAL_TABLET | ORAL | 2 refills | Status: DC

## 2024-01-17 MED ORDER — HUMULIN 70/30 (70-30) 100 UNIT/ML ~~LOC~~ SUSP: SUBCUTANEOUS | 2 refills | Status: DC

## 2024-01-17 NOTE — Telephone Encounter (Signed)
 Prescription Request  01/17/2024  LOV: 11/22/2023  What is the name of the medication or equipment? HUMULIN  70/30 (70-30) 100 UNIT/ML injection   Have you contacted your pharmacy to request a refill? Yes   Which pharmacy would you like this sent to?  [facility] - [place], [place] - [address] [place] [address] Phone: [phone] Fax: [phone]    Patient notified that their request is being sent to the clinical staff for review and that they should receive a response within 2 business days.   Please advise at [facility] [phone]

## 2024-01-17 NOTE — Telephone Encounter (Signed)
 Prescription Request  01/17/2024  LOV: 11/22/2023  What is the name of the medication or equipment? gabapentin  (NEURONTIN ) 800 MG tablet and t/s one touch ultra  Have you contacted your pharmacy to request a refill? Yes   Which pharmacy would you like this sent to?  [facility] - [place], [place] - [address] [place] [address] Phone: [phone] Fax: [phone]    Patient notified that their request is being sent to the clinical staff for review and that they should receive a response within 2 business days.   Please advise at [facility] [phone]

## 2024-01-17 NOTE — Telephone Encounter (Signed)
 Requested medication (s) are due for refill today: routing for review  Requested medication (s) are on the active medication list: yes  Last refill:  multiple dates  Future visit scheduled: no  Notes to clinic:  Unable to refill per protocol, last refill by another provider not at this practice.     Requested Prescriptions  Pending Prescriptions Disp Refills   insulin  NPH-regular Human (HUMULIN  70/30) (70-30) 100 UNIT/ML injection 100 mL 2    Sig: INJECT SUBCUTANEOUSLY 50  UNITS TWICE DAILY     Endocrinology:  Diabetes - Insulins Failed - 01/17/2024 11:23 AM      Failed - HBA1C is between 0 and 7.9 and within 180 days    Hgb A1c MFr Bld  Date Value Ref Range Status  11/22/2023 8.2 (H) <5.7 % of total Hgb Final    Comment:    For someone without known diabetes, a hemoglobin A1c value of 6.5% or greater indicates that they may have  diabetes and this should be confirmed with a follow-up  test. . For someone with known diabetes, a value <7% indicates  that their diabetes is well controlled and a value  greater than or equal to 7% indicates suboptimal  control. A1c targets should be individualized based on  duration of diabetes, age, comorbid conditions, and  other considerations. . Currently, no consensus exists regarding use of hemoglobin A1c for diagnosis of diabetes for children. Temple Feeler - Valid encounter within last 6 months    Recent Outpatient Visits           1 month ago Hyponatremia   [place] [facility] Jenelle Mis, [name]       Future Appointments             In 1 month Vangie Genet, MD [place] [facility]   In 4 months Vangie Genet, MD [place] [facility]             gabapentin  (NEURONTIN ) 800 MG tablet 400 tablet 2    Sig: TAKE 1 TABLET BY MOUTH 3 TO 4 TIMES DAILY FOR PAIN, HOT FLASHES AND SWEATS     Neurology: Anticonvulsants - gabapentin  Passed -  01/17/2024 11:23 AM      Passed - Cr in normal range and within 360 days    Creat  Date Value Ref Range Status  12/09/2023 0.98 0.70 - 1.28 mg/dL Final   Creatinine, Urine  Date Value Ref Range Status  05/06/2023 129 20 - 320 mg/dL Final         Passed - Completed PHQ-2 or PHQ-9 in the last 360 days      Passed - Valid encounter within last 12 months    Recent Outpatient Visits           1 month ago Hyponatremia   [place] [facility] Jenelle Mis, [name]       Future Appointments             In 1 month Vangie Genet, MD [place] [facility]   In 4 months Vangie Genet, MD [place] [facility]             glucose blood (ONETOUCH ULTRA) test strip 300 strip 3    Sig: CHECK BLOOD SUGAR 3 TIMES  DAILY     Endocrinology: Diabetes - Testing Supplies Passed - 01/17/2024 11:23 AM  Passed - Valid encounter within last 12 months    Recent Outpatient Visits           1 month ago Hyponatremia   [place] [facility] Jenelle Mis, [name]       Future Appointments             In 1 month Vangie Genet, MD [place] [facility]   In 4 months Vangie Genet, MD [place] [facility]

## 2024-01-17 NOTE — Telephone Encounter (Signed)
 Prescription Request  01/17/2024  LOV: 11/22/2023  What is the name of the medication or equipment? pantoprazole  (PROTONIX ) 40 MG tablet   Have you contacted your pharmacy to request a refill? Yes   Which pharmacy would you like this sent to?  [facility] - [place], [place] - [address] [place] [address] Phone: [phone] Fax: [phone]    Patient notified that their request is being sent to the clinical staff for review and that they should receive a response within 2 business days.   Please advise at [facility] [phone]

## 2024-01-17 NOTE — Telephone Encounter (Signed)
 Prescription Request  01/17/2024  LOV: 11/22/2023  What is the name of the medication or equipment? montelukast  (SINGULAIR ) 10 MG tablet   Have you contacted your pharmacy to request a refill? Yes   Which pharmacy would you like this sent to?  [facility] - [place], [place] - [address] [place] [address] Phone: [phone] Fax: [phone]    Patient notified that their request is being sent to the clinical staff for review and that they should receive a response within 2 business days.   Please advise at [facility] [phone]

## 2024-01-18 ENCOUNTER — Telehealth: Payer: Self-pay

## 2024-01-18 MED ORDER — NITROGLYCERIN 0.4 MG SL SUBL: SUBLINGUAL_TABLET | SUBLINGUAL | 6 refills | Status: DC

## 2024-01-18 NOTE — Telephone Encounter (Signed)
 Copied from CRM [id number]. Topic: Clinical - Prescription Issue >> Jan 18, 2024  4:28 PM Ethelle Herb L wrote: Reason for CRM: Michael Guzman w/ [facility] rx calling to confirm if rx for insulin  NPH-regular Human (HUMULIN  70/30) (70-30) 100 UNIT/ML injection is for vials or pens.   Best callback number: [phone]  Reference #: [id number] Open: 5:30am - 6pm pacific time

## 2024-01-18 NOTE — Telephone Encounter (Signed)
 Requested medications are due for refill today.  yes  Requested medications are on the active medications list.  yes  Last refill. 08/31/2022 #25 6 rf  Future visit scheduled.   yes  Notes to clinic.  Rx was signed by  Myrle Aspen.    Requested Prescriptions  Pending Prescriptions Disp Refills   nitroGLYCERIN  (NITROSTAT ) 0.4 MG SL tablet 25 tablet 6    Sig: PLACE 1 TABLET UNDER THE TONGUE EVERY 5 MINUTES AS NEEDED FOR CHEST PAIN     Cardiovascular:  Nitrates Passed - 01/18/2024  8:35 AM      Passed - Last BP in normal range    BP Readings from Last 1 Encounters:  01/05/24 129/79         Passed - Last Heart Rate in normal range    Pulse Readings from Last 1 Encounters:  01/05/24 75         Passed - Valid encounter within last 12 months    Recent Outpatient Visits           1 month ago Hyponatremia   [place] [facility] Jenelle Mis, [name]       Future Appointments             In 1 month Vangie Genet, MD [place] [facility]   In 4 months Vangie Genet, MD [place] [facility]

## 2024-01-19 MED ORDER — MONTELUKAST SODIUM 10 MG PO TABS: ORAL_TABLET | ORAL | 0 refills | Status: DC

## 2024-01-19 MED ORDER — PANTOPRAZOLE SODIUM 40 MG PO TBEC: DELAYED_RELEASE_TABLET | ORAL | 0 refills | Status: DC

## 2024-01-19 NOTE — Telephone Encounter (Signed)
 Requested medication (s) are due for refill today: yes  Requested medication (s) are on the active medication list: yes  Last refill:  04/05/23 #100 2 RF  Future visit scheduled: no  Notes to clinic:  prescribed by another provider- pt no longer under prescribers care   Requested Prescriptions  Pending Prescriptions Disp Refills   montelukast  (SINGULAIR ) 10 MG tablet 100 tablet     Sig: TAKE 1 TABLET BY MOUTH  DAILY FOR ALLERGIES     Pulmonology:  Leukotriene Inhibitors Passed - 01/19/2024  9:11 AM      Passed - Valid encounter within last 12 months    Recent Outpatient Visits           1 month ago Hyponatremia   [place] [facility] Jenelle Mis, [name]       Future Appointments             In 1 month Vangie Genet, MD [place] [facility]   In 4 months Vangie Genet, MD [place] [facility]             pantoprazole  (PROTONIX ) 40 MG tablet 100 tablet     Sig: TAKE 1 TABLET BY MOUTH  DAILY TO PREVENT HEARTBURN  AND INDIGESTION     Gastroenterology: Proton Pump Inhibitors Passed - 01/19/2024  9:11 AM      Passed - Valid encounter within last 12 months    Recent Outpatient Visits           1 month ago Hyponatremia   [place] [facility] Jenelle Mis, [name]       Future Appointments             In 1 month Vangie Genet, MD [place] [facility]   In 4 months Vangie Genet, MD [place] [facility]            Refused Prescriptions Disp Refills   gabapentin  (NEURONTIN ) 800 MG tablet 400 tablet     Sig: TAKE 1 TABLET BY MOUTH 3 TO 4 TIMES DAILY FOR PAIN, HOT FLASHES AND SWEATS     Neurology: Anticonvulsants - gabapentin  Passed - 01/19/2024  9:11 AM      Passed - Cr in normal range and within 360 days    Creat  Date Value Ref Range Status  12/09/2023 0.98 0.70 - 1.28 mg/dL Final   Creatinine, Urine  Date  Value Ref Range Status  05/06/2023 129 20 - 320 mg/dL Final         Passed - Completed PHQ-2 or PHQ-9 in the last 360 days      Passed - Valid encounter within last 12 months    Recent Outpatient Visits           1 month ago Hyponatremia   [place] [facility] Jenelle Mis, [name]       Future Appointments             In 1 month Vangie Genet, MD [place] [facility]   In 4 months Vangie Genet, MD [place] [facility]             glucose blood (ONETOUCH ULTRA) test strip 300 strip     Sig: CHECK BLOOD SUGAR 3 TIMES  DAILY     Endocrinology: Diabetes - Testing Supplies Passed - 01/19/2024  9:11 AM      Passed - Valid encounter within last  12 months    Recent Outpatient Visits           1 month ago Hyponatremia   [place] [facility] Jenelle Mis, [name]       Future Appointments             In 1 month Vangie Genet, MD [place] [facility]   In 4 months Vangie Genet, MD [place] [facility]             insulin  NPH-regular Human (HUMULIN  70/30) (70-30) 100 UNIT/ML injection 100 mL     Sig: INJECT SUBCUTANEOUSLY 50  UNITS TWICE DAILY     Endocrinology:  Diabetes - Insulins Failed - 01/19/2024  9:11 AM      Failed - HBA1C is between 0 and 7.9 and within 180 days    Hgb A1c MFr Bld  Date Value Ref Range Status  11/22/2023 8.2 (H) <5.7 % of total Hgb Final    Comment:    For someone without known diabetes, a hemoglobin A1c value of 6.5% or greater indicates that they may have  diabetes and this should be confirmed with a follow-up  test. . For someone with known diabetes, a value <7% indicates  that their diabetes is well controlled and a value  greater than or equal to 7% indicates suboptimal  control. A1c targets should be individualized based on  duration of diabetes, age, comorbid conditions, and   other considerations. . Currently, no consensus exists regarding use of hemoglobin A1c for diagnosis of diabetes for children. Temple Feeler - Valid encounter within last 6 months    Recent Outpatient Visits           1 month ago Hyponatremia   [place] [facility] Jenelle Mis, [name]       Future Appointments             In 1 month Vangie Genet, MD [place] [facility]   In 4 months Vangie Genet, MD [place] [facility]

## 2024-01-19 NOTE — Telephone Encounter (Signed)
 Requested medication (s) are due for refill today: yes  Requested medication (s) are on the active medication list: yes  Last refill:  montelukast  04/05/23 #100 2 RF                pantoprazole : 04/05/23 #100 2 RF  Future visit scheduled: no  Notes to clinic:  prescribed last Warrick Habermann NP at [facility]   Requested Prescriptions  Pending Prescriptions Disp Refills   montelukast  (SINGULAIR ) 10 MG tablet 100 tablet     Sig: TAKE 1 TABLET BY MOUTH  DAILY FOR ALLERGIES     Pulmonology:  Leukotriene Inhibitors Passed - 01/19/2024  9:07 AM      Passed - Valid encounter within last 12 months    Recent Outpatient Visits           1 month ago Hyponatremia   [place] [facility] Jenelle Mis, [name]       Future Appointments             In 1 month Vangie Genet, MD [place] [facility]   In 4 months Vangie Genet, MD [place] [facility]             pantoprazole  (PROTONIX ) 40 MG tablet 100 tablet     Sig: TAKE 1 TABLET BY MOUTH  DAILY TO PREVENT HEARTBURN  AND INDIGESTION     Gastroenterology: Proton Pump Inhibitors Passed - 01/19/2024  9:07 AM      Passed - Valid encounter within last 12 months    Recent Outpatient Visits           1 month ago Hyponatremia   [place] [facility] Jenelle Mis, [name]       Future Appointments             In 1 month Vangie Genet, MD [place] [facility]   In 4 months Vangie Genet, MD [place] [facility]            Refused Prescriptions Disp Refills   gabapentin  (NEURONTIN ) 800 MG tablet 400 tablet     Sig: TAKE 1 TABLET BY MOUTH 3 TO 4 TIMES DAILY FOR PAIN, HOT FLASHES AND SWEATS     Neurology: Anticonvulsants - gabapentin  Passed - 01/19/2024  9:07 AM      Passed - Cr in normal range and within 360 days    Creat  Date Value Ref Range Status   12/09/2023 0.98 0.70 - 1.28 mg/dL Final   Creatinine, Urine  Date Value Ref Range Status  05/06/2023 129 20 - 320 mg/dL Final         Passed - Completed PHQ-2 or PHQ-9 in the last 360 days      Passed - Valid encounter within last 12 months    Recent Outpatient Visits           1 month ago Hyponatremia   [place] [facility] Jenelle Mis, [name]       Future Appointments             In 1 month Vangie Genet, MD [place] [facility]   In 4 months Vangie Genet, MD [place] [facility]             glucose blood (ONETOUCH ULTRA) test strip 300 strip     Sig: CHECK BLOOD SUGAR 3 TIMES  DAILY  Endocrinology: Diabetes - Testing Supplies Passed - 01/19/2024  9:07 AM      Passed - Valid encounter within last 12 months    Recent Outpatient Visits           1 month ago Hyponatremia   [place] [facility] Jenelle Mis, [name]       Future Appointments             In 1 month Vangie Genet, MD [place] [facility]   In 4 months Vangie Genet, MD [place] [facility]             insulin  NPH-regular Human (HUMULIN  70/30) (70-30) 100 UNIT/ML injection 100 mL     Sig: INJECT SUBCUTANEOUSLY 50  UNITS TWICE DAILY     Endocrinology:  Diabetes - Insulins Failed - 01/19/2024  9:07 AM      Failed - HBA1C is between 0 and 7.9 and within 180 days    Hgb A1c MFr Bld  Date Value Ref Range Status  11/22/2023 8.2 (H) <5.7 % of total Hgb Final    Comment:    For someone without known diabetes, a hemoglobin A1c value of 6.5% or greater indicates that they may have  diabetes and this should be confirmed with a follow-up  test. . For someone with known diabetes, a value <7% indicates  that their diabetes is well controlled and a value  greater than or equal to 7% indicates suboptimal  control. A1c targets should be  individualized based on  duration of diabetes, age, comorbid conditions, and  other considerations. . Currently, no consensus exists regarding use of hemoglobin A1c for diagnosis of diabetes for children. Temple Feeler - Valid encounter within last 6 months    Recent Outpatient Visits           1 month ago Hyponatremia   [place] [facility] Jenelle Mis, [name]       Future Appointments             In 1 month Vangie Genet, MD [place] [facility]   In 4 months Vangie Genet, MD [place] [facility]

## 2024-01-19 NOTE — Telephone Encounter (Signed)
 Requested Prescriptions  Pending Prescriptions Disp Refills   gabapentin  (NEURONTIN ) 800 MG tablet 400 tablet     Sig: TAKE 1 TABLET BY MOUTH 3 TO 4 TIMES DAILY FOR PAIN, HOT FLASHES AND SWEATS     Neurology: Anticonvulsants - gabapentin  Passed - 01/19/2024  9:04 AM      Passed - Cr in normal range and within 360 days    Creat  Date Value Ref Range Status  12/09/2023 0.98 0.70 - 1.28 mg/dL Final   Creatinine, Urine  Date Value Ref Range Status  05/06/2023 129 20 - 320 mg/dL Final         Passed - Completed PHQ-2 or PHQ-9 in the last 360 days      Passed - Valid encounter within last 12 months    Recent Outpatient Visits           1 month ago Hyponatremia   [place] [facility] Jenelle Mis, [name]       Future Appointments             In 1 month Vangie Genet, MD [place] [facility]   In 4 months Vangie Genet, MD [place] [facility]             glucose blood (ONETOUCH ULTRA) test strip 300 strip     Sig: CHECK BLOOD SUGAR 3 TIMES  DAILY     Endocrinology: Diabetes - Testing Supplies Passed - 01/19/2024  9:04 AM      Passed - Valid encounter within last 12 months    Recent Outpatient Visits           1 month ago Hyponatremia   [place] [facility] Jenelle Mis, [name]       Future Appointments             In 1 month Vangie Genet, MD [place] [facility]   In 4 months Vangie Genet, MD [place] [facility]             insulin  NPH-regular Human (HUMULIN  70/30) (70-30) 100 UNIT/ML injection 100 mL     Sig: INJECT SUBCUTANEOUSLY 50  UNITS TWICE DAILY     Endocrinology:  Diabetes - Insulins Failed - 01/19/2024  9:04 AM      Failed - HBA1C is between 0 and 7.9 and within 180 days    Hgb A1c MFr Bld  Date Value Ref Range Status  11/22/2023 8.2 (H) <5.7 % of total Hgb Final    Comment:    For someone  without known diabetes, a hemoglobin A1c value of 6.5% or greater indicates that they may have  diabetes and this should be confirmed with a follow-up  test. . For someone with known diabetes, a value <7% indicates  that their diabetes is well controlled and a value  greater than or equal to 7% indicates suboptimal  control. A1c targets should be individualized based on  duration of diabetes, age, comorbid conditions, and  other considerations. . Currently, no consensus exists regarding use of hemoglobin A1c for diagnosis of diabetes for children. Temple Feeler - Valid encounter within last 6 months    Recent Outpatient Visits           1 month ago Hyponatremia   [place] [facility] Jenelle Mis, [name]       Future Appointments  In 1 month Vangie Genet, MD [place] [facility]   In 4 months Vangie Genet, MD [place] [facility]             montelukast  (SINGULAIR ) 10 MG tablet 100 tablet     Sig: TAKE 1 TABLET BY MOUTH  DAILY FOR ALLERGIES     Pulmonology:  Leukotriene Inhibitors Passed - 01/19/2024  9:04 AM      Passed - Valid encounter within last 12 months    Recent Outpatient Visits           1 month ago Hyponatremia   [place] [facility] Jenelle Mis, [name]       Future Appointments             In 1 month Vangie Genet, MD [place] [facility]   In 4 months Vangie Genet, MD [place] [facility]             pantoprazole  (PROTONIX ) 40 MG tablet 100 tablet     Sig: TAKE 1 TABLET BY MOUTH  DAILY TO PREVENT HEARTBURN  AND INDIGESTION     Gastroenterology: Proton Pump Inhibitors Passed - 01/19/2024  9:04 AM      Passed - Valid encounter within last 12 months    Recent Outpatient Visits           1 month ago Hyponatremia    [facility] Jenelle Mis, [name]       Future Appointments             In 1 month Vangie Genet, MD [place] [facility]   In 4 months Vangie Genet, MD [place] [facility]

## 2024-02-24 LAB — HM DIABETES EYE EXAM

## 2024-02-25 ENCOUNTER — Other Ambulatory Visit

## 2024-02-25 DIAGNOSIS — R5383 Other fatigue: Secondary | ICD-10-CM

## 2024-02-25 DIAGNOSIS — E1159 Type 2 diabetes mellitus with other circulatory complications: Secondary | ICD-10-CM

## 2024-02-25 DIAGNOSIS — I10 Essential (primary) hypertension: Secondary | ICD-10-CM

## 2024-02-25 DIAGNOSIS — E559 Vitamin D deficiency, unspecified: Secondary | ICD-10-CM

## 2024-02-25 DIAGNOSIS — I159 Secondary hypertension, unspecified: Secondary | ICD-10-CM

## 2024-02-25 DIAGNOSIS — G4719 Other hypersomnia: Secondary | ICD-10-CM

## 2024-02-25 DIAGNOSIS — E781 Pure hyperglyceridemia: Secondary | ICD-10-CM

## 2024-02-27 ENCOUNTER — Other Ambulatory Visit: Payer: Self-pay | Admitting: Family Medicine

## 2024-02-27 DIAGNOSIS — I2510 Atherosclerotic heart disease of native coronary artery without angina pectoris: Secondary | ICD-10-CM

## 2024-02-28 ENCOUNTER — Encounter: Payer: Self-pay | Admitting: Family Medicine

## 2024-02-28 ENCOUNTER — Ambulatory Visit (INDEPENDENT_AMBULATORY_CARE_PROVIDER_SITE_OTHER): Admitting: Family Medicine

## 2024-02-28 VITALS — BP 127/82 | HR 57 | Temp 99.0°F | Ht 70.0 in | Wt 194.0 lb

## 2024-02-28 DIAGNOSIS — R61 Generalized hyperhidrosis: Secondary | ICD-10-CM | POA: Diagnosis not present

## 2024-02-28 DIAGNOSIS — I2510 Atherosclerotic heart disease of native coronary artery without angina pectoris: Secondary | ICD-10-CM

## 2024-02-28 DIAGNOSIS — R933 Abnormal findings on diagnostic imaging of other parts of digestive tract: Secondary | ICD-10-CM

## 2024-02-28 DIAGNOSIS — K219 Gastro-esophageal reflux disease without esophagitis: Secondary | ICD-10-CM

## 2024-02-28 DIAGNOSIS — I10 Essential (primary) hypertension: Secondary | ICD-10-CM

## 2024-02-28 DIAGNOSIS — E1159 Type 2 diabetes mellitus with other circulatory complications: Secondary | ICD-10-CM | POA: Diagnosis not present

## 2024-02-28 DIAGNOSIS — F339 Major depressive disorder, recurrent, unspecified: Secondary | ICD-10-CM

## 2024-02-28 DIAGNOSIS — E1169 Type 2 diabetes mellitus with other specified complication: Secondary | ICD-10-CM

## 2024-02-28 DIAGNOSIS — Z23 Encounter for immunization: Secondary | ICD-10-CM | POA: Diagnosis not present

## 2024-02-28 DIAGNOSIS — Z860101 Personal history of adenomatous and serrated colon polyps: Secondary | ICD-10-CM | POA: Diagnosis not present

## 2024-02-28 DIAGNOSIS — Z0001 Encounter for general adult medical examination with abnormal findings: Secondary | ICD-10-CM

## 2024-02-28 DIAGNOSIS — C61 Malignant neoplasm of prostate: Secondary | ICD-10-CM

## 2024-02-28 DIAGNOSIS — D649 Anemia, unspecified: Secondary | ICD-10-CM | POA: Insufficient documentation

## 2024-02-28 DIAGNOSIS — Z0000 Encounter for general adult medical examination without abnormal findings: Secondary | ICD-10-CM

## 2024-02-28 MED ORDER — GLYCOPYRROLATE 2 MG PO TABS
ORAL_TABLET | ORAL | 3 refills | Status: DC
Start: 2024-02-28 — End: 2024-04-05

## 2024-02-28 NOTE — Assessment & Plan Note (Signed)
 Followed by Urology

## 2024-02-28 NOTE — Progress Notes (Signed)
 Subjective:   Michael Guzman is a 78 y.o. male who presents for Medicare Annual/Subsequent preventive examination.  Visit Complete: In person  Patient Medicare AWV questionnaire was completed by the patient on 02/28/2024; I have confirmed that all information answered by patient is correct and no changes since this date.  Cardiac Risk Factors include: diabetes mellitus;male gender;smoking/ tobacco exposure     Objective:    Today's Vitals   02/28/24 1020 02/28/24 1151  BP: 127/82   Pulse: (!) 57   Temp: 99 F (37.2 C)   SpO2: 98%   Weight: 194 lb (88 kg)   Height: 5' 10 (1.778 m)   PainSc: 0-No pain 0-No pain   Body mass index is 27.84 kg/m.     02/28/2024   11:59 AM 11/30/2023    9:06 PM 11/11/2023    5:25 PM 12/04/2022   11:13 AM 09/21/2022   11:12 AM 09/15/2022    2:00 PM 08/03/2022    9:48 AM  Advanced Directives  Does Patient Have a Medical Advance Directive? Yes Yes Yes Yes Yes Yes Yes  Type of Advance Directive Living will Living will Living will Living will Healthcare Power of Attorney;Living will Healthcare Power of [place];Living will Healthcare Power of [place];Living will  Does patient want to make changes to medical advance directive? No - Patient declined No - Patient declined No - Patient declined No - Patient declined  No - Guardian declined No - Patient declined  Copy of Healthcare Power of Attorney in Chart?     No - copy requested No - copy requested No - copy requested    Current Medications (verified) Outpatient Encounter Medications as of 02/28/2024  Medication Sig   Ascorbic Acid (VITAMIN C) 1000 MG tablet Take 1,000 mg by mouth daily.   aspirin  EC 81 MG tablet Take 1 tablet (81 mg total) by mouth daily.   atorvastatin  (LIPITOR ) 10 MG tablet TAKE 1 TABLET BY MOUTH DAILY   B Complex-C (SUPER B COMPLEX PO) Take 1 tablet by mouth daily.   Blood Glucose Monitoring Suppl (ONE TOUCH ULTRA 2) w/Device KIT Use as directed   buPROPion  (WELLBUTRIN  XL)  300 MG 24 hr tablet TAKE 1 TABLET BY MOUTH DAILY FOR MOOD, FOCUS AND CONCENTRATION   Cholecalciferol  (VITAMIN D3) 250 MCG (10000 UT) capsule Take 10,000 Units by mouth daily.   gabapentin  (NEURONTIN ) 800 MG tablet TAKE 1 TABLET BY MOUTH 3 TO 4 TIMES DAILY FOR PAIN, HOT FLASHES AND SWEATS   glucose blood (ONETOUCH ULTRA) test strip CHECK BLOOD SUGAR 3 TIMES  DAILY   insulin  NPH-regular Human (HUMULIN  70/30) (70-30) 100 UNIT/ML injection INJECT SUBCUTANEOUSLY 50  UNITS TWICE DAILY   Insulin  Syringes, Disposable, U-100 1 ML MISC 50 Units by Does not apply route 2 (two) times daily. inject 50 units into skin 2 x /day   isosorbide  mononitrate (IMDUR ) 30 MG 24 hr tablet Take 1 tablet (30 mg total) by mouth daily.   Lancets (ONETOUCH DELICA PLUS LANCET33G) MISC CHECK BLOOD SUGAR 3 TIMES  DAILY   latanoprost  (XALATAN ) 0.005 % ophthalmic solution Place 1 drop into both eyes at bedtime.   Magnesium  500 MG TABS Take 500 mg by mouth daily.   metFORMIN  (GLUCOPHAGE -XR) 500 MG 24 hr tablet TAKE 2 TABLETS BY MOUTH TWICE  DAILY WITH MEALS FOR DIABETES   metoprolol  succinate (TOPROL -XL) 25 MG 24 hr tablet TAKE ONE-HALF TABLET BY MOUTH  DAILY   montelukast  (SINGULAIR ) 10 MG tablet TAKE 1 TABLET BY MOUTH  DAILY FOR ALLERGIES   nitroGLYCERIN  (NITROSTAT ) 0.4 MG SL tablet PLACE 1 TABLET UNDER THE TONGUE EVERY 5 MINUTES AS NEEDED FOR CHEST PAIN   pantoprazole  (PROTONIX ) 40 MG tablet TAKE 1 TABLET BY MOUTH  DAILY TO PREVENT HEARTBURN  AND INDIGESTION   tamsulosin  (FLOMAX ) 0.4 MG CAPS capsule Take 0.4 mg by mouth at bedtime.   Vitamin E 450 MG (1000 UT) CAPS Take 450 mg by mouth daily.   zinc gluconate 50 MG tablet Take 50 mg by mouth daily.   [DISCONTINUED] glycopyrrolate  (ROBINUL ) 2 MG tablet Take  1 tablet  3 x / day for Excessive Sweating  TAKE ONE TABLET BY                                                 MOUTH   glycopyrrolate  (ROBINUL ) 2 MG tablet Take  1 tablet  3 x / day for Excessive Sweating  TAKE ONE TABLET BY                                                  MOUTH   No facility-administered encounter medications on file as of 02/28/2024.    Allergies (verified) Patient has no known allergies.   History: Past Medical History:  Diagnosis Date   Arthritis    CAD (coronary artery disease)    a.s/p DES to mid LAD and OM2 06/2012.   Chronic back pain    Chronically dry eyes    Diabetes mellitus without complication (HCC)    TYPE 2    DKA (diabetic ketoacidoses) 01/30/2017   Elevated PSA    being monitored   Fatty liver    GERD (gastroesophageal reflux disease)    Hx of radiation therapy    prostate , [facility]    Hypertension    Hypertriglyceridemia    Intermediate coronary syndrome (HCC) 07/26/2012   Nausea vomiting and diarrhea 11/11/2023   Neuromuscular disorder (HCC)    neuropathy feet and legs   Prostate cancer (HCC)    RADTIATION    Past Surgical History:  Procedure Laterality Date   APPENDECTOMY     CARDIAC SURGERY  ~2017   3 stent placed.   COLONOSCOPY  02/12/2010   Buccini   COLONOSCOPY WITH PROPOFOL  N/A 09/21/2022   Procedure: COLONOSCOPY WITH PROPOFOL ;  Surgeon: Guzman, Michael Alu., MD;  Location: [facility];  Service: Gastroenterology;  Laterality: N/A;   ENDOSCOPIC MUCOSAL RESECTION N/A 09/21/2022   Procedure: ENDOSCOPIC MUCOSAL RESECTION;  Surgeon: Michael Campi Michael Alu., MD;  Location: [facility];  Service: Gastroenterology;  Laterality: N/A;   HEMOSTASIS CLIP PLACEMENT  09/21/2022   Procedure: HEMOSTASIS CLIP PLACEMENT;  Surgeon: Michael Guzman., MD;  [facility];  Service: Gastroenterology;;   LEFT HEART CATHETERIZATION WITH CORONARY ANGIOGRAM N/A 06/27/2012   Procedure: LEFT HEART CATHETERIZATION WITH CORONARY ANGIOGRAM;  Surgeon: Michael Gathers, MD;  Location: [facility];  Service: Cardiovascular;  Laterality: N/A;   LEFT HEART CATHETERIZATION WITH CORONARY ANGIOGRAM N/A 07/25/2012   Procedure: LEFT HEART CATHETERIZATION WITH  CORONARY ANGIOGRAM;  Surgeon: Michael Matsu, MD;  Location: [facility] CATH LAB;  Service: Cardiovascular;  Laterality: N/A;   PROSTATE BIOPSY     RADIOLOGY WITH ANESTHESIA N/A 12/03/2015  Procedure: MRI LUMBAR SPINE;  Surgeon: Medication Radiologist, MD;  Location: [facility] OR;  Service: Radiology;  Laterality: N/A;   SUBMUCOSAL LIFTING INJECTION  09/21/2022   Procedure: SUBMUCOSAL LIFTING INJECTION;  Surgeon: Michael Campi Michael Alu., MD;  [facility];  Service: Gastroenterology;;   TOTAL HIP ARTHROPLASTY Right 04/28/2019   Procedure: TOTAL HIP ARTHROPLASTY ANTERIOR APPROACH;  Surgeon: Neil Balls, MD;  Location: [facility];  Service: Orthopedics;  Laterality: Right;   Family History  Problem Relation Age of Onset   Diabetes Sister    Stomach cancer Neg Hx    Colon cancer Neg Hx    Esophageal cancer Neg Hx    Inflammatory bowel disease Neg Hx    Liver disease Neg Hx    Pancreatic cancer Neg Hx    Rectal cancer Neg Hx    Social History   Socioeconomic History   Marital status: Married    Spouse name: Not on file   Number of children: 4   Years of education: 2 y colleg   Highest education level: Not on file  Occupational History   Occupation: SUPERVISOR    Employer: [employer]   Occupation: retired  Tobacco Use   Smoking status: Former    Current packs/day: 0.00    Average packs/day: 0.8 packs/day for 52.9 years (39.6 ttl pk-yrs)    Types: Cigarettes    Start date: 51    Quit date: 07/27/2011    Years since quitting: 12.6   Smokeless tobacco: Never  Vaping Use   Vaping status: Never Used  Substance and Sexual Activity   Alcohol use: Not Currently    Comment: qut 20 years ago per patient    Drug use: No   Sexual activity: Not Currently  Other Topics Concern   Not on file  Social History Narrative   Works as    Social Drivers of Corporate investment banker Strain: Low Risk  (12/09/2023)   Overall Financial Resource Strain (CARDIA)    Difficulty of Paying  Living Expenses: Not hard at all  Food Insecurity: Patient Declined (02/28/2024)   Hunger Vital Sign    Worried About Running Out of Food in the Last Year: Patient declined    Ran Out of Food in the Last Year: Patient declined  Transportation Needs: Patient Declined (02/28/2024)   PRAPARE - Administrator, Civil Service (Medical): Patient declined    Lack of Transportation (Non-Medical): Patient declined  Physical Activity: Sufficiently Active (12/09/2023)   Exercise Vital Sign    Days of Exercise per Week: 5 days    Minutes of Exercise per Session: 30 min  Stress: No Stress Concern Present (12/09/2023)   [employer] of Occupational Health - Occupational Stress Questionnaire    Feeling of Stress : Not at all  Social Connections: Moderately Integrated (12/09/2023)   Social Connection and Isolation Panel    Frequency of Communication with Friends and Family: More than three times a week    Frequency of Social Gatherings with Friends and Family: More than three times a week    Attends Religious Services: 1 to 4 times per year    Active Member of [employer] or Organizations: No    Attends Banker Meetings: 1 to 4 times per year    Marital Status: Widowed    Tobacco Counseling Counseling given: Not Answered   Clinical Intake:  Pre-visit preparation completed: Yes  Pain : No/denies pain Pain Score: 0-No pain     Diabetes: Yes  CBG done?: No Did pt. bring in CBG monitor from home?: No  How often do you need to have someone help you when you read instructions, pamphlets, or other written materials from your doctor or pharmacy?: 1 - Never  Interpreter Needed?: No      Activities of Daily Living    02/28/2024   11:53 AM 12/09/2023    9:05 AM  In your present state of health, do you have any difficulty performing the following activities:  Hearing? 0 0  Vision? 1 0  Difficulty concentrating or making decisions? 0 0  Walking or climbing stairs? 0 0   Dressing or bathing? 0 0  Doing errands, shopping? 0 0  Preparing Food and eating ? N N  Using the Toilet? N N  In the past six months, have you accidently leaked urine? N N  Do you have problems with loss of bowel control? N N  Managing your Medications? N N  Managing your Finances? N N  Housekeeping or managing your Housekeeping? N N    Patient Care Team: Jenelle Mis, [name] as PCP - General (Family Medicine) Hugh Madura, MD as PCP - Cardiology (Cardiology) Merryl Abraham, [facility] (Inactive) as Pharmacist (Pharmacist)  Indicate any recent Medical Services you may have received from other than [facility] providers in the past year (date may be approximate).     Assessment:   This is a routine wellness examination for [place].  Hearing/Vision screen No results found.   Goals Addressed             This Visit's Progress    Activity and Exercise Increased       Evidence-based guidance:  Review current exercise levels.  Assess patient perspective on exercise or activity level, barriers to increasing activity, motivation and readiness for change.  Recommend or set healthy exercise goal based on individual tolerance.  Encourage small steps toward making change in amount of exercise or activity.  Urge reduction of sedentary activities or screen time.  Promote group activities within the community or with family or support person.  Consider referral to rehabiliation therapist for assessment and exercise/activity plan.   Notes:  wants to continue working in this garden and getting out to be socia more        Depression Screen    02/28/2024   10:29 AM 12/17/2023   10:43 AM 12/09/2023    9:07 AM 11/22/2023    4:50 PM 05/09/2023    1:14 AM 12/04/2022   11:14 AM 08/03/2022    9:43 AM  PHQ 2/9 Scores  PHQ - 2 Score 1 0 0 0 0 1 0  PHQ- 9 Score 7          Fall Risk    02/28/2024   11:56 AM 12/17/2023   10:58 AM 11/22/2023    4:07 PM 05/09/2023    1:14 AM 12/04/2022   11:14 AM   Fall Risk   Falls in the past year? 0 0 0 0 0  Number falls in past yr: 0  0  0  Injury with Fall? 0  0  0  Risk for fall due to : No Fall Risks Orthopedic patient No Fall Risks No Fall Risks Impaired balance/gait  Follow up Falls evaluation completed  Falls prevention discussed;Falls evaluation completed Falls evaluation completed;Education provided;Falls prevention discussed Falls evaluation completed;Falls prevention discussed    MEDICARE RISK AT HOME: Medicare Risk at Home Any stairs in or around the home?: Yes If so,  are there any without handrails?: Yes Home free of loose throw rugs in walkways, pet beds, electrical cords, etc?: Yes Adequate lighting in your home to reduce risk of falls?: Yes Life alert?: No Use of a cane, walker or w/c?: Yes Grab bars in the bathroom?: Yes Shower chair or bench in shower?: Yes Elevated toilet seat or a handicapped toilet?: Yes  TIMED UP AND GO:  Was the test performed?  Yes  Length of time to ambulate 10 feet: 10 sec Gait steady and fast without use of assistive device    Cognitive Function:    07/18/2020   12:19 PM  MMSE - Mini Mental State Exam  Orientation to time 5  Orientation to Place 5  Registration 3  Attention/ Calculation 5  Recall 3  Language- name 2 objects 2  Language- repeat 1  Language- follow 3 step command 3  Language- read & follow direction 1  Write a sentence 1  Copy design 1  Total score 30        02/28/2024   11:58 AM 12/09/2023    9:10 AM  6CIT Screen  What Year? 0 points 0 points  What month? 0 points 0 points  What time? 0 points 0 points  Count back from 20 0 points 0 points  Months in reverse 0 points 2 points  Repeat phrase 0 points 0 points  Total Score 0 points 2 points    Immunizations Immunization History  Administered Date(s) Administered   Influenza Split 06/26/2012   Influenza, High Dose Seasonal PF 06/20/2015, 06/23/2016, 06/14/2017, 06/14/2019, 07/01/2020, 07/21/2021,  08/03/2022, 08/17/2023   Influenza-Unspecified 06/14/2013, 06/25/2014, 06/03/2018   PFIZER(Purple Top)SARS-COV-2 Vaccination 10/28/2019, 11/22/2019, 06/26/2020   Pneumococcal Conjugate-13 04/23/2016   Pneumococcal Polysaccharide-23 06/14/2017   Pneumococcal-Unspecified 06/16/2010   Td 09/17/2003   Tdap 11/26/2013, 12/09/2023   Zoster Recombinant(Shingrix) 02/28/2024   Zoster, Live 07/12/2012    TDAP status: Up to date  Flu Vaccine status: Up to date  Pneumococcal vaccine status: Up to date  Covid-19 vaccine status: Information provided on how to obtain vaccines.   Qualifies for Shingles Vaccine? Yes   Zostavax completed Yes   Shingrix Completed?: Yes  Screening Tests Health Maintenance  Topic Date Due   OPHTHALMOLOGY EXAM  02/28/2024 (Originally 06/24/2023)   COVID-19 Vaccine (4 - 2024-25 season) 03/15/2024 (Originally 05/16/2023)   INFLUENZA VACCINE  04/14/2024   Zoster Vaccines- Shingrix (2 of 2) 04/24/2024   Diabetic kidney evaluation - Urine ACR  05/05/2024   FOOT EXAM  08/16/2024   HEMOGLOBIN A1C  08/26/2024   Diabetic kidney evaluation - eGFR measurement  02/24/2025   Medicare Annual Wellness (AWV)  02/27/2025   DTaP/Tdap/Td (4 - Td or Tdap) 12/08/2033   Pneumococcal Vaccine: 50+ Years  Completed   Hepatitis C Screening  Completed   HPV VACCINES  Aged Out   Meningococcal B Vaccine  Aged Out   Lung Cancer Screening  Discontinued   Colonoscopy  Discontinued    Health Maintenance  There are no preventive care reminders to display for this patient.   Colorectal cancer screening: Type of screening: Colonoscopy. Completed 2024. Repeat every 10 years  Lung Cancer Screening: (Low Dose CT Chest recommended if Age 52-80 years, 20 pack-year currently smoking OR have quit w/in 15years.) does not qualify.   Lung Cancer Screening Referral: n/a  Additional Screening:  Hepatitis C Screening: does not qualify; Completed 07/24/2024  Vision Screening: Recommended annual  ophthalmology exams for early detection of glaucoma and other disorders of the  eye. Is the patient up to date with their annual eye exam?  Yes  Who is the provider or what is the name of the office in which the patient attends annual eye exams? 11/22/2023 If pt is not established with a provider, would they like to be referred to a provider to establish care? No .   Dental Screening: Recommended annual dental exams for proper oral hygiene  Diabetic Foot Exam: Diabetic Foot Exam: Completed 08/17/2023  Community Resource Referral / Chronic Care Management: CRR required this visit?  No   CCM required this visit?  No     Plan:     I have personally reviewed and noted the following in the patient's chart:   Medical and social history Use of alcohol, tobacco or illicit drugs  Current medications and supplements including opioid prescriptions. Patient is not currently taking opioid prescriptions. Functional ability and status Nutritional status Physical activity Advanced directives List of other physicians Hospitalizations, surgeries, and ER visits in previous 12 months Vitals Screenings to include cognitive, depression, and falls Referrals and appointments  In addition, I have reviewed and discussed with patient certain preventive protocols, quality metrics, and best practice recommendations. A written personalized care plan for preventive services as well as general preventive health recommendations were provided to patient.     Jenelle Mis, [name]   02/28/2024   After Visit Summary: (In Person-Printed) AVS printed and given to the patient  Nurse Notes: n/a

## 2024-02-28 NOTE — Assessment & Plan Note (Signed)
 Upcoming stress test with Cardiology. DM, HTN, HLD well controlled.

## 2024-02-28 NOTE — Assessment & Plan Note (Signed)
 Chronic well controlled. Keep appts with cardiology. Recommend heart healthy diet such as Mediterranean diet with whole grains, fruits, vegetable, fish, lean meats, nuts, and olive oil. Limit salt. Encouraged moderate walking, 3-5 times/week for 30-50 minutes each session. Aim for at least 150 minutes.week. Goal should be pace of 3 miles/hours, or walking 1.5 miles in 30 minutes. Avoid tobacco products. Avoid excess alcohol. Take medications as prescribed and bring medications and blood pressure log with cuff to each office visit. Seek medical care for chest pain, palpitations, shortness of breath with exertion, dizziness/lightheadedness, vision changes, recurrent headaches, or swelling of extremities.

## 2024-02-28 NOTE — Progress Notes (Signed)
 Subjective:  HPI: Michael Guzman is a 78 y.o. male presenting on 02/28/2024 for Medical Management of Chronic Issues (3 month Chronic F/u )   HPI Patient is in today for chronic condition management and AWV. He reports today he ate something that caused nausea and diarrhea last night but this is now resolved. Denies fever, chills, body aches, melena, hematochezia, vomiting, sick exposures.   DM: A1c 7.1%, home readings elevated due to poor dietary habits recently, averaging 120 FBG, on Humulin  70/30 30 units daily if BG >110 and Metformin  1000mg  BID. Eye exam upcoming.  Anemia: denies hematochezia or melena, rarely eats red meat or greens, is due for 9-12 month repeat colonoscopy   CKD stage 2: avoiding NSAIDs   HTN: Well controlled on Metoprolol  12.5mg  daily   HLD: Atorvastatin  10mg  daily   ASCAD: last stress 10/2018 normal, followed by Dr Renna Cary, upcoming stress in April   Prostate CA: followed by Dr Derrick Fling  Review of Systems  All other systems reviewed and are negative.   Relevant past medical history reviewed and updated as indicated.   Past Medical History:  Diagnosis Date   Arthritis    CAD (coronary artery disease)    a.s/p DES to mid LAD and OM2 06/2012.   Chronic back pain    Chronically dry eyes    Diabetes mellitus without complication (HCC)    TYPE 2    DKA (diabetic ketoacidoses) 01/30/2017   Elevated PSA    being monitored   Fatty liver    GERD (gastroesophageal reflux disease)    Hx of radiation therapy    prostate , [facility]    Hypertension    Hypertriglyceridemia    Intermediate coronary syndrome (HCC) 07/26/2012   Nausea vomiting and diarrhea 11/11/2023   Neuromuscular disorder (HCC)    neuropathy feet and legs   Prostate cancer (HCC)    RADTIATION      Past Surgical History:  Procedure Laterality Date   APPENDECTOMY     CARDIAC SURGERY  ~2017   3 stent placed.   COLONOSCOPY  02/12/2010   Buccini   COLONOSCOPY WITH  PROPOFOL  N/A 09/21/2022   Procedure: COLONOSCOPY WITH PROPOFOL ;  Surgeon: Mansouraty, Albino Alu., MD;  Location: [facility];  Service: Gastroenterology;  Laterality: N/A;   ENDOSCOPIC MUCOSAL RESECTION N/A 09/21/2022   Procedure: ENDOSCOPIC MUCOSAL RESECTION;  Surgeon: Brice Campi Albino Alu., MD;  Location: [facility];  Service: Gastroenterology;  Laterality: N/A;   HEMOSTASIS CLIP PLACEMENT  09/21/2022   Procedure: HEMOSTASIS CLIP PLACEMENT;  Surgeon: Normie Becton., MD;  Location: [facility];  Service: Gastroenterology;;   LEFT HEART CATHETERIZATION WITH CORONARY ANGIOGRAM N/A 06/27/2012   Procedure: LEFT HEART CATHETERIZATION WITH CORONARY ANGIOGRAM;  Surgeon: Dorothye Gathers, MD;  Location: [facility] CATH LAB;  Service: Cardiovascular;  Laterality: N/A;   LEFT HEART CATHETERIZATION WITH CORONARY ANGIOGRAM N/A 07/25/2012   Procedure: LEFT HEART CATHETERIZATION WITH CORONARY ANGIOGRAM;  Surgeon: Jacqueline Matsu, MD;  Location: [facility] CATH LAB;  Service: Cardiovascular;  Laterality: N/A;   PROSTATE BIOPSY     RADIOLOGY WITH ANESTHESIA N/A 12/03/2015   Procedure: MRI LUMBAR SPINE;  Surgeon: Medication Radiologist, MD;  Location: [facility] OR;  Service: Radiology;  Laterality: N/A;   SUBMUCOSAL LIFTING INJECTION  09/21/2022   Procedure: SUBMUCOSAL LIFTING INJECTION;  Surgeon: Brice Campi Albino Alu., MD;  Location: [facility];  Service: Gastroenterology;;   TOTAL HIP ARTHROPLASTY Right 04/28/2019   Procedure: TOTAL HIP ARTHROPLASTY ANTERIOR APPROACH;  Surgeon: Neil Balls, MD;  Location:  [facility];  Service: Orthopedics;  Laterality: Right;    Allergies and medications reviewed and updated.   Current Outpatient Medications:    Ascorbic Acid (VITAMIN C) 1000 MG tablet, Take 1,000 mg by mouth daily., Disp: , Rfl:    aspirin  EC 81 MG tablet, Take 1 tablet (81 mg total) by mouth daily., Disp: 90 tablet, Rfl: 3   atorvastatin  (LIPITOR ) 10 MG tablet, TAKE 1 TABLET BY MOUTH DAILY, Disp: 100 tablet, Rfl: 2   B  Complex-C (SUPER B COMPLEX PO), Take 1 tablet by mouth daily., Disp: , Rfl:    Blood Glucose Monitoring Suppl (ONE TOUCH ULTRA 2) w/Device KIT, Use as directed, Disp: 1 kit, Rfl: 0   buPROPion  (WELLBUTRIN  XL) 300 MG 24 hr tablet, TAKE 1 TABLET BY MOUTH DAILY FOR MOOD, FOCUS AND CONCENTRATION, Disp: 90 tablet, Rfl: 3   Cholecalciferol  (VITAMIN D3) 250 MCG (10000 UT) capsule, Take 10,000 Units by mouth daily., Disp: , Rfl:    gabapentin  (NEURONTIN ) 800 MG tablet, TAKE 1 TABLET BY MOUTH 3 TO 4 TIMES DAILY FOR PAIN, HOT FLASHES AND SWEATS, Disp: 400 tablet, Rfl: 2   glucose blood (ONETOUCH ULTRA) test strip, CHECK BLOOD SUGAR 3 TIMES  DAILY, Disp: 300 strip, Rfl: 3   insulin  NPH-regular Human (HUMULIN  70/30) (70-30) 100 UNIT/ML injection, INJECT SUBCUTANEOUSLY 50  UNITS TWICE DAILY, Disp: 100 mL, Rfl: 2   Insulin  Syringes, Disposable, U-100 1 ML MISC, 50 Units by Does not apply route 2 (two) times daily. inject 50 units into skin 2 x /day, Disp: 200 each, Rfl: 3   isosorbide  mononitrate (IMDUR ) 30 MG 24 hr tablet, Take 1 tablet (30 mg total) by mouth daily., Disp: 90 tablet, Rfl: 0   Lancets (ONETOUCH DELICA PLUS LANCET33G) MISC, CHECK BLOOD SUGAR 3 TIMES  DAILY, Disp: 300 each, Rfl: 3   latanoprost  (XALATAN ) 0.005 % ophthalmic solution, Place 1 drop into both eyes at bedtime., Disp: , Rfl:    Magnesium  500 MG TABS, Take 500 mg by mouth daily., Disp: , Rfl:    metFORMIN  (GLUCOPHAGE -XR) 500 MG 24 hr tablet, TAKE 2 TABLETS BY MOUTH TWICE  DAILY WITH MEALS FOR DIABETES, Disp: 400 tablet, Rfl: 2   metoprolol  succinate (TOPROL -XL) 25 MG 24 hr tablet, TAKE ONE-HALF TABLET BY MOUTH  DAILY, Disp: 45 tablet, Rfl: 3   montelukast  (SINGULAIR ) 10 MG tablet, TAKE 1 TABLET BY MOUTH  DAILY FOR ALLERGIES, Disp: 100 tablet, Rfl: 0   nitroGLYCERIN  (NITROSTAT ) 0.4 MG SL tablet, PLACE 1 TABLET UNDER THE TONGUE EVERY 5 MINUTES AS NEEDED FOR CHEST PAIN, Disp: 25 tablet, Rfl: 6   pantoprazole  (PROTONIX ) 40 MG tablet, TAKE 1  TABLET BY MOUTH  DAILY TO PREVENT HEARTBURN  AND INDIGESTION, Disp: 100 tablet, Rfl: 0   tamsulosin  (FLOMAX ) 0.4 MG CAPS capsule, Take 0.4 mg by mouth at bedtime., Disp: , Rfl:    Vitamin E 450 MG (1000 UT) CAPS, Take 450 mg by mouth daily., Disp: , Rfl:    zinc gluconate 50 MG tablet, Take 50 mg by mouth daily., Disp: , Rfl:    glycopyrrolate  (ROBINUL ) 2 MG tablet, Take  1 tablet  3 x / day for Excessive Sweating  TAKE ONE TABLET BY                                                 MOUTH,  Disp: 270 tablet, Rfl: 3  No Known Allergies  Objective:   BP 127/82   Pulse (!) 57   Temp 99 F (37.2 C)   Ht 5' 10 (1.778 m)   Wt 194 lb (88 kg)   SpO2 98%   BMI 27.84 kg/m      02/28/2024   10:20 AM 01/05/2024   11:44 AM 01/05/2024   11:43 AM  Vitals with BMI  Height 5' 10    Weight 194 lbs    BMI 27.84    Systolic 127 129 161  Diastolic 82 79 81  Pulse 57 75 72     Physical Exam Vitals and nursing note reviewed.  Constitutional:      Appearance: Normal appearance. He is normal weight.  HENT:     Head: Normocephalic and atraumatic.   Cardiovascular:     Rate and Rhythm: Normal rate and regular rhythm.     Pulses: Normal pulses.     Heart sounds: Normal heart sounds.  Pulmonary:     Effort: Pulmonary effort is normal.     Breath sounds: Normal breath sounds.  Abdominal:     General: Bowel sounds are normal. There is no distension.     Palpations: Abdomen is soft.     Tenderness: There is no abdominal tenderness.   Skin:    General: Skin is warm and dry.     Capillary Refill: Capillary refill takes less than 2 seconds.   Neurological:     General: No focal deficit present.     Mental Status: He is alert and oriented to person, place, and time. Mental status is at baseline.   Psychiatric:        Mood and Affect: Mood normal.        Behavior: Behavior normal.        Thought Content: Thought content normal.        Judgment: Judgment normal.     Assessment & Plan:   Encounter for Medicare annual wellness exam  Hyperhidrosis -     Glycopyrrolate ; Take  1 tablet  3 x / day for Excessive Sweating  TAKE ONE TABLET BY                                                 MOUTH  Dispense: 270 tablet; Refill: 3  Encounter for immunization -     Varicella-zoster vaccine IM  Hx of adenomatous colonic polyps -     Ambulatory referral to Gastroenterology  Abnormal colonoscopy Assessment & Plan: Referral placed back to GI for follow up   Orders: -     Ambulatory referral to Gastroenterology  Type 2 diabetes mellitus with vascular disease (HCC) Assessment & Plan: A1c 7.1%. He is unsure of fasting readings but is only taking insulin  daily if BG >110. A1c and uACR UTD. Foot exam UTD. Vaccines UTD. Retinal eye exam upcoming. Recommend heart healthy diet such as Mediterranean diet with whole grains, fruits, vegetable, fish, lean meats, nuts, and olive oil. Limit salt. Encouraged moderate walking, 3-5 times/week for 30-50 minutes each session. Aim for at least 150 minutes.week. Goal should be pace of 3 miles/hours, or walking 1.5 miles in 30 minutes. Seek medical care for urinary frequency, extreme thirst, vision changes, lightheadedness, dizziness.  Follow up in 3 months or sooner if needed    Primary hypertension Assessment &  Plan: Chronic well controlled. Keep appts with cardiology. Recommend heart healthy diet such as Mediterranean diet with whole grains, fruits, vegetable, fish, lean meats, nuts, and olive oil. Limit salt. Encouraged moderate walking, 3-5 times/week for 30-50 minutes each session. Aim for at least 150 minutes.week. Goal should be pace of 3 miles/hours, or walking 1.5 miles in 30 minutes. Avoid tobacco products. Avoid excess alcohol. Take medications as prescribed and bring medications and blood pressure log with cuff to each office visit. Seek medical care for chest pain, palpitations, shortness of breath with exertion, dizziness/lightheadedness,  vision changes, recurrent headaches, or swelling of extremities.    Hyperlipidemia associated with type 2 diabetes mellitus (HCC) Assessment & Plan: Continue Atorvastatin . I recommend consuming a heart healthy diet such as Mediterranean diet or DASH diet with whole grains, fruits, vegetable, fish, lean meats, nuts, and olive oil. Limit sweets and processed foods. I also encourage moderate intensity exercise 150 minutes weekly. This is 3-5 times weekly for 30-50 minutes each session. Goal should be pace of 3 miles/hours, or walking 1.5 miles in 30 minutes.   ASCAD s/p PTCA (06/2012) Assessment & Plan: Upcoming stress test with Cardiology. DM, HTN, HLD well controlled.   Gastroesophageal reflux disease without esophagitis Assessment & Plan: Well controlled on Omeprazole 20mg  daily   Malignant neoplasm of prostate [facility]) Assessment & Plan: Followed by Urology.    Depression, recurrent (HCC) Assessment & Plan: Well controlled on Wellbutrin .    Anemia, unspecified type Assessment & Plan: Add on iron panel and collect stool card. Referral back to GI for evaluation. Start Ferrous Sulfate 325mg  every other day and repeat in 6-8 weeks      Follow up plan: Return in 3 months (on 05/30/2024).  Jenelle Mis, [name]

## 2024-02-28 NOTE — Assessment & Plan Note (Signed)
Well controlled on Omeprazole 20 mg daily.

## 2024-02-28 NOTE — Patient Instructions (Signed)
  Michael Guzman , Thank you for taking time to come for your Medicare Wellness Visit. I appreciate your ongoing commitment to your health goals. Please review the following plan we discussed and let me know if I can assist you in the future.   These are the goals we discussed:  Goals      CCM Expected Outcome:  Monitor, Self-Manage and Reduce Symptoms of Diabetes     Current Barriers:  Knowledge Deficits related to plan of care for management of DMII   RNCM Clinical Goal(s):  Over the next 60 days, Patient will verbalize understanding of plan for management of DMII as evidenced by decrease in A1C to less than 8,  through collaboration with RN Care manager, provider, and care team .   Interventions: Evaluation of current treatment plan related to  self management and patient's adherence to plan as established by provider   Diabetes Interventions:  (Status:  New goal.) Long Term Goal Assessed patient's understanding of A1c goal: <8% Provided education to patient about basic DM disease process Reviewed medications with patient and discussed importance of medication adherence Provided patient with written educational materials related to hypo and hyperglycemia and importance of correct treatment Advised patient, providing education and rationale, to check cbg at least twice a day and record, calling PCP for findings outside established parameters Assessed social determinant of health barriers Lab Results  Component Value Date   HGBA1C 8.2 (H) 11/22/2023    Patient Goals/Self-Care Activities: Take all medications as prescribed Call provider office for new concerns or questions  enter blood sugar readings and medication or insulin  into daily log drink 6 to 8 glasses of water  each day fill half of plate with vegetables Take fasting blood sugars in the morning, take blood sugar 2 hours post meal (goal of blood sugar less than 180mg /dL 2 hr post meal).    Follow Up Plan:  Telephone follow  up appointment with care management team member scheduled for:  Friday, April 18 at 10:00Am       Exercise 150 min/wk Moderate Activity     Pt walks outside every day.      HEMOGLOBIN A1C < 7.5     Weight (lb) < 200 lb (90.7 kg)        This is a list of the screening recommended for you and due dates:  Health Maintenance  Topic Date Due   Zoster (Shingles) Vaccine (1 of 2) 06/02/1965   Eye exam for diabetics  02/28/2024*   COVID-19 Vaccine (4 - 2024-25 season) 03/15/2024*   Flu Shot  04/14/2024   Yearly kidney health urinalysis for diabetes  05/05/2024   Complete foot exam   08/16/2024   Hemoglobin A1C  08/26/2024   Medicare Annual Wellness Visit  12/08/2024   Yearly kidney function blood test for diabetes  02/24/2025   DTaP/Tdap/Td vaccine (4 - Td or Tdap) 12/08/2033   Pneumococcal Vaccine for age over 82  Completed   Hepatitis C Screening  Completed   HPV Vaccine  Aged Out   Meningitis B Vaccine  Aged Out   Screening for Lung Cancer  Discontinued   Colon Cancer Screening  Discontinued  *Topic was postponed. The date shown is not the original due date.

## 2024-02-28 NOTE — Assessment & Plan Note (Signed)
 Referral placed back to GI for follow up

## 2024-02-28 NOTE — Assessment & Plan Note (Signed)
 A1c 7.1%. He is unsure of fasting readings but is only taking insulin  daily if BG >110. A1c and uACR UTD. Foot exam UTD. Vaccines UTD. Retinal eye exam upcoming. Recommend heart healthy diet such as Mediterranean diet with whole grains, fruits, vegetable, fish, lean meats, nuts, and olive oil. Limit salt. Encouraged moderate walking, 3-5 times/week for 30-50 minutes each session. Aim for at least 150 minutes.week. Goal should be pace of 3 miles/hours, or walking 1.5 miles in 30 minutes. Seek medical care for urinary frequency, extreme thirst, vision changes, lightheadedness, dizziness.  Follow up in 3 months or sooner if needed

## 2024-02-28 NOTE — Assessment & Plan Note (Addendum)
 Continue Atorvastatin . I recommend consuming a heart healthy diet such as Mediterranean diet or DASH diet with whole grains, fruits, vegetable, fish, lean meats, nuts, and olive oil. Limit sweets and processed foods. I also encourage moderate intensity exercise 150 minutes weekly. This is 3-5 times weekly for 30-50 minutes each session. Goal should be pace of 3 miles/hours, or walking 1.5 miles in 30 minutes.

## 2024-02-28 NOTE — Assessment & Plan Note (Signed)
 Well controlled on Wellbutrin

## 2024-02-28 NOTE — Assessment & Plan Note (Signed)
 Add on iron panel and collect stool card. Referral back to GI for evaluation. Start Ferrous Sulfate 325mg  every other day and repeat in 6-8 weeks

## 2024-03-01 LAB — CBC WITH DIFFERENTIAL/PLATELET
Absolute Lymphocytes: 1612 {cells}/uL (ref 850–3900)
Absolute Monocytes: 556 {cells}/uL (ref 200–950)
Basophils Absolute: 50 {cells}/uL (ref 0–200)
Basophils Relative: 0.9 %
Eosinophils Absolute: 160 {cells}/uL (ref 15–500)
Eosinophils Relative: 2.9 %
HCT: 36.4 % — ABNORMAL LOW (ref 38.5–50.0)
Hemoglobin: 11.6 g/dL — ABNORMAL LOW (ref 13.2–17.1)
MCH: 28.6 pg (ref 27.0–33.0)
MCHC: 31.9 g/dL — ABNORMAL LOW (ref 32.0–36.0)
MCV: 89.9 fL (ref 80.0–100.0)
MPV: 10.6 fL (ref 7.5–12.5)
Monocytes Relative: 10.1 %
Neutro Abs: 3124 {cells}/uL (ref 1500–7800)
Neutrophils Relative %: 56.8 %
Platelets: 224 10*3/uL (ref 140–400)
RBC: 4.05 10*6/uL — ABNORMAL LOW (ref 4.20–5.80)
RDW: 12.8 % (ref 11.0–15.0)
Total Lymphocyte: 29.3 %
WBC: 5.5 10*3/uL (ref 3.8–10.8)

## 2024-03-01 LAB — HEMOGLOBIN A1C
Hgb A1c MFr Bld: 7.1 % — ABNORMAL HIGH (ref <5.7)
Mean Plasma Glucose: 157 mg/dL
eAG (mmol/L): 8.7 mmol/L

## 2024-03-01 LAB — COMPLETE METABOLIC PANEL WITHOUT GFR
AG Ratio: 1.8 (calc) (ref 1.0–2.5)
ALT: 13 U/L (ref 9–46)
AST: 12 U/L (ref 10–35)
Albumin: 4.5 g/dL (ref 3.6–5.1)
Alkaline phosphatase (APISO): 53 U/L (ref 35–144)
BUN: 10 mg/dL (ref 7–25)
CO2: 27 mmol/L (ref 20–32)
Calcium: 9.6 mg/dL (ref 8.6–10.3)
Chloride: 99 mmol/L (ref 98–110)
Creat: 0.94 mg/dL (ref 0.70–1.28)
Globulin: 2.5 g/dL (ref 1.9–3.7)
Glucose, Bld: 119 mg/dL — ABNORMAL HIGH (ref 65–99)
Potassium: 4.3 mmol/L (ref 3.5–5.3)
Sodium: 137 mmol/L (ref 135–146)
Total Bilirubin: 0.4 mg/dL (ref 0.2–1.2)
Total Protein: 7 g/dL (ref 6.1–8.1)

## 2024-03-01 LAB — LIPID PANEL
Cholesterol: 142 mg/dL (ref <200)
HDL: 60 mg/dL (ref >=40)
LDL Cholesterol (Calc): 63 mg/dL
Non-HDL Cholesterol (Calc): 82 mg/dL (ref <130)
Total CHOL/HDL Ratio: 2.4 (calc) (ref <5.0)
Triglycerides: 100 mg/dL (ref <150)

## 2024-03-01 LAB — IRON,TIBC AND FERRITIN PANEL
%SAT: 28 % (ref 20–48)
Ferritin: 28 ng/mL (ref 24–380)
Iron: 81 ug/dL (ref 50–180)
TIBC: 292 ug/dL (ref 250–425)

## 2024-03-01 LAB — MAGNESIUM: Magnesium: 1.7 mg/dL (ref 1.5–2.5)

## 2024-03-01 LAB — B12 AND FOLATE PANEL
Folate: 20.6 ng/mL
Vitamin B-12: 754 pg/mL (ref 200–1100)

## 2024-03-01 LAB — TEST AUTHORIZATION

## 2024-03-02 ENCOUNTER — Telehealth: Payer: Self-pay

## 2024-03-02 NOTE — Telephone Encounter (Signed)
 Copied from CRM [id number]. Topic: Clinical - Prescription Issue >> Mar 02, 2024 10:21 AM Hassie Lint wrote: Reason for CRM: Patient states glycopyrrolate  (ROBINUL ) 2 MG tablet is not currently covered under his insurance. Looking for an alternative medicine or advice on if he can get it cheaper somewhere. Also states for him to get the sample that was offered the top needs to be filled out by the doctor, wants to know if that can be done.  Patient can be reached at [phone]

## 2024-03-02 NOTE — Telephone Encounter (Signed)
 Lvm to see what needs to be filled out.

## 2024-03-03 ENCOUNTER — Telehealth: Payer: Self-pay

## 2024-03-03 NOTE — Telephone Encounter (Signed)
 Copied from CRM [id number]. Topic: Clinical - Request for Lab/Test Order >> Mar 02, 2024  4:01 PM Star East wrote: Reason for CRM: Patient has a question about the stool sample specimen form, does the top part in blue need to be filled out by physician- [phone]

## 2024-03-05 ENCOUNTER — Encounter (HOSPITAL_BASED_OUTPATIENT_CLINIC_OR_DEPARTMENT_OTHER): Payer: Self-pay | Admitting: Emergency Medicine

## 2024-03-05 ENCOUNTER — Inpatient Hospital Stay (HOSPITAL_BASED_OUTPATIENT_CLINIC_OR_DEPARTMENT_OTHER)
Admission: EM | Admit: 2024-03-05 | Discharge: 2024-03-10 | DRG: 640 | Disposition: A | Discharge status: Home / Self Care | Attending: Internal Medicine | Admitting: Internal Medicine

## 2024-03-05 ENCOUNTER — Other Ambulatory Visit: Payer: Self-pay

## 2024-03-05 DIAGNOSIS — Z923 Personal history of irradiation: Secondary | ICD-10-CM

## 2024-03-05 DIAGNOSIS — Z794 Long term (current) use of insulin: Secondary | ICD-10-CM

## 2024-03-05 DIAGNOSIS — Z634 Disappearance and death of family member: Secondary | ICD-10-CM

## 2024-03-05 DIAGNOSIS — Z833 Family history of diabetes mellitus: Secondary | ICD-10-CM

## 2024-03-05 DIAGNOSIS — Z955 Presence of coronary angioplasty implant and graft: Secondary | ICD-10-CM

## 2024-03-05 DIAGNOSIS — I4510 Unspecified right bundle-branch block: Secondary | ICD-10-CM | POA: Diagnosis present

## 2024-03-05 DIAGNOSIS — E861 Hypovolemia: Secondary | ICD-10-CM | POA: Diagnosis present

## 2024-03-05 DIAGNOSIS — R001 Bradycardia, unspecified: Secondary | ICD-10-CM | POA: Diagnosis not present

## 2024-03-05 DIAGNOSIS — E039 Hypothyroidism, unspecified: Secondary | ICD-10-CM | POA: Diagnosis present

## 2024-03-05 DIAGNOSIS — E871 Hypo-osmolality and hyponatremia: Secondary | ICD-10-CM | POA: Diagnosis not present

## 2024-03-05 DIAGNOSIS — Z96641 Presence of right artificial hip joint: Secondary | ICD-10-CM | POA: Diagnosis present

## 2024-03-05 DIAGNOSIS — Z79899 Other long term (current) drug therapy: Secondary | ICD-10-CM

## 2024-03-05 DIAGNOSIS — R531 Weakness: Secondary | ICD-10-CM

## 2024-03-05 DIAGNOSIS — G4733 Obstructive sleep apnea (adult) (pediatric): Secondary | ICD-10-CM | POA: Diagnosis present

## 2024-03-05 DIAGNOSIS — Z7982 Long term (current) use of aspirin: Secondary | ICD-10-CM

## 2024-03-05 DIAGNOSIS — E785 Hyperlipidemia, unspecified: Secondary | ICD-10-CM | POA: Diagnosis present

## 2024-03-05 DIAGNOSIS — Z8546 Personal history of malignant neoplasm of prostate: Secondary | ICD-10-CM

## 2024-03-05 DIAGNOSIS — F32A Depression, unspecified: Secondary | ICD-10-CM | POA: Diagnosis present

## 2024-03-05 DIAGNOSIS — K219 Gastro-esophageal reflux disease without esophagitis: Secondary | ICD-10-CM | POA: Diagnosis present

## 2024-03-05 DIAGNOSIS — E11649 Type 2 diabetes mellitus with hypoglycemia without coma: Secondary | ICD-10-CM | POA: Diagnosis not present

## 2024-03-05 DIAGNOSIS — F4321 Adjustment disorder with depressed mood: Secondary | ICD-10-CM | POA: Diagnosis present

## 2024-03-05 DIAGNOSIS — K760 Fatty (change of) liver, not elsewhere classified: Secondary | ICD-10-CM | POA: Diagnosis present

## 2024-03-05 DIAGNOSIS — R571 Hypovolemic shock: Secondary | ICD-10-CM | POA: Diagnosis present

## 2024-03-05 DIAGNOSIS — I10 Essential (primary) hypertension: Secondary | ICD-10-CM | POA: Diagnosis present

## 2024-03-05 DIAGNOSIS — Z87891 Personal history of nicotine dependence: Secondary | ICD-10-CM

## 2024-03-05 DIAGNOSIS — I2510 Atherosclerotic heart disease of native coronary artery without angina pectoris: Secondary | ICD-10-CM | POA: Diagnosis present

## 2024-03-05 DIAGNOSIS — G4700 Insomnia, unspecified: Secondary | ICD-10-CM | POA: Diagnosis present

## 2024-03-05 DIAGNOSIS — Z7984 Long term (current) use of oral hypoglycemic drugs: Secondary | ICD-10-CM

## 2024-03-05 DIAGNOSIS — R5381 Other malaise: Secondary | ICD-10-CM | POA: Diagnosis present

## 2024-03-05 DIAGNOSIS — D649 Anemia, unspecified: Secondary | ICD-10-CM | POA: Diagnosis not present

## 2024-03-05 DIAGNOSIS — M6281 Muscle weakness (generalized): Secondary | ICD-10-CM | POA: Diagnosis present

## 2024-03-05 DIAGNOSIS — I959 Hypotension, unspecified: Secondary | ICD-10-CM | POA: Diagnosis not present

## 2024-03-05 DIAGNOSIS — E781 Pure hyperglyceridemia: Secondary | ICD-10-CM | POA: Diagnosis present

## 2024-03-05 DIAGNOSIS — E1142 Type 2 diabetes mellitus with diabetic polyneuropathy: Secondary | ICD-10-CM | POA: Diagnosis present

## 2024-03-05 LAB — URINALYSIS, ROUTINE W REFLEX MICROSCOPIC
Bilirubin Urine: NEGATIVE
Glucose, UA: NEGATIVE mg/dL
Hgb urine dipstick: NEGATIVE
Ketones, ur: NEGATIVE mg/dL
Leukocytes,Ua: NEGATIVE
Nitrite: NEGATIVE
Protein, ur: NEGATIVE mg/dL
Specific Gravity, Urine: 1.025 (ref 1.005–1.030)
pH: 6.5 (ref 5.0–8.0)

## 2024-03-05 LAB — SODIUM: Sodium: 119 mmol/L — CL (ref 135–145)

## 2024-03-05 LAB — COMPREHENSIVE METABOLIC PANEL WITH GFR
ALT: 15 U/L (ref 0–44)
AST: 20 U/L (ref 15–41)
Albumin: 4.8 g/dL (ref 3.5–5.0)
Alkaline Phosphatase: 66 U/L (ref 38–126)
Anion gap: 14 (ref 5–15)
BUN: 7 mg/dL — ABNORMAL LOW (ref 8–23)
CO2: 20 mmol/L — ABNORMAL LOW (ref 22–32)
Calcium: 9.5 mg/dL (ref 8.9–10.3)
Chloride: 83 mmol/L — ABNORMAL LOW (ref 98–111)
Creatinine, Ser: 0.88 mg/dL (ref 0.61–1.24)
GFR, Estimated: >60 mL/min (ref >60)
Glucose, Bld: 158 mg/dL — ABNORMAL HIGH (ref 70–99)
Potassium: 4.2 mmol/L (ref 3.5–5.1)
Sodium: 117 mmol/L — CL (ref 135–145)
Total Bilirubin: 0.5 mg/dL (ref 0.0–1.2)
Total Protein: 7.8 g/dL (ref 6.5–8.1)

## 2024-03-05 LAB — CBC
HCT: 36.8 % — ABNORMAL LOW (ref 39.0–52.0)
Hemoglobin: 13.2 g/dL (ref 13.0–17.0)
MCH: 28.8 pg (ref 26.0–34.0)
MCHC: 35.9 g/dL (ref 30.0–36.0)
MCV: 80.2 fL (ref 80.0–100.0)
Platelets: 286 10*3/uL (ref 150–400)
RBC: 4.59 MIL/uL (ref 4.22–5.81)
RDW: 12 % (ref 11.5–15.5)
WBC: 5.1 10*3/uL (ref 4.0–10.5)
nRBC: 0 % (ref 0.0–0.2)

## 2024-03-05 LAB — CBG MONITORING, ED: Glucose-Capillary: 166 mg/dL — ABNORMAL HIGH (ref 70–99)

## 2024-03-05 LAB — MAGNESIUM: Magnesium: 1.7 mg/dL (ref 1.7–2.4)

## 2024-03-05 LAB — OSMOLALITY: Osmolality: 249 mosm/kg — CL (ref 275–295)

## 2024-03-05 LAB — RESP PANEL BY RT-PCR (RSV, FLU A&B, COVID)  RVPGX2
Influenza A by PCR: NEGATIVE
Influenza B by PCR: NEGATIVE
Resp Syncytial Virus by PCR: NEGATIVE
SARS Coronavirus 2 by RT PCR: NEGATIVE

## 2024-03-05 LAB — BASIC METABOLIC PANEL WITH GFR
Anion gap: 12 (ref 5–15)
BUN: 7 mg/dL — ABNORMAL LOW (ref 8–23)
CO2: 20 mmol/L — ABNORMAL LOW (ref 22–32)
Calcium: 9.2 mg/dL (ref 8.9–10.3)
Chloride: 87 mmol/L — ABNORMAL LOW (ref 98–111)
Creatinine, Ser: 0.83 mg/dL (ref 0.61–1.24)
GFR, Estimated: >60 mL/min (ref >60)
Glucose, Bld: 104 mg/dL — ABNORMAL HIGH (ref 70–99)
Potassium: 4 mmol/L (ref 3.5–5.1)
Sodium: 119 mmol/L — CL (ref 135–145)

## 2024-03-05 LAB — ETHANOL: Alcohol, Ethyl (B): <15 mg/dL (ref <15)

## 2024-03-05 LAB — PHOSPHORUS: Phosphorus: 2.5 mg/dL (ref 2.5–4.6)

## 2024-03-05 LAB — TSH: TSH: 0.507 u[IU]/mL (ref 0.350–4.500)

## 2024-03-05 MED ORDER — ONDANSETRON HCL 4 MG/2ML IJ SOLN
4.0000 mg | Freq: Once | INTRAMUSCULAR | Status: AC
  Administered 2024-03-05: 4 mg via INTRAVENOUS
  Filled 2024-03-05: qty 2

## 2024-03-05 MED ORDER — ONDANSETRON 4 MG PO TBDP: 4.0000 mg | ORAL_TABLET | Freq: Once | ORAL | Status: DC

## 2024-03-05 MED ORDER — SODIUM CHLORIDE 3 % IV SOLN
600.0000 mL/h | Freq: Once | INTRAVENOUS | Status: AC
  Administered 2024-03-05: 600 mL/h via INTRAVENOUS

## 2024-03-05 MED ORDER — SODIUM CHLORIDE 3 % IV BOLUS
100.0000 mL | Freq: Once | INTRAVENOUS | Status: DC
  Filled 2024-03-05: qty 500

## 2024-03-05 MED ORDER — SODIUM CHLORIDE 0.9 % IV BOLUS
500.0000 mL | Freq: Once | INTRAVENOUS | Status: AC
  Administered 2024-03-05: 500 mL via INTRAVENOUS

## 2024-03-05 NOTE — ED Notes (Signed)
 Provider and Primary RN aware of Na 117.

## 2024-03-05 NOTE — ED Triage Notes (Addendum)
 Pt reports his glucose dropped to 78 overnight; sts this is low for him; he has felt weak since then, but was able to get glucose back up; hx of hyponatremia

## 2024-03-05 NOTE — ED Notes (Signed)
 This tech came by to see if pt had provided urine sample yet and he had not. Nurse was at bedside providing pt care. This tech will will return momentarily to see if pt can provide sample.

## 2024-03-05 NOTE — ED Notes (Signed)
 Fluid bolus stopped per provider request. Patient aware of need for urine, but does not have to pee. Call bell within reach and patient aware to call when urine sample is ready.

## 2024-03-05 NOTE — Progress Notes (Signed)
 Plan of Care Note for accepted transfer   Patient: Michael Guzman MRN: [id number]   DOA: 03/05/2024  Facility requesting transfer: [facility] Requesting Provider: Dr. Ula Reason for transfer: Severe hyponatremia Facility course: Michael Guzman is a 78 y.o. male, with past medical history of hyponatremia, coronary artery disease, hypertension, diabetes, history of prostate cancer presents to emergency room with complaint of weakness.    Presents to emergency room with complaint of 4 days of drowsiness, generalized weakness and feeling unwell.  He reports when he is walking he feels very weak and is having to take breaks.  He has not had any falls.  Family does not note any confusion.  He has had nausea without vomiting.  Denies any new medications.  Notes decreased appetite but no significant diet change.  Has had history of similar, told related to GI loss.   In the ER,Temperature was 97.2.Labs revealed Sodium of 117, CO2 20, Chloride 83 and glucose 158 with otherwise negative CMP. Magnesium  was 1.7 and CBC normal. Respiratory panel was negative.  EKG : NSR with rate of 78 with Right BBB.  The patient was given a bolus of 500 ml NS and a bolus of  hypertonic normal saline per nephrology recommendation.  Plan of care: The patient is accepted for admission to Progressive unit, at [facility]..  The patient will be under the care and responsibility of the EDP until arrival to [facility].  Author: Madison DELENA Peaches, MD 03/05/2024  Check [email or web address] for on-call coverage.  Nursing staff, Please call [facility] Admits & Consults System-Wide number on Amion as soon as patient's arrival, so appropriate admitting provider can evaluate the pt.

## 2024-03-05 NOTE — ED Notes (Signed)
 Brought family member drink.

## 2024-03-05 NOTE — ED Notes (Signed)
 This tech attempted to get urine sample from pt but pt could not void, pt stated they had previously voided before coming in. This tech will come check again later.

## 2024-03-05 NOTE — ED Provider Notes (Signed)
 [place] [facility] Provider Note   CSN: [id number] Arrival date & time: 03/05/24  1746     Patient presents with: Weakness   Michael Guzman is a 78 y.o. male.  With past medical history of hyponatremia, coronary artery disease, hypertension, diabetes, history of prostate cancer presents to emergency room with complaint of weakness.   Presents to emergency room with complaint of 4 days of drowsiness, generalized weakness and feeling unwell.  He reports when he is walking he feels very weak and is having to take breaks.  He has not had any falls.  Family does not note any confusion.  He has had nausea without vomiting.  Denies any new medications.  Notes decreased appetite but no significant diet change.  Has had history of similar, told related to GI loss.     Weakness      Prior to Admission medications   Medication Sig Start Date End Date Taking? Authorizing Provider  Ascorbic Acid (VITAMIN C) 1000 MG tablet Take 1,000 mg by mouth daily.    [provider]  aspirin  EC 81 MG tablet Take 1 tablet (81 mg total) by mouth daily. 10/27/19   [name], MD  atorvastatin  (LIPITOR ) 10 MG tablet TAKE 1 TABLET BY MOUTH DAILY 06/23/23   [name], [name], [name]  B Complex-C (SUPER B COMPLEX PO) Take 1 tablet by mouth daily.    [provider]  Blood Glucose Monitoring Suppl (ONE TOUCH ULTRA 2) w/Device KIT Use as directed 03/12/22   [name], [name], [name]  buPROPion  (WELLBUTRIN  XL) 300 MG 24 hr tablet TAKE 1 TABLET BY MOUTH DAILY FOR MOOD, FOCUS AND CONCENTRATION 06/16/23   Cranford, Tonya, NP  Cholecalciferol  (VITAMIN D3) 250 MCG (10000 UT) capsule Take 10,000 Units by mouth daily.    [provider]  gabapentin  (NEURONTIN ) 800 MG tablet TAKE 1 TABLET BY MOUTH 3 TO 4 TIMES DAILY FOR PAIN, HOT FLASHES AND SWEATS 01/17/24   Kayla Gauze S, [name]  glucose blood (ONETOUCH ULTRA) test strip CHECK BLOOD SUGAR 3 TIMES  DAILY 01/17/24    Kayla Gauze RAMAN, [name]  glycopyrrolate  (ROBINUL ) 2 MG tablet Take  1 tablet  3 x / day for Excessive Sweating  TAKE ONE TABLET BY                                                 MOUTH 02/28/24   Kayla Gauze RAMAN, [name]  insulin  NPH-regular Human (HUMULIN  70/30) (70-30) 100 UNIT/ML injection INJECT SUBCUTANEOUSLY 50  UNITS TWICE DAILY 01/17/24   Kayla Gauze RAMAN, [name]  Insulin  Syringes, Disposable, U-100 1 ML MISC 50 Units by Does not apply route 2 (two) times daily. inject 50 units into skin 2 x /day 07/23/20   [name], MD  isosorbide  mononitrate (IMDUR ) 30 MG 24 hr tablet Take 1 tablet (30 mg total) by mouth daily. 01/12/24   Kayla Gauze RAMAN, [name]  Lancets (ONETOUCH DELICA PLUS [place]) MISC CHECK BLOOD SUGAR 3 TIMES  DAILY 07/09/22   [name], MD  latanoprost  (XALATAN ) 0.005 % ophthalmic solution Place 1 drop into both eyes at bedtime. 02/24/24   [provider]  Magnesium  500 MG TABS Take 500 mg by mouth daily.    [provider]  metFORMIN  (GLUCOPHAGE -XR) 500 MG 24 hr tablet TAKE 2 TABLETS BY MOUTH  TWICE  DAILY WITH MEALS FOR DIABETES 06/23/23   [name], [name], [name]  metoprolol  succinate (TOPROL -XL) 25 MG 24 hr tablet TAKE ONE-HALF TABLET BY MOUTH  DAILY 10/19/23   [name], MD  montelukast  (SINGULAIR ) 10 MG tablet TAKE 1 TABLET BY MOUTH  DAILY FOR ALLERGIES 01/19/24   [name], [name]  nitroGLYCERIN  (NITROSTAT ) 0.4 MG SL tablet DISSOLVE 1 TABLET UNDER THE  TONGUE EVERY 5 MINUTES AS NEEDED FOR CHEST PAIN. MAX OF 3 TABLETS IN 15 MINUTES. CALL 911 IF PAIN  PERSISTS. 02/28/24   [name], [name]  pantoprazole  (PROTONIX ) 40 MG tablet TAKE 1 TABLET BY MOUTH  DAILY TO PREVENT HEARTBURN  AND INDIGESTION 01/19/24   [name], [name]  tamsulosin  (FLOMAX ) 0.4 MG CAPS capsule Take 0.4 mg by mouth at bedtime.    [provider]  Vitamin E 450 MG (1000 UT) CAPS Take 450 mg by mouth daily.    [provider]  zinc gluconate 50 MG tablet Take 50 mg by mouth  daily.    [provider]    Allergies: Patient has no known allergies.    Review of Systems  Neurological:  Positive for weakness.    Updated Vital Signs BP 128/89 (BP Location: Left Arm)   Pulse 73   Temp (!) 97.2 F (36.2 C) (Rectal)   Resp 16   Ht 5' 10 (1.778 m)   Wt 88 kg   SpO2 100%   BMI 27.84 kg/m   Physical Exam Vitals and nursing note reviewed.  Constitutional:      General: He is sleeping. He is not in acute distress.    Appearance: He is not toxic-appearing.  HENT:     Head: Normocephalic and atraumatic.   Eyes:     General: No scleral icterus.    Conjunctiva/sclera: Conjunctivae normal.    Cardiovascular:     Rate and Rhythm: Normal rate and regular rhythm.     Pulses: Normal pulses.     Heart sounds: Normal heart sounds.  Pulmonary:     Effort: Pulmonary effort is normal. No respiratory distress.     Breath sounds: Normal breath sounds.  Abdominal:     General: Abdomen is flat. Bowel sounds are normal.     Palpations: Abdomen is soft.     Tenderness: There is no abdominal tenderness.   Skin:    General: Skin is warm and dry.     Findings: No lesion.   Neurological:     General: No focal deficit present.     Mental Status: He is oriented to person, place, and time. Mental status is at baseline.     (all labs ordered are listed, but only abnormal results are displayed) Labs Reviewed  COMPREHENSIVE METABOLIC PANEL WITH GFR - Abnormal; Notable for the following components:      Result Value   Sodium 117 (*)    Chloride 83 (*)    CO2 20 (*)    Glucose, Bld 158 (*)    BUN 7 (*)    All other components within normal limits  CBC - Abnormal; Notable for the following components:   HCT 36.8 (*)    All other components within normal limits  CBG MONITORING, ED - Abnormal; Notable for the following components:   Glucose-Capillary 166 (*)    All other components within normal limits  RESP PANEL BY RT-PCR (RSV, FLU A&B, COVID)  [id number]   URINALYSIS, ROUTINE W REFLEX MICROSCOPIC  MAGNESIUM   TSH  OSMOLALITY  PHOSPHORUS  OSMOLALITY, URINE  CBG MONITORING, ED    EKG: EKG Interpretation Date/Time:  Sunday March 05 2024 18:03:58 EDT Ventricular Rate:  75 PR Interval:  150 QRS Duration:  160 QT Interval:  430 QTC Calculation: 481 R Axis:   72  Text Interpretation: Sinus rhythm Right bundle branch block Confirmed by Ula Barter [phone]) on 03/05/2024 6:19:57 PM  Radiology: No results found.   Procedures   Medications Ordered in the ED  ondansetron  (ZOFRAN ) injection 4 mg (4 mg Intravenous Given 03/05/24 1902)  sodium chloride  0.9 % bolus 500 mL (500 mLs Intravenous New Bag/Given 03/05/24 1907)    Clinical Course as of 03/05/24 2124  Sun Mar 05, 2024  1940 [facility] 100mL Hypertonic bolus. Will recheck BMP after.  [JB]  2020 Step down per critical care.  [JB]    Clinical Course User Index [JB] Perris Tripathi, Warren SAILOR, PA-C                                 Medical Decision Making Amount and/or Complexity of Data Reviewed Labs: ordered.  Risk Prescription drug management. Decision regarding hospitalization.   This patient presents to the ED for concern of weakness, this involves an extensive number of treatment options, and is a complaint that carries with it a high risk of complications and morbidity.  The differential diagnosis includes electrolyte abnormality, dehydration, atypical presentation of ACS, volume loss, medication side effect, urinary tract infection, viral URI   Co morbidities that complicate the patient evaluation  Hyponatremia, coronary artery disease, hypertension, diabetes, history of prostate cancer   Additional history obtained:  Additional history obtained from history of similar during admission stay 12/04/2023 when patient was presenting with hyponatremia suspected to be secondary to volume loss   Lab Tests:  I personally interpreted labs.  The pertinent results include:   CBC  with no leukocytosis.  CMP shows sodium of 117, no kidney or liver function abnormality Mag, phosphorus within normal limits Will obtain serum and urine osmolality as well as TSH.     Cardiac Monitoring: / EKG:  The patient was maintained on a cardiac monitor.     Consultations Obtained:  I requested consultation with the nephrology,  and discussed lab and imaging findings as well as pertinent plan -they felt 100 mm bolus of hypertonic saline would not be unreasonable to help with patient's symptoms.  I will consult the hospital team for admission. Discussed with critical care who recommend giving the 100 mL hypertonic saline solution and then rechecking sodium.  They do not feel he qualifies for admission to critical care.  They recommend consulting hospitalist admitting to stepdown. Discussed with hospitalist Dr. Lawence who agrees to admission.   Problem List / ED Course / Critical interventions / Medication management  Patient reports to emergency room with complaint of drowsiness, nausea with no vomiting and generalized weakness.  On my exam he has no focal neurological deficit.  He is alert and oriented.  He does appear quite drowsy.  His vitals are stable and he is well-appearing.  He has no medication changes.  He has no significant volume loss.  He reports he is not excessively drinking water .  Has history of similar but it was secondary to suspected volume loss, this dose not seem to be the case today.  His sodium is 117, added on serum osmolality.  Will consult for admission. After giving 100  mL of hypertonic solution, BMP was repeated showing Na 119 which has very mildly improved.  I ordered medication including zofran , NS Reevaluation of the patient after these medicines showed that the patient improved I have reviewed the patients home medicines and have made adjustments as needed         Final diagnoses:  Hyponatremia  Generalized weakness    ED Discharge Orders      None          Michael Guzman 03/05/24 2225    Ula Prentice SAUNDERS, MD 03/05/24 2325

## 2024-03-06 DIAGNOSIS — Z794 Long term (current) use of insulin: Secondary | ICD-10-CM | POA: Diagnosis not present

## 2024-03-06 DIAGNOSIS — D649 Anemia, unspecified: Secondary | ICD-10-CM | POA: Diagnosis not present

## 2024-03-06 DIAGNOSIS — R001 Bradycardia, unspecified: Secondary | ICD-10-CM | POA: Diagnosis not present

## 2024-03-06 DIAGNOSIS — I4510 Unspecified right bundle-branch block: Secondary | ICD-10-CM | POA: Diagnosis present

## 2024-03-06 DIAGNOSIS — R5381 Other malaise: Secondary | ICD-10-CM | POA: Diagnosis present

## 2024-03-06 DIAGNOSIS — I959 Hypotension, unspecified: Secondary | ICD-10-CM | POA: Diagnosis not present

## 2024-03-06 DIAGNOSIS — Z7984 Long term (current) use of oral hypoglycemic drugs: Secondary | ICD-10-CM | POA: Diagnosis not present

## 2024-03-06 DIAGNOSIS — F4321 Adjustment disorder with depressed mood: Secondary | ICD-10-CM | POA: Diagnosis present

## 2024-03-06 DIAGNOSIS — E1142 Type 2 diabetes mellitus with diabetic polyneuropathy: Secondary | ICD-10-CM | POA: Diagnosis present

## 2024-03-06 DIAGNOSIS — E871 Hypo-osmolality and hyponatremia: Secondary | ICD-10-CM

## 2024-03-06 DIAGNOSIS — E861 Hypovolemia: Secondary | ICD-10-CM | POA: Diagnosis present

## 2024-03-06 DIAGNOSIS — E781 Pure hyperglyceridemia: Secondary | ICD-10-CM | POA: Diagnosis present

## 2024-03-06 DIAGNOSIS — E039 Hypothyroidism, unspecified: Secondary | ICD-10-CM | POA: Diagnosis present

## 2024-03-06 DIAGNOSIS — F32A Depression, unspecified: Secondary | ICD-10-CM | POA: Diagnosis present

## 2024-03-06 DIAGNOSIS — E785 Hyperlipidemia, unspecified: Secondary | ICD-10-CM | POA: Diagnosis present

## 2024-03-06 DIAGNOSIS — E119 Type 2 diabetes mellitus without complications: Secondary | ICD-10-CM | POA: Diagnosis not present

## 2024-03-06 DIAGNOSIS — K760 Fatty (change of) liver, not elsewhere classified: Secondary | ICD-10-CM | POA: Diagnosis present

## 2024-03-06 DIAGNOSIS — K219 Gastro-esophageal reflux disease without esophagitis: Secondary | ICD-10-CM | POA: Diagnosis present

## 2024-03-06 DIAGNOSIS — M6281 Muscle weakness (generalized): Secondary | ICD-10-CM | POA: Diagnosis present

## 2024-03-06 DIAGNOSIS — R531 Weakness: Secondary | ICD-10-CM | POA: Diagnosis present

## 2024-03-06 DIAGNOSIS — I10 Essential (primary) hypertension: Secondary | ICD-10-CM | POA: Diagnosis present

## 2024-03-06 DIAGNOSIS — G4733 Obstructive sleep apnea (adult) (pediatric): Secondary | ICD-10-CM | POA: Diagnosis present

## 2024-03-06 DIAGNOSIS — E11649 Type 2 diabetes mellitus with hypoglycemia without coma: Secondary | ICD-10-CM | POA: Diagnosis not present

## 2024-03-06 DIAGNOSIS — G4700 Insomnia, unspecified: Secondary | ICD-10-CM | POA: Diagnosis present

## 2024-03-06 DIAGNOSIS — R571 Hypovolemic shock: Secondary | ICD-10-CM | POA: Diagnosis present

## 2024-03-06 DIAGNOSIS — I2510 Atherosclerotic heart disease of native coronary artery without angina pectoris: Secondary | ICD-10-CM | POA: Diagnosis present

## 2024-03-06 LAB — BASIC METABOLIC PANEL WITH GFR
Anion gap: 10 (ref 5–15)
BUN: <5 mg/dL — ABNORMAL LOW (ref 8–23)
CO2: 23 mmol/L (ref 22–32)
Calcium: 9.5 mg/dL (ref 8.9–10.3)
Chloride: 88 mmol/L — ABNORMAL LOW (ref 98–111)
Creatinine, Ser: 0.78 mg/dL (ref 0.61–1.24)
GFR, Estimated: >60 mL/min (ref >60)
Glucose, Bld: 121 mg/dL — ABNORMAL HIGH (ref 70–99)
Potassium: 4 mmol/L (ref 3.5–5.1)
Sodium: 121 mmol/L — ABNORMAL LOW (ref 135–145)

## 2024-03-06 LAB — SODIUM, URINE, RANDOM
Sodium, Ur: 91 mmol/L
Sodium, Ur: 74 mmol/L

## 2024-03-06 LAB — GLUCOSE, CAPILLARY
Glucose-Capillary: 127 mg/dL — ABNORMAL HIGH (ref 70–99)
Glucose-Capillary: 133 mg/dL — ABNORMAL HIGH (ref 70–99)

## 2024-03-06 LAB — OSMOLALITY, URINE
Osmolality, Ur: 504 mosm/kg (ref 300–900)
Osmolality, Ur: 351 mosm/kg (ref 300–900)

## 2024-03-06 LAB — BRAIN NATRIURETIC PEPTIDE: B Natriuretic Peptide: 11.5 pg/mL (ref 0.0–100.0)

## 2024-03-06 LAB — OSMOLALITY: Osmolality: 261 mosm/kg — ABNORMAL LOW (ref 275–295)

## 2024-03-06 LAB — CBG MONITORING, ED: Glucose-Capillary: 160 mg/dL — ABNORMAL HIGH (ref 70–99)

## 2024-03-06 LAB — SODIUM
Sodium: 118 mmol/L — CL (ref 135–145)
Sodium: 119 mmol/L — CL (ref 135–145)
Sodium: 122 mmol/L — ABNORMAL LOW (ref 135–145)
Sodium: 119 mmol/L — CL (ref 135–145)
Sodium: 119 mmol/L — CL (ref 135–145)

## 2024-03-06 MED ORDER — GABAPENTIN 400 MG PO CAPS
400.0000 mg | ORAL_CAPSULE | Freq: Three times a day (TID) | ORAL | Status: DC
  Administered 2024-03-06 – 2024-03-10 (×11): 400 mg via ORAL
  Filled 2024-03-06 (×4): qty 1
  Filled 2024-03-06: qty 4
  Filled 2024-03-06 (×3): qty 1
  Filled 2024-03-06: qty 4
  Filled 2024-03-06 (×4): qty 1
  Filled 2024-03-06: qty 4
  Filled 2024-03-06 (×2): qty 1
  Filled 2024-03-06: qty 4

## 2024-03-06 MED ORDER — ISOSORBIDE MONONITRATE ER 30 MG PO TB24
30.0000 mg | ORAL_TABLET | Freq: Every day | ORAL | Status: DC
  Administered 2024-03-06 – 2024-03-07 (×2): 30 mg via ORAL
  Filled 2024-03-06 (×2): qty 1

## 2024-03-06 MED ORDER — TAMSULOSIN HCL 0.4 MG PO CAPS
0.4000 mg | ORAL_CAPSULE | Freq: Every day | ORAL | Status: DC
  Administered 2024-03-06 – 2024-03-09 (×4): 0.4 mg via ORAL
  Filled 2024-03-06 (×4): qty 1

## 2024-03-06 MED ORDER — SODIUM CHLORIDE 0.9 % IV BOLUS
500.0000 mL | Freq: Once | INTRAVENOUS | Status: AC
  Administered 2024-03-06: 500 mL via INTRAVENOUS

## 2024-03-06 MED ORDER — ACETAMINOPHEN 500 MG PO TABS
1000.0000 mg | ORAL_TABLET | Freq: Four times a day (QID) | ORAL | Status: DC | PRN
  Administered 2024-03-06 – 2024-03-07 (×2): 1000 mg via ORAL
  Filled 2024-03-06 (×2): qty 2

## 2024-03-06 MED ORDER — SODIUM CHLORIDE 0.9 % IV SOLN: INTRAVENOUS | Status: DC

## 2024-03-06 MED ORDER — ATORVASTATIN CALCIUM 10 MG PO TABS
10.0000 mg | ORAL_TABLET | Freq: Every day | ORAL | Status: DC
  Administered 2024-03-06 – 2024-03-10 (×5): 10 mg via ORAL
  Filled 2024-03-06 (×5): qty 1

## 2024-03-06 MED ORDER — PANTOPRAZOLE SODIUM 40 MG PO TBEC
40.0000 mg | DELAYED_RELEASE_TABLET | Freq: Every day | ORAL | Status: DC
  Administered 2024-03-06 – 2024-03-10 (×5): 40 mg via ORAL
  Filled 2024-03-06 (×5): qty 1

## 2024-03-06 MED ORDER — INSULIN ASPART 100 UNIT/ML IJ SOLN
0.0000 [IU] | Freq: Three times a day (TID) | INTRAMUSCULAR | Status: DC
  Administered 2024-03-07: 3 [IU] via SUBCUTANEOUS

## 2024-03-06 MED ORDER — BUPROPION HCL ER (XL) 300 MG PO TB24
300.0000 mg | ORAL_TABLET | Freq: Every day | ORAL | Status: DC
  Administered 2024-03-06: 300 mg via ORAL
  Filled 2024-03-06: qty 2
  Filled 2024-03-06: qty 1

## 2024-03-06 MED ORDER — SODIUM CHLORIDE 1 G PO TABS
1.0000 g | ORAL_TABLET | Freq: Two times a day (BID) | ORAL | Status: DC
  Administered 2024-03-07 (×2): 1 g via ORAL
  Filled 2024-03-06 (×3): qty 1

## 2024-03-06 MED ORDER — ASPIRIN 81 MG PO TBEC
81.0000 mg | DELAYED_RELEASE_TABLET | Freq: Every day | ORAL | Status: DC
  Administered 2024-03-06 – 2024-03-10 (×5): 81 mg via ORAL
  Filled 2024-03-06 (×5): qty 1

## 2024-03-06 MED ORDER — METOPROLOL SUCCINATE 12.5 MG HALF TABLET
12.5000 mg | ORAL_TABLET | Freq: Every day | ORAL | Status: DC
  Administered 2024-03-06: 12.5 mg via ORAL
  Filled 2024-03-06 (×2): qty 1

## 2024-03-06 MED ORDER — MONTELUKAST SODIUM 10 MG PO TABS
10.0000 mg | ORAL_TABLET | Freq: Every day | ORAL | Status: DC
  Administered 2024-03-06 – 2024-03-09 (×4): 10 mg via ORAL
  Filled 2024-03-06 (×4): qty 1

## 2024-03-06 NOTE — ED Notes (Signed)
 Pt given crackers and gingerale.

## 2024-03-06 NOTE — Consult Note (Signed)
 Reason for Consult: Acute on chronic hyponatremia Referring Physician: Georgina, MD  Michael Guzman Carmin is an 78 y.o. male with a PMH significant for DM type 2, HTN, HLD, h/o prostate cancer, ASCAD s/p PTCA, OSA, fatty liver disease, GERD, and history of hyponatremia who presented to [facility] on 03/05/24 with a 4 day history of generalized weakness, drowsiness, fatigue, malaise, and nausea.  In the ED, Temp 97.2, Bp 128/89, HR 73, SpO2 100%.  Labs were notable for Na 117, Cl 83, Co2 20, BUN 7, gluc 166.  He was given a bolus of 500 mL of NS and 100 mL of 3% saline.  His sodium improved to 122 this morning and was transferred to [facility] for further evaluation.  We were consulted due to refractory hyponatremia.  The trend in serum sodium is seen below. Sosm 249, Uosm 504, UNa 91.  Although these were taken after he received 100 mL of 3% saline.  He is not on an SSRI or diuretic but is on Wellbutrin .  He reports that he drinks 2 bottles of Gatorade daily.  He also reported that this all started when his glucose dropped to 78 4 days ago and hasn't been able to bring it up.  He did have some nausea but no vomiting or diarrhea.  No edema, SOB, productive cough.  Trend in Serum Sodium: Sodium  Date/Time Value Ref Range Status  03/06/2024 02:12 PM 119 (LL) 135 - 145 mmol/L Final  03/06/2024 09:06 AM 122 (L) 135 - 145 mmol/L Final  03/06/2024 05:43 AM 119 (LL) 135 - 145 mmol/L Final  03/06/2024 01:45 AM 118 (LL) 135 - 145 mmol/L Final  03/05/2024 10:32 PM 119 (LL) 135 - 145 mmol/L Final  03/05/2024 08:43 PM 119 (LL) 135 - 145 mmol/L Final  03/05/2024 06:20 PM 117 (LL) 135 - 145 mmol/L Final  02/25/2024 11:11 AM 137 135 - 146 mmol/L Final  12/09/2023 09:32 AM 133 (L) 135 - 146 mmol/L Final  12/05/2023 08:17 AM 129 (L) 135 - 145 mmol/L Final  12/04/2023 04:59 PM 129 (L) 135 - 145 mmol/L Final  12/04/2023 02:52 AM 128 (L) 135 - 145 mmol/L Final  12/04/2023 02:51 AM 128 (L) 135 - 145 mmol/L Final   12/03/2023 08:44 PM 124 (L) 135 - 145 mmol/L Final  12/03/2023 01:09 PM 124 (L) 135 - 145 mmol/L Final  12/03/2023 04:52 AM 122 (L) 135 - 145 mmol/L Final  12/02/2023 09:38 PM 122 (L) 135 - 145 mmol/L Final  12/02/2023 03:34 PM 121 (L) 135 - 145 mmol/L Final  12/02/2023 10:00 AM 119 (LL) 135 - 145 mmol/L Final  12/02/2023 03:54 AM 121 (L) 135 - 145 mmol/L Final  12/01/2023 07:29 PM 118 (LL) 135 - 145 mmol/L Final  12/01/2023 02:16 PM 122 (L) 135 - 145 mmol/L Final  12/01/2023 08:39 AM 123 (L) 135 - 145 mmol/L Final  12/01/2023 02:12 AM 120 (L) 135 - 145 mmol/L Final  11/30/2023 07:16 PM 116 (LL) 135 - 145 mmol/L Final  11/22/2023 04:31 PM 136 135 - 146 mmol/L Final  11/13/2023 04:18 AM 129 (L) 135 - 145 mmol/L Final  11/12/2023 09:31 PM 125 (L) 135 - 145 mmol/L Final  11/12/2023 04:13 PM 123 (L) 135 - 145 mmol/L Final  11/12/2023 11:33 AM 122 (L) 135 - 145 mmol/L Final  11/12/2023 03:57 AM 123 (L) 135 - 145 mmol/L Final  11/11/2023 08:45 PM 123 (L) 135 - 145 mmol/L Final  11/11/2023 05:30 PM 122 (L) 135 -  145 mmol/L Final  11/11/2023 09:30 AM 119 (LL) 135 - 145 mmol/L Final  08/17/2023 10:54 AM 133 (L) 135 - 146 mmol/L Final  05/06/2023 03:01 PM 132 (L) 135 - 146 mmol/L Final  12/04/2022 11:28 AM 138 135 - 146 mmol/L Final  08/03/2022 12:00 AM 134 (L) 135 - 146 mmol/L Final  06/16/2022 03:39 PM 139 135 - 145 mEq/L Final  04/30/2022 03:06 PM 135 135 - 146 mmol/L Final  01/28/2022 04:44 PM 140 135 - 146 mmol/L Final  10/22/2021 10:25 AM 139 135 - 146 mmol/L Final  07/21/2021 11:41 AM 138 135 - 146 mmol/L Final  05/28/2021 03:20 PM 133 (L) 135 - 146 mmol/L Final  04/16/2021 10:12 AM 136 135 - 146 mmol/L Final  01/21/2021 09:23 AM 138 135 - 146 mmol/L Final  01/12/2021 04:15 AM 133 (L) 135 - 145 mmol/L Final  01/11/2021 02:33 PM 135 135 - 145 mmol/L Final  12/12/2020 11:01 AM 137 135 - 146 mmol/L Final  10/30/2020 10:39 AM 138 135 - 146 mmol/L Final  07/18/2020 12:36 PM 136 135 -  146 mmol/L Final  04/11/2020 10:15 AM 139 135 - 146 mmol/L Final    PMH:   Past Medical History:  Diagnosis Date   Arthritis    CAD (coronary artery disease)    a.s/p DES to mid LAD and OM2 06/2012.   Chronic back pain    Chronically dry eyes    Diabetes mellitus without complication (HCC)    TYPE 2    DKA (diabetic ketoacidoses) 01/30/2017   Elevated PSA    being monitored   Fatty liver    GERD (gastroesophageal reflux disease)    Hx of radiation therapy    prostate , [facility]    Hypertension    Hypertriglyceridemia    Intermediate coronary syndrome (HCC) 07/26/2012   Nausea vomiting and diarrhea 11/11/2023   Neuromuscular disorder (HCC)    neuropathy feet and legs   Prostate cancer (HCC)    RADTIATION     PSH:   Past Surgical History:  Procedure Laterality Date   APPENDECTOMY     CARDIAC SURGERY  ~2017   3 stent placed.   COLONOSCOPY  02/12/2010   [facility]   COLONOSCOPY WITH PROPOFOL  N/A 09/21/2022   Procedure: COLONOSCOPY WITH PROPOFOL ;  Surgeon: Wilhelmenia Aloha Raddle., MD;  Location: [facility];  Service: Gastroenterology;  Laterality: N/A;   ENDOSCOPIC MUCOSAL RESECTION N/A 09/21/2022   Procedure: ENDOSCOPIC MUCOSAL RESECTION;  Surgeon: Wilhelmenia Aloha Raddle., MD;  Location: [facility];  Service: Gastroenterology;  Laterality: N/A;   HEMOSTASIS CLIP PLACEMENT  09/21/2022   Procedure: HEMOSTASIS CLIP PLACEMENT;  Surgeon: Wilhelmenia Aloha Raddle., MD;  Location: [facility];  Service: Gastroenterology;;   LEFT HEART CATHETERIZATION WITH CORONARY ANGIOGRAM N/A 06/27/2012   Procedure: LEFT HEART CATHETERIZATION WITH CORONARY ANGIOGRAM;  Surgeon: Oneil Parchment, MD;  Location: [facility] C[facility] LAB;  Service: Cardiovascular;  Laterality: N/A;   LEFT HEART CATHETERIZATION WITH CORONARY ANGIOGRAM N/A 07/25/2012   Procedure: LEFT HEART CATHETERIZATION WITH CORONARY ANGIOGRAM;  Surgeon: Wilbert JONELLE Bihari, MD;  Location: [facility] CATH LAB;  Service: Cardiovascular;  Laterality:  N/A;   PROSTATE BIOPSY     RADIOLOGY WITH ANESTHESIA N/A 12/03/2015   Procedure: MRI LUMBAR SPINE;  Surgeon: Medication Radiologist, MD;  Location: [facility] OR;  Service: Radiology;  Laterality: N/A;   SUBMUCOSAL LIFTING INJECTION  09/21/2022   Procedure: SUBMUCOSAL LIFTING INJECTION;  Surgeon: Wilhelmenia Aloha Raddle., MD;  Location: [facility];  Service: Gastroenterology;;   TOTAL  HIP ARTHROPLASTY Right 04/28/2019   Procedure: TOTAL HIP ARTHROPLASTY ANTERIOR APPROACH;  Surgeon: Yvone Rush, MD;  Location: [facility];  Service: Orthopedics;  Laterality: Right;    Allergies: No Known Allergies  Medications:   Prior to Admission medications   Medication Sig Start Date End Date Taking? Authorizing Provider  Ascorbic Acid (VITAMIN C) 1000 MG tablet Take 1,000 mg by mouth daily.    [provider]  aspirin  EC 81 MG tablet Take 1 tablet (81 mg total) by mouth daily. 10/27/19   [name], MD  atorvastatin  (LIPITOR ) 10 MG tablet TAKE 1 TABLET BY MOUTH DAILY 06/23/23   [name], [name], [name]  B Complex-C (SUPER B COMPLEX PO) Take 1 tablet by mouth daily.    [provider]  Blood Glucose Monitoring Suppl (ONE TOUCH ULTRA 2) w/Device KIT Use as directed 03/12/22   [name], [name], [name]  buPROPion  (WELLBUTRIN  XL) 300 MG 24 hr tablet TAKE 1 TABLET BY MOUTH DAILY FOR MOOD, FOCUS AND CONCENTRATION 06/16/23   Laurice President, NP  Cholecalciferol  (VITAMIN D3) 250 MCG (10000 UT) capsule Take 10,000 Units by mouth daily.    [provider]  gabapentin  (NEURONTIN ) 800 MG tablet TAKE 1 TABLET BY MOUTH 3 TO 4 TIMES DAILY FOR PAIN, HOT FLASHES AND SWEATS 01/17/24   Kayla Gauze S, [name]  glucose blood (ONETOUCH ULTRA) test strip CHECK BLOOD SUGAR 3 TIMES  DAILY 01/17/24   Kayla Gauze RAMAN, [name]  glycopyrrolate  (ROBINUL ) 2 MG tablet Take  1 tablet  3 x / day for Excessive Sweating  TAKE ONE TABLET BY                                                 MOUTH 02/28/24   Kayla Gauze RAMAN, [name]  insulin   NPH-regular Human (HUMULIN  70/30) (70-30) 100 UNIT/ML injection INJECT SUBCUTANEOUSLY 50  UNITS TWICE DAILY 01/17/24   Kayla Gauze RAMAN, [name]  Insulin  Syringes, Disposable, U-100 1 ML MISC 50 Units by Does not apply route 2 (two) times daily. inject 50 units into skin 2 x /day 07/23/20   [name], MD  isosorbide  mononitrate (IMDUR ) 30 MG 24 hr tablet Take 1 tablet (30 mg total) by mouth daily. 01/12/24   Kayla Gauze RAMAN, [name]  Lancets (ONETOUCH DELICA PLUS [place]) MISC CHECK BLOOD SUGAR 3 TIMES  DAILY 07/09/22   [name], MD  latanoprost  (XALATAN ) 0.005 % ophthalmic solution Place 1 drop into both eyes at bedtime. 02/24/24   [provider]  Magnesium  500 MG TABS Take 500 mg by mouth daily.    [provider]  metFORMIN  (GLUCOPHAGE -XR) 500 MG 24 hr tablet TAKE 2 TABLETS BY MOUTH TWICE  DAILY WITH MEALS FOR DIABETES 06/23/23   [name], [name], [name]  metoprolol  succinate (TOPROL -XL) 25 MG 24 hr tablet TAKE ONE-HALF TABLET BY MOUTH  DAILY 10/19/23   [name], MD  montelukast  (SINGULAIR ) 10 MG tablet TAKE 1 TABLET BY MOUTH  DAILY FOR ALLERGIES 01/19/24   Kayla Gauze RAMAN, [name]  nitroGLYCERIN  (NITROSTAT ) 0.4 MG SL tablet DISSOLVE 1 TABLET UNDER THE  TONGUE EVERY 5 MINUTES AS NEEDED FOR CHEST PAIN. MAX OF 3 TABLETS IN 15 MINUTES. CALL 911 IF PAIN  PERSISTS. 02/28/24   Kayla Gauze RAMAN, [name]  pantoprazole  (PROTONIX ) 40 MG tablet TAKE 1 TABLET BY MOUTH  DAILY TO PREVENT  HEARTBURN  AND INDIGESTION 01/19/24   [name], [name]  tamsulosin  (FLOMAX ) 0.4 MG CAPS capsule Take 0.4 mg by mouth at bedtime.    [provider]  Vitamin E 450 MG (1000 UT) CAPS Take 450 mg by mouth daily.    [provider]  zinc gluconate 50 MG tablet Take 50 mg by mouth daily.    [provider]    Discontinued Meds:   Medications Discontinued During This Encounter  Medication Reason   ondansetron  (ZOFRAN -ODT) disintegrating tablet 4 mg    sodium chloride  3% (hypertonic) IV  bolus 100 mL     Social History:  reports that he quit smoking about 12 years ago. His smoking use included cigarettes. He started smoking about 65 years ago. He has a 39.6 pack-year smoking history. He has never used smokeless tobacco. He reports that he does not currently use alcohol. He reports that he does not use drugs.  Family History:   Family History  Problem Relation Age of Onset   Diabetes Sister    Stomach cancer Neg Hx    Colon cancer Neg Hx    Esophageal cancer Neg Hx    Inflammatory bowel disease Neg Hx    Liver disease Neg Hx    Pancreatic cancer Neg Hx    Rectal cancer Neg Hx     Pertinent items are noted in HPI.  Blood pressure (!) 153/91, pulse 68, temperature 98.2 F (36.8 C), temperature source Oral, resp. rate 16, height 5' 10 (1.778 m), weight 84.7 kg, SpO2 100%. General appearance: alert, cooperative, and no distress Head: Normocephalic, without obvious abnormality, atraumatic Eyes: negative findings: lids and lashes normal, conjunctivae and sclerae normal, and corneas clear Resp: clear to auscultation bilaterally Cardio: regular rate and rhythm, S1, S2 normal, no murmur, click, rub or gallop GI: soft, non-tender; bowel sounds normal; no masses,  no organomegaly Extremities: extremities normal, atraumatic, no cyanosis or edema  Labs: Basic Metabolic Panel: Recent Labs  Lab 03/05/24 1820 03/05/24 1911 03/05/24 2043 03/05/24 2232 03/06/24 0145 03/06/24 0543 03/06/24 0906 03/06/24 1412  NA 117*  --  119* 119* 118* 119* 122* 119*  K 4.2  --  4.0  --   --   --   --   --   CL 83*  --  87*  --   --   --   --   --   CO2 20*  --  20*  --   --   --   --   --   GLUCOSE 158*  --  104*  --   --   --   --   --   BUN 7*  --  7*  --   --   --   --   --   CREATININE 0.88  --  0.83  --   --   --   --   --   ALBUMIN 4.8  --   --   --   --   --   --   --   CALCIUM  9.5  --  9.2  --   --   --   --   --   PHOS  --  2.5  --   --   --   --   --   --    Liver  Function Tests: Recent Labs  Lab 03/05/24 1820  AST 20  ALT 15  ALKPHOS 66  BILITOT 0.5  PROT 7.8  ALBUMIN  4.8   No results for input(s): LIPASE, AMYLASE in the last 168 hours. No results for input(s): AMMONIA in the last 168 hours. CBC: Recent Labs  Lab 03/05/24 1820  WBC 5.1  HGB 13.2  HCT 36.8*  MCV 80.2  PLT 286   PT/INR: @labrcntip (inr:5) Cardiac Enzymes: No results for input(s): CKTOTAL, CKMB, CKMBINDEX, TROPONINI in the last 168 hours. CBG: Recent Labs  Lab 03/05/24 1755 03/06/24 0144  GLUCAP 166* 160*    Iron Studies: No results for input(s): IRON, TIBC, TRANSFERRIN, FERRITIN in the last 168 hours.  Xrays/Other Studies: No results found.   Assessment/Plan:  Acute on chronic hyponatremia - he has been hospitalized 3 times in the past 4 months with similar presentation, although he had N/V/D on the previous 2.  Sosm low at 249, Uosm high at 504 and UNa high at 91, however these were drawn 2 hours after receiving 500 mL of IV NS and 100 mL of 3% saline.  Will recheck now.  He is currently asymptomatic.  Will recheck sodium level now and if at 119 or less, would recommend transfer to stepdown unit for 150 mL of 3% saline bolus and continue to follow sodium levels.  Given hypoglycemia, will check cortisol in the am.  TSH was WNL.  No SSRI but is on Wellbutrin  (although rarely causes hyponatremia).  Concerning that this is a recurring issue.  NaCl tabs ordered for now but would stop this if he needs 3% saline as above.  He is currently asymptomatic.  Hypoglycemia - hold metformin  for now and check cortisol as above. HTN - Bp was low yesterday but responded to IVF. DM type 2 - per primary.  Hold metformin  as above.    [facility] LABOR Markia Kyer 03/06/2024, 4:46 PM

## 2024-03-06 NOTE — ED Notes (Signed)
 Called [facility] for transport to [facility].  Spoke with Luke

## 2024-03-06 NOTE — H&P (Signed)
 History and Physical    Patient: Michael Guzman FMW:[id number] DOB: 02/23/1946 DOA: 03/05/2024 DOS: the patient was seen and examined on 03/06/2024 PCP: [name], [name]  Patient coming from: Home  Chief Complaint:  Chief Complaint  Patient presents with   Weakness   HPI: Michael Guzman is a 78 y.o. male with medical history significant of CAD s/p DES to LAD/OM2 in 2013, recurrent hyponatremia, HTN, HLD, DM2 and GERD p/w hyponatremia.  Pt states that he was in his USOH until this past week when he began to have unprovoked fatigue, which was concerning to him since this was the primary symptom of his prior episodes of hyponatremia. Of note, he did not have n/v/d like his previous episodes. He tried to prevent coming to the hospital by increasing his Gatorade intake, but despite his best efforts he continued to have decreased energy; as such, he presented to the ED at the behest of his children.  In the ED, pt hypertensive. Labs notable for Na 118-->119-->122-->119, Cr 0.83, and osmolality 249. Pt admitted to medicine with renal following.  Review of Systems: As mentioned in the history of present illness. All other systems reviewed and are negative. Past Medical History:  Diagnosis Date   Arthritis    CAD (coronary artery disease)    a.s/p DES to mid LAD and OM2 06/2012.   Chronic back pain    Chronically dry eyes    Diabetes mellitus without complication (HCC)    TYPE 2    DKA (diabetic ketoacidoses) 01/30/2017   Elevated PSA    being monitored   Fatty liver    GERD (gastroesophageal reflux disease)    Hx of radiation therapy    prostate , [facility]    Hypertension    Hypertriglyceridemia    Intermediate coronary syndrome (HCC) 07/26/2012   Nausea vomiting and diarrhea 11/11/2023   Neuromuscular disorder (HCC)    neuropathy feet and legs   Prostate cancer (HCC)    RADTIATION    Past Surgical History:  Procedure Laterality Date   APPENDECTOMY      CARDIAC SURGERY  ~2017   3 stent placed.   COLONOSCOPY  02/12/2010   Buccini   COLONOSCOPY WITH PROPOFOL  N/A 09/21/2022   Procedure: COLONOSCOPY WITH PROPOFOL ;  Surgeon: Mansouraty, Aloha Raddle., MD;  Location: [facility];  Service: Gastroenterology;  Laterality: N/A;   ENDOSCOPIC MUCOSAL RESECTION N/A 09/21/2022   Procedure: ENDOSCOPIC MUCOSAL RESECTION;  Surgeon: Wilhelmenia Aloha Raddle., MD;  Location: [facility];  Service: Gastroenterology;  Laterality: N/A;   HEMOSTASIS CLIP PLACEMENT  09/21/2022   Procedure: HEMOSTASIS CLIP PLACEMENT;  Surgeon: Wilhelmenia Aloha Raddle., MD;  Location: [facility];  Service: Gastroenterology;;   LEFT HEART CATHETERIZATION WITH CORONARY ANGIOGRAM N/A 06/27/2012   Procedure: LEFT HEART CATHETERIZATION WITH CORONARY ANGIOGRAM;  Surgeon: Oneil Parchment, MD;  Location: [facility] CATH LAB;  Service: Cardiovascular;  Laterality: N/A;   LEFT HEART CATHETERIZATION WITH CORONARY ANGIOGRAM N/A 07/25/2012   Procedure: LEFT HEART CATHETERIZATION WITH CORONARY ANGIOGRAM;  Surgeon: Wilbert JONELLE Bihari, MD;  Location: [facility] CATH [facility];  Service: Cardiovascular;  Laterality: N/A;   PROSTATE BIOPSY     RADIOLOGY WITH ANESTHESIA N/A 12/03/2015   Procedure: MRI LUMBAR SPINE;  Surgeon: Medication Radiologist, MD;  Location: [facility];  Service: Radiology;  Laterality: N/A;   SUBMUCOSAL LIFTING INJECTION  09/21/2022   Procedure: SUBMUCOSAL LIFTING INJECTION;  Surgeon: Wilhelmenia Aloha Raddle., MD;  Location: [facility];  Service: Gastroenterology;;   TOTAL HIP ARTHROPLASTY Right 04/28/2019  Procedure: TOTAL HIP ARTHROPLASTY ANTERIOR APPROACH;  Surgeon: Yvone Rush, MD;  Location: [facility];  Service: Orthopedics;  Laterality: Right;   Social History:  reports that he quit smoking about 12 years ago. His smoking use included cigarettes. He started smoking about 65 years ago. He has a 39.6 pack-year smoking history. He has never used smokeless tobacco. He reports that he does not currently use alcohol. He  reports that he does not use drugs.  No Known Allergies  Family History  Problem Relation Age of Onset   Diabetes Sister    Stomach cancer Neg Hx    Colon cancer Neg Hx    Esophageal cancer Neg Hx    Inflammatory bowel disease Neg Hx    Liver disease Neg Hx    Pancreatic cancer Neg Hx    Rectal cancer Neg Hx     Prior to Admission medications   Medication Sig Start Date End Date Taking? Authorizing Provider  Ascorbic Acid (VITAMIN C) 1000 MG tablet Take 1,000 mg by mouth daily.    [provider]  aspirin  EC 81 MG tablet Take 1 tablet (81 mg total) by mouth daily. 10/27/19   [name], MD  atorvastatin  (LIPITOR ) 10 MG tablet TAKE 1 TABLET BY MOUTH DAILY 06/23/23   [name], [name], [name]  B Complex-C (SUPER B COMPLEX PO) Take 1 tablet by mouth daily.    [provider]  Blood Glucose Monitoring Suppl (ONE TOUCH ULTRA 2) w/Device KIT Use as directed 03/12/22   [name], [name], [name]  buPROPion  (WELLBUTRIN  XL) 300 MG 24 hr tablet TAKE 1 TABLET BY MOUTH DAILY FOR MOOD, FOCUS AND CONCENTRATION 06/16/23   Laurice President, NP  Cholecalciferol  (VITAMIN D3) 250 MCG (10000 UT) capsule Take 10,000 Units by mouth daily.    [provider]  gabapentin  (NEURONTIN ) 800 MG tablet TAKE 1 TABLET BY MOUTH 3 TO 4 TIMES DAILY FOR PAIN, HOT FLASHES AND SWEATS 01/17/24   [name], [name]  glucose blood (ONETOUCH ULTRA) test strip CHECK BLOOD SUGAR 3 TIMES  DAILY 01/17/24   [name], [name]  glycopyrrolate  (ROBINUL ) 2 MG tablet Take  1 tablet  3 x / day for Excessive Sweating  TAKE ONE TABLET BY                                                 MOUTH 02/28/24   [name], [name]  insulin  NPH-regular Human (HUMULIN  70/30) (70-30) 100 UNIT/ML injection INJECT SUBCUTANEOUSLY 50  UNITS TWICE DAILY 01/17/24   [name], [name]  Insulin  Syringes, Disposable, U-100 1 ML MISC 50 Units by Does not apply route 2 (two) times daily. inject 50 units into skin 2 x /day 07/23/20   [name], MD  isosorbide  mononitrate (IMDUR ) 30 MG 24 hr tablet Take 1 tablet (30 mg total) by mouth daily. 01/12/24   [name], [name]  Lancets (ONETOUCH DELICA PLUS [place]) MISC CHECK BLOOD SUGAR 3 TIMES  DAILY 07/09/22   [name], MD  latanoprost  (XALATAN ) 0.005 % ophthalmic solution Place 1 drop into both eyes at bedtime. 02/24/24   [provider]  Magnesium  500 MG TABS Take 500 mg by mouth daily.    [provider]  metFORMIN  (GLUCOPHAGE -XR) 500 MG 24 hr tablet TAKE 2 TABLETS BY MOUTH TWICE  DAILY WITH MEALS  FOR DIABETES 06/23/23   [name], [name], [name]  metoprolol  succinate (TOPROL -XL) 25 MG 24 hr tablet TAKE ONE-HALF TABLET BY MOUTH  DAILY 10/19/23   [name], MD  montelukast  (SINGULAIR ) 10 MG tablet TAKE 1 TABLET BY MOUTH  DAILY FOR ALLERGIES 01/19/24   [name], [name]  nitroGLYCERIN  (NITROSTAT ) 0.4 MG SL tablet DISSOLVE 1 TABLET UNDER THE  TONGUE EVERY 5 MINUTES AS NEEDED FOR CHEST PAIN. MAX OF 3 TABLETS IN 15 MINUTES. CALL 911 IF PAIN  PERSISTS. 02/28/24   [name], [name]  pantoprazole  (PROTONIX ) 40 MG tablet TAKE 1 TABLET BY MOUTH  DAILY TO PREVENT HEARTBURN  AND INDIGESTION 01/19/24   [name], [name]  tamsulosin  (FLOMAX ) 0.4 MG CAPS capsule Take 0.4 mg by mouth at bedtime.    [provider]  Vitamin E 450 MG (1000 UT) CAPS Take 450 mg by mouth daily.    [provider]  zinc gluconate 50 MG tablet Take 50 mg by mouth daily.    [provider]    Physical Exam: Vitals:   03/06/24 1340 03/06/24 1413 03/06/24 1415 03/06/24 1521  BP:   (!) 146/91 (!) 153/91  Pulse:  66  68  Resp:  16 13 16   Temp: 98.1 F (36.7 C) 98.2 F (36.8 C)  98.2 F (36.8 C)  TempSrc: Oral   Oral  SpO2:  100%  100%  Weight:    84.7 kg  Height:    5' 10 (1.778 m)   General: Alert, oriented x3, resting comfortably in no acute distress Respiratory: Lungs clear to auscultation bilaterally with normal respiratory effort; no  w/r/r Cardiovascular: Regular rate and rhythm w/o m/r/g   Data Reviewed:  Lab Results  Component Value Date   WBC 5.1 03/05/2024   HGB 13.2 03/05/2024   HCT 36.8 (L) 03/05/2024   MCV 80.2 03/05/2024   PLT 286 03/05/2024   Lab Results  Component Value Date   GLUCOSE 104 (H) 03/05/2024   CALCIUM  9.2 03/05/2024   NA 119 (LL) 03/06/2024   K 4.0 03/05/2024   CO2 20 (L) 03/05/2024   CL 87 (L) 03/05/2024   BUN 7 (L) 03/05/2024   CREATININE 0.83 03/05/2024   Lab Results  Component Value Date   ALT 15 03/05/2024   AST 20 03/05/2024   ALKPHOS 66 03/05/2024   BILITOT 0.5 03/05/2024   Lab Results  Component Value Date   INR 1.0 06/16/2022   INR 1.0 01/11/2021   INR 1.0 04/28/2019    Radiology: No results found.  Assessment and Plan: 18M h/o CAD s/p DES to LAD/OM2 in 2013, recurrent hyponatremia, HTN, HLD, DM2 and GERD p/w hyponatremia.  SIADH Recurrent hyponatremia Urine osm >100 (540), Serum osm <270 (249), urine Na 91, serum Na 119 -Renal consulted; apprec eval/recs -Current Na 119; goal Na increase <25mEq/d to prevent ODS -Fluid restrict to <1L/day -F/u BMP q4h for now -For overcorrection, administer IV desmopressin 2mcg q8h prn   Advance Care Planning:   Code Status: Prior   Consults: Renal  Family Communication: N/A   ------- I spent 55 minutes reviewing previous labs/notes, obtaining separate history at the bedside, counseling/discussing the treatment plan outlined above, ordering medications/tests, and performing clinical documentation.  Severity of Illness: The appropriate patient status for this patient is INPATIENT. Inpatient status is judged to be reasonable and necessary in order to provide the required intensity of service to ensure the patient's safety. The patient's presenting symptoms, physical exam findings, and  initial radiographic and laboratory data in the context of their chronic comorbidities is felt to place them at high risk for further  clinical deterioration. Furthermore, it is not anticipated that the patient will be medically stable for discharge from the hospital within 2 midnights of admission.   * I certify that at the point of admission it is my clinical judgment that the patient will require inpatient hospital care spanning beyond 2 midnights from the point of admission due to high intensity of service, high risk for further deterioration and high frequency of surveillance required.*  Author: Marsha Ada, MD 03/06/2024 4:14 PM  For on call review [email or web address].

## 2024-03-06 NOTE — ED Notes (Signed)
 CAlled grandaughter and gave her pts room number she states she will tell her mom

## 2024-03-06 NOTE — ED Notes (Signed)
 Report to 3 eats 03 nurse [place]

## 2024-03-06 NOTE — ED Notes (Signed)
 Pt given TV dinner and 4 OZ of gatorade.

## 2024-03-07 DIAGNOSIS — G4700 Insomnia, unspecified: Secondary | ICD-10-CM | POA: Diagnosis not present

## 2024-03-07 DIAGNOSIS — E119 Type 2 diabetes mellitus without complications: Secondary | ICD-10-CM | POA: Diagnosis not present

## 2024-03-07 DIAGNOSIS — D509 Iron deficiency anemia, unspecified: Secondary | ICD-10-CM

## 2024-03-07 DIAGNOSIS — E871 Hypo-osmolality and hyponatremia: Secondary | ICD-10-CM | POA: Diagnosis not present

## 2024-03-07 DIAGNOSIS — I959 Hypotension, unspecified: Secondary | ICD-10-CM | POA: Diagnosis not present

## 2024-03-07 LAB — CBC
HCT: 33 % — ABNORMAL LOW (ref 39.0–52.0)
HCT: 32.2 % — ABNORMAL LOW (ref 39.0–52.0)
Hemoglobin: 11.5 g/dL — ABNORMAL LOW (ref 13.0–17.0)
Hemoglobin: 11.2 g/dL — ABNORMAL LOW (ref 13.0–17.0)
MCH: 29 pg (ref 26.0–34.0)
MCH: 28.9 pg (ref 26.0–34.0)
MCHC: 34.8 g/dL (ref 30.0–36.0)
MCHC: 34.8 g/dL (ref 30.0–36.0)
MCV: 83.3 fL (ref 80.0–100.0)
MCV: 83.2 fL (ref 80.0–100.0)
Platelets: 246 10*3/uL (ref 150–400)
Platelets: 262 10*3/uL (ref 150–400)
RBC: 3.96 MIL/uL — ABNORMAL LOW (ref 4.22–5.81)
RBC: 3.87 MIL/uL — ABNORMAL LOW (ref 4.22–5.81)
RDW: 12.1 % (ref 11.5–15.5)
RDW: 12.2 % (ref 11.5–15.5)
WBC: 5.5 10*3/uL (ref 4.0–10.5)
WBC: 5.7 10*3/uL (ref 4.0–10.5)
nRBC: 0 % (ref 0.0–0.2)
nRBC: 0 % (ref 0.0–0.2)

## 2024-03-07 LAB — GLUCOSE, CAPILLARY
Glucose-Capillary: 185 mg/dL — ABNORMAL HIGH (ref 70–99)
Glucose-Capillary: 214 mg/dL — ABNORMAL HIGH (ref 70–99)
Glucose-Capillary: 124 mg/dL — ABNORMAL HIGH (ref 70–99)
Glucose-Capillary: 120 mg/dL — ABNORMAL HIGH (ref 70–99)

## 2024-03-07 LAB — MRSA NEXT GEN BY PCR, NASAL: MRSA by PCR Next Gen: NOT DETECTED

## 2024-03-07 LAB — SODIUM
Sodium: 118 mmol/L — CL (ref 135–145)
Sodium: 117 mmol/L — CL (ref 135–145)
Sodium: 121 mmol/L — ABNORMAL LOW (ref 135–145)
Sodium: 126 mmol/L — ABNORMAL LOW (ref 135–145)
Sodium: 126 mmol/L — ABNORMAL LOW (ref 135–145)
Sodium: 129 mmol/L — ABNORMAL LOW (ref 135–145)
Sodium: 132 mmol/L — ABNORMAL LOW (ref 135–145)

## 2024-03-07 LAB — MAGNESIUM: Magnesium: 1.8 mg/dL (ref 1.7–2.4)

## 2024-03-07 LAB — CREATININE, SERUM
Creatinine, Ser: 0.84 mg/dL (ref 0.61–1.24)
GFR, Estimated: >60 mL/min (ref >60)

## 2024-03-07 LAB — CORTISOL-AM, BLOOD: Cortisol - AM: 14.5 ug/dL (ref 6.7–22.6)

## 2024-03-07 MED ORDER — DEXTROSE 5 % IV BOLUS
250.0000 mL | Freq: Once | INTRAVENOUS | Status: AC
  Administered 2024-03-07: 250 mL via INTRAVENOUS

## 2024-03-07 MED ORDER — MELATONIN 3 MG PO TABS
3.0000 mg | ORAL_TABLET | Freq: Every day | ORAL | Status: DC
  Administered 2024-03-07: 3 mg via ORAL
  Filled 2024-03-07: qty 1

## 2024-03-07 MED ORDER — INSULIN ASPART 100 UNIT/ML IJ SOLN
0.0000 [IU] | INTRAMUSCULAR | Status: DC
  Administered 2024-03-07: 2 [IU] via SUBCUTANEOUS

## 2024-03-07 MED ORDER — NOREPINEPHRINE 4 MG/250ML-% IV SOLN
INTRAVENOUS | Status: AC
  Administered 2024-03-07: 2 ug/min via INTRAVENOUS
  Filled 2024-03-07: qty 250

## 2024-03-07 MED ORDER — SODIUM CHLORIDE 3 % IV SOLN
INTRAVENOUS | Status: DC
  Filled 2024-03-07 (×3): qty 500

## 2024-03-07 MED ORDER — SODIUM CHLORIDE 0.9 % IV SOLN: 250.0000 mL | INTRAVENOUS | Status: AC

## 2024-03-07 MED ORDER — INSULIN ASPART 100 UNIT/ML IJ SOLN
0.0000 [IU] | Freq: Three times a day (TID) | INTRAMUSCULAR | Status: DC
  Administered 2024-03-07: 2 [IU] via SUBCUTANEOUS
  Administered 2024-03-07: 5 [IU] via SUBCUTANEOUS
  Administered 2024-03-08 (×3): 3 [IU] via SUBCUTANEOUS
  Administered 2024-03-09: 5 [IU] via SUBCUTANEOUS
  Administered 2024-03-09: 8 [IU] via SUBCUTANEOUS
  Administered 2024-03-09: 3 [IU] via SUBCUTANEOUS
  Administered 2024-03-10: 5 [IU] via SUBCUTANEOUS

## 2024-03-07 MED ORDER — MAGNESIUM SULFATE 2 GM/50ML IV SOLN
2.0000 g | Freq: Once | INTRAVENOUS | Status: AC
Start: 2024-03-07 — End: 2024-03-07
  Administered 2024-03-07: 2 g via INTRAVENOUS
  Filled 2024-03-07: qty 50

## 2024-03-07 MED ORDER — NOREPINEPHRINE 4 MG/250ML-% IV SOLN
0.0000 ug/min | INTRAVENOUS | Status: DC
  Administered 2024-03-07: 2 ug/min via INTRAVENOUS

## 2024-03-07 MED ORDER — HEPARIN SODIUM (PORCINE) 5000 UNIT/ML IJ SOLN
5000.0000 [IU] | Freq: Three times a day (TID) | INTRAMUSCULAR | Status: DC
  Administered 2024-03-07 – 2024-03-10 (×10): 5000 [IU] via SUBCUTANEOUS
  Filled 2024-03-07 (×9): qty 1

## 2024-03-07 MED ORDER — CHLORHEXIDINE GLUCONATE CLOTH 2 % EX PADS
6.0000 | MEDICATED_PAD | Freq: Every day | CUTANEOUS | Status: DC
  Administered 2024-03-07 – 2024-03-10 (×5): 6 via TOPICAL

## 2024-03-07 NOTE — Progress Notes (Signed)
 Pharmacy Electrolyte Replacement  Recent Labs:  Recent Labs    03/05/24 1911 03/05/24 2043 03/06/24 1723 03/07/24 0221 03/07/24 0404  K  --    < > 4.0  --   --   MG 1.7  --   --   --  1.8  PHOS 2.5  --   --   --   --   CREATININE  --    < > 0.78 0.84  --    < > = values in this interval not displayed.    Low Critical Values (K </= 2.5, Phos </= 1, Mg </= 1) Present: None  No exclusions met.   MD Contacted: n/a  Plan: Magnesium  2g IV x1. Repeat in AM.   Harlene Boga, PharmD, BCPS, BCCCP Clinical Pharmacist Please refer to [facility] for [facility] numbers'

## 2024-03-07 NOTE — Consult Note (Signed)
 NAME:  Michael Guzman, MRN:  [id number], DOB:  1946-02-18, LOS: 1 ADMISSION DATE:  03/05/2024, CONSULTATION DATE:  03/06/2024 REFERRING MD:  Dr Fairy Ek, MD CHIEF COMPLAINT:  Hyponatremia  History of Present Illness:  78 y/o male with PMH for DM type 2, HTN, HLD, h/o prostate cancer, ASCAD s/p PTCA, OSA, fatty liver disease, GERD, and history of hyponatremia  who presented to [facility] on 6/22 with generalized weakness x 4 days, serum sodium 117 at that time.  He was given Hypertonic bolus and saline otherwise without appreciable increase in serum sodium.  He says his weakness is better but for last couple days he has had headache 6/10.   Patient was admitted to floor but now with placement of hypothyroid medications.  Pertinent  Medical History  DM type 2, HTN, HLD, h/o prostate cancer, ASCAD s/p PTCA, OSA, fatty liver disease, GERD, and history of hyponatremia   Significant Hospital Events: Including procedures, antibiotic start and stop dates in addition to other pertinent events   6/24: admit to ICU for Hypertonic saline  Interim History / Subjective:  N/a  Objective    Blood pressure 112/74, pulse (!) 58, temperature (!) 97.4 F (36.3 C), temperature source Oral, resp. rate 16, height 5' 10 (1.778 m), weight 84.7 kg, SpO2 100%.        Intake/Output Summary (Last 24 hours) at 03/07/2024 0151 Last data filed at 03/06/2024 2300 Gross per 24 hour  Intake 619.18 ml  Output 900 ml  Net -280.82 ml   Filed Weights   03/05/24 1754 03/06/24 1521  Weight: 88 kg 84.7 kg    Examination: General: PEWRRLA no icterus EOMI Supple no LN no jvd, no thyromegaly HENT: PERRLA no icterus eomi Lungs: CTA no wheezes rales or rhonchi Cardiovascular: Reg s1s2 no murmurs or gallops Abdomen: soft nt nd bs pos no guarding Extremities: no cyanosis, clubbing or edema Neuro: AAO x 3  Resolved problem list   Assessment and Plan  Acute on chronic hyponatremia  To start on Hypertonic saline  per Nephrology Monitor serum sodium q2-4hrs HTN Stable  DM Sliding scale         Best Practice (right click and Reselect all SmartList Selections daily)   Diet/type: Regular consistency (see orders) DVT prophylaxis prophylactic heparin   Pressure ulcer(s): N/A GI prophylaxis: N/A Lines: N/A Foley:  N/A Code Status:  full code   Labs   CBC: Recent Labs  Lab 03/05/24 1820  WBC 5.1  HGB 13.2  HCT 36.8*  MCV 80.2  PLT 286    Basic Metabolic Panel: Recent Labs  Lab 03/05/24 1820 03/05/24 1911 03/05/24 2043 03/05/24 2232 03/06/24 0543 03/06/24 0906 03/06/24 1412 03/06/24 1723 03/06/24 2212  NA 117*  --  119*   < > 119* 122* 119* 121* 119*  K 4.2  --  4.0  --   --   --   --  4.0  --   CL 83*  --  87*  --   --   --   --  88*  --   CO2 20*  --  20*  --   --   --   --  23  --   GLUCOSE 158*  --  104*  --   --   --   --  121*  --   BUN 7*  --  7*  --   --   --   --  <5*  --   CREATININE 0.88  --  0.83  --   --   --   --  0.78  --   CALCIUM  9.5  --  9.2  --   --   --   --  9.5  --   MG  --  1.7  --   --   --   --   --   --   --   PHOS  --  2.5  --   --   --   --   --   --   --    < > = values in this interval not displayed.   GFR: Estimated Creatinine Clearance: 81.1 mL/min (by C-G formula based on SCr of 0.78 mg/dL). Recent Labs  Lab 03/05/24 1820  WBC 5.1    Liver Function Tests: Recent Labs  Lab 03/05/24 1820  AST 20  ALT 15  ALKPHOS 66  BILITOT 0.5  PROT 7.8  ALBUMIN 4.8   No results for input(s): LIPASE, AMYLASE in the last 168 hours. No results for input(s): AMMONIA in the last 168 hours.  ABG    Component Value Date/Time   HCO3 21.0 01/11/2021 1541   TCO2 25 01/31/2018 1335   ACIDBASEDEF 0.7 01/11/2021 1541   O2SAT 23.4 01/11/2021 1541     Coagulation Profile: No results for input(s): INR, PROTIME in the last 168 hours.  Cardiac Enzymes: No results for input(s): CKTOTAL, CKMB, CKMBINDEX, TROPONINI in the last 168  hours.  HbA1C: Hgb A1c MFr Bld  Date/Time Value Ref Range Status  02/25/2024 11:11 AM 7.1 (H) <5.7 % Final    Comment:    For someone without known diabetes, a hemoglobin A1c value of 6.5% or greater indicates that they may have  diabetes and this should be confirmed with a follow-up  test. . For someone with known diabetes, a value <7% indicates  that their diabetes is well controlled and a value  greater than or equal to 7% indicates suboptimal  control. A1c targets should be individualized based on  duration of diabetes, age, comorbid conditions, and  other considerations. . Currently, no consensus exists regarding use of hemoglobin A1c for diagnosis of diabetes for children. SABRA   11/22/2023 04:31 PM 8.2 (H) <5.7 % of total Hgb Final    Comment:    For someone without known diabetes, a hemoglobin A1c value of 6.5% or greater indicates that they may have  diabetes and this should be confirmed with a follow-up  test. . For someone with known diabetes, a value <7% indicates  that their diabetes is well controlled and a value  greater than or equal to 7% indicates suboptimal  control. A1c targets should be individualized based on  duration of diabetes, age, comorbid conditions, and  other considerations. . Currently, no consensus exists regarding use of hemoglobin A1c for diagnosis of diabetes for children. .     CBG: Recent Labs  Lab 03/05/24 1755 03/06/24 0144 03/06/24 1648 03/06/24 2102  GLUCAP 166* 160* 127* 133*    Review of Systems:   Headache, no chest pain, no N/V/D, no abd pain, no blood per rectum  Past Medical History:  He,  has a past medical history of Arthritis, CAD (coronary artery disease), Chronic back pain, Chronically dry eyes, Diabetes mellitus without complication (HCC), DKA (diabetic ketoacidoses) (01/30/2017), Elevated PSA, Fatty liver, GERD (gastroesophageal reflux disease), radiation therapy, Hypertension, Hypertriglyceridemia,  Intermediate coronary syndrome (HCC) (07/26/2012), Nausea vomiting and diarrhea (11/11/2023), Neuromuscular disorder (HCC), and Prostate cancer (HCC).   Surgical  History:   Past Surgical History:  Procedure Laterality Date   APPENDECTOMY     CARDIAC SURGERY  ~2017   3 stent placed.   COLONOSCOPY  02/12/2010   Buccini   COLONOSCOPY WITH PROPOFOL  N/A 09/21/2022   Procedure: COLONOSCOPY WITH PROPOFOL ;  Surgeon: Wilhelmenia Aloha Raddle., MD;  Location: [facility];  Service: Gastroenterology;  Laterality: N/A;   ENDOSCOPIC MUCOSAL RESECTION N/A 09/21/2022   Procedure: ENDOSCOPIC MUCOSAL RESECTION;  Surgeon: Wilhelmenia Aloha Raddle., MD;  Location: [facility];  Service: Gastroenterology;  Laterality: N/A;   HEMOSTASIS CLIP PLACEMENT  09/21/2022   Procedure: HEMOSTASIS CLIP PLACEMENT;  Surgeon: Wilhelmenia Aloha Raddle., MD;  Location: [facility];  Service: Gastroenterology;;   LEFT HEART CATHETERIZATION WITH CORONARY ANGIOGRAM N/A 06/27/2012   Procedure: LEFT HEART CATHETERIZATION WITH CORONARY ANGIOGRAM;  Surgeon: Oneil Parchment, MD;  Location: [facility] CATH LAB;  Service: Cardiovascular;  Laterality: N/A;   LEFT HEART CATHETERIZATION WITH CORONARY ANGIOGRAM N/A 07/25/2012   Procedure: LEFT HEART CATHETERIZATION WITH CORONARY ANGIOGRAM;  Surgeon: Wilbert JONELLE Bihari, MD;  Location: [facility]ATH [facility];  Service: Cardiovascular;  Laterality: N/A;   PROSTATE BIOPSY     RADIOLOGY WITH ANESTHESIA N/A 12/03/2015   Procedure: MRI LUMBAR SPINE;  Surgeon: Medication Radiologist, MD;  Location: [facility];  Service: Radiology;  Laterality: N/A;   SUBMUCOSAL LIFTING INJECTION  09/21/2022   Procedure: SUBMUCOSAL LIFTING INJECTION;  Surgeon: Wilhelmenia Aloha Raddle., MD;  Location: [facility];  Service: Gastroenterology;;   TOTAL HIP ARTHROPLASTY Right 04/28/2019   Procedure: TOTAL HIP ARTHROPLASTY ANTERIOR APPROACH;  Surgeon: Yvone Rush, MD;  Location: [facility];  Service: Orthopedics;  Laterality: Right;     Social History:   reports  that he quit smoking about 12 years ago. His smoking use included cigarettes. He started smoking about 65 years ago. He has a 39.6 pack-year smoking history. He has never used smokeless tobacco. He reports that he does not currently use alcohol. He reports that he does not use drugs.   Family History:  His family history includes Diabetes in his sister. There is no history of Stomach cancer, Colon cancer, Esophageal cancer, Inflammatory bowel disease, Liver disease, Pancreatic cancer, or Rectal cancer.   Allergies No Known Allergies   Home Medications  Prior to Admission medications   Medication Sig Start Date End Date Taking? Authorizing Provider  Ascorbic Acid (VITAMIN C) 1000 MG tablet Take 1,000 mg by mouth in the morning.   Yes [provider]  aspirin  EC 81 MG tablet Take 1 tablet (81 mg total) by mouth daily. Patient taking differently: Take 81 mg by mouth in the morning. 10/27/19  Yes Parchment Oneil BROCKS, MD  atorvastatin  (LIPITOR ) 10 MG tablet TAKE 1 TABLET BY MOUTH DAILY Patient taking differently: Take 10 mg by mouth in the morning. 06/23/23  Yes [name], [name], [name]  B Complex-C (SUPER B COMPLEX PO) Take 1 tablet by mouth in the morning.   Yes [provider]  buPROPion  (WELLBUTRIN  XL) 300 MG 24 hr tablet TAKE 1 TABLET BY MOUTH DAILY FOR MOOD, FOCUS AND CONCENTRATION Patient taking differently: Take 300 mg by mouth in the morning. TAKE 1 TABLET BY MOUTH DAILY FOR MOOD, FOCUS AND CONCENTRATION 06/16/23  Yes Cranford, Tonya, NP  Cholecalciferol  (VITAMIN D3) 250 MCG (10000 UT) capsule Take 10,000 Units by mouth in the morning.   Yes [provider]  gabapentin  (NEURONTIN ) 800 MG tablet TAKE 1 TABLET BY MOUTH 3 TO 4 TIMES DAILY FOR PAIN, HOT FLASHES AND SWEATS 01/17/24  Yes Kayla Gauze S, [name]  glycopyrrolate  (ROBINUL ) 2 MG tablet Take  1 tablet  3 x / day for Excessive Sweating  TAKE ONE TABLET BY                                                 MOUTH Patient taking  differently: Take 2 mg by mouth 3 (three) times daily. 02/28/24  Yes Kayla Gauze RAMAN, [name]  insulin  NPH-regular Human (HUMULIN  70/30) (70-30) 100 UNIT/ML injection INJECT SUBCUTANEOUSLY 50  UNITS TWICE DAILY Patient taking differently: Inject into the skin in the morning. Sliding Scale 01/17/24  Yes Kayla Gauze RAMAN, [name]  isosorbide  mononitrate (IMDUR ) 30 MG 24 hr tablet Take 1 tablet (30 mg total) by mouth daily. Patient taking differently: Take 30 mg by mouth as needed. 01/12/24  Yes Howard, Amber S, [name]  latanoprost  (XALATAN ) 0.005 % ophthalmic solution Place 1 drop into both eyes at bedtime. 02/24/24  Yes [provider]  Magnesium  500 MG TABS Take 500 mg by mouth in the morning.   Yes [provider]  metFORMIN  (GLUCOPHAGE -XR) 500 MG 24 hr tablet TAKE 2 TABLETS BY MOUTH TWICE  DAILY WITH MEALS FOR DIABETES Patient taking differently: Take 500 mg by mouth in the morning and at bedtime. TAKE 2 TABLETS BY MOUTH  TWICE DAILY WITH MEALS FOR  DIABETES 06/23/23  Yes [name], [name], [name]  metoprolol  succinate (TOPROL -XL) 25 MG 24 hr tablet TAKE ONE-HALF TABLET BY MOUTH  DAILY Patient taking differently: Take 12.5 mg by mouth at bedtime. 10/19/23  Yes [name], MD  montelukast  (SINGULAIR ) 10 MG tablet TAKE 1 TABLET BY MOUTH  DAILY FOR ALLERGIES Patient taking differently: Take 10 mg by mouth in the morning. 01/19/24  Yes Howard, Amber S, [name]  nitroGLYCERIN  (NITROSTAT ) 0.4 MG SL tablet DISSOLVE 1 TABLET UNDER THE  TONGUE EVERY 5 MINUTES AS NEEDED FOR CHEST PAIN. MAX OF 3 TABLETS IN 15 MINUTES. CALL 911 IF PAIN  PERSISTS. 02/28/24  Yes Howard, Amber S, [name]  pantoprazole  (PROTONIX ) 40 MG tablet TAKE 1 TABLET BY MOUTH  DAILY TO PREVENT HEARTBURN  AND INDIGESTION Patient taking differently: Take 40 mg by mouth in the morning. 01/19/24  Yes Howard, Amber S, [name]  tamsulosin  (FLOMAX ) 0.4 MG CAPS capsule Take 0.4 mg by mouth at bedtime.   Yes [provider]  Vitamin E 450 MG (1000 UT) CAPS  Take 450 mg by mouth in the morning.   Yes [provider]  zinc gluconate 50 MG tablet Take 50 mg by mouth in the morning.   Yes [provider]  Blood Glucose Monitoring Suppl (ONE TOUCH ULTRA 2) w/Device KIT Use as directed 03/12/22   [name], [name], [name]  glucose blood (ONETOUCH ULTRA) test strip CHECK BLOOD SUGAR 3 TIMES  DAILY 01/17/24   Kayla Gauze RAMAN, [name]  Insulin  Syringes, Disposable, U-100 1 ML MISC 50 Units by Does not apply route 2 (two) times daily. inject 50 units into skin 2 x /day 07/23/20   [name], MD  Lancets [facility] DELICA PLUS [place]) MISC CHECK BLOOD SUGAR 3 TIMES  DAILY 07/09/22   [name], MD     Critical care time: 65   The patient is critically ill with multiple organ system failure and requires high complexity decision making for assessment and support, frequent evaluation and titration of  therapies, advanced monitoring, review of radiographic studies and interpretation of complex data.   Critical Care Time devoted to patient care services, exclusive of separately billable procedures, described in this note is 36 minutes.   Orlin Fairly,  MD [facility] See Amion for pager  If no response to pager , please call [phone] until 7pm After 7:00 pm call Elink  [phone] 03/07/2024, 1:51 AM

## 2024-03-07 NOTE — Progress Notes (Signed)
 Patient ID: GLENROY CROSSEN, male   DOB: 05/05/1946, 78 y.o.   MRN: [id number] S: Transferred to ICU last night due to worsening hyponatremia and started on 3% saline drip.  Feeling better this morning. O:BP 128/73   Pulse 67   Temp (!) 97.5 F (36.4 C) (Oral)   Resp 15   Ht 5' 10 (1.778 m)   Wt 86.7 kg   SpO2 98%   BMI 27.43 kg/m   Intake/Output Summary (Last 24 hours) at 03/07/2024 1305 Last data filed at 03/07/2024 1200 Gross per 24 hour  Intake 1236.01 ml  Output 750 ml  Net 486.01 ml   Intake/Output: I/O last 3 completed shifts: In: 876.3 [P.O.:100; I.V.:214.5; IV Piggyback:561.8] Out: 900 [Urine:900]  Intake/Output this shift:  Total I/O In: 520.7 [P.O.:240; I.V.:280.7] Out: 350 [Urine:350] Weight change: -3.298 kg Gen: NAD CVS: RRR Resp:CTA Abd: +BS, soft, NT/ND Ext: no edema  Recent Labs  Lab 03/05/24 1820 03/05/24 1911 03/05/24 2043 03/05/24 2232 03/06/24 1412 03/06/24 1723 03/06/24 2212 03/07/24 0221 03/07/24 0404 03/07/24 0907 03/07/24 1230  NA 117*  --  119*   < > 119* 121* 119* 118* 117* 121* 126*  K 4.2  --  4.0  --   --  4.0  --   --   --   --   --   CL 83*  --  87*  --   --  88*  --   --   --   --   --   CO2 20*  --  20*  --   --  23  --   --   --   --   --   GLUCOSE 158*  --  104*  --   --  121*  --   --   --   --   --   BUN 7*  --  7*  --   --  <5*  --   --   --   --   --   CREATININE 0.88  --  0.83  --   --  0.78  --  0.84  --   --   --   ALBUMIN 4.8  --   --   --   --   --   --   --   --   --   --   CALCIUM  9.5  --  9.2  --   --  9.5  --   --   --   --   --   PHOS  --  2.5  --   --   --   --   --   --   --   --   --   AST 20  --   --   --   --   --   --   --   --   --   --   ALT 15  --   --   --   --   --   --   --   --   --   --    < > = values in this interval not displayed.   Liver Function Tests: Recent Labs  Lab 03/05/24 1820  AST 20  ALT 15  ALKPHOS 66  BILITOT 0.5  PROT 7.8  ALBUMIN 4.8   No results for input(s):  LIPASE, AMYLASE in the last 168 hours. No results for input(s): AMMONIA in the last 168 hours. CBC: Recent  Labs  Lab 03/05/24 1820 03/07/24 0221 03/07/24 0404  WBC 5.1 5.5 5.7  HGB 13.2 11.5* 11.2*  HCT 36.8* 33.0* 32.2*  MCV 80.2 83.3 83.2  PLT 286 246 262   Cardiac Enzymes: No results for input(s): CKTOTAL, CKMB, CKMBINDEX, TROPONINI in the last 168 hours. CBG: Recent Labs  Lab 03/06/24 0144 03/06/24 1648 03/06/24 2102 03/07/24 0742 03/07/24 1107  GLUCAP 160* 127* 133* 185* 214*    Iron Studies: No results for input(s): IRON, TIBC, TRANSFERRIN, FERRITIN in the last 72 hours. Studies/Results: No results found.  aspirin  EC  81 mg Oral Daily   atorvastatin   10 mg Oral Daily   Chlorhexidine  Gluconate Cloth  6 each Topical Daily   gabapentin   400 mg Oral TID   heparin   5,000 Units Subcutaneous Q8H   insulin  aspart  0-15 Units Subcutaneous TID WC   melatonin  3 mg Oral QHS   montelukast   10 mg Oral QHS   pantoprazole   40 mg Oral Daily   sodium chloride   1 g Oral BID WC   tamsulosin   0.4 mg Oral QHS    BMET    Component Value Date/Time   NA 121 (L) 03/07/2024 0907   K 4.0 03/06/2024 1723   CL 88 (L) 03/06/2024 1723   CO2 23 03/06/2024 1723   GLUCOSE 121 (H) 03/06/2024 1723   BUN <5 (L) 03/06/2024 1723   CREATININE 0.84 03/07/2024 0221   CREATININE 0.94 02/25/2024 1111   CALCIUM  9.5 03/06/2024 1723   GFRNONAA >60 03/07/2024 0221   GFRNONAA 68 01/21/2021 0923   GFRAA 78 01/21/2021 0923   CBC    Component Value Date/Time   WBC 5.7 03/07/2024 0404   RBC 3.87 (L) 03/07/2024 0404   HGB 11.2 (L) 03/07/2024 0404   HCT 32.2 (L) 03/07/2024 0404   PLT 262 03/07/2024 0404   MCV 83.2 03/07/2024 0404   MCH 28.9 03/07/2024 0404   MCHC 34.8 03/07/2024 0404   RDW 12.2 03/07/2024 0404   LYMPHSABS 1,734 05/06/2023 1501   MONOABS 0.7 01/11/2021 1433   EOSABS 160 02/25/2024 1111   BASOSABS 50 02/25/2024 1111    Assessment/Plan:  Acute on  chronic hyponatremia - he has been hospitalized 3 times in the past 4 months with similar presentation, although he had N/V/D on the previous 2.  Sosm low at 249, Uosm high at 504 and UNa high at 91, however these were drawn 2 hours after receiving 500 mL of IV NS and 100 mL of 3% saline.  His repeat Na dropped to 117 and he was transferred to the ICU and started on 3% saline at 45 mL/hr with improvement of symptoms and Na to 126.  He is also on NaCl tabs 1 gm bid.  Cortisol was 14.5 so WNL.  TSH was WNL.  No SSRI but is on Wellbutrin  (although rarely causes hyponatremia) which was stopped today by PCCM.  Concerning that this is a recurring issue.  He is currently asymptomatic.  Continue to follow Na levels every 3 hours. Hypoglycemia - hold metformin  for now and check cortisol as above. HTN - Bp was low yesterday but responded to IVF. DM type 2 - per primary.  Hold metformin  as above.   Fairy RONAL Sellar, MD [facility]

## 2024-03-07 NOTE — Progress Notes (Addendum)
 eLink Physician-Brief Progress Note Patient Name: [name] DOB: 03-25-1946 MRN: [id number]   Date of Service  03/07/2024  HPI/Events of Note  78 y/o male with PMH for DM type 2, HTN, HLD, h/o prostate cancer, ASCAD s/p PTCA, OSA, fatty liver disease, GERD, and history of hyponatremia  who presented to [facility] on 6/22 with generalized weakness x 4 days with severe hyponatremia. Transferred to ICU for hypertonic saline.  Upon transfer, the patient became less responsive, diaphoretic, and hypotensive.  This slowly improved with the initiation of low-dose norepinephrine and transition to supine positioning.  Attempted straight leg raise with minimal benefit.  Results show hyponatremia to 119 evidence of normocytic anemia.  No recent imaging.  eICU Interventions  Maintain hypertonic saline and trend sodiums frequently  Maintain regular diet  Numerous antihypertensives in place-isosorbide , metoprolol  succinate, and on tamsulosin , hold doses until off norepinephrine  DVT prophylaxis with heparin  GI prophylaxis with pantoprazole    0425 -sodium 118, increase hypertonic to 60 cc/h  0517 - Na 117 -maintain 3 percent 60 cc  Intervention Category Evaluation Type: New Patient Evaluation  Michael Guzman 03/07/2024, 3:51 AM

## 2024-03-07 NOTE — Consult Note (Addendum)
 NAME:  Michael Guzman, MRN:  [id number], DOB:  Feb 03, 1946, LOS: 1 ADMISSION DATE:  03/05/2024, CONSULTATION DATE:  03/06/2024 REFERRING MD:  Dr Fairy Ek, MD CHIEF COMPLAINT:  Hyponatremia  History of Present Illness:  78 y/o male with PMH for DM type 2, HTN, HLD, h/o prostate cancer, ASCAD s/p PTCA, OSA, fatty liver disease, GERD, and history of hyponatremia  who presented to [facility] on 6/22 with generalized weakness x 4 days, serum sodium 117 at that time.  He was given Hypertonic bolus and saline otherwise without appreciable increase in serum sodium.  He says his weakness is better but for last couple days he has had headache 6/10.   Patient was admitted to floor but now with placement of hypothyroid medications.  Pertinent  Medical History  DM type 2, HTN, HLD, h/o prostate cancer, ASCAD s/p PTCA, OSA, fatty liver disease, GERD, and history of hyponatremia   Significant Hospital Events: Including procedures, antibiotic start and stop dates in addition to other pertinent events   6/24: admit to ICU for Hypertonic saline  Interim History / Subjective:   Patient reports his wife passed away 2-3 weeks ago and he has not been eating as well as normal. He has not been sleeping well either. Regarding wellbutrin , he reports his primary recommended he start something for mood. He was not seeking any treatment for anxiety/depression.   Objective    Blood pressure 116/68, pulse (!) 57, temperature (!) 97.4 F (36.3 C), temperature source Oral, resp. rate 11, height 5' 10 (1.778 m), weight 86.7 kg, SpO2 98%.        Intake/Output Summary (Last 24 hours) at 03/07/2024 0852 Last data filed at 03/07/2024 0600 Gross per 24 hour  Intake 715.31 ml  Output 400 ml  Net 315.31 ml   Filed Weights   03/05/24 1754 03/06/24 1521 03/07/24 0423  Weight: 88 kg 84.7 kg 86.7 kg    Examination: General: elderly male, no acute distress HENT: PERRLA, moist mucous memrbanes Lungs: clear to  auscultation, no wheezing Cardiovascular: rrr, no wheezing Abdomen: soft, non-tender, non-distended Extremities: no cyanosis, clubbing or edema Neuro: AAO x 3  Labs reviewed: Na 117 TSH 0.507 AM Cortisol 14.5  Resolved problem list   Assessment and Plan  Acute on chronic hyponatremia  Continue Hypertonic saline  1g NaCl tab twice daily Monitor serum sodium q4hrs TSH/Cortisol within normal limits. Concern for SIADH vs hypovolemic hyponatremia Has been on wellbutrin  for years, low Na appears to be an issues since beginning of this year. Will stop wellbutrin   Grief/Depression/Insomnia - stop wellbutrin  - consider adding mirtazipine for sleep/depression - add melatonin nightly  Hypotension - brief period on levophed - hold antihypertensives  Hx of HTN Hold Metoprolol  XL 12.5mg  daily Hold Imdur  30mg  daily  DM Sliding scale        Mild Normocytic Anemia - monitor  GERD Protonix  daily  Best Practice (right click and Reselect all SmartList Selections daily)   Diet/type: Regular consistency (see orders) DVT prophylaxis prophylactic heparin   Pressure ulcer(s): N/A GI prophylaxis: N/A Lines: N/A Foley:  N/A Code Status:  full code   Labs   CBC: Recent Labs  Lab 03/05/24 1820 03/07/24 0221 03/07/24 0404  WBC 5.1 5.5 5.7  HGB 13.2 11.5* 11.2*  HCT 36.8* 33.0* 32.2*  MCV 80.2 83.3 83.2  PLT 286 246 262    Basic Metabolic Panel: Recent Labs  Lab 03/05/24 1820 03/05/24 1911 03/05/24 2043 03/05/24 2232 03/06/24 1412 03/06/24 1723 03/06/24 2212 03/07/24 0221  03/07/24 0404  NA 117*  --  119*   < > 119* 121* 119* 118* 117*  K 4.2  --  4.0  --   --  4.0  --   --   --   CL 83*  --  87*  --   --  88*  --   --   --   CO2 20*  --  20*  --   --  23  --   --   --   GLUCOSE 158*  --  104*  --   --  121*  --   --   --   BUN 7*  --  7*  --   --  <5*  --   --   --   CREATININE 0.88  --  0.83  --   --  0.78  --  0.84  --   CALCIUM  9.5  --  9.2  --   --  9.5   --   --   --   MG  --  1.7  --   --   --   --   --   --  1.8  PHOS  --  2.5  --   --   --   --   --   --   --    < > = values in this interval not displayed.   GFR: Estimated Creatinine Clearance: 77.2 mL/min (by C-G formula based on SCr of 0.84 mg/dL). Recent Labs  Lab 03/05/24 1820 03/07/24 0221 03/07/24 0404  WBC 5.1 5.5 5.7    Liver Function Tests: Recent Labs  Lab 03/05/24 1820  AST 20  ALT 15  ALKPHOS 66  BILITOT 0.5  PROT 7.8  ALBUMIN 4.8   No results for input(s): LIPASE, AMYLASE in the last 168 hours. No results for input(s): AMMONIA in the last 168 hours.  ABG    Component Value Date/Time   HCO3 21.0 01/11/2021 1541   TCO2 25 01/31/2018 1335   ACIDBASEDEF 0.7 01/11/2021 1541   O2SAT 23.4 01/11/2021 1541     Coagulation Profile: No results for input(s): INR, PROTIME in the last 168 hours.  Cardiac Enzymes: No results for input(s): CKTOTAL, CKMB, CKMBINDEX, TROPONINI in the last 168 hours.  HbA1C: Hgb A1c MFr Bld  Date/Time Value Ref Range Status  02/25/2024 11:11 AM 7.1 (H) <5.7 % Final    Comment:    For someone without known diabetes, a hemoglobin A1c value of 6.5% or greater indicates that they may have  diabetes and this should be confirmed with a follow-up  test. . For someone with known diabetes, a value <7% indicates  that their diabetes is well controlled and a value  greater than or equal to 7% indicates suboptimal  control. A1c targets should be individualized based on  duration of diabetes, age, comorbid conditions, and  other considerations. . Currently, no consensus exists regarding use of hemoglobin A1c for diagnosis of diabetes for children. SABRA   11/22/2023 04:31 PM 8.2 (H) <5.7 % of total Hgb Final    Comment:    For someone without known diabetes, a hemoglobin A1c value of 6.5% or greater indicates that they may have  diabetes and this should be confirmed with a follow-up  test. . For someone with known  diabetes, a value <7% indicates  that their diabetes is well controlled and a value  greater than or equal to 7% indicates suboptimal  control. A1c targets should  be individualized based on  duration of diabetes, age, comorbid conditions, and  other considerations. . Currently, no consensus exists regarding use of hemoglobin A1c for diagnosis of diabetes for children. .     CBG: Recent Labs  Lab 03/05/24 1755 03/06/24 0144 03/06/24 1648 03/06/24 2102 03/07/24 0742  GLUCAP 166* 160* 127* 133* 185*    Critical care time: 68   The patient is critically ill with multiple organ system failure and requires high complexity decision making for assessment and support, frequent evaluation and titration of therapies, advanced monitoring, review of radiographic studies and interpretation of complex data.   Critical Care Time devoted to patient care services, exclusive of separately billable procedures, described in this note is 32 minutes.   Dorn Chill, MD [facility] Office: [phone]   See Amion for personal pager PCCM on call pager [phone] until 7pm. Please call Elink 7p-7a. [phone]

## 2024-03-07 NOTE — Plan of Care (Signed)
  Problem: Clinical Measurements: Goal: Ability to maintain clinical measurements within normal limits will improve Outcome: Progressing Goal: Will remain free from infection Outcome: Progressing Goal: Diagnostic test results will improve Outcome: Progressing Goal: Cardiovascular complication will be avoided Outcome: Progressing   Problem: Elimination: Goal: Will not experience complications related to bowel motility Outcome: Progressing

## 2024-03-08 DIAGNOSIS — E871 Hypo-osmolality and hyponatremia: Secondary | ICD-10-CM | POA: Diagnosis not present

## 2024-03-08 LAB — SODIUM
Sodium: 130 mmol/L — ABNORMAL LOW (ref 135–145)
Sodium: 130 mmol/L — ABNORMAL LOW (ref 135–145)
Sodium: 129 mmol/L — ABNORMAL LOW (ref 135–145)
Sodium: 131 mmol/L — ABNORMAL LOW (ref 135–145)
Sodium: 132 mmol/L — ABNORMAL LOW (ref 135–145)

## 2024-03-08 LAB — GLUCOSE, CAPILLARY
Glucose-Capillary: 159 mg/dL — ABNORMAL HIGH (ref 70–99)
Glucose-Capillary: 170 mg/dL — ABNORMAL HIGH (ref 70–99)
Glucose-Capillary: 162 mg/dL — ABNORMAL HIGH (ref 70–99)
Glucose-Capillary: 198 mg/dL — ABNORMAL HIGH (ref 70–99)

## 2024-03-08 LAB — OSMOLALITY: Osmolality: 287 mosm/kg (ref 275–295)

## 2024-03-08 MED ORDER — MELATONIN 3 MG PO TABS
3.0000 mg | ORAL_TABLET | Freq: Every day | ORAL | Status: DC
  Administered 2024-03-08 – 2024-03-09 (×2): 3 mg via ORAL
  Filled 2024-03-08 (×2): qty 1

## 2024-03-08 NOTE — Plan of Care (Signed)

## 2024-03-08 NOTE — TOC CM/SW Note (Signed)
 Transition of Care [facility]) - Inpatient Brief Assessment   Patient Details  Name: Michael Guzman MRN: [id number] Date of Birth: 1945/11/14  Transition of [facility]) CM/SW Contact:    Tom-Johnson, Harvest Muskrat, RN Phone Number: 03/08/2024, 1:19 PM   Clinical Narrative:  Patient presented to the ED with Generalized Weakness, Fatigue. Admitted with Hyponatremia. Patient has hx of CAD s/p DES to LAD/OM2, recurrent Hyponatremia, HTN, HLD, DM2 and GERD. Nephrology following.  From hoe alone. Patient states he recently lost his wife 2 wks prior this admit. Has three children, supportive cousins, nieces and nephews. Retired, independent with his care and drive self. Has a cane and shower seat at home.  PCP is [name], [name] and uses [facility] on [address].   No TOC needs or recommendations noted at this time.  Patient not Medically ready for discharge.  CM will continue to follow as patient progresses with care towards discharge.              Transition of Care Asessment: Insurance and Status: Insurance coverage has been reviewed Patient has primary care physician: Yes Home environment has been reviewed: Yes Prior level of function:: Independent Prior/Current Home Services: No current home services Social Drivers of Health Review: SDOH reviewed no interventions necessary Readmission risk has been reviewed: Yes Transition of care needs: no transition of care needs at this time

## 2024-03-08 NOTE — Progress Notes (Signed)
 NAME:  Michael Guzman, MRN:  [id number], DOB:  1946/06/15, LOS: 2 ADMISSION DATE:  03/05/2024, CONSULTATION DATE:  03/06/2024 REFERRING MD:  Dr Fairy Ek, MD CHIEF COMPLAINT:  Hyponatremia  History of Present Illness:  78 y/o male with PMH for DM type 2, HTN, HLD, h/o prostate cancer, ASCAD s/p PTCA, OSA, fatty liver disease, GERD, and history of hyponatremia  who presented to [facility] on 6/22 with generalized weakness x 4 days, serum sodium 117 at that time.  He was given Hypertonic bolus and saline otherwise without appreciable increase in serum sodium.  He says his weakness is better but for last couple days he has had headache 6/10.   Patient was admitted to floor but now with placement of hypothyroid medications.  Pertinent  Medical History  DM type 2, HTN, HLD, h/o prostate cancer, ASCAD s/p PTCA, OSA, fatty liver disease, GERD, and history of hyponatremia   Significant Hospital Events: Including procedures, antibiotic start and stop dates in addition to other pertinent events   6/24: admit to ICU for Hypertonic saline  Interim History / Subjective:   Off HTS since yesterday Na 132 overnight so salt tabs were stopped Subsequent 3 Na 130 > 130 > 129 (most recent)   Objective    Blood pressure 103/84, pulse 71, temperature 97.7 F (36.5 C), temperature source Oral, resp. rate 19, height 5' 10 (1.778 m), weight 83.8 kg, SpO2 96%.        Intake/Output Summary (Last 24 hours) at 03/08/2024 1031 Last data filed at 03/08/2024 0800 Gross per 24 hour  Intake 480 ml  Output 4855 ml  Net -4375 ml   Filed Weights   03/06/24 1521 03/07/24 0423 03/08/24 0500  Weight: 84.7 kg 86.7 kg 83.8 kg    Examination: General: WDWN elderly M NAD  HENT:  NCAT pink mm  Lungs: even and unlabored  Cardiovascular: rrr cap refill < 3 sec  Abdomen: soft round  Extremities: no obvious acute joint deformity  Neuro:  AAOx3   Resolved problem list   Assessment and Plan    AoC hyponatremia   -? Medication related SIADH +/- low solute intake w diet change after wife's passing.  -wellbutin stopped. Would be unusual culprit, but w chronicity of the issue is possible -think that solute intake is a factor as well- wife recently died and she did the cooking  P -off hypertonic, can txf out of ICU -will check a Na mid day 6/24 then 5a/5p  -holding salt tabs for now (stopped 6/23-24 night w Na 132. Has since drifted nicely )  -nephro following   Grief / Depression / Sleep disturbance  P -buproprion stopped as above  -melatonin retimed for 8p - consider adding mirtazipine for sleep/depression  Hx of HTN HLD  CAD -home metop / imdur  are held w intermittently soft pressures. Resume as able (might consider alt to metop as his HR ias consistently  60s)  -statin  -ASA  DM -SSI         Mild Normocytic Anemia - monitor PRN   GERD  - PPI  Best Practice (right click and Reselect all SmartList Selections daily)   Diet/type: Regular consistency (see orders) DVT prophylaxis prophylactic heparin   Pressure ulcer(s): N/A GI prophylaxis: N/A Lines: N/A Foley:  N/A Code Status:  full code  Dispo: stable to txf out of ICU 6/25. Will ask TRH to take back over 6/26    Labs   CBC: Recent Labs  Lab 03/05/24 1820 03/07/24 0221  03/07/24 0404  WBC 5.1 5.5 5.7  HGB 13.2 11.5* 11.2*  HCT 36.8* 33.0* 32.2*  MCV 80.2 83.3 83.2  PLT 286 246 262    Basic Metabolic Panel: Recent Labs  Lab 03/05/24 1820 03/05/24 1911 03/05/24 2043 03/05/24 2232 03/06/24 1723 03/06/24 2212 03/07/24 0221 03/07/24 0404 03/07/24 0907 03/07/24 1801 03/07/24 2053 03/08/24 0048 03/08/24 0448 03/08/24 0737  NA 117*  --  119*   < > 121*   < > 118* 117*   < > 129* 132* 130* 130* 129*  K 4.2  --  4.0  --  4.0  --   --   --   --   --   --   --   --   --   CL 83*  --  87*  --  88*  --   --   --   --   --   --   --   --   --   CO2 20*  --  20*  --  23  --   --   --   --   --   --   --   --   --    GLUCOSE 158*  --  104*  --  121*  --   --   --   --   --   --   --   --   --   BUN 7*  --  7*  --  <5*  --   --   --   --   --   --   --   --   --   CREATININE 0.88  --  0.83  --  0.78  --  0.84  --   --   --   --   --   --   --   CALCIUM  9.5  --  9.2  --  9.5  --   --   --   --   --   --   --   --   --   MG  --  1.7  --   --   --   --   --  1.8  --   --   --   --   --   --   PHOS  --  2.5  --   --   --   --   --   --   --   --   --   --   --   --    < > = values in this interval not displayed.   GFR: Estimated Creatinine Clearance: 77.2 mL/min (by C-G formula based on SCr of 0.84 mg/dL). Recent Labs  Lab 03/05/24 1820 03/07/24 0221 03/07/24 0404  WBC 5.1 5.5 5.7    Liver Function Tests: Recent Labs  Lab 03/05/24 1820  AST 20  ALT 15  ALKPHOS 66  BILITOT 0.5  PROT 7.8  ALBUMIN 4.8   No results for input(s): LIPASE, AMYLASE in the last 168 hours. No results for input(s): AMMONIA in the last 168 hours.  ABG    Component Value Date/Time   HCO3 21.0 01/11/2021 1541   TCO2 25 01/31/2018 1335   ACIDBASEDEF 0.7 01/11/2021 1541   O2SAT 23.4 01/11/2021 1541     Coagulation Profile: No results for input(s): INR, PROTIME in the last 168 hours.  Cardiac Enzymes: No results for input(s): CKTOTAL, CKMB, CKMBINDEX, TROPONINI in the last 168 hours.  HbA1C: Hgb A1c MFr Bld  Date/Time Value Ref Range Status  02/25/2024 11:11 AM 7.1 (H) <5.7 % Final    Comment:    For someone without known diabetes, a hemoglobin A1c value of 6.5% or greater indicates that they may have  diabetes and this should be confirmed with a follow-up  test. . For someone with known diabetes, a value <7% indicates  that their diabetes is well controlled and a value  greater than or equal to 7% indicates suboptimal  control. A1c targets should be individualized based on  duration of diabetes, age, comorbid conditions, and  other considerations. . Currently, no consensus exists  regarding use of hemoglobin A1c for diagnosis of diabetes for children. SABRA   11/22/2023 04:31 PM 8.2 (H) <5.7 % of total Hgb Final    Comment:    For someone without known diabetes, a hemoglobin A1c value of 6.5% or greater indicates that they may have  diabetes and this should be confirmed with a follow-up  test. . For someone with known diabetes, a value <7% indicates  that their diabetes is well controlled and a value  greater than or equal to 7% indicates suboptimal  control. A1c targets should be individualized based on  duration of diabetes, age, comorbid conditions, and  other considerations. . Currently, no consensus exists regarding use of hemoglobin A1c for diagnosis of diabetes for children. .     CBG: Recent Labs  Lab 03/07/24 0742 03/07/24 1107 03/07/24 1555 03/07/24 2134 03/08/24 0749  GLUCAP 185* 214* 124* 120* 159*    CCT na  Mod MDM   Ronnald Gave MSN, AGACNP-BC [facility] Amion for pager  03/08/2024, 10:31 AM

## 2024-03-08 NOTE — Progress Notes (Signed)
 Patient ID: Michael Guzman, male   DOB: 12-05-1945, 79 y.o.   MRN: [id number] S: Feels better, no new complaints. O:BP (!) 142/79   Pulse 61   Temp 97.7 F (36.5 C) (Oral)   Resp 13   Ht 5' 10 (1.778 m)   Wt 83.8 kg   SpO2 99%   BMI 26.51 kg/m   Intake/Output Summary (Last 24 hours) at 03/08/2024 1432 Last data filed at 03/08/2024 1400 Gross per 24 hour  Intake 240 ml  Output 5105 ml  Net -4865 ml   Intake/Output: I/O last 3 completed shifts: In: 616.8 [P.O.:240; I.V.:376.8] Out: 5255 [Urine:5255]  Intake/Output this shift:  Total I/O In: 240 [P.O.:240] Out: 600 [Urine:600] Weight change: -0.9 kg Gen: NAD CVS: RRR Resp: CTA Abd: +BS, soft, NT/ND Ext: no edema.  Recent Labs  Lab 03/05/24 1820 03/05/24 1911 03/05/24 2043 03/05/24 2232 03/06/24 1723 03/06/24 2212 03/07/24 0221 03/07/24 0404 03/07/24 1536 03/07/24 1801 03/07/24 2053 03/08/24 0048 03/08/24 0448 03/08/24 0737 03/08/24 1328  NA 117*  --  119*   < > 121*   < > 118*   < > 126* 129* 132* 130* 130* 129* 131*  K 4.2  --  4.0  --  4.0  --   --   --   --   --   --   --   --   --   --   CL 83*  --  87*  --  88*  --   --   --   --   --   --   --   --   --   --   CO2 20*  --  20*  --  23  --   --   --   --   --   --   --   --   --   --   GLUCOSE 158*  --  104*  --  121*  --   --   --   --   --   --   --   --   --   --   BUN 7*  --  7*  --  <5*  --   --   --   --   --   --   --   --   --   --   CREATININE 0.88  --  0.83  --  0.78  --  0.84  --   --   --   --   --   --   --   --   ALBUMIN 4.8  --   --   --   --   --   --   --   --   --   --   --   --   --   --   CALCIUM  9.5  --  9.2  --  9.5  --   --   --   --   --   --   --   --   --   --   PHOS  --  2.5  --   --   --   --   --   --   --   --   --   --   --   --   --   AST 20  --   --   --   --   --   --   --   --   --   --   --   --   --   --  ALT 15  --   --   --   --   --   --   --   --   --   --   --   --   --   --    < > = values in this interval  not displayed.   Liver Function Tests: Recent Labs  Lab 03/05/24 1820  AST 20  ALT 15  ALKPHOS 66  BILITOT 0.5  PROT 7.8  ALBUMIN 4.8   No results for input(s): LIPASE, AMYLASE in the last 168 hours. No results for input(s): AMMONIA in the last 168 hours. CBC: Recent Labs  Lab 03/05/24 1820 03/07/24 0221 03/07/24 0404  WBC 5.1 5.5 5.7  HGB 13.2 11.5* 11.2*  HCT 36.8* 33.0* 32.2*  MCV 80.2 83.3 83.2  PLT 286 246 262   Cardiac Enzymes: No results for input(s): CKTOTAL, CKMB, CKMBINDEX, TROPONINI in the last 168 hours. CBG: Recent Labs  Lab 03/07/24 1107 03/07/24 1555 03/07/24 2134 03/08/24 0749 03/08/24 1154  GLUCAP 214* 124* 120* 159* 170*    Iron Studies: No results for input(s): IRON, TIBC, TRANSFERRIN, FERRITIN in the last 72 hours. Studies/Results: No results found.  aspirin  EC  81 mg Oral Daily   atorvastatin   10 mg Oral Daily   Chlorhexidine  Gluconate Cloth  6 each Topical Daily   gabapentin   400 mg Oral TID   heparin   5,000 Units Subcutaneous Q8H   insulin  aspart  0-15 Units Subcutaneous TID WC   melatonin  3 mg Oral QHS   montelukast   10 mg Oral QHS   pantoprazole   40 mg Oral Daily   tamsulosin   0.4 mg Oral QHS    BMET    Component Value Date/Time   NA 131 (L) 03/08/2024 1328   K 4.0 03/06/2024 1723   CL 88 (L) 03/06/2024 1723   CO2 23 03/06/2024 1723   GLUCOSE 121 (H) 03/06/2024 1723   BUN <5 (L) 03/06/2024 1723   CREATININE 0.84 03/07/2024 0221   CREATININE 0.94 02/25/2024 1111   CALCIUM  9.5 03/06/2024 1723   GFRNONAA >60 03/07/2024 0221   GFRNONAA 68 01/21/2021 0923   GFRAA 78 01/21/2021 0923   CBC    Component Value Date/Time   WBC 5.7 03/07/2024 0404   RBC 3.87 (L) 03/07/2024 0404   HGB 11.2 (L) 03/07/2024 0404   HCT 32.2 (L) 03/07/2024 0404   PLT 262 03/07/2024 0404   MCV 83.2 03/07/2024 0404   MCH 28.9 03/07/2024 0404   MCHC 34.8 03/07/2024 0404   RDW 12.2 03/07/2024 0404   LYMPHSABS 1,734  05/06/2023 1501   MONOABS 0.7 01/11/2021 1433   EOSABS 160 02/25/2024 1111   BASOSABS 50 02/25/2024 1111    Assessment/Plan:  Acute on chronic hyponatremia - he has been hospitalized 3 times in the past 4 months with similar presentation, although he had N/V/D on the previous 2.  Sosm low at 249, Uosm high at 504 and UNa high at 91, however these were drawn 2 hours after receiving 500 mL of IV NS and 100 mL of 3% saline.  His repeat Na dropped to 117 and he was transferred to the ICU and started on 3% saline at 45 mL/hr with improvement of symptoms and Na to 132 on 03/07/24 and 3% and NaCl tabs 1 gm bid were stopped last night.  Cortisol was 14.5 so WNL.  TSH was WNL.  No SSRI but is on Wellbutrin  (although rarely causes hyponatremia) which was stopped today  by PCCM.  Concerning that this is a recurring issue.  He is currently asymptomatic.  Na level stable at 131 off of 3% and NaCl.  If starts to drop again would resume NaCl tabs.  Will recheck Sosm, Uosm, and UNa now that he is off of treatment.  Continue to follow Na levels every 3 hours. Hypoglycemia - hold metformin  for now and check cortisol as above. HTN - Bp was low yesterday but responded to IVF. DM type 2 - per primary.  Hold metformin  as above.   Fairy RONAL Sellar, MD [employer] [phone]

## 2024-03-09 ENCOUNTER — Ambulatory Visit: Payer: Medicare Other | Admitting: Internal Medicine

## 2024-03-09 DIAGNOSIS — I10 Essential (primary) hypertension: Secondary | ICD-10-CM | POA: Diagnosis not present

## 2024-03-09 DIAGNOSIS — E871 Hypo-osmolality and hyponatremia: Secondary | ICD-10-CM | POA: Diagnosis not present

## 2024-03-09 DIAGNOSIS — E119 Type 2 diabetes mellitus without complications: Secondary | ICD-10-CM | POA: Diagnosis not present

## 2024-03-09 LAB — CBC
HCT: 35.9 % — ABNORMAL LOW (ref 39.0–52.0)
Hemoglobin: 12.2 g/dL — ABNORMAL LOW (ref 13.0–17.0)
MCH: 29.2 pg (ref 26.0–34.0)
MCHC: 34 g/dL (ref 30.0–36.0)
MCV: 85.9 fL (ref 80.0–100.0)
Platelets: 252 10*3/uL (ref 150–400)
RBC: 4.18 MIL/uL — ABNORMAL LOW (ref 4.22–5.81)
RDW: 13.1 % (ref 11.5–15.5)
WBC: 5.8 10*3/uL (ref 4.0–10.5)
nRBC: 0 % (ref 0.0–0.2)

## 2024-03-09 LAB — GLUCOSE, CAPILLARY
Glucose-Capillary: 187 mg/dL — ABNORMAL HIGH (ref 70–99)
Glucose-Capillary: 269 mg/dL — ABNORMAL HIGH (ref 70–99)
Glucose-Capillary: 152 mg/dL — ABNORMAL HIGH (ref 70–99)
Glucose-Capillary: 212 mg/dL — ABNORMAL HIGH (ref 70–99)
Glucose-Capillary: 281 mg/dL — ABNORMAL HIGH (ref 70–99)

## 2024-03-09 LAB — RENAL FUNCTION PANEL
Albumin: 3.8 g/dL (ref 3.5–5.0)
Anion gap: 10 (ref 5–15)
BUN: 9 mg/dL (ref 8–23)
CO2: 20 mmol/L — ABNORMAL LOW (ref 22–32)
Calcium: 9.7 mg/dL (ref 8.9–10.3)
Chloride: 102 mmol/L (ref 98–111)
Creatinine, Ser: 0.93 mg/dL (ref 0.61–1.24)
GFR, Estimated: >60 mL/min (ref >60)
Glucose, Bld: 204 mg/dL — ABNORMAL HIGH (ref 70–99)
Phosphorus: 3.9 mg/dL (ref 2.5–4.6)
Potassium: 4.1 mmol/L (ref 3.5–5.1)
Sodium: 132 mmol/L — ABNORMAL LOW (ref 135–145)

## 2024-03-09 LAB — SODIUM: Sodium: 135 mmol/L (ref 135–145)

## 2024-03-09 MED ORDER — SODIUM CHLORIDE 1 G PO TABS
1.0000 g | ORAL_TABLET | Freq: Two times a day (BID) | ORAL | Status: DC
  Administered 2024-03-09 – 2024-03-10 (×3): 1 g via ORAL
  Filled 2024-03-09 (×3): qty 1

## 2024-03-09 NOTE — Progress Notes (Signed)
 Progress Note   Patient: Michael Guzman FMW:[id number] DOB: 09-27-1945 DOA: 03/05/2024  DOS: the patient was seen and examined on 03/09/2024   Brief hospital course:  78 y/o male with PMH for DM type 2, HTN, HLD, h/o prostate cancer, ASCAD s/p PTCA, OSA, fatty liver disease, GERD, and history of hyponatremia who presented to [facility] on 6/22 with generalized weakness x 4 days, serum sodium 117 at that time.   Assessment and Plan:  Acute on chronic hyponatremia - Initially requiring 3% saline in the ICU.  Serum sodium is improved with removal of hypertonic saline transition to p.o. salt tabs.  Followed closely by nephrology.  Serum sodium 132 this a.m.  Continues on salt tabs, will recheck BMP every 12 hours.  Underlying etiology likely multifactorial given possible low solute intake combined with antidepressant use.  Grief reaction/depression - Wellbutrin  discontinued given above.  Hypertension/CAD/HLD - Antihypertensives currently on hold given soft BPs.  Continue to monitor closely.  Diabetes mellitus - Insulin  sliding scale on board.  Physical debilitation muscle weakness - Will order PT consult.  Subjective: Patient feeling improved this morning.  Denies any fever, chills, chest pain, nausea, vomiting, abdominal pain.  States he feels a bit weak.  Otherwise improving.  Physical Exam:  Vitals:   03/08/24 1933 03/08/24 2314 03/09/24 0326 03/09/24 0712  BP: 135/66 132/67 118/69 130/70  Pulse: 64 (!) 58 70 68  Resp: 17 17 17    Temp: 97.8 F (36.6 C) 97.9 F (36.6 C) 97.6 F (36.4 C) 98.3 F (36.8 C)  TempSrc: Oral Oral Oral Oral  SpO2: 100% 100% 100% 100%  Weight:      Height:        GENERAL:  Alert, pleasant, no acute distress  HEENT:  EOMI CARDIOVASCULAR:  RRR, no murmurs appreciated RESPIRATORY:  Clear to auscultation, no wheezing, rales, or rhonchi GASTROINTESTINAL:  Soft, nontender, nondistended EXTREMITIES:  No LE edema bilaterally NEURO:  No new focal deficits  appreciated SKIN:  No rashes noted PSYCH:  Appropriate mood and affect     Data Reviewed:  No new imaging to review  Previous records (including but not limited to H&P, progress notes, nursing notes, TOC management) were reviewed in assessment of this patient.  Labs: CBC: Recent Labs  Lab 03/05/24 1820 03/07/24 0221 03/07/24 0404 03/09/24 0444  WBC 5.1 5.5 5.7 5.8  HGB 13.2 11.5* 11.2* 12.2*  HCT 36.8* 33.0* 32.2* 35.9*  MCV 80.2 83.3 83.2 85.9  PLT 286 246 262 252   Basic Metabolic Panel: Recent Labs  Lab 03/05/24 1820 03/05/24 1911 03/05/24 2043 03/05/24 2232 03/06/24 1723 03/06/24 2212 03/07/24 0221 03/07/24 0404 03/07/24 0907 03/08/24 0448 03/08/24 0737 03/08/24 1328 03/08/24 1929 03/09/24 0444  NA 117*  --  119*   < > 121*   < > 118* 117*   < > 130* 129* 131* 132* 132*  K 4.2  --  4.0  --  4.0  --   --   --   --   --   --   --   --  4.1  CL 83*  --  87*  --  88*  --   --   --   --   --   --   --   --  102  CO2 20*  --  20*  --  23  --   --   --   --   --   --   --   --  20*  GLUCOSE 158*  --  104*  --  121*  --   --   --   --   --   --   --   --  204*  BUN 7*  --  7*  --  <5*  --   --   --   --   --   --   --   --  9  CREATININE 0.88  --  0.83  --  0.78  --  0.84  --   --   --   --   --   --  0.93  CALCIUM  9.5  --  9.2  --  9.5  --   --   --   --   --   --   --   --  9.7  MG  --  1.7  --   --   --   --   --  1.8  --   --   --   --   --   --   PHOS  --  2.5  --   --   --   --   --   --   --   --   --   --   --  3.9   < > = values in this interval not displayed.   Liver Function Tests: Recent Labs  Lab 03/05/24 1820 03/09/24 0444  AST 20  --   ALT 15  --   ALKPHOS 66  --   BILITOT 0.5  --   PROT 7.8  --   ALBUMIN 4.8 3.8   CBG: Recent Labs  Lab 03/08/24 1154 03/08/24 1630 03/08/24 2108 03/09/24 0622 03/09/24 0843  GLUCAP 170* 162* 198* 187* 269*    Scheduled Meds:  aspirin  EC  81 mg Oral Daily   atorvastatin   10 mg Oral Daily    Chlorhexidine  Gluconate Cloth  6 each Topical Daily   gabapentin   400 mg Oral TID   heparin   5,000 Units Subcutaneous Q8H   insulin  aspart  0-15 Units Subcutaneous TID WC   melatonin  3 mg Oral QHS   montelukast   10 mg Oral QHS   pantoprazole   40 mg Oral Daily   sodium chloride   1 g Oral BID WC   tamsulosin   0.4 mg Oral QHS   Continuous Infusions: PRN Meds:.acetaminophen   Family Communication: None at bedside  Disposition: Status is: Inpatient Remains inpatient appropriate because: Hyponatremia     Time spent: 37 minutes  Length of inpatient stay: 3 days  Author: Carliss LELON Canales, DO 03/09/2024 11:02 AM  For on call review [email or web address].

## 2024-03-09 NOTE — Plan of Care (Signed)
  Problem: Education: Goal: Knowledge of General Education information will improve Description: Including pain rating scale, medication(s)/side effects and non-pharmacologic comfort measures 03/09/2024 1921 by Lynnette Cena CROME, RN Outcome: Progressing 03/09/2024 1734 by Lynnette Cena CROME, RN Outcome: Progressing   Problem: Health Behavior/Discharge Planning: Goal: Ability to manage health-related needs will improve Outcome: Progressing

## 2024-03-09 NOTE — Progress Notes (Signed)
 Patient ID: Michael Guzman, male   DOB: [birth year], 78 y.o.   MRN: [id number] S: No new complaints O:BP 130/70   Pulse 68   Temp 98.3 F (36.8 C) (Oral)   Resp 17   Ht 5' 10 (1.778 m)   Wt 83.8 kg   SpO2 100%   BMI 26.51 kg/m   Intake/Output Summary (Last 24 hours) at 03/09/2024 9060 Last data filed at 03/08/2024 1400 Gross per 24 hour  Intake --  Output 600 ml  Net -600 ml   Intake/Output: I/O last 3 completed shifts: In: 240 [P.O.:240] Out: 3280 [Urine:3280]  Intake/Output this shift:  No intake/output data recorded. Weight change:  Gen: NAD CVS: RRR Resp:CTA Abd: +BS, soft, NT/ND Ext: no edema  Recent Labs  Lab 03/05/24 1820 03/05/24 1911 03/05/24 2043 03/05/24 2232 03/06/24 1723 03/06/24 2212 03/07/24 0221 03/07/24 0404 03/07/24 2053 03/08/24 0048 03/08/24 0448 03/08/24 0737 03/08/24 1328 03/08/24 1929 03/09/24 0444  NA 117*  --  119*   < > 121*   < > 118*   < > 132* 130* 130* 129* 131* 132* 132*  K 4.2  --  4.0  --  4.0  --   --   --   --   --   --   --   --   --  4.1  CL 83*  --  87*  --  88*  --   --   --   --   --   --   --   --   --  102  CO2 20*  --  20*  --  23  --   --   --   --   --   --   --   --   --  20*  GLUCOSE 158*  --  104*  --  121*  --   --   --   --   --   --   --   --   --  204*  BUN 7*  --  7*  --  <5*  --   --   --   --   --   --   --   --   --  9  CREATININE 0.88  --  0.83  --  0.78  --  0.84  --   --   --   --   --   --   --  0.93  ALBUMIN 4.8  --   --   --   --   --   --   --   --   --   --   --   --   --  3.8  CALCIUM  9.5  --  9.2  --  9.5  --   --   --   --   --   --   --   --   --  9.7  PHOS  --  2.5  --   --   --   --   --   --   --   --   --   --   --   --  3.9  AST 20  --   --   --   --   --   --   --   --   --   --   --   --   --   --   ALT 15  --   --   --   --   --   --   --   --   --   --   --   --   --   --    < > =  values in this interval not displayed.   Liver Function Tests: Recent Labs  Lab 03/05/24 1820  03/09/24 0444  AST 20  --   ALT 15  --   ALKPHOS 66  --   BILITOT 0.5  --   PROT 7.8  --   ALBUMIN 4.8 3.8   No results for input(s): LIPASE, AMYLASE in the last 168 hours. No results for input(s): AMMONIA in the last 168 hours. CBC: Recent Labs  Lab 03/05/24 1820 03/07/24 0221 03/07/24 0404 03/09/24 0444  WBC 5.1 5.5 5.7 5.8  HGB 13.2 11.5* 11.2* 12.2*  HCT 36.8* 33.0* 32.2* 35.9*  MCV 80.2 83.3 83.2 85.9  PLT 286 246 262 252   Cardiac Enzymes: No results for input(s): CKTOTAL, CKMB, CKMBINDEX, TROPONINI in the last 168 hours. CBG: Recent Labs  Lab 03/08/24 1154 03/08/24 1630 03/08/24 2108 03/09/24 0622 03/09/24 0843  GLUCAP 170* 162* 198* 187* 269*    Iron Studies: No results for input(s): IRON, TIBC, TRANSFERRIN, FERRITIN in the last 72 hours. Studies/Results: No results found.  aspirin  EC  81 mg Oral Daily   atorvastatin   10 mg Oral Daily   Chlorhexidine  Gluconate Cloth  6 each Topical Daily   gabapentin   400 mg Oral TID   heparin   5,000 Units Subcutaneous Q8H   insulin  aspart  0-15 Units Subcutaneous TID WC   melatonin  3 mg Oral QHS   montelukast   10 mg Oral QHS   pantoprazole   40 mg Oral Daily   tamsulosin   0.4 mg Oral QHS    BMET    Component Value Date/Time   NA 132 (L) 03/09/2024 0444   K 4.1 03/09/2024 0444   CL 102 03/09/2024 0444   CO2 20 (L) 03/09/2024 0444   GLUCOSE 204 (H) 03/09/2024 0444   BUN 9 03/09/2024 0444   CREATININE 0.93 03/09/2024 0444   CREATININE 0.94 02/25/2024 1111   CALCIUM  9.7 03/09/2024 0444   GFRNONAA >60 03/09/2024 0444   GFRNONAA 68 01/21/2021 0923   GFRAA 78 01/21/2021 0923   CBC    Component Value Date/Time   WBC 5.8 03/09/2024 0444   RBC 4.18 (L) 03/09/2024 0444   HGB 12.2 (L) 03/09/2024 0444   HCT 35.9 (L) 03/09/2024 0444   PLT 252 03/09/2024 0444   MCV 85.9 03/09/2024 0444   MCH 29.2 03/09/2024 0444   MCHC 34.0 03/09/2024 0444   RDW 13.1 03/09/2024 0444   LYMPHSABS 1,734  05/06/2023 1501   MONOABS 0.7 01/11/2021 1433   EOSABS 160 02/25/2024 1111   BASOSABS 50 02/25/2024 1111    Assessment/Plan:  Acute on chronic hyponatremia - he has been hospitalized 3 times in the past 4 months with similar presentation, although he had N/V/D on the previous 2.  Sosm low at 249, Uosm high at 504 and UNa high at 91, however these were drawn 2 hours after receiving 500 mL of IV NS and 100 mL of 3% saline.  His repeat Na dropped to 117 and he was transferred to the ICU and started on 3% saline at 45 mL/hr with improvement of symptoms and Na to 132 on 03/07/24 and 3% and NaCl tabs 1 gm bid were stopped.  Cortisol was 14.5 so WNL.  TSH was WNL.  No SSRI but is on Wellbutrin  (although rarely causes hyponatremia) which was stopped by PCCM.  Concerning that this is a recurring issue.  He is currently asymptomatic.  Na level stable at 132 off of 3% and NaCl.  Will resume NaCl tabs.  Will check Na levels every 12 hours. Hypoglycemia - hold metformin  for now and check cortisol as above. HTN - Bp was low yesterday but responded to IVF. DM type 2 - per primary.  Hold metformin  as above.   Fairy RONAL Sellar, MD [facility]

## 2024-03-09 NOTE — Plan of Care (Signed)
   Problem: Education: Goal: Knowledge of General Education information will improve Description Including pain rating scale, medication(s)/side effects and non-pharmacologic comfort measures Outcome: Progressing

## 2024-03-09 NOTE — Hospital Course (Signed)
 78 y/o male with PMH for DM type 2, HTN, HLD, h/o prostate cancer, ASCAD s/p PTCA, OSA, fatty liver disease, GERD, and history of hyponatremia who presented to [facility] on 6/22 with generalized weakness x 4 days, serum sodium 117 at that time.    Assessment and Plan:   Acute on chronic hyponatremia - Initially requiring 3% saline in the ICU.  Serum sodium is improved with removal of hypertonic saline transition to p.o. salt tabs.  Followed closely by nephrology.  Serum sodium 132 this a.m.  Continues on salt tabs, will recheck BMP every 12 hours.  Underlying etiology likely multifactorial given possible low solute intake combined with antidepressant use.   Grief reaction/depression - Wellbutrin  discontinued given above.   Hypertension/CAD/HLD - Antihypertensives currently on hold given soft BPs.  Continue to monitor closely.   Diabetes mellitus - Insulin  sliding scale on board.   Physical debilitation muscle weakness - Will order PT consult.

## 2024-03-09 NOTE — Evaluation (Signed)
 Physical Therapy Brief Evaluation and Discharge Note Patient Details Name: Michael Guzman MRN: [id number] DOB: 31-Oct-1945 Today's Date: 03/09/2024   History of Present Illness  78 y/o male who presented to [facility] on 6/22 with generalized weakness x 4 days, serum sodium 117 at that time. PMH for DM type 2, HTN, HLD, h/o prostate cancer, ASCAD s/p PTCA, OSA, fatty liver disease, GERD, and history of hyponatremia.  Clinical Impression  Pt presents with admitting diagnosis above. Pt today was able to ambulate in hallway and navigate stairs with no AD supervision. PTA pt was fully independent however would use SPC for longer distances. Pt presents at or near baseline mobility. Pt has no further acute PT needs and will be signing off. Recommend OPPT upon DC. Re consult PT if mobility status changes. Pt would benefit from continued mobility with mobility specialist during acute stay.        PT Assessment All further PT needs can be met in the next venue of care  Assistance Needed at Discharge  PRN    Equipment Recommendations None recommended by PT  Recommendations for Other Services       Precautions/Restrictions Precautions Precautions: Fall Recall of Precautions/Restrictions: Intact Restrictions Weight Bearing Restrictions Per Provider Order: No        Mobility  Bed Mobility   Supine/Sidelying to sit: Modified independent (Device/Increased time) Sit to supine/sidelying: Modified independent (Device/Increased time)    Transfers Overall transfer level: Independent Equipment used: None                    Ambulation/Gait Ambulation/Gait assistance: Supervision Gait Distance (Feet): 250 Feet Assistive device: None Gait Pattern/deviations: WFL(Within Functional Limits) Gait Speed: Pace WFL General Gait Details: Slowed step through pattern.  Home Activity Instructions    Stairs Stairs: Yes Stairs assistance: Supervision Stair Management: Two rails, Forwards,  Alternating pattern Number of Stairs: 2 General stair comments: no LOB noted.  Modified Rankin (Stroke Patients Only)        Balance Overall balance assessment: No apparent balance deficits (not formally assessed)                        Pertinent Vitals/Pain PT - Brief Vital Signs All Vital Signs Stable: Yes Pain Assessment Pain Assessment: No/denies pain     Home Living Family/patient expects to be discharged to:: Private residence Living Arrangements: Alone Available Help at Discharge: Family;Available 24 hours/day Home Environment: Stairs to enter  [employer] of Steps: 3 Home Equipment: [place] - single point;Hand held Stage manager (4 wheels)   Additional Comments: son just moved in    Prior Function Level of Independence: Independent with assistive device(s) Comments: Ind no AD however uses SPC for longer distances.    UE/LE Assessment   UE ROM/Strength/Tone/Coordination: WFL    LE ROM/Strength/Tone/Coordination: [facility]      Communication   Communication Communication: No apparent difficulties     Cognition Overall Cognitive Status: Appears within functional limits for tasks assessed/performed       General Comments General comments (skin integrity, edema, etc.): VSS    Exercises     Assessment/Plan    PT Problem List Decreased range of motion;Decreased strength;Decreased activity tolerance;Decreased balance;Decreased mobility;Decreased coordination;Decreased knowledge of use of DME;Decreased safety awareness;Decreased knowledge of precautions       PT Visit Diagnosis Other abnormalities of gait and mobility (R26.89)    No Skilled PT     Co-evaluation  AMPAC 6 Clicks Help needed turning from your back to your side while in a flat bed without using bedrails?: None Help needed moving from lying on your back to sitting on the side of a flat bed without using bedrails?: None Help needed moving to and from a bed  to a chair (including a wheelchair)?: None Help needed standing up from a chair using your arms (e.g., wheelchair or bedside chair)?: None Help needed to walk in hospital room?: A Little Help needed climbing 3-5 steps with a railing? : A Little 6 Click Score: 22      End of Session Equipment Utilized During Treatment: Gait belt Activity Tolerance: Patient tolerated treatment well Patient left: in bed;with call bell/phone within reach Nurse Communication: Mobility status PT Visit Diagnosis: Other abnormalities of gait and mobility (R26.89)     Time: 8894-8878 PT Time Calculation (min) (ACUTE ONLY): 16 min  Charges:   PT Evaluation $PT Eval Moderate Complexity: 1 Mod      Michael Guzman, PT, DPT Acute Rehab Services [phone]   Michael Guzman  03/09/2024, 11:39 AM

## 2024-03-09 NOTE — Care Management Important Message (Signed)
 Important Message  Patient Details  Name: Michael Guzman MRN: [id number] Date of Birth: 10/25/1945   Important Message Given:  Yes - Medicare IM     Claretta Deed 03/09/2024, 4:04 PM

## 2024-03-10 DIAGNOSIS — I10 Essential (primary) hypertension: Secondary | ICD-10-CM | POA: Diagnosis not present

## 2024-03-10 DIAGNOSIS — E871 Hypo-osmolality and hyponatremia: Secondary | ICD-10-CM | POA: Diagnosis not present

## 2024-03-10 LAB — RENAL FUNCTION PANEL
Albumin: 3.6 g/dL (ref 3.5–5.0)
Anion gap: 11 (ref 5–15)
BUN: 10 mg/dL (ref 8–23)
CO2: 23 mmol/L (ref 22–32)
Calcium: 9.9 mg/dL (ref 8.9–10.3)
Chloride: 100 mmol/L (ref 98–111)
Creatinine, Ser: 1.04 mg/dL (ref 0.61–1.24)
GFR, Estimated: >60 mL/min (ref >60)
Glucose, Bld: 232 mg/dL — ABNORMAL HIGH (ref 70–99)
Phosphorus: 3.9 mg/dL (ref 2.5–4.6)
Potassium: 4.2 mmol/L (ref 3.5–5.1)
Sodium: 134 mmol/L — ABNORMAL LOW (ref 135–145)

## 2024-03-10 LAB — GLUCOSE, CAPILLARY
Glucose-Capillary: 197 mg/dL — ABNORMAL HIGH (ref 70–99)
Glucose-Capillary: 245 mg/dL — ABNORMAL HIGH (ref 70–99)

## 2024-03-10 MED ORDER — SODIUM CHLORIDE 1 G PO TABS: 1.0000 g | ORAL_TABLET | Freq: Two times a day (BID) | ORAL | 0 refills | Status: DC

## 2024-03-10 NOTE — Discharge Summary (Signed)
 Physician Discharge Summary   Patient: Michael Guzman MRN: [id number] DOB: Nov 20, 1945  Admit date:     03/05/2024  Discharge date: 03/10/24  Discharge Physician: Carliss LELON Canales   PCP: [name], [name]   Recommendations at discharge:    Pt to be discharged home.   If you experience worsening fever, chills, chest pain, shortness of breath, or other concerning symptoms, please call your PCP or go to the emergency department immediately.  Discharge Diagnoses: Principal Problem:   Hyponatremia  Resolved Problems:   * No resolved hospital problems. *   Hospital Course:  78 y/o male with PMH for DM type 2, HTN, HLD, h/o prostate cancer, ASCAD s/p PTCA, OSA, fatty liver disease, GERD, and history of hyponatremia who presented to [facility] on 6/22 with generalized weakness x 4 days, serum sodium 117 at that time.    Assessment and Plan:   Acute on chronic hyponatremia - Initially requiring 3% saline in the ICU.  Serum sodium is improved with removal of hypertonic saline transition to p.o. salt tabs.  Followed closely by nephrology.  Serum sodium 134 this a.m.  Underlying etiology likely multifactorial given possible low solute intake combined with antidepressant use.  Will DC with prescription for salt tabs to take as directed.  Recommend follow-up with primary care provider in 1 to 2 weeks.   Grief reaction/depression - Wellbutrin  discontinued given above. Follow-up with PCP and possibly behavioral health in the outpatient setting.   Hypertension/CAD/HLD - Antihypertensives initially held due to soft blood pressures likely from hypovolemia.  Recommend restarting home regimen upon discharge.   Diabetes mellitus - Resume home regimen   Physical debilitation muscle weakness - Evaluated by physical therapy.  Recommending outpatient PT.   Consultants: Nephrology Procedures performed: None Disposition: Home Diet recommendation:  Discharge Diet Orders (From admission, onward)      Start     Ordered   03/10/24 0000  Diet - low sodium heart healthy        03/10/24 1035           Cardiac and Carb modified diet  DISCHARGE MEDICATION: Allergies as of 03/10/2024   No Known Allergies      Medication List     STOP taking these medications    buPROPion  300 MG 24 hr tablet Commonly known as: WELLBUTRIN  XL       TAKE these medications    aspirin  EC 81 MG tablet Take 1 tablet (81 mg total) by mouth daily. What changed: when to take this   atorvastatin  10 MG tablet Commonly known as: LIPITOR  TAKE 1 TABLET BY MOUTH DAILY What changed: when to take this   gabapentin  800 MG tablet Commonly known as: NEURONTIN  TAKE 1 TABLET BY MOUTH 3 TO 4 TIMES DAILY FOR PAIN, HOT FLASHES AND SWEATS   glycopyrrolate  2 MG tablet Commonly known as: ROBINUL  Take  1 tablet  3 x / day for Excessive Sweating  TAKE ONE TABLET BY                                                 MOUTH What changed:  how much to take how to take this when to take this additional instructions   HumuLIN  70/30 (70-30) 100 UNIT/ML injection Generic drug: insulin  NPH-regular Human INJECT SUBCUTANEOUSLY 50  UNITS TWICE DAILY What changed:  how to take  this when to take this additional instructions   Insulin  Syringes (Disposable) U-100 1 ML Misc 50 Units by Does not apply route 2 (two) times daily. inject 50 units into skin 2 x /day   isosorbide  mononitrate 30 MG 24 hr tablet Commonly known as: IMDUR  Take 1 tablet (30 mg total) by mouth daily. What changed:  when to take this reasons to take this   latanoprost  0.005 % ophthalmic solution Commonly known as: XALATAN  Place 1 drop into both eyes at bedtime.   Magnesium  500 MG Tabs Take 500 mg by mouth in the morning.   metFORMIN  500 MG 24 hr tablet Commonly known as: GLUCOPHAGE -XR TAKE 2 TABLETS BY MOUTH TWICE  DAILY WITH MEALS FOR DIABETES What changed: See the new instructions.   metoprolol  succinate 25 MG 24 hr tablet Commonly  known as: TOPROL -XL TAKE ONE-HALF TABLET BY MOUTH  DAILY What changed: when to take this   montelukast  10 MG tablet Commonly known as: SINGULAIR  TAKE 1 TABLET BY MOUTH  DAILY FOR ALLERGIES What changed:  how much to take how to take this when to take this additional instructions   nitroGLYCERIN  0.4 MG SL tablet Commonly known as: NITROSTAT  DISSOLVE 1 TABLET UNDER THE  TONGUE EVERY 5 MINUTES AS NEEDED FOR CHEST PAIN. MAX OF 3 TABLETS IN 15 MINUTES. CALL 911 IF PAIN  PERSISTS.   ONE TOUCH ULTRA 2 w/Device Kit Use as directed   OneTouch Delica Plus Lancet33G Misc CHECK BLOOD SUGAR 3 TIMES  DAILY   OneTouch Ultra test strip Generic drug: glucose blood CHECK BLOOD SUGAR 3 TIMES  DAILY   pantoprazole  40 MG tablet Commonly known as: PROTONIX  TAKE 1 TABLET BY MOUTH  DAILY TO PREVENT HEARTBURN  AND INDIGESTION What changed:  how much to take how to take this when to take this additional instructions   sodium chloride  1 g tablet Take 1 tablet (1 g total) by mouth 2 (two) times daily with a meal.   SUPER B COMPLEX PO Take 1 tablet by mouth in the morning.   tamsulosin  0.4 MG Caps capsule Commonly known as: FLOMAX  Take 0.4 mg by mouth at bedtime.   vitamin C 1000 MG tablet Take 1,000 mg by mouth in the morning.   Vitamin D3 250 MCG (10000 UT) capsule Take 10,000 Units by mouth in the morning.   Vitamin E 450 MG (1000 UT) Caps Take 450 mg by mouth in the morning.   zinc gluconate 50 MG tablet Take 50 mg by mouth in the morning.         Discharge Exam: Filed Weights   03/07/24 0423 03/08/24 0500 03/09/24 0700  Weight: 86.7 kg 83.8 kg 83.8 kg    GENERAL:  Alert, pleasant, no acute distress  HEENT:  EOMI CARDIOVASCULAR:  RRR, no murmurs appreciated RESPIRATORY:  Clear to auscultation, no wheezing, rales, or rhonchi GASTROINTESTINAL:  Soft, nontender, nondistended EXTREMITIES:  No LE edema bilaterally NEURO:  No new focal deficits appreciated SKIN:  No  rashes noted PSYCH:  Appropriate mood and affect     Condition at discharge: improving  The results of significant diagnostics from this hospitalization (including imaging, microbiology, ancillary and laboratory) are listed below for reference.   Imaging Studies: No results found.  Microbiology: Results for orders placed or performed during the hospital encounter of 03/05/24  Resp panel by RT-PCR (RSV, Flu A&B, Covid) Anterior Nasal Swab     Status: None   Collection Time: 03/05/24  7:01 PM   Specimen: Anterior  Nasal Swab  Result Value Ref Range Status   SARS Coronavirus 2 by RT PCR NEGATIVE NEGATIVE Final    Comment: (NOTE) SARS-CoV-2 target nucleic acids are NOT DETECTED.  The SARS-CoV-2 RNA is generally detectable in upper respiratory specimens during the acute phase of infection. The lowest concentration of SARS-CoV-2 viral copies this assay can detect is 138 copies/mL. A negative result does not preclude SARS-Cov-2 infection and should not be used as the sole basis for treatment or other patient management decisions. A negative result may occur with  improper specimen collection/handling, submission of specimen other than nasopharyngeal swab, presence of viral mutation(s) within the areas targeted by this assay, and inadequate number of viral copies(<138 copies/mL). A negative result must be combined with clinical observations, patient history, and epidemiological information. The expected result is Negative.  Fact Sheet for Patients:  BloggerCourse.com  Fact Sheet for Healthcare Providers:  SeriousBroker.it  This test is no t yet approved or cleared by the [place]  FDA and  has been authorized for detection and/or diagnosis of SARS-CoV-2 by FDA under an Emergency Use Authorization (EUA). This EUA will remain  in effect (meaning this test can be used) for the duration of the COVID-19 declaration under Section  564(b)(1) of the Act, 21 U.S.C.section 360bbb-3(b)(1), unless the authorization is terminated  or revoked sooner.       Influenza A by PCR NEGATIVE NEGATIVE Final   Influenza B by PCR NEGATIVE NEGATIVE Final    Comment: (NOTE) The Xpert Xpress SARS-CoV-2/FLU/RSV plus assay is intended as an aid in the diagnosis of influenza from Nasopharyngeal swab specimens and should not be used as a sole basis for treatment. Nasal washings and aspirates are unacceptable for Xpert Xpress SARS-CoV-2/FLU/RSV testing.  Fact Sheet for Patients: BloggerCourse.com  Fact Sheet for Healthcare Providers: SeriousBroker.it  This test is not yet approved or cleared by the [place]  FDA and has been authorized for detection and/or diagnosis of SARS-CoV-2 by FDA under an Emergency Use Authorization (EUA). This EUA will remain in effect (meaning this test can be used) for the duration of the COVID-19 declaration under Section 564(b)(1) of the Act, 21 U.S.C. section 360bbb-3(b)(1), unless the authorization is terminated or revoked.     Resp Syncytial Virus by PCR NEGATIVE NEGATIVE Final    Comment: (NOTE) Fact Sheet for Patients: BloggerCourse.com  Fact Sheet for Healthcare Providers: SeriousBroker.it  This test is not yet approved or cleared by the [place]  FDA and has been authorized for detection and/or diagnosis of SARS-CoV-2 by FDA under an Emergency Use Authorization (EUA). This EUA will remain in effect (meaning this test can be used) for the duration of the COVID-19 declaration under Section 564(b)(1) of the Act, 21 U.S.C. section 360bbb-3(b)(1), unless the authorization is terminated or revoked.  Performed at [facility], [address] [place], [place] [address]   MRSA Next Gen by PCR, Nasal     Status: None   Collection Time: 03/07/24  2:08 AM   Specimen: Nasal  Mucosa; Nasal Swab  Result Value Ref Range Status   MRSA by PCR Next Gen NOT DETECTED NOT DETECTED Final    Comment: (NOTE) The GeneXpert MRSA Assay (FDA approved for NASAL specimens only), is one component of a comprehensive MRSA colonization surveillance program. It is not intended to diagnose MRSA infection nor to guide or monitor treatment for MRSA infections. Test performance is not FDA approved in patients less than 28 years old. Performed at [facility], [address] [place], [place] [address]     Labs: CBC: Recent Labs  Lab 03/05/24 1820 03/07/24 0221 03/07/24 0404 03/09/24 0444  WBC 5.1 5.5 5.7 5.8  HGB 13.2 11.5* 11.2* 12.2*  HCT 36.8* 33.0* 32.2* 35.9*  MCV 80.2 83.3 83.2 85.9  PLT 286 246 262 252   Basic Metabolic Panel: Recent Labs  Lab 03/05/24 1820 03/05/24 1911 03/05/24 2043 03/05/24 2232 03/06/24 1723 03/06/24 2212 03/07/24 0221 03/07/24 0404 03/07/24 0907 03/08/24 1328 03/08/24 1929 03/09/24 0444 03/09/24 1731 03/10/24 0435  NA 117*  --  119*   < > 121*   < > 118* 117*   < > 131* 132* 132* 135 134*  K 4.2  --  4.0  --  4.0  --   --   --   --   --   --  4.1  --  4.2  CL 83*  --  87*  --  88*  --   --   --   --   --   --  102  --  100  CO2 20*  --  20*  --  23  --   --   --   --   --   --  20*  --  23  GLUCOSE 158*  --  104*  --  121*  --   --   --   --   --   --  204*  --  232*  BUN 7*  --  7*  --  <5*  --   --   --   --   --   --  9  --  10  CREATININE 0.88  --  0.83  --  0.78  --  0.84  --   --   --   --  0.93  --  1.04  CALCIUM  9.5  --  9.2  --  9.5  --   --   --   --   --   --  9.7  --  9.9  MG  --  1.7  --   --   --   --   --  1.8  --   --   --   --   --   --   PHOS  --  2.5  --   --   --   --   --   --   --   --   --  3.9  --  3.9   < > = values in this interval not displayed.   Liver Function Tests: Recent Labs  Lab 03/05/24 1820 03/09/24 0444 03/10/24 0435  AST 20  --   --   ALT 15  --   --   ALKPHOS 66  --   --    BILITOT 0.5  --   --   PROT 7.8  --   --   ALBUMIN 4.8 3.8 3.6   CBG: Recent Labs  Lab 03/09/24 0843 03/09/24 1203 03/09/24 1606 03/09/24 2123 03/10/24 0607  GLUCAP 269* 152* 212* 281* 245*    Discharge time spent: 28 minutes.  Length of inpatient stay: 4 days  Signed: Carliss LELON Canales, DO [facility] 03/10/2024

## 2024-03-10 NOTE — Progress Notes (Signed)
 Patient ID: Michael Guzman, male   DOB: August 18, 1946, 78 y.o.   MRN: [id number] S: No new complaints or events overnight. O:BP 130/74 (BP Location: Left Arm)   Pulse 62   Temp 97.8 F (36.6 C) (Oral)   Resp 16   Ht 5' 10 (1.778 m)   Wt 83.8 kg   SpO2 100%   BMI 26.51 kg/m  No intake or output data in the 24 hours ending 03/10/24 1034 Intake/Output: No intake/output data recorded.  Intake/Output this shift:  No intake/output data recorded. Weight change:  Gen: NAD CVS: RRR Resp:CTA Abd: +BS, soft, NT/nD Ext: no edema  Recent Labs  Lab 03/05/24 1820 03/05/24 1911 03/05/24 2043 03/05/24 2232 03/06/24 1723 03/06/24 2212 03/07/24 0221 03/07/24 0404 03/08/24 0448 03/08/24 0737 03/08/24 1328 03/08/24 1929 03/09/24 0444 03/09/24 1731 03/10/24 0435  NA 117*  --  119*   < > 121*   < > 118*   < > 130* 129* 131* 132* 132* 135 134*  K 4.2  --  4.0  --  4.0  --   --   --   --   --   --   --  4.1  --  4.2  CL 83*  --  87*  --  88*  --   --   --   --   --   --   --  102  --  100  CO2 20*  --  20*  --  23  --   --   --   --   --   --   --  20*  --  23  GLUCOSE 158*  --  104*  --  121*  --   --   --   --   --   --   --  204*  --  232*  BUN 7*  --  7*  --  <5*  --   --   --   --   --   --   --  9  --  10  CREATININE 0.88  --  0.83  --  0.78  --  0.84  --   --   --   --   --  0.93  --  1.04  ALBUMIN 4.8  --   --   --   --   --   --   --   --   --   --   --  3.8  --  3.6  CALCIUM  9.5  --  9.2  --  9.5  --   --   --   --   --   --   --  9.7  --  9.9  PHOS  --  2.5  --   --   --   --   --   --   --   --   --   --  3.9  --  3.9  AST 20  --   --   --   --   --   --   --   --   --   --   --   --   --   --   ALT 15  --   --   --   --   --   --   --   --   --   --   --   --   --   --    < > =  values in this interval not displayed.   Liver Function Tests: Recent Labs  Lab 03/05/24 1820 03/09/24 0444 03/10/24 0435  AST 20  --   --   ALT 15  --   --   ALKPHOS 66  --   --   BILITOT  0.5  --   --   PROT 7.8  --   --   ALBUMIN 4.8 3.8 3.6   No results for input(s): LIPASE, AMYLASE in the last 168 hours. No results for input(s): AMMONIA in the last 168 hours. CBC: Recent Labs  Lab 03/05/24 1820 03/07/24 0221 03/07/24 0404 03/09/24 0444  WBC 5.1 5.5 5.7 5.8  HGB 13.2 11.5* 11.2* 12.2*  HCT 36.8* 33.0* 32.2* 35.9*  MCV 80.2 83.3 83.2 85.9  PLT 286 246 262 252   Cardiac Enzymes: No results for input(s): CKTOTAL, CKMB, CKMBINDEX, TROPONINI in the last 168 hours. CBG: Recent Labs  Lab 03/09/24 0843 03/09/24 1203 03/09/24 1606 03/09/24 2123 03/10/24 0607  GLUCAP 269* 152* 212* 281* 245*    Iron Studies: No results for input(s): IRON, TIBC, TRANSFERRIN, FERRITIN in the last 72 hours. Studies/Results: No results found.  aspirin  EC  81 mg Oral Daily   atorvastatin   10 mg Oral Daily   Chlorhexidine  Gluconate Cloth  6 each Topical Daily   gabapentin   400 mg Oral TID   heparin   5,000 Units Subcutaneous Q8H   insulin  aspart  0-15 Units Subcutaneous TID WC   melatonin  3 mg Oral QHS   montelukast   10 mg Oral QHS   pantoprazole   40 mg Oral Daily   sodium chloride   1 g Oral BID WC   tamsulosin   0.4 mg Oral QHS    BMET    Component Value Date/Time   NA 134 (L) 03/10/2024 0435   K 4.2 03/10/2024 0435   CL 100 03/10/2024 0435   CO2 23 03/10/2024 0435   GLUCOSE 232 (H) 03/10/2024 0435   BUN 10 03/10/2024 0435   CREATININE 1.04 03/10/2024 0435   CREATININE 0.94 02/25/2024 1111   CALCIUM  9.9 03/10/2024 0435   GFRNONAA >60 03/10/2024 0435   GFRNONAA 68 01/21/2021 0923   GFRAA 78 01/21/2021 0923   CBC    Component Value Date/Time   WBC 5.8 03/09/2024 0444   RBC 4.18 (L) 03/09/2024 0444   HGB 12.2 (L) 03/09/2024 0444   HCT 35.9 (L) 03/09/2024 0444   PLT 252 03/09/2024 0444   MCV 85.9 03/09/2024 0444   MCH 29.2 03/09/2024 0444   MCHC 34.0 03/09/2024 0444   RDW 13.1 03/09/2024 0444   LYMPHSABS 1,734 05/06/2023 1501   MONOABS  0.7 01/11/2021 1433   EOSABS 160 02/25/2024 1111   BASOSABS 50 02/25/2024 1111      Assessment/Plan:  Acute on chronic hyponatremia - he has been hospitalized 3 times in the past 4 months with similar presentation, although he had N/V/D on the previous 2.  Sosm low at 249, Uosm high at 504 and UNa high at 91, however these were drawn 2 hours after receiving 500 mL of IV NS and 100 mL of 3% saline.  His repeat Na dropped to 117 and he was transferred to the ICU and started on 3% saline at 45 mL/hr with improvement of symptoms and Na to 132 on 03/07/24 and 3% and NaCl tabs 1 gm bid were stopped.  Cortisol was 14.5 so WNL.  TSH was WNL.  No SSRI but is on Wellbutrin  (although rarely causes hyponatremia) which was  stopped by PCCM.  Concerning that this is a recurring issue.  He is currently asymptomatic.  Na level stable at 134 off of 3% and NaCl.   Continue with NaCl tabs 1 gm bid.  Nothing further to add. Will sign off.  Please call with any questions or concerns. Hypoglycemia - hold metformin  for now and check cortisol as above. HTN - Bp was low yesterday but responded to IVF. DM type 2 - per primary.  Hold metformin  as above.   Fairy RONAL Sellar, MD [facility]

## 2024-03-13 ENCOUNTER — Telehealth: Payer: Self-pay | Admitting: *Deleted

## 2024-03-13 NOTE — Transitions of Care (Post Inpatient/ED Visit) (Signed)
 03/13/2024  Name: Michael Guzman MRN: [id number] DOB: Mar 22, 1946  Today's TOC FU Call Status: Today's TOC FU Call Status:: Successful TOC FU Call Completed TOC FU Call Complete Date: 03/13/24 Patient's Name and Date of Birth confirmed.  Transition Care Management Follow-up Telephone Call Date of Discharge: 03/10/24 Discharge Facility: [facility]) Type of Discharge: Inpatient Admission Primary Inpatient Discharge Diagnosis:: Hyponatremia How have you been since you were released from the hospital?: Better (appetite good, no issues with bowel or bladder, ambulating without difficulty) Any questions or concerns?: No  Items Reviewed: Did you receive and understand the discharge instructions provided?: Yes Medications obtained,verified, and reconciled?: Yes (Medications Reviewed) Any new allergies since your discharge?: No Dietary orders reviewed?: No Do you have support at home?: Yes People in Home [RPT]: child(ren), adult Name of Support/Comfort Primary Source: adult son  Medications Reviewed Today: Medications Reviewed Today     Reviewed by Michael Mliss LABOR, RN (Registered Nurse) on 03/13/24 at [phone]  Med List Status: <None>   Medication Order Taking? Sig Documenting Provider Last Dose Status Informant  Ascorbic Acid (VITAMIN C) 1000 MG tablet [id number] Yes Take 1,000 mg by mouth in the morning. [provider]  Active Self, Pharmacy Records  aspirin  EC 81 MG tablet [id number] Yes Take 1 tablet (81 mg total) by mouth daily. Michael Oneil BROCKS, MD  Active Self, Pharmacy Records           Med Note [name] Sep 17, 2022  2:44 PM)    atorvastatin  (LIPITOR ) 10 MG tablet [id number] Yes TAKE 1 TABLET BY MOUTH DAILY Guzman, Michael E, [name]  Active Self, Pharmacy Records  B Complex-C (SUPER B COMPLEX PO) [id number] Yes Take 1 tablet by mouth in the morning. [provider]  Active Self, Pharmacy Records  Blood Glucose Monitoring Suppl (ONE TOUCH ULTRA 2)  w/Device KIT [id number] Yes Use as directed Guzman, Michael E, [name]  Active Self, Pharmacy Records  Cholecalciferol  (VITAMIN D3) 250 MCG (10000 UT) capsule [id number] Yes Take 10,000 Units by mouth in the morning. [provider]  Active Self, Pharmacy Records  gabapentin  (NEURONTIN ) 800 MG tablet [id number] Yes TAKE 1 TABLET BY MOUTH 3 TO 4 TIMES DAILY FOR PAIN, HOT FLASHES AND SWEATS Michael Jeoffrey RAMAN, [name]  Active Self, Pharmacy Records  glucose blood [facility] ULTRA) test strip [id number] Yes CHECK BLOOD SUGAR 3 TIMES  DAILY Michael Jeoffrey RAMAN, [name]  Active Self, Pharmacy Records  glycopyrrolate  (ROBINUL ) 2 MG tablet [id number] Yes Take  1 tablet  3 x / day for Excessive Sweating  TAKE ONE TABLET BY                                                 MOUTH Michael Jeoffrey RAMAN, [name]  Active Self, Pharmacy Records  insulin  NPH-regular Human (HUMULIN  70/30) (70-30) 100 UNIT/ML injection [id number] Yes INJECT SUBCUTANEOUSLY 50  UNITS TWICE DAILY Michael Jeoffrey RAMAN, [name]  Active Self, Pharmacy Records  Insulin  Syringes, Disposable, U-100 1 ML MISC [id number] Yes 50 Units by Does not apply route 2 (two) times daily. inject 50 units into skin 2 x /day Michael Fallow, MD  Active Self, Pharmacy Records  isosorbide  mononitrate (IMDUR ) 30 MG 24 hr tablet [id number] Yes Take 1 tablet (30 mg total) by mouth daily. Michael Jeoffrey RAMAN, [name]  Active Self, Pharmacy Records  Lancets (  [facility] DELICA PLUS [place]) MISC [id number] Yes CHECK BLOOD SUGAR 3 TIMES  DAILY Michael Fallow, MD  Active Self, Pharmacy Records  latanoprost  (XALATAN ) 0.005 % ophthalmic solution [id number] Yes Place 1 drop into both eyes at bedtime. [provider]  Active Self, Pharmacy Records  Magnesium  500 MG TABS [id number]  Take 500 mg by mouth in the morning. [provider]  Active Self, Pharmacy Records  metFORMIN  (GLUCOPHAGE -XR) 500 MG 24 hr tablet [id number] Yes TAKE 2 TABLETS BY MOUTH TWICE  DAILY WITH MEALS FOR DIABETES Guzman, Michael E, [name]   Active Self, Pharmacy Records  metoprolol  succinate (TOPROL -XL) 25 MG 24 hr tablet [id number] Yes TAKE ONE-HALF TABLET BY MOUTH  DAILY Michael Oneil BROCKS, MD  Active Self, Pharmacy Records  montelukast  (SINGULAIR ) 10 MG tablet [id number] Yes TAKE 1 TABLET BY MOUTH  DAILY FOR ALLERGIES Michael Jeoffrey RAMAN, [name]  Active Self, Pharmacy Records  nitroGLYCERIN  (NITROSTAT ) 0.4 MG SL tablet [id number] Yes DISSOLVE 1 TABLET UNDER THE  TONGUE EVERY 5 MINUTES AS NEEDED FOR CHEST PAIN. MAX OF 3 TABLETS IN 15 MINUTES. CALL 911 IF PAIN  PERSISTS. Michael Jeoffrey RAMAN, [name]  Active Self, Pharmacy Records  pantoprazole  (PROTONIX ) 40 MG tablet [id number] Yes TAKE 1 TABLET BY MOUTH  DAILY TO PREVENT HEARTBURN  AND INDIGESTION Michael Jeoffrey RAMAN, [name]  Active Self, Pharmacy Records  sodium chloride  1 g tablet [id number] Yes Take 1 tablet (1 g total) by mouth 2 (two) times daily with a meal. Michael Carliss ORN, DO  Active   tamsulosin  (FLOMAX ) 0.4 MG CAPS capsule [id number] Yes Take 0.4 mg by mouth at bedtime. [provider]  Active Self, Pharmacy Records  Vitamin E 450 MG (1000 UT) CAPS [id number] Yes Take 450 mg by mouth in the morning. [provider]  Active Self, Pharmacy Records  zinc gluconate 50 MG tablet [id number] Yes Take 50 mg by mouth in the morning. [provider]  Active Self, Pharmacy Records            Home Care and Equipment/Supplies: Were Home Health Services Ordered?: No Any new equipment or medical supplies ordered?: No  Functional Questionnaire: Do you need assistance with bathing/showering or dressing?: No Do you need assistance with meal preparation?: No Do you need assistance with eating?: No Do you have difficulty maintaining continence: No Do you need assistance with getting out of bed/getting out of a chair/moving?: Yes (cane when leaves home) Do you have difficulty managing or taking your medications?: No  Follow up appointments reviewed: PCP Follow-up appointment confirmed?:  Yes Date of PCP follow-up appointment?: 03/15/24 Follow-up Provider: Jeoffrey Kayla NP  @ 1015 am [facility] Follow-up appointment confirmed?: NA Do you need transportation to your follow-up appointment?: No Do you understand care options if your condition(s) worsen?: Yes-patient verbalized understanding  SDOH Interventions Today    Flowsheet Row Most Recent Value  SDOH Interventions   Food Insecurity Interventions Intervention Not Indicated  Housing Interventions Intervention Not Indicated  Transportation Interventions Intervention Not Indicated  Utilities Interventions Intervention Not Indicated    Goals Addressed             This Visit's Progress    VBCI Transitions of Care (TOC) Care Plan       Problems:  Recent Hospitalization for treatment of Hyponatremia No Hospital Follow Up Provider appointment - RN Care Manager collaborated with care guide, scheduled post hospital follow up for 03/21/24 @ 1015 am Patient states [facility] sent him insulin  pens and pt told them  not to send him any needles for the pens because he had vials of insulin  left and was using the vials, pt has one vial left and has insulin  pens on hands but no needles, pt states he is calling [facility] today to have them send the needles for the pens, pt states if he has any issues with this process he will contact RN CM for assistance.  Goal:  Over the next 30 days, the patient will not experience hospital readmission  Interventions:  Evaluation of current treatment plan related to Hyponatremia, Limited education about diabetes, carbohydrate modified diet* self-management and patient's adherence to plan as established by provider. Discussed plans with patient for ongoing care management follow up and provided patient with direct contact information for care management team Evaluation of current treatment plan related to Hyponatremia, diabetes and patient's adherence to plan as established by  provider Collaborated with care guide regarding scheduling post hospital follow up appointment Screening for signs and symptoms of depression related to chronic disease state  Assessed social determinant of health barriers Reviewed in depth carbohydrate modified diet, plate method Reviewed safety precautions, importance of using cane as needed Reviewed CBG log Reviewed signs/ symptoms hyponatremia, being mindful if sweats a lot on hot days to take Rubinol as prescribed Routed today's note to primary care provider  Patient Self Care Activities:  Attend all scheduled provider appointments Attend church or other social activities Call pharmacy for medication refills 3-7 days in advance of running out of medications Call provider office for new concerns or questions  Notify RN Care Manager of TOC call rescheduling needs Participate in Transition of Care Program/Attend TOC scheduled calls Take medications as prescribed   check blood sugar at prescribed times: per doctor's order/ recommendation enter blood sugar readings and medication or insulin  into daily log take the blood sugar log to all doctor visits fill half of plate with vegetables manage portion size set a realistic goal  Plan:  Telephone follow up appointment with care management team member scheduled for:  03/21/24 @ 1015 am        Mliss Creed [facility], BSN RN Care Manager/ Transition of Care [facility] [phone]

## 2024-03-15 ENCOUNTER — Encounter: Payer: Self-pay | Admitting: Family Medicine

## 2024-03-15 ENCOUNTER — Telehealth: Payer: Self-pay | Admitting: Pharmacist

## 2024-03-15 ENCOUNTER — Ambulatory Visit: Admitting: Family Medicine

## 2024-03-15 VITALS — BP 120/72 | HR 60 | Ht 70.0 in | Wt 185.2 lb

## 2024-03-15 DIAGNOSIS — F339 Major depressive disorder, recurrent, unspecified: Secondary | ICD-10-CM | POA: Diagnosis not present

## 2024-03-15 DIAGNOSIS — E1159 Type 2 diabetes mellitus with other circulatory complications: Secondary | ICD-10-CM

## 2024-03-15 DIAGNOSIS — E871 Hypo-osmolality and hyponatremia: Secondary | ICD-10-CM | POA: Diagnosis not present

## 2024-03-15 LAB — COMPREHENSIVE METABOLIC PANEL WITH GFR
AG Ratio: 1.7 (calc) (ref 1.0–2.5)
ALT: 20 U/L (ref 9–46)
AST: 20 U/L (ref 10–35)
Albumin: 4.5 g/dL (ref 3.6–5.1)
Alkaline phosphatase (APISO): 69 U/L (ref 35–144)
BUN: 11 mg/dL (ref 7–25)
CO2: 28 mmol/L (ref 20–32)
Calcium: 9.9 mg/dL (ref 8.6–10.3)
Chloride: 98 mmol/L (ref 98–110)
Creat: 0.96 mg/dL (ref 0.70–1.28)
Globulin: 2.6 g/dL (ref 1.9–3.7)
Glucose, Bld: 204 mg/dL — ABNORMAL HIGH (ref 65–99)
Potassium: 4.7 mmol/L (ref 3.5–5.3)
Sodium: 136 mmol/L (ref 135–146)
Total Bilirubin: 0.4 mg/dL (ref 0.2–1.2)
Total Protein: 7.1 g/dL (ref 6.1–8.1)
eGFR: 82 mL/min/{1.73_m2} (ref >=60)

## 2024-03-15 MED ORDER — INSULIN PEN NEEDLE 30G X 8 MM MISC: 1.0000 | 0 refills | Status: DC | PRN

## 2024-03-15 NOTE — Assessment & Plan Note (Signed)
 Well controlled without Wellbutrin , he noticed no difference. PHQ 2. GAD 0. Follow up PRN

## 2024-03-15 NOTE — Progress Notes (Signed)
 Subjective:  HPI: Michael Guzman is a 78 y.o. male presenting on 03/15/2024 for Hospitalization Follow-up [facility] 6/22-6/27/25, Hyponatremia)   HPI Patient is in today for hospital follow up. He was hospitalized from 6/22-6/27 for hyponatremia. Admitted from home with fatigue and history of hyponatremia. Wellbutrin  was DC and pt was transitioned to salt tabs from hypertonic saline. Followed by nephrology. Outpatient PT recommended. Since DC has been feeling better. Has not started rehab, this will be starting July 18 at [facility], is ambulating with cane. Denies nausea, vomiting, diarrhea, constipation, chest pain, palpitations, SOB. Has noticed no difference off of Wellbutrin . He denies symptoms of depression or anxiety. Gets joy from spending time with his dog and gardening.  BG readings have been elevated, 186 FBG this AM. He usually takes 25-30 units or 40-50 units Humulin  BID but has been missing doses, he is unclear on exact dosing. Denies polyuria, polydipsia, chest pain, vision disturbances, paresthesias.    DC Summary 03/10/2024 for reference only: Hospital Course:   78 y/o male with PMH for DM type 2, HTN, HLD, h/o prostate cancer, ASCAD s/p PTCA, OSA, fatty liver disease, GERD, and history of hyponatremia who presented to [facility] on 6/22 with generalized weakness x 4 days, serum sodium 117 at that time.    Assessment and Plan:   Acute on chronic hyponatremia - Initially requiring 3% saline in the ICU.  Serum sodium is improved with removal of hypertonic saline transition to p.o. salt tabs.  Followed closely by nephrology.  Serum sodium 134 this a.m.  Underlying etiology likely multifactorial given possible low solute intake combined with antidepressant use.  Will DC with prescription for salt tabs to take as directed.  Recommend follow-up with primary care provider in 1 to 2 weeks.   Grief reaction/depression - Wellbutrin  discontinued given above. Follow-up with PCP and  possibly behavioral health in the outpatient setting.   Hypertension/CAD/HLD - Antihypertensives initially held due to soft blood pressures likely from hypovolemia.  Recommend restarting home regimen upon discharge.   Diabetes mellitus - Resume home regimen   Physical debilitation muscle weakness - Evaluated by physical therapy.  Recommending outpatient PT.     Consultants: Nephrology Procedures performed: None Disposition: Home Diet recommendation:  Discharge Diet Orders (From admission, onward)      03/15/2024   10:54 AM 03/15/2024   10:17 AM 03/13/2024   10:14 AM 02/28/2024   10:29 AM 12/17/2023   10:43 AM  Depression screen PHQ 2/9  Decreased Interest 1 1 0 1 0  Down, Depressed, Hopeless 0 0 0 0 0  PHQ - 2 Score 1 1 0 1 0  Altered sleeping 0   3   Tired, decreased energy 1   2   Change in appetite 0   1   Feeling bad or failure about yourself  0   0   Trouble concentrating 0   0   Moving slowly or fidgety/restless    0   Suicidal thoughts 0   0   PHQ-9 Score 2   7   Difficult doing work/chores Not difficult at all   Not difficult at all      Review of Systems  All other systems reviewed and are negative.   Relevant past medical history reviewed and updated as indicated.   Past Medical History:  Diagnosis Date   Arthritis    CAD (coronary artery disease)    a.s/p DES to mid LAD and OM2 06/2012.   Chronic back pain  Chronically dry eyes    Diabetes mellitus without complication (HCC)    TYPE 2    DKA (diabetic ketoacidoses) 01/30/2017   Elevated PSA    being monitored   Fatty liver    GERD (gastroesophageal reflux disease)    Hx of radiation therapy    prostate , [facility]    Hypertension    Hypertriglyceridemia    Intermediate coronary syndrome (HCC) 07/26/2012   Nausea vomiting and diarrhea 11/11/2023   Neuromuscular disorder (HCC)    neuropathy feet and legs   Prostate cancer (HCC)    RADTIATION      Past Surgical History:   Procedure Laterality Date   APPENDECTOMY     CARDIAC SURGERY  ~2017   3 stent placed.   COLONOSCOPY  02/12/2010   Buccini   COLONOSCOPY WITH PROPOFOL  N/A 09/21/2022   Procedure: COLONOSCOPY WITH PROPOFOL ;  Surgeon: Wilhelmenia Aloha Raddle., MD;  Location: [facility];  Service: Gastroenterology;  Laterality: N/A;   ENDOSCOPIC MUCOSAL RESECTION N/A 09/21/2022   Procedure: ENDOSCOPIC MUCOSAL RESECTION;  Surgeon: Wilhelmenia Aloha Raddle., MD;  Location: [facility];  Service: Gastroenterology;  Laterality: N/A;   HEMOSTASIS CLIP PLACEMENT  09/21/2022   Procedure: HEMOSTASIS CLIP PLACEMENT;  Surgeon: Wilhelmenia Aloha Raddle., MD;  Location: [facility];  Service: Gastroenterology;;   LEFT HEART CATHETERIZATION WITH CORONARY ANGIOGRAM N/A 06/27/2012   Procedure: LEFT HEART CATHETERIZATION WITH CORONARY ANGIOGRAM;  Surgeon: Oneil Parchment, MD;  Location: [facility] CATH LAB;  Service: Cardiovascular;  Laterality: N/A;   LEFT HEART CATHETERIZATION WITH CORONARY ANGIOGRAM N/A 07/25/2012   Procedure: LEFT HEART CATHETERIZATION WITH CORONARY ANGIOGRAM;  Surgeon: Wilbert JONELLE Bihari, MD;  Location: [facility] CATH LAB;  Service: Cardiovascular;  Laterality: N/A;   PROSTATE BIOPSY     RADIOLOGY WITH ANESTHESIA N/A 12/03/2015   Procedure: MRI LUMBAR SPINE;  Surgeon: Medication Radiologist, MD;  Location: [facility] OR;  Service: Radiology;  Laterality: N/A;   SUBMUCOSAL LIFTING INJECTION  09/21/2022   Procedure: SUBMUCOSAL LIFTING INJECTION;  Surgeon: Wilhelmenia Aloha Raddle., MD;  Location: [facility];  Service: Gastroenterology;;   TOTAL HIP ARTHROPLASTY Right 04/28/2019   Procedure: TOTAL HIP ARTHROPLASTY ANTERIOR APPROACH;  Surgeon: Yvone Rush, MD;  Location: [facility];  Service: Orthopedics;  Laterality: Right;    Allergies and medications reviewed and updated.   Current Outpatient Medications:    Ascorbic Acid (VITAMIN C) 1000 MG tablet, Take 1,000 mg by mouth in the morning., Disp: , Rfl:    aspirin  EC 81 MG tablet, Take 1 tablet  (81 mg total) by mouth daily., Disp: 90 tablet, Rfl: 3   atorvastatin  (LIPITOR ) 10 MG tablet, TAKE 1 TABLET BY MOUTH DAILY, Disp: 100 tablet, Rfl: 2   B Complex-C (SUPER B COMPLEX PO), Take 1 tablet by mouth in the morning., Disp: , Rfl:    Blood Glucose Monitoring Suppl (ONE TOUCH ULTRA 2) w/Device KIT, Use as directed, Disp: 1 kit, Rfl: 0   Cholecalciferol  (VITAMIN D3) 250 MCG (10000 UT) capsule, Take 10,000 Units by mouth in the morning., Disp: , Rfl:    gabapentin  (NEURONTIN ) 800 MG tablet, TAKE 1 TABLET BY MOUTH 3 TO 4 TIMES DAILY FOR PAIN, HOT FLASHES AND SWEATS, Disp: 400 tablet, Rfl: 2   glucose blood (ONETOUCH ULTRA) test strip, CHECK BLOOD SUGAR 3 TIMES  DAILY, Disp: 300 strip, Rfl: 3   glycopyrrolate  (ROBINUL ) 2 MG tablet, Take  1 tablet  3 x / day for Excessive Sweating  TAKE ONE TABLET BY  MOUTH, Disp: 270 tablet, Rfl: 3   insulin  NPH-regular Human (HUMULIN  70/30) (70-30) 100 UNIT/ML injection, INJECT SUBCUTANEOUSLY 50  UNITS TWICE DAILY, Disp: 100 mL, Rfl: 2   Insulin  Pen Needle (NOVOFINE) 30G X 8 MM MISC, Inject 10 each into the skin as needed., Disp: 100 each, Rfl: 0   Insulin  Syringes, Disposable, U-100 1 ML MISC, 50 Units by Does not apply route 2 (two) times daily. inject 50 units into skin 2 x /day, Disp: 200 each, Rfl: 3   isosorbide  mononitrate (IMDUR ) 30 MG 24 hr tablet, Take 1 tablet (30 mg total) by mouth daily., Disp: 90 tablet, Rfl: 0   Lancets (ONETOUCH DELICA PLUS LANCET33G) MISC, CHECK BLOOD SUGAR 3 TIMES  DAILY, Disp: 300 each, Rfl: 3   latanoprost  (XALATAN ) 0.005 % ophthalmic solution, Place 1 drop into both eyes at bedtime., Disp: , Rfl:    Magnesium  500 MG TABS, Take 500 mg by mouth in the morning., Disp: , Rfl:    metFORMIN  (GLUCOPHAGE -XR) 500 MG 24 hr tablet, TAKE 2 TABLETS BY MOUTH TWICE  DAILY WITH MEALS FOR DIABETES, Disp: 400 tablet, Rfl: 2   metoprolol  succinate (TOPROL -XL) 25 MG 24 hr tablet, TAKE ONE-HALF TABLET  BY MOUTH  DAILY, Disp: 45 tablet, Rfl: 3   montelukast  (SINGULAIR ) 10 MG tablet, TAKE 1 TABLET BY MOUTH  DAILY FOR ALLERGIES, Disp: 100 tablet, Rfl: 0   nitroGLYCERIN  (NITROSTAT ) 0.4 MG SL tablet, DISSOLVE 1 TABLET UNDER THE  TONGUE EVERY 5 MINUTES AS NEEDED FOR CHEST PAIN. MAX OF 3 TABLETS IN 15 MINUTES. CALL 911 IF PAIN  PERSISTS., Disp: 125 tablet, Rfl: 2   pantoprazole  (PROTONIX ) 40 MG tablet, TAKE 1 TABLET BY MOUTH  DAILY TO PREVENT HEARTBURN  AND INDIGESTION, Disp: 100 tablet, Rfl: 0   sodium chloride  1 g tablet, Take 1 tablet (1 g total) by mouth 2 (two) times daily with a meal., Disp: 60 tablet, Rfl: 0   tamsulosin  (FLOMAX ) 0.4 MG CAPS capsule, Take 0.4 mg by mouth at bedtime., Disp: , Rfl:    Vitamin E 450 MG (1000 UT) CAPS, Take 450 mg by mouth in the morning., Disp: , Rfl:    zinc gluconate 50 MG tablet, Take 50 mg by mouth in the morning., Disp: , Rfl:   No Known Allergies  Objective:   BP 120/72   Pulse 60   Ht 5' 10 (1.778 m)   Wt 185 lb 3.2 oz (84 kg)   SpO2 99%   BMI 26.57 kg/m      03/15/2024   10:13 AM 03/10/2024    7:19 AM 03/10/2024    3:41 AM  Vitals with BMI  Height 5' 10    Weight 185 lbs 3 oz    BMI 26.57    Systolic 120 130 876  Diastolic 72 74 72  Pulse 60 62 71     Physical Exam Vitals and nursing note reviewed.  Constitutional:      Appearance: Normal appearance. He is normal weight.  HENT:     Head: Normocephalic and atraumatic.  Cardiovascular:     Rate and Rhythm: Normal rate and regular rhythm.     Pulses: Normal pulses.     Heart sounds: Normal heart sounds.  Pulmonary:     Effort: Pulmonary effort is normal.     Breath sounds: Normal breath sounds.  Skin:    General: Skin is warm and dry.     Capillary Refill: Capillary refill takes less than 2 seconds.  Neurological:  General: No focal deficit present.     Mental Status: He is alert and oriented to person, place, and time. Mental status is at baseline.  Psychiatric:         Mood and Affect: Mood normal.        Behavior: Behavior normal.        Thought Content: Thought content normal.        Judgment: Judgment normal.     Assessment & Plan:  Hyponatremia Assessment & Plan: Repeat CMP today. Asymptomatic. Continue salt tablets and regular diet. Working on BG control. Limit free water . Proceed with rehab as ordered. Discussed ED return precautions.   Orders: -     Comprehensive metabolic panel with GFR -     Amb Referral to Clinical Pharmacist  Type 2 diabetes mellitus with vascular disease (HCC) Assessment & Plan: Last A1c 7.1%. Insulin  administration is inconsistent and he is understandably fearful of hypoglycemia. He reports no hypoglycemia events if he takes 35 units Humulin  70/30 BID so we will start with this decrease from his 50 units BID prescribed. I applied a CGM in office today and educated him on use of this as well as set up in office. Discussed verifying abnormal readings with glucose monitor prior to treating. Will start with 35 units BID insulin  today and increase by 1 units daily until FBG readings are <130. Referral to pharmacy for assistance managing and will consider switching to Tresiba or alternative with less hypoglycemia potential. Follow up in 1-2 weeks.   Orders: -     Amb Referral to Clinical Pharmacist  Depression, recurrent (HCC) Assessment & Plan: Well controlled without Wellbutrin , he noticed no difference. PHQ 2. GAD 0. Follow up PRN   Other orders -     Insulin  Pen Needle; Inject 10 each into the skin as needed.  Dispense: 100 each; Refill: 0     Follow up plan: Return in about 2 weeks (around 03/29/2024) for diabetes.  Jeoffrey GORMAN Barrio, [name]

## 2024-03-15 NOTE — Telephone Encounter (Addendum)
 Type 2 Diabetes  Patient on BID 70/30 insulin  Discussed case with PCP--Will place referral to follow up with patient Would like to attempt basal/optimized regimen Only takes 70/30 if FBG or BG is 130-150 due to feat of hypoglycemia (on 50 units twice daily) Would benefit from CGM If BID 70/30 is needed, could adjust dose, but we need to know blood sugars A1c 7.1% on 02/25/24  Per PCP note today--> BG readings have been elevated, 186 FBG this AM. He usually takes 25-30 units or 40-50 units Humulin  BID but has been missing doses, he is unclear on exact dosing.    [id number] placed to pharmacy  Mliss Tarry Griffin, PharmD, BCACP, CPP Clinical Pharmacist, [facility]

## 2024-03-15 NOTE — Assessment & Plan Note (Signed)
 Repeat CMP today. Asymptomatic. Continue salt tablets and regular diet. Working on BG control. Limit free water . Proceed with rehab as ordered. Discussed ED return precautions.

## 2024-03-15 NOTE — Assessment & Plan Note (Addendum)
 Last A1c 7.1%. Insulin  administration is inconsistent and he is understandably fearful of hypoglycemia. He reports no hypoglycemia events if he takes 35 units Humulin  70/30 BID so we will start with this decrease from his 50 units BID prescribed. I applied a CGM in office today and educated him on use of this as well as set up in office. Discussed verifying abnormal readings with glucose monitor prior to treating. Will start with 35 units BID insulin  today and increase by 1 units daily until FBG readings are <130. Referral to pharmacy for assistance managing and will consider switching to Tresiba or alternative with less hypoglycemia potential. Follow up in 1-2 weeks.

## 2024-03-16 ENCOUNTER — Ambulatory Visit: Payer: Self-pay | Admitting: Family Medicine

## 2024-03-20 ENCOUNTER — Other Ambulatory Visit: Payer: Self-pay | Admitting: Family Medicine

## 2024-03-20 NOTE — Telephone Encounter (Unsigned)
 Copied from CRM [id number]. Topic: Clinical - Medication Refill >> Mar 20, 2024  2:37 PM Silvana J wrote: Medication: Insulin  Pen Needle (NOVOFINE) 30G X 8 MM MISC  Has the patient contacted their pharmacy? Yes (Agent: If no, request that the patient contact the pharmacy for the refill. If patient does not wish to contact the pharmacy document the reason why and proceed with request.) (Agent: If yes, when and what did the pharmacy advise?)  This is the patient's preferred pharmacy:  [facility] [place], [place] - [address] [place] [address] Phone: [phone] Fax: [phone]  Is this the correct pharmacy for this prescription? Yes If no, delete pharmacy and type the correct one.   Has the prescription been filled recently? No  Is the patient out of the medication? Yes  Has the patient been seen for an appointment in the last year OR does the patient have an upcoming appointment? Yes  Can we respond through MyChart? Yes  Agent: Please be advised that Rx refills may take up to 3 business days. We ask that you follow-up with your pharmacy.

## 2024-03-21 ENCOUNTER — Telehealth

## 2024-03-21 ENCOUNTER — Other Ambulatory Visit: Payer: Self-pay | Admitting: *Deleted

## 2024-03-21 NOTE — Patient Instructions (Signed)
 Visit Information  Thank you for taking time to visit with me today. Please don't hesitate to contact me if I can be of assistance to you before our next scheduled telephone appointment.  Our next appointment is by telephone on 03/29/24 at 1015 am  Following is a copy of your care plan:   Goals Addressed             This Visit's Progress    VBCI Transitions of Care (TOC) Care Plan       Problems:  Recent Hospitalization for treatment of Hyponatremia No Hospital Follow Up Provider appointment - RN Care Manager collaborated with care guide, scheduled post hospital follow up for 03/21/24 @ 1015 am Patient states [facility] sent him insulin  pens and pt told them not to send him any needles for the pens because he had vials of insulin  left and was using the vials, pt has one vial left and has insulin  pens on hands but no needles, pt states he is calling [facility] today to have them send the needles for the pens, pt states if he has any issues with this process he will contact RN CM for assistance. 03/21/24-pt reports he saw primary care provider and now has CGM for monitoring blood sugar, pt states  I don't know the name of this, I'm going back for them to change everything and make sure it's working okay,  FBS this morning 74, pt states he still has Humulin  70/30 in vials, states [facility] never sent needles for pens because they had sent previously, pt does not know if he lost them but states he is tired of dealing with [facility] and will not be talking to them again,  RN Care Manager suggested referral to pharmacist for management/ reinforcement/ teaching of new CGM and making sure pt has insulin , needles for pens on hand, etc.  Pt adamantly refuses referral for pharmacist, states he does not want anything else added to make more confusion,  pt feels he is doing well managing CGM and has Humulin  70/30 insulin  vials on hand. Goal:  Over the next 30 days, the patient will not experience hospital  readmission  Interventions:  Evaluation of current treatment plan related to Hyponatremia, Limited education about diabetes, carbohydrate modified diet* self-management and patient's adherence to plan as established by provider. Discussed plans with patient for ongoing care management follow up and provided patient with direct contact information for care management team Evaluation of current treatment plan related to Hyponatremia, diabetes and patient's adherence to plan as established by provider Reinforced in depth carbohydrate modified diet, plate method Reinforced safety precautions, importance of using cane as needed Reviewed signs/ symptoms hyponatremia, being mindful if sweats a lot on hot days to take Rubinol as prescribed Patient has CGM set up at primary care provider office, RN CM feels pt can benefit from oversight from pharmacist, pt declines   Patient Self Care Activities:  Attend all scheduled provider appointments Attend church or other social activities Call pharmacy for medication refills 3-7 days in advance of running out of medications Call provider office for new concerns or questions  Notify RN Care Manager of TOC call rescheduling needs Participate in Transition of Care Program/Attend TOC scheduled calls Take medications as prescribed   check blood sugar at prescribed times: per doctor's order/ recommendation enter blood sugar readings and medication or insulin  into daily log take the blood sugar log to all doctor visits fill half of plate with vegetables manage portion size set a realistic  goal  Plan:  Telephone follow up appointment with care management team member scheduled for:  03/29/24 @ 1015 am        Patient verbalizes understanding of instructions and care plan provided today and agrees to view in MyChart. Active MyChart status and patient understanding of how to access instructions and care plan via MyChart confirmed with patient.     Telephone  follow up appointment with care management team member scheduled for: 03/29/24 @ 1015 am  Please call the care guide team at [phone] if you need to cancel or reschedule your appointment.   Please call the Suicide and Crisis Lifeline: 988 call the [place]  National Suicide Prevention Lifeline: 365-541-2738 or TTY: (313)364-6523 TTY 715-158-8340) to talk to a trained counselor call 1-800-273-TALK (toll free, 24 hour hotline) go to [facility] [address] [place] [phone]) call 911 if you are experiencing a Mental Health or Behavioral Health Crisis or need someone to talk to.  Mliss Creed [facility], BSN RN Care Manager/ Transition of Care [place]/ [facility] [phone]

## 2024-03-21 NOTE — Patient Outreach (Signed)
 Transition of Care week 2  Visit Note  03/21/2024  Name: Michael Guzman MRN: [id number]          DOB: 1946-07-01  Situation: Patient enrolled in [facility] 30-day program. Visit completed with patient by telephone.   Background:     Past Medical History:  Diagnosis Date   Arthritis    CAD (coronary artery disease)    a.s/p DES to mid LAD and OM2 06/2012.   Chronic back pain    Chronically dry eyes    Diabetes mellitus without complication (HCC)    TYPE 2    DKA (diabetic ketoacidoses) 01/30/2017   Elevated PSA    being monitored   Fatty liver    GERD (gastroesophageal reflux disease)    Hx of radiation therapy    prostate , [facility]    Hypertension    Hypertriglyceridemia    Intermediate coronary syndrome (HCC) 07/26/2012   Nausea vomiting and diarrhea 11/11/2023   Neuromuscular disorder (HCC)    neuropathy feet and legs   Prostate cancer (HCC)    RADTIATION     Assessment: Patient Reported Symptoms: Cognitive Cognitive Status: No symptoms reported, Able to follow simple commands, Alert and oriented to person, place, and time      Neurological Neurological Review of Symptoms: No symptoms reported    HEENT HEENT Symptoms Reported: No symptoms reported      Cardiovascular Cardiovascular Symptoms Reported: No symptoms reported    Respiratory Respiratory Symptoms Reported: No symptoms reported    Endocrine Endocrine Symptoms Reported: No symptoms reported Is patient diabetic?: Yes Is patient checking blood sugars at home?: Yes List most recent blood sugar readings, include date and time of day: FBS today 74,  pt now has CGM monitoring blood sugar, pt states this is new and will go back to primary care provider for follow up Endocrine Self-Management Outcome: 3 (uncertain)  Gastrointestinal Gastrointestinal Symptoms Reported: No symptoms reported      Genitourinary Genitourinary Symptoms Reported: No symptoms reported    Integumentary Integumentary  Symptoms Reported: No symptoms reported    Musculoskeletal Musculoskelatal Symptoms Reviewed: Limited mobility Additional Musculoskeletal Details: uses cane when leaves home Musculoskeletal Management Strategies: Adequate rest, Routine screening Musculoskeletal Self-Management Outcome: 4 (good)      Psychosocial Psychosocial Symptoms Reported: No symptoms reported         There were no vitals filed for this visit.  Medications Reviewed Today     Reviewed by Aura Mliss LABOR, RN (Registered Nurse) on 03/21/24 at 1046  Med List Status: <None>   Medication Order Taking? Sig Documenting Provider Last Dose Status Informant  Ascorbic Acid (VITAMIN C) 1000 MG tablet [id number]  Take 1,000 mg by mouth in the morning. [provider]  Active Self, Pharmacy Records  aspirin  EC 81 MG tablet [id number]  Take 1 tablet (81 mg total) by mouth daily. [name], MD  Active Self, Pharmacy Records           Med Note [name] Sep 17, 2022  2:44 PM)    atorvastatin  (LIPITOR ) 10 MG tablet [id number]  TAKE 1 TABLET BY MOUTH DAILY [name], [name], [name]  Active Self, Pharmacy Records  B Complex-C (SUPER B COMPLEX PO) [id number]  Take 1 tablet by mouth in the morning. [provider]  Active Self, Pharmacy Records  Blood Glucose Monitoring Suppl (ONE TOUCH ULTRA 2) w/Device KIT [id number]  Use as directed [name], [name], [name]  Active Self, Pharmacy Records  Cholecalciferol  (VITAMIN D3) 250 MCG (10000 UT) capsule [id number]  Take 10,000 Units by mouth in the morning. [provider]  Active Self, Pharmacy Records  gabapentin  (NEURONTIN ) 800 MG tablet [id number]  TAKE 1 TABLET BY MOUTH 3 TO 4 TIMES DAILY FOR PAIN, HOT FLASHES AND SWEATS [name], [name]  Active Self, Pharmacy Records  glucose blood [facility] ULTRA) test strip [id number]  CHECK BLOOD SUGAR 3 TIMES  DAILY [name], [name]  Active Self, Pharmacy Records  glycopyrrolate  (ROBINUL ) 2 MG tablet [id number]   Take  1 tablet  3 x / day for Excessive Sweating  TAKE ONE TABLET BY                                                 MOUTH [name], [name]  Active Self, Pharmacy Records  insulin  NPH-regular Human (HUMULIN  70/30) (70-30) 100 UNIT/ML injection [id number]  INJECT SUBCUTANEOUSLY 50  UNITS TWICE DAILY [name], [name]  Active Self, Pharmacy Records  Insulin  Pen Needle (NOVOFINE) 30G X 8 MM MISC [id number]  Inject 10 each into the skin as needed. [name], [name]  Active   Insulin  Syringes, Disposable, U-100 1 ML MISC [id number]  50 Units by Does not apply route 2 (two) times daily. inject 50 units into skin 2 x /day [name], MD  Active Self, Pharmacy Records  isosorbide  mononitrate (IMDUR ) 30 MG 24 hr tablet [id number]  Take 1 tablet (30 mg total) by mouth daily. [name], [name]  Active Self, Pharmacy Records  Lancets [facility] CATHRYNE PLUS [place]) MISC [id number]  CHECK BLOOD SUGAR 3 TIMES  DAILY [name], MD  Active Self, Pharmacy Records  latanoprost  (XALATAN ) 0.005 % ophthalmic solution [id number]  Place 1 drop into both eyes at bedtime. [provider]  Active Self, Pharmacy Records  Magnesium  500 MG TABS [id number]  Take 500 mg by mouth in the morning. [provider]  Active Self, Pharmacy Records  metFORMIN  (GLUCOPHAGE -XR) 500 MG 24 hr tablet [id number]  TAKE 2 TABLETS BY MOUTH TWICE  DAILY WITH MEALS FOR DIABETES [name], [name], [name]  Active Self, Pharmacy Records  metoprolol  succinate (TOPROL -XL) 25 MG 24 hr tablet [id number]  TAKE ONE-HALF TABLET BY MOUTH  DAILY [name], MD  Active Self, Pharmacy Records  montelukast  (SINGULAIR ) 10 MG tablet [id number]  TAKE 1 TABLET BY MOUTH  DAILY FOR ALLERGIES [name], [name]  Active Self, Pharmacy Records  nitroGLYCERIN  (NITROSTAT ) 0.4 MG SL tablet [id number]  DISSOLVE 1 TABLET UNDER THE  TONGUE EVERY 5 MINUTES AS NEEDED FOR CHEST PAIN. MAX OF 3 TABLETS IN 15 MINUTES. CALL 911 IF PAIN  PERSISTS. [name], [name]  Active Self, Pharmacy Records  pantoprazole  (PROTONIX ) 40 MG tablet [id number]  TAKE 1 TABLET BY MOUTH  DAILY TO PREVENT HEARTBURN  AND INDIGESTION [name], [name]  Active Self, Pharmacy Records  sodium chloride  1 g tablet [id number]  Take 1 tablet (1 g total) by mouth 2 (two) times daily with a meal. [name], DO  Active   tamsulosin  (FLOMAX ) 0.4 MG CAPS capsule [id number]  Take 0.4 mg by mouth at bedtime. [provider]  Active Self, Pharmacy Records  Vitamin E 450 MG (1000 UT) CAPS [id number]  Take 450 mg by mouth in the morning. [provider]  Active Self,  Pharmacy Records  zinc gluconate 50 MG tablet [id number]  Take 50 mg by mouth in the morning. [provider]  Active Self, Pharmacy Records            Goals Addressed             This Visit's Progress    VBCI Transitions of Care (TOC) Care Plan       Problems:  Recent Hospitalization for treatment of Hyponatremia No Hospital Follow Up Provider appointment - RN Care Manager collaborated with care guide, scheduled post hospital follow up for 03/21/24 @ 1015 am Patient states [facility] sent him insulin  pens and pt told them not to send him any needles for the pens because he had vials of insulin  left and was using the vials, pt has one vial left and has insulin  pens on hands but no needles, pt states he is calling [facility] today to have them send the needles for the pens, pt states if he has any issues with this process he will contact RN CM for assistance. 03/21/24-pt reports he saw primary care provider and now has CGM for monitoring blood sugar, pt states  I don't know the name of this, I'm going back for them to change everything and make sure it's working okay,  FBS this morning 74, pt states he still has Humulin  70/30 in vials, states [facility] never sent needles for pens because they had sent previously, pt does not know if he lost them but states he is tired of dealing with [facility] and  will not be talking to them again,  RN Care Manager suggested referral to pharmacist for management/ reinforcement/ teaching of new CGM and making sure pt has insulin , needles for pens on hand, etc.  Pt adamantly refuses referral for pharmacist, states he does not want anything else added to make more confusion,  pt feels he is doing well managing CGM and has Humulin  70/30 insulin  vials on hand. Goal:  Over the next 30 days, the patient will not experience hospital readmission  Interventions:  Evaluation of current treatment plan related to Hyponatremia, Limited education about diabetes, carbohydrate modified diet* self-management and patient's adherence to plan as established by provider. Discussed plans with patient for ongoing care management follow up and provided patient with direct contact information for care management team Evaluation of current treatment plan related to Hyponatremia, diabetes and patient's adherence to plan as established by provider Reinforced in depth carbohydrate modified diet, plate method Reinforced safety precautions, importance of using cane as needed Reviewed signs/ symptoms hyponatremia, being mindful if sweats a lot on hot days to take Rubinol as prescribed Patient has CGM set up at primary care provider office, RN CM feels pt can benefit from oversight from pharmacist, pt declines   Patient Self Care Activities:  Attend all scheduled provider appointments Attend church or other social activities Call pharmacy for medication refills 3-7 days in advance of running out of medications Call provider office for new concerns or questions  Notify RN Care Manager of TOC call rescheduling needs Participate in Transition of Care Program/Attend TOC scheduled calls Take medications as prescribed   check blood sugar at prescribed times: per doctor's order/ recommendation enter blood sugar readings and medication or insulin  into daily log take the blood sugar log to all  doctor visits fill half of plate with vegetables manage portion size set a realistic goal  Plan:  Telephone follow up appointment with care management team member scheduled for:  03/29/24 @  1015 am         Recommendation:   PCP Follow-up  Follow Up Plan:   Telephone follow-up 03/29/24 @ 1015 am  Mliss Creed [facility], BSN RN Care Manager/ Transition of Care [place]/ [facility] [phone]

## 2024-03-22 NOTE — Telephone Encounter (Signed)
 Too soon for refill.  Requested Prescriptions  Pending Prescriptions Disp Refills   Insulin  Pen Needle (NOVOFINE) 30G X 8 MM MISC 100 each 0    Sig: Inject 10 each into the skin as needed.     Endocrinology: Diabetes - Testing Supplies Passed - 03/22/2024  2:52 PM      Passed - Valid encounter within last 12 months    Recent Outpatient Visits           1 week ago Hyponatremia   [place] [facility] [name], [name]   3 weeks ago Encounter for [employer] annual wellness exam   [place] [facility] [name], [name]   4 months ago Hyponatremia   [place] [facility] [name], [name]

## 2024-03-24 ENCOUNTER — Other Ambulatory Visit: Payer: Self-pay | Admitting: Family Medicine

## 2024-03-27 ENCOUNTER — Other Ambulatory Visit: Payer: Self-pay

## 2024-03-27 ENCOUNTER — Telehealth: Payer: Self-pay

## 2024-03-27 DIAGNOSIS — E1159 Type 2 diabetes mellitus with other circulatory complications: Secondary | ICD-10-CM

## 2024-03-27 MED ORDER — FREESTYLE LIBRE 3 PLUS SENSOR MISC: 6 refills | Status: DC

## 2024-03-27 NOTE — Telephone Encounter (Signed)
 Copied from CRM [id number]. Topic: Clinical - Medication Question >> Mar 27, 2024  9:36 AM Michael Guzman wrote: Reason for CRM: glucose sensor lost signal, needs a new one- received this one from [employer]- please call [phone]

## 2024-03-29 ENCOUNTER — Other Ambulatory Visit: Payer: Self-pay | Admitting: *Deleted

## 2024-03-29 NOTE — Patient Outreach (Signed)
 Transition of Care week 3  Visit Note  03/29/2024  Name: Michael Guzman MRN: [id number]          DOB: [birth year]  Situation: Patient enrolled in [facility] 30-day program. Visit completed with patient by telephone.   Background:    Past Medical History:  Diagnosis Date   Arthritis    CAD (coronary artery disease)    a.s/p DES to mid LAD and OM2 06/2012.   Chronic back pain    Chronically dry eyes    Diabetes mellitus without complication (HCC)    TYPE 2    DKA (diabetic ketoacidoses) 01/30/2017   Elevated PSA    being monitored   Fatty liver    GERD (gastroesophageal reflux disease)    Hx of radiation therapy    prostate , [facility]    Hypertension    Hypertriglyceridemia    Intermediate coronary syndrome (HCC) 07/26/2012   Nausea vomiting and diarrhea 11/11/2023   Neuromuscular disorder (HCC)    neuropathy feet and legs   Prostate cancer (HCC)    RADTIATION     Assessment: Patient Reported Symptoms: Cognitive Cognitive Status: No symptoms reported, Able to follow simple commands, Alert and oriented to person, place, and time, Normal speech and language skills      Neurological Neurological Review of Symptoms: No symptoms reported    HEENT HEENT Symptoms Reported: No symptoms reported      Cardiovascular Cardiovascular Symptoms Reported: No symptoms reported Does patient have uncontrolled Hypertension?: No Cardiovascular Management Strategies: Routine screening, Medication therapy Cardiovascular Self-Management Outcome: 4 (good)  Respiratory Respiratory Symptoms Reported: No symptoms reported    Endocrine Endocrine Symptoms Reported: No symptoms reported Is patient diabetic?: Yes Is patient checking blood sugars at home?: Yes List most recent blood sugar readings, include date and time of day: FBS today 150, pt is using CGM monitor and states this is working well, pt is able to change sensor himself, no issues reported Endocrine Self-Management  Outcome: 4 (good) Endocrine Comment: Reinforced carbohydrate modified diet  Gastrointestinal Gastrointestinal Symptoms Reported: No symptoms reported      Genitourinary Genitourinary Symptoms Reported: No symptoms reported    Integumentary Integumentary Symptoms Reported: No symptoms reported    Musculoskeletal Musculoskelatal Symptoms Reviewed: Limited mobility Additional Musculoskeletal Details: uses cane when leaves home Musculoskeletal Management Strategies: Adequate rest, Routine screening Musculoskeletal Self-Management Outcome: 4 (good)      Psychosocial Psychosocial Symptoms Reported: No symptoms reported         There were no vitals filed for this visit.  Medications Reviewed Today     Reviewed by Aura Mliss LABOR, RN (Registered Nurse) on 03/29/24 at 1021  Med List Status: <None>   Medication Order Taking? Sig Documenting Provider Last Dose Status Informant  Ascorbic Acid (VITAMIN C) 1000 MG tablet [id number]  Take 1,000 mg by mouth in the morning. [provider]  Active Self, Pharmacy Records  aspirin  EC 81 MG tablet [id number]  Take 1 tablet (81 mg total) by mouth daily. [name], MD  Active Self, Pharmacy Records           Med Note [name] Sep 17, 2022  2:44 PM)    atorvastatin  (LIPITOR ) 10 MG tablet [id number]  TAKE 1 TABLET BY MOUTH DAILY [name], [name], [name]  Active Self, Pharmacy Records  B Complex-C (SUPER B COMPLEX PO) [id number]  Take 1 tablet by mouth in the morning. [provider]  Active Self, Pharmacy Records  Blood Glucose  Monitoring Suppl (ONE TOUCH ULTRA 2) w/Device KIT [id number]  Use as directed [name], [name], [name]  Active Self, Pharmacy Records  Cholecalciferol  (VITAMIN D3) 250 MCG (10000 UT) capsule [id number]  Take 10,000 Units by mouth in the morning. [provider]  Active Self, Pharmacy Records  Continuous Glucose Sensor (FREESTYLE LIBRE 3 PLUS SENSOR) [place] [id number]  Use to monitor blood sugar  continuously. Change sensor every 15 days. Kayla Gauze S, [name]  Active   gabapentin  (NEURONTIN ) 800 MG tablet [id number]  TAKE 1 TABLET BY MOUTH 3 TO 4 TIMES DAILY FOR PAIN, HOT FLASHES AND SWEATS Kayla Gauze RAMAN, [name]  Active Self, Pharmacy Records  glucose blood [facility] ULTRA) test strip [id number]  CHECK BLOOD SUGAR 3 TIMES  DAILY Kayla Gauze RAMAN, [name]  Active Self, Pharmacy Records  glycopyrrolate  (ROBINUL ) 2 MG tablet [id number]  Take  1 tablet  3 x / day for Excessive Sweating  TAKE ONE TABLET BY                                                 MOUTH Kayla Gauze RAMAN, [name]  Active Self, Pharmacy Records  insulin  NPH-regular Human (HUMULIN  70/30) (70-30) 100 UNIT/ML injection [id number]  INJECT SUBCUTANEOUSLY 50  UNITS TWICE DAILY Kayla Gauze RAMAN, [name]  Active Self, Pharmacy Records  Insulin  Pen Needle (NOVOFINE) 30G X 8 MM MISC [id number]  Inject 10 each into the skin as needed. Kayla Gauze RAMAN, [name]  Active   Insulin  Syringes, Disposable, U-100 1 ML MISC [id number]  50 Units by Does not apply route 2 (two) times daily. inject 50 units into skin 2 x /day [name], MD  Active Self, Pharmacy Records  isosorbide  mononitrate (IMDUR ) 30 MG 24 hr tablet [id number]  Take 1 tablet (30 mg total) by mouth daily. Kayla Gauze RAMAN, [name]  Active Self, Pharmacy Records  Lancets [facility] CATHRYNE PLUS [place]) MISC [id number]  CHECK BLOOD SUGAR 3 TIMES  DAILY [name], MD  Active Self, Pharmacy Records  latanoprost  (XALATAN ) 0.005 % ophthalmic solution [id number]  Place 1 drop into both eyes at bedtime. [provider]  Active Self, Pharmacy Records  Magnesium  500 MG TABS [id number]  Take 500 mg by mouth in the morning. [provider]  Active Self, Pharmacy Records  metFORMIN  (GLUCOPHAGE -XR) 500 MG 24 hr tablet [id number]  TAKE 2 TABLETS BY MOUTH TWICE  DAILY WITH MEALS FOR DIABETES [name], [name], [name]  Active Self, Pharmacy Records  metoprolol  succinate (TOPROL -XL) 25 MG 24 hr tablet  [id number]  TAKE ONE-HALF TABLET BY MOUTH  DAILY [name], MD  Active Self, Pharmacy Records  montelukast  (SINGULAIR ) 10 MG tablet [id number]  TAKE 1 TABLET BY MOUTH DAILY FOR ALLERGIES Kayla Gauze RAMAN, [name]  Active   nitroGLYCERIN  (NITROSTAT ) 0.4 MG SL tablet [id number]  DISSOLVE 1 TABLET UNDER THE  TONGUE EVERY 5 MINUTES AS NEEDED FOR CHEST PAIN. MAX OF 3 TABLETS IN 15 MINUTES. CALL 911 IF PAIN  PERSISTS. Kayla Gauze RAMAN, [name]  Active Self, Pharmacy Records  pantoprazole  (PROTONIX ) 40 MG tablet [id number]  TAKE 1 TABLET BY MOUTH DAILY TO  PREVENT HEARTBURN AND  INDIGESTION Kayla Gauze RAMAN, [name]  Active   sodium chloride  1 g tablet [id number]  Take 1 tablet (1 g total) by mouth 2 (two) times daily with a meal. [name], DO  Active  tamsulosin  (FLOMAX ) 0.4 MG CAPS capsule [id number]  Take 0.4 mg by mouth at bedtime. [provider]  Active Self, Pharmacy Records  Vitamin E 450 MG (1000 UT) CAPS [id number]  Take 450 mg by mouth in the morning. [provider]  Active Self, Pharmacy Records  zinc gluconate 50 MG tablet [id number]  Take 50 mg by mouth in the morning. [provider]  Active Self, Pharmacy Records            Recommendation:   PCP Follow-up  Follow Up Plan:   Telephone follow-up 04/06/24 @ 2 pm  Mliss Creed [facility], BSN RN Care Manager/ Transition of Care [place]/ [facility] [phone]

## 2024-03-29 NOTE — Patient Instructions (Signed)
 Visit Information  Thank you for taking time to visit with me today. Please don't hesitate to contact me if I can be of assistance to you before our next scheduled telephone appointment.  Our next appointment is by telephone on 04/06/24 @ 1030 am   Following is a copy of your care plan:   Goals Addressed             This Visit's Progress    VBCI Transitions of Care (TOC) Care Plan       Problems:  Recent Hospitalization for treatment of Hyponatremia No Hospital Follow Up Provider appointment - RN Care Manager collaborated with care guide, scheduled post hospital follow up for 03/21/24 @ 1015 am Patient states [facility] sent him insulin  pens and pt told them not to send him any needles for the pens because he had vials of insulin  left and was using the vials, pt has one vial left and has insulin  pens on hands but no needles, pt states he is calling [facility] today to have them send the needles for the pens, pt states if he has any issues with this process he will contact RN CM for assistance. 03/21/24-pt reports he saw primary care provider and now has CGM for monitoring blood sugar, pt states  I don't know the name of this, I'm going back for them to change everything and make sure it's working okay,  FBS this morning 74, pt states he still has Humulin  70/30 in vials, states [facility] never sent needles for pens because they had sent previously, pt does not know if he lost them but states he is tired of dealing with [facility] and will not be talking to them again,  RN Care Manager suggested referral to pharmacist for management/ reinforcement/ teaching of new CGM and making sure pt has insulin , needles for pens on hand, etc.  Pt adamantly refuses referral for pharmacist, states he does not want anything else added to make more confusion,  pt feels he is doing well managing CGM and has Humulin  70/30 insulin  vials on hand. 03/29/24- pt reports doing very well, no concerns reported, no issues with CGM, FBS  today 150 and alarms for readings >250, pt states he has had no low readings, pt states he has insulin , pens and all needed supplies. Goal:  Over the next 30 days, the patient will not experience hospital readmission  Interventions:  Evaluation of current treatment plan related to Hyponatremia, Limited education about diabetes, carbohydrate modified diet* self-management and patient's adherence to plan as established by provider. Discussed plans with patient for ongoing care management follow up and provided patient with direct contact information for care management team Evaluation of current treatment plan related to Hyponatremia, diabetes and patient's adherence to plan as established by provider Reinforced in depth carbohydrate modified diet, plate method Reviewed safety precautions, importance of using cane as needed Reinforced signs/ symptoms hyponatremia, being mindful if sweats a lot on hot days to take Rubinol as prescribed Reviewed upcoming scheduled appointments including primary care provider 04/06/24 @ 2 pm  Patient Self Care Activities:  Attend all scheduled provider appointments Attend church or other social activities Call pharmacy for medication refills 3-7 days in advance of running out of medications Call provider office for new concerns or questions  Notify RN Care Manager of TOC call rescheduling needs Participate in Transition of Care Program/Attend TOC scheduled calls Take medications as prescribed   check blood sugar at prescribed times: per doctor's order/ recommendation enter blood sugar  readings and medication or insulin  into daily log take the blood sugar log to all doctor visits fill half of plate with vegetables manage portion size set a realistic goal Primary care provider appointment- 04/05/24 @ 1030 am  Plan:  Telephone follow up appointment with care management team member scheduled for:  04/06/24 @ 2 pm        Patient verbalizes understanding of  instructions and care plan provided today and agrees to view in MyChart. Active MyChart status and patient understanding of how to access instructions and care plan via MyChart confirmed with patient.     Telephone follow up appointment with care management team member scheduled for:  04/06/24 @ 2 pm  Please call the care guide team at [phone] if you need to cancel or reschedule your appointment.   Please call the Suicide and Crisis Lifeline: 988 call the [place]  National Suicide Prevention Lifeline: 904-416-2371 or TTY: (848) 027-4082 TTY (734) 534-6343) to talk to a trained counselor call 1-800-273-TALK (toll free, 24 hour hotline) go to [facility] [address], [place] [phone]) call the [facility] Crisis Line: (720)[phone] call 911 if you are experiencing a Mental Health or Behavioral Health Crisis or need someone to talk to.  Mliss Creed [facility], BSN RN Care Manager/ Transition of Care [place]/ [facility] [phone]

## 2024-03-30 ENCOUNTER — Ambulatory Visit: Attending: Internal Medicine

## 2024-03-30 ENCOUNTER — Other Ambulatory Visit: Payer: Self-pay

## 2024-03-30 DIAGNOSIS — R262 Difficulty in walking, not elsewhere classified: Secondary | ICD-10-CM | POA: Insufficient documentation

## 2024-03-30 DIAGNOSIS — M6281 Muscle weakness (generalized): Secondary | ICD-10-CM | POA: Insufficient documentation

## 2024-03-30 NOTE — Therapy (Signed)
 OUTPATIENT PHYSICAL THERAPY LOWER EXTREMITY EVALUATION   Patient Name: Michael Guzman MRN: [id number] DOB:April 26, 1946, 78 y.o., male Today's Date: 03/30/2024  END OF SESSION:  Visit Number 1 Number of Visits 16 Date for PT re-eval 05/25/2024  Authorization Type [employer] MCR  PT start time 1000 PT stop time 1038 PT time calculation (min) 38 min     Past Medical History:  Diagnosis Date   Arthritis    CAD (coronary artery disease)    a.s/p DES to mid LAD and OM2 06/2012.   Chronic back pain    Chronically dry eyes    Diabetes mellitus without complication (HCC)    TYPE 2    DKA (diabetic ketoacidoses) 01/30/2017   Elevated PSA    being monitored   Fatty liver    GERD (gastroesophageal reflux disease)    Hx of radiation therapy    prostate , [facility]    Hypertension    Hypertriglyceridemia    Intermediate coronary syndrome (HCC) 07/26/2012   Nausea vomiting and diarrhea 11/11/2023   Neuromuscular disorder (HCC)    neuropathy feet and legs   Prostate cancer (HCC)    RADTIATION    Past Surgical History:  Procedure Laterality Date   APPENDECTOMY     CARDIAC SURGERY  ~2017   3 stent placed.   COLONOSCOPY  02/12/2010   Buccini   COLONOSCOPY WITH PROPOFOL  N/A 09/21/2022   Procedure: COLONOSCOPY WITH PROPOFOL ;  Surgeon: Wilhelmenia Aloha Raddle., MD;  Location: [facility];  Service: Gastroenterology;  Laterality: N/A;   ENDOSCOPIC MUCOSAL RESECTION N/A 09/21/2022   Procedure: ENDOSCOPIC MUCOSAL RESECTION;  Surgeon: Wilhelmenia Aloha Raddle., MD;  Location: [facility];  Service: Gastroenterology;  Laterality: N/A;   HEMOSTASIS CLIP PLACEMENT  09/21/2022   Procedure: HEMOSTASIS CLIP PLACEMENT;  Surgeon: Wilhelmenia Aloha Raddle., MD;  Location: [facility];  Service: Gastroenterology;;   LEFT HEART CATHETERIZATION WITH CORONARY ANGIOGRAM N/A 06/27/2012   Procedure: LEFT HEART CATHETERIZATION WITH CORONARY ANGIOGRAM;  Surgeon: Oneil Parchment, MD;  Location: [facility] CATH  LAB;  Service: Cardiovascular;  Laterality: N/A;   LEFT HEART CATHETERIZATION WITH CORONARY ANGIOGRAM N/A 07/25/2012   Procedure: LEFT HEART CATHETERIZATION WITH CORONARY ANGIOGRAM;  Surgeon: Wilbert JONELLE Bihari, MD;  Location: [facility] CATH LAB;  Service: Cardiovascular;  Laterality: N/A;   PROSTATE BIOPSY     RADIOLOGY WITH ANESTHESIA N/A 12/03/2015   Procedure: MRI LUMBAR SPINE;  Surgeon: Medication Radiologist, MD;  Location: [facility] OR;  Service: Radiology;  Laterality: N/A;   SUBMUCOSAL LIFTING INJECTION  09/21/2022   Procedure: SUBMUCOSAL LIFTING INJECTION;  Surgeon: Wilhelmenia Aloha Raddle., MD;  Location: [facility];  Service: Gastroenterology;;   TOTAL HIP ARTHROPLASTY Right 04/28/2019   Procedure: TOTAL HIP ARTHROPLASTY ANTERIOR APPROACH;  Surgeon: Yvone Rush, MD;  Location: [facility];  Service: Orthopedics;  Laterality: Right;   Patient Active Problem List   Diagnosis Date Noted   Anemia 02/28/2024   Encounter for Medicare annual wellness exam 12/09/2023   Depression, recurrent (HCC) 11/22/2023   Hyponatremia 11/11/2023   Adenomatous polyp of transverse colon 06/18/2022   Hx of adenomatous colonic polyps 06/18/2022   Diaphoresis 08/04/2021   Hypersomnia with sleep apnea 08/04/2021   Excessive daytime sleepiness 08/04/2021   Excessive postexertional fatigue 08/04/2021   Diabetes mellitus with coincident hypertension (HCC) 06/17/2021   Hypoglycemia due to insulin  01/11/2021   Primary osteoarthritis of hip 04/30/2019   Former smoker 02/02/2019   Fatty infiltration of liver 11/30/2018   Hypertriglyceridemia    Microalbuminuric diabetic nephropathy (HCC) 06/01/2018  Aortic atherosclerosis (HCC) by Abd CT scan 2015 05/30/2018   Type 2 diabetes mellitus with vascular disease (HCC) 01/31/2018   Thyroiditis 05/12/2016   Malignant neoplasm of prostate (HCC) 05/05/2016   Diabetic peripheral neuropathy (HCC) 03/06/2015   Medication management 07/24/2014   Vitamin D  deficiency 03/06/2014    Hyperlipidemia associated with type 2 diabetes mellitus (HCC) 06/28/2012   ASCAD s/p PTCA (06/2012) 06/27/2012   Hypertension (1991) 06/25/2012   GERD 06/25/2012    PCP: [name], [name]  REFERRING PROVIDER:  [name], DO   REFERRING DIAG: R26.89 (ICD-10-CM) - Other abnormalities of gait and mobility   THERAPY DIAG:  No diagnosis found.  Rationale for Evaluation and Treatment: Rehabilitation  ONSET DATE: 03/05/2024  SUBJECTIVE:   SUBJECTIVE STATEMENT: Patient reports that he has had this weakness for 5 years. He recalls going to the hospital 3x in the past 2 months, and states that they just discovered his sodium was low during hospitalization 03/05/2024. He states that had PT following his hip replacement in 2020, but had a short POC opting to work on HEP at home. He states that he gets out of breath easily. He sometimes walks up the hill to the mailbox and gets short of breath. He also tends to the garden daily. He states that he only uses his cane for community ambulation.   Patient had recent hospitalization d/t hyponatremia, HTN; physical debilitation muscle weakness  PERTINENT HISTORY: Relevant PMHx includes arthritis, CAD, DM type II, DKA, HTN, LE neuropathy, R THA (2020), Anemia, Depression/Grief (his wife passed away 2 years ago)  PAIN:  Are you having pain? No  PRECAUTIONS: None  RED FLAGS: None   WEIGHT BEARING RESTRICTIONS: No  FALLS:  Has patient fallen in last 6 months? Yes. Number of falls 1; I was carrying groceries upstairs and just kneeled down after my foot got caught on the last step   LIVING ENVIRONMENT: Lives with: lives alone; his children come over daily.  Lives in: House/apartment Stairs: Yes: External: 3 steps; bilateral but cannot reach both; I don't use the front stairs, because there are no railing on that one. Has following equipment at home: Quad cane small base  OCCUPATION: n/a   PLOF: Independent, Independent with  household mobility without device, and Independent with community mobility without device  PATIENT GOALS: To improve strength and endurance if possible.   NEXT MD VISIT: 04/05/2024 with PCP   OBJECTIVE:  Note: Objective measures were completed at Evaluation unless otherwise noted.  DIAGNOSTIC FINDINGS:   No relevant imaging results available in epic; none included in referral    PATIENT SURVEYS:  PSFS: ***   COGNITION: Overall cognitive status: Within functional limits for tasks assessed      LOWER EXTREMITY MMT:  MMT Right eval Left eval  Hip flexion 4+ 4+  Hip extension    Hip abduction 4+ 4+  Hip adduction 4 4  Hip internal rotation    Hip external rotation    Knee flexion 5 5  Knee extension    Ankle dorsiflexion    Ankle plantarflexion    Ankle inversion    Ankle eversion     (Blank rows = not tested)   FUNCTIONAL TESTS:  2 minute walk test: ***  GAIT: Distance walked: *** Assistive device utilized: Quad cane small base Level of assistance: {Levels of assistance:24026} Comments: ***  TREATMENT DATE:   [facility] Adult PT Treatment:                                                DATE: 03/30/2024   Initial evaluation: see patient education and home exercise program as noted below      PATIENT EDUCATION:  Education details: reviewed initial home exercise program; discussion of POC, prognosis and goals for skilled PT   Person educated: {Person educated:25204} Education method: {Education Method:25205} Education comprehension: {Education Comprehension:25206}  HOME EXERCISE PROGRAM: ***  ASSESSMENT:  CLINICAL IMPRESSION: Patient is a *** y.o. male/male  who was seen today for physical therapy evaluation and treatment for ***. He/She is demonstrating ***. He/She has related pain and difficulty with ***. He/She requires skilled  PT services at this time to address relevant deficits and improve overall function.     OBJECTIVE IMPAIRMENTS: {opptimpairments:25111}.   ACTIVITY LIMITATIONS: {activitylimitations:27494}  PARTICIPATION LIMITATIONS: {participationrestrictions:25113}  PERSONAL FACTORS: {Personal factors:25162} are also affecting patient's functional outcome.   REHAB POTENTIAL: {rehabpotential:25112}  CLINICAL DECISION MAKING: {clinical decision making:25114}  EVALUATION COMPLEXITY: {Evaluation complexity:25115}   GOALS: Goals reviewed with patient? {yes/no:20286}  SHORT TERM GOALS: Target date: *** *** Baseline: Goal status: INITIAL  2.  *** Baseline:  Goal status: INITIAL  3.  *** Baseline:  Goal status: INITIAL  4.  *** Baseline:  Goal status: INITIAL  5.  *** Baseline:  Goal status: INITIAL  6.  *** Baseline:  Goal status: INITIAL  LONG TERM GOALS: Target date: ***  *** Baseline:  Goal status: INITIAL  2.  *** Baseline:  Goal status: INITIAL  3.  *** Baseline:  Goal status: INITIAL  4.  *** Baseline:  Goal status: INITIAL  5.  *** Baseline:  Goal status: INITIAL  6.  *** Baseline:  Goal status: INITIAL   PLAN:  PT FREQUENCY: {rehab frequency:25116}  PT DURATION: {rehab duration:25117}  PLANNED INTERVENTIONS: {rehab planned interventions:25118::97110-Therapeutic exercises,97530- Therapeutic [phone]- Neuromuscular re-education,97535- Self Rjmz,02859- Manual therapy}  PLAN FOR NEXT SESSION: PIERRETTE Marko Molt, PT 03/30/2024, 9:58 AM

## 2024-03-31 ENCOUNTER — Telehealth: Payer: Self-pay

## 2024-03-31 NOTE — Progress Notes (Signed)
 Care Guide Pharmacy Note  03/31/2024 Name: Michael Guzman MRN: [id number] DOB: 08-Aug-1946  Referred By: [name], [name] Reason for referral: Complex Care Management, Call Attempt #1 (Unsuccessful Outreach to schedule with PHARM DGLENWOOD Cadet. ), and Call Attempt #2 (Outreach scheduled with [name])   Michael Guzman is a 78 y.o. year old male who is a primary care patient of [name], [name].  Michael Guzman was referred to the pharmacist for assistance related to: DMII  Successful contact was made with the patient to discuss pharmacy services including being ready for the pharmacist to call at least 5 minutes before the scheduled appointment time and to have medication bottles and any blood pressure readings ready for review. The patient agreed to meet with the pharmacist via telephone visit on (date/time). 04/14/24 @ 9 AM.  Michael Guzman  Michael Guzman, Michael Guzman Guide  Direct Dial: [phone]  Fax [phone]

## 2024-03-31 NOTE — Progress Notes (Signed)
 Care Guide Pharmacy Note  03/31/2024 Name: [name] MRN: [id number] DOB: 26-Apr-1946  Referred By: [name], [name] Reason for referral: Complex Care Management and Call Attempt #1 (Unsuccessful Outreach to schedule with PHARM D- Gray )   Michael Guzman is a 78 y.o. year old male who is a primary care patient of [name], [name].  Jarvin M Tartaglia was referred to the pharmacist for assistance related to: DMII  An unsuccessful telephone outreach was attempted today to contact the patient who was referred to the pharmacy team for assistance with medication management. Additional attempts will be made to contact the patient.  Leotis Rase [facility], [facility] Guide  Direct Dial: [phone]  Fax [phone]

## 2024-04-04 ENCOUNTER — Ambulatory Visit

## 2024-04-04 DIAGNOSIS — M6281 Muscle weakness (generalized): Secondary | ICD-10-CM | POA: Diagnosis not present

## 2024-04-04 DIAGNOSIS — R262 Difficulty in walking, not elsewhere classified: Secondary | ICD-10-CM

## 2024-04-04 NOTE — Therapy (Signed)
 OUTPATIENT PHYSICAL THERAPY TREATMENT NOTE   Patient Name: Michael Guzman MRN: [id number] DOB:03/26/1946, 78 y.o., male Today's Date: 04/04/2024  END OF SESSION:  PT End of Session - 04/04/24 0851     Visit Number 2    Number of Visits 16    Date for PT Re-Evaluation 05/25/24    Authorization Type [employer] MCR    Authorization Time Period Auth submitted    PT Start Time 0915    PT Stop Time 0953    PT Time Calculation (min) 38 min    Activity Tolerance Patient tolerated treatment well    Behavior During Therapy [facility] for tasks assessed/performed          Past Medical History:  Diagnosis Date   Arthritis    CAD (coronary artery disease)    a.s/p DES to mid LAD and OM2 06/2012.   Chronic back pain    Chronically dry eyes    Diabetes mellitus without complication (HCC)    TYPE 2    DKA (diabetic ketoacidoses) 01/30/2017   Elevated PSA    being monitored   Fatty liver    GERD (gastroesophageal reflux disease)    Hx of radiation therapy    prostate , [facility]    Hypertension    Hypertriglyceridemia    Intermediate coronary syndrome (HCC) 07/26/2012   Nausea vomiting and diarrhea 11/11/2023   Neuromuscular disorder (HCC)    neuropathy feet and legs   Prostate cancer (HCC)    RADTIATION    Past Surgical History:  Procedure Laterality Date   APPENDECTOMY     CARDIAC SURGERY  ~2017   3 stent placed.   COLONOSCOPY  02/12/2010   Buccini   COLONOSCOPY WITH PROPOFOL  N/A 09/21/2022   Procedure: COLONOSCOPY WITH PROPOFOL ;  Surgeon: Mansouraty, Aloha Raddle., Guzman;  Location: [facility];  Service: Gastroenterology;  Laterality: N/A;   ENDOSCOPIC MUCOSAL RESECTION N/A 09/21/2022   Procedure: ENDOSCOPIC MUCOSAL RESECTION;  Surgeon: Michael Aloha Raddle., Guzman;  Location: [facility];  Service: Gastroenterology;  Laterality: N/A;   HEMOSTASIS CLIP PLACEMENT  09/21/2022   Procedure: HEMOSTASIS CLIP PLACEMENT;  Surgeon: Michael Aloha Raddle., Guzman;  Location: [facility];  Service: Gastroenterology;;   LEFT HEART CATHETERIZATION WITH CORONARY ANGIOGRAM N/A 06/27/2012   Procedure: LEFT HEART CATHETERIZATION WITH CORONARY ANGIOGRAM;  Surgeon: Michael Guzman;  Location: [facility] C[facility] LAB;  Service: Cardiovascular;  Laterality: N/A;   LEFT HEART CATHETERIZATION WITH CORONARY ANGIOGRAM N/A 07/25/2012   Procedure: LEFT HEART CATHETERIZATION WITH CORONARY ANGIOGRAM;  Surgeon: Michael Guzman;  Location: [facility] CATH LAB;  Service: Cardiovascular;  Laterality: N/A;   PROSTATE BIOPSY     RADIOLOGY WITH ANESTHESIA N/A 12/03/2015   Procedure: MRI LUMBAR SPINE;  Surgeon: Michael Guzman;  Location: [facility] OR;  Service: Radiology;  Laterality: N/A;   SUBMUCOSAL LIFTING INJECTION  09/21/2022   Procedure: SUBMUCOSAL LIFTING INJECTION;  Surgeon: Michael Aloha Raddle., Guzman;  Location: [facility];  Service: Gastroenterology;;   TOTAL HIP ARTHROPLASTY Right 04/28/2019   Procedure: TOTAL HIP ARTHROPLASTY ANTERIOR APPROACH;  Surgeon: Michael Guzman;  Location: [facility];  Service: Orthopedics;  Laterality: Right;   Patient Active Problem List   Diagnosis Date Noted   Anemia 02/28/2024   Encounter for Medicare annual wellness exam 12/09/2023   Depression, recurrent (HCC) 11/22/2023   Hyponatremia 11/11/2023   Adenomatous polyp of transverse colon 06/18/2022   Hx of adenomatous colonic polyps 06/18/2022   Diaphoresis 08/04/2021   Hypersomnia with sleep apnea 08/04/2021  Excessive daytime sleepiness 08/04/2021   Excessive postexertional fatigue 08/04/2021   Diabetes mellitus with coincident hypertension (HCC) 06/17/2021   Hypoglycemia due to insulin  01/11/2021   Primary osteoarthritis of hip 04/30/2019   Former smoker 02/02/2019   Fatty infiltration of liver 11/30/2018   Hypertriglyceridemia    Microalbuminuric diabetic nephropathy (HCC) 06/01/2018   Aortic atherosclerosis (HCC) by Abd CT scan 2015 05/30/2018   Type 2 diabetes mellitus with vascular disease (HCC)  01/31/2018   Thyroiditis 05/12/2016   Malignant neoplasm of prostate (HCC) 05/05/2016   Diabetic peripheral neuropathy (HCC) 03/06/2015   Michael management 07/24/2014   Vitamin D  deficiency 03/06/2014   Hyperlipidemia associated with type 2 diabetes mellitus (HCC) 06/28/2012   ASCAD s/p PTCA (06/2012) 06/27/2012   Hypertension (1991) 06/25/2012   GERD 06/25/2012    PCP: Michael Guzman  REFERRING PROVIDER:  [name], Guzman   REFERRING DIAG: R26.89 (ICD-10-CM) - Other abnormalities of gait and mobility   THERAPY DIAG:  Muscle weakness (generalized)  Difficulty in walking, not elsewhere classified  Rationale for Evaluation and Treatment: Rehabilitation  ONSET DATE: 03/05/2024  SUBJECTIVE:   SUBJECTIVE STATEMENT: Patient reports that he is hurting all over today. He states he felt fine over the weekend, says he feels worse with the rainy weather.  EVAL: Patient reports that he has had this weakness for 5 years. He recalls going to the hospital 3x in the past 2 months, and states that they just discovered his sodium was low during hospitalization 03/05/2024. He states that had PT following his hip replacement in 2020, but had a short POC opting to work on HEP at home. He states that he gets out of breath easily. He sometimes walks up the hill to the mailbox and gets short of breath. He also tends to the garden daily. He states that he only uses his cane for community ambulation.   Patient had recent hospitalization d/t hyponatremia, HTN; physical debilitation muscle weakness  PERTINENT HISTORY: Relevant PMHx includes arthritis, CAD, DM type II, DKA, HTN, LE neuropathy, R THA (2020), Anemia, Depression/Grief (his wife passed away 2 years ago)  PAIN:  Are you having pain? No  PRECAUTIONS: None  RED FLAGS: None   WEIGHT BEARING RESTRICTIONS: No  FALLS:  Has patient fallen in last 6 months? Yes. Number of falls 1; I was carrying groceries upstairs and just  kneeled down after my foot got caught on the last step   LIVING ENVIRONMENT: Lives with: lives alone; his children come over daily.  Lives in: House/apartment Stairs: Yes: External: 3 steps; bilateral but cannot reach both; I don't use the front stairs, because there are no railing on that one. Has following equipment at home: Quad cane small base  OCCUPATION: n/a   PLOF: Independent, Independent with household mobility without device, and Independent with community mobility without device  PATIENT GOALS: To improve strength and endurance if possible.   NEXT Guzman VISIT: 04/05/2024 with PCP   OBJECTIVE:  Note: Objective measures were completed at Evaluation unless otherwise noted.  DIAGNOSTIC FINDINGS:   No relevant imaging results available in epic; none included in referral    PATIENT SURVEYS:   THE PATIENT SPECIFIC FUNCTIONAL SCALE  Place score of 0-10 (0 = unable to perform activity and 10 = able to perform activity at the same level as before injury or problem)  Activity Date: 03/30/24    Walk (>20 min) 8    2.Heavy Lifting (ie. Top soil)  5  3. Stairs 5    4.      Total Score 6      Total Score = Sum of activity scores/number of activities  Minimally Detectable Change: 3 points (for single activity); 2 points (for average score)  [facility] (nd). The Patient Specific Functional Scale . Retrieved from [email or web address]    COGNITION: Overall cognitive status: Within functional limits for tasks assessed      LOWER EXTREMITY MMT:  MMT Right eval Left eval  Hip flexion 4+ 4+  Hip extension    Hip abduction 4+ 4+  Hip adduction 4 4  Hip internal rotation    Hip external rotation    Knee flexion 5 5  Knee extension    Ankle dorsiflexion    Ankle plantarflexion    Ankle inversion    Ankle eversion     (Blank rows = not tested)   FUNCTIONAL TESTS:  2 minute walk test: 255 ft; use of  quad cane for initial 30 seconds of test.   GAIT: Distance walked: 255 ft (see above) Assistive device utilized: Quad cane small base Level of assistance: Complete Independence and Modified independence Comments: ambulates with R>L stance time, steady gait speed                                                                                                                                 TREATMENT DATE:  [facility] Adult PT Treatment:                                                DATE: 04/04/24 Therapeutic Exercise: Nustep level 3 x 8 mins to address aerobic capacity Heel raises 2x10 Standing hip abduction/extension x10 ea BIL Seated hip adduction 5 hold 2x10 Therapeutic Activity: Standing marching 3x30 single UE support Step ups 4 fwd/lat x10 ea BIL STS from low chair 2x10 arms crossed (holding 5# KB next session?) Gait 2 laps x185' without cane   [facility] Adult PT Treatment:                                                DATE: 03/30/2024   Initial evaluation: see patient education and home exercise program as noted below      PATIENT EDUCATION:  Education details: reviewed initial home exercise program; discussion of POC, prognosis and goals for skilled PT   Person educated: Patient Education method: Explanation, Demonstration, and Handouts Education comprehension: verbalized understanding, returned demonstration, and needs further education  HOME EXERCISE PROGRAM: Access Code: [email or web address] URL: [email or web address] Date: 04/04/2024 Prepared by: Corean Pouch  Exercises - Heel Raises with Counter Support  - 1 x daily - 7 x weekly -  2 sets - 10 reps - Standing Marching  - 1 x daily - 7 x weekly - 2 sets - 10 reps - Sit to Stand  - 1 x daily - 7 x weekly - 2 sets - 10 reps   ASSESSMENT:  CLINICAL IMPRESSION: Patient presents to first follow up PT session reporting that he feels overall poor today and he partially attributes this to the weather. He  initially begins session not thinking he can complete entire session, but does well with gentle motivation and is able to complete all prescribed exercises. Incorporated standing exercises as able to improve standing activity tolerance.Created HEP this session with patient demonstrating understanding of each exercise. Patient was able to tolerate all prescribed exercises with no adverse effects. Patient continues to benefit from skilled PT services and should be progressed as able to improve functional independence.   EVAL: Michael Guzman is a 78 y.o. male who was seen today for physical therapy evaluation and treatment for BIL LE weakness following recent hospitalization. He is demonstrating decreased LE MMT BIL, decreased gait speed, and intermittent use of cane with ambulation. He has related pain and difficulty with long periods of walking, heavy lifting as related to household and yardwork responsibilities, and navigating stiars. He requires skilled PT services at this time to address relevant deficits and improve overall function.     OBJECTIVE IMPAIRMENTS: Abnormal gait, decreased endurance, and decreased strength.   ACTIVITY LIMITATIONS: carrying, lifting, standing, squatting, and stairs  PARTICIPATION LIMITATIONS: interpersonal relationship, community activity, and yard work  PERSONAL FACTORS: Age, Past/current experiences, Time since onset of injury/illness/exacerbation, and 3+ comorbidities: Relevant PMHx includes arthritis, CAD, DM type II, DKA, HTN, LE neuropathy, R THA (2020), Anemia, Depression are also affecting patient's functional outcome.   REHAB POTENTIAL: Fair    CLINICAL DECISION MAKING: Evolving/moderate complexity  EVALUATION COMPLEXITY: Moderate   GOALS: Goals reviewed with patient? YES  SHORT TERM GOALS: Target date: 04/27/2024   Patient will be independent with initial home program at least 3 days/week.  Baseline: provided at eval Goal Status: INITIAL   2.  Patient  will demonstrate at least 4+/5 MMT hip adduction strength BIL.  Baseline: see objective measures Goal Status: INITIAL     LONG TERM GOALS: Target date: 05/25/2024   Patient will report improved overall functional ability with PSFS score of 8 or greater.  Baseline: 6 Goal Status: INITIAL    2.  Patient will demonstrate ability to safely ascend/descend 8 step height at least 20 times.  Baseline: limited d/t mm fatigue  Goal status: INITIAL  3.  Patient will demonstrate ability to perform floor to waist lifting of at least 25# x 10 repetitions, using appropriate body mechanics and with no more than minimal pain in order to safely perform normal daily/occupational tasks.   Baseline:   Goal Status: INITIAL   4.  Patient will ambulate 300 ft or more during , in order to demonstrate diminished fall risk and improved community ambulation.  Baseline: 258ft (MDC: 40 ft)  Goal status: INITIAL     PLAN:  PT FREQUENCY: 1-2x/week  PT DURATION: 8 weeks  PLANNED INTERVENTIONS: [id number]- PT Re-evaluation, 97750- Physical Performance Testing, 97110-Therapeutic exercises, 97530- Therapeutic activity, [id number]- Neuromuscular re-education, 97535- Self Care, [id number]- Manual therapy, [phone]- Aquatic Therapy, Patient/Family education, Balance training, Stair training, Cryotherapy, and Moist heat  PLAN FOR NEXT SESSION: LE strengthening program, use of nustep to address aerobic capacity, further functional tests as indicated for static/dynamic balance and address related deficits as indicated  Date of referral: 03/10/2024 Referring provider:  [name], Guzman    Referring diagnosis? R26.89 (ICD-10-CM) - Other abnormalities of gait and mobility  Treatment diagnosis? (if different than referring diagnosis)  Muscle weakness (generalized) Difficulty in walking, not elsewhere classified  What was this (referring dx) caused by? Ongoing Issue  Lysle of Condition: Chronic (continuous duration > 3  months)   Laterality: Both  Current Functional Measure Score: Patient Specific Functional Scale 6/10  Objective measurements identify impairments when they are compared to normal values, the uninvolved extremity, and prior level of function.  [x]  Yes  []  No  Objective assessment of functional ability: Moderate functional limitations   Briefly describe symptoms: BIL LE weakness, decreased endurance with daily activities  How did symptoms start: gradual onset, without known MOI. Patient has hx of R THA  Average pain intensity:  Last 24 hours: 0/10  Past week: 0/10  How often does the pt experience symptoms? Frequently  How much have the symptoms interfered with usual daily activities? Moderately  How has condition changed since care began at this facility? NA - initial visit  In general, how is the patients overall health? Fair   BACK PAIN (STarT Back Screening Tool) No   Corean Pouch PTA  04/04/2024 9:53 AM

## 2024-04-05 ENCOUNTER — Telehealth: Payer: Self-pay | Admitting: Pharmacy Technician

## 2024-04-05 ENCOUNTER — Other Ambulatory Visit (HOSPITAL_COMMUNITY): Payer: Self-pay

## 2024-04-05 ENCOUNTER — Ambulatory Visit: Admitting: Family Medicine

## 2024-04-05 ENCOUNTER — Encounter: Payer: Self-pay | Admitting: Family Medicine

## 2024-04-05 VITALS — BP 118/80 | HR 86 | Temp 97.5°F | Ht 70.0 in | Wt 200.2 lb

## 2024-04-05 DIAGNOSIS — E119 Type 2 diabetes mellitus without complications: Secondary | ICD-10-CM

## 2024-04-05 DIAGNOSIS — E1159 Type 2 diabetes mellitus with other circulatory complications: Secondary | ICD-10-CM

## 2024-04-05 DIAGNOSIS — Z794 Long term (current) use of insulin: Secondary | ICD-10-CM | POA: Diagnosis not present

## 2024-04-05 MED ORDER — HUMULIN 70/30 (70-30) 100 UNIT/ML ~~LOC~~ SUSP: SUBCUTANEOUS | Status: DC

## 2024-04-05 MED ORDER — GLYCOPYRROLATE 2 MG PO TABS: ORAL_TABLET | ORAL | 3 refills | Status: DC

## 2024-04-05 NOTE — Therapy (Signed)
 OUTPATIENT PHYSICAL THERAPY TREATMENT NOTE   Patient Name: Michael Guzman MRN: [id number] DOB:12-29-1945, 78 y.o., male Today's Date: 04/06/2024  END OF SESSION:  PT End of Session - 04/06/24 0904     Visit Number 3    Number of Visits 16    Date for PT Re-Evaluation 05/25/24    Authorization Type [employer] MCR    Authorization Time Period Submitted for 12 visits    Authorization - Visit Number 2    Authorization - Number of Visits 12    PT Start Time 0915    PT Stop Time 0955    PT Time Calculation (min) 40 min    Activity Tolerance Patient tolerated treatment well    Behavior During Therapy [facility] for tasks assessed/performed          Past Medical History:  Diagnosis Date   Arthritis    CAD (coronary artery disease)    a.s/p DES to mid LAD and OM2 06/2012.   Chronic back pain    Chronically dry eyes    Diabetes mellitus without complication (HCC)    TYPE 2    DKA (diabetic ketoacidoses) 01/30/2017   Elevated PSA    being monitored   Fatty liver    GERD (gastroesophageal reflux disease)    Hx of radiation therapy    prostate , [facility]    Hypertension    Hypertriglyceridemia    Intermediate coronary syndrome (HCC) 07/26/2012   Nausea vomiting and diarrhea 11/11/2023   Neuromuscular disorder (HCC)    neuropathy feet and legs   Prostate cancer (HCC)    RADTIATION    Past Surgical History:  Procedure Laterality Date   APPENDECTOMY     CARDIAC SURGERY  ~2017   3 stent placed.   COLONOSCOPY  02/12/2010   Buccini   COLONOSCOPY WITH PROPOFOL  N/A 09/21/2022   Procedure: COLONOSCOPY WITH PROPOFOL ;  Surgeon: Wilhelmenia Aloha Raddle., MD;  Location: [facility];  Service: Gastroenterology;  Laterality: N/A;   ENDOSCOPIC MUCOSAL RESECTION N/A 09/21/2022   Procedure: ENDOSCOPIC MUCOSAL RESECTION;  Surgeon: Wilhelmenia Aloha Raddle., MD;  Location: [facility];  Service: Gastroenterology;  Laterality: N/A;   HEMOSTASIS CLIP PLACEMENT  09/21/2022   Procedure:  HEMOSTASIS CLIP PLACEMENT;  Surgeon: Wilhelmenia Aloha Raddle., MD;  Location: [facility];  Service: Gastroenterology;;   LEFT HEART CATHETERIZATION WITH CORONARY ANGIOGRAM N/A 06/27/2012   Procedure: LEFT HEART CATHETERIZATION WITH CORONARY ANGIOGRAM;  Surgeon: Oneil Parchment, MD;  Location: [facility] CATH LAB;  Service: Cardiovascular;  Laterality: N/A;   LEFT HEART CATHETERIZATION WITH CORONARY ANGIOGRAM N/A 07/25/2012   Procedure: LEFT HEART CATHETERIZATION WITH CORONARY ANGIOGRAM;  Surgeon: Wilbert JONELLE Bihari, MD;  Location: [facility] CATH LAB;  Service: Cardiovascular;  Laterality: N/A;   PROSTATE BIOPSY     RADIOLOGY WITH ANESTHESIA N/A 12/03/2015   Procedure: MRI LUMBAR SPINE;  Surgeon: Medication Radiologist, MD;  Location: [facility];  Service: Radiology;  Laterality: N/A;   SUBMUCOSAL LIFTING INJECTION  09/21/2022   Procedure: SUBMUCOSAL LIFTING INJECTION;  Surgeon: Wilhelmenia Aloha Raddle., MD;  Location: [facility];  Service: Gastroenterology;;   TOTAL HIP ARTHROPLASTY Right 04/28/2019   Procedure: TOTAL HIP ARTHROPLASTY ANTERIOR APPROACH;  Surgeon: Yvone Rush, MD;  Location: [facility];  Service: Orthopedics;  Laterality: Right;   Patient Active Problem List   Diagnosis Date Noted   Anemia 02/28/2024   Encounter for Medicare annual wellness exam 12/09/2023   Depression, recurrent (HCC) 11/22/2023   Hyponatremia 11/11/2023   Adenomatous polyp of transverse colon 06/18/2022  Hx of adenomatous colonic polyps 06/18/2022   Diaphoresis 08/04/2021   Hypersomnia with sleep apnea 08/04/2021   Excessive daytime sleepiness 08/04/2021   Excessive postexertional fatigue 08/04/2021   Diabetes mellitus with coincident hypertension (HCC) 06/17/2021   Hypoglycemia due to insulin  01/11/2021   Primary osteoarthritis of hip 04/30/2019   Former smoker 02/02/2019   Fatty infiltration of liver 11/30/2018   Hypertriglyceridemia    Microalbuminuric diabetic nephropathy (HCC) 06/01/2018   Aortic atherosclerosis (HCC) by Abd  CT scan 2015 05/30/2018   Type 2 diabetes mellitus with vascular disease (HCC) 01/31/2018   Thyroiditis 05/12/2016   Malignant neoplasm of prostate (HCC) 05/05/2016   Diabetic peripheral neuropathy (HCC) 03/06/2015   Medication management 07/24/2014   Vitamin D  deficiency 03/06/2014   Hyperlipidemia associated with type 2 diabetes mellitus (HCC) 06/28/2012   ASCAD s/p PTCA (06/2012) 06/27/2012   Hypertension (1991) 06/25/2012   GERD 06/25/2012    PCP: [name], [name]  REFERRING PROVIDER:  [name], DO   REFERRING DIAG: R26.89 (ICD-10-CM) - Other abnormalities of gait and mobility   THERAPY DIAG:  Muscle weakness (generalized)  Difficulty in walking, not elsewhere classified  Rationale for Evaluation and Treatment: Rehabilitation  ONSET DATE: 03/05/2024  SUBJECTIVE:   SUBJECTIVE STATEMENT: Patient reports that his lower back was stiff and sore yesterday after doing some yard work.   EVAL: Patient reports that he has had this weakness for 5 years. He recalls going to the hospital 3x in the past 2 months, and states that they just discovered his sodium was low during hospitalization 03/05/2024. He states that had PT following his hip replacement in 2020, but had a short POC opting to work on HEP at home. He states that he gets out of breath easily. He sometimes walks up the hill to the mailbox and gets short of breath. He also tends to the garden daily. He states that he only uses his cane for community ambulation.   Patient had recent hospitalization d/t hyponatremia, HTN; physical debilitation muscle weakness  PERTINENT HISTORY: Relevant PMHx includes arthritis, CAD, DM type II, DKA, HTN, LE neuropathy, R THA (2020), Anemia, Depression/Grief (his wife passed away 2 years ago)  PAIN:  Are you having pain? No  PRECAUTIONS: None  RED FLAGS: None   WEIGHT BEARING RESTRICTIONS: No  FALLS:  Has patient fallen in last 6 months? Yes. Number of falls 1; I was  carrying groceries upstairs and just kneeled down after my foot got caught on the last step   LIVING ENVIRONMENT: Lives with: lives alone; his children come over daily.  Lives in: House/apartment Stairs: Yes: External: 3 steps; bilateral but cannot reach both; I don't use the front stairs, because there are no railing on that one. Has following equipment at home: Quad cane small base  OCCUPATION: n/a   PLOF: Independent, Independent with household mobility without device, and Independent with community mobility without device  PATIENT GOALS: To improve strength and endurance if possible.   NEXT MD VISIT: 04/05/2024 with PCP   OBJECTIVE:  Note: Objective measures were completed at Evaluation unless otherwise noted.  DIAGNOSTIC FINDINGS:   No relevant imaging results available in epic; none included in referral    PATIENT SURVEYS:   THE PATIENT SPECIFIC FUNCTIONAL SCALE  Place score of 0-10 (0 = unable to perform activity and 10 = able to perform activity at the same level as before injury or problem)  Activity Date: 03/30/24    Walk (>20 min) 8  2.Heavy Lifting (ie. Top soil)  5    3. Stairs 5    4.      Total Score 6      Total Score = Sum of activity scores/number of activities  Minimally Detectable Change: 3 points (for single activity); 2 points (for average score)  [facility] (nd). The Patient Specific Functional Scale . Retrieved from [email or web address]    COGNITION: Overall cognitive status: Within functional limits for tasks assessed      LOWER EXTREMITY MMT:  MMT Right eval Left eval  Hip flexion 4+ 4+  Hip extension    Hip abduction 4+ 4+  Hip adduction 4 4  Hip internal rotation    Hip external rotation    Knee flexion 5 5  Knee extension    Ankle dorsiflexion    Ankle plantarflexion    Ankle inversion    Ankle eversion     (Blank rows = not tested)   FUNCTIONAL TESTS:   2 minute walk test: 255 ft; use of quad cane for initial 30 seconds of test.   GAIT: Distance walked: 255 ft (see above) Assistive device utilized: Quad cane small base Level of assistance: Complete Independence and Modified independence Comments: ambulates with R>L stance time, steady gait speed                                                                                                                                TREATMENT DATE:  [facility] Adult PT Treatment:                                                DATE: 04/06/24 Therapeutic Exercise: Nustep level 3 x 8 mins to address aerobic capacity Heel raises 2x10 Seated hip adduction 5 hold 2x10 Cybex hip abd/ext/flex 17.5# x10 ea BIL Therapeutic Activity: Standing marching 3x30 single UE support Step ups 6 fwd/lat x10 ea BIL STS from chair holding 5# KB x10 - cues for set up and form, controlling descent (next session with weighted ball to [facility] press to promote upright posture) Rows standing on Airex GTB 2x10   [facility] Adult PT Treatment:                                                DATE: 04/04/24 Therapeutic Exercise: Nustep level 3 x 8 mins to address aerobic capacity Heel raises 2x10 Standing hip abduction/extension x10 ea BIL Seated hip adduction 5 hold 2x10 Therapeutic Activity: Standing marching 3x30 single UE support Step ups 4 fwd/lat x10 ea BIL STS from low chair 2x10 arms crossed (holding 5# KB next session?) Gait 2 laps x185' without cane   [facility] Adult PT  Treatment:                                                DATE: 03/30/2024   Initial evaluation: see patient education and home exercise program as noted below      PATIENT EDUCATION:  Education details: reviewed initial home exercise program; discussion of POC, prognosis and goals for skilled PT   Person educated: Patient Education method: Explanation, Demonstration, and Handouts Education comprehension: verbalized understanding, returned  demonstration, and needs further education  HOME EXERCISE PROGRAM: Access Code: [email or web address] URL: [email or web address] Date: 04/04/2024 Prepared by: Corean Pouch  Exercises - Heel Raises with Counter Support  - 1 x daily - 7 x weekly - 2 sets - 10 reps - Standing Marching  - 1 x daily - 7 x weekly - 2 sets - 10 reps - Sit to Stand  - 1 x daily - 7 x weekly - 2 sets - 10 reps   ASSESSMENT:  CLINICAL IMPRESSION: Patient presents to PT reporting some soreness and stiffness in his lower back from yard work yesterday. Session today continued to focus on LE strengthening and improving standing activity tolerance with increased difficulty today as tolerated. He reports muscular fatigue at end of session, but no pain or soreness. Patient continues to benefit from skilled PT services and should be progressed as able to improve functional independence.   EVAL: Jaydeen is a 78 y.o. male who was seen today for physical therapy evaluation and treatment for BIL LE weakness following recent hospitalization. He is demonstrating decreased LE MMT BIL, decreased gait speed, and intermittent use of cane with ambulation. He has related pain and difficulty with long periods of walking, heavy lifting as related to household and yardwork responsibilities, and navigating stiars. He requires skilled PT services at this time to address relevant deficits and improve overall function.     OBJECTIVE IMPAIRMENTS: Abnormal gait, decreased endurance, and decreased strength.   ACTIVITY LIMITATIONS: carrying, lifting, standing, squatting, and stairs  PARTICIPATION LIMITATIONS: interpersonal relationship, community activity, and yard work  PERSONAL FACTORS: Age, Past/current experiences, Time since onset of injury/illness/exacerbation, and 3+ comorbidities: Relevant PMHx includes arthritis, CAD, DM type II, DKA, HTN, LE neuropathy, R THA (2020), Anemia, Depression are also affecting patient's functional  outcome.   REHAB POTENTIAL: Fair    CLINICAL DECISION MAKING: Evolving/moderate complexity  EVALUATION COMPLEXITY: Moderate   GOALS: Goals reviewed with patient? YES  SHORT TERM GOALS: Target date: 04/27/2024   Patient will be independent with initial home program at least 3 days/week.  Baseline: provided at eval Goal Status: INITIAL   2.  Patient will demonstrate at least 4+/5 MMT hip adduction strength BIL.  Baseline: see objective measures Goal Status: INITIAL     LONG TERM GOALS: Target date: 05/25/2024   Patient will report improved overall functional ability with PSFS score of 8 or greater.  Baseline: 6 Goal Status: INITIAL    2.  Patient will demonstrate ability to safely ascend/descend 8 step height at least 20 times.  Baseline: limited d/t mm fatigue  Goal status: INITIAL  3.  Patient will demonstrate ability to perform floor to waist lifting of at least 25# x 10 repetitions, using appropriate body mechanics and with no more than minimal pain in order to safely perform normal daily/occupational tasks.   Baseline:   Goal Status: INITIAL   4.  Patient will ambulate 300 ft or more during , in order to demonstrate diminished fall risk and improved community ambulation.  Baseline: 215ft (MDC: 40 ft)  Goal status: INITIAL     PLAN:  PT FREQUENCY: 1-2x/week  PT DURATION: 8 weeks  PLANNED INTERVENTIONS: 02835- PT Re-evaluation, 97750- Physical Performance Testing, 97110-Therapeutic exercises, 97530- Therapeutic activity, 97112- Neuromuscular re-education, 97535- Self Care, 02859- Manual therapy, [id number]- Aquatic Therapy, Patient/Family education, Balance training, Stair training, Cryotherapy, and Moist heat  PLAN FOR NEXT SESSION: LE strengthening program, use of nustep to address aerobic capacity, further functional tests as indicated for static/dynamic balance and address related deficits as indicated   Date of referral: 03/10/2024 Referring provider:   [name], DO    Referring diagnosis? R26.89 (ICD-10-CM) - Other abnormalities of gait and mobility  Treatment diagnosis? (if different than referring diagnosis)  Muscle weakness (generalized) Difficulty in walking, not elsewhere classified  What was this (referring dx) caused by? Ongoing Issue  Lysle of Condition: Chronic (continuous duration > 3 months)   Laterality: Both  Current Functional Measure Score: Patient Specific Functional Scale 6/10  Objective measurements identify impairments when they are compared to normal values, the uninvolved extremity, and prior level of function.  [x]  Yes  []  No  Objective assessment of functional ability: Moderate functional limitations   Briefly describe symptoms: BIL LE weakness, decreased endurance with daily activities  How did symptoms start: gradual onset, without known MOI. Patient has hx of R THA  Average pain intensity:  Last 24 hours: 0/10  Past week: 0/10  How often does the pt experience symptoms? Frequently  How much have the symptoms interfered with usual daily activities? Moderately  How has condition changed since care began at this facility? NA - initial visit  In general, how is the patients overall health? Fair   BACK PAIN (STarT Back Screening Tool) No   Corean Pouch PTA  04/06/2024 9:57 AM

## 2024-04-05 NOTE — Assessment & Plan Note (Signed)
 Last A1c 7.1%. Insulin  administration remains inconsistent and he is understandably fearful of hypoglycemia. He believes he is experiencing lows however his CGM reports no lows, he questions accuracy however is finger sticking to verify. Will increase insulin  to 40 units BID. Discussed verifying abnormal readings with glucose monitor prior to treating. Will start with 35 units BID insulin  today and increase by 1 units daily until FBG readings are <130. Referral to pharmacy for assistance managing and will consider switching to Tresiba or alternative with less hypoglycemia potential. Follow up in 2 months, keep appt with pharmD

## 2024-04-05 NOTE — Telephone Encounter (Signed)
 Prior Authorization form/request asks a question that requires your assistance. Please see the question below and advise accordingly. The PA will not be submitted until the necessary information is received.   I need ICD 10 code for Michael Guzman glycopyrrolate  2mg  please.  Thanks

## 2024-04-05 NOTE — Progress Notes (Addendum)
 Subjective:  HPI: Michael Guzman is a 78 y.o. male presenting on 04/05/2024 for Follow-up (2 week follow up on diabetes. Pt states he has been having issues with insulin  pens.)   HPI Patient is in today for follow up for diabetes. Since his last visit with me he has been doing well using his CGM. Libre readings are 47% in range 70-180, 41% 181-250, 11% >250. GMI 7.8%.   DIABETES Hypoglycemic episodes:no Polydipsia/polyuria: no Visual disturbance: no Chest pain: no Paresthesias: no Glucose Monitoring: yes  Accucheck frequency: CGM  Fasting glucose:  Post prandial:  Evening:  Before meals: Taking Insulin ?: yes  Long acting insulin :   Short acting insulin : 70/30 35 units BID Blood Pressure Monitoring: not checking Retinal Examination: Not up to Date Foot Exam: Up to Date Diabetic Education: Completed Pneumovax: Up to Date Influenza: Up to Date Aspirin : yes   Review of Systems  All other systems reviewed and are negative.   Relevant past medical history reviewed and updated as indicated.   Past Medical History:  Diagnosis Date   Arthritis    CAD (coronary artery disease)    a.s/p DES to mid LAD and OM2 06/2012.   Chronic back pain    Chronically dry eyes    Diabetes mellitus without complication (HCC)    TYPE 2    DKA (diabetic ketoacidoses) 01/30/2017   Elevated PSA    being monitored   Fatty liver    GERD (gastroesophageal reflux disease)    Hx of radiation therapy    prostate , [facility]    Hypertension    Hypertriglyceridemia    Intermediate coronary syndrome (HCC) 07/26/2012   Nausea vomiting and diarrhea 11/11/2023   Neuromuscular disorder (HCC)    neuropathy feet and legs   Prostate cancer (HCC)    RADTIATION      Past Surgical History:  Procedure Laterality Date   APPENDECTOMY     CARDIAC SURGERY  ~2017   3 stent placed.   COLONOSCOPY  02/12/2010   Buccini   COLONOSCOPY WITH PROPOFOL  N/A 09/21/2022   Procedure: COLONOSCOPY  WITH PROPOFOL ;  Surgeon: Mansouraty, Aloha Raddle., MD;  Location: [facility];  Service: Gastroenterology;  Laterality: N/A;   ENDOSCOPIC MUCOSAL RESECTION N/A 09/21/2022   Procedure: ENDOSCOPIC MUCOSAL RESECTION;  Surgeon: Wilhelmenia Aloha Raddle., MD;  Location: [facility];  Service: Gastroenterology;  Laterality: N/A;   HEMOSTASIS CLIP PLACEMENT  09/21/2022   Procedure: HEMOSTASIS CLIP PLACEMENT;  Surgeon: Wilhelmenia Aloha Raddle., MD;  Location: [facility];  Service: Gastroenterology;;   LEFT HEART CATHETERIZATION WITH CORONARY ANGIOGRAM N/A 06/27/2012   Procedure: LEFT HEART CATHETERIZATION WITH CORONARY ANGIOGRAM;  Surgeon: Oneil Parchment, MD;  Location: [facility] C[facility] LAB;  Service: Cardiovascular;  Laterality: N/A;   LEFT HEART CATHETERIZATION WITH CORONARY ANGIOGRAM N/A 07/25/2012   Procedure: LEFT HEART CATHETERIZATION WITH CORONARY ANGIOGRAM;  Surgeon: Wilbert JONELLE Bihari, MD;  Location: [facility] CATH LAB;  Service: Cardiovascular;  Laterality: N/A;   PROSTATE BIOPSY     RADIOLOGY WITH ANESTHESIA N/A 12/03/2015   Procedure: MRI LUMBAR SPINE;  Surgeon: Medication Radiologist, MD;  Location: [facility];  Service: Radiology;  Laterality: N/A;   SUBMUCOSAL LIFTING INJECTION  09/21/2022   Procedure: SUBMUCOSAL LIFTING INJECTION;  Surgeon: Wilhelmenia Aloha Raddle., MD;  Location: [facility];  Service: Gastroenterology;;   TOTAL HIP ARTHROPLASTY Right 04/28/2019   Procedure: TOTAL HIP ARTHROPLASTY ANTERIOR APPROACH;  Surgeon: Yvone Rush, MD;  Location: [facility];  Service: Orthopedics;  Laterality: Right;    Allergies and  medications reviewed and updated.   Current Outpatient Medications:    Ascorbic Acid (VITAMIN C) 1000 MG tablet, Take 1,000 mg by mouth in the morning., Disp: , Rfl:    aspirin  EC 81 MG tablet, Take 1 tablet (81 mg total) by mouth daily., Disp: 90 tablet, Rfl: 3   atorvastatin  (LIPITOR ) 10 MG tablet, TAKE 1 TABLET BY MOUTH DAILY, Disp: 100 tablet, Rfl: 2   B Complex-C (SUPER B COMPLEX PO), Take 1  tablet by mouth in the morning., Disp: , Rfl:    Blood Glucose Monitoring Suppl (ONE TOUCH ULTRA 2) w/Device KIT, Use as directed, Disp: 1 kit, Rfl: 0   Cholecalciferol  (VITAMIN D3) 250 MCG (10000 UT) capsule, Take 10,000 Units by mouth in the morning., Disp: , Rfl:    Continuous Glucose Sensor (FREESTYLE LIBRE 3 PLUS SENSOR) MISC, Use to monitor blood sugar continuously. Change sensor every 15 days., Disp: 2 each, Rfl: 6   gabapentin  (NEURONTIN ) 800 MG tablet, TAKE 1 TABLET BY MOUTH 3 TO 4 TIMES DAILY FOR PAIN, HOT FLASHES AND SWEATS, Disp: 400 tablet, Rfl: 2   glucose blood (ONETOUCH ULTRA) test strip, CHECK BLOOD SUGAR 3 TIMES  DAILY, Disp: 300 strip, Rfl: 3   Insulin  Pen Needle (NOVOFINE) 30G X 8 MM MISC, Inject 10 each into the skin as needed., Disp: 100 each, Rfl: 0   Insulin  Syringes, Disposable, U-100 1 ML MISC, 50 Units by Does not apply route 2 (two) times daily. inject 50 units into skin 2 x /day, Disp: 200 each, Rfl: 3   isosorbide  mononitrate (IMDUR ) 30 MG 24 hr tablet, Take 1 tablet (30 mg total) by mouth daily., Disp: 90 tablet, Rfl: 0   Lancets (ONETOUCH DELICA PLUS LANCET33G) MISC, CHECK BLOOD SUGAR 3 TIMES  DAILY, Disp: 300 each, Rfl: 3   latanoprost  (XALATAN ) 0.005 % ophthalmic solution, Place 1 drop into both eyes at bedtime., Disp: , Rfl:    Magnesium  500 MG TABS, Take 500 mg by mouth in the morning., Disp: , Rfl:    metFORMIN  (GLUCOPHAGE -XR) 500 MG 24 hr tablet, TAKE 2 TABLETS BY MOUTH TWICE  DAILY WITH MEALS FOR DIABETES, Disp: 400 tablet, Rfl: 2   metoprolol  succinate (TOPROL -XL) 25 MG 24 hr tablet, TAKE ONE-HALF TABLET BY MOUTH  DAILY, Disp: 45 tablet, Rfl: 3   montelukast  (SINGULAIR ) 10 MG tablet, TAKE 1 TABLET BY MOUTH DAILY FOR ALLERGIES, Disp: 100 tablet, Rfl: 2   nitroGLYCERIN  (NITROSTAT ) 0.4 MG SL tablet, DISSOLVE 1 TABLET UNDER THE  TONGUE EVERY 5 MINUTES AS NEEDED FOR CHEST PAIN. MAX OF 3 TABLETS IN 15 MINUTES. CALL 911 IF PAIN  PERSISTS., Disp: 125 tablet, Rfl: 2    pantoprazole  (PROTONIX ) 40 MG tablet, TAKE 1 TABLET BY MOUTH DAILY TO  PREVENT HEARTBURN AND  INDIGESTION, Disp: 100 tablet, Rfl: 2   sodium chloride  1 g tablet, Take 1 tablet (1 g total) by mouth 2 (two) times daily with a meal., Disp: 60 tablet, Rfl: 0   tamsulosin  (FLOMAX ) 0.4 MG CAPS capsule, Take 0.4 mg by mouth at bedtime., Disp: , Rfl:    Vitamin E 450 MG (1000 UT) CAPS, Take 450 mg by mouth in the morning., Disp: , Rfl:    zinc gluconate 50 MG tablet, Take 50 mg by mouth in the morning., Disp: , Rfl:    glycopyrrolate  (ROBINUL ) 2 MG tablet, Take  1 tablet  3 x / day for Excessive Sweating  TAKE ONE TABLET BY  MOUTH, Disp: 270 tablet, Rfl: 3   insulin  NPH-regular Human (HUMULIN  70/30) (70-30) 100 UNIT/ML injection, INJECT SUBCUTANEOUSLY 40  UNITS TWICE DAILY, Disp: , Rfl:   No Known Allergies  Objective:   BP 118/80   Pulse 86   Temp (!) 97.5 F (36.4 C)   Ht 5' 10 (1.778 m)   Wt 200 lb 3.2 oz (90.8 kg)   SpO2 97%   BMI 28.73 kg/m      04/05/2024   10:16 AM 03/15/2024   10:13 AM 03/10/2024    7:19 AM  Vitals with BMI  Height 5' 10 5' 10   Weight 200 lbs 3 oz 185 lbs 3 oz   BMI 28.73 26.57   Systolic 118 120 869  Diastolic 80 72 74  Pulse 86 60 62     Physical Exam Vitals and nursing note reviewed.  Constitutional:      Appearance: Normal appearance. He is normal weight.  HENT:     Head: Normocephalic and atraumatic.  Cardiovascular:     Rate and Rhythm: Normal rate and regular rhythm.     Pulses: Normal pulses.     Heart sounds: Normal heart sounds.  Pulmonary:     Effort: Pulmonary effort is normal.     Breath sounds: Normal breath sounds.  Skin:    General: Skin is warm and dry.     Capillary Refill: Capillary refill takes less than 2 seconds.  Neurological:     General: No focal deficit present.     Mental Status: He is alert and oriented to person, place, and time. Mental status is at baseline.   Psychiatric:        Mood and Affect: Mood normal.        Behavior: Behavior normal.        Thought Content: Thought content normal.        Judgment: Judgment normal.     Assessment & Plan:  Type 2 diabetes mellitus with vascular disease (HCC) Assessment & Plan: Last A1c 7.1%. Insulin  administration remains inconsistent and he is understandably fearful of hypoglycemia. He believes he is experiencing lows however his CGM reports no lows, he questions accuracy however is finger sticking to verify. Will increase insulin  to 40 units BID. Discussed verifying abnormal readings with glucose monitor prior to treating. Will start with 35 units BID insulin  today and increase by 1 units daily until FBG readings are <130. Referral to pharmacy for assistance managing and will consider switching to Tresiba or alternative with less hypoglycemia potential. Follow up in 2 months, keep appt with pharmD  Orders: -     Comprehensive metabolic panel with GFR; Future -     Hemoglobin A1c; Future  Insulin -requiring or dependent type II diabetes mellitus (HCC) -     HumuLIN  70/30; INJECT SUBCUTANEOUSLY 40  UNITS TWICE DAILY  Other orders -     Glycopyrrolate ; Take  1 tablet  3 x / day for Excessive Sweating  TAKE ONE TABLET BY                                                 MOUTH  Dispense: 270 tablet; Refill: 3     Follow up plan: Return in about 2 months (around 06/05/2024) for chronic follow-up with labs 1 week prior.  Jeoffrey GORMAN Barrio, [name]

## 2024-04-06 ENCOUNTER — Other Ambulatory Visit: Payer: Self-pay | Admitting: *Deleted

## 2024-04-06 ENCOUNTER — Ambulatory Visit

## 2024-04-06 ENCOUNTER — Other Ambulatory Visit (HOSPITAL_COMMUNITY): Payer: Self-pay

## 2024-04-06 DIAGNOSIS — M6281 Muscle weakness (generalized): Secondary | ICD-10-CM | POA: Diagnosis not present

## 2024-04-06 DIAGNOSIS — R262 Difficulty in walking, not elsewhere classified: Secondary | ICD-10-CM

## 2024-04-06 NOTE — Patient Outreach (Signed)
 Transition of Care week 4  Visit Note  04/06/2024  Name: Michael Guzman MRN: [id number]          DOB: 04-Aug-1946  Situation: Patient enrolled in [facility] 30-day program. Visit completed with patient by telephone.   Background:    Past Medical History:  Diagnosis Date   Arthritis    CAD (coronary artery disease)    a.s/p DES to mid LAD and OM2 06/2012.   Chronic back pain    Chronically dry eyes    Diabetes mellitus without complication (HCC)    TYPE 2    DKA (diabetic ketoacidoses) 01/30/2017   Elevated PSA    being monitored   Fatty liver    GERD (gastroesophageal reflux disease)    Hx of radiation therapy    prostate , [facility]    Hypertension    Hypertriglyceridemia    Intermediate coronary syndrome (HCC) 07/26/2012   Nausea vomiting and diarrhea 11/11/2023   Neuromuscular disorder (HCC)    neuropathy feet and legs   Prostate cancer (HCC)    RADTIATION     Assessment: Patient Reported Symptoms: Cognitive Cognitive Status: No symptoms reported, Alert and oriented to person, place, and time, Normal speech and language skills      Neurological Neurological Review of Symptoms: No symptoms reported    HEENT HEENT Symptoms Reported: No symptoms reported      Cardiovascular Cardiovascular Symptoms Reported: No symptoms reported Does patient have uncontrolled Hypertension?: No Cardiovascular Management Strategies: Medication therapy, Routine screening Cardiovascular Self-Management Outcome: 4 (good)  Respiratory Respiratory Symptoms Reported: No symptoms reported    Endocrine Endocrine Symptoms Reported: No symptoms reported Is patient diabetic?: Yes Is patient checking blood sugars at home?: Yes List most recent blood sugar readings, include date and time of day: FBS today 140, pt using CGM monitor, feels this is working well Endocrine Self-Management Outcome: 4 (good) Endocrine Comment: Reviewed carbohydrate modified diet  Gastrointestinal  Gastrointestinal Symptoms Reported: No symptoms reported      Genitourinary Genitourinary Symptoms Reported: No symptoms reported    Integumentary Integumentary Symptoms Reported: No symptoms reported    Musculoskeletal Musculoskelatal Symptoms Reviewed: Limited mobility Additional Musculoskeletal Details: uses cane when leaves home, is now participating in outpatient PT Musculoskeletal Management Strategies: Adequate rest, Routine screening, Exercise Musculoskeletal Self-Management Outcome: 4 (good)      Psychosocial Psychosocial Symptoms Reported: No symptoms reported         There were no vitals filed for this visit.  Medications Reviewed Today     Reviewed by Aura Mliss LABOR, RN (Registered Nurse) on 04/06/24 at 1421  Med List Status: <None>   Medication Order Taking? Sig Documenting Provider Last Dose Status Informant  Ascorbic Acid (VITAMIN C) 1000 MG tablet [id number]  Take 1,000 mg by mouth in the morning. [provider]  Active Self, Pharmacy Records  aspirin  EC 81 MG tablet [id number]  Take 1 tablet (81 mg total) by mouth daily. [name], MD  Active Self, Pharmacy Records           Med Note [name] Sep 17, 2022  2:44 PM)    atorvastatin  (LIPITOR ) 10 MG tablet [id number]  TAKE 1 TABLET BY MOUTH DAILY [name], [name], NP  Active Self, Pharmacy Records  B Complex-C (SUPER B COMPLEX PO) [id number]  Take 1 tablet by mouth in the morning. [provider]  Active Self, Pharmacy Records  Blood Glucose Monitoring Suppl (ONE TOUCH ULTRA 2) w/Device KIT [id number]  Use as directed [name], [name], NP  Active Self, Pharmacy Records  Cholecalciferol  (VITAMIN D3) 250 MCG (10000 UT) capsule [id number]  Take 10,000 Units by mouth in the morning. [provider]  Active Self, Pharmacy Records  Continuous Glucose Sensor (FREESTYLE LIBRE 3 PLUS SENSOR) [place] [id number]  Use to monitor blood sugar continuously. Change sensor every 15 days. Kayla Gauze S, [name]  Active   gabapentin  (NEURONTIN ) 800 MG tablet [id number]  TAKE 1 TABLET BY MOUTH 3 TO 4 TIMES DAILY FOR PAIN, HOT FLASHES AND SWEATS Kayla Gauze RAMAN, [name]  Active Self, Pharmacy Records  glucose blood [facility] ULTRA) test strip [id number]  CHECK BLOOD SUGAR 3 TIMES  DAILY Kayla Gauze RAMAN, [name]  Active Self, Pharmacy Records  glycopyrrolate  (ROBINUL ) 2 MG tablet [id number]  Take  1 tablet  3 x / day for Excessive Sweating  TAKE ONE TABLET BY                                                 MOUTH Kayla Gauze RAMAN, [name]  Active   insulin  NPH-regular Human (HUMULIN  70/30) (70-30) 100 UNIT/ML injection [id number]  INJECT SUBCUTANEOUSLY 40  UNITS TWICE DAILY Kayla Gauze RAMAN, [place]  Active   Insulin  Pen Needle (NOVOFINE) 30G X 8 MM MISC [id number]  Inject 10 each into the skin as needed. Kayla Gauze RAMAN, [name]  Active   Insulin  Syringes, Disposable, U-100 1 ML MISC [id number]  50 Units by Does not apply route 2 (two) times daily. inject 50 units into skin 2 x /day [name], MD  Active Self, Pharmacy Records  isosorbide  mononitrate (IMDUR ) 30 MG 24 hr tablet [id number]  Take 1 tablet (30 mg total) by mouth daily. Kayla Gauze RAMAN, [name]  Active Self, Pharmacy Records  Lancets [facility] CATHRY[place] PLUS [place]) MISC [id number]  CHECK BLOOD SUGAR 3 TIMES  DAILY [name], MD  Active Self, Pharmacy Records  latanoprost  (XALATAN ) 0.005 % ophthalmic solution [id number]  Place 1 drop into both eyes at bedtime. [provider]  Active Self, Pharmacy Records  Magnesium  500 MG TABS [id number]  Take 500 mg by mouth in the morning. [provider]  Active Self, Pharmacy Records  metFORMIN  (GLUCOPHAGE -XR) 500 MG 24 hr tablet [id number]  TAKE 2 TABLETS BY MOUTH TWICE  DAILY WITH MEALS FOR DIABETES [name], [name], NP  Active Self, Pharmacy Records  metoprolol  succinate (TOPROL -XL) 25 MG 24 hr tablet [id number]  TAKE ONE-HALF TABLET BY MOUTH  DAILY [name], MD  Active Self, Pharmacy Records   montelukast  (SINGULAIR ) 10 MG tablet [id number]  TAKE 1 TABLET BY MOUTH DAILY FOR ALLERGIES Kayla Gauze RAMAN, [name]  Active   nitroGLYCERIN  (NITROSTAT ) 0.4 MG SL tablet [id number]  DISSOLVE 1 TABLET UNDER THE  TONGUE EVERY 5 MINUTES AS NEEDED FOR CHEST PAIN. MAX OF 3 TABLETS IN 15 MINUTES. CALL 911 IF PAIN  PERSISTS. Kayla Gauze RAMAN, [name]  Active Self, Pharmacy Records  pantoprazole  (PROTONIX ) 40 MG tablet [id number]  TAKE 1 TABLET BY MOUTH DAILY TO  PREVENT HEARTBURN AND  INDIGESTION Kayla Gauze RAMAN, [name]  Active   sodium chloride  1 g tablet [id number]  Take 1 tablet (1 g total) by mouth 2 (two) times daily with a meal. [name], DO  Active   tamsulosin  (FLOMAX ) 0.4 MG CAPS capsule [id number]  Take 0.4 mg by mouth  at bedtime. [provider]  Active Self, Pharmacy Records  Vitamin E 450 MG (1000 UT) CAPS [id number]  Take 450 mg by mouth in the morning. [provider]  Active Self, Pharmacy Records  zinc gluconate 50 MG tablet [id number]  Take 50 mg by mouth in the morning. [provider]  Active Self, Pharmacy Records            Recommendation:   PCP Follow-up  Follow Up Plan:   Closing From:  Transitions of Care Program  Mliss Creed [facility], BSN RN Care Manager/ Transition of Care [place]/ [facility] [phone]

## 2024-04-06 NOTE — Telephone Encounter (Signed)
 Pharmacy Patient Advocate Encounter   Received notification from CoverMyMeds that prior authorization for Glycopyrrolate  2MG  tablets is required/requested.   Insurance verification completed.   The patient is insured through [facility] .   Per test claim: PA required; PA submitted to above mentioned insurance via CoverMyMeds Key/confirmation #/EOC [id number] Status is pending

## 2024-04-06 NOTE — Patient Instructions (Signed)
 Visit Information  Thank you for taking time to visit with me today. Please don't hesitate to contact me if I can be of assistance to you before our next scheduled telephone appointment.  Our next appointment is no further scheduled appointments.    Following is a copy of your care plan:   Goals Addressed             This Visit's Progress    COMPLETED: [facility] Transitions of Care (TOC) Care Plan       Problems:  Recent Hospitalization for treatment of Hyponatremia No Hospital Follow Up Provider appointment - RN Care Manager collaborated with care guide, scheduled post hospital follow up for 03/21/24 @ 1015 am Patient states [facility] sent him insulin  pens and pt told them not to send him any needles for the pens because he had vials of insulin  left and was using the vials, pt has one vial left and has insulin  pens on hands but no needles, pt states he is calling [facility] today to have them send the needles for the pens, pt states if he has any issues with this process he will contact RN CM for assistance. 03/21/24-pt reports he saw primary care provider and now has CGM for monitoring blood sugar, pt states  I don't know the name of this, I'm going back for them to change everything and make sure it's working okay,  FBS this morning 74, pt states he still has Humulin  70/30 in vials, states [facility] never sent needles for pens because they had sent previously, pt does not know if he lost them but states he is tired of dealing with [facility] and will not be talking to them again,  RN Care Manager suggested referral to pharmacist for management/ reinforcement/ teaching of new CGM and making sure pt has insulin , needles for pens on hand, etc.  Pt adamantly refuses referral for pharmacist, states he does not want anything else added to make more confusion,  pt feels he is doing well managing CGM and has Humulin  70/30 insulin  vials on hand. 03/29/24- pt reports doing very well, no concerns reported, no issues with  CGM, FBS today 150 and alarms for readings >250, pt states he has had no low readings, pt states he has insulin , pens and all needed supplies. 04/06/24- pt states he is attending outpatient PT and this is working well, no new concerns reported, FBS today 140, has all medications and taking as prescribed  Goal:  Over the next 30 days, the patient will not experience hospital readmission  Interventions:  Evaluation of current treatment plan related to Hyponatremia, Limited education about diabetes, carbohydrate modified diet* self-management and patient's adherence to plan as established by provider. Discussed plans with patient for ongoing care management follow up and provided patient with direct contact information for care management team Evaluation of current treatment plan related to Hyponatremia, diabetes and patient's adherence to plan as established by provider Reviewed in depth carbohydrate modified diet, plate method, gave examples of foods high in carbohydrates to limit/ avoid Reinforced safety precautions, importance of using cane as needed Reviewed signs/ symptoms hyponatremia, being mindful if sweats a lot on hot days to take Rubinol as prescribed Reviewed upcoming scheduled appointments  Reviewed plan of care including case closure today, pt declines further outreach, cites no further needs  Patient Self Care Activities:  Attend all scheduled provider appointments Attend church or other social activities Call pharmacy for medication refills 3-7 days in advance of running out of medications  Call provider office for new concerns or questions  Notify RN Care Manager of TOC call rescheduling needs Participate in Transition of Care Program/Attend TOC scheduled calls Take medications as prescribed   check blood sugar at prescribed times: per doctor's order/ recommendation enter blood sugar readings and medication or insulin  into daily log take the blood sugar log to all doctor  visits fill half of plate with vegetables manage portion size set a realistic goal Case closure  Plan:  No further follow up required: case closure        Patient verbalizes understanding of instructions and care plan provided today and agrees to view in MyChart. Active MyChart status and patient understanding of how to access instructions and care plan via MyChart confirmed with patient.     Telephone follow up appointment with care management team member scheduled for:  Please call the care guide team at [phone] if you need to cancel or reschedule your appointment.   Please call the Suicide and Crisis Lifeline: 988 call the [place]  National Suicide Prevention Lifeline: 310-410-5667 or TTY: 5083023748 TTY 620-874-7272) to talk to a trained counselor call 1-800-273-TALK (toll free, 24 hour hotline) go to [facility] [address] [place] [phone]) call 911 if you are experiencing a Mental Health or Behavioral Health Crisis or need someone to talk to.  Mliss Creed [facility], BSN RN Care Manager/ Transition of Care [place]/ [facility] [phone]

## 2024-04-06 NOTE — Telephone Encounter (Signed)
Thanks so much [place]!

## 2024-04-07 ENCOUNTER — Other Ambulatory Visit (HOSPITAL_COMMUNITY): Payer: Self-pay

## 2024-04-07 ENCOUNTER — Telehealth: Payer: Self-pay

## 2024-04-07 NOTE — Telephone Encounter (Signed)
 Copied from CRM [id number]. Topic: Clinical - Prescription Issue >> Apr 07, 2024  1:07 PM Emylou G wrote: Reason for CRM: [facility] called.. had more medical questions in regards to script for Glycopyrrolate  2MG  tablets - call back number [phone] ref: [id number] ETTER Ards ) Need an answer before 7/27

## 2024-04-07 NOTE — Telephone Encounter (Signed)
 Pharmacy Patient Advocate Encounter  Received notification from [facility] that Prior Authorization for Glycopyrrolate  2MG  tablets has been DENIED.  Full denial letter will be uploaded to the media tab. See denial reason below.       PA #/Case ID/Reference #: [id number]

## 2024-04-11 ENCOUNTER — Ambulatory Visit

## 2024-04-11 DIAGNOSIS — M6281 Muscle weakness (generalized): Secondary | ICD-10-CM

## 2024-04-11 DIAGNOSIS — R262 Difficulty in walking, not elsewhere classified: Secondary | ICD-10-CM

## 2024-04-11 NOTE — Therapy (Signed)
 OUTPATIENT PHYSICAL THERAPY TREATMENT NOTE   Patient Name: Michael Guzman MRN: [id number] DOB:12-08-1945, 78 y.o., male Today's Date: 04/11/2024  END OF SESSION:  PT End of Session - 04/11/24 1052     Visit Number 4    Number of Visits 16    Date for PT Re-Evaluation 05/25/24    Authorization Type [employer] MCR    Authorization Time Period Submitted for 12 visits    Authorization - Visit Number 3    Authorization - Number of Visits 12    PT Start Time 1047    PT Stop Time 1125    PT Time Calculation (min) 38 min    Activity Tolerance Patient tolerated treatment well    Behavior During Therapy WFL for tasks assessed/performed           Past Medical History:  Diagnosis Date   Arthritis    CAD (coronary artery disease)    a.s/p DES to mid LAD and OM2 06/2012.   Chronic back pain    Chronically dry eyes    Diabetes mellitus without complication (HCC)    TYPE 2    DKA (diabetic ketoacidoses) 01/30/2017   Elevated PSA    being monitored   Fatty liver    GERD (gastroesophageal reflux disease)    Hx of radiation therapy    prostate , [facility]    Hypertension    Hypertriglyceridemia    Intermediate coronary syndrome (HCC) 07/26/2012   Nausea vomiting and diarrhea 11/11/2023   Neuromuscular disorder (HCC)    neuropathy feet and legs   Prostate cancer (HCC)    RADTIATION    Past Surgical History:  Procedure Laterality Date   APPENDECTOMY     CARDIAC SURGERY  ~2017   3 stent placed.   COLONOSCOPY  02/12/2010   Buccini   COLONOSCOPY WITH PROPOFOL  N/A 09/21/2022   Procedure: COLONOSCOPY WITH PROPOFOL ;  Surgeon: Mansouraty, Aloha Raddle., MD;  Location: [facility];  Service: Gastroenterology;  Laterality: N/A;   ENDOSCOPIC MUCOSAL RESECTION N/A 09/21/2022   Procedure: ENDOSCOPIC MUCOSAL RESECTION;  Surgeon: Wilhelmenia Aloha Raddle., MD;  Location: [facility];  Service: Gastroenterology;  Laterality: N/A;   HEMOSTASIS CLIP PLACEMENT  09/21/2022   Procedure:  HEMOSTASIS CLIP PLACEMENT;  Surgeon: Wilhelmenia Aloha Raddle., MD;  Location: [facility];  Service: Gastroenterology;;   LEFT HEART CATHETERIZATION WITH CORONARY ANGIOGRAM N/A 06/27/2012   Procedure: LEFT HEART CATHETERIZATION WITH CORONARY ANGIOGRAM;  Surgeon: Oneil Parchment, MD;  Location: [facility];  Service: Cardiovascular;  Laterality: N/A;   LEFT HEART CATHETERIZATION WITH CORONARY ANGIOGRAM N/A 07/25/2012   Procedure: LEFT HEART CATHETERIZATION WITH CORONARY ANGIOGRAM;  Surgeon: Wilbert JONELLE Bihari, MD;  Location: [facility];  Service: Cardiovascular;  Laterality: N/A;   PROSTATE BIOPSY     RADIOLOGY WITH ANESTHESIA N/A 12/03/2015   Procedure: MRI LUMBAR SPINE;  Surgeon: Medication Radiologist, MD;  Location: [facility];  Service: Radiology;  Laterality: N/A;   SUBMUCOSAL LIFTING INJECTION  09/21/2022   Procedure: SUBMUCOSAL LIFTING INJECTION;  Surgeon: Wilhelmenia Aloha Raddle., MD;  Location: [facility];  Service: Gastroenterology;;   TOTAL HIP ARTHROPLASTY Right 04/28/2019   Procedure: TOTAL HIP ARTHROPLASTY ANTERIOR APPROACH;  Surgeon: Yvone Rush, MD;  Location: [facility];  Service: Orthopedics;  Laterality: Right;   Patient Active Problem List   Diagnosis Date Noted   Anemia 02/28/2024   Encounter for Medicare annual wellness exam 12/09/2023   Depression, recurrent (HCC) 11/22/2023   Hyponatremia 11/11/2023   Adenomatous polyp of transverse colon 06/18/2022  Hx of adenomatous colonic polyps 06/18/2022   Diaphoresis 08/04/2021   Hypersomnia with sleep apnea 08/04/2021   Excessive daytime sleepiness 08/04/2021   Excessive postexertional fatigue 08/04/2021   Diabetes mellitus with coincident hypertension (HCC) 06/17/2021   Hypoglycemia due to insulin  01/11/2021   Primary osteoarthritis of hip 04/30/2019   Former smoker 02/02/2019   Fatty infiltration of liver 11/30/2018   Hypertriglyceridemia    Microalbuminuric diabetic nephropathy (HCC) 06/01/2018   Aortic atherosclerosis (HCC) by Abd  CT scan 2015 05/30/2018   Type 2 diabetes mellitus with vascular disease (HCC) 01/31/2018   Thyroiditis 05/12/2016   Malignant neoplasm of prostate (HCC) 05/05/2016   Diabetic peripheral neuropathy (HCC) 03/06/2015   Medication management 07/24/2014   Vitamin D  deficiency 03/06/2014   Hyperlipidemia associated with type 2 diabetes mellitus (HCC) 06/28/2012   ASCAD s/p PTCA (06/2012) 06/27/2012   Hypertension (1991) 06/25/2012   GERD 06/25/2012    PCP: [name], [name]  REFERRING PROVIDER:  [name], DO   REFERRING DIAG: R26.89 (ICD-10-CM) - Other abnormalities of gait and mobility   THERAPY DIAG:  Muscle weakness (generalized)  Difficulty in walking, not elsewhere classified  Rationale for Evaluation and Treatment: Rehabilitation  ONSET DATE: 03/05/2024  SUBJECTIVE:   SUBJECTIVE STATEMENT: Patient reports that he is doing well with PT so far.   EVAL: Patient reports that he has had this weakness for 5 years. He recalls going to the hospital 3x in the past 2 months, and states that they just discovered his sodium was low during hospitalization 03/05/2024. He states that had PT following his hip replacement in 2020, but had a short POC opting to work on HEP at home. He states that he gets out of breath easily. He sometimes walks up the hill to the mailbox and gets short of breath. He also tends to the garden daily. He states that he only uses his cane for community ambulation.   Patient had recent hospitalization d/t hyponatremia, HTN; physical debilitation muscle weakness  PERTINENT HISTORY: Relevant PMHx includes arthritis, CAD, DM type II, DKA, HTN, LE neuropathy, R THA (2020), Anemia, Depression/Grief (his wife passed away 2 years ago)  PAIN:  Are you having pain? No  PRECAUTIONS: None  RED FLAGS: None   WEIGHT BEARING RESTRICTIONS: No  FALLS:  Has patient fallen in last 6 months? Yes. Number of falls 1; I was carrying groceries upstairs and just  kneeled down after my foot got caught on the last step   LIVING ENVIRONMENT: Lives with: lives alone; his children come over daily.  Lives in: House/apartment Stairs: Yes: External: 3 steps; bilateral but cannot reach both; I don't use the front stairs, because there are no railing on that one. Has following equipment at home: Quad cane small base  OCCUPATION: n/a   PLOF: Independent, Independent with household mobility without device, and Independent with community mobility without device  PATIENT GOALS: To improve strength and endurance if possible.   NEXT MD VISIT: 04/05/2024 with PCP   OBJECTIVE:  Note: Objective measures were completed at Evaluation unless otherwise noted.  DIAGNOSTIC FINDINGS:   No relevant imaging results available in epic; none included in referral    PATIENT SURVEYS:   THE PATIENT SPECIFIC FUNCTIONAL SCALE  Place score of 0-10 (0 = unable to perform activity and 10 = able to perform activity at the same level as before injury or problem)  Activity Date: 03/30/24    Walk (>20 min) 8    2.Heavy Lifting (ie. Top  soil)  5    3. Stairs 5    4.      Total Score 6      Total Score = Sum of activity scores/number of activities  Minimally Detectable Change: 3 points (for single activity); 2 points (for average score)  [facility] (nd). The Patient Specific Functional Scale . Retrieved from [email or web address]    COGNITION: Overall cognitive status: Within functional limits for tasks assessed      LOWER EXTREMITY MMT:  MMT Right eval Left eval  Hip flexion 4+ 4+  Hip extension    Hip abduction 4+ 4+  Hip adduction 4 4  Hip internal rotation    Hip external rotation    Knee flexion 5 5  Knee extension    Ankle dorsiflexion    Ankle plantarflexion    Ankle inversion    Ankle eversion     (Blank rows = not tested)   FUNCTIONAL TESTS:  2 minute walk test: 255 ft; use of  quad cane for initial 30 seconds of test.   GAIT: Distance walked: 255 ft (see above) Assistive device utilized: Quad cane small base Level of assistance: Complete Independence and Modified independence Comments: ambulates with R>L stance time, steady gait speed                                                                                                                                TREATMENT DATE:   [facility] Adult PT Treatment:                                                DATE: 04/11/2024  Therapeutic Exercise: Nustep level 3 x 8 mins to address aerobic capacity Heel raises 2x10 Seated hip adduction 5 hold 2x10 Cybex hip add/abd/ext/flex 17.5# x10 ea BIL  Therapeutic Activity: Step ups 6 fwd/lat x10 ea BIL STS from chair holding 4kg ball with overhead press to upright posture 3x5 Rows standing on Airex GTB x10   [facility] Adult PT Treatment:                                                DATE: 04/06/24 Therapeutic Exercise: Nustep level 3 x 8 mins to address aerobic capacity Heel raises 2x10 Seated hip adduction 5 hold 2x10 Cybex hip abd/ext/flex 17.5# x10 ea BIL Therapeutic Activity: Standing marching 3x30 single UE support Step ups 6 fwd/lat x10 ea BIL STS from chair holding 5# KB x10 - cues for set up and form, controlling descent (next session with weighted ball to [facility] press to promote upright posture) Rows standing on Airex GTB 2x10   [facility] Adult PT Treatment:  DATE: 04/04/24 Therapeutic Exercise: Nustep level 3 x 8 mins to address aerobic capacity Heel raises 2x10 Standing hip abduction/extension x10 ea BIL Seated hip adduction 5 hold 2x10 Therapeutic Activity: Standing marching 3x30 single UE support Step ups 4 fwd/lat x10 ea BIL STS from low chair 2x10 arms crossed (holding 5# KB next session?) Gait 2 laps x185' without cane   [facility] Adult PT Treatment:                                                DATE:  03/30/2024   Initial evaluation: see patient education and home exercise program as noted below      PATIENT EDUCATION:  Education details: reviewed initial home exercise program; discussion of POC, prognosis and goals for skilled PT   Person educated: Patient Education method: Explanation, Demonstration, and Handouts Education comprehension: verbalized understanding, returned demonstration, and needs further education  HOME EXERCISE PROGRAM: Access Code: [email or web address] URL: [email or web address] Date: 04/04/2024 Prepared by: Corean Pouch  Exercises - Heel Raises with Counter Support  - 1 x daily - 7 x weekly - 2 sets - 10 reps - Standing Marching  - 1 x daily - 7 x weekly - 2 sets - 10 reps - Sit to Stand  - 1 x daily - 7 x weekly - 2 sets - 10 reps   ASSESSMENT:  CLINICAL IMPRESSION: Michael Guzman had good tolerance of today's treatment session, which focused on ongoing LE program, including increased time with WB activities. He did require 2 brief seated rest breaks. Patient had most difficulty with [facility] press. We will continue to progress per POC as tolerated, in order to reach established rehab goals.    EVAL: Michael Guzman is a 78 y.o. male who was seen today for physical therapy evaluation and treatment for BIL LE weakness following recent hospitalization. He is demonstrating decreased LE MMT BIL, decreased gait speed, and intermittent use of cane with ambulation. He has related pain and difficulty with long periods of walking, heavy lifting as related to household and yardwork responsibilities, and navigating stiars. He requires skilled PT services at this time to address relevant deficits and improve overall function.     OBJECTIVE IMPAIRMENTS: Abnormal gait, decreased endurance, and decreased strength.   ACTIVITY LIMITATIONS: carrying, lifting, standing, squatting, and stairs  PARTICIPATION LIMITATIONS: interpersonal relationship, community activity, and yard  work  PERSONAL FACTORS: Age, Past/current experiences, Time since onset of injury/illness/exacerbation, and 3+ comorbidities: Relevant PMHx includes arthritis, CAD, DM type II, DKA, HTN, LE neuropathy, R THA (2020), Anemia, Depression are also affecting patient's functional outcome.   REHAB POTENTIAL: Fair    CLINICAL DECISION MAKING: Evolving/moderate complexity  EVALUATION COMPLEXITY: Moderate   GOALS: Goals reviewed with patient? YES  SHORT TERM GOALS: Target date: 04/27/2024   Patient will be independent with initial home program at least 3 days/week.  Baseline: provided at eval Goal Status: INITIAL   2.  Patient will demonstrate at least 4+/5 MMT hip adduction strength BIL.  Baseline: see objective measures Goal Status: INITIAL     LONG TERM GOALS: Target date: 05/25/2024   Patient will report improved overall functional ability with PSFS score of 8 or greater.  Baseline: 6 Goal Status: INITIAL    2.  Patient will demonstrate ability to safely ascend/descend 8 step height at least 20 times.  Baseline: limited d/t mm fatigue  Goal status:  INITIAL  3.  Patient will demonstrate ability to perform floor to waist lifting of at least 25# x 10 repetitions, using appropriate body mechanics and with no more than minimal pain in order to safely perform normal daily/occupational tasks.   Baseline:   Goal Status: INITIAL   4.  Patient will ambulate 300 ft or more during , in order to demonstrate diminished fall risk and improved community ambulation.  Baseline: 215ft (MDC: 40 ft)  Goal status: INITIAL     PLAN:  PT FREQUENCY: 1-2x/week  PT DURATION: 8 weeks  PLANNED INTERVENTIONS: 02835- PT Re-evaluation, 97750- Physical Performance Testing, 97110-Therapeutic exercises, 97530- Therapeutic activity, 97112- Neuromuscular re-education, 97535- Self Care, [id number]- Manual therapy, [id number]- Aquatic Therapy, Patient/Family education, Balance training, Stair training,  Cryotherapy, and Moist heat  PLAN FOR NEXT SESSION: LE strengthening program, use of nustep to address aerobic capacity, further functional tests as indicated for static/dynamic balance and address related deficits as indicated   Date of referral: 03/10/2024 Referring provider:  [name], DO    Referring diagnosis? R26.89 (ICD-10-CM) - Other abnormalities of gait and mobility  Treatment diagnosis? (if different than referring diagnosis)  Muscle weakness (generalized) Difficulty in walking, not elsewhere classified  What was this (referring dx) caused by? Ongoing Issue  Lysle of Condition: Chronic (continuous duration > 3 months)   Laterality: Both  Current Functional Measure Score: Patient Specific Functional Scale 6/10  Objective measurements identify impairments when they are compared to normal values, the uninvolved extremity, and prior level of function.  [x]  Yes  []  No  Objective assessment of functional ability: Moderate functional limitations   Briefly describe symptoms: BIL LE weakness, decreased endurance with daily activities  How did symptoms start: gradual onset, without known MOI. Patient has hx of R THA  Average pain intensity:  Last 24 hours: 0/10  Past week: 0/10  How often does the pt experience symptoms? Frequently  How much have the symptoms interfered with usual daily activities? Moderately  How has condition changed since care began at this facility? NA - initial visit  In general, how is the patients overall health? Fair   BACK PAIN (STarT Back Screening Tool) No   Marko Molt PT  04/11/2024 1:28 PM

## 2024-04-13 ENCOUNTER — Encounter: Payer: Self-pay | Admitting: Physical Therapy

## 2024-04-13 ENCOUNTER — Ambulatory Visit: Admitting: Physical Therapy

## 2024-04-13 DIAGNOSIS — M6281 Muscle weakness (generalized): Secondary | ICD-10-CM

## 2024-04-13 DIAGNOSIS — R262 Difficulty in walking, not elsewhere classified: Secondary | ICD-10-CM

## 2024-04-13 NOTE — Therapy (Signed)
 OUTPATIENT PHYSICAL THERAPY TREATMENT NOTE   Patient Name: Michael Guzman MRN: [id number] DOB:04-23-1946, 78 y.o., male Today's Date: 04/13/2024  END OF SESSION:  PT End of Session - 04/13/24 1037     Visit Number 5    Number of Visits 16    Date for PT Re-Evaluation 05/25/24    Authorization Type [employer] MCR    Authorization Time Period Approved 12 visits 03/30/24-05/11/24    Authorization - Visit Number 4    Authorization - Number of Visits 12    Progress Note Due on Visit 10    PT Start Time 1042    PT Stop Time 1120    PT Time Calculation (min) 38 min    Activity Tolerance Patient tolerated treatment well            Past Medical History:  Diagnosis Date   Arthritis    CAD (coronary artery disease)    a.s/p DES to mid LAD and OM2 06/2012.   Chronic back pain    Chronically dry eyes    Diabetes mellitus without complication (HCC)    TYPE 2    DKA (diabetic ketoacidoses) 01/30/2017   Elevated PSA    being monitored   Fatty liver    GERD (gastroesophageal reflux disease)    Hx of radiation therapy    prostate , [facility]    Hypertension    Hypertriglyceridemia    Intermediate coronary syndrome (HCC) 07/26/2012   Nausea vomiting and diarrhea 11/11/2023   Neuromuscular disorder (HCC)    neuropathy feet and legs   Prostate cancer (HCC)    RADTIATION    Past Surgical History:  Procedure Laterality Date   APPENDECTOMY     CARDIAC SURGERY  ~2017   3 stent placed.   COLONOSCOPY  02/12/2010   Buccini   COLONOSCOPY WITH PROPOFOL  N/A 09/21/2022   Procedure: COLONOSCOPY WITH PROPOFOL ;  Surgeon: Mansouraty, Aloha Raddle., MD;  Location: [facility];  Service: Gastroenterology;  Laterality: N/A;   ENDOSCOPIC MUCOSAL RESECTION N/A 09/21/2022   Procedure: ENDOSCOPIC MUCOSAL RESECTION;  Surgeon: Wilhelmenia Aloha Raddle., MD;  Location: [facility];  Service: Gastroenterology;  Laterality: N/A;   HEMOSTASIS CLIP PLACEMENT  09/21/2022   Procedure: HEMOSTASIS  CLIP PLACEMENT;  Surgeon: Wilhelmenia Aloha Raddle., MD;  Location: [facility];  Service: Gastroenterology;;   LEFT HEART CATHETERIZATION WITH CORONARY ANGIOGRAM N/A 06/27/2012   Procedure: LEFT HEART CATHETERIZATION WITH CORONARY ANGIOGRAM;  Surgeon: Oneil Parchment, MD;  Location: [facility] CATH LAB;  Service: Cardiovascular;  Laterality: N/A;   LEFT HEART CATHETERIZATION WITH CORONARY ANGIOGRAM N/A 07/25/2012   Procedure: LEFT HEART CATHETERIZATION WITH CORONARY ANGIOGRAM;  Surgeon: Wilbert JONELLE Bihari, MD;  Location: [facility]ATH [facility];  Service: Cardiovascular;  Laterality: N/A;   PROSTATE BIOPSY     RADIOLOGY WITH ANESTHESIA N/A 12/03/2015   Procedure: MRI LUMBAR SPINE;  Surgeon: Medication Radiologist, MD;  Location: [facility];  Service: Radiology;  Laterality: N/A;   SUBMUCOSAL LIFTING INJECTION  09/21/2022   Procedure: SUBMUCOSAL LIFTING INJECTION;  Surgeon: Wilhelmenia Aloha Raddle., MD;  Location: [facility];  Service: Gastroenterology;;   TOTAL HIP ARTHROPLASTY Right 04/28/2019   Procedure: TOTAL HIP ARTHROPLASTY ANTERIOR APPROACH;  Surgeon: Yvone Rush, MD;  Location: [facility];  Service: Orthopedics;  Laterality: Right;   Patient Active Problem List   Diagnosis Date Noted   Anemia 02/28/2024   Encounter for Medicare annual wellness exam 12/09/2023   Depression, recurrent (HCC) 11/22/2023   Hyponatremia 11/11/2023   Adenomatous polyp of transverse colon 06/18/2022  Hx of adenomatous colonic polyps 06/18/2022   Diaphoresis 08/04/2021   Hypersomnia with sleep apnea 08/04/2021   Excessive daytime sleepiness 08/04/2021   Excessive postexertional fatigue 08/04/2021   Diabetes mellitus with coincident hypertension (HCC) 06/17/2021   Hypoglycemia due to insulin  01/11/2021   Primary osteoarthritis of hip 04/30/2019   Former smoker 02/02/2019   Fatty infiltration of liver 11/30/2018   Hypertriglyceridemia    Microalbuminuric diabetic nephropathy (HCC) 06/01/2018   Aortic atherosclerosis (HCC) by Abd CT scan 2015  05/30/2018   Type 2 diabetes mellitus with vascular disease (HCC) 01/31/2018   Thyroiditis 05/12/2016   Malignant neoplasm of prostate (HCC) 05/05/2016   Diabetic peripheral neuropathy (HCC) 03/06/2015   Medication management 07/24/2014   Vitamin D  deficiency 03/06/2014   Hyperlipidemia associated with type 2 diabetes mellitus (HCC) 06/28/2012   ASCAD s/p PTCA (06/2012) 06/27/2012   Hypertension (1991) 06/25/2012   GERD 06/25/2012    PCP: [name], [name]  REFERRING PROVIDER:  [name], DO   REFERRING DIAG: R26.89 (ICD-10-CM) - Other abnormalities of gait and mobility   THERAPY DIAG:  Muscle weakness (generalized)  Difficulty in walking, not elsewhere classified  Rationale for Evaluation and Treatment: Rehabilitation  ONSET DATE: 03/05/2024  SUBJECTIVE:   SUBJECTIVE STATEMENT: 04/13/2024: felt okay after last session, states he feels like PT has been helpful. Feels like it has been too hot to do too much and makes him feel sore/stiff. States he has been more fatigued the last couple days but tends to fluctuate.  EVAL: Patient reports that he has had this weakness for 5 years. He recalls going to the hospital 3x in the past 2 months, and states that they just discovered his sodium was low during hospitalization 03/05/2024. He states that had PT following his hip replacement in 2020, but had a short POC opting to work on HEP at home. He states that he gets out of breath easily. He sometimes walks up the hill to the mailbox and gets short of breath. He also tends to the garden daily. He states that he only uses his cane for community ambulation.   Patient had recent hospitalization d/t hyponatremia, HTN; physical debilitation muscle weakness  PERTINENT HISTORY: Relevant PMHx includes arthritis, CAD, DM type II, DKA, HTN, LE neuropathy, R THA (2020), Anemia, Depression/Grief (his wife passed away 2 years ago)  PAIN:  Are you having pain? No  PRECAUTIONS: None  RED  FLAGS: None   WEIGHT BEARING RESTRICTIONS: No  FALLS:  Has patient fallen in last 6 months? Yes. Number of falls 1; I was carrying groceries upstairs and just kneeled down after my foot got caught on the last step   LIVING ENVIRONMENT: Lives with: lives alone; his children come over daily.  Lives in: House/apartment Stairs: Yes: External: 3 steps; bilateral but cannot reach both; I don't use the front stairs, because there are no railing on that one. Has following equipment at home: Quad cane small base  OCCUPATION: n/a   PLOF: Independent, Independent with household mobility without device, and Independent with community mobility without device  PATIENT GOALS: To improve strength and endurance if possible.   NEXT MD VISIT: 04/05/2024 with PCP   OBJECTIVE:  Note: Objective measures were completed at Evaluation unless otherwise noted.  DIAGNOSTIC FINDINGS:   No relevant imaging results available in epic; none included in referral    PATIENT SURVEYS:   THE PATIENT SPECIFIC FUNCTIONAL SCALE  Place score of 0-10 (0 = unable to perform activity and 10 =  able to perform activity at the same level as before injury or problem)  Activity Date: 03/30/24    Walk (>20 min) 8    2.Heavy Lifting (ie. Top soil)  5    3. Stairs 5    4.      Total Score 6      Total Score = Sum of activity scores/number of activities  Minimally Detectable Change: 3 points (for single activity); 2 points (for average score)  [facility] (nd). The Patient Specific Functional Scale . Retrieved from [email or web address]    COGNITION: Overall cognitive status: Within functional limits for tasks assessed      LOWER EXTREMITY MMT:  MMT Right eval Left eval  Hip flexion 4+ 4+  Hip extension    Hip abduction 4+ 4+  Hip adduction 4 4  Hip internal rotation    Hip external rotation    Knee flexion 5 5  Knee extension    Ankle  dorsiflexion    Ankle plantarflexion    Ankle inversion    Ankle eversion     (Blank rows = not tested)   FUNCTIONAL TESTS:  2 minute walk test: 255 ft; use of quad cane for initial 30 seconds of test.   GAIT: Distance walked: 255 ft (see above) Assistive device utilized: Quad cane small base Level of assistance: Complete Independence and Modified independence Comments: ambulates with R>L stance time, steady gait speed                                                                                                                                TREATMENT DATE:   [facility] Adult PT Treatment:                                                DATE: 04/13/24 Therapeutic Exercise: Nu step L5 LE/UE during subjective Green band hip abd (band at knee) 2x8 BIL cues for posture  Heel raises x12 at counter Standing march x8 BIL Green band hamstring curl x15 BIL LE  Therapeutic Activity: STS+ 5kg OH press x5 STS 2x12 Pacing of tasks, education/discussion re: fatigue management and activity modification    [facility] Adult PT Treatment:                                                DATE: 04/11/2024  Therapeutic Exercise: Nustep level 3 x 8 mins to address aerobic capacity Heel raises 2x10 Seated hip adduction 5 hold 2x10 Cybex hip add/abd/ext/flex 17.5# x10 ea BIL  Therapeutic Activity: Step ups 6 fwd/lat x10 ea BIL STS from chair holding 4kg ball with overhead press to upright posture 3x5 Rows standing on  Airex GTB x10   [facility] Adult PT Treatment:                                                DATE: 04/06/24 Therapeutic Exercise: Nustep level 3 x 8 mins to address aerobic capacity Heel raises 2x10 Seated hip adduction 5 hold 2x10 Cybex hip abd/ext/flex 17.5# x10 ea BIL Therapeutic Activity: Standing marching 3x30 single UE support Step ups 6 fwd/lat x10 ea BIL STS from chair holding 5# KB x10 - cues for set up and form, controlling descent (next session with weighted ball to  [facility] press to promote upright posture) Rows standing on Airex GTB 2x10   [facility] Adult PT Treatment:                                                DATE: 04/04/24 Therapeutic Exercise: Nustep level 3 x 8 mins to address aerobic capacity Heel raises 2x10 Standing hip abduction/extension x10 ea BIL Seated hip adduction 5 hold 2x10 Therapeutic Activity: Standing marching 3x30 single UE support Step ups 4 fwd/lat x10 ea BIL STS from low chair 2x10 arms crossed (holding 5# KB next session?) Gait 2 laps x185' without cane   [facility] Adult PT Treatment:                                                DATE: 03/30/2024   Initial evaluation: see patient education and home exercise program as noted below      PATIENT EDUCATION:  Education details: rationale for interventions, HEP  Person educated: Patient Education method: Explanation, Demonstration, Tactile cues, Verbal cues Education comprehension: verbalized understanding, returned demonstration, verbal cues required, tactile cues required, and needs further education     HOME EXERCISE PROGRAM: Access Code: [email or web address] URL: [email or web address] Date: 04/04/2024 Prepared by: Corean Pouch  Exercises - Heel Raises with Counter Support  - 1 x daily - 7 x weekly - 2 sets - 10 reps - Standing Marching  - 1 x daily - 7 x weekly - 2 sets - 10 reps - Sit to Stand  - 1 x daily - 7 x weekly - 2 sets - 10 reps   ASSESSMENT:  CLINICAL IMPRESSION: 04/13/2024: Pt arrives w/ report of fatigue but overall continuing to do well. Given report of increased fatigue today we do make some modifications to accommodate, continuing to focus on functional mechanics and pacing of tasks. Also working more focal strengthening to better mitigate fatigue today. No adverse events, tolerates well overall, primarily limited by fatigue. Recommend continuing along current POC in order to address relevant deficits and improve functional tolerance.Pt departs  today's session in no acute distress, all voiced questions/concerns addressed appropriately from PT perspective.     EVAL: [place] is a 78 y.o. male who was seen today for physical therapy evaluation and treatment for BIL LE weakness following recent hospitalization. He is demonstrating decreased LE MMT BIL, decreased gait speed, and intermittent use of cane with ambulation. He has related pain and difficulty with long periods of walking, heavy lifting as related to household and yardwork responsibilities, and navigating  stiars. He requires skilled PT services at this time to address relevant deficits and improve overall function.     OBJECTIVE IMPAIRMENTS: Abnormal gait, decreased endurance, and decreased strength.   ACTIVITY LIMITATIONS: carrying, lifting, standing, squatting, and stairs  PARTICIPATION LIMITATIONS: interpersonal relationship, community activity, and yard work  PERSONAL FACTORS: Age, Past/current experiences, Time since onset of injury/illness/exacerbation, and 3+ comorbidities: Relevant PMHx includes arthritis, CAD, DM type II, DKA, HTN, LE neuropathy, R THA (2020), Anemia, Depression are also affecting patient's functional outcome.   REHAB POTENTIAL: Fair    CLINICAL DECISION MAKING: Evolving/moderate complexity  EVALUATION COMPLEXITY: Moderate   GOALS: Goals reviewed with patient? YES  SHORT TERM GOALS: Target date: 04/27/2024   Patient will be independent with initial home program at least 3 days/week.  Baseline: provided at eval Goal Status: INITIAL   2.  Patient will demonstrate at least 4+/5 MMT hip adduction strength BIL.  Baseline: see objective measures Goal Status: INITIAL     LONG TERM GOALS: Target date: 05/25/2024   Patient will report improved overall functional ability with PSFS score of 8 or greater.  Baseline: 6 Goal Status: INITIAL    2.  Patient will demonstrate ability to safely ascend/descend 8 step height at least 20 times.   Baseline: limited d/t mm fatigue  Goal status: INITIAL  3.  Patient will demonstrate ability to perform floor to waist lifting of at least 25# x 10 repetitions, using appropriate body mechanics and with no more than minimal pain in order to safely perform normal daily/occupational tasks.   Baseline:   Goal Status: INITIAL   4.  Patient will ambulate 300 ft or more during , in order to demonstrate diminished fall risk and improved community ambulation.  Baseline: 264ft (MDC: 40 ft)  Goal status: INITIAL     PLAN:  PT FREQUENCY: 1-2x/week  PT DURATION: 8 weeks  PLANNED INTERVENTIONS: 02835- PT Re-evaluation, 97750- Physical Performance Testing, 97110-Therapeutic exercises, 97530- Therapeutic activity, 97112- Neuromuscular re-education, 97535- Self Care, 02859- Manual therapy, [phone]- Aquatic Therapy, Patient/Family education, Balance training, Stair training, Cryotherapy, and Moist heat  PLAN FOR NEXT SESSION: LE strengthening program, use of nustep to address aerobic capacity, further functional tests as indicated for static/dynamic balance and address related deficits as indicated   Date of referral: 03/10/2024 Referring provider:  [name], DO    Referring diagnosis? R26.89 (ICD-10-CM) - Other abnormalities of gait and mobility  Treatment diagnosis? (if different than referring diagnosis)  Muscle weakness (generalized) Difficulty in walking, not elsewhere classified  What was this (referring dx) caused by? Ongoing Issue  Lysle of Condition: Chronic (continuous duration > 3 months)   Laterality: Both  Current Functional Measure Score: Patient Specific Functional Scale 6/10  Objective measurements identify impairments when they are compared to normal values, the uninvolved extremity, and prior level of function.  [x]  Yes  []  No  Objective assessment of functional ability: Moderate functional limitations   Briefly describe symptoms: BIL LE weakness,  decreased endurance with daily activities  How did symptoms start: gradual onset, without known MOI. Patient has hx of R THA  Average pain intensity:  Last 24 hours: 0/10  Past week: 0/10  How often does the pt experience symptoms? Frequently  How much have the symptoms interfered with usual daily activities? Moderately  How has condition changed since care began at this facility? NA - initial visit  In general, how is the patients overall health? Fair   BACK PAIN (STarT Back Screening Tool) No  Alm DELENA Jenny PT, DPT 04/13/2024 11:25 AM

## 2024-04-14 ENCOUNTER — Other Ambulatory Visit: Payer: Self-pay

## 2024-04-14 NOTE — Progress Notes (Signed)
 04/14/2024 Name: Michael Guzman MRN: [id number] DOB: [birth year]  Chief Complaint  Patient presents with   Medication Management   Diabetes    Michael Guzman is a 78 y.o. year old male who presented for a telephone visit.   They were referred to the pharmacist by their PCP for assistance in managing diabetes.    Subjective:  Care Team: Primary Care Provider: [name], [name] ; Next Scheduled Visit:  Future Appointments  Date Time Provider Department Center  04/18/2024 10:45 AM [name] DELENA ALMETA [facility]  04/20/2024 10:00 AM [name] DELENA ALMETA [facility]  04/25/2024 10:45 AM [name], PT [facility]  04/27/2024  9:15 AM [name], PTA [facility]  04/28/2024  1:30 PM [name], RPH [facility] None  05/31/2024 10:15 AM [facility]  06/06/2024 10:45 AM [name], [name] [facility]  12/14/2024 10:20 AM [facility]-ANNUAL WELLNESS VISIT [facility]     Medication Access/Adherence  Current Pharmacy:  [facility] [place], [place] - [address] [place] [address] Phone: [phone] Fax: [phone]   Patient reports affordability concerns with their medications: No  Patient reports access/transportation concerns to their pharmacy: No  Patient reports adherence concerns with their medications:  Just omission of pm 70/30 about every 5 days     Diabetes:  Current medications:  Medications tried in the past: Trulicity  stopped d/t cost.   Current glucose readings: had trouble verbalizing some of his readings. Denies readings above low 200s. Checks with finger sticks if feeling off.   Patient denies hypoglycemic s/sx including dizziness, shakiness, sweating. Patient denies hyperglycemic symptoms including polyuria, polydipsia, polyphagia, nocturia, neuropathy, blurred vision.  Current meal patterns:  - bacon eggs cheese; melons cut up in fridge.  No routine  sodas, will have Gatorade mixed with water   Current medication access support: none; estimates income near 60k/yr   Objective:  Lab Results  Component Value Date   HGBA1C 7.1 (H) 02/25/2024    Lab Results  Component Value Date   CREATININE 0.96 03/15/2024   BUN 11 03/15/2024   NA 136 03/15/2024   K 4.7 03/15/2024   CL 98 03/15/2024   CO2 28 03/15/2024    Lab Results  Component Value Date   CHOL 142 02/25/2024   HDL 60 02/25/2024   LDLCALC 63 02/25/2024   LDLDIRECT 45.7 10/30/2013   TRIG 100 02/25/2024   CHOLHDL 2.4 02/25/2024    Medications Reviewed Today     Reviewed by [name], [facility] (Pharmacist) on 04/14/24 at 1133  Med List Status: <None>   Medication Order Taking? Sig Documenting Provider Last Dose Status Informant  Ascorbic Acid (VITAMIN C) 1000 MG tablet [id number]  Take 1,000 mg by mouth in the morning. [provider]  Active Self, Pharmacy Records  aspirin  EC 81 MG tablet [id number]  Take 1 tablet (81 mg total) by mouth daily. Michael Oneil BROCKS, MD  Active Self, Pharmacy Records           Med Note [name] Sep 17, 2022  2:44 PM)    atorvastatin  (LIPITOR ) 10 MG tablet [id number]  TAKE 1 TABLET BY MOUTH DAILY [name], [name], NP  Active Self, Pharmacy Records  B Complex-C (SUPER B COMPLEX PO) [id number]  Take 1 tablet by mouth in the morning. [provider]  Active Self, Pharmacy Records  Blood Glucose Monitoring Suppl (ONE TOUCH ULTRA 2) w/Device  KIT [id number]  Use as directed [name], [name], NP  Active Self, Pharmacy Records  Cholecalciferol  (VITAMIN D3) 250 MCG (10000 UT) capsule [id number]  Take 10,000 Units by mouth in the morning. [provider]  Active Self, Pharmacy Records  Continuous Glucose Sensor (FREESTYLE LIBRE 3 PLUS SENSOR) [place] [id number]  Use to monitor blood sugar continuously. Change sensor every 15 days. Kayla Gauze S, [name]  Active   gabapentin  (NEURONTIN ) 800 MG tablet [id number]  TAKE 1 TABLET BY  MOUTH 3 TO 4 TIMES DAILY FOR PAIN, HOT FLASHES AND SWEATS Kayla Gauze RAMAN, [name]  Active Self, Pharmacy Records  glucose blood [facility] ULTRA) test strip [id number]  CHECK BLOOD SUGAR 3 TIMES  DAILY Kayla Gauze RAMAN, [name]  Active Self, Pharmacy Records  glycopyrrolate  (ROBINUL ) 2 MG tablet [id number]  Take  1 tablet  3 x / day for Excessive Sweating  TAKE ONE TABLET BY                                                 MOUTH Kayla Gauze RAMAN, [name]  Active   insulin  NPH-regular Human (HUMULIN  70/30) (70-30) 100 UNIT/ML injection [id number]  INJECT SUBCUTANEOUSLY 40  UNITS TWICE DAILY Kayla Gauze RAMAN, [place]  Active   Insulin  Pen Needle (NOVOFINE) 30G X 8 MM MISC [id number]  Inject 10 each into the skin as needed. Kayla Gauze RAMAN, [name]  Active   Insulin  Syringes, Disposable, U-100 1 ML MISC [id number]  50 Units by Does not apply route 2 (two) times daily. inject 50 units into skin 2 x /day [name], MD  Active Self, Pharmacy Records  isosorbide  mononitrate (IMDUR ) 30 MG 24 hr tablet [id number]  Take 1 tablet (30 mg total) by mouth daily. Kayla Gauze RAMAN, [name]  Active Self, Pharmacy Records  Lancets [facility] CATHRYNE PLUS [place]) MISC [id number]  CHECK BLOOD SUGAR 3 TIMES  DAILY [name], MD  Active Self, Pharmacy Records  latanoprost  (XALATAN ) 0.005 % ophthalmic solution [id number]  Place 1 drop into both eyes at bedtime. [provider]  Active Self, Pharmacy Records  Magnesium  500 MG TABS [id number]  Take 500 mg by mouth in the morning. [provider]  Active Self, Pharmacy Records  metFORMIN  (GLUCOPHAGE -XR) 500 MG 24 hr tablet [id number]  TAKE 2 TABLETS BY MOUTH TWICE  DAILY WITH MEALS FOR DIABETES [name], [name], NP  Active Self, Pharmacy Records  metoprolol  succinate (TOPROL -XL) 25 MG 24 hr tablet [id number]  TAKE ONE-HALF TABLET BY MOUTH  DAILY Michael Oneil BROCKS, MD  Active Self, Pharmacy Records  montelukast  (SINGULAIR ) 10 MG tablet [id number]  TAKE 1 TABLET BY MOUTH DAILY FOR ALLERGIES  Kayla Gauze RAMAN, [name]  Active   nitroGLYCERIN  (NITROSTAT ) 0.4 MG SL tablet [id number]  DISSOLVE 1 TABLET UNDER THE  TONGUE EVERY 5 MINUTES AS NEEDED FOR CHEST PAIN. MAX OF 3 TABLETS IN 15 MINUTES. CALL 911 IF PAIN  PERSISTS. Kayla Gauze RAMAN, [name]  Active Self, Pharmacy Records  pantoprazole  (PROTONIX ) 40 MG tablet [id number]  TAKE 1 TABLET BY MOUTH DAILY TO  PREVENT HEARTBURN AND  INDIGESTION Kayla Gauze RAMAN, [name]  Active   sodium chloride  1 g tablet [id number]  Take 1 tablet (1 g total) by mouth 2 (two) times daily with a meal. [name], DO  Active   tamsulosin  (FLOMAX ) 0.4 MG CAPS capsule [id number]  Take 0.4  mg by mouth at bedtime. [provider]  Active Self, Pharmacy Records  Vitamin E 450 MG (1000 UT) CAPS [id number]  Take 450 mg by mouth in the morning. [provider]  Active Self, Pharmacy Records  zinc gluconate 50 MG tablet [id number]  Take 50 mg by mouth in the morning. [provider]  Active Self, Pharmacy Records              Assessment/Plan:   Diabetes: - Currently controlled - Reviewed long term cardiovascular and renal outcomes of uncontrolled blood sugar - Reviewed goal A1c, goal fasting, and goal 2 hour post prandial glucose  - encouraged patient to login to portal and approved my request to remotely view BG readings especially given limited information able to be provided today  - Reviewed dietary modifications including low carb diet, swapping out berries for other fruits Recommend to check glucose continuously - Reviewed insurance information  [employer] [place] 0015: Rx deductible $255/year $47 monthly copay for Mounjaro, Trulicity , Ozempic .   [place] or Lantus  preferred long acting insulins  Humalog preferred for mealtime insulin   All insulin  copayments would be $35/month   - Reviewed PAP options - with income of ~60k/yr, may qualify for ozempic  should that be an option moving forward. Previously tolerated Trulicity  and stopped d/t cost.   -  Reviewed possibility of converting from novolin 70/30 to single daily long acting insulin  +/- mealtime insulin , likely could start basal at 58 units tresiba or lantus  daily; wants to think over given possibility of having 4 injections (1 long acting in the morning, 1 injection with breakfast lunch and dinner) per day versus current 2 injections per day.   Reports novolin 70/30 40 units BID (about once every 5 days will omit PM dose) - averages out to 72 units total daily insulin , 80% of this would be about 58 units daily for basal insulin  to start. Noted that PCP stated 04/05/24 in OV note that she is willing to consider change to tresiba daily.   Follow Up Plan: patient call in two weeks to discuss these options.   Lang Sieve, PharmD, BCGP Clinical Pharmacist  [phone]

## 2024-04-17 NOTE — Therapy (Incomplete)
 OUTPATIENT PHYSICAL THERAPY TREATMENT NOTE   Patient Name: Michael Guzman MRN: [id number] DOB:1946/07/07, 78 y.o., male Today's Date: 04/17/2024  END OF SESSION:      Past Medical History:  Diagnosis Date   Arthritis    CAD (coronary artery disease)    a.s/p DES to mid LAD and OM2 06/2012.   Chronic back pain    Chronically dry eyes    Diabetes mellitus without complication (HCC)    TYPE 2    DKA (diabetic ketoacidoses) 01/30/2017   Elevated PSA    being monitored   Fatty liver    GERD (gastroesophageal reflux disease)    Hx of radiation therapy    prostate , [facility]    Hypertension    Hypertriglyceridemia    Intermediate coronary syndrome (HCC) 07/26/2012   Nausea vomiting and diarrhea 11/11/2023   Neuromuscular disorder (HCC)    neuropathy feet and legs   Prostate cancer (HCC)    RADTIATION    Past Surgical History:  Procedure Laterality Date   APPENDECTOMY     CARDIAC SURGERY  ~2017   3 stent placed.   COLONOSCOPY  02/12/2010   Buccini   COLONOSCOPY WITH PROPOFOL  N/A 09/21/2022   Procedure: COLONOSCOPY WITH PROPOFOL ;  Surgeon: Wilhelmenia Aloha Raddle., MD;  Location: [facility];  Service: Gastroenterology;  Laterality: N/A;   ENDOSCOPIC MUCOSAL RESECTION N/A 09/21/2022   Procedure: ENDOSCOPIC MUCOSAL RESECTION;  Surgeon: Wilhelmenia Aloha Raddle., MD;  Location: [facility];  Service: Gastroenterology;  Laterality: N/A;   HEMOSTASIS CLIP PLACEMENT  09/21/2022   Procedure: HEMOSTASIS CLIP PLACEMENT;  Surgeon: Wilhelmenia Aloha Raddle., MD;  Location: [facility];  Service: Gastroenterology;;   LEFT HEART CATHETERIZATION WITH CORONARY ANGIOGRAM N/A 06/27/2012   Procedure: LEFT HEART CATHETERIZATION WITH CORONARY ANGIOGRAM;  Surgeon: Oneil Parchment, MD;  Location: [facility] LAB;  Service: Cardiovascular;  Laterality: N/A;   LEFT HEART CATHETERIZATION WITH CORONARY ANGIOGRAM N/A 07/25/2012   Procedure: LEFT HEART CATHETERIZATION WITH CORONARY ANGIOGRAM;   Surgeon: Wilbert JONELLE Bihari, MD;  Location: [facility]ATH LAB;  Service: Cardiovascular;  Laterality: N/A;   PROSTATE BIOPSY     RADIOLOGY WITH ANESTHESIA N/A 12/03/2015   Procedure: MRI LUMBAR SPINE;  Surgeon: Medication Radiologist, MD;  Location: [facility];  Service: Radiology;  Laterality: N/A;   SUBMUCOSAL LIFTING INJECTION  09/21/2022   Procedure: SUBMUCOSAL LIFTING INJECTION;  Surgeon: Wilhelmenia Aloha Raddle., MD;  Location: [facility];  Service: Gastroenterology;;   TOTAL HIP ARTHROPLASTY Right 04/28/2019   Procedure: TOTAL HIP ARTHROPLASTY ANTERIOR APPROACH;  Surgeon: Yvone Rush, MD;  Location: [facility];  Service: Orthopedics;  Laterality: Right;   Patient Active Problem List   Diagnosis Date Noted   Anemia 02/28/2024   Encounter for Medicare annual wellness exam 12/09/2023   Depression, recurrent (HCC) 11/22/2023   Hyponatremia 11/11/2023   Adenomatous polyp of transverse colon 06/18/2022   Hx of adenomatous colonic polyps 06/18/2022   Diaphoresis 08/04/2021   Hypersomnia with sleep apnea 08/04/2021   Excessive daytime sleepiness 08/04/2021   Excessive postexertional fatigue 08/04/2021   Diabetes mellitus with coincident hypertension (HCC) 06/17/2021   Hypoglycemia due to insulin  01/11/2021   Primary osteoarthritis of hip 04/30/2019   Former smoker 02/02/2019   Fatty infiltration of liver 11/30/2018   Hypertriglyceridemia    Microalbuminuric diabetic nephropathy (HCC) 06/01/2018   Aortic atherosclerosis (HCC) by Abd CT scan 2015 05/30/2018   Type 2 diabetes mellitus with vascular disease (HCC) 01/31/2018   Thyroiditis 05/12/2016   Malignant neoplasm of prostate (HCC) 05/05/2016  Diabetic peripheral neuropathy (HCC) 03/06/2015   Medication management 07/24/2014   Vitamin D  deficiency 03/06/2014   Hyperlipidemia associated with type 2 diabetes mellitus (HCC) 06/28/2012   ASCAD s/p PTCA (06/2012) 06/27/2012   Hypertension (1991) 06/25/2012   GERD 06/25/2012    PCP: [name],  [name]  REFERRING PROVIDER:  [name], DO   REFERRING DIAG: R26.89 (ICD-10-CM) - Other abnormalities of gait and mobility   THERAPY DIAG:  No diagnosis found.  Rationale for Evaluation and Treatment: Rehabilitation  ONSET DATE: 03/05/2024  SUBJECTIVE:   SUBJECTIVE STATEMENT: 04/17/2024: ***  *** felt okay after last session, states he feels like PT has been helpful. Feels like it has been too hot to do too much and makes him feel sore/stiff. States he has been more fatigued the last couple days but tends to fluctuate.  EVAL: Patient reports that he has had this weakness for 5 years. He recalls going to the hospital 3x in the past 2 months, and states that they just discovered his sodium was low during hospitalization 03/05/2024. He states that had PT following his hip replacement in 2020, but had a short POC opting to work on HEP at home. He states that he gets out of breath easily. He sometimes walks up the hill to the mailbox and gets short of breath. He also tends to the garden daily. He states that he only uses his cane for community ambulation.   Patient had recent hospitalization d/t hyponatremia, HTN; physical debilitation muscle weakness  PERTINENT HISTORY: Relevant PMHx includes arthritis, CAD, DM type II, DKA, HTN, LE neuropathy, R THA (2020), Anemia, Depression/Grief (his wife passed away 2 years ago)  PAIN:  Are you having pain? No  PRECAUTIONS: None  RED FLAGS: None   WEIGHT BEARING RESTRICTIONS: No  FALLS:  Has patient fallen in last 6 months? Yes. Number of falls 1; I was carrying groceries upstairs and just kneeled down after my foot got caught on the last step   LIVING ENVIRONMENT: Lives with: lives alone; his children come over daily.  Lives in: House/apartment Stairs: Yes: External: 3 steps; bilateral but cannot reach both; I don't use the front stairs, because there are no railing on that one. Has following equipment at home: Quad cane small  base  OCCUPATION: n/a   PLOF: Independent, Independent with household mobility without device, and Independent with community mobility without device  PATIENT GOALS: To improve strength and endurance if possible.   NEXT MD VISIT: 04/05/2024 with PCP   OBJECTIVE:  Note: Objective measures were completed at Evaluation unless otherwise noted.  DIAGNOSTIC FINDINGS:   No relevant imaging results available in epic; none included in referral    PATIENT SURVEYS:   THE PATIENT SPECIFIC FUNCTIONAL SCALE  Place score of 0-10 (0 = unable to perform activity and 10 = able to perform activity at the same level as before injury or problem)  Activity Date: 03/30/24    Walk (>20 min) 8    2.Heavy Lifting (ie. Top soil)  5    3. Stairs 5    4.      Total Score 6      Total Score = Sum of activity scores/number of activities  Minimally Detectable Change: 3 points (for single activity); 2 points (for average score)  [facility] (nd). The Patient Specific Functional Scale . Retrieved from [email or web address]    COGNITION: Overall cognitive status: Within functional limits for tasks assessed      LOWER  EXTREMITY MMT:  MMT Right eval Left eval  Hip flexion 4+ 4+  Hip extension    Hip abduction 4+ 4+  Hip adduction 4 4  Hip internal rotation    Hip external rotation    Knee flexion 5 5  Knee extension    Ankle dorsiflexion    Ankle plantarflexion    Ankle inversion    Ankle eversion     (Blank rows = not tested)   FUNCTIONAL TESTS:  2 minute walk test: 255 ft; use of quad cane for initial 30 seconds of test.   GAIT: Distance walked: 255 ft (see above) Assistive device utilized: Quad cane small base Level of assistance: Complete Independence and Modified independence Comments: ambulates with R>L stance time, steady gait speed                                                                                                                                 TREATMENT DATE:   [facility] Adult PT Treatment:                                                DATE: 04/18/24 Therapeutic Exercise: *** Manual Therapy: *** Neuromuscular re-ed: *** Therapeutic Activity: *** Modalities: *** Self Care: ***    [facility] Adult PT Treatment:                                                DATE: 04/13/24 Therapeutic Exercise: Nu step L5 LE/UE during subjective Green band hip abd (band at knee) 2x8 BIL cues for posture  Heel raises x12 at counter Standing march x8 BIL Green band hamstring curl x15 BIL LE  Therapeutic Activity: STS+ 5kg OH press x5 STS 2x12 Pacing of tasks, education/discussion re: fatigue management and activity modification    [facility] Adult PT Treatment:                                                DATE: 04/11/2024  Therapeutic Exercise: Nustep level 3 x 8 mins to address aerobic capacity Heel raises 2x10 Seated hip adduction 5 hold 2x10 Cybex hip add/abd/ext/flex 17.5# x10 ea BIL  Therapeutic Activity: Step ups 6 fwd/lat x10 ea BIL STS from chair holding 4kg ball with overhead press to upright posture 3x5 Rows standing on Airex GTB x10   [facility] Adult PT Treatment:  DATE: 04/06/24 Therapeutic Exercise: Nustep level 3 x 8 mins to address aerobic capacity Heel raises 2x10 Seated hip adduction 5 hold 2x10 Cybex hip abd/ext/flex 17.5# x10 ea BIL Therapeutic Activity: Standing marching 3x30 single UE support Step ups 6 fwd/lat x10 ea BIL STS from chair holding 5# KB x10 - cues for set up and form, controlling descent (next session with weighted ball to [facility] press to promote upright posture) Rows standing on Airex GTB 2x10    PATIENT EDUCATION:  Education details: rationale for interventions, HEP  Person educated: Patient Education method: Explanation, Demonstration, Tactile cues, Verbal cues Education comprehension: verbalized  understanding, returned demonstration, verbal cues required, tactile cues required, and needs further education     HOME EXERCISE PROGRAM: Access Code: [email or web address] URL: [email or web address] Date: 04/04/2024 Prepared by: Corean Pouch  Exercises - Heel Raises with Counter Support  - 1 x daily - 7 x weekly - 2 sets - 10 reps - Standing Marching  - 1 x daily - 7 x weekly - 2 sets - 10 reps - Sit to Stand  - 1 x daily - 7 x weekly - 2 sets - 10 reps   ASSESSMENT:  CLINICAL IMPRESSION: 04/17/2024: ***  *** Pt arrives w/ report of fatigue but overall continuing to do well. Given report of increased fatigue today we do make some modifications to accommodate, continuing to focus on functional mechanics and pacing of tasks. Also working more focal strengthening to better mitigate fatigue today. No adverse events, tolerates well overall, primarily limited by fatigue. Recommend continuing along current POC in order to address relevant deficits and improve functional tolerance.Pt departs today's session in no acute distress, all voiced questions/concerns addressed appropriately from PT perspective.     EVAL: Rolf is a 78 y.o. male who was seen today for physical therapy evaluation and treatment for BIL LE weakness following recent hospitalization. He is demonstrating decreased LE MMT BIL, decreased gait speed, and intermittent use of cane with ambulation. He has related pain and difficulty with long periods of walking, heavy lifting as related to household and yardwork responsibilities, and navigating stiars. He requires skilled PT services at this time to address relevant deficits and improve overall function.     OBJECTIVE IMPAIRMENTS: Abnormal gait, decreased endurance, and decreased strength.   ACTIVITY LIMITATIONS: carrying, lifting, standing, squatting, and stairs  PARTICIPATION LIMITATIONS: interpersonal relationship, community activity, and yard work  PERSONAL FACTORS:  Age, Past/current experiences, Time since onset of injury/illness/exacerbation, and 3+ comorbidities: Relevant PMHx includes arthritis, CAD, DM type II, DKA, HTN, LE neuropathy, R THA (2020), Anemia, Depression are also affecting patient's functional outcome.   REHAB POTENTIAL: Fair    CLINICAL DECISION MAKING: Evolving/moderate complexity  EVALUATION COMPLEXITY: Moderate   GOALS: Goals reviewed with patient? YES  SHORT TERM GOALS: Target date: 04/27/2024   Patient will be independent with initial home program at least 3 days/week.  Baseline: provided at eval Goal Status: INITIAL   2.  Patient will demonstrate at least 4+/5 MMT hip adduction strength BIL.  Baseline: see objective measures Goal Status: INITIAL     LONG TERM GOALS: Target date: 05/25/2024   Patient will report improved overall functional ability with PSFS score of 8 or greater.  Baseline: 6 Goal Status: INITIAL    2.  Patient will demonstrate ability to safely ascend/descend 8 step height at least 20 times.  Baseline: limited d/t mm fatigue  Goal status: INITIAL  3.  Patient will demonstrate ability to perform floor to waist lifting of  at least 25# x 10 repetitions, using appropriate body mechanics and with no more than minimal pain in order to safely perform normal daily/occupational tasks.   Baseline:   Goal Status: INITIAL   4.  Patient will ambulate 300 ft or more during , in order to demonstrate diminished fall risk and improved community ambulation.  Baseline: 269ft (MDC: 40 ft)  Goal status: INITIAL     PLAN:  PT FREQUENCY: 1-2x/week  PT DURATION: 8 weeks  PLANNED INTERVENTIONS: 02835- PT Re-evaluation, 97750- Physical Performance Testing, 97110-Therapeutic exercises, 97530- Therapeutic activity, 97112- Neuromuscular re-education, 97535- Self Care, 02859- Manual therapy, [phone]- Aquatic Therapy, Patient/Family education, Balance training, Stair training, Cryotherapy, and Moist heat  PLAN  FOR NEXT SESSION: LE strengthening program, use of nustep to address aerobic capacity, further functional tests as indicated for static/dynamic balance and address related deficits as indicated   Date of referral: 03/10/2024 Referring provider:  [name], DO    Referring diagnosis? R26.89 (ICD-10-CM) - Other abnormalities of gait and mobility  Treatment diagnosis? (if different than referring diagnosis)  Muscle weakness (generalized) Difficulty in walking, not elsewhere classified  What was this (referring dx) caused by? Ongoing Issue  Lysle of Condition: Chronic (continuous duration > 3 months)   Laterality: Both  Current Functional Measure Score: Patient Specific Functional Scale 6/10  Objective measurements identify impairments when they are compared to normal values, the uninvolved extremity, and prior level of function.  [x]  Yes  []  No  Objective assessment of functional ability: Moderate functional limitations   Briefly describe symptoms: BIL LE weakness, decreased endurance with daily activities  How did symptoms start: gradual onset, without known MOI. Patient has hx of R THA  Average pain intensity:  Last 24 hours: 0/10  Past week: 0/10  How often does the pt experience symptoms? Frequently  How much have the symptoms interfered with usual daily activities? Moderately  How has condition changed since care began at this facility? NA - initial visit  In general, how is the patients overall health? Fair   BACK PAIN (STarT Back Screening Tool) No   Alm DELENA Jenny PT, DPT 04/17/2024 1:40 PM

## 2024-04-18 ENCOUNTER — Ambulatory Visit: Admitting: Physical Therapy

## 2024-04-19 NOTE — Therapy (Signed)
 OUTPATIENT PHYSICAL THERAPY TREATMENT NOTE   Patient Name: Michael Guzman MRN: [id number] DOB:May 30, 1946, 78 y.o., male Today's Date: 04/20/2024  END OF SESSION:  PT End of Session - 04/20/24 1000     Visit Number 6    Number of Visits 16    Date for PT Re-Evaluation 05/25/24    Authorization Time Period Approved 12 visits 03/30/24-05/11/24    Authorization - Visit Number 5    Authorization - Number of Visits 12    Progress Note Due on Visit 10    PT Start Time 1001    PT Stop Time 1040    PT Time Calculation (min) 39 min    Activity Tolerance Patient tolerated treatment well             Past Medical History:  Diagnosis Date   Arthritis    CAD (coronary artery disease)    a.s/p DES to mid LAD and OM2 06/2012.   Chronic back pain    Chronically dry eyes    Diabetes mellitus without complication (HCC)    TYPE 2    DKA (diabetic ketoacidoses) 01/30/2017   Elevated PSA    being monitored   Fatty liver    GERD (gastroesophageal reflux disease)    Hx of radiation therapy    prostate , [facility]    Hypertension    Hypertriglyceridemia    Intermediate coronary syndrome (HCC) 07/26/2012   Nausea vomiting and diarrhea 11/11/2023   Neuromuscular disorder (HCC)    neuropathy feet and legs   Prostate cancer (HCC)    RADTIATION    Past Surgical History:  Procedure Laterality Date   APPENDECTOMY     CARDIAC SURGERY  ~2017   3 stent placed.   COLONOSCOPY  02/12/2010   Buccini   COLONOSCOPY WITH PROPOFOL  N/A 09/21/2022   Procedure: COLONOSCOPY WITH PROPOFOL ;  Surgeon: Mansouraty, Aloha Raddle., MD;  Location: [facility];  Service: Gastroenterology;  Laterality: N/A;   ENDOSCOPIC MUCOSAL RESECTION N/A 09/21/2022   Procedure: ENDOSCOPIC MUCOSAL RESECTION;  Surgeon: Wilhelmenia Aloha Raddle., MD;  Location: [facility];  Service: Gastroenterology;  Laterality: N/A;   HEMOSTASIS CLIP PLACEMENT  09/21/2022   Procedure: HEMOSTASIS CLIP PLACEMENT;  Surgeon:  Wilhelmenia Aloha Raddle., MD;  Location: [facility];  Service: Gastroenterology;;   LEFT HEART CATHETERIZATION WITH CORONARY ANGIOGRAM N/A 06/27/2012   Procedure: LEFT HEART CATHETERIZATION WITH CORONARY ANGIOGRAM;  Surgeon: Oneil Parchment, MD;  Location: [facility] C[facility];  Service: Cardiovascular;  Laterality: N/A;   LEFT HEART CATHETERIZATION WITH CORONARY ANGIOGRAM N/A 07/25/2012   Procedure: LEFT HEART CATHETERIZATION WITH CORONARY ANGIOGRAM;  Surgeon: Wilbert JONELLE Bihari, MD;  Location: [facility]ATH [facility];  Service: Cardiovascular;  Laterality: N/A;   PROSTATE BIOPSY     RADIOLOGY WITH ANESTHESIA N/A 12/03/2015   Procedure: MRI LUMBAR SPINE;  Surgeon: Medication Radiologist, MD;  Location: [facility];  Service: Radiology;  Laterality: N/A;   SUBMUCOSAL LIFTING INJECTION  09/21/2022   Procedure: SUBMUCOSAL LIFTING INJECTION;  Surgeon: Wilhelmenia Aloha Raddle., MD;  Location: [facility];  Service: Gastroenterology;;   TOTAL HIP ARTHROPLASTY Right 04/28/2019   Procedure: TOTAL HIP ARTHROPLASTY ANTERIOR APPROACH;  Surgeon: Yvone Rush, MD;  Location: [facility];  Service: Orthopedics;  Laterality: Right;   Patient Active Problem List   Diagnosis Date Noted   Anemia 02/28/2024   Encounter for Medicare annual wellness exam 12/09/2023   Depression, recurrent (HCC) 11/22/2023   Hyponatremia 11/11/2023   Adenomatous polyp of transverse colon 06/18/2022   Hx of adenomatous colonic  polyps 06/18/2022   Diaphoresis 08/04/2021   Hypersomnia with sleep apnea 08/04/2021   Excessive daytime sleepiness 08/04/2021   Excessive postexertional fatigue 08/04/2021   Diabetes mellitus with coincident hypertension (HCC) 06/17/2021   Hypoglycemia due to insulin  01/11/2021   Primary osteoarthritis of hip 04/30/2019   Former smoker 02/02/2019   Fatty infiltration of liver 11/30/2018   Hypertriglyceridemia    Microalbuminuric diabetic nephropathy (HCC) 06/01/2018   Aortic atherosclerosis (HCC) by Abd CT scan 2015 05/30/2018   Type 2  diabetes mellitus with vascular disease (HCC) 01/31/2018   Thyroiditis 05/12/2016   Malignant neoplasm of prostate (HCC) 05/05/2016   Diabetic peripheral neuropathy (HCC) 03/06/2015   Medication management 07/24/2014   Vitamin D  deficiency 03/06/2014   Hyperlipidemia associated with type 2 diabetes mellitus (HCC) 06/28/2012   ASCAD s/p PTCA (06/2012) 06/27/2012   Hypertension (1991) 06/25/2012   GERD 06/25/2012    PCP: [name], [name]  REFERRING PROVIDER:  [name], DO   REFERRING DIAG: R26.89 (ICD-10-CM) - Other abnormalities of gait and mobility   THERAPY DIAG:  Muscle weakness (generalized)  Difficulty in walking, not elsewhere classified  Rationale for Evaluation and Treatment: Rehabilitation  ONSET DATE: 03/05/2024  SUBJECTIVE:   SUBJECTIVE STATEMENT: 04/20/2024: states he did well after last session, went home and worked in the yard. Feeling pretty good today. Endorses some LE stiffness that improves w/ activity.    EVAL: Patient reports that he has had this weakness for 5 years. He recalls going to the hospital 3x in the past 2 months, and states that they just discovered his sodium was low during hospitalization 03/05/2024. He states that had PT following his hip replacement in 2020, but had a short POC opting to work on HEP at home. He states that he gets out of breath easily. He sometimes walks up the hill to the mailbox and gets short of breath. He also tends to the garden daily. He states that he only uses his cane for community ambulation.   Patient had recent hospitalization d/t hyponatremia, HTN; physical debilitation muscle weakness  PERTINENT HISTORY: Relevant PMHx includes arthritis, CAD, DM type II, DKA, HTN, LE neuropathy, R THA (2020), Anemia, Depression/Grief (his wife passed away 2 years ago)  PAIN:  Are you having pain? No  PRECAUTIONS: None  RED FLAGS: None   WEIGHT BEARING RESTRICTIONS: No  FALLS:  Has patient fallen in last 6  months? Yes. Number of falls 1; I was carrying groceries upstairs and just kneeled down after my foot got caught on the last step   LIVING ENVIRONMENT: Lives with: lives alone; his children come over daily.  Lives in: House/apartment Stairs: Yes: External: 3 steps; bilateral but cannot reach both; I don't use the front stairs, because there are no railing on that one. Has following equipment at home: Quad cane small base  OCCUPATION: n/a   PLOF: Independent, Independent with household mobility without device, and Independent with community mobility without device  PATIENT GOALS: To improve strength and endurance if possible.   NEXT MD VISIT: 04/05/2024 with PCP   OBJECTIVE:  Note: Objective measures were completed at Evaluation unless otherwise noted.  DIAGNOSTIC FINDINGS:   No relevant imaging results available in epic; none included in referral    PATIENT SURVEYS:   THE PATIENT SPECIFIC FUNCTIONAL SCALE  Place score of 0-10 (0 = unable to perform activity and 10 = able to perform activity at the same level as before injury or problem)  Activity Date: 03/30/24  Walk (>20 min) 8    2.Heavy Lifting (ie. Top soil)  5    3. Stairs 5    4.      Total Score 6      Total Score = Sum of activity scores/number of activities  Minimally Detectable Change: 3 points (for single activity); 2 points (for average score)  [facility] (nd). The Patient Specific Functional Scale . Retrieved from [email or web address]    COGNITION: Overall cognitive status: Within functional limits for tasks assessed      LOWER EXTREMITY MMT:  MMT Right eval Left eval  Hip flexion 4+ 4+  Hip extension    Hip abduction 4+ 4+  Hip adduction 4 4  Hip internal rotation    Hip external rotation    Knee flexion 5 5  Knee extension    Ankle dorsiflexion    Ankle plantarflexion    Ankle inversion    Ankle eversion     (Blank  rows = not tested)   FUNCTIONAL TESTS:  2 minute walk test: 255 ft; use of quad cane for initial 30 seconds of test.   GAIT: Distance walked: 255 ft (see above) Assistive device utilized: Quad cane small base Level of assistance: Complete Independence and Modified independence Comments: ambulates with R>L stance time, steady gait speed                                                                                                                                TREATMENT DATE:   [facility] Adult PT Treatment:                                                DATE: 04/20/24 Therapeutic Exercise: Nu step L3 LE/UE during subjective  Green band hip abduction x10 BIL Green band hip extension x10 BIL cues for posture 3-3-3 tempo heel raise x15 HEP update + education/handout  Therapeutic Activity: STS x10 BW cues for weight shifting STS 3kg ball + fwd chest press 2x8 cues for trunk mechanics     [facility] Adult PT Treatment:                                                DATE: 04/13/24 Therapeutic Exercise: Nu step L5 LE/UE during subjective Green band hip abd (band at knee) 2x8 BIL cues for posture  Heel raises x12 at counter Standing march x8 BIL Green band hamstring curl x15 BIL LE  Therapeutic Activity: STS+ 5kg OH press x5 STS 2x12 Pacing of tasks, education/discussion re: fatigue management and activity modification    [facility] Adult PT Treatment:  DATE: 04/11/2024  Therapeutic Exercise: Nustep level 3 x 8 mins to address aerobic capacity Heel raises 2x10 Seated hip adduction 5 hold 2x10 Cybex hip add/abd/ext/flex 17.5# x10 ea BIL  Therapeutic Activity: Step ups 6 fwd/lat x10 ea BIL STS from chair holding 4kg ball with overhead press to upright posture 3x5 Rows standing on Airex GTB x10   [facility] Adult PT Treatment:                                                DATE: 04/06/24 Therapeutic Exercise: Nustep level 3 x 8  mins to address aerobic capacity Heel raises 2x10 Seated hip adduction 5 hold 2x10 Cybex hip abd/ext/flex 17.5# x10 ea BIL Therapeutic Activity: Standing marching 3x30 single UE support Step ups 6 fwd/lat x10 ea BIL STS from chair holding 5# KB x10 - cues for set up and form, controlling descent (next session with weighted ball to [facility] press to promote upright posture) Rows standing on Airex GTB 2x10    PATIENT EDUCATION:  Education details: rationale for interventions, HEP  Person educated: Patient Education method: Explanation, Demonstration, Tactile cues, Verbal cues Education comprehension: verbalized understanding, returned demonstration, verbal cues required, tactile cues required, and needs further education     HOME EXERCISE PROGRAM: Access Code: [email or web address] URL: [email or web address] Date: 04/20/2024 Prepared by: Alm Jenny  Exercises - Heel Raises with Counter Support  - 1 x daily - 7 x weekly - 2 sets - 10 reps - Standing Marching  - 1 x daily - 7 x weekly - 2 sets - 10 reps - Sit to Stand  - 1 x daily - 7 x weekly - 2 sets - 10 reps - Standing Hip Abduction with Counter Support  - 1 x daily - 7 x weekly - 2 sets - 8 reps - Standing Hip Extension with Counter Support  - 1 x daily - 7 x weekly - 2 sets - 8 reps   ASSESSMENT:  CLINICAL IMPRESSION: 04/20/2024: Pt arrives w/ report of feeling pretty good today, no issues after last session. Today continuing to progress generalized strengthening and activity tolerance tasks as above. No adverse events, rest breaks PRN, cues as above.  Tolerates session well overall with report of improved energy levels compared to last session. Recommend continuing along current POC in order to address relevant deficits and improve functional tolerance. Pt departs today's session in no acute distress, all voiced questions/concerns addressed appropriately from PT perspective.     EVAL: Michael Guzman is a 78 y.o. male who was seen  today for physical therapy evaluation and treatment for BIL LE weakness following recent hospitalization. He is demonstrating decreased LE MMT BIL, decreased gait speed, and intermittent use of cane with ambulation. He has related pain and difficulty with long periods of walking, heavy lifting as related to household and yardwork responsibilities, and navigating stiars. He requires skilled PT services at this time to address relevant deficits and improve overall function.     OBJECTIVE IMPAIRMENTS: Abnormal gait, decreased endurance, and decreased strength.   ACTIVITY LIMITATIONS: carrying, lifting, standing, squatting, and stairs  PARTICIPATION LIMITATIONS: interpersonal relationship, community activity, and yard work  PERSONAL FACTORS: Age, Past/current experiences, Time since onset of injury/illness/exacerbation, and 3+ comorbidities: Relevant PMHx includes arthritis, CAD, DM type II, DKA, HTN, LE neuropathy, R THA (2020), Anemia, Depression are also affecting patient's functional outcome.  REHAB POTENTIAL: Fair    CLINICAL DECISION MAKING: Evolving/moderate complexity  EVALUATION COMPLEXITY: Moderate   GOALS: Goals reviewed with patient? YES  SHORT TERM GOALS: Target date: 04/27/2024   Patient will be independent with initial home program at least 3 days/week.  Baseline: provided at eval Goal Status: INITIAL   2.  Patient will demonstrate at least 4+/5 MMT hip adduction strength BIL.  Baseline: see objective measures Goal Status: INITIAL     LONG TERM GOALS: Target date: 05/25/2024   Patient will report improved overall functional ability with PSFS score of 8 or greater.  Baseline: 6 Goal Status: INITIAL    2.  Patient will demonstrate ability to safely ascend/descend 8 step height at least 20 times.  Baseline: limited d/t mm fatigue  Goal status: INITIAL  3.  Patient will demonstrate ability to perform floor to waist lifting of at least 25# x 10 repetitions, using  appropriate body mechanics and with no more than minimal pain in order to safely perform normal daily/occupational tasks.   Baseline:   Goal Status: INITIAL   4.  Patient will ambulate 300 ft or more during , in order to demonstrate diminished fall risk and improved community ambulation.  Baseline: 233ft (MDC: 40 ft)  Goal status: INITIAL     PLAN:  PT FREQUENCY: 1-2x/week  PT DURATION: 8 weeks  PLANNED INTERVENTIONS: 02835- PT Re-evaluation, 97750- Physical Performance Testing, 97110-Therapeutic exercises, 97530- Therapeutic activity, 97112- Neuromuscular re-education, 97535- Self Care, 02859- Manual therapy, [phone]- Aquatic Therapy, Patient/Family education, Balance training, Stair training, Cryotherapy, and Moist heat  PLAN FOR NEXT SESSION: LE strengthening program, use of nustep to address aerobic capacity, further functional tests as indicated for static/dynamic balance and address related deficits as indicated   Date of referral: 03/10/2024 Referring provider:  [name], DO    Referring diagnosis? R26.89 (ICD-10-CM) - Other abnormalities of gait and mobility  Treatment diagnosis? (if different than referring diagnosis)  Muscle weakness (generalized) Difficulty in walking, not elsewhere classified  What was this (referring dx) caused by? Ongoing Issue  Lysle of Condition: Chronic (continuous duration > 3 months)   Laterality: Both  Current Functional Measure Score: Patient Specific Functional Scale 6/10  Objective measurements identify impairments when they are compared to normal values, the uninvolved extremity, and prior level of function.  [x]  Yes  []  No  Objective assessment of functional ability: Moderate functional limitations   Briefly describe symptoms: BIL LE weakness, decreased endurance with daily activities  How did symptoms start: gradual onset, without known MOI. Patient has hx of R THA  Average pain intensity:  Last 24 hours:  0/10  Past week: 0/10  How often does the pt experience symptoms? Frequently  How much have the symptoms interfered with usual daily activities? Moderately  How has condition changed since care began at this facility? NA - initial visit  In general, how is the patients overall health? Fair   BACK PAIN (STarT Back Screening Tool) No   Alm DELENA Jenny PT, DPT 04/20/2024 10:41 AM

## 2024-04-20 ENCOUNTER — Encounter: Payer: Self-pay | Admitting: Physical Therapy

## 2024-04-20 ENCOUNTER — Ambulatory Visit: Attending: Internal Medicine | Admitting: Physical Therapy

## 2024-04-20 DIAGNOSIS — M6281 Muscle weakness (generalized): Secondary | ICD-10-CM | POA: Insufficient documentation

## 2024-04-20 DIAGNOSIS — R262 Difficulty in walking, not elsewhere classified: Secondary | ICD-10-CM | POA: Diagnosis not present

## 2024-04-24 ENCOUNTER — Other Ambulatory Visit: Payer: Self-pay | Admitting: Family Medicine

## 2024-04-24 ENCOUNTER — Other Ambulatory Visit: Payer: Self-pay

## 2024-04-24 ENCOUNTER — Telehealth: Payer: Self-pay

## 2024-04-24 DIAGNOSIS — K219 Gastro-esophageal reflux disease without esophagitis: Secondary | ICD-10-CM

## 2024-04-24 DIAGNOSIS — R61 Generalized hyperhidrosis: Secondary | ICD-10-CM

## 2024-04-24 MED ORDER — BLOOD GLUCOSE MONITORING SUPPL DEVI: 1.0000 | Freq: Three times a day (TID) | 0 refills | Status: AC

## 2024-04-24 MED ORDER — GLYCOPYRROLATE 2 MG PO TABS: ORAL_TABLET | ORAL | 3 refills | Status: AC

## 2024-04-24 MED ORDER — LANCETS MISC. MISC: 1.0000 | Freq: Three times a day (TID) | 0 refills | Status: DC

## 2024-04-24 MED ORDER — LANCET DEVICE MISC: 1.0000 | Freq: Three times a day (TID) | 0 refills | Status: AC

## 2024-04-24 MED ORDER — BLOOD GLUCOSE TEST VI STRP: 1.0000 | ORAL_STRIP | Freq: Three times a day (TID) | 0 refills | Status: AC

## 2024-04-24 NOTE — Telephone Encounter (Signed)
 Copied from CRM [id number]. Topic: Clinical - Medication Prior Auth >> Apr 24, 2024  8:54 AM Treva T wrote: Reason for CRM: Received call from patient, states medication, glycopyrrolate  (ROBINUL ) 2 MG tablet, was recently denied, but per patient received a letter from insurance, [facility], approving medication.  Patient is inquiring if office received approval. Medication was approved after further review, Authorization Approval # [id number].  Can be reached at [phone] to follow up if have any additional questions, or need to discuss further.   Pharmacy:  [facility] - [place], [place] - [address] [place] [address] Phone: [phone] Fax: [phone]

## 2024-04-24 NOTE — Telephone Encounter (Signed)
 Copied from CRM [id number]. Topic: Clinical - Medication Prior Auth >> Apr 24, 2024  8:54 AM Treva T wrote: Reason for CRM: Received call from patient, states medication, glycopyrrolate  (ROBINUL ) 2 MG tablet, was recently denied, but per patient received a letter from insurance, [facility], approving medication.  Patient is inquiring if office received approval. Medication was approved after further review, Authorization Approval # [id number].  Can be reached at [phone] to follow up if have any additional questions, or need to discuss further.   Pharmacy:  [facility] - [place], [place] - [address] [place] [address] Phone: [phone] Fax: [phone]

## 2024-04-25 ENCOUNTER — Ambulatory Visit

## 2024-04-25 DIAGNOSIS — R262 Difficulty in walking, not elsewhere classified: Secondary | ICD-10-CM

## 2024-04-25 DIAGNOSIS — M6281 Muscle weakness (generalized): Secondary | ICD-10-CM | POA: Diagnosis not present

## 2024-04-25 NOTE — Therapy (Signed)
 OUTPATIENT PHYSICAL THERAPY TREATMENT NOTE   Patient Name: Michael Guzman MRN: [id number] DOB:09-12-1946, 78 y.o., male Today's Date: 04/25/2024  END OF SESSION:  PT End of Session - 04/25/24 1048     Visit Number 7    Number of Visits 16    Date for PT Re-Evaluation 05/25/24    Authorization Type [employer] MCR    Authorization Time Period Approved 12 visits 03/30/24-05/11/24    Authorization - Visit Number 6    Authorization - Number of Visits 12    Progress Note Due on Visit 10    PT Start Time 1048    PT Stop Time 1128    PT Time Calculation (min) 40 min    Activity Tolerance Patient tolerated treatment well    Behavior During Therapy [facility] for tasks assessed/performed              Past Medical History:  Diagnosis Date   Arthritis    CAD (coronary artery disease)    a.s/p DES to mid LAD and OM2 06/2012.   Chronic back pain    Chronically dry eyes    Diabetes mellitus without complication (HCC)    TYPE 2    DKA (diabetic ketoacidoses) 01/30/2017   Elevated PSA    being monitored   Fatty liver    GERD (gastroesophageal reflux disease)    Hx of radiation therapy    prostate , [facility]    Hypertension    Hypertriglyceridemia    Intermediate coronary syndrome (HCC) 07/26/2012   Nausea vomiting and diarrhea 11/11/2023   Neuromuscular disorder (HCC)    neuropathy feet and legs   Prostate cancer (HCC)    RADTIATION    Past Surgical History:  Procedure Laterality Date   APPENDECTOMY     CARDIAC SURGERY  ~2017   3 stent placed.   COLONOSCOPY  02/12/2010   Buccini   COLONOSCOPY WITH PROPOFOL  N/A 09/21/2022   Procedure: COLONOSCOPY WITH PROPOFOL ;  Surgeon: Wilhelmenia Aloha Raddle., MD;  Location: [facility];  Service: Gastroenterology;  Laterality: N/A;   ENDOSCOPIC MUCOSAL RESECTION N/A 09/21/2022   Procedure: ENDOSCOPIC MUCOSAL RESECTION;  Surgeon: Wilhelmenia Aloha Raddle., MD;  Location: [facility];  Service: Gastroenterology;  Laterality: N/A;    HEMOSTASIS CLIP PLACEMENT  09/21/2022   Procedure: HEMOSTASIS CLIP PLACEMENT;  Surgeon: Wilhelmenia Aloha Raddle., MD;  Location: [facility];  Service: Gastroenterology;;   LEFT HEART CATHETERIZATION WITH CORONARY ANGIOGRAM N/A 06/27/2012   Procedure: LEFT HEART CATHETERIZATION WITH CORONARY ANGIOGRAM;  Surgeon: Oneil Parchment, MD;  Location: [facility] CATH LAB;  Service: Cardiovascular;  Laterality: N/A;   LEFT HEART CATHETERIZATION WITH CORONARY ANGIOGRAM N/A 07/25/2012   Procedure: LEFT HEART CATHETERIZATION WITH CORONARY ANGIOGRAM;  Surgeon: Wilbert JONELLE Bihari, MD;  Location: [facility] CATH LAB;  Service: Cardiovascular;  Laterality: N/A;   PROSTATE BIOPSY     RADIOLOGY WITH ANESTHESIA N/A 12/03/2015   Procedure: MRI LUMBAR SPINE;  Surgeon: Medication Radiologist, MD;  Location: [facility] OR;  Service: Radiology;  Laterality: N/A;   SUBMUCOSAL LIFTING INJECTION  09/21/2022   Procedure: SUBMUCOSAL LIFTING INJECTION;  Surgeon: Wilhelmenia Aloha Raddle., MD;  Location: [facility];  Service: Gastroenterology;;   TOTAL HIP ARTHROPLASTY Right 04/28/2019   Procedure: TOTAL HIP ARTHROPLASTY ANTERIOR APPROACH;  Surgeon: Yvone Rush, MD;  Location: [facility];  Service: Orthopedics;  Laterality: Right;   Patient Active Problem List   Diagnosis Date Noted   Anemia 02/28/2024   Encounter for Medicare annual wellness exam 12/09/2023   Depression, recurrent (HCC) 11/22/2023  Hyponatremia 11/11/2023   Adenomatous polyp of transverse colon 06/18/2022   Hx of adenomatous colonic polyps 06/18/2022   Diaphoresis 08/04/2021   Hypersomnia with sleep apnea 08/04/2021   Excessive daytime sleepiness 08/04/2021   Excessive postexertional fatigue 08/04/2021   Diabetes mellitus with coincident hypertension (HCC) 06/17/2021   Hypoglycemia due to insulin  01/11/2021   Primary osteoarthritis of hip 04/30/2019   Former smoker 02/02/2019   Fatty infiltration of liver 11/30/2018   Hypertriglyceridemia    Microalbuminuric diabetic nephropathy (HCC)  06/01/2018   Aortic atherosclerosis (HCC) by Abd CT scan 2015 05/30/2018   Type 2 diabetes mellitus with vascular disease (HCC) 01/31/2018   Thyroiditis 05/12/2016   Malignant neoplasm of prostate (HCC) 05/05/2016   Diabetic peripheral neuropathy (HCC) 03/06/2015   Medication management 07/24/2014   Vitamin D  deficiency 03/06/2014   Hyperlipidemia associated with type 2 diabetes mellitus (HCC) 06/28/2012   ASCAD s/p PTCA (06/2012) 06/27/2012   Hypertension (1991) 06/25/2012   GERD 06/25/2012    PCP: [name], [name]  REFERRING PROVIDER:  [name], DO   REFERRING DIAG: R26.89 (ICD-10-CM) - Other abnormalities of gait and mobility   THERAPY DIAG:  Muscle weakness (generalized)  Difficulty in walking, not elsewhere classified  Rationale for Evaluation and Treatment: Rehabilitation  ONSET DATE: 03/05/2024  SUBJECTIVE:   SUBJECTIVE STATEMENT: 04/25/2024: states he did well after last session, went home and worked in the yard. Feeling pretty good today. Endorses some LE stiffness that improves w/ activity.    EVAL: Patient reports that he has had this weakness for 5 years. He recalls going to the hospital 3x in the past 2 months, and states that they just discovered his sodium was low during hospitalization 03/05/2024. He states that had PT following his hip replacement in 2020, but had a short POC opting to work on HEP at home. He states that he gets out of breath easily. He sometimes walks up the hill to the mailbox and gets short of breath. He also tends to the garden daily. He states that he only uses his cane for community ambulation.   Patient had recent hospitalization d/t hyponatremia, HTN; physical debilitation muscle weakness  PERTINENT HISTORY: Relevant PMHx includes arthritis, CAD, DM type II, DKA, HTN, LE neuropathy, R THA (2020), Anemia, Depression/Grief (his wife passed away 2 years ago)  PAIN:  Are you having pain? No  PRECAUTIONS: None  RED  FLAGS: None   WEIGHT BEARING RESTRICTIONS: No  FALLS:  Has patient fallen in last 6 months? Yes. Number of falls 1; I was carrying groceries upstairs and just kneeled down after my foot got caught on the last step   LIVING ENVIRONMENT: Lives with: lives alone; his children come over daily.  Lives in: House/apartment Stairs: Yes: External: 3 steps; bilateral but cannot reach both; I don't use the front stairs, because there are no railing on that one. Has following equipment at home: Quad cane small base  OCCUPATION: n/a   PLOF: Independent, Independent with household mobility without device, and Independent with community mobility without device  PATIENT GOALS: To improve strength and endurance if possible.   NEXT MD VISIT: 04/05/2024 with PCP   OBJECTIVE:  Note: Objective measures were completed at Evaluation unless otherwise noted.  DIAGNOSTIC FINDINGS:   No relevant imaging results available in epic; none included in referral    PATIENT SURVEYS:   THE PATIENT SPECIFIC FUNCTIONAL SCALE  Place score of 0-10 (0 = unable to perform activity and 10 = able to  perform activity at the same level as before injury or problem)  Activity Date: 03/30/24    Walk (>20 min) 8    2.Heavy Lifting (ie. Top soil)  5    3. Stairs 5    4.      Total Score 6      Total Score = Sum of activity scores/number of activities  Minimally Detectable Change: 3 points (for single activity); 2 points (for average score)  [facility] (nd). The Patient Specific Functional Scale . Retrieved from [email or web address]    COGNITION: Overall cognitive status: Within functional limits for tasks assessed      LOWER EXTREMITY MMT:  MMT Right eval Left eval  Hip flexion 4+ 4+  Hip extension    Hip abduction 4+ 4+  Hip adduction 4 4  Hip internal rotation    Hip external rotation    Knee flexion 5 5  Knee extension    Ankle  dorsiflexion    Ankle plantarflexion    Ankle inversion    Ankle eversion     (Blank rows = not tested)   FUNCTIONAL TESTS:  2 minute walk test: 255 ft; use of quad cane for initial 30 seconds of test.   GAIT: Distance walked: 255 ft (see above) Assistive device utilized: Quad cane small base Level of assistance: Complete Independence and Modified independence Comments: ambulates with R>L stance time, steady gait speed                                                                                                                                TREATMENT DATE:   [facility] Adult PT Treatment:                                                DATE: 04/25/2024  Therapeutic Exercise: Nu step L3 LE/UE during subjective  Green band hip abduction x12 BIL Green band hip extension x10 BIL cues for posture 3-3-3 tempo heel raise 2 x 10  Therapeutic Activity: STS x10 BW cues for weight shifting STS 3kg ball + fwd chest press x8 STS 3kg ball + overhead press x8  Step ups 6 steps with single UE support, x 10 each   [facility] Adult PT Treatment:                                                DATE: 04/20/24  Therapeutic Exercise: Nu step L3 LE/UE during subjective  Green band hip abduction x10 BIL Green band hip extension x10 BIL cues for posture 3-3-3 tempo heel raise x15 HEP update + education/handout  Therapeutic Activity: STS x10 BW cues for weight shifting  STS 3kg ball + fwd chest press 2x8 cues for trunk mechanics     [facility] Adult PT Treatment:                                                DATE: 04/13/24 Therapeutic Exercise: Nu step L5 LE/UE during subjective Green band hip abd (band at knee) 2x8 BIL cues for posture  Heel raises x12 at counter Standing march x8 BIL Green band hamstring curl x15 BIL LE  Therapeutic Activity: STS+ 5kg OH press x5 STS 2x12 Pacing of tasks, education/discussion re: fatigue management and activity modification    [facility] Adult  PT Treatment:                                                DATE: 04/11/2024  Therapeutic Exercise: Nustep level 3 x 8 mins to address aerobic capacity Heel raises 2x10 Seated hip adduction 5 hold 2x10 Cybex hip add/abd/ext/flex 17.5# x10 ea BIL  Therapeutic Activity: Step ups 6 fwd/lat x10 ea BIL STS from chair holding 4kg ball with overhead press to upright posture 3x5 Rows standing on Airex GTB x10   [facility] Adult PT Treatment:                                                DATE: 04/06/24 Therapeutic Exercise: Nustep level 3 x 8 mins to address aerobic capacity Heel raises 2x10 Seated hip adduction 5 hold 2x10 Cybex hip abd/ext/flex 17.5# x10 ea BIL Therapeutic Activity: Standing marching 3x30 single UE support Step ups 6 fwd/lat x10 ea BIL STS from chair holding 5# KB x10 - cues for set up and form, controlling descent (next session with weighted ball to [facility] press to promote upright posture) Rows standing on Airex GTB 2x10    PATIENT EDUCATION:  Education details: rationale for interventions, HEP  Person educated: Patient Education method: Explanation, Demonstration, Tactile cues, Verbal cues Education comprehension: verbalized understanding, returned demonstration, verbal cues required, tactile cues required, and needs further education     HOME EXERCISE PROGRAM: Access Code: [email or web address] URL: [email or web address] Date: 04/20/2024 Prepared by: Alm Jenny  Exercises - Heel Raises with Counter Support  - 1 x daily - 7 x weekly - 2 sets - 10 reps - Standing Marching  - 1 x daily - 7 x weekly - 2 sets - 10 reps - Sit to Stand  - 1 x daily - 7 x weekly - 2 sets - 10 reps - Standing Hip Abduction with Counter Support  - 1 x daily - 7 x weekly - 2 sets - 8 reps - Standing Hip Extension with Counter Support  - 1 x daily - 7 x weekly - 2 sets - 8 reps   ASSESSMENT:  CLINICAL IMPRESSION: 04/25/2024: Michael Guzman had good tolerance of today's treatment session,  with ongoing progression towards established rehab goals. Patient had some fatigue with step ups as well as sit to stand with chest and OH press. Plan is to continue with functional activities and assess progress towards goals at remaining visit. We will also update HEP and discharge from  PT at next visit.    EVAL: Michael Guzman is a 78 y.o. male who was seen today for physical therapy evaluation and treatment for BIL LE weakness following recent hospitalization. He is demonstrating decreased LE MMT BIL, decreased gait speed, and intermittent use of cane with ambulation. He has related pain and difficulty with long periods of walking, heavy lifting as related to household and yardwork responsibilities, and navigating stiars. He requires skilled PT services at this time to address relevant deficits and improve overall function.     OBJECTIVE IMPAIRMENTS: Abnormal gait, decreased endurance, and decreased strength.   ACTIVITY LIMITATIONS: carrying, lifting, standing, squatting, and stairs  PARTICIPATION LIMITATIONS: interpersonal relationship, community activity, and yard work  PERSONAL FACTORS: Age, Past/current experiences, Time since onset of injury/illness/exacerbation, and 3+ comorbidities: Relevant PMHx includes arthritis, CAD, DM type II, DKA, HTN, LE neuropathy, R THA (2020), Anemia, Depression are also affecting patient's functional outcome.   REHAB POTENTIAL: Fair    CLINICAL DECISION MAKING: Evolving/moderate complexity  EVALUATION COMPLEXITY: Moderate   GOALS: Goals reviewed with patient? YES  SHORT TERM GOALS: Target date: 04/27/2024   Patient will be independent with initial home program at least 3 days/week.  Baseline: provided at eval Goal Status: INITIAL   2.  Patient will demonstrate at least 4+/5 MMT hip adduction strength BIL.  Baseline: see objective measures Goal Status: INITIAL     LONG TERM GOALS: Target date: 05/25/2024   Patient will report improved overall  functional ability with PSFS score of 8 or greater.  Baseline: 6 Goal Status: INITIAL    2.  Patient will demonstrate ability to safely ascend/descend 8 step height at least 20 times.  Baseline: limited d/t mm fatigue  Goal status: INITIAL  3.  Patient will demonstrate ability to perform floor to waist lifting of at least 25# x 10 repetitions, using appropriate body mechanics and with no more than minimal pain in order to safely perform normal daily/occupational tasks.   Baseline:   Goal Status: INITIAL   4.  Patient will ambulate 300 ft or more during , in order to demonstrate diminished fall risk and improved community ambulation.  Baseline: 230ft (MDC: 40 ft)  Goal status: INITIAL     PLAN:  PT FREQUENCY: 1-2x/week  PT DURATION: 8 weeks  PLANNED INTERVENTIONS: 02835- PT Re-evaluation, 97750- Physical Performance Testing, 97110-Therapeutic exercises, 97530- Therapeutic activity, 97112- Neuromuscular re-education, 97535- Self Care, [id number]- Manual therapy, [phone]- Aquatic Therapy, Patient/Family education, Balance training, Stair training, Cryotherapy, and Moist heat  PLAN FOR NEXT SESSION: LE strengthening program, use of nustep to address aerobic capacity, further functional tests as indicated for static/dynamic balance and address related deficits as indicated   Date of referral: 03/10/2024 Referring provider:  [name], DO    Referring diagnosis? R26.89 (ICD-10-CM) - Other abnormalities of gait and mobility  Treatment diagnosis? (if different than referring diagnosis)  Muscle weakness (generalized) Difficulty in walking, not elsewhere classified  What was this (referring dx) caused by? Ongoing Issue  Lysle of Condition: Chronic (continuous duration > 3 months)   Laterality: Both  Current Functional Measure Score: Patient Specific Functional Scale 6/10  Objective measurements identify impairments when they are compared to normal values, the uninvolved  extremity, and prior level of function.  [x]  Yes  []  No  Objective assessment of functional ability: Moderate functional limitations   Briefly describe symptoms: BIL LE weakness, decreased endurance with daily activities  How did symptoms start: gradual onset, without known MOI. Patient has hx of R  THA  Average pain intensity:  Last 24 hours: 0/10  Past week: 0/10  How often does the pt experience symptoms? Frequently  How much have the symptoms interfered with usual daily activities? Moderately  How has condition changed since care began at this facility? NA - initial visit  In general, how is the patients overall health? Fair   BACK PAIN (STarT Back Screening Tool) No   Marko Molt, PT, DPT  04/25/2024 11:35 AM

## 2024-04-26 NOTE — Therapy (Signed)
 OUTPATIENT PHYSICAL THERAPY TREATMENT NOTE   Patient Name: Michael Guzman MRN: [id number] DOB:1945-12-03, 78 y.o., male Today's Date: 04/27/2024  END OF SESSION:  PT End of Session - 04/27/24 0900     Visit Number 8    Number of Visits 16    Date for PT Re-Evaluation 05/25/24    Authorization Type UHC MCR    Authorization Time Period Approved 12 visits 03/30/24-05/11/24    Authorization - Visit Number 7    Authorization - Number of Visits 12    Progress Note Due on Visit 10    PT Start Time 0915    PT Stop Time 0953    PT Time Calculation (min) 38 min    Activity Tolerance Patient tolerated treatment well    Behavior During Therapy [facility] for tasks assessed/performed         Past Medical History:  Diagnosis Date   Arthritis    CAD (coronary artery disease)    a.s/p DES to mid LAD and OM2 06/2012.   Chronic back pain    Chronically dry eyes    Diabetes mellitus without complication (HCC)    TYPE 2    DKA (diabetic ketoacidoses) 01/30/2017   Elevated PSA    being monitored   Fatty liver    GERD (gastroesophageal reflux disease)    Hx of radiation therapy    prostate , [facility]    Hypertension    Hypertriglyceridemia    Intermediate coronary syndrome (HCC) 07/26/2012   Nausea vomiting and diarrhea 11/11/2023   Neuromuscular disorder (HCC)    neuropathy feet and legs   Prostate cancer (HCC)    RADTIATION    Past Surgical History:  Procedure Laterality Date   APPENDECTOMY     CARDIAC SURGERY  ~2017   3 stent placed.   COLONOSCOPY  02/12/2010   Buccini   COLONOSCOPY WITH PROPOFOL  N/A 09/21/2022   Procedure: COLONOSCOPY WITH PROPOFOL ;  Surgeon: Mansouraty, Aloha Raddle., MD;  Location: [facility];  Service: Gastroenterology;  Laterality: N/A;   ENDOSCOPIC MUCOSAL RESECTION N/A 09/21/2022   Procedure: ENDOSCOPIC MUCOSAL RESECTION;  Surgeon: Wilhelmenia Aloha Raddle., MD;  Location: [facility];  Service: Gastroenterology;  Laterality: N/A;    HEMOSTASIS CLIP PLACEMENT  09/21/2022   Procedure: HEMOSTASIS CLIP PLACEMENT;  Surgeon: Wilhelmenia Aloha Raddle., MD;  Location: [facility];  Service: Gastroenterology;;   LEFT HEART CATHETERIZATION WITH CORONARY ANGIOGRAM N/A 06/27/2012   Procedure: LEFT HEART CATHETERIZATION WITH CORONARY ANGIOGRAM;  Surgeon: Oneil Parchment, MD;  Location: [facility] CATH LAB;  Service: Cardiovascular;  Laterality: N/A;   LEFT HEART CATHETERIZATION WITH CORONARY ANGIOGRAM N/A 07/25/2012   Procedure: LEFT HEART CATHETERIZATION WITH CORONARY ANGIOGRAM;  Surgeon: Wilbert JONELLE Bihari, MD;  Location: [facility] CATH LAB;  Service: Cardiovascular;  Laterality: N/A;   PROSTATE BIOPSY     RADIOLOGY WITH ANESTHESIA N/A 12/03/2015   Procedure: MRI LUMBAR SPINE;  Surgeon: Medication Radiologist, MD;  Location: [facility];  Service: Radiology;  Laterality: N/A;   SUBMUCOSAL LIFTING INJECTION  09/21/2022   Procedure: SUBMUCOSAL LIFTING INJECTION;  Surgeon: Wilhelmenia Aloha Raddle., MD;  Location: [facility];  Service: Gastroenterology;;   TOTAL HIP ARTHROPLASTY Right 04/28/2019   Procedure: TOTAL HIP ARTHROPLASTY ANTERIOR APPROACH;  Surgeon: Yvone Rush, MD;  Location: [facility];  Service: Orthopedics;  Laterality: Right;   Patient Active Problem List   Diagnosis Date Noted   Anemia 02/28/2024   Encounter for Medicare annual wellness exam 12/09/2023   Depression, recurrent (HCC) 11/22/2023   Hyponatremia 11/11/2023  Adenomatous polyp of transverse colon 06/18/2022   Hx of adenomatous colonic polyps 06/18/2022   Diaphoresis 08/04/2021   Hypersomnia with sleep apnea 08/04/2021   Excessive daytime sleepiness 08/04/2021   Excessive postexertional fatigue 08/04/2021   Diabetes mellitus with coincident hypertension (HCC) 06/17/2021   Hypoglycemia due to insulin  01/11/2021   Primary osteoarthritis of hip 04/30/2019   Former smoker 02/02/2019   Fatty infiltration of liver 11/30/2018   Hypertriglyceridemia    Microalbuminuric diabetic nephropathy (HCC)  06/01/2018   Aortic atherosclerosis (HCC) by Abd CT scan 2015 05/30/2018   Type 2 diabetes mellitus with vascular disease (HCC) 01/31/2018   Thyroiditis 05/12/2016   Malignant neoplasm of prostate (HCC) 05/05/2016   Diabetic peripheral neuropathy (HCC) 03/06/2015   Medication management 07/24/2014   Vitamin D  deficiency 03/06/2014   Hyperlipidemia associated with type 2 diabetes mellitus (HCC) 06/28/2012   ASCAD s/p PTCA (06/2012) 06/27/2012   Hypertension (1991) 06/25/2012   GERD 06/25/2012    PCP: [name], [name]  REFERRING PROVIDER:  [name], DO   REFERRING DIAG: R26.89 (ICD-10-CM) - Other abnormalities of gait and mobility   THERAPY DIAG:  Muscle weakness (generalized)  Difficulty in walking, not elsewhere classified  Rationale for Evaluation and Treatment: Rehabilitation  ONSET DATE: 03/05/2024  SUBJECTIVE:   SUBJECTIVE STATEMENT: Patient reports generally feeling tired today.  EVAL: Patient reports that he has had this weakness for 5 years. He recalls going to the hospital 3x in the past 2 months, and states that they just discovered his sodium was low during hospitalization 03/05/2024. He states that had PT following his hip replacement in 2020, but had a short POC opting to work on HEP at home. He states that he gets out of breath easily. He sometimes walks up the hill to the mailbox and gets short of breath. He also tends to the garden daily. He states that he only uses his cane for community ambulation.   Patient had recent hospitalization d/t hyponatremia, HTN; physical debilitation muscle weakness  PERTINENT HISTORY: Relevant PMHx includes arthritis, CAD, DM type II, DKA, HTN, LE neuropathy, R THA (2020), Anemia, Depression/Grief (his wife passed away 2 years ago)  PAIN:  Are you having pain? No  PRECAUTIONS: None  RED FLAGS: None   WEIGHT BEARING RESTRICTIONS: No  FALLS:  Has patient fallen in last 6 months? Yes. Number of falls 1; I  was carrying groceries upstairs and just kneeled down after my foot got caught on the last step   LIVING ENVIRONMENT: Lives with: lives alone; his children come over daily.  Lives in: House/apartment Stairs: Yes: External: 3 steps; bilateral but cannot reach both; I don't use the front stairs, because there are no railing on that one. Has following equipment at home: Quad cane small base  OCCUPATION: n/a   PLOF: Independent, Independent with household mobility without device, and Independent with community mobility without device  PATIENT GOALS: To improve strength and endurance if possible.   NEXT MD VISIT: 04/05/2024 with PCP   OBJECTIVE:  Note: Objective measures were completed at Evaluation unless otherwise noted.  DIAGNOSTIC FINDINGS:   No relevant imaging results available in epic; none included in referral    PATIENT SURVEYS:   THE PATIENT SPECIFIC FUNCTIONAL SCALE  Place score of 0-10 (0 = unable to perform activity and 10 = able to perform activity at the same level as before injury or problem)  Activity Date: 03/30/24    Walk (>20 min) 8    2.Heavy Lifting (  ie. Top soil)  5    3. Stairs 5    4.      Total Score 6      Total Score = Sum of activity scores/number of activities  Minimally Detectable Change: 3 points (for single activity); 2 points (for average score)  [facility] (nd). The Patient Specific Functional Scale . Retrieved from [email or web address]    COGNITION: Overall cognitive status: Within functional limits for tasks assessed      LOWER EXTREMITY MMT:  MMT Right eval Left eval  Hip flexion 4+ 4+  Hip extension    Hip abduction 4+ 4+  Hip adduction 4 4  Hip internal rotation    Hip external rotation    Knee flexion 5 5  Knee extension    Ankle dorsiflexion    Ankle plantarflexion    Ankle inversion    Ankle eversion     (Blank rows = not tested)   FUNCTIONAL  TESTS:  2 minute walk test: 255 ft; use of quad cane for initial 30 seconds of test.   GAIT: Distance walked: 255 ft (see above) Assistive device utilized: Quad cane small base Level of assistance: Complete Independence and Modified independence Comments: ambulates with R>L stance time, steady gait speed                                                                                                                                TREATMENT DATE:  [facility] Adult PT Treatment:                                                DATE: 04/27/24 Therapeutic Exercise: Nustep L4 LE/UE during subjective  Seated hamstring stretch 2x30  Green band hip abduction x10 BIL Green band hip extension x10 BIL cues for posture 3-3-3 tempo heel raise 2 x 10 Therapeutic Activity: STS x10 BW cues for weight shifting STS 3kg ball + fwd chest press x8 STS 3kg ball + overhead press x8  Step ups 6 steps with single UE support, x 10 each   [facility] Adult PT Treatment:                                                DATE: 04/25/2024  Therapeutic Exercise: Nu step L3 LE/UE during subjective  Green band hip abduction x12 BIL Green band hip extension x10 BIL cues for posture 3-3-3 tempo heel raise 2 x 10  Therapeutic Activity: STS x10 BW cues for weight shifting STS 3kg ball + fwd chest press x8 STS 3kg ball + overhead press x8  Step ups 6 steps with single UE support, x 10 each  [facility] Adult PT Treatment:                                                DATE: 04/20/24  Therapeutic Exercise: Nu step L3 LE/UE during subjective  Green band hip abduction x10 BIL Green band hip extension x10 BIL cues for posture 3-3-3 tempo heel raise x15 HEP update + education/handout  Therapeutic Activity: STS x10 BW cues for weight shifting STS 3kg ball + fwd chest press 2x8 cues for trunk mechanics      PATIENT EDUCATION:  Education details: rationale for interventions, HEP  Person educated:  Patient Education method: Explanation, Demonstration, Tactile cues, Verbal cues Education comprehension: verbalized understanding, returned demonstration, verbal cues required, tactile cues required, and needs further education     HOME EXERCISE PROGRAM: Access Code: [email or web address] URL: [email or web address] Date: 04/20/2024 Prepared by: Alm Jenny  Exercises - Heel Raises with Counter Support  - 1 x daily - 7 x weekly - 2 sets - 10 reps - Standing Marching  - 1 x daily - 7 x weekly - 2 sets - 10 reps - Sit to Stand  - 1 x daily - 7 x weekly - 2 sets - 10 reps - Standing Hip Abduction with Counter Support  - 1 x daily - 7 x weekly - 2 sets - 8 reps - Standing Hip Extension with Counter Support  - 1 x daily - 7 x weekly - 2 sets - 8 reps   ASSESSMENT:  CLINICAL IMPRESSION: Patient presents to PT reporting continuation of general fatigue. Session today continued to focus on LE strengthening and improving standing activity tolerance. Patient was able to tolerate all prescribed exercises with no adverse effects. Patient continues to benefit from skilled PT services and should be progressed as able to improve functional independence.   EVAL: Hilario is a 78 y.o. male who was seen today for physical therapy evaluation and treatment for BIL LE weakness following recent hospitalization. He is demonstrating decreased LE MMT BIL, decreased gait speed, and intermittent use of cane with ambulation. He has related pain and difficulty with long periods of walking, heavy lifting as related to household and yardwork responsibilities, and navigating stiars. He requires skilled PT services at this time to address relevant deficits and improve overall function.     OBJECTIVE IMPAIRMENTS: Abnormal gait, decreased endurance, and decreased strength.   ACTIVITY LIMITATIONS: carrying, lifting, standing, squatting, and stairs  PARTICIPATION LIMITATIONS: interpersonal relationship, community activity,  and yard work  PERSONAL FACTORS: Age, Past/current experiences, Time since onset of injury/illness/exacerbation, and 3+ comorbidities: Relevant PMHx includes arthritis, CAD, DM type II, DKA, HTN, LE neuropathy, R THA (2020), Anemia, Depression are also affecting patient's functional outcome.   REHAB POTENTIAL: Fair    CLINICAL DECISION MAKING: Evolving/moderate complexity  EVALUATION COMPLEXITY: Moderate   GOALS: Goals reviewed with patient? YES  SHORT TERM GOALS: Target date: 04/27/2024   Patient will be independent with initial home program at least 3 days/week.  Baseline: provided at eval Goal Status: INITIAL   2.  Patient will demonstrate at least 4+/5 MMT hip adduction strength BIL.  Baseline: see objective measures Goal Status: INITIAL     LONG TERM GOALS: Target date: 05/25/2024   Patient will report improved overall functional ability with PSFS score of 8 or greater.  Baseline: 6 Goal Status: INITIAL    2.  Patient will demonstrate ability to safely ascend/descend 8 step height at least 20 times.  Baseline: limited d/t mm fatigue  Goal status: INITIAL  3.  Patient will demonstrate ability to perform floor to waist lifting of at least 25# x 10 repetitions, using appropriate body mechanics and with no more than minimal pain in order to safely perform normal daily/occupational tasks.   Baseline:   Goal Status: INITIAL   4.  Patient will ambulate 300 ft or more during , in order to demonstrate diminished fall risk and improved community ambulation.  Baseline: 261ft (MDC: 40 ft)  Goal status: INITIAL     PLAN:  PT FREQUENCY: 1-2x/week  PT DURATION: 8 weeks  PLANNED INTERVENTIONS: 02835- PT Re-evaluation, 97750- Physical Performance Testing, 97110-Therapeutic exercises, 97530- Therapeutic activity, 97112- Neuromuscular re-education, 97535- Self Care, 02859- Manual therapy, [phone]- Aquatic Therapy, Patient/Family education, Balance training, Stair training,  Cryotherapy, and Moist heat  PLAN FOR NEXT SESSION: LE strengthening program, use of nustep to address aerobic capacity, further functional tests as indicated for static/dynamic balance and address related deficits as indicated   Date of referral: 03/10/2024 Referring provider:  [name], DO    Referring diagnosis? R26.89 (ICD-10-CM) - Other abnormalities of gait and mobility  Treatment diagnosis? (if different than referring diagnosis)  Muscle weakness (generalized) Difficulty in walking, not elsewhere classified  What was this (referring dx) caused by? Ongoing Issue  Lysle of Condition: Chronic (continuous duration > 3 months)   Laterality: Both  Current Functional Measure Score: Patient Specific Functional Scale 6/10  Objective measurements identify impairments when they are compared to normal values, the uninvolved extremity, and prior level of function.  [x]  Yes  []  No  Objective assessment of functional ability: Moderate functional limitations   Briefly describe symptoms: BIL LE weakness, decreased endurance with daily activities  How did symptoms start: gradual onset, without known MOI. Patient has hx of R THA  Average pain intensity:  Last 24 hours: 0/10  Past week: 0/10  How often does the pt experience symptoms? Frequently  How much have the symptoms interfered with usual daily activities? Moderately  How has condition changed since care began at this facility? NA - initial visit  In general, how is the patients overall health? Fair   BACK PAIN (STarT Back Screening Tool) No   Corean Pouch PTA  04/27/2024 9:55 AM

## 2024-04-27 ENCOUNTER — Telehealth: Payer: Self-pay | Admitting: Family Medicine

## 2024-04-27 ENCOUNTER — Other Ambulatory Visit: Payer: Self-pay | Admitting: Family Medicine

## 2024-04-27 ENCOUNTER — Ambulatory Visit

## 2024-04-27 DIAGNOSIS — R262 Difficulty in walking, not elsewhere classified: Secondary | ICD-10-CM | POA: Diagnosis not present

## 2024-04-27 DIAGNOSIS — M6281 Muscle weakness (generalized): Secondary | ICD-10-CM | POA: Diagnosis not present

## 2024-04-27 NOTE — Telephone Encounter (Signed)
 Copied from CRM #[id number]. Topic: Clinical - Prescription Issue >> Apr 27, 2024  2:32 PM Tonda B wrote: Reason for CRM: needs prior authorization  glycopyrrolate  (ROBINUL ) 2 MG tablet

## 2024-04-27 NOTE — Telephone Encounter (Unsigned)
 Copied from CRM #[id number]. Topic: Clinical - Medication Refill >> Apr 27, 2024  2:31 PM Geneva B wrote: Medication: glycopyrrolate  (ROBINUL ) 2 MG tablet  Has the patient contacted their pharmacy? Yes (Agent: If no, request that the patient contact the pharmacy for the refill. If patient does not wish to contact the pharmacy document the reason why and proceed with request.) (Agent: If yes, when and what did the pharmacy advise?)  This is the patient's preferred pharmacy:  [facility] - [place], [place] - [address] [place] [address] Phone: [phone] Fax: [phone]  Is this the correct pharmacy for this prescription? Yes If no, delete pharmacy and type the correct one.   Has the prescription been filled recently? Yes  Is the patient out of the medication? Yes  Has the patient been seen for an appointment in the last year OR does the patient have an upcoming appointment? Yes  Can we respond through MyChart? No  Agent: Please be advised that Rx refills may take up to 3 business days. We ask that you follow-up with your pharmacy.

## 2024-04-28 ENCOUNTER — Other Ambulatory Visit: Payer: Self-pay

## 2024-04-28 ENCOUNTER — Other Ambulatory Visit (HOSPITAL_COMMUNITY): Payer: Self-pay

## 2024-04-28 NOTE — Progress Notes (Unsigned)
 05/02/2024 Name: Michael Guzman MRN: [id number] DOB: 09-16-1945  No chief complaint on file.   Michael Guzman is a 78 y.o. year old male who presented for a telephone visit.   They were referred to the pharmacist by their PCP for assistance in managing diabetes.    Subjective:  Care Team: Primary Care Provider: [name], [name] ; Next Scheduled Visit:  Future Appointments  Date Time Provider Department Center  05/12/2024 10:00 AM [name], PTA [facility]  05/12/2024  2:30 PM [name], [facility] None  05/17/2024 10:00 AM [name], PT [facility]  05/24/2024 10:00 AM [name], PT [facility]  05/31/2024 10:15 AM [facility]  06/06/2024 10:45 AM [name], [name] [facility]  12/14/2024 10:20 AM [facility]-ANNUAL WELLNESS VISIT [facility]     Medication Access/Adherence  Current Pharmacy:  [facility] - [place], [place] - [address] [place] [address] Phone: [phone] Fax: [phone]   Patient reports affordability concerns with their medications: No  Patient reports access/transportation concerns to their pharmacy: No  Patient reports adherence concerns with their medications:  Just omission of pm 70/30 about every 5 days     Diabetes:  Current medications:  Medications tried in the past: Trulicity  stopped d/t cost.   Current glucose readings: had trouble verbalizing some of his readings. Denies readings above low 200s. Checks with finger sticks if feeling off.   Patient denies hypoglycemic s/sx including dizziness, shakiness, sweating. Patient denies hyperglycemic symptoms including polyuria, polydipsia, polyphagia, nocturia, neuropathy, blurred vision.  Current meal patterns:  - bacon eggs cheese; melons cut up in fridge.  No routine sodas, will have Gatorade mixed with water   Current medication access support: none; estimates income near  60k/yr  04/28/24 update: Spoke to patient regarding DM management, frustration with highs and lows showing up on his CGM. We discussed insulin  options as a potential step away from fixed humulin  70/30 40 units BID. Has a lot of 70/30 on hand so would rather work with this as apposed to switching to separate long acting and meal-time insulin .   Objective:  Lab Results  Component Value Date   HGBA1C 7.1 (H) 02/25/2024    Lab Results  Component Value Date   CREATININE 0.96 03/15/2024   BUN 11 03/15/2024   NA 136 03/15/2024   K 4.7 03/15/2024   CL 98 03/15/2024   CO2 28 03/15/2024    Lab Results  Component Value Date   CHOL 142 02/25/2024   HDL 60 02/25/2024   LDLCALC 63 02/25/2024   LDLDIRECT 45.7 10/30/2013   TRIG 100 02/25/2024   CHOLHDL 2.4 02/25/2024    Medications Reviewed Today   Medications were not reviewed in this encounter       Assessment/Plan:   Diabetes: - Currently controlled - Reviewed long term cardiovascular and renal outcomes of uncontrolled blood sugar - Reviewed goal A1c, goal fasting, and goal 2 hour post prandial glucose  - encouraged patient to login to portal and approved my request to remotely view BG readings especially given limited information able to be provided today  - Reviewed dietary modifications including low carb diet, swapping out berries for other fruits Recommend to check glucose continuously - Reviewed insurance information  [employer] [place] 0015: Rx deductible $255/year $47 monthly copay for Mounjaro, Trulicity , Ozempic .   [place] or Lantus  preferred long acting insulins  Humalog preferred for mealtime insulin   All  insulin  copayments would be $35/month   - Reviewed PAP options - with income of ~60k/yr, may qualify for ozempic  should that be an option moving forward. Previously tolerated Trulicity  and stopped d/t cost.   - Reviewed possibility of converting from novolin 70/30 to single daily long acting insulin  +/- mealtime insulin ,  likely could start basal at 58 units tresiba or lantus  daily; wants to think over given possibility of having 4 injections (1 long acting in the morning, 1 injection with breakfast lunch and dinner) per day versus current 2 injections per day.   Reports novolin 70/30 40 units BID (about once every 5 days will omit PM dose) - averages out to 72 units total daily insulin , 80% of this would be about 58 units daily for basal insulin  to start. Noted that PCP stated 04/05/24 in OV note that she is willing to consider change to tresiba daily.   Follow Up Plan: patient call in two weeks to discuss these options.   04/28/24 update: - patient will try to put his humulin  70/30 on hold through [facility] so they do not continue sending automatically.  - no changes at this time, hopefully can reconsider change, will try in two weeks to f/u.   Lang Sieve, PharmD, BCGP Clinical Pharmacist  [phone]

## 2024-05-02 ENCOUNTER — Telehealth: Payer: Self-pay

## 2024-05-02 ENCOUNTER — Other Ambulatory Visit (HOSPITAL_COMMUNITY): Payer: Self-pay

## 2024-05-02 NOTE — Telephone Encounter (Signed)
 Copied from CRM #[id number]. Topic: Clinical - Prescription Issue >> Apr 27, 2024  2:32 PM Geneva B wrote: Reason for CRM: needs prior authorization  glycopyrrolate  (ROBINUL ) 2 MG tablet >> May 02, 2024  9:59 AM Emylou G wrote: Patient called said he hasn't rcvd glycopyrrolate  (ROBINUL ) 2 MG tablet yet.SABRA [facility] says hasn't heard from us ?  Please adv if will be filled?  Needs 90 days >> May 02, 2024  9:44 AM Winona R wrote: Pt states [facility] is in need of more information for glycopyrrolate  (ROBINUL ) 2 MG tablet [[id number]]. He's not sure what they need but states they have faxed over a request

## 2024-05-02 NOTE — Telephone Encounter (Signed)
 PA request has been Received. New Encounter has been or will be created for follow up. For additional info see Pharmacy Prior Auth telephone encounter from 05/02/24.

## 2024-05-03 ENCOUNTER — Other Ambulatory Visit: Payer: Self-pay | Admitting: Family Medicine

## 2024-05-04 ENCOUNTER — Other Ambulatory Visit: Payer: Self-pay

## 2024-05-04 MED ORDER — LANCETS MISC. MISC: 1.0000 | Freq: Three times a day (TID) | 3 refills | Status: AC

## 2024-05-04 MED ORDER — GLYCOPYRROLATE 2 MG PO TABS: 2.0000 mg | ORAL_TABLET | Freq: Three times a day (TID) | ORAL | 11 refills | Status: AC

## 2024-05-12 ENCOUNTER — Other Ambulatory Visit: Payer: Self-pay

## 2024-05-12 ENCOUNTER — Ambulatory Visit

## 2024-05-12 ENCOUNTER — Telehealth: Payer: Self-pay

## 2024-05-12 DIAGNOSIS — M6281 Muscle weakness (generalized): Secondary | ICD-10-CM

## 2024-05-12 DIAGNOSIS — R262 Difficulty in walking, not elsewhere classified: Secondary | ICD-10-CM

## 2024-05-12 NOTE — Progress Notes (Signed)
 05/12/2024 Name: Michael Guzman MRN: [id number] DOB: Jan 02, 1946  Chief Complaint  Patient presents with   Diabetes   Medication Assistance    Michael Guzman is a 78 y.o. year old male who presented for a telephone visit.   They were referred to the pharmacist by their PCP for assistance in managing diabetes.    Subjective:  Care Team: Primary Care Provider: [name], [name] ; Next Scheduled Visit:  Future Appointments  Date Time Provider Department Center  05/17/2024 10:00 AM [name], PT [facility]  05/24/2024 10:00 AM [name], PT [facility]  05/31/2024 10:15 AM [facility]  06/06/2024 10:45 AM [name], [name] [facility]  06/21/2024 10:40 AM Beather Delon Gibson, PA [facility]  12/14/2024 10:20 AM [facility]-ANNUAL WELLNESS VISIT [facility]     Medication Access/Adherence  Current Pharmacy:  [facility] - [place], [place] - [address] [place] [address] Phone: [phone] Fax: [phone]  [facility] - [address] ([place]), [place] - [address] [place] [address] Phone: [phone] Fax: [phone]   Patient reports affordability concerns with their medications: No  Patient reports access/transportation concerns to their pharmacy: No  Patient reports adherence concerns with their medications:  Just omission of pm 70/30 about every 5 days     Diabetes:  Current medications:  Medications tried in the past: Trulicity  stopped d/t cost.   Current glucose readings: had trouble verbalizing some of his readings. Denies readings above low 200s. Checks with finger sticks if feeling off.   Patient denies hypoglycemic s/sx including dizziness, shakiness, sweating. Patient denies hyperglycemic symptoms including polyuria, polydipsia, polyphagia, nocturia, neuropathy, blurred vision.  Current meal  patterns:  - bacon eggs cheese; melons cut up in fridge.  No routine sodas, will have Gatorade mixed with water   Current medication access support: none; estimates income near 60k/yr  04/28/24 update: Spoke to patient regarding DM management, frustration with highs and lows showing up on his CGM. We discussed insulin  options as a potential step away from fixed humulin  70/30 40 units BID. Has a lot of 70/30 on hand so would rather work with this as apposed to switching to separate long acting and meal-time insulin .   05/12/2024 update: - continues to experience a lot of ups and downs with BG ranging from 70-300 throughout the day. Last night blood sugar was 255 before bed -> took his 70/30 then awoke around 3am with BG of 69. - has expressed hesitancy over changing from 70/30 to once daily insulin  and does not want to waste all of the insulin  he has in the fridge. Would be willing to provide to a charitable pharmacy and try for tresiba patient assistance  - call [facility]  Objective:  Lab Results  Component Value Date   HGBA1C 7.1 (H) 02/25/2024    Lab Results  Component Value Date   CREATININE 0.96 03/15/2024   BUN 11 03/15/2024   NA 136 03/15/2024   K 4.7 03/15/2024   CL 98 03/15/2024   CO2 28 03/15/2024    Lab Results  Component Value Date   CHOL 142 02/25/2024   HDL 60 02/25/2024   LDLCALC 63 02/25/2024   LDLDIRECT 45.7 10/30/2013   TRIG 100 02/25/2024   CHOLHDL 2.4 02/25/2024    Medications Reviewed Today   Medications were not reviewed in this encounter  Assessment/Plan:   Diabetes: - Currently controlled - Reviewed long term cardiovascular and renal outcomes of uncontrolled blood sugar - Reviewed goal A1c, goal fasting, and goal 2 hour post prandial glucose  - encouraged patient to login to portal and approved my request to remotely view BG readings especially given limited information able to be provided today  - Reviewed dietary modifications including  low carb diet, swapping out berries for other fruits Recommend to check glucose continuously - Reviewed insurance information  [employer] [place] 0015: Rx deductible $255/year $47 monthly copay for Mounjaro, Trulicity , Ozempic .   [place] or Lantus  preferred long acting insulins  Humalog preferred for mealtime insulin   All insulin  copayments would be $35/month   - Reviewed PAP options - with income of ~60k/yr, may qualify for ozempic  should that be an option moving forward. Previously tolerated Trulicity  and stopped d/t cost.   - Reviewed possibility of converting from novolin 70/30 to single daily long acting insulin  +/- mealtime insulin , likely could start basal at 58 units tresiba or lantus  daily; wants to think over given possibility of having 4 injections (1 long acting in the morning, 1 injection with breakfast lunch and dinner) per day versus current 2 injections per day.   Reports novolin 70/30 40 units BID (about once every 5 days will omit PM dose) - averages out to 72 units total daily insulin , 80% of this would be about 58 units daily for basal insulin  to start. Noted that PCP stated 04/05/24 in OV note that she is willing to consider change to tresiba daily.   Follow Up Plan: patient call in two weeks to discuss these options.   04/28/24 update: - patient will try to put his humulin  70/30 on hold through [facility] so they do not continue sending automatically.  - no changes at this time, hopefully can reconsider change, will try in two weeks to f/u.   05/12/2024 - reviewed BG goals and readings - appears uncontrolled based off of the readings provided and fluctuations described 70-300 throughout the day.  - reviewed patient assistance eligibility - should qualify, would now be ok with trying for patient assistance. Will coordinate with CPhT  - called [facility] - was able to get patient taken off of autoship for his humulin  70/30 - reviewed charitable pharmacy info - found insulin  for life program  - [email or web address]  Lang Sieve, PharmD, BCGP Clinical Pharmacist  [phone]

## 2024-05-12 NOTE — Progress Notes (Signed)
   05/12/2024 Name: Michael Guzman MRN: [id number] DOB: October 10, 1945  Chief Complaint  Patient presents with   Diabetes   Medication Management   Attempted to contact patient for scheduled appointment for medication management. Left HIPAA compliant message for patient to return my call at their convenience.   Lang Sieve, PharmD, BCGP Clinical Pharmacist  [phone]

## 2024-05-12 NOTE — Therapy (Signed)
 OUTPATIENT PHYSICAL THERAPY TREATMENT NOTE   Patient Name: Michael Guzman MRN: [id number] DOB:March 15, 1946, 78 y.o., male Today's Date: 05/12/2024  END OF SESSION:  PT End of Session - 05/12/24 0954     Visit Number 9    Number of Visits 16    Date for PT Re-Evaluation 05/25/24    Authorization Type UHC MCR    Authorization Time Period Approved 12 visits 03/30/24-05/11/24    Authorization - Visit Number 8    Authorization - Number of Visits 12    PT Start Time 1000    PT Stop Time 1038    PT Time Calculation (min) 38 min    Activity Tolerance Patient tolerated treatment well    Behavior During Therapy [facility] for tasks assessed/performed          Past Medical History:  Diagnosis Date   Arthritis    CAD (coronary artery disease)    a.s/p DES to mid LAD and OM2 06/2012.   Chronic back pain    Chronically dry eyes    Diabetes mellitus without complication (HCC)    TYPE 2    DKA (diabetic ketoacidoses) 01/30/2017   Elevated PSA    being monitored   Fatty liver    GERD (gastroesophageal reflux disease)    Hx of radiation therapy    prostate , [facility]    Hypertension    Hypertriglyceridemia    Intermediate coronary syndrome (HCC) 07/26/2012   Nausea vomiting and diarrhea 11/11/2023   Neuromuscular disorder (HCC)    neuropathy feet and legs   Prostate cancer (HCC)    RADTIATION    Past Surgical History:  Procedure Laterality Date   APPENDECTOMY     CARDIAC SURGERY  ~2017   3 stent placed.   COLONOSCOPY  02/12/2010   Buccini   COLONOSCOPY WITH PROPOFOL  N/A 09/21/2022   Procedure: COLONOSCOPY WITH PROPOFOL ;  Surgeon: Mansouraty, Aloha Raddle., MD;  Location: [facility];  Service: Gastroenterology;  Laterality: N/A;   ENDOSCOPIC MUCOSAL RESECTION N/A 09/21/2022   Procedure: ENDOSCOPIC MUCOSAL RESECTION;  Surgeon: Wilhelmenia Aloha Raddle., MD;  Location: [facility];  Service: Gastroenterology;  Laterality: N/A;   HEMOSTASIS CLIP PLACEMENT  09/21/2022    Procedure: HEMOSTASIS CLIP PLACEMENT;  Surgeon: Wilhelmenia Aloha Raddle., MD;  Location: [facility];  Service: Gastroenterology;;   LEFT HEART CATHETERIZATION WITH CORONARY ANGIOGRAM N/A 06/27/2012   Procedure: LEFT HEART CATHETERIZATION WITH CORONARY ANGIOGRAM;  Surgeon: Oneil Parchment, MD;  Location: [facility] LAB;  Service: Cardiovascular;  Laterality: N/A;   LEFT HEART CATHETERIZATION WITH CORONARY ANGIOGRAM N/A 07/25/2012   Procedure: LEFT HEART CATHETERIZATION WITH CORONARY ANGIOGRAM;  Surgeon: Wilbert JONELLE Bihari, MD;  Location: [facility] CATH LAB;  Service: Cardiovascular;  Laterality: N/A;   PROSTATE BIOPSY     RADIOLOGY WITH ANESTHESIA N/A 12/03/2015   Procedure: MRI LUMBAR SPINE;  Surgeon: Medication Radiologist, MD;  Location: [facility];  Service: Radiology;  Laterality: N/A;   SUBMUCOSAL LIFTING INJECTION  09/21/2022   Procedure: SUBMUCOSAL LIFTING INJECTION;  Surgeon: Wilhelmenia Aloha Raddle., MD;  Location: [facility];  Service: Gastroenterology;;   TOTAL HIP ARTHROPLASTY Right 04/28/2019   Procedure: TOTAL HIP ARTHROPLASTY ANTERIOR APPROACH;  Surgeon: Yvone Rush, MD;  Location: [facility];  Service: Orthopedics;  Laterality: Right;   Patient Active Problem List   Diagnosis Date Noted   Anemia 02/28/2024   Encounter for Medicare annual wellness exam 12/09/2023   Depression, recurrent (HCC) 11/22/2023   Hyponatremia 11/11/2023   Adenomatous polyp of transverse colon 06/18/2022  Hx of adenomatous colonic polyps 06/18/2022   Diaphoresis 08/04/2021   Hypersomnia with sleep apnea 08/04/2021   Excessive daytime sleepiness 08/04/2021   Excessive postexertional fatigue 08/04/2021   Diabetes mellitus with coincident hypertension (HCC) 06/17/2021   Hypoglycemia due to insulin  01/11/2021   Primary osteoarthritis of hip 04/30/2019   Former smoker 02/02/2019   Fatty infiltration of liver 11/30/2018   Hypertriglyceridemia    Microalbuminuric diabetic nephropathy (HCC) 06/01/2018   Aortic atherosclerosis  (HCC) by Abd CT scan 2015 05/30/2018   Type 2 diabetes mellitus with vascular disease (HCC) 01/31/2018   Thyroiditis 05/12/2016   Malignant neoplasm of prostate (HCC) 05/05/2016   Diabetic peripheral neuropathy (HCC) 03/06/2015   Medication management 07/24/2014   Vitamin D  deficiency 03/06/2014   Hyperlipidemia associated with type 2 diabetes mellitus (HCC) 06/28/2012   ASCAD s/p PTCA (06/2012) 06/27/2012   Hypertension (1991) 06/25/2012   GERD 06/25/2012    PCP: [name], [name]  REFERRING PROVIDER:  [name], DO   REFERRING DIAG: R26.89 (ICD-10-CM) - Other abnormalities of gait and mobility   THERAPY DIAG:  Muscle weakness (generalized)  Difficulty in walking, not elsewhere classified  Rationale for Evaluation and Treatment: Rehabilitation  ONSET DATE: 03/05/2024  SUBJECTIVE:   SUBJECTIVE STATEMENT: Patient reports that everything hurts today. He states that his hamstrings will cramp randomly throughout the day and that they feel especially bad when he firsts gets up in the morning.   EVAL: Patient reports that he has had this weakness for 5 years. He recalls going to the hospital 3x in the past 2 months, and states that they just discovered his sodium was low during hospitalization 03/05/2024. He states that had PT following his hip replacement in 2020, but had a short POC opting to work on HEP at home. He states that he gets out of breath easily. He sometimes walks up the hill to the mailbox and gets short of breath. He also tends to the garden daily. He states that he only uses his cane for community ambulation.   Patient had recent hospitalization d/t hyponatremia, HTN; physical debilitation muscle weakness  PERTINENT HISTORY: Relevant PMHx includes arthritis, CAD, DM type II, DKA, HTN, LE neuropathy, R THA (2020), Anemia, Depression/Grief (his wife passed away 2 years ago)  PAIN:  Are you having pain? No  PRECAUTIONS: None  RED  FLAGS: None   WEIGHT BEARING RESTRICTIONS: No  FALLS:  Has patient fallen in last 6 months? Yes. Number of falls 1; I was carrying groceries upstairs and just kneeled down after my foot got caught on the last step   LIVING ENVIRONMENT: Lives with: lives alone; his children come over daily.  Lives in: House/apartment Stairs: Yes: External: 3 steps; bilateral but cannot reach both; I don't use the front stairs, because there are no railing on that one. Has following equipment at home: Quad cane small base  OCCUPATION: n/a   PLOF: Independent, Independent with household mobility without device, and Independent with community mobility without device  PATIENT GOALS: To improve strength and endurance if possible.   NEXT MD VISIT: 04/05/2024 with PCP   OBJECTIVE:  Note: Objective measures were completed at Evaluation unless otherwise noted.  DIAGNOSTIC FINDINGS:   No relevant imaging results available in epic; none included in referral    PATIENT SURVEYS:   THE PATIENT SPECIFIC FUNCTIONAL SCALE  Place score of 0-10 (0 = unable to perform activity and 10 = able to perform activity at the same level as before injury  or problem)  Activity Date: 03/30/24    Walk (>20 min) 8    2.Heavy Lifting (ie. Top soil)  5    3. Stairs 5    4.      Total Score 6      Total Score = Sum of activity scores/number of activities  Minimally Detectable Change: 3 points (for single activity); 2 points (for average score)  [facility] (nd). The Patient Specific Functional Scale . Retrieved from [email or web address]    COGNITION: Overall cognitive status: Within functional limits for tasks assessed      LOWER EXTREMITY MMT:  MMT Right eval Left eval Right 05/12/24 Left 05/12/24  Hip flexion 4+ 4+    Hip extension      Hip abduction 4+ 4+    Hip adduction 4 4 4+ 4+  Hip internal rotation      Hip external rotation       Knee flexion 5 5    Knee extension      Ankle dorsiflexion      Ankle plantarflexion      Ankle inversion      Ankle eversion       (Blank rows = not tested)   FUNCTIONAL TESTS:  2 minute walk test: 255 ft; use of quad cane for initial 30 seconds of test.   GAIT: Distance walked: 255 ft (see above) Assistive device utilized: Quad cane small base Level of assistance: Complete Independence and Modified independence Comments: ambulates with R>L stance time, steady gait speed                                                                                                                                TREATMENT DATE:  [facility] Adult PT Treatment:                                                DATE: 05/12/24 Therapeutic Exercise: Nustep L4 LE/UE during subjective  Seated hamstring stretch 2x30 BIL Green band hip abduction x10 BIL Green band hip extension x10 BIL cues for posture Therapeutic Activity: STS 3kg ball + overhead press x8  Step ups 8 steps with single UE support, x 10 each  3-3-3 tempo heel raise 2 x 10 Alternating LAQ with hip adduction ball squeeze 2x10 BIL  [facility] Adult PT Treatment:                                                DATE: 04/27/24 Therapeutic Exercise: Nustep L4 LE/UE during subjective  Seated hamstring stretch 2x30  Green band hip abduction x10 BIL Green band hip extension x10 BIL cues for posture 3-3-3 tempo heel  raise 2 x 10 Therapeutic Activity: STS x10 BW cues for weight shifting STS 3kg ball + fwd chest press x8 STS 3kg ball + overhead press x8  Step ups 6 steps with single UE support, x 10 each   [facility] Adult PT Treatment:                                                DATE: 04/25/2024  Therapeutic Exercise: Nu step L3 LE/UE during subjective  Green band hip abduction x12 BIL Green band hip extension x10 BIL cues for posture 3-3-3 tempo heel raise 2 x 10  Therapeutic Activity: STS x10 BW cues for weight shifting STS  3kg ball + fwd chest press x8 STS 3kg ball + overhead press x8  Step ups 6 steps with single UE support, x 10 each   [facility] Adult PT Treatment:                                                DATE: 04/20/24  Therapeutic Exercise: Nu step L3 LE/UE during subjective  Green band hip abduction x10 BIL Green band hip extension x10 BIL cues for posture 3-3-3 tempo heel raise x15 HEP update + education/handout  Therapeutic Activity: STS x10 BW cues for weight shifting STS 3kg ball + fwd chest press 2x8 cues for trunk mechanics     PATIENT EDUCATION:  Education details: rationale for interventions, HEP  Person educated: Patient Education method: Explanation, Demonstration, Tactile cues, Verbal cues Education comprehension: verbalized understanding, returned demonstration, verbal cues required, tactile cues required, and needs further education     HOME EXERCISE PROGRAM: Access Code: [email or web address] URL: [email or web address] Date: 04/20/2024 Prepared by: Alm Jenny  Exercises - Heel Raises with Counter Support  - 1 x daily - 7 x weekly - 2 sets - 10 reps - Standing Marching  - 1 x daily - 7 x weekly - 2 sets - 10 reps - Sit to Stand  - 1 x daily - 7 x weekly - 2 sets - 10 reps - Standing Hip Abduction with Counter Support  - 1 x daily - 7 x weekly - 2 sets - 8 reps - Standing Hip Extension with Counter Support  - 1 x daily - 7 x weekly - 2 sets - 8 reps   ASSESSMENT:  CLINICAL IMPRESSION: Patient presents to PT reporting he will randomly get cramps in his hamstrings during the day and that they feel especially painful first thing in the morning. Session today continued to focus on standing activity and LE strengthening to decrease risk of falls. He did require a few seated rest breaks today for DOE, recovers quickly. He has met his STGs at this time. Patient was able to tolerate all prescribed exercises with no adverse effects. Patient continues to benefit from  skilled PT services and should be progressed as able to improve functional independence.   EVAL: Aleksandr is a 78 y.o. male who was seen today for physical therapy evaluation and treatment for BIL LE weakness following recent hospitalization. He is demonstrating decreased LE MMT BIL, decreased gait speed, and intermittent use of cane with ambulation. He has related pain and difficulty with long periods of walking, heavy lifting as  related to household and yardwork responsibilities, and navigating stiars. He requires skilled PT services at this time to address relevant deficits and improve overall function.     OBJECTIVE IMPAIRMENTS: Abnormal gait, decreased endurance, and decreased strength.   ACTIVITY LIMITATIONS: carrying, lifting, standing, squatting, and stairs  PARTICIPATION LIMITATIONS: interpersonal relationship, community activity, and yard work  PERSONAL FACTORS: Age, Past/current experiences, Time since onset of injury/illness/exacerbation, and 3+ comorbidities: Relevant PMHx includes arthritis, CAD, DM type II, DKA, HTN, LE neuropathy, R THA (2020), Anemia, Depression are also affecting patient's functional outcome.   REHAB POTENTIAL: Fair    CLINICAL DECISION MAKING: Evolving/moderate complexity  EVALUATION COMPLEXITY: Moderate   GOALS: Goals reviewed with patient? YES  SHORT TERM GOALS: Target date: 04/27/2024   Patient will be independent with initial home program at least 3 days/week.  Baseline: provided at eval Goal Status: MET Pt reports adherence 05/12/24   2.  Patient will demonstrate at least 4+/5 MMT hip adduction strength BIL.  Baseline: see objective measures Goal Status: MET See above chart     LONG TERM GOALS: Target date: 05/25/2024   Patient will report improved overall functional ability with PSFS score of 8 or greater.  Baseline: 6 Goal Status: INITIAL    2.  Patient will demonstrate ability to safely ascend/descend 8 step height at least 20  times.  Baseline: limited d/t mm fatigue  Goal status: INITIAL  3.  Patient will demonstrate ability to perform floor to waist lifting of at least 25# x 10 repetitions, using appropriate body mechanics and with no more than minimal pain in order to safely perform normal daily/occupational tasks.   Baseline:   Goal Status: INITIAL   4.  Patient will ambulate 300 ft or more during , in order to demonstrate diminished fall risk and improved community ambulation.  Baseline: 252ft (MDC: 40 ft)  Goal status: INITIAL     PLAN:  PT FREQUENCY: 1-2x/week  PT DURATION: 8 weeks  PLANNED INTERVENTIONS: 02835- PT Re-evaluation, 97750- Physical Performance Testing, 97110-Therapeutic exercises, 97530- Therapeutic activity, 97112- Neuromuscular re-education, 97535- Self Care, 02859- Manual therapy, [phone]- Aquatic Therapy, Patient/Family education, Balance training, Stair training, Cryotherapy, and Moist heat  PLAN FOR NEXT SESSION: LE strengthening program, use of nustep to address aerobic capacity, further functional tests as indicated for static/dynamic balance and address related deficits as indicated   Date of referral: 03/10/2024 Referring provider:  [name], DO    Referring diagnosis? R26.89 (ICD-10-CM) - Other abnormalities of gait and mobility  Treatment diagnosis? (if different than referring diagnosis)  Muscle weakness (generalized) Difficulty in walking, not elsewhere classified  What was this (referring dx) caused by? Ongoing Issue  Lysle of Condition: Chronic (continuous duration > 3 months)   Laterality: Both  Current Functional Measure Score: Patient Specific Functional Scale 6/10  Objective measurements identify impairments when they are compared to normal values, the uninvolved extremity, and prior level of function.  [x]  Yes  []  No  Objective assessment of functional ability: Moderate functional limitations   Briefly describe symptoms: BIL LE weakness,  decreased endurance with daily activities  How did symptoms start: gradual onset, without known MOI. Patient has hx of R THA  Average pain intensity:  Last 24 hours: 0/10  Past week: 0/10  How often does the pt experience symptoms? Frequently  How much have the symptoms interfered with usual daily activities? Moderately  How has condition changed since care began at this facility? NA - initial visit  In general, how is  the patients overall health? Fair   BACK PAIN (STarT Back Screening Tool) No   Corean Pouch PTA  05/12/2024 10:37 AM

## 2024-05-16 ENCOUNTER — Encounter: Payer: Medicare Other | Admitting: Internal Medicine

## 2024-05-17 ENCOUNTER — Ambulatory Visit: Attending: Internal Medicine

## 2024-05-17 DIAGNOSIS — R262 Difficulty in walking, not elsewhere classified: Secondary | ICD-10-CM | POA: Insufficient documentation

## 2024-05-17 DIAGNOSIS — M6281 Muscle weakness (generalized): Secondary | ICD-10-CM | POA: Insufficient documentation

## 2024-05-17 NOTE — Therapy (Incomplete)
 OUTPATIENT PHYSICAL THERAPY TREATMENT NOTE DISCHARGE SUMMARY    Patient Name: Michael Guzman MRN: [id number] DOB:Dec 04, 1945, 78 y.o., male Today's Date: 05/17/2024   PHYSICAL THERAPY DISCHARGE SUMMARY  Visits from Start of Care: 10   Current functional level related to goals / functional outcomes: See objective findings/assessment    Remaining deficits: See objective findings/assessment    Education / Equipment: See today's treatment/assessment      Patient agrees to discharge. Patient goals were met. Patient is being discharged due to being pleased with the current functional level.     END OF SESSION:  PT End of Session - 05/17/24 1008     Visit Number 10    Number of Visits 16    Date for PT Re-Evaluation 05/25/24    Authorization Type [employer] MCR    Authorization Time Period Approved 12 visits 03/30/24-05/11/24    Authorization - Visit Number 9    Authorization - Number of Visits 12    PT Start Time 1001    PT Stop Time 1039    PT Time Calculation (min) 38 min    Activity Tolerance Patient tolerated treatment well           Past Medical History:  Diagnosis Date   Arthritis    CAD (coronary artery disease)    a.s/p DES to mid LAD and OM2 06/2012.   Chronic back pain    Chronically dry eyes    Diabetes mellitus without complication (HCC)    TYPE 2    DKA (diabetic ketoacidoses) 01/30/2017   Elevated PSA    being monitored   Fatty liver    GERD (gastroesophageal reflux disease)    Hx of radiation therapy    prostate , [facility]    Hypertension    Hypertriglyceridemia    Intermediate coronary syndrome (HCC) 07/26/2012   Nausea vomiting and diarrhea 11/11/2023   Neuromuscular disorder (HCC)    neuropathy feet and legs   Prostate cancer (HCC)    RADTIATION    Past Surgical History:  Procedure Laterality Date   APPENDECTOMY     CARDIAC SURGERY  ~2017   3 stent placed.   COLONOSCOPY  02/12/2010   Buccini   COLONOSCOPY WITH  PROPOFOL  N/A 09/21/2022   Procedure: COLONOSCOPY WITH PROPOFOL ;  Surgeon: Wilhelmenia Aloha Raddle., MD;  Location: [facility];  Service: Gastroenterology;  Laterality: N/A;   ENDOSCOPIC MUCOSAL RESECTION N/A 09/21/2022   Procedure: ENDOSCOPIC MUCOSAL RESECTION;  Surgeon: Wilhelmenia Aloha Raddle., MD;  Location: [facility];  Service: Gastroenterology;  Laterality: N/A;   HEMOSTASIS CLIP PLACEMENT  09/21/2022   Procedure: HEMOSTASIS CLIP PLACEMENT;  Surgeon: Wilhelmenia Aloha Raddle., MD;  Location: [facility];  Service: Gastroenterology;;   LEFT HEART CATHETERIZATION WITH CORONARY ANGIOGRAM N/A 06/27/2012   Procedure: LEFT HEART CATHETERIZATION WITH CORONARY ANGIOGRAM;  Surgeon: Oneil Parchment, MD;  Location: [facility];  Service: Cardiovascular;  Laterality: N/A;   LEFT HEART CATHETERIZATION WITH CORONARY ANGIOGRAM N/A 07/25/2012   Procedure: LEFT HEART CATHETERIZATION WITH CORONARY ANGIOGRAM;  Surgeon: Wilbert JONELLE Bihari, MD;  Location: [facility]ATH [facility];  Service: Cardiovascular;  Laterality: N/A;   PROSTATE BIOPSY     RADIOLOGY WITH ANESTHESIA N/A 12/03/2015   Procedure: MRI LUMBAR SPINE;  Surgeon: Medication Radiologist, MD;  Location: [facility];  Service: Radiology;  Laterality: N/A;   SUBMUCOSAL LIFTING INJECTION  09/21/2022   Procedure: SUBMUCOSAL LIFTING INJECTION;  Surgeon: Wilhelmenia Aloha Raddle., MD;  Location: [facility];  Service: Gastroenterology;;   TOTAL HIP ARTHROPLASTY Right  04/28/2019   Procedure: TOTAL HIP ARTHROPLASTY ANTERIOR APPROACH;  Surgeon: Yvone Rush, MD;  Location: [facility];  Service: Orthopedics;  Laterality: Right;   Patient Active Problem List   Diagnosis Date Noted   Anemia 02/28/2024   Encounter for Medicare annual wellness exam 12/09/2023   Depression, recurrent (HCC) 11/22/2023   Hyponatremia 11/11/2023   Adenomatous polyp of transverse colon 06/18/2022   Hx of adenomatous colonic polyps 06/18/2022   Diaphoresis 08/04/2021   Hypersomnia with sleep apnea 08/04/2021    Excessive daytime sleepiness 08/04/2021   Excessive postexertional fatigue 08/04/2021   Diabetes mellitus with coincident hypertension (HCC) 06/17/2021   Hypoglycemia due to insulin  01/11/2021   Primary osteoarthritis of hip 04/30/2019   Former smoker 02/02/2019   Fatty infiltration of liver 11/30/2018   Hypertriglyceridemia    Microalbuminuric diabetic nephropathy (HCC) 06/01/2018   Aortic atherosclerosis (HCC) by Abd CT scan 2015 05/30/2018   Type 2 diabetes mellitus with vascular disease (HCC) 01/31/2018   Thyroiditis 05/12/2016   Malignant neoplasm of prostate (HCC) 05/05/2016   Diabetic peripheral neuropathy (HCC) 03/06/2015   Medication management 07/24/2014   Vitamin D  deficiency 03/06/2014   Hyperlipidemia associated with type 2 diabetes mellitus (HCC) 06/28/2012   ASCAD s/p PTCA (06/2012) 06/27/2012   Hypertension (1991) 06/25/2012   GERD 06/25/2012    PCP: [name], [name]  REFERRING PROVIDER:  [name], DO   REFERRING DIAG: R26.89 (ICD-10-CM) - Other abnormalities of gait and mobility   THERAPY DIAG:  Muscle weakness (generalized)  Difficulty in walking, not elsewhere classified  Rationale for Evaluation and Treatment: Rehabilitation  ONSET DATE: 03/05/2024  SUBJECTIVE:   SUBJECTIVE STATEMENT: Patient reporting that he feels good about discharging from PT today; he is able to perform all his daily activities with minimal difficulty. He still notes feeling winded with activity, but this is no worse than at his baseline.   EVAL: Patient reports that he has had this weakness for 5 years. He recalls going to the hospital 3x in the past 2 months, and states that they just discovered his sodium was low during hospitalization 03/05/2024. He states that had PT following his hip replacement in 2020, but had a short POC opting to work on HEP at home. He states that he gets out of breath easily. He sometimes walks up the hill to the mailbox and gets short of  breath. He also tends to the garden daily. He states that he only uses his cane for community ambulation.   Patient had recent hospitalization d/t hyponatremia, HTN; physical debilitation muscle weakness  PERTINENT HISTORY: Relevant PMHx includes arthritis, CAD, DM type II, DKA, HTN, LE neuropathy, R THA (2020), Anemia, Depression/Grief (his wife passed away 2 years ago)  PAIN:  Are you having pain? No  PRECAUTIONS: None  RED FLAGS: None   WEIGHT BEARING RESTRICTIONS: No  FALLS:  Has patient fallen in last 6 months? Yes. Number of falls 1; I was carrying groceries upstairs and just kneeled down after my foot got caught on the last step   LIVING ENVIRONMENT: Lives with: lives alone; his children come over daily.  Lives in: House/apartment Stairs: Yes: External: 3 steps; bilateral but cannot reach both; I don't use the front stairs, because there are no railing on that one. Has following equipment at home: Quad cane small base  OCCUPATION: n/a   PLOF: Independent, Independent with household mobility without device, and Independent with community mobility without device  PATIENT GOALS: To improve strength and endurance if  possible.   NEXT MD VISIT: 04/05/2024 with PCP   OBJECTIVE:  Note: Objective measures were completed at Evaluation unless otherwise noted.  DIAGNOSTIC FINDINGS:   No relevant imaging results available in epic; none included in referral    PATIENT SURVEYS:   THE PATIENT SPECIFIC FUNCTIONAL SCALE  Place score of 0-10 (0 = unable to perform activity and 10 = able to perform activity at the same level as before injury or problem)  Activity Date: 03/30/24 Date:  05/17/24   Walk (>20 min) 8      7   2.Heavy Lifting (ie. Top soil)  5      9   3. Stairs 5     10   4.      Total Score 6 8.6     Total Score = Sum of activity scores/number of activities  Minimally Detectable Change: 3 points (for single activity); 2 points (for average  score)  [facility] (nd). The Patient Specific Functional Scale . Retrieved from [email or web address]    COGNITION: Overall cognitive status: Within functional limits for tasks assessed      LOWER EXTREMITY MMT:  MMT Right eval Left eval Right 05/12/24 Left 05/12/24 Right  05/17/24 Left 05/17/24  Hip flexion 4+ 4+   5 5  Hip extension        Hip abduction 4+ 4+   5 5  Hip adduction 4 4 4+ 4+ 4+ 4+  Hip internal rotation        Hip external rotation        Knee flexion 5 5   5 5   Knee extension        Ankle dorsiflexion        Ankle plantarflexion        Ankle inversion        Ankle eversion         (Blank rows = not tested)   FUNCTIONAL TESTS:  2 minute walk test: 255 ft; use of quad cane for initial 30 seconds of test.   GAIT: Distance walked: 255 ft (see above) Assistive device utilized: Quad cane small base Level of assistance: Complete Independence and Modified independence Comments: ambulates with R>L stance time, steady gait speed                                                                                                                                TREATMENT DATE:    [facility] Adult PT Treatment:                                                DATE: 05/17/2024  Therapeutic Activity:  Reassessment of objective measures and subjective assessment regarding progress towards established goals and plan for independence with prescribed home program following discharged from PT  Nustep  L4 LE/UE during subjective     PATIENT EDUCATION:  Education details: rationale for interventions, HEP  Person educated: Patient Education method: Explanation, Demonstration, Tactile cues, Verbal cues Education comprehension: verbalized understanding, returned demonstration, verbal cues required, tactile cues required, and needs further education     HOME EXERCISE PROGRAM: Access Code: [email or web address] URL:  [email or web address] Date: 04/20/2024 Prepared by: Alm Jenny  Exercises - Heel Raises with Counter Support  - 1 x daily - 7 x weekly - 2 sets - 10 reps - Standing Marching  - 1 x daily - 7 x weekly - 2 sets - 10 reps - Sit to Stand  - 1 x daily - 7 x weekly - 2 sets - 10 reps - Standing Hip Abduction with Counter Support  - 1 x daily - 7 x weekly - 2 sets - 8 reps - Standing Hip Extension with Counter Support  - 1 x daily - 7 x weekly - 2 sets - 8 reps   ASSESSMENT:  CLINICAL IMPRESSION: Courtenay has attended 9 visits since initial evaluation. Patient has made good progress with LQ strength, stair tolerance, heavy lifting, and overall activity tolerance, and have met all goals.He should continue with prescribed home program. Patient will be discharged from skilled PT at this time and should follow up with referring provider as needed.    EVAL: Waverly is a 78 y.o. male who was seen today for physical therapy evaluation and treatment for BIL LE weakness following recent hospitalization. He is demonstrating decreased LE MMT BIL, decreased gait speed, and intermittent use of cane with ambulation. He has related pain and difficulty with long periods of walking, heavy lifting as related to household and yardwork responsibilities, and navigating stiars. He requires skilled PT services at this time to address relevant deficits and improve overall function.     OBJECTIVE IMPAIRMENTS: Abnormal gait, decreased endurance, and decreased strength.   ACTIVITY LIMITATIONS: carrying, lifting, standing, squatting, and stairs  PARTICIPATION LIMITATIONS: interpersonal relationship, community activity, and yard work  PERSONAL FACTORS: Age, Past/current experiences, Time since onset of injury/illness/exacerbation, and 3+ comorbidities: Relevant PMHx includes arthritis, CAD, DM type II, DKA, HTN, LE neuropathy, R THA (2020), Anemia, Depression are also affecting patient's functional outcome.    REHAB POTENTIAL: Fair    CLINICAL DECISION MAKING: Evolving/moderate complexity  EVALUATION COMPLEXITY: Moderate   GOALS: Goals reviewed with patient? YES  SHORT TERM GOALS: Target date: 04/27/2024   Patient will be independent with initial home program at least 3 days/week.  Baseline: provided at eval Goal Status: MET Pt reports adherence 05/12/24   2.  Patient will demonstrate at least 4+/5 MMT hip adduction strength BIL.  Baseline: see objective measures Goal Status: MET See above chart     LONG TERM GOALS: Target date: 05/25/2024   Patient will report improved overall functional ability with PSFS score of 8 or greater.  Baseline: 6 Goal Status: MET  2.  Patient will demonstrate ability to safely ascend/descend 8 step height at least 20 times.  Baseline: limited d/t mm fatigue  Goal status: MET; reports no more fatigue that at baseline   3.  Patient will demonstrate ability to perform floor to waist lifting of at least 25# x 10 repetitions, using appropriate body mechanics and with no more than minimal pain in order to safely perform normal daily/occupational tasks.   Goal Status:  MET  4.  Patient will ambulate 300 ft or more during , in order to demonstrate diminished fall risk and improved  community ambulation.  Baseline: 284ft (MDC: 40 ft)  05/17/24: 310 ft  Goal status: MET     PLAN:  PT FREQUENCY: 1-2x/week  PT DURATION: 8 weeks  PLANNED INTERVENTIONS: 97164- PT Re-evaluation, 97750- Physical Performance Testing, 97110-Therapeutic exercises, 97530- Therapeutic activity, 97112- Neuromuscular re-education, 97535- Self Care, 02859- Manual therapy, [phone]- Aquatic Therapy, Patient/Family education, Balance training, Stair training, Cryotherapy, and Moist heat   Marko Molt, PT, DPT  05/18/2024 10:17 AM

## 2024-05-23 ENCOUNTER — Telehealth: Payer: Self-pay

## 2024-05-23 ENCOUNTER — Other Ambulatory Visit: Payer: Self-pay | Admitting: Nurse Practitioner

## 2024-05-23 NOTE — Telephone Encounter (Signed)
 PAP: Patient assistance application for Ozempic and Tresiba  through [employer] has been mailed to pt's home address on file. Provider portion of application will be faxed to provider's office. Patient portion e-filed.

## 2024-05-23 NOTE — Telephone Encounter (Signed)
-----   Message from Jeoffrey GORMAN Barrio sent at 05/22/2024  4:18 PM EDT ----- Regarding: RE: Med Changes through Patient Assistance Yes that sounds great, thank you! ----- Message ----- From: [name], [facility] Sent: 05/22/2024   9:10 AM EDT To: Jeoffrey GORMAN Barrio, [name] Subject: Med Changes through Patient Assistance         Hi Amber! Just wanted to follow-up on a message I had sent through a cc'd chart. Would you be okay with me sending the below to patient? Thank you, Cadet.    Here is the previous message:   Me to Barrio Jeoffrey GORMAN, [name]   05/12/24  4:25 PM Hi Amber, I talked with patient again today and he is now okay with making the change from his humulin  70/30 (~40 units BID current, has been having a ton of fluctuations in BGs) to once daily tresiba flex touch pen. He is also okay with trying ozempic . I can send him patient assistance applications for tresiba 60 units daily and ozempic  starting dose if this sounds good. You see him next 06/06/2024. Please let me know what you think! Thanks, Sherisa Gilvin

## 2024-05-24 ENCOUNTER — Encounter

## 2024-05-26 ENCOUNTER — Telehealth: Payer: Self-pay

## 2024-05-26 ENCOUNTER — Other Ambulatory Visit: Payer: Self-pay

## 2024-05-26 NOTE — Telephone Encounter (Signed)
 Forms have been faxed in for clarification.

## 2024-05-26 NOTE — Progress Notes (Signed)
   05/26/2024  Patient ID: Michael Guzman, male   DOB: 07-Jan-1946, 78 y.o.   MRN: [id number]  Spoke with patient regarding updates to patient assistance programs - all information for tresiba and ozempic  has been submitted for patient and provider as of today 05/26/2024. Will await determination on that.   Patient also made aware of insulin  donation program, will work closely with patient on this if approved for PAP. Program name is insulin  for life.   Patient also appreciative of his 70/30 being put on hold through [employer] given his surplus.   Will check back in with patient in two weeks. Provided direct line and encouraged any questions.   Future Appointments  Date Time Provider Department Center  05/31/2024 10:15 AM [facility]  06/06/2024 10:45 AM [name], [name] [facility]  06/21/2024 10:40 AM Beather Delon Gibson, PA [facility]  12/14/2024 10:20 AM [facility]ANNUAL WELLNESS VISIT [facility]   Lang Sieve, PharmD, BCGP Clinical Pharmacist  [phone]

## 2024-05-26 NOTE — Telephone Encounter (Signed)
 PAP: Patient assistance application for Ozempic  and Tresiba through [employer] has been mailed to [employer] home address on file. Provider portion of application will be faxed to provider's office. Patient portion e-filed.     Provider portion received, signed and completed. Forms for Patient assistance application faxed back to [employer] Patient Assistance Program at [phone] attn . [place]. 05/26/2024 08:20 am SRP, CMA

## 2024-05-26 NOTE — Telephone Encounter (Signed)
 PAP: Application for Ozempic and Evaristo Bury has been submitted to [employer], via fax

## 2024-05-26 NOTE — Telephone Encounter (Signed)
 Copied from CRM #[id number]. Topic: Clinical - Medication Question >> May 26, 2024  8:33 AM Pinkey ORN wrote: Reason for CRM: atorvastatin  (LIPITOR ) 10 MG tablet & Insulin  Pen Needle (NOVOFINE) 30G X 8 MM MISC >> May 26, 2024  8:35 AM Pinkey ORN wrote: Patient is requesting a call back, wanting to know rather or not he's suppose to be taking this medication. Patient states the pharmacy has the medication on hold until provider approves it. Please follow up with patient at [phone]

## 2024-05-30 NOTE — Telephone Encounter (Signed)
 PAP: Patient assistance application for Ozempic  and [place] has been approved by PAP Companies: [employer] from 05/30/2024 to 09/13/2024. Medication should be delivered to PAP Delivery: Provider's office. For further shipping updates, please contact [employer] at [phone]. Patient ID is: [id number]

## 2024-05-31 ENCOUNTER — Other Ambulatory Visit

## 2024-05-31 ENCOUNTER — Other Ambulatory Visit: Payer: Self-pay | Admitting: Nurse Practitioner

## 2024-05-31 DIAGNOSIS — E1159 Type 2 diabetes mellitus with other circulatory complications: Secondary | ICD-10-CM

## 2024-05-31 DIAGNOSIS — E119 Type 2 diabetes mellitus without complications: Secondary | ICD-10-CM

## 2024-06-01 ENCOUNTER — Telehealth: Payer: Self-pay

## 2024-06-01 LAB — COMPREHENSIVE METABOLIC PANEL WITH GFR
AG Ratio: 1.8 (calc) (ref 1.0–2.5)
ALT: 17 U/L (ref 9–46)
AST: 17 U/L (ref 10–35)
Albumin: 4.5 g/dL (ref 3.6–5.1)
Alkaline phosphatase (APISO): 66 U/L (ref 35–144)
BUN: 12 mg/dL (ref 7–25)
CO2: 30 mmol/L (ref 20–32)
Calcium: 9.7 mg/dL (ref 8.6–10.3)
Chloride: 101 mmol/L (ref 98–110)
Creat: 0.99 mg/dL (ref 0.70–1.28)
Globulin: 2.5 g/dL (ref 1.9–3.7)
Glucose, Bld: 196 mg/dL — ABNORMAL HIGH (ref 65–99)
Potassium: 4.4 mmol/L (ref 3.5–5.3)
Sodium: 137 mmol/L (ref 135–146)
Total Bilirubin: 0.3 mg/dL (ref 0.2–1.2)
Total Protein: 7 g/dL (ref 6.1–8.1)
eGFR: 79 mL/min/1.73m2 (ref >=60)

## 2024-06-01 LAB — HEMOGLOBIN A1C
Hgb A1c MFr Bld: 8.3 % — ABNORMAL HIGH (ref <5.7)
Mean Plasma Glucose: 192 mg/dL
eAG (mmol/L): 10.6 mmol/L

## 2024-06-01 NOTE — Telephone Encounter (Signed)
 PAP: Patient assistance application for Ozempic  and [place] has been approved by PAP Companies: [employer] from 05/31/2024 to 09/13/2024. Medication should be delivered to PAP Delivery: Provider's office. For further shipping updates, please contact [employer] at [phone]. Patient ID is: [id number]   Approval letter uploaded to media tab

## 2024-06-02 ENCOUNTER — Telehealth: Payer: Self-pay

## 2024-06-02 ENCOUNTER — Other Ambulatory Visit: Payer: Self-pay

## 2024-06-02 ENCOUNTER — Other Ambulatory Visit: Payer: Self-pay | Admitting: Nurse Practitioner

## 2024-06-02 DIAGNOSIS — E119 Type 2 diabetes mellitus without complications: Secondary | ICD-10-CM

## 2024-06-02 DIAGNOSIS — E785 Hyperlipidemia, unspecified: Secondary | ICD-10-CM

## 2024-06-02 MED ORDER — METFORMIN HCL ER 500 MG PO TB24: ORAL_TABLET | ORAL | 1 refills | Status: DC

## 2024-06-02 MED ORDER — ATORVASTATIN CALCIUM 10 MG PO TABS: 10.0000 mg | ORAL_TABLET | Freq: Every day | ORAL | 1 refills | Status: AC

## 2024-06-02 NOTE — Telephone Encounter (Signed)
 Copied from CRM [id number]. Topic: Clinical - Prescription Issue >> Jun 02, 2024  9:23 AM Avram MATSU wrote: Reason for CRM: John calling from [facility] and stated a request was sent several times for a refill and was denied. The pt would like to know why the refill is being denied, please advise [phone] (M)   metFORMIN  (GLUCOPHAGE -XR) 500 MG 24 hr tablet [Pharmacy Med Name: metFORMIN  HCl ER 500 MG   atorvastatin  (LIPITOR ) 10 MG tablet [Pharmacy Med Name: Atorvastatin  Calcium  10 MG Oral Tablet]  [facility]- [phone] direct line

## 2024-06-06 ENCOUNTER — Encounter: Payer: Self-pay | Admitting: Family Medicine

## 2024-06-06 ENCOUNTER — Ambulatory Visit (INDEPENDENT_AMBULATORY_CARE_PROVIDER_SITE_OTHER): Admitting: Family Medicine

## 2024-06-06 VITALS — BP 124/82 | HR 58 | Temp 98.0°F | Ht 70.0 in | Wt 209.1 lb

## 2024-06-06 DIAGNOSIS — Z794 Long term (current) use of insulin: Secondary | ICD-10-CM | POA: Diagnosis not present

## 2024-06-06 DIAGNOSIS — E1159 Type 2 diabetes mellitus with other circulatory complications: Secondary | ICD-10-CM

## 2024-06-06 NOTE — Assessment & Plan Note (Addendum)
 A1c 8.3%. working with myself and Pharm D to get [place] and Ozempic . Will start tresiba (100U/mL) 60 units SQ daily (**take day after last dose of Humulin  70/30, do not take Humulin  70/30 and Tresiba the same day**) and ozempic  0.25 mg SQ once weekly for four weeks then 0.5mg  SQ weekly.  Will need to decrease basal 10% with increases in Ozempic .  Reiterated the importance of medication complaince, monitoring BG, highs and lows, and how to treat highs and lows.  Michael Guzman remains very reluctant to take medications as prescribed due to fears of hypoglycemia and additionally is eating very sweet items. Provided counseling on these things. Will refer to Endo for further assistance.  Follow up in 1 month after starting ozempic 

## 2024-06-06 NOTE — Progress Notes (Signed)
 Subjective:  HPI: Michael Guzman is a 78 y.o. male presenting on 06/06/2024 for Medical Management of Chronic Issues (  2  Month f/u )   HPI Patient is in today for DM follow up. Since our last OV has established with PharmD. A1c is 8.3%  Michael Guzman remains inconsistent with his medication and diet. Currently taking Humulin  70/30 whatever he feels like up to 35-50 units BID and Metformin  XR 1000mg  BID. CGM GMI is 8.0%, TIR 25%, TAR 74% and TBR 1%. He reports he is skipping doses because he is afraid of lows. This is understandable however he has had no lows in 90 days, his 1 reporded was when his CGM fell off.   Is fed up with the [employer] all night. Has been eating birthday cake and drinking regular Gatorade.   Has been working with PharmD on getting [place] and Ozempic , with financial assistance. This should be delivered to my office. Plan is for tresiba (100U/mL) 60 units SQ daily (**take day after last dose of Humulin  70/30, do not take Humulin  70/30 and Tresiba the same day**) and ozempic  0.25 mg SQ once weekly for four weeks then 0.5mg  SQ weekly.   Denies polyuria, polydypsia, chest pain, paresthesias, or chest pain.   PharmD 05/12/2024 update: - continues to experience a lot of ups and downs with BG ranging from 70-300 throughout the day. Last night blood sugar was 255 before bed -> took his 70/30 then awoke around 3am with BG of 69. - has expressed hesitancy over changing from 70/30 to once daily insulin  and does not want to waste all of the insulin  he has in the fridge. Would be willing to provide to a charitable pharmacy and try for tresiba patient assistance  - call [facility]    A&P 04/05/2024: Assessment & Plan: Last A1c 7.1%. Insulin  administration remains inconsistent and he is understandably fearful of hypoglycemia. He believes he is experiencing lows however his CGM reports no lows, he questions accuracy however is finger sticking to verify. Will increase insulin  to  40 units BID. Discussed verifying abnormal readings with glucose monitor prior to treating. Will start with 35 units BID insulin  today and increase by 1 units daily until FBG readings are <130. Referral to pharmacy for assistance managing and will consider switching to Tresiba or alternative with less hypoglycemia potential. Follow up in 2 months, keep appt with pharmD  Review of Systems  All other systems reviewed and are negative.   Relevant past medical history reviewed and updated as indicated.   Past Medical History:  Diagnosis Date   Arthritis    CAD (coronary artery disease)    a.s/p DES to mid LAD and OM2 06/2012.   Chronic back pain    Chronically dry eyes    Diabetes mellitus without complication (HCC)    TYPE 2    DKA (diabetic ketoacidoses) 01/30/2017   Elevated PSA    being monitored   Fatty liver    GERD (gastroesophageal reflux disease)    Hx of radiation therapy    prostate , [facility]    Hypertension    Hypertriglyceridemia    Intermediate coronary syndrome (HCC) 07/26/2012   Nausea vomiting and diarrhea 11/11/2023   Neuromuscular disorder (HCC)    neuropathy feet and legs   Prostate cancer (HCC)    RADTIATION      Past Surgical History:  Procedure Laterality Date   APPENDECTOMY     CARDIAC SURGERY  ~2017  3 stent placed.   COLONOSCOPY  02/12/2010   Buccini   COLONOSCOPY WITH PROPOFOL  N/A 09/21/2022   Procedure: COLONOSCOPY WITH PROPOFOL ;  Surgeon: Wilhelmenia Aloha Raddle., MD;  Location: [facility];  Service: Gastroenterology;  Laterality: N/A;   ENDOSCOPIC MUCOSAL RESECTION N/A 09/21/2022   Procedure: ENDOSCOPIC MUCOSAL RESECTION;  Surgeon: Wilhelmenia Aloha Raddle., MD;  Location: [facility];  Service: Gastroenterology;  Laterality: N/A;   HEMOSTASIS CLIP PLACEMENT  09/21/2022   Procedure: HEMOSTASIS CLIP PLACEMENT;  Surgeon: Wilhelmenia Aloha Raddle., MD;  Location: [facility];  Service: Gastroenterology;;   LEFT HEART CATHETERIZATION  WITH CORONARY ANGIOGRAM N/A 06/27/2012   Procedure: LEFT HEART CATHETERIZATION WITH CORONARY ANGIOGRAM;  Surgeon: Oneil Parchment, MD;  Location: [facility] CATH LAB;  Service: Cardiovascular;  Laterality: N/A;   LEFT HEART CATHETERIZATION WITH CORONARY ANGIOGRAM N/A 07/25/2012   Procedure: LEFT HEART CATHETERIZATION WITH CORONARY ANGIOGRAM;  Surgeon: Wilbert JONELLE Bihari, MD;  Location: [facility] CATH [facility];  Service: Cardiovascular;  Laterality: N/A;   PROSTATE BIOPSY     RADIOLOGY WITH ANESTHESIA N/A 12/03/2015   Procedure: MRI LUMBAR SPINE;  Surgeon: Medication Radiologist, MD;  Location: [facility];  Service: Radiology;  Laterality: N/A;   SUBMUCOSAL LIFTING INJECTION  09/21/2022   Procedure: SUBMUCOSAL LIFTING INJECTION;  Surgeon: Wilhelmenia Aloha Raddle., MD;  Location: [facility];  Service: Gastroenterology;;   TOTAL HIP ARTHROPLASTY Right 04/28/2019   Procedure: TOTAL HIP ARTHROPLASTY ANTERIOR APPROACH;  Surgeon: Yvone Rush, MD;  Location: [facility];  Service: Orthopedics;  Laterality: Right;    Allergies and medications reviewed and updated.   Current Outpatient Medications:    Ascorbic Acid (VITAMIN C) 1000 MG tablet, Take 1,000 mg by mouth in the morning., Disp: , Rfl:    aspirin  EC 81 MG tablet, Take 1 tablet (81 mg total) by mouth daily., Disp: 90 tablet, Rfl: 3   atorvastatin  (LIPITOR ) 10 MG tablet, Take 1 tablet (10 mg total) by mouth daily., Disp: 100 tablet, Rfl: 1   B Complex-C (SUPER B COMPLEX PO), Take 1 tablet by mouth in the morning., Disp: , Rfl:    Blood Glucose Monitoring Suppl DEVI, 1 each by Does not apply route in the morning, at noon, and at bedtime. May substitute to any manufacturer covered by patient's insurance., Disp: 1 each, Rfl: 0   Cholecalciferol  (VITAMIN D3) 250 MCG (10000 UT) capsule, Take 10,000 Units by mouth in the morning., Disp: , Rfl:    Continuous Glucose Sensor (FREESTYLE LIBRE 3 PLUS SENSOR) MISC, Use to monitor blood sugar continuously. Change sensor every 15 days., Disp: 2  each, Rfl: 6   gabapentin  (NEURONTIN ) 800 MG tablet, TAKE 1 TABLET BY MOUTH 3 TO 4 TIMES DAILY FOR PAIN, HOT FLASHES AND SWEATS, Disp: 400 tablet, Rfl: 2   glycopyrrolate  (ROBINUL ) 2 MG tablet, Take  1 tablet  3 x / day for Excessive Sweating  TAKE ONE TABLET BY                                                 MOUTH, Disp: 270 tablet, Rfl: 3   glycopyrrolate  (ROBINUL ) 2 MG tablet, Take 1 tablet (2 mg total) by mouth 3 (three) times daily., Disp: 90 tablet, Rfl: 11   insulin  NPH-regular Human (HUMULIN  70/30) (70-30) 100 UNIT/ML injection, INJECT SUBCUTANEOUSLY 40  UNITS TWICE DAILY, Disp: , Rfl:    Insulin  Pen Needle (NOVOFINE) 30G  X 8 MM MISC, Inject 10 each into the skin as needed., Disp: 100 each, Rfl: 0   Insulin  Syringes, Disposable, U-100 1 ML MISC, 50 Units by Does not apply route 2 (two) times daily. inject 50 units into skin 2 x /day, Disp: 200 each, Rfl: 3   isosorbide  mononitrate (IMDUR ) 30 MG 24 hr tablet, Take 1 tablet by mouth once daily, Disp: 90 tablet, Rfl: 0   Lancets (ONETOUCH DELICA PLUS LANCET33G) MISC, CHECK BLOOD SUGAR 3 TIMES  DAILY, Disp: 300 each, Rfl: 3   latanoprost  (XALATAN ) 0.005 % ophthalmic solution, Place 1 drop into both eyes at bedtime., Disp: , Rfl:    Magnesium  500 MG TABS, Take 500 mg by mouth in the morning., Disp: , Rfl:    metFORMIN  (GLUCOPHAGE -XR) 500 MG 24 hr tablet, TAKE 2 TABLETS BY MOUTH  TWICE DAILY WITH MEALS FOR  DIABETES, Disp: 400 tablet, Rfl: 1   metoprolol  succinate (TOPROL -XL) 25 MG 24 hr tablet, TAKE ONE-HALF TABLET BY MOUTH  DAILY, Disp: 45 tablet, Rfl: 3   montelukast  (SINGULAIR ) 10 MG tablet, TAKE 1 TABLET BY MOUTH DAILY FOR ALLERGIES, Disp: 100 tablet, Rfl: 2   nitroGLYCERIN  (NITROSTAT ) 0.4 MG SL tablet, DISSOLVE 1 TABLET UNDER THE  TONGUE EVERY 5 MINUTES AS NEEDED FOR CHEST PAIN. MAX OF 3 TABLETS IN 15 MINUTES. CALL 911 IF PAIN  PERSISTS., Disp: 125 tablet, Rfl: 2   pantoprazole  (PROTONIX ) 40 MG tablet, TAKE 1 TABLET BY MOUTH DAILY TO  PREVENT  HEARTBURN AND  INDIGESTION, Disp: 100 tablet, Rfl: 2   sodium chloride  1 g tablet, Take 1 tablet (1 g total) by mouth 2 (two) times daily with a meal., Disp: 60 tablet, Rfl: 0   tamsulosin  (FLOMAX ) 0.4 MG CAPS capsule, Take 0.4 mg by mouth at bedtime., Disp: , Rfl:    Vitamin E 450 MG (1000 UT) CAPS, Take 450 mg by mouth in the morning., Disp: , Rfl:    zinc gluconate 50 MG tablet, Take 50 mg by mouth in the morning., Disp: , Rfl:   No Known Allergies  Objective:   BP 124/82   Pulse (!) 58   Temp 98 F (36.7 C)   Ht 5' 10 (1.778 m)   Wt 209 lb 1.3 oz (94.8 kg)   SpO2 98%   BMI 30.00 kg/m      06/06/2024   10:27 AM 04/05/2024   10:16 AM 03/15/2024   10:13 AM  Vitals with BMI  Height 5' 10 5' 10 5' 10  Weight 209 lbs 1 oz 200 lbs 3 oz 185 lbs 3 oz  BMI 30 28.73 26.57  Systolic 124 118 879  Diastolic 82 80 72  Pulse 58 86 60     Physical Exam Vitals and nursing note reviewed.  Constitutional:      Appearance: Normal appearance. He is normal weight.  HENT:     Head: Normocephalic and atraumatic.  Skin:    General: Skin is warm and dry.     Capillary Refill: Capillary refill takes less than 2 seconds.  Neurological:     General: No focal deficit present.     Mental Status: He is alert and oriented to person, place, and time. Mental status is at baseline.  Psychiatric:        Mood and Affect: Mood normal.        Behavior: Behavior normal.        Thought Content: Thought content normal.        Judgment:  Judgment normal.     Assessment & Plan:  Type 2 diabetes mellitus with vascular disease (HCC) Assessment & Plan: A1c 8.3%. working with myself and Pharm D to get [place] and Ozempic . Will start tresiba (100U/mL) 60 units SQ daily (**take day after last dose of Humulin  70/30, do not take Humulin  70/30 and Tresiba the same day**) and ozempic  0.25 mg SQ once weekly for four weeks then 0.5mg  SQ weekly.  Will need to decrease basal 10% with increases in Ozempic .   Reiterated the importance of medication complaince, monitoring BG, highs and lows, and how to treat highs and lows.  Michael Yahnke remains very reluctant to take medications as prescribed due to fears of hypoglycemia and additionally is eating very sweet items. Provided counseling on these things. Will refer to Endo for further assistance.  Follow up in 1 month after starting ozempic   Orders: -     Ambulatory referral to Endocrinology     Follow up plan: Return in about 3 months (around 09/05/2024) for chronic follow-up with labs 1 week prior, DM.  Jeoffrey GORMAN Barrio, [name]

## 2024-06-08 ENCOUNTER — Other Ambulatory Visit: Payer: Self-pay

## 2024-06-08 NOTE — Progress Notes (Signed)
   06/08/2024  Patient ID: Michael Guzman, male   DOB: 10-04-1945, 78 y.o.   MRN: [id number]  06/08/24 PharmD F/U on DM: - conversation limited d/t patient being at [facility] at time of call - expresses ongoing hesitancy regarding 2nd dose of humulin  70/30 (40 units AM/PM prescribed), is going to consider taking half of evening dose as opposed to none whatsoever. Seldom has low BG but previous experiences with low BG (ambulance being called twice before d/t hypoglycemia) has led to him being more cautious  - made aware that he has been approved for both tresiba and ozempic  PAP, had already received a letter in the mail.   Follow-up: agreed on 1-2 wk telephone follow-up.   Future Appointments  Date Time Provider Department Center  06/20/2024  1:30 PM [name], [facility] None  06/21/2024 10:40 AM Beather Delon Gibson, PA [facility]  08/29/2024  8:15 AM [facility]  08/31/2024  9:00 AM [name], [name] [facility]  12/14/2024 10:20 AM [facility]-ANNUAL WELLNESS VISIT [facility]   Cadet Pandora, PharmD, BCGP Clinical Pharmacist  [phone]

## 2024-06-12 ENCOUNTER — Encounter: Payer: Medicare Other | Admitting: Internal Medicine

## 2024-06-19 ENCOUNTER — Encounter: Payer: Self-pay | Admitting: Family Medicine

## 2024-06-19 ENCOUNTER — Ambulatory Visit (INDEPENDENT_AMBULATORY_CARE_PROVIDER_SITE_OTHER): Admitting: Family Medicine

## 2024-06-19 ENCOUNTER — Other Ambulatory Visit: Payer: Self-pay | Admitting: Cardiology

## 2024-06-19 VITALS — BP 127/82 | HR 66 | Temp 97.9°F | Ht 70.0 in | Wt 208.8 lb

## 2024-06-19 DIAGNOSIS — J069 Acute upper respiratory infection, unspecified: Secondary | ICD-10-CM

## 2024-06-19 NOTE — Assessment & Plan Note (Signed)
 Reassured patient that symptoms and exam findings are most consistent with a viral upper respiratory infection and explained lack of efficacy of antibiotics against viruses.  Discussed expected course and features suggestive of secondary bacterial infection.  Continue supportive care. Increase fluid intake with water or electrolyte solution like pedialyte. Encouraged acetaminophen as needed for fever/pain. Encouraged salt water gargling, chloraseptic spray and throat lozenges. Encouraged OTC guaifenesin. Encouraged saline sinus flushes and/or neti with humidified air.

## 2024-06-19 NOTE — Progress Notes (Signed)
 Subjective:  HPI: CROSS JORGE is a 78 y.o. male presenting on 06/19/2024 for Acute Visit (Cold, cough, sneezing since last Wednesday /)   HPI Patient is in today for 6 days cough, sneezing, rhinorrhea, nasal congestion Denies fever, chills, body aches, nausea or vomiting, SOB, wheezing Has tried Nyquil and Dayquil No know exposure or home flu/covid test Managing his DM and decreased appetite with less insulin  as appropriate Symptoms overall improving  Review of Systems  All other systems reviewed and are negative.   Relevant past medical history reviewed and updated as indicated.   Past Medical History:  Diagnosis Date   Arthritis    CAD (coronary artery disease)    a.s/p DES to mid LAD and OM2 06/2012.   Chronic back pain    Chronically dry eyes    Diabetes mellitus without complication (HCC)    TYPE 2    DKA (diabetic ketoacidoses) 01/30/2017   Elevated PSA    being monitored   Fatty liver    GERD (gastroesophageal reflux disease)    Hx of radiation therapy    prostate , [facility]    Hypertension    Hypertriglyceridemia    Intermediate coronary syndrome (HCC) 07/26/2012   Nausea vomiting and diarrhea 11/11/2023   Neuromuscular disorder (HCC)    neuropathy feet and legs   Prostate cancer (HCC)    RADTIATION      Past Surgical History:  Procedure Laterality Date   APPENDECTOMY     CARDIAC SURGERY  ~2017   3 stent placed.   COLONOSCOPY  02/12/2010   Buccini   COLONOSCOPY WITH PROPOFOL  N/A 09/21/2022   Procedure: COLONOSCOPY WITH PROPOFOL ;  Surgeon: Wilhelmenia Aloha Raddle., MD;  Location: [facility];  Service: Gastroenterology;  Laterality: N/A;   ENDOSCOPIC MUCOSAL RESECTION N/A 09/21/2022   Procedure: ENDOSCOPIC MUCOSAL RESECTION;  Surgeon: Wilhelmenia Aloha Raddle., MD;  Location: [facility];  Service: Gastroenterology;  Laterality: N/A;   HEMOSTASIS CLIP PLACEMENT  09/21/2022   Procedure: HEMOSTASIS CLIP PLACEMENT;  Surgeon: Wilhelmenia Aloha Raddle., MD;  Location: [facility];  Service: Gastroenterology;;   LEFT HEART CATHETERIZATION WITH CORONARY ANGIOGRAM N/A 06/27/2012   Procedure: LEFT HEART CATHETERIZATION WITH CORONARY ANGIOGRAM;  Surgeon: Oneil Parchment, MD;  [facility] CATH LAB;  Service: Cardiovascular;  Laterality: N/A;   LEFT HEART CATHETERIZATION WITH CORONARY ANGIOGRAM N/A 07/25/2012   Procedure: LEFT HEART CATHETERIZATION WITH CORONARY ANGIOGRAM;  Surgeon: Wilbert JONELLE Bihari, MD;  Location: [facility];  Service: Cardiovascular;  Laterality: N/A;   PROSTATE BIOPSY     RADIOLOGY WITH ANESTHESIA N/A 12/03/2015   Procedure: MRI LUMBAR SPINE;  Surgeon: Medication Radiologist, MD;  Location: [facility];  Service: Radiology;  Laterality: N/A;   SUBMUCOSAL LIFTING INJECTION  09/21/2022   Procedure: SUBMUCOSAL LIFTING INJECTION;  Surgeon: Wilhelmenia Aloha Raddle., MD;  Location: [facility];  Service: Gastroenterology;;   TOTAL HIP ARTHROPLASTY Right 04/28/2019   Procedure: TOTAL HIP ARTHROPLASTY ANTERIOR APPROACH;  Surgeon: Yvone Rush, MD;  Location: [facility];  Service: Orthopedics;  Laterality: Right;    Allergies and medications reviewed and updated.   Current Outpatient Medications:    Ascorbic Acid (VITAMIN C) 1000 MG tablet, Take 1,000 mg by mouth in the morning., Disp: , Rfl:    aspirin  EC 81 MG tablet, Take 1 tablet (81 mg total) by mouth daily., Disp: 90 tablet, Rfl: 3   atorvastatin  (LIPITOR ) 10 MG tablet, Take 1 tablet (10 mg total) by mouth daily., Disp: 100 tablet, Rfl: 1  B Complex-C (SUPER B COMPLEX PO), Take 1 tablet by mouth in the morning., Disp: , Rfl:    Blood Glucose Monitoring Suppl DEVI, 1 each by Does not apply route in the morning, at noon, and at bedtime. May substitute to any manufacturer covered by patient's insurance., Disp: 1 each, Rfl: 0   Cholecalciferol  (VITAMIN D3) 250 MCG (10000 UT) capsule, Take 10,000 Units by mouth in the morning., Disp: , Rfl:    Continuous Glucose Sensor (FREESTYLE LIBRE 3  PLUS SENSOR) MISC, Use to monitor blood sugar continuously. Change sensor every 15 days., Disp: 2 each, Rfl: 6   gabapentin  (NEURONTIN ) 800 MG tablet, TAKE 1 TABLET BY MOUTH 3 TO 4 TIMES DAILY FOR PAIN, HOT FLASHES AND SWEATS, Disp: 400 tablet, Rfl: 2   glycopyrrolate  (ROBINUL ) 2 MG tablet, Take  1 tablet  3 x / day for Excessive Sweating  TAKE ONE TABLET BY                                                 MOUTH, Disp: 270 tablet, Rfl: 3   glycopyrrolate  (ROBINUL ) 2 MG tablet, Take 1 tablet (2 mg total) by mouth 3 (three) times daily., Disp: 90 tablet, Rfl: 11   insulin  NPH-regular Human (HUMULIN  70/30) (70-30) 100 UNIT/ML injection, INJECT SUBCUTANEOUSLY 40  UNITS TWICE DAILY, Disp: , Rfl:    Insulin  Pen Needle (NOVOFINE) 30G X 8 MM MISC, Inject 10 each into the skin as needed., Disp: 100 each, Rfl: 0   Insulin  Syringes, Disposable, U-100 1 ML MISC, 50 Units by Does not apply route 2 (two) times daily. inject 50 units into skin 2 x /day, Disp: 200 each, Rfl: 3   isosorbide  mononitrate (IMDUR ) 30 MG 24 hr tablet, Take 1 tablet by mouth once daily, Disp: 90 tablet, Rfl: 0   Lancets (ONETOUCH DELICA PLUS LANCET33G) MISC, CHECK BLOOD SUGAR 3 TIMES  DAILY, Disp: 300 each, Rfl: 3   latanoprost  (XALATAN ) 0.005 % ophthalmic solution, Place 1 drop into both eyes at bedtime., Disp: , Rfl:    Magnesium  500 MG TABS, Take 500 mg by mouth in the morning., Disp: , Rfl:    metFORMIN  (GLUCOPHAGE -XR) 500 MG 24 hr tablet, TAKE 2 TABLETS BY MOUTH  TWICE DAILY WITH MEALS FOR  DIABETES, Disp: 400 tablet, Rfl: 1   metoprolol  succinate (TOPROL -XL) 25 MG 24 hr tablet, TAKE ONE-HALF TABLET BY MOUTH  DAILY, Disp: 45 tablet, Rfl: 3   montelukast  (SINGULAIR ) 10 MG tablet, TAKE 1 TABLET BY MOUTH DAILY FOR ALLERGIES, Disp: 100 tablet, Rfl: 2   nitroGLYCERIN  (NITROSTAT ) 0.4 MG SL tablet, DISSOLVE 1 TABLET UNDER THE  TONGUE EVERY 5 MINUTES AS NEEDED FOR CHEST PAIN. MAX OF 3 TABLETS IN 15 MINUTES. CALL 911 IF PAIN  PERSISTS., Disp: 125  tablet, Rfl: 2   pantoprazole  (PROTONIX ) 40 MG tablet, TAKE 1 TABLET BY MOUTH DAILY TO  PREVENT HEARTBURN AND  INDIGESTION, Disp: 100 tablet, Rfl: 2   sodium chloride  1 g tablet, Take 1 tablet (1 g total) by mouth 2 (two) times daily with a meal., Disp: 60 tablet, Rfl: 0   tamsulosin  (FLOMAX ) 0.4 MG CAPS capsule, Take 0.4 mg by mouth at bedtime., Disp: , Rfl:    Vitamin E 450 MG (1000 UT) CAPS, Take 450 mg by mouth in the morning., Disp: , Rfl:    zinc gluconate  50 MG tablet, Take 50 mg by mouth in the morning., Disp: , Rfl:   No Known Allergies  Objective:   BP 127/82   Pulse 66   Temp 97.9 F (36.6 C)   Ht 5' 10 (1.778 m)   Wt 208 lb 12.8 oz (94.7 kg)   SpO2 98%   BMI 29.96 kg/m      06/19/2024    2:17 PM 06/06/2024   10:27 AM 04/05/2024   10:16 AM  Vitals with BMI  Height 5' 10 5' 10 5' 10  Weight 208 lbs 13 oz 209 lbs 1 oz 200 lbs 3 oz  BMI 29.96 30 28.73  Systolic 127 124 881  Diastolic 82 82 80  Pulse 66 58 86     Physical Exam Vitals and nursing note reviewed.  Constitutional:      Appearance: Normal appearance. He is normal weight.  HENT:     Head: Normocephalic and atraumatic.     Right Ear: Tympanic membrane, ear canal and external ear normal.     Left Ear: Tympanic membrane, ear canal and external ear normal.     Nose: Nose normal.     Mouth/Throat:     Mouth: Mucous membranes are dry.     Pharynx: Oropharynx is clear.  Eyes:     Extraocular Movements: Extraocular movements intact.     Conjunctiva/sclera: Conjunctivae normal.     Pupils: Pupils are equal, round, and reactive to light.  Cardiovascular:     Rate and Rhythm: Normal rate and regular rhythm.     Pulses: Normal pulses.     Heart sounds: Normal heart sounds.  Pulmonary:     Effort: Pulmonary effort is normal.     Breath sounds: Normal breath sounds.  Musculoskeletal:     Cervical back: No tenderness.  Lymphadenopathy:     Cervical: No cervical adenopathy.  Skin:    General: Skin is  warm and dry.     Capillary Refill: Capillary refill takes less than 2 seconds.  Neurological:     General: No focal deficit present.     Mental Status: He is alert and oriented to person, place, and time. Mental status is at baseline.  Psychiatric:        Mood and Affect: Mood normal.        Behavior: Behavior normal.        Thought Content: Thought content normal.        Judgment: Judgment normal.     Assessment & Plan:  Viral URI with cough Assessment & Plan: Reassured patient that symptoms and exam findings are most consistent with a viral upper respiratory infection and explained lack of efficacy of antibiotics against viruses.  Discussed expected course and features suggestive of secondary bacterial infection.  Continue supportive care. Increase fluid intake with water  or electrolyte solution like pedialyte. Encouraged acetaminophen  as needed for fever/pain. Encouraged salt water  gargling, chloraseptic spray and throat lozenges. Encouraged OTC guaifenesin. Encouraged saline sinus flushes and/or neti with humidified air.        Follow up plan: Return if symptoms worsen or fail to improve.  Jeoffrey GORMAN Barrio, [name]

## 2024-06-20 ENCOUNTER — Other Ambulatory Visit: Payer: Self-pay

## 2024-06-20 DIAGNOSIS — I739 Peripheral vascular disease, unspecified: Secondary | ICD-10-CM | POA: Diagnosis not present

## 2024-06-20 DIAGNOSIS — M19072 Primary osteoarthritis, left ankle and foot: Secondary | ICD-10-CM | POA: Diagnosis not present

## 2024-06-20 DIAGNOSIS — M19071 Primary osteoarthritis, right ankle and foot: Secondary | ICD-10-CM | POA: Diagnosis not present

## 2024-06-20 DIAGNOSIS — E1142 Type 2 diabetes mellitus with diabetic polyneuropathy: Secondary | ICD-10-CM | POA: Diagnosis not present

## 2024-06-20 DIAGNOSIS — M205X1 Other deformities of toe(s) (acquired), right foot: Secondary | ICD-10-CM | POA: Diagnosis not present

## 2024-06-20 DIAGNOSIS — M205X2 Other deformities of toe(s) (acquired), left foot: Secondary | ICD-10-CM | POA: Diagnosis not present

## 2024-06-20 NOTE — Progress Notes (Unsigned)
 Chief Complaint: Discuss colonoscopy  HPI:    Michael Guzman is a 78 year old African-American male, known to Dr. Wilhelmenia, with a past medical history as listed below including CAD status post DES to the mid LAD and OM2 in 2013, diabetes, prostate cancer status post radiation and multiple others, who was referred to me by [name], [name] for a consideration of a colonoscopy.    06/16/2022 office visit with Dr. Wilhelmenia and at that time discussed surveillance colonoscopy in July at which time he was found to have multiple polyps consistent with tubular adenomas.  Large 30 mm polyp in distal transverse colon was not removed.  Colonoscopy prep only fair.  At that time recommended a week of MiraLAX  prior to colonoscopy prep and Zofran  30 minutes before colonoscopy prep.    09/21/2022 colonoscopy at [facility] for excision of colon polyp with hemorrhoids, significant looping of the colon, polypoid lesion in the distal transverse colon just proximal to the tattoo and underneath it as well, complete removal was accomplished by piecemeal cold mucosal resection.  Repeat recommended 9 to 12 months for surveillance.  Pathology showed tubular adenoma.    01/05/2024 CT cardiac perfusion study showed a normal EF of 53%.    05/31/2024 CMP with a glucose of 196 and otherwise normal, hemoglobin A1c 8.3.  Past Medical History:  Diagnosis Date   Arthritis    CAD (coronary artery disease)    a.s/p DES to mid LAD and OM2 06/2012.   Chronic back pain    Chronically dry eyes    Diabetes mellitus without complication (HCC)    TYPE 2    DKA (diabetic ketoacidoses) 01/30/2017   Elevated PSA    being monitored   Fatty liver    GERD (gastroesophageal reflux disease)    Hx of radiation therapy    prostate , [facility]    Hypertension    Hypertriglyceridemia    Intermediate coronary syndrome (HCC) 07/26/2012   Nausea vomiting and diarrhea 11/11/2023   Neuromuscular disorder  (HCC)    neuropathy feet and legs   Prostate cancer (HCC)    RADTIATION     Past Surgical History:  Procedure Laterality Date   APPENDECTOMY     CARDIAC SURGERY  ~2017   3 stent placed.   COLONOSCOPY  02/12/2010   Buccini   COLONOSCOPY WITH PROPOFOL  N/A 09/21/2022   Procedure: COLONOSCOPY WITH PROPOFOL ;  Surgeon: Mansouraty, Aloha Raddle., MD;  Location: [facility];  Service: Gastroenterology;  Laterality: N/A;   ENDOSCOPIC MUCOSAL RESECTION N/A 09/21/2022   Procedure: ENDOSCOPIC MUCOSAL RESECTION;  Surgeon: Wilhelmenia Aloha Raddle., MD;  Location: [facility];  Service: Gastroenterology;  Laterality: N/A;   HEMOSTASIS CLIP PLACEMENT  09/21/2022   Procedure: HEMOSTASIS CLIP PLACEMENT;  Surgeon: Wilhelmenia Aloha Raddle., MD;  Location: [facility];  Service: Gastroenterology;;   LEFT HEART CATHETERIZATION WITH CORONARY ANGIOGRAM N/A 06/27/2012   Procedure: LEFT HEART CATHETERIZATION WITH CORONARY ANGIOGRAM;  Surgeon: Oneil Parchment, MD;  Location: [facility] CATH LAB;  Service: Cardiovascular;  Laterality: N/A;   LEFT HEART CATHETERIZATION WITH CORONARY ANGIOGRAM N/A 07/25/2012   Procedure: LEFT HEART CATHETERIZATION WITH CORONARY ANGIOGRAM;  Surgeon: Wilbert JONELLE Bihari, MD;  Location: [facility] CATH LAB;  Service: Cardiovascular;  Laterality: N/A;   PROSTATE BIOPSY     RADIOLOGY WITH ANESTHESIA N/A 12/03/2015   Procedure: MRI LUMBAR SPINE;  Surgeon: Medication Radiologist, MD;  Location: [facility];  Service: Radiology;  Laterality: N/A;   SUBMUCOSAL LIFTING INJECTION  09/21/2022  Procedure: SUBMUCOSAL LIFTING INJECTION;  Surgeon: Wilhelmenia Aloha Raddle., MD;  Location: [facility];  Service: Gastroenterology;;   TOTAL HIP ARTHROPLASTY Right 04/28/2019   Procedure: TOTAL HIP ARTHROPLASTY ANTERIOR APPROACH;  Surgeon: Yvone Rush, MD;  Location: [facility];  Service: Orthopedics;  Laterality: Right;    Current Outpatient Medications  Medication Sig Dispense Refill   Ascorbic Acid (VITAMIN C) 1000 MG tablet Take 1,000 mg  by mouth in the morning.     aspirin  EC 81 MG tablet Take 1 tablet (81 mg total) by mouth daily. 90 tablet 3   atorvastatin  (LIPITOR ) 10 MG tablet Take 1 tablet (10 mg total) by mouth daily. 100 tablet 1   B Complex-C (SUPER B COMPLEX PO) Take 1 tablet by mouth in the morning.     Blood Glucose Monitoring Suppl DEVI 1 each by Does not apply route in the morning, at noon, and at bedtime. May substitute to any manufacturer covered by patient's insurance. 1 each 0   Cholecalciferol  (VITAMIN D3) 250 MCG (10000 UT) capsule Take 10,000 Units by mouth in the morning.     Continuous Glucose Sensor (FREESTYLE LIBRE 3 PLUS SENSOR) MISC Use to monitor blood sugar continuously. Change sensor every 15 days. 2 each 6   gabapentin  (NEURONTIN ) 800 MG tablet TAKE 1 TABLET BY MOUTH 3 TO 4 TIMES DAILY FOR PAIN, HOT FLASHES AND SWEATS 400 tablet 2   glycopyrrolate  (ROBINUL ) 2 MG tablet Take  1 tablet  3 x / day for Excessive Sweating  TAKE ONE TABLET BY                                                 MOUTH 270 tablet 3   glycopyrrolate  (ROBINUL ) 2 MG tablet Take 1 tablet (2 mg total) by mouth 3 (three) times daily. 90 tablet 11   insulin  NPH-regular Human (HUMULIN  70/30) (70-30) 100 UNIT/ML injection INJECT SUBCUTANEOUSLY 40  UNITS TWICE DAILY     Insulin  Pen Needle (NOVOFINE) 30G X 8 MM MISC Inject 10 each into the skin as needed. 100 each 0   Insulin  Syringes, Disposable, U-100 1 ML MISC 50 Units by Does not apply route 2 (two) times daily. inject 50 units into skin 2 x /day 200 each 3   isosorbide  mononitrate (IMDUR ) 30 MG 24 hr tablet Take 1 tablet by mouth once daily 90 tablet 0   Lancets (ONETOUCH DELICA PLUS LANCET33G) MISC CHECK BLOOD SUGAR 3 TIMES  DAILY 300 each 3   latanoprost  (XALATAN ) 0.005 % ophthalmic solution Place 1 drop into both eyes at bedtime.     Magnesium  500 MG TABS Take 500 mg by mouth in the morning.     metFORMIN  (GLUCOPHAGE -XR) 500 MG 24 hr tablet TAKE 2 TABLETS BY MOUTH  TWICE DAILY WITH  MEALS FOR  DIABETES 400 tablet 1   metoprolol  succinate (TOPROL -XL) 25 MG 24 hr tablet TAKE ONE-HALF TABLET BY MOUTH  DAILY 45 tablet 3   montelukast  (SINGULAIR ) 10 MG tablet TAKE 1 TABLET BY MOUTH DAILY FOR ALLERGIES 100 tablet 2   nitroGLYCERIN  (NITROSTAT ) 0.4 MG SL tablet DISSOLVE 1 TABLET UNDER THE  TONGUE EVERY 5 MINUTES AS NEEDED FOR CHEST PAIN. MAX OF 3 TABLETS IN 15 MINUTES. CALL 911 IF PAIN  PERSISTS. 125 tablet 2   pantoprazole  (PROTONIX ) 40 MG tablet TAKE 1 TABLET BY MOUTH DAILY TO  PREVENT HEARTBURN AND  INDIGESTION 100 tablet 2   sodium chloride  1 g tablet Take 1 tablet (1 g total) by mouth 2 (two) times daily with a meal. 60 tablet 0   tamsulosin  (FLOMAX ) 0.4 MG CAPS capsule Take 0.4 mg by mouth at bedtime.     Vitamin E 450 MG (1000 UT) CAPS Take 450 mg by mouth in the morning.     zinc gluconate 50 MG tablet Take 50 mg by mouth in the morning.     No current facility-administered medications for this visit.    Allergies as of 06/21/2024   (No Known Allergies)    Family History  Problem Relation Age of Onset   Diabetes Sister    Stomach cancer Neg Hx    Colon cancer Neg Hx    Esophageal cancer Neg Hx    Inflammatory bowel disease Neg Hx    Liver disease Neg Hx    Pancreatic cancer Neg Hx    Rectal cancer Neg Hx     Social History   Socioeconomic History   Marital status: Married    Spouse name: Not on file   Number of children: 4   Years of education: 2 y colleg   Highest education level: Not on file  Occupational History   Occupation: SUPERVISOR    Employer: [employer]   Occupation: retired  Tobacco Use   Smoking status: Former    Current packs/day: 0.00    Average packs/day: 0.8 packs/day for 52.9 years (39.6 ttl pk-yrs)    Types: Cigarettes    Start date: 43    Quit date: 07/27/2011    Years since quitting: 12.9   Smokeless tobacco: Never  Vaping Use   Vaping status: Never Used  Substance and Sexual Activity   Alcohol use: Not  Currently    Comment: qut 20 years ago per patient    Drug use: No   Sexual activity: Not Currently  Other Topics Concern   Not on file  Social History Narrative   Works as    Social Drivers of Corporate investment banker Strain: Low Risk  (12/09/2023)   Overall Financial Resource Strain (CARDIA)    Difficulty of Paying Living Expenses: Not hard at all  Food Insecurity: No Food Insecurity (03/13/2024)   Hunger Vital Sign    Worried About Running Out of Food in the Last Year: Never true    Ran Out of Food in the Last Year: Never true  Transportation Needs: No Transportation Needs (03/13/2024)   PRAPARE - Administrator, Civil Service (Medical): No    Lack of Transportation (Non-Medical): No  Physical Activity: Sufficiently Active (12/09/2023)   Exercise Vital Sign    Days of Exercise per Week: 5 days    Minutes of Exercise per Session: 30 min  Stress: No Stress Concern Present (12/09/2023)   [employer] of Occupational Health - Occupational Stress Questionnaire    Feeling of Stress : Not at all  Social Connections: Moderately Integrated (03/06/2024)   Social Connection and Isolation Panel    Frequency of Communication with Friends and Family: More than three times a week    Frequency of Social Gatherings with Friends and Family: More than three times a week    Attends Religious Services: 1 to 4 times per year    Active Member of [employer] or Organizations: No    Attends Banker Meetings: 1 to 4 times per year    Marital Status:  Widowed  Intimate Partner Violence: Not At Risk (03/13/2024)   Humiliation, Afraid, Rape, and Kick questionnaire    Fear of Current or Ex-Partner: No    Emotionally Abused: No    Physically Abused: No    Sexually Abused: No    Review of Systems:    Constitutional: No weight loss, fever, chills, weakness or fatigue HEENT: Eyes: No change in vision               Ears, Nose, Throat:  No change in hearing or congestion Skin: No  rash or itching Cardiovascular: No chest pain, chest pressure or palpitations   Respiratory: No SOB or cough Gastrointestinal: See HPI and otherwise negative Genitourinary: No dysuria or change in urinary frequency Neurological: No headache, dizziness or syncope Musculoskeletal: No new muscle or joint pain Hematologic: No bleeding or bruising Psychiatric: No history of depression or anxiety    Physical Exam:  Vital signs: There were no vitals taken for this visit.  Constitutional:   Pleasant Caucasian male appears to be in NAD, Well developed, Well nourished, alert and cooperative Head:  Normocephalic and atraumatic. Eyes:   PEERL, EOMI. No icterus. Conjunctiva pink. Ears:  Normal auditory acuity. Neck:  Supple Throat: Oral cavity and pharynx without inflammation, swelling or lesion.  Respiratory: Respirations even and unlabored. Lungs clear to auscultation bilaterally.   No wheezes, crackles, or rhonchi.  Cardiovascular: Normal S1, S2. No MRG. Regular rate and rhythm. No peripheral edema, cyanosis or pallor.  Gastrointestinal:  Soft, nondistended, nontender. No rebound or guarding. Normal bowel sounds. No appreciable masses or hepatomegaly. Rectal:  Not performed.  Msk:  Symmetrical without gross deformities. Without edema, no deformity or joint abnormality.  Neurologic:  Alert and  oriented x4;  grossly normal neurologically.  Skin:   Dry and intact without significant lesions or rashes. Psychiatric: Oriented to person, place and time. Demonstrates good judgement and reason without abnormal affect or behaviors.  RELEVANT LABS AND IMAGING: CBC    Component Value Date/Time   WBC 5.8 03/09/2024 0444   RBC 4.18 (L) 03/09/2024 0444   HGB 12.2 (L) 03/09/2024 0444   HCT 35.9 (L) 03/09/2024 0444   PLT 252 03/09/2024 0444   MCV 85.9 03/09/2024 0444   MCH 29.2 03/09/2024 0444   MCHC 34.0 03/09/2024 0444   RDW 13.1 03/09/2024 0444   LYMPHSABS 1,734 05/06/2023 1501   MONOABS 0.7  01/11/2021 1433   EOSABS 160 02/25/2024 1111   BASOSABS 50 02/25/2024 1111    CMP     Component Value Date/Time   NA 137 05/31/2024 1015   K 4.4 05/31/2024 1015   CL 101 05/31/2024 1015   CO2 30 05/31/2024 1015   GLUCOSE 196 (H) 05/31/2024 1015   BUN 12 05/31/2024 1015   CREATININE 0.99 05/31/2024 1015   CALCIUM  9.7 05/31/2024 1015   PROT 7.0 05/31/2024 1015   ALBUMIN 3.6 03/10/2024 0435   AST 17 05/31/2024 1015   ALT 17 05/31/2024 1015   ALKPHOS 66 03/05/2024 1820   BILITOT 0.3 05/31/2024 1015   GFRNONAA >60 03/10/2024 0435   GFRNONAA 68 01/21/2021 0923   GFRAA 78 01/21/2021 0923    Assessment: 1.  History of adenomatous polyp: History of large 30 mm adenomatous polyp removed in January 2024 with repeat colonoscopy recommended 9 to 12 months for surveillance  Plan: 1. ***     Delon Failing, PA-C [facility] 06/20/2024, 3:35 PM  Cc: [name], [name]

## 2024-06-20 NOTE — Progress Notes (Signed)
   06/20/2024  Patient ID: Michael Guzman, male   DOB: Jan 05, 1946, 78 y.o.   MRN: [id number]   06/20/2024 Focused DM f/u  - getting over a respiratory infection, saw PCP yesterday 06/19/24 for URI - feels sugars have been decent, meaning typically <200 - continues to take AM dose Humulin  70/30 dose but is not wanting to take PM dose, again d/t fear of sugars dropping. Struggled with CGM and did not like the alarms.  - recently approved for PAP through [employer] for Tresiba and Ozempic , office has not received; per PAP orders are still in progress.  - discussed Medicare information and upcoming changes, encouraged [facility] Counselor outreach. Message sent to patient.  - reports not being aware of endocrine referral (placed 9/23) - encouraged to reach out to office if not hearing anything by next week.    Follow-up: agreed on 1-2 m telephone follow-up.   Future Appointments  Date Time Provider Department Center  06/21/2024 10:40 AM Beather Delon Gibson, [place] [facility]  08/29/2024  8:15 AM [facility]  08/31/2024  9:00 AM [name], [name] [facility]  12/14/2024 10:20 AM [facility]-ANNUAL WELLNESS VISIT [facility]   Michael Guzman, PharmD, BCGP Clinical Pharmacist  [phone]

## 2024-06-21 ENCOUNTER — Ambulatory Visit (INDEPENDENT_AMBULATORY_CARE_PROVIDER_SITE_OTHER): Admitting: Physician Assistant

## 2024-06-21 ENCOUNTER — Encounter: Payer: Self-pay | Admitting: Physician Assistant

## 2024-06-21 VITALS — BP 126/80 | HR 67 | Ht 70.0 in | Wt 208.6 lb

## 2024-06-21 DIAGNOSIS — Z860101 Personal history of adenomatous and serrated colon polyps: Secondary | ICD-10-CM

## 2024-06-21 DIAGNOSIS — D123 Benign neoplasm of transverse colon: Secondary | ICD-10-CM

## 2024-06-21 MED ORDER — METOCLOPRAMIDE HCL 10 MG PO TABS: ORAL_TABLET | ORAL | 0 refills | Status: AC

## 2024-06-21 NOTE — Patient Instructions (Signed)
 Dr. Wilhelmenia will call to schedule procedure for the hospital.  Start Miralax  1 capful daily in 8 ounces of liquid for 1 week prior to procedure.   We have sent the following medications to your pharmacy for you to pick up at your convenience: Reglan  10 mg take 1 tablet 20-30 minutes before drinking colonoscopy prep.

## 2024-06-21 NOTE — Progress Notes (Signed)
 Attending Physician's Attestation   I have reviewed the chart.   I agree with the Advanced Practitioner's note, impression, and recommendations with any updates as below. Sounds like patient is amenable to considering repeat colonoscopy.  Should he change his mind in the coming weeks, we can certainly defer at that point if he feels his risks are above the potential benefits.  He can be scheduled for colonoscopy 60-minute EMR in the next 3 months.   Aloha Finner, MD [facility] Advanced Endoscopy Office # [phone]

## 2024-06-22 ENCOUNTER — Telehealth: Payer: Self-pay

## 2024-06-22 ENCOUNTER — Other Ambulatory Visit: Payer: Self-pay

## 2024-06-22 DIAGNOSIS — D123 Benign neoplasm of transverse colon: Secondary | ICD-10-CM

## 2024-06-22 MED ORDER — NA SULFATE-K SULFATE-MG SULF 17.5-3.13-1.6 GM/177ML PO SOLN: 1.0000 | Freq: Once | ORAL | 0 refills | Status: AC

## 2024-06-22 NOTE — Telephone Encounter (Signed)
 Colon EMR has been set up for 08/21/24 at [facility] with GM at 1030 am   Left message on machine to call back

## 2024-06-22 NOTE — Telephone Encounter (Signed)
-----   Message from [facility] sent at 06/21/2024  5:04 PM EDT ----- Regarding: RE: Colonoscopy in the hospital Agree with this. GM ----- Message ----- From: Beather Delon Gibson, PA Sent: 06/21/2024  11:15 AM EDT To: Odetta LITTIE Curly, RN; Aloha Wilhelmenia Raddle., # Subject: Colonoscopy in the hospital                    Patient needs colonoscopy at the hospital in a 60-minute time slot for history of adenomatous polyp with Dr. Wilhelmenia.  Thanks, JLL

## 2024-06-23 ENCOUNTER — Telehealth: Payer: Self-pay

## 2024-06-23 NOTE — Telephone Encounter (Signed)
 Patient assistance items arrived today   5X 3ml prefilled- 100 U/ML Pen [place] - NDC: [id number]/ Lot: [id number]  EXP: 10-14-2026  4x 3ml prefilled -0.25 ml pen Ozempic  - WIR:[id number]  LOT: [id number]  EXP: 10-14-2026  100X 32 G TIP NOVO FINE LANCETS NDC: [id number]  ONU:[id number]  EXP: 09-13-2028  PT  Was alerted by phone. At number [phone] he stated he would be in to the office to pick up products the following week.   Ozempic  and [place] are placed in sample fridge and Novo fine is with the front desk in the pt. Sample locker.   06/23/24  SRP, CMA 10: 57 am.

## 2024-06-23 NOTE — Telephone Encounter (Signed)
 Colon EMR scheduled, pt instructed and medications reviewed.  Patient instructions mailed to home.  Patient to call with any questions or concerns.

## 2024-06-26 ENCOUNTER — Telehealth: Payer: Self-pay | Admitting: Family Medicine

## 2024-06-26 ENCOUNTER — Telehealth: Payer: Self-pay

## 2024-06-26 NOTE — Telephone Encounter (Signed)
 PT is calling to find out if there is a cheaper alternative for his prep medication. Requesting call back.

## 2024-06-26 NOTE — Telephone Encounter (Signed)
 Prescription Request  06/26/2024  LOV: 06/19/2024  What is the name of the medication or equipment?   pantoprazole  (PROTONIX ) 40 MG tablet  **Patient has had difficulty refilling this med**  Have you contacted your pharmacy to request a refill? Yes   Which pharmacy would you like this sent to?  [facility] - [place], [place] - [address] [place]  [address] Phone: [phone] Fax: [phone]    Patient notified that their request is being sent to the clinical staff for review and that they should receive a response within 2 business days.   Please advise patient at [phone].

## 2024-06-26 NOTE — Telephone Encounter (Signed)
 The pt has been advised that he can use [facility] to help with the cost of his medications. He will call back with any further questions or concerns

## 2024-06-27 NOTE — Telephone Encounter (Signed)
 Too soon to order medication thru insurance . Last fill date of the 100 dispense was 04/26/2024 Next refill date will be 07/04/2024

## 2024-06-28 ENCOUNTER — Telehealth: Payer: Self-pay

## 2024-06-28 NOTE — Progress Notes (Signed)
   06/28/2024  Patient ID: Carlin CHRISTELLA Carmin, male   DOB: 01-Aug-1946, 78 y.o.   MRN: [id number]  Incoming call received from patient. States that he has picked up his tresiba and ozempic  from the pharmacy. Clarified instructions and counseled on appropriate use of tresiba and ozempic . Has not taken 70/30 today and will stop taking this medication. 1 week f/u call scheduled.   Future Appointments  Date Time Provider Department Center  07/04/2024 10:00 AM [name], [facility] None  07/18/2024  1:30 PM [name], [facility] None  08/23/2024  2:00 PM Therisa Benton PARAS, NP REA-REA None  08/29/2024  8:15 AM [facility]  08/31/2024  9:00 AM [name], [name] [facility]  12/14/2024 10:20 AM [facility]-ANNUAL WELLNESS VISIT [facility]    Cadet Pandora, PharmD, BCGP Clinical Pharmacist  [phone]

## 2024-07-03 ENCOUNTER — Encounter: Payer: Self-pay | Admitting: Family Medicine

## 2024-07-04 ENCOUNTER — Other Ambulatory Visit: Payer: Self-pay

## 2024-07-04 NOTE — Progress Notes (Signed)
   07/04/2024  Patient ID: Michael Guzman, male   DOB: Dec 04, 1945, 78 y.o.   MRN: [id number]   07/04/2024 Focused DM f/u  - started ozempic  0.25mg  once weekly last Wednesday 06/28/2024.  - started Tresiba 20 units daily last Wednesday 06/28/2024 with plans to titrate until FBG <130.  - his 70/30 has been discontinued, last dose was multiple days before starting tresiba.  - has not taken [place] since last Friday 06/30/2024 due to experiencing lows and his stomach feeling off.  - describes not feeling hungry and barely eating anything for a few days following ozempic  injection. Today notes stomach is doing better and that he is hungry and has plans to prepare breakfast. Feels he will likely inject 2nd dose of ozempic  0.25mg  tomorrow 07/05/2024 - had never set PCP appt for this week, is requesting for someone at [facility] to reach out to him as he experiences great difficulty navigating E2C2   - hypoglycemia management and prevention reviewed in detail - again emphasized the importance of smaller more frequent low fat meals to help regulate stomach discomfort related to ozempic .  - reviewed the few CGM readings patient was able to provide verbally over the phone, dropped to 69 last night, awoke to sound of low BG alarm. Drank sugar water . At time of our call near 9am, readings were around 130s. Attempted to sych my portal with patients herlene account again however he had trouble logging into his account/didn't know his user ID/password. He will play around with it and hopefully connect us .    Follow-up: agreed on Thursday or Friday telephone follow-up. Encouraged patient to reach out to my direct line with any questions. Insurance/PAP changes will need to be addressed at f/u. D/t patient confusion/side effects over recent changes, decided to hold off on this discussion.   Future Appointments  Date Time Provider Department Center  07/04/2024 10:00 AM [name], [facility] None  07/18/2024  1:30  PM [name], [facility] None  08/23/2024  2:00 PM Therisa Benton PARAS, NP REA-REA None  08/29/2024  8:15 AM [facility]  08/31/2024  9:00 AM [name], [name] [facility]  12/14/2024 10:20 AM [facility]-ANNUAL WELLNESS VISIT [facility]   Cadet Pandora, PharmD, BCGP Clinical Pharmacist  [phone]

## 2024-07-05 ENCOUNTER — Other Ambulatory Visit: Payer: Self-pay | Admitting: Cardiology

## 2024-07-06 ENCOUNTER — Ambulatory Visit (INDEPENDENT_AMBULATORY_CARE_PROVIDER_SITE_OTHER): Admitting: Family Medicine

## 2024-07-06 ENCOUNTER — Encounter: Payer: Self-pay | Admitting: Family Medicine

## 2024-07-06 VITALS — BP 128/72 | HR 64 | Temp 98.1°F | Ht 70.0 in | Wt 205.6 lb

## 2024-07-06 DIAGNOSIS — E119 Type 2 diabetes mellitus without complications: Secondary | ICD-10-CM | POA: Insufficient documentation

## 2024-07-06 DIAGNOSIS — Z7985 Long-term (current) use of injectable non-insulin antidiabetic drugs: Secondary | ICD-10-CM | POA: Diagnosis not present

## 2024-07-06 DIAGNOSIS — E1159 Type 2 diabetes mellitus with other circulatory complications: Secondary | ICD-10-CM | POA: Diagnosis not present

## 2024-07-06 DIAGNOSIS — I2510 Atherosclerotic heart disease of native coronary artery without angina pectoris: Secondary | ICD-10-CM | POA: Insufficient documentation

## 2024-07-06 MED ORDER — PROMETHAZINE HCL 25 MG PO TABS: 25.0000 mg | ORAL_TABLET | Freq: Three times a day (TID) | ORAL | 0 refills | Status: AC | PRN

## 2024-07-06 NOTE — Telephone Encounter (Signed)
 Pt. In office today. Seen by Dr. Duanne.

## 2024-07-06 NOTE — Progress Notes (Signed)
 Subjective:    Patient ID: Michael Guzman, male    DOB: Apr 19, 1946, 78 y.o.   MRN: [id number]  HPI  Patient normally sees my partner.  He was on 70/30 insulin .  He states he was taking approximately 50 units in the morning and 40 units in the evening.  However he is very vague with that and I am not certain if he was consistently doing it.  His hemoglobin A1c was 8.3 on that regimen.  Recently my partner transitioned him to [place] coupled with Ozempic  0.25 mg subcu weekly.  Patient states that he was having hypoglycemic episodes.  However he only took the [place] 1 time.  He immediately stopped it.  He stopped that because he started feeling extremely nauseated and was experiencing vomiting and gas and belching and abdominal distention.  He is 1 week out from having taken his first dose of Ozempic  and his symptoms are gradually starting to improve.  Patient refuses to take [place] or Ozempic  again Past Medical History:  Diagnosis Date   Arthritis    CAD (coronary artery disease)    a.s/p DES to mid LAD and OM2 06/2012.   Chronic back pain    Chronically dry eyes    Diabetes mellitus without complication (HCC)    TYPE 2    DKA (diabetic ketoacidoses) 01/30/2017   Elevated PSA    being monitored   Fatty liver    GERD (gastroesophageal reflux disease)    Hx of radiation therapy    prostate , [facility]    Hypertension    Hypertriglyceridemia    Intermediate coronary syndrome (HCC) 07/26/2012   Nausea vomiting and diarrhea 11/11/2023   Neuromuscular disorder (HCC)    neuropathy feet and legs   Prostate cancer (HCC)    RADTIATION    Past Surgical History:  Procedure Laterality Date   APPENDECTOMY     CARDIAC SURGERY  ~2017   3 stent placed.   COLONOSCOPY  02/12/2010   Buccini   COLONOSCOPY WITH PROPOFOL  N/A 09/21/2022   Procedure: COLONOSCOPY WITH PROPOFOL ;  Surgeon: Mansouraty, Aloha Raddle., MD;  Location: [facility];  Service: Gastroenterology;  Laterality:  N/A;   ENDOSCOPIC MUCOSAL RESECTION N/A 09/21/2022   Procedure: ENDOSCOPIC MUCOSAL RESECTION;  Surgeon: Wilhelmenia Aloha Raddle., MD;  Location: [facility];  Service: Gastroenterology;  Laterality: N/A;   HEMOSTASIS CLIP PLACEMENT  09/21/2022   Procedure: HEMOSTASIS CLIP PLACEMENT;  Surgeon: Wilhelmenia Aloha Raddle., MD;  Location: [facility];  Service: Gastroenterology;;   LEFT HEART CATHETERIZATION WITH CORONARY ANGIOGRAM N/A 06/27/2012   Procedure: LEFT HEART CATHETERIZATION WITH CORONARY ANGIOGRAM;  Surgeon: Oneil Parchment, MD;  Location: [facility] CATH LAB;  Service: Cardiovascular;  Laterality: N/A;   LEFT HEART CATHETERIZATION WITH CORONARY ANGIOGRAM N/A 07/25/2012   Procedure: LEFT HEART CATHETERIZATION WITH CORONARY ANGIOGRAM;  Surgeon: Wilbert JONELLE Bihari, MD;  Location: [facility] CATH LAB;  Service: Cardiovascular;  Laterality: N/A;   PROSTATE BIOPSY     RADIOLOGY WITH ANESTHESIA N/A 12/03/2015   Procedure: MRI LUMBAR SPINE;  Surgeon: Medication Radiologist, MD;  Location: [facility] OR;  Service: Radiology;  Laterality: N/A;   SUBMUCOSAL LIFTING INJECTION  09/21/2022   Procedure: SUBMUCOSAL LIFTING INJECTION;  Surgeon: Wilhelmenia Aloha Raddle., MD;  Location: [facility];  Service: Gastroenterology;;   TOTAL HIP ARTHROPLASTY Right 04/28/2019   Procedure: TOTAL HIP ARTHROPLASTY ANTERIOR APPROACH;  Surgeon: Yvone Rush, MD;  Location: [facility];  Service: Orthopedics;  Laterality: Right;   Current Outpatient Medications on File Prior to Visit  Medication Sig Dispense Refill   Ascorbic Acid (VITAMIN C) 1000 MG tablet Take 1,000 mg by mouth in the morning.     aspirin  EC 81 MG tablet Take 1 tablet (81 mg total) by mouth daily. 90 tablet 3   atorvastatin  (LIPITOR ) 10 MG tablet Take 1 tablet (10 mg total) by mouth daily. 100 tablet 1   B Complex-C (SUPER B COMPLEX PO) Take 1 tablet by mouth in the morning.     Blood Glucose Monitoring Suppl DEVI 1 each by Does not apply route in the morning, at noon, and at bedtime. May  substitute to any manufacturer covered by patient's insurance. 1 each 0   Cholecalciferol  (VITAMIN D3) 250 MCG (10000 UT) capsule Take 10,000 Units by mouth in the morning.     Continuous Glucose Sensor (FREESTYLE LIBRE 3 PLUS SENSOR) MISC Use to monitor blood sugar continuously. Change sensor every 15 days. 2 each 6   gabapentin  (NEURONTIN ) 800 MG tablet TAKE 1 TABLET BY MOUTH 3 TO 4 TIMES DAILY FOR PAIN, HOT FLASHES AND SWEATS 400 tablet 2   Insulin  Pen Needle (NOVOFINE) 30G X 8 MM MISC Inject 10 each into the skin as needed. 100 each 0   Insulin  Syringes, Disposable, U-100 1 ML MISC 50 Units by Does not apply route 2 (two) times daily. inject 50 units into skin 2 x /day 200 each 3   isosorbide  mononitrate (IMDUR ) 30 MG 24 hr tablet Take 1 tablet by mouth once daily 90 tablet 0   Lancets (ONETOUCH DELICA PLUS LANCET33G) MISC CHECK BLOOD SUGAR 3 TIMES  DAILY 300 each 3   latanoprost  (XALATAN ) 0.005 % ophthalmic solution Place 1 drop into both eyes at bedtime.     Magnesium  500 MG TABS Take 500 mg by mouth in the morning.     metFORMIN  (GLUCOPHAGE -XR) 500 MG 24 hr tablet TAKE 2 TABLETS BY MOUTH  TWICE DAILY WITH MEALS FOR  DIABETES 400 tablet 1   metoCLOPramide  (REGLAN ) 10 MG tablet Take 1 tablet 20-30 minutes before drinking colonoscopy prep. 2 tablet 0   metoprolol  succinate (TOPROL -XL) 25 MG 24 hr tablet TAKE ONE-HALF TABLET BY MOUTH  DAILY 45 tablet 3   montelukast  (SINGULAIR ) 10 MG tablet TAKE 1 TABLET BY MOUTH DAILY FOR ALLERGIES 100 tablet 2   nitroGLYCERIN  (NITROSTAT ) 0.4 MG SL tablet DISSOLVE 1 TABLET UNDER THE  TONGUE EVERY 5 MINUTES AS NEEDED FOR CHEST PAIN. MAX OF 3 TABLETS IN 15 MINUTES. CALL 911 IF PAIN  PERSISTS. 125 tablet 2   pantoprazole  (PROTONIX ) 40 MG tablet TAKE 1 TABLET BY MOUTH DAILY TO  PREVENT HEARTBURN AND  INDIGESTION 100 tablet 2   sodium chloride  1 g tablet Take 1 tablet (1 g total) by mouth 2 (two) times daily with a meal. 60 tablet 0   tamsulosin  (FLOMAX ) 0.4 MG CAPS  capsule Take 0.4 mg by mouth at bedtime.     Vitamin E 450 MG (1000 UT) CAPS Take 450 mg by mouth in the morning.     zinc gluconate 50 MG tablet Take 50 mg by mouth in the morning.     glycopyrrolate  (ROBINUL ) 2 MG tablet Take  1 tablet  3 x / day for Excessive Sweating  TAKE ONE TABLET BY  MOUTH 270 tablet 3   glycopyrrolate  (ROBINUL ) 2 MG tablet Take 1 tablet (2 mg total) by mouth 3 (three) times daily. 90 tablet 11   Insulin  Degludec (TRESIBA) 100 UNIT/ML SOLN Inject 20 Units into the skin daily. (Patient not taking: Reported on 07/06/2024)     Semaglutide ,0.25 or 0.5MG /DOS, (OZEMPIC , 0.25 OR 0.5 MG/DOSE,) 2 MG/3ML SOPN Inject 0.25 mg into the skin once a week. 0.25mg  once weekly for 4 weeks, then increase to 0.5mg  once weekly (Patient not taking: Reported on 07/06/2024)     No current facility-administered medications on file prior to visit.   No Known Allergies Social History   Socioeconomic History   Marital status: Married    Spouse name: Not on file   Number of children: 4   Years of education: 2 y colleg   Highest education level: Not on file  Occupational History   Occupation: SUPERVISOR    Employer: [employer]   Occupation: retired  Tobacco Use   Smoking status: Former    Current packs/day: 0.00    Average packs/day: 0.8 packs/day for 52.9 years (39.6 ttl pk-yrs)    Types: Cigarettes    Start date: 64    Quit date: 07/27/2011    Years since quitting: 12.9   Smokeless tobacco: Never  Vaping Use   Vaping status: Never Used  Substance and Sexual Activity   Alcohol use: Not Currently    Comment: qut 20 years ago per patient    Drug use: No   Sexual activity: Not Currently  Other Topics Concern   Not on file  Social History Narrative   Works as    Social Drivers of Corporate investment banker Strain: Low Risk  (12/09/2023)   Overall Financial Resource Strain (CARDIA)    Difficulty of Paying Living  Expenses: Not hard at all  Food Insecurity: No Food Insecurity (03/13/2024)   Hunger Vital Sign    Worried About Running Out of Food in the Last Year: Never true    Ran Out of Food in the Last Year: Never true  Transportation Needs: No Transportation Needs (03/13/2024)   PRAPARE - Administrator, Civil Service (Medical): No    Lack of Transportation (Non-Medical): No  Physical Activity: Sufficiently Active (12/09/2023)   Exercise Vital Sign    Days of Exercise per Week: 5 days    Minutes of Exercise per Session: 30 min  Stress: No Stress Concern Present (12/09/2023)   [employer] of Occupational Health - Occupational Stress Questionnaire    Feeling of Stress : Not at all  Social Connections: Moderately Integrated (03/06/2024)   Social Connection and Isolation Panel    Frequency of Communication with Friends and Family: More than three times a week    Frequency of Social Gatherings with Friends and Family: More than three times a week    Attends Religious Services: 1 to 4 times per year    Active Member of [employer] or Organizations: No    Attends Banker Meetings: 1 to 4 times per year    Marital Status: Widowed  Intimate Partner Violence: Not At Risk (03/13/2024)   Humiliation, Afraid, Rape, and Kick questionnaire    Fear of Current or Ex-Partner: No    Emotionally Abused: No    Physically Abused: No    Sexually Abused: No    Review of Systems  All other systems reviewed and are negative.      Objective:   Physical Exam Vitals  reviewed.  Constitutional:      Appearance: Normal appearance. He is normal weight.  Cardiovascular:     Rate and Rhythm: Normal rate and regular rhythm.     Heart sounds: Normal heart sounds.  Pulmonary:     Effort: Pulmonary effort is normal.     Breath sounds: Normal breath sounds.  Abdominal:     General: Bowel sounds are normal.     Palpations: Abdomen is soft.  Neurological:     Mental Status: He is alert.            Assessment & Plan:  Type 2 diabetes mellitus with vascular disease (HCC) I tried to explain to the patient that the nausea was due to the Ozempic  however he refuses to take triseba again.  He also refuses to try Ozempic  again.  Therefore I recommended that we start over with his 70/30 insulin .  I would recommend 60 units in the morning and 40 units in the evening.  I will see the patient back in 1 week with fasting blood sugars and 2-hour postprandial suppertime sugars so that we can uptitrate his insulin .  I explained to the patient that I would increase his morning insulin  to achieve better nighttime sugars and I would increase his evening insulin  to achieve better fasting sugars

## 2024-07-06 NOTE — Telephone Encounter (Signed)
 Pt. Scheduled w/ Dr. Duanne today . Amber stated she would send a update message to Dr. Duanne for information to assist.

## 2024-07-07 ENCOUNTER — Other Ambulatory Visit: Payer: Self-pay

## 2024-07-14 ENCOUNTER — Other Ambulatory Visit: Payer: Self-pay

## 2024-07-18 ENCOUNTER — Other Ambulatory Visit: Payer: Self-pay

## 2024-07-18 NOTE — Progress Notes (Signed)
 07/18/2024 Name: Michael Guzman MRN: [id number] DOB: [birth year]  Chief Complaint  Patient presents with   Medication Management   Diabetes    Michael Guzman is a 78 y.o. year old male who presented for a telephone visit.   They were referred to the pharmacist by their PCP for assistance in managing diabetes.    Subjective:  Care Team: Primary Care Provider: [name], [name] ; Next Scheduled Visit:  Future Appointments  Date Time Provider Department Center  08/23/2024  2:00 PM Therisa Benton PARAS, NP [facility] None  08/29/2024  8:15 AM [facility]  08/31/2024  9:00 AM [name], [name] [facility]  09/21/2024 10:00 AM [facility]PHARMACIST [facility] [address]  12/14/2024 10:20 AM [facility]-ANNUAL WELLNESS VISIT [facility]     Medication Access/Adherence  Current Pharmacy:  [facility] - [place], [place] - [address] [place] [address] Phone: [phone] Fax: [phone]   Patient reports affordability concerns with their medications: No  Patient reports access/transportation concerns to their pharmacy: No  Patient reports adherence concerns with their medications:  no direct concerns however, medications are often adjusted, stopped    Diabetes:  Current medications:  -Metformin  1000mg  BID -Humulin  70/30 - no consistent dosing scheme established per patient. Most consistent has been 40 units every morning.   Medications tried in the past:  -Ozempic  + Tresiba  started 07/05/2024 after being approved for patient assistance, took each one time then stopped d/t stomach discomfort, no vomiting or diarrhea. Also mentions reflux in throat or sense of nausea coming from his throat. D/t uncertainty of what could have caused discomfort, had stopped both tresiba and ozempic  at once. Is not wanting to restart either at this time.  -Invokana  - stopped d/t inc. Urinary frequency   Glucose  readings since  07/07/2024 (AM,PM): -AM 134, PM 60  -105, 145 -150, 100 -90, 120 -116, 129 -110, 145 -120, 245 (30 units Saturday evening 07/15/24) - only day 70/30 had been used at all since seeing Dr Duanne last week.   Patient denies hypoglycemic s/sx including dizziness, shakiness, sweating. Patient denies hyperglycemic symptoms including polyuria, polydipsia, polyphagia, nocturia, neuropathy, blurred vision.  Current physical activity: active outdoors  Current medication access support: had been approved for ozempic  or tresiba 06/2024 but no longer taking.    Objective:  Lab Results  Component Value Date   HGBA1C 8.3 (H) 05/31/2024    Lab Results  Component Value Date   CREATININE 0.99 05/31/2024   BUN 12 05/31/2024   NA 137 05/31/2024   K 4.4 05/31/2024   CL 101 05/31/2024   CO2 30 05/31/2024    Lab Results  Component Value Date   CHOL 142 02/25/2024   HDL 60 02/25/2024   LDLCALC 63 02/25/2024   LDLDIRECT 45.7 10/30/2013   TRIG 100 02/25/2024   CHOLHDL 2.4 02/25/2024    Assessment/Plan:   Diabetes: - Currently uncontrolled; goal A1c <7%. Cardiorenal risk reduction is optimized.. Blood pressure is at goal <130/80. LDL is at goal.  - Reviewed long term cardiovascular and renal outcomes of uncontrolled blood sugar., Reviewed goal A1c, goal fasting, and goal 2 hour post prandial glucose. Recommended to check glucose CONTINUOUSLY, Reviewed lifestyle modifications including staying active outdoors., and Reviewed signs and symptoms of hypoglycemia. - has upcoming appt with endocrinology and PCP next month  Follow Up Plan: telephone visit following endo and pcp visit.   Lang Sieve, PharmD,  BCGP Clinical Pharmacist  [phone]

## 2024-08-01 ENCOUNTER — Other Ambulatory Visit: Payer: Self-pay | Admitting: Family Medicine

## 2024-08-14 ENCOUNTER — Encounter (HOSPITAL_COMMUNITY): Payer: Self-pay | Admitting: Gastroenterology

## 2024-08-14 ENCOUNTER — Telehealth: Payer: Self-pay | Admitting: Gastroenterology

## 2024-08-14 NOTE — Progress Notes (Signed)
 Attempted to obtain medical history for pre op call via telephone, unable to reach at this time. HIPAA compliant voicemail message left requesting return call to pre surgical testing department.

## 2024-08-14 NOTE — Telephone Encounter (Addendum)
 Procedure:Colonoscopy Procedure date: 08/21/24 Procedure location: [facility] Arrival Time: 9:00 am Spoke with the patient Y/N: Yes Any prep concerns? No Has the patient obtained the prep from the pharmacy ? Yes Do you have a care partner and transportation: Yes Any additional concerns? No

## 2024-08-15 NOTE — Progress Notes (Signed)
 Pre op call Michael Guzman   PCP-Howard NP Cardiologist- Jordan MD Pulmonologist-n/a  Chest Xray-n/a EKG-03/07/24 Echo-2019 Cath-n/a Stress-2020, CT stress April 2025 ICD/PM-n/a GLP1-n/a- not on  Blood Thinner-n/a  History: CAD,HTN,Prostate CA, DM. Last saw his cardiologist in Jan 2025 and was having some chest pains so they recommended stress test which he said he had and was good. I aksed patient about this, he said its really more when he does straining activity but not chest pains just gets tired easy. Denies chest pains/SOB, or using any equipment.  Anesthesia Review- Yes- test done in April okay to proceed

## 2024-08-17 ENCOUNTER — Telehealth: Payer: Self-pay

## 2024-08-17 NOTE — Telephone Encounter (Addendum)
 [facility] FAXED IN PHYSICIANS ORDER FOR CHRONIC CONDITION VERIFICATION FORM ON 08/14/2024 .   CHRONIC CONDITIONS INCLUDE :  *DIABETES,  *CARDIOVASCULAR DISORDERS   MEDICRE ID NUMBER [id number]   ORDER REVIEWED AND SIGNED BY PROVIDER AMBER HOWARD ON 08/15/2024 FAXED TO [facility] ON 12/04/025 TO [phone] FAXED BY CMA, SRP @ 4 PM *

## 2024-08-18 NOTE — Telephone Encounter (Signed)
 Encounter made in error.

## 2024-08-21 ENCOUNTER — Other Ambulatory Visit: Payer: Self-pay

## 2024-08-21 ENCOUNTER — Encounter (HOSPITAL_COMMUNITY): Payer: Self-pay | Admitting: Registered Nurse

## 2024-08-21 ENCOUNTER — Encounter (HOSPITAL_COMMUNITY): Payer: Self-pay | Admitting: Gastroenterology

## 2024-08-21 ENCOUNTER — Encounter (HOSPITAL_COMMUNITY): Admission: RE | Disposition: A | Payer: Self-pay | Source: Home / Self Care | Attending: Gastroenterology

## 2024-08-21 ENCOUNTER — Ambulatory Visit (HOSPITAL_COMMUNITY): Payer: Self-pay | Admitting: Registered Nurse

## 2024-08-21 ENCOUNTER — Ambulatory Visit (HOSPITAL_COMMUNITY)
Admission: RE | Admit: 2024-08-21 | Discharge: 2024-08-21 | Disposition: A | Attending: Gastroenterology | Admitting: Gastroenterology

## 2024-08-21 DIAGNOSIS — D123 Benign neoplasm of transverse colon: Secondary | ICD-10-CM

## 2024-08-21 HISTORY — PX: POLYPECTOMY: SHX149

## 2024-08-21 HISTORY — PX: COLONOSCOPY: SHX5424

## 2024-08-21 LAB — GLUCOSE, CAPILLARY
Glucose-Capillary: 194 mg/dL — ABNORMAL HIGH (ref 70–99)
Glucose-Capillary: 155 mg/dL — ABNORMAL HIGH (ref 70–99)

## 2024-08-21 SURGERY — COLONOSCOPY
Anesthesia: Monitor Anesthesia Care

## 2024-08-21 MED ORDER — PROPOFOL 1000 MG/100ML IV EMUL
INTRAVENOUS | Status: AC
  Filled 2024-08-21: qty 100

## 2024-08-21 MED ORDER — PROPOFOL 500 MG/50ML IV EMUL
INTRAVENOUS | Status: DC | PRN
  Administered 2024-08-21: 20 mg via INTRAVENOUS
  Administered 2024-08-21: 140 ug/kg/min via INTRAVENOUS

## 2024-08-21 MED ORDER — SODIUM CHLORIDE 0.9 % IV SOLN: INTRAVENOUS | Status: DC

## 2024-08-21 MED ORDER — SODIUM CHLORIDE 0.9 % IV SOLN
INTRAVENOUS | Status: AC | PRN
  Administered 2024-08-21: 500 mL via INTRAMUSCULAR

## 2024-08-21 NOTE — Discharge Instructions (Signed)

## 2024-08-21 NOTE — Transfer of Care (Signed)
 Immediate Anesthesia Transfer of Care Note  Patient: Michael Guzman  Procedure(s) Performed: COLONOSCOPY POLYPECTOMY, INTESTINE  Patient Location: PACU and Endoscopy Unit  Anesthesia Type:MAC  Level of Consciousness: awake  Airway & Oxygen Therapy: Patient Spontanous Breathing and Patient connected to face mask oxygen  Post-op Assessment: Report given to RN, Post -op Vital signs reviewed and stable, and Patient moving all extremities  Post vital signs: Reviewed and stable  Last Vitals:  Vitals Value Taken Time  BP 96/45 08/21/24 10:24  Temp    Pulse 69 08/21/24 10:26  Resp 15 08/21/24 10:26  SpO2 100 % 08/21/24 10:26  Vitals shown include unfiled device data.  Last Pain:  Vitals:   08/21/24 0908  TempSrc: Temporal  PainSc: 0-No pain         Complications: No notable events documented.

## 2024-08-21 NOTE — Anesthesia Preprocedure Evaluation (Addendum)
 Anesthesia Evaluation  Patient identified by MRN, date of birth, ID band Patient awake    Reviewed: Allergy & Precautions, NPO status , Patient's Chart, lab work & pertinent test results, reviewed documented beta blocker date and time   Airway Mallampati: III  TM Distance: >3 FB Neck ROM: Full    Dental  (+) Dental Advisory Given, Poor Dentition, Chipped, Missing,    Pulmonary sleep apnea , former smoker   Pulmonary exam normal breath sounds clear to auscultation       Cardiovascular hypertension, Pt. on medications and Pt. on home beta blockers + CAD and + Cardiac Stents  Normal cardiovascular exam Rhythm:Regular Rate:Normal     Neuro/Psych  PSYCHIATRIC DISORDERS  Depression     Neuromuscular disease    GI/Hepatic Neg liver ROS,GERD  Medicated,,Adenomatous duodenal polyp   Endo/Other  diabetes, Type 2, Insulin  Dependent, Oral Hypoglycemic Agents    Renal/GU Renal InsufficiencyRenal disease   Prostate cancer s/p radiation     Musculoskeletal  (+) Arthritis ,    Abdominal   Peds  Hematology negative hematology ROS (+)   Anesthesia Other Findings Day of surgery medications reviewed with the patient.  Reproductive/Obstetrics                              Anesthesia Physical Anesthesia Plan  ASA: 3  Anesthesia Plan: MAC   Post-op Pain Management: Minimal or no pain anticipated   Induction: Intravenous  PONV Risk Score and Plan: 1 and TIVA and Treatment may vary due to age or medical condition  Airway Management Planned: Natural Airway and Simple Face Mask  Additional Equipment:   Intra-op Plan:   Post-operative Plan:   Informed Consent: I have reviewed the patients History and Physical, chart, labs and discussed the procedure including the risks, benefits and alternatives for the proposed anesthesia with the patient or authorized representative who has indicated his/her  understanding and acceptance.     Dental advisory given  Plan Discussed with: CRNA  Anesthesia Plan Comments:          Anesthesia Quick Evaluation

## 2024-08-21 NOTE — Anesthesia Procedure Notes (Signed)
 Procedure Name: MAC Date/Time: 08/21/2024 9:45 AM  Performed by: Memory Armida LABOR, CRNAPre-anesthesia Checklist: Patient identified, Emergency Drugs available, Suction available, Patient being monitored and Timeout performed Patient Re-evaluated:Patient Re-evaluated prior to induction Oxygen Delivery Method: Simple face mask Placement Confirmation: CO2 detector Tube secured with: Tape Dental Injury: Teeth and Oropharynx as per pre-operative assessment

## 2024-08-21 NOTE — Op Note (Addendum)
 [facility] Patient Name: Michael Guzman Procedure Date: 08/21/2024 MRN: [id number] Attending MD: Aloha Finner , MD, [id number] Date of Birth: [birth year] CSN: [id number] Age: 78 Admit Type: Outpatient Procedure:                Colonoscopy Indications:              Surveillance: History of piecemeal removal adenoma                            on last colonoscopy (< 3 yrs) Providers:                Aloha Finner, MD, Randall Lines, RN, Jasmine                            Petiford, Technician, Armida LABOR. [place], CRNA Referring MD:              Medicines:                Monitored Anesthesia Care Complications:            No immediate complications. Estimated Blood Loss:     Estimated blood loss was minimal. Procedure:                Pre-Anesthesia Assessment:                           - Prior to the procedure, a History and Physical                            was performed, and patient medications and                            allergies were reviewed. The patient's tolerance of                            previous anesthesia was also reviewed. The risks                            and benefits of the procedure and the sedation                            options and risks were discussed with the patient.                            All questions were answered, and informed consent                            was obtained. Prior Anticoagulants: The patient has                            taken no anticoagulant or antiplatelet agents                            except for aspirin . ASA Grade Assessment: III - A  patient with severe systemic disease. After                            reviewing the risks and benefits, the patient was                            deemed in satisfactory condition to undergo the                            procedure.                           After obtaining informed consent, the colonoscope                             was passed under direct vision. Throughout the                            procedure, the patient's blood pressure, pulse, and                            oxygen saturations were monitored continuously. The                            CF-HQ190L ([id number] Olympus colonoscope was                            introduced through the anus and advanced to the the                            cecum, identified by appendiceal orifice and                            ileocecal valve. The colonoscopy was performed                            without difficulty. The patient tolerated the                            procedure. The quality of the bowel preparation was                            adequate. The ileocecal valve, appendiceal orifice,                            and rectum were photographed. Scope In: 9:54:50 AM Scope Out: 10:17:20 AM Scope Withdrawal Time: 0 hours 17 minutes 31 seconds  Total Procedure Duration: 0 hours 22 minutes 30 seconds  Findings:      The digital rectal exam findings include hemorrhoids. Pertinent       negatives include no palpable rectal lesions.      The colon (entire examined portion) revealed moderately excessive       looping. Advancing the scope required changing the patient's position,       using manual pressure, straightening and shortening the scope to  obtain       bowel loop reduction and using scope torsion.      A 5 mm polyp was found in the cecum. The polyp was sessile. The polyp       was removed with a cold snare. Resection and retrieval were complete.      A medium post mucosectomy scar was found in the transverse colon. There       was no evidence of the previous polyp still being present at this time.       Tattoo was noted in the region.      Normal mucosa was found in the entire colon.      Non-bleeding non-thrombosed internal hemorrhoids were found during       retroflexion, during perianal exam and during digital exam. The       hemorrhoids were Grade  II (internal hemorrhoids that prolapse but reduce       spontaneously). Impression:               - Hemorrhoids found on digital rectal exam.                           - There was significant looping of the colon.                           - One 5 mm polyp in the cecum, removed with a cold                            snare. Resected and retrieved.                           - Post mucosectomy scar in the transverse colon.                           - Normal mucosa in the entire examined colon                            otherwise.                           - Non-bleeding non-thrombosed internal hemorrhoids. Moderate Sedation:      Not Applicable - Patient had care per Anesthesia. Recommendation:           - The patient will be observed post-procedure,                            until all discharge criteria are met.                           - Discharge patient to home.                           - Patient has a contact number available for                            emergencies. The signs and symptoms of potential  delayed complications were discussed with the                            patient. Return to normal activities tomorrow.                            Written discharge instructions were provided to the                            patient.                           - High fiber diet.                           - Use FiberCon 1-2 tablets PO daily.                           - Continue present medications.                           - Await pathology results.                           - Repeat colonoscopy in 3 years for surveillance no                            matter pathology with history of previous advanced                            adenoma resection. Can be with primary GI at that                            point (also pending your health at the time as you                            will be 78 years of age).                           - The findings and  recommendations were discussed                            with the patient.                           - The findings and recommendations were discussed                            with the patient's family. Procedure Code(s):        --- Professional ---                           [id number], Colonoscopy, flexible; with removal of                            tumor(s), polyp(s),  or other lesion(s) by snare                            technique Diagnosis Code(s):        --- Professional ---                           Z86.010, Personal history of colonic polyps                           K64.1, Second degree hemorrhoids                           D12.0, Benign neoplasm of cecum                           Z98.890, Other specified postprocedural states CPT copyright 2022 American Medical Association. All rights reserved. The codes documented in this report are preliminary and upon coder review may  be revised to meet current compliance requirements. Aloha Finner, MD 08/21/2024 10:36:10 AM Number of Addenda: 0

## 2024-08-21 NOTE — H&P (Signed)
 GASTROENTEROLOGY PROCEDURE H&P NOTE   Primary Care Physician: [name], [name]  HPI: Michael Guzman is a 78 y.o. male   Past Medical History:  Diagnosis Date   Arthritis    CAD (coronary artery disease)    a.s/p DES to mid LAD and OM2 06/2012.   Chronic back pain    Chronically dry eyes    Diabetes mellitus without complication (HCC)    TYPE 2    DKA (diabetic ketoacidoses) 01/30/2017   Elevated PSA    being monitored   Fatty liver    GERD (gastroesophageal reflux disease)    Hx of radiation therapy    prostate , [facility]    Hypertension    Hypertriglyceridemia    Intermediate coronary syndrome (HCC) 07/26/2012   Nausea vomiting and diarrhea 11/11/2023   Neuromuscular disorder (HCC)    neuropathy feet and legs   Prostate cancer (HCC)    RADTIATION    Past Surgical History:  Procedure Laterality Date   APPENDECTOMY     CARDIAC SURGERY  ~2017   3 stent placed.   COLONOSCOPY  02/12/2010   [facility]   COLONOSCOPY WITH PROPOFOL  N/A 09/21/2022   Procedure: COLONOSCOPY WITH PROPOFOL ;  Surgeon: Mansouraty, Aloha Raddle., MD;  Location: [facility];  Service: Gastroenterology;  Laterality: N/A;   ENDOSCOPIC MUCOSAL RESECTION N/A 09/21/2022   Procedure: ENDOSCOPIC MUCOSAL RESECTION;  Surgeon: Wilhelmenia Aloha Raddle., MD;  Location: [facility];  Service: Gastroenterology;  Laterality: N/A;   HEMOSTASIS CLIP PLACEMENT  09/21/2022   Procedure: HEMOSTASIS CLIP PLACEMENT;  Surgeon: Wilhelmenia Aloha Raddle., MD;  Location: [facility];  Service: Gastroenterology;;   LEFT HEART CATHETERIZATION WITH CORONARY ANGIOGRAM N/A 06/27/2012   Procedure: LEFT HEART CATHETERIZATION WITH CORONARY ANGIOGRAM;  Surgeon: Oneil Parchment, MD;  Location: [facility];  Service: Cardiovascular;  Laterality: N/A;   LEFT HEART CATHETERIZATION WITH CORONARY ANGIOGRAM N/A 07/25/2012   Procedure: LEFT HEART CATHETERIZATION WITH CORONARY ANGIOGRAM;  Surgeon: Wilbert JONELLE Bihari, MD;  Location: [facility]  CATH LAB;  Service: Cardiovascular;  Laterality: N/A;   PROSTATE BIOPSY     RADIOLOGY WITH ANESTHESIA N/A 12/03/2015   Procedure: MRI LUMBAR SPINE;  Surgeon: Medication Radiologist, MD;  Location: [facility] OR;  Service: Radiology;  Laterality: N/A;   SUBMUCOSAL LIFTING INJECTION  09/21/2022   Procedure: SUBMUCOSAL LIFTING INJECTION;  Surgeon: Wilhelmenia Aloha Raddle., MD;  Location: [facility];  Service: Gastroenterology;;   TOTAL HIP ARTHROPLASTY Right 04/28/2019   Procedure: TOTAL HIP ARTHROPLASTY ANTERIOR APPROACH;  Surgeon: Yvone Rush, MD;  Location: [facility];  Service: Orthopedics;  Laterality: Right;   No current facility-administered medications for this encounter.   No current facility-administered medications for this encounter. No Known Allergies Family History  Problem Relation Age of Onset   Diabetes Sister    Stomach cancer Neg Hx    Colon cancer Neg Hx    Esophageal cancer Neg Hx    Inflammatory bowel disease Neg Hx    Liver disease Neg Hx    Pancreatic cancer Neg Hx    Rectal cancer Neg Hx    Social History   Socioeconomic History   Marital status: Married    Spouse name: Not on file   Number of children: 4   Years of education: 2 y colleg   Highest education level: Not on file  Occupational History   Occupation: SUPERVISOR    Employer: [employer]   Occupation: retired  Tobacco Use   Smoking status: Former    Current  packs/day: 0.00    Average packs/day: 0.8 packs/day for 52.9 years (39.6 ttl pk-yrs)    Types: Cigarettes    Start date: 50    Quit date: 07/27/2011    Years since quitting: 13.0   Smokeless tobacco: Never  Vaping Use   Vaping status: Never Used  Substance and Sexual Activity   Alcohol use: Not Currently    Comment: qut 20 years ago per patient    Drug use: No   Sexual activity: Not Currently  Other Topics Concern   Not on file  Social History Narrative   Works as    Social Drivers of Corporate Investment Banker Strain: Low  Risk  (12/09/2023)   Overall Financial Resource Strain (CARDIA)    Difficulty of Paying Living Expenses: Not hard at all  Food Insecurity: No Food Insecurity (03/13/2024)   Hunger Vital Sign    Worried About Running Out of Food in the Last Year: Never true    Ran Out of Food in the Last Year: Never true  Transportation Needs: No Transportation Needs (03/13/2024)   PRAPARE - Administrator, Civil Service (Medical): No    Lack of Transportation (Non-Medical): No  Physical Activity: Sufficiently Active (12/09/2023)   Exercise Vital Sign    Days of Exercise per Week: 5 days    Minutes of Exercise per Session: 30 min  Stress: No Stress Concern Present (12/09/2023)   [employer] of Occupational Health - Occupational Stress Questionnaire    Feeling of Stress : Not at all  Social Connections: Moderately Integrated (03/06/2024)   Social Connection and Isolation Panel    Frequency of Communication with Friends and Family: More than three times a week    Frequency of Social Gatherings with Friends and Family: More than three times a week    Attends Religious Services: 1 to 4 times per year    Active Member of [employer] or Organizations: No    Attends Banker Meetings: 1 to 4 times per year    Marital Status: Widowed  Intimate Partner Violence: Not At Risk (03/13/2024)   Humiliation, Afraid, Rape, and Kick questionnaire    Fear of Current or Ex-Partner: No    Emotionally Abused: No    Physically Abused: No    Sexually Abused: No    Physical Exam: Today's Vitals   08/15/24 1125  Weight: 93 kg   Body mass index is 29.42 kg/m. GEN: NAD EYE: Sclerae anicteric ENT: MMM CV: Non-tachycardic GI: Soft, NT/ND NEURO:  Alert & Oriented x 3  Lab Results: No results for input(s): WBC, HGB, HCT, PLT in the last 72 hours. BMET No results for input(s): NA, K, CL, CO2, GLUCOSE, BUN, CREATININE, CALCIUM  in the last 72 hours. LFT No results for  input(s): PROT, ALBUMIN, AST, ALT, ALKPHOS, BILITOT, BILIDIR, IBILI in the last 72 hours. PT/INR No results for input(s): LABPROT, INR in the last 72 hours.   Impression / Plan: This is a 78 y.o.male who presents for Colonoscopy for surveillance of previous TC TA post EMR in 2024.  The risks and benefits of endoscopic evaluation/treatment were discussed with the patient and/or family; these include but are not limited to the risk of perforation, infection, bleeding, missed lesions, lack of diagnosis, severe illness requiring hospitalization, as well as anesthesia and sedation related illnesses.  The patient's history has been reviewed, patient examined, no change in status, and deemed stable for procedure.  The patient and/or family was provided an opportunity  to ask questions and all were answered.  The patient and/or family is agreeable to proceed.    Aloha Finner, MD [facility] Office # [phone]

## 2024-08-22 ENCOUNTER — Ambulatory Visit: Payer: Self-pay | Admitting: Gastroenterology

## 2024-08-22 LAB — SURGICAL PATHOLOGY

## 2024-08-22 NOTE — Patient Instructions (Signed)

## 2024-08-22 NOTE — Anesthesia Postprocedure Evaluation (Signed)
 Anesthesia Post Note  Patient: Michael Guzman  Procedure(s) Performed: COLONOSCOPY POLYPECTOMY, INTESTINE     Patient location during evaluation: Endoscopy Anesthesia Type: MAC Level of consciousness: oriented, awake and alert and awake Pain management: pain level controlled Vital Signs Assessment: post-procedure vital signs reviewed and stable Respiratory status: spontaneous breathing, nonlabored ventilation, respiratory function stable and patient connected to nasal cannula oxygen Cardiovascular status: blood pressure returned to baseline and stable Postop Assessment: no headache, no backache and no apparent nausea or vomiting Anesthetic complications: no   No notable events documented.  Last Vitals:  Vitals:   08/21/24 1031 08/21/24 1039  BP: (!) 102/52 (!) 117/56  Pulse: 66 66  Resp: 14 19  Temp: 36.6 C   SpO2: 100% 100%    Last Pain:  Vitals:   08/21/24 1036  TempSrc:   PainSc: 0-No pain                 Garnette FORBES Skillern

## 2024-08-23 ENCOUNTER — Encounter: Payer: Self-pay | Admitting: Nurse Practitioner

## 2024-08-23 ENCOUNTER — Ambulatory Visit: Admitting: Nurse Practitioner

## 2024-08-23 VITALS — BP 122/78 | HR 66 | Ht 70.0 in | Wt 204.4 lb

## 2024-08-23 DIAGNOSIS — E782 Mixed hyperlipidemia: Secondary | ICD-10-CM

## 2024-08-23 DIAGNOSIS — I10 Essential (primary) hypertension: Secondary | ICD-10-CM

## 2024-08-23 DIAGNOSIS — Z7984 Long term (current) use of oral hypoglycemic drugs: Secondary | ICD-10-CM

## 2024-08-23 DIAGNOSIS — Z794 Long term (current) use of insulin: Secondary | ICD-10-CM

## 2024-08-23 DIAGNOSIS — E119 Type 2 diabetes mellitus without complications: Secondary | ICD-10-CM

## 2024-08-23 MED ORDER — METFORMIN HCL ER 500 MG PO TB24: 1000.0000 mg | ORAL_TABLET | Freq: Every day | ORAL | Status: AC

## 2024-08-23 NOTE — Progress Notes (Signed)
 Endocrinology Consult Note       08/23/2024, 3:41 PM   Subjective:    Patient ID: Michael Guzman, male    DOB: Jul 15, 1946.  Michael Guzman is being seen in consultation for management of currently uncontrolled symptomatic diabetes requested by  [name], [name].   Past Medical History:  Diagnosis Date   Arthritis    CAD (coronary artery disease)    a.s/p DES to mid LAD and OM2 06/2012.   Chronic back pain    Chronically dry eyes    Diabetes mellitus without complication (HCC)    TYPE 2    DKA (diabetic ketoacidoses) 01/30/2017   Elevated PSA    being monitored   Fatty liver    GERD (gastroesophageal reflux disease)    Hx of radiation therapy    prostate , [facility]    Hypertension    Hypertriglyceridemia    Intermediate coronary syndrome (HCC) 07/26/2012   Nausea vomiting and diarrhea 11/11/2023   Neuromuscular disorder (HCC)    neuropathy feet and legs   Prostate cancer (HCC)    RADTIATION     Past Surgical History:  Procedure Laterality Date   APPENDECTOMY     CARDIAC SURGERY  ~2017   3 stent placed.   COLONOSCOPY  02/12/2010   Buccini   COLONOSCOPY WITH PROPOFOL  N/A 09/21/2022   Procedure: COLONOSCOPY WITH PROPOFOL ;  Surgeon: Wilhelmenia Aloha Raddle., MD;  Location: [facility];  Service: Gastroenterology;  Laterality: N/A;   ENDOSCOPIC MUCOSAL RESECTION N/A 09/21/2022   Procedure: ENDOSCOPIC MUCOSAL RESECTION;  Surgeon: Wilhelmenia Aloha Raddle., MD;  Location: [facility];  Service: Gastroenterology;  Laterality: N/A;   HEMOSTASIS CLIP PLACEMENT  09/21/2022   Procedure: HEMOSTASIS CLIP PLACEMENT;  Surgeon: Wilhelmenia Aloha Raddle., MD;  Location: [facility];  Service: Gastroenterology;;   LEFT HEART CATHETERIZATION WITH CORONARY ANGIOGRAM N/A 06/27/2012   Procedure: LEFT HEART CATHETERIZATION WITH CORONARY ANGIOGRAM;  Surgeon: Oneil Parchment, MD;  Location: [facility] C[facility] LAB;   Service: Cardiovascular;  Laterality: N/A;   LEFT HEART CATHETERIZATION WITH CORONARY ANGIOGRAM N/A 07/25/2012   Procedure: LEFT HEART CATHETERIZATION WITH CORONARY ANGIOGRAM;  Surgeon: Wilbert JONELLE Bihari, MD;  Location: [facility]ATH [facility];  Service: Cardiovascular;  Laterality: N/A;   PROSTATE BIOPSY     RADIOLOGY WITH ANESTHESIA N/A 12/03/2015   Procedure: MRI LUMBAR SPINE;  Surgeon: Medication Radiologist, MD;  Location: [facility];  Service: Radiology;  Laterality: N/A;   SUBMUCOSAL LIFTING INJECTION  09/21/2022   Procedure: SUBMUCOSAL LIFTING INJECTION;  Surgeon: Wilhelmenia Aloha Raddle., MD;  Location: [facility];  Service: Gastroenterology;;   TOTAL HIP ARTHROPLASTY Right 04/28/2019   Procedure: TOTAL HIP ARTHROPLASTY ANTERIOR APPROACH;  Surgeon: Yvone Rush, MD;  Location: [facility];  Service: Orthopedics;  Laterality: Right;    Social History   Socioeconomic History   Marital status: Married    Spouse name: Not on file   Number of children: 4   Years of education: 2 y colleg   Highest education level: Not on file  Occupational History   Occupation: SUPERVISOR    Employer: [employer]   Occupation: retired  Tobacco Use   Smoking status: Former    Current packs/day:  0.00    Average packs/day: 0.8 packs/day for 52.9 years (39.6 ttl pk-yrs)    Types: Cigarettes    Start date: 69    Quit date: 07/27/2011    Years since quitting: 13.0   Smokeless tobacco: Never  Vaping Use   Vaping status: Never Used  Substance and Sexual Activity   Alcohol use: Not Currently    Comment: qut 20 years ago per patient    Drug use: No   Sexual activity: Not Currently  Other Topics Concern   Not on file  Social History Narrative   Works as    Social Drivers of Corporate Investment Banker Strain: Low Risk  (12/09/2023)   Overall Financial Resource Strain (CARDIA)    Difficulty of Paying Living Expenses: Not hard at all  Food Insecurity: No Food Insecurity (03/13/2024)   Hunger Vital Sign     Worried About Running Out of Food in the Last Year: Never true    Ran Out of Food in the Last Year: Never true  Transportation Needs: No Transportation Needs (03/13/2024)   PRAPARE - Administrator, Civil Service (Medical): No    Lack of Transportation (Non-Medical): No  Physical Activity: Sufficiently Active (12/09/2023)   Exercise Vital Sign    Days of Exercise per Week: 5 days    Minutes of Exercise per Session: 30 min  Stress: No Stress Concern Present (12/09/2023)   [employer] of Occupational Health - Occupational Stress Questionnaire    Feeling of Stress : Not at all  Social Connections: Moderately Integrated (03/06/2024)   Social Connection and Isolation Panel    Frequency of Communication with Friends and Family: More than three times a week    Frequency of Social Gatherings with Friends and Family: More than three times a week    Attends Religious Services: 1 to 4 times per year    Active Member of [employer] or Organizations: No    Attends Banker Meetings: 1 to 4 times per year    Marital Status: Widowed    Family History  Problem Relation Age of Onset   Diabetes Sister    Stomach cancer Neg Hx    Colon cancer Neg Hx    Esophageal cancer Neg Hx    Inflammatory bowel disease Neg Hx    Liver disease Neg Hx    Pancreatic cancer Neg Hx    Rectal cancer Neg Hx     Outpatient Encounter Medications as of 08/23/2024  Medication Sig   Ascorbic Acid (VITAMIN C) 1000 MG tablet Take 1,000 mg by mouth in the morning.   aspirin  EC 81 MG tablet Take 1 tablet (81 mg total) by mouth daily.   atorvastatin  (LIPITOR ) 10 MG tablet Take 1 tablet (10 mg total) by mouth daily.   B Complex-C (SUPER B COMPLEX PO) Take 1 tablet by mouth in the morning.   Blood Glucose Monitoring Suppl DEVI 1 each by Does not apply route in the morning, at noon, and at bedtime. May substitute to any manufacturer covered by patient's insurance.   Cholecalciferol  (VITAMIN D3) 250 MCG  (10000 UT) capsule Take 10,000 Units by mouth in the morning.   Continuous Glucose Sensor (FREESTYLE LIBRE 3 PLUS SENSOR) MISC Use to monitor blood sugar continuously. Change sensor every 15 days.   gabapentin  (NEURONTIN ) 800 MG tablet TAKE 1 TABLET BY MOUTH 3 TO 4 TIMES DAILY FOR PAIN, HOT FLASHES AND SWEATS (Patient taking differently: TAKE 1 TABLET BY MOUTH 3  TO 4 TIMES DAILY FOR PAIN, HOT FLASHES AND SWEATS 08/23/24  - patient reports that he is taking 1 in the morning , and 2 in the afternoon.)   glycopyrrolate  (ROBINUL ) 2 MG tablet Take 1 tablet (2 mg total) by mouth 3 (three) times daily.   isosorbide  mononitrate (IMDUR ) 30 MG 24 hr tablet Take 1 tablet by mouth once daily   Lancets (ONETOUCH DELICA PLUS LANCET33G) MISC CHECK BLOOD SUGAR 3 TIMES  DAILY   latanoprost  (XALATAN ) 0.005 % ophthalmic solution Place 1 drop into both eyes at bedtime.   Magnesium  500 MG TABS Take 500 mg by mouth in the morning.   metoCLOPramide  (REGLAN ) 10 MG tablet Take 1 tablet 20-30 minutes before drinking colonoscopy prep.   metoprolol  succinate (TOPROL -XL) 25 MG 24 hr tablet TAKE ONE-HALF TABLET BY MOUTH  DAILY   montelukast  (SINGULAIR ) 10 MG tablet TAKE 1 TABLET BY MOUTH DAILY FOR ALLERGIES   nitroGLYCERIN  (NITROSTAT ) 0.4 MG SL tablet DISSOLVE 1 TABLET UNDER THE  TONGUE EVERY 5 MINUTES AS NEEDED FOR CHEST PAIN. MAX OF 3 TABLETS IN 15 MINUTES. CALL 911 IF PAIN  PERSISTS. (Patient taking differently: as needed. DISSOLVE 1 TABLET UNDER THE  TONGUE EVERY 5 MINUTES AS NEEDED FOR CHEST PAIN. MAX OF 3 TABLETS IN 15 MINUTES. CALL 911 IF PAIN  PERSISTS.)   pantoprazole  (PROTONIX ) 40 MG tablet TAKE 1 TABLET BY MOUTH DAILY TO  PREVENT HEARTBURN AND  INDIGESTION   promethazine  (PHENERGAN ) 25 MG tablet Take 1 tablet (25 mg total) by mouth every 8 (eight) hours as needed for nausea or vomiting.   tamsulosin  (FLOMAX ) 0.4 MG CAPS capsule Take 0.4 mg by mouth at bedtime.   Vitamin E 450 MG (1000 UT) CAPS Take 450 mg by mouth in  the morning.   zinc gluconate 50 MG tablet Take 50 mg by mouth in the morning.   [DISCONTINUED] insulin  NPH-regular Human (70-30) 100 UNIT/ML injection Inject 50 Units into the skin. Patient states that he injects 50 units about twice a week.   [DISCONTINUED] Insulin  Pen Needle (NOVOFINE) 30G X 8 MM MISC Inject 10 each into the skin as needed.   [DISCONTINUED] Insulin  Syringes, Disposable, U-100 1 ML MISC 50 Units by Does not apply route 2 (two) times daily. inject 50 units into skin 2 x /day   [DISCONTINUED] metFORMIN  (GLUCOPHAGE -XR) 500 MG 24 hr tablet TAKE 2 TABLETS BY MOUTH  TWICE DAILY WITH MEALS FOR  DIABETES   glycopyrrolate  (ROBINUL ) 2 MG tablet Take  1 tablet  3 x / day for Excessive Sweating  TAKE ONE TABLET BY                                                 MOUTH   metFORMIN  (GLUCOPHAGE -XR) 500 MG 24 hr tablet Take 2 tablets (1,000 mg total) by mouth daily with breakfast. TAKE 2 TABLETS BY MOUTH  TWICE DAILY WITH MEALS FOR  DIABETES   [DISCONTINUED] Insulin  Degludec (TRESIBA) 100 UNIT/ML SOLN Inject 20 Units into the skin daily. (Patient not taking: Reported on 07/06/2024)   [DISCONTINUED] Semaglutide ,0.25 or 0.5MG /DOS, (OZEMPIC , 0.25 OR 0.5 MG/DOSE,) 2 MG/3ML SOPN Inject 0.25 mg into the skin once a week. 0.25mg  once weekly for 4 weeks, then increase to 0.5mg  once weekly (Patient not taking: Reported on 08/23/2024)   No facility-administered encounter medications on file as of 08/23/2024.  ALLERGIES: No Known Allergies  VACCINATION STATUS: Immunization History  Administered Date(s) Administered   INFLUENZA, HIGH DOSE SEASONAL PF 06/20/2015, 06/23/2016, 06/14/2017, 06/14/2019, 07/01/2020, 07/21/2021, 08/03/2022, 08/17/2023   Influenza Split 06/26/2012   Influenza-Unspecified 06/14/2013, 06/25/2014, 06/03/2018   PFIZER(Purple Top)SARS-COV-2 Vaccination 10/28/2019, 11/22/2019, 06/26/2020   Pneumococcal Conjugate-13 04/23/2016   Pneumococcal Polysaccharide-23 06/14/2017    Pneumococcal-Unspecified 06/16/2010   Td 09/17/2003   Tdap 11/26/2013, 12/09/2023   Zoster Recombinant(Shingrix ) 02/28/2024   Zoster, Live 07/12/2012    Diabetes He presents for his initial diabetic visit. He has type 2 diabetes mellitus. Onset time: diagnosed at approx age of 24. His disease course has been improving. Hypoglycemia symptoms include nervousness/anxiousness, sweats and tremors. Associated symptoms include foot paresthesias. Hypoglycemia complications include required assistance (in distant past). Symptoms are stable. Diabetic complications include heart disease, nephropathy and peripheral neuropathy. Risk factors for coronary artery disease include diabetes mellitus, dyslipidemia, family history, male sex, hypertension and sedentary lifestyle. Current diabetic treatment includes insulin  injections and oral agent (monotherapy). He is compliant with treatment some of the time. His weight is fluctuating minimally. He is following a generally unhealthy diet. When asked about meal planning, he reported none. He has not had a previous visit with a dietitian. He rarely participates in exercise. His overall blood glucose range is 140-180 mg/dl. (He presents today for his consultation with his CGM showing near target glycemic profile overall.  His most recent A1c on 9/17 was 8.3%.  He drinks gatorade/water  (mixed after hospitalization for low sodium levels), coffee in the morning, and occasionally a diet coke.  He eats 2 meals per day, skipping lunch most days.  He snacks occasionally.  He does not engage in routine physical activity but does have a stationary bike he can start using.  He is UTD on eye exam, sees podiatry routinely.  Analysis of his CGM shows TIR 51%, TAR 49%, TBR 0% with a GMI of 7.6%.  He does not take his insulin  unless his glucose is high as he is prone to hypoglycemia and is fearful of this due to EMS being called previously.  He notes he has not taken his insulin  in the last  week.) An ACE inhibitor/angiotensin II receptor blocker is not being taken. He does not see a podiatrist.Eye exam is current.     Review of systems  Constitutional: + Minimally fluctuating body weight, current Body mass index is 29.33 kg/m., no fatigue, no subjective hyperthermia, no subjective hypothermia Eyes: no blurry vision, no xerophthalmia ENT: no sore throat, no nodules palpated in throat, no dysphagia/odynophagia, no hoarseness Cardiovascular: no chest pain, no shortness of breath, no palpitations, no leg swelling Respiratory: no cough, no shortness of breath Gastrointestinal: no nausea/vomiting/diarrhea Musculoskeletal: no muscle/joint aches, walks with cane Skin: no rashes, no hyperemia Neurological: no tremors, no numbness, no tingling, no dizziness Psychiatric: no depression, no anxiety  Objective:     BP 122/78 (BP Location: Left Arm, Patient Position: Sitting, Cuff Size: Large)   Pulse 66   Ht 5' 10 (1.778 m)   Wt 204 lb 6.4 oz (92.7 kg)   BMI 29.33 kg/m   Wt Readings from Last 3 Encounters:  08/23/24 204 lb 6.4 oz (92.7 kg)  08/21/24 205 lb 0.4 oz (93 kg)  07/06/24 205 lb 9.6 oz (93.3 kg)     BP Readings from Last 3 Encounters:  08/23/24 122/78  08/21/24 (!) 117/56  07/06/24 128/72     Physical Exam- Limited  Constitutional:  Body mass index is 29.33 kg/m. ,  not in acute distress, normal state of mind Eyes:  EOMI, no exophthalmos Neck: Supple Cardiovascular: RRR, no murmurs, rubs, or gallops, no edema Respiratory: Adequate breathing efforts, no crackles, rales, rhonchi, or wheezing Musculoskeletal: no gross deformities, strength intact in all four extremities, no gross restriction of joint movements, walks with cane Skin:  no rashes, no hyperemia Neurological: no tremor with outstretched hands   Diabetic Foot Exam - Simple   No data filed      CMP ( most recent) CMP     Component Value Date/Time   NA 137 05/31/2024 1015   K 4.4  05/31/2024 1015   CL 101 05/31/2024 1015   CO2 30 05/31/2024 1015   GLUCOSE 196 (H) 05/31/2024 1015   BUN 12 05/31/2024 1015   CREATININE 0.99 05/31/2024 1015   CALCIUM  9.7 05/31/2024 1015   PROT 7.0 05/31/2024 1015   ALBUMIN 3.6 03/10/2024 0435   AST 17 05/31/2024 1015   ALT 17 05/31/2024 1015   ALKPHOS 66 03/05/2024 1820   BILITOT 0.3 05/31/2024 1015   GFR 57.06 (L) 06/16/2022 1539   EGFR 79 05/31/2024 1015   GFRNONAA >60 03/10/2024 0435   GFRNONAA 68 01/21/2021 0923     Diabetic Labs (most recent): Lab Results  Component Value Date   HGBA1C 8.3 (H) 05/31/2024   HGBA1C 7.1 (H) 02/25/2024   HGBA1C 8.2 (H) 11/22/2023   MICROALBUR 0.4 05/06/2023   MICROALBUR 0.2 09/01/2022   MICROALBUR 1.0 04/16/2021     Lipid Panel ( most recent) Lipid Panel     Component Value Date/Time   CHOL 142 02/25/2024 1111   TRIG 100 02/25/2024 1111   HDL 60 02/25/2024 1111   CHOLHDL 2.4 02/25/2024 1111   VLDL 59 (H) 01/15/2017 1118   LDLCALC 63 02/25/2024 1111   LDLDIRECT 45.7 10/30/2013 0743      Lab Results  Component Value Date   TSH 0.507 03/05/2024   TSH 0.596 11/11/2023   TSH 0.82 05/06/2023   TSH 0.93 12/04/2022   TSH 1.09 08/03/2022   TSH 0.93 04/30/2022   TSH 0.75 01/28/2022   TSH 0.67 10/22/2021   TSH 0.78 07/21/2021   TSH 0.86 04/16/2021   FREET4 0.60 05/12/2016   FREET4 1.02 06/26/2012           Assessment & Plan:   1) Type 2 diabetes mellitus without complication, with long-term current use of insulin  (HCC) (Primary)  He presents today for his consultation with his CGM showing near target glycemic profile overall.  His most recent A1c on 9/17 was 8.3%.  He drinks gatorade/water  (mixed after hospitalization for low sodium levels), coffee in the morning, and occasionally a diet coke.  He eats 2 meals per day, skipping lunch most days.  He snacks occasionally.  He does not engage in routine physical activity but does have a stationary bike he can start using.  He  is UTD on eye exam, sees podiatry routinely.  Analysis of his CGM shows TIR 51%, TAR 49%, TBR 0% with a GMI of 7.6%.  He does not take his insulin  unless his glucose is high as he is prone to hypoglycemia and is fearful of this due to EMS being called previously.  He notes he has not taken his insulin  in the last week.  - Michael Guzman has currently uncontrolled symptomatic type 2 DM since 78 years of age, with most recent A1c of 8.3 %.   -Recent labs reviewed.  - I had a long discussion with  him about the progressive nature of diabetes and the pathology behind its complications. -his diabetes is complicated by neuropathy, mild CKD, CAD and he remains at a high risk for more acute and chronic complications which include CAD, CVA, CKD, retinopathy, and neuropathy. These are all discussed in detail with him.  The following Lifestyle Medicine recommendations according to American College of Lifestyle Medicine [facility]) were discussed and offered to patient and he agrees to start the journey:  A. Whole Foods, Plant-based plate comprising of fruits and vegetables, plant-based proteins, whole-grain carbohydrates was discussed in detail with the patient.   A list for source of those nutrients were also provided to the patient.  Patient will use only water  or unsweetened tea for hydration. B.  The need to stay away from risky substances including alcohol, smoking; obtaining 7 to 9 hours of restorative sleep, at least 150 minutes of moderate intensity exercise weekly, the importance of healthy social connections,  and stress reduction techniques were discussed. C.  A full color page of  Calorie density of various food groups per pound showing examples of each food groups was provided to the patient.  - I have counseled him on diet and weight management by adopting a carbohydrate restricted/protein rich diet. Patient is encouraged to switch to unprocessed or minimally processed complex starch and increased  protein intake (animal or plant source), fruits, and vegetables. -  he is advised to stick to a routine mealtimes to eat 3 meals a day and avoid unnecessary snacks (to snack only to correct hypoglycemia).   - he acknowledges that there is a room for improvement in his food and drink choices. - Suggestion is made for him to avoid simple carbohydrates from his diet including Cakes, Sweet Desserts, Ice Cream, Soda (diet and regular), Sweet Tea, Candies, Chips, Cookies, Store Bought Juices, Alcohol in Excess of 1-2 drinks a day, Artificial Sweeteners, Coffee Creamer, and Sugar-free Products. This will help patient to have more stable blood glucose profile and potentially avoid unintended weight gain.  - he will be scheduled with Penny Crumpton, RDN, CDE for diabetes education.  - I have approached him with the following individualized plan to manage his diabetes and patient agrees:   -He is advised to continue Metformin  1000 mg ER but cut down to once daily (to help preserve kidney function).  I advised him not to restart the insulin  just yet, as I am hoping with some dietary adjustments, he will not need it.  He has done fairly well without it the last week.    -Due to age and comorbidities, goal A1c for him would be around 7.5% to avoid potential for hypoglycemia.  -he is encouraged to start/continue monitoring glucose 4 times daily, before meals and before bed (using his CGM), and to call the clinic if he has readings less than 70 or above 300 for 3 tests in a row.  He was previously seeing PharmD who switched him to Tresiba and Ozempic  (from patient assistance). Patient tried this regimen but had drops in glucose and did not want to be on either of these medications moving forward, thus he switched back to his premixed insulin .  Per chart review, he does not take that insulin  on a routine basis as prescribed, often times skipping his evening dose due to fear of hypoglycemia.  Apparently he has had  to have EMS assist for hypoglycemia in the past.  - he is warned not to take insulin  without proper monitoring per orders. - Adjustment  parameters are given to him for hypo and hyperglycemia in writing.  - Specific targets for  A1c; LDL, HDL, and Triglycerides were discussed with the patient.  2) Blood Pressure /Hypertension:  his blood pressure is controlled to target.   he is advised to continue his current medications as prescribed by PCP.  3) Lipids/Hyperlipidemia:    Review of his recent lipid panel from 02/25/24 showed controlled LDL at 63 .  he is advised to continue Lipitor  10 mg daily at bedtime.  Side effects and precautions discussed with him.  4)  Weight/Diet:  his Body mass index is 29.33 kg/m.  -  clearly complicating his diabetes care.   he is a candidate for weight loss. I discussed with him the fact that loss of 5 - 10% of his  current body weight will have the most impact on his diabetes management.  Exercise, and detailed carbohydrates information provided  -  detailed on discharge instructions.  5) Chronic Care/Health Maintenance: -he is not on ACEI/ARB and is on Statin medications and is encouraged to initiate and continue to follow up with Ophthalmology, Dentist, Podiatrist at least yearly or according to recommendations, and advised to stay away from smoking. I have recommended yearly flu vaccine and pneumonia vaccine at least every 5 years; moderate intensity exercise for up to 150 minutes weekly; and sleep for at least 7 hours a day.  - he is advised to maintain close follow up with [name], [name] for primary care needs, as well as his other providers for optimal and coordinated care.   - Time spent in this patient care: 60 min, which was spent in counseling him about his diabetes and the rest reviewing his blood glucose logs, discussing his hypoglycemia and hyperglycemia episodes, reviewing his current and previous labs/studies (including abstraction from other  facilities) and medications doses and developing a long term treatment plan based on the latest standards of care/guidelines; and documenting his care.    Please refer to Patient Instructions for Blood Glucose Monitoring and Insulin /Medications Dosing Guide in media tab for additional information. Please also refer to Patient Self Inventory in the Media tab for reviewed elements of pertinent patient history.  Michael Guzman participated in the discussions, expressed understanding, and voiced agreement with the above plans.  All questions were answered to his satisfaction. he is encouraged to contact clinic should he have any questions or concerns prior to his return visit.     Follow up plan: - Return in about 4 months (around 12/22/2024) for No previsit labs, Diabetes F/U with A1c in office, Bring meter and logs.    Benton Rio, [facility] [address] [place], [place] [address] Phone: [phone] Fax: [phone]  08/23/2024, 3:41 PM

## 2024-08-29 ENCOUNTER — Other Ambulatory Visit

## 2024-08-30 ENCOUNTER — Other Ambulatory Visit

## 2024-08-31 ENCOUNTER — Encounter: Payer: Self-pay | Admitting: Family Medicine

## 2024-08-31 ENCOUNTER — Ambulatory Visit (INDEPENDENT_AMBULATORY_CARE_PROVIDER_SITE_OTHER): Admitting: Family Medicine

## 2024-08-31 VITALS — BP 118/76 | HR 66 | Ht 70.0 in | Wt 205.6 lb

## 2024-08-31 DIAGNOSIS — D649 Anemia, unspecified: Secondary | ICD-10-CM | POA: Diagnosis not present

## 2024-08-31 DIAGNOSIS — E1169 Type 2 diabetes mellitus with other specified complication: Secondary | ICD-10-CM | POA: Diagnosis not present

## 2024-08-31 DIAGNOSIS — I2510 Atherosclerotic heart disease of native coronary artery without angina pectoris: Secondary | ICD-10-CM | POA: Diagnosis not present

## 2024-08-31 DIAGNOSIS — E1140 Type 2 diabetes mellitus with diabetic neuropathy, unspecified: Secondary | ICD-10-CM

## 2024-08-31 DIAGNOSIS — F339 Major depressive disorder, recurrent, unspecified: Secondary | ICD-10-CM

## 2024-08-31 DIAGNOSIS — Z23 Encounter for immunization: Secondary | ICD-10-CM | POA: Diagnosis not present

## 2024-08-31 DIAGNOSIS — Z8546 Personal history of malignant neoplasm of prostate: Secondary | ICD-10-CM | POA: Insufficient documentation

## 2024-08-31 DIAGNOSIS — K219 Gastro-esophageal reflux disease without esophagitis: Secondary | ICD-10-CM

## 2024-08-31 DIAGNOSIS — K760 Fatty (change of) liver, not elsewhere classified: Secondary | ICD-10-CM | POA: Diagnosis not present

## 2024-08-31 DIAGNOSIS — Z794 Long term (current) use of insulin: Secondary | ICD-10-CM | POA: Diagnosis not present

## 2024-08-31 DIAGNOSIS — E1159 Type 2 diabetes mellitus with other circulatory complications: Secondary | ICD-10-CM

## 2024-08-31 DIAGNOSIS — E785 Hyperlipidemia, unspecified: Secondary | ICD-10-CM | POA: Diagnosis not present

## 2024-08-31 DIAGNOSIS — I10 Essential (primary) hypertension: Secondary | ICD-10-CM | POA: Diagnosis not present

## 2024-08-31 DIAGNOSIS — Z0001 Encounter for general adult medical examination with abnormal findings: Secondary | ICD-10-CM

## 2024-08-31 DIAGNOSIS — Z0000 Encounter for general adult medical examination without abnormal findings: Secondary | ICD-10-CM | POA: Insufficient documentation

## 2024-08-31 DIAGNOSIS — E559 Vitamin D deficiency, unspecified: Secondary | ICD-10-CM

## 2024-08-31 NOTE — Addendum Note (Signed)
 Addended by: CORINNA BRISKER R on: 08/31/2024 12:53 PM   Modules accepted: Orders

## 2024-08-31 NOTE — Assessment & Plan Note (Signed)
 Well controlled on Pantoprazole  40mg  daily Elevated HOB if needed and avoid lying down 2-3 hours after eating, avoid coffee, alcohol, chocolate, fatty foods, citrus, carbonated beverages, spicy foods, late meals, and smoking. Return to office if symptoms return or worsen and seek medical care for difficulty swallowing, bleeding, anemia, weight loss, or recurrent vomiting.

## 2024-08-31 NOTE — Assessment & Plan Note (Signed)
 CBC today, recent colonoscopy unremarkable

## 2024-08-31 NOTE — Assessment & Plan Note (Signed)
 CMP today

## 2024-08-31 NOTE — Progress Notes (Signed)
 Acute Office Visit  Patient ID: Michael Guzman, male    DOB: [birth year], 78 y.o.   MRN: [id number]  PCP: [name], [name]  Chief Complaint  Patient presents with   Medical Management of Chronic Issues    3 month f/u missed lab appt .will need labs today      Subjective:     HPI  Michael Guzman is here today for chronic condition management and CPE PMH includes DM, CAD, steatosis, HTN, HLD, prostate CA, GERD Has recently established care with Endocrinology. Followed by Cardiology, GI, Urology  DM: last A1c 8.3%, goal 7.5%, on Metformin  ER 1000mg  daily, continues to take 70/30 as needed despite discontinuation with Endo, no hypoglycemia  CAD: on bASA, followed by cardiology, labs today, BP well controlled, DM is challenging due to compliance issues with medication regimen, denies chest pain  Steatosis: labs today  HTN: followed by cardiology, on Imdur  30mg  daily, Metoprolol  25mg  daily Denies chest pain, palpitations, recurrent headaches, vision changes, lightheadedness, dizziness, dyspnea on exertion, or swelling of extremities.  HLD: on atorvastatin  10mg  daily Lipid Panel     Component Value Date/Time   CHOL 142 02/25/2024 1111   TRIG 100 02/25/2024 1111   HDL 60 02/25/2024 1111   CHOLHDL 2.4 02/25/2024 1111   VLDL 59 (H) 01/15/2017 1118   LDLCALC 63 02/25/2024 1111   LDLDIRECT 45.7 10/30/2013 0743   Prostate CA: followed by urology, s/p prostatectomy  GERD: on Protonix  40mg  daily Denies hematemesis, melena, hematochezia, occult blood in stool, iron deficiency anemia, anorexia, unexplained weight loss, dysphagia, odynophagia, persistent vomiting  Anemia: recently colonoscopy normal, labs today  CKD2: avoiding NSAIDs, staying hydrated    Discussed the use of AI scribe software for clinical note transcription with the patient, who gave verbal consent to proceed.  History of Present Illness Michael Guzman is a 78 year old male with diabetes who for chronic  condition management.  He has been experiencing difficulty managing his blood sugar levels since a recent change in his diabetes management plan. He reports that his endocrinologist wanted him to stop using insulin , but he continues to use it as needed due to persistently high blood sugar levels, particularly postprandially. His diet includes grain bread, chicken breast, an apple, and an orange, which have resulted in elevated blood sugar levels overnight and into the morning. He continues to drink sugar-free Gatorade.  He is currently taking metformin  once daily in the evening. He does not take Ozempic  or Tresiba. For insulin , he uses a 70/30 mix, trying to maintain a dose of 40 to 50 units per day, but adjusts based on his blood sugar readings. He does not take insulin  consistently and has not experienced any hypoglycemic episodes recently.  He underwent a colonoscopy recently; he recalls being told that everything was fine except for the presence of hemorrhoids and one polyp. He reports having had a prostatectomy ten years ago. He continues to follow up with urology and cardiology. He underwent a stress test recently and recalls being told that the results were okay; he has not used nitroglycerin  since his last cardiac event.  He experiences occasional headaches that resolve on his own or with Tylenol . No chest pain, vision changes, leg swelling, shortness of breath, or dizziness. He has experienced some numbness and tingling in his feet but manages his foot care independently. He was previously advised to see a specialist for circulation issues but was told there was no need after further review.  He received  a flu vaccine recently and is due for his second shingles vaccine.   Review of Systems  Constitutional: Negative.   HENT: Negative.    Eyes: Negative.   Respiratory: Negative.    Cardiovascular: Negative.   Gastrointestinal: Negative.   Genitourinary: Negative.   Musculoskeletal:  Negative.   Skin: Negative.   Neurological:  Positive for headaches.  Endo/Heme/Allergies: Negative.   Psychiatric/Behavioral: Negative.    All other systems reviewed and are negative.   Past Medical History:  Diagnosis Date   Arthritis    CAD (coronary artery disease)    a.s/p DES to mid LAD and OM2 06/2012.   Chronic back pain    Chronically dry eyes    Diabetes mellitus without complication (HCC)    TYPE 2    DKA (diabetic ketoacidoses) 01/30/2017   Elevated PSA    being monitored   Fatty liver    GERD (gastroesophageal reflux disease)    Hx of radiation therapy    prostate , [facility]    Hypertension    Hypertriglyceridemia    Intermediate coronary syndrome (HCC) 07/26/2012   Nausea vomiting and diarrhea 11/11/2023   Neuromuscular disorder (HCC)    neuropathy feet and legs   Prostate cancer (HCC)    RADTIATION     Past Surgical History:  Procedure Laterality Date   APPENDECTOMY     CARDIAC SURGERY  ~2017   3 stent placed.   COLONOSCOPY  02/12/2010   Buccini   COLONOSCOPY N/A 08/21/2024   Procedure: COLONOSCOPY;  Surgeon: Mansouraty, Aloha Raddle., MD;  Location: [facility];  Service: Gastroenterology;  Laterality: N/A;   COLONOSCOPY WITH PROPOFOL  N/A 09/21/2022   Procedure: COLONOSCOPY WITH PROPOFOL ;  Surgeon: Mansouraty, Aloha Raddle., MD;  Location: [facility];  Service: Gastroenterology;  Laterality: N/A;   ENDOSCOPIC MUCOSAL RESECTION N/A 09/21/2022   Procedure: ENDOSCOPIC MUCOSAL RESECTION;  Surgeon: Wilhelmenia Aloha Raddle., MD;  Location: [facility];  Service: Gastroenterology;  Laterality: N/A;   HEMOSTASIS CLIP PLACEMENT  09/21/2022   Procedure: HEMOSTASIS CLIP PLACEMENT;  Surgeon: Wilhelmenia Aloha Raddle., MD;  Location: [facility];  Service: Gastroenterology;;   LEFT HEART CATHETERIZATION WITH CORONARY ANGIOGRAM N/A 06/27/2012   Procedure: LEFT HEART CATHETERIZATION WITH CORONARY ANGIOGRAM;  Surgeon: Oneil Parchment, MD;  Location: [facility];   Service: Cardiovascular;  Laterality: N/A;   LEFT HEART CATHETERIZATION WITH CORONARY ANGIOGRAM N/A 07/25/2012   Procedure: LEFT HEART CATHETERIZATION WITH CORONARY ANGIOGRAM;  Surgeon: Wilbert JONELLE Bihari, MD;  Location: [facility];  Service: Cardiovascular;  Laterality: N/A;   POLYPECTOMY  08/21/2024   Procedure: POLYPECTOMY, INTESTINE;  Surgeon: Wilhelmenia Aloha Raddle., MD;  Location: [facility];  Service: Gastroenterology;;   PROSTATE BIOPSY     RADIOLOGY WITH ANESTHESIA N/A 12/03/2015   Procedure: MRI LUMBAR SPINE;  Surgeon: Medication Radiologist, MD;  Location: [facility];  Service: Radiology;  Laterality: N/A;   SUBMUCOSAL LIFTING INJECTION  09/21/2022   Procedure: SUBMUCOSAL LIFTING INJECTION;  Surgeon: Wilhelmenia Aloha Raddle., MD;  Location: [facility];  Service: Gastroenterology;;   TOTAL HIP ARTHROPLASTY Right 04/28/2019   Procedure: TOTAL HIP ARTHROPLASTY ANTERIOR APPROACH;  Surgeon: Yvone Rush, MD;  Location: [facility];  Service: Orthopedics;  Laterality: Right;    Outpatient Medications Prior to Visit  Medication Sig Dispense Refill   Ascorbic Acid (VITAMIN C) 1000 MG tablet Take 1,000 mg by mouth in the morning.     aspirin  EC 81 MG tablet Take 1 tablet (81 mg total) by mouth daily. 90 tablet 3  atorvastatin  (LIPITOR ) 10 MG tablet Take 1 tablet (10 mg total) by mouth daily. 100 tablet 1   B Complex-C (SUPER B COMPLEX PO) Take 1 tablet by mouth in the morning.     Blood Glucose Monitoring Suppl DEVI 1 each by Does not apply route in the morning, at noon, and at bedtime. May substitute to any manufacturer covered by patient's insurance. 1 each 0   Cholecalciferol  (VITAMIN D3) 250 MCG (10000 UT) capsule Take 10,000 Units by mouth in the morning.     Continuous Glucose Sensor (FREESTYLE LIBRE 3 PLUS SENSOR) MISC Use to monitor blood sugar continuously. Change sensor every 15 days. 2 each 6   gabapentin  (NEURONTIN ) 800 MG tablet TAKE 1 TABLET BY MOUTH 3 TO 4 TIMES DAILY FOR PAIN, HOT FLASHES  AND SWEATS (Patient taking differently: TAKE 1 TABLET BY MOUTH 3 TO 4 TIMES DAILY FOR PAIN, HOT FLASHES AND SWEATS 08/23/24  - patient reports that he is taking 1 in the morning , and 2 in the afternoon.) 400 tablet 2   glycopyrrolate  (ROBINUL ) 2 MG tablet Take 1 tablet (2 mg total) by mouth 3 (three) times daily. 90 tablet 11   isosorbide  mononitrate (IMDUR ) 30 MG 24 hr tablet Take 1 tablet by mouth once daily 90 tablet 0   Lancets (ONETOUCH DELICA PLUS LANCET33G) MISC CHECK BLOOD SUGAR 3 TIMES  DAILY 300 each 3   latanoprost (XALATAN) 0.005 % ophthalmic solution Place 1 drop into both eyes at bedtime.     Magnesium  500 MG TABS Take 500 mg by mouth in the morning.     metFORMIN  (GLUCOPHAGE -XR) 500 MG 24 hr tablet Take 2 tablets (1,000 mg total) by mouth daily with breakfast. TAKE 2 TABLETS BY MOUTH  TWICE DAILY WITH MEALS FOR  DIABETES     metoCLOPramide  (REGLAN ) 10 MG tablet Take 1 tablet 20-30 minutes before drinking colonoscopy prep. 2 tablet 0   metoprolol  succinate (TOPROL -XL) 25 MG 24 hr tablet TAKE ONE-HALF TABLET BY MOUTH  DAILY 50 tablet 1   montelukast  (SINGULAIR ) 10 MG tablet TAKE 1 TABLET BY MOUTH DAILY FOR ALLERGIES 100 tablet 2   nitroGLYCERIN  (NITROSTAT ) 0.4 MG SL tablet DISSOLVE 1 TABLET UNDER THE  TONGUE EVERY 5 MINUTES AS NEEDED FOR CHEST PAIN. MAX OF 3 TABLETS IN 15 MINUTES. CALL 911 IF PAIN  PERSISTS. (Patient taking differently: as needed. DISSOLVE 1 TABLET UNDER THE  TONGUE EVERY 5 MINUTES AS NEEDED FOR CHEST PAIN. MAX OF 3 TABLETS IN 15 MINUTES. CALL 911 IF PAIN  PERSISTS.) 125 tablet 2   pantoprazole  (PROTONIX ) 40 MG tablet TAKE 1 TABLET BY MOUTH DAILY TO  PREVENT HEARTBURN AND  INDIGESTION 100 tablet 2   promethazine  (PHENERGAN ) 25 MG tablet Take 1 tablet (25 mg total) by mouth every 8 (eight) hours as needed for nausea or vomiting. 20 tablet 0   tamsulosin  (FLOMAX ) 0.4 MG CAPS capsule Take 0.4 mg by mouth at bedtime.     Vitamin E 450 MG (1000 UT) CAPS Take 450 mg by mouth  in the morning.     zinc gluconate 50 MG tablet Take 50 mg by mouth in the morning.     glycopyrrolate  (ROBINUL ) 2 MG tablet Take  1 tablet  3 x / day for Excessive Sweating  TAKE ONE TABLET BY  MOUTH (Patient not taking: Reported on 08/31/2024) 270 tablet 3   No facility-administered medications prior to visit.    Allergies[1]     Objective:    BP 118/76 (BP Location: Left Arm, Patient Position: Sitting, Cuff Size: Large)   Pulse 66   Ht 5' 10 (1.778 m)   Wt 205 lb 9.6 oz (93.3 kg)   SpO2 97%   BMI 29.50 kg/m  BP Readings from Last 3 Encounters:  08/31/24 118/76  08/23/24 122/78  08/21/24 (!) 117/56   Wt Readings from Last 3 Encounters:  08/31/24 205 lb 9.6 oz (93.3 kg)  08/23/24 204 lb 6.4 oz (92.7 kg)  08/21/24 205 lb 0.4 oz (93 kg)      Physical Exam Vitals and nursing note reviewed.  Constitutional:      Appearance: Normal appearance. He is normal weight.  HENT:     Head: Normocephalic and atraumatic.  Eyes:     Extraocular Movements: Extraocular movements intact.     Right eye: Normal extraocular motion and no nystagmus.     Left eye: Normal extraocular motion and no nystagmus.     Conjunctiva/sclera: Conjunctivae normal.     Pupils: Pupils are equal, round, and reactive to light.  Cardiovascular:     Rate and Rhythm: Normal rate and regular rhythm.     Pulses: Normal pulses.     Heart sounds: Normal heart sounds.  Pulmonary:     Effort: Pulmonary effort is normal.     Breath sounds: Normal breath sounds.  Abdominal:     General: Bowel sounds are normal.     Palpations: Abdomen is soft.  Genitourinary:    Comments: Deferred using shared decision making Musculoskeletal:        General: Normal range of motion.     Cervical back: Normal range of motion and neck supple.  Skin:    General: Skin is warm and dry.     Capillary Refill: Capillary refill takes less than 2 seconds.  Neurological:     General: No  focal deficit present.     Mental Status: He is alert. Mental status is at baseline.  Psychiatric:        Mood and Affect: Mood normal.        Speech: Speech normal.        Behavior: Behavior normal.        Thought Content: Thought content normal.        Cognition and Memory: Cognition and memory normal.        Judgment: Judgment normal.       No results found for any visits on 08/31/24.     Assessment & Plan:   Problem List Items Addressed This Visit       Cardiovascular and Mediastinum   Hypertension (1991)   Type 2 diabetes mellitus with vascular disease (HCC)   Relevant Orders   Microalbumin / creatinine urine ratio   CAD in native artery   Continue bASA, keep appts with cardiology, working on DM control, BP and HLD well controlled        Digestive   GERD   Well controlled on Pantoprazole  40mg  daily Elevated HOB if needed and avoid lying down 2-3 hours after eating, avoid coffee, alcohol, chocolate, fatty foods, citrus, carbonated beverages, spicy foods, late meals, and smoking. Return to office if symptoms return or worsen and seek medical care for difficulty swallowing, bleeding, anemia, weight loss, or recurrent vomiting.        Fatty infiltration  of liver   CMP today        Endocrine   Hyperlipidemia associated with type 2 diabetes mellitus (HCC)   Continue atorvastatin .  I recommend consuming a heart healthy diet such as Mediterranean diet or DASH diet with whole grains, fruits, vegetable, fish, lean meats, nuts, and olive oil. Limit sweets and processed foods. I also encourage moderate intensity exercise 150 minutes weekly. This is 3-5 times weekly for 30-50 minutes each session. Goal should be pace of 3 miles/hours, or walking 1.5 miles in 30 minutes. The 10-year ASCVD risk score (Arnett DK, et al., 2019) is: 31%         Other   Vitamin D  deficiency   Vitamin d  level today      Depression, recurrent      06/19/2024    2:26 PM 03/15/2024   10:54 AM  03/15/2024   10:17 AM 03/13/2024   10:14 AM 02/28/2024   10:29 AM  Depression screen PHQ 2/9  Decreased Interest 0 1 1 0 1  Down, Depressed, Hopeless 0 0 0 0 0  PHQ - 2 Score 0 1 1 0 1  Altered sleeping 2 0   3  Tired, decreased energy 1 1   2   Change in appetite 0 0   1  Feeling bad or failure about yourself  0 0   0  Trouble concentrating 0 0   0  Moving slowly or fidgety/restless 0    0  Suicidal thoughts 0 0   0  PHQ-9 Score 3  2    7    Difficult doing work/chores Not difficult at all Not difficult at all   Not difficult at all     Data saved with a previous flowsheet row definition         Anemia   CBC today, recent colonoscopy unremarkable      History of prostate cancer   Physical exam, annual - Primary   Other Visit Diagnoses       Need for vaccination       Relevant Orders   Varicella-zoster vaccine IM (Completed)       Assessment and Plan Assessment & Plan Adult Wellness Visit Routine wellness visit with discussion on recent colonoscopy results, including hemorrhoids and one polyp. Blood pressure initially elevated but improved upon recheck. Recent flu vaccine administered. - Continue high fiber diet as recommended post-colonoscopy. - Continue current diabetes management plan.  Type 2 diabetes mellitus with vascular and neuropathic complications Diabetes management complicated by inconsistent insulin  use and dietary challenges. Currently on metformin  and insulin  70/30. No recent hypoglycemic episodes. Neuropathy in feet with some sensation intact. Recent colonoscopy showed one polyp and hemorrhoids. - Continue metformin  as prescribed. - Encouraged adherence to dietary recommendations. - Continue podiatry care for foot health.  Primary hypertension Blood pressure initially elevated but improved upon recheck. No symptoms of chest pain, headaches, vision changes, leg swelling, shortness of breath, or dizziness reported. - Continue current antihypertensive  regimen.  History of prostate cancer, post-prostatectomy Prostatectomy performed 10 years ago. No current prostate issues reported. - Continue regular follow-up with urology as needed.    No orders of the defined types were placed in this encounter.   Return in about 3 months (around 11/29/2024) for chronic follow-up with labs 1 week prior.  Jeoffrey GORMAN Barrio, [name]  [facility]      [1] No Known Allergies

## 2024-08-31 NOTE — Assessment & Plan Note (Signed)
 Continue bASA, keep appts with cardiology, working on DM control, BP and HLD well controlled

## 2024-08-31 NOTE — Assessment & Plan Note (Signed)
°    06/19/2024    2:26 PM 03/15/2024   10:54 AM 03/15/2024   10:17 AM 03/13/2024   10:14 AM 02/28/2024   10:29 AM  Depression screen PHQ 2/9  Decreased Interest 0 1 1 0 1  Down, Depressed, Hopeless 0 0 0 0 0  PHQ - 2 Score 0 1 1 0 1  Altered sleeping 2 0   3  Tired, decreased energy 1 1   2   Change in appetite 0 0   1  Feeling bad or failure about yourself  0 0   0  Trouble concentrating 0 0   0  Moving slowly or fidgety/restless 0    0  Suicidal thoughts 0 0   0  PHQ-9 Score 3  2    7    Difficult doing work/chores Not difficult at all Not difficult at all   Not difficult at all     Data saved with a previous flowsheet row definition

## 2024-08-31 NOTE — Assessment & Plan Note (Signed)
Vitamin d level today 

## 2024-08-31 NOTE — Assessment & Plan Note (Signed)
 Continue atorvastatin .  I recommend consuming a heart healthy diet such as Mediterranean diet or DASH diet with whole grains, fruits, vegetable, fish, lean meats, nuts, and olive oil. Limit sweets and processed foods. I also encourage moderate intensity exercise 150 minutes weekly. This is 3-5 times weekly for 30-50 minutes each session. Goal should be pace of 3 miles/hours, or walking 1.5 miles in 30 minutes. The 10-year ASCVD risk score (Arnett DK, et al., 2019) is: 31%

## 2024-09-13 NOTE — Progress Notes (Signed)
 Michael Guzman                                          MRN: [id number]   09/13/2024   The [facility] Quality Team Specialist reviewed this patient medical record for the purposes of chart review for care gap closure. The following were reviewed: chart review for care gap closure-kidney health evaluation for diabetes:eGFR  and uACR.    [facility] Quality Team

## 2024-09-21 ENCOUNTER — Other Ambulatory Visit: Payer: Self-pay | Admitting: Family Medicine

## 2024-09-21 ENCOUNTER — Other Ambulatory Visit

## 2024-09-21 DIAGNOSIS — E1159 Type 2 diabetes mellitus with other circulatory complications: Secondary | ICD-10-CM

## 2024-09-25 ENCOUNTER — Other Ambulatory Visit: Payer: Self-pay | Admitting: Family Medicine

## 2024-09-25 DIAGNOSIS — M150 Primary generalized (osteo)arthritis: Secondary | ICD-10-CM

## 2024-09-25 DIAGNOSIS — R61 Generalized hyperhidrosis: Secondary | ICD-10-CM

## 2024-10-17 ENCOUNTER — Other Ambulatory Visit (HOSPITAL_COMMUNITY): Payer: Self-pay

## 2024-10-19 ENCOUNTER — Other Ambulatory Visit: Payer: Self-pay | Admitting: Family Medicine

## 2024-10-19 DIAGNOSIS — E1159 Type 2 diabetes mellitus with other circulatory complications: Secondary | ICD-10-CM

## 2024-11-23 ENCOUNTER — Other Ambulatory Visit

## 2024-11-30 ENCOUNTER — Ambulatory Visit: Admitting: Family Medicine

## 2024-12-14 ENCOUNTER — Ambulatory Visit

## 2024-12-22 ENCOUNTER — Ambulatory Visit: Admitting: Nurse Practitioner
